# Patient Record
Sex: Female | Born: 1935
Health system: Southern US, Community
[De-identification: ages and names within clinical notes are randomized; demographics above are authoritative.]

## PROBLEM LIST (undated history)

## (undated) DIAGNOSIS — R5081 Fever presenting with conditions classified elsewhere: Secondary | ICD-10-CM

## (undated) DIAGNOSIS — A0472 Enterocolitis due to Clostridium difficile, not specified as recurrent: Secondary | ICD-10-CM

## (undated) DIAGNOSIS — N762 Acute vulvitis: Secondary | ICD-10-CM

## (undated) DIAGNOSIS — I10 Essential (primary) hypertension: Secondary | ICD-10-CM

## (undated) DIAGNOSIS — T4145XA Adverse effect of unspecified anesthetic, initial encounter: Secondary | ICD-10-CM

## (undated) DIAGNOSIS — M8580 Other specified disorders of bone density and structure, unspecified site: Secondary | ICD-10-CM

## (undated) DIAGNOSIS — T8859XA Other complications of anesthesia, initial encounter: Secondary | ICD-10-CM

## (undated) DIAGNOSIS — C569 Malignant neoplasm of unspecified ovary: Principal | ICD-10-CM

## (undated) DIAGNOSIS — T8489XA Other specified complication of internal orthopedic prosthetic devices, implants and grafts, initial encounter: Secondary | ICD-10-CM

## (undated) DIAGNOSIS — M810 Age-related osteoporosis without current pathological fracture: Secondary | ICD-10-CM

## (undated) DIAGNOSIS — M1990 Unspecified osteoarthritis, unspecified site: Secondary | ICD-10-CM

## (undated) DIAGNOSIS — D649 Anemia, unspecified: Secondary | ICD-10-CM

## (undated) DIAGNOSIS — Z5189 Encounter for other specified aftercare: Secondary | ICD-10-CM

## (undated) DIAGNOSIS — E871 Hypo-osmolality and hyponatremia: Secondary | ICD-10-CM

## (undated) DIAGNOSIS — E785 Hyperlipidemia, unspecified: Secondary | ICD-10-CM

## (undated) DIAGNOSIS — Z8719 Personal history of other diseases of the digestive system: Secondary | ICD-10-CM

## (undated) DIAGNOSIS — N289 Disorder of kidney and ureter, unspecified: Secondary | ICD-10-CM

## (undated) DIAGNOSIS — N952 Postmenopausal atrophic vaginitis: Secondary | ICD-10-CM

## (undated) DIAGNOSIS — B958 Unspecified staphylococcus as the cause of diseases classified elsewhere: Secondary | ICD-10-CM

## (undated) DIAGNOSIS — M86669 Other chronic osteomyelitis, unspecified tibia and fibula: Secondary | ICD-10-CM

## (undated) DIAGNOSIS — IMO0001 Reserved for inherently not codable concepts without codable children: Secondary | ICD-10-CM

## (undated) DIAGNOSIS — K219 Gastro-esophageal reflux disease without esophagitis: Secondary | ICD-10-CM

## (undated) DIAGNOSIS — R350 Frequency of micturition: Secondary | ICD-10-CM

## (undated) DIAGNOSIS — D709 Neutropenia, unspecified: Secondary | ICD-10-CM

## (undated) DIAGNOSIS — J189 Pneumonia, unspecified organism: Secondary | ICD-10-CM

## (undated) DIAGNOSIS — D62 Acute posthemorrhagic anemia: Secondary | ICD-10-CM

## (undated) DIAGNOSIS — M009 Pyogenic arthritis, unspecified: Secondary | ICD-10-CM

## (undated) HISTORY — DX: Essential (primary) hypertension: I10

## (undated) HISTORY — DX: Acute vulvitis: N76.2

## (undated) HISTORY — DX: Hypo-osmolality and hyponatremia: E87.1

## (undated) HISTORY — DX: Malignant neoplasm of unspecified ovary: C56.9

## (undated) HISTORY — DX: Neutropenia, unspecified: D70.9

## (undated) HISTORY — PX: OTHER SURGICAL HISTORY: SHX169

## (undated) HISTORY — DX: Frequency of micturition: R35.0

## (undated) HISTORY — DX: Other specified disorders of bone density and structure, unspecified site: M85.80

## (undated) HISTORY — DX: Pyogenic arthritis, unspecified: M00.9

## (undated) HISTORY — DX: Other chronic osteomyelitis, unspecified tibia and fibula: M86.669

## (undated) HISTORY — DX: Postmenopausal atrophic vaginitis: N95.2

## (undated) HISTORY — DX: Other specified complication of internal orthopedic prosthetic devices, implants and grafts, initial encounter: T84.89XA

## (undated) HISTORY — DX: Fever presenting with conditions classified elsewhere: R50.81

## (undated) HISTORY — PX: APPENDECTOMY: SHX54

## (undated) HISTORY — DX: Acute posthemorrhagic anemia: D62

## (undated) HISTORY — PX: TONSILLECTOMY: SUR1361

## (undated) HISTORY — DX: Hyperlipidemia, unspecified: E78.5

## (undated) HISTORY — DX: Unspecified staphylococcus as the cause of diseases classified elsewhere: B95.8

## (undated) HISTORY — DX: Gastro-esophageal reflux disease without esophagitis: K21.9

## (undated) HISTORY — DX: Age-related osteoporosis without current pathological fracture: M81.0

## (undated) HISTORY — PX: JOINT REPLACEMENT: SHX530

## (undated) HISTORY — DX: Enterocolitis due to Clostridium difficile, not specified as recurrent: A04.72

---

## 2000-06-26 ENCOUNTER — Other Ambulatory Visit: Admission: RE | Admit: 2000-06-26 | Discharge: 2000-06-26 | Payer: Self-pay | Admitting: Family Medicine

## 2000-11-04 ENCOUNTER — Ambulatory Visit (HOSPITAL_COMMUNITY): Admission: RE | Admit: 2000-11-04 | Discharge: 2000-11-04 | Payer: Self-pay | Admitting: Gastroenterology

## 2001-08-26 ENCOUNTER — Other Ambulatory Visit: Admission: RE | Admit: 2001-08-26 | Discharge: 2001-08-26 | Payer: Self-pay | Admitting: Family Medicine

## 2002-07-06 ENCOUNTER — Encounter: Admission: RE | Admit: 2002-07-06 | Discharge: 2002-10-04 | Payer: Self-pay | Admitting: Family Medicine

## 2002-09-05 ENCOUNTER — Other Ambulatory Visit: Admission: RE | Admit: 2002-09-05 | Discharge: 2002-09-05 | Payer: Self-pay | Admitting: Family Medicine

## 2002-10-26 ENCOUNTER — Encounter: Admission: RE | Admit: 2002-10-26 | Discharge: 2003-01-24 | Payer: Self-pay | Admitting: Family Medicine

## 2003-10-03 ENCOUNTER — Encounter: Admission: RE | Admit: 2003-10-03 | Discharge: 2003-10-03 | Payer: Self-pay | Admitting: Family Medicine

## 2003-11-28 ENCOUNTER — Encounter: Admission: RE | Admit: 2003-11-28 | Discharge: 2003-11-28 | Payer: Self-pay | Admitting: Family Medicine

## 2006-03-04 ENCOUNTER — Inpatient Hospital Stay (HOSPITAL_COMMUNITY): Admission: RE | Admit: 2006-03-04 | Discharge: 2006-03-11 | Payer: Self-pay | Admitting: Orthopedic Surgery

## 2008-02-11 DIAGNOSIS — B958 Unspecified staphylococcus as the cause of diseases classified elsewhere: Secondary | ICD-10-CM

## 2008-02-11 HISTORY — DX: Unspecified staphylococcus as the cause of diseases classified elsewhere: B95.8

## 2008-02-14 ENCOUNTER — Inpatient Hospital Stay (HOSPITAL_COMMUNITY): Admission: RE | Admit: 2008-02-14 | Discharge: 2008-02-17 | Payer: Self-pay | Admitting: Orthopedic Surgery

## 2008-07-07 ENCOUNTER — Ambulatory Visit (HOSPITAL_COMMUNITY): Admission: RE | Admit: 2008-07-07 | Discharge: 2008-07-07 | Payer: Self-pay | Admitting: Orthopedic Surgery

## 2008-08-09 ENCOUNTER — Inpatient Hospital Stay (HOSPITAL_COMMUNITY): Admission: RE | Admit: 2008-08-09 | Discharge: 2008-08-15 | Payer: Self-pay | Admitting: Orthopedic Surgery

## 2008-08-11 ENCOUNTER — Ambulatory Visit: Payer: Self-pay | Admitting: Infectious Diseases

## 2008-08-14 ENCOUNTER — Encounter (INDEPENDENT_AMBULATORY_CARE_PROVIDER_SITE_OTHER): Payer: Self-pay | Admitting: Gastroenterology

## 2008-08-28 ENCOUNTER — Encounter: Payer: Self-pay | Admitting: Infectious Diseases

## 2009-04-17 ENCOUNTER — Encounter: Payer: Self-pay | Admitting: Infectious Diseases

## 2009-04-23 ENCOUNTER — Ambulatory Visit: Payer: Self-pay | Admitting: Infectious Diseases

## 2009-04-23 DIAGNOSIS — M810 Age-related osteoporosis without current pathological fracture: Secondary | ICD-10-CM | POA: Insufficient documentation

## 2009-04-23 DIAGNOSIS — M86669 Other chronic osteomyelitis, unspecified tibia and fibula: Secondary | ICD-10-CM | POA: Insufficient documentation

## 2009-04-23 DIAGNOSIS — M949 Disorder of cartilage, unspecified: Secondary | ICD-10-CM

## 2009-04-23 DIAGNOSIS — M899 Disorder of bone, unspecified: Secondary | ICD-10-CM | POA: Insufficient documentation

## 2009-04-23 DIAGNOSIS — I10 Essential (primary) hypertension: Secondary | ICD-10-CM | POA: Insufficient documentation

## 2009-04-23 DIAGNOSIS — M1990 Unspecified osteoarthritis, unspecified site: Secondary | ICD-10-CM | POA: Insufficient documentation

## 2009-04-23 DIAGNOSIS — K219 Gastro-esophageal reflux disease without esophagitis: Secondary | ICD-10-CM | POA: Insufficient documentation

## 2009-04-23 DIAGNOSIS — E785 Hyperlipidemia, unspecified: Secondary | ICD-10-CM | POA: Insufficient documentation

## 2009-04-23 HISTORY — DX: Age-related osteoporosis without current pathological fracture: M81.0

## 2009-04-23 HISTORY — DX: Other chronic osteomyelitis, unspecified tibia and fibula: M86.669

## 2009-04-23 LAB — CONVERTED CEMR LAB
ALT: 10 units/L (ref 0–35)
AST: 17 units/L (ref 0–37)
Albumin: 3.8 g/dL (ref 3.5–5.2)
Alkaline Phosphatase: 58 units/L (ref 39–117)
BUN: 20 mg/dL (ref 6–23)
Basophils Absolute: 0 10*3/uL (ref 0.0–0.1)
Basophils Relative: 1 % (ref 0–1)
CO2: 22 meq/L (ref 19–32)
CRP: 3.4 mg/dL — ABNORMAL HIGH (ref <0.6)
Calcium: 9.4 mg/dL (ref 8.4–10.5)
Chloride: 101 meq/L (ref 96–112)
Creatinine, Ser: 0.91 mg/dL (ref 0.40–1.20)
Eosinophils Absolute: 0.3 10*3/uL (ref 0.0–0.7)
Eosinophils Relative: 4 % (ref 0–5)
Glucose, Bld: 105 mg/dL — ABNORMAL HIGH (ref 70–99)
HCT: 33.4 % — ABNORMAL LOW (ref 36.0–46.0)
Hemoglobin: 10.5 g/dL — ABNORMAL LOW (ref 12.0–15.0)
Lymphocytes Relative: 18 % (ref 12–46)
Lymphs Abs: 1.3 10*3/uL (ref 0.7–4.0)
MCHC: 31.4 g/dL (ref 30.0–36.0)
MCV: 77 fL — ABNORMAL LOW (ref >=78.0)
Monocytes Absolute: 0.7 10*3/uL (ref 0.1–1.0)
Monocytes Relative: 10 % (ref 3–12)
Neutro Abs: 4.8 10*3/uL (ref 1.7–7.7)
Neutrophils Relative %: 67 % (ref 43–77)
Platelets: 346 10*3/uL (ref 150–400)
Potassium: 5.1 meq/L (ref 3.5–5.3)
RBC: 4.34 M/uL (ref 3.87–5.11)
RDW: 17.8 % — ABNORMAL HIGH (ref 11.5–15.5)
Sed Rate: 52 mm/hr — ABNORMAL HIGH (ref 0–22)
Sodium: 135 meq/L (ref 135–145)
Total Bilirubin: 0.3 mg/dL (ref 0.3–1.2)
Total Protein: 7.4 g/dL (ref 6.0–8.3)
WBC: 7.1 10*3/uL (ref 4.0–10.5)

## 2009-04-27 ENCOUNTER — Encounter (INDEPENDENT_AMBULATORY_CARE_PROVIDER_SITE_OTHER): Payer: Self-pay | Admitting: *Deleted

## 2009-04-28 ENCOUNTER — Encounter: Admission: RE | Admit: 2009-04-28 | Discharge: 2009-04-28 | Payer: Self-pay | Admitting: Infectious Diseases

## 2009-05-07 ENCOUNTER — Inpatient Hospital Stay (HOSPITAL_COMMUNITY): Admission: EM | Admit: 2009-05-07 | Discharge: 2009-05-15 | Payer: Self-pay | Admitting: Emergency Medicine

## 2009-05-07 ENCOUNTER — Telehealth: Payer: Self-pay | Admitting: Infectious Diseases

## 2009-05-07 ENCOUNTER — Ambulatory Visit: Payer: Self-pay | Admitting: Infectious Diseases

## 2009-05-14 ENCOUNTER — Ambulatory Visit: Payer: Self-pay | Admitting: Infectious Disease

## 2009-05-20 ENCOUNTER — Emergency Department (HOSPITAL_COMMUNITY): Admission: EM | Admit: 2009-05-20 | Discharge: 2009-05-20 | Payer: Self-pay | Admitting: Emergency Medicine

## 2009-05-28 ENCOUNTER — Telehealth: Payer: Self-pay | Admitting: Infectious Disease

## 2009-05-28 DIAGNOSIS — M009 Pyogenic arthritis, unspecified: Secondary | ICD-10-CM

## 2009-05-28 DIAGNOSIS — T8489XA Other specified complication of internal orthopedic prosthetic devices, implants and grafts, initial encounter: Secondary | ICD-10-CM

## 2009-05-28 HISTORY — DX: Pyogenic arthritis, unspecified: M00.9

## 2009-05-28 HISTORY — DX: Other specified complication of internal orthopedic prosthetic devices, implants and grafts, initial encounter: T84.89XA

## 2009-05-31 ENCOUNTER — Telehealth: Payer: Self-pay | Admitting: Infectious Disease

## 2009-05-31 DIAGNOSIS — A0472 Enterocolitis due to Clostridium difficile, not specified as recurrent: Secondary | ICD-10-CM | POA: Insufficient documentation

## 2009-05-31 HISTORY — DX: Enterocolitis due to Clostridium difficile, not specified as recurrent: A04.72

## 2009-06-04 ENCOUNTER — Encounter: Payer: Self-pay | Admitting: Infectious Disease

## 2009-06-04 ENCOUNTER — Ambulatory Visit: Payer: Self-pay | Admitting: Infectious Diseases

## 2009-06-05 ENCOUNTER — Telehealth: Payer: Self-pay | Admitting: Infectious Disease

## 2009-06-11 ENCOUNTER — Encounter: Payer: Self-pay | Admitting: Infectious Disease

## 2009-06-11 ENCOUNTER — Encounter: Payer: Self-pay | Admitting: Internal Medicine

## 2009-06-13 ENCOUNTER — Encounter: Payer: Self-pay | Admitting: Infectious Disease

## 2009-06-19 ENCOUNTER — Encounter: Payer: Self-pay | Admitting: Infectious Disease

## 2009-06-27 ENCOUNTER — Encounter (INDEPENDENT_AMBULATORY_CARE_PROVIDER_SITE_OTHER): Payer: Self-pay | Admitting: Orthopedic Surgery

## 2009-06-27 ENCOUNTER — Inpatient Hospital Stay (HOSPITAL_COMMUNITY): Admission: RE | Admit: 2009-06-27 | Discharge: 2009-07-02 | Payer: Self-pay | Admitting: Orthopedic Surgery

## 2010-03-03 ENCOUNTER — Encounter: Payer: Self-pay | Admitting: Infectious Diseases

## 2010-03-03 ENCOUNTER — Encounter: Payer: Self-pay | Admitting: Endocrinology

## 2010-03-03 ENCOUNTER — Encounter: Payer: Self-pay | Admitting: Orthopedic Surgery

## 2010-03-12 NOTE — Progress Notes (Signed)
Summary: MRI/Lab results.  Phone Note Call from Patient   Caller: Spouse Summary of Call: Calling for MRI and lab results .Marland Kitchen  Pt is now in the emergency room at [facility]. Pt did not leave a phone number.  Initial call taken by: Tomasita Morrow RN,  May 07, 2009 9:53 AM

## 2010-03-12 NOTE — Progress Notes (Signed)
Summary: [facility] calling   Phone Note From Other Clinic   Caller: Mindi Junker at [facility] [phone] Summary of Call: Vanc trough 23.6 with dose of 1 gram every 12 hours.  Rec: dose change to 1.5 grams once daily. Please advise. Tomasita Morrow RN  June 05, 2009 4:59 PM sent to Dr Algis Liming via pager.   Follow-up for Phone Call        I spoke with them on Monday and they will adjust the dose. Thanks Follow-up by: Acey Lav MD,  June 06, 2009 11:45 PM

## 2010-03-12 NOTE — Letter (Signed)
Summary: Kishana Szwed  Deairra Stebbins   Imported By: Margie Billet 05/08/2009 11:27:57  _____________________________________________________________________  External Attachment:    Type:   Image     Comment:   External Document

## 2010-03-12 NOTE — Miscellaneous (Signed)
Summary: medication list update  Clinical Lists Changes  Medications: Added new medication of ALENDRONATE SODIUM 70 MG TABS (ALENDRONATE SODIUM) once weekly Added new medication of LOVAZA 1 GM CAPS (OMEGA-3-ACID ETHYL ESTERS) 2 capsules daily Added new medication of FERREX 150 150 MG CAPS (POLYSACCHARIDE IRON COMPLEX) 3 capsules daily Added new medication of LISINOPRIL 10 MG TABS (LISINOPRIL) half of 10 mg tablet Added new medication of NADOLOL 20 MG TABS (NADOLOL) half of a 20 mg tablet daily Added new medication of OMEPRAZOLE 20 MG CPDR (OMEPRAZOLE) 20 mg capsule daily Added new medication of ZOLPIDEM TARTRATE 10 MG TABS (ZOLPIDEM TARTRATE) half of 10 mg tablet for sleeplessness

## 2010-03-12 NOTE — Progress Notes (Signed)
Summary: Care Plan Oversight  Phone Note Outgoing Call   Call placed by: Acey Lav MD,  May 28, 2009 11:18 AM Details for Reason: Care Plan Oversight Summary of Call: [phone] (30 or more mins)  I have supervised home care and/or infusion therapy for this pt, including providing orders for care, review of labs and/or home health care plans, communicating with the home health care professionals and/or patient/caregivers to integrate current information into the medical treatment plan and/or adjust the medical therapy. This supervision has been provided for _32_minutes during the calendar month. Dates for this oversight April 4th thru May 3rd, 2011.   Initial call taken by: Acey Lav MD,  May 28, 2009 11:19 AM  New Problems: ARTHRITIS, SEPTIC (ICD-711.00) PROSTHETIC JOINT COMPLICATION (ICD-996.77)   New Problems: ARTHRITIS, SEPTIC (ICD-711.00) PROSTHETIC JOINT COMPLICATION (ICD-996.77)  Appended Document: Care Plan Oversight Care Plan oversight  99346(> 60 mins) I have supervised home care and/or infusion therapy for this pt, including providing orders for care, review of labs and/or home health care plans, communicating with the home health care professionals and/or patient/caregivers to integrate current information into the medical treatment plan and/or adjust the medical therapy. This supervision has been provided for during the calendar month. Dates for this oversight  05/14/09 thru 06/13/09  This replacest he prior charge as greater than used coordinating care

## 2010-03-12 NOTE — Progress Notes (Signed)
Summary: diarrhea  Phone Note Other Incoming   Caller: Dr Alusio's office Summary of Call: Nurse reporting  pt is having horrific diarrhea related to the Antibiotics.   Please advise.  Tomasita Morrow RN  May 31, 2009 9:32 AM   Follow-up for Phone Call        I had spent 30 minutes talking to the pt about her diarrhea and her surgery. Her husband will come on Monday to pickup a container to put a stool specimen in and have it checked for c diff toxins. I will also help to have her weekly wbc sent to me. I expect her diarrhea is from antibiotics themselves rather than c diff but I would rather look for c diff than ignore this possiblity. Follow-up by: Acey Lav MD,  June 02, 2009 3:40 PM  New Problems: DIARRHEA, ANTIBIOTIC ASSOCIATED (ICD-008.45)   New Problems: DIARRHEA, ANTIBIOTIC ASSOCIATED (ICD-008.45)

## 2010-03-12 NOTE — Miscellaneous (Signed)
Summary: HIPAA Restrictions  HIPAA Restrictions   Imported By: Florinda Marker 04/24/2009 09:52:45  _____________________________________________________________________  External Attachment:    Type:   Image     Comment:   External Document

## 2010-03-12 NOTE — Miscellaneous (Signed)
[facility]  [facility]: [facility]   Imported By: Florinda Marker 07/03/2009 16:33:12  _____________________________________________________________________  External Attachment:    Type:   Image     Comment:   External Document

## 2010-03-12 NOTE — Assessment & Plan Note (Signed)
Summary: hsfu need chart/inf left totlal knee/okperjh   CC:  hsfu.  History of Present Illness: 77 yo F with a history of osteoarthritis and L TKR 02-14-08. She returned with wound fluid collection 07-07-08 and had I & D. Her culture were negative. She returned again with a fluid collection and had I& D 08-09-08. She was d/c with 6 weeks of IV vanco and cefttriaxone as well as a vac. Her ESR was 49. Since has had redness around site of I and D where wound vac was. She has a skin dimple there but no open os. It cont to be tender to touch. no fevers or chills.   Preventive Screening-Counseling & Management  Alcohol-Tobacco     Alcohol drinks/day: 0     Smoking Status: never  Caffeine-Diet-Exercise     Caffeine use/day: coffee and tea occassionally     Does Patient Exercise: yes     Type of exercise: water walking     Exercise (avg: min/session): 30-60     Times/week: 3  Safety-Violence-Falls     Seat Belt Use: yes   Updated Prior Medication List: PROTONIX 40 MG PACK (PANTOPRAZOLE SODIUM) Take 1 tablet by mouth once a day CORGARD 20 MG TABS (NADOLOL) pt unsure of dose TYLENOL 325 MG TABS (ACETAMINOPHEN) prn  Current Allergies (reviewed today): ! PCN ! IODINE ! * SHELLFISH Past History:  Past Medical History: 1. Pretibial wound, status post irrigation and debridement left knee     and pretibial wound. Cx negative 2. New-onset rectal bleeding secondary to ischemic colitis diagnosed     by flexible sigmoidoscopy. 3. Mild acute blood loss anemia. 4. Postoperative diarrhea, Clostridium difficile negative. 5. History of shingles. 6. Cataracts. 7. Hypertension. 8. Hypercholesterolemia. 9.Reflux disease. 10.Past history of urinary tract infection. 11.Past history of cystitis. 12.Osteopenia. 13.Osteoporosis. 14.Postmenopausal. 15.Childhood illnesses of measles and mumps. 16.Past history of hyponatremia/syndrome of inappropriate antidiuretic     hormone following total knee in  January 2010.  Family History: father with parkinsons  Social History: Never Smoked Alcohol use-no  Review of Systems       states she has some residual difficulty with weight bearing. concerned that her leg has grown back crooked.   Vital Signs:  Patient profile:   77 year old female Height:      65 inches (165.10 cm) Weight:      185.3 pounds (84.23 kg) BMI:     30.95 Temp:     97.9 degrees F (36.61 degrees C) oral Pulse rate:   87 / minute BP sitting:   161 / 80  (left arm)  Vitals Entered By: Baxter Hire) (April 23, 2009 9:23 AM) CC: hsfu Pain Assessment Patient in pain? yes     Location: left knee Type: discomfort Onset of pain  discomfort pain when walking Nutritional Status BMI of > 30 = obese Nutritional Status Detail appetite is very good per patient  Does patient need assistance? Functional Status Self care Ambulation Normal   Physical Exam  General:  well-developed, well-nourished, and well-hydrated.   Eyes:  pupils equal, pupils round, and pupils reactive to light.   Mouth:  pharynx pink and moist and no exudates.   Neck:  no masses.   Lungs:  normal respiratory effort and normal breath sounds.   Heart:  normal rate, regular rhythm, and no murmur.   Abdomen:  soft, non-tender, and normal bowel sounds.   Extremities:  L knee is slightly warm, mildly tender, there is a small tissue defect  with fluid palpable beneath.    Current Medications (verified): 1)  Protonix 40 Mg Pack (Pantoprazole Sodium) .... Take 1 Tablet By Mouth Once A Day 2)  Corgard 20 Mg Tabs (Nadolol) .... Pt Unsure of Dose 3)  Tylenol 325 Mg Tabs (Acetaminophen) .... Prn 4)  Ferrex 150 150 Mg Caps (Polysaccharide Iron Complex) .... 3 Tabs By Mouth Once Daily  Allergies (verified): 1)  ! Pcn 2)  ! Iodine 3)  ! * Shellfish   Impression & Recommendations:  Problem # 1:  OSTEOMYELITIS, CHRONIC, LOWER LEG (ICD-730.16)  will set her up for a MRI of her LLE/knee. She  still has her prosthesis in place. Will verify with radiology that this is ok. Will also check her ESR, CBC, CRP, CMP today. Her knee has evidence of inflamation but does not appear grossly infected. SHe asks about having a nuclear study but these can be relative insensitive. will cont to take tylenol as neded. will have her back in 2 weeks.  Her updated medication list for this problem includes:    Tylenol 325 Mg Tabs (Acetaminophen) .Marland Kitchen... Prn  Orders: T-C-Reactive Protein [id number]) T-CBC w/Diff [id number]) T-Sed Rate (Automated) [id number]) T-Comprehensive Metabolic Panel [id number]) Consultation Level IV (28413) MRI without Contrast (MRI w/o Contrast)  Medications Added to Medication List This Visit: 1)  Protonix 40 Mg Pack (Pantoprazole sodium) .... Take 1 tablet by mouth once a day 2)  Corgard 20 Mg Tabs (Nadolol) .... Pt unsure of dose 3)  Tylenol 325 Mg Tabs (Acetaminophen) .... Prn 4)  Ferrex 150 150 Mg Caps (Polysaccharide iron complex) .... 3 tabs by mouth once daily Process Orders Check Orders Results:     [facility]: Check successful Tests Sent for requisitioning (April 23, 2009 10:22 AM):     04/23/2009: [facility] -- T-C-Reactive Protein [[id number]] (signed)     04/23/2009: [facility] -- T-CBC w/Diff [[id number]] (signed)     04/23/2009: [facility] -- T-Sed Rate (Automated) [[id number]] (signed)     04/23/2009: [facility] -- T-Comprehensive Metabolic Panel [phone] (signed)

## 2010-03-12 NOTE — Progress Notes (Signed)
Summary: questions about medications  Phone Note Call from Patient   Caller: Patient Call For: Patient  Summary of Call: Patient is getting labs every week with Genevieve Norlander and she had f/u appt with Dr. Despina Hick her ortho. Careers adviser. She is to see him again 1 week prior to surgery on 06/27/2009. She wants to know what would Dr. Daiva Eves like to do prior to and after surgery about her IV abx. She states that she would have a hard time getting to our office in her "shape" right now. Initial call taken by: Starleen Arms CMA,  May 28, 2009 11:47 AM  Follow-up for Phone Call        I would like her IV antibioitcs sto stop when she has completed the course. She needs to be ealvuated by ortho prior to reimplantation to make sure that she has no evidence or persistent infetion (fever, chills, increased pain etc). I can talk to Dr. Despina Hick as well if need be. He is more than capable Follow-up by: Acey Lav MD,  May 28, 2009 5:02 PM

## 2010-04-29 LAB — CBC
HCT: 23 % — ABNORMAL LOW (ref 36.0–46.0)
HCT: 28.3 % — ABNORMAL LOW (ref 36.0–46.0)
HCT: 30 % — ABNORMAL LOW (ref 36.0–46.0)
Hemoglobin: 7.8 g/dL — ABNORMAL LOW (ref 12.0–15.0)
Hemoglobin: 9.5 g/dL — ABNORMAL LOW (ref 12.0–15.0)
Hemoglobin: 9.9 g/dL — ABNORMAL LOW (ref 12.0–15.0)
MCHC: 32.9 g/dL (ref 30.0–36.0)
MCHC: 33.5 g/dL (ref 30.0–36.0)
MCHC: 34 g/dL (ref 30.0–36.0)
MCV: 81.9 fL (ref 78.0–100.0)
MCV: 84 fL (ref 78.0–100.0)
MCV: 84.1 fL (ref 78.0–100.0)
Platelets: 133 10*3/uL — ABNORMAL LOW (ref 150–400)
Platelets: 148 10*3/uL — ABNORMAL LOW (ref 150–400)
Platelets: 154 10*3/uL (ref 150–400)
RBC: 2.81 MIL/uL — ABNORMAL LOW (ref 3.87–5.11)
RBC: 3.37 MIL/uL — ABNORMAL LOW (ref 3.87–5.11)
RBC: 3.57 MIL/uL — ABNORMAL LOW (ref 3.87–5.11)
RDW: 17.5 % — ABNORMAL HIGH (ref 11.5–15.5)
RDW: 17.6 % — ABNORMAL HIGH (ref 11.5–15.5)
RDW: 17.7 % — ABNORMAL HIGH (ref 11.5–15.5)
WBC: 4.7 10*3/uL (ref 4.0–10.5)
WBC: 5.4 10*3/uL (ref 4.0–10.5)
WBC: 6 10*3/uL (ref 4.0–10.5)

## 2010-04-29 LAB — TYPE AND SCREEN
ABO/RH(D): A POS
Antibody Screen: NEGATIVE

## 2010-04-29 LAB — BASIC METABOLIC PANEL
BUN: 14 mg/dL (ref 6–23)
BUN: 8 mg/dL (ref 6–23)
BUN: 8 mg/dL (ref 6–23)
CO2: 23 mEq/L (ref 19–32)
CO2: 25 mEq/L (ref 19–32)
CO2: 27 mEq/L (ref 19–32)
Calcium: 7.5 mg/dL — ABNORMAL LOW (ref 8.4–10.5)
Calcium: 7.9 mg/dL — ABNORMAL LOW (ref 8.4–10.5)
Calcium: 8.3 mg/dL — ABNORMAL LOW (ref 8.4–10.5)
Chloride: 105 mEq/L (ref 96–112)
Chloride: 106 mEq/L (ref 96–112)
Chloride: 107 mEq/L (ref 96–112)
Creatinine, Ser: 0.87 mg/dL (ref 0.4–1.2)
Creatinine, Ser: 0.87 mg/dL (ref 0.4–1.2)
Creatinine, Ser: 0.88 mg/dL (ref 0.4–1.2)
GFR calc Af Amer: >60 mL/min (ref >60)
GFR calc Af Amer: >60 mL/min (ref >60)
GFR calc Af Amer: >60 mL/min (ref >60)
GFR calc non Af Amer: >60 mL/min (ref >60)
GFR calc non Af Amer: >60 mL/min (ref >60)
GFR calc non Af Amer: >60 mL/min (ref >60)
Glucose, Bld: 101 mg/dL — ABNORMAL HIGH (ref 70–99)
Glucose, Bld: 116 mg/dL — ABNORMAL HIGH (ref 70–99)
Glucose, Bld: 94 mg/dL (ref 70–99)
Potassium: 4 mEq/L (ref 3.5–5.1)
Potassium: 4.1 mEq/L (ref 3.5–5.1)
Potassium: 4.3 mEq/L (ref 3.5–5.1)
Sodium: 133 mEq/L — ABNORMAL LOW (ref 135–145)
Sodium: 135 mEq/L (ref 135–145)
Sodium: 136 mEq/L (ref 135–145)

## 2010-04-29 LAB — PROTIME-INR
INR: 1.22 (ref 0.00–1.49)
INR: 1.39 (ref 0.00–1.49)
INR: 1.52 — ABNORMAL HIGH (ref 0.00–1.49)
INR: 1.56 — ABNORMAL HIGH (ref 0.00–1.49)
INR: 2.19 — ABNORMAL HIGH (ref 0.00–1.49)
Prothrombin Time: 15.3 seconds — ABNORMAL HIGH (ref 11.6–15.2)
Prothrombin Time: 16.9 seconds — ABNORMAL HIGH (ref 11.6–15.2)
Prothrombin Time: 18.2 seconds — ABNORMAL HIGH (ref 11.6–15.2)
Prothrombin Time: 18.5 seconds — ABNORMAL HIGH (ref 11.6–15.2)
Prothrombin Time: 24.2 seconds — ABNORMAL HIGH (ref 11.6–15.2)

## 2010-04-29 LAB — BODY FLUID CULTURE: Culture: NO GROWTH

## 2010-04-29 LAB — ANAEROBIC CULTURE

## 2010-04-29 LAB — PREPARE RBC (CROSSMATCH)

## 2010-04-29 LAB — GRAM STAIN: Gram Stain: NONE SEEN

## 2010-04-29 LAB — SODIUM: Sodium: 139 mEq/L (ref 135–145)

## 2010-04-30 LAB — URINALYSIS, ROUTINE W REFLEX MICROSCOPIC
Bilirubin Urine: NEGATIVE
Glucose, UA: NEGATIVE mg/dL
Hgb urine dipstick: NEGATIVE
Ketones, ur: NEGATIVE mg/dL
Nitrite: NEGATIVE
Protein, ur: NEGATIVE mg/dL
Specific Gravity, Urine: 1.013 (ref 1.005–1.030)
Urobilinogen, UA: 0.2 mg/dL (ref 0.0–1.0)
pH: 7 (ref 5.0–8.0)

## 2010-04-30 LAB — COMPREHENSIVE METABOLIC PANEL
ALT: 93 U/L — ABNORMAL HIGH (ref 0–35)
AST: 56 U/L — ABNORMAL HIGH (ref 0–37)
Albumin: 3.7 g/dL (ref 3.5–5.2)
Alkaline Phosphatase: 65 U/L (ref 39–117)
BUN: 15 mg/dL (ref 6–23)
CO2: 30 mEq/L (ref 19–32)
Calcium: 9.8 mg/dL (ref 8.4–10.5)
Chloride: 98 mEq/L (ref 96–112)
Creatinine, Ser: 1.11 mg/dL (ref 0.4–1.2)
GFR calc Af Amer: 58 mL/min — ABNORMAL LOW (ref >60)
GFR calc non Af Amer: 48 mL/min — ABNORMAL LOW (ref >60)
Glucose, Bld: 111 mg/dL — ABNORMAL HIGH (ref 70–99)
Potassium: 4.3 mEq/L (ref 3.5–5.1)
Sodium: 134 mEq/L — ABNORMAL LOW (ref 135–145)
Total Bilirubin: 0.6 mg/dL (ref 0.3–1.2)
Total Protein: 7.1 g/dL (ref 6.0–8.3)

## 2010-04-30 LAB — APTT: aPTT: 24 seconds (ref 24–37)

## 2010-04-30 LAB — CBC
HCT: 33.7 % — ABNORMAL LOW (ref 36.0–46.0)
Hemoglobin: 11 g/dL — ABNORMAL LOW (ref 12.0–15.0)
MCHC: 32.7 g/dL (ref 30.0–36.0)
MCV: 81.8 fL (ref 78.0–100.0)
Platelets: 218 10*3/uL (ref 150–400)
RBC: 4.12 MIL/uL (ref 3.87–5.11)
RDW: 18 % — ABNORMAL HIGH (ref 11.5–15.5)
WBC: 4.3 10*3/uL (ref 4.0–10.5)

## 2010-04-30 LAB — PROTIME-INR
INR: 1.1 (ref 0.00–1.49)
Prothrombin Time: 14.1 seconds (ref 11.6–15.2)

## 2010-05-01 LAB — TROPONIN I
Troponin I: <0.01 ng/mL (ref 0.00–0.06)
Troponin I: <0.01 ng/mL (ref 0.00–0.06)

## 2010-05-01 LAB — CBC
HCT: 27.9 % — ABNORMAL LOW (ref 36.0–46.0)
HCT: 28.9 % — ABNORMAL LOW (ref 36.0–46.0)
HCT: 31.3 % — ABNORMAL LOW (ref 36.0–46.0)
HCT: 30.8 % — ABNORMAL LOW (ref 36.0–46.0)
Hemoglobin: 10.3 g/dL — ABNORMAL LOW (ref 12.0–15.0)
Hemoglobin: 9.2 g/dL — ABNORMAL LOW (ref 12.0–15.0)
Hemoglobin: 9.5 g/dL — ABNORMAL LOW (ref 12.0–15.0)
Hemoglobin: 10.1 g/dL — ABNORMAL LOW (ref 12.0–15.0)
MCHC: 32.8 g/dL (ref 30.0–36.0)
MCHC: 32.9 g/dL (ref 30.0–36.0)
MCHC: 33 g/dL (ref 30.0–36.0)
MCHC: 32.9 g/dL (ref 30.0–36.0)
MCV: 79.3 fL (ref 78.0–100.0)
MCV: 79.7 fL (ref 78.0–100.0)
MCV: 80.2 fL (ref 78.0–100.0)
MCV: 80.1 fL (ref 78.0–100.0)
Platelets: 202 10*3/uL (ref 150–400)
Platelets: 204 10*3/uL (ref 150–400)
Platelets: 231 10*3/uL (ref 150–400)
Platelets: 272 10*3/uL (ref 150–400)
RBC: 3.52 MIL/uL — ABNORMAL LOW (ref 3.87–5.11)
RBC: 3.63 MIL/uL — ABNORMAL LOW (ref 3.87–5.11)
RBC: 3.9 MIL/uL (ref 3.87–5.11)
RBC: 3.84 MIL/uL — ABNORMAL LOW (ref 3.87–5.11)
RDW: 18.1 % — ABNORMAL HIGH (ref 11.5–15.5)
RDW: 18.7 % — ABNORMAL HIGH (ref 11.5–15.5)
RDW: 19.1 % — ABNORMAL HIGH (ref 11.5–15.5)
RDW: 19.8 % — ABNORMAL HIGH (ref 11.5–15.5)
WBC: 4.4 10*3/uL (ref 4.0–10.5)
WBC: 5.1 10*3/uL (ref 4.0–10.5)
WBC: 5.1 10*3/uL (ref 4.0–10.5)
WBC: 6.9 10*3/uL (ref 4.0–10.5)

## 2010-05-01 LAB — BASIC METABOLIC PANEL
BUN: 10 mg/dL (ref 6–23)
BUN: 11 mg/dL (ref 6–23)
BUN: 8 mg/dL (ref 6–23)
CO2: 24 mEq/L (ref 19–32)
CO2: 24 mEq/L (ref 19–32)
CO2: 29 mEq/L (ref 19–32)
Calcium: 7.5 mg/dL — ABNORMAL LOW (ref 8.4–10.5)
Calcium: 7.7 mg/dL — ABNORMAL LOW (ref 8.4–10.5)
Calcium: 8.8 mg/dL (ref 8.4–10.5)
Chloride: 103 mEq/L (ref 96–112)
Chloride: 104 mEq/L (ref 96–112)
Chloride: 104 mEq/L (ref 96–112)
Creatinine, Ser: 0.77 mg/dL (ref 0.4–1.2)
Creatinine, Ser: 0.79 mg/dL (ref 0.4–1.2)
Creatinine, Ser: 1 mg/dL (ref 0.4–1.2)
GFR calc Af Amer: >60 mL/min (ref >60)
GFR calc Af Amer: >60 mL/min (ref >60)
GFR calc Af Amer: >60 mL/min (ref >60)
GFR calc non Af Amer: 54 mL/min — ABNORMAL LOW (ref >60)
GFR calc non Af Amer: >60 mL/min (ref >60)
GFR calc non Af Amer: >60 mL/min (ref >60)
Glucose, Bld: 104 mg/dL — ABNORMAL HIGH (ref 70–99)
Glucose, Bld: 113 mg/dL — ABNORMAL HIGH (ref 70–99)
Glucose, Bld: 114 mg/dL — ABNORMAL HIGH (ref 70–99)
Potassium: 3.7 mEq/L (ref 3.5–5.1)
Potassium: 4.1 mEq/L (ref 3.5–5.1)
Potassium: 4.4 mEq/L (ref 3.5–5.1)
Sodium: 132 mEq/L — ABNORMAL LOW (ref 135–145)
Sodium: 132 mEq/L — ABNORMAL LOW (ref 135–145)
Sodium: 136 mEq/L (ref 135–145)

## 2010-05-01 LAB — URINALYSIS, ROUTINE W REFLEX MICROSCOPIC
Bilirubin Urine: NEGATIVE
Bilirubin Urine: NEGATIVE
Glucose, UA: NEGATIVE mg/dL
Glucose, UA: NEGATIVE mg/dL
Hgb urine dipstick: NEGATIVE
Hgb urine dipstick: NEGATIVE
Ketones, ur: NEGATIVE mg/dL
Ketones, ur: NEGATIVE mg/dL
Nitrite: NEGATIVE
Nitrite: NEGATIVE
Protein, ur: NEGATIVE mg/dL
Protein, ur: NEGATIVE mg/dL
Specific Gravity, Urine: 1.008 (ref 1.005–1.030)
Specific Gravity, Urine: 1.01 (ref 1.005–1.030)
Urobilinogen, UA: 0.2 mg/dL (ref 0.0–1.0)
Urobilinogen, UA: 0.2 mg/dL (ref 0.0–1.0)
pH: 7 (ref 5.0–8.0)
pH: 6.5 (ref 5.0–8.0)

## 2010-05-01 LAB — CK TOTAL AND CKMB (NOT AT ARMC)
CK, MB: 0.9 ng/mL (ref 0.3–4.0)
CK, MB: 0.9 ng/mL (ref 0.3–4.0)
Relative Index: INVALID (ref 0.0–2.5)
Relative Index: INVALID (ref 0.0–2.5)
Total CK: 23 U/L (ref 7–177)
Total CK: 24 U/L (ref 7–177)

## 2010-05-01 LAB — COMPREHENSIVE METABOLIC PANEL
ALT: 41 U/L — ABNORMAL HIGH (ref 0–35)
AST: 41 U/L — ABNORMAL HIGH (ref 0–37)
Albumin: 2.9 g/dL — ABNORMAL LOW (ref 3.5–5.2)
Alkaline Phosphatase: 64 U/L (ref 39–117)
BUN: 12 mg/dL (ref 6–23)
CO2: 27 mEq/L (ref 19–32)
Calcium: 9 mg/dL (ref 8.4–10.5)
Chloride: 98 mEq/L (ref 96–112)
Creatinine, Ser: 0.88 mg/dL (ref 0.4–1.2)
GFR calc Af Amer: >60 mL/min (ref >60)
GFR calc non Af Amer: >60 mL/min (ref >60)
Glucose, Bld: 154 mg/dL — ABNORMAL HIGH (ref 70–99)
Potassium: 4.1 mEq/L (ref 3.5–5.1)
Sodium: 130 mEq/L — ABNORMAL LOW (ref 135–145)
Total Bilirubin: 0.7 mg/dL (ref 0.3–1.2)
Total Protein: 6.5 g/dL (ref 6.0–8.3)

## 2010-05-01 LAB — URINE CULTURE
Colony Count: NO GROWTH
Colony Count: NO GROWTH
Culture: NO GROWTH
Culture: NO GROWTH
Special Requests: POSITIVE

## 2010-05-01 LAB — PROTIME-INR
INR: 1.23 (ref 0.00–1.49)
INR: 1.28 (ref 0.00–1.49)
INR: 1.28 (ref 0.00–1.49)
INR: 1.45 (ref 0.00–1.49)
INR: 1.46 (ref 0.00–1.49)
INR: 1.36 (ref 0.00–1.49)
Prothrombin Time: 15.4 seconds — ABNORMAL HIGH (ref 11.6–15.2)
Prothrombin Time: 15.9 seconds — ABNORMAL HIGH (ref 11.6–15.2)
Prothrombin Time: 15.9 seconds — ABNORMAL HIGH (ref 11.6–15.2)
Prothrombin Time: 17.5 seconds — ABNORMAL HIGH (ref 11.6–15.2)
Prothrombin Time: 17.6 seconds — ABNORMAL HIGH (ref 11.6–15.2)
Prothrombin Time: 16.7 seconds — ABNORMAL HIGH (ref 11.6–15.2)

## 2010-05-01 LAB — DIFFERENTIAL
Basophils Absolute: 0 10*3/uL (ref 0.0–0.1)
Basophils Relative: 0 % (ref 0–1)
Eosinophils Absolute: 0.2 10*3/uL (ref 0.0–0.7)
Eosinophils Relative: 2 % (ref 0–5)
Lymphocytes Relative: 13 % (ref 12–46)
Lymphs Abs: 0.9 10*3/uL (ref 0.7–4.0)
Monocytes Absolute: 0.8 10*3/uL (ref 0.1–1.0)
Monocytes Relative: 12 % (ref 3–12)
Neutro Abs: 5 10*3/uL (ref 1.7–7.7)
Neutrophils Relative %: 73 % (ref 43–77)

## 2010-05-01 LAB — VANCOMYCIN, TROUGH
Vancomycin Tr: 12.5 ug/mL (ref 10.0–20.0)
Vancomycin Tr: 20.6 ug/mL — ABNORMAL HIGH (ref 10.0–20.0)

## 2010-05-05 LAB — CROSSMATCH
ABO/RH(D): A POS
Antibody Screen: NEGATIVE

## 2010-05-05 LAB — BASIC METABOLIC PANEL
BUN: 16 mg/dL (ref 6–23)
CO2: 25 mEq/L (ref 19–32)
Calcium: 9 mg/dL (ref 8.4–10.5)
Chloride: 104 mEq/L (ref 96–112)
Creatinine, Ser: 0.89 mg/dL (ref 0.4–1.2)
GFR calc Af Amer: >60 mL/min (ref >60)
GFR calc non Af Amer: >60 mL/min (ref >60)
Glucose, Bld: 134 mg/dL — ABNORMAL HIGH (ref 70–99)
Potassium: 4.2 mEq/L (ref 3.5–5.1)
Sodium: 135 mEq/L (ref 135–145)

## 2010-05-05 LAB — COMPREHENSIVE METABOLIC PANEL
ALT: 10 U/L (ref 0–35)
AST: 20 U/L (ref 0–37)
Albumin: 2.7 g/dL — ABNORMAL LOW (ref 3.5–5.2)
Alkaline Phosphatase: 47 U/L (ref 39–117)
BUN: 15 mg/dL (ref 6–23)
CO2: 24 mEq/L (ref 19–32)
Calcium: 8 mg/dL — ABNORMAL LOW (ref 8.4–10.5)
Chloride: 110 mEq/L (ref 96–112)
Creatinine, Ser: 0.93 mg/dL (ref 0.4–1.2)
GFR calc Af Amer: >60 mL/min (ref >60)
GFR calc non Af Amer: 58 mL/min — ABNORMAL LOW (ref >60)
Glucose, Bld: 167 mg/dL — ABNORMAL HIGH (ref 70–99)
Potassium: 4.5 mEq/L (ref 3.5–5.1)
Sodium: 137 mEq/L (ref 135–145)
Total Bilirubin: 0.6 mg/dL (ref 0.3–1.2)
Total Protein: 6.2 g/dL (ref 6.0–8.3)

## 2010-05-05 LAB — BODY FLUID CULTURE: Culture: NO GROWTH

## 2010-05-05 LAB — ANAEROBIC CULTURE

## 2010-05-05 LAB — TISSUE CULTURE

## 2010-05-05 LAB — DIFFERENTIAL
Basophils Absolute: 0 10*3/uL (ref 0.0–0.1)
Basophils Relative: 1 % (ref 0–1)
Eosinophils Absolute: 0.1 10*3/uL (ref 0.0–0.7)
Eosinophils Relative: 2 % (ref 0–5)
Lymphocytes Relative: 15 % (ref 12–46)
Lymphs Abs: 0.9 10*3/uL (ref 0.7–4.0)
Monocytes Absolute: 0.7 10*3/uL (ref 0.1–1.0)
Monocytes Relative: 11 % (ref 3–12)
Neutro Abs: 4.3 10*3/uL (ref 1.7–7.7)
Neutrophils Relative %: 72 % (ref 43–77)

## 2010-05-05 LAB — WOUND CULTURE: Culture: NO GROWTH

## 2010-05-05 LAB — CBC
HCT: 27 % — ABNORMAL LOW (ref 36.0–46.0)
HCT: 31.6 % — ABNORMAL LOW (ref 36.0–46.0)
Hemoglobin: 10.5 g/dL — ABNORMAL LOW (ref 12.0–15.0)
Hemoglobin: 8.7 g/dL — ABNORMAL LOW (ref 12.0–15.0)
MCHC: 32.2 g/dL (ref 30.0–36.0)
MCHC: 33.3 g/dL (ref 30.0–36.0)
MCV: 77 fL — ABNORMAL LOW (ref 78.0–100.0)
MCV: 78.4 fL (ref 78.0–100.0)
Platelets: 255 10*3/uL (ref 150–400)
Platelets: 285 10*3/uL (ref 150–400)
RBC: 3.44 MIL/uL — ABNORMAL LOW (ref 3.87–5.11)
RBC: 4.11 MIL/uL (ref 3.87–5.11)
RDW: 17.5 % — ABNORMAL HIGH (ref 11.5–15.5)
RDW: 18 % — ABNORMAL HIGH (ref 11.5–15.5)
WBC: 6 10*3/uL (ref 4.0–10.5)
WBC: 8.3 10*3/uL (ref 4.0–10.5)

## 2010-05-05 LAB — SEDIMENTATION RATE: Sed Rate: 47 mm/hr — ABNORMAL HIGH (ref 0–22)

## 2010-05-05 LAB — CULTURE, BLOOD (ROUTINE X 2)
Culture: NO GROWTH
Culture: NO GROWTH

## 2010-05-05 LAB — PROTIME-INR
INR: 1.15 (ref 0.00–1.49)
INR: 1.27 (ref 0.00–1.49)
Prothrombin Time: 14.6 seconds (ref 11.6–15.2)
Prothrombin Time: 15.8 seconds — ABNORMAL HIGH (ref 11.6–15.2)

## 2010-05-05 LAB — APTT: aPTT: 32 seconds (ref 24–37)

## 2010-05-05 LAB — C-REACTIVE PROTEIN: CRP: 2.3 mg/dL — ABNORMAL HIGH (ref <0.6)

## 2010-05-19 LAB — CBC
HCT: 27.8 % — ABNORMAL LOW (ref 36.0–46.0)
HCT: 28.7 % — ABNORMAL LOW (ref 36.0–46.0)
HCT: 30.5 % — ABNORMAL LOW (ref 36.0–46.0)
HCT: 30.9 % — ABNORMAL LOW (ref 36.0–46.0)
HCT: 32.2 % — ABNORMAL LOW (ref 36.0–46.0)
Hemoglobin: 10.1 g/dL — ABNORMAL LOW (ref 12.0–15.0)
Hemoglobin: 10.5 g/dL — ABNORMAL LOW (ref 12.0–15.0)
Hemoglobin: 10.8 g/dL — ABNORMAL LOW (ref 12.0–15.0)
Hemoglobin: 9.2 g/dL — ABNORMAL LOW (ref 12.0–15.0)
Hemoglobin: 9.5 g/dL — ABNORMAL LOW (ref 12.0–15.0)
MCHC: 33.1 g/dL (ref 30.0–36.0)
MCHC: 33.2 g/dL (ref 30.0–36.0)
MCHC: 33.2 g/dL (ref 30.0–36.0)
MCHC: 33.4 g/dL (ref 30.0–36.0)
MCHC: 33.8 g/dL (ref 30.0–36.0)
MCV: 78 fL (ref 78.0–100.0)
MCV: 78.2 fL (ref 78.0–100.0)
MCV: 78.7 fL (ref 78.0–100.0)
MCV: 79 fL (ref 78.0–100.0)
MCV: 79.1 fL (ref 78.0–100.0)
Platelets: 267 10*3/uL (ref 150–400)
Platelets: 273 10*3/uL (ref 150–400)
Platelets: 275 10*3/uL (ref 150–400)
Platelets: 292 10*3/uL (ref 150–400)
Platelets: 313 10*3/uL (ref 150–400)
RBC: 3.56 MIL/uL — ABNORMAL LOW (ref 3.87–5.11)
RBC: 3.65 MIL/uL — ABNORMAL LOW (ref 3.87–5.11)
RBC: 3.85 MIL/uL — ABNORMAL LOW (ref 3.87–5.11)
RBC: 3.95 MIL/uL (ref 3.87–5.11)
RBC: 4.08 MIL/uL (ref 3.87–5.11)
RDW: 17.8 % — ABNORMAL HIGH (ref 11.5–15.5)
RDW: 18 % — ABNORMAL HIGH (ref 11.5–15.5)
RDW: 18.1 % — ABNORMAL HIGH (ref 11.5–15.5)
RDW: 18.1 % — ABNORMAL HIGH (ref 11.5–15.5)
RDW: 18.3 % — ABNORMAL HIGH (ref 11.5–15.5)
WBC: 12.8 10*3/uL — ABNORMAL HIGH (ref 4.0–10.5)
WBC: 13.8 10*3/uL — ABNORMAL HIGH (ref 4.0–10.5)
WBC: 7.4 10*3/uL (ref 4.0–10.5)
WBC: 8.5 10*3/uL (ref 4.0–10.5)
WBC: 9.1 10*3/uL (ref 4.0–10.5)

## 2010-05-19 LAB — URINALYSIS, ROUTINE W REFLEX MICROSCOPIC
Bilirubin Urine: NEGATIVE
Glucose, UA: NEGATIVE mg/dL
Hgb urine dipstick: NEGATIVE
Ketones, ur: NEGATIVE mg/dL
Nitrite: NEGATIVE
Protein, ur: NEGATIVE mg/dL
Specific Gravity, Urine: 1.003 — ABNORMAL LOW (ref 1.005–1.030)
Urobilinogen, UA: 0.2 mg/dL (ref 0.0–1.0)
pH: 6 (ref 5.0–8.0)

## 2010-05-19 LAB — BASIC METABOLIC PANEL
BUN: 10 mg/dL (ref 6–23)
BUN: 10 mg/dL (ref 6–23)
BUN: 11 mg/dL (ref 6–23)
BUN: 14 mg/dL (ref 6–23)
BUN: 18 mg/dL (ref 6–23)
BUN: 8 mg/dL (ref 6–23)
CO2: 26 mEq/L (ref 19–32)
CO2: 27 mEq/L (ref 19–32)
CO2: 27 mEq/L (ref 19–32)
CO2: 27 mEq/L (ref 19–32)
CO2: 27 mEq/L (ref 19–32)
CO2: 28 mEq/L (ref 19–32)
Calcium: 8.4 mg/dL (ref 8.4–10.5)
Calcium: 8.4 mg/dL (ref 8.4–10.5)
Calcium: 8.6 mg/dL (ref 8.4–10.5)
Calcium: 8.6 mg/dL (ref 8.4–10.5)
Calcium: 8.8 mg/dL (ref 8.4–10.5)
Calcium: 9.3 mg/dL (ref 8.4–10.5)
Chloride: 102 mEq/L (ref 96–112)
Chloride: 102 mEq/L (ref 96–112)
Chloride: 102 mEq/L (ref 96–112)
Chloride: 105 mEq/L (ref 96–112)
Chloride: 105 mEq/L (ref 96–112)
Chloride: 107 mEq/L (ref 96–112)
Creatinine, Ser: 0.76 mg/dL (ref 0.4–1.2)
Creatinine, Ser: 0.77 mg/dL (ref 0.4–1.2)
Creatinine, Ser: 0.78 mg/dL (ref 0.4–1.2)
Creatinine, Ser: 0.78 mg/dL (ref 0.4–1.2)
Creatinine, Ser: 0.92 mg/dL (ref 0.4–1.2)
Creatinine, Ser: 1.01 mg/dL (ref 0.4–1.2)
GFR calc Af Amer: >60 mL/min (ref >60)
GFR calc Af Amer: >60 mL/min (ref >60)
GFR calc Af Amer: >60 mL/min (ref >60)
GFR calc Af Amer: >60 mL/min (ref >60)
GFR calc Af Amer: >60 mL/min (ref >60)
GFR calc Af Amer: >60 mL/min (ref >60)
GFR calc non Af Amer: 53 mL/min — ABNORMAL LOW (ref >60)
GFR calc non Af Amer: 59 mL/min — ABNORMAL LOW (ref >60)
GFR calc non Af Amer: >60 mL/min (ref >60)
GFR calc non Af Amer: >60 mL/min (ref >60)
GFR calc non Af Amer: >60 mL/min (ref >60)
GFR calc non Af Amer: >60 mL/min (ref >60)
Glucose, Bld: 107 mg/dL — ABNORMAL HIGH (ref 70–99)
Glucose, Bld: 108 mg/dL — ABNORMAL HIGH (ref 70–99)
Glucose, Bld: 109 mg/dL — ABNORMAL HIGH (ref 70–99)
Glucose, Bld: 125 mg/dL — ABNORMAL HIGH (ref 70–99)
Glucose, Bld: 139 mg/dL — ABNORMAL HIGH (ref 70–99)
Glucose, Bld: 98 mg/dL (ref 70–99)
Potassium: 3.7 mEq/L (ref 3.5–5.1)
Potassium: 3.9 mEq/L (ref 3.5–5.1)
Potassium: 4 mEq/L (ref 3.5–5.1)
Potassium: 4 mEq/L (ref 3.5–5.1)
Potassium: 4 mEq/L (ref 3.5–5.1)
Potassium: 4.1 mEq/L (ref 3.5–5.1)
Sodium: 135 mEq/L (ref 135–145)
Sodium: 135 mEq/L (ref 135–145)
Sodium: 135 mEq/L (ref 135–145)
Sodium: 135 mEq/L (ref 135–145)
Sodium: 137 mEq/L (ref 135–145)
Sodium: 138 mEq/L (ref 135–145)

## 2010-05-19 LAB — CLOSTRIDIUM DIFFICILE EIA: C difficile Toxins A+B, EIA: NEGATIVE

## 2010-05-19 LAB — HEMOCCULT GUIAC POC 1CARD (OFFICE)
Fecal Occult Bld: POSITIVE
Fecal Occult Bld: POSITIVE

## 2010-05-19 LAB — SEDIMENTATION RATE: Sed Rate: 49 mm/hr — ABNORMAL HIGH (ref 0–22)

## 2010-05-19 LAB — VANCOMYCIN, TROUGH
Vancomycin Tr: 24.6 ug/mL — ABNORMAL HIGH (ref 10.0–20.0)
Vancomycin Tr: 7.6 ug/mL — ABNORMAL LOW (ref 10.0–20.0)

## 2010-05-19 LAB — HIGH SENSITIVITY CRP: CRP, High Sensitivity: 57.7 mg/L — ABNORMAL HIGH

## 2010-05-20 LAB — COMPREHENSIVE METABOLIC PANEL
ALT: 11 U/L (ref 0–35)
AST: 21 U/L (ref 0–37)
Albumin: 3.4 g/dL — ABNORMAL LOW (ref 3.5–5.2)
Alkaline Phosphatase: 77 U/L (ref 39–117)
BUN: 21 mg/dL (ref 6–23)
CO2: 26 mEq/L (ref 19–32)
Calcium: 9.5 mg/dL (ref 8.4–10.5)
Chloride: 105 mEq/L (ref 96–112)
Creatinine, Ser: 0.97 mg/dL (ref 0.4–1.2)
GFR calc Af Amer: >60 mL/min (ref >60)
GFR calc non Af Amer: 56 mL/min — ABNORMAL LOW (ref >60)
Glucose, Bld: 102 mg/dL — ABNORMAL HIGH (ref 70–99)
Potassium: 4.4 mEq/L (ref 3.5–5.1)
Sodium: 138 mEq/L (ref 135–145)
Total Bilirubin: 0.5 mg/dL (ref 0.3–1.2)
Total Protein: 7.2 g/dL (ref 6.0–8.3)

## 2010-05-20 LAB — ANAEROBIC CULTURE

## 2010-05-20 LAB — URINALYSIS, ROUTINE W REFLEX MICROSCOPIC
Bilirubin Urine: NEGATIVE
Glucose, UA: NEGATIVE mg/dL
Hgb urine dipstick: NEGATIVE
Ketones, ur: NEGATIVE mg/dL
Nitrite: NEGATIVE
Protein, ur: NEGATIVE mg/dL
Specific Gravity, Urine: 1.005 (ref 1.005–1.030)
Urobilinogen, UA: 0.2 mg/dL (ref 0.0–1.0)
pH: 7 (ref 5.0–8.0)

## 2010-05-20 LAB — CBC
HCT: 33.1 % — ABNORMAL LOW (ref 36.0–46.0)
Hemoglobin: 11 g/dL — ABNORMAL LOW (ref 12.0–15.0)
MCHC: 33.2 g/dL (ref 30.0–36.0)
MCV: 77.6 fL — ABNORMAL LOW (ref 78.0–100.0)
Platelets: 303 10*3/uL (ref 150–400)
RBC: 4.26 MIL/uL (ref 3.87–5.11)
RDW: 18.4 % — ABNORMAL HIGH (ref 11.5–15.5)
WBC: 10.6 10*3/uL — ABNORMAL HIGH (ref 4.0–10.5)

## 2010-05-20 LAB — TYPE AND SCREEN
ABO/RH(D): A POS
Antibody Screen: NEGATIVE

## 2010-05-20 LAB — TISSUE CULTURE
Culture: NO GROWTH
Culture: NO GROWTH

## 2010-05-20 LAB — PROTIME-INR
INR: 1.1 (ref 0.00–1.49)
Prothrombin Time: 14.6 seconds (ref 11.6–15.2)

## 2010-05-20 LAB — APTT: aPTT: 29 seconds (ref 24–37)

## 2010-05-20 LAB — WOUND CULTURE: Culture: NO GROWTH

## 2010-05-20 LAB — URINE MICROSCOPIC-ADD ON

## 2010-05-21 LAB — APTT: aPTT: 30 seconds (ref 24–37)

## 2010-05-21 LAB — COMPREHENSIVE METABOLIC PANEL
ALT: 14 U/L (ref 0–35)
AST: 23 U/L (ref 0–37)
Albumin: 3.6 g/dL (ref 3.5–5.2)
Alkaline Phosphatase: 71 U/L (ref 39–117)
BUN: 27 mg/dL — ABNORMAL HIGH (ref 6–23)
CO2: 24 mEq/L (ref 19–32)
Calcium: 9.6 mg/dL (ref 8.4–10.5)
Chloride: 103 mEq/L (ref 96–112)
Creatinine, Ser: 1.05 mg/dL (ref 0.4–1.2)
GFR calc Af Amer: >60 mL/min (ref >60)
GFR calc non Af Amer: 51 mL/min — ABNORMAL LOW (ref >60)
Glucose, Bld: 100 mg/dL — ABNORMAL HIGH (ref 70–99)
Potassium: 4.5 mEq/L (ref 3.5–5.1)
Sodium: 135 mEq/L (ref 135–145)
Total Bilirubin: 0.7 mg/dL (ref 0.3–1.2)
Total Protein: 7.6 g/dL (ref 6.0–8.3)

## 2010-05-21 LAB — URINALYSIS, ROUTINE W REFLEX MICROSCOPIC
Bilirubin Urine: NEGATIVE
Glucose, UA: NEGATIVE mg/dL
Hgb urine dipstick: NEGATIVE
Ketones, ur: NEGATIVE mg/dL
Nitrite: NEGATIVE
Protein, ur: NEGATIVE mg/dL
Specific Gravity, Urine: 1.012 (ref 1.005–1.030)
Urobilinogen, UA: 0.2 mg/dL (ref 0.0–1.0)
pH: 7 (ref 5.0–8.0)

## 2010-05-21 LAB — ANAEROBIC CULTURE

## 2010-05-21 LAB — CBC
HCT: 35 % — ABNORMAL LOW (ref 36.0–46.0)
Hemoglobin: 11.5 g/dL — ABNORMAL LOW (ref 12.0–15.0)
MCHC: 32.9 g/dL (ref 30.0–36.0)
MCV: 77.2 fL — ABNORMAL LOW (ref 78.0–100.0)
Platelets: 293 10*3/uL (ref 150–400)
RBC: 4.54 MIL/uL (ref 3.87–5.11)
RDW: 17.5 % — ABNORMAL HIGH (ref 11.5–15.5)
WBC: 7.1 10*3/uL (ref 4.0–10.5)

## 2010-05-21 LAB — WOUND CULTURE: Culture: NO GROWTH

## 2010-05-21 LAB — PROTIME-INR
INR: 1.1 (ref 0.00–1.49)
Prothrombin Time: 15 seconds (ref 11.6–15.2)

## 2010-05-27 LAB — BASIC METABOLIC PANEL
BUN: 10 mg/dL (ref 6–23)
BUN: 13 mg/dL (ref 6–23)
BUN: 8 mg/dL (ref 6–23)
CO2: 25 mEq/L (ref 19–32)
CO2: 26 mEq/L (ref 19–32)
CO2: 26 mEq/L (ref 19–32)
Calcium: 8.1 mg/dL — ABNORMAL LOW (ref 8.4–10.5)
Calcium: 8.1 mg/dL — ABNORMAL LOW (ref 8.4–10.5)
Calcium: 8.3 mg/dL — ABNORMAL LOW (ref 8.4–10.5)
Chloride: 102 mEq/L (ref 96–112)
Chloride: 102 mEq/L (ref 96–112)
Chloride: 99 mEq/L (ref 96–112)
Creatinine, Ser: 0.73 mg/dL (ref 0.4–1.2)
Creatinine, Ser: 0.8 mg/dL (ref 0.4–1.2)
Creatinine, Ser: 0.91 mg/dL (ref 0.4–1.2)
GFR calc Af Amer: >60 mL/min (ref >60)
GFR calc Af Amer: >60 mL/min (ref >60)
GFR calc Af Amer: >60 mL/min (ref >60)
GFR calc non Af Amer: >60 mL/min (ref >60)
GFR calc non Af Amer: >60 mL/min (ref >60)
GFR calc non Af Amer: >60 mL/min (ref >60)
Glucose, Bld: 109 mg/dL — ABNORMAL HIGH (ref 70–99)
Glucose, Bld: 112 mg/dL — ABNORMAL HIGH (ref 70–99)
Glucose, Bld: 132 mg/dL — ABNORMAL HIGH (ref 70–99)
Potassium: 4 mEq/L (ref 3.5–5.1)
Potassium: 4.3 mEq/L (ref 3.5–5.1)
Potassium: 4.4 mEq/L (ref 3.5–5.1)
Sodium: 130 mEq/L — ABNORMAL LOW (ref 135–145)
Sodium: 131 mEq/L — ABNORMAL LOW (ref 135–145)
Sodium: 131 mEq/L — ABNORMAL LOW (ref 135–145)

## 2010-05-27 LAB — CBC
HCT: 26 % — ABNORMAL LOW (ref 36.0–46.0)
HCT: 27.8 % — ABNORMAL LOW (ref 36.0–46.0)
HCT: 30.7 % — ABNORMAL LOW (ref 36.0–46.0)
Hemoglobin: 10.3 g/dL — ABNORMAL LOW (ref 12.0–15.0)
Hemoglobin: 8.9 g/dL — ABNORMAL LOW (ref 12.0–15.0)
Hemoglobin: 9.3 g/dL — ABNORMAL LOW (ref 12.0–15.0)
MCHC: 33.5 g/dL (ref 30.0–36.0)
MCHC: 33.6 g/dL (ref 30.0–36.0)
MCHC: 34.3 g/dL (ref 30.0–36.0)
MCV: 90 fL (ref 78.0–100.0)
MCV: 90.1 fL (ref 78.0–100.0)
MCV: 90.6 fL (ref 78.0–100.0)
Platelets: 126 10*3/uL — ABNORMAL LOW (ref 150–400)
Platelets: 130 10*3/uL — ABNORMAL LOW (ref 150–400)
Platelets: 140 10*3/uL — ABNORMAL LOW (ref 150–400)
RBC: 2.89 MIL/uL — ABNORMAL LOW (ref 3.87–5.11)
RBC: 3.07 MIL/uL — ABNORMAL LOW (ref 3.87–5.11)
RBC: 3.41 MIL/uL — ABNORMAL LOW (ref 3.87–5.11)
RDW: 14.1 % (ref 11.5–15.5)
RDW: 14.3 % (ref 11.5–15.5)
RDW: 14.5 % (ref 11.5–15.5)
WBC: 6.1 10*3/uL (ref 4.0–10.5)
WBC: 6.3 10*3/uL (ref 4.0–10.5)
WBC: 6.4 10*3/uL (ref 4.0–10.5)

## 2010-05-27 LAB — TYPE AND SCREEN
ABO/RH(D): A POS
Antibody Screen: NEGATIVE

## 2010-05-27 LAB — PROTIME-INR
INR: 1.1 (ref 0.00–1.49)
INR: 1.5 (ref 0.00–1.49)
INR: 1.7 — ABNORMAL HIGH (ref 0.00–1.49)
Prothrombin Time: 14.8 seconds (ref 11.6–15.2)
Prothrombin Time: 18.8 seconds — ABNORMAL HIGH (ref 11.6–15.2)
Prothrombin Time: 20.6 seconds — ABNORMAL HIGH (ref 11.6–15.2)

## 2010-06-25 NOTE — Op Note (Signed)
[name], [name]              ACCOUNT NO.:  [id number]   MEDICAL RECORD NO.:  [id number]          PATIENT TYPE:  INP   LOCATION:  0006                         FACILITY:  [facility]   PHYSICIAN:  Ollen Gross, M.D.    DATE OF BIRTH:  07-01-1929   DATE OF PROCEDURE:  02/14/2008  DATE OF DISCHARGE:                               OPERATIVE REPORT   PREOPERATIVE DIAGNOSIS:  Osteoarthritis left knee.   POSTOPERATIVE DIAGNOSIS:  Osteoarthritis left knee.   PROCEDURE:  Left total knee arthroplasty.   SURGEON:  Aluisio.   ASSISTANT:  Avel Peace PA-C.   ANESTHESIA:  Spinal.   ESTIMATED BLOOD LOSS:  Minimal.   DRAINS:  None..   TOURNIQUET TIME:  32 minutes at 300 mmHg.   COMPLICATIONS:  None.   CONDITION:  Stable to recovery.   PREOPERATIVE NOTE:  The patient is a 77 year old female who has end-  stage arthritis of the left knee with progressively worsening pain and  dysfunction.  She had a previous successful right total knee  arthroplasty and presents now for left total knee arthroplasty.   PROCEDURE IN DETAIL:  After successful administration of spinal  anesthetic a tourniquet was placed high on her left thigh and her left  lower extremity was prepped and draped in the usual sterile fashion.  Extremity was wrapped in Esmarch, knee flexed, tourniquet inflated to  300 mmHg.  Midline incision was made with a 10 blade through  subcutaneous tissue to the level of the extensor mechanism.  A fresh  blade is used to make a medial parapatellar arthrotomy.  Soft tissue of  the proximal medial tibia is subperiosteally elevated to the joint line  with the knife and the semimembranosus bursa with a Cobb elevator.  Soft  tissue laterally is elevated with attention being paid to avoid the  patellar tendon on tibial tubercle.  Patella subluxed laterally, knee  flexed 90 degrees, ACL and PCL removed.  Drill was used create a  starting hole in the distal femur and the canal was thoroughly  irrigated.  The 5 degree left valgus alignment guide is placed  referencing the posterior condyles. Rotations marked and the block pins  were remove 11 mm of the distal femur.  I took 11 because of preop  flexion contracture.  Distal femoral resection is made with an  oscillating saw.  Sizing block was placed and size 3 was most  appropriate.  Rotations marked at the epicondylar axis and a size 3  cutting block is placed and the anterior-posterior chamfer cuts were  made.   Tibia subluxed forward and the menisci are removed.  The extramedullary  tibial alignment guide is placed referencing proximally at the medial  aspect of the tibial tubercle and distally along the second metatarsal  axis and tibial crest.  Blocks pins were to remove 10 mm of the non-  deficient lateral side.  Tibial resection is made with an oscillating  saw.  Size 3 is the most appropriate tibial component and the proximal  tibia was prepared with the modular drill and keel punch for the size 3.  Femoral  preparation is completed the intercondylar cut.   Size 3 mobile bearing tibial trial size 3 posterior stabilized femoral  trial and a 10 mm posterior stabilized rotating platform insert trial  were placed through the 10.  Full extension was achieved with excellent  varus-valgus anterior-posterior balance throughout full range of motion.  Patella was then everted and thickness measured to be 23 mm.  Freehand  resection taken to 13 mm, 35 template is placed, lug holes were drilled,  trial patella was placed, and it tracks normally.  Osteophytes removed  off the posterior femur with the trial in place.  All trials were  removed and the cut bone surfaces are prepared with pulsatile lavage.  Cement was mixed and once ready for implantation the size 3 mobile  bearing tibial tray size 3 posterior stabilized femur and 35 patella are  cemented into place.  The patella was held with a clamp.  Trial 10-mm  inserts placed, knee  held in full extension and all extruded cement  removed.  The cement was fully hardened then the permanent 10 mm  posterior stabilized rotating platform insert is placed into the tibial  tray.  Wounds copiously irrigated with saline solution and the FloSeal  injected on the posterior capsule, mediolateral gutters and  suprapatellar area.  Moist sponge is placed and tourniquet released for  a total time of 32 minutes.  The sponges held 2 minutes and removed.  Minimal bleeding is encountered.  The bleeding that is encountered is  stopped with electrocautery.  The wounds again copiously irrigated with  saline and the arthrotomy subsequently closed with interrupted #1 PDS.  Flexion against gravity is about 140 degrees.  Subcu was closed with  interrupted 2-0 Vicryl and subcuticular running 4-0 Monocryl.  Incisions  cleaned and dried and Steri-Strips and a bulky sterile dressing are  applied.  She is then placed into a knee immobilizer, awakened and  transferred to recovery in stable condition.      Ollen Gross, M.D.  Electronically Signed     FA/MEDQ  D:  02/14/2008  T:  02/14/2008  Job:  [id number]

## 2010-06-25 NOTE — Consult Note (Signed)
[name], [name]              ACCOUNT NO.:  [id number]   MEDICAL RECORD NO.:  [id number]          PATIENT TYPE:  INP   LOCATION:  1603                         FAC[facility]   PHYSICIAN:  John C. Madilyn Fireman, M.D.    DATE OF BIRTH:  May 27, 1929   DATE OF CONSULTATION:  08/12/2008  DATE OF DISCHARGE:                                 CONSULTATION   REASON FOR CONSULTATION:  Rectal bleeding.   HISTORY OF PRESENT ILLNESS:  The patient is a 77 year old white female  admitted for pretibial wound infection, status post debridement, who  developed some rectal bleeding estimated up to a cup after she was given  2 or 3 laxatives, which resulted in lower abdominal cramping and  subsequent explosive stools.  She saw blood on 2 or 3 occasions have but  has had two bowel movements with no blood.  She had a rectal exam by the  nurse that showed no visible stool or blood.  She did have 2 occult heme-  positive stools since the event was noted.  Her hemoglobin this morning  was 10.5, as compared to 10.8 yesterday and 11 two days earlier.  She  had a colonoscopy by me in 2002 which showed only small internal  hemorrhoids.  She is no longer having abdominal pain or tenderness.   PAST MEDICAL HISTORY:  Shingles cataracts, hypertension,  hypercholesterolemia, gastroesophageal reflux, osteoporosis, history of  urinary tract infections.   SURGERIES:  Total knee replacement 2007, appendectomy.   FAMILY HISTORY:  Negative for GI malignancy.   SOCIAL HISTORY:  The patient is married.  She denies alcohol or tobacco  use.   PHYSICAL EXAMINATION:  Well-developed and well-nourished white female in  no acute stress.  Color good.  HEART:  Regular rate and rhythm.  ABDOMEN:  Soft, non tender with normoactive bowel sounds.  No  hepatosplenomegaly, mass or guarding.   IMPRESSION:  Self-limiting rectal bleeding probably due to a combination  of explosive bowel movement and Lovenox.  It seems to be  self-limiting  at this time.   PLAN:  Since that has been 8 years, we will pursue colonoscopy, but both  she favor doing electively as an outpatient.  Our office will contact  her for this after she is discharged.  Okay to resume anticoagulation  from my standpoint.           ______________________________  Everardo All. Madilyn Fireman, M.D.     JCH/MEDQ  D:  08/12/2008  T:  08/12/2008  Job:  [id number]

## 2010-06-25 NOTE — Op Note (Signed)
[name], [name]              ACCOUNT NO.:  [id number]   MEDICAL RECORD NO.:  [id number]          PATIENT TYPE:  INP   LOCATION:  1603                         [facility]ACILITY:  [facility]   PHYSICIAN:  John C. Madilyn Fireman, M.D.    DATE OF BIRTH:  1929/09/02   DATE OF PROCEDURE:  08/14/2008  DATE OF DISCHARGE:                               OPERATIVE REPORT   INDICATIONS FOR PROCEDURE:  Intermittent rectal bleeding in a  hospitalized patient status post orthopedic instrumentation.   PROCEDURE:  The patient was placed in the left lateral decubitus  position and placed on the pulse monitor with continuous low-flow oxygen  delivered by nasal cannula.  She was sedated with 37.5 IV fentanyl and 5  mg IV Versed.  The Olympus video colonoscope was inserted into the  rectum and advanced to about 80 cm in the mid transverse colon where  solid stool was encountered.  The proximal 15-20 cm of colon appeared  normal, but there was a transition to erythema, granularity and edema  with some vague ill-defined exudate or shallow ulcerations.  Overall  appearance was nonspecific that could be consistent inflammatory,  infectious or ischemic colitis.  Biopsies were taken.  There was  transition to normal mucosa at about 40 cm.  This occurred gradually  over about 10 cm.  There were sigmoid diverticula seen with no stigma of  hemorrhage.  The rectum appeared normal.  Retroflexed view of the anus  revealed no obvious internal hemorrhoids.  The scope was then withdrawn,  and the patient returned to the recovery room in stable condition.  She  tolerated the procedure well, and there were no immediate complications.   IMPRESSION:  Mild to moderate left-sided colitis, etiology unclear but  probably ischemic versus inflammatory bowel disease.  She did not have  classic appearance for pseudomembranous colitis.   PLAN:  Await clostridium difficile toxin and will go ahead and treat  with Cipro and Flagyl for now.        ______________________________  Everardo All. Madilyn Fireman, M.D.     JCH/MEDQ  D:  08/14/2008  T:  08/14/2008  Job:  [id number]   cc:   Ollen Gross, M.D.  Fax: [phone]

## 2010-06-25 NOTE — Op Note (Signed)
NAMEJERAE, [name]              ACCOUNT NO.:  [id number]   MEDICAL RECORD NO.:  [id number]          PATIENT TYPE:  AMB   LOCATION:  DAY                          FACILITY:  [facility]   PHYSICIAN:  Ollen Gross, M.D.    DATE OF BIRTH:  [birth year]   DATE OF PROCEDURE:  07/07/2008  DATE OF DISCHARGE:  07/07/2008                               OPERATIVE REPORT   PREOPERATIVE DIAGNOSIS:  Left leg abscess.   POSTOPERATIVE DIAGNOSIS:  Left leg abscess.   PROCEDURE:  Left leg incision and drainage.   SURGEON:  Ollen Gross, M.D.   ASSISTANT:  None.   ANESTHESIA:  General.   ESTIMATED BLOOD LOSS:  Minimal.   DRAINS:  None.   COMPLICATIONS:  None.   TOURNIQUET TIME:  10 minutes at 300 mmHg.   CONDITION:  Stable to recovery.   CLINICAL NOTE:  [name] is a 77 year old female who underwent a  left total knee arthroplasty earlier this year.  She did very well and  then about a month ago started getting a little swelling over the  pretibial area.  Two weeks ago she presented with a fluctuant area.  I  opened it in the office and she had some tissues consistent with fat  necrosis.  We followed this and it got much better clinically.  By the  end of last week it was essentially healed.  Unfortunately over the  weekend it recurred.  We opened it again 3 days ago and it still has had  some persistent drainage.  She comes the operating room today for  irrigation and debridement.   PROCEDURE IN DETAIL:  After the successful administration of general  anesthetic a tourniquet was placed high on her left thigh and left lower  extremity prepped and draped in the usual sterile fashion.  Extremities  wrapped in Esmarch, tourniquet inflated 300 mmHg.  Incision was made  starting about 4 cm below the tibial tubercle, coursing over this area  and going to the tibial tubercle.  Skin was cut with the 10 blade  through the subcutaneous tissue which was dissected with a 15 blade.  There was area  of fat necrosis medial to this.  I took some specimens  and sent them for Gram stain culture and sensitivity.  The area was  delaminating off the superficial fascia off the periosteum but this did  not go down to bone.  It was about a 1 x 1 cm area and I debrided all of  the abnormal tissue starting with a knife and finishing with a rongeurs.  This did not go to her joint.  When all of the tissue was removed I  thoroughly irrigated the area with a liter of saline.  Then released the  tourniquet with total time of 10 minutes.  Minimal bleeding was  encountered.  Bleeding was stopped with electrocautery.  I tried to  approximate some deep tissue with  #1 Vicryl and then the subcu with interrupted 2-0 Vicryl and we closed  the skin with staples.  Twenty mL of 0.25% Marcaine plain injected into  the subcutaneous tissues.  The incision was cleaned and dried and a  bulky sterile dressing applied.  She was awakened and transported to  recovery in stable condition.      Ollen Gross, M.D.  Electronically Signed     FA/MEDQ  D:  07/07/2008  T:  07/08/2008  Job:  [id number]

## 2010-06-25 NOTE — Consult Note (Signed)
[name], [name]              ACCOUNT NO.:  [id number]   MEDICAL RECORD NO.:  [id number]          PATIENT TYPE:  INP   LOCATION:  1607                         FACILITY:  [facility]   PHYSICIAN:  Corinna L. Lendell Caprice, MDDATE OF BIRTH:  June 10, 1929   DATE OF CONSULTATION:  02/15/2008  DATE OF DISCHARGE:                                 CONSULTATION   REASON FOR CONSULTATION:  History of hyponatremia/SIADH.   IMPRESSIONS AND RECOMMENDATIONS:  1. Mild hyponatremia postoperatively and history of severe      hyponatremia:  The patient is no longer on diuretics which should      help.  However, I do recommend fluid restriction.  She is      tolerating a diet, and stopping IV fluids should help her blood      pressure.  I recommend monitoring daily Sodium levels.  2. Hypertension.  Continue current.  3. Gastroesophageal reflux disease on proton pump inhibitor.  4. Hyperlipidemia on Lovaza.  5. Status post left total knee on deep vein thrombosis prophylaxis.   HISTORY OF PRESENT ILLNESS:  [name] is a 77 year old white female  well known to me from previous hospitalization.  She had a total knee  replacement done yesterday, and I have been consulted to assist with  electrolyte issues.  During her last hospitalization, the patient's  sodium decreased to 114.  She ruled out for thyroid disorder or adrenal  insufficiency.  She is no longer on triamterene hydrochlorothiazide.  She is tolerating a diet and admits that she has been drinking a lot of  liquids today.   PAST MEDICAL HISTORY:  As above.   MEDICATIONS:  1. Protonix 40 mg a day.  2. Lovaza 2 grams twice a day.  3. Nadolol 10 mg a day.  4. Warfarin.  5. Alendronate has been held.  6. Lisinopril 10 mg a day.  7. Colace 10 mg twice a day.  8. Dilaudid PCA.   SOCIAL HISTORY:  Reviewed and as per previous.   FAMILY HISTORY:  Noncontributory.   REVIEW OF SYSTEMS:  As above; otherwise negative.   PHYSICAL EXAMINATION:  VITAL  SIGNS:  Temperature is 99.5, pulse 76,  respiratory rate 20, blood pressure ranging 110-160/70s, oxygen  saturation 99% on room air.  GENERAL:  The patient is a somewhat anxious appearing white female.  HEENT:  Normocephalic, atraumatic.  Pupils equal, round, reactive to  light.  Extraocular movements are intact.  Moist mucous membranes.  NECK:  Supple.  No lymphadenopathy.  No carotid bruits.  No JVD.  LUNGS:  Clear to auscultation bilaterally without wheezes, rhonchi or  rales.  CARDIOVASCULAR:  Regular rate and rhythm without murmurs, gallops or  rubs.  ABDOMEN:  Normal bowel sounds, soft, nontender, nondistended.  GU/RECTAL:  Deferred.  EXTREMITIES:  Right leg has a sequential compression device.  Left leg  is in a splint.  NEUROLOGIC:  The patient is alert and oriented but repeats questions  occasionally.  Sensory and motor exams are grossly intact.  SKIN:  No rash.   LABORATORY DATA:  Sodium today is 130, glucose is 132.  The  rest of her  BMET is unremarkable.  CBC is significant for hemoglobin of 10.3,  hematocrit 30.7, platelet count 140.  Normal white count.  EKG on  February 08, 2008 showed sinus bradycardia with a rate of 54,  nonspecific changes.  Chest x-ray on February 08, 2008 shows nothing  acute.   Thank you Dr. Lequita Halt for this consultation.  Dr. Suanne Marker will continue  to follow.      Corinna L. Lendell Caprice, MD  Electronically Signed     CLS/MEDQ  D:  02/15/2008  T:  02/16/2008  Job:  [id number]   cc:   Caryn Bee L. Little, M.D.  Fax: [phone]   Ollen Gross, M.D.  Fax: [phone]

## 2010-06-25 NOTE — Consult Note (Signed)
[name], [name]              ACCOUNT NO.:  [id number]   MEDICAL RECORD NO.:  [id number]          PATIENT TYPE:  INP   LOCATION:  1603                         FACILITY:  [facility]   PHYSICIAN:  Arne Cleveland, MD       DATE OF BIRTH:  12-Jun-1929   DATE OF CONSULTATION:  DATE OF DISCHARGE:                                 CONSULTATION   PRIMARY CARE PHYSICIAN:  Dr. Catha Gosselin.   REASON FOR CONSULTATION:  Was called to see the patient because of new  onset rectal bleeding.   HISTORY OF PRESENT ILLNESS:  [name] was recovering after having a  left pretibial wound infection.  Under inpatient surgery she had an I  and D with spacer and wound VAC that was started.  Patient had a left  total knee replacement on February 14, 2008, was doing well, but a few  weeks ago developed an abscess in the pretibial area that was treated as  an outpatient and was treated with antibiotics.  The patient initially  did well but it reoccurred.  Because of the reoccurrence and the  proximity to the knee the patient was felt by her orthopedist that she  should have a more thorough surgical debridement inpatient instead of on  an outpatient basis.  The patient had the procedure irrigation and  debridement of the left pretibial wound and left knee on August 09, 2008.  Was doing fine until early this morning when after being treated for  constipation had good results but shortly after that started noticing  some tingeing of blood after having bowel movement.  Was not initially  sure where the bleeding was coming from, a urinalysis showed no evidence  of bleeding but the light blood tinge went from that to a more active  rectal bleeding.  As the day progressed, the bleeding got worse.  The  patient had been on aspirin as prophylaxis for coronary artery disease  and Lovenox for prevention of deep venous thrombosis.   PAST MEDICAL HISTORY:  1. Gastroesophageal reflux disease.  2. Hyperlipidemia.  3.  Hypertension.  4. History of shingles.  5. History of urinary tract infections in the past.  6. History of hyponatremic episode.   PAST SURGICAL HISTORY:  1. Right total knee replacement in January 2008.  2. Left total knee replacement February 14, 2008.   FAMILY HISTORY:  Father had Parkinson's.   SOCIAL HISTORY:  Patient does not drink or use tobacco products.   ALLERGIES:  PENICILLIN, DEVELOPS HIVES; SHELLFISH, HIVES; IV CONTRAST,  HIVES.   MEDICATIONS:  Aspirin, Benadryl, Coreg, multivitamins, Protonix.  She  was also getting Motrin on a p.r.n. basis but had not taken any here in  the hospital but had taken some at home.  She was on aspirin prophylaxis  for coronary artery disease and stroke prevention, and she was started  on vancomycin for her infection and was on Lovenox for deep venous  thrombosis prophylaxis.   PHYSICAL EXAMINATION:  She is a well-developed, well-nourished, anxious  Caucasian female who appears to be in no acute distress.  VITALS:  Blood  pressure 135/69, pulse 80, respirations 18, pulse ox 97,  temperature 98.5.  HEAD:  Atraumatic/normocephalic.  EYES:  Pupils equal, round and reactive to light; disks sharp;  extraocular muscle range of motion full.  External ear, ear canal and tympanic membrane are normal.  Oropharynx  normal.  NECK:  Supple without jugular venous distention, thyromegaly or thyroid  mass.  CHEST:  Normal to inspection and palpation.  LUNGS:  Clear to auscultation and percussion.  HEART:  Regular rhythm and rate, normal S1 and S2 without murmur, gallop  or rub.  ABDOMEN:  Soft, nontender with normoactive bowel sounds, no  hepatomegaly, no splenomegaly, no palpable mass.  PELVIC:  Deferred and patient had frank blood in the collecting specimen  in the toilet.  RECTAL:  Exam was not done.  EXTREMITIES:  She had her left knee wound wrapped and she had SCDs on  both legs as part of DVT prevention.  There was no unusual rash or  lesions  noted.  NEUROLOGICAL:  Nonfocal.   LABORATORY:  Her white count on August 09, 2008 was 10.6, hemoglobin 11,  hematocrit 33.1.  Her white count on August 11, 2008 was 12.8, hemoglobin  10.8, hematocrit 32.2, sed rate was 49.  Chemistry today:  Sodium was  135, potassium 3.7 mEq, chloride 102 mEq/L, CO2 - 26 mEq/L, glucose 139  mg/dL, BUN 14, creatinine 1, calcium 9.3.   IMPRESSION:  New onset rectal bleeding, most likely in the rectosigmoid  colon, possibly due to irritation from being constipated and also being  on a blood thinner, Lovenox.   PLAN:  Discontinue her Motrin, discontinue her aspirin, discontinue her  Lovenox.  Obtain a GI consult, spoke with Dr. Madilyn Fireman.  Get a CBC and Chem-  8 in the morning.  Monitor her vitals q.4 hours.  Send stools for C.  difficile and further evaluation and treatment as indicated by hospital  course.  She will be followed in the morning by the C Team from [facility] and I spoke with Dr. Madilyn Fireman, her Gastroenterologist, and she will  be seen most likely by Dr. Madilyn Fireman tomorrow.      Arne Cleveland, MD  Electronically Signed     ML/MEDQ  D:  08/11/2008  T:  08/12/2008  Job:  [id number]

## 2010-06-25 NOTE — Op Note (Signed)
[name], [name]              ACCOUNT NO.:  [id number]   MEDICAL RECORD NO.:  [id number]          PATIENT TYPE:  INP   LOCATION:  1603                         FACILITY:  [facility]   PHYSICIAN:  Ollen Gross, M.D.    DATE OF BIRTH:  [birth year]   DATE OF PROCEDURE:  08/09/2008  DATE OF DISCHARGE:                               OPERATIVE REPORT   PREOPERATIVE DIAGNOSIS:  Left pretibial wound with questionable  infection.   POSTOPERATIVE DIAGNOSIS:  Left pretibial wound with questionable  infection.   PROCEDURE:  Irrigation and debridement, left pretibial wound and left  knee.   SURGEON:  Ollen Gross, M.D.   ASSISTANT:  Avel Peace PA-C   ANESTHESIA:  General.   BLOOD LOSS:  Minimal.   DRAIN:  VAC system.   TOURNIQUET TIME:  41 minutes at 300 mmHg.   COMPLICATIONS:  None.   CONDITION:  Stable to recovery room.   CLINICAL NOTE:  [name] is a 77 year old female who underwent a  left total knee arthroplasty several months ago.  Did very well and then  several weeks ago developed a clinical boil on the left pretibial  region.  She is treated with a superficial I and D few weeks ago and  initially healed beautifully, but then this recurred.  Given the  proximity to the knee, I felt it would be important to come back to the  operating room for a more formal deep debridement and see if it  communicates with the joint.  In the initial procedure, it did not  appear communicate with the joint.  She presents now for repeat I and D.   PROCEDURE IN DETAIL:  After successful administration of general  anesthetic, tourniquet placed high on the left thigh, left lower  extremity prepped and draped in the usual sterile fashion.  Extremity  was elevated for a minute and then the tourniquet inflated to 300 mmHg.  I made a skin cut through her previous incision for the total knee down  through subcutaneous tissue.  Just at the tibial tubercle the tissue is  necrotic.  I sent this  for Gram stain, culture and sensitivity.  I  debrided the necrotic tissue and appears to communicate with the tibia  but not going all the way to the joint.  I opened this area up and  elevated some of the fascia off the tibia.  There was a cavity under the  prosthesis but not into the joint.  This is a very unusual pattern.  The  cavity is debrided of some necrotic tissue.  It did not appear to be  purulent.  I did send this tissue also for culture.  The cavity is  outside of the cement mantle and the component is stable.  I debrided  all of that tissue out and then we again irrigated with saline.  After I  thoroughly irrigated it, I decided to inspect the interior of the joint  and with a fresh blade, made a medial parapatellar arthrotomy.  The  joint appeared clean with no evidence of any purulence and no evidence  of  any necrotic tissue.  There was some synovium which I sent for  culture also.  The synovium did not have an infected or inflamed  appearance.  I thoroughly irrigated the joint with 3 liters of saline  with pulsatile lavage.  We then thoroughly irrigated the tibial area  also where that cavity was.  I decided to fill the cavity with  gentamicin impregnated cement in case this was an area of infection.  Although it did not have the appearance, it most likely does represent  an infected cavity.  We mixed a batch of the gentamicin impregnated  cement and when it was ready for molding, I placed it into the cavity  and pressurized it into the cavity filling the entire cavity with that  cement.  Once it hardened, we irrigated again and closed the arthrotomy  with interrupted #1 PDS and closed the fascial tissue with PDS also.  There is about a 1 x 2 cm defect which does not communicate directly to  the cavity.  I closed subcu with interrupted 2-0 Vicryl leaving open an  area on top that defect.  I then placed a VAC sponge and the hooked up  the [facility] system to this to get it to  heal from inside out and to allow  any fluid to drain through that.  The tourniquet is released and then  the [facility] is hooked to suction.  The seal was intact and held the suction  well.  The bulky sterile dressing was then placed superior to the VAC.  She is then awakened and transferred to recovery in stable condition.      Ollen Gross, M.D.  Electronically Signed     FA/MEDQ  D:  08/09/2008  T:  08/10/2008  Job:  [id number]

## 2010-06-28 NOTE — Discharge Summary (Signed)
[name], [name]              ACCOUNT NO.:  [id number]   MEDICAL RECORD NO.:  [id number]          PATIENT TYPE:  INP   LOCATION:  1607                         [facility]ACILITY:  [facility]   PHYSICIAN:  Ollen Gross, M.D.    DATE OF BIRTH:  Jun 14, 1929   DATE OF ADMISSION:  02/14/2008  DATE OF DISCHARGE:  02/17/2008                               DISCHARGE SUMMARY   ADMISSION DIAGNOSES:  1. Osteoarthritis left knee.  2. Shingles.  3. Cataracts.  4. Hypertension.  5. Hypercholesterolemia.  6. Reflux disease.  7. Past history of urinary tract infections.  8. Past history of cystitis.  9. Osteopenia.  10.Osteoarthritis.  11.Postmenopausal.  12.Childhood illnesses of both measles, mumps.  13.Past history of severe hyponatremia following right total knee.   DISCHARGE DIAGNOSES:  1. Osteoarthritis left knee status post left total knee replacement      arthroplasty.  2. Postop acute blood loss anemia did not require transfusion.  3. Postop hyponatremia.  4. Shingles.  5. Cataracts.  6. Hypertension.  7. Hypercholesterolemia.  8. Reflux disease.  9. Past history of urinary tract infections.  10.Past history of cystitis.  11.Osteopenia.  12.Osteoarthritis.  13.Postmenopausal.  14.Childhood illnesses of both measles, mumps.  15.Past history of severe hyponatremia following right total knee.   PROCEDURE:  February 14, 2008 left total knee.  Surgeon Dr. Lequita Halt,  assistant Avel Peace PA-C.  Anesthesia spinal.   CONSULTS:  Medical Services, Dr. Crista Curb.   BRIEF HISTORY:  The patient is a 77 year old female with end-stage  arthritis left knee, progressive worsening pain dysfunction, status post  right total knee, now presents for a left total knee.   LABORATORY DATA:  Preop CBC showed hemoglobin 12.4, hematocrit 36.9,  white cell count 4.6, platelets 154, postop hemoglobin 10.3 then 9.3.  last H&H 8.9 and 26.0.  PT/PTT preop 14.7 and 27, respectively.  INR  1.1.  Serial  protimes followed per Coumadin protocol.  Last PT/INR 20.6  and 1.7.  Chem panel on admission slightly low albumin of 3.4.  Remaining Chem panel within normal limits.  Serial B mets were followed.  Sodium did drop from 137-130, came back up to 131 were stabilized.  Glucose went up from 97-132 back down to 109.  Preop UA negative.  Blood  group type A+.   Chest X-Ray: February 08, 2008 stable chest x-ray no active lung  disease.   EKG February 08, 2008 sinus bradycardia, nonspecific ST abnormality.  No  significant changes found, confirmed by Dr. Charlton Haws.   HOSPITAL COURSE:  The patient was admitted to [facility],  taken to OR, underwent above stated procedure without complication.  The  patient tolerated procedure well.  Later transferred to the orthopedic  floor, started on PCA and p.o. analgesic pain control following surgery.  Did have some nausea through the night of surgery and into the next  morning.  Did improve with some p.r.n. medications.  She had a severe  hyponatremia with her last hospitalization when she had a right total  knee, so we got [facility] hospitalists to assist with medical management and  severe electrolyte imbalance.  She was already noted to have a drop in  her sodium postop, we called [facility].  Blood pressure looked  good.  She was started back on her home medications.  Past history of  urinary tract infections, but her preop UA was negative.  Started  getting up out of bed on day #1, by day #2, she had been placed on fluid  restrictions by medicine on day #1, and by day #2, her sodium had come  up a little bit to 131.  She was doing much better and already felt  better as compared to her previous hospitalization.  By day two, she was  up walking about 40 feet with therapy.  She was seen.  Dressing change,  incision looked good, sodium remained stable.  Electrolytes remained  stable.  She was doing well.  By day #3, she continued to improve  with  therapy.  The patient did walk about 60 feet.  She was tolerating her  medications.  She had been weaned over p.m. medications by day #2 and by  day #3, she was on Vicodin.  Hemoglobin was a little low at 8.9, but she  was asymptomatic with this, so we just placed on iron.  Once she met her  goals, was discharged home.   DISCHARGE/PLAN:  1. The patient discharged home on February 17, 2008.  2. Discharge diagnoses, please see above.  3. Discharge medication, Coumadin, Vicodin, Robaxin, Nu-Iron.  4. Activity.  Weightbearing as tolerated.  Total knee protocol.  Home      PT, home health nursing.  5. Follow-up in 2 weeks.   DISPOSITION:  Home.   CONDITION ON DISCHARGE:  Improving.      [name], P.A.C.      Ollen Gross, M.D.  Electronically Signed    ALP/MEDQ  D:  04/04/2008  T:  04/04/2008  Job:  [id number]   cc:   Ollen Gross, M.D.  Fax: [phone]   Anna Genre Little, M.D.  Fax: [phone]   Corinna L. Lendell Caprice, MD

## 2010-06-28 NOTE — Op Note (Signed)
NAMEGENIECE, AKERS              ACCOUNT NO.:  [id number]   MEDICAL RECORD NO.:  [id number]          PATIENT TYPE:  INP   LOCATION:  E454                         FACILITY:  [facility]   PHYSICIAN:  Ollen Gross, M.D.    DATE OF BIRTH:  Oct 17, 1929   DATE OF PROCEDURE:  03/04/2006  DATE OF DISCHARGE:                               OPERATIVE REPORT   PREOPERATIVE DIAGNOSIS:  Osteoarthritis, right knee.   POSTOPERATIVE DIAGNOSIS:  Osteoarthritis, right knee.   PROCEDURE:  Right total knee arthroplasty.   SURGEON:  Ollen Gross, M.D.   ASSISTANT:  Avel Peace, PA-C.   ANESTHESIA:  General with postop Marcaine pain pump.   ESTIMATED BLOOD LOSS:  Minimal.   DRAIN:  Hemovac x1.   TOURNIQUET TIME:  39 minutes at 300 mmHg.   COMPLICATIONS:  None.   CONDITION:  Stable to recovery   BRIEF CLINICAL NOTE:  Ms. Mattice is a 77 year old female endstage  arthritis of the right knee with intractable pain.  She has failed  arthroscopic management and nonoperative management including injections  and presents now for total knee arthroplasty.   PROCEDURE IN DETAIL:  After successful administration of general  anesthetic, a tourniquet was placed high on the right thigh.  Her right  lower extremity was prepped and draped in the usual sterile fashion.  Extremity was wrapped in Esmarch, knee flexed, tourniquet inflated to  300 mmHg.  Midline incision made with a 10 blade through subcutaneous  tissue to the level of the extensor mechanism.  Fresh blade is used to  make a medial parapatellar arthrotomy.  Soft tissue over the proximal  medial tibia is subperiosteally elevated to the joint line with the  knife and at the semimembranosus bursa with a Cobb elevator.  Soft  tissue laterally is elevated with attention being paid to avoid patellar  tendon on tibial tubercle.  Patella subluxed laterally, knee flexed 90  degrees, ACL and PCL removed.  Drill was used create a starting hole and  the  distal femur canal was thoroughly irrigated.  5 degree right valgus  alignment guide is placed referencing off the posterior condyles  rotations marked and the block pinned.  Then moved 10 mm up the distal  femur.  Distal femoral resection is made with an oscillating saw.  Sizing blocks placed and size 3 is most appropriate.  Rotation is marked  off the epicondylar axis.  Size 3 cutting blocks placed and then the  anterior, posterior and chamfer cuts are made.   The tibia subluxed forward and the menisci removed.  Extramedullary  tibial alignment guide is placed referencing proximally at the medial  aspect of the tibial tubercle and distally along the second metatarsal  axis and tibial crest.  Block is pinned to remove 10 mm of the non  deficient lateral side.  Tibial resection is made with an oscillating  saw.  Sizing blocks placed, size 2.5 is most appropriate.  Then placed a  spacer blocks.  10 mm in extension and flexion showed excellent balance  throughout.  We then prepared the proximal tibia with the modular drill  and keel punch for size 2.5 and completed the femoral preparation with  the intercondylar cut for a size 3.   Size 2.5 mobile bearing tibial trial with a size 3 posterior stabilized  femoral trial and a 10 mm posterior stabilized rotating platform insert  trial were placed.  With the 10, full extension is achieved with  excellent varus and valgus balance throughout full range of motion.  The  patella was then everted and thickness measured to be 22 mm.  Freehand  resection was taken 13 mm, 35 template is placed, lug holes were  drilled, trial patella is placed and it tracks normally.  Osteophytes  removed off the posterior femur with a trial place.  All trials are  removed and the cut bone surfaces prepared with pulsatile lavage.  Cement is mixed and once ready for implantation the size 2.5 mobile  bearing tibial tray, size 3 posterior stabilized femur and 35 patella   are cemented into place and the patella is held with a clamp.  Trial 10-  mm inserts placed, knee held in full extension, all extruded cement  removed.  Once cement fully hardened, the permanent 10 mm posterior  stabilized rotating platform insert is placed into the tibial tray.  Wound is copiously irrigated with saline solution and the extensor  mechanism closed over a Hemovac drain with interrupted #1 PDS.  Flexion  against gravity is 135 degrees.  Tourniquet is released, total time of  39 minutes.  Subcu was closed interrupted 2-0 Vicryl and subcuticular  running 4-0 Monocryl.  Drain is hooked to suction and then the catheter  for the Marcaine pain pump is placed and the pump is initiated.  Steri-  Strips and a bulky sterile dressing are then applied and she is placed  in a knee immobilizer, awakened and transported to recovery in stable  condition.      Ollen Gross, M.D.  Electronically Signed     FA/MEDQ  D:  03/04/2006  T:  03/04/2006  Job:  [id number]

## 2010-06-28 NOTE — H&P (Signed)
[name], [name]              ACCOUNT NO.:  [id number]   MEDICAL RECORD NO.:  [id number]          PATIENT TYPE:  INP   LOCATION:  NA                           FACILITY:  [facility]   PHYSICIAN:  Ollen Gross, M.D.    DATE OF BIRTH:  04/17/1929   DATE OF ADMISSION:  DATE OF DISCHARGE:                              HISTORY & PHYSICAL   DATE OF ADMISSION:  February 14, 2008   CHIEF COMPLAINT:  Left knee pain.   HISTORY OF PRESENT ILLNESS:  The patient is a 77 year old female who has  been seen by Dr. Lequita Halt for ongoing left knee pain.  She had a previous  right total knee and doing well with regards to her right knee.  However, the left knee continues to be problem.  She is at a point where  she would like to ahead with surgery.  She has been seen preoperatively  by  Dr. Catha Gosselin felt to be cleared for surgery.   ALLERGIES:  1. PENICILLIN causes hives.  2. IV IODINE AND SHELLFISH cause hives although the patient is able to      tolerate topical iodine.   CURRENT MEDICATIONS:  Omeprazole, Lovasa, lisinopril, nadolol, low-dose  aspirin, multivitamin, vitamin E, calcium citrate with D, ibuprofen, and  Ambien.   PAST MEDICAL HISTORY:  1. Shingles.  2. Cataracts.  3. Hypertension.  4. Hypercholesterolemia.  5. Reflux disease.  6. History of urinary tract infections.  7. Past history of cystitis.  8. Osteopenia.  9. Osteoarthritis.  10.Postmenopausal.  11.Childhood illnesses of both measles and mumps.  12.Severe hyponatremia following previous right total knee.   PAST SURGICAL HISTORY:  1. Right total knee January 2007.  2. Tonsils at age 41.  3. Ruptured appendix with gangrene about 1995.   FAMILY HISTORY:  Father deceased age 70, Parkinson's.  Mother deceased  age 86 with complications following hysterectomy.  Brother with  pulmonary fibrosis and brother with Parkinson's.   SOCIAL HISTORY:  Married, Economist, nonsmoker.  No alcohol.  Three children.  Lives  with husband.  Does have a living will and  healthcare power attorney.   REVIEW OF SYSTEMS:  GENERAL:  No fevers, chills, night sweats.  NEURO:  No seizures, syncope or paralysis.  RESPIRATORY:  No shortness breath,  productive cough or hemoptysis.  CARDIOVASCULAR:  No chest pain.  GI: No  nausea or constipation.  GU: No dysuria, hematuria discharge.  MUSCULOSKELETAL:  Left knee.   PHYSICAL EXAMINATION:  VITAL SIGNS:  Pulse 68, respirations 14, blood  pressure 142/72.  GENERAL:  A 77 year old white female, well-nourished, well-developed, in  no acute distress.  She is alert and cooperative, mildly anxious.  Good  historian.  HEENT: Normocephalic, atraumatic.  Pupils are reactive.  Oropharynx  clear.  EOMs intact.  No glasses.  NECK:  Supple.  CHEST:  Clear.  HEART:  Regular rate and rhythm with a grade 2/6  systolic ejection  murmur best heard over the pulmonic and Erb's 1 on the  left border.  ABDOMEN:  Soft, round.  Bowel sounds present,  RECTAL/BREASTS/GENITALIA:  Not done  and not pertinent to present  illness.  EXTREMITIES:  Left knee no effusion, tender more medial and lateral,  marked crepitus noted.   IMPRESSION:  Osteoarthritis left knee.   PLAN:  The patient was admitted to [facility] to undergo a  left total knee replacement arthroplasty.  Surgery will be performed by  Dr. Ollen Gross.      [name], P.A.C.      Ollen Gross, M.D.  Electronically Signed    ALP/MEDQ  D:  02/13/2008  T:  02/14/2008  Job:  [id number]   cc:   [name], M.D.  Fax: [phone]

## 2010-06-28 NOTE — Discharge Summary (Signed)
[name], ROEMER              ACCOUNT NO.:  [id number]   MEDICAL RECORD NO.:  [id number]          PATIENT TYPE:  INP   LOCATION:  1603                         FACILITY:  [facility]   PHYSICIAN:  Ollen Gross, M.D.    DATE OF BIRTH:  [birth year]   DATE OF ADMISSION:  08/09/2008  DATE OF DISCHARGE:  08/15/2008                               DISCHARGE SUMMARY   ADMITTING DIAGNOSES:  1. Left tibial wound.  2. History of shingles.  3. Cataracts.  4. Hypertension.  5. Hypercholesterolemia.  6. Reflux disease.  7. Past history of urinary tract infection.  8. Past history of cystitis.  9. Osteopenia.  10.Osteoporosis.  11.Postmenopausal.  12.Childhood illnesses of measles and mumps.  13.Past history of hyponatremia/syndrome of inappropriate antidiuretic      hormone following total knee in January 2010.   DISCHARGE DIAGNOSES:  1. Pretibial wound, status post irrigation and debridement left knee      and pretibial wound.  2. New-onset rectal bleeding secondary to ischemic colitis diagnosed      by flexible sigmoidoscopy.  3. Mild acute blood loss anemia.  4. Pretibial abscess cultures negative.  Will treat like osteomyelitis      per infectious disease.  5. Postoperative diarrhea, Clostridium difficile negative.  6. 2.  History of shingles.  7. Cataracts.  8. Hypertension.  9. Hypercholesterolemia.  10.Reflux disease.  11.Past history of urinary tract infection.  12.Past history of cystitis.  13.Osteopenia.  14.Osteoporosis.  15.Postmenopausal.  16.Childhood illnesses of measles and mumps.  17.Past history of hyponatremia/syndrome of inappropriate antidiuretic      hormone following total knee in January 2010.   PROCEDURE:  1. On August 09, 2008, irrigation and debridement of left knee and      pretibial wounds/abscess.  Surgeon, Dr. Lequita Halt.  Assistant, Avel Peace, PA-C. Anesthesia general.  2. On August 14, 2008, flex-sig performed by Dr. Madilyn Fireman.   CONSULTANTS:  1.  Medicine consult performed by Dr. Arne Cleveland covering for Dr.      Catha Gosselin.  2. Gastroenterology consults, Dr. Dorena Cookey.  3. Infectious disease consult, Dr. Johny Sax.   BRIEF HISTORY:  The patient is a 78-year female well-known Dr. Homero Fellers  Aluisio having undergone total knee back earlier this year.  She  developed a small area with cellulitis and abscess on the leg.  She has  been followed on an outpatient basis with draining wound, developed a  boil and had a previous I and D.  Unfortunately came back and felt to be  a little deeper, felt best to be admitted for surgical debridement.   LABORATORY DATA:  CBC on admission showed a hemoglobin 11, hematocrit  33.1, white cell count 10.6, platelets 303.  Hemoglobin dropped down to  10.5 and got as low as 9.2, came back up a little bit to 10.1 and 30.5.  White count went up from 10.6-13.8 back down to 8.5.  Occult blood on  August 11, 2008, was positive x2.  Sed rate on August 11, 2008, was elevated  at 49.  PT/INR on admission 14.6 and  1.1 with PTT of 29.  Chem panel on  admission, minimally low albumin of 3.4.  Remaining Chem panel within  normal limits.  Serial BMETs were followed.  Electrolytes remained  within normal limits.  CRP on August 11, 2008, elevated at 57.7.  Vancomycin trough taken on August 11, 2008, was 7.6, vancomycin trough  taken on August 14, 2008, was 24.6.  UA on August 09, 2008 on admission,  small leukocyte esterase, only a few epithelials, 7-10 white cells.  Follow-up UA on August 11, 2008, negative.  C diff taken on August 12, 2008,  negative.  Wound culture on August 09, 2008, smear showed no organisms, no  growth.  Tissue culture taken on August 09, 2008, smear showed rare WBCs,  no squamous, no organisms seen and no growth.  Tissue culture taken on  August 09, 2008, from a cyst, rare WBCs and no organisms seen, no growth.  Anaerobic culture taken on August 09, 2008, no organisms, no anaerobes  isolated.  Portable chest  on August 10, 2008, right upper extremity PICC  line, tip in the lower SVC, no pneumothorax.   HOSPITAL COURSE:  The patient admitted to [facility], taken to  the OR and underwent the above-stated procedure without complication.  The patient tolerated the procedure well and then later transferred to  recovery room and the orthopedic floor.  She was started on IV  antibiotics and a PICC line was ordered.  PICC line was placed on postop  day #1.  Initial treatment is going to be 4 weeks of IV antibiotics,  checking the cultures.  The initial smears were negative and follow  those.  By day #2, the patient was doing a little bit better with pain  control.  Cultures were negative so far.  We consulted the wound care  team to assist with V.A.C. dressing changes.  A V.A.C. was placed on at  time of surgery.  We are going to plan to send home on IV antibiotics.  We did ask infectious disease to come by and assist in antibiotic  treatment and recommendations.  She was seen in consultation by Dr.  Johny Sax on August 11, 2008.  C-reactive protein and sed rate were  checked and elevated as above.  She was initially started on vancomycin  and ceftriaxone was added.  Unfortunately later on the evening of August 11, 2008, she started having some loose stools and noticed to have a  little bit of blood tinge in it in the toilet and did not know if it was  coming from the urine or stool.  UA was sent.  The patient had another  encounter of loose stools and had some blood-tinge noted, found to be  blood, obviously Hemoccult positive.  Gastroenterology was consulted and  the patient was seen on August 12, 2008, by Dr. Madilyn Fireman.  This was felt  initially to be due to the explosive bowel movement and laxatives while  on Lovenox felt to probably hemorrhoids.  Arrangements are being made  for Home Health.  Hemoglobin stable at that time.  The patient was  initially seen by medicine, Dr. Darnelle Catalan, who came by  and recommended  continued monitoring of the hemoglobin and hematocrit.  The patient  continued to have small bloody bowel movements, even on August 12, 2008.  The Lovenox and Coumadin were held due to the continued bloody stool.  The patient was started on aspirin, but as per GI once the bleeding  stopped it would be okay to resume the Coumadin and work on discharge  arrangements for home IV.  She continued to have a little bit more  rectal bleeding and mucusy diarrhea.  There was concern of possible C  diff, so cultures were sent.  Dr. Sampson Goon, who was covering for Dr.  Ninetta Lights evaluated the patient over the weekend and the cultures  continued to remain negative and the C diff actually came back negative  also, but felt that the patient should be treated as if she had  osteomyelitis and recommended 6 weeks of the antibiotic coverage.  Due  to the continued problems with her bowels and bloody stool, Dr. Madilyn Fireman  with GI recommended flex-sig.  The patient was prepped and on August 14, 2008, she underwent the second procedure, flex-sig with changes found to  be associated with ischemic colitis.  He felt it was okay to resume her  Lovenox if needed.  The C diff proved to be negative.  Wound care team  did change the V.A.C. while she was in the hospital.  She was continued  on the vancomycin and ceftriaxone for 6 weeks as per ID recommendation.  She remained in the hospital through the weekend and into the next week.  By August 15, 2008, her lower abdominal pain and bleeding resolved.  The C  diff was negative.  It was felt from their standpoint that she would be  ready to go.  She was stable from the medical standpoint.  Infectious  disease continued the ceftriaxone and vancomycin.  They will follow-up  in the ID clinic on an outpatient basis at the end of her treatment and  they would get a sed rate and C-reactive protein at that point.  From an  orthopedic standpoint, the V.A.C. was in place.   It was changed  yesterday and the wound care team stated that is was stable and looking  good.  Hemoglobin was back up to 10.  Rectal bleeding had stopped.  Arrangements were being made for home health and then she was discharged  home.   DISCHARGE/PLAN:  1. The patient will be discharged home on August 15, 2008.  2. Discharge diagnoses:  Please see above.  3. Discharge meds:  Vancomycin, currently on 1250 mg IV every 24      hours, 38 more doses to complete a 6-week course.  She is on baby      aspirin, Rocephin 1 gram every 24 hours, 38 more doses to complete      a 6-week course.  Vicodin and Robaxin.   FOLLOW UP:  She is to follow-up this Friday for V.A.C. change.   ACTIVITY:  Weightbearing as tolerated.   DISPOSITION:  Home.   CONDITION ON DISCHARGE:  Stable.      Alexzandrew L. Perkins, P.A.C.      Ollen Gross, M.D.  Electronically Signed    ALP/MEDQ  D:  09/22/2008  T:  09/22/2008  Job:  [id number]   cc:   Ollen Gross, M.D.  Fax: [phone]   Everardo All. Madilyn Fireman, M.D.  Fax: [phone]   Anna Genre Little, M.D.  Fax: [phone]   Lacretia Leigh. Ninetta Lights, M.D.  Fax: [phone]

## 2010-06-28 NOTE — H&P (Signed)
[name], [name]              ACCOUNT NO.:  [id number]   MEDICAL RECORD NO.:  [id number]          PATIENT TYPE:  INP   LOCATION:  1603                         [facility]ACILITY:  [facility]   PHYSICIAN:  Ollen Gross, M.D.    DATE OF BIRTH:  1930/02/11   DATE OF ADMISSION:  08/09/2008  DATE OF DISCHARGE:  08/15/2008                              HISTORY & PHYSICAL   CHIEF COMPLAINT:  Left tibia wound.   HISTORY OF PRESENT ILLNESS:  The patient is a 77 year old female well-  known to Dr. Ollen Gross, having previously undergone a total knee  back earlier this year.  She unfortunately has developed a small area  with some cellulitis and abscess on the anterior leg just below the  knee.  Has been followed on an outpatient basis, but continues to have a  little bit of draining wound.  It developed as a boil and had a previous  I and D a while back.  It has come back, though, and is felt to be a  little bit deeper, so it is felt she would best be served by admission  to the hospital for a formal I and D.   ALLERGIES:  1. PENICILLIN.  2. SHELLFISH.  3. IV IODINE.   CURRENT MEDICATIONS:  1. Aspirin.  2. Benadryl.  3. Corgard.  4. Protonix.   PAST MEDICAL HISTORY:  1. Shingles.  2. Cataracts.  3. Hypertension.  4. Hypercholesterolemia.  5. Reflux disease.  6. Past history of urinary tract infections.  7. Past history of cystitis.  8. Osteopenia.  9. Osteoarthritis.  10.Postmenopausal.  11.Childhood illnesses of measles and mumps.  12.History of hyponatremia/SIADH following total knee in January of      2010.   PAST SURGICAL HISTORY:  1. Right total knee.  2. Left total knee.  3. Tonsils.  4. Ruptured appendix.   FAMILY HISTORY:  Father deceased at age 53, Parkinson'Jennifer.  Mother  deceased age 33, complications following a hysterectomy.  Brother with  pulmonary fibrosis.  Brother with Parkinson'Jennifer.   SOCIAL HISTORY:  Married.  A realtor and Clinical research associate.  Nonsmoker.  No  alcohol.   Three children.  Lives with husband.   REVIEW OF SYSTEMS:  GENERAL:  No fevers, chills or night sweats.  NEUROLOGIC:  No seizures, syncope or paralysis.  RESPIRATORY:  No  shortness breath, productive cough or hemoptysis.  CARDIOVASCULAR:  No  chest pain or orthopnea.  GI:  No nausea, vomiting, diarrhea or  constipation.  GU:  No dysuria, hematuria or discharge.  MUSCULOSKELETAL:  Left leg.   PHYSICAL EXAMINATION:  VITAL SIGNS:  Temperature 98.6, pulse 75,  respirations 17, blood pressure 159/79.  GENERAL:  The patient is a 77 year old female, well-nourished, well-  developed, in no acute distress, mildly anxious.  Good historian.  HEENT:  Normocephalic, atraumatic.  Pupils are round and reactive.  Oropharynx is clear.  Extraocular movements intact.  NECK:  Supple.  HEART:  Regular rate and rhythm with a grade 2/6 systolic murmur best  heard over pulmonic and Erb'Jennifer point on the left border.  ABDOMEN:  Soft, slightly round.  Bowel sounds present.  RECTAL/BREAST/GENITALIA:  Not done, not pertinent to present illness.  EXTREMITIES:  Left leg with a small wound sinus tract over the proximal  tibia.  Motor function intact.  Previous incision is well healed from  previous knee.   IMPRESSION:  Abscess, pretibial wound, questionable infection.   PLAN:  The patient admitted to [facility] to undergo a formal  I and D of the left tibia.      Alexzandrew L. Perkins, P.A.C.      Ollen Gross, M.D.  Electronically Signed    ALP/MEDQ  D:  09/07/2008  T:  09/07/2008  Job:  [id number]

## 2010-06-28 NOTE — Discharge Summary (Signed)
[name], [name]              ACCOUNT NO.:  [id number]   MEDICAL RECORD NO.:  [id number]          PATIENT TYPE:  INP   LOCATION:  1410                         FACILITY:  [facility]   PHYSICIAN:  Ollen Gross, M.D.    DATE OF BIRTH:  11-14-1929   DATE OF ADMISSION:  03/04/2006  DATE OF DISCHARGE:  03/11/2006                               DISCHARGE SUMMARY   ADMITTING DIAGNOSES:  1. Osteoarthritis right knee.  2. Hypertension.  3. Reflux disease.  4. History of cystitis.  5. History of urinary tract infection.  6. Osteopenia.  7. Postmenopausal.   DISCHARGE DIAGNOSES:  1. Osteoarthritis right knee status post right total knee      arthroplasty.  2. Post-op hyponatremia, improving.  3. Acute blood loss anemia.  4. Post-op syncopal episode felt to be related to hyponatremia.   Remaining discharge diagnoses same as admitting diagnoses.   PROCEDURE:  March 04, 2006  Procedure:  Right total knee surgery.  Surgeon:  Dr. Lequita Halt.  Assistant:  Dr. Julien Girt, [facility].  Anesthesia:  General.  Tourniquet time:  39 minutes.  Consults:  Medical, [employer], Dr. Crista Curb.   BRIEF HISTORY:  [name] is a 77 year old female with end-stage  arthritis of her right knee with intractable pain, failed arthroscopic  management, non-operative management including injections now presents  for total knee.   LABORATORY DATA:  CBC on admission showed hemoglobin 12.4, hematocrit  36.3, white cell count 4.  Post-op hemoglobin done 9.2 then came back at  9.5.  Last H&H 9.6 and 27.6.  PT and PTT on admission 14.3 and 28  respectively.  INR 1.1.  Serial protimes were followed per Coumadin  Protocol.  Last noted pro-time INR 133.7 and 3.1 respectively.  Chem  panel on admission:  She had a little bit of low sodium coming in to  surgery at 134.  BUN and creatinine were 31 and 1.3 preoperatively,  remaining chem panel within normal limits with exception of low ALP of  31.  Serial BMETs  were followed.  Sodium did drop to 129, then has got  as low as 114, came back to 115, slowly drifted up 123 and last noted at  131.  Potassium did drop from 4 to 3.2, came back to 3.5.  One set of  cardiac enzymes taken on March 07, 2006:  CK normal 85, CK MB normal  at 2.1, troponin less than 0.01.  Cardiac issues:  TSH level normal at  1.634.  Cortisol level at 31.2.  Urine sodium normal at 102.  Preop UA:  Trace leukocyte esterase, few epithelials, 0-2 white cells.  Follow-up  UA March 09, 2006 negative.  Blood group type A positive.  Urine  culture:  E. coli.   X-RAYS:  V/Q scan March 08, 2006:  Single segmental perfusion defect  in the right upper lobe medially.  Lungs ventilate normally.  This  places the patient in the intermediate probability of recent PE  category.  Chest February 26, 2006:  Scar versus segmental atelectasis  right base thoraco spondylosis.   Dopplers:  Dopplers done March 10, 2006 negative as per report in  chart.  EKG March 07, 2006:  Normal sinus rhythm, prolonged Q/T.  No  old tracing to compare.  Confirmed by Dr. Charlton Haws.  Pre-op EKG  March 05, 2005:  Normal sinus rhythm, normal EKG.  This was an  unconfirmed EKG.   HOSPITAL COURSE:  The patient was admitted to [facility] and  underwent the above stated procedure without complication.  The patient  tolerated the procedure well.  Later discharged from the recovery room  to the orthopedic floor, started on PCA and p.o. analgesics for pain  control performing surgery.  Did have some discomfort during the night  of surgery and into the morning of day 1.  Hemoglobin dropped from 12.4  to 9.2.  She was a little drowsy.  Had a drop in her sodium down to 129  post-op.  Fluids were changed and the rate was reduced.  Hemovac drain  was pulled.  On the evening of day 1 Rapid Response Team was called  because of some over sedation.  The narcotics were held.  She was seen  by Rapid Response  Team, found to be responding appropriately, and did  not see any obvious neurologic changes.  The morning of day 2 she was  still a little drowsy.  Narcotics were discontinued.  Unfortunately the  sodium had drifted down even further, down to 114.  She was positive on  her fluids.  The fluids were discontinued.  She had been placed on  normal saline.  Her repeat sodium that same day was 115.  To confirm by  her Medical Assistant, Dr. Catha Gosselin, so we contacted [facility] for consult.  Dr. Crista Curb saw the patient in  consultation and felt that the hyponatremia was due to multi-reasons  including starting out with half normal saline.  She was on  hydrochlorothiazide and also syndrome of inappropriate ADH.  She also  ruled out hypothyroidism and adrenal insufficiency.  Continued on the  fluid restrictions and we held the triamterene hydrochlorothiazide.  By  day 3 she was doing a little bit better.  She was weak from the  hyponatremia but the drowsiness had improved after stopping the  narcotics.  Her sodium was still low at 115.  We resumed her physical  therapy which had been on hold.  She did have a little bit of low  potassium at this time and potassium supplement was repleted with oral  supplements.  The other labs that had been ordered looked okay.  Her  hemoglobin looked like it was stabilized.  On the evening of January 26,  Rapid Response Team was called again.  Apparently had a syncopal episode  while trying to get back to bed.  She was evaluated by Dr. Lendell Caprice.  Notes that stated that she was unresponsive for 2 minutes and denied any  chest pain or shortness of breath.  Felt the syncopal episode associated  with the electrolyte imbalance and weakness.  She was transferred to  telemetry.  A D-dimer and cardiac enzymes were ordered.  Cardiac enzymes  did not show any type of cardiac event.  Had a V/Q scan ordered which showed an intermediate possibility.   Dopplers were ordered and performed  a little bit later in the hospital course which were found to be  negative.  It was felt unlikely that she had any kind of pulmonary  event.  The sodium was continued to be followed and  managed by Dr.  Lendell Caprice.  By January 27, post-op day 4, her sodium had come up a little  bit and was back up in the 123.  Her hemoglobin had stabilized; it was  9.6.  Resumed her physical therapy which had been held after the  syncopal event.  She was much more alert.  Suppository for constipation.  Patient was placed on Nexium for some reflux.  She can resume her  therapy again and by January 28, post-op day 5, she was actually  ambulating in the hallway, doing much better with her mobility.  She was  up ambulating about 65 feet.  It was thought that the patient would need  some type of skilled facility for continued rehab.  Discharge Planning  was consulted.  They were to assist with placement of the patient.  By  post-op day 5, March 09, 2006, the patient was already sitting up in  the chair.  She had been walking in the hallway doing short distances.  Her energy and strength was slowly improving.  Her sodium continued to  improve and was back up to 128.  They were looking into skilled  facilities.  Dressing was started on day 2 and it was followed each day.  Incision looked excellent.  It was healing very well.  Minimal swelling  post-op.  The Doppler was ordered at that time just for follow-up, and  if felt to be negative at the time would not need any long term  Coumadin.  It was found that the Dopplers were negative later that day.  By the next day, by post-op day 6, overall patient clinically looked  much improved.  We were looking into [facility] as a possibility for  placement.  Her sodium had continued to improve and was back up to 131,  which is near her pre-op level.  It was felt that a lot of the  hyponatremia which was noted to be mild pre-op was  because of her  diuretic that she was on and was just complicated by the issues of  undergoing surgery.  Had a follow-up UA which was ordered on January 29  because of the history of UTIs and the Foley had been in.  That proved  to be negative on the urinalysis.  It is recommended by Dr. Lendell Caprice to  stay off the triamterene and hydrochlorothiazide indefinitely and  continue the fluid restriction for about 3-7 days, but monitor her  sodium at the skilled nursing facility periodically.  Felt there would  be no evidence of PE.  No further syncopal episodes and Dr. Lendell Caprice had  signed off.  The patient was seen on the morning of March 11, 2006.  She was doing very well.  The plans were to go to a skilled nursing  facility.  Were are working on that.  It was found later that morning  that the patient did have a bed over at [facility], that is where the  family would like to go once a bed was available.  Arrangements were being made and the patient was transferred out later that day.   DISCHARGE PLAN:  The patient was transferred over to [facility].   DISCHARGE DIAGNOSIS:  Please see above.   DISCHARGE MEDICATIONS:  She is on Coumadin protocol for 3 weeks post-op  from the date of surgery which was March 04, 2006.  Please titrate the  Coumadin level for a target INR between 2.0 and 3.0.  She  is on Colace  100 mg p.o. b.i.d., Tri-Chlor 145 mEq p.o. daily, new iron 150 mg p.o.  b.i.d.  She is on Nasonex 50 mcg nasal spray, 2 sprays daily.  Nexium 40  mg p.o. daily.  Nadolol 10 mg p.o. daily.  Ensure b.i.d. liquid.  Tylenol 1 or 2 every 4-6 hours as needed for pain.  Robaxin 500 mg p.o.  q.6-8 hours p.r.n. spasm.  Ambien 5 mg p.o. q.h.s. p.r.n. sleep.  Zofran  4 mg p.o. q.6 hours p.r.n. nausea.  She is on Vicodin 5 mg 1 tablet  every 6 hours as needed for pain.  Dulcolax suppository p.r.n.  Cipro  500 mg p.o. b.i.d. for 5 days.   Diet:  Diet as  tolerated.   Activity:  She is weightbearing as tolerated to the right lower  extremity.  Continue gait training, ambulation and ADLs as per PT and OT  for total knee protocol.  Range of motion and strengthening exercises.  Daily dressing change to the right knee.  She may start showering,  however do not submerge the incision under water.   Followup:  She needs to followup in the office about 2 weeks from  surgery.  Please contact the office at [phone] to arrange appointment  for followup for the patient.   DISPOSITION:  She will be going to [facility].  Further instructions:  It was recommended that she stay off the  triamterene hydrochlorothiazide indefinitely.  They also recommended  continued fluid restrictions for approximately 3-7 days.  That was  ordered on March 10, 2006, so would recommend continuing it for  another 2-5 days.  Recommend doing a followup sodium level at the  nursing facility.  Recommend followup BMET tomorrow, March 12, 2006.   Further note:  It was noted that the urinalysis that was ordered  postoperatively was noted to be negative; however, the urine culture did  prove to be positive prior to discharge, noted to be E. coli.  Will add  medication for this, as above.  Sensitivities are pending at the time of  discharge.      Alexzandrew L. Julien Girt, P.A.      Ollen Gross, M.D.  Electronically Signed    ALP/MEDQ  D:  03/11/2006  T:  03/11/2006  Job:  [id number]   cc:   Ollen Gross, M.D.  Fax: [phone]   Corinna L. Lendell Caprice, MD   Anna Genre. Little, M.D.  Fax: [phone]   [facility]

## 2010-06-28 NOTE — Consult Note (Signed)
[name], [name]              ACCOUNT NO.:  [id number]   MEDICAL RECORD NO.:  [id number]          PATIENT TYPE:  INP   LOCATION:  1509                         [facility]ACILITY:  [facility]   PHYSICIAN:  Corinna L. Lendell Caprice, MDDATE OF BIRTH:  August 28, 1929   DATE OF CONSULTATION:  03/06/2006  DATE OF DISCHARGE:                                 CONSULTATION   REASON FOR CONSULTATION:  Hyponatremia   REQUESTING/ATTENDING PHYSICIAN:  Ollen Gross, M.D.   IMPRESSIONS/RECOMMENDATIONS:  1. Hyponatremia suspect multifactorial:  The patient was getting D5      and half normal saline for some time; and this may have      contributed.  She also is on triamterene/hydrochlorothiazide which      can cause hyponatremia.  Also I suspect she has syndrome of      inappropriate antidiuretic hormone secretion.  I will, however,      rule out hypothyroidism or adrenal insufficiency as contributing      factors.  I agree with stopping the      triamterene/hydrochlorothiazide, Hep-Lock her IV fluids, and fluid      restriction.  I will monitor her sodium daily; and this afternoon      as well.  2. Hypertension.  We will need to monitor for elevated blood pressure      off her medication.  3. Gastroesophageal reflux disease.  4. Postoperative day #2 status post right total knee arthroplasty.   HISTORY OF PRESENT ILLNESS:  [name] is a pleasant 77 year old  white female patient who has been admitted to Dr. Deri Fuelling service  after having had a total knee arthroplasty 2 days ago.  Her primary care  physician is Dr. Catha Gosselin.  She had a sodium level of 129 yesterday;  and this morning it was 114.  It was confirmed by repeat blood drawl at  1:15.  I have no old labs for comparison, but as far as she knows, and  the family knows; there is no previous history of hyponatremia.  On  January 16 her sodium was 134; based on the lab work that I just found  in the chart.  The family reports that she has been  sleepy.  She has  been nauseated.  She usually drinks at least 8 glasses of water at home  per day.   PAST MEDICAL HISTORY:  As above.   MEDICATIONS:  1. Her morphine PCA has been stopped.  2. Colace 100 mg p.o. b.i.d.  3. Warfarin per pharmacy protocol.  4. Tricor 145 mg a day.  5. Triamterene/hydrochlorothiazide has been held as of today.  6. Nadolol 10 mg a day.  7. Iron 150 mg p.o. b.i.d.  8. Tylox has been STOPPED.  9. Vicodin as been started.  10.Tylenol as needed.  11.Reglan as needed.  12.Robaxin as needed.  13.Ambien as needed.  14.At home she also takes Fosamax, esterase vaginal cream, and a baby      aspirin a day.   SOCIAL HISTORY:  She is here with her daughter, son-in-law, and husband.  There is no drinking or smoking history.  FAMILY HISTORY:  Noncontributory.   REVIEW OF SYSTEMS:  As above, otherwise negative.   PHYSICAL EXAMINATION:  VITAL SIGNS:  Her temperature is 98.6, pulse 79,  respiratory rate 20, blood pressure 140/68, oxygen saturation 97% on  room air.  GENERAL:  The patient is groggy-appearing white female in no acute  distress.  HEENT:  Normocephalic, atraumatic.  Pupils equal, round, reacted to  light, somewhat dry mucous membranes.  NECK:  Supple.  No JVD.  LUNGS:  Clear to auscultation bilaterally without wheezes, rhonchi, or  rales.  CARDIOVASCULAR:  Regular rate and rhythm without murmurs, gallops or  rubs.  ABDOMEN:  Normal bowel sounds, soft, nontender, nondistended.  GENITOURINARY AND RECTAL:  Deferred.  EXTREMITIES:  No clubbing, cyanosis or edema.  She has a splint on her  right leg.  NEUROLOGIC:  The patient has her eyes closed, but is oriented and  appropriate.  No focal deficits.  SKIN:  No rash.   LABS:  Hemoglobin, today, is 9.5; hematocrit 27;  normal white count and  platelet count.  INR, today, is 1.4.  Sodium on January 24 was 129;  glucose 137; otherwise unremarkable BMET.  This morning her sodium was  114, glucose  136, also her chloride was 85, otherwise unremarkable BMET.  The EKG from July 2007 shows normal sinus rhythm.  There is a Cardiolite  on the chart from August of 2007 which showed a fixed defect most likely  secondary to diaphragmatic attenuation, normal ejection fraction. Chest  x-ray shows scar versus subsegmental atelectasis of the right base.   ASSESSMENT/PLAN:  As above.  I will continue to follow.  Thank you for  allowing me to assist in the care of this nice lady.      Corinna L. Lendell Caprice, MD  Electronically Signed     CLS/MEDQ  D:  03/06/2006  T:  03/06/2006  Job:  [id number]   cc:   Caryn Bee L. Little, M.D.  Fax: [phone]   Ollen Gross, M.D.  Fax: [phone]

## 2010-06-28 NOTE — H&P (Signed)
[name], [name]              ACCOUNT NO.:  [id number]   MEDICAL RECORD NO.:  [id number]          PATIENT TYPE:  INP   LOCATION:  NA                           FACILITY:  [facility]   PHYSICIAN:  Ollen Gross, M.D.    DATE OF BIRTH:  1929/12/16   DATE OF ADMISSION:  03/04/2006  DATE OF DISCHARGE:                              HISTORY & PHYSICAL   Date of office visit, history and physical:  February 19, 2006.   CHIEF COMPLAINT:  Right knee pain.   HISTORY OF PRESENT ILLNESS:  The patient is a 77 year old female who has  been seen by Dr. Lequita Halt for ongoing right knee pain.  It has been  progressive in nature.  She has been refractory to conservative  measures.  She has reached the point where she would benefit from  undergoing surgery.  She has tried injections in the past and would like  to proceed with knee replacement.  Risks and benefits have been  discussed and she has elected to proceed with surgery.  She has had a  previous evaluation by her cardiologist, Armanda Magic, M.D., as noted by  Dr. Clarene Duke.  Dr. Clarene Duke has seen and felt that the patient is low-risk  and is cleared for surgery.   ALLERGIES:  PENICILLIN causes hives.  IV IODINE and SHELLFISH cause  hives, although please note, she is able to tolerate topical iodine,  which is not a problem.   CURRENT MEDICATIONS:  1. Triamterine/hydrochlorothiazide 37.5/25 mg.  2. Nadolol 20 mg.  3. Tricor 145 mg.  4. Nexium 20 mg.  5. Fosamax 70 mg.   PAST MEDICAL HISTORY:  1. Hypertension.  2. Reflux disease.  3. History of cystitis.  4. History of urinary tract infections.  5. Osteopenia.  6. Osteoarthritis.  7. Postmenopausal.   PAST SURGICAL HISTORY:  1. Tonsils.  2. C-section.  3. Appendectomy.  4. Knee arthroscopy.   SOCIAL HISTORY:  Married.  Retired.  Nonsmoker.  No alcohol.  Three  children.  Husband will be assisting with care after surgery.   FAMILY HISTORY:  Grandmother and aunts with arthritis.  She has  a half-  brother that has heart disease and diabetes.   REVIEW OF SYSTEMS:  No fevers, chills, night sweats.  NEUROLOGIC:  No  seizure, syncope or paralysis.  RESPIRATORY:  No shortness of breath,  productive cough or hemoptysis.  CARDIOVASCULAR:  No chest pain, angina  or orthopnea.  GI:  No nausea, vomiting, diarrhea or constipation.  GU:  No dysuria, hematuria or discharge.  She does have a little bit of  urgency in the morning.  MUSCULOSKELETAL:  Right knee.   PHYSICAL EXAMINATION:  VITAL SIGNS:  Pulse 68, respirations 12, blood  pressure 137/76.  GENERAL:  A 77 year old white female, well-nourished, well-developed, in  no acute distress.  Good historian.  Slightly overweight.  She is  accompanied by her husband.  HEENT:  Normocephalic, atraumatic.  Pupils equal, round and reactive.  Oropharynx clear.  EOMs intact.  NECK:  Supple.  CHEST:  Clear.  CARDIAC:  Regular rate and rhythm with a faint systolic  ejection murmur  best heard over pulmonic and Erb's points along the left sternal border.  ABDOMEN:  Soft, slightly round.  Bowel sounds present.  RECTAL, BREASTS, GENITALIA:  Not done, not pertinent to the present  illness.  EXTREMITIES:  Right knee:  Right knee shows a moderate crepitus on  passive range of motion.  Range of motion showing 5-120.  No effusion.   IMPRESSION:  1. Osteoarthritis, right knee.  2. Hypertension.  3. Reflux.  4. History of cystitis.  5. History of urinary tract infections.  6. Osteopenia.  7. Postmenopausal.   PLAN:  The patient will be admitted to [facility] to undergo  right total knee arthroplasty.  Surgery will be performed by Dr. Ollen Gross.  Her medical physician is Dr. Catha Gosselin, who will be  notified of the room number on admission and be consulted if needed for  medical assistance with the patient during the hospital course.  Her  cardiologist is Dr. Armanda Magic, who will also be notified and be  consulted if needed  during the hospital stay.      Alexzandrew L. Julien Girt, P.A.      Ollen Gross, M.D.  Electronically Signed    ALP/MEDQ  D:  03/03/2006  T:  03/04/2006  Job:  [id number]   cc:   Caryn Bee L. Little, M.D.  Fax: [phone]

## 2010-06-28 NOTE — Procedures (Signed)
[facility]  Patient:    [name], [name] Visit Number: [id number] MRN: [id number]          Service Type: END Location: ENDO Attending Physician:  Louie Bun Proc. Date: 11/04/00 Admit Date:  11/04/2000   CC:         Caryn Bee L. Little, M.D.   Procedure Report  PROCEDURE:  Colonoscopy.  INDICATION FOR PROCEDURE:  Intermittent rectal bleeding.  DESCRIPTION OF PROCEDURE:  The patient was placed in the left lateral decubitus position and placed on the pulse monitor with continuous low-flow oxygen delivered by nasal cannula.  She was sedated with 50 mg IV Demerol and 5 mg IV Versed.  The Olympus video colonoscope was inserted into the rectum and advanced to the cecum, confirmed by transillumination at McBurneys point and visualization of the ileocecal valve and appendiceal orifice.  The prep was excellent.  The cecum, ascending, transverse, descending, and sigmoid colon all appeared normal with no masses, polyps, diverticula, or other mucosal abnormalities.  The rectum likewise appeared normal, and retroflex view of the anus revealed some small internal hemorrhoids.  The colonoscope was then withdrawn and the patient returned to the recovery room in stable condition.  She tolerated the procedure well, and there were no immediate complications.  IMPRESSION:  Internal hemorrhoids, otherwise normal colonoscopy. Attending Physician:  Louie Bun DD:  11/04/00 TD:  11/04/00 Job: [id number]

## 2010-11-15 LAB — URINALYSIS, ROUTINE W REFLEX MICROSCOPIC
Bilirubin Urine: NEGATIVE
Glucose, UA: NEGATIVE mg/dL
Hgb urine dipstick: NEGATIVE
Ketones, ur: NEGATIVE mg/dL
Nitrite: NEGATIVE
Protein, ur: NEGATIVE mg/dL
Specific Gravity, Urine: 1.005 (ref 1.005–1.030)
Urobilinogen, UA: 0.2 mg/dL (ref 0.0–1.0)
pH: 6 (ref 5.0–8.0)

## 2010-11-15 LAB — COMPREHENSIVE METABOLIC PANEL
ALT: 17 U/L (ref 0–35)
AST: 24 U/L (ref 0–37)
Albumin: 3.4 g/dL — ABNORMAL LOW (ref 3.5–5.2)
Alkaline Phosphatase: 50 U/L (ref 39–117)
BUN: 22 mg/dL (ref 6–23)
CO2: 25 mEq/L (ref 19–32)
Calcium: 9.3 mg/dL (ref 8.4–10.5)
Chloride: 106 mEq/L (ref 96–112)
Creatinine, Ser: 0.89 mg/dL (ref 0.4–1.2)
GFR calc Af Amer: >60 mL/min (ref >60)
GFR calc non Af Amer: >60 mL/min (ref >60)
Glucose, Bld: 97 mg/dL (ref 70–99)
Potassium: 4.5 mEq/L (ref 3.5–5.1)
Sodium: 137 mEq/L (ref 135–145)
Total Bilirubin: 0.8 mg/dL (ref 0.3–1.2)
Total Protein: 6.6 g/dL (ref 6.0–8.3)

## 2010-11-15 LAB — CBC
HCT: 36.9 % (ref 36.0–46.0)
Hemoglobin: 12.4 g/dL (ref 12.0–15.0)
MCHC: 33.6 g/dL (ref 30.0–36.0)
MCV: 88.9 fL (ref 78.0–100.0)
Platelets: 154 10*3/uL (ref 150–400)
RBC: 4.15 MIL/uL (ref 3.87–5.11)
RDW: 13.5 % (ref 11.5–15.5)
WBC: 4.6 10*3/uL (ref 4.0–10.5)

## 2010-11-15 LAB — APTT: aPTT: 27 seconds (ref 24–37)

## 2010-11-15 LAB — PROTIME-INR
INR: 1.1 (ref 0.00–1.49)
Prothrombin Time: 14.7 seconds (ref 11.6–15.2)

## 2011-01-08 ENCOUNTER — Other Ambulatory Visit: Payer: Self-pay | Admitting: Obstetrics and Gynecology

## 2011-01-08 DIAGNOSIS — R140 Abdominal distension (gaseous): Secondary | ICD-10-CM

## 2011-01-09 ENCOUNTER — Ambulatory Visit
Admission: RE | Admit: 2011-01-09 | Discharge: 2011-01-09 | Disposition: A | Discharge status: Home / Self Care | Payer: Medicare Other | Source: Ambulatory Visit | Attending: Obstetrics and Gynecology | Admitting: Obstetrics and Gynecology

## 2011-01-09 DIAGNOSIS — R140 Abdominal distension (gaseous): Secondary | ICD-10-CM

## 2011-01-09 MED ORDER — IOHEXOL 300 MG/ML  SOLN
100.0000 mL | Freq: Once | INTRAMUSCULAR | Status: AC | PRN
  Administered 2011-01-09: 100 mL via INTRAVENOUS

## 2011-01-15 ENCOUNTER — Encounter: Payer: Self-pay | Admitting: Gynecologic Oncology

## 2011-01-15 ENCOUNTER — Telehealth: Payer: Self-pay | Admitting: Oncology

## 2011-01-15 ENCOUNTER — Ambulatory Visit: Payer: Medicare Other | Attending: Gynecologic Oncology | Admitting: Gynecologic Oncology

## 2011-01-15 DIAGNOSIS — Z87891 Personal history of nicotine dependence: Secondary | ICD-10-CM | POA: Insufficient documentation

## 2011-01-15 DIAGNOSIS — E785 Hyperlipidemia, unspecified: Secondary | ICD-10-CM | POA: Insufficient documentation

## 2011-01-15 DIAGNOSIS — M899 Disorder of bone, unspecified: Secondary | ICD-10-CM | POA: Insufficient documentation

## 2011-01-15 DIAGNOSIS — R971 Elevated cancer antigen 125 [CA 125]: Secondary | ICD-10-CM

## 2011-01-15 DIAGNOSIS — Z96659 Presence of unspecified artificial knee joint: Secondary | ICD-10-CM | POA: Insufficient documentation

## 2011-01-15 DIAGNOSIS — Z79899 Other long term (current) drug therapy: Secondary | ICD-10-CM | POA: Insufficient documentation

## 2011-01-15 DIAGNOSIS — K219 Gastro-esophageal reflux disease without esophagitis: Secondary | ICD-10-CM | POA: Insufficient documentation

## 2011-01-15 DIAGNOSIS — C786 Secondary malignant neoplasm of retroperitoneum and peritoneum: Secondary | ICD-10-CM | POA: Insufficient documentation

## 2011-01-15 DIAGNOSIS — C801 Malignant (primary) neoplasm, unspecified: Secondary | ICD-10-CM | POA: Insufficient documentation

## 2011-01-15 DIAGNOSIS — I10 Essential (primary) hypertension: Secondary | ICD-10-CM | POA: Insufficient documentation

## 2011-01-15 DIAGNOSIS — R188 Other ascites: Secondary | ICD-10-CM | POA: Insufficient documentation

## 2011-01-15 DIAGNOSIS — K449 Diaphragmatic hernia without obstruction or gangrene: Secondary | ICD-10-CM | POA: Insufficient documentation

## 2011-01-15 NOTE — Patient Instructions (Signed)
Tomorrow for paracentesis at 9:15.

## 2011-01-15 NOTE — Progress Notes (Signed)
Consult Note: Gyn-Onc  [name] 77 y.o. female  CC:  Chief Complaint  Patient presents with  . Gynecologic Exam    New pt, Ascites ?ovarian ca    HPI: Seen today consultation request of Dr. Billy Coast. [name] 77 year old gravida 4 para 3 aborta 1. She noticed over the past few months her closer gradually fitting a little bit tighter as were her undergarments. She did purchase new undergarments as a result, but there was no real relief from the "tightness". She has had a couple episodes of fairly severe diarrhea over the last about 2-3 weeks. She was complaining of bloating during that period of time. Her husband had a diagnosis of C. difficile she was tested 3 times for C. difficile each time being negative. She was ultimately treated with Flagyl and her diarrhea improved. Dr. to [facility] in the office and ultrasound was obtained this was significant amount of ascites. CO2 5 was performed that was 354.8. CEA was 2.6. She had a CT scan of the abdomen and pelvis that was performed on November 29. The lung bases to be clear. She had a moderate size hiatal hernia. The gallbladder, pancreas, adrenals, kidneys, liver, and spleen are all normal. There was a moderate amount of ascites with peritoneal cavity. In addition there is abnormal soft tissue nodularity diffusely throughout the peritoneal cavity particularly anteriorly most consistent with peritoneal spread of carcinoma.  The soft tissue within the mid pelvis probably representing an atrophic uterus. The residual ovarian tissue appeared somewhat inhomogeneous but no adnexal masses evident. There was no abnormality of the colon.  Interval History: As above  Review of Systems: As any chest pain, shortness of breath. She does complain of being somewhat breathless with walking as the ascites and abdominal pressure has gotten worse. She has had some fairly significant in quick weight gain but has been unintentional. She has a decreased appetite and  early satiety. She has had no nausea emesis times one. She had diarrhea as mentioned above. Otherwise has been very functional and has enjoyed very good quality of life except for the past few months. The diarrhea really has made her feel very weak and tired. 10 point review of systems is otherwise negative  Current Meds:  Outpatient Encounter Prescriptions as of 01/15/2011  Medication Sig Dispense Refill  . acetaminophen (TYLENOL) 325 MG tablet Take 650 mg by mouth as needed.        . calcium citrate-vitamin D (CITRACAL+D) 315-200 MG-UNIT per tablet Take 2 tablets by mouth daily.        . Cholecalciferol (VITAMIN D) 2000 UNITS tablet Take 2,000 Units by mouth daily.        Marland Kitchen estradiol (ESTRACE) 0.1 MG/GM vaginal cream Place vaginally as needed.        . fluticasone (FLONASE) 50 MCG/ACT nasal spray Place 2 sprays into the nose as needed.        Marland Kitchen lisinopril (PRINIVIL,ZESTRIL) 10 MG tablet Take 5 mg by mouth daily.        . Multiple Vitamins-Minerals (CENTRUM SILVER PO) Take 1 tablet by mouth daily.        . nadolol (CORGARD) 20 MG tablet Take 10 mg by mouth daily.       Marland Kitchen nystatin-triamcinolone (MYCOLOG II) cream Apply 1 application topically as needed.        Marland Kitchen omeprazole (PRILOSEC) 20 MG capsule Take 20 mg by mouth daily.        . vitamin E 400 UNIT capsule Take  400 Units by mouth daily.        Marland Kitchen zolpidem (AMBIEN) 10 MG tablet Take 5-10 mg by mouth at bedtime as needed.        Marland Kitchen alendronate (FOSAMAX) 70 MG tablet Take 70 mg by mouth every 7 (seven) days. Take with a full glass of water on an empty stomach.       . iron polysaccharides (NIFEREX) 150 MG capsule Take 450 mg by mouth daily.        Marland Kitchen omega-3 acid ethyl esters (LOVAZA) 1 G capsule Take 2 g by mouth daily.        . pantoprazole (PROTONIX) 40 MG tablet Take 40 mg by mouth daily.          Allergy:  Allergies  Allergen Reactions  . Iodine     REACTION: hives  . Morphine And Related     Given after knee replacement and code blue  occurred  . Penicillins     REACTION: hives  . Shellfish Allergy     Pt has shellfish allergy only.  Has had IV contrast x 2 and did fine.    Social Hx:   History   Social History  . Marital Status: Married    Spouse Name: N/A    Number of Children: N/A  . Years of Education: N/A   Occupational History  . Not on file.   Social History Main Topics  . Smoking status: Former Smoker    Quit date: 02/11/1952  . Smokeless tobacco: Not on file  . Alcohol Use: No  . Drug Use: No  . Sexually Active: No   Other Topics Concern  . Not on file   Social History Narrative  . No narrative on file    Past Surgical Hx:  Past Surgical History  Procedure Date  . Tonsillectomy   . Appendectomy   . Joint replacement     R knee in 2008, 5 operations on L knee    Past Medical Hx:  Past Medical History  Diagnosis Date  . Ascites   . Cystitis   . GERD (gastroesophageal reflux disease)   . Hypertension   . Hyponatremia   . Hyperlipidemia   . Osteopenia   . Postmenopausal atrophic vaginitis   . Vulvitis   . Staph infection 2010    after knee replacement  . Urinary frequency     Family Hx:  Family History  Problem Relation Age of Onset  . Heart attack Brother   . Diabetes Brother   . Cancer Other     Bladder cancer    Vitals:  Blood pressure 142/80, pulse 84, temperature 99 F (37.2 C), resp. rate 24, height 5' 3.94" (1.624 m), weight 162 lb 3.2 oz (73.573 kg).  Physical Exam: Well-developed female in no acute distress.  Neck: No lymphadenopathy no thyromegaly.  Lungs: Clear to auscultation bilaterally.  Cardiovascular: Regular rate and rhythm.  Abdomen: Distended. There is a palpable fluid wave. There is no palpable masses. Could not assess for any hepatosplenomegaly secondary to massive ascites. Abdomen is markedly distended.  Groins: Negative for adenopathy.  Extremities: Well-healed surgical incisions over both knees. 1+ nonpitting edema equal  bilaterally.  Pelvic: External genitalia is within normal limits. Vagina is markedly atrophic. The cervix is without lesions. Bimanual examination the purposes of normal size shape and consistency. There were no adnexal masses. There is no nodularity.  Rectal: As above confirmatory of no nodularity.    Assessment/Plan: D69-year-old with marked ascites elevated  CA 125 and carcinomatosis on CT scan which is most likely consistent with ovarian carcinoma. I spent 40 minutes face to face time with the patient and her family including her husband, her son, her daughter, and her daughter's fianc. I discussed with them that typically a combination of surgery and chemotherapy or use to treat ovarian carcinoma. However, she does not have large bulky disease and she has small carcinomatosis I do not feel that a primary debulking surgery at this time will be particularly helpful for her. Then that we start with paracentesis for both for therapeutic as well as symptomatic relief. This has been arranged for tomorrow at 9:15 in the morning. This will be sent for cytology for definitive diagnosis. If the diagnosis is confirmatory for ovarian carcinoma she has an appointment to see Dr. let us let us say on December 12. Discuss with them that most likely proceed with chemotherapy with paclitaxel and carboplatin. We discussed most usual risks complications and side effects of these medications. They understand ultimately the decision regarding chemotherapy would be made by Dr. Darrold Span. I discussed with them the CA 125 be drawn at each cycle of chemotherapy. That would most likely see her after 3 cycles of chemotherapy for discussion regarding interval debulking at that point. If we do proceed with surgery after 3 cycles she understands that'll bechemotherapy after her surgical debulking. We discussed that typically 6 cycles of chemotherapy is the usual. They had questions regarding outcomes. At about this point it is too early  to really predict outcomes I did discuss with them the statistically 80% of patients are able to go into remission. She and her daughter had questions regarding her daughter's personal risk ovarian carcinoma and these were discussed with them. The questions are listed in answer to her satisfaction. They have my business card that they can contact me with any additional questions. They're very pleased with the consultation today. They wanted for me to document in the medical record that we may speak to her son and daughter at any point   [facility] A., MD 01/15/2011, 3:42 PM

## 2011-01-15 NOTE — Telephone Encounter (Signed)
Added appt for 12/12 @ 1 pm w/LL. Nancy to contact pt w/appt d/t. Info to tiffany. Per nancy d/t per LL.

## 2011-01-16 ENCOUNTER — Ambulatory Visit (HOSPITAL_COMMUNITY)
Admission: RE | Admit: 2011-01-16 | Discharge: 2011-01-16 | Disposition: A | Discharge status: Home / Self Care | Payer: Medicare Other | Source: Ambulatory Visit | Attending: Gynecologic Oncology | Admitting: Gynecologic Oncology

## 2011-01-16 DIAGNOSIS — C569 Malignant neoplasm of unspecified ovary: Secondary | ICD-10-CM | POA: Insufficient documentation

## 2011-01-16 DIAGNOSIS — R180 Malignant ascites: Secondary | ICD-10-CM | POA: Insufficient documentation

## 2011-01-16 NOTE — Procedures (Signed)
Pleasant 77 yo female with history of ovarian cancer and ascites sent for a therapeutic / diagnostic ultra sound guided paracentesis. Diagnostic / Therapeutic ultra sound guided paracentesis ordered  Performed - Right mid lateral abdominal quadrant  Yield - 2.4 liters of dark amber fluid.  No complications  Patient tolerated well  No significant blood loss  Specimens sent to lab as ordered

## 2011-01-21 ENCOUNTER — Other Ambulatory Visit: Payer: Medicare Other

## 2011-01-22 ENCOUNTER — Ambulatory Visit (HOSPITAL_COMMUNITY)
Admission: RE | Admit: 2011-01-22 | Discharge: 2011-01-22 | Disposition: A | Discharge status: Home / Self Care | Payer: Medicare Other | Source: Ambulatory Visit | Attending: Oncology | Admitting: Oncology

## 2011-01-22 ENCOUNTER — Ambulatory Visit (HOSPITAL_BASED_OUTPATIENT_CLINIC_OR_DEPARTMENT_OTHER): Payer: Medicare Other | Admitting: Oncology

## 2011-01-22 ENCOUNTER — Other Ambulatory Visit (HOSPITAL_BASED_OUTPATIENT_CLINIC_OR_DEPARTMENT_OTHER): Payer: Medicare Other | Admitting: Lab

## 2011-01-22 ENCOUNTER — Ambulatory Visit: Payer: Medicare Other

## 2011-01-22 DIAGNOSIS — C569 Malignant neoplasm of unspecified ovary: Secondary | ICD-10-CM

## 2011-01-22 DIAGNOSIS — R180 Malignant ascites: Secondary | ICD-10-CM | POA: Insufficient documentation

## 2011-01-22 DIAGNOSIS — C801 Malignant (primary) neoplasm, unspecified: Secondary | ICD-10-CM

## 2011-01-22 DIAGNOSIS — C786 Secondary malignant neoplasm of retroperitoneum and peritoneum: Secondary | ICD-10-CM

## 2011-01-22 DIAGNOSIS — B370 Candidal stomatitis: Secondary | ICD-10-CM

## 2011-01-22 DIAGNOSIS — E871 Hypo-osmolality and hyponatremia: Secondary | ICD-10-CM

## 2011-01-22 DIAGNOSIS — N390 Urinary tract infection, site not specified: Secondary | ICD-10-CM

## 2011-01-22 LAB — COMPREHENSIVE METABOLIC PANEL
ALT: 14 U/L (ref 0–35)
AST: 31 U/L (ref 0–37)
Albumin: 3.2 g/dL — ABNORMAL LOW (ref 3.5–5.2)
Alkaline Phosphatase: 53 U/L (ref 39–117)
BUN: 10 mg/dL (ref 6–23)
CO2: 24 mEq/L (ref 19–32)
Calcium: 8.6 mg/dL (ref 8.4–10.5)
Chloride: 94 mEq/L — ABNORMAL LOW (ref 96–112)
Creatinine, Ser: 0.91 mg/dL (ref 0.50–1.10)
Glucose, Bld: 125 mg/dL — ABNORMAL HIGH (ref 70–99)
Potassium: 5 mEq/L (ref 3.5–5.3)
Sodium: 127 mEq/L — ABNORMAL LOW (ref 135–145)
Total Bilirubin: 0.4 mg/dL (ref 0.3–1.2)
Total Protein: 6.2 g/dL (ref 6.0–8.3)

## 2011-01-22 LAB — CBC WITH DIFFERENTIAL/PLATELET
BASO%: 0.5 % (ref 0.0–2.0)
Basophils Absolute: 0 10*3/uL (ref 0.0–0.1)
EOS%: 1.1 % (ref 0.0–7.0)
Eosinophils Absolute: 0.1 10*3/uL (ref 0.0–0.5)
HCT: 36.3 % (ref 34.8–46.6)
HGB: 11.9 g/dL (ref 11.6–15.9)
LYMPH%: 9.2 % — ABNORMAL LOW (ref 14.0–49.7)
MCH: 26.8 pg (ref 25.1–34.0)
MCHC: 32.8 g/dL (ref 31.5–36.0)
MCV: 81.8 fL (ref 79.5–101.0)
MONO#: 0.9 10*3/uL (ref 0.1–0.9)
MONO%: 9.2 % (ref 0.0–14.0)
NEUT#: 7.6 10*3/uL — ABNORMAL HIGH (ref 1.5–6.5)
NEUT%: 80 % — ABNORMAL HIGH (ref 38.4–76.8)
Platelets: 462 10*3/uL — ABNORMAL HIGH (ref 145–400)
RBC: 4.43 10*6/uL (ref 3.70–5.45)
RDW: 14.6 % — ABNORMAL HIGH (ref 11.2–14.5)
WBC: 9.5 10*3/uL (ref 3.9–10.3)
lymph#: 0.9 10*3/uL (ref 0.9–3.3)

## 2011-01-22 LAB — CA 125: CA 125: 204.1 U/mL — ABNORMAL HIGH (ref 0.0–30.2)

## 2011-01-22 MED ORDER — FLUCONAZOLE 100 MG PO TABS: 100.0000 mg | ORAL_TABLET | Freq: Every day | ORAL | Status: DC

## 2011-01-22 MED ORDER — ONDANSETRON HCL 8 MG PO TABS: ORAL_TABLET | ORAL | Status: DC

## 2011-01-22 MED ORDER — DEXAMETHASONE 4 MG PO TABS: ORAL_TABLET | ORAL | Status: DC

## 2011-01-22 MED ORDER — LORAZEPAM 0.5 MG PO TABS: ORAL_TABLET | ORAL | Status: DC

## 2011-01-22 NOTE — Procedures (Signed)
Pleasant 77 yo female with recurrent malignant ascites and ovarian cancer sent for a therapeutic ultra sound guided paracentesis.   Performed -  Left mid lateral abdominal quadrant  Yield - 3.2 liters of dark amber fluid.  No complications  Patient tolerated well  No significant blood loss

## 2011-01-23 ENCOUNTER — Other Ambulatory Visit: Payer: Self-pay | Admitting: Oncology

## 2011-01-23 DIAGNOSIS — C569 Malignant neoplasm of unspecified ovary: Secondary | ICD-10-CM

## 2011-01-24 ENCOUNTER — Telehealth: Payer: Self-pay

## 2011-01-24 ENCOUNTER — Telehealth: Payer: Self-pay | Admitting: Oncology

## 2011-01-24 ENCOUNTER — Telehealth: Payer: Self-pay | Admitting: *Deleted

## 2011-01-24 NOTE — Telephone Encounter (Signed)
Returned call to patient, after she had long conversation with triage RN then had called to my RN with more questions; my RN has also spoken with daughter Raynelle Fanning by phone earlier today. Patient is concerned about directions for lorazepam and ondansetron: I had asked her to take lorazepam night of chemo whether or not any nausea and ondansetron 1 tab in am day after chemo., and otherwise can use these prn nausea as directions are on bottles. We also reviewed decadron 5 tabs = 20 mg with food 12 hrs and 6 hrs prior to chemo. I have asked her to bring bottles to treatment on 12-17 so that we can go over again with her and daughter. I reminded her about gatorade, which she has not had yet. I asked her to drink gatorade in preference to water until we recheck labs on 12-17. She understands that she can call back if other questions or problems.

## 2011-01-24 NOTE — Telephone Encounter (Signed)
SPOKE WITH DTR. JULIE AND TOLD HER THAT HER MOTHER'S SODIUM WAS 127 AT 01-22-11 VISIT. DR. Darrold Span WANTS HER TO PUSH GATORADE INSTEAD OF WATER WITH SODIUM LOW.  TOLD JULIE THAT THE SODIUM LEVEL WILL BE RECHECKED WHEN SHE COMES FOR TREATMENT ON 01-27-11.  HOPEFULLY LOTS OF GATORADE WILL INCREASE HER SODIUM LEVEL.

## 2011-01-24 NOTE — Telephone Encounter (Signed)
Patient called asking how to take dexamethasone, lorazepam and ondansetron.  Also asked about her sodium level and reports she has a UTI, taking cipro and her daughter will be getting gatorade for her to drink.  This was a thirty minute call instructing pt.  Eventually asked that she write these instructions on a calendar.  Patient asked that we write down the times for her on Monday.  Asked that she journal noting times she takes her medicines so if n/v occurs she can read when she took the medicine and look at the time on the clock to see if enough time has elapsed.

## 2011-01-25 ENCOUNTER — Encounter: Payer: Self-pay | Admitting: Oncology

## 2011-01-25 DIAGNOSIS — C569 Malignant neoplasm of unspecified ovary: Secondary | ICD-10-CM

## 2011-01-25 HISTORY — DX: Malignant neoplasm of unspecified ovary: C56.9

## 2011-01-25 NOTE — Progress Notes (Signed)
[facility] NEW PATIENT EVALUATION   Name: [name] Date of visit 01-22-2011 MRN: [id number] DOB: [birth year]  REFERRING PHYSICIAN:Paola Duard Brady, MD    CC:  Jennifer Gosselin, MD;  Jennifer Mackie, MD; Jennifer Cookey, MD;  Jennifer Grip, MD   REASON FOR REFERRAL: advanced gyn carcinoma, for consideration of neoadjuvant chemotherapy     HISTORY OF PRESENT ILLNESS:[name] is a 77 y.o. female who is seen, together with husband and daughter [name], on referral from Dr. Cleda Mccreedy with new diagnosis of probable ovarian carcinoma. Primary care has been by Dr.Kevin Little for years, and the diagnostic evaluation was initiated by Dr. Olivia [name]. Patient had been in her usual fairly good health until past couple of months when she had some increase in abdominal girth, ~ 4 weeks of diarrhea without clear etiology (treated empirically for C.diff) with weight gain of ~ 10 lbs despite decreased po intake, then more significant abdominal bloating. She was seen and treated initially by her gastroenterologist, then saw Dr.Taavon for routine exam. Ultrasound at that office showed ascites and ca125 was 354.8 (with CEA 2.6). CT AP with contrast in [facility] system 01-09-11 showed moderate ascites and peritoneal nodularity consistent with peritoneal carcinomatosis, without hydronephrosis and no clear adnexal mass. She was referred to Dr.Gehrig, with her exam 01-15-2011 primarily remarkable for ascites. Ultrasound paracentesis 01-16-2011 removed 2.4 liters of fluid with cytology ([id number]) malignant cells with morphology and immunoprofile consistent with serous carcinoma suggesting ovarian or primary peritoneal. The patient was somewhat more comfortable after that paracentesis, however again notices uncomfortable abdominal distension now. With extent of involvement apparent, Dr.Gehrig has recommended beginning treatment with chemotherapy with possible interval debulking after 3 cycles. At patient's  request, her daughter attended chemotherapy education class prior to this visit and has shared that information with patient, who did not feel well enough to attend the class.  [name] is being treated with a 5 day course of Cipro for UTI by Dr. Clarene Duke; they believe that a urine culture was sent from his office. Diarrhea stopped ~ 1 week ago. She has seen no bleeding. She has no actual abdominal or pelvic pain. She is somewhat SOB with exertion, without cough or chest pain. She is very thirsty. She has not had LE swelling or tenderness.     REVIEW OF SYSTEMS: as above, also some HA with these problems early on, none now and no HA typically. Good visual acuity with reading glasses, some environmental allergies controlled with prn Flonase, no hearing problems, no thyroid problems, up to date on dental exams, no wheezing, no cardiac symptoms, no changes in breasts, some mild GERD improved with medications, no arthritis tho multiple orthopedic procedures on knees.  ALLERGIES: Iodine; Morphine and related; Penicillins; and Shellfish allergy She has had IV contrast for CT x2 without difficulty. Note coded with morphine after orthopedic surgery, hives with PCN.  PAST MEDICAL HISTORY: G4  P3 A1 Right knee replacement 2008 5 surgeries left knee related to staph infection of knee replacement. "No orthopedic problems now in 18 months. GERD Elevated cholesterol HTN Remote zoster Tonsillectomy/appendectomy SIADH with knee replacement Has had flu vaccine this fall Mammograms up to date     CURRENT MEDICATIONS: reviewed as noted   SOCIAL HISTORY:Originally from [place], lives with husband in [place]. Married for 58 years. Daughter [name] in [place], daughter in [place] and son in [place], 6 grandchildren. 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  reports that she quit smoking about 58  years ago. She does not have any smokeless tobacco history on file. She reports  that she does not drink alcohol or use illicit drugs.   FAMILY HISTORY: Mother died at 84 of renal failure following hysterectomy. Father died at 75 with Parkinsons. Brother died of pulmonary fibrosis, brother died of "brain cancer", brother with Parkinsons, brother with MI, brother with DM. Maternal aunt with bladder cancer.  LABORATORY DATA:  CBC 01-22-2011 WBC 9.5, ANC 7.6, Hgb 11.9, plt 462k CMET available after visit Na 127, K 5, Cl 94, CO2 24, glu 125, BUN 10, creat 0.9, LFTs ok, TP 6.2, alb 3.2, Ca 8.6 CA125 204  RADIOGRAPHY: CT AP 01-08-11 IMPRESSION:   1. Nodular soft tissue masses throughout the peritoneal cavity with a moderate amount of ascites most concerning for peritoneal carcinomatosis. This pattern is most suspicious for ovarian carcinoma. 2.  No adnexal lesion is seen with the ovaries being somewhat inhomogeneous but not enlarged. 3.  Moderate sized hiatal hernia.        PHYSICAL EXAM:  height is 5\' 2"  (1.575 m) and weight is 194 lb (87.998 kg). Her oral temperature is 98.6 F (37 C). Her blood pressure is 173/92 and her pulse is 86.  Pleasant lady appears stated age, looks mildly uncomfortable and unwell. Family very supportive; daughter seems to follow conversation and understand directions better than patient. HEENT: PERRL, not icteric. Mouth dry with oral candida on tongue, good dental hygiene, posterior pharynx without erythema. Neck supple without JVD or adenopathy.  No supraclavicular or axillary adenopathy. Lungs clear to A&P with BS somewhat diminished in bases bilaterally Cor RRR without gallop No central catheter Breasts without masses or skin changes of concern Abdomen distended, few bowel sounds, fluid wave, not discretely tender, no clear masses or HSM. No inguinal adenopathy. LE without clubbing, cyanosis, edema or tenderness No focal neurologic deficits. Speech fluent and appropriate.    We have discussed circumstances surrounding this  diagnosis and the recommendation for initial chemotherapy. We have discussed spread patterns of gyn cancers, mechanism of action of chemotherapeutic drugs in general and side effects including nausea, drop in blood counts, hair loss, allergic reactions, neuropathy. We have discussed antiemetics and premedication decadron and I have given patient written and oral instructions to use ativan night of chemo and zofran am after chemo whether or not nauseated, otherwise on prn schedule as indicated on the prescriptions. She and family are in agreement with plan for chemotherapy and willing to begin at first available time, which is 01-27-2011. I have also set up another paracentesis to be done by IR at [facility] following our visit now. Patient and family have had their questions answered to their satisfaction and understand that they can call at any time if questions or concerns. Daughter Tatjana Turcott works with [employer] [phone], office [phone].   IMPRESSION/PLAN:  1. 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 I will see her back at least 02-03-2011 with CBC/CMET. 2.Hyponatremia: likely due to drinking only water in setting of recent diarrhea and the ascites. Patient will be instructed to drink gatorade and we will repeat stat Bmet with treatment. Repeat paracentesis therapeutic today. 3.UTI on cipro: will repeat urine culture on 01-27-2011 4. Multiple orthopedic procedures 5. Allergies as noted including code with morphine 6. History of SIADH with orthopedic surgery 7.GERD 8. Oral candida likely related to antibiotics: diflucan x  7 days    Edom Schmuhl P, MD 01/25/2011 10:13 PM

## 2011-01-27 ENCOUNTER — Other Ambulatory Visit: Payer: Self-pay | Admitting: Physician Assistant

## 2011-01-27 ENCOUNTER — Ambulatory Visit (HOSPITAL_BASED_OUTPATIENT_CLINIC_OR_DEPARTMENT_OTHER): Payer: Medicare Other | Admitting: Lab

## 2011-01-27 ENCOUNTER — Ambulatory Visit (HOSPITAL_BASED_OUTPATIENT_CLINIC_OR_DEPARTMENT_OTHER): Payer: Medicare Other

## 2011-01-27 ENCOUNTER — Other Ambulatory Visit: Payer: Self-pay

## 2011-01-27 VITALS — BP 182/88 | HR 76 | Temp 97.9°F

## 2011-01-27 DIAGNOSIS — N390 Urinary tract infection, site not specified: Secondary | ICD-10-CM

## 2011-01-27 DIAGNOSIS — C579 Malignant neoplasm of female genital organ, unspecified: Secondary | ICD-10-CM

## 2011-01-27 DIAGNOSIS — E871 Hypo-osmolality and hyponatremia: Secondary | ICD-10-CM

## 2011-01-27 DIAGNOSIS — Z5111 Encounter for antineoplastic chemotherapy: Secondary | ICD-10-CM

## 2011-01-27 DIAGNOSIS — C569 Malignant neoplasm of unspecified ovary: Secondary | ICD-10-CM

## 2011-01-27 DIAGNOSIS — C786 Secondary malignant neoplasm of retroperitoneum and peritoneum: Secondary | ICD-10-CM

## 2011-01-27 DIAGNOSIS — R188 Other ascites: Secondary | ICD-10-CM

## 2011-01-27 LAB — URINALYSIS, MICROSCOPIC - CHCC
Bilirubin (Urine): NEGATIVE
Blood: NEGATIVE
Glucose: NEGATIVE g/dL
Ketones: NEGATIVE mg/dL
Nitrite: NEGATIVE
Protein: <30 mg/dL
RBC count: NEGATIVE (ref 0–2)
Specific Gravity, Urine: 1.01 (ref 1.003–1.035)
pH: 6 (ref 4.6–8.0)

## 2011-01-27 LAB — CBC WITH DIFFERENTIAL/PLATELET
BASO%: 0.3 % (ref 0.0–2.0)
Basophils Absolute: 0 10*3/uL (ref 0.0–0.1)
EOS%: 0 % (ref 0.0–7.0)
Eosinophils Absolute: 0 10*3/uL (ref 0.0–0.5)
HCT: 36.1 % (ref 34.8–46.6)
HGB: 12 g/dL (ref 11.6–15.9)
LYMPH%: 9.4 % — ABNORMAL LOW (ref 14.0–49.7)
MCH: 26.2 pg (ref 25.1–34.0)
MCHC: 33.2 g/dL (ref 31.5–36.0)
MCV: 78.8 fL — ABNORMAL LOW (ref 79.5–101.0)
MONO#: 0 10*3/uL — ABNORMAL LOW (ref 0.1–0.9)
MONO%: 0.4 % (ref 0.0–14.0)
NEUT#: 7.1 10*3/uL — ABNORMAL HIGH (ref 1.5–6.5)
NEUT%: 89.9 % — ABNORMAL HIGH (ref 38.4–76.8)
Platelets: 532 10*3/uL — ABNORMAL HIGH (ref 145–400)
RBC: 4.58 10*6/uL (ref 3.70–5.45)
RDW: 14.2 % (ref 11.2–14.5)
WBC: 7.9 10*3/uL (ref 3.9–10.3)
lymph#: 0.7 10*3/uL — ABNORMAL LOW (ref 0.9–3.3)
nRBC: 0 % (ref 0–0)

## 2011-01-27 LAB — BASIC METABOLIC PANEL
BUN: 10 mg/dL (ref 6–23)
CO2: 21 mEq/L (ref 19–32)
Calcium: 9.2 mg/dL (ref 8.4–10.5)
Chloride: 95 mEq/L — ABNORMAL LOW (ref 96–112)
Creatinine, Ser: 0.82 mg/dL (ref 0.50–1.10)
Glucose, Bld: 216 mg/dL — ABNORMAL HIGH (ref 70–99)
Potassium: 4.2 mEq/L (ref 3.5–5.3)
Sodium: 128 mEq/L — ABNORMAL LOW (ref 135–145)

## 2011-01-27 MED ORDER — SODIUM CHLORIDE 0.9 % IV SOLN
Freq: Once | INTRAVENOUS | Status: AC
  Administered 2011-01-27: 14:00:00 via INTRAVENOUS

## 2011-01-27 MED ORDER — FAMOTIDINE IN NACL 20-0.9 MG/50ML-% IV SOLN
20.0000 mg | Freq: Once | INTRAVENOUS | Status: AC
  Administered 2011-01-27: 20 mg via INTRAVENOUS

## 2011-01-27 MED ORDER — PACLITAXEL CHEMO INJECTION 300 MG/50ML
135.0000 mg/m2 | Freq: Once | INTRAVENOUS | Status: AC
  Administered 2011-01-27: 264 mg via INTRAVENOUS
  Filled 2011-01-27: qty 44

## 2011-01-27 MED ORDER — SODIUM CHLORIDE 0.9 % IV SOLN: 16.0000 mg | Freq: Once | INTRAVENOUS | Status: DC

## 2011-01-27 MED ORDER — DEXAMETHASONE SODIUM PHOSPHATE 4 MG/ML IJ SOLN
20.0000 mg | Freq: Once | INTRAMUSCULAR | Status: AC
  Administered 2011-01-27: 20 mg via INTRAVENOUS

## 2011-01-27 MED ORDER — SODIUM CHLORIDE 0.9 % IV SOLN
473.0000 mg | Freq: Once | INTRAVENOUS | Status: AC
  Administered 2011-01-27: 470 mg via INTRAVENOUS
  Filled 2011-01-27: qty 47

## 2011-01-27 MED ORDER — DIPHENHYDRAMINE HCL 50 MG/ML IJ SOLN
50.0000 mg | Freq: Once | INTRAMUSCULAR | Status: AC
  Administered 2011-01-27: 50 mg via INTRAVENOUS

## 2011-01-27 MED ORDER — ONDANSETRON 16 MG/50ML IVPB (CHCC)
16.0000 mg | Freq: Once | INTRAVENOUS | Status: AC
  Administered 2011-01-27: 16 mg via INTRAVENOUS

## 2011-01-27 MED ORDER — SODIUM CHLORIDE 0.9 % IV SOLN
Freq: Once | INTRAVENOUS | Status: AC
  Administered 2011-01-27: 10:00:00 via INTRAVENOUS

## 2011-01-27 NOTE — Progress Notes (Signed)
REVIEWED B-MET RESULTS WITH DR. Darrold Span.  NA+ 128 TODAY UP FROM 127 ON 01-22-11. ORDER GIVEN FOR AN ADDITIONAL NS IV TODAY WITH CHEMOTHERAPY GIVEN AT 250/HR. REVIEWED UA RESULTS WITH DR. Darrold Span.  SHE SAID TO WAIT FOR URINE CULTURE RESULTS. SPENT 30 MINUTES WITH DTR. JULIE IN EXAM ROOM REVIEWING POST CHEMO INSTRUCTIONS FOR NAUSEA MEDS AS WRITTEN DOWN FOR PATIENT AT APPT. 01-22-11.  ENCOURAGED DTR. TO STAY WITH MOTHER TONIGHT TO GIVE MEDS AS MOTHER IS NOT ABLE TO UNDERSTAND  DIRECTIONS CORRECTLY AT THIS POINT IN TIME. DTR. WILL PUT AMBIEN AND PREVIOUS PRESCRIPTION OF VALIUM OUT OF REACH OF MOTHER SINCE SHE HAS ATIVAN FOR NAUSEA AND HER MOTHER TOOK A VALUM OVER THE WEEKEND FOR NERVES.   TOLD HER DTR. TO CALL  THERE ARE ANY CONCERNS ABOUT MEDS NOT CONTROLLING SYMPTOMS.

## 2011-01-28 ENCOUNTER — Telehealth: Payer: Self-pay | Admitting: *Deleted

## 2011-01-28 LAB — URINE CULTURE

## 2011-01-28 NOTE — Telephone Encounter (Signed)
Message copied by Augusto Garbe on Tue Jan 28, 2011 11:50 AM ------      Message from: Reesa Chew      Created: Mon Jan 27, 2011  5:13 PM      Regarding: chemo follow up call       Taxol, Ledell Noss

## 2011-01-28 NOTE — Telephone Encounter (Signed)
Patient is doing well.  Denies nausea.  Has taken anti-emetics as prescribed.  Reports she is tired and did not sleep well last night.  Normally takes [place] but did not due to taking lorazepam.  Asked if she may take the [employer].  Also asked if she will experience any nausea since she has not so far.  Encouraged to drink water but reports low Na+ level.  Asked about a urine test drawn yesterday due to history of E-coli in her  Urine and being on cipro.  Asked that she call if any further questions or problems.

## 2011-01-29 ENCOUNTER — Telehealth: Payer: Self-pay

## 2011-01-29 ENCOUNTER — Telehealth: Payer: Self-pay | Admitting: *Deleted

## 2011-01-29 NOTE — Telephone Encounter (Signed)
REINFORCED WANT [name] IN TRIAGE TOLD [name] ABOUT URINE.  DID TELL HER THAT DR. Darrold Span WANTS HER TO PUSH THE GATORADE NOT JUST FREE FLUID D/T LOW SODIUM.  TOLD HER THAT DR. Darrold Span SAID THAT SHE CAN USE THE AMBIEN FOR SLEEP IF SHE HAS NOT USED THE ATIVAN FOR NAUSEA AT HS.  PT. APPRECIATED THE PHONE CALL.

## 2011-01-29 NOTE — Telephone Encounter (Signed)
Patient asking for results of her urine culture from 01/27/11. Reports she is still having "some urinary discomfort". No increase in frequency or burning, "just feels uncomfortable, and I hurt some down low in my stomach". No fever. Made her aware that urine culture returned no growth. She needs to continue to push po fluids.

## 2011-02-03 ENCOUNTER — Telehealth: Payer: Self-pay

## 2011-02-03 ENCOUNTER — Telehealth: Payer: Self-pay | Admitting: Oncology

## 2011-02-03 ENCOUNTER — Other Ambulatory Visit (HOSPITAL_BASED_OUTPATIENT_CLINIC_OR_DEPARTMENT_OTHER): Payer: Medicare Other | Admitting: Lab

## 2011-02-03 ENCOUNTER — Ambulatory Visit (HOSPITAL_BASED_OUTPATIENT_CLINIC_OR_DEPARTMENT_OTHER): Payer: Medicare Other | Admitting: Oncology

## 2011-02-03 VITALS — BP 174/93 | HR 87 | Temp 98.3°F | Ht 62.0 in | Wt 172.1 lb

## 2011-02-03 DIAGNOSIS — E871 Hypo-osmolality and hyponatremia: Secondary | ICD-10-CM

## 2011-02-03 DIAGNOSIS — R180 Malignant ascites: Secondary | ICD-10-CM

## 2011-02-03 DIAGNOSIS — D709 Neutropenia, unspecified: Secondary | ICD-10-CM

## 2011-02-03 DIAGNOSIS — C569 Malignant neoplasm of unspecified ovary: Secondary | ICD-10-CM

## 2011-02-03 DIAGNOSIS — C786 Secondary malignant neoplasm of retroperitoneum and peritoneum: Secondary | ICD-10-CM

## 2011-02-03 LAB — COMPREHENSIVE METABOLIC PANEL
ALT: 22 U/L (ref 0–35)
AST: 37 U/L (ref 0–37)
Albumin: 4 g/dL (ref 3.5–5.2)
Alkaline Phosphatase: 60 U/L (ref 39–117)
BUN: 17 mg/dL (ref 6–23)
CO2: 24 mEq/L (ref 19–32)
Calcium: 9.6 mg/dL (ref 8.4–10.5)
Chloride: 96 mEq/L (ref 96–112)
Creatinine, Ser: 1.03 mg/dL (ref 0.50–1.10)
Glucose, Bld: 118 mg/dL — ABNORMAL HIGH (ref 70–99)
Potassium: 5.1 mEq/L (ref 3.5–5.3)
Sodium: 131 mEq/L — ABNORMAL LOW (ref 135–145)
Total Bilirubin: 0.4 mg/dL (ref 0.3–1.2)
Total Protein: 7.2 g/dL (ref 6.0–8.3)

## 2011-02-03 LAB — CBC WITH DIFFERENTIAL/PLATELET
BASO%: 0.6 % (ref 0.0–2.0)
Basophils Absolute: 0 10*3/uL (ref 0.0–0.1)
EOS%: 5.1 % (ref 0.0–7.0)
Eosinophils Absolute: 0.1 10*3/uL (ref 0.0–0.5)
HCT: 34.3 % — ABNORMAL LOW (ref 34.8–46.6)
HGB: 11.5 g/dL — ABNORMAL LOW (ref 11.6–15.9)
LYMPH%: 38.3 % (ref 14.0–49.7)
MCH: 26.8 pg (ref 25.1–34.0)
MCHC: 33.5 g/dL (ref 31.5–36.0)
MCV: 79.8 fL (ref 79.5–101.0)
MONO#: 0.1 10*3/uL (ref 0.1–0.9)
MONO%: 4.9 % (ref 0.0–14.0)
NEUT#: 0.9 10*3/uL — ABNORMAL LOW (ref 1.5–6.5)
NEUT%: 51.1 % (ref 38.4–76.8)
Platelets: 294 10*3/uL (ref 145–400)
RBC: 4.29 10*6/uL (ref 3.70–5.45)
RDW: 14.8 % — ABNORMAL HIGH (ref 11.2–14.5)
WBC: 1.8 10*3/uL — ABNORMAL LOW (ref 3.9–10.3)
lymph#: 0.7 10*3/uL — ABNORMAL LOW (ref 0.9–3.3)

## 2011-02-03 LAB — CA 125: CA 125: 239.1 U/mL — ABNORMAL HIGH (ref 0.0–30.2)

## 2011-02-03 MED ORDER — PEGFILGRASTIM INJECTION 6 MG/0.6ML
6.0000 mg | Freq: Once | SUBCUTANEOUS | Status: AC
  Administered 2011-02-03: 6 mg via SUBCUTANEOUS
  Filled 2011-02-03: qty 0.6

## 2011-02-03 NOTE — Patient Instructions (Signed)
We will call you with electrolyte results when we get them back.  Call for temperature >= 100.5 or or you have symptoms of infection since your white blood cell count is low. Don't be around anyone who might be sick. You might get aching from the neulasta shot given today.  Use [phone] phone number any time that you call our office, so that someone will be sure to get the call  Stool softner is fine anytime that you need it, including just after chemo.

## 2011-02-03 NOTE — Telephone Encounter (Signed)
No note

## 2011-02-03 NOTE — Telephone Encounter (Signed)
Spoke with patient 1910 re CMET from today now resulted, with Na better and K+ WNL. Told her that she can alternate water and gatorade now. Patient appreciated call.

## 2011-02-05 ENCOUNTER — Telehealth: Payer: Self-pay | Admitting: *Deleted

## 2011-02-05 NOTE — Telephone Encounter (Signed)
Reviewed earlier call with Dr. Darrold Span. OK for pt to alternate Ibuprofen and Tylenol for pain. Will discuss this at office visit 02/07/11. Pt verbalized understanding.

## 2011-02-05 NOTE — Telephone Encounter (Signed)
Call from pt reporting severe lower back pain that began today. Has just taken Tylenol and used Beng[place], no relief yet. Asking if there is anything else she can do to treat this severe pain.  Suggested pt alternate Tylenol and Ibuprofen. Will review with Dr. Darrold Span for further instructions.

## 2011-02-06 NOTE — Progress Notes (Signed)
OFFICE PROGRESS NOTE Date of Visit 02-03-11 Physicians: P.Gehrig, K.Little, R.Taavon, J.Hayes, F.Alusio  INTERVAL HISTORY:   Patient is seen, together with husband and daughter, in continuing attention to her recently diagnosed gyn carcinoma, now having started neoadjuvant taxol/carboplatin chemotherapy on 01-27-2011.  Patient had presented with several weeks of diarrhea and abdominal bloating fall 2012, found to have ascites and peritoneal carcinomatosis by [place] and CT in Nov, then cytology of ascitic fluid showing malignancy consistent with ovarian or primary peritoneal carcinoma. She was seen by Dr.Gehrig, then referred here for neoadjuvant chemotherapy due to extent of involvement; if she has good response, she may be candidate for interval debulking after ~ 3 cycles. She had initial US paracentesis of 2.4 liters on Dec 2 and another 3/2 liters removed on 01-22-11. Repeat urine culture 01-27-11 had no growth, this in f/u of UTI treated by Dr Clarene Duke just prior to first chemotherapy. Patient was hyponatremic by initial labs here, thought related to drinking only water with malignant ascites and the paracenteses; she has been drinking gatorade as instructed since then.  Patient had no problems during chemotherapy administration, did use full decadron premed for taxol. The day after treatment she felt well other than voiding "every 15-20 min" large amounts. She had no significant taxol aches and no nausea, tho appetite has still been poor/ some taste changes/ slightly sore mouth now. She has eaten soup, rice pudding, applesauce and [employer]. She was constipated x 5 days after treatment despite miralax and prune juice, then multiple good BMs yesterday. We have discussed using stool softner +/- miralax daily after next treatment. She has not slept well despite ambien, not clear why. She had some vaginal itching, called Dr.Taavon's office and has improved with nystatin cream. Her abdomen is less  distended than prior to the chemotherapy. She is voiding more normally now. She has not had fever or other symptoms of infection and no bleeding. She is not extremely fatigued. She denies increased SOB. Remainder of full 10 point Review of Systems negative/ unchanged.  Objective:  Vital signs in last 24 hours:  BP 174/93  Pulse 87  Temp(Src) 98.3 F (36.8 C) (Oral)  Ht 5\' 2"  (1.575 m)  Wt 172 lb 1.6 oz (78.064 kg)  BMI 31.48 kg/m2 This weight is down from 194 lbs on 01-22-11. Alert, looks better overall today, mobile with minimal assistance in exam room. No alopecia. Speech fluent and appropriate. Seems to follow discussion a bit better today.  HEENT:mucous membranes moist, pharynx normal without lesions. PERRL, not icteric.  LymphaticsCervical, supraclavicular, and axillary nodes normal.No clear inguinal adenopathy. Resp: clear to auscultation bilaterally and normal percussion bilaterally.Respirations not labored RA. Cardio: regular rate and rhythm, clear heart sounds GI: soft, seems less distended and less full. Some bowel sounds normally active. No clear HSM . Nontender thruout. Extremities: extremities normal, atraumatic, no cyanosis or edema Neuro speech fluent, no focal deficits    Lab Results:  CBC today (day 8 cycle 1 taxol/carbo)WBC 1.8, ANC 0.9, Hgb 11.5, plt 294  BMET/CMET Na improved at 131, K 5.1, Cl 96, CO2 24, Glu 118, BUN 17, creat 1.0, TP 7.2, alb 4.0, Ca 9.6, remainder of CMET unremarkable Chemistries were discussed with patient by phone when available evening of visit. She was instructed to continue with gatorade for at least half of her fluid intake, but can drink some free water now. CA 125 available after visit 239, this having been 204 on 01-22-11.  Studies/Results:  No results found.  Medications: I  have reviewed the patient's current medications. For sore mouth, she will use baking soda mouth wash prn  Patient's counts are probably not nearly at  nadir today and she is already neutropenic. We have discussed neutropenic precautions as well as gCSF administration and possible side effects including bone pain. She agrees to [employer], given now. I will see her back with repeat labs 12-28 (CBC, stat Bmet and Mg) and again with lab on Jan 7, then second cycle of taxol/carbo anticipated Jan 8 if counts are adequate. She will need gCSF with subsequent cycles due to neutropenia now. 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 Clinically she seems to be improving the ascites since treatment and overall looks better even with low WBC. Plan as above. 2.Hyponatremia prior to chemo, a little better. Plan as above 3. UTI resolved. Probable vaginal yeast infection which may be related to steroids for chemo, better with nystatin cream which she will continue. 4. Hx SIADH with orthopedic surgery 5.Oral candida resolved 6. Multiple orthopedic procedures, complicated by staph infection, all resolved.  Patient and daughter were in agreement with plan and appreciative of care.        Reece Packer, MD   02/06/2011, 8:32 PM

## 2011-02-07 ENCOUNTER — Ambulatory Visit (HOSPITAL_BASED_OUTPATIENT_CLINIC_OR_DEPARTMENT_OTHER): Payer: Federal, State, Local not specified - PPO | Admitting: Oncology

## 2011-02-07 ENCOUNTER — Ambulatory Visit: Payer: Medicare Other | Admitting: Oncology

## 2011-02-07 ENCOUNTER — Ambulatory Visit (HOSPITAL_BASED_OUTPATIENT_CLINIC_OR_DEPARTMENT_OTHER): Payer: Medicare Other | Admitting: Lab

## 2011-02-07 VITALS — BP 143/83 | HR 97 | Temp 98.2°F | Ht 62.0 in | Wt 173.2 lb

## 2011-02-07 DIAGNOSIS — E878 Other disorders of electrolyte and fluid balance, not elsewhere classified: Secondary | ICD-10-CM

## 2011-02-07 DIAGNOSIS — C569 Malignant neoplasm of unspecified ovary: Secondary | ICD-10-CM

## 2011-02-07 DIAGNOSIS — Z79899 Other long term (current) drug therapy: Secondary | ICD-10-CM

## 2011-02-07 LAB — COMPREHENSIVE METABOLIC PANEL WITH GFR
ALT: 18 U/L (ref 0–35)
AST: 25 U/L (ref 0–37)
Albumin: 3.2 g/dL — ABNORMAL LOW (ref 3.5–5.2)
Alkaline Phosphatase: 124 U/L — ABNORMAL HIGH (ref 39–117)
BUN: 21 mg/dL (ref 6–23)
CO2: 25 meq/L (ref 19–32)
Calcium: 9.8 mg/dL (ref 8.4–10.5)
Chloride: 96 meq/L (ref 96–112)
Creatinine, Ser: 1.1 mg/dL (ref 0.50–1.10)
Glucose, Bld: 129 mg/dL — ABNORMAL HIGH (ref 70–99)
Potassium: 4.6 meq/L (ref 3.5–5.3)
Sodium: 131 meq/L — ABNORMAL LOW (ref 135–145)
Total Bilirubin: 0.3 mg/dL (ref 0.3–1.2)
Total Protein: 7.2 g/dL (ref 6.0–8.3)

## 2011-02-07 LAB — CBC WITH DIFFERENTIAL/PLATELET
BASO%: 0.1 % (ref 0.0–2.0)
Basophils Absolute: 0 10*3/uL (ref 0.0–0.1)
EOS%: 1 % (ref 0.0–7.0)
Eosinophils Absolute: 0.2 10*3/uL (ref 0.0–0.5)
HCT: 34.4 % — ABNORMAL LOW (ref 34.8–46.6)
HGB: 11.4 g/dL — ABNORMAL LOW (ref 11.6–15.9)
LYMPH%: 6 % — ABNORMAL LOW (ref 14.0–49.7)
MCH: 26.8 pg (ref 25.1–34.0)
MCHC: 33.1 g/dL (ref 31.5–36.0)
MCV: 80.9 fL (ref 79.5–101.0)
MONO#: 1.2 10*3/uL — ABNORMAL HIGH (ref 0.1–0.9)
MONO%: 5.5 % (ref 0.0–14.0)
NEUT#: 19.3 10*3/uL — ABNORMAL HIGH (ref 1.5–6.5)
NEUT%: 87.4 % — ABNORMAL HIGH (ref 38.4–76.8)
Platelets: 199 10*3/uL (ref 145–400)
RBC: 4.25 10*6/uL (ref 3.70–5.45)
RDW: 15.7 % — ABNORMAL HIGH (ref 11.2–14.5)
WBC: 22 10*3/uL — ABNORMAL HIGH (ref 3.9–10.3)
lymph#: 1.3 10*3/uL (ref 0.9–3.3)

## 2011-02-07 LAB — CA 125: CA 125: 132.9 U/mL — ABNORMAL HIGH (ref 0.0–30.2)

## 2011-02-07 LAB — MAGNESIUM: Magnesium: 1.1 mg/dL — ABNORMAL LOW (ref 1.5–2.5)

## 2011-02-08 NOTE — Progress Notes (Signed)
OFFICE PROGRESS NOTE Date of Visit:  02-07-2011 Physicians: P.Gehrig, K.Little, R.Taavon, J.Hayes, F.Alusio  INTERVAL HISTORY:   Patient is seen, together with husband and daughter, in continung attention to her recently diagnosed gyn carcinoma, with first cycle of neoadjuvant taxol/carboplatin given 01-27-11. She was neutropenic by day 8 cycle 1, with neulasta given then (02-03-11). She had severe aches low back and less prominently in anterior chest beginning ~ 2 days after neulasta, these now resolved, and WBC is up well today. She has otherwise felt much better for past 2 days, with normal bowel movements, appetite and po intake much better, increased energy (has fixed her own meals and done light housework) and further decrease in abdominal distension. She has been drinking gatorade and juice in addition to water. She has had no fever or symptoms of infection. Soreness in mouth has resolved. She still has some mild vaginal itching despite nystatin/triamcinolone cream x 1 week; will change this to nystatin only. Voiding seems normal now. She does not complain of peripheral neuropathy, has slept better, has not started to lose hair.  Present illness began with several weeks of diarrhea and abdominal bloating fall 2012,with patient  found to have ascites and peritoneal carcinomatosis by [place] and CT in Nov, then cytology of ascitic fluid showing malignancy consistent with ovarian or primary peritoneal carcinoma. She was seen by Dr.Gehrig, then referred here for neoadjuvant chemotherapy due to extent of involvement; if she has good response, she may be candidate for interval debulking after ~ 3 cycles. She had initial US paracentesis of 2.4 liters on Dec 2 and another 3/2 liters removed on 01-22-11.  Objective:  Vital signs in last 24 hours:  BP 143/83  Pulse 97  Temp(Src) 98.2 F (36.8 C) (Oral)  Ht 5\' 2"  (1.575 m)  Wt 173 lb 3.2 oz (78.563 kg)  BMI 31.68 kg/m2 This weight is up 1 lb from 12-24  but down from 194 shortly prior to start of chemotherapy/ prior to the second paracentesis.    HEENT:mucous membranes moist, pharynx normal without lesions LymphaticsCervical, supraclavicular, and axillary nodes normal. Resp: clear to auscultation bilaterally and normal percussion bilaterally Cardio: regular rate and rhythm GI: softer than prior to first chemo and no more distended than when she was seen 12-24. Bowel sounds present and normally active. Nontender. No HSM. Extremities: extremities normal, atraumatic, no cyanosis or edema Neuro nonfocal. Speech fluent, seems to follow conversation more easily.    Lab Results:   [facility] 02/07/11 1513  WBC 22.0*  HGB 11.4*  HCT 34.4*  PLT 199   ANC 19.3 post neulasta BMET/CMET  Basename 02/07/11 1513  NA 131*  K 4.6  CL 96  CO2 25  GLUCOSE 129*  BUN 21  CREATININE 1.10  CALCIUM 9.8  AP 124, alb 3.2, remainder of full CMET WNL Mg++ low at 1.1 CA 125 available after visit 133, this having been 239 on 12-24 Studies/Results:  No results found.  Medications: I have reviewed the patient's current medications.  Assessment/Plan:  1. Serous adenocarcinoma of gyn primary: clinically responding already to cycle 1 of neoadjuvant taxol/carbo. I will see her back at least Jan 7, with cycle 2 planned for Jan 8 if counts are adequate. She may tolerate neupogen better than neulasta from standpoint of aches; note she did not have significant aches from the taxol. Premed decadron called to her pharmacy today.  We reviewed Dr.Gehrig's plan for consideration of surgery after ~ 3 cycles of chemotherapy. 2.Electrolyte abnormalities: Na better but still  needs some gatorade instead of just free water. K+ good but Mg++ low so will try po mag oxide ~ 400 mg daily as long as this does not cause diarrhea (note constipation was a problem after first chemo, so this may help) 3. Oral candida resolved. Continue nystatin cream.  Patient and family had  questions answered to their satisfaction and are pleased with her improvement.        [name] P, MD   02/08/2011, 2:27 PM

## 2011-02-09 ENCOUNTER — Other Ambulatory Visit: Payer: Self-pay

## 2011-02-09 DIAGNOSIS — C569 Malignant neoplasm of unspecified ovary: Secondary | ICD-10-CM

## 2011-02-09 MED ORDER — DEXAMETHASONE 4 MG PO TABS: ORAL_TABLET | ORAL | Status: DC

## 2011-02-09 MED ORDER — NYSTATIN 100000 UNIT/GM EX CREA: 1.0000 "application " | TOPICAL_CREAM | Freq: Three times a day (TID) | CUTANEOUS | Status: DC | PRN

## 2011-02-10 ENCOUNTER — Other Ambulatory Visit: Payer: Self-pay

## 2011-02-10 MED ORDER — MAGNESIUM OXIDE 400 MG PO TABS: 400.0000 mg | ORAL_TABLET | Freq: Every day | ORAL | Status: DC

## 2011-02-10 NOTE — Telephone Encounter (Signed)
SPOKE WITH [name] AND TOLD HER THAT HER MAGNESIUM LEVEL ON 02-07-11 WAS LOW AT 1.1.  THIS COULD BE STILL  LOW FROM HER RECENT  DIARRHEA.   DR. Darrold Span WANTS HER TO TAKE MAG-OXIDE  400 MG DAILY. SHE IS TO  HOLD THE MAG-OX IF SHE DEVELOPS  DIARRHEA AND CALL THE OFFICE.   PT. VERBALIZED UNDERSTANDING. HER SODIUM LEVEL WAS STABLE.  SHE NEEDS TO CONTINUE HALF OF HER FLUIDS TO BE GATORADE.  SHE ALSO STATED THAT SHE HAD SOME SCABS IN BOTH  OF HER NARES THAT WERE NOT GOING AWAY.  SHE SAW DR. Caryn Bee LITTLE TODAY WHO DID A CULTURE.  HE SAID IT COULD BE MRSA OR STAFF INFECTION.  HE IS TREATING IT AS A SATFF INFECTION. HE GAVE HER DOXYCYCLINE 100 MG DAILY FOR 10 DAYS.  SHE IS ALSO TO APPLY MUPIROCIN 2 % OINTMENT TO NARES TID.

## 2011-02-11 ENCOUNTER — Other Ambulatory Visit: Payer: Self-pay | Admitting: Oncology

## 2011-02-13 ENCOUNTER — Telehealth: Payer: Self-pay

## 2011-02-13 DIAGNOSIS — H3581 Retinal edema: Secondary | ICD-10-CM | POA: Diagnosis not present

## 2011-02-13 DIAGNOSIS — H348392 Tributary (branch) retinal vein occlusion, unspecified eye, stable: Secondary | ICD-10-CM | POA: Diagnosis not present

## 2011-02-13 DIAGNOSIS — H35049 Retinal micro-aneurysms, unspecified, unspecified eye: Secondary | ICD-10-CM | POA: Diagnosis not present

## 2011-02-13 DIAGNOSIS — H3560 Retinal hemorrhage, unspecified eye: Secondary | ICD-10-CM | POA: Diagnosis not present

## 2011-02-13 DIAGNOSIS — H538 Other visual disturbances: Secondary | ICD-10-CM | POA: Diagnosis not present

## 2011-02-13 DIAGNOSIS — H11009 Unspecified pterygium of unspecified eye: Secondary | ICD-10-CM | POA: Diagnosis not present

## 2011-02-13 DIAGNOSIS — H5310 Unspecified subjective visual disturbances: Secondary | ICD-10-CM | POA: Diagnosis not present

## 2011-02-13 NOTE — Telephone Encounter (Signed)
LEFT MESSAGE WITH RECEPTIONIST FOR [facility]E THAT PT. RECEIVED TAOL AND CARBOPLATIN ON 01-27-11.  CYCLE 1 DAY 1.  HER NEXT TREATMENT IS SCHEDULED FOR 02-17-10.

## 2011-02-14 ENCOUNTER — Telehealth: Payer: Self-pay

## 2011-02-14 NOTE — Telephone Encounter (Signed)
SPOKE WITH MS. Eppard AND TOLD HER THAT THE RECORDS WERE RECEIVED TODAY REGARDING HER VISIT WITH DR. RANKIN ON 02-13-11 FOR HER APPT. WITH DR. Darrold Span ON 02-17-11. PT. HAS RETINAL VEIN OCCLUSION IN HER LEFT EYE.  PT. IS TO BEGIN INJECTIONS INTO LEFT EYE  ON ON 02-20-11 WITH LUCENTIS.   PT. WANTED TO BE SURE THIS WOULD BE OK WITH HER TREATMENT 02-18-11.

## 2011-02-17 ENCOUNTER — Telehealth: Payer: Self-pay | Admitting: Oncology

## 2011-02-17 ENCOUNTER — Other Ambulatory Visit: Payer: Self-pay | Admitting: Oncology

## 2011-02-17 ENCOUNTER — Other Ambulatory Visit (HOSPITAL_BASED_OUTPATIENT_CLINIC_OR_DEPARTMENT_OTHER): Payer: Medicare Other | Admitting: Lab

## 2011-02-17 ENCOUNTER — Ambulatory Visit: Payer: Medicare Other

## 2011-02-17 ENCOUNTER — Ambulatory Visit (HOSPITAL_BASED_OUTPATIENT_CLINIC_OR_DEPARTMENT_OTHER): Payer: Medicare Other | Admitting: Oncology

## 2011-02-17 VITALS — BP 158/91 | HR 89 | Temp 98.0°F | Ht 62.0 in | Wt 170.9 lb

## 2011-02-17 DIAGNOSIS — E871 Hypo-osmolality and hyponatremia: Secondary | ICD-10-CM

## 2011-02-17 DIAGNOSIS — C569 Malignant neoplasm of unspecified ovary: Secondary | ICD-10-CM

## 2011-02-17 DIAGNOSIS — B958 Unspecified staphylococcus as the cause of diseases classified elsewhere: Secondary | ICD-10-CM | POA: Diagnosis not present

## 2011-02-17 DIAGNOSIS — Z79899 Other long term (current) drug therapy: Secondary | ICD-10-CM | POA: Diagnosis not present

## 2011-02-17 DIAGNOSIS — C801 Malignant (primary) neoplasm, unspecified: Secondary | ICD-10-CM | POA: Diagnosis not present

## 2011-02-17 LAB — CBC WITH DIFFERENTIAL/PLATELET
BASO%: 0.9 % (ref 0.0–2.0)
Basophils Absolute: 0.1 10*3/uL (ref 0.0–0.1)
EOS%: 1.9 % (ref 0.0–7.0)
Eosinophils Absolute: 0.1 10*3/uL (ref 0.0–0.5)
HCT: 33.8 % — ABNORMAL LOW (ref 34.8–46.6)
HGB: 11.5 g/dL — ABNORMAL LOW (ref 11.6–15.9)
LYMPH%: 20.3 % (ref 14.0–49.7)
MCH: 27.9 pg (ref 25.1–34.0)
MCHC: 34.1 g/dL (ref 31.5–36.0)
MCV: 81.7 fL (ref 79.5–101.0)
MONO#: 0.8 10*3/uL (ref 0.1–0.9)
MONO%: 10.3 % (ref 0.0–14.0)
NEUT#: 5.1 10*3/uL (ref 1.5–6.5)
NEUT%: 66.6 % (ref 38.4–76.8)
Platelets: 227 10*3/uL (ref 145–400)
RBC: 4.14 10*6/uL (ref 3.70–5.45)
RDW: 15.9 % — ABNORMAL HIGH (ref 11.2–14.5)
WBC: 7.6 10*3/uL (ref 3.9–10.3)
lymph#: 1.5 10*3/uL (ref 0.9–3.3)

## 2011-02-17 LAB — BASIC METABOLIC PANEL
BUN: 18 mg/dL (ref 6–23)
CO2: 24 mEq/L (ref 19–32)
Calcium: 9.6 mg/dL (ref 8.4–10.5)
Chloride: 100 mEq/L (ref 96–112)
Creatinine, Ser: 1.05 mg/dL (ref 0.50–1.10)
Glucose, Bld: 121 mg/dL — ABNORMAL HIGH (ref 70–99)
Potassium: 4.9 mEq/L (ref 3.5–5.3)
Sodium: 134 mEq/L — ABNORMAL LOW (ref 135–145)

## 2011-02-17 LAB — MAGNESIUM: Magnesium: 1.4 mg/dL — ABNORMAL LOW (ref 1.5–2.5)

## 2011-02-17 NOTE — Progress Notes (Signed)
OFFICE PROGRESS NOTE                                              Date of Visit Feb 16, 2010  Physicians:P.Gehrig, K.Little,R.Taavon, J.Hayes, G.Rankin    INTERVAL HISTORY:   Patient is seen, together with husband, in continuing attention to her gyn carcinoma, which is being treated with neoadjuvant chemotherapy. She has clearly felt better overall since cycle 1 taxol/carboplatin was given 01-27-11, even with neutropenia by day 8, severe neulasta aches, and constipation after chemotherapy. Additionally she has now had staph infection in nose (not MRSA) responding to topical antibiotic by Dr.Little, resolution of vaginal itching with topical nystatin, and new retinal problem as of Jan 4 which has been evaluated by Dr.Grote and Dr.Rankin. She is scheduled for Lucentis injection by Dr.Rankin on Jan 10. Otherwise her energy and appetite are much better, abdomen is less distended and bowels have been moving normally. She has had no fever, no bleeding, denies peripheral neuropathy symptoms, has lost her hair. She sleeps from ~ 8 pm to 1am with ambien. She denies cough, increased shortness of breath, LE swelling, dysuria. She is still drinking gatorade as half of her liquid intake.  Remainder of full 10 point Review of Systems unchanged/ negative  Objective:  Vital signs in last 24 hours:  BP 158/91  Pulse 89  Temp(Src) 98 F (36.7 C) (Oral)  Ht 5\' 2"  (1.575 m)  Wt 170 lb 14.4 oz (77.52 kg)  BMI 31.26 kg/m2 This weight is down 3 lbs.  Alert, talkative, ambulatory without assistance, looks more relaxed and comfortable.  HEENT:mucous membranes moist, pharynx normal without lesions. Eyes sensitive to light even wearing sunglasses. Nasal septum slightly erythematous and puffy left > right. LymphaticsCervical, supraclavicular, and axillary nodes normal. Resp: clear to auscultation bilaterally and normal percussion bilaterally Cardio: regular rate and rhythm GI: soft, nontender, less distended than  initially, normally active bowel sounds, no appreciable HSM Extremities: extremities normal, atraumatic, no cyanosis or edema Skin without rash or ecchymosis     Lab Results:   Basename 02/17/11 1408  WBC 7.6  HGB 11.5*  HCT 33.8*  PLT 227  ANC 5.1  BMET  Basename 02/17/11 1408  NA 134*  K 4.9  CL 100  CO2 24  GLUCOSE 121*  BUN 18  CREATININE 1.05  CALCIUM 9.6   Mg 1.4, this up from 1.1 last week, on oral magnesium  Last Ca 125 on 12-28 was 133, down from 239 prior to cycle 1 Studies/Results:  No results found.  Medications: I have reviewed the patient's current medications. I have also given patient oral and written instructions for premedication decadron (20 mg with food 12 hrs and 6 hrs prior to chemo), for use of stool softner &/or miralax beginning evening of chemo x several days to keep bowels moving daily, and changed instructions for antiemetics such that she will use ativan night of chemo only if nauseated/ can use ambien that night if no ativan but will use zofran am after chemo whether or not nausea. She will not begin neupogen until Jan 11 due to ophthalmologic procedure Jan 10. Neupogen will be used Jan 11,12 and 14, then she will have CBC checked with my visit Jan 18.  Assessment/Plan:  1. Serous adenocarcinoma of gyn primary: clinically responding to first cycle of neoadjuvant chemotherapy. She will proceed with cycle 2  taxol/caarboplatin on Jan 8, then neupogen as above beginning Jan 11. I will see her next at least Jan 18 with CBC,cmet and CA 125 then 2. Retinal problem: I have spoken to staff at Dr.Rankin's office and LM there re chemotherapy in process. Procedure planned Jan 10. 3. Nasal staph infection, improving. 4. Hyponatremia, gradually improving and will let her know to  decrease gatorade  5. Hypomagnesemia also improving, continue po magnesium   Patient and husband were comfortable with plan and appreciative of care.     Reece Packer,  MD   02/17/2011, 8:34 PM

## 2011-02-17 NOTE — Telephone Encounter (Signed)
Phone message from pt 02-14-11 re ophth rx with lucentis noted. I LM on clinical phone line for Dr.Rankin's office to be sure they were aware that she is on chemotherapy; I do not find contraindications yes in my literature but wanted to be sure he is aware of the chemo.

## 2011-02-17 NOTE — Patient Instructions (Signed)
Jan 8: eat then take 5 decadron tablets.at 1am.  Again at 7am, eat and take 5 decadron tablets.  Chemo will be Jan 8 at 1pm  Neupogen injections for white blood cells will be Fri. Jan 11, Sat Jan 12 and Mon Jan 14.  You will need to use stool softner and/or miralax beginning evening of chemotherapy and for the next several days after chemo, to be sure bowels move daily.  You do not need to use nausea medication night of chemo unless you are nauseated; you can use your ambien at bedtime.  The morning after chemo, take zofran (ondansetron) 8 mg once, whether or not you are nauseated.

## 2011-02-18 ENCOUNTER — Ambulatory Visit (HOSPITAL_BASED_OUTPATIENT_CLINIC_OR_DEPARTMENT_OTHER): Payer: Medicare Other

## 2011-02-18 ENCOUNTER — Other Ambulatory Visit: Payer: Self-pay | Admitting: Hematology and Oncology

## 2011-02-18 ENCOUNTER — Telehealth: Payer: Self-pay

## 2011-02-18 ENCOUNTER — Other Ambulatory Visit: Payer: Self-pay

## 2011-02-18 ENCOUNTER — Other Ambulatory Visit: Payer: Self-pay | Admitting: Oncology

## 2011-02-18 ENCOUNTER — Telehealth: Payer: Self-pay | Admitting: Oncology

## 2011-02-18 VITALS — BP 147/86 | HR 91 | Temp 97.4°F

## 2011-02-18 DIAGNOSIS — C569 Malignant neoplasm of unspecified ovary: Secondary | ICD-10-CM

## 2011-02-18 MED ORDER — PACLITAXEL CHEMO INJECTION 300 MG/50ML
135.0000 mg/m2 | Freq: Once | INTRAVENOUS | Status: DC
  Filled 2011-02-18: qty 44

## 2011-02-18 MED ORDER — SODIUM CHLORIDE 0.9 % IV SOLN
432.5000 mg | Freq: Once | INTRAVENOUS | Status: DC
  Filled 2011-02-18: qty 43

## 2011-02-18 MED ORDER — DEXAMETHASONE SODIUM PHOSPHATE 4 MG/ML IJ SOLN
20.0000 mg | Freq: Once | INTRAMUSCULAR | Status: AC
  Administered 2011-02-18: 20 mg via INTRAVENOUS

## 2011-02-18 MED ORDER — DEXAMETHASONE 4 MG PO TABS: ORAL_TABLET | ORAL | Status: DC

## 2011-02-18 MED ORDER — SODIUM CHLORIDE 0.9 % IV SOLN
Freq: Once | INTRAVENOUS | Status: AC
  Administered 2011-02-18: 13:00:00 via INTRAVENOUS

## 2011-02-18 MED ORDER — FAMOTIDINE IN NACL 20-0.9 MG/50ML-% IV SOLN
20.0000 mg | Freq: Once | INTRAVENOUS | Status: AC
  Administered 2011-02-18: 20 mg via INTRAVENOUS

## 2011-02-18 MED ORDER — ONDANSETRON 16 MG/50ML IVPB (CHCC)
16.0000 mg | Freq: Once | INTRAVENOUS | Status: AC
  Administered 2011-02-18: 16 mg via INTRAVENOUS

## 2011-02-18 MED ORDER — DIPHENHYDRAMINE HCL 50 MG/ML IJ SOLN
50.0000 mg | Freq: Once | INTRAMUSCULAR | Status: AC
  Administered 2011-02-18: 50 mg via INTRAVENOUS

## 2011-02-18 NOTE — Telephone Encounter (Signed)
MD spoke to RNs and scheduler, then to patient by phone now re planned chemotherapy not administered today; I have apologized to patient for the situation. Patient is understandably upset, but does want to be treated tomorrow am (rescheduled to 0845) rather than delaying chemotherapy until Mon Jan 14 (after retinal procedure on Jan 10). She understands that she will need to repeat decadron premedication, this called in to [facility], which is open until 9pm. I have instructed her to take decadron at 2030 and 0230 tonight. She does not have any injection at [facility] on Jan 10, but will keep appts as scheduled for Jan 11,12 and 14.  Scheduler aware that I have explained this to [name]. Patient appreciated call.

## 2011-02-18 NOTE — Telephone Encounter (Signed)
[name] FROM DR. RANKIN'S OFFICE CALLED TO LET DR. Darrold Span KNOW THAT DR. RANKIN SAID THAT PT. CAN RECEIVE THE INJECTION INTO HER EYE WHILE SHE IS ON CHEMOTHERAPY.

## 2011-02-19 ENCOUNTER — Ambulatory Visit (HOSPITAL_BASED_OUTPATIENT_CLINIC_OR_DEPARTMENT_OTHER): Payer: Medicare Other

## 2011-02-19 ENCOUNTER — Ambulatory Visit: Payer: Medicare Other

## 2011-02-19 ENCOUNTER — Other Ambulatory Visit: Payer: Self-pay | Admitting: Oncology

## 2011-02-19 VITALS — BP 167/85 | HR 72 | Temp 97.4°F

## 2011-02-19 DIAGNOSIS — C801 Malignant (primary) neoplasm, unspecified: Secondary | ICD-10-CM

## 2011-02-19 DIAGNOSIS — C569 Malignant neoplasm of unspecified ovary: Secondary | ICD-10-CM

## 2011-02-19 DIAGNOSIS — C786 Secondary malignant neoplasm of retroperitoneum and peritoneum: Secondary | ICD-10-CM | POA: Diagnosis not present

## 2011-02-19 DIAGNOSIS — Z5111 Encounter for antineoplastic chemotherapy: Secondary | ICD-10-CM | POA: Diagnosis not present

## 2011-02-19 MED ORDER — PACLITAXEL CHEMO INJECTION 300 MG/50ML
135.0000 mg/m2 | Freq: Once | INTRAVENOUS | Status: AC
  Administered 2011-02-19: 264 mg via INTRAVENOUS
  Filled 2011-02-19: qty 44

## 2011-02-19 MED ORDER — SODIUM CHLORIDE 0.9 % IV SOLN
432.5000 mg | Freq: Once | INTRAVENOUS | Status: AC
  Administered 2011-02-19: 430 mg via INTRAVENOUS
  Filled 2011-02-19: qty 43

## 2011-02-19 MED ORDER — DEXAMETHASONE SODIUM PHOSPHATE 4 MG/ML IJ SOLN
20.0000 mg | Freq: Once | INTRAMUSCULAR | Status: AC
  Administered 2011-02-19: 20 mg via INTRAVENOUS

## 2011-02-19 MED ORDER — DIPHENHYDRAMINE HCL 50 MG/ML IJ SOLN
50.0000 mg | Freq: Once | INTRAMUSCULAR | Status: AC
  Administered 2011-02-19: 50 mg via INTRAVENOUS

## 2011-02-19 MED ORDER — ONDANSETRON 16 MG/50ML IVPB (CHCC)
16.0000 mg | Freq: Once | INTRAVENOUS | Status: AC
  Administered 2011-02-19: 16 mg via INTRAVENOUS

## 2011-02-19 MED ORDER — SODIUM CHLORIDE 0.9 % IV SOLN
Freq: Once | INTRAVENOUS | Status: AC
  Administered 2011-02-19: 09:00:00 via INTRAVENOUS

## 2011-02-19 MED ORDER — FAMOTIDINE IN NACL 20-0.9 MG/50ML-% IV SOLN
20.0000 mg | Freq: Once | INTRAVENOUS | Status: AC
  Administered 2011-02-19: 20 mg via INTRAVENOUS

## 2011-02-19 NOTE — Patient Instructions (Signed)
Patient tolerated treatment without any adverse events. Patient aware of future appointments of injections. Patient verbalized understanding. Patient discharged to lobby; ambulatory at time of discharge.

## 2011-02-20 ENCOUNTER — Ambulatory Visit: Payer: Medicare Other

## 2011-02-20 ENCOUNTER — Other Ambulatory Visit: Payer: Self-pay

## 2011-02-20 ENCOUNTER — Telehealth: Payer: Self-pay | Admitting: *Deleted

## 2011-02-20 DIAGNOSIS — H3550 Unspecified hereditary retinal dystrophy: Secondary | ICD-10-CM | POA: Diagnosis not present

## 2011-02-20 DIAGNOSIS — H2510 Age-related nuclear cataract, unspecified eye: Secondary | ICD-10-CM | POA: Diagnosis not present

## 2011-02-20 DIAGNOSIS — H348392 Tributary (branch) retinal vein occlusion, unspecified eye, stable: Secondary | ICD-10-CM | POA: Diagnosis not present

## 2011-02-20 NOTE — Telephone Encounter (Signed)
Returned call: No fever or trouble swallowing. Instructed her to take Tylenol prn, gargle with warm salt water and use OTC sore throat spray/lozenges. Push po fluids. Call if she develops fever. Reminded her that her counts were good for her treatment yesterday and she will be getting Neupogen tomorrow and nurse will assess her then. Her sore throat is probably viral and may just need to run it's course--nothing really to prevent it. No nausea, eating well, no bowel issues.

## 2011-02-20 NOTE — Telephone Encounter (Signed)
Patient called to report sore throat and asking what to do to prevent it from getting worse?

## 2011-02-20 NOTE — Telephone Encounter (Signed)
Refill request rec'd on e-prescribe for pt.'s decadron.  [facility]ed [facility] and spoke with Revonda Standard, who states they have already rec'd this request by phone on 02/19/11.  E-prescribe request refused.

## 2011-02-21 ENCOUNTER — Ambulatory Visit (HOSPITAL_BASED_OUTPATIENT_CLINIC_OR_DEPARTMENT_OTHER): Payer: Medicare Other

## 2011-02-21 VITALS — BP 152/80 | HR 76 | Temp 98.4°F

## 2011-02-21 DIAGNOSIS — C786 Secondary malignant neoplasm of retroperitoneum and peritoneum: Secondary | ICD-10-CM

## 2011-02-21 DIAGNOSIS — D702 Other drug-induced agranulocytosis: Secondary | ICD-10-CM

## 2011-02-21 DIAGNOSIS — C569 Malignant neoplasm of unspecified ovary: Secondary | ICD-10-CM

## 2011-02-21 DIAGNOSIS — C801 Malignant (primary) neoplasm, unspecified: Secondary | ICD-10-CM

## 2011-02-21 MED ORDER — FILGRASTIM 300 MCG/0.5ML IJ SOLN
300.0000 ug | Freq: Once | INTRAMUSCULAR | Status: AC
  Administered 2011-02-21: 300 ug via SUBCUTANEOUS
  Filled 2011-02-21: qty 0.5

## 2011-02-22 ENCOUNTER — Ambulatory Visit (HOSPITAL_BASED_OUTPATIENT_CLINIC_OR_DEPARTMENT_OTHER): Payer: Medicare Other

## 2011-02-22 VITALS — BP 137/81 | HR 82 | Temp 99.7°F

## 2011-02-22 DIAGNOSIS — C801 Malignant (primary) neoplasm, unspecified: Secondary | ICD-10-CM

## 2011-02-22 DIAGNOSIS — C786 Secondary malignant neoplasm of retroperitoneum and peritoneum: Secondary | ICD-10-CM

## 2011-02-22 DIAGNOSIS — C569 Malignant neoplasm of unspecified ovary: Secondary | ICD-10-CM

## 2011-02-22 MED ORDER — FILGRASTIM 300 MCG/0.5ML IJ SOLN
300.0000 ug | Freq: Once | INTRAMUSCULAR | Status: AC
  Administered 2011-02-22: 300 ug via SUBCUTANEOUS

## 2011-02-24 ENCOUNTER — Ambulatory Visit (HOSPITAL_BASED_OUTPATIENT_CLINIC_OR_DEPARTMENT_OTHER): Payer: Medicare Other

## 2011-02-24 VITALS — BP 158/88 | HR 82 | Temp 99.0°F

## 2011-02-24 DIAGNOSIS — Z5189 Encounter for other specified aftercare: Secondary | ICD-10-CM | POA: Diagnosis not present

## 2011-02-24 DIAGNOSIS — C569 Malignant neoplasm of unspecified ovary: Secondary | ICD-10-CM | POA: Diagnosis not present

## 2011-02-24 MED ORDER — FILGRASTIM 300 MCG/0.5ML IJ SOLN
300.0000 ug | Freq: Once | INTRAMUSCULAR | Status: AC
  Administered 2011-02-24: 300 ug via SUBCUTANEOUS
  Filled 2011-02-24: qty 0.5

## 2011-02-25 ENCOUNTER — Ambulatory Visit: Payer: Medicare Other

## 2011-02-28 ENCOUNTER — Other Ambulatory Visit: Payer: Medicare Other

## 2011-02-28 ENCOUNTER — Telehealth: Payer: Self-pay | Admitting: Oncology

## 2011-02-28 ENCOUNTER — Ambulatory Visit (HOSPITAL_BASED_OUTPATIENT_CLINIC_OR_DEPARTMENT_OTHER): Payer: Medicare Other | Admitting: Oncology

## 2011-02-28 VITALS — BP 152/84 | HR 76 | Temp 97.7°F | Ht 62.0 in | Wt 169.3 lb

## 2011-02-28 DIAGNOSIS — C569 Malignant neoplasm of unspecified ovary: Secondary | ICD-10-CM

## 2011-02-28 DIAGNOSIS — Z5189 Encounter for other specified aftercare: Secondary | ICD-10-CM | POA: Diagnosis not present

## 2011-02-28 DIAGNOSIS — D709 Neutropenia, unspecified: Secondary | ICD-10-CM | POA: Diagnosis not present

## 2011-02-28 LAB — CBC WITH DIFFERENTIAL/PLATELET
BASO%: 2 % (ref 0.0–2.0)
Basophils Absolute: 0.1 10*3/uL (ref 0.0–0.1)
EOS%: 3.6 % (ref 0.0–7.0)
Eosinophils Absolute: 0.1 10*3/uL (ref 0.0–0.5)
HCT: 30.8 % — ABNORMAL LOW (ref 34.8–46.6)
HGB: 10.2 g/dL — ABNORMAL LOW (ref 11.6–15.9)
LYMPH%: 41.8 % (ref 14.0–49.7)
MCH: 27.1 pg (ref 25.1–34.0)
MCHC: 33.1 g/dL (ref 31.5–36.0)
MCV: 81.9 fL (ref 79.5–101.0)
MONO#: 0.8 10*3/uL (ref 0.1–0.9)
MONO%: 31.7 % — ABNORMAL HIGH (ref 0.0–14.0)
NEUT#: 0.5 10*3/uL — ABNORMAL LOW (ref 1.5–6.5)
NEUT%: 20.9 % — ABNORMAL LOW (ref 38.4–76.8)
Platelets: 212 10*3/uL (ref 145–400)
RBC: 3.76 10*6/uL (ref 3.70–5.45)
RDW: 18.6 % — ABNORMAL HIGH (ref 11.2–14.5)
WBC: 2.5 10*3/uL — ABNORMAL LOW (ref 3.9–10.3)
lymph#: 1 10*3/uL (ref 0.9–3.3)
nRBC: 0 % (ref 0–0)

## 2011-02-28 LAB — COMPREHENSIVE METABOLIC PANEL
ALT: 14 U/L (ref 0–35)
AST: 21 U/L (ref 0–37)
Albumin: 4 g/dL (ref 3.5–5.2)
Alkaline Phosphatase: 64 U/L (ref 39–117)
BUN: 17 mg/dL (ref 6–23)
CO2: 24 mEq/L (ref 19–32)
Calcium: 10 mg/dL (ref 8.4–10.5)
Chloride: 103 mEq/L (ref 96–112)
Creatinine, Ser: 0.96 mg/dL (ref 0.50–1.10)
Glucose, Bld: 110 mg/dL — ABNORMAL HIGH (ref 70–99)
Potassium: 5.1 mEq/L (ref 3.5–5.3)
Sodium: 137 mEq/L (ref 135–145)
Total Bilirubin: 0.3 mg/dL (ref 0.3–1.2)
Total Protein: 6.8 g/dL (ref 6.0–8.3)

## 2011-02-28 LAB — CA 125: CA 125: 12.6 U/mL (ref 0.0–30.2)

## 2011-02-28 MED ORDER — FILGRASTIM 300 MCG/0.5ML IJ SOLN
300.0000 ug | Freq: Once | INTRAMUSCULAR | Status: AC
  Administered 2011-02-28: 300 ug via SUBCUTANEOUS
  Filled 2011-02-28: qty 0.5

## 2011-02-28 NOTE — Telephone Encounter (Signed)
appts printed for pt for 1/19 2/8,md and ct,to call pt 1/21 with gehrig appt   aom

## 2011-02-28 NOTE — Telephone Encounter (Signed)
Spoke with patient to let her know CA 125 today is down to 12.6 and Na++ is normal.

## 2011-02-28 NOTE — Telephone Encounter (Signed)
Called [name] , left message, pt needs appt with Dr Rica Records, waiting for appt date

## 2011-03-01 ENCOUNTER — Ambulatory Visit (HOSPITAL_BASED_OUTPATIENT_CLINIC_OR_DEPARTMENT_OTHER): Payer: Medicare Other

## 2011-03-01 VITALS — BP 122/69 | HR 84 | Temp 98.2°F

## 2011-03-01 DIAGNOSIS — C786 Secondary malignant neoplasm of retroperitoneum and peritoneum: Secondary | ICD-10-CM | POA: Diagnosis not present

## 2011-03-01 DIAGNOSIS — C569 Malignant neoplasm of unspecified ovary: Secondary | ICD-10-CM

## 2011-03-01 DIAGNOSIS — D709 Neutropenia, unspecified: Secondary | ICD-10-CM | POA: Diagnosis not present

## 2011-03-01 DIAGNOSIS — C801 Malignant (primary) neoplasm, unspecified: Secondary | ICD-10-CM | POA: Diagnosis not present

## 2011-03-01 MED ORDER — FILGRASTIM 300 MCG/0.5ML IJ SOLN
300.0000 ug | Freq: Once | INTRAMUSCULAR | Status: AC
  Administered 2011-03-01: 300 ug via SUBCUTANEOUS

## 2011-03-01 NOTE — Progress Notes (Signed)
OFFICE PROGRESS NOTE Date of Visit 02-28-2011 Physicians:  P.Gehrig, K.Little, R.Taavon, J.Hayes, G.Rankin  INTERVAL HISTORY:   Patient is seen, together with husband, in continuing attention to her gyn carcinoma, having had second cycle of taxol/carboplatin on Jan 9 with neupogen x 3 doses Jan 12-22-12. She had severe aches in lower legs beginning with second neupogen injection, improved with alternating ibuprofen and tylenol. The aches have resolved and she does not notice any peripheral neuropathy symptoms.   Present illness began with several weeks of diarrhea and abdominal bloating fall 2012,with patient found to have ascites and peritoneal carcinomatosis by [place] and CT in Nov, then cytology of ascites showing malignancy consistent with ovarian or primary peritoneal carcinoma. She was seen by Dr.Gehrig, then referred here for neoadjuvant chemotherapy due to extent of involvement; she may be candidate for interval debulking after ~ 3 cycles. She had initial  paracentesis of 2.4 liters on Dec 2 and another 3/2 liters removed on 01-22-11, but has had no progressive ascites since chemotherapy was begun.  She has felt very well for past several days, able to do housework with good energy, improved abdominal distension, bowels moving regularly, no bladder symptoms, no fever or symptoms of infection, sleeping well, no shortness of breath. Vision is some better in left eye since first intraocular injection by Dr.Rankin on Jan 10; next scheduled Feb 13.  Review of systems otherwise: no bleeding, no swelling LE, no GERD. Nasal septum improved, still minimally sore on left and she continues topical antibiotic by Dr.Little.Remainder of 10 point ROS negative.  Objective:  Vital signs in last 24 hours:  BP 152/84  Pulse 76  Temp(Src) 97.7 F (36.5 C) (Oral)  Ht 5\' 2"  (1.575 m)  Wt 169 lb 4.8 oz (76.794 kg)  BMI 30.97 kg/m2  This weight is down almost 2 lbs. Easily mobile, talkative, looks much brighter  and more comfortable than earlier visits.  HEENT:mucous membranes moist, pharynx normal without lesions. Alopecia. PERRL, not icteric. No light sensitivity now.No swelling or erythema apparent at nasal septum nowl LymphaticsCervical, supraclavicular, and axillary nodes normal.No inguinal adenopathy Resp: clear to auscultation bilaterally and normal percussion bilaterally Cardio: regular rate and rhythm GI: soft, still seems a little distended, nontender, positive bowel sounds, no appreciable organomgaly or masses. Extremities: extremities normal, atraumatic, no cyanosis or edema Neuro:no sensory deficits noted  No central catheter.  Note peripheral IV access has been somewhat difficult, last IV start by Chrystie Nose.  Lab Results:   Basename 02/28/11 0840  WBC 2.5*  HGB 10.2*  HCT 30.8*  PLT 212  ANC 0.5  BMET  Basename 02/28/11 0840  NA 137  K 5.1  CL 103  CO2 24  GLUCOSE 110*  BUN 17  CREATININE 0.96  CALCIUM 10.0   Remainder of full CMET normal  CA 125 resulted after visit as 12.6, this having been 133 on 12-28 and 204 on 12-12. This result was given to patient by phone after visit.  Studies/Results:  No results found.  Medications: I have reviewed the patient's current medications. As she is neutropenic, she has been given another neupogen injection 300 micrograms by my RN today and will have an additional injection tomorrow. RN has reviewed neutropenic precautions. We will recheck cbc 1-25 to be sure counts are adequate for chemotherapy 1-28. She will be scheduled for neupogen x 4 doses beginning 1-29; patient understands rationale for the gCSF and agrees.   Assessment/Plan:  1.Gyn carcinoma: marked clinical improvement with 2 cycles of taxol/carboplatin. Will  give cycle 3 on 03-10-11 as long as counts are adequate, then repeat scans prior to seeing Dr.Gehrig again possibly Feb 6 or Feb 12. I will see her in follow up of cycle 3 chemo at least Feb 8 with labs, or  sooner if needed. 2.Neutropenia: now day 10 cycle 2 and post 3 doses neupogen already as noted. Hopefully counts will improve quickly with the additional neupogen.  3. Hyponatremia resolved 4.Oral candida resolved 5. Difficult IV access.         Rissa Turley P, MD   03/01/2011, 2:35 PM

## 2011-03-04 DIAGNOSIS — L570 Actinic keratosis: Secondary | ICD-10-CM | POA: Diagnosis not present

## 2011-03-04 DIAGNOSIS — L259 Unspecified contact dermatitis, unspecified cause: Secondary | ICD-10-CM | POA: Diagnosis not present

## 2011-03-05 ENCOUNTER — Ambulatory Visit (HOSPITAL_COMMUNITY)
Admission: RE | Admit: 2011-03-05 | Discharge: 2011-03-05 | Disposition: A | Discharge status: Home / Self Care | Payer: Medicare Other | Source: Ambulatory Visit | Attending: Oncology | Admitting: Oncology

## 2011-03-05 DIAGNOSIS — C569 Malignant neoplasm of unspecified ovary: Secondary | ICD-10-CM | POA: Insufficient documentation

## 2011-03-05 DIAGNOSIS — K449 Diaphragmatic hernia without obstruction or gangrene: Secondary | ICD-10-CM | POA: Insufficient documentation

## 2011-03-05 DIAGNOSIS — K5730 Diverticulosis of large intestine without perforation or abscess without bleeding: Secondary | ICD-10-CM | POA: Diagnosis not present

## 2011-03-05 MED ORDER — IOHEXOL 300 MG/ML  SOLN
100.0000 mL | Freq: Once | INTRAMUSCULAR | Status: AC | PRN
  Administered 2011-03-05: 100 mL via INTRAVENOUS

## 2011-03-07 ENCOUNTER — Other Ambulatory Visit (HOSPITAL_BASED_OUTPATIENT_CLINIC_OR_DEPARTMENT_OTHER): Payer: Medicare Other | Admitting: Lab

## 2011-03-07 DIAGNOSIS — C569 Malignant neoplasm of unspecified ovary: Secondary | ICD-10-CM | POA: Diagnosis not present

## 2011-03-07 LAB — CBC WITH DIFFERENTIAL/PLATELET
BASO%: 0.6 % (ref 0.0–2.0)
Basophils Absolute: 0 10*3/uL (ref 0.0–0.1)
EOS%: 0.7 % (ref 0.0–7.0)
Eosinophils Absolute: 0 10*3/uL (ref 0.0–0.5)
HCT: 33 % — ABNORMAL LOW (ref 34.8–46.6)
HGB: 11.4 g/dL — ABNORMAL LOW (ref 11.6–15.9)
LYMPH%: 27.2 % (ref 14.0–49.7)
MCH: 28.8 pg (ref 25.1–34.0)
MCHC: 34.4 g/dL (ref 31.5–36.0)
MCV: 83.7 fL (ref 79.5–101.0)
MONO#: 0.8 10*3/uL (ref 0.1–0.9)
MONO%: 19.5 % — ABNORMAL HIGH (ref 0.0–14.0)
NEUT#: 2.1 10*3/uL (ref 1.5–6.5)
NEUT%: 52 % (ref 38.4–76.8)
Platelets: 132 10*3/uL — ABNORMAL LOW (ref 145–400)
RBC: 3.95 10*6/uL (ref 3.70–5.45)
RDW: 23.8 % — ABNORMAL HIGH (ref 11.2–14.5)
WBC: 4.1 10*3/uL (ref 3.9–10.3)
lymph#: 1.1 10*3/uL (ref 0.9–3.3)

## 2011-03-10 ENCOUNTER — Telehealth: Payer: Self-pay

## 2011-03-10 ENCOUNTER — Ambulatory Visit (HOSPITAL_BASED_OUTPATIENT_CLINIC_OR_DEPARTMENT_OTHER): Payer: Medicare Other

## 2011-03-10 VITALS — BP 150/88 | HR 93 | Temp 98.7°F

## 2011-03-10 DIAGNOSIS — C569 Malignant neoplasm of unspecified ovary: Secondary | ICD-10-CM

## 2011-03-10 MED ORDER — CARBOPLATIN CHEMO INJECTION 600 MG/60ML
410.0000 mg | Freq: Once | INTRAVENOUS | Status: AC
  Administered 2011-03-10: 410 mg via INTRAVENOUS
  Filled 2011-03-10: qty 41

## 2011-03-10 MED ORDER — ONDANSETRON 16 MG/50ML IVPB (CHCC)
16.0000 mg | Freq: Once | INTRAVENOUS | Status: AC
  Administered 2011-03-10: 16 mg via INTRAVENOUS

## 2011-03-10 MED ORDER — SODIUM CHLORIDE 0.9 % IV SOLN: Freq: Once | INTRAVENOUS | Status: DC

## 2011-03-10 MED ORDER — FAMOTIDINE IN NACL 20-0.9 MG/50ML-% IV SOLN
20.0000 mg | Freq: Once | INTRAVENOUS | Status: AC
  Administered 2011-03-10: 20 mg via INTRAVENOUS

## 2011-03-10 MED ORDER — PACLITAXEL CHEMO INJECTION 300 MG/50ML
135.0000 mg/m2 | Freq: Once | INTRAVENOUS | Status: AC
  Administered 2011-03-10: 264 mg via INTRAVENOUS
  Filled 2011-03-10: qty 44

## 2011-03-10 MED ORDER — DIPHENHYDRAMINE HCL 50 MG/ML IJ SOLN
50.0000 mg | Freq: Once | INTRAMUSCULAR | Status: AC
  Administered 2011-03-10: 50 mg via INTRAVENOUS

## 2011-03-10 MED ORDER — DEXAMETHASONE SODIUM PHOSPHATE 4 MG/ML IJ SOLN
20.0000 mg | Freq: Once | INTRAMUSCULAR | Status: AC
  Administered 2011-03-10: 20 mg via INTRAVENOUS

## 2011-03-10 NOTE — Telephone Encounter (Signed)
SPOKE WITH DTR. [name]  AND TOLD HER THAT HER  WBC COUT 03-07-11 WAS FINE FOR TREATMENT TODAY.  SHE HAS SCHEDULE FOR INJECTIONS FOR NEUPOGEN BEGINNING TOMORROW 03-11-11.

## 2011-03-11 ENCOUNTER — Ambulatory Visit (HOSPITAL_BASED_OUTPATIENT_CLINIC_OR_DEPARTMENT_OTHER): Payer: Medicare Other

## 2011-03-11 VITALS — BP 170/85 | HR 75 | Temp 99.2°F

## 2011-03-11 DIAGNOSIS — H01009 Unspecified blepharitis unspecified eye, unspecified eyelid: Secondary | ICD-10-CM | POA: Diagnosis not present

## 2011-03-11 DIAGNOSIS — Z5112 Encounter for antineoplastic immunotherapy: Secondary | ICD-10-CM | POA: Diagnosis not present

## 2011-03-11 DIAGNOSIS — C569 Malignant neoplasm of unspecified ovary: Secondary | ICD-10-CM

## 2011-03-11 MED ORDER — FILGRASTIM 300 MCG/0.5ML IJ SOLN
300.0000 ug | Freq: Once | INTRAMUSCULAR | Status: AC
  Administered 2011-03-11: 300 ug via SUBCUTANEOUS
  Filled 2011-03-11: qty 0.5

## 2011-03-12 ENCOUNTER — Ambulatory Visit (HOSPITAL_BASED_OUTPATIENT_CLINIC_OR_DEPARTMENT_OTHER): Payer: Medicare Other

## 2011-03-12 VITALS — BP 129/72 | HR 74 | Temp 99.2°F

## 2011-03-12 DIAGNOSIS — C569 Malignant neoplasm of unspecified ovary: Secondary | ICD-10-CM

## 2011-03-12 DIAGNOSIS — C786 Secondary malignant neoplasm of retroperitoneum and peritoneum: Secondary | ICD-10-CM | POA: Diagnosis not present

## 2011-03-12 DIAGNOSIS — C801 Malignant (primary) neoplasm, unspecified: Secondary | ICD-10-CM

## 2011-03-12 MED ORDER — FILGRASTIM 300 MCG/0.5ML IJ SOLN
300.0000 ug | Freq: Once | INTRAMUSCULAR | Status: AC
  Administered 2011-03-12: 300 ug via SUBCUTANEOUS
  Filled 2011-03-12: qty 0.5

## 2011-03-13 ENCOUNTER — Ambulatory Visit (HOSPITAL_BASED_OUTPATIENT_CLINIC_OR_DEPARTMENT_OTHER): Payer: Medicare Other

## 2011-03-13 VITALS — BP 151/77 | HR 74 | Temp 99.0°F

## 2011-03-13 DIAGNOSIS — C786 Secondary malignant neoplasm of retroperitoneum and peritoneum: Secondary | ICD-10-CM | POA: Diagnosis not present

## 2011-03-13 DIAGNOSIS — D709 Neutropenia, unspecified: Secondary | ICD-10-CM | POA: Diagnosis not present

## 2011-03-13 DIAGNOSIS — C569 Malignant neoplasm of unspecified ovary: Secondary | ICD-10-CM

## 2011-03-13 DIAGNOSIS — C801 Malignant (primary) neoplasm, unspecified: Secondary | ICD-10-CM | POA: Diagnosis not present

## 2011-03-13 MED ORDER — FILGRASTIM 300 MCG/0.5ML IJ SOLN
300.0000 ug | Freq: Once | INTRAMUSCULAR | Status: AC
  Administered 2011-03-13: 300 ug via SUBCUTANEOUS
  Filled 2011-03-13: qty 0.5

## 2011-03-14 ENCOUNTER — Ambulatory Visit (HOSPITAL_BASED_OUTPATIENT_CLINIC_OR_DEPARTMENT_OTHER): Payer: Medicare Other

## 2011-03-14 VITALS — BP 165/84 | HR 78 | Temp 98.2°F

## 2011-03-14 DIAGNOSIS — Z5112 Encounter for antineoplastic immunotherapy: Secondary | ICD-10-CM

## 2011-03-14 DIAGNOSIS — C569 Malignant neoplasm of unspecified ovary: Secondary | ICD-10-CM | POA: Diagnosis not present

## 2011-03-14 MED ORDER — FILGRASTIM 300 MCG/0.5ML IJ SOLN
300.0000 ug | Freq: Once | INTRAMUSCULAR | Status: AC
  Administered 2011-03-14: 300 ug via SUBCUTANEOUS
  Filled 2011-03-14: qty 0.5

## 2011-03-17 DIAGNOSIS — J309 Allergic rhinitis, unspecified: Secondary | ICD-10-CM | POA: Diagnosis not present

## 2011-03-17 DIAGNOSIS — I10 Essential (primary) hypertension: Secondary | ICD-10-CM | POA: Diagnosis not present

## 2011-03-19 ENCOUNTER — Ambulatory Visit: Payer: Medicare Other | Attending: Gynecologic Oncology | Admitting: Gynecologic Oncology

## 2011-03-19 ENCOUNTER — Encounter: Payer: Self-pay | Admitting: Gynecologic Oncology

## 2011-03-19 VITALS — BP 140/90 | HR 80 | Temp 97.9°F | Resp 20 | Ht 63.94 in | Wt 171.8 lb

## 2011-03-19 DIAGNOSIS — Z87891 Personal history of nicotine dependence: Secondary | ICD-10-CM | POA: Diagnosis not present

## 2011-03-19 DIAGNOSIS — C801 Malignant (primary) neoplasm, unspecified: Secondary | ICD-10-CM | POA: Insufficient documentation

## 2011-03-19 DIAGNOSIS — Z79899 Other long term (current) drug therapy: Secondary | ICD-10-CM | POA: Insufficient documentation

## 2011-03-19 DIAGNOSIS — C786 Secondary malignant neoplasm of retroperitoneum and peritoneum: Secondary | ICD-10-CM | POA: Insufficient documentation

## 2011-03-19 DIAGNOSIS — Z96659 Presence of unspecified artificial knee joint: Secondary | ICD-10-CM | POA: Diagnosis not present

## 2011-03-19 DIAGNOSIS — C569 Malignant neoplasm of unspecified ovary: Secondary | ICD-10-CM

## 2011-03-19 NOTE — Progress Notes (Signed)
Consult Note: Gyn-Onc  [name] 78 y.o. female  CC:  Chief Complaint  Patient presents with  . 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 Her illness began with several weeks of diarrhea and abdominal bloating the fall of 2012. She was tested several times for C. difficile was negative. Dr. Billy Coast at that time recommended an ultrasound and ultimately a CT scan. Ultrasound showed significant ascites and her CA 125 was markedly elevated which is when we saw her. CT imaging was most consistent with markedly advanced disease that would most likely preclude optimal debulking. This in conjunction with her poor performance status the decision was made to proceed with neoadjuvant chemotherapy. She had a high-volume paracentesis to confirm the diagnosis of a gynecologic malignancy.  She has undergone 3 cycles of neoadjuvant chemotherapy under the care of Dr. Darrold Span with paclitaxel and carboplatin. Her CA 125 prior cycle #1 was 204 and most recently on January 18 prior to cycle #2 her CA 125 was 12.6. She also had a CT scan last week that showed marked resolution of her ascites and marked improvement in the omental caking. There is no mention of any significant carcinomatosis. There is no significant adenopathy. The adnexa and uterus are within normal limits.  Interval History:   Review of Systems: She states that she is "feeling exceptionally well". Her bloating is completely resolved and her diarrhea is gone. She states she feels normal. Her appetite is much better. The breathlessness that she had before is completely resolved. She is able to go shopping and do her usual activities. She currently complains that the biggest issue is that she is receiving Neupogen and that is as bad as "chemotherapy".  Current Meds:  Outpatient Encounter Prescriptions as of 03/19/2011  Medication Sig Dispense Refill  .  acetaminophen (TYLENOL) 325 MG tablet Take 650 mg by mouth as needed.        . Cholecalciferol (VITAMIN D) 2000 UNITS tablet Take 2,000 Units by mouth daily.        Marland Kitchen dexamethasone (DECADRON) 4 MG tablet TAKE 5 TABS= 20 MG 12 HRS. AND 6 HRS. PRIOR TO CHEMOTHERAPY.  EACH DOSE WITH FOOD.  10 tablet  1  . lisinopril (PRINIVIL,ZESTRIL) 10 MG tablet Take 5 mg by mouth daily.        Marland Kitchen LORazepam (ATIVAN) 0.5 MG tablet TAKE 1 TABLET ORALLY OR UNDER THE TONGUE NIGHT OF CHEMOTHERAPY WHETHER  OR NOT ANY NAUSEA AND EVERY 4-6 HR. AS NEEDED FOR NAUSEA.  20 tablet  0  . magnesium oxide (MAG-OX) 400 MG tablet Take 1 tablet (400 mg total) by mouth daily.  30 tablet  0  . Multiple Vitamins-Minerals (CENTRUM SILVER PO) Take 1 tablet by mouth daily.        . nadolol (CORGARD) 20 MG tablet Take 10 mg by mouth daily.       Marland Kitchen nystatin cream (MYCOSTATIN) Apply 1 application topically 3 (three) times daily as needed.  30 g  1  . omega-3 acid ethyl esters (LOVAZA) 1 G capsule Take 1 g by mouth daily.      . ondansetron (ZOFRAN) 8 MG tablet TAKE 1-2 TABLETS EVERY 12-24 HRS. AS NEEDED FOR NAUSEA.  TAKE 1 TABLET IN THE MORNING AFTER CHEMOTHERAPY WHETHER OR NOT NAUSEA  20 tablet  1  . pantoprazole (PROTONIX) 40 MG tablet Take 40 mg by mouth daily.        Marland Kitchen  zolpidem (AMBIEN) 10 MG tablet Take 5-10 mg by mouth at bedtime as needed.        . fluticasone (FLONASE) 50 MCG/ACT nasal spray Place 2 sprays into the nose as needed.         Facility-Administered Encounter Medications as of 03/19/2011  Medication Dose Route Frequency Provider Last Rate Last Dose  . CARBOplatin (PARAPLATIN) 430 mg in sodium chloride 0.9 % 250 mL chemo infusion  430 mg Intravenous Once Lennis P Livesay, MD      . PACLitaxel (TAXOL) 264 mg in dextrose 5 % 250 mL chemo infusion (> 80mg /m2)  135 mg/m2 (Treatment Plan Actual) Intravenous Once Lennis Buzzy Han, MD        Allergy:  Allergies  Allergen Reactions  . Iodine     REACTION: hives  . Morphine And  Related     Given after knee replacement and code blue occurred  . Penicillins     REACTION: hives  . Shellfish Allergy     Pt has shellfish allergy only.  Has had IV contrast x 2 and did fine.    Social Hx:   History   Social History  . Marital Status: Married    Spouse Name: N/A    Number of Children: N/A  . Years of Education: N/A   Occupational History  . Not on file.   Social History Main Topics  . Smoking status: Former Smoker    Quit date: 02/11/1952  . Smokeless tobacco: Not on file  . Alcohol Use: No  . Drug Use: No  . Sexually Active: No   Other Topics Concern  . Not on file   Social History Narrative  . No narrative on file    Past Surgical Hx:  Past Surgical History  Procedure Date  . Tonsillectomy   . Appendectomy   . Joint replacement     R knee in 2008, 5 operations on L knee    Past Medical Hx:  Past Medical History  Diagnosis Date  . Ascites   . Cystitis   . GERD (gastroesophageal reflux disease)   . Hypertension   . Hyponatremia   . Hyperlipidemia   . Osteopenia   . Postmenopausal atrophic vaginitis   . Vulvitis   . Staph infection 2010    after knee replacement  . Urinary frequency   . Ovarian cancer 01/25/2011    Family Hx:  Family History  Problem Relation Age of Onset  . Heart attack Brother   . Diabetes Brother   . Cancer Other     Bladder cancer    Vitals:  Blood pressure 140/90, pulse 80, temperature 97.9 F (36.6 C), temperature source Oral, resp. rate 20, height 5' 3.94" (1.624 m), weight 171 lb 12.8 oz (77.928 kg).  Physical Exam: Well-nourished well-developed female in no acute distress.  Neck: No lymphadenopathy no thyromegaly.  Lungs: Clear to auscultation.  Cardiovascular: Regular rate and rhythm.  Abdomen: Soft nontender, nondistended, no palpable masses, no hepatosplenomegaly, no ascites.  Groins: No lymphadenopathy.  Extremities: No edema.  Pelvic: External genitalia within normal limits.  Bimanual examination the cervix is palpably normal the purposes of normal size shape and consistency there are no adnexal masses. Rectal confirms there is no nodularity.  Assessment/Plan: 78 year old with stage IIIc ovarian versus primary peritoneal carcinoma who has had a dramatic response to 3 cycles of paclitaxel and carboplatin.  I believe she would be an excellent candidate for interval debulking at this point. She is tentatively scheduled for  surgery for February 19 with Dr. Loree Fee. She was somewhat upset to hear that I would not be doing her surgery but I discussed with her that a previously scheduled date that I was to be here has been removed as I have been called to jury duty.   After discussing the surgery that would be involved including exploratory laparotomy TAH/BSO omentectomy and that this is something that Dr. Loree Fee is well versed in doing she is considering having her surgery here at [facility] with Dr. Loree Fee. Alternatively she was offered the opportunity to have the surgery at [facility]. We discussed at length the convalescence from the surgery in-house course, the surgical debulking, risks of surgery, and need for additional chemotherapy. 40 minutes face to face time was spent with the patient and her family answering her numerous questions which were all quite appropriate.  She is very concerned about making sure that he is aware of her morphine allergy, the staph infection she previously has had, and her risks of hyponatremia. I discussed with her as did Harriett Sine these are things that are well documented in her medical record and we're aware. She would like to be the first case and we can make that happen for her. At this point she'll consider whether or not she will surgery he [facility] on February 19. She was offered the opportunity to meet briefly meet Dr. Loree Fee he is here for clinic on February 11. He to the patient and her daughter Raynelle Fanning will call us to  determine her final dispositioned. Cerina Leary A., MD 03/19/2011, 4:57 PM

## 2011-03-19 NOTE — Patient Instructions (Signed)
Return to clinic as scheduled

## 2011-03-21 ENCOUNTER — Ambulatory Visit: Payer: Medicare Other

## 2011-03-21 ENCOUNTER — Ambulatory Visit: Payer: Medicare Other | Admitting: Oncology

## 2011-03-21 ENCOUNTER — Ambulatory Visit (HOSPITAL_BASED_OUTPATIENT_CLINIC_OR_DEPARTMENT_OTHER): Payer: Medicare Other | Admitting: Lab

## 2011-03-21 VITALS — BP 119/66 | HR 83 | Temp 98.9°F | Ht 63.5 in | Wt 169.9 lb

## 2011-03-21 DIAGNOSIS — C569 Malignant neoplasm of unspecified ovary: Secondary | ICD-10-CM

## 2011-03-21 LAB — CBC WITH DIFFERENTIAL/PLATELET
BASO%: 0.4 % (ref 0.0–2.0)
Basophils Absolute: 0 10e3/uL (ref 0.0–0.1)
EOS%: 1.1 % (ref 0.0–7.0)
Eosinophils Absolute: 0.1 10e3/uL (ref 0.0–0.5)
HCT: 31.2 % — ABNORMAL LOW (ref 34.8–46.6)
HGB: 10.5 g/dL — ABNORMAL LOW (ref 11.6–15.9)
LYMPH%: 31.1 % (ref 14.0–49.7)
MCH: 28.6 pg (ref 25.1–34.0)
MCHC: 33.7 g/dL (ref 31.5–36.0)
MCV: 85 fL (ref 79.5–101.0)
MONO#: 1 10e3/uL — ABNORMAL HIGH (ref 0.1–0.9)
MONO%: 21.3 % — ABNORMAL HIGH (ref 0.0–14.0)
NEUT#: 2.1 10e3/uL (ref 1.5–6.5)
NEUT%: 46.1 % (ref 38.4–76.8)
Platelets: 186 10e3/uL (ref 145–400)
RBC: 3.67 10e6/uL — ABNORMAL LOW (ref 3.70–5.45)
RDW: 23.2 % — ABNORMAL HIGH (ref 11.2–14.5)
WBC: 4.6 10e3/uL (ref 3.9–10.3)
lymph#: 1.4 10e3/uL (ref 0.9–3.3)
nRBC: 0 % (ref 0–0)

## 2011-03-21 LAB — COMPREHENSIVE METABOLIC PANEL
ALT: 14 U/L (ref 0–35)
AST: 23 U/L (ref 0–37)
Albumin: 4.1 g/dL (ref 3.5–5.2)
Alkaline Phosphatase: 64 U/L (ref 39–117)
BUN: 23 mg/dL (ref 6–23)
CO2: 22 mEq/L (ref 19–32)
Calcium: 9.5 mg/dL (ref 8.4–10.5)
Chloride: 102 mEq/L (ref 96–112)
Creatinine, Ser: 0.97 mg/dL (ref 0.50–1.10)
Glucose, Bld: 94 mg/dL (ref 70–99)
Potassium: 4.6 mEq/L (ref 3.5–5.3)
Sodium: 135 mEq/L (ref 135–145)
Total Bilirubin: 0.4 mg/dL (ref 0.3–1.2)
Total Protein: 6.8 g/dL (ref 6.0–8.3)

## 2011-03-21 LAB — CA 125: CA 125: 4.9 U/mL (ref 0.0–30.2)

## 2011-03-22 ENCOUNTER — Encounter: Payer: Self-pay | Admitting: Oncology

## 2011-03-22 NOTE — Progress Notes (Signed)
OFFICE PROGRESS NOTE Date of Visit 03-21-11 Physicians: P.Gehrig, K.Little, R.Taavon,J.Hayes, G.Rankin  INTERVAL HISTORY:   Patient is seen, alone for visit today, in continuing attention to neoadjuvant chemotherapy used to this point for her primary gyn carcinoma. She had thrid cycle of taxol/carboplatin on 03-10-11 with neupogen x 4 days 1-29 thru 03-14-11. She had restaging CT AP 03-05-11 which showed resolution of ascites and significant improvement in omental disease. She saw Dr.Gehrig 03-19-11, her exam really very unremarkable. Recommendation is to proceed with surgery, which patient is comfortable having done by [facility] in [place] on Feb 19.  Patient tells me that she cannot believe how well she feels,   "I just feel normal". She has no aches now, some very minimal and intermittent tingling in fingertips, good energy, good appetite, bowels moving well, no shortness of breath, no bladder symptoms, no bleeding, no fever or symptoms of infection. Remainder of 10 point Review of Systems is negative/unchanged.  Patient's granddaughter is to be married this summer.  Objective:  Vital signs in last 24 hours:  BP 119/66  Pulse 83  Temp(Src) 98.9 F (37.2 C) (Oral)  Ht 5' 3.5" (1.613 m)  Wt 169 lb 14.4 oz (77.066 kg)  BMI 29.62 kg/m2  Easily ambulatory without assistance, talkative, looks comfortable.  HEENT:mucous membranes moist, pharynx normal without lesions. Alopecia. PERRL LymphaticsCervical, supraclavicular, and axillary nodes normal.No inguinal adenopathy Resp: clear to auscultation bilaterally and normal percussion bilaterally Cardio: regular rate and rhythm GI: soft, nontender, not clearly distended, feels somewhat full LLQ, bowel sounds present, not tender epigastrium.No appreciable HSM/ masses Extremities: extremities normal, atraumatic, no cyanosis or edema Neuro:no sensory deficits noted No central catheter  Lab Results:   Basename 03/21/11 1353  WBC 4.6  HGB  10.5*  HCT 31.2*  PLT 186   ANC 2.1 BMET  Basename 03/21/11 1353  NA 135  K 4.6  CL 102  CO2 22  GLUCOSE 94  BUN 23  CREATININE 0.97  CALCIUM 9.5   CA 125 available after visit down to 4.9, this having been 239 in Dec.  Studies/Results:  CT 03-05-11 as above.  Medications: I have reviewed the patient's current medications.  Assessment/Plan:  1. Gyn carcinoma, ovarian vs primary peritoneal: excellent response to 3 cycles of neoadjuvant taxol/ carboplatin thru 03-10-11, now for surgery by gyn oncology planned 04-01-11. I have tentatively scheduled her back to me on March 8 with CBC and CMET, anticipating completion of chemotherapy after surgery. 2.Neutropenia with chemotherapy such that gCSF added 3.Hyponatremia resolved 4. Chemotherapy anemia: follow       LIVESAY,LENNIS P, MD   03/22/2011, 6:09 PM

## 2011-03-25 ENCOUNTER — Encounter (HOSPITAL_COMMUNITY): Payer: Self-pay | Admitting: Pharmacy Technician

## 2011-03-25 DIAGNOSIS — H18519 Endothelial corneal dystrophy, unspecified eye: Secondary | ICD-10-CM | POA: Diagnosis not present

## 2011-03-25 DIAGNOSIS — H01009 Unspecified blepharitis unspecified eye, unspecified eyelid: Secondary | ICD-10-CM | POA: Diagnosis not present

## 2011-03-25 DIAGNOSIS — H11159 Pinguecula, unspecified eye: Secondary | ICD-10-CM | POA: Diagnosis not present

## 2011-03-25 DIAGNOSIS — H04129 Dry eye syndrome of unspecified lacrimal gland: Secondary | ICD-10-CM | POA: Diagnosis not present

## 2011-03-26 ENCOUNTER — Telehealth: Payer: Self-pay

## 2011-03-26 DIAGNOSIS — H353290 Exudative age-related macular degeneration, unspecified eye, stage unspecified: Secondary | ICD-10-CM | POA: Diagnosis not present

## 2011-03-26 DIAGNOSIS — H35059 Retinal neovascularization, unspecified, unspecified eye: Secondary | ICD-10-CM | POA: Diagnosis not present

## 2011-03-26 NOTE — Telephone Encounter (Signed)
SPOKE WITH [name] ABOUT HER LAB WORK DONE ON 03-21-11.  PT. ESPECIALLY PLEASED THAT HER CA-125 WAS EVEN LOWER.

## 2011-03-28 ENCOUNTER — Encounter (HOSPITAL_COMMUNITY)
Admission: RE | Admit: 2011-03-28 | Discharge: 2011-03-28 | Disposition: A | Discharge status: Home / Self Care | Payer: Medicare Other | Source: Ambulatory Visit | Attending: Obstetrics & Gynecology | Admitting: Obstetrics & Gynecology

## 2011-03-28 ENCOUNTER — Other Ambulatory Visit: Payer: Self-pay

## 2011-03-28 ENCOUNTER — Encounter (HOSPITAL_COMMUNITY): Payer: Self-pay

## 2011-03-28 DIAGNOSIS — D62 Acute posthemorrhagic anemia: Secondary | ICD-10-CM | POA: Diagnosis not present

## 2011-03-28 DIAGNOSIS — I10 Essential (primary) hypertension: Secondary | ICD-10-CM | POA: Diagnosis not present

## 2011-03-28 DIAGNOSIS — C569 Malignant neoplasm of unspecified ovary: Secondary | ICD-10-CM | POA: Diagnosis not present

## 2011-03-28 DIAGNOSIS — E871 Hypo-osmolality and hyponatremia: Secondary | ICD-10-CM | POA: Diagnosis not present

## 2011-03-28 HISTORY — DX: Pneumonia, unspecified organism: J18.9

## 2011-03-28 HISTORY — DX: Other complications of anesthesia, initial encounter: T88.59XA

## 2011-03-28 HISTORY — DX: Encounter for other specified aftercare: Z51.89

## 2011-03-28 HISTORY — DX: Adverse effect of unspecified anesthetic, initial encounter: T41.45XA

## 2011-03-28 HISTORY — DX: Unspecified osteoarthritis, unspecified site: M19.90

## 2011-03-28 HISTORY — DX: Reserved for inherently not codable concepts without codable children: IMO0001

## 2011-03-28 HISTORY — DX: Personal history of other diseases of the digestive system: Z87.19

## 2011-03-28 HISTORY — DX: Anemia, unspecified: D64.9

## 2011-03-28 LAB — DIFFERENTIAL
Basophils Absolute: 0 10*3/uL (ref 0.0–0.1)
Basophils Relative: 1 % (ref 0–1)
Eosinophils Absolute: 0 10*3/uL (ref 0.0–0.7)
Eosinophils Relative: 1 % (ref 0–5)
Lymphocytes Relative: 27 % (ref 12–46)
Lymphs Abs: 1.1 10*3/uL (ref 0.7–4.0)
Monocytes Absolute: 0.9 10*3/uL (ref 0.1–1.0)
Monocytes Relative: 22 % — ABNORMAL HIGH (ref 3–12)
Neutro Abs: 2.1 10*3/uL (ref 1.7–7.7)
Neutrophils Relative %: 49 % (ref 43–77)

## 2011-03-28 LAB — CBC
HCT: 31.2 % — ABNORMAL LOW (ref 36.0–46.0)
Hemoglobin: 10.4 g/dL — ABNORMAL LOW (ref 12.0–15.0)
MCH: 28.8 pg (ref 26.0–34.0)
MCHC: 33.3 g/dL (ref 30.0–36.0)
MCV: 86.4 fL (ref 78.0–100.0)
Platelets: 103 10*3/uL — ABNORMAL LOW (ref 150–400)
RBC: 3.61 MIL/uL — ABNORMAL LOW (ref 3.87–5.11)
RDW: 22.3 % — ABNORMAL HIGH (ref 11.5–15.5)
WBC: 4.2 10*3/uL (ref 4.0–10.5)

## 2011-03-28 LAB — COMPREHENSIVE METABOLIC PANEL
ALT: 12 U/L (ref 0–35)
AST: 21 U/L (ref 0–37)
Albumin: 3.6 g/dL (ref 3.5–5.2)
Alkaline Phosphatase: 61 U/L (ref 39–117)
BUN: 23 mg/dL (ref 6–23)
CO2: 27 mEq/L (ref 19–32)
Calcium: 11 mg/dL — ABNORMAL HIGH (ref 8.4–10.5)
Chloride: 101 mEq/L (ref 96–112)
Creatinine, Ser: 0.88 mg/dL (ref 0.50–1.10)
GFR calc Af Amer: 71 mL/min — ABNORMAL LOW (ref >90)
GFR calc non Af Amer: 61 mL/min — ABNORMAL LOW (ref >90)
Glucose, Bld: 102 mg/dL — ABNORMAL HIGH (ref 70–99)
Potassium: 4.9 mEq/L (ref 3.5–5.1)
Sodium: 137 mEq/L (ref 135–145)
Total Bilirubin: 0.4 mg/dL (ref 0.3–1.2)
Total Protein: 7.4 g/dL (ref 6.0–8.3)

## 2011-03-28 LAB — SURGICAL PCR SCREEN
MRSA, PCR: NEGATIVE
Staphylococcus aureus: NEGATIVE

## 2011-03-28 NOTE — Pre-Procedure Instructions (Signed)
03/28/11 Telford Nab RN made aware of hgb of 10.4 and platelect count 103.  No new orders given.  Also informed Telford Nab RN that Miralax dose for bowel prep is 255g.

## 2011-03-28 NOTE — Patient Instructions (Signed)
20 [name]  03/28/2011   Your procedure is scheduled on:  04/01/11 [id number]  Report to [facility] at 0515 AM.  Call this number if you have problems the morning of surgery: [phone]   Remember:   Do not eat food:After Midnight.  May have clear liquids:until Midnight .  Marland Kitchen  Take these medicines the morning of surgery with A SIP OF WATER:    Do not wear jewelry, make-up or nail polish.  Do not wear lotions, powders, or perfumes.   Do not shave 48 hours prior to surgery.  Do not bring valuables to the hospital.  Contacts, dentures or bridgework may not be worn into surgery.  Leave suitcase in the car. After surgery it may be brought to your room.  For patients admitted to the hospital, checkout time is 11:00 AM the day of discharge.       Special Instructions: CHG Shower Use Special Wash: 1/2 bottle night before surgery and 1/2 bottle morning of surgery. shower chin to toes with CHG.  Wash face and private parts with regular soap.    Please read over the following fact sheets that you were given: MRSA Information, Incentive Spirometry Fact Sheet, Blood Transfusion Fact Sheet, coughing and deep breathing exercises, leg exercises

## 2011-03-28 NOTE — Pre-Procedure Instructions (Signed)
03/28/11 On preop appt pt was unsure of how much Miralax to purchase for bowel prep.  Not listed on bowel prep sheet.  Pt asked me to call Telford Nab RN and ask Harriett Sine amount of Miralax  For prep.  I called Telford Nab RN and she instructed me to ask pharmacy.  Nurse asked pharmacy tech how much MIralax in bowel prep.  Instructed as 255 g .  Written down for pt and placed on front of preop folder.  Pt saw and voiced understanding. Nurse instructed pt the container was about a pint size.

## 2011-03-28 NOTE — Pre-Procedure Instructions (Signed)
03/28/11 Pt prefers kerlix gauze instead of bandaids or tape.  Pt reports poor veins.

## 2011-03-28 NOTE — Pre-Procedure Instructions (Signed)
03/28/11 Pt reports completion of chemotherapy approx 3 weeks ago.

## 2011-03-31 ENCOUNTER — Telehealth: Payer: Self-pay | Admitting: Gynecologic Oncology

## 2011-03-31 NOTE — Pre-Procedure Instructions (Signed)
03/31/11 Patient called with some questions regarding taking of medications today.  Patient asked if she could take her Lisinopril today and nurse instructed pt to take lisinopril today as usual.  Pt asked about vitamins - I told pt she would have to call Telford Nab RN regarding this.  Patient was given number to call Telford Nab RN at [phone].  Pt asked if she could take Ambien tonite for sleep and nurse instructed patient that she could.  Patient asked if she could take Nasal Spray today and nurse instructed pt that she could.  PT verbalized understanding.

## 2011-03-31 NOTE — Telephone Encounter (Signed)
Spoke with patient about bowel prep and clear liquid intake.  Pt stating that bowel prep is complete and that she is taking in adequate clear liquids.  No concerns voiced.  Plans for OR in the am.

## 2011-04-01 ENCOUNTER — Ambulatory Visit (HOSPITAL_COMMUNITY): Payer: Medicare Other | Admitting: Anesthesiology

## 2011-04-01 ENCOUNTER — Other Ambulatory Visit: Payer: Self-pay | Admitting: Gynecology

## 2011-04-01 ENCOUNTER — Encounter (HOSPITAL_COMMUNITY): Payer: Self-pay | Admitting: Anesthesiology

## 2011-04-01 ENCOUNTER — Encounter (HOSPITAL_COMMUNITY)
Admission: RE | Disposition: A | Discharge status: Home / Self Care | Payer: Self-pay | Source: Ambulatory Visit | Attending: Obstetrics & Gynecology

## 2011-04-01 ENCOUNTER — Inpatient Hospital Stay (HOSPITAL_COMMUNITY)
Admission: RE | Admit: 2011-04-01 | Discharge: 2011-04-04 | DRG: 737 | Disposition: A | Discharge status: Home / Self Care | Payer: Medicare Other | Source: Ambulatory Visit | Attending: Obstetrics & Gynecology | Admitting: Obstetrics & Gynecology

## 2011-04-01 ENCOUNTER — Encounter (HOSPITAL_COMMUNITY): Payer: Self-pay

## 2011-04-01 ENCOUNTER — Encounter (HOSPITAL_COMMUNITY): Payer: Self-pay | Admitting: *Deleted

## 2011-04-01 DIAGNOSIS — C569 Malignant neoplasm of unspecified ovary: Principal | ICD-10-CM | POA: Diagnosis present

## 2011-04-01 DIAGNOSIS — I10 Essential (primary) hypertension: Secondary | ICD-10-CM | POA: Diagnosis present

## 2011-04-01 DIAGNOSIS — Z01812 Encounter for preprocedural laboratory examination: Secondary | ICD-10-CM

## 2011-04-01 DIAGNOSIS — D62 Acute posthemorrhagic anemia: Secondary | ICD-10-CM | POA: Diagnosis not present

## 2011-04-01 DIAGNOSIS — D509 Iron deficiency anemia, unspecified: Secondary | ICD-10-CM | POA: Diagnosis not present

## 2011-04-01 DIAGNOSIS — N736 Female pelvic peritoneal adhesions (postinfective): Secondary | ICD-10-CM | POA: Diagnosis not present

## 2011-04-01 DIAGNOSIS — E871 Hypo-osmolality and hyponatremia: Secondary | ICD-10-CM | POA: Diagnosis not present

## 2011-04-01 DIAGNOSIS — N856 Intrauterine synechiae: Secondary | ICD-10-CM | POA: Diagnosis not present

## 2011-04-01 HISTORY — PX: SALPINGOOPHORECTOMY: SHX82

## 2011-04-01 HISTORY — PX: ABDOMINAL HYSTERECTOMY: SHX81

## 2011-04-01 HISTORY — PX: LAPAROTOMY: SHX154

## 2011-04-01 LAB — HEMOGLOBIN AND HEMATOCRIT, BLOOD
HCT: 32.5 % — ABNORMAL LOW (ref 36.0–46.0)
Hemoglobin: 11 g/dL — ABNORMAL LOW (ref 12.0–15.0)

## 2011-04-01 SURGERY — LAPAROTOMY, EXPLORATORY
Anesthesia: General | Site: Abdomen | Wound class: Clean

## 2011-04-01 MED ORDER — METRONIDAZOLE IN NACL 5-0.79 MG/ML-% IV SOLN: 500.0000 mg | INTRAVENOUS | Status: DC

## 2011-04-01 MED ORDER — HYPROMELLOSE (GONIOSCOPIC) 2.5 % OP SOLN
1.0000 [drp] | Freq: Four times a day (QID) | OPHTHALMIC | Status: DC
  Filled 2011-04-01: qty 15

## 2011-04-01 MED ORDER — DROPERIDOL 2.5 MG/ML IJ SOLN
INTRAMUSCULAR | Status: DC | PRN
  Administered 2011-04-01: 0.625 mg via INTRAVENOUS

## 2011-04-01 MED ORDER — HYDROMORPHONE HCL PF 1 MG/ML IJ SOLN
INTRAMUSCULAR | Status: DC | PRN
  Administered 2011-04-01 (×2): .2 mg via INTRAVENOUS

## 2011-04-01 MED ORDER — KCL IN DEXTROSE-NACL 20-5-0.9 MEQ/L-%-% IV SOLN
INTRAVENOUS | Status: DC
  Administered 2011-04-01 (×2): via INTRAVENOUS
  Filled 2011-04-01 (×10): qty 1000

## 2011-04-01 MED ORDER — LISINOPRIL 10 MG PO TABS
10.0000 mg | ORAL_TABLET | Freq: Every day | ORAL | Status: DC
  Administered 2011-04-01 – 2011-04-04 (×4): 10 mg via ORAL
  Filled 2011-04-01 (×4): qty 1

## 2011-04-01 MED ORDER — DEXTROSE 5 % IV SOLN
1.0000 g | INTRAVENOUS | Status: DC | PRN
  Administered 2011-04-01: 1 g via INTRAVENOUS

## 2011-04-01 MED ORDER — HYDROMORPHONE HCL PF 1 MG/ML IJ SOLN: 0.2500 mg | INTRAMUSCULAR | Status: DC | PRN

## 2011-04-01 MED ORDER — BISACODYL 10 MG RE SUPP
10.0000 mg | Freq: Every morning | RECTAL | Status: DC
  Administered 2011-04-02 – 2011-04-03 (×2): 10 mg via RECTAL
  Filled 2011-04-01 (×3): qty 1

## 2011-04-01 MED ORDER — EPHEDRINE SULFATE 50 MG/ML IJ SOLN
INTRAMUSCULAR | Status: DC | PRN
  Administered 2011-04-01: 10 mg via INTRAVENOUS
  Administered 2011-04-01 (×2): 5 mg via INTRAVENOUS

## 2011-04-01 MED ORDER — KCL IN DEXTROSE-NACL 20-5-0.9 MEQ/L-%-% IV SOLN
INTRAVENOUS | Status: DC
  Administered 2011-04-01: 11:00:00 via INTRAVENOUS
  Filled 2011-04-01 (×3): qty 1000

## 2011-04-01 MED ORDER — LACTATED RINGERS IV SOLN
INTRAVENOUS | Status: DC
  Administered 2011-04-01 (×2): via INTRAVENOUS

## 2011-04-01 MED ORDER — DEXAMETHASONE SODIUM PHOSPHATE 10 MG/ML IJ SOLN
INTRAMUSCULAR | Status: DC | PRN
  Administered 2011-04-01: 10 mg via INTRAVENOUS

## 2011-04-01 MED ORDER — SODIUM CHLORIDE 0.9 % IJ SOLN: 9.0000 mL | INTRAMUSCULAR | Status: DC | PRN

## 2011-04-01 MED ORDER — HYDROMORPHONE 0.3 MG/ML IV SOLN
INTRAVENOUS | Status: AC
  Filled 2011-04-01: qty 25

## 2011-04-01 MED ORDER — HYDROMORPHONE 0.3 MG/ML IV SOLN
INTRAVENOUS | Status: DC
  Administered 2011-04-01: 0.799 mg via INTRAVENOUS
  Administered 2011-04-01: 0.3 mg via INTRAVENOUS
  Administered 2011-04-01 – 2011-04-02 (×3): 0.2 mg via INTRAVENOUS
  Administered 2011-04-02: 0.199 mg via INTRAVENOUS

## 2011-04-01 MED ORDER — ONDANSETRON HCL 4 MG/2ML IJ SOLN: 4.0000 mg | Freq: Four times a day (QID) | INTRAMUSCULAR | Status: DC | PRN

## 2011-04-01 MED ORDER — ZOLPIDEM TARTRATE 5 MG PO TABS
5.0000 mg | ORAL_TABLET | Freq: Every evening | ORAL | Status: DC | PRN
  Administered 2011-04-01 – 2011-04-03 (×3): 10 mg via ORAL
  Filled 2011-04-01: qty 1
  Filled 2011-04-01 (×2): qty 2
  Filled 2011-04-01: qty 1

## 2011-04-01 MED ORDER — ROCURONIUM BROMIDE 100 MG/10ML IV SOLN
INTRAVENOUS | Status: DC | PRN
  Administered 2011-04-01: 40 mg via INTRAVENOUS
  Administered 2011-04-01: 10 mg via INTRAVENOUS

## 2011-04-01 MED ORDER — DIPHENHYDRAMINE HCL 50 MG/ML IJ SOLN: 12.5000 mg | Freq: Four times a day (QID) | INTRAMUSCULAR | Status: DC | PRN

## 2011-04-01 MED ORDER — POLYVINYL ALCOHOL 1.4 % OP SOLN
1.0000 [drp] | Freq: Four times a day (QID) | OPHTHALMIC | Status: DC
  Administered 2011-04-01 – 2011-04-04 (×10): 1 [drp] via OPHTHALMIC
  Filled 2011-04-01: qty 15

## 2011-04-01 MED ORDER — ACETAMINOPHEN 10 MG/ML IV SOLN
INTRAVENOUS | Status: AC
  Filled 2011-04-01: qty 100

## 2011-04-01 MED ORDER — ONDANSETRON HCL 4 MG/2ML IJ SOLN
INTRAMUSCULAR | Status: DC | PRN
  Administered 2011-04-01: 4 mg via INTRAVENOUS

## 2011-04-01 MED ORDER — GLYCOPYRROLATE 0.2 MG/ML IJ SOLN
INTRAMUSCULAR | Status: DC | PRN
  Administered 2011-04-01: 0.2 mg via INTRAVENOUS
  Administered 2011-04-01: .6 mg via INTRAVENOUS

## 2011-04-01 MED ORDER — NALOXONE HCL 0.4 MG/ML IJ SOLN: 0.4000 mg | INTRAMUSCULAR | Status: DC | PRN

## 2011-04-01 MED ORDER — CIPROFLOXACIN IN D5W 400 MG/200ML IV SOLN: 400.0000 mg | INTRAVENOUS | Status: DC

## 2011-04-01 MED ORDER — AZELASTINE HCL 0.1 % NA SOLN
1.0000 | Freq: Two times a day (BID) | NASAL | Status: DC
  Administered 2011-04-01 – 2011-04-04 (×6): 1 via NASAL
  Filled 2011-04-01: qty 30

## 2011-04-01 MED ORDER — DIPHENHYDRAMINE HCL 12.5 MG/5ML PO ELIX: 12.5000 mg | ORAL_SOLUTION | Freq: Four times a day (QID) | ORAL | Status: DC | PRN

## 2011-04-01 MED ORDER — LIDOCAINE HCL (CARDIAC) 20 MG/ML IV SOLN
INTRAVENOUS | Status: DC | PRN
  Administered 2011-04-01: 80 mg via INTRAVENOUS

## 2011-04-01 MED ORDER — ONDANSETRON HCL 4 MG/2ML IJ SOLN
4.0000 mg | Freq: Four times a day (QID) | INTRAMUSCULAR | Status: DC | PRN
  Administered 2011-04-01 – 2011-04-02 (×3): 4 mg via INTRAVENOUS
  Filled 2011-04-01: qty 2

## 2011-04-01 MED ORDER — POLYETHYL GLYCOL-PROPYL GLYCOL 0.4-0.3 % OP SOLN: 1.0000 [drp] | Freq: Four times a day (QID) | OPHTHALMIC | Status: DC

## 2011-04-01 MED ORDER — ONDANSETRON HCL 4 MG PO TABS
4.0000 mg | ORAL_TABLET | Freq: Four times a day (QID) | ORAL | Status: DC | PRN
  Administered 2011-04-02: 4 mg via ORAL
  Filled 2011-04-01: qty 1

## 2011-04-01 MED ORDER — HYDROMORPHONE HCL PF 1 MG/ML IJ SOLN
INTRAMUSCULAR | Status: AC
  Filled 2011-04-01: qty 1

## 2011-04-01 MED ORDER — HYDROMORPHONE HCL PF 1 MG/ML IJ SOLN
0.2500 mg | INTRAMUSCULAR | Status: DC | PRN
  Administered 2011-04-01 (×2): 0.25 mg via INTRAVENOUS

## 2011-04-01 MED ORDER — NEOSTIGMINE METHYLSULFATE 1 MG/ML IJ SOLN
INTRAMUSCULAR | Status: DC | PRN
  Administered 2011-04-01: 4 mg via INTRAVENOUS

## 2011-04-01 MED ORDER — PROPOFOL 10 MG/ML IV EMUL
INTRAVENOUS | Status: DC | PRN
  Administered 2011-04-01: 130 mg via INTRAVENOUS

## 2011-04-01 MED ORDER — FENTANYL CITRATE 0.05 MG/ML IJ SOLN
INTRAMUSCULAR | Status: DC | PRN
  Administered 2011-04-01: 50 ug via INTRAVENOUS
  Administered 2011-04-01 (×2): 100 ug via INTRAVENOUS

## 2011-04-01 MED ORDER — HYDROMORPHONE 0.3 MG/ML IV SOLN: INTRAVENOUS | Status: DC

## 2011-04-01 MED ORDER — ENOXAPARIN SODIUM 40 MG/0.4ML ~~LOC~~ SOLN
SUBCUTANEOUS | Status: AC
  Administered 2011-04-01: 40 mg via SUBCUTANEOUS
  Filled 2011-04-01: qty 0.4

## 2011-04-01 MED ORDER — ENOXAPARIN SODIUM 40 MG/0.4ML ~~LOC~~ SOLN
40.0000 mg | SUBCUTANEOUS | Status: AC
  Administered 2011-04-01: 40 mg via SUBCUTANEOUS

## 2011-04-01 MED ORDER — PANTOPRAZOLE SODIUM 40 MG PO TBEC
40.0000 mg | DELAYED_RELEASE_TABLET | Freq: Every day | ORAL | Status: DC
  Administered 2011-04-02 – 2011-04-04 (×4): 40 mg via ORAL
  Filled 2011-04-01 (×3): qty 1

## 2011-04-01 MED ORDER — ACETAMINOPHEN 10 MG/ML IV SOLN
INTRAVENOUS | Status: DC | PRN
  Administered 2011-04-01: 1000 mg via INTRAVENOUS

## 2011-04-01 MED ORDER — PROMETHAZINE HCL 25 MG/ML IJ SOLN: 6.2500 mg | INTRAMUSCULAR | Status: DC | PRN

## 2011-04-01 MED ORDER — CEFOXITIN SODIUM-DEXTROSE 1-4 GM-% IV SOLR (PREMIX)
INTRAVENOUS | Status: AC
  Filled 2011-04-01: qty 50

## 2011-04-01 MED ORDER — 0.9 % SODIUM CHLORIDE (POUR BTL) OPTIME
TOPICAL | Status: DC | PRN
  Administered 2011-04-01: 2000 mL

## 2011-04-01 MED ORDER — NADOLOL 20 MG PO TABS
10.0000 mg | ORAL_TABLET | Freq: Every day | ORAL | Status: DC
  Administered 2011-04-02: 10 mg via ORAL
  Administered 2011-04-03: 20 mg via ORAL
  Administered 2011-04-04: 10 mg via ORAL
  Filled 2011-04-01 (×3): qty 1

## 2011-04-01 MED ORDER — KCL IN DEXTROSE-NACL 20-5-0.45 MEQ/L-%-% IV SOLN
INTRAVENOUS | Status: AC
  Administered 2011-04-01: 1000 mL
  Filled 2011-04-01: qty 1000

## 2011-04-01 SURGICAL SUPPLY — 46 items
ATTRACTOMAT 16X20 MAGNETIC DRP (DRAPES) ×5 IMPLANT
BAG URINE DRAINAGE (UROLOGICAL SUPPLIES) ×5 IMPLANT
BLADE EXTENDED COATED 6.5IN (ELECTRODE) ×5 IMPLANT
CANISTER SUCTION 2500CC (MISCELLANEOUS) ×5 IMPLANT
CHLORAPREP W/TINT 10.5 ML (MISCELLANEOUS) ×5 IMPLANT
CLIP TI MEDIUM LARGE 6 (CLIP) ×10 IMPLANT
CLOTH BEACON ORANGE TIMEOUT ST (SAFETY) ×5 IMPLANT
COVER SURGICAL LIGHT HANDLE (MISCELLANEOUS) ×5 IMPLANT
DRAPE UTILITY 15X26 (DRAPE) ×5 IMPLANT
DRAPE UTILITY XL STRL (DRAPES) ×5 IMPLANT
DRAPE WARM FLUID 44X44 (DRAPE) ×5 IMPLANT
ELECT BLADE 6.5 EXT (BLADE) ×5 IMPLANT
ELECT REM PT RETURN 9FT ADLT (ELECTROSURGICAL) ×5
ELECTRODE REM PT RTRN 9FT ADLT (ELECTROSURGICAL) ×4 IMPLANT
FLOSEAL 10ML (HEMOSTASIS) ×5 IMPLANT
GAUZE SPONGE 4X4 16PLY XRAY LF (GAUZE/BANDAGES/DRESSINGS) ×5 IMPLANT
GLOVE BIO SURGEON STRL SZ 6.5 (GLOVE) ×5 IMPLANT
GLOVE BIO SURGEON STRL SZ7.5 (GLOVE) ×15 IMPLANT
GLOVE BIOGEL M STRL SZ7.5 (GLOVE) ×25 IMPLANT
GOWN PREVENTION PLUS LG XLONG (DISPOSABLE) ×5 IMPLANT
GOWN PREVENTION PLUS XLARGE (GOWN DISPOSABLE) ×5 IMPLANT
GOWN STRL NON-REIN LRG LVL3 (GOWN DISPOSABLE) ×5 IMPLANT
GOWN STRL REIN XL XLG (GOWN DISPOSABLE) ×15 IMPLANT
NS IRRIG 1000ML POUR BTL (IV SOLUTION) ×20 IMPLANT
PACK ABDOMINAL WL (CUSTOM PROCEDURE TRAY) ×5 IMPLANT
SHEET LAVH (DRAPES) ×5 IMPLANT
SPONGE LAP 18X18 X RAY DECT (DISPOSABLE) ×5 IMPLANT
STAPLER SKIN PROX WIDE 3.9 (STAPLE) ×5 IMPLANT
STAPLER VISISTAT 35W (STAPLE) ×5 IMPLANT
SUT ETHILON 1 LR 30 (SUTURE) IMPLANT
SUT PDS AB 1 CTXB1 36 (SUTURE) ×10 IMPLANT
SUT SILK 2 0 (SUTURE) ×1
SUT SILK 2 0 30  PSL (SUTURE)
SUT SILK 2 0 30 PSL (SUTURE) IMPLANT
SUT SILK 2-0 18XBRD TIE 12 (SUTURE) ×4 IMPLANT
SUT VIC AB 0 CT1 36 (SUTURE) ×15 IMPLANT
SUT VIC AB 2-0 CT2 27 (SUTURE) ×25 IMPLANT
SUT VIC AB 2-0 SH 27 (SUTURE) ×3
SUT VIC AB 2-0 SH 27X BRD (SUTURE) ×12 IMPLANT
SUT VIC AB 3-0 CTX 36 (SUTURE) IMPLANT
SUT VICRYL 2 0 18  UND BR (SUTURE) ×1
SUT VICRYL 2 0 18 UND BR (SUTURE) ×4 IMPLANT
TOWEL OR 17X26 10 PK STRL BLUE (TOWEL DISPOSABLE) ×5 IMPLANT
TOWEL OR NON WOVEN STRL DISP B (DISPOSABLE) ×5 IMPLANT
TRAY FOLEY CATH 14FRSI W/METER (CATHETERS) ×5 IMPLANT
WATER STERILE IRR 1500ML POUR (IV SOLUTION) ×5 IMPLANT

## 2011-04-01 NOTE — Op Note (Signed)
Preoperative diagnosis: Advanced primary peritoneal carcinoma  Postoperative diagnosis: Same with excellent response to 3 cycles of chemotherapy   Procedure: Exploratory laparotomy, omentectomy, total abdominal hysterectomy and bilateral salpingo-oophorectomy  Surgeon Reuel Boom L. Stanford Breed  Asst.: Antionette Char M.D. Telford Nab RN  Anesthesia Gen. with oral tracheal tube  Estimated blood loss 300 cc  Surgical findings:  At the time of exploratory laparotomy the patient had approximately 800 cc of straw-colored ascites. Exploration of the upper abdomen including the diaphragm small and large bowel liver capsule and the pelvic organs were all normal with no evidence of metastatic disease. There were extensive adhesions in the pelvis. At the completion of the surgical procedure there was no residual disease.  Procedure  The patient was brought to the operating room after satisfactory attainment of general anesthesia was placed in the modified lithotomy position in Colbert stirrups. The abdomen perineum and vagina were prepped, a Foley catheter was inserted, and the patient was draped. The abdomen was entered through a low midline incision. The abdomen and pelvis were explored with the above-noted findings. A Bookwalter retractor was positioned and the omentum exposed. An infracolic omentectomy was then performed isolated vascular pedicles which were clamped divided and ligated with 2-0 Vicryl.  The Bookwalter retractor was repositioned and the pelvis explored. Small bowel was packed out of the pelvis. Extensive adhesions were encountered and were lysed with sharp dissection. Considerable amount of oozing from all peritoneal surfaces and continued production of ascites throughout the surgical procedure. The round ligaments were divided and the retroperitoneal spaces were opened. The ureter was identified. Ovarian vessels were skeletonized, clamped, cu,t free tied and suture-ligated. The  bladder flap was advanced with sharp and blunt dissection.The uterine vessels were skeletonized and clamped cut and suture ligated. The rectovaginal septum was developed further mobilizing the uterus and cervix. In a stepwise fashion the paracervical and cardinal ligaments were clamped cut and suture ligated. The vaginal angles were encountered and crossclamped. The vagina was transected from its connection to the cervix. The specimen was handed off the operative field for permanent section. The vagina was closed with interrupted figure-of-eight sutures of 0 Vicryl. The pelvis was irrigated and additional hemostasis hemostasis achieved with cautery. Patient continued to have a slight amount of oozing and FloSeal was placed across all peritoneal surfaces in the pelvis. The upper abdomen was reexplored and found to be hemostatic.  The packs and retractors removed and  the anterior abdominal wall was closed in layers. The first layer was a running mass closure of #1 PDS. Subcutaneous tissue irrigated and hemostasis achieved with cautery and the skin was closed with skin staples  The patient was awakened from anesthesia and taken to the recovery room in satisfactory condition.Sponge, needle and instrument counts correct x2

## 2011-04-01 NOTE — Preoperative (Signed)
Beta Blockers   Reason not to administer Beta Blockers:Took Nadolol at 0330 04-01-11

## 2011-04-01 NOTE — Anesthesia Postprocedure Evaluation (Signed)
  Anesthesia Post-op Note  Patient: [name]  Procedure(s) Performed: Procedure(s) (LRB): EXPLORATORY LAPAROTOMY (N/A) HYSTERECTOMY ABDOMINAL (N/A) BILATERAL SALPINGECTOMY (N/A)  Patient Location: PACU  Anesthesia Type: General  Level of Consciousness: awake and alert   Airway and Oxygen Therapy: Patient Spontanous Breathing  Post-op Pain: mild  Post-op Assessment: Post-op Vital signs reviewed, Patient's Cardiovascular Status Stable, Respiratory Function Stable, Patent Airway and No signs of Nausea or vomiting  Post-op Vital Signs: stable  Complications: No apparent anesthesia complications

## 2011-04-01 NOTE — Anesthesia Preprocedure Evaluation (Addendum)
Anesthesia Evaluation  Patient identified by MRN, date of birth, ID band Patient awake    Reviewed: Allergy & Precautions, H&P , NPO status , Patient's Chart, lab work & pertinent test results  Airway Mallampati: II TM Distance: <3 FB Neck ROM: Full    Dental No notable dental hx.    Pulmonary neg pulmonary ROS,  clear to auscultation  Pulmonary exam normal       Cardiovascular hypertension, Pt. on medications Regular Normal    Neuro/Psych Negative Neurological ROS  Negative Psych ROS   GI/Hepatic Neg liver ROS, GERD-  Medicated,  Endo/Other  Negative Endocrine ROS  Renal/GU negative Renal ROS  Genitourinary negative   Musculoskeletal negative musculoskeletal ROS (+)   Abdominal   Peds negative pediatric ROS (+)  Hematology negative hematology ROS (+)   Anesthesia Other Findings   Reproductive/Obstetrics negative OB ROS                           Anesthesia Physical Anesthesia Plan  ASA: II  Anesthesia Plan: General   Post-op Pain Management:    Induction: Intravenous  Airway Management Planned: Oral ETT  Additional Equipment:   Intra-op Plan:   Post-operative Plan: Extubation in OR  Informed Consent: I have reviewed the patients History and Physical, chart, labs and discussed the procedure including the risks, benefits and alternatives for the proposed anesthesia with the patient or authorized representative who has indicated his/her understanding and acceptance.   Dental advisory given  Plan Discussed with: CRNA  Anesthesia Plan Comments:         Anesthesia Quick Evaluation

## 2011-04-01 NOTE — Plan of Care (Signed)
Problem: Diagnosis - Type of Surgery Goal: General Surgical Patient Education (See Patient Education module for education specifics)  Outcome: Progressing Exp lap hysterectomy with bso

## 2011-04-01 NOTE — H&P (View-Only) (Signed)
Consult Note: Gyn-Onc  [name] 78 y.o. female  CC:  Chief Complaint  Patient presents with  . 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 Her illness began with several weeks of diarrhea and abdominal bloating the fall of 2012. She was tested several times for C. difficile was negative. Dr. Taavon at that time recommended an ultrasound and ultimately a CT scan. Ultrasound showed significant ascites and her CA 125 was markedly elevated which is when we saw her. CT imaging was most consistent with markedly advanced disease that would most likely preclude optimal debulking. This in conjunction with her poor performance status the decision was made to proceed with neoadjuvant chemotherapy. She had a high-volume paracentesis to confirm the diagnosis of a gynecologic malignancy.  She has undergone 3 cycles of neoadjuvant chemotherapy under the care of Dr. Livesay with paclitaxel and carboplatin. Her CA 125 prior cycle #1 was 204 and most recently on January 18 prior to cycle #2 her CA 125 was 12.6. She also had a CT scan last week that showed marked resolution of her ascites and marked improvement in the omental caking. There is no mention of any significant carcinomatosis. There is no significant adenopathy. The adnexa and uterus are within normal limits.  Interval History:   Review of Systems: She states that she is "feeling exceptionally well". Her bloating is completely resolved and her diarrhea is gone. She states she feels normal. Her appetite is much better. The breathlessness that she had before is completely resolved. She is able to go shopping and do her usual activities. She currently complains that the biggest issue is that she is receiving Neupogen and that is as bad as "chemotherapy".  Current Meds:  Outpatient Encounter Prescriptions as of 03/19/2011  Medication Sig Dispense Refill  .  acetaminophen (TYLENOL) 325 MG tablet Take 650 mg by mouth as needed.        . Cholecalciferol (VITAMIN D) 2000 UNITS tablet Take 2,000 Units by mouth daily.        . dexamethasone (DECADRON) 4 MG tablet TAKE 5 TABS= 20 MG 12 HRS. AND 6 HRS. PRIOR TO CHEMOTHERAPY.  EACH DOSE WITH FOOD.  10 tablet  1  . lisinopril (PRINIVIL,ZESTRIL) 10 MG tablet Take 5 mg by mouth daily.        . LORazepam (ATIVAN) 0.5 MG tablet TAKE 1 TABLET ORALLY OR UNDER THE TONGUE NIGHT OF CHEMOTHERAPY WHETHER  OR NOT ANY NAUSEA AND EVERY 4-6 HR. AS NEEDED FOR NAUSEA.  20 tablet  0  . magnesium oxide (MAG-OX) 400 MG tablet Take 1 tablet (400 mg total) by mouth daily.  30 tablet  0  . Multiple Vitamins-Minerals (CENTRUM SILVER PO) Take 1 tablet by mouth daily.        . nadolol (CORGARD) 20 MG tablet Take 10 mg by mouth daily.       . nystatin cream (MYCOSTATIN) Apply 1 application topically 3 (three) times daily as needed.  30 g  1  . omega-3 acid ethyl esters (LOVAZA) 1 G capsule Take 1 g by mouth daily.      . ondansetron (ZOFRAN) 8 MG tablet TAKE 1-2 TABLETS EVERY 12-24 HRS. AS NEEDED FOR NAUSEA.  TAKE 1 TABLET IN THE MORNING AFTER CHEMOTHERAPY WHETHER OR NOT NAUSEA  20 tablet  1  . pantoprazole (PROTONIX) 40 MG tablet Take 40 mg by mouth daily.        .   zolpidem (AMBIEN) 10 MG tablet Take 5-10 mg by mouth at bedtime as needed.        . fluticasone (FLONASE) 50 MCG/ACT nasal spray Place 2 sprays into the nose as needed.         Facility-Administered Encounter Medications as of 03/19/2011  Medication Dose Route Frequency Provider Last Rate Last Dose  . CARBOplatin (PARAPLATIN) 430 mg in sodium chloride 0.9 % 250 mL chemo infusion  430 mg Intravenous Once Lennis P Livesay, MD      . PACLitaxel (TAXOL) 264 mg in dextrose 5 % 250 mL chemo infusion (> 80mg/m2)  135 mg/m2 (Treatment Plan Actual) Intravenous Once Lennis P Livesay, MD        Allergy:  Allergies  Allergen Reactions  . Iodine     REACTION: hives  . Morphine And  Related     Given after knee replacement and code blue occurred  . Penicillins     REACTION: hives  . Shellfish Allergy     Pt has shellfish allergy only.  Has had IV contrast x 2 and did fine.    Social Hx:   History   Social History  . Marital Status: Married    Spouse Name: N/A    Number of Children: N/A  . Years of Education: N/A   Occupational History  . Not on file.   Social History Main Topics  . Smoking status: Former Smoker    Quit date: 02/11/1952  . Smokeless tobacco: Not on file  . Alcohol Use: No  . Drug Use: No  . Sexually Active: No   Other Topics Concern  . Not on file   Social History Narrative  . No narrative on file    Past Surgical Hx:  Past Surgical History  Procedure Date  . Tonsillectomy   . Appendectomy   . Joint replacement     R knee in 2008, 5 operations on L knee    Past Medical Hx:  Past Medical History  Diagnosis Date  . Ascites   . Cystitis   . GERD (gastroesophageal reflux disease)   . Hypertension   . Hyponatremia   . Hyperlipidemia   . Osteopenia   . Postmenopausal atrophic vaginitis   . Vulvitis   . Staph infection 2010    after knee replacement  . Urinary frequency   . Ovarian cancer 01/25/2011    Family Hx:  Family History  Problem Relation Age of Onset  . Heart attack Brother   . Diabetes Brother   . Cancer Other     Bladder cancer    Vitals:  Blood pressure 140/90, pulse 80, temperature 97.9 F (36.6 C), temperature source Oral, resp. rate 20, height 5' 3.94" (1.624 m), weight 171 lb 12.8 oz (77.928 kg).  Physical Exam: Well-nourished well-developed female in no acute distress.  Neck: No lymphadenopathy no thyromegaly.  Lungs: Clear to auscultation.  Cardiovascular: Regular rate and rhythm.  Abdomen: Soft nontender, nondistended, no palpable masses, no hepatosplenomegaly, no ascites.  Groins: No lymphadenopathy.  Extremities: No edema.  Pelvic: External genitalia within normal limits.  Bimanual examination the cervix is palpably normal the purposes of normal size shape and consistency there are no adnexal masses. Rectal confirms there is no nodularity.  Assessment/Plan: 78-year-old with stage IIIc ovarian versus primary peritoneal carcinoma who has had a dramatic response to 3 cycles of paclitaxel and carboplatin.  I believe she would be an excellent candidate for interval debulking at this point. She is tentatively scheduled for   surgery for February 19 with Dr. Clark Pearson. She was somewhat upset to hear that I would not be doing her surgery but I discussed with her that a previously scheduled date that I was to be here has been removed as I have been called to jury duty.   After discussing the surgery that would be involved including exploratory laparotomy TAH/BSO omentectomy and that this is something that Dr. Clark Pearson is well versed in doing she is considering having her surgery here at [facility] with Dr. Clark Pearson. Alternatively she was offered the opportunity to have the surgery at [facility]. We discussed at length the convalescence from the surgery in-house course, the surgical debulking, risks of surgery, and need for additional chemotherapy. 40 minutes face to face time was spent with the patient and her family answering her numerous questions which were all quite appropriate.  She is very concerned about making sure that he is aware of her morphine allergy, the staph infection she previously has had, and her risks of hyponatremia. I discussed with her as did Nancy these are things that are well documented in her medical record and we're aware. She would like to be the first case and we can make that happen for her. At this point she'll consider whether or not she will surgery he [facility] on February 19. She was offered the opportunity to meet briefly meet Dr. Clark Pearson he is here for clinic on February 11. He to the patient and her daughter Julie will call us to  determine her final dispositioned. Erion Weightman A., MD 03/19/2011, 4:57 PM   

## 2011-04-01 NOTE — Transfer of Care (Signed)
Immediate Anesthesia Transfer of Care Note  Patient: LASHAUNA ARPIN  Procedure(s) Performed: Procedure(s) (LRB): EXPLORATORY LAPAROTOMY (N/A) HYSTERECTOMY ABDOMINAL (N/A) BILATERAL SALPINGECTOMY (N/A)  Patient Location: PACU  Anesthesia Type: General  Level of Consciousness: awake, alert , oriented and patient cooperative  Airway & Oxygen Therapy: Patient Spontanous Breathing and Patient connected to face mask oxygen  Post-op Assessment: Report given to PACU RN, Post -op Vital signs reviewed and stable and Patient moving all extremities  Post vital signs: Reviewed and stable  Complications: No apparent anesthesia complications

## 2011-04-01 NOTE — Interval H&P Note (Signed)
History and Physical Interval Note:  04/01/2011 7:20 AM  [name]  has presented today for surgery, with the diagnosis of ovarian cancer  The various methods of treatment have been discussed with the patient and family. After consideration of risks, benefits and other options for treatment, the patient has consented to  Procedure(s) (LRB): EXPLORATORY LAPAROTOMY (N/A) HYSTERECTOMY ABDOMINAL (N/A) BILATERAL SALPINGECTOMY (N/A) as a surgical intervention .  The patients' history has been reviewed, patient examined, no change in status, stable for surgery.  I have reviewed the patients' chart and labs.  Questions were answered to the patient's satisfaction.     CLARKE-PEARSON,Rykker Coviello L

## 2011-04-02 LAB — BASIC METABOLIC PANEL
BUN: 18 mg/dL (ref 6–23)
BUN: 11 mg/dL (ref 6–23)
CO2: 22 mEq/L (ref 19–32)
CO2: 21 mEq/L (ref 19–32)
Calcium: 7.8 mg/dL — ABNORMAL LOW (ref 8.4–10.5)
Calcium: 8 mg/dL — ABNORMAL LOW (ref 8.4–10.5)
Chloride: 99 mEq/L (ref 96–112)
Chloride: 101 mEq/L (ref 96–112)
Creatinine, Ser: 1.09 mg/dL (ref 0.50–1.10)
Creatinine, Ser: 0.9 mg/dL (ref 0.50–1.10)
GFR calc Af Amer: 54 mL/min — ABNORMAL LOW (ref >90)
GFR calc Af Amer: 69 mL/min — ABNORMAL LOW (ref >90)
GFR calc non Af Amer: 47 mL/min — ABNORMAL LOW (ref >90)
GFR calc non Af Amer: 59 mL/min — ABNORMAL LOW (ref >90)
Glucose, Bld: 145 mg/dL — ABNORMAL HIGH (ref 70–99)
Glucose, Bld: 135 mg/dL — ABNORMAL HIGH (ref 70–99)
Potassium: 4.4 mEq/L (ref 3.5–5.1)
Potassium: 4.4 mEq/L (ref 3.5–5.1)
Sodium: 128 mEq/L — ABNORMAL LOW (ref 135–145)
Sodium: 130 mEq/L — ABNORMAL LOW (ref 135–145)

## 2011-04-02 LAB — CBC
HCT: 22 % — ABNORMAL LOW (ref 36.0–46.0)
Hemoglobin: 7.6 g/dL — ABNORMAL LOW (ref 12.0–15.0)
MCH: 29.5 pg (ref 26.0–34.0)
MCHC: 34.5 g/dL (ref 30.0–36.0)
MCV: 85.3 fL (ref 78.0–100.0)
Platelets: 118 10*3/uL — ABNORMAL LOW (ref 150–400)
RBC: 2.58 MIL/uL — ABNORMAL LOW (ref 3.87–5.11)
RDW: 22.2 % — ABNORMAL HIGH (ref 11.5–15.5)
WBC: 8.7 10*3/uL (ref 4.0–10.5)

## 2011-04-02 LAB — PREPARE RBC (CROSSMATCH)

## 2011-04-02 MED ORDER — ONDANSETRON 8 MG/NS 50 ML IVPB
8.0000 mg | Freq: Four times a day (QID) | INTRAVENOUS | Status: DC | PRN
  Filled 2011-04-02: qty 8

## 2011-04-02 MED ORDER — LACTATED RINGERS IV BOLUS (SEPSIS)
500.0000 mL | Freq: Once | INTRAVENOUS | Status: AC
  Administered 2011-04-01: 500 mL via INTRAVENOUS

## 2011-04-02 MED ORDER — METOCLOPRAMIDE HCL 5 MG/ML IJ SOLN
5.0000 mg | Freq: Once | INTRAMUSCULAR | Status: AC
  Administered 2011-04-02: 5 mg via INTRAVENOUS
  Filled 2011-04-02: qty 2

## 2011-04-02 MED ORDER — PHENOL 1.4 % MT LIQD
1.0000 | OROMUCOSAL | Status: DC | PRN
  Filled 2011-04-02 (×2): qty 177

## 2011-04-02 MED ORDER — ONDANSETRON HCL 4 MG/2ML IJ SOLN
4.0000 mg | Freq: Four times a day (QID) | INTRAMUSCULAR | Status: DC | PRN
  Filled 2011-04-02 (×2): qty 2

## 2011-04-02 MED ORDER — LACTATED RINGERS IV BOLUS (SEPSIS)
500.0000 mL | Freq: Once | INTRAVENOUS | Status: AC
  Administered 2011-04-02: 500 mL via INTRAVENOUS

## 2011-04-02 NOTE — Progress Notes (Signed)
Patient Hgb 7.6 this AM, pre-op hgb 11.0, VSS, order to D/C foley this AM, patient c/o dizzy lightheaded on previous attempts to get OOB, OOB at bedside x1  last night, syncopal x1 early this AM. MD on call notified, and improved urine output this AM post bolus. Orders received and initiated, to keep foley this AM until pt able to get OOB/hemodynamically stable. To continue to moniter.

## 2011-04-02 NOTE — Progress Notes (Signed)
RN agrees with prior shift assessment done. RN assessed that there was no change evidenced in the pt's previously assessed condition. Marcelino Duster, RN

## 2011-04-02 NOTE — Progress Notes (Signed)
Pt has been educated regarding correct use of Incentive Spirometer (IS). Pt verbalizes understanding of correct use. Pt states that there is no further questions regarding correct use of the IS. Paislie Tessler Lorraine Elin Seats, RN    

## 2011-04-02 NOTE — Progress Notes (Signed)
Called to pt room at 0225 per RN Simona Huh . While ambulating to BR patient had syncopal episode. Assisted back to bed and placed on Code blue monitor with pads. Post op today hysterectomy, with low uop tonight, MD made aware tonight.   Upon my arrival with [facility] RN pt found in bed. Lethargic but able to answer orientation questions correctly, MAEW. Pt denies pain, denies sob. Complains of being hot and mild nausea only. BP upon my arrival  0230 160/90, SR 85 bpm, po2 97 on 2 LNC, rr15. Gray Bernhardt encouraged to notify the surgeon on pt episode, and suggest more fluids due to continued low uop. Pt resting at this time. VSS 0245 122/77 bp, SR 98 bpm, po2 98, rr14.

## 2011-04-02 NOTE — Progress Notes (Addendum)
Patient ID: [name], female   DOB: 08/22/1929, 78 y.o.   MRN: [id number] 1 Day Post-Op Procedure(s) (LRB): EXPLORATORY LAPAROTOMY (N/A) HYSTERECTOMY ABDOMINAL (N/A) BILATERAL SALPINGECTOMY (N/A)  Subjective: Patient reports dizziness, sore throat    Objective: Vital signs in last 24 hours: Temp:  [97.5 F (36.4 C)-98.4 F (36.9 C)] 98.3 F (36.8 C) (02/20 0556) Pulse Rate:  [45-98] 76  (02/20 0556) Resp:  [12-18] 18  (02/20 0812) BP: (106-164)/(68-95) 114/70 mmHg (02/20 0556) SpO2:  [97 %-100 %] 100 % (02/20 0812) Weight:  [76.658 kg (169 lb)] 76.658 kg (169 lb) (02/19 1045) Last BM Date: 04/01/11  Intake/Output from previous day: 02/19 0701 - 02/20 0700 In: 7494.7 [P.O.:1255; I.V.:6239.7] Out: 1120 [Urine:820; Blood:300]     Physical Examination: General: alert Resp: clear to auscultation bilaterally Cardio: regular rate and rhythm, S1, S2 normal, no murmur, click, rub or gallop GI: abnormal findings:  hypoactive bowel sounds Extremities: extremities normal, atraumatic, no cyanosis or edema I: C/D/I Labs: WBC/Hgb/Hct/Plts:  8.7/7.6/22.0/118 (02/20 0400) BUN/Cr/glu/ALT/AST/amyl/lip:  18/1.09/--/--/--/--/-- (02/20 0400)   Assessment:  78 y.o. s/p Procedure(s): EXPLORATORY LAPAROTOMY HYSTERECTOMY ABDOMINAL BILATERAL SALPINGECTOMY: anemia Pain:  Pain is well-controlled on PCA .  Heme:Anemia: acute blood loss/recent chemo rx; symptomatic--orthostatic  CV: Hypertension:  controlled. Current treatment:  lisinopril (Prinivil).  GI:  Tolerating po: Yes   FEN: Hyponatremia--h/o; exacerbated by stress of surgery  Prophylaxis: intermittent pneumatic compression boots.  Plan: Advance diet Transfuse 2 units PRBC Restrict free H20; follow lytes   LOS: 1 day    JACKSON-MOORE,Jaylyne Breese A 04/02/2011, 9:46 AM       Risks/benefits of a blood transfusion were reviewed with the patient.

## 2011-04-02 NOTE — Progress Notes (Addendum)
Pateint with decreased output prior shift, Night  MD on call  Notified, bolus given x1. Pt c/o nausea, given PO antiemetic per pt request prefer over IV antiemetic at that time. Upon reassessment of pt nausea, pt continues to c/o nausea, to assist pt up to bedside attempt pass gas, noted to burp x1 at this time. When patient  Dangle at bedside, patient reports feeling lightheaded and wants to lay back down. As patient lays back down, pt becomes nonresponsive, eyes rolling back, appearing to be gasping for air with some spastic movements, then becomes limp. Rapid response was called. O2 sats remained stable in 91+ %, SBP elevated 170-160, HR 100-102 during this time of nonresponsiveness. Pt began to make some incomprehensible verbal responses after approximately 5-10 minutes with some mumbling, pt then states she is hot.  Pt then able to state her name and where she is at this time, pt is unaware of syncopal episode. Patient denies chest pain , only c/o being hot and nausea. No vaginal or incisional bleeding noted.  VS WNL. Urine output has improved since first bolus. MD on call notified of above and orders given and initiated. To continue to moniter. Patient does not want family notified of event at this time, states she will tell them when they come back.

## 2011-04-03 DIAGNOSIS — D62 Acute posthemorrhagic anemia: Secondary | ICD-10-CM | POA: Diagnosis not present

## 2011-04-03 DIAGNOSIS — D509 Iron deficiency anemia, unspecified: Secondary | ICD-10-CM | POA: Diagnosis not present

## 2011-04-03 HISTORY — DX: Acute posthemorrhagic anemia: D62

## 2011-04-03 LAB — CBC
HCT: 27.2 % — ABNORMAL LOW (ref 36.0–46.0)
Hemoglobin: 9.7 g/dL — ABNORMAL LOW (ref 12.0–15.0)
MCH: 30.7 pg (ref 26.0–34.0)
MCHC: 35.7 g/dL (ref 30.0–36.0)
MCV: 86.1 fL (ref 78.0–100.0)
Platelets: 105 10*3/uL — ABNORMAL LOW (ref 150–400)
RBC: 3.16 MIL/uL — ABNORMAL LOW (ref 3.87–5.11)
RDW: 19.5 % — ABNORMAL HIGH (ref 11.5–15.5)
WBC: 7.1 10*3/uL (ref 4.0–10.5)

## 2011-04-03 LAB — TYPE AND SCREEN
ABO/RH(D): A POS
Antibody Screen: NEGATIVE
Unit division: 0
Unit division: 0

## 2011-04-03 LAB — BASIC METABOLIC PANEL
BUN: 10 mg/dL (ref 6–23)
CO2: 22 mEq/L (ref 19–32)
Calcium: 7.9 mg/dL — ABNORMAL LOW (ref 8.4–10.5)
Chloride: 103 mEq/L (ref 96–112)
Creatinine, Ser: 0.9 mg/dL (ref 0.50–1.10)
GFR calc Af Amer: 69 mL/min — ABNORMAL LOW (ref >90)
GFR calc non Af Amer: 59 mL/min — ABNORMAL LOW (ref >90)
Glucose, Bld: 99 mg/dL (ref 70–99)
Potassium: 4.2 mEq/L (ref 3.5–5.1)
Sodium: 133 mEq/L — ABNORMAL LOW (ref 135–145)

## 2011-04-03 MED ORDER — ENOXAPARIN (LOVENOX) PATIENT EDUCATION KIT
PACK | Freq: Once | Status: AC
  Administered 2011-04-03: 09:00:00
  Filled 2011-04-03 (×2): qty 1

## 2011-04-03 MED ORDER — ENOXAPARIN SODIUM 40 MG/0.4ML ~~LOC~~ SOLN
40.0000 mg | SUBCUTANEOUS | Status: DC
  Administered 2011-04-03 – 2011-04-04 (×2): 40 mg via SUBCUTANEOUS
  Filled 2011-04-03 (×2): qty 0.4

## 2011-04-03 MED ORDER — HYDROMORPHONE HCL 2 MG PO TABS: 2.0000 mg | ORAL_TABLET | ORAL | Status: DC | PRN

## 2011-04-03 NOTE — Progress Notes (Signed)
Patient ID: [name], female   DOB: [birth year], 78 y.o.   MRN: [id number] 2 Days Post-Op Procedure(s) (LRB): EXPLORATORY LAPAROTOMY (N/A) HYSTERECTOMY ABDOMINAL (N/A) BILATERAL SALPINGECTOMY (N/A)  Subjective: Patient reports tolerating PO, + flatus and no problems voiding.    Objective: Vital signs in last 24 hours: Temp:  [97.7 F (36.5 C)-99.6 F (37.6 C)] 98.1 F (36.7 C) (02/21 0600) Pulse Rate:  [64-97] 86  (02/21 0600) Resp:  [16-19] 18  (02/21 0600) BP: (121-170)/(74-90) 121/74 mmHg (02/21 0600) SpO2:  [94 %-100 %] 96 % (02/21 0600) Last BM Date: 04/01/11  Intake/Output from previous day: 02/20 0701 - 02/21 0700 In: 3813.7 [P.O.:920; I.V.:2486.7; Blood:400; IV Piggyback:7] Out: 3750 [Urine:3750]     Physical Examination: General: alert GI: soft, non-tender; bowel sounds normal; no masses,  no organomegaly Extremities: extremities normal, atraumatic, no cyanosis or edema I: C/D/I Labs: WBC/Hgb/Hct/Plts:  7.1/9.7/27.2/105 (02/21 7829) BUN/Cr/glu/ALT/AST/amyl/lip:  10/0.90/--/--/--/--/-- (02/21 5621)   Assessment:  78 y.o. s/p Procedure(s): EXPLORATORY LAPAROTOMY HYSTERECTOMY ABDOMINAL BILATERAL SALPINGECTOMY: stable Pain:  Pain is well-controlled on PCA.   Heme :Anemia: appropriate rise in Hgb s/p transfusion 2 units PRBC  CV: Hypertension:  controlled. Current treatment:  lisinopril (Prinivil).  GI:  Tolerating po: Yes     FEN: Na stable.  Prophylaxis: pharmacologic prophylaxis (with any of the following: enoxaparin (Lovenox) 40mg  SQ 2 hours prior to surgery then every day) and intermittent pneumatic compression boots.  Plan: Advance diet Encourage ambulation Advance to PO medication Discontinue IV fluids   LOS: 2 days    [name] A 04/03/2011, 8:44 AM

## 2011-04-04 MED ORDER — ENOXAPARIN SODIUM 40 MG/0.4ML ~~LOC~~ SOLN: 40.0000 mg | SUBCUTANEOUS | Status: DC

## 2011-04-04 MED ORDER — HYDROMORPHONE HCL 2 MG PO TABS: 2.0000 mg | ORAL_TABLET | Freq: Four times a day (QID) | ORAL | Status: AC | PRN

## 2011-04-04 NOTE — OR Nursing (Signed)
Transferred procedures to OR record from comments, Jannett Celestine, RN 04/04/11

## 2011-04-04 NOTE — OR Nursing (Deleted)
Procedures corrected, S Shacarra Choe, 04/04/11

## 2011-04-04 NOTE — Discharge Summary (Signed)
Physician Discharge Summary  Patient ID: [name] MRN: [id number] DOB/AGE: [birth year] 78 y.o.  Admit date: 04/01/2011 Discharge date: 04/04/2011  Admission Diagnoses: Active Problems:  Ovarian cancer  Anemia associated with acute blood loss  Discharge Diagnoses:  Active Problems:  Ovarian cancer  Anemia associated with acute blood loss   Discharged Condition: stable  Hospital Course: The patient underwent an interval debulking for an ovarian cancer.   Her postoperative course was complicated by anemia associated with intraoperative blood loss.  She remained hemodynamically stable.  She was transfused 2 units PRBCs.  There was an appropriate rise in the Hgb after the transfusion.  She was tolerating a regular diet on discharge.  Consults: None  Significant Diagnostic Studies: none  Treatments: surgery: see above  Discharge Exam: Blood pressure 152/80, pulse 84, temperature 98.1 F (36.7 C), temperature source Oral, resp. rate 18, height 5\' 5"  (1.651 m), weight 76.658 kg (169 lb), SpO2 97.00%. General appearance: alert GI: soft, non-tender; bowel sounds normal; no masses,  no organomegaly Extremities: extremities normal, atraumatic, no cyanosis or edema Incision/Wound:   C/D/I  Disposition:   Discharge Orders    Future Appointments: Provider: Department: Dept Phone: Center:   04/18/2011 12:15 PM Krista Blue [facility] [phone] None   04/18/2011 12:45 PM Lennis Buzzy Han, MD [facility] [phone] None   04/30/2011 1:00 PM Paola A. Duard Brady, MD [facility] [phone] None     Future Orders Please Complete By Expires   Diet - low sodium heart healthy      Increase activity slowly      May walk up steps      May shower / Bathe      Comments:   No tub baths for 6 weeks   Driving Restrictions      Comments:   No driving for 1- 2 weeks   Lifting restrictions      Comments:   No lifting > 30 lbs for 6 weeks   Sexual Activity Restrictions      Comments:   No intercourse for 6 - 8 weeks   Discharge wound care:      Comments:   Keep clean and dry   Call MD for:  temperature >100.4      Call MD for:  persistant nausea and vomiting      Call MD for:  severe uncontrolled pain      Call MD for:  redness, tenderness, or signs of infection (pain, swelling, redness, odor or green/yellow discharge around incision site)      Call MD for:  persistant dizziness or light-headedness      Call MD for:  extreme fatigue        Medication List  As of 04/04/2011 11:11 AM   TAKE these medications         acetaminophen 325 MG tablet   Commonly known as: TYLENOL   Take 650 mg by mouth every 4 (four) hours as needed. Pain        azelastine 137 MCG/SPRAY nasal spray   Commonly known as: ASTELIN   Place 1 spray into the nose 2 (two) times daily. Use in each nostril as directed      CENTRUM SILVER PO   Take 1 tablet by mouth daily.      CITRACAL + D PO   Take 2 tablets by mouth daily.      dexamethasone 4 MG tablet   Commonly known as: DECADRON   TAKE 5 TABS= 20 MG  12 HRS. AND 6 HRS. PRIOR TO CHEMOTHERAPY.  EACH DOSE WITH FOOD.      enoxaparin 40 MG/0.4ML Soln   Commonly known as: LOVENOX   Inject 0.4 mLs (40 mg total) into the skin daily.      HYDROmorphone 2 MG tablet   Commonly known as: DILAUDID   Take 1 tablet (2 mg total) by mouth every 6 (six) hours as needed for pain.      lisinopril 10 MG tablet   Commonly known as: PRINIVIL,ZESTRIL   Take 10 mg by mouth daily after breakfast.      LORazepam 0.5 MG tablet   Commonly known as: ATIVAN   TAKE 1 TABLET ORALLY OR UNDER THE TONGUE NIGHT OF CHEMOTHERAPY WHETHER  OR NOT ANY NAUSEA AND EVERY 4-6 HR. AS NEEDED FOR NAUSEA.      LOVAZA 1 G capsule   Generic drug: omega-3 acid ethyl esters   Take 1 g by mouth daily.      LUCENTIS IO   Place into the left eye once as needed. Pt receive from dr. Luciana Axe office [phone]      nadolol 20 MG tablet   Commonly known as: CORGARD    Take 10 mg by mouth daily after breakfast.      nystatin cream   Commonly known as: MYCOSTATIN   Apply 1 application topically 3 (three) times daily as needed. Itching        omeprazole 20 MG capsule   Commonly known as: PRILOSEC   Take 20 mg by mouth daily.      ondansetron 8 MG tablet   Commonly known as: ZOFRAN   TAKE 1-2 TABLETS EVERY 12-24 HRS. AS NEEDED FOR NAUSEA.  TAKE 1 TABLET IN THE MORNING AFTER CHEMOTHERAPY WHETHER OR NOT NAUSEA      SINUS RINSE BOTTLE KIT NA   Place 1 spray into the nose 2 (two) times daily as needed. Allergies        SYSTANE 0.4-0.3 % Soln   Generic drug: Polyethyl Glycol-Propyl Glycol   Place 1 drop into both eyes 4 (four) times daily.      Vitamin D 2000 UNITS tablet   Take 2,000 Units by mouth daily.      vitamin E 400 UNIT capsule   Generic drug: vitamin E   Take 400 Units by mouth daily.      zolpidem 10 MG tablet   Commonly known as: AMBIEN   Take 10 mg by mouth at bedtime.           Follow-up Information    Follow up on 04/09/2011. (8:30 am [facility])          Signed: Roseanna Rainbow 04/04/2011, 11:11 AM

## 2011-04-04 NOTE — Progress Notes (Signed)
Assessment unchanged. Verbalized understanding of dc instructions. Pt demonstrated understanding of Lovenox injection with good aseptic technique. Script x1 given to pt and MD electronically sent Lovenox script to pt's pharmacy. Lovenox education kit with pt for home. Pt dc'd via wheelchair to front entrance to meet awaiting vehicle to carry home. Accompanied by husband and nurse tech.

## 2011-04-07 ENCOUNTER — Telehealth: Payer: Self-pay | Admitting: Gynecologic Oncology

## 2011-04-07 ENCOUNTER — Telehealth: Payer: Self-pay | Admitting: *Deleted

## 2011-04-07 NOTE — Telephone Encounter (Signed)
Patient notified of Path results.  F/U appt Wed 04/09/11

## 2011-04-07 NOTE — Telephone Encounter (Signed)
Post op telephone call to check patient status.  Patient describes expected post operative status.  Adequate PO intake reported.  Bowels and bladder functioning without difficulty at this time.  Reporting mild constipation since discharge with results this am after taking Miralax.  Pain minimal.  Reportable signs and symptoms reviewed.  Plans for staples removal on Wed 04/09/11 and follow up appointment made for 04/30/11 at 13:00.  Instructed to call for any questions or concerns.

## 2011-04-08 ENCOUNTER — Telehealth: Payer: Self-pay | Admitting: Gynecologic Oncology

## 2011-04-08 NOTE — Telephone Encounter (Signed)
Spoke with patient about concerns left on an earlier message.  Pt stating that she has been having diarrhea yesterday and today after using stool softeners and Miralax several days before.  Pt denies abd pain, nausea, vomiting, redness/drainage from incision.  Instructed to maintain adequate PO intake and to avoid bowel stimulants/softeners until the diarrhea resolves.  Instructed to arrive to the clinic tomorrow morning for staple removal.  Instructed to call the office if symptoms worsen or for any concerns.

## 2011-04-09 ENCOUNTER — Ambulatory Visit: Payer: Medicare Other | Admitting: Lab

## 2011-04-09 ENCOUNTER — Other Ambulatory Visit: Payer: Self-pay | Admitting: Gynecologic Oncology

## 2011-04-09 ENCOUNTER — Other Ambulatory Visit: Payer: Self-pay | Admitting: *Deleted

## 2011-04-09 ENCOUNTER — Encounter: Payer: Self-pay | Admitting: *Deleted

## 2011-04-09 ENCOUNTER — Telehealth: Payer: Self-pay | Admitting: *Deleted

## 2011-04-09 DIAGNOSIS — R197 Diarrhea, unspecified: Secondary | ICD-10-CM

## 2011-04-09 DIAGNOSIS — C569 Malignant neoplasm of unspecified ovary: Secondary | ICD-10-CM

## 2011-04-09 LAB — CBC WITH DIFFERENTIAL/PLATELET
BASO%: 0.4 % (ref 0.0–2.0)
Basophils Absolute: 0 10*3/uL (ref 0.0–0.1)
EOS%: 4.9 % (ref 0.0–7.0)
Eosinophils Absolute: 0.4 10*3/uL (ref 0.0–0.5)
HCT: 30 % — ABNORMAL LOW (ref 34.8–46.6)
HGB: 10.3 g/dL — ABNORMAL LOW (ref 11.6–15.9)
LYMPH%: 14.9 % (ref 14.0–49.7)
MCH: 30.1 pg (ref 25.1–34.0)
MCHC: 34.3 g/dL (ref 31.5–36.0)
MCV: 87.7 fL (ref 79.5–101.0)
MONO#: 0.9 10*3/uL (ref 0.1–0.9)
MONO%: 12.2 % (ref 0.0–14.0)
NEUT#: 5.2 10*3/uL (ref 1.5–6.5)
NEUT%: 67.6 % (ref 38.4–76.8)
Platelets: 229 10*3/uL (ref 145–400)
RBC: 3.42 10*6/uL — ABNORMAL LOW (ref 3.70–5.45)
RDW: 18.6 % — ABNORMAL HIGH (ref 11.2–14.5)
WBC: 7.7 10*3/uL (ref 3.9–10.3)
lymph#: 1.2 10*3/uL (ref 0.9–3.3)
nRBC: 0 % (ref 0–0)

## 2011-04-09 LAB — CLOSTRIDIUM DIFFICILE EIA

## 2011-04-09 LAB — BASIC METABOLIC PANEL
BUN: 15 mg/dL (ref 6–23)
CO2: 23 mEq/L (ref 19–32)
Calcium: 8.6 mg/dL (ref 8.4–10.5)
Chloride: 99 mEq/L (ref 96–112)
Creatinine, Ser: 0.89 mg/dL (ref 0.50–1.10)
Glucose, Bld: 119 mg/dL — ABNORMAL HIGH (ref 70–99)
Potassium: 3.9 mEq/L (ref 3.5–5.3)
Sodium: 132 mEq/L — ABNORMAL LOW (ref 135–145)

## 2011-04-09 NOTE — Telephone Encounter (Signed)
Spoke with patient about CBC results.  No concerns or questions voiced.  Instructed that she will be notified when the metabolic panel and stool specimen for c. diff results are available.

## 2011-04-09 NOTE — Progress Notes (Signed)
Pt in with c/o diarrhea and to have staples removed.  Post-operative wound check. Incision clean, dry and intact. Staples removed and Steri-strips applied.  Regarding diarrhea, pt sent for c-diff stool sample and lab work following discussion with Dr Duard Brady.  Advised to gently use immodium for diarrhea which she states is less than it has been. F/u appt with Dr Duard Brady unless she needs to be seen sooner.  Further patient feels the diarrhea is from the lovenox and she wishes to discontinue.. Advised to hold today and tomorrow and we will reevaluate.  She is up and about at home and states she will be up every few hours and walk around the house.  We will notify her of the lab results.   Peggyann Juba

## 2011-04-10 ENCOUNTER — Telehealth: Payer: Self-pay | Admitting: Gynecologic Oncology

## 2011-04-10 NOTE — Telephone Encounter (Signed)
Spoke with patient about lab results: BMET and c diff.  Pt wanting to see if labs prior to appt with Dr Darrold Span on 3/8 were still necessary since labs were just checked.  Dr Darrold Span off today so returned call and spoke with husband that we would update her if labs were needed.

## 2011-04-14 ENCOUNTER — Encounter (HOSPITAL_COMMUNITY): Payer: Self-pay | Admitting: Gynecology

## 2011-04-18 ENCOUNTER — Ambulatory Visit (HOSPITAL_BASED_OUTPATIENT_CLINIC_OR_DEPARTMENT_OTHER): Payer: Medicare Other | Admitting: Oncology

## 2011-04-18 ENCOUNTER — Other Ambulatory Visit (HOSPITAL_BASED_OUTPATIENT_CLINIC_OR_DEPARTMENT_OTHER): Payer: Medicare Other | Admitting: Lab

## 2011-04-18 ENCOUNTER — Other Ambulatory Visit: Payer: Self-pay

## 2011-04-18 ENCOUNTER — Encounter: Payer: Self-pay | Admitting: Oncology

## 2011-04-18 VITALS — BP 158/82 | HR 80 | Temp 98.1°F | Ht 65.0 in | Wt 168.8 lb

## 2011-04-18 DIAGNOSIS — C569 Malignant neoplasm of unspecified ovary: Secondary | ICD-10-CM

## 2011-04-18 DIAGNOSIS — D649 Anemia, unspecified: Secondary | ICD-10-CM

## 2011-04-18 DIAGNOSIS — E871 Hypo-osmolality and hyponatremia: Secondary | ICD-10-CM

## 2011-04-18 LAB — CBC WITH DIFFERENTIAL/PLATELET
BASO%: 0.1 % (ref 0.0–2.0)
Basophils Absolute: 0 10*3/uL (ref 0.0–0.1)
EOS%: 20.6 % — ABNORMAL HIGH (ref 0.0–7.0)
Eosinophils Absolute: 1.5 10*3/uL — ABNORMAL HIGH (ref 0.0–0.5)
HCT: 32.4 % — ABNORMAL LOW (ref 34.8–46.6)
HGB: 10.8 g/dL — ABNORMAL LOW (ref 11.6–15.9)
LYMPH%: 17.1 % (ref 14.0–49.7)
MCH: 31.1 pg (ref 25.1–34.0)
MCHC: 33.2 g/dL (ref 31.5–36.0)
MCV: 93.5 fL (ref 79.5–101.0)
MONO#: 0.3 10*3/uL (ref 0.1–0.9)
MONO%: 3.8 % (ref 0.0–14.0)
NEUT#: 4.3 10*3/uL (ref 1.5–6.5)
NEUT%: 58.4 % (ref 38.4–76.8)
Platelets: 336 10*3/uL (ref 145–400)
RBC: 3.47 10*6/uL — ABNORMAL LOW (ref 3.70–5.45)
RDW: 19.6 % — ABNORMAL HIGH (ref 11.2–14.5)
WBC: 7.4 10*3/uL (ref 3.9–10.3)
lymph#: 1.3 10*3/uL (ref 0.9–3.3)

## 2011-04-18 LAB — COMPREHENSIVE METABOLIC PANEL
ALT: 9 U/L (ref 0–35)
AST: 16 U/L (ref 0–37)
Albumin: 3.6 g/dL (ref 3.5–5.2)
Alkaline Phosphatase: 74 U/L (ref 39–117)
BUN: 16 mg/dL (ref 6–23)
CO2: 24 mEq/L (ref 19–32)
Calcium: 9.3 mg/dL (ref 8.4–10.5)
Chloride: 102 mEq/L (ref 96–112)
Creatinine, Ser: 0.96 mg/dL (ref 0.50–1.10)
Glucose, Bld: 93 mg/dL (ref 70–99)
Potassium: 4.9 mEq/L (ref 3.5–5.3)
Sodium: 134 mEq/L — ABNORMAL LOW (ref 135–145)
Total Bilirubin: 0.3 mg/dL (ref 0.3–1.2)
Total Protein: 6.7 g/dL (ref 6.0–8.3)

## 2011-04-18 MED ORDER — DEXAMETHASONE 4 MG PO TABS: ORAL_TABLET | ORAL | Status: DC

## 2011-04-18 NOTE — Patient Instructions (Signed)
Call gyn onc next week if vaginal bleeding has not improved

## 2011-04-18 NOTE — Progress Notes (Signed)
OFFICE PROGRESS NOTE Date of Visit 04-18-2011 Physicians P.Duard Brady, D.ClarkePearson, K.Little, R.Taavon,J.Hayes, G.Rankin  INTERVAL HISTORY:  Patient is seen, together with husband, now having had 3 cycles of neoadjuvant taxol/carboplatin thru 03-10-11 followed by surgery 2-19 2013 by Dr.ClarkePearson for primary gyn carcinoma. She actually had pathologic CR at surgery. Surgery was exploratory laparotomy with TAH/BSO and omentectomy, without lymph node evaluation. There were extensive adhesions and the operative note describes oozing of ascites fluid from all peritoneal surfaces during procedure. Her postoperative course was significant for hgb down to 7.6 post op, with transfusion then of 2 units of PRBCs.  Pathology [phone]) from 04-01-2011 showed no residual carcinoma in omentum, ovaries, tubes, uterus tho scattered psammoma bodies were noted thruout as well as extensive serosal adhesions, fibrosis, necrosis and changes of chronic inflammation. Patient is to see Dr. Duard Brady on 04-30-2011. Dr.Gehrig and I are both in favor of continuing up to an additional 3 cycles of chemotherapy. Patient is aware of this recommendation and is in agreement. Patient has been recovering generally well from surgery, tho she has had vaginal spotting for past several days which is brownish and minimal on small pads. She had diarrhea after DC home and preferred to stop lovenox then (tho lovenox would not be expected to cause diarrhea, as we discussed again now). She has been active and ambulatory at home and has had no swelling or pain in LE. C.diff was negative 04-09-2011 and she has had no further diarrhea. The surgical incision is healing well. She still has some abdominal distension. Appetite is good and she denies fever or respiratory symptoms. Remainder of full 10 point Review of Systems is negative/ unchanged.  Objective:  Vital signs in last 24 hours:  BP 158/82  Pulse 80  Temp(Src) 98.1 F (36.7 C) (Oral)  Ht 5\' 5"   (1.651 m)  Wt 168 lb 12.8 oz (76.567 kg)  BMI 28.09 kg/m2  Alert, ambulatory without assistance, talkative and taking notes.  HEENT:mucous membranes moist, pharynx normal without lesions. PERRL. Alopecia LymphaticsCervical, supraclavicular, and axillary nodes normal.No inguinal adenopathy Resp: clear to auscultation bilaterally and normal percussion bilaterally Cardio: regular rate and rhythm GI: soft, some normal bowel sounds, midline incision closed without erythema or tenderness. Some fullness in central and lower abdomen to left of midline, not tender Extremities: extremities normal, atraumatic, no cyanosis or edema Neuro:no sensory deficits noted Skin with 3 ecchymotic areas bilateral lower abdomen at sites of lovenox. No rash, no petechiae.   Lab Results:   [facility] 04/18/11 1207  WBC 7.4  HGB 10.8*  HCT 32.4*  PLT 336  ANC 4.3     RDW 19.6  BMET  Basename 04/18/11 1207  NA 134*  K 4.9  CL 102  CO2 24  GLUCOSE 93  BUN 16  CREATININE 0.96  CALCIUM 9.3   Remainder of full CMET normal. This sodium is improved from 128 post op and 132 a week ago. Studies/Results:none additional  Medications: I have reviewed the patient's current medications.  Assessment/Plan:  1. Primary gyn carcinoma: pathologic CR after only 3 cycles of neoadjuvant chemotherapy. Will plan to resume same chemotherapy probably for additional 3 cycles after she sees Dr.Gehrig on March 20; because of Easter holiday, next treatment will be April 1 as ;pmg as ANC >=1.5 and plt >=100k, with neupogen x 4 doses beginning April 2. I will see her back at least 4-9 with cbc/cmet/ ca125 then, or sooner if needed. I have asked her to call gyn onc if vaginal drainage persists by  next week or if this worsens. 2.multifactorial anemia: hgb improved over post transfusion level in hospital. Continuing OTC oral iron one daily 3.hyponatremia with previous surgery and on presentation with gyn  cancer.        Hassani Sliney P, MD   04/18/2011, 9:20 PM

## 2011-04-21 ENCOUNTER — Telehealth: Payer: Self-pay

## 2011-04-21 ENCOUNTER — Ambulatory Visit (HOSPITAL_BASED_OUTPATIENT_CLINIC_OR_DEPARTMENT_OTHER): Payer: Medicare Other

## 2011-04-21 ENCOUNTER — Other Ambulatory Visit: Payer: Self-pay

## 2011-04-21 DIAGNOSIS — R350 Frequency of micturition: Secondary | ICD-10-CM

## 2011-04-21 DIAGNOSIS — N390 Urinary tract infection, site not specified: Secondary | ICD-10-CM

## 2011-04-21 LAB — URINALYSIS, MICROSCOPIC - CHCC
Bilirubin (Urine): NEGATIVE
Glucose: NEGATIVE g/dL
Ketones: NEGATIVE mg/dL
Nitrite: NEGATIVE
Protein: NEGATIVE mg/dL
Specific Gravity, Urine: 1.01 (ref 1.003–1.035)
pH: 6.5 (ref 4.6–8.0)

## 2011-04-21 MED ORDER — CIPROFLOXACIN HCL 250 MG PO TABS: 250.0000 mg | ORAL_TABLET | Freq: Two times a day (BID) | ORAL | Status: AC

## 2011-04-21 NOTE — Telephone Encounter (Signed)
[name] CALLED AND STATED THAT SHE HAS BEEN EXPERIENCING URINARY FREQUENCY, DISCOMFORT, AND CHILLING SENSATION WHEN SHE VOIDS.  REVIEWED WITH [name].  SHE WILL COME IN FOR UA C&S AT 1330 TODAY.

## 2011-04-21 NOTE — Progress Notes (Signed)
SPOKE WITH MS. Ingalsbe AND TOLD HER THAT UA TODAY  SHOWS A POSSIBILITY OF AN INFECTION. ADRENA JOHNSON PA-C ORDERED CIPRO 250 MG BID X 7 DAYS.  SENT ELECTRONICALLY TO [facility].  NEEDS TO LET PT. KNOW CULTURE RESULTS WHEN AVAILABLE.

## 2011-04-22 LAB — URINE CULTURE

## 2011-04-23 ENCOUNTER — Telehealth: Payer: Self-pay

## 2011-04-23 ENCOUNTER — Telehealth: Payer: Self-pay | Admitting: Gynecologic Oncology

## 2011-04-23 NOTE — Telephone Encounter (Signed)
DR. Fredirick Maudlin OFFICE CALLED AND LEFT MESSAGE ~1515 TO RETURN CALL ABOUT [name] AND REACTION TO CIPRO.  GAVE MESSAGE TO MELISSA NP IN GYN ONC OFFICE WHO SPOKE WITH PT. EARLIER TODAY.  TOLD MS. Gloeckner THAT I WAS JUST LETTING HER KNOW THAT I RECEIVED HER MESSAGE AND SPOKE WITH GYN ONC AND WAS TOLD THAT AN APPT. WAS SET UP WITH DR. Duard Brady TO FOLLOW UP SYMPTOMS FROM CIPRO AND URINARY IRRITATION.

## 2011-04-23 NOTE — Telephone Encounter (Signed)
Called to check with patient about urinary symptoms.  Reporting Cipro use with some improvement of symptoms.  Patient informed of urine culture results: no growth.  Instructed to contact the office for no improvement or worsening symptoms.  No questions or concerns voiced.

## 2011-04-23 NOTE — Telephone Encounter (Signed)
Received a phone call from [facility] in Dr. Precious Reel office about a message left at [facility] pertaining to [employer].  Spoke with Noreene Larsson at [employer] about a phone call from [employer].  The patient called [facility] with complaints about "having a reaction to Cipro."  Pt had been contacted earlier today with no complaints reported.  Office staff member, Noreene Larsson, reporting that the patient reported nausea and "not feeling well all day."  Pt contacted by Telford Nab, RN.  Pt reporting that she "went to [employer] for lunch, ate soup, and became hot."  Called to speak with patient about coming to see Dr. Duard Brady on 04/24/11 to address her concerns. Pt agreeable.

## 2011-04-24 ENCOUNTER — Ambulatory Visit: Payer: Medicare Other | Attending: Gynecologic Oncology | Admitting: Gynecologic Oncology

## 2011-04-24 ENCOUNTER — Encounter: Payer: Self-pay | Admitting: Gynecologic Oncology

## 2011-04-24 VITALS — BP 128/68 | HR 82 | Temp 98.2°F | Resp 18 | Ht 63.94 in | Wt 163.0 lb

## 2011-04-24 DIAGNOSIS — M1710 Unilateral primary osteoarthritis, unspecified knee: Secondary | ICD-10-CM | POA: Diagnosis not present

## 2011-04-24 DIAGNOSIS — I10 Essential (primary) hypertension: Secondary | ICD-10-CM | POA: Insufficient documentation

## 2011-04-24 DIAGNOSIS — Z90710 Acquired absence of both cervix and uterus: Secondary | ICD-10-CM | POA: Diagnosis not present

## 2011-04-24 DIAGNOSIS — M899 Disorder of bone, unspecified: Secondary | ICD-10-CM | POA: Insufficient documentation

## 2011-04-24 DIAGNOSIS — M949 Disorder of cartilage, unspecified: Secondary | ICD-10-CM | POA: Insufficient documentation

## 2011-04-24 DIAGNOSIS — C569 Malignant neoplasm of unspecified ovary: Secondary | ICD-10-CM | POA: Diagnosis not present

## 2011-04-24 DIAGNOSIS — K219 Gastro-esophageal reflux disease without esophagitis: Secondary | ICD-10-CM | POA: Insufficient documentation

## 2011-04-24 DIAGNOSIS — Z880 Allergy status to penicillin: Secondary | ICD-10-CM | POA: Diagnosis not present

## 2011-04-24 DIAGNOSIS — Z9221 Personal history of antineoplastic chemotherapy: Secondary | ICD-10-CM | POA: Diagnosis not present

## 2011-04-24 NOTE — Progress Notes (Signed)
Consult Note: Gyn-Onc  Rainey Pines 78 y.o. female  CC:  Chief Complaint  Patient presents with  . Ovarian Cancer    Follow up    HPI:HPI: Patient is a 78 year old with palpable stage IIIc primary peritoneal or ovarian carcinoma. Her illness began with several weeks of diarrhea and abdominal bloating the fall of 2012. She was tested several times for C. difficile was negative. Dr. Billy Coast at that time recommended an ultrasound and ultimately a CT scan. Ultrasound showed significant ascites and her CA 125 was markedly elevated which is when we saw her. CT imaging was most consistent with markedly advanced disease that would most likely preclude optimal debulking. This in conjunction with her poor performance status the decision was made to proceed with neoadjuvant chemotherapy. She had a high-volume paracentesis to confirm the diagnosis of a gynecologic malignancy.   She has undergone 3 cycles of neoadjuvant chemotherapy under the care of Dr. Darrold Span with paclitaxel and carboplatin. Her CA 125 prior cycle #1 was 204 and most recently on January 18 prior to cycle #2 her CA 125 was 12.6. She also had a CT scan last week that showed marked resolution of her ascites and marked improvement in the omental caking. There is no mention of any significant carcinomatosis. There is no significant adenopathy. The adnexa and uterus are within normal limits.  After discussion of pros and cons she opted for definitive surgical management. On 04/01/2011 she underwent surgery by Dr. Loree Fee. She underwent exploratory laparotomy TAH/BSO and omental resection. Is no evidence of disease. Pathology revealed no residual carcinoma within the omentum. There was stromal fibrosis and chronic inflammation and scattered psammoma bodies consistent with posttreatment effect. Within the right ovary and tube was no residual carcinoma. There was benign ovarian tissue with associated serosal adhesions necrosis and scattered  psammoma bodies. This was seen on the uterine serosa. The uterus was otherwise negative. The left tube and ovary also had associated psammoma bodies but no residual carcinoma identified. She did well postoperatively. She comes in today for a postoperative check in for discussion of further treatment planning. Please refer to all the telephone notes in the system for her interval history.   Interval History: She has not required any pain medication. She does complain of occasional twinge type pain. She has had a small amount of vaginal bleeding. She's had no return of bowel and bladder function. She was treated for a presumptive UTI though her urine culture was never obtained. The symptoms have resolved. Her energy level is picking up. While she understands our recommendations for an additional 3 cycles of chemotherapy, she of course wishes she did not to proceed with any treatment. We had a lengthy discussion regarding the rationale for this and she does understand.  Review of Systems  Current Meds:  Outpatient Encounter Prescriptions as of 04/24/2011  Medication Sig Dispense Refill  . acetaminophen (TYLENOL) 325 MG tablet Take 650 mg by mouth every 4 (four) hours as needed. Pain       . azelastine (ASTELIN) 137 MCG/SPRAY nasal spray Place 1 spray into the nose 2 (two) times daily. Use in each nostril as directed      . Calcium Citrate-Vitamin D (CITRACAL + D PO) Take 2 tablets by mouth daily.      . Cholecalciferol (VITAMIN D) 2000 UNITS tablet Take 2,000 Units by mouth daily.       . ciprofloxacin (CIPRO) 250 MG tablet Take 1 tablet (250 mg total) by mouth 2 (two)  times daily.  14 tablet  0  . dexamethasone (DECADRON) 4 MG tablet TAKE 5 TABS= 20 MG 12 HRS. AND 6 HRS. PRIOR TO CHEMOTHERAPY.  EACH DOSE WITH FOOD.  30 tablet  0  . HYDROmorphone (DILAUDID) 2 MG tablet Take 2 mg by mouth Every 6 hours as needed. FOR PAIN      . Hypertonic Nasal Wash (SINUS RINSE BOTTLE KIT NA) Place 1 spray into the  nose 2 (two) times daily as needed. Allergies       . lisinopril (PRINIVIL,ZESTRIL) 10 MG tablet Take 10 mg by mouth daily after breakfast.       . LORazepam (ATIVAN) 0.5 MG tablet TAKE 1 TABLET ORALLY OR UNDER THE TONGUE NIGHT OF CHEMOTHERAPY WHETHER  OR NOT ANY NAUSEA AND EVERY 4-6 HR. AS NEEDED FOR NAUSEA.  20 tablet  0  . Multiple Vitamins-Minerals (CENTRUM SILVER PO) Take 1 tablet by mouth daily.       . nadolol (CORGARD) 20 MG tablet Take 10 mg by mouth daily after breakfast.       . nystatin cream (MYCOSTATIN) Apply 1 application topically 3 (three) times daily as needed. Itching       . omega-3 acid ethyl esters (LOVAZA) 1 G capsule Take 1 g by mouth daily.      Marland Kitchen omeprazole (PRILOSEC) 20 MG capsule Take 20 mg by mouth daily.      . ondansetron (ZOFRAN) 8 MG tablet TAKE 1-2 TABLETS EVERY 12-24 HRS. AS NEEDED FOR NAUSEA.  TAKE 1 TABLET IN THE MORNING AFTER CHEMOTHERAPY WHETHER OR NOT NAUSEA  20 tablet  1  . Polyethyl Glycol-Propyl Glycol (SYSTANE) 0.4-0.3 % SOLN Place 1 drop into both eyes 4 (four) times daily.      . Ranibizumab (LUCENTIS IO) Place into the left eye once as needed. Pt receive from dr. Luciana Axe office [phone]      . vitamin E (VITAMIN E) 400 UNIT capsule Take 400 Units by mouth daily.      Marland Kitchen zolpidem (AMBIEN) 10 MG tablet Take 10 mg by mouth at bedtime.        Facility-Administered Encounter Medications as of 04/24/2011  Medication Dose Route Frequency Provider Last Rate Last Dose  . CARBOplatin (PARAPLATIN) 430 mg in sodium chloride 0.9 % 250 mL chemo infusion  430 mg Intravenous Once Lennis P Livesay, MD      . PACLitaxel (TAXOL) 264 mg in dextrose 5 % 250 mL chemo infusion (> 80mg /m2)  135 mg/m2 (Treatment Plan Actual) Intravenous Once Lennis Buzzy Han, MD        Allergy:  Allergies  Allergen Reactions  . Iodine     REACTION: hives  . Morphine And Related     Given after knee replacement and code blue occurred  . Penicillins     REACTION: hives  .  Shellfish Allergy     Pt has shellfish allergy only.  Has had IV contrast x 2 and did fine.    Social Hx:   History   Social History  . Marital Status: Married    Spouse Name: N/A    Number of Children: N/A  . Years of Education: N/A   Occupational History  . Not on file.   Social History Main Topics  . Smoking status: Former Smoker    Quit date: 02/11/1952  . Smokeless tobacco: Never Used  . Alcohol Use: No  . Drug Use: No  . Sexually Active: No   Other Topics Concern  . Not  on file   Social History Narrative  . No narrative on file    Past Surgical Hx:  Past Surgical History  Procedure Date  . Tonsillectomy   . Appendectomy   . Joint replacement     R knee in 2008, 5 operations on L knee  . Other surgical history     hx of C section 1966  . Laparotomy 04/01/2011    Procedure: EXPLORATORY LAPAROTOMY;  Surgeon: Jeannette Corpus, MD;  Location: [facility];  Service: Gynecology;  Laterality: N/A;  . Abdominal hysterectomy 04/01/2011    Procedure: HYSTERECTOMY ABDOMINAL;  Surgeon: Jeannette Corpus, MD;  Location: [facility];  Service: Gynecology;  Laterality: N/A;  . Salpingoophorectomy 04/01/2011    Procedure: SALPINGO OOPHERECTOMY;  Surgeon: Jeannette Corpus, MD;  Location: [facility];  Service: Gynecology;  Laterality: Bilateral;    Past Medical Hx:  Past Medical History  Diagnosis Date  . Ascites   . Cystitis   . GERD (gastroesophageal reflux disease)   . Hypertension   . Hyponatremia   . Hyperlipidemia   . Osteopenia   . Postmenopausal atrophic vaginitis   . Vulvitis   . Staph infection 2010    after knee replacement  . Urinary frequency   . Ovarian cancer 01/25/2011  . Complication of anesthesia     Sodium drops per pt   . Pneumonia     hx of   . Anemia     hx of   . Blood transfusion     hx of 2011   . H/O hiatal hernia   . Arthritis     knees     Family Hx:  Family History  Problem Relation Age of Onset  . Heart attack Brother    . Diabetes Brother   . Cancer Other     Bladder cancer    Vitals:  Blood pressure 128/68, pulse 82, temperature 98.2 F (36.8 C), temperature source Oral, resp. rate 18, height 5' 3.94" (1.624 m), weight 163 lb (73.936 kg).  Physical Exam:  Well-nourished well-developed female in no acute distress.  Lungs: Clear to auscultation by Lonnie.  Cardiovascular regular rate and rhythm.  Abdomen: Well-healed surgical incision. There is no palpable hernia. Abdomen is soft and nontender.  Extremities: No edema.  Pelvic: External genitalia within normal limits. Speculum examination reveals a vaginal cuff is intact. Bimanual examination reveals postoperative changes. Assessment/Plan: 25 with optimally debulked stage IIIc ovarian cancer status post neoadjuvant therapy. She has a complete pathologic response based on her surgery from February 19. I discussed with my recommendation would be for an additional 3 cycles of chemotherapy. She understands this rationale. She will followup Dr. Darrold Span.  Rutilio Yellowhair A., MD 04/24/2011, 1:04 PM

## 2011-04-24 NOTE — Patient Instructions (Signed)
Followup of chemotherapy with Dr. Darrold Span as scheduled

## 2011-04-30 ENCOUNTER — Ambulatory Visit: Payer: Medicare Other | Admitting: Gynecologic Oncology

## 2011-05-07 DIAGNOSIS — H35049 Retinal micro-aneurysms, unspecified, unspecified eye: Secondary | ICD-10-CM | POA: Diagnosis not present

## 2011-05-07 DIAGNOSIS — H348392 Tributary (branch) retinal vein occlusion, unspecified eye, stable: Secondary | ICD-10-CM | POA: Diagnosis not present

## 2011-05-08 ENCOUNTER — Telehealth: Payer: Self-pay

## 2011-05-08 NOTE — Telephone Encounter (Signed)
PT. WANTED DR. Darrold Span TO KNOW THAT SHE IS STILL EXPERIENCING VAGINAL BLEEDING SINCE SURGERY .  SHE CHANGES A SANITARY  PAD 1X DAILY.   SHE IS TO HAVE CHEMOTHERAPY Monday 05-12-11. SHE LEFT THIS MESSAGE YESTERDAY AT 1653 AND WANTED TO DISCUSS THIS .  TRIED TO REACH HER TODAY BUT LEFT A MESSAGE STATING THAT SHE COULD LEAVE A DETAILED MESSAGE AS TO HER CONCERNS FOR DISCUSSION.  SHE LEFT ANOTHER MESSAGE STATING THAT THE MESSAGE SHE LEFT YESTERDAY STATED HER CONCERNS. CALLED PT. BACK AND LEFT ANOTHER MESSAGE STATING THAT DR. Darrold Span IS AWARE OF BLEEDING BUT THIS WOULD NOT INTERFERE WITH HER RECEIVING CHEMOTHERAPY ON Monday 05-12-11 AS SCHEDULED.  TOLD HER THAT NANCY WILKERSON RN WITH GYN ONC. STATED THAT SHE COULD EXPERIENCE VAGINAL BLEEDING FOR 6-8 WEEKS POST OP.  SURGERY  WAS April 01, 2011.

## 2011-05-09 ENCOUNTER — Other Ambulatory Visit: Payer: Self-pay | Admitting: Oncology

## 2011-05-09 ENCOUNTER — Telehealth: Payer: Self-pay | Admitting: *Deleted

## 2011-05-09 NOTE — Telephone Encounter (Signed)
msg left for patient to reinforce advice given by Sallye Ober yesterday regarding vaginal spotting.  I restated that this is normal for 6 - 8 weekd after surgery and the amount of her spotting is normal.  She is encouraged not to worry and proceed with chemo as scheduled.

## 2011-05-12 ENCOUNTER — Ambulatory Visit (HOSPITAL_BASED_OUTPATIENT_CLINIC_OR_DEPARTMENT_OTHER): Payer: Medicare Other

## 2011-05-12 ENCOUNTER — Other Ambulatory Visit: Payer: Medicare Other | Admitting: Lab

## 2011-05-12 VITALS — BP 144/81 | HR 81 | Temp 97.4°F

## 2011-05-12 DIAGNOSIS — C569 Malignant neoplasm of unspecified ovary: Secondary | ICD-10-CM

## 2011-05-12 DIAGNOSIS — Z5111 Encounter for antineoplastic chemotherapy: Secondary | ICD-10-CM

## 2011-05-12 LAB — CBC WITH DIFFERENTIAL/PLATELET
BASO%: 0.1 % (ref 0.0–2.0)
Basophils Absolute: 0 10*3/uL (ref 0.0–0.1)
EOS%: 0 % (ref 0.0–7.0)
Eosinophils Absolute: 0 10*3/uL (ref 0.0–0.5)
HCT: 35 % (ref 34.8–46.6)
HGB: 11.8 g/dL (ref 11.6–15.9)
LYMPH%: 15.1 % (ref 14.0–49.7)
MCH: 31.8 pg (ref 25.1–34.0)
MCHC: 33.7 g/dL (ref 31.5–36.0)
MCV: 94.3 fL (ref 79.5–101.0)
MONO#: 0 10*3/uL — ABNORMAL LOW (ref 0.1–0.9)
MONO%: 0.8 % (ref 0.0–14.0)
NEUT#: 3.6 10*3/uL (ref 1.5–6.5)
NEUT%: 84 % — ABNORMAL HIGH (ref 38.4–76.8)
Platelets: 191 10*3/uL (ref 145–400)
RBC: 3.71 10*6/uL (ref 3.70–5.45)
RDW: 16.5 % — ABNORMAL HIGH (ref 11.2–14.5)
WBC: 4.3 10*3/uL (ref 3.9–10.3)
lymph#: 0.7 10*3/uL — ABNORMAL LOW (ref 0.9–3.3)
nRBC: 0 % (ref 0–0)

## 2011-05-12 MED ORDER — DEXAMETHASONE SODIUM PHOSPHATE 4 MG/ML IJ SOLN
20.0000 mg | Freq: Once | INTRAMUSCULAR | Status: AC
  Administered 2011-05-12: 20 mg via INTRAVENOUS

## 2011-05-12 MED ORDER — PACLITAXEL CHEMO INJECTION 300 MG/50ML
135.0000 mg/m2 | Freq: Once | INTRAVENOUS | Status: AC
  Administered 2011-05-12: 264 mg via INTRAVENOUS
  Filled 2011-05-12: qty 44

## 2011-05-12 MED ORDER — DIPHENHYDRAMINE HCL 50 MG/ML IJ SOLN
50.0000 mg | Freq: Once | INTRAMUSCULAR | Status: AC
  Administered 2011-05-12: 50 mg via INTRAVENOUS

## 2011-05-12 MED ORDER — SODIUM CHLORIDE 0.9 % IJ SOLN
10.0000 mL | INTRAMUSCULAR | Status: DC | PRN
  Filled 2011-05-12: qty 10

## 2011-05-12 MED ORDER — SODIUM CHLORIDE 0.9 % IV SOLN
400.0000 mg | Freq: Once | INTRAVENOUS | Status: AC
  Administered 2011-05-12: 400 mg via INTRAVENOUS
  Filled 2011-05-12: qty 40

## 2011-05-12 MED ORDER — HEPARIN SOD (PORK) LOCK FLUSH 100 UNIT/ML IV SOLN
500.0000 [IU] | Freq: Once | INTRAVENOUS | Status: DC | PRN
  Filled 2011-05-12: qty 5

## 2011-05-12 MED ORDER — ONDANSETRON 16 MG/50ML IVPB (CHCC)
16.0000 mg | Freq: Once | INTRAVENOUS | Status: AC
  Administered 2011-05-12: 16 mg via INTRAVENOUS

## 2011-05-12 MED ORDER — FAMOTIDINE IN NACL 20-0.9 MG/50ML-% IV SOLN
20.0000 mg | Freq: Once | INTRAVENOUS | Status: AC
  Administered 2011-05-12: 20 mg via INTRAVENOUS

## 2011-05-12 MED ORDER — SODIUM CHLORIDE 0.9 % IV SOLN
Freq: Once | INTRAVENOUS | Status: AC
  Administered 2011-05-12: 10:00:00 via INTRAVENOUS

## 2011-05-13 ENCOUNTER — Ambulatory Visit (HOSPITAL_BASED_OUTPATIENT_CLINIC_OR_DEPARTMENT_OTHER): Payer: Medicare Other

## 2011-05-13 VITALS — BP 161/78 | HR 73 | Temp 97.0°F

## 2011-05-13 DIAGNOSIS — T451X5A Adverse effect of antineoplastic and immunosuppressive drugs, initial encounter: Secondary | ICD-10-CM | POA: Diagnosis not present

## 2011-05-13 DIAGNOSIS — C569 Malignant neoplasm of unspecified ovary: Secondary | ICD-10-CM

## 2011-05-13 DIAGNOSIS — D6481 Anemia due to antineoplastic chemotherapy: Secondary | ICD-10-CM

## 2011-05-13 MED ORDER — FILGRASTIM 300 MCG/0.5ML IJ SOLN
300.0000 ug | Freq: Once | INTRAMUSCULAR | Status: AC
  Administered 2011-05-13: 300 ug via SUBCUTANEOUS
  Filled 2011-05-13: qty 0.5

## 2011-05-13 NOTE — Patient Instructions (Signed)
Pt in for injection.  No complaints other than a red face from the steroids.  Pt given her injection and discharged ambulatory.

## 2011-05-14 ENCOUNTER — Ambulatory Visit (HOSPITAL_BASED_OUTPATIENT_CLINIC_OR_DEPARTMENT_OTHER): Payer: Medicare Other

## 2011-05-14 ENCOUNTER — Other Ambulatory Visit: Payer: Self-pay | Admitting: *Deleted

## 2011-05-14 VITALS — BP 132/69 | HR 70 | Temp 97.4°F

## 2011-05-14 DIAGNOSIS — C569 Malignant neoplasm of unspecified ovary: Secondary | ICD-10-CM | POA: Diagnosis not present

## 2011-05-14 DIAGNOSIS — Z5189 Encounter for other specified aftercare: Secondary | ICD-10-CM

## 2011-05-14 MED ORDER — FILGRASTIM 300 MCG/0.5ML IJ SOLN
300.0000 ug | Freq: Once | INTRAMUSCULAR | Status: AC
  Administered 2011-05-14: 300 ug via SUBCUTANEOUS
  Filled 2011-05-14: qty 0.5

## 2011-05-15 ENCOUNTER — Ambulatory Visit (HOSPITAL_BASED_OUTPATIENT_CLINIC_OR_DEPARTMENT_OTHER): Payer: Medicare Other

## 2011-05-15 VITALS — BP 113/64 | HR 82 | Temp 98.4°F

## 2011-05-15 DIAGNOSIS — D649 Anemia, unspecified: Secondary | ICD-10-CM

## 2011-05-15 DIAGNOSIS — D709 Neutropenia, unspecified: Secondary | ICD-10-CM

## 2011-05-15 DIAGNOSIS — C569 Malignant neoplasm of unspecified ovary: Secondary | ICD-10-CM

## 2011-05-15 MED ORDER — FILGRASTIM 300 MCG/0.5ML IJ SOLN
300.0000 ug | Freq: Once | INTRAMUSCULAR | Status: AC
  Administered 2011-05-15: 300 ug via SUBCUTANEOUS

## 2011-05-15 NOTE — Patient Instructions (Signed)
Pt in for injection with no complaints.  Pt discharged home ambulatory and instructed to call with questions and concerns

## 2011-05-16 ENCOUNTER — Ambulatory Visit (HOSPITAL_BASED_OUTPATIENT_CLINIC_OR_DEPARTMENT_OTHER): Payer: Medicare Other

## 2011-05-16 ENCOUNTER — Telehealth: Payer: Self-pay

## 2011-05-16 VITALS — BP 156/78 | HR 67 | Temp 97.4°F

## 2011-05-16 DIAGNOSIS — C569 Malignant neoplasm of unspecified ovary: Secondary | ICD-10-CM | POA: Diagnosis not present

## 2011-05-16 MED ORDER — FILGRASTIM 300 MCG/0.5ML IJ SOLN
300.0000 ug | Freq: Once | INTRAMUSCULAR | Status: AC
  Administered 2011-05-16: 300 ug via SUBCUTANEOUS
  Filled 2011-05-16: qty 0.5

## 2011-05-16 NOTE — Telephone Encounter (Signed)
Pt. Given signed form dated 05-16-11 for approval for massage therapy at her injection appt. Today. A COPY OF THE FORM WAS SENT TO [employer] TO BE SCANNED INTO PT.'S EMR.

## 2011-05-18 ENCOUNTER — Telehealth: Payer: Self-pay | Admitting: Oncology

## 2011-05-18 ENCOUNTER — Other Ambulatory Visit: Payer: Self-pay | Admitting: Oncology

## 2011-05-18 ENCOUNTER — Inpatient Hospital Stay (HOSPITAL_COMMUNITY)
Admission: EM | Admit: 2011-05-18 | Discharge: 2011-05-21 | DRG: 391 | Disposition: A | Discharge status: Home / Self Care | Payer: Medicare Other | Attending: Internal Medicine | Admitting: Internal Medicine

## 2011-05-18 ENCOUNTER — Encounter (HOSPITAL_COMMUNITY): Payer: Self-pay

## 2011-05-18 ENCOUNTER — Emergency Department (HOSPITAL_COMMUNITY): Payer: Medicare Other

## 2011-05-18 DIAGNOSIS — E871 Hypo-osmolality and hyponatremia: Secondary | ICD-10-CM

## 2011-05-18 DIAGNOSIS — IMO0002 Reserved for concepts with insufficient information to code with codable children: Secondary | ICD-10-CM

## 2011-05-18 DIAGNOSIS — I10 Essential (primary) hypertension: Secondary | ICD-10-CM | POA: Diagnosis present

## 2011-05-18 DIAGNOSIS — A0472 Enterocolitis due to Clostridium difficile, not specified as recurrent: Secondary | ICD-10-CM

## 2011-05-18 DIAGNOSIS — IMO0001 Reserved for inherently not codable concepts without codable children: Secondary | ICD-10-CM | POA: Diagnosis present

## 2011-05-18 DIAGNOSIS — N760 Acute vaginitis: Secondary | ICD-10-CM | POA: Diagnosis present

## 2011-05-18 DIAGNOSIS — R350 Frequency of micturition: Secondary | ICD-10-CM | POA: Diagnosis present

## 2011-05-18 DIAGNOSIS — R509 Fever, unspecified: Secondary | ICD-10-CM | POA: Diagnosis not present

## 2011-05-18 DIAGNOSIS — R0602 Shortness of breath: Secondary | ICD-10-CM | POA: Diagnosis not present

## 2011-05-18 DIAGNOSIS — M1990 Unspecified osteoarthritis, unspecified site: Secondary | ICD-10-CM | POA: Diagnosis present

## 2011-05-18 DIAGNOSIS — D709 Neutropenia, unspecified: Secondary | ICD-10-CM | POA: Diagnosis not present

## 2011-05-18 DIAGNOSIS — D619 Aplastic anemia, unspecified: Secondary | ICD-10-CM | POA: Diagnosis not present

## 2011-05-18 DIAGNOSIS — K219 Gastro-esophageal reflux disease without esophagitis: Secondary | ICD-10-CM | POA: Diagnosis present

## 2011-05-18 DIAGNOSIS — T451X5A Adverse effect of antineoplastic and immunosuppressive drugs, initial encounter: Secondary | ICD-10-CM | POA: Diagnosis present

## 2011-05-18 DIAGNOSIS — E782 Mixed hyperlipidemia: Secondary | ICD-10-CM | POA: Diagnosis not present

## 2011-05-18 DIAGNOSIS — Z96659 Presence of unspecified artificial knee joint: Secondary | ICD-10-CM

## 2011-05-18 DIAGNOSIS — C569 Malignant neoplasm of unspecified ovary: Secondary | ICD-10-CM | POA: Diagnosis present

## 2011-05-18 DIAGNOSIS — M949 Disorder of cartilage, unspecified: Secondary | ICD-10-CM | POA: Diagnosis present

## 2011-05-18 DIAGNOSIS — M899 Disorder of bone, unspecified: Secondary | ICD-10-CM | POA: Diagnosis present

## 2011-05-18 DIAGNOSIS — E785 Hyperlipidemia, unspecified: Secondary | ICD-10-CM | POA: Diagnosis present

## 2011-05-18 DIAGNOSIS — A09 Infectious gastroenteritis and colitis, unspecified: Principal | ICD-10-CM | POA: Diagnosis present

## 2011-05-18 DIAGNOSIS — Z79899 Other long term (current) drug therapy: Secondary | ICD-10-CM

## 2011-05-18 DIAGNOSIS — Z880 Allergy status to penicillin: Secondary | ICD-10-CM

## 2011-05-18 DIAGNOSIS — R5081 Fever presenting with conditions classified elsewhere: Secondary | ICD-10-CM | POA: Diagnosis present

## 2011-05-18 DIAGNOSIS — R197 Diarrhea, unspecified: Secondary | ICD-10-CM | POA: Diagnosis present

## 2011-05-18 DIAGNOSIS — Z885 Allergy status to narcotic agent status: Secondary | ICD-10-CM

## 2011-05-18 DIAGNOSIS — Z9079 Acquired absence of other genital organ(s): Secondary | ICD-10-CM

## 2011-05-18 HISTORY — DX: Neutropenia, unspecified: D70.9

## 2011-05-18 LAB — COMPREHENSIVE METABOLIC PANEL
ALT: 11 U/L (ref 0–35)
AST: 16 U/L (ref 0–37)
Albumin: 3.4 g/dL — ABNORMAL LOW (ref 3.5–5.2)
Alkaline Phosphatase: 49 U/L (ref 39–117)
BUN: 22 mg/dL (ref 6–23)
CO2: 23 mEq/L (ref 19–32)
Calcium: 9.3 mg/dL (ref 8.4–10.5)
Chloride: 94 mEq/L — ABNORMAL LOW (ref 96–112)
Creatinine, Ser: 1.01 mg/dL (ref 0.50–1.10)
GFR calc Af Amer: 60 mL/min — ABNORMAL LOW (ref >90)
GFR calc non Af Amer: 52 mL/min — ABNORMAL LOW (ref >90)
Glucose, Bld: 112 mg/dL — ABNORMAL HIGH (ref 70–99)
Potassium: 4.1 mEq/L (ref 3.5–5.1)
Sodium: 127 mEq/L — ABNORMAL LOW (ref 135–145)
Total Bilirubin: 0.8 mg/dL (ref 0.3–1.2)
Total Protein: 7.1 g/dL (ref 6.0–8.3)

## 2011-05-18 LAB — URINALYSIS, ROUTINE W REFLEX MICROSCOPIC
Bilirubin Urine: NEGATIVE
Glucose, UA: NEGATIVE mg/dL
Ketones, ur: NEGATIVE mg/dL
Nitrite: NEGATIVE
Protein, ur: NEGATIVE mg/dL
Specific Gravity, Urine: 1.015 (ref 1.005–1.030)
Urobilinogen, UA: 0.2 mg/dL (ref 0.0–1.0)
pH: 6 (ref 5.0–8.0)

## 2011-05-18 LAB — CBC
HCT: 31.6 % — ABNORMAL LOW (ref 36.0–46.0)
Hemoglobin: 10.9 g/dL — ABNORMAL LOW (ref 12.0–15.0)
MCH: 31.5 pg (ref 26.0–34.0)
MCHC: 34.5 g/dL (ref 30.0–36.0)
MCV: 91.3 fL (ref 78.0–100.0)
Platelets: 124 10*3/uL — ABNORMAL LOW (ref 150–400)
RBC: 3.46 MIL/uL — ABNORMAL LOW (ref 3.87–5.11)
RDW: 14.4 % (ref 11.5–15.5)
WBC: 1.2 10*3/uL — CL (ref 4.0–10.5)

## 2011-05-18 LAB — DIFFERENTIAL
Basophils Absolute: 0 10*3/uL (ref 0.0–0.1)
Basophils Relative: 0 % (ref 0–1)
Eosinophils Absolute: 0 10*3/uL (ref 0.0–0.7)
Eosinophils Relative: 3 % (ref 0–5)
Lymphocytes Relative: 54 % — ABNORMAL HIGH (ref 12–46)
Lymphs Abs: 0.7 10*3/uL (ref 0.7–4.0)
Monocytes Absolute: 0.4 10*3/uL (ref 0.1–1.0)
Monocytes Relative: 34 % — ABNORMAL HIGH (ref 3–12)
Neutro Abs: 0.1 10*3/uL — ABNORMAL LOW (ref 1.7–7.7)
Neutrophils Relative %: 9 % — ABNORMAL LOW (ref 43–77)

## 2011-05-18 LAB — URINE MICROSCOPIC-ADD ON

## 2011-05-18 MED ORDER — VANCOMYCIN HCL IN DEXTROSE 1-5 GM/200ML-% IV SOLN
1000.0000 mg | Freq: Once | INTRAVENOUS | Status: AC
  Administered 2011-05-18: 1000 mg via INTRAVENOUS
  Filled 2011-05-18: qty 200

## 2011-05-18 MED ORDER — SODIUM CHLORIDE 0.9 % IV SOLN
500.0000 mg | Freq: Three times a day (TID) | INTRAVENOUS | Status: DC
  Administered 2011-05-18 – 2011-05-19 (×3): 500 mg via INTRAVENOUS
  Filled 2011-05-18 (×6): qty 0.5

## 2011-05-18 MED ORDER — SODIUM CHLORIDE 0.9 % IV BOLUS (SEPSIS)
500.0000 mL | Freq: Once | INTRAVENOUS | Status: AC
  Administered 2011-05-18: 500 mL via INTRAVENOUS

## 2011-05-18 MED ORDER — ONDANSETRON HCL 4 MG/2ML IJ SOLN
4.0000 mg | Freq: Once | INTRAMUSCULAR | Status: DC
  Filled 2011-05-18: qty 2

## 2011-05-18 NOTE — H&P (Addendum)
PCP:   Mickie Hillier, MD, MD  Oncology: Asencion Gowda  Chief Complaint:   Diarrhea and fever  HPI: [name] is a 78 y.o. female   has a past medical history of Ascites; Cystitis; GERD (gastroesophageal reflux disease); Hypertension; Hyponatremia; Hyperlipidemia; Osteopenia; Postmenopausal atrophic vaginitis; Vulvitis; Staph infection (2010); Urinary frequency; Ovarian cancer (01/25/2011); Complication of anesthesia; Pneumonia; Anemia; Blood transfusion; H/O hiatal hernia; and Arthritis.   Presented with  24 hours of fever and diarrhea. She has not felt well all day. At home it was 100.8. Diarrhea started last night but became more severe today, she took two immodium and now diarrhea have stopped. It was watery but non bloody,   She has  history of ovarian CA sp adjuvant chemo Carboplatin/Taxol started on 05/12/2011. Also states she had shots likely Neulasta. She is appearing non toxic.    Review of Systems:    Pertinent positives include:  Fevers fatigue, nausea, diarrhea,   Constitutional:  No weight loss, night sweats,, chills,  weight loss  HEENT:  No headaches, Difficulty swallowing,Tooth/dental problems,Sore throat,  No sneezing, itching, ear ache, nasal congestion, post nasal drip,  Cardio-vascular:  No chest pain, Orthopnea, PND, anasarca, dizziness, palpitations.no Bilateral lower extremity swelling  GI:  No heartburn, indigestion, abdominal pain, vomiting,  change in bowel habits, loss of appetite, melena, blood in stool, hematoemesis Resp:  no shortness of breath at rest. No dyspnea on exertion, No excess mucus, no productive cough, No non-productive cough, No coughing up of blood.No change in color of mucus.No wheezing. Skin:  no rash or lesions. No jaundice GU:  no dysuria, change in color of urine, no urgency or frequency. No straining to urinate.  No flank pain.  Musculoskeletal:  No joint pain or no joint swelling. No decreased range of motion. No back pain.   Psych:  No change in mood or affect. No depression or anxiety. No memory loss.  Neuro: no localizing neurological complaints, no tingling, no weakness, no double vision, no gait abnormality, no slurred speech, no confusion  Otherwise ROS are negative except for above, 10 systems were reviewed  Past Medical History: Past Medical History  Diagnosis Date  . Ascites   . Cystitis   . GERD (gastroesophageal reflux disease)   . Hypertension   . Hyponatremia   . Hyperlipidemia   . Osteopenia   . Postmenopausal atrophic vaginitis   . Vulvitis   . Staph infection 2010    after knee replacement  . Urinary frequency   . Ovarian cancer 01/25/2011  . Complication of anesthesia     Sodium drops per pt   . Pneumonia     hx of   . Anemia     hx of   . Blood transfusion     hx of 2011   . H/O hiatal hernia   . Arthritis     knees    Past Surgical History  Procedure Date  . Tonsillectomy   . Appendectomy   . Joint replacement     R knee in 2008, 5 operations on L knee  . Other surgical history     hx of C section 1966  . Laparotomy 04/01/2011    Procedure: EXPLORATORY LAPAROTOMY;  Surgeon: Jeannette Corpus, MD;  Location: [facility];  Service: Gynecology;  Laterality: N/A;  . Abdominal hysterectomy 04/01/2011    Procedure: HYSTERECTOMY ABDOMINAL;  Surgeon: Jeannette Corpus, MD;  Location: [facility];  Service: Gynecology;  Laterality: N/A;  . Salpingoophorectomy 04/01/2011  Procedure: SALPINGO OOPHERECTOMY;  Surgeon: Jeannette Corpus, MD;  Location: [facility];  Service: Gynecology;  Laterality: Bilateral;     Medications: Prior to Admission medications   Medication Sig Start Date End Date Taking? Authorizing Provider  acetaminophen (TYLENOL) 325 MG tablet Take 650 mg by mouth every 4 (four) hours as needed. Pain    Yes Historical Provider, MD  azelastine (ASTELIN) 137 MCG/SPRAY nasal spray Place 1 spray into the nose 2 (two) times daily. Use in each nostril as directed    Yes Historical Provider, MD  Calcium Citrate-Vitamin D (CITRACAL + D PO) Take 2 tablets by mouth daily.   Yes Historical Provider, MD  Cholecalciferol (VITAMIN D) 2000 UNITS tablet Take 2,000 Units by mouth daily.    Yes Historical Provider, MD  dexamethasone (DECADRON) 4 MG tablet Take 20 mg by mouth See admin instructions. TAKE 5 TABS= 20 MG 12 HRS. AND 6 HRS. PRIOR TO CHEMOTHERAPY.  EACH DOSE WITH FOOD. 04/18/11  Yes Lennis Buzzy Han, MD  esomeprazole (NEXIUM) 40 MG capsule Take 40 mg by mouth daily before breakfast.   Yes Historical Provider, MD  Hypertonic Nasal Wash (SINUS RINSE BOTTLE KIT NA) Place 1 spray into the nose 2 (two) times daily as needed. Allergies    Yes Historical Provider, MD  ibuprofen (ADVIL,MOTRIN) 200 MG tablet Take 200 mg by mouth every 6 (six) hours as needed. pain   Yes Historical Provider, MD  lisinopril (PRINIVIL,ZESTRIL) 10 MG tablet Take 10 mg by mouth daily after breakfast.    Yes Historical Provider, MD  Multiple Vitamins-Minerals (CENTRUM SILVER PO) Take 1 tablet by mouth daily.    Yes Historical Provider, MD  nadolol (CORGARD) 20 MG tablet Take 10 mg by mouth daily after breakfast.    Yes Historical Provider, MD  nystatin cream (MYCOSTATIN) Apply 1 application topically 3 (three) times daily as needed. Applies to Outside of vagina and anus for Itching  02/07/11  Yes Lennis Buzzy Han, MD  omega-3 acid ethyl esters (LOVAZA) 1 G capsule Take 1 g by mouth daily.   Yes Historical Provider, MD  ondansetron (ZOFRAN) 8 MG tablet TAKE 1-2 TABLETS EVERY 12-24 HRS. AS NEEDED FOR NAUSEA.  TAKE 1 TABLET IN THE MORNING AFTER CHEMOTHERAPY WHETHER OR NOT NAUSEA 01/22/11  Yes Lennis Buzzy Han, MD  Polyethyl Glycol-Propyl Glycol (SYSTANE) 0.4-0.3 % SOLN Place 1 drop into both eyes 4 (four) times daily.   Yes Historical Provider, MD  Ranibizumab (LUCENTIS IO) Place into the left eye once as needed. Pt receive from dr. Luciana Axe office [phone]   Yes Historical Provider, MD    vitamin E (VITAMIN E) 400 UNIT capsule Take 400 Units by mouth daily.   Yes Historical Provider, MD  zolpidem (AMBIEN) 10 MG tablet Take 10 mg by mouth at bedtime.    Yes Historical Provider, MD    Allergies:   Allergies  Allergen Reactions  . Iodine     REACTION: hives  . Morphine And Related     Given after knee replacement and code blue occurred  . Penicillins     REACTION: hives  . Shellfish Allergy     Pt has shellfish allergy only.  Has had IV contrast x 2 and did fine.    Social History:  Ambulatory  independently  Lives at  Home with Husband    reports that she quit smoking about 59 years ago. She has never used smokeless tobacco. She reports that she does not drink alcohol or use illicit drugs.  Family History: family history includes Cancer in her other; Diabetes in her brother; and Heart attack in her brother.    Physical Exam: Patient Vitals for the past 24 hrs:  BP Temp Temp src Pulse Resp SpO2  05/18/11 2214 120/76 mmHg 98.9 F (37.2 C) Oral 69  18  100 %  05/18/11 2010 129/73 mmHg 98.8 F (37.1 C) Oral 99  17  97 %  05/18/11 1750 145/84 mmHg 100.6 F (38.1 C) Oral 99  16  96 %    1. General:  in No Acute distress 2. Psychological: Alert and Oriented 3. Head/ENT:   Moist  Mucous Membranes                          Head Non traumatic, neck supple                          Normal  Dentition 4. SKIN: normal  Skin turgor,  Skin clean Dry and intact no rash 5. Heart: Regular rate and rhythm no Murmur, Rub or gallop 6. Lungs: Clear to auscultation bilaterally, no wheezes or crackles   7. Abdomen: Soft, non-tender, Non distended 8. Lower extremities: no clubbing, cyanosis, or edema 9. Neurologically Grossly intact, moving all 4 extremities equally 10. MSK: Normal range of motion  body mass index is unknown because there is no height or weight on file.   Labs on Admission:   [facility] 05/18/11 1919  NA 127*  K 4.1  CL 94*  CO2 23  GLUCOSE 112*   BUN 22  CREATININE 1.01  CALCIUM 9.3  MG --  PHOS --    Basename 05/18/11 1919  AST 16  ALT 11  ALKPHOS 49  BILITOT 0.8  PROT 7.1  ALBUMIN 3.4*   No results found for this basename: LIPASE:2,AMYLASE:2 in the last 72 hours  Basename 05/18/11 1919  WBC 1.2*  NEUTROABS 0.1*  HGB 10.9*  HCT 31.6*  MCV 91.3  PLT 124*   No results found for this basename: CKTOTAL:3,CKMB:3,CKMBINDEX:3,TROPONINI:3 in the last 72 hours No results found for this basename: TSH,T4TOTAL,FREET3,T3FREE,THYROIDAB in the last 72 hours No results found for this basename: VITAMINB12:2,FOLATE:2,FERRITIN:2,TIBC:2,IRON:2,RETICCTPCT:2 in the last 72 hours No results found for this basename: HGBA1C    The CrCl is unknown because both a height and weight (above a minimum accepted value) are required for this calculation. ABG No results found for this basename: phart, pco2, po2, hco3, tco2, acidbasedef, o2sat     No results found for this basename: DDIMER     Other results:  UA WBC 7-10 could be mild UTI   Cultures:    Component Value Date/Time   SDES FLUID lEFT KNEE SYNOVIAL FLUID 06/27/2009 0907   SDES FLUID LEFT KNEE SYNOVIAL FLUID 06/27/2009 0907   SDES FLUID LEFT KNEE SYNOVIAL FLUID 06/27/2009 0907   SPECREQUEST VANCOMYCIN IMMUNE:NORM 06/27/2009 0907   SPECREQUEST VANCOMYCIN IMMUNE:NORM 06/27/2009 0907   SPECREQUEST VANCOMYCIN IMMUNE:NORM 06/27/2009 0907   CULT NO ANAEROBES ISOLATED 06/27/2009 0907   CULT NO GROWTH 3 DAYS 06/27/2009 0907   REPTSTATUS 07/02/2009 FINAL 06/27/2009 0907   REPTSTATUS 06/27/2009 FINAL 06/27/2009 0907   REPTSTATUS 06/30/2009 FINAL 06/27/2009 0907       Radiological Exams on Admission: Dg Chest 2 View  05/18/2011  *RADIOLOGY REPORT*  Clinical Data: Shortness of breath.  CHEST - 2 VIEW  Comparison: 05/11/2009.  Findings: No infiltrate, congestive heart failure or pneumothorax. Calcified aorta.  Heart size within normal limits.  Thoracic kyphosis.  IMPRESSION: No infiltrate or  congestive heart failure.  Original Report Authenticated By: Fuller Canada, M.D.    Assessment/Plan  78 yo F with Neutropenic fever mild UTI and diarrhea  Present on Admission:  .Ovarian cancer - would oncology know that patient  Is here in the morning.  .Diarrhea - this was short-lived and currently improved after patient took Imodium ED physician did try for C. difficile PCR but is not back her symptoms are somewhat atypical for C. difficile she had not had any antibiotic exposure  .Neutropenia with fever - she does appear to have mild UTI which may be the cause for now will broadly with vancomycin and meropenem patient is penicillin allergic  Prophylaxis:  Lovenox, Protonix UTI - should be covered with meropenem awaiting results of urine culture  hyponatremia - likely secondary to dehydration, will give IVF, check Urine Na, Cr, Osmolarity. Monitor Na levels to avoid over aggressive correction. Check TSH. Stop offending medications. If no improvement with IVF will initiate further work up for SIADH if appropriate.   CODE STATUS:  Full CODE  I have spent a total of 65 min on this admition  Vyla Pint 05/18/2011, 10:45 PM

## 2011-05-18 NOTE — ED Provider Notes (Signed)
History     CSN: [id number]  Arrival date & time 05/18/11  1726   First MD Initiated Contact with Patient 05/18/11 1807      Chief Complaint  Patient presents with  . Nausea  . Emesis  . Diarrhea    (Consider location/radiation/quality/duration/timing/severity/associated sxs/prior treatment) HPI Pt currently undergoing chemotherapy for ovarian ca. Last session was this week. Pt p/w 1 day of 6 episodes of diarrhea and low grade fever to 100.7. Discussed with oncology on call and advised to present to the ED. Pt denies chill, SOB, cough, sore throat, N/V, urinary symptoms or blood in stool. Took imodium with resolution of diarrhea Past Medical History  Diagnosis Date  . Ascites   . Cystitis   . GERD (gastroesophageal reflux disease)   . Hypertension   . Hyponatremia   . Hyperlipidemia   . Osteopenia   . Postmenopausal atrophic vaginitis   . Vulvitis   . Staph infection 2010    after knee replacement  . Urinary frequency   . Ovarian cancer 01/25/2011  . Complication of anesthesia     Sodium drops per pt   . Pneumonia     hx of   . Anemia     hx of   . Blood transfusion     hx of 2011   . H/O hiatal hernia   . Arthritis     knees     Past Surgical History  Procedure Date  . Tonsillectomy   . Appendectomy   . Joint replacement     R knee in 2008, 5 operations on L knee  . Other surgical history     hx of C section 1966  . Laparotomy 04/01/2011    Procedure: EXPLORATORY LAPAROTOMY;  Surgeon: Jeannette Corpus, MD;  Location: [facility];  Service: Gynecology;  Laterality: N/A;  . Abdominal hysterectomy 04/01/2011    Procedure: HYSTERECTOMY ABDOMINAL;  Surgeon: Jeannette Corpus, MD;  Location: [facility];  Service: Gynecology;  Laterality: N/A;  . Salpingoophorectomy 04/01/2011    Procedure: SALPINGO OOPHERECTOMY;  Surgeon: Jeannette Corpus, MD;  Location: [facility];  Service: Gynecology;  Laterality: Bilateral;    Family History  Problem Relation Age of  Onset  . Heart attack Brother   . Diabetes Brother   . Cancer Other     Bladder cancer    History  Substance Use Topics  . Smoking status: Former Smoker    Quit date: 02/11/1952  . Smokeless tobacco: Never Used  . Alcohol Use: No    OB History    Grav Para Term Preterm Abortions TAB SAB Ect Mult Living                  Review of Systems  Constitutional: Positive for fever. Negative for chills.  HENT: Negative for sore throat, neck pain and neck stiffness.   Respiratory: Negative for shortness of breath and wheezing.   Cardiovascular: Negative for chest pain, palpitations and leg swelling.  Gastrointestinal: Positive for diarrhea. Negative for nausea, vomiting, abdominal pain and blood in stool.  Genitourinary: Negative for dysuria, hematuria and flank pain.  Musculoskeletal: Negative for myalgias and back pain.  Skin: Negative for rash.  Neurological: Negative for weakness, numbness and headaches.    Allergies  Iodine; Morphine and related; Penicillins; and Shellfish allergy  Home Medications   Current Outpatient Rx  Name Route Sig Dispense Refill  . ACETAMINOPHEN 325 MG PO TABS Oral Take 650 mg by mouth every 4 (four) hours as  needed. Pain     . AZELASTINE HCL 137 MCG/SPRAY NA SOLN Nasal Place 1 spray into the nose 2 (two) times daily. Use in each nostril as directed    . CITRACAL + D PO Oral Take 2 tablets by mouth daily.    Marland Kitchen VITAMIN D 2000 UNITS PO TABS Oral Take 2,000 Units by mouth daily.     Marland Kitchen DEXAMETHASONE 4 MG PO TABS Oral Take 20 mg by mouth See admin instructions. TAKE 5 TABS= 20 MG 12 HRS. AND 6 HRS. PRIOR TO CHEMOTHERAPY.  EACH DOSE WITH FOOD.    Marland Kitchen ESOMEPRAZOLE MAGNESIUM 40 MG PO CPDR Oral Take 40 mg by mouth daily before breakfast.    . SINUS RINSE BOTTLE KIT NA Nasal Place 1 spray into the nose 2 (two) times daily as needed. Allergies     . IBUPROFEN 200 MG PO TABS Oral Take 200 mg by mouth every 6 (six) hours as needed. pain    . LISINOPRIL 10 MG PO  TABS Oral Take 10 mg by mouth daily after breakfast.     . CENTRUM SILVER PO Oral Take 1 tablet by mouth daily.     Marland Kitchen NADOLOL 20 MG PO TABS Oral Take 10 mg by mouth daily after breakfast.     . NYSTATIN 100000 UNIT/GM EX CREA Topical Apply 1 application topically 3 (three) times daily as needed. Applies to Outside of vagina and anus for Itching     . OMEGA-3-ACID ETHYL ESTERS 1 G PO CAPS Oral Take 1 g by mouth daily.    Marland Kitchen ONDANSETRON HCL 8 MG PO TABS  TAKE 1-2 TABLETS EVERY 12-24 HRS. AS NEEDED FOR NAUSEA.  TAKE 1 TABLET IN THE MORNING AFTER CHEMOTHERAPY WHETHER OR NOT NAUSEA 20 tablet 1  . POLYETHYL GLYCOL-PROPYL GLYCOL 0.4-0.3 % OP SOLN Both Eyes Place 1 drop into both eyes 4 (four) times daily.    . LUCENTIS IO Left Eye Place into the left eye once as needed. Pt receive from dr. Luciana Axe office [phone]    . VITAMIN E 400 UNITS PO CAPS Oral Take 400 Units by mouth daily.    Marland Kitchen ZOLPIDEM TARTRATE 10 MG PO TABS Oral Take 10 mg by mouth at bedtime.       BP 120/76  Pulse 69  Temp(Src) 98.9 F (37.2 C) (Oral)  Resp 18  SpO2 100%  Physical Exam  Nursing note and vitals reviewed. Constitutional: She is oriented to person, place, and time. She appears well-developed and well-nourished. No distress.  HENT:  Head: Normocephalic and atraumatic.  Mouth/Throat: Oropharynx is clear and moist.  Eyes: EOM are normal. Pupils are equal, round, and reactive to light.  Neck: Normal range of motion. Neck supple.       No meningismus   Cardiovascular: Normal rate and regular rhythm.   Pulmonary/Chest: Effort normal and breath sounds normal. No respiratory distress. She has no wheezes. She has no rales.  Abdominal: Soft. Bowel sounds are normal. She exhibits no mass. There is no tenderness. There is no rebound and no guarding.       well healing abd scar  Musculoskeletal: Normal range of motion. She exhibits no edema and no tenderness.  Neurological: She is alert and oriented to person, place, and  time.       5/5 motor, sensation intact  Skin: Skin is warm and dry. No rash noted. No erythema.  Psychiatric: She has a normal mood and affect. Her behavior is normal.    ED Course  Procedures (including critical care time)  Labs Reviewed  CBC - Abnormal; Notable for the following:    WBC 1.2 (*)    RBC 3.46 (*)    Hemoglobin 10.9 (*)    HCT 31.6 (*)    Platelets 124 (*)    All other components within normal limits  DIFFERENTIAL - Abnormal; Notable for the following:    Neutrophils Relative 9 (*)    Lymphocytes Relative 54 (*)    Monocytes Relative 34 (*)    Neutro Abs 0.1 (*)    All other components within normal limits  COMPREHENSIVE METABOLIC PANEL - Abnormal; Notable for the following:    Sodium 127 (*)    Chloride 94 (*)    Glucose, Bld 112 (*)    Albumin 3.4 (*)    GFR calc non Af Amer 52 (*)    GFR calc Af Amer 60 (*)    All other components within normal limits  URINALYSIS, ROUTINE W REFLEX MICROSCOPIC - Abnormal; Notable for the following:    Hgb urine dipstick LARGE (*)    Leukocytes, UA SMALL (*)    All other components within normal limits  URINE MICROSCOPIC-ADD ON  URINE CULTURE  CULTURE, BLOOD (ROUTINE X 2)  CULTURE, BLOOD (ROUTINE X 2)  PATHOLOGIST SMEAR REVIEW  CLOSTRIDIUM DIFFICILE BY PCR   Dg Chest 2 View  05/18/2011  *RADIOLOGY REPORT*  Clinical Data: Shortness of breath.  CHEST - 2 VIEW  Comparison: 05/11/2009.  Findings: No infiltrate, congestive heart failure or pneumothorax. Calcified aorta.  Heart size within normal limits.  Thoracic kyphosis.  IMPRESSION: No infiltrate or congestive heart failure.  Original Report Authenticated By: Fuller Canada, M.D.     1. Neutropenic fever   2. Hyponatremia   3. Diarrhea       MDM         Loren Racer, MD 05/18/11 2239

## 2011-05-18 NOTE — ED Notes (Signed)
Departure note written on wrong patient. Patient not leaving at this time.

## 2011-05-18 NOTE — Telephone Encounter (Signed)
Pt called to report temperature to 100.58F.  She has ovarian cancer.  She received neoadjuvant chemo and resection.  She then resumed adjuvant chemo Carboplatin/Taxol on 05/12/2011.  She is at risk of neutropenic fever by now.  She also has been having diarrhea (not commonly seen with this chemo).  I advised her to present to [facility] ED for evaluation.

## 2011-05-18 NOTE — ED Notes (Addendum)
Pt. States N/V/D started yesterday afternoon and persists until now.  Pt. States she took Zofran at home yesterday with no relief.  A.O. x4 NAD. Pt. Is a chemo pt.  Htx of ovarian cancer.

## 2011-05-19 ENCOUNTER — Other Ambulatory Visit: Payer: Self-pay | Admitting: Oncology

## 2011-05-19 DIAGNOSIS — Z96659 Presence of unspecified artificial knee joint: Secondary | ICD-10-CM | POA: Diagnosis not present

## 2011-05-19 DIAGNOSIS — R5081 Fever presenting with conditions classified elsewhere: Secondary | ICD-10-CM

## 2011-05-19 DIAGNOSIS — D709 Neutropenia, unspecified: Secondary | ICD-10-CM | POA: Diagnosis not present

## 2011-05-19 DIAGNOSIS — IMO0001 Reserved for inherently not codable concepts without codable children: Secondary | ICD-10-CM | POA: Diagnosis present

## 2011-05-19 DIAGNOSIS — E785 Hyperlipidemia, unspecified: Secondary | ICD-10-CM | POA: Diagnosis present

## 2011-05-19 DIAGNOSIS — R509 Fever, unspecified: Secondary | ICD-10-CM | POA: Diagnosis not present

## 2011-05-19 DIAGNOSIS — Z885 Allergy status to narcotic agent status: Secondary | ICD-10-CM | POA: Diagnosis not present

## 2011-05-19 DIAGNOSIS — R197 Diarrhea, unspecified: Secondary | ICD-10-CM | POA: Diagnosis not present

## 2011-05-19 DIAGNOSIS — I10 Essential (primary) hypertension: Secondary | ICD-10-CM | POA: Diagnosis present

## 2011-05-19 DIAGNOSIS — C569 Malignant neoplasm of unspecified ovary: Secondary | ICD-10-CM | POA: Diagnosis not present

## 2011-05-19 DIAGNOSIS — M899 Disorder of bone, unspecified: Secondary | ICD-10-CM | POA: Diagnosis present

## 2011-05-19 DIAGNOSIS — E871 Hypo-osmolality and hyponatremia: Secondary | ICD-10-CM | POA: Diagnosis not present

## 2011-05-19 DIAGNOSIS — D61818 Other pancytopenia: Secondary | ICD-10-CM | POA: Diagnosis not present

## 2011-05-19 DIAGNOSIS — A09 Infectious gastroenteritis and colitis, unspecified: Secondary | ICD-10-CM | POA: Diagnosis present

## 2011-05-19 DIAGNOSIS — N760 Acute vaginitis: Secondary | ICD-10-CM | POA: Diagnosis present

## 2011-05-19 DIAGNOSIS — Z880 Allergy status to penicillin: Secondary | ICD-10-CM | POA: Diagnosis not present

## 2011-05-19 DIAGNOSIS — R5381 Other malaise: Secondary | ICD-10-CM | POA: Diagnosis not present

## 2011-05-19 DIAGNOSIS — R350 Frequency of micturition: Secondary | ICD-10-CM | POA: Diagnosis present

## 2011-05-19 DIAGNOSIS — T451X5A Adverse effect of antineoplastic and immunosuppressive drugs, initial encounter: Secondary | ICD-10-CM | POA: Diagnosis present

## 2011-05-19 DIAGNOSIS — IMO0002 Reserved for concepts with insufficient information to code with codable children: Secondary | ICD-10-CM | POA: Diagnosis not present

## 2011-05-19 DIAGNOSIS — M1990 Unspecified osteoarthritis, unspecified site: Secondary | ICD-10-CM | POA: Diagnosis present

## 2011-05-19 DIAGNOSIS — K219 Gastro-esophageal reflux disease without esophagitis: Secondary | ICD-10-CM | POA: Diagnosis present

## 2011-05-19 DIAGNOSIS — Z9079 Acquired absence of other genital organ(s): Secondary | ICD-10-CM | POA: Diagnosis not present

## 2011-05-19 DIAGNOSIS — Z79899 Other long term (current) drug therapy: Secondary | ICD-10-CM | POA: Diagnosis not present

## 2011-05-19 LAB — COMPREHENSIVE METABOLIC PANEL
ALT: 10 U/L (ref 0–35)
AST: 14 U/L (ref 0–37)
Albumin: 3.1 g/dL — ABNORMAL LOW (ref 3.5–5.2)
Alkaline Phosphatase: 51 U/L (ref 39–117)
BUN: 18 mg/dL (ref 6–23)
CO2: 23 mEq/L (ref 19–32)
Calcium: 8.9 mg/dL (ref 8.4–10.5)
Chloride: 99 mEq/L (ref 96–112)
Creatinine, Ser: 0.98 mg/dL (ref 0.50–1.10)
GFR calc Af Amer: 62 mL/min — ABNORMAL LOW (ref >90)
GFR calc non Af Amer: 53 mL/min — ABNORMAL LOW (ref >90)
Glucose, Bld: 112 mg/dL — ABNORMAL HIGH (ref 70–99)
Potassium: 4 mEq/L (ref 3.5–5.1)
Sodium: 132 mEq/L — ABNORMAL LOW (ref 135–145)
Total Bilirubin: 0.6 mg/dL (ref 0.3–1.2)
Total Protein: 6.9 g/dL (ref 6.0–8.3)

## 2011-05-19 LAB — CBC
HCT: 30.8 % — ABNORMAL LOW (ref 36.0–46.0)
Hemoglobin: 10.6 g/dL — ABNORMAL LOW (ref 12.0–15.0)
MCH: 31.4 pg (ref 26.0–34.0)
MCHC: 34.4 g/dL (ref 30.0–36.0)
MCV: 91.1 fL (ref 78.0–100.0)
Platelets: 127 10*3/uL — ABNORMAL LOW (ref 150–400)
RBC: 3.38 MIL/uL — ABNORMAL LOW (ref 3.87–5.11)
RDW: 14.4 % (ref 11.5–15.5)
WBC: 1.5 10*3/uL — ABNORMAL LOW (ref 4.0–10.5)

## 2011-05-19 LAB — BASIC METABOLIC PANEL
BUN: 16 mg/dL (ref 6–23)
BUN: 14 mg/dL (ref 6–23)
CO2: 24 mEq/L (ref 19–32)
CO2: 22 mEq/L (ref 19–32)
Calcium: 8.3 mg/dL — ABNORMAL LOW (ref 8.4–10.5)
Calcium: 8.7 mg/dL (ref 8.4–10.5)
Chloride: 100 mEq/L (ref 96–112)
Chloride: 98 mEq/L (ref 96–112)
Creatinine, Ser: 0.94 mg/dL (ref 0.50–1.10)
Creatinine, Ser: 0.92 mg/dL (ref 0.50–1.10)
GFR calc Af Amer: 65 mL/min — ABNORMAL LOW (ref >90)
GFR calc Af Amer: 67 mL/min — ABNORMAL LOW (ref >90)
GFR calc non Af Amer: 56 mL/min — ABNORMAL LOW (ref >90)
GFR calc non Af Amer: 58 mL/min — ABNORMAL LOW (ref >90)
Glucose, Bld: 109 mg/dL — ABNORMAL HIGH (ref 70–99)
Glucose, Bld: 126 mg/dL — ABNORMAL HIGH (ref 70–99)
Potassium: 3.8 mEq/L (ref 3.5–5.1)
Potassium: 4.2 mEq/L (ref 3.5–5.1)
Sodium: 132 mEq/L — ABNORMAL LOW (ref 135–145)
Sodium: 131 mEq/L — ABNORMAL LOW (ref 135–145)

## 2011-05-19 LAB — TSH: TSH: 3.885 u[IU]/mL (ref 0.350–4.500)

## 2011-05-19 LAB — PATHOLOGIST SMEAR REVIEW

## 2011-05-19 MED ORDER — SODIUM CHLORIDE 0.9 % IV SOLN
INTRAVENOUS | Status: AC
  Administered 2011-05-19: 07:00:00 via INTRAVENOUS

## 2011-05-19 MED ORDER — LISINOPRIL 10 MG PO TABS
10.0000 mg | ORAL_TABLET | Freq: Every day | ORAL | Status: DC
  Administered 2011-05-19 – 2011-05-21 (×3): 10 mg via ORAL
  Filled 2011-05-19 (×3): qty 1

## 2011-05-19 MED ORDER — ALBUTEROL SULFATE (5 MG/ML) 0.5% IN NEBU: 2.5000 mg | INHALATION_SOLUTION | RESPIRATORY_TRACT | Status: DC | PRN

## 2011-05-19 MED ORDER — SODIUM CHLORIDE 0.9 % IJ SOLN
3.0000 mL | Freq: Two times a day (BID) | INTRAMUSCULAR | Status: DC
  Administered 2011-05-21: 3 mL via INTRAVENOUS

## 2011-05-19 MED ORDER — ONDANSETRON HCL 4 MG PO TABS: 4.0000 mg | ORAL_TABLET | Freq: Four times a day (QID) | ORAL | Status: DC | PRN

## 2011-05-19 MED ORDER — OMEPRAZOLE 20 MG PO CPDR
20.0000 mg | DELAYED_RELEASE_CAPSULE | Freq: Every day | ORAL | Status: DC
  Administered 2011-05-19 – 2011-05-21 (×3): 20 mg via ORAL
  Filled 2011-05-19 (×3): qty 1

## 2011-05-19 MED ORDER — ZOLPIDEM TARTRATE 10 MG PO TABS
10.0000 mg | ORAL_TABLET | Freq: Every day | ORAL | Status: DC
  Administered 2011-05-19: 10 mg via ORAL
  Administered 2011-05-19: 5 mg via ORAL
  Administered 2011-05-20: 10 mg via ORAL
  Filled 2011-05-19 (×3): qty 1

## 2011-05-19 MED ORDER — GUAIFENESIN-DM 100-10 MG/5ML PO SYRP: 5.0000 mL | ORAL_SOLUTION | ORAL | Status: DC | PRN

## 2011-05-19 MED ORDER — ENOXAPARIN SODIUM 40 MG/0.4ML ~~LOC~~ SOLN
40.0000 mg | SUBCUTANEOUS | Status: DC
  Filled 2011-05-19 (×3): qty 0.4

## 2011-05-19 MED ORDER — FILGRASTIM 480 MCG/1.6ML IJ SOLN
480.0000 ug | Freq: Every day | INTRAMUSCULAR | Status: DC
  Filled 2011-05-19 (×3): qty 1.6

## 2011-05-19 MED ORDER — HYDROCODONE-ACETAMINOPHEN 5-325 MG PO TABS
1.0000 | ORAL_TABLET | ORAL | Status: DC | PRN
  Filled 2011-05-19: qty 2

## 2011-05-19 MED ORDER — ONDANSETRON HCL 4 MG/2ML IJ SOLN: 4.0000 mg | Freq: Four times a day (QID) | INTRAMUSCULAR | Status: DC | PRN

## 2011-05-19 MED ORDER — OXYCODONE HCL 5 MG PO TABS
5.0000 mg | ORAL_TABLET | ORAL | Status: DC | PRN
  Administered 2011-05-20: 5 mg via ORAL
  Filled 2011-05-19: qty 1

## 2011-05-19 MED ORDER — VANCOMYCIN HCL 1000 MG IV SOLR
750.0000 mg | Freq: Two times a day (BID) | INTRAVENOUS | Status: DC
  Administered 2011-05-19: 750 mg via INTRAVENOUS
  Filled 2011-05-19 (×2): qty 750

## 2011-05-19 MED ORDER — HYDROCODONE-ACETAMINOPHEN 5-325 MG PO TABS: 1.0000 | ORAL_TABLET | Freq: Four times a day (QID) | ORAL | Status: DC | PRN

## 2011-05-19 MED ORDER — ACETAMINOPHEN 650 MG RE SUPP: 650.0000 mg | Freq: Four times a day (QID) | RECTAL | Status: DC | PRN

## 2011-05-19 MED ORDER — PANTOPRAZOLE SODIUM 40 MG PO TBEC
40.0000 mg | DELAYED_RELEASE_TABLET | Freq: Every day | ORAL | Status: DC
  Filled 2011-05-19: qty 1

## 2011-05-19 MED ORDER — SODIUM CHLORIDE 0.9 % IV SOLN
1.0000 g | Freq: Three times a day (TID) | INTRAVENOUS | Status: DC
  Filled 2011-05-19 (×2): qty 1

## 2011-05-19 MED ORDER — VITAMIN E 180 MG (400 UNIT) PO CAPS
400.0000 [IU] | ORAL_CAPSULE | Freq: Every day | ORAL | Status: DC
  Administered 2011-05-20 – 2011-05-21 (×2): 400 [IU] via ORAL
  Filled 2011-05-19 (×3): qty 1

## 2011-05-19 MED ORDER — METRONIDAZOLE 500 MG PO TABS
500.0000 mg | ORAL_TABLET | Freq: Three times a day (TID) | ORAL | Status: DC
  Administered 2011-05-20 – 2011-05-21 (×3): 500 mg via ORAL
  Filled 2011-05-19 (×8): qty 1

## 2011-05-19 MED ORDER — B COMPLEX-C PO TABS
1.0000 | ORAL_TABLET | Freq: Every day | ORAL | Status: DC
  Administered 2011-05-20 – 2011-05-21 (×2): 1 via ORAL
  Filled 2011-05-19 (×3): qty 1

## 2011-05-19 MED ORDER — OMEGA-3-ACID ETHYL ESTERS 1 G PO CAPS
1.0000 g | ORAL_CAPSULE | Freq: Every day | ORAL | Status: DC
  Administered 2011-05-20 – 2011-05-21 (×2): 1 g via ORAL
  Filled 2011-05-19 (×3): qty 1

## 2011-05-19 MED ORDER — POLYVINYL ALCOHOL 1.4 % OP SOLN
1.0000 [drp] | Freq: Four times a day (QID) | OPHTHALMIC | Status: DC
  Administered 2011-05-19 – 2011-05-20 (×8): 1 [drp] via OPHTHALMIC
  Filled 2011-05-19: qty 15

## 2011-05-19 MED ORDER — NADOLOL 20 MG PO TABS
10.0000 mg | ORAL_TABLET | Freq: Every day | ORAL | Status: DC
  Administered 2011-05-19 – 2011-05-21 (×3): 10 mg via ORAL
  Filled 2011-05-19 (×3): qty 1

## 2011-05-19 MED ORDER — ACETAMINOPHEN 325 MG PO TABS
650.0000 mg | ORAL_TABLET | Freq: Four times a day (QID) | ORAL | Status: DC | PRN
  Administered 2011-05-20: 650 mg via ORAL
  Administered 2011-05-21: 325 mg via ORAL
  Filled 2011-05-19: qty 2
  Filled 2011-05-19 (×2): qty 1

## 2011-05-19 MED ORDER — AZELASTINE HCL 0.1 % NA SOLN
1.0000 | Freq: Two times a day (BID) | NASAL | Status: DC
  Administered 2011-05-19 – 2011-05-20 (×3): 1 via NASAL
  Filled 2011-05-19: qty 30

## 2011-05-19 MED ORDER — VITAMIN D 1000 UNITS PO TABS
2000.0000 [IU] | ORAL_TABLET | Freq: Every day | ORAL | Status: DC
  Administered 2011-05-19 – 2011-05-21 (×3): 2000 [IU] via ORAL
  Filled 2011-05-19 (×3): qty 2

## 2011-05-19 MED ORDER — ALUM & MAG HYDROXIDE-SIMETH 200-200-20 MG/5ML PO SUSP: 30.0000 mL | Freq: Four times a day (QID) | ORAL | Status: DC | PRN

## 2011-05-19 NOTE — Consult Note (Signed)
[facility] INPATIENT PROGRESS NOTE  Name: [name]      MRN: [id number]    Location: 1422/1422-01  Date: 05/19/2011 Time:12:37 PM   Subjective: Interval History:[name] reported that she had fever over the weekend and last night, it was 100.1F.  As I was on call yesterday, I advised her to present to the ED.  She also had diarrhea this weekend.  She took imodium and has not had any more diarrheal episode since admission.  She denies high grade fever since admission.  She denies skin rash, joint swelling, head ache, mucositis, nausea/vomiting, abdominal pain, abdominal distension, dysuria, pyuria.  She still has light vaginal spotting ever since she had her ovarian cancer surgery.    Objective: Vital signs in last 24 hours: Temp:  [97.2 F (36.2 C)-100.6 F (38.1 C)] 99.1 F (37.3 C) (04/08 0547) Pulse Rate:  [69-99] 88  (04/08 0547) Resp:  [16-18] 18  (04/08 0547) BP: (110-145)/(55-84) 138/81 mmHg (04/08 0547) SpO2:  [96 %-100 %] 97 % (04/08 0555) FiO2 (%):  [21 %] 21 % (04/08 0555) Weight:  [161 lb (73.029 kg)-163 lb 5.8 oz (74.1 kg)] 163 lb 5.8 oz (74.1 kg) (04/08 0547)    Intake/Output from previous day: 04/07 0701 - 04/08 0700 In: 50  Out: 50 [Urine:50]    PHYSICAL EXAM:  Gen: Well-nourished, in no acute distress. Eyes: No scleral icterus or jaundice. ENT: There was no oropharyngeal lesions. Neck was supple without thyromegaly. Lymphatics: Negative for cervical, supraclavicular, axillary, or inguinal adenopathy.  Respiratory: Lungs were clear bilaterally without wheezing or crackles. Cardiovascular: normal heart rate and rhythm; S1/S2; without murmur, rubs, or gallop. There was no pedal edema. GI: Abdomen was soft, flat, nontender, nondistended, without organomegaly.  Midline lower abdominal incision wound well healed, dry, clean, intact.  Musculoskeletal exam: No spinal tenderness on palpation of vertebral spine. Skin exam was without ecchymosis,  petechiae. Gait was normal. Patient was alert and oriented. Attention was good. Language was appropriate. Mood was normal without depression. Speech was not pressured. Thought content was not tangential.      Studies/Results: Results for orders placed during the hospital encounter of 05/18/11 (from the past 48 hour(s))  CBC     Status: Abnormal   Collection Time   05/18/11  7:19 PM      Component Value Range Comment   WBC 1.2 (*) 4.0 - 10.5 (K/uL)    RBC 3.46 (*) 3.87 - 5.11 (MIL/uL)    Hemoglobin 10.9 (*) 12.0 - 15.0 (g/dL)    HCT 40.9 (*) 81.1 - 46.0 (%)    MCV 91.3  78.0 - 100.0 (fL)    MCH 31.5  26.0 - 34.0 (pg)    MCHC 34.5  30.0 - 36.0 (g/dL)    RDW 91.4  78.2 - 95.6 (%)    Platelets 124 (*) 150 - 400 (K/uL)   DIFFERENTIAL     Status: Abnormal   Collection Time   05/18/11  7:19 PM      Component Value Range Comment   Neutrophils Relative 9 (*) 43 - 77 (%)    Lymphocytes Relative 54 (*) 12 - 46 (%)    Monocytes Relative 34 (*) 3 - 12 (%)    Eosinophils Relative 3  0 - 5 (%)    Basophils Relative 0  0 - 1 (%)    Neutro Abs 0.1 (*) 1.7 - 7.7 (K/uL)    Lymphs Abs 0.7  0.7 - 4.0 (K/uL)  Monocytes Absolute 0.4  0.1 - 1.0 (K/uL)    Eosinophils Absolute 0.0  0.0 - 0.7 (K/uL)    Basophils Absolute 0.0  0.0 - 0.1 (K/uL)    Smear Review MORPHOLOGY UNREMARKABLE     COMPREHENSIVE METABOLIC PANEL     Status: Abnormal   Collection Time   05/18/11  7:19 PM      Component Value Range Comment   Sodium 127 (*) 135 - 145 (mEq/L)    Potassium 4.1  3.5 - 5.1 (mEq/L)    Chloride 94 (*) 96 - 112 (mEq/L)    CO2 23  19 - 32 (mEq/L)    Glucose, Bld 112 (*) 70 - 99 (mg/dL)    BUN 22  6 - 23 (mg/dL)    Creatinine, Ser 4.09  0.50 - 1.10 (mg/dL)    Calcium 9.3  8.4 - 10.5 (mg/dL)    Total Protein 7.1  6.0 - 8.3 (g/dL)    Albumin 3.4 (*) 3.5 - 5.2 (g/dL)    AST 16  0 - 37 (U/L)    ALT 11  0 - 35 (U/L)    Alkaline Phosphatase 49  39 - 117 (U/L)    Total Bilirubin 0.8  0.3 - 1.2 (mg/dL)    GFR  calc non Af Amer 52 (*) >90 (mL/min)    GFR calc Af Amer 60 (*) >90 (mL/min)   PATHOLOGIST SMEAR REVIEW     Status: Normal   Collection Time   05/18/11  7:19 PM      Component Value Range Comment   Tech Review Reviewed by Beulah Gandy. Luisa Hart, M.D.     URINALYSIS, ROUTINE W REFLEX MICROSCOPIC     Status: Abnormal   Collection Time   05/18/11  8:14 PM      Component Value Range Comment   Color, Urine YELLOW  YELLOW     APPearance CLEAR  CLEAR     Specific Gravity, Urine 1.015  1.005 - 1.030     pH 6.0  5.0 - 8.0     Glucose, UA NEGATIVE  NEGATIVE (mg/dL)    Hgb urine dipstick LARGE (*) NEGATIVE     Bilirubin Urine NEGATIVE  NEGATIVE     Ketones, ur NEGATIVE  NEGATIVE (mg/dL)    Protein, ur NEGATIVE  NEGATIVE (mg/dL)    Urobilinogen, UA 0.2  0.0 - 1.0 (mg/dL)    Nitrite NEGATIVE  NEGATIVE     Leukocytes, UA SMALL (*) NEGATIVE    URINE MICROSCOPIC-ADD ON     Status: Normal   Collection Time   05/18/11  8:14 PM      Component Value Range Comment   Squamous Epithelial / LPF RARE  RARE     WBC, UA 7-10  <3 (WBC/hpf)    RBC / HPF 0-2  <3 (RBC/hpf)    Bacteria, UA RARE  RARE    COMPREHENSIVE METABOLIC PANEL     Status: Abnormal   Collection Time   05/19/11  6:30 AM      Component Value Range Comment   Sodium 132 (*) 135 - 145 (mEq/L)    Potassium 4.0  3.5 - 5.1 (mEq/L)    Chloride 99  96 - 112 (mEq/L)    CO2 23  19 - 32 (mEq/L)    Glucose, Bld 112 (*) 70 - 99 (mg/dL)    BUN 18  6 - 23 (mg/dL)    Creatinine, Ser 8.11  0.50 - 1.10 (mg/dL)    Calcium 8.9  8.4 -  10.5 (mg/dL)    Total Protein 6.9  6.0 - 8.3 (g/dL)    Albumin 3.1 (*) 3.5 - 5.2 (g/dL)    AST 14  0 - 37 (U/L)    ALT 10  0 - 35 (U/L)    Alkaline Phosphatase 51  39 - 117 (U/L)    Total Bilirubin 0.6  0.3 - 1.2 (mg/dL)    GFR calc non Af Amer 53 (*) >90 (mL/min)    GFR calc Af Amer 62 (*) >90 (mL/min)   CBC     Status: Abnormal   Collection Time   05/19/11  6:30 AM      Component Value Range Comment   WBC 1.5 (*) 4.0 - 10.5  (K/uL) REPEATED TO VERIFY   RBC 3.38 (*) 3.87 - 5.11 (MIL/uL)    Hemoglobin 10.6 (*) 12.0 - 15.0 (g/dL)    HCT 16.1 (*) 09.6 - 46.0 (%)    MCV 91.1  78.0 - 100.0 (fL)    MCH 31.4  26.0 - 34.0 (pg)    MCHC 34.4  30.0 - 36.0 (g/dL)    RDW 04.5  40.9 - 81.1 (%)    Platelets 127 (*) 150 - 400 (K/uL)    Dg Chest 2 View  05/18/2011  *RADIOLOGY REPORT*  Clinical Data: Shortness of breath.  CHEST - 2 VIEW  Comparison: 05/11/2009.  Findings: No infiltrate, congestive heart failure or pneumothorax. Calcified aorta.  Heart size within normal limits.  Thoracic kyphosis.  IMPRESSION: No infiltrate or congestive heart failure.  Original Report Authenticated By: Fuller Canada, M.D.     MEDICATIONS: reviewed.     Assessment/Plan:  1.  Neutropenic fever:   - No sign of sepsis. - CXR reviewed; and was negative.  - UA positive. - Pending blood and urine culture. - Agree with empiric antibiotic regimen of Vancomycin and imipenem given PCN allergy.  2.  Pancytopenia:   - From recent chemo. - Given neutropenic fever and her advanced age (which puts her at high risk), I start Neuopgen 480mg  SQ daily until ANC >1.5 - Follow daily CBC and consider transfusion for Hgb <7 or plt <10K or active bleeding. - Patient has history of severe myalgia/arthralgia from GCSF.  I therefore wrote for Vicodin prn bone pain.   3.  Ovarian cancer:   - She had 3 cycles of neoadjuvant taxol/carboplatin thru 03-10-11 followed by surgery 2-19 2013 by Dr.ClarkePearson for primary gyn carcinoma. She actually had pathologic CR at surgery. Surgery was exploratory laparotomy with TAH/BSO and omentectomy, without lymph node evaluation.   She was started on adjuvant chemo with  taxol/carboplatin on 05/12/2011.  She did have Neupogen 300 mcg from 05/13/11 through 05/16/11. I wanted to defer to Dr. Darrold Span re: future treatment plan. However, patient pressed me for my personal approach. I explained to her that if I were to treat her, I may  consider dose reduction of the last two cycles of Taxol/Carboplatin and switch from Neupogen to Neulasta with subsequent cycles.  However, I again defer to Dr. Precious Reel expertise.   4.  Diarrhea:  Resolved.

## 2011-05-19 NOTE — Progress Notes (Addendum)
ANTIBIOTIC CONSULT NOTE - INITIAL  Pharmacy Consult for Vancomycin, meropenem Indication: Fever, neutropenia, UTI  Allergies  Allergen Reactions  . Iodine     REACTION: hives  . Morphine And Related     Given after knee replacement and code blue occurred  . Penicillins     REACTION: hives  . Shellfish Allergy     Pt has shellfish allergy only.  Has had IV contrast x 2 and did fine.    Patient Measurements: Height: 5\' 4"  (162.6 cm) Weight: 163 lb 5.8 oz (74.1 kg) IBW/kg (Calculated) : 54.7  Adjusted Body Weight:   Vital Signs: Temp: 99.1 F (37.3 C) (04/08 0547) Temp src: Oral (04/08 0547) BP: 138/81 mmHg (04/08 0547) Pulse Rate: 88  (04/08 0547) Intake/Output from previous day: 04/07 0701 - 04/08 0700 In: -  Out: 50 [Urine:50] Intake/Output from this shift: Total I/O In: -  Out: 50 [Urine:50]  Labs:  [facility] 05/18/11 1919  WBC 1.2*  HGB 10.9*  PLT 124*  LABCREA --  CREATININE 1.01   Estimated Creatinine Clearance: 44.6 ml/min (by C-G formula based on Cr of 1.01). No results found for this basename: VANCOTROUGH:2,VANCOPEAK:2,VANCORANDOM:2,GENTTROUGH:2,GENTPEAK:2,GENTRANDOM:2,TOBRATROUGH:2,TOBRAPEAK:2,TOBRARND:2,AMIKACINPEAK:2,AMIKACINTROU:2,AMIKACIN:2, in the last 72 hours   Microbiology: Recent Results (from the past 720 hour(s))  URINE CULTURE     Status: Normal   Collection Time   04/21/11  1:24 PM      Component Value Range Status Comment   Urine Culture, Routine Culture, Urine   Final     Medical History: Past Medical History  Diagnosis Date  . Ascites   . Cystitis   . GERD (gastroesophageal reflux disease)   . Hypertension   . Hyponatremia   . Hyperlipidemia   . Osteopenia   . Postmenopausal atrophic vaginitis   . Vulvitis   . Staph infection 2010    after knee replacement  . Urinary frequency   . Complication of anesthesia     Sodium drops per pt   . Pneumonia     hx of   . Anemia     hx of   . Blood transfusion     hx of 2011     . H/O hiatal hernia   . Arthritis     knees   . Ovarian cancer 01/25/2011    Medications:  Anti-infectives     Start     Dose/Rate Route Frequency Ordered Stop   05/19/11 1100   vancomycin (VANCOCIN) 750 mg in sodium chloride 0.9 % 150 mL IVPB        750 mg 150 mL/hr over 60 Minutes Intravenous Every 12 hours 05/19/11 0554     05/18/11 2215   vancomycin (VANCOCIN) IVPB 1000 mg/200 mL premix        1,000 mg 200 mL/hr over 60 Minutes Intravenous  Once 05/18/11 2201 05/19/11 0002   05/18/11 2215   meropenem (MERREM) 500 mg in sodium chloride 0.9 % 50 mL IVPB        500 mg 100 mL/hr over 30 Minutes Intravenous 3 times per day 05/18/11 2201           Assessment: Patient with Neutopenic fever and UTI.  First dose of antibiotics already given in ED.    Goal of Therapy:  Vancomycin trough level 15-20 mcg/ml Meropenem dosed based on patient weight and renal function    Plan:  Measure antibiotic drug levels at steady state Follow up culture results Vancomycin 750mg  iv q12hr Continue meropenem as ordered  Darlina Guys, Jacquenette Shone Crowford  05/19/2011,5:54 AM  ADDENDUM: Renal fxn borderline, CrCl ~ 24ml/min. Will increase Meropenem to 1g IV q8h and follow CrCl closely.  Darrol Angel, PharmD Pager: [phone] 05/19/2011 2:30 PM

## 2011-05-19 NOTE — Progress Notes (Signed)
Subjective: Patient feeling much better. It no diarrhea. No nausea or vomiting since the emergency room.  Objective: Weight change:   Intake/Output Summary (Last 24 hours) at 05/19/11 1634 Last data filed at 05/19/11 1130  Gross per 24 hour  Intake   1205 ml  Output     50 ml  Net   1155 ml    Filed Vitals:   05/19/11 1534  BP: 130/81  Pulse: 95  Temp: 100.5 F (38.1 C)  Resp: 18   General: Alert and oriented x3, no acute distress HEENT: Normocephalic and atraumatic, mucous diminished slightly dry Cardiovascular: Regular rate and rhythm, S1-S2 Lungs: Clear to auscultation bilaterally Abdomen: Soft, nontender, nondistended, normoactive bowel sounds Extremities: No clubbing or cyanosis or edema  Lab Results: Basic Metabolic Panel:  Basename 05/19/11 1205 05/19/11 0630  NA 132* 132*  K 3.8 4.0  CL 100 99  CO2 24 23  GLUCOSE 109* 112*  BUN 16 18  CREATININE 0.94 0.98  CALCIUM 8.3* 8.9  MG -- --  PHOS -- --   Liver Function Tests:  Basename 05/19/11 0630 05/18/11 1919  AST 14 16  ALT 10 11  ALKPHOS 51 49  BILITOT 0.6 0.8  PROT 6.9 7.1  ALBUMIN 3.1* 3.4*   No results found for this basename: LIPASE:2,AMYLASE:2 in the last 72 hours No results found for this basename: AMMONIA:2 in the last 72 hours CBC:  Basename 05/19/11 0630 05/18/11 1919  WBC 1.5* 1.2*  NEUTROABS -- 0.1*  HGB 10.6* 10.9*  HCT 30.8* 31.6*  MCV 91.1 91.3  PLT 127* 124*   Cardiac Enzymes: No results found for this basename: CKTOTAL:3,CKMB:3,CKMBINDEX:3,TROPONINI:3 in the last 72 hours BNP: No results found for this basename: PROBNP:3 in the last 72 hours D-Dimer: No results found for this basename: DDIMER:2 in the last 72 hours CBG: No results found for this basename: GLUCAP:6 in the last 72 hours Hemoglobin A1C: No results found for this basename: HGBA1C in the last 72 hours Fasting Lipid Panel: No results found for this basename: CHOL,HDL,LDLCALC,TRIG,CHOLHDL,LDLDIRECT in the  last 72 hours Thyroid Function Tests:  Basename 05/19/11 0630  TSH 3.885  T4TOTAL --  FREET4 --  T3FREE --  THYROIDAB --   Anemia Panel: No results found for this basename: VITAMINB12,FOLATE,FERRITIN,TIBC,IRON,RETICCTPCT in the last 72 hours Coagulation: No results found for this basename: LABPROT:2,INR:2 in the last 72 hours Urine Drug Screen: Drugs of Abuse  No results found for this basename: labopia, cocainscrnur, labbenz, amphetmu, thcu, labbarb    Alcohol Level: No results found for this basename: ETH:2 in the last 72 hours Urinalysis:  Basename 05/18/11 2014  COLORURINE YELLOW  LABSPEC 1.015  PHURINE 6.0  GLUCOSEU NEGATIVE  HGBUR LARGE*  BILIRUBINUR NEGATIVE  KETONESUR NEGATIVE  PROTEINUR NEGATIVE  UROBILINOGEN 0.2  NITRITE NEGATIVE  LEUKOCYTESUR SMALL*    Studies/Results: Dg Chest 2 View  05/18/2011  .  IMPRESSION: No infiltrate or congestive heart failure.  Original Report Authenticated By: Fuller Canada, M.D.   Medications: Scheduled Meds:   . azelastine  1 spray Each Nare BID  . enoxaparin  40 mg Subcutaneous Q24H  . filgrastim  480 mcg Subcutaneous QPC supper  . lisinopril  10 mg Oral QPC breakfast  . metroNIDAZOLE  500 mg Oral Q8H  . nadolol  10 mg Oral QPC breakfast  . omeprazole  20 mg Oral Q1200  . ondansetron  4 mg Intravenous Once  . polyvinyl alcohol  1 drop Both Eyes QID  . sodium chloride  500 mL Intravenous Once  . sodium chloride  3 mL Intravenous Q12H  . vancomycin  1,000 mg Intravenous Once  . zolpidem  10 mg Oral QHS  . DISCONTD: meropenem (MERREM) IV  1 g Intravenous Q8H  . DISCONTD: meropenem (MERREM) IV  500 mg Intravenous Q8H  . DISCONTD: pantoprazole  40 mg Oral Daily  . DISCONTD: vancomycin  750 mg Intravenous Q12H   Continuous Infusions:   . sodium chloride 100 mL/hr at 05/19/11 0651   PRN Meds:.acetaminophen, acetaminophen, albuterol, alum & mag hydroxide-simeth, guaiFENesin-dextromethorphan,  HYDROcodone-acetaminophen, ondansetron (ZOFRAN) IV, ondansetron, DISCONTD: HYDROcodone-acetaminophen  Assessment/Plan: Patient Active Hospital Problem List: HYPERLIPIDEMIA (04/23/2009)  stable.  Unspecified essential hypertension (04/23/2009)  blood pressure stable.  GERD (04/23/2009)  asymptomatic. Continue PPI. Please note that she does indeed have C. difficile, will likely need to discontinue PPI.  Ovarian cancer (01/25/2011)  oncology following. Possible dose reduction of her last 2 doses of chemotherapy.  Diarrhea (05/18/2011)  concerned this could be infectious source. Awaiting for C. difficile cultures. See below.  Neutropenia with fever (05/18/2011)  actually pancytopenia. Receiving Neupogen. As far source crit, this is not a UTI or pneumonia. She's been on broad-spectrum antibiotics for the last 24 hours and had a low-grade temperature of 100.5 in the last hour. I suspect that the only symptom source that she does have his diarrhea and so therefore we discontinue IV antibiotics and try by mouth Flagyl. If she spikes a temperature on this, we'll consult infectious disease.   LOS: 1 day   [name] 05/19/2011, 4:34 PM

## 2011-05-19 NOTE — ED Notes (Signed)
Patient is being transferred to 4th floor-  Gave report to [place] , [place]

## 2011-05-19 NOTE — Progress Notes (Signed)
   CARE MANAGEMENT NOTE 05/19/2011  Patient:  [name], [name]   Account Number:  [id number]  Date Initiated:  05/19/2011  Documentation initiated by:  Lanier Clam  Subjective/Objective Assessment:   ADMITTED W/DIARRHEA.WU:[id number] CA.     Action/Plan:   FROM HOME W/SPOUSE.   Anticipated DC Date:  05/20/2011   Anticipated DC Plan:  HOME/SELF CARE         Choice offered to / List presented to:             Status of service:  In process, will continue to follow Medicare Important Message given?   (If response is "NO", the following Medicare IM given date fields will be blank) Date Medicare IM given:   Date Additional Medicare IM given:    Discharge Disposition:    Per UR Regulation:  Reviewed for med. necessity/level of care/duration of stay  If discussed at Long Length of Stay Meetings, dates discussed:    Comments:  05/19/11 Nylan Nevel RN,BSN NCM [phone] ONCOLOGY FOLLOWING

## 2011-05-19 NOTE — ED Notes (Signed)
Patient is threatening to leave and go home. Upset that have not been assigned a room . Making her aware of the hold up - will follow up with doctor.

## 2011-05-20 ENCOUNTER — Other Ambulatory Visit: Payer: Medicare Other | Admitting: Lab

## 2011-05-20 ENCOUNTER — Encounter: Payer: Medicare Other | Admitting: Oncology

## 2011-05-20 ENCOUNTER — Telehealth: Payer: Self-pay | Admitting: Oncology

## 2011-05-20 DIAGNOSIS — C569 Malignant neoplasm of unspecified ovary: Secondary | ICD-10-CM | POA: Diagnosis not present

## 2011-05-20 DIAGNOSIS — R5381 Other malaise: Secondary | ICD-10-CM | POA: Diagnosis not present

## 2011-05-20 DIAGNOSIS — D709 Neutropenia, unspecified: Secondary | ICD-10-CM | POA: Diagnosis not present

## 2011-05-20 DIAGNOSIS — R5081 Fever presenting with conditions classified elsewhere: Secondary | ICD-10-CM | POA: Diagnosis not present

## 2011-05-20 DIAGNOSIS — E871 Hypo-osmolality and hyponatremia: Secondary | ICD-10-CM | POA: Diagnosis not present

## 2011-05-20 DIAGNOSIS — R197 Diarrhea, unspecified: Secondary | ICD-10-CM | POA: Diagnosis not present

## 2011-05-20 DIAGNOSIS — R509 Fever, unspecified: Secondary | ICD-10-CM | POA: Diagnosis not present

## 2011-05-20 LAB — DIFFERENTIAL
Basophils Absolute: 0 10*3/uL (ref 0.0–0.1)
Basophils Relative: 0 % (ref 0–1)
Eosinophils Absolute: 0 10*3/uL (ref 0.0–0.7)
Eosinophils Relative: 2 % (ref 0–5)
Lymphocytes Relative: 25 % (ref 12–46)
Lymphs Abs: 0.6 10*3/uL — ABNORMAL LOW (ref 0.7–4.0)
Monocytes Absolute: 0.8 10*3/uL (ref 0.1–1.0)
Monocytes Relative: 36 % — ABNORMAL HIGH (ref 3–12)
Neutro Abs: 0.8 10*3/uL — ABNORMAL LOW (ref 1.7–7.7)
Neutrophils Relative %: 37 % — ABNORMAL LOW (ref 43–77)
Smear Review: ADEQUATE

## 2011-05-20 LAB — URINE CULTURE
Colony Count: 10000
Culture  Setup Time: 201304080214

## 2011-05-20 LAB — COMPREHENSIVE METABOLIC PANEL
ALT: 12 U/L (ref 0–35)
AST: 23 U/L (ref 0–37)
Albumin: 2.8 g/dL — ABNORMAL LOW (ref 3.5–5.2)
Alkaline Phosphatase: 52 U/L (ref 39–117)
BUN: 13 mg/dL (ref 6–23)
CO2: 21 mEq/L (ref 19–32)
Calcium: 8.8 mg/dL (ref 8.4–10.5)
Chloride: 100 mEq/L (ref 96–112)
Creatinine, Ser: 0.86 mg/dL (ref 0.50–1.10)
GFR calc Af Amer: 73 mL/min — ABNORMAL LOW (ref >90)
GFR calc non Af Amer: 63 mL/min — ABNORMAL LOW (ref >90)
Glucose, Bld: 121 mg/dL — ABNORMAL HIGH (ref 70–99)
Potassium: 4.1 mEq/L (ref 3.5–5.1)
Sodium: 131 mEq/L — ABNORMAL LOW (ref 135–145)
Total Bilirubin: 0.3 mg/dL (ref 0.3–1.2)
Total Protein: 6.5 g/dL (ref 6.0–8.3)

## 2011-05-20 LAB — CBC
HCT: 29 % — ABNORMAL LOW (ref 36.0–46.0)
Hemoglobin: 10 g/dL — ABNORMAL LOW (ref 12.0–15.0)
MCH: 31.5 pg (ref 26.0–34.0)
MCHC: 34.5 g/dL (ref 30.0–36.0)
MCV: 91.5 fL (ref 78.0–100.0)
Platelets: ADEQUATE 10*3/uL (ref 150–400)
RBC: 3.17 MIL/uL — ABNORMAL LOW (ref 3.87–5.11)
RDW: 14.3 % (ref 11.5–15.5)
WBC: 2.2 10*3/uL — ABNORMAL LOW (ref 4.0–10.5)

## 2011-05-20 LAB — BASIC METABOLIC PANEL
BUN: 14 mg/dL (ref 6–23)
CO2: 22 mEq/L (ref 19–32)
Calcium: 8.4 mg/dL (ref 8.4–10.5)
Chloride: 98 mEq/L (ref 96–112)
Creatinine, Ser: 0.92 mg/dL (ref 0.50–1.10)
GFR calc Af Amer: 67 mL/min — ABNORMAL LOW (ref >90)
GFR calc non Af Amer: 58 mL/min — ABNORMAL LOW (ref >90)
Glucose, Bld: 101 mg/dL — ABNORMAL HIGH (ref 70–99)
Potassium: 3.6 mEq/L (ref 3.5–5.1)
Sodium: 127 mEq/L — ABNORMAL LOW (ref 135–145)

## 2011-05-20 LAB — MAGNESIUM
Magnesium: 1 mg/dL — ABNORMAL LOW (ref 1.5–2.5)
Magnesium: 1.1 mg/dL — ABNORMAL LOW (ref 1.5–2.5)

## 2011-05-20 LAB — OSMOLALITY, URINE: Osmolality, Ur: 481 mOsm/kg (ref 390–1090)

## 2011-05-20 LAB — PHOSPHORUS: Phosphorus: 2.1 mg/dL — ABNORMAL LOW (ref 2.3–4.6)

## 2011-05-20 LAB — CLOSTRIDIUM DIFFICILE BY PCR: Toxigenic C. Difficile by PCR: NEGATIVE

## 2011-05-20 LAB — SODIUM, URINE, RANDOM: Sodium, Ur: 131 mEq/L

## 2011-05-20 MED ORDER — OXYCODONE HCL 5 MG PO TABS: 5.0000 mg | ORAL_TABLET | ORAL | Status: DC | PRN

## 2011-05-20 MED ORDER — FILGRASTIM 300 MCG/ML IJ SOLN
300.0000 ug | Freq: Once | INTRAMUSCULAR | Status: AC
  Administered 2011-05-20: 300 ug via SUBCUTANEOUS
  Filled 2011-05-20: qty 1

## 2011-05-20 NOTE — Progress Notes (Signed)
Patient's peripheral IV came out, refused to have another IV line and refused to have anymore IVF. Patient is stable. Dr. Kaylyn Layer was made aware.

## 2011-05-20 NOTE — Discharge Summary (Signed)
DISCHARGE SUMMARY  [name]  MR#: [id number]  DOB:1929-08-28  Date of Admission: 05/18/2011 Anticipated Date of Discharge: 05/21/2011  Attending Physician:Noelle Sease K  Patient's ION:GEXBMW,UXLKG Juel Burrow, MD, MD  Consults:Treatment Team:  Reece Packer, MD-oncology  Discharge Diagnoses: Present on Admission:  .Ovarian cancer .Diarrhea .Neutropenia with fever .HYPERLIPIDEMIA .GERD .Unspecified essential hypertension   Initial presentation: Patient is a 78 year old white female with past medical history of ovarian cancer status post surgery and chemotherapy who was admitted on 4/7 to the hospitalist service for documented fevers of slightly lower than 101 and episodes of diarrhea.  Hospital Course: Patient Active Problem List  Diagnoses     . HYPERLIPIDEMIA: Stable during this hospitalization.   Marland Kitchen Unspecified essential hypertension: Stable during his hospitalization.   Marland Kitchen GERD: Stable. Continue PPI.      Marland Kitchen OSTEOARTHRITIS: Stable. Continued on when necessary Tylenol.   . Ovarian cancer: Being followed by oncology. Patient is status post chemotherapy and then underwent exploratory laparotomy with TAH/BSO and omentectomy and then secondary adjuvant chemotherapy which was started on 4/1. Patient did have Neupogen given from 4/2-4/5      . Diarrhea: No further episodes since admission. Oncology recommending if no fevers eye discharge, patient does not need to go home on Flagyl.   Marland Kitchen Neutropenia with fever: Prior to admission, patient noted that she had been having fevers over the weekend. Oncology advised to come into the emergency room for further evaluation. Patient also had some episodes of diarrhea. In the emergency room she was noted to have a temperature as high as 100.6. Urine analysis and chest x-ray were unremarkable. Patient had no further diarrhea since admission. She was started on broad-spectrum antibiotics and the following day, did spike a low-grade  temperature of 100.6. Given her diarrhea symptoms, I suspected possible intestinal infection and discontinued IV antibiotics and started the patient on by mouth Flagyl. The patient herself initially was reluctant to take the Flagyl and finally took her first dose on 4/7. Oncology has been following and gave the patient doses of Neupogen. As of 4/9, the patient's white blood cell count has improved to 2.2 but her absolute neutrophil count is almost above 1 at 0.8. She likely after receiving one more dose of Neupogen, will be within a safe range tomorrow, 4/10. She's had no fevers low grade or otherwise, since her IV antibiotics were discontinued.    Hyponatremia: Came down to 127 today. Patient has had previous problems with hyponatremia in the past. She's asymptomatic. Recheck levels the morning.  Medication List  As of 05/20/2011  2:10 PM   TAKE these medications         acetaminophen 325 MG tablet   Commonly known as: TYLENOL   Take 650 mg by mouth every 4 (four) hours as needed. Pain        azelastine 137 MCG/SPRAY nasal spray   Commonly known as: ASTELIN   Place 1 spray into the nose 2 (two) times daily. Use in each nostril as directed      CENTRUM SILVER PO   Take 1 tablet by mouth daily.      CITRACAL + D PO   Take 2 tablets by mouth daily.      dexamethasone 4 MG tablet   Commonly known as: DECADRON   Take 20 mg by mouth See admin instructions. TAKE 5 TABS= 20 MG 12 HRS. AND 6 HRS. PRIOR TO CHEMOTHERAPY.  EACH DOSE WITH FOOD.      esomeprazole 40 MG capsule  Commonly known as: NEXIUM   Take 40 mg by mouth daily before breakfast.      ibuprofen 200 MG tablet   Commonly known as: ADVIL,MOTRIN   Take 200 mg by mouth every 6 (six) hours as needed. pain      lisinopril 10 MG tablet   Commonly known as: PRINIVIL,ZESTRIL   Take 10 mg by mouth daily after breakfast.      LOVAZA 1 G capsule   Generic drug: omega-3 acid ethyl esters   Take 1 g by mouth daily.      LUCENTIS IO     Place into the left eye once as needed. Pt receive from dr. Luciana Axe office [phone]      nadolol 20 MG tablet   Commonly known as: CORGARD   Take 10 mg by mouth daily after breakfast.      nystatin cream   Commonly known as: MYCOSTATIN   Apply 1 application topically 3 (three) times daily as needed. Applies to Outside of vagina and anus for Itching        ondansetron 8 MG tablet   Commonly known as: ZOFRAN   TAKE 1-2 TABLETS EVERY 12-24 HRS. AS NEEDED FOR NAUSEA.  TAKE 1 TABLET IN THE MORNING AFTER CHEMOTHERAPY WHETHER OR NOT NAUSEA      oxyCODONE 5 MG immediate release tablet   Commonly known as: Oxy IR/ROXICODONE   Take 1 tablet (5 mg total) by mouth every 3 (three) hours as needed.      SINUS RINSE BOTTLE KIT NA   Place 1 spray into the nose 2 (two) times daily as needed. Allergies        SYSTANE 0.4-0.3 % Soln   Generic drug: Polyethyl Glycol-Propyl Glycol   Place 1 drop into both eyes 4 (four) times daily.      Vitamin D 2000 UNITS tablet   Take 2,000 Units by mouth daily.      vitamin E 400 UNIT capsule   Generic drug: vitamin E   Take 400 Units by mouth daily.      zolpidem 10 MG tablet   Commonly known as: AMBIEN   Take 10 mg by mouth at bedtime.           Physical exam BP 143/78  Pulse 74  Temp(Src) 97.7 F (36.5 C) (Axillary)  Resp 20  Ht 5\' 4"  (1.626 m)  Wt 74.1 kg (163 lb 5.8 oz)  BMI 28.04 kg/m2  SpO2 100%  Physical Exam: General: Alert and oriented x3, no acute distress, fatigued HEENT: Normocephalic, atraumatic mucous membranes are slightly dry Cardiovascular: Regular rate and rhythm, S1-S2 Lungs: Clear to auscultation bilaterally Abdomen: Soft, nontender, nondistended, positive bowel sounds Extremities: No clubbing or cyanosis or edema.  Results for orders placed during the hospital encounter of 05/18/11 (from the past 24 hour(s))  BASIC METABOLIC PANEL     Status: Abnormal   Collection Time   05/19/11  7:00 PM      Component  Value Range   Sodium 131 (*) 135 - 145 (mEq/L)   Potassium 4.2  3.5 - 5.1 (mEq/L)   Chloride 98  96 - 112 (mEq/L)   CO2 22  19 - 32 (mEq/L)   Glucose, Bld 126 (*) 70 - 99 (mg/dL)   BUN 14  6 - 23 (mg/dL)   Creatinine, Ser 0.98  0.50 - 1.10 (mg/dL)   Calcium 8.7  8.4 - 11.9 (mg/dL)   GFR calc non Af Amer 58 (*) >90 (mL/min)  GFR calc Af Amer 67 (*) >90 (mL/min)  OSMOLALITY, URINE     Status: Normal   Collection Time   05/19/11  7:34 PM      Component Value Range   Osmolality, Ur 481  390 - 1090 (mOsm/kg)  SODIUM, URINE, RANDOM     Status: Normal   Collection Time   05/19/11  7:34 PM      Component Value Range   Sodium, Ur 131    MAGNESIUM     Status: Abnormal   Collection Time   05/19/11 11:40 PM      Component Value Range   Magnesium 1.0 (*) 1.5 - 2.5 (mg/dL)  PHOSPHORUS     Status: Abnormal   Collection Time   05/19/11 11:40 PM      Component Value Range   Phosphorus 2.1 (*) 2.3 - 4.6 (mg/dL)  BASIC METABOLIC PANEL     Status: Abnormal   Collection Time   05/19/11 11:40 PM      Component Value Range   Sodium 127 (*) 135 - 145 (mEq/L)   Potassium 3.6  3.5 - 5.1 (mEq/L)   Chloride 98  96 - 112 (mEq/L)   CO2 22  19 - 32 (mEq/L)   Glucose, Bld 101 (*) 70 - 99 (mg/dL)   BUN 14  6 - 23 (mg/dL)   Creatinine, Ser 1.61  0.50 - 1.10 (mg/dL)   Calcium 8.4  8.4 - 09.6 (mg/dL)   GFR calc non Af Amer 58 (*) >90 (mL/min)   GFR calc Af Amer 67 (*) >90 (mL/min)  CLOSTRIDIUM DIFFICILE BY PCR     Status: Normal   Collection Time   05/20/11  9:31 AM      Component Value Range   C difficile by pcr NEGATIVE  NEGATIVE   CBC     Status: Abnormal   Collection Time   05/20/11  9:40 AM      Component Value Range   WBC 2.2 (*) 4.0 - 10.5 (K/uL)   RBC 3.17 (*) 3.87 - 5.11 (MIL/uL)   Hemoglobin 10.0 (*) 12.0 - 15.0 (g/dL)   HCT 04.5 (*) 40.9 - 46.0 (%)   MCV 91.5  78.0 - 100.0 (fL)   MCH 31.5  26.0 - 34.0 (pg)   MCHC 34.5  30.0 - 36.0 (g/dL)   RDW 81.1  91.4 - 78.2 (%)   Platelets    150 - 400  (K/uL)   Value: PLATELET CLUMPS NOTED ON SMEAR, COUNT APPEARS ADEQUATE  DIFFERENTIAL     Status: Abnormal   Collection Time   05/20/11  9:40 AM      Component Value Range   Neutrophils Relative 37 (*) 43 - 77 (%)   Lymphocytes Relative 25  12 - 46 (%)   Monocytes Relative 36 (*) 3 - 12 (%)   Eosinophils Relative 2  0 - 5 (%)   Basophils Relative 0  0 - 1 (%)   Neutro Abs 0.8 (*) 1.7 - 7.7 (K/uL)   Lymphs Abs 0.6 (*) 0.7 - 4.0 (K/uL)   Monocytes Absolute 0.8  0.1 - 1.0 (K/uL)   Eosinophils Absolute 0.0  0.0 - 0.7 (K/uL)   Basophils Absolute 0.0  0.0 - 0.1 (K/uL)   WBC Morphology MILD LEFT SHIFT (1-5% METAS, OCC MYELO, OCC BANDS)     Smear Review       Value: PLATELET CLUMPS NOTED ON SMEAR, COUNT APPEARS ADEQUATE  COMPREHENSIVE METABOLIC PANEL     Status: Abnormal  Collection Time   05/20/11  9:40 AM      Component Value Range   Sodium 131 (*) 135 - 145 (mEq/L)   Potassium 4.1  3.5 - 5.1 (mEq/L)   Chloride 100  96 - 112 (mEq/L)   CO2 21  19 - 32 (mEq/L)   Glucose, Bld 121 (*) 70 - 99 (mg/dL)   BUN 13  6 - 23 (mg/dL)   Creatinine, Ser 1.61  0.50 - 1.10 (mg/dL)   Calcium 8.8  8.4 - 09.6 (mg/dL)   Total Protein 6.5  6.0 - 8.3 (g/dL)   Albumin 2.8 (*) 3.5 - 5.2 (g/dL)   AST 23  0 - 37 (U/L)   ALT 12  0 - 35 (U/L)   Alkaline Phosphatase 52  39 - 117 (U/L)   Total Bilirubin 0.3  0.3 - 1.2 (mg/dL)   GFR calc non Af Amer 63 (*) >90 (mL/min)   GFR calc Af Amer 73 (*) >90 (mL/min)  MAGNESIUM     Status: Abnormal   Collection Time   05/20/11  9:40 AM      Component Value Range   Magnesium 1.1 (*) 1.5 - 2.5 (mg/dL)    Disposition: Improved, being discharged home   Follow-up Appts: Discharge Orders    Future Orders Please Complete By Expires   Diet - low sodium heart healthy      Increase activity slowly         Follow-up Information    Follow up with Mickie Hillier, MD .        patient will followup with oncology next week  Tests Needing Follow-up: None  Time spent  in discharge (includes decision making & examination of pt): 50 minutes  Signed: Hollice Espy 05/20/2011, 2:10 PM  Third up on a

## 2011-05-20 NOTE — Progress Notes (Signed)
  05/20/2011, 8:29 AM  Hospital day: 3 Antibiotics: flagyl ordered, which patient has refused thus far Chemotherapy: taxol/carboplatin 05-12-2011  with neupogen x 4 days beginning 4-2. Patient refused additional neupogen 4-8 per EMR. On enteric isolation Subjective: Awake, alert "feeling much better". Last loose stool was 4-7, with 1 formed stool 4-8 and none so far today. Walked in hall this am and ate full breakfast with good appetite. No chills, temperature down. IV has come out and she is refusing restart. Labs not drawn yet this am and are ordered now.  Objective: Vital signs in last 24 hours: Blood pressure 126/77, pulse 92, temperature 96.7 F (35.9 C), temperature source Axillary, resp. rate 24, height 5\' 4"  (1.626 m), weight 163 lb 5.8 oz (74.1 kg), SpO2 98.00%. Tmax 100.5 on 4-8 @ 1530, afebrile since then. Alopecia. Speech fluent, looks comfortable seated in bed on RA. PERRL, not icteric. Cor RRR. Lungs without wheezes or rales. Abdomen soft and nontender, not distended, few bowel sounds. LE no edema or cords. Moves easily in bed. Ecchymoses UE at venous access sites. No IV access now. Intake/Output from previous day: 04/08 0701 - 04/09 0700 In: 1155 [P.O.:540; I.V.:465; IV Piggyback:150] Out: 500 [Urine:500] Intake/Output this shift: Total I/O In: 120 [P.O.:120] Out: -     Lab Results: ordered and pending now: CBC diff,cmet, Mg++  Basename 05/19/11 0630 05/18/11 1919  WBC 1.5* 1.2*  HGB 10.6* 10.9*  HCT 30.8* 31.6*  PLT 127* 124*   BMET  Basename 05/19/11 2340 05/19/11 1900  NA 127* 131*  K 3.6 4.2  CL 98 98  CO2 22 22  GLUCOSE 101* 126*  BUN 14 14  CREATININE 0.92 0.92  CALCIUM 8.4 8.7   Urine culture 4-7 with multiple species. Blood cultures and possible another urine cx pending. No specimen for C diff collected yet. Studies/Results: Dg Chest 2 View  05/18/2011  *RADIOLOGY REPORT*  Clinical Data: Shortness of breath.  CHEST - 2 VIEW  Comparison:  05/11/2009.  Findings: No infiltrate, congestive heart failure or pneumothorax. Calcified aorta.  Heart size within normal limits.  Thoracic kyphosis.  IMPRESSION: No infiltrate or congestive heart failure.  Original Report Authenticated By: Fuller Canada, M.D.    Husband has just arrived and I have spoken with him also. Assessment/Plan: 1.Neutropenic fever: no source identified. If still neutropenic by counts this am, I will give further neupogen. When ANC >=1.0 if cultures negative and otherwise stable ok to DC antibiotics and can DC home at that point. 2.Diarrhea day of admission: improved now. 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 Plan is to complete total 6 cycles of the taxol/carboplatin, with chemo resumed on 05-12-2011. She was neutropenic on admission despite 4 days of neupogen beginning 4-2.  I will see her daily while in hospital and will see her back at office prior to another chemotherapy treatment.  Appreciate excellent care by hospitalist service and assistance from Dr.Ha in my absence yesterday.  Haillee Johann P

## 2011-05-20 NOTE — Telephone Encounter (Signed)
Called pt in hosp room and gv her appt for 4/16 @ 9:30 am.

## 2011-05-21 DIAGNOSIS — R5383 Other fatigue: Secondary | ICD-10-CM | POA: Diagnosis not present

## 2011-05-21 DIAGNOSIS — R5081 Fever presenting with conditions classified elsewhere: Secondary | ICD-10-CM | POA: Diagnosis not present

## 2011-05-21 DIAGNOSIS — D709 Neutropenia, unspecified: Secondary | ICD-10-CM | POA: Diagnosis not present

## 2011-05-21 DIAGNOSIS — R197 Diarrhea, unspecified: Secondary | ICD-10-CM | POA: Diagnosis not present

## 2011-05-21 DIAGNOSIS — C569 Malignant neoplasm of unspecified ovary: Secondary | ICD-10-CM | POA: Diagnosis not present

## 2011-05-21 DIAGNOSIS — E871 Hypo-osmolality and hyponatremia: Secondary | ICD-10-CM | POA: Diagnosis not present

## 2011-05-21 DIAGNOSIS — R5381 Other malaise: Secondary | ICD-10-CM | POA: Diagnosis not present

## 2011-05-21 DIAGNOSIS — R509 Fever, unspecified: Secondary | ICD-10-CM | POA: Diagnosis not present

## 2011-05-21 LAB — CBC
HCT: 26.3 % — ABNORMAL LOW (ref 36.0–46.0)
Hemoglobin: 9.2 g/dL — ABNORMAL LOW (ref 12.0–15.0)
MCH: 31.7 pg (ref 26.0–34.0)
MCHC: 35 g/dL (ref 30.0–36.0)
MCV: 90.7 fL (ref 78.0–100.0)
Platelets: 147 10*3/uL — ABNORMAL LOW (ref 150–400)
RBC: 2.9 MIL/uL — ABNORMAL LOW (ref 3.87–5.11)
RDW: 14.2 % (ref 11.5–15.5)
WBC: 10.3 10*3/uL (ref 4.0–10.5)

## 2011-05-21 LAB — DIFFERENTIAL
Basophils Absolute: 0 10*3/uL (ref 0.0–0.1)
Basophils Relative: 0 % (ref 0–1)
Eosinophils Absolute: 0.1 10*3/uL (ref 0.0–0.7)
Eosinophils Relative: 1 % (ref 0–5)
Lymphocytes Relative: 10 % — ABNORMAL LOW (ref 12–46)
Lymphs Abs: 1 10*3/uL (ref 0.7–4.0)
Monocytes Absolute: 1.2 10*3/uL — ABNORMAL HIGH (ref 0.1–1.0)
Monocytes Relative: 12 % (ref 3–12)
Neutro Abs: 8 10*3/uL — ABNORMAL HIGH (ref 1.7–7.7)
Neutrophils Relative %: 77 % (ref 43–77)

## 2011-05-21 MED ORDER — MAGNESIUM OXIDE 400 MG PO TABS: 400.0000 mg | ORAL_TABLET | Freq: Two times a day (BID) | ORAL | Status: DC

## 2011-05-21 MED ORDER — OXYCODONE HCL 5 MG PO TABS: 5.0000 mg | ORAL_TABLET | ORAL | Status: AC | PRN

## 2011-05-21 NOTE — Progress Notes (Signed)
Subjective: Patient feeling much better. No diarrhea nl BM. No nausea or vomiting. C/O HA. Wants to go home.  Objective: Weight change:   Intake/Output Summary (Last 24 hours) at 05/21/11 1050 Last data filed at 05/21/11 0800  Gross per 24 hour  Intake    480 ml  Output      0 ml  Net    480 ml    Filed Vitals:   05/21/11 0941  BP: 130/82  Pulse:   Temp:   Resp:    General: Alert and oriented x3, no acute distress HEENT: Normocephalic and atraumatic, mucous diminished slightly dry Cardiovascular: Regular rate and rhythm, S1-S2 Lungs: Clear to auscultation bilaterally Abdomen: Soft, nontender, nondistended, normoactive bowel sounds Extremities: No clubbing or cyanosis or edema  Lab Results: Basic Metabolic Panel:  Basename 05/20/11 0940 05/19/11 2340  NA 131* 127*  K 4.1 3.6  CL 100 98  CO2 21 22  GLUCOSE 121* 101*  BUN 13 14  CREATININE 0.86 0.92  CALCIUM 8.8 8.4  MG 1.1* 1.0*  PHOS -- 2.1*   Liver Function Tests:  Basename 05/20/11 0940 05/19/11 0630  AST 23 14  ALT 12 10  ALKPHOS 52 51  BILITOT 0.3 0.6  PROT 6.5 6.9  ALBUMIN 2.8* 3.1*   No results found for this basename: LIPASE:2,AMYLASE:2 in the last 72 hours No results found for this basename: AMMONIA:2 in the last 72 hours CBC:  Basename 05/21/11 0435 05/20/11 0940  WBC 10.3 2.2*  NEUTROABS 8.0* 0.8*  HGB 9.2* 10.0*  HCT 26.3* 29.0*  MCV 90.7 91.5  PLT 147* PLATELET CLUMPS NOTED ON SMEAR, COUNT APPEARS ADEQUATE   Cardiac Enzymes: No results found for this basename: CKTOTAL:3,CKMB:3,CKMBINDEX:3,TROPONINI:3 in the last 72 hours BNP: No results found for this basename: PROBNP:3 in the last 72 hours D-Dimer: No results found for this basename: DDIMER:2 in the last 72 hours CBG: No results found for this basename: GLUCAP:6 in the last 72 hours Hemoglobin A1C: No results found for this basename: HGBA1C in the last 72 hours Fasting Lipid Panel: No results found for this basename:  CHOL,HDL,LDLCALC,TRIG,CHOLHDL,LDLDIRECT in the last 72 hours Thyroid Function Tests:  Basename 05/19/11 0630  TSH 3.885  T4TOTAL --  FREET4 --  T3FREE --  THYROIDAB --   Anemia Panel: No results found for this basename: VITAMINB12,FOLATE,FERRITIN,TIBC,IRON,RETICCTPCT in the last 72 hours Coagulation: No results found for this basename: LABPROT:2,INR:2 in the last 72 hours Urine Drug Screen: Drugs of Abuse  No results found for this basename: labopia,  cocainscrnur,  labbenz,  amphetmu,  thcu,  labbarb    Alcohol Level: No results found for this basename: ETH:2 in the last 72 hours Urinalysis:  Basename 05/18/11 2014  COLORURINE YELLOW  LABSPEC 1.015  PHURINE 6.0  GLUCOSEU NEGATIVE  HGBUR LARGE*  BILIRUBINUR NEGATIVE  KETONESUR NEGATIVE  PROTEINUR NEGATIVE  UROBILINOGEN 0.2  NITRITE NEGATIVE  LEUKOCYTESUR SMALL*    Studies/Results: Dg Chest 2 View  05/18/2011  .  IMPRESSION: No infiltrate or congestive heart failure.  Original Report Authenticated By: Fuller Canada, M.D.   Medications: Scheduled Meds:    . azelastine  1 spray Each Nare BID  . B-complex with vitamin C  1 tablet Oral Daily  . cholecalciferol  2,000 Units Oral Daily  . enoxaparin  40 mg Subcutaneous Q24H  . filgrastim  300 mcg Subcutaneous Once  . lisinopril  10 mg Oral QPC breakfast  . nadolol  10 mg Oral QPC breakfast  . omega-3 acid ethyl  esters  1 g Oral Daily  . omeprazole  20 mg Oral Q1200  . ondansetron  4 mg Intravenous Once  . polyvinyl alcohol  1 drop Both Eyes QID  . sodium chloride  3 mL Intravenous Q12H  . vitamin E  400 Units Oral Daily  . zolpidem  10 mg Oral QHS  . DISCONTD: filgrastim  480 mcg Subcutaneous QPC supper  . DISCONTD: metroNIDAZOLE  500 mg Oral Q8H   Continuous Infusions:  PRN Meds:.acetaminophen, acetaminophen, albuterol, alum & mag hydroxide-simeth, guaiFENesin-dextromethorphan, ondansetron (ZOFRAN) IV, ondansetron, oxyCODONE  Assessment/Plan: Patient Active  Hospital Problem List: HYPERLIPIDEMIA (04/23/2009)  stable.  Unspecified essential hypertension (04/23/2009)  blood pressure stable.  GERD (04/23/2009)  asymptomatic. Continue PPI.   Ovarian cancer (01/25/2011)  oncology following. Possible dose reduction of her last 2 doses of chemotherapy.Patient s/p neupogen with increase in counts.  Diarrhea (05/18/2011)  Cdiff PCR negative. Flagyl discontinued. Nl BM.  Neutropenia with fever (05/18/2011)  actually pancytopenia. S/p Neupogen. As far source crit, this is not a UTI or pneumonia. Cdiff PCR negative. Afebrile. Increase in counts. F/u WITH ONCOLOGY AS OUTPATIENT. Dispo: D/C home.   LOS: 3 days   THOMPSON,DANIEL 05/21/2011, 10:50 AM

## 2011-05-21 NOTE — Progress Notes (Signed)
  05/21/2011, 8:09 AM  Hospital day:4 Antibiotics: po flagyl DC'd with negative Cdiff Chemotherapy: cycle 4 taxol/carboplatin 05-12-2011 Neupogen x4 days beginning 4-2,  + one dose 05-20-2011   Subjective: Feeling much better than on admission. No further diarrhea, minimal discomfort from neupogen controlled with tylenol. Has eaten breakfast and up in room easily. Hopes to go home today. Is aware of follow up appointment with me + lab 05-27-2011.  Objective: Vital signs in last 24 hours: Blood pressure 131/83, pulse 83, temperature 98.8 F (37.1 C), temperature source Oral, resp. rate 20, height 5\' 4"  (1.626 m), weight 163 lb 5.8 oz (74.1 kg), SpO2 98.00%.Awake, alert, looks comfortable sitting in bed on RA Mouth moist. PERRL. No JVD. Lungs without wheezes or rales. Abdomen soft, nontender, not distended, normal bowel sounds. LE no edema, cords. Moves easily in bed.  Intake/Output from previous day: 1155/500 Intake/Output this shift:   Lab Results:  Basename 05/21/11 0435 05/20/11 0940  WBC 10.3 2.2*  HGB 9.2* 10.0*  HCT 26.3* 29.0*  PLT 147* PLATELET CLUMPS NOTED ON SMEAR, COUNT APPEARS ADEQUATE  ANC 8.0 BMET  Basename 05/20/11 0940 05/19/11 2340  NA 131* 127*  K 4.1 3.6  CL 100 98  CO2 21 22  GLUCOSE 121* 101*  BUN 13 14  CREATININE 0.86 0.92  CALCIUM 8.8 8.4  magnesium still low on 4-9 at 1.1 C diff negative. I do not see any + cultures, as best I can tell in EMR. Studies/Results: No results found.   Assessment/Plan: 1.Neutropenic fever: counts up well with additional neupogen which patient agreed to take 05-20-11, afebrile, diarrhea resolved and apparently no positive cultures. OK from this standpoint for DC home today and I will see her back in office on 05-27-2011. 2.Mild hyponatremia, improved. Note history of SIADH after prior surgeries. 3. Low Mg++ with K+ good.  May need to try some oral Mg++ as long as this does not cause more diarrhea. 4. Gyn carcinoma: CR at  surgery in Nov after 3 neoadjuvant cycles of taxol/carbo, planning 2 further cycles  I have spoken with husband here now. Thank you! Crysta Gulick P

## 2011-05-23 ENCOUNTER — Other Ambulatory Visit: Payer: Self-pay | Admitting: Oncology

## 2011-05-23 DIAGNOSIS — C569 Malignant neoplasm of unspecified ovary: Secondary | ICD-10-CM

## 2011-05-25 LAB — CULTURE, BLOOD (ROUTINE X 2)
Culture  Setup Time: 201304080148
Culture  Setup Time: 201304080148
Culture: NO GROWTH
Culture: NO GROWTH

## 2011-05-27 ENCOUNTER — Ambulatory Visit (HOSPITAL_BASED_OUTPATIENT_CLINIC_OR_DEPARTMENT_OTHER): Payer: Medicare Other | Admitting: Oncology

## 2011-05-27 ENCOUNTER — Other Ambulatory Visit (HOSPITAL_BASED_OUTPATIENT_CLINIC_OR_DEPARTMENT_OTHER): Payer: Medicare Other | Admitting: Lab

## 2011-05-27 ENCOUNTER — Other Ambulatory Visit: Payer: Self-pay | Admitting: Oncology

## 2011-05-27 ENCOUNTER — Encounter: Payer: Self-pay | Admitting: Oncology

## 2011-05-27 ENCOUNTER — Telehealth: Payer: Self-pay

## 2011-05-27 VITALS — BP 132/79 | HR 77 | Temp 97.6°F | Ht 64.0 in | Wt 164.7 lb

## 2011-05-27 DIAGNOSIS — C569 Malignant neoplasm of unspecified ovary: Secondary | ICD-10-CM

## 2011-05-27 LAB — CBC WITH DIFFERENTIAL/PLATELET
BASO%: 1.2 % (ref 0.0–2.0)
Basophils Absolute: 0.1 10*3/uL (ref 0.0–0.1)
EOS%: 1.2 % (ref 0.0–7.0)
Eosinophils Absolute: 0.1 10*3/uL (ref 0.0–0.5)
HCT: 31.6 % — ABNORMAL LOW (ref 34.8–46.6)
HGB: 10.6 g/dL — ABNORMAL LOW (ref 11.6–15.9)
LYMPH%: 36.7 % (ref 14.0–49.7)
MCH: 30.7 pg (ref 25.1–34.0)
MCHC: 33.5 g/dL (ref 31.5–36.0)
MCV: 91.6 fL (ref 79.5–101.0)
MONO#: 0.7 10*3/uL (ref 0.1–0.9)
MONO%: 15.4 % — ABNORMAL HIGH (ref 0.0–14.0)
NEUT#: 2 10*3/uL (ref 1.5–6.5)
NEUT%: 45.5 % (ref 38.4–76.8)
Platelets: 222 10*3/uL (ref 145–400)
RBC: 3.45 10*6/uL — ABNORMAL LOW (ref 3.70–5.45)
RDW: 14.5 % (ref 11.2–14.5)
WBC: 4.3 10*3/uL (ref 3.9–10.3)
lymph#: 1.6 10*3/uL (ref 0.9–3.3)

## 2011-05-27 LAB — BASIC METABOLIC PANEL
BUN: 23 mg/dL (ref 6–23)
CO2: 28 mEq/L (ref 19–32)
Calcium: 10.2 mg/dL (ref 8.4–10.5)
Chloride: 101 mEq/L (ref 96–112)
Creatinine, Ser: 1.04 mg/dL (ref 0.50–1.10)
Glucose, Bld: 116 mg/dL — ABNORMAL HIGH (ref 70–99)
Potassium: 4.8 mEq/L (ref 3.5–5.3)
Sodium: 136 mEq/L (ref 135–145)

## 2011-05-27 LAB — IRON AND TIBC
%SAT: 25 % (ref 20–55)
Iron: 74 ug/dL (ref 42–145)
TIBC: 299 ug/dL (ref 250–470)
UIBC: 225 ug/dL (ref 125–400)

## 2011-05-27 LAB — FERRITIN: Ferritin: 498 ng/mL — ABNORMAL HIGH (ref 10–291)

## 2011-05-27 LAB — MAGNESIUM: Magnesium: 1.5 mg/dL (ref 1.5–2.5)

## 2011-05-27 NOTE — Progress Notes (Signed)
OFFICE PROGRESS NOTE Date of Visit 05-27-2011 Physicians: P.Gehrig, R.Taavon, K.Little, J.Hayes, G.Rankin  INTERVAL HISTORY:  Patient is seen, alone for visit, in continuing attention to her primary gyn carcinoma, continuing chemotherapy with taxol and carboplatin with additional 2 cycles planned. She was hospitalized with neutropenic fever and apparent viral gastroenteritis 4-8 thru 05-21-2011, all of which has resolved. Cycle 4 taxol/carboplatin was 05-12-2011 with neupogen x 4 days beginning day 2 and one additional neupogen given in hospital. Magnesium was low in hospital; blood and urine cultures and C.diff all negative in hospital.  Patient had presented with several weeks of bloating and diarrhea in fall 2012, and was found to have ascites and carcinomatosis. Pathology was documented from the malignant ascites, which required two large volume paracenteses in Dec. She received 3 cycles of neoadjuvant carboplatin/ taxol from 01-27-2011 thru 03-10-11; she had neulasta with neutropenia cycle 1, then neupogen after subsequent treatments due to severity of aches with taxol/neulasta. CA 125 was 239 in Dec and down to 4.9 by early Feb. She went to TAH/BSO/omentectomy by Dr.ClarkePearson on 04-01-2011, with extensive adhesions noted and oozing of ascites fluid from all peritoneal surfaces during that surgery; pathology had no residual cancer apparent. She resumed taxol/carboplatin with cycle 4 on 05-12-2011, given with neupogen as above. Taxol dose was 135 mg/m2 and carboplatin has been with AUC =5.  Patient has been feeling well since discharge home from hospital, without further diarrhea or fever. Appetite and energy are good and she is having no abdominal or pelvic discomfort. She continues to have some brownish vaginal drainage, using 2 panty liners daily. Gyn oncology has been aware of this, with exam by Dr.Gehrig on 04-24-2011 for this complaint without findings of concern. She is having no itching and no foul  odor associated. Patient would like to have Dr.Taavon evaluate also and plans to call that office.She has some upper respiratory congestion and clear rhinorrhea consistent with spring allergies, took claritin last pm for first time. On Review of Systems otherwise, no respiratory or GI symptoms, is sleeping with Remus Loffler, takes lots of notes as memory not great, no other bleeding, no LE swelling. Remainder of 10 point ROS negative/ unchanged.  Patient's son and granddaughter will be visiting from [place] weekend of 06-07-11 and she does not want chemo prior to their visit; will change next treatment from 4-22 to 4-29 because of this. Objective:  Vital signs in last 24 hours:  BP 132/79  Pulse 77  Temp(Src) 97.6 F (36.4 C) (Oral)  Ht 5\' 4"  (1.626 m)  Wt 164 lb 11.2 oz (74.707 kg)  BMI 28.27 kg/m2 Weight is up 1 lb. Alert, talkative, ambulatory without assistance, able to get on and off exam table, looks comfortable.  HEENT:mucous membranes moist, pharynx normal without lesions. Alopecia. PERRL, not icteric. Nasal turbinates slightly boggy without purulent drainage. LymphaticsCervical, supraclavicular, and axillary nodes normal.No inguinal adenopathy. Resp: clear to auscultation bilaterally and normal percussion bilaterally Cardio: regular rate and rhythm GI: soft, nontender, no distension, surgical incision slightly to left of midline closed and no apparent defect, normal bowel sounds, no appreciable mass or organomegaly. Extremities: extremities normal, atraumatic, no cyanosis or edema Neuro:nonfocal   Lab Results:   Basename 05/27/11 0926  WBC 4.3  HGB 10.6*  HCT 31.6*  PLT 222  ANC 2.0 Hgb is up from 9.2 last week, and plt up from 147. Ferritin 498 likely as acute phase reactant, serum iron 74, %sat 25 BMET Available after visit Na 136, K 4.8, creat 1.0 glucose  116 Magnesium 1.5 Studies/Results:  No results found.  Medications: I have reviewed the patient's current  medications. We will instruct her to stop magnesium when she completes present prescription and to continue iron at least thru chemotherapy.  We have discussed circumstances leading to the recent hospitalization. It is likely that marked neutropenia on admission, despite neupogen, was in part related to viral gastroenteritis. Additionally, I will decrease carboplatin to AUC =4 and continue taxol at 135 mg/m2. Patient prefers changing gCSF to one time neulasta shot after next chemotherapy. I have asked her to take tylenol prior to coming for the shot and to try oxycodone in addition to prn tylenol/ ibuprofen beginning evening of neulasta or at first sign of any aches. Claritin would also be fine to use daily after neulasta, as this can help with aches at times.   Assessment/Plan: 1.IIIC gyn carcinoma: post treatment as above, with 2 additional chemotherapy cycles planned. Cycle 5 will be given with delay of 1 week on 4-29 with neulasta 06-10-11. I will see her at least 06-20-11 with CBC/CMET/CA125, or sooner if needed. 2. Slight vaginal drainage: per gyn onc and primary gyn 3. Hypomagnesemia improved, K fine 4.environmental allergies on claritin and plans to follow up with primary MD Patient was in agreement with plans, took notes, expressed appreciation for care. Andru Genter P, MD   05/27/2011, 10:44 AM

## 2011-05-27 NOTE — Patient Instructions (Signed)
May use oxyIR (oxycodone) and/or tylenol and/or ibuprofen for aches after neulasta shot

## 2011-05-27 NOTE — Telephone Encounter (Signed)
SPOKE WITH MS. Dobias AND TOLD HER THAT HER CHEMISTRIES INCLUDING NA; K ARE GOOD, AND MAGNESIUM IS BACK IN GOOD RANGE.  HER IRON IS LOW NORMAL. SHE CAN STOP MAGNESIUM WHEN SHE FINISHES THE TABLETS THAT SHE HAS PER DR. Darrold Span.   DR. Darrold Span WOULD LIKE FOR HER TO TAKE THE IRON UNTIL SHE FINISHES CHEMO.   PT. VERBALIZED UNDERSTANDING.

## 2011-05-28 LAB — CA 125: CA 125: 3.7 U/mL (ref 0.0–30.2)

## 2011-05-30 ENCOUNTER — Telehealth: Payer: Self-pay

## 2011-05-30 DIAGNOSIS — R300 Dysuria: Secondary | ICD-10-CM | POA: Diagnosis not present

## 2011-05-30 DIAGNOSIS — C569 Malignant neoplasm of unspecified ovary: Secondary | ICD-10-CM | POA: Diagnosis not present

## 2011-05-30 DIAGNOSIS — J309 Allergic rhinitis, unspecified: Secondary | ICD-10-CM | POA: Diagnosis not present

## 2011-05-30 NOTE — Telephone Encounter (Signed)
Called pt to inform her of CA 125 level, per Dr. Darrold Span.  Pt verbalizes understanding.

## 2011-05-30 NOTE — Telephone Encounter (Signed)
Rec'd message from pt requesting CA 125 results.  Note to MD for review.

## 2011-06-02 DIAGNOSIS — B373 Candidiasis of vulva and vagina: Secondary | ICD-10-CM | POA: Diagnosis not present

## 2011-06-02 DIAGNOSIS — N898 Other specified noninflammatory disorders of vagina: Secondary | ICD-10-CM | POA: Diagnosis not present

## 2011-06-02 DIAGNOSIS — C569 Malignant neoplasm of unspecified ovary: Secondary | ICD-10-CM | POA: Diagnosis not present

## 2011-06-08 ENCOUNTER — Other Ambulatory Visit: Payer: Self-pay | Admitting: Oncology

## 2011-06-09 ENCOUNTER — Other Ambulatory Visit: Payer: Medicare Other | Admitting: Lab

## 2011-06-09 ENCOUNTER — Ambulatory Visit (HOSPITAL_BASED_OUTPATIENT_CLINIC_OR_DEPARTMENT_OTHER): Payer: Medicare Other

## 2011-06-09 ENCOUNTER — Telehealth: Payer: Self-pay

## 2011-06-09 VITALS — BP 133/77 | HR 77 | Temp 97.6°F

## 2011-06-09 DIAGNOSIS — Z5111 Encounter for antineoplastic chemotherapy: Secondary | ICD-10-CM

## 2011-06-09 DIAGNOSIS — C569 Malignant neoplasm of unspecified ovary: Secondary | ICD-10-CM

## 2011-06-09 LAB — CBC WITH DIFFERENTIAL/PLATELET
BASO%: 0.2 % (ref 0.0–2.0)
Basophils Absolute: 0 10*3/uL (ref 0.0–0.1)
EOS%: 0 % (ref 0.0–7.0)
Eosinophils Absolute: 0 10*3/uL (ref 0.0–0.5)
HCT: 33 % — ABNORMAL LOW (ref 34.8–46.6)
HGB: 11.2 g/dL — ABNORMAL LOW (ref 11.6–15.9)
LYMPH%: 17.8 % (ref 14.0–49.7)
MCH: 30.9 pg (ref 25.1–34.0)
MCHC: 33.9 g/dL (ref 31.5–36.0)
MCV: 91.2 fL (ref 79.5–101.0)
MONO#: 0 10*3/uL — ABNORMAL LOW (ref 0.1–0.9)
MONO%: 1 % (ref 0.0–14.0)
NEUT#: 3.3 10*3/uL (ref 1.5–6.5)
NEUT%: 81 % — ABNORMAL HIGH (ref 38.4–76.8)
Platelets: 276 10*3/uL (ref 145–400)
RBC: 3.62 10*6/uL — ABNORMAL LOW (ref 3.70–5.45)
RDW: 15 % — ABNORMAL HIGH (ref 11.2–14.5)
WBC: 4.1 10*3/uL (ref 3.9–10.3)
lymph#: 0.7 10*3/uL — ABNORMAL LOW (ref 0.9–3.3)
nRBC: 0 % (ref 0–0)

## 2011-06-09 MED ORDER — PACLITAXEL CHEMO INJECTION 300 MG/50ML
135.0000 mg/m2 | Freq: Once | INTRAVENOUS | Status: AC
  Administered 2011-06-09: 264 mg via INTRAVENOUS
  Filled 2011-06-09: qty 44

## 2011-06-09 MED ORDER — SODIUM CHLORIDE 0.9 % IV SOLN
300.0000 mg | Freq: Once | INTRAVENOUS | Status: AC
  Administered 2011-06-09: 300 mg via INTRAVENOUS
  Filled 2011-06-09: qty 30

## 2011-06-09 MED ORDER — FAMOTIDINE IN NACL 20-0.9 MG/50ML-% IV SOLN
20.0000 mg | Freq: Once | INTRAVENOUS | Status: AC
  Administered 2011-06-09: 20 mg via INTRAVENOUS

## 2011-06-09 MED ORDER — SODIUM CHLORIDE 0.9 % IV SOLN
Freq: Once | INTRAVENOUS | Status: AC
  Administered 2011-06-09: 10:00:00 via INTRAVENOUS

## 2011-06-09 MED ORDER — DIPHENHYDRAMINE HCL 50 MG/ML IJ SOLN
50.0000 mg | Freq: Once | INTRAMUSCULAR | Status: AC
  Administered 2011-06-09: 50 mg via INTRAVENOUS

## 2011-06-09 MED ORDER — ONDANSETRON 16 MG/50ML IVPB (CHCC)
16.0000 mg | Freq: Once | INTRAVENOUS | Status: AC
  Administered 2011-06-09: 16 mg via INTRAVENOUS

## 2011-06-09 MED ORDER — DEXAMETHASONE SODIUM PHOSPHATE 4 MG/ML IJ SOLN
20.0000 mg | Freq: Once | INTRAMUSCULAR | Status: AC
  Administered 2011-06-09: 20 mg via INTRAVENOUS

## 2011-06-09 NOTE — Telephone Encounter (Signed)
Spoke with Ms. Riggio and she stated that she noticed these tiny little bumps on the top of her head in the past week.  The bumps are clear/white, not itchy, sl. raised. Her scalp is oily.  She did not know if Dr. Darrold Span  could prescribe a cream or does she need to go to a dermatologist? She is trying not to use her wig if possible.  She uses a scarf frequently. Will take a look at the site tomorrow when she comes for her injection.

## 2011-06-09 NOTE — Patient Instructions (Signed)
[facility] Discharge Instructions for Patients Receiving Chemotherapy  Today you received the following chemotherapy agents Taxol and Carboplatin.  To help prevent nausea and vomiting after your treatment, we encourage you to take your nausea medication as prescribed.   If you develop nausea and vomiting that is not controlled by your nausea medication, call the clinic. If it is after clinic hours your family physician or the after hours number for the clinic or go to the Emergency Department.   BELOW ARE SYMPTOMS THAT SHOULD BE REPORTED IMMEDIATELY:  *FEVER GREATER THAN 100.5 F  *CHILLS WITH OR WITHOUT FEVER  NAUSEA AND VOMITING THAT IS NOT CONTROLLED WITH YOUR NAUSEA MEDICATION  *UNUSUAL SHORTNESS OF BREATH  *UNUSUAL BRUISING OR BLEEDING  TENDERNESS IN MOUTH AND THROAT WITH OR WITHOUT PRESENCE OF ULCERS  *URINARY PROBLEMS  *BOWEL PROBLEMS  UNUSUAL RASH Items with * indicate a potential emergency and should be followed up as soon as possible.  One of the nurses will contact you 24 hours after your treatment. Please let the nurse know about any problems that you may have experienced. Feel free to call the clinic you have any questions or concerns. The clinic phone number is [phone].   I have been informed and understand all the instructions given to me. I know to contact the clinic, my physician, or go to the Emergency Department if any problems should occur. I do not have any questions at this time, but understand that I may call the clinic during office hours   should I have any questions or need assistance in obtaining follow up care.    __________________________________________  _____________  __________ Signature of Patient or Authorized Representative            Date                   Time    __________________________________________ Nurse's Signature    

## 2011-06-10 ENCOUNTER — Ambulatory Visit (HOSPITAL_BASED_OUTPATIENT_CLINIC_OR_DEPARTMENT_OTHER): Payer: Medicare Other

## 2011-06-10 VITALS — BP 147/80 | HR 62 | Temp 98.0°F

## 2011-06-10 DIAGNOSIS — C569 Malignant neoplasm of unspecified ovary: Secondary | ICD-10-CM | POA: Diagnosis not present

## 2011-06-10 MED ORDER — PEGFILGRASTIM INJECTION 6 MG/0.6ML
6.0000 mg | Freq: Once | SUBCUTANEOUS | Status: AC
  Administered 2011-06-10: 6 mg via SUBCUTANEOUS
  Filled 2011-06-10: qty 0.6

## 2011-06-10 NOTE — Progress Notes (Signed)
Pt.'s scalp looked at by Lonna Cobb NP this am while here for neulasta.   Misty Stanley told pt. That it looked like she had resolving folliculitis from her hair loss.  She recommend just using the baby shampoo as she has been doing.  Ms. Legrande felt that it the bumps had decreased since yesterday.

## 2011-06-18 DIAGNOSIS — H35359 Cystoid macular degeneration, unspecified eye: Secondary | ICD-10-CM | POA: Diagnosis not present

## 2011-06-18 DIAGNOSIS — H348392 Tributary (branch) retinal vein occlusion, unspecified eye, stable: Secondary | ICD-10-CM | POA: Diagnosis not present

## 2011-06-18 DIAGNOSIS — H35049 Retinal micro-aneurysms, unspecified, unspecified eye: Secondary | ICD-10-CM | POA: Diagnosis not present

## 2011-06-20 ENCOUNTER — Other Ambulatory Visit (HOSPITAL_BASED_OUTPATIENT_CLINIC_OR_DEPARTMENT_OTHER): Payer: Medicare Other | Admitting: Lab

## 2011-06-20 ENCOUNTER — Encounter: Payer: Self-pay | Admitting: Oncology

## 2011-06-20 ENCOUNTER — Telehealth: Payer: Self-pay | Admitting: Oncology

## 2011-06-20 ENCOUNTER — Ambulatory Visit (HOSPITAL_BASED_OUTPATIENT_CLINIC_OR_DEPARTMENT_OTHER): Payer: Medicare Other | Admitting: Oncology

## 2011-06-20 ENCOUNTER — Telehealth: Payer: Self-pay | Admitting: *Deleted

## 2011-06-20 VITALS — BP 143/83 | HR 84 | Temp 98.8°F | Wt 163.7 lb

## 2011-06-20 DIAGNOSIS — C569 Malignant neoplasm of unspecified ovary: Secondary | ICD-10-CM

## 2011-06-20 DIAGNOSIS — N390 Urinary tract infection, site not specified: Secondary | ICD-10-CM | POA: Diagnosis not present

## 2011-06-20 LAB — CBC WITH DIFFERENTIAL/PLATELET
BASO%: 0.3 % (ref 0.0–2.0)
Basophils Absolute: 0 10*3/uL (ref 0.0–0.1)
EOS%: 0.6 % (ref 0.0–7.0)
Eosinophils Absolute: 0.1 10*3/uL (ref 0.0–0.5)
HCT: 33.5 % — ABNORMAL LOW (ref 34.8–46.6)
HGB: 11.2 g/dL — ABNORMAL LOW (ref 11.6–15.9)
LYMPH%: 9.6 % — ABNORMAL LOW (ref 14.0–49.7)
MCH: 31.6 pg (ref 25.1–34.0)
MCHC: 33.5 g/dL (ref 31.5–36.0)
MCV: 94.4 fL (ref 79.5–101.0)
MONO#: 1.5 10*3/uL — ABNORMAL HIGH (ref 0.1–0.9)
MONO%: 10.1 % (ref 0.0–14.0)
NEUT#: 11.7 10*3/uL — ABNORMAL HIGH (ref 1.5–6.5)
NEUT%: 79.4 % — ABNORMAL HIGH (ref 38.4–76.8)
Platelets: 119 10*3/uL — ABNORMAL LOW (ref 145–400)
RBC: 3.55 10*6/uL — ABNORMAL LOW (ref 3.70–5.45)
RDW: 16 % — ABNORMAL HIGH (ref 11.2–14.5)
WBC: 14.7 10*3/uL — ABNORMAL HIGH (ref 3.9–10.3)
lymph#: 1.4 10*3/uL (ref 0.9–3.3)
nRBC: 0 % (ref 0–0)

## 2011-06-20 LAB — CA 125: CA 125: 4.2 U/mL (ref 0.0–30.2)

## 2011-06-20 LAB — COMPREHENSIVE METABOLIC PANEL
ALT: 12 U/L (ref 0–35)
AST: 20 U/L (ref 0–37)
Albumin: 4 g/dL (ref 3.5–5.2)
Alkaline Phosphatase: 92 U/L (ref 39–117)
BUN: 20 mg/dL (ref 6–23)
CO2: 26 mEq/L (ref 19–32)
Calcium: 9.8 mg/dL (ref 8.4–10.5)
Chloride: 100 mEq/L (ref 96–112)
Creatinine, Ser: 1.14 mg/dL — ABNORMAL HIGH (ref 0.50–1.10)
Glucose, Bld: 99 mg/dL (ref 70–99)
Potassium: 4.7 mEq/L (ref 3.5–5.3)
Sodium: 137 mEq/L (ref 135–145)
Total Bilirubin: 0.3 mg/dL (ref 0.3–1.2)
Total Protein: 6.7 g/dL (ref 6.0–8.3)

## 2011-06-20 NOTE — Patient Instructions (Signed)
Appointments as scheduled. 

## 2011-06-20 NOTE — Progress Notes (Signed)
OFFICE PROGRESS NOTE Date of Visit 06-20-2011 Physicians: P.Gehrig, R.Taavon, K.Little, J.Hayes, G.Rankin  INTERVAL HISTORY:   Patient is seen, alone for visit, in continuing attention to her primary gyn carcinoma, with cycle 5 of planned 6 cycles of taxol/carboplatin given at [facility] on 06-09-11 with neulasta 06-10-11. Cycle 5 was delayed a week at patient's request because of visiting family. Patient had presented with several weeks of bloating and diarrhea in fall 2012, and was found to have ascites and carcinomatosis. Pathology was documented from the malignant ascites, which required two large volume paracenteses in Dec. She received 3 cycles of neoadjuvant carboplatin/ taxol from 01-27-2011 thru 03-10-11; she had neulasta with neutropenia cycle 1, then neupogen after subsequent treatments due to severity of aches with taxol/neulasta. CA 125 was 239 in Dec and down to 4.9 by early Feb. She went to TAH/BSO/omentectomy by Dr.ClarkePearson on 04-01-2011, with extensive adhesions noted and oozing of ascites fluid from all peritoneal surfaces during that surgery; pathology had no residual cancer apparent. She resumed taxol/carboplatin with cycle 4 on 05-12-2011 and cycle 5 as above.  Patient tolerated cycle 5 treatment well overall.She has had no nausea, uses prophylactic zofran x1 dose day after Rx only. Bowels are moving well, energy is good and she has no peripheral neuropathy symptoms. She used some ibuprofen for neulasta aches, does prefer neulasta to neupogen. Scalp irritation does not seem to be bothering her now. She saw Dr.Taavon at her request re vaginal drainage, was told that she "is still healing inside" which put her at ease. The drainage is now minimal slightly brownish, improving, and the slight perineal/ perirectal itching is better with monistat cream. Otherwise, no fever or symptoms of infection, no other bleeding, good appetite, no LE swelling, no abdominal or pelvic pain, sleeps well and  remainder of 10 point Review of Systems negative.   Objective:  Vital signs in last 24 hours:  BP 143/83  Pulse 84  Temp(Src) 98.8 F (37.1 C) (Oral)  Wt 163 lb 11.2 oz (74.254 kg) Weight is down 2 lbs.Easily ambulatory, talkative, appears comfortable.Wearing wig  HEENT:mucous membranes moist, pharynx normal without lesionsPERRL, not icteric. LymphaticsCervical, supraclavicular, and axillary nodes normal.No inguinal adenopathy Resp: clear to auscultation bilaterally and normal percussion bilaterally Cardio: regular rate and rhythm GI: soft, not obviously distended, some normal bowel sounds, midline incision well-healed, no appreciable HSM or mast  Extremities: extremities normal, atraumatic, no cyanosis or edema Neuro:no sensory deficits noted No central catheter  Lab Results:   Basename 06/20/11 0903  WBC 14.7*  HGB 11.2*  HCT 33.5*  PLT 119*   ANC 11.7 BMET CMET resulted after visit normal with exception of creatinine of 1.14, this having been 1.0 a month ago  CA 125 available after visit 4.2 Studies/Results:  No results found.  Medications: I have reviewed the patient's current medications. No changes needed presently.  Assessment/Plan: 1.Primary gyn carcinoma: history as above, clinically doing very well. Will recheck  CBC day prior to cycle 6 chemotherapy and proceed with treatment if ANC >=1.5 and plt >=100k. She does take premedication decadron, and will have neulasta day after chemotherapy. I will see her back ~ 3 weeks after that treatment with CBC/CMET/CA 125 and will set up return visit to gyn oncology from that visit. 2.HTN, bilateral knee replacements and post op staph infection requiring multiple additional surgeries, GERD, SIADH with surgeries.  She will be able to go to dentist and resume water aerobics in June after counts recover from last treatment  Zetta Stoneman P,  MD   06/20/2011, 1:07 PM

## 2011-06-20 NOTE — Telephone Encounter (Signed)
Per staff message from [place], I have scheduled treatment appt for 5/21. Appt in computer and Rose aware.  JMW

## 2011-06-20 NOTE — Telephone Encounter (Signed)
Gave pt appt calendar for May and June 2013 , emailed Marcelino Duster  For chemo on 06/30/11

## 2011-06-23 ENCOUNTER — Telehealth: Payer: Self-pay | Admitting: *Deleted

## 2011-06-23 NOTE — Telephone Encounter (Signed)
Per staff message from [place], I have moved up the 5/21 treatment to an earlier time. Appts moved and Rose aware. Appt moved per patient request.   JMW

## 2011-06-27 ENCOUNTER — Telehealth: Payer: Self-pay | Admitting: Oncology

## 2011-06-27 NOTE — Telephone Encounter (Signed)
Talked to pt, she is aware of all appts in May and June 2013

## 2011-06-30 ENCOUNTER — Telehealth: Payer: Self-pay

## 2011-06-30 ENCOUNTER — Other Ambulatory Visit (HOSPITAL_BASED_OUTPATIENT_CLINIC_OR_DEPARTMENT_OTHER): Payer: Medicare Other | Admitting: Lab

## 2011-06-30 DIAGNOSIS — C569 Malignant neoplasm of unspecified ovary: Secondary | ICD-10-CM | POA: Diagnosis not present

## 2011-06-30 LAB — CBC WITH DIFFERENTIAL/PLATELET
BASO%: 0.9 % (ref 0.0–2.0)
Basophils Absolute: 0.1 10*3/uL (ref 0.0–0.1)
EOS%: 1.6 % (ref 0.0–7.0)
Eosinophils Absolute: 0.1 10*3/uL (ref 0.0–0.5)
HCT: 31.8 % — ABNORMAL LOW (ref 34.8–46.6)
HGB: 10.6 g/dL — ABNORMAL LOW (ref 11.6–15.9)
LYMPH%: 21.7 % (ref 14.0–49.7)
MCH: 30.6 pg (ref 25.1–34.0)
MCHC: 33.3 g/dL (ref 31.5–36.0)
MCV: 91.9 fL (ref 79.5–101.0)
MONO#: 0.8 10*3/uL (ref 0.1–0.9)
MONO%: 14.5 % — ABNORMAL HIGH (ref 0.0–14.0)
NEUT#: 3.5 10*3/uL (ref 1.5–6.5)
NEUT%: 61.3 % (ref 38.4–76.8)
Platelets: 227 10*3/uL (ref 145–400)
RBC: 3.46 10*6/uL — ABNORMAL LOW (ref 3.70–5.45)
RDW: 15.5 % — ABNORMAL HIGH (ref 11.2–14.5)
WBC: 5.7 10*3/uL (ref 3.9–10.3)
lymph#: 1.2 10*3/uL (ref 0.9–3.3)
nRBC: 0 % (ref 0–0)

## 2011-06-30 NOTE — Telephone Encounter (Signed)
Spoke with Ms. Sappington and told her that her counts were good for her treatment tomorrow.  She verbalized understanding.  She is aware of when to take her decadron premed.

## 2011-07-01 ENCOUNTER — Ambulatory Visit (HOSPITAL_BASED_OUTPATIENT_CLINIC_OR_DEPARTMENT_OTHER): Payer: Medicare Other

## 2011-07-01 VITALS — BP 152/81 | HR 81 | Temp 98.8°F

## 2011-07-01 DIAGNOSIS — Z5111 Encounter for antineoplastic chemotherapy: Secondary | ICD-10-CM

## 2011-07-01 DIAGNOSIS — C569 Malignant neoplasm of unspecified ovary: Secondary | ICD-10-CM

## 2011-07-01 MED ORDER — DEXAMETHASONE SODIUM PHOSPHATE 4 MG/ML IJ SOLN
20.0000 mg | Freq: Once | INTRAMUSCULAR | Status: AC
  Administered 2011-07-01: 20 mg via INTRAVENOUS

## 2011-07-01 MED ORDER — SODIUM CHLORIDE 0.9 % IV SOLN
Freq: Once | INTRAVENOUS | Status: AC
  Administered 2011-07-01: 09:00:00 via INTRAVENOUS

## 2011-07-01 MED ORDER — ONDANSETRON 16 MG/50ML IVPB (CHCC)
16.0000 mg | Freq: Once | INTRAVENOUS | Status: AC
  Administered 2011-07-01: 16 mg via INTRAVENOUS

## 2011-07-01 MED ORDER — SODIUM CHLORIDE 0.9 % IV SOLN
300.0000 mg | Freq: Once | INTRAVENOUS | Status: AC
  Administered 2011-07-01: 300 mg via INTRAVENOUS
  Filled 2011-07-01: qty 30

## 2011-07-01 MED ORDER — DIPHENHYDRAMINE HCL 50 MG/ML IJ SOLN
50.0000 mg | Freq: Once | INTRAMUSCULAR | Status: AC
  Administered 2011-07-01: 50 mg via INTRAVENOUS

## 2011-07-01 MED ORDER — FAMOTIDINE IN NACL 20-0.9 MG/50ML-% IV SOLN
20.0000 mg | Freq: Once | INTRAVENOUS | Status: AC
Start: 2011-07-01 — End: 2011-07-01
  Administered 2011-07-01: 20 mg via INTRAVENOUS

## 2011-07-01 MED ORDER — PACLITAXEL CHEMO INJECTION 300 MG/50ML
135.0000 mg/m2 | Freq: Once | INTRAVENOUS | Status: AC
  Administered 2011-07-01: 264 mg via INTRAVENOUS
  Filled 2011-07-01: qty 44

## 2011-07-02 ENCOUNTER — Ambulatory Visit (HOSPITAL_BASED_OUTPATIENT_CLINIC_OR_DEPARTMENT_OTHER): Payer: Medicare Other

## 2011-07-02 VITALS — BP 127/67 | HR 69 | Temp 98.2°F

## 2011-07-02 DIAGNOSIS — C786 Secondary malignant neoplasm of retroperitoneum and peritoneum: Secondary | ICD-10-CM | POA: Diagnosis not present

## 2011-07-02 DIAGNOSIS — C569 Malignant neoplasm of unspecified ovary: Secondary | ICD-10-CM

## 2011-07-02 DIAGNOSIS — C801 Malignant (primary) neoplasm, unspecified: Secondary | ICD-10-CM | POA: Diagnosis not present

## 2011-07-02 MED ORDER — PEGFILGRASTIM INJECTION 6 MG/0.6ML
6.0000 mg | Freq: Once | SUBCUTANEOUS | Status: AC
  Administered 2011-07-02: 6 mg via SUBCUTANEOUS

## 2011-07-15 ENCOUNTER — Telehealth: Payer: Self-pay

## 2011-07-15 DIAGNOSIS — L570 Actinic keratosis: Secondary | ICD-10-CM | POA: Diagnosis not present

## 2011-07-15 DIAGNOSIS — D485 Neoplasm of uncertain behavior of skin: Secondary | ICD-10-CM | POA: Diagnosis not present

## 2011-07-15 NOTE — Telephone Encounter (Signed)
Lm for [name] that Dr. Darrold Span would like for her to have her PCP make referal to the audiologist. Appt. 07-22-11.  The chemotherapy drugs that she received do not affect her hearing.

## 2011-07-22 DIAGNOSIS — H903 Sensorineural hearing loss, bilateral: Secondary | ICD-10-CM | POA: Diagnosis not present

## 2011-07-23 ENCOUNTER — Telehealth: Payer: Self-pay | Admitting: *Deleted

## 2011-07-23 ENCOUNTER — Ambulatory Visit (HOSPITAL_BASED_OUTPATIENT_CLINIC_OR_DEPARTMENT_OTHER): Payer: Medicare Other | Admitting: Oncology

## 2011-07-23 ENCOUNTER — Encounter: Payer: Self-pay | Admitting: Oncology

## 2011-07-23 ENCOUNTER — Other Ambulatory Visit (HOSPITAL_BASED_OUTPATIENT_CLINIC_OR_DEPARTMENT_OTHER): Payer: Medicare Other | Admitting: Lab

## 2011-07-23 ENCOUNTER — Telehealth: Payer: Self-pay | Admitting: Oncology

## 2011-07-23 VITALS — BP 142/70 | HR 66 | Temp 97.4°F | Ht 64.0 in | Wt 166.1 lb

## 2011-07-23 DIAGNOSIS — C569 Malignant neoplasm of unspecified ovary: Secondary | ICD-10-CM | POA: Diagnosis not present

## 2011-07-23 DIAGNOSIS — T451X5A Adverse effect of antineoplastic and immunosuppressive drugs, initial encounter: Secondary | ICD-10-CM | POA: Diagnosis not present

## 2011-07-23 DIAGNOSIS — I10 Essential (primary) hypertension: Secondary | ICD-10-CM

## 2011-07-23 DIAGNOSIS — D6481 Anemia due to antineoplastic chemotherapy: Secondary | ICD-10-CM

## 2011-07-23 DIAGNOSIS — K219 Gastro-esophageal reflux disease without esophagitis: Secondary | ICD-10-CM | POA: Diagnosis not present

## 2011-07-23 LAB — CBC WITH DIFFERENTIAL/PLATELET
BASO%: 1.2 % (ref 0.0–2.0)
Basophils Absolute: 0.1 10*3/uL (ref 0.0–0.1)
EOS%: 1.9 % (ref 0.0–7.0)
Eosinophils Absolute: 0.1 10*3/uL (ref 0.0–0.5)
HCT: 33.1 % — ABNORMAL LOW (ref 34.8–46.6)
HGB: 11.3 g/dL — ABNORMAL LOW (ref 11.6–15.9)
LYMPH%: 22.1 % (ref 14.0–49.7)
MCH: 32.6 pg (ref 25.1–34.0)
MCHC: 34.2 g/dL (ref 31.5–36.0)
MCV: 95.4 fL (ref 79.5–101.0)
MONO#: 0.9 10*3/uL (ref 0.1–0.9)
MONO%: 19.1 % — ABNORMAL HIGH (ref 0.0–14.0)
NEUT#: 2.6 10*3/uL (ref 1.5–6.5)
NEUT%: 55.7 % (ref 38.4–76.8)
Platelets: 162 10*3/uL (ref 145–400)
RBC: 3.47 10*6/uL — ABNORMAL LOW (ref 3.70–5.45)
RDW: 16.8 % — ABNORMAL HIGH (ref 11.2–14.5)
WBC: 4.6 10*3/uL (ref 3.9–10.3)
lymph#: 1 10*3/uL (ref 0.9–3.3)

## 2011-07-23 LAB — COMPREHENSIVE METABOLIC PANEL
ALT: 10 U/L (ref 0–35)
AST: 19 U/L (ref 0–37)
Albumin: 3.9 g/dL (ref 3.5–5.2)
Alkaline Phosphatase: 61 U/L (ref 39–117)
BUN: 29 mg/dL — ABNORMAL HIGH (ref 6–23)
CO2: 26 mEq/L (ref 19–32)
Calcium: 9.8 mg/dL (ref 8.4–10.5)
Chloride: 103 mEq/L (ref 96–112)
Creatinine, Ser: 1.07 mg/dL (ref 0.50–1.10)
Glucose, Bld: 93 mg/dL (ref 70–99)
Potassium: 5.1 mEq/L (ref 3.5–5.3)
Sodium: 138 mEq/L (ref 135–145)
Total Bilirubin: 0.4 mg/dL (ref 0.3–1.2)
Total Protein: 6.6 g/dL (ref 6.0–8.3)

## 2011-07-23 LAB — CA 125: CA 125: 3.3 U/mL (ref 0.0–30.2)

## 2011-07-23 NOTE — Telephone Encounter (Signed)
Patient notified of CA 125 results. (3.3)

## 2011-07-23 NOTE — Telephone Encounter (Signed)
appts made and printed for pt aom °

## 2011-07-23 NOTE — Patient Instructions (Signed)
[name] will let you know about appointment with gyn oncology in ~ 3 months. Dr Darrold Span will see you in ~ 6 months.  Call if needed before scheduled appointments.

## 2011-07-23 NOTE — Progress Notes (Signed)
OFFICE PROGRESS NOTE Date of Visit 07-23-11 Physicians: P.Gehrig, R.Taavon, K.Little, J.Hayes, G.Rankin  INTERVAL HISTORY:  Patient is seen, alone for visit, in continuing attention to her primary gyn carcinoma, now having had sixth and last planned chemotherapy treatment on 07-01-11. Patient had presented with several weeks of bloating and diarrhea in fall 2012, and was found to have ascites and carcinomatosis. Pathology was documented from the malignant ascites 01-16-2011, consistent with serous gyn primary. She  required two large volume paracenteses in Dec. She received 3 cycles of neoadjuvant carboplatin/ taxol from 01-27-2011 thru 03-10-11. She was neutropenic cycle 1 and has had gCSF support since then. CA 125 was 239 in Dec and down to 4.9 by early Feb. She went to TAH/BSO/omentectomy by Dr.ClarkePearson on 04-01-2011, with extensive adhesions noted and oozing of ascites fluid from all peritoneal surfaces during that surgery; pathology had no residual cancer apparent. She resumed taxol/carboplatin on 05-12-11, with cycle 5 delayed a week due to family event, and cycle 6 07-01-11 with neulasta 07-02-11.CA 125 was 4.2 on 06-20-11 prior to cycle 6.  Patient has done well with most recent chemotherapy and tells me that she feels that she is already back to her baseline. Energy is generally good and she is doing all of her regular activities. She has no significant peripheral neuropathy, appetite is good, the vaginal drainage stopped completely in last 2-3 weeks, no other bleeding, no abdominal or pelvic discomfort, bowles moving normally, no fever or symptoms of infection. Remainder of 10 point Review of Systems negative.  Her granddaughter is to be married in August. Daughter is to have hysterectomy in July.  Objective:  Vital signs in last 24 hours:  BP 142/70  Pulse 66  Temp 97.4 F (36.3 C) (Oral)  Ht 5\' 4"  (1.626 m)  Wt 166 lb 1.6 oz (75.342 kg)  BMI 28.51 kg/m2 Weight is up 3 lbs. Easily  mobile, wearing wig, taking notes, looks comfortable.   HEENT:mucous membranes moist, pharynx normal without lesions. Alopecia. PERRL, not icteric LymphaticsCervical, supraclavicular, and axillary nodes normal.No inguinal adenopathy Resp: clear to auscultation bilaterally and normal percussion bilaterally Cardio: regular rate and rhythm GI: soft, non-tender; bowel sounds normal; no masses,  no organomegalynot distended Extremities: extremities normal, atraumatic, no cyanosis or edema Neuro:no sensory deficits noted No central catheter  Lab Results:   Basename 07/23/11 0931  WBC 4.6  HGB 11.3*  HCT 33.1*  PLT 162  ANC 2.6 These counts are past nadir. BMET  Basename 07/23/11 0931  NA 138  K 5.1  CL 103  CO2 26  GLUCOSE 93  BUN 29*  CREATININE 1.07  CALCIUM 9.8  Remainder of CMET normal CA 125 available after visit 3.3 Studies/Results:  No results found.  Medications: I have reviewed the patient's current medications.  I have discussed with gyn oncology office and she has been given a return appointment to Dr Duard Brady in 3 months (10-23-11). Assessment/Plan: 1.Serous gyn carcinoma with history as above: remarkable response to treatment and clinically doing well. She will see gyn oncology in 3 months, with repeat labs prior to that visit, and I will see her at least 3 months after that,again with labs,  or certainly sooner if needed. 2.mild anemia related to chemotherapy and surgery, improving. 3.history of HTN, GERD, SIADH with previous surgeries, bilateral knee replacements.  Patient was comfortable with discussion, had questions answered to her satisfaction and expressed appreciation for care.  Chancy Claros P, MD   07/23/2011, 9:03 PM

## 2011-07-31 DIAGNOSIS — H35359 Cystoid macular degeneration, unspecified eye: Secondary | ICD-10-CM | POA: Diagnosis not present

## 2011-07-31 DIAGNOSIS — H348392 Tributary (branch) retinal vein occlusion, unspecified eye, stable: Secondary | ICD-10-CM | POA: Diagnosis not present

## 2011-08-29 DIAGNOSIS — R350 Frequency of micturition: Secondary | ICD-10-CM | POA: Diagnosis not present

## 2011-08-29 DIAGNOSIS — N899 Noninflammatory disorder of vagina, unspecified: Secondary | ICD-10-CM | POA: Diagnosis not present

## 2011-08-29 DIAGNOSIS — N952 Postmenopausal atrophic vaginitis: Secondary | ICD-10-CM | POA: Diagnosis not present

## 2011-09-11 DIAGNOSIS — N952 Postmenopausal atrophic vaginitis: Secondary | ICD-10-CM | POA: Diagnosis not present

## 2011-09-30 ENCOUNTER — Ambulatory Visit (INDEPENDENT_AMBULATORY_CARE_PROVIDER_SITE_OTHER): Payer: Medicare Other | Admitting: Psychiatry

## 2011-09-30 DIAGNOSIS — F4323 Adjustment disorder with mixed anxiety and depressed mood: Secondary | ICD-10-CM | POA: Diagnosis not present

## 2011-09-30 DIAGNOSIS — F0630 Mood disorder due to known physiological condition, unspecified: Secondary | ICD-10-CM

## 2011-09-30 NOTE — Progress Notes (Signed)
09-30-2011  Patient seen for initial psychological evaluation.  Next appointment 10-07-2011.

## 2011-10-02 DIAGNOSIS — H348392 Tributary (branch) retinal vein occlusion, unspecified eye, stable: Secondary | ICD-10-CM | POA: Diagnosis not present

## 2011-10-02 DIAGNOSIS — H35359 Cystoid macular degeneration, unspecified eye: Secondary | ICD-10-CM | POA: Diagnosis not present

## 2011-10-07 ENCOUNTER — Ambulatory Visit (INDEPENDENT_AMBULATORY_CARE_PROVIDER_SITE_OTHER): Payer: Medicare Other | Admitting: Psychiatry

## 2011-10-07 DIAGNOSIS — F4323 Adjustment disorder with mixed anxiety and depressed mood: Secondary | ICD-10-CM

## 2011-10-07 DIAGNOSIS — F0630 Mood disorder due to known physiological condition, unspecified: Secondary | ICD-10-CM

## 2011-10-07 NOTE — Progress Notes (Signed)
10-07-2011  Patient seen for individual psychotherapy.  Next appointment 10-21-2011. 

## 2011-10-21 ENCOUNTER — Ambulatory Visit (INDEPENDENT_AMBULATORY_CARE_PROVIDER_SITE_OTHER): Payer: Medicare Other | Admitting: Psychiatry

## 2011-10-21 DIAGNOSIS — F0630 Mood disorder due to known physiological condition, unspecified: Secondary | ICD-10-CM

## 2011-10-21 DIAGNOSIS — F4323 Adjustment disorder with mixed anxiety and depressed mood: Secondary | ICD-10-CM | POA: Diagnosis not present

## 2011-10-21 NOTE — Progress Notes (Signed)
10-21-2011  Patient seen for individual psychotherapy.  Next appointment 11-04-2011. 

## 2011-10-23 ENCOUNTER — Other Ambulatory Visit (HOSPITAL_BASED_OUTPATIENT_CLINIC_OR_DEPARTMENT_OTHER): Payer: Medicare Other | Admitting: Lab

## 2011-10-23 ENCOUNTER — Ambulatory Visit: Payer: Medicare Other | Attending: Gynecologic Oncology | Admitting: Gynecologic Oncology

## 2011-10-23 ENCOUNTER — Encounter: Payer: Self-pay | Admitting: Gynecologic Oncology

## 2011-10-23 VITALS — BP 118/70 | HR 66 | Temp 98.4°F | Resp 18 | Ht 63.98 in | Wt 169.6 lb

## 2011-10-23 DIAGNOSIS — C569 Malignant neoplasm of unspecified ovary: Secondary | ICD-10-CM

## 2011-10-23 NOTE — Patient Instructions (Signed)
Return to clinic 2-3 months after your appointment with Dr. Darrold Span.

## 2011-10-23 NOTE — Progress Notes (Signed)
Consult Note: Gyn-Onc  [name] 78 y.o. female  CC:  Chief Complaint  Patient presents with  . 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 Her illness began with several weeks of diarrhea and abdominal bloating the fall of 2012. She was tested several times for C. difficile was negative. Dr. Billy Coast at that time recommended an ultrasound and ultimately a CT scan. Ultrasound showed significant ascites and her CA 125 was markedly elevated which is when we saw her. CT imaging was most consistent with markedly advanced disease that would most likely preclude optimal debulking. This in conjunction with her poor performance status the decision was made to proceed with neoadjuvant chemotherapy. She had a high-volume paracentesis to confirm the diagnosis of a gynecologic malignancy.   She has undergone 3 cycles of neoadjuvant chemotherapy under the care of Dr. Darrold Span with paclitaxel and carboplatin. Her CA 125 prior cycle #1 was 204 and on January 18 prior to cycle #2 her CA 125 was 12.6. She also had a CT scan that showed marked resolution of her ascites and marked improvement in the omental caking. There is no mention of any significant carcinomatosis. There is no significant adenopathy. The adnexa and uterus are within normal limits.   After discussion of pros and cons she opted for definitive surgical management. On 04/01/2011 she underwent surgery by Dr. Stanford Breed. She underwent exploratory laparotomy TAH/BSO and omental resection. There was no gross evidence of disease. Pathology revealed no residual carcinoma within the omentum. There was stromal fibrosis and chronic inflammation and scattered psammoma bodies consistent with posttreatment effect. Within the right ovary and tube was no residual carcinoma. There was benign ovarian tissue with associated serosal adhesions necrosis and scattered psammoma bodies. This  was seen on the uterine serosa. The uterus was otherwise negative. The left tube and ovary also had associated psammoma bodies but no residual carcinoma identified. She did well postoperatively and after lengthy discussions underwent an additional 3 cycles of chemotherapy which she completed in 06-20-11.  CA-125 at that time was 4.2 and on 07/23/11 it was 3.3. Today's is still pending.  Interval History/Review of systems: She has not required any pain medication. She does complain of occasional twinge type pain. She's had normal return of bowel and bladder function. . Her energy level is normal. Her appetite is excellent in fact she thinks that she's eating too much. Her weight is up 6 pounds since we last saw her in March. She does occasionally have some vaginal irritation. She was seen by Dr. Billy Coast for this and was prescribed some nystatin and nystatin with triamcinolone cream. She was also given a prescription for Estrace cream by his nurse practitioner which she has been somewhat scared to use. The nystatin does seem to help her symptoms. She has a change about bladder habits other than some diarrhea this morning from drinking orange juice last night. She denies any chest pain or shortness of breath. She's doing water walking class III days a week. She's been busier taking care of her husband. She states that the "roles have been reversed". His memory is declining she's responsible for more of his care. She is very grateful that she's feeling as well as she is that she can do that. The remainder for 10 point system review is negative.    Current Meds:  Outpatient Encounter Prescriptions as of 10/23/2011  Medication Sig Dispense Refill  .  acetaminophen (TYLENOL) 325 MG tablet Take 650 mg by mouth every 4 (four) hours as needed. Pain       . azelastine (ASTELIN) 137 MCG/SPRAY nasal spray Place 1 spray into the nose 2 (two) times daily. Use in each nostril as directed      . Calcium Citrate-Vitamin D  (CITRACAL + D PO) Take 2 tablets by mouth daily.      . Cholecalciferol (VITAMIN D) 2000 UNITS tablet Take 2,000 Units by mouth daily.       Marland Kitchen esomeprazole (NEXIUM) 40 MG capsule Take 40 mg by mouth daily before breakfast.      . Hypertonic Nasal Wash (SINUS RINSE BOTTLE KIT NA) Place 1 spray into the nose 2 (two) times daily as needed. Allergies       . ibuprofen (ADVIL,MOTRIN) 200 MG tablet Take 200 mg by mouth every 6 (six) hours as needed. pain      . lisinopril (PRINIVIL,ZESTRIL) 10 MG tablet Take 10 mg by mouth daily after breakfast.       . loratadine (CLARITIN) 10 MG tablet Take 10 mg by mouth daily.      . Multiple Vitamins-Minerals (CENTRUM SILVER PO) Take 1 tablet by mouth daily.       . nadolol (CORGARD) 20 MG tablet Take 10 mg by mouth daily after breakfast.       . nystatin cream (MYCOSTATIN) Apply 1 application topically 3 (three) times daily as needed. Applies to Outside of vagina and anus for Itching       . omega-3 acid ethyl esters (LOVAZA) 1 G capsule Take 1 g by mouth daily.      . Ranibizumab (LUCENTIS IO) Place into the left eye once as needed. Pt receive from dr. Luciana Axe office [phone]      . vitamin E (VITAMIN E) 400 UNIT capsule Take 400 Units by mouth daily.      Marland Kitchen zolpidem (AMBIEN) 10 MG tablet Take 10 mg by mouth at bedtime.       Marland Kitchen dexamethasone (DECADRON) 4 MG tablet Take 20 mg by mouth See admin instructions. TAKE 5 TABS= 20 MG 12 HRS. AND 6 HRS. PRIOR TO CHEMOTHERAPY.  EACH DOSE WITH FOOD.      . magnesium oxide (MAG-OX 400) 400 MG tablet Take 1 tablet (400 mg total) by mouth 2 (two) times daily.  60 tablet  0  . ondansetron (ZOFRAN) 8 MG tablet TAKE 1-2 TABLETS EVERY 12-24 HRS. AS NEEDED FOR NAUSEA.  TAKE 1 TABLET IN THE MORNING AFTER CHEMOTHERAPY WHETHER OR NOT NAUSEA  20 tablet  1  . oxyCODONE (OXY IR/ROXICODONE) 5 MG immediate release tablet Take 1 tablet by mouth every 6 (six) hours as needed.      Bertram Gala Glycol-Propyl Glycol (SYSTANE) 0.4-0.3 %  SOLN Place 1 drop into both eyes 4 (four) times daily.        Allergy:  Allergies  Allergen Reactions  . Morphine And Related     Given after knee replacement and code blue occurred  . Penicillins     REACTION: hives  . Shellfish Allergy     Pt has shellfish allergy only.  Has had IV contrast x 2 and did fine.    Social Hx:   History   Social History  . Marital Status: Married    Spouse Name: N/A    Number of Children: N/A  . Years of Education: N/A   Occupational History  . Not on file.   Social History Main  Topics  . Smoking status: Former Smoker    Quit date: 02/11/1952  . Smokeless tobacco: Never Used  . Alcohol Use: No  . Drug Use: No  . Sexually Active: No   Other Topics Concern  . Not on file   Social History Narrative  . No narrative on file    Past Surgical Hx:  Past Surgical History  Procedure Date  . Tonsillectomy   . Appendectomy   . Joint replacement     R knee in 2008, 5 operations on L knee  . Other surgical history     hx of C section 1966  . Laparotomy 04/01/2011    Procedure: EXPLORATORY LAPAROTOMY;  Surgeon: Jeannette Corpus, MD;  Location: [facility];  Service: Gynecology;  Laterality: N/A;  . Abdominal hysterectomy 04/01/2011    Procedure: HYSTERECTOMY ABDOMINAL;  Surgeon: Jeannette Corpus, MD;  Location: [facility];  Service: Gynecology;  Laterality: N/A;  . Salpingoophorectomy 04/01/2011    Procedure: SALPINGO OOPHERECTOMY;  Surgeon: Jeannette Corpus, MD;  Location: [facility];  Service: Gynecology;  Laterality: Bilateral;    Past Medical Hx:  Past Medical History  Diagnosis Date  . Ascites   . Cystitis   . GERD (gastroesophageal reflux disease)   . Hypertension   . Hyponatremia   . Hyperlipidemia   . Osteopenia   . Postmenopausal atrophic vaginitis   . Vulvitis   . Staph infection 2010    after knee replacement  . Urinary frequency   . Complication of anesthesia     Sodium drops per pt   . Pneumonia     hx of     . Anemia     hx of   . Blood transfusion     hx of 2011   . H/O hiatal hernia   . Arthritis     knees   . Ovarian cancer 01/25/2011    Family Hx:  Family History  Problem Relation Age of Onset  . Heart attack Brother   . Diabetes Brother   . Cancer Other     Bladder cancer    Vitals:  Blood pressure 118/70, pulse 66, temperature 98.4 F (36.9 C), temperature source Oral, resp. rate 18, height 5' 3.98" (1.625 m), weight 169 lb 9.6 oz (76.93 kg).  Physical Exam:  Well-nourished well-developed female in no acute distress.  Neck: Supple, no lymphadenopathy, no thyromegaly.  Lungs: Clear to auscultation bilaterally.  Cardiovascular: Regular rate and rhythm.  Abdomen: Well-healed vertical incision. There is no evidence of an incisional hernia. Abdomen is soft, nontender, nondistended.  Groins: No lymphadenopathy.  Extremities: No edema.  Pelvic: Atrophic external genitalia. There were no palpable masses on bimanual examination.  There is no nodularity. Rectal confirms.  Assessment/Plan: 78 year old diagnosed with advanced serous either primary peritoneal or ovarian carcinoma in December of 2012. Based on CT findings she was treated with neoadjuvant chemotherapy with carboplatin and paclitaxel from 01/27/2011 through March 10 2011. Her CA125 responded nicely as did her imaging. She had an optimal cytoreductive surgery by Dr. Stanford Breed. Final pathology revealed no evidence of disease. In an additional 3 cycles of chemotherapy after her debulking surgery were given and she  has had no evidence of disease since that time. Her clinical exam today is unremarkable. Her CA 125 from today is still pending. Pending the results be normal she'll follow up with Dr. Darrold Span is scheduled in 2-3 months and we'll see her 2-3 months after that appointment.  Hence Derrick A., MD 10/23/2011, 12:10 PM

## 2011-10-24 LAB — CA 125: CA 125: 4.8 U/mL (ref 0.0–30.2)

## 2011-10-28 DIAGNOSIS — M779 Enthesopathy, unspecified: Secondary | ICD-10-CM | POA: Diagnosis not present

## 2011-11-04 ENCOUNTER — Ambulatory Visit (INDEPENDENT_AMBULATORY_CARE_PROVIDER_SITE_OTHER): Payer: Medicare Other | Admitting: Psychiatry

## 2011-11-04 DIAGNOSIS — F0630 Mood disorder due to known physiological condition, unspecified: Secondary | ICD-10-CM | POA: Diagnosis not present

## 2011-11-04 DIAGNOSIS — F4323 Adjustment disorder with mixed anxiety and depressed mood: Secondary | ICD-10-CM

## 2011-11-05 NOTE — Progress Notes (Signed)
11-04-2011  Patient seen for individual psychotherapy.  Next appointment 11-20-2011.

## 2011-11-14 DIAGNOSIS — Z23 Encounter for immunization: Secondary | ICD-10-CM | POA: Diagnosis not present

## 2011-11-20 DIAGNOSIS — H0019 Chalazion unspecified eye, unspecified eyelid: Secondary | ICD-10-CM | POA: Diagnosis not present

## 2011-11-20 DIAGNOSIS — H10029 Other mucopurulent conjunctivitis, unspecified eye: Secondary | ICD-10-CM | POA: Diagnosis not present

## 2011-11-20 DIAGNOSIS — H01009 Unspecified blepharitis unspecified eye, unspecified eyelid: Secondary | ICD-10-CM | POA: Diagnosis not present

## 2011-11-20 DIAGNOSIS — H11159 Pinguecula, unspecified eye: Secondary | ICD-10-CM | POA: Diagnosis not present

## 2011-11-24 NOTE — Progress Notes (Signed)
This encounter was created in error - please disregard.

## 2011-11-27 ENCOUNTER — Ambulatory Visit (INDEPENDENT_AMBULATORY_CARE_PROVIDER_SITE_OTHER): Payer: Medicare Other | Admitting: Psychiatry

## 2011-11-27 DIAGNOSIS — F0630 Mood disorder due to known physiological condition, unspecified: Secondary | ICD-10-CM

## 2011-11-27 DIAGNOSIS — F4323 Adjustment disorder with mixed anxiety and depressed mood: Secondary | ICD-10-CM | POA: Diagnosis not present

## 2011-12-03 DIAGNOSIS — H348392 Tributary (branch) retinal vein occlusion, unspecified eye, stable: Secondary | ICD-10-CM | POA: Diagnosis not present

## 2011-12-03 DIAGNOSIS — H35359 Cystoid macular degeneration, unspecified eye: Secondary | ICD-10-CM | POA: Diagnosis not present

## 2011-12-04 DIAGNOSIS — H0019 Chalazion unspecified eye, unspecified eyelid: Secondary | ICD-10-CM | POA: Diagnosis not present

## 2011-12-04 DIAGNOSIS — H01009 Unspecified blepharitis unspecified eye, unspecified eyelid: Secondary | ICD-10-CM | POA: Diagnosis not present

## 2011-12-04 DIAGNOSIS — H10029 Other mucopurulent conjunctivitis, unspecified eye: Secondary | ICD-10-CM | POA: Diagnosis not present

## 2011-12-24 ENCOUNTER — Ambulatory Visit (INDEPENDENT_AMBULATORY_CARE_PROVIDER_SITE_OTHER): Payer: Medicare Other | Admitting: Psychiatry

## 2011-12-24 DIAGNOSIS — F0630 Mood disorder due to known physiological condition, unspecified: Secondary | ICD-10-CM

## 2011-12-24 DIAGNOSIS — F4323 Adjustment disorder with mixed anxiety and depressed mood: Secondary | ICD-10-CM | POA: Diagnosis not present

## 2012-01-01 DIAGNOSIS — Z1231 Encounter for screening mammogram for malignant neoplasm of breast: Secondary | ICD-10-CM | POA: Diagnosis not present

## 2012-01-14 ENCOUNTER — Ambulatory Visit: Payer: Medicare Other | Admitting: Psychiatry

## 2012-01-16 DIAGNOSIS — H04129 Dry eye syndrome of unspecified lacrimal gland: Secondary | ICD-10-CM | POA: Diagnosis not present

## 2012-01-16 DIAGNOSIS — H0019 Chalazion unspecified eye, unspecified eyelid: Secondary | ICD-10-CM | POA: Diagnosis not present

## 2012-01-16 DIAGNOSIS — H01009 Unspecified blepharitis unspecified eye, unspecified eyelid: Secondary | ICD-10-CM | POA: Diagnosis not present

## 2012-01-22 DIAGNOSIS — M7750 Other enthesopathy of unspecified foot: Secondary | ICD-10-CM | POA: Diagnosis not present

## 2012-01-22 DIAGNOSIS — M2140 Flat foot [pes planus] (acquired), unspecified foot: Secondary | ICD-10-CM | POA: Diagnosis not present

## 2012-01-23 DIAGNOSIS — M949 Disorder of cartilage, unspecified: Secondary | ICD-10-CM | POA: Diagnosis not present

## 2012-01-23 DIAGNOSIS — M779 Enthesopathy, unspecified: Secondary | ICD-10-CM | POA: Diagnosis not present

## 2012-01-23 DIAGNOSIS — M899 Disorder of bone, unspecified: Secondary | ICD-10-CM | POA: Diagnosis not present

## 2012-01-27 ENCOUNTER — Ambulatory Visit (HOSPITAL_BASED_OUTPATIENT_CLINIC_OR_DEPARTMENT_OTHER): Payer: Medicare Other | Admitting: Oncology

## 2012-01-27 ENCOUNTER — Other Ambulatory Visit (HOSPITAL_BASED_OUTPATIENT_CLINIC_OR_DEPARTMENT_OTHER): Payer: Medicare Other | Admitting: Lab

## 2012-01-27 ENCOUNTER — Telehealth: Payer: Self-pay | Admitting: Oncology

## 2012-01-27 ENCOUNTER — Encounter: Payer: Self-pay | Admitting: Oncology

## 2012-01-27 VITALS — BP 168/79 | HR 71 | Temp 99.1°F | Resp 20 | Ht 63.98 in | Wt 175.4 lb

## 2012-01-27 DIAGNOSIS — C569 Malignant neoplasm of unspecified ovary: Secondary | ICD-10-CM | POA: Diagnosis not present

## 2012-01-27 LAB — CA 125: CA 125: 4.3 U/mL (ref 0.0–30.2)

## 2012-01-27 LAB — COMPREHENSIVE METABOLIC PANEL (CC13)
ALT: 11 U/L (ref 0–55)
AST: 18 U/L (ref 5–34)
Albumin: 3.6 g/dL (ref 3.5–5.0)
Alkaline Phosphatase: 59 U/L (ref 40–150)
BUN: 29 mg/dL — ABNORMAL HIGH (ref 7.0–26.0)
CO2: 26 mEq/L (ref 22–29)
Calcium: 10.3 mg/dL (ref 8.4–10.4)
Chloride: 104 mEq/L (ref 98–107)
Creatinine: 1.2 mg/dL — ABNORMAL HIGH (ref 0.6–1.1)
Glucose: 102 mg/dl — ABNORMAL HIGH (ref 70–99)
Potassium: 5.1 mEq/L (ref 3.5–5.1)
Sodium: 138 mEq/L (ref 136–145)
Total Bilirubin: 0.45 mg/dL (ref 0.20–1.20)
Total Protein: 7.1 g/dL (ref 6.4–8.3)

## 2012-01-27 LAB — CBC WITH DIFFERENTIAL/PLATELET
BASO%: 0.9 % (ref 0.0–2.0)
Basophils Absolute: 0 10*3/uL (ref 0.0–0.1)
EOS%: 7.6 % — ABNORMAL HIGH (ref 0.0–7.0)
Eosinophils Absolute: 0.4 10*3/uL (ref 0.0–0.5)
HCT: 35.2 % (ref 34.8–46.6)
HGB: 12 g/dL (ref 11.6–15.9)
LYMPH%: 18.6 % (ref 14.0–49.7)
MCH: 31 pg (ref 25.1–34.0)
MCHC: 34.2 g/dL (ref 31.5–36.0)
MCV: 90.5 fL (ref 79.5–101.0)
MONO#: 0.6 10*3/uL (ref 0.1–0.9)
MONO%: 11.5 % (ref 0.0–14.0)
NEUT#: 3.4 10*3/uL (ref 1.5–6.5)
NEUT%: 61.4 % (ref 38.4–76.8)
Platelets: 220 10*3/uL (ref 145–400)
RBC: 3.89 10*6/uL (ref 3.70–5.45)
RDW: 13.8 % (ref 11.2–14.5)
WBC: 5.5 10*3/uL (ref 3.9–10.3)
lymph#: 1 10*3/uL (ref 0.9–3.3)

## 2012-01-27 NOTE — Progress Notes (Signed)
OFFICE PROGRESS NOTE   01/27/2012   Physicians:P.Gehrig, R.Taavon, K.Little, J.Hayes, G.Rankin   INTERVAL HISTORY:  Patient is seen, alone for visit, in continuing attention to her history of gyn carcinoma, on observation since she completed chemotherapy 07-01-11. She saw Dr Duard Brady in Sept and is to see her back in ~ March 2014. Patient has been feeling extremely well.  Patient had presented with several weeks of bloating and diarrhea in fall 2012, and was found to have ascites and carcinomatosis. Pathology was documented from the malignant ascites 01-16-2011, consistent with serous gyn primary. She required two large volume paracenteses in Dec. She received 3 cycles of neoadjuvant carboplatin/ taxol from 01-27-2011 thru 03-10-11. She was neutropenic cycle 1 and has had gCSF support since then. CA 125 was 239 in Dec and down to 4.9 by early Feb. She went to TAH/BSO/omentectomy by Dr.ClarkePearson on 04-01-2011, with extensive adhesions noted and oozing of ascites fluid from all peritoneal surfaces during that surgery; pathology had no residual cancer apparent. She resumed taxol/carboplatin on 05-12-11, with cycle 5 delayed a week due to family event, and cycle 6 07-01-11 with neulasta 07-02-11.CA 125 was 4.2 on 06-20-11 prior to cycle 6; this was 4.8 when she saw Dr Duard Brady in Sept 2013. Last CT AP was Jan 2013.  Patient tells me that her energy is back to her usual good level. She has had no recent infectious illness, no pain, good appetite, no respiratory or GI symptoms, no abdominal or pelvic discomfort, no bladder symptoms, no bleeding. Remainder of 10 point Review of Systems negative.  Patient's husband has cognitive problems and they are considering a move to [facility]. Objective:  Vital signs in last 24 hours:  BP 168/79  Pulse 71  Temp 99.1 F (37.3 C) (Oral)  Resp 20  Ht 5' 3.98" (1.625 m)  Wt 175 lb 6.4 oz (79.561 kg)  BMI 30.13 kg/m2 Weight is up 9 lbs from June. Easily  ambulatory, talkative, appears comfortable.   HEENT:PERRLA, sclera clear, anicteric and oropharynx clear, no lesions LymphaticsCervical, supraclavicular, and axillary nodes normal. No inguinal adenopathy Resp: clear to auscultation bilaterally and normal percussion bilaterally Cardio: regular rate and rhythm GI: soft, non-tender; bowel sounds normal; no masses,  no organomegaly. Abdomen protuberant but not distended moreso than her usual. Midline incision well-healed Extremities: extremities normal, atraumatic, no cyanosis or edema Neuro:no sensory deficits noted Skin without rash or ecchymosis  Lab Results:  Results for orders placed in visit on 01/27/12  CBC WITH DIFFERENTIAL      Component Value Range   WBC 5.5  3.9 - 10.3 10e3/uL   NEUT# 3.4  1.5 - 6.5 10e3/uL   HGB 12.0  11.6 - 15.9 g/dL   HCT 40.9  81.1 - 91.4 %   Platelets 220  145 - 400 10e3/uL   MCV 90.5  79.5 - 101.0 fL   MCH 31.0  25.1 - 34.0 pg   MCHC 34.2  31.5 - 36.0 g/dL   RBC 7.82  9.56 - 2.13 10e6/uL   RDW 13.8  11.2 - 14.5 %   lymph# 1.0  0.9 - 3.3 10e3/uL   MONO# 0.6  0.1 - 0.9 10e3/uL   Eosinophils Absolute 0.4  0.0 - 0.5 10e3/uL   Basophils Absolute 0.0  0.0 - 0.1 10e3/uL   NEUT% 61.4  38.4 - 76.8 %   LYMPH% 18.6  14.0 - 49.7 %   MONO% 11.5  0.0 - 14.0 %   EOS% 7.6 (*) 0.0 - 7.0 %  BASO% 0.9  0.0 - 2.0 %    CA 125 available after visit 4.3  CMET available after visit normal with exception of bun stable at 29 and creatinine very slightly higher at 1.2  Studies/Results: She has had recent mammogram and bone density scan at [facility], those reports not in this EMR.  Medications: I have reviewed the patient's current medications.  Assessment/Plan: 1.Serous gyn carcinoma: history as above, clinically continues to do well. She will see Dr Duard Brady in ~ March and I will see her in ~ June, labs prior to each. 2.slight increase in creatinine: will encourage her to drink fluids well. Not on diuretics. 3.hx HTN,  GERD, and SIADH with prior surgeries 4.flu vaccine given elsewhere this fall  Patient is comfortable with discussion and plan. Myshawn Chiriboga P, MD   01/27/2012, 9:36 AM

## 2012-01-27 NOTE — Telephone Encounter (Signed)
gv and printed appt schedule for pt for March and June 2014.Marland KitchenMarland KitchenCalled GYN onc and lvm with pt info regarding Dr. Duard Brady appt needed March 2014.

## 2012-01-27 NOTE — Patient Instructions (Signed)
Will get CA 125 in ~ early March, shortly prior to your visit back to Dr Duard Brady.

## 2012-01-28 ENCOUNTER — Telehealth: Payer: Self-pay

## 2012-01-28 NOTE — Telephone Encounter (Signed)
Message copied by Lorine Bears on Wed Jan 28, 2012  5:20 PM ------      Message from: Reece Packer      Created: Tue Jan 27, 2012  8:19 PM       Labs seen and need follow up: please let her know that CA 125 is great at 4.3. Chemistries also look good, tho her creatinine is just a little bit higher than before - she needs to be sure to drink fluids well. I have sent copies of her labs to Dr Catha Gosselin.

## 2012-01-28 NOTE — Telephone Encounter (Signed)
Told [name] the results of her lab work and noted below by Dr. Darrold Span.

## 2012-03-04 DIAGNOSIS — H348392 Tributary (branch) retinal vein occlusion, unspecified eye, stable: Secondary | ICD-10-CM | POA: Diagnosis not present

## 2012-03-04 DIAGNOSIS — H35359 Cystoid macular degeneration, unspecified eye: Secondary | ICD-10-CM | POA: Diagnosis not present

## 2012-03-05 DIAGNOSIS — Z23 Encounter for immunization: Secondary | ICD-10-CM | POA: Diagnosis not present

## 2012-03-05 DIAGNOSIS — C569 Malignant neoplasm of unspecified ovary: Secondary | ICD-10-CM | POA: Diagnosis not present

## 2012-03-05 DIAGNOSIS — N949 Unspecified condition associated with female genital organs and menstrual cycle: Secondary | ICD-10-CM | POA: Diagnosis not present

## 2012-03-05 DIAGNOSIS — Z111 Encounter for screening for respiratory tuberculosis: Secondary | ICD-10-CM | POA: Diagnosis not present

## 2012-03-05 DIAGNOSIS — I10 Essential (primary) hypertension: Secondary | ICD-10-CM | POA: Diagnosis not present

## 2012-03-12 DIAGNOSIS — M7750 Other enthesopathy of unspecified foot: Secondary | ICD-10-CM | POA: Diagnosis not present

## 2012-03-17 ENCOUNTER — Ambulatory Visit (INDEPENDENT_AMBULATORY_CARE_PROVIDER_SITE_OTHER): Payer: Medicare Other | Admitting: Psychiatry

## 2012-03-17 DIAGNOSIS — F0630 Mood disorder due to known physiological condition, unspecified: Secondary | ICD-10-CM

## 2012-03-17 DIAGNOSIS — F4323 Adjustment disorder with mixed anxiety and depressed mood: Secondary | ICD-10-CM | POA: Diagnosis not present

## 2012-03-30 DIAGNOSIS — H10029 Other mucopurulent conjunctivitis, unspecified eye: Secondary | ICD-10-CM | POA: Diagnosis not present

## 2012-03-30 DIAGNOSIS — H2510 Age-related nuclear cataract, unspecified eye: Secondary | ICD-10-CM | POA: Diagnosis not present

## 2012-03-31 ENCOUNTER — Ambulatory Visit (INDEPENDENT_AMBULATORY_CARE_PROVIDER_SITE_OTHER): Payer: Medicare Other | Admitting: Psychiatry

## 2012-03-31 DIAGNOSIS — F0630 Mood disorder due to known physiological condition, unspecified: Secondary | ICD-10-CM | POA: Diagnosis not present

## 2012-03-31 DIAGNOSIS — F4323 Adjustment disorder with mixed anxiety and depressed mood: Secondary | ICD-10-CM | POA: Diagnosis not present

## 2012-04-12 ENCOUNTER — Other Ambulatory Visit (HOSPITAL_BASED_OUTPATIENT_CLINIC_OR_DEPARTMENT_OTHER): Payer: Medicare Other | Admitting: Lab

## 2012-04-12 DIAGNOSIS — C569 Malignant neoplasm of unspecified ovary: Secondary | ICD-10-CM

## 2012-04-12 LAB — CA 125: CA 125: 4.4 U/mL (ref 0.0–30.2)

## 2012-04-13 ENCOUNTER — Ambulatory Visit: Payer: Medicare Other | Admitting: Psychiatry

## 2012-04-13 ENCOUNTER — Telehealth: Payer: Self-pay | Admitting: *Deleted

## 2012-04-13 DIAGNOSIS — G5760 Lesion of plantar nerve, unspecified lower limb: Secondary | ICD-10-CM | POA: Diagnosis not present

## 2012-04-13 NOTE — Telephone Encounter (Signed)
Message copied by Phillis Knack on Tue Apr 13, 2012 11:44 AM ------      Message from: Reece Packer      Created: Tue Apr 13, 2012 11:29 AM       Labs seen and need follow up: please let her know marker is good at 4.4            Cc LA, TH ------

## 2012-04-13 NOTE — Telephone Encounter (Signed)
Left message with results below. Instructed to call if any questions

## 2012-04-14 ENCOUNTER — Ambulatory Visit (INDEPENDENT_AMBULATORY_CARE_PROVIDER_SITE_OTHER): Payer: Medicare Other | Admitting: Psychiatry

## 2012-04-14 DIAGNOSIS — F4323 Adjustment disorder with mixed anxiety and depressed mood: Secondary | ICD-10-CM

## 2012-04-14 DIAGNOSIS — F0630 Mood disorder due to known physiological condition, unspecified: Secondary | ICD-10-CM | POA: Diagnosis not present

## 2012-04-15 DIAGNOSIS — H35039 Hypertensive retinopathy, unspecified eye: Secondary | ICD-10-CM | POA: Diagnosis not present

## 2012-04-15 DIAGNOSIS — H348392 Tributary (branch) retinal vein occlusion, unspecified eye, stable: Secondary | ICD-10-CM | POA: Diagnosis not present

## 2012-04-15 DIAGNOSIS — H35049 Retinal micro-aneurysms, unspecified, unspecified eye: Secondary | ICD-10-CM | POA: Diagnosis not present

## 2012-04-15 DIAGNOSIS — H35359 Cystoid macular degeneration, unspecified eye: Secondary | ICD-10-CM | POA: Diagnosis not present

## 2012-04-28 ENCOUNTER — Ambulatory Visit (INDEPENDENT_AMBULATORY_CARE_PROVIDER_SITE_OTHER): Payer: Medicare Other | Admitting: Psychiatry

## 2012-04-28 DIAGNOSIS — F0630 Mood disorder due to known physiological condition, unspecified: Secondary | ICD-10-CM

## 2012-04-28 DIAGNOSIS — F4323 Adjustment disorder with mixed anxiety and depressed mood: Secondary | ICD-10-CM

## 2012-05-05 ENCOUNTER — Encounter: Payer: Self-pay | Admitting: Gynecologic Oncology

## 2012-05-05 ENCOUNTER — Ambulatory Visit: Payer: Medicare Other | Attending: Gynecologic Oncology | Admitting: Gynecologic Oncology

## 2012-05-05 VITALS — BP 122/76 | HR 60 | Temp 98.5°F | Resp 18 | Ht 63.98 in | Wt 177.8 lb

## 2012-05-05 DIAGNOSIS — K432 Incisional hernia without obstruction or gangrene: Secondary | ICD-10-CM | POA: Diagnosis not present

## 2012-05-05 DIAGNOSIS — N898 Other specified noninflammatory disorders of vagina: Secondary | ICD-10-CM

## 2012-05-05 DIAGNOSIS — Z90710 Acquired absence of both cervix and uterus: Secondary | ICD-10-CM | POA: Diagnosis not present

## 2012-05-05 DIAGNOSIS — C569 Malignant neoplasm of unspecified ovary: Secondary | ICD-10-CM | POA: Insufficient documentation

## 2012-05-05 MED ORDER — NYSTATIN-TRIAMCINOLONE 100000-0.1 UNIT/GM-% EX OINT: TOPICAL_OINTMENT | Freq: Two times a day (BID) | CUTANEOUS | Status: DC | PRN

## 2012-05-05 MED ORDER — NYSTATIN 100000 UNIT/GM EX CREA: 1.0000 "application " | TOPICAL_CREAM | Freq: Three times a day (TID) | CUTANEOUS | Status: DC | PRN

## 2012-05-05 NOTE — Patient Instructions (Signed)
Hernia A hernia occurs when an internal organ pushes out through a weak spot in the abdominal wall. Hernias most commonly occur in the groin and around the navel. Hernias often can be pushed back into place (reduced). Most hernias tend to get worse over time. Some abdominal hernias can get stuck in the opening (irreducible or incarcerated hernia) and cannot be reduced. An irreducible abdominal hernia which is tightly squeezed into the opening is at risk for impaired blood supply (strangulated hernia). A strangulated hernia is a medical emergency. Because of the risk for an irreducible or strangulated hernia, surgery may be recommended to repair a hernia. CAUSES   Heavy lifting.  Prolonged coughing.  Straining to have a bowel movement.  A cut (incision) made during an abdominal surgery. HOME CARE INSTRUCTIONS   Bed rest is not required. You may continue your normal activities.  Avoid lifting more than 10 pounds (4.5 kg) or straining.  Cough gently. If you are a smoker it is best to stop. Even the best hernia repair can break down with the continual strain of coughing. Even if you do not have your hernia repaired, a cough will continue to aggravate the problem.  Do not wear anything tight over your hernia. Do not try to keep it in with an outside bandage or truss. These can damage abdominal contents if they are trapped within the hernia sac.  Eat a normal diet.  Avoid constipation. Straining over long periods of time will increase hernia size and encourage breakdown of repairs. If you cannot do this with diet alone, stool softeners may be used. SEEK IMMEDIATE MEDICAL CARE IF:   You have a fever.  You develop increasing abdominal pain.  You feel nauseous or vomit.  Your hernia is stuck outside the abdomen, looks discolored, feels hard, or is tender.  You have any changes in your bowel habits or in the hernia that are unusual for you.  You have increased pain or swelling around the  hernia.  You cannot push the hernia back in place by applying gentle pressure while lying down. MAKE SURE YOU:   Understand these instructions.  Will watch your condition.  Will get help right away if you are not doing well or get worse. Document Released: 01/27/2005 Document Revised: 04/21/2011 Document Reviewed: 09/16/2007 [employer] Patient Information 2013 [employer].  

## 2012-05-05 NOTE — Progress Notes (Signed)
Consult Note: Gyn-Onc  [name] 79 y.o. female  CC:  Chief Complaint  Patient presents with  . 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 Her illness began with several weeks of diarrhea and abdominal bloating the fall of 2012. She was tested several times for C. difficile was negative. [name] at that time recommended an ultrasound and ultimately a CT scan. Ultrasound showed significant ascites and her CA 125 was markedly elevated which is when we saw her. CT imaging was most consistent with markedly advanced disease that would most likely preclude optimal debulking. This in conjunction with her poor performance status the decision was made to proceed with neoadjuvant chemotherapy. She had a high-volume paracentesis to confirm the diagnosis of a gynecologic malignancy.   She had 3 cycles of neoadjuvant chemotherapy under the care of Dr. Darrold [name] with paclitaxel and carboplatin. Her CA 125 prior cycle #1 was 204 and on January 18 prior to cycle #2 her CA 125 was 12.6. She also had a CT scan that showed marked resolution of her ascites and marked improvement in the omental caking. There is no mention of any significant carcinomatosis. There is no significant adenopathy. The adnexa and uterus are within normal limits.   After discussion of pros and cons she opted for definitive surgical management. On 04/01/2011 she underwent surgery by Dr. Stanford [name]. She underwent exploratory laparotomy TAH/BSO and omental resection. There was no gross evidence of disease. Pathology revealed no residual carcinoma within the omentum. There was stromal fibrosis and chronic inflammation and scattered psammoma bodies consistent with posttreatment effect. Within the right ovary and tube was no residual carcinoma. There was benign ovarian tissue with associated serosal adhesions necrosis and scattered psammoma bodies. This was seen on  the uterine serosa. The uterus was otherwise negative. The left tube and ovary also had associated psammoma bodies but no residual carcinoma identified. She did well postoperatively and after lengthy discussions underwent an additional 3 cycles of chemotherapy which she completed in 06-20-11.  CA-125 at that time was 4.2 and on 07/23/11 it was 3.3. Subsequent CA-125's have been less than 5 with one on 04/12/12.  Interval History/Review of systems: She has not required any pain medication. She does complain of occasional twinge type pain. She's had normal return of bowel and bladder function. . Her energy level is normal. Her appetite is excellent in fact she thinks that she's eating too much and she has gained 8-9# since I last saw her in September.  She does occasionally have some vaginal irritation. She was seen by [name] for this and was prescribed some nystatin and nystatin with triamcinolone cream. The nystatin does seem to help her symptoms. She has no changes in her bladder or bowel habits. She denies any chest pain or shortness of breath. She complains of an asymmetry in her abdomen that has been evaluated by her physician and she was told that it was normal. She was doing water walking class III days a week, but has not been going recently due to the weather. She has been much more sedentary recently due to the weather, planning a move to a retirement home and helping her husband.  She's been busier taking care of her husband. She states that the "roles have been reversed". His memory is declining she's responsible for more of his care. She is very grateful that she's feeling as well as she is  that she can do that. The remainder for 10 point system review is negative.   Current Meds:  Outpatient Encounter Prescriptions as of 05/05/2012  Medication Sig Dispense Refill  . azelastine (ASTELIN) 137 MCG/SPRAY nasal spray Place 1 spray into the nose as needed. Use in each nostril as directed      .  Calcium Citrate-Vitamin D (CITRACAL + D PO) Take 2 tablets by mouth daily.      . Cholecalciferol (VITAMIN D) 2000 UNITS tablet Take 2,000 Units by mouth daily.       Marland Kitchen ibuprofen (ADVIL,MOTRIN) 200 MG tablet Take 200 mg by mouth every 6 (six) hours as needed. pain      . lisinopril (PRINIVIL,ZESTRIL) 10 MG tablet Take 10 mg by mouth daily after breakfast.       . Multiple Vitamins-Minerals (CENTRUM SILVER PO) Take 1 tablet by mouth daily.       . nadolol (CORGARD) 20 MG tablet Take 10 mg by mouth daily after breakfast.       . nystatin cream (MYCOSTATIN) Apply 1 application topically 3 (three) times daily as needed. Applies to Outside of vagina and anus for Itching  30 g  1  . omega-3 acid ethyl esters (LOVAZA) 1 G capsule Take 1 g by mouth daily.      Marland Kitchen omeprazole (PRILOSEC) 20 MG capsule Take 20 mg by mouth Daily.      Bertram Gala Glycol-Propyl Glycol (SYSTANE) 0.4-0.3 % SOLN Place 1 drop into both eyes 4 (four) times daily.      . Ranibizumab (LUCENTIS IO) Place into the left eye once as needed. Pt receive from dr. Luciana [name] office [phone]      . vitamin E (VITAMIN E) 400 UNIT capsule Take 400 Units by mouth daily.      Marland Kitchen zolpidem (AMBIEN) 10 MG tablet Take 10 mg by mouth at bedtime.       . [DISCONTINUED] nystatin cream (MYCOSTATIN) Apply 1 application topically 3 (three) times daily as needed. Applies to Outside of vagina and anus for Itching      . nystatin-triamcinolone ointment (MYCOLOG) Apply topically 2 (two) times daily as needed.  30 g  1  . [DISCONTINUED] acetaminophen (TYLENOL) 325 MG tablet Take 650 mg by mouth every 4 (four) hours as needed. Pain       . [DISCONTINUED] Hypertonic Nasal Wash (SINUS RINSE BOTTLE KIT NA) Place 1 spray into the nose 2 (two) times daily as needed. Allergies        No facility-administered encounter medications on file as of 05/05/2012.    Allergy:  Allergies  Allergen Reactions  . Morphine And Related     Given after knee replacement and code  blue occurred  . Penicillins     REACTION: hives  . Shellfish Allergy     Pt has shellfish allergy only.  Has had IV contrast x 2 and did fine.    Social Hx:   History   Social History  . Marital Status: Married    Spouse Name: N/A    Number of Children: N/A  . Years of Education: N/A   Occupational History  . Not on file.   Social History Main Topics  . Smoking status: Former Smoker    Quit date: 02/11/1952  . Smokeless tobacco: Never Used  . Alcohol Use: No  . Drug Use: No  . Sexually Active: No   Other Topics Concern  . Not on file   Social History Narrative  . No  narrative on file    Past Surgical Hx:  Past Surgical History  Procedure Laterality Date  . Tonsillectomy    . Appendectomy    . Joint replacement      R knee in 2008, 5 operations on L knee  . Other surgical history      hx of C section 1966  . Laparotomy  04/01/2011    Procedure: EXPLORATORY LAPAROTOMY;  Surgeon: Jennifer Corpus, MD;  Location: [facility];  Service: Gynecology;  Laterality: N/A;  . Abdominal hysterectomy  04/01/2011    Procedure: HYSTERECTOMY ABDOMINAL;  Surgeon: Jennifer Corpus, MD;  Location: [facility];  Service: Gynecology;  Laterality: N/A;  . Salpingoophorectomy  04/01/2011    Procedure: SALPINGO OOPHERECTOMY;  Surgeon: Jennifer Corpus, MD;  Location: [facility];  Service: Gynecology;  Laterality: Bilateral;    Past Medical Hx:  Past Medical History  Diagnosis Date  . Ascites   . Cystitis   . GERD (gastroesophageal reflux disease)   . Hypertension   . Hyponatremia   . Hyperlipidemia   . Osteopenia   . Postmenopausal atrophic vaginitis   . Vulvitis   . Staph infection 2010    after knee replacement  . Urinary frequency   . Complication of anesthesia     Sodium drops per pt   . Pneumonia     hx of   . Anemia     hx of   . Blood transfusion     hx of 2011   . H/O hiatal hernia   . Arthritis     knees   . Ovarian cancer 01/25/2011    Family Hx:   Family History  Problem Relation Age of Onset  . Heart attack Brother   . Diabetes Brother   . Cancer Other     Bladder cancer    Vitals:  Blood pressure 122/76, pulse 60, temperature 98.5 F (36.9 C), resp. rate 18, height 5' 3.98" (1.625 m), weight 177 lb 12.8 oz (80.65 kg).  Physical Exam:  Well-nourished well-developed female in no acute distress.  Neck: Supple, no lymphadenopathy, no thyromegaly.  Lungs: Clear to auscultation bilaterally.  Cardiovascular: Regular rate and rhythm.  Abdomen: Well-healed vertical incision. She has a reducible, non tender incisional hernia towards the left side of her incision and a palpable 4 cm opening in the fascia is appreciated. Abdomen is soft, nontender, nondistended.  Groins: No lymphadenopathy.  Extremities: No edema.  Pelvic: Atrophic external genitalia. There were no palpable masses on bimanual examination.  There is no nodularity. Rectal confirms.  Assessment/Plan: 79 year old diagnosed with advanced serous either primary peritoneal or ovarian carcinoma in December of 2012. Based on CT findings she was treated with neoadjuvant chemotherapy with carboplatin and paclitaxel from 01/27/2011 through March 10 2011. Her CA125 responded nicely as did her imaging. She had an optimal cytoreductive surgery by Dr. Stanford [name]. Final pathology revealed no evidence of disease. In an additional 3 cycles of chemotherapy after her debulking surgery were given and she  has had no evidence of disease since that time. Her clinical exam today is unremarkable. Her CA-125s are also negative. She has a reducible incisional hernia and we discussed this and that in the absence of sx and as it is reducible, I feel that she is at low risk for incarceration. She was given signs and symptoms to look out for and will call us or her other physicians if she experiences anything.  She'll follow up with Dr. Darrold [name] is scheduled in 2-3  months and we'll see her 2-3  months after that appointment.  [name] A., MD 05/05/2012, 9:28 AM

## 2012-05-12 ENCOUNTER — Ambulatory Visit (INDEPENDENT_AMBULATORY_CARE_PROVIDER_SITE_OTHER): Payer: Medicare Other | Admitting: Psychiatry

## 2012-05-12 DIAGNOSIS — F4323 Adjustment disorder with mixed anxiety and depressed mood: Secondary | ICD-10-CM | POA: Diagnosis not present

## 2012-05-12 DIAGNOSIS — F0630 Mood disorder due to known physiological condition, unspecified: Secondary | ICD-10-CM | POA: Diagnosis not present

## 2012-05-14 DIAGNOSIS — Z1331 Encounter for screening for depression: Secondary | ICD-10-CM | POA: Diagnosis not present

## 2012-05-14 DIAGNOSIS — I10 Essential (primary) hypertension: Secondary | ICD-10-CM | POA: Diagnosis not present

## 2012-05-14 DIAGNOSIS — E782 Mixed hyperlipidemia: Secondary | ICD-10-CM | POA: Diagnosis not present

## 2012-05-26 ENCOUNTER — Ambulatory Visit: Payer: Medicare Other | Admitting: Psychiatry

## 2012-05-27 ENCOUNTER — Ambulatory Visit (INDEPENDENT_AMBULATORY_CARE_PROVIDER_SITE_OTHER): Payer: Medicare Other | Admitting: Psychiatry

## 2012-05-27 DIAGNOSIS — F4323 Adjustment disorder with mixed anxiety and depressed mood: Secondary | ICD-10-CM

## 2012-05-27 DIAGNOSIS — F0630 Mood disorder due to known physiological condition, unspecified: Secondary | ICD-10-CM

## 2012-06-01 DIAGNOSIS — H35359 Cystoid macular degeneration, unspecified eye: Secondary | ICD-10-CM | POA: Diagnosis not present

## 2012-06-01 DIAGNOSIS — H348392 Tributary (branch) retinal vein occlusion, unspecified eye, stable: Secondary | ICD-10-CM | POA: Diagnosis not present

## 2012-06-09 ENCOUNTER — Ambulatory Visit (INDEPENDENT_AMBULATORY_CARE_PROVIDER_SITE_OTHER): Payer: Medicare Other | Admitting: Psychiatry

## 2012-06-09 DIAGNOSIS — F0630 Mood disorder due to known physiological condition, unspecified: Secondary | ICD-10-CM | POA: Diagnosis not present

## 2012-06-09 DIAGNOSIS — F4323 Adjustment disorder with mixed anxiety and depressed mood: Secondary | ICD-10-CM

## 2012-06-22 ENCOUNTER — Ambulatory Visit (INDEPENDENT_AMBULATORY_CARE_PROVIDER_SITE_OTHER): Payer: Medicare Other | Admitting: Psychiatry

## 2012-06-22 DIAGNOSIS — F4323 Adjustment disorder with mixed anxiety and depressed mood: Secondary | ICD-10-CM | POA: Diagnosis not present

## 2012-06-22 DIAGNOSIS — F0630 Mood disorder due to known physiological condition, unspecified: Secondary | ICD-10-CM | POA: Diagnosis not present

## 2012-07-06 ENCOUNTER — Ambulatory Visit: Payer: Medicare Other | Admitting: Psychiatry

## 2012-07-08 DIAGNOSIS — H348392 Tributary (branch) retinal vein occlusion, unspecified eye, stable: Secondary | ICD-10-CM | POA: Diagnosis not present

## 2012-07-08 DIAGNOSIS — H35359 Cystoid macular degeneration, unspecified eye: Secondary | ICD-10-CM | POA: Diagnosis not present

## 2012-07-08 DIAGNOSIS — H35049 Retinal micro-aneurysms, unspecified, unspecified eye: Secondary | ICD-10-CM | POA: Diagnosis not present

## 2012-07-08 DIAGNOSIS — H35039 Hypertensive retinopathy, unspecified eye: Secondary | ICD-10-CM | POA: Diagnosis not present

## 2012-07-13 ENCOUNTER — Ambulatory Visit: Payer: Medicare Other | Admitting: Oncology

## 2012-07-13 ENCOUNTER — Other Ambulatory Visit: Payer: Medicare Other | Admitting: Lab

## 2012-07-13 ENCOUNTER — Ambulatory Visit (HOSPITAL_BASED_OUTPATIENT_CLINIC_OR_DEPARTMENT_OTHER): Payer: Medicare Other | Admitting: Lab

## 2012-07-13 ENCOUNTER — Ambulatory Visit: Payer: Medicare Other | Admitting: Lab

## 2012-07-13 DIAGNOSIS — C569 Malignant neoplasm of unspecified ovary: Secondary | ICD-10-CM

## 2012-07-13 LAB — CBC WITH DIFFERENTIAL/PLATELET
BASO%: 0.8 % (ref 0.0–2.0)
Basophils Absolute: 0 10*3/uL (ref 0.0–0.1)
EOS%: 3.2 % (ref 0.0–7.0)
Eosinophils Absolute: 0.2 10*3/uL (ref 0.0–0.5)
HCT: 35 % (ref 34.8–46.6)
HGB: 12.1 g/dL (ref 11.6–15.9)
LYMPH%: 22.1 % (ref 14.0–49.7)
MCH: 30.3 pg (ref 25.1–34.0)
MCHC: 34.7 g/dL (ref 31.5–36.0)
MCV: 87.4 fL (ref 79.5–101.0)
MONO#: 0.6 10*3/uL (ref 0.1–0.9)
MONO%: 11.2 % (ref 0.0–14.0)
NEUT#: 3.3 10*3/uL (ref 1.5–6.5)
NEUT%: 62.7 % (ref 38.4–76.8)
Platelets: 161 10*3/uL (ref 145–400)
RBC: 4.01 10*6/uL (ref 3.70–5.45)
RDW: 14.8 % — ABNORMAL HIGH (ref 11.2–14.5)
WBC: 5.3 10*3/uL (ref 3.9–10.3)
lymph#: 1.2 10*3/uL (ref 0.9–3.3)

## 2012-07-13 LAB — COMPREHENSIVE METABOLIC PANEL (CC13)
ALT: 13 U/L (ref 0–55)
AST: 20 U/L (ref 5–34)
Albumin: 3.6 g/dL (ref 3.5–5.0)
Alkaline Phosphatase: 56 U/L (ref 40–150)
BUN: 31.1 mg/dL — ABNORMAL HIGH (ref 7.0–26.0)
CO2: 26 mEq/L (ref 22–29)
Calcium: 10 mg/dL (ref 8.4–10.4)
Chloride: 101 mEq/L (ref 98–107)
Creatinine: 1.1 mg/dL (ref 0.6–1.1)
Glucose: 109 mg/dl — ABNORMAL HIGH (ref 70–99)
Potassium: 4.5 mEq/L (ref 3.5–5.1)
Sodium: 135 mEq/L — ABNORMAL LOW (ref 136–145)
Total Bilirubin: 0.55 mg/dL (ref 0.20–1.20)
Total Protein: 7.1 g/dL (ref 6.4–8.3)

## 2012-07-14 ENCOUNTER — Ambulatory Visit: Payer: Medicare Other | Admitting: Lab

## 2012-07-14 ENCOUNTER — Telehealth: Payer: Self-pay | Admitting: *Deleted

## 2012-07-14 LAB — CA 125: CA 125: 4.6 U/mL (ref 0.0–30.2)

## 2012-07-14 NOTE — Telephone Encounter (Signed)
Pt notified of lab results. CA 125, CBC, CMET

## 2012-07-15 DIAGNOSIS — M1710 Unilateral primary osteoarthritis, unspecified knee: Secondary | ICD-10-CM | POA: Diagnosis not present

## 2012-07-15 DIAGNOSIS — IMO0002 Reserved for concepts with insufficient information to code with codable children: Secondary | ICD-10-CM | POA: Diagnosis not present

## 2012-07-27 ENCOUNTER — Other Ambulatory Visit: Payer: Medicare Other | Admitting: Lab

## 2012-07-27 ENCOUNTER — Ambulatory Visit: Payer: Medicare Other | Admitting: Oncology

## 2012-08-09 ENCOUNTER — Encounter: Payer: Self-pay | Admitting: Oncology

## 2012-08-09 ENCOUNTER — Telehealth: Payer: Self-pay | Admitting: Oncology

## 2012-08-09 ENCOUNTER — Ambulatory Visit (HOSPITAL_BASED_OUTPATIENT_CLINIC_OR_DEPARTMENT_OTHER): Payer: Medicare Other | Admitting: Oncology

## 2012-08-09 VITALS — BP 146/63 | HR 65 | Temp 98.7°F | Resp 20 | Ht 63.98 in | Wt 174.9 lb

## 2012-08-09 DIAGNOSIS — C569 Malignant neoplasm of unspecified ovary: Secondary | ICD-10-CM

## 2012-08-09 NOTE — Progress Notes (Signed)
OFFICE PROGRESS NOTE   08/09/2012   Physicians:P.Gehrig, R.Taavon, K.Little, J.Hayes, G.Rankin   INTERVAL HISTORY:  Patient is seen, alone for visit, in continuing attention to her history of gyn carcinoma, this IIIC primary peritoneal vs ovarian, for which she is on observation since completing chemotherapy 07-01-11. She saw Dr Duard Brady last on 05-05-12 and will see her again in Sept; last imaging was CT AP Jan 2013. She continues to feel extremely well, with no complaints that seem related to the gyn cancer or that treatment other than mostly asymptomatic large ventral hernia. She does not have PAC.  ONCOLOGIC HISTORY Patient had presented with several weeks of bloating and diarrhea in fall 2012, and was found to have ascites and carcinomatosis. Pathology was documented from the malignant ascites 01-16-2011, consistent with serous gyn primary. She required two large volume paracenteses in Dec. She received 3 cycles of neoadjuvant carboplatin/ taxol from 01-27-2011 thru 03-10-11, with gCSF support required. CA 125 was 239 in Dec 2012 and down to 4.9 by early February 2013. CT AP Jan 2013 showed resolution of ascites and significant improvement in omental disease. She went to TAH/BSO/omentectomy by Dr.ClarkePearson on 04-01-2011, with extensive adhesions noted and oozing of ascites fluid from all peritoneal surfaces during that surgery; pathology had no residual cancer apparent. She resumed taxol/carboplatin for an additional 3 cycles from  05-12-11 thru 07-01-2011 and has been on observation since then. Last CA 125 was 4.4 on 04-12-2012.  Review of Systems Excellent energy. No abdominal or pelvic discomfort, no GI problems, no LE swelling. The ventral hernia is not uncomfortable and not noticeable with styles of dress that are easy for her. No recent infectious illness. No shortness of breath or other respiratory symptoms. No bleeding. Able to sleep. She is much more comfortable with husband's memory decline  with support available in this living situation. Remainder of 10 point Review of Systems negative.  Patient and husband moved to [facility] a few weeks ago and are delighted with this.  Objective:  Vital signs in last 24 hours:  BP 146/63  Pulse 65  Temp(Src) 98.7 F (37.1 C) (Oral)  Resp 20  Ht 5' 3.98" (1.625 m)  Wt 174 lb 14.4 oz (79.334 kg)  BMI 30.04 kg/m2  Weight is stable from Dec 2013. Easily mobile, talkative and cheerful, looks entirely comfortable  HEENT:PERRLA, sclera clear, anicteric and oropharynx clear, no lesions. No alopecia. LymphaticsCervical, supraclavicular, and axillary nodes normal. No inguinal adenopathy Resp: clear to auscultation bilaterally and normal percussion bilaterally Cardio: regular rate and rhythm GI: soft, nontender, normal bowel sounds, no appreciable HSM. Large ventral hernia to left of midline, soft and nontender, more prominent than when I saw her last 6 months ago. Extremities: extremities normal, atraumatic, no cyanosis or edema Neuro:no sensory deficits noted Skin without rash or ecchymosis She does not have central catheter  Lab Results:  Results for orders placed in visit on 07/13/12  CBC WITH DIFFERENTIAL      Result Value Range   WBC 5.3  3.9 - 10.3 10e3/uL   NEUT# 3.3  1.5 - 6.5 10e3/uL   HGB 12.1  11.6 - 15.9 g/dL   HCT 47.8  29.5 - 62.1 %   Platelets 161  145 - 400 10e3/uL   MCV 87.4  79.5 - 101.0 fL   MCH 30.3  25.1 - 34.0 pg   MCHC 34.7  31.5 - 36.0 g/dL   RBC 3.08  6.57 - 8.46 10e6/uL   RDW 14.8 (*) 11.2 -  14.5 %   lymph# 1.2  0.9 - 3.3 10e3/uL   MONO# 0.6  0.1 - 0.9 10e3/uL   Eosinophils Absolute 0.2  0.0 - 0.5 10e3/uL   Basophils Absolute 0.0  0.0 - 0.1 10e3/uL   NEUT% 62.7  38.4 - 76.8 %   LYMPH% 22.1  14.0 - 49.7 %   MONO% 11.2  0.0 - 14.0 %   EOS% 3.2  0.0 - 7.0 %   BASO% 0.8  0.0 - 2.0 %  CA 125      Result Value Range   CA 125 4.6  0.0 - 30.2 U/mL  COMPREHENSIVE METABOLIC PANEL (CC13)       Result Value Range   Sodium 135 (*) 136 - 145 mEq/L   Potassium 4.5  3.5 - 5.1 mEq/L   Chloride 101  98 - 107 mEq/L   CO2 26  22 - 29 mEq/L   Glucose 109 (*) 70 - 99 mg/dl   BUN 45.4 (*) 7.0 - 09.8 mg/dL   Creatinine 1.1  0.6 - 1.1 mg/dL   Total Bilirubin 1.19  0.20 - 1.20 mg/dL   Alkaline Phosphatase 56  40 - 150 U/L   AST 20  5 - 34 U/L   ALT 13  0 - 55 U/L   Total Protein 7.1  6.4 - 8.3 g/dL   Albumin 3.6  3.5 - 5.0 g/dL   Calcium 14.7  8.4 - 82.9 mg/dL    CA 562 available after visit today 4.6  Studies/Results:  Mammograms 01-01-2012 at [facility] noted.  Medications: I have reviewed the patient's current medications.  Patient is aware that gyn cancers frequently do recur, but certainly she appears to be doing very well and we are all hopeful that this will continue.  Assessment/Plan:  1.Serous gyn carcinoma: history as above, clinically continues to do well. She will see Dr Duard Brady in Sept 2014, with cbc, cmet, ca125 same day or just prior to the visit with copies to Dr Duard Brady. Medical oncology follow up has not been scheduled, tho certainly this office is glad to see again if requested by gyn onc or by patient. 2.apparent ventral hernia: she discussed surgical repair at last visit to Dr Duard Brady, but does not want another surgery if not necessary now. We mentioned using support garments. 3.hx HTN, GERD, and SIADH with prior surgeries  Patient is in agreement with plan and has had questions answered to her satisfaction. She expresses appreciation for care.   LIVESAY,LENNIS P, MD   08/09/2012, 12:51 PM

## 2012-08-09 NOTE — Telephone Encounter (Signed)
gv and printed appt sched and avs for pt  °

## 2012-08-24 DIAGNOSIS — L259 Unspecified contact dermatitis, unspecified cause: Secondary | ICD-10-CM | POA: Diagnosis not present

## 2012-08-24 DIAGNOSIS — L570 Actinic keratosis: Secondary | ICD-10-CM | POA: Diagnosis not present

## 2012-08-24 DIAGNOSIS — D2360 Other benign neoplasm of skin of unspecified upper limb, including shoulder: Secondary | ICD-10-CM | POA: Diagnosis not present

## 2012-08-26 DIAGNOSIS — H353290 Exudative age-related macular degeneration, unspecified eye, stage unspecified: Secondary | ICD-10-CM | POA: Diagnosis not present

## 2012-08-26 DIAGNOSIS — H35359 Cystoid macular degeneration, unspecified eye: Secondary | ICD-10-CM | POA: Diagnosis not present

## 2012-09-02 DIAGNOSIS — Z1331 Encounter for screening for depression: Secondary | ICD-10-CM | POA: Diagnosis not present

## 2012-09-02 DIAGNOSIS — E782 Mixed hyperlipidemia: Secondary | ICD-10-CM | POA: Diagnosis not present

## 2012-09-02 DIAGNOSIS — I10 Essential (primary) hypertension: Secondary | ICD-10-CM | POA: Diagnosis not present

## 2012-10-01 DIAGNOSIS — B373 Candidiasis of vulva and vagina: Secondary | ICD-10-CM | POA: Diagnosis not present

## 2012-10-18 ENCOUNTER — Ambulatory Visit (INDEPENDENT_AMBULATORY_CARE_PROVIDER_SITE_OTHER): Payer: Self-pay | Admitting: Surgery

## 2012-10-21 ENCOUNTER — Encounter: Payer: Self-pay | Admitting: Gynecologic Oncology

## 2012-10-21 ENCOUNTER — Other Ambulatory Visit (HOSPITAL_BASED_OUTPATIENT_CLINIC_OR_DEPARTMENT_OTHER): Payer: Medicare Other | Admitting: Lab

## 2012-10-21 ENCOUNTER — Ambulatory Visit: Payer: Medicare Other | Attending: Gynecologic Oncology | Admitting: Gynecologic Oncology

## 2012-10-21 VITALS — BP 158/82 | HR 69 | Temp 98.6°F | Resp 16 | Ht 63.98 in | Wt 173.0 lb

## 2012-10-21 DIAGNOSIS — C569 Malignant neoplasm of unspecified ovary: Secondary | ICD-10-CM

## 2012-10-21 LAB — CBC WITH DIFFERENTIAL/PLATELET
BASO%: 0.6 % (ref 0.0–2.0)
Basophils Absolute: 0 10*3/uL (ref 0.0–0.1)
EOS%: 2.5 % (ref 0.0–7.0)
Eosinophils Absolute: 0.2 10*3/uL (ref 0.0–0.5)
HCT: 34.9 % (ref 34.8–46.6)
HGB: 12 g/dL (ref 11.6–15.9)
LYMPH%: 18.3 % (ref 14.0–49.7)
MCH: 30.2 pg (ref 25.1–34.0)
MCHC: 34.4 g/dL (ref 31.5–36.0)
MCV: 87.8 fL (ref 79.5–101.0)
MONO#: 0.7 10*3/uL (ref 0.1–0.9)
MONO%: 10.5 % (ref 0.0–14.0)
NEUT#: 4.3 10*3/uL (ref 1.5–6.5)
NEUT%: 68.1 % (ref 38.4–76.8)
Platelets: 194 10*3/uL (ref 145–400)
RBC: 3.98 10*6/uL (ref 3.70–5.45)
RDW: 14 % (ref 11.2–14.5)
WBC: 6.4 10*3/uL (ref 3.9–10.3)
lymph#: 1.2 10*3/uL (ref 0.9–3.3)

## 2012-10-21 LAB — COMPREHENSIVE METABOLIC PANEL (CC13)
ALT: 10 U/L (ref 0–55)
AST: 18 U/L (ref 5–34)
Albumin: 3.5 g/dL (ref 3.5–5.0)
Alkaline Phosphatase: 52 U/L (ref 40–150)
BUN: 29.5 mg/dL — ABNORMAL HIGH (ref 7.0–26.0)
CO2: 26 mEq/L (ref 22–29)
Calcium: 10.2 mg/dL (ref 8.4–10.4)
Chloride: 103 mEq/L (ref 98–109)
Creatinine: 1.1 mg/dL (ref 0.6–1.1)
Glucose: 110 mg/dl (ref 70–140)
Potassium: 5 mEq/L (ref 3.5–5.1)
Sodium: 137 mEq/L (ref 136–145)
Total Bilirubin: 0.46 mg/dL (ref 0.20–1.20)
Total Protein: 7.3 g/dL (ref 6.4–8.3)

## 2012-10-21 NOTE — Progress Notes (Signed)
Sounds like the patient does not want to have surgery, but I will see the patient and see what she wants to do.  These do not get better on their own and can affect quality of life, but is obviously her decision.  Does not sound like she has episodes of strangulation/incarceration yet

## 2012-10-21 NOTE — Progress Notes (Signed)
Actually this patient does want her hernia repaired.

## 2012-10-21 NOTE — Progress Notes (Signed)
Consult Note: Gyn-Onc  [name] 79 y.o. female  CC:  Chief Complaint  Patient presents with  . 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 Her illness began with several weeks of diarrhea and abdominal bloating the fall of 2012. She was tested several times for C. difficile was negative. Dr. Billy Coast at that time recommended an ultrasound and ultimately a CT scan. Ultrasound showed significant ascites and her CA 125 was markedly elevated which is when we saw her. CT imaging was most consistent with markedly advanced disease that would most likely preclude optimal debulking. This in conjunction with her poor performance status the decision was made to proceed with neoadjuvant chemotherapy. She had a high-volume paracentesis to confirm the diagnosis of a gynecologic malignancy.   She had 3 cycles of neoadjuvant chemotherapy under the care of Dr. Darrold Span with paclitaxel and carboplatin. Her CA 125 prior cycle #1 was 204 and on February 28, 2011 prior to cycle #2 her CA 125 was 12.6. She also had a CT scan that showed marked resolution of her ascites and marked improvement in the omental caking. There is no mention of any significant carcinomatosis. There is no significant adenopathy. The adnexa and uterus are within normal limits.  After discussion of pros and cons she opted for definitive surgical management. On 04/01/2011 she underwent surgery by Dr. Stanford Breed. She underwent exploratory laparotomy TAH/BSO and omental resection. There was no gross evidence of disease. Pathology revealed no residual carcinoma within the omentum. There was stromal fibrosis and chronic inflammation and scattered psammoma bodies consistent with posttreatment effect. Within the right ovary and tube was no residual carcinoma. There was benign ovarian tissue with associated serosal adhesions necrosis and scattered psammoma bodies. This was seen  on the uterine serosa. The uterus was otherwise negative. The left tube and ovary also had associated psammoma bodies but no residual carcinoma identified. She did well postoperatively and after lengthy discussions underwent an additional 3 cycles of chemotherapy which she completed in 06-20-11. CA-125 at that time was 4.2 and on 07/23/11 it was 3.3. Subsequent CA-125's have been less than 5 with one on 07/13/12 and labs pending from today.  Interval History: The most notable history is that in August her husband fell while playing ping-pong and hit his head. He had a large intracerebral bleed and it is unclear whether he had a stroke, an aneurysm rupture or related to trauma. They decided to withdraw. They have been married normal 60 years. Her husband was about to turn 74. This is been very stressful for her. She recently saw Dr. Billy Coast and he felt that she should see a surgeon regarding her hernia. She has an appointment to see Dr. Michaell Cowing on September 24. She states that she does not want to have it repaired but will followup with physician suggestion. She states this over time she does believe it has gotten bigger. It is not causing her any pain. She is staying very busy she believes that she needs treatment more water. She's doing water exercise classes at her living facility 3 days a week is taking yoga and walking. She is eating well she does feel that she drinks too much coffee once to cut back on that. She's having some issues with cutaneous yeast infections and is using the Gold Bond powder with some relief.   Review of Systems:  Constitutional: Denies fever.No weight loss. Skin: No rash,  sores, jaundice, itching, or dryness.  Cardiovascular: No chest pain, shortness of breath, or edema  Pulmonary: No cough or wheeze.  Gastro Intestinal:No nausea, vomiting, constipation, or diarrhea reported. No bright red blood per rectum or change in bowel movement.  Genitourinary: No frequency, urgency, or  dysuria.  Denies vaginal bleeding and discharge.  Musculoskeletal: No myalgia, arthralgia, joint swelling or pain.  Neurologic: No weakness, numbness, or change in gait.  Psychology: Tearful  Current Meds:  Outpatient Encounter Prescriptions as of 10/21/2012  Medication Sig Dispense Refill  . azelastine (ASTELIN) 137 MCG/SPRAY nasal spray Place 1 spray into the nose as needed. Use in each nostril as directed      . Calcium Citrate-Vitamin D (CITRACAL + D PO) Take 2 tablets by mouth daily.      . Cholecalciferol (VITAMIN D) 2000 UNITS tablet Take 2,000 Units by mouth daily.       Marland Kitchen ibuprofen (ADVIL,MOTRIN) 200 MG tablet Take 200 mg by mouth every 6 (six) hours as needed. pain      . lisinopril (PRINIVIL,ZESTRIL) 10 MG tablet Take 10 mg by mouth daily after breakfast.       . Multiple Vitamins-Minerals (CENTRUM SILVER PO) Take 1 tablet by mouth daily.       . nadolol (CORGARD) 20 MG tablet Take 10 mg by mouth daily after breakfast.       . nystatin cream (MYCOSTATIN) Apply 1 application topically 3 (three) times daily as needed. Applies to Outside of vagina and anus for Itching  30 g  1  . nystatin-triamcinolone ointment (MYCOLOG) Apply topically 2 (two) times daily as needed.  30 g  1  . omega-3 acid ethyl esters (LOVAZA) 1 G capsule Take 1 g by mouth daily.      Marland Kitchen omeprazole (PRILOSEC) 20 MG capsule Take 20 mg by mouth Daily.      Marland Kitchen PATADAY 0.2 % SOLN       . Polyethyl Glycol-Propyl Glycol (SYSTANE) 0.4-0.3 % SOLN Place 1 drop into both eyes 4 (four) times daily.      . Ranibizumab (LUCENTIS IO) Place into the left eye once as needed. Pt receive from dr. Luciana Axe office [phone]      . Red Yeast Rice Extract (RED YEAST RICE PO) Take 1 capsule by mouth 2 (two) times daily.      . vitamin E (VITAMIN E) 400 UNIT capsule Take 400 Units by mouth daily.      Marland Kitchen zolpidem (AMBIEN) 10 MG tablet Take 10 mg by mouth at bedtime.        No facility-administered encounter medications on file as of  10/21/2012.    Allergy:  Allergies  Allergen Reactions  . Morphine And Related     Given after knee replacement and code blue occurred  . Penicillins     REACTION: hives  . Shellfish Allergy     Pt has shellfish allergy only.  Has had IV contrast x 2 and did fine.    Social Hx:   History   Social History  . Marital Status: Married    Spouse Name: N/A    Number of Children: N/A  . Years of Education: N/A   Occupational History  . Not on file.   Social History Main Topics  . Smoking status: Former Smoker    Quit date: 02/11/1952  . Smokeless tobacco: Never Used  . Alcohol Use: No  . Drug Use: No  . Sexual Activity: No   Other Topics Concern  .  Not on file   Social History Narrative  . No narrative on file    Past Surgical Hx:  Past Surgical History  Procedure Laterality Date  . Tonsillectomy    . Appendectomy    . Joint replacement      R knee in 2008, 5 operations on L knee  . Other surgical history      hx of C section 1966  . Laparotomy  04/01/2011    Procedure: EXPLORATORY LAPAROTOMY;  Surgeon: Jeannette Corpus, MD;  Location: [facility];  Service: Gynecology;  Laterality: N/A;  . Abdominal hysterectomy  04/01/2011    Procedure: HYSTERECTOMY ABDOMINAL;  Surgeon: Jeannette Corpus, MD;  Location: [facility];  Service: Gynecology;  Laterality: N/A;  . Salpingoophorectomy  04/01/2011    Procedure: SALPINGO OOPHERECTOMY;  Surgeon: Jeannette Corpus, MD;  Location: [facility];  Service: Gynecology;  Laterality: Bilateral;    Past Medical Hx:  Past Medical History  Diagnosis Date  . Ascites   . Cystitis   . GERD (gastroesophageal reflux disease)   . Hypertension   . Hyponatremia   . Hyperlipidemia   . Osteopenia   . Postmenopausal atrophic vaginitis   . Vulvitis   . Staph infection 2010    after knee replacement  . Urinary frequency   . Complication of anesthesia     Sodium drops per pt   . Pneumonia     hx of   . Anemia     hx of   .  Blood transfusion     hx of 2011   . H/O hiatal hernia   . Arthritis     knees   . Ovarian cancer 01/25/2011    Oncology Hx:   No history exists.    Family Hx:  Family History  Problem Relation Age of Onset  . Heart attack Brother   . Diabetes Brother   . Cancer Other     Bladder cancer    Vitals:  Blood pressure 158/82, pulse 69, temperature 98.6 F (37 C), temperature source Oral, resp. rate 16, height 5' 3.98" (1.625 m), weight 173 lb (78.472 kg), SpO2 100.00%.  Physical Exam:  Well-nourished well-developed female in no acute distress.   Neck: Supple, no lymphadenopathy, no thyromegaly.   Lungs: Clear to auscultation bilaterally.   Cardiovascular: Regular rate and rhythm.   Abdomen: Well-healed vertical incision. She has a reducible, non tender incisional hernia towards the left side of her incision and a palpable 5 cm opening in the fascia is appreciated. Easily reducible. Abdomen is soft, nontender, nondistended.   Groins: No lymphadenopathy.   Extremities: No edema.   Pelvic: Atrophic external genitalia. There were no palpable masses on bimanual examination. There is no nodularity. Rectal confirms.   Assessment/Plan:  79 year old diagnosed with advanced serous either primary peritoneal or ovarian carcinoma in December of 2012. Based on CT findings she was treated with neoadjuvant chemotherapy with carboplatin and paclitaxel from 01/27/2011 through March 10 2011. Her CA125 responded nicely as did her imaging. She had an optimal cytoreductive surgery by Dr. Stanford Breed. Final pathology revealed no evidence of disease. In an additional 3 cycles of chemotherapy after her debulking surgery were given and she has had no evidence of disease since that time. Her clinical exam today is unremarkable. Her CA-125s are also negative. She has a reducible incisional hernia and we discussed this before and she now has an appointment to see Dr. Michaell Cowing on 11/03/12 for evaluation.   She'll follow up with Dr.  Darrold Span is scheduled in 2-3 months and we'll see her 2-3 months after that appointment.      Garland Hincapie A., MD 10/21/2012, 10:42 AM

## 2012-10-21 NOTE — Patient Instructions (Addendum)
Follow up with Dr. Darrold Span in 3 months and return to see GYN oncology in 6 months.  Please call in Nov. Or Dec. 2014 to schedule an appointment for March 2015 with Dr. Duard Brady.

## 2012-10-22 LAB — CA 125: CA 125: 5 U/mL (ref 0.0–30.2)

## 2012-10-25 ENCOUNTER — Telehealth: Payer: Self-pay | Admitting: Gynecologic Oncology

## 2012-10-25 NOTE — Telephone Encounter (Signed)
Patient notified of CA 125 results:5.  No concerns voiced.  Instructed to call for any needs.

## 2012-11-03 ENCOUNTER — Ambulatory Visit (INDEPENDENT_AMBULATORY_CARE_PROVIDER_SITE_OTHER): Payer: Medicare Other | Admitting: Surgery

## 2012-11-03 ENCOUNTER — Encounter (INDEPENDENT_AMBULATORY_CARE_PROVIDER_SITE_OTHER): Payer: Self-pay | Admitting: Surgery

## 2012-11-03 VITALS — BP 130/78 | HR 76 | Temp 97.2°F | Resp 14 | Ht 64.5 in | Wt 175.4 lb

## 2012-11-03 DIAGNOSIS — C569 Malignant neoplasm of unspecified ovary: Secondary | ICD-10-CM | POA: Diagnosis not present

## 2012-11-03 DIAGNOSIS — K432 Incisional hernia without obstruction or gangrene: Secondary | ICD-10-CM

## 2012-11-03 NOTE — Patient Instructions (Addendum)
See the Handout(s) we gave you.  If you begin to have pain or other symptoms, consider surgery.  Please call our office at [phone] if you wish to schedule surgery or if you have further questions / concerns.   Hernia A hernia occurs when an internal organ pushes out through a weak spot in the abdominal wall. Hernias most commonly occur in the groin and around the navel. Hernias often can be pushed back into place (reduced). Most hernias tend to get worse over time. Some abdominal hernias can get stuck in the opening (irreducible or incarcerated hernia) and cannot be reduced. An irreducible abdominal hernia which is tightly squeezed into the opening is at risk for impaired blood supply (strangulated hernia). A strangulated hernia is a medical emergency. Because of the risk for an irreducible or strangulated hernia, surgery may be recommended to repair a hernia. CAUSES   Heavy lifting.  Prolonged coughing.  Straining to have a bowel movement.  A cut (incision) made during an abdominal surgery. HOME CARE INSTRUCTIONS   Bed rest is not required. You may continue your normal activities.  Avoid lifting more than 30 pounds (4.5 kg) or straining.  Cough gently. If you are a smoker it is best to stop. Even the best hernia repair can break down with the continual strain of coughing. Even if you do not have your hernia repaired, a cough will continue to aggravate the problem.  Do not wear anything tight over your hernia. Do not try to keep it in with an outside bandage or truss. These can damage abdominal contents if they are trapped within the hernia sac.  Eat a normal diet.  Avoid constipation. Straining over long periods of time will increase hernia size and encourage breakdown of repairs. If you cannot do this with diet alone, stool softeners may be used. SEEK IMMEDIATE MEDICAL CARE IF:   You have a fever.  You develop increasing abdominal pain.  You feel nauseous or vomit.  Your  hernia is stuck outside the abdomen, looks discolored, feels hard, or is tender.  You have any changes in your bowel habits or in the hernia that are unusual for you.  You have increased pain or swelling around the hernia.  You cannot push the hernia back in place by applying gentle pressure while lying down. MAKE SURE YOU:   Understand these instructions.  Will watch your condition.  Will get help right away if you are not doing well or get worse. Document Released: 01/27/2005 Document Revised: 04/21/2011 Document Reviewed: 09/16/2007 [facility] Patient Information 2014 [place], [place].  Exercise to Stay Healthy Exercise helps you become and stay healthy. EXERCISE IDEAS AND TIPS Choose exercises that:  You enjoy.  Fit into your day. You do not need to exercise really hard to be healthy. You can do exercises at a slow or medium level and stay healthy. You can:  Stretch before and after working out.  Try yoga, Pilates, or tai chi.  Lift weights.  Walk fast, swim, jog, run, climb stairs, bicycle, dance, or rollerskate.  Take aerobic classes. Exercises that burn about 150 calories:  Running 1  miles in 15 minutes.  Playing volleyball for 45 to 60 minutes.  Washing and waxing a car for 45 to 60 minutes.  Playing touch football for 45 minutes.  Walking 1  miles in 35 minutes.  Pushing a stroller 1  miles in 30 minutes.  Playing basketball for 30 minutes.  Raking leaves for 30 minutes.  Bicycling  5 miles in 30 minutes.  Walking 2 miles in 30 minutes.  Dancing for 30 minutes.  Shoveling snow for 15 minutes.  Swimming laps for 20 minutes.  Walking up stairs for 15 minutes.  Bicycling 4 miles in 15 minutes.  Gardening for 30 to 45 minutes.  Jumping rope for 15 minutes.  Washing windows or floors for 45 to 60 minutes. Document Released: 03/01/2010 Document Revised: 04/21/2011 Document Reviewed: 03/01/2010 [facility] Patient Information 2014 [place],  [place].

## 2012-11-03 NOTE — Progress Notes (Signed)
Subjective:     Patient ID: Rainey Pines, female   DOB: 10/25/1929, 79 y.o.   MRN: [id number]  HPI  JOHAN ANTONACCI  1929-08-12 [id number]  Patient Care Team: Catha Gosselin, MD as PCP - General (Family Medicine) Lenoard Aden, MD as Consulting Physician (Obstetrics and Gynecology) Jeannette Corpus, MD as Consulting Physician (Gynecology) Bernita Buffy. Duard Brady, MD as Consulting Physician (Gynecologic Oncology)  This patient is a 79 y.o.female who presents today for surgical evaluation at the request of Drs Billy Coast & Duard Brady.   Reason for visit: Incisional hernia  Is an elderly woman.  Found to have ovarian cancer with omental caking.  Underwent neoadjuvant chemotherapy.  Underwent resection with partial omentectomy.  Good result.  She is over a year out from that.  Followed closely by [facility] gynecological oncology, Dr. Darrold Span, Dr. Billy Coast.  More recently, she noticed bulging in her midabdomen.  It concerned her.  She pointed out to her team.  There was a suspicion of an incisional hernia.  Story gets a little confusing.  She says that the gynecologists did not want her to get her hernia repaired.  Their notes encourage her to see a Careers adviser.  Regardless of who said what, she showed up to our office and now claims that she wants to get advice on this hernia.  She feels that the bulge has gotten a little larger.  It is reducible and soft.  No nausea or vomiting.  No abdominal pain.  Just mildly unsightly.  No rib discomfort.  She is having bowel movements every other day.  She lives at [facility].  She is rather independent in her care.  She can walk 30 minutes without problems.  She uses water aerobics.  She denies any fevers chills or sweats.  No episodes of severe abdominal pain.  She has more recently struggled with some vaginal yeast infections but no severe infections elsewhere.  No history of MRSA.  Patient Active Problem List   Diagnosis Date Noted  . Incisional hernia,  periumbilical, without obstruction or gangrene 11/03/2012  . Anemia associated with acute blood loss 04/03/2011  . Ovarian cancer 01/25/2011  . HYPERLIPIDEMIA 04/23/2009  . Uncontrolled hypertension as indication for native nephrectomy 04/23/2009  . GERD 04/23/2009  . OSTEOARTHRITIS 04/23/2009  . OSTEOPENIA 04/23/2009    Past Medical History  Diagnosis Date  . Ascites   . Cystitis   . GERD (gastroesophageal reflux disease)   . Hypertension   . Hyponatremia   . Hyperlipidemia   . Osteopenia   . Postmenopausal atrophic vaginitis   . Vulvitis   . Staph infection 2010    after knee replacement  . Urinary frequency   . Complication of anesthesia     Sodium drops per pt   . Pneumonia     hx of   . Anemia     hx of   . Blood transfusion     hx of 2011   . H/O hiatal hernia   . Arthritis     knees   . Ovarian cancer 01/25/2011  . DIARRHEA, ANTIBIOTIC ASSOCIATED 05/31/2009    Qualifier: Diagnosis of  By: Daiva Eves MD, Remi Haggard    . ARTHRITIS, SEPTIC 05/28/2009    Qualifier: Diagnosis of  By: Daiva Eves MD, Remi Haggard    . Anemia associated with acute blood loss 04/03/2011  . OSTEOMYELITIS, CHRONIC, LOWER LEG 04/23/2009    Qualifier: Diagnosis of  By: Ninetta Lights MD, Tinnie Gens    . OSTEOPOROSIS 04/23/2009  Qualifier: Diagnosis of  By: Ninetta Lights MD, Tinnie Gens    . PROSTHETIC JOINT COMPLICATION 05/28/2009    Qualifier: Diagnosis of  By: Daiva Eves MD, Remi Haggard    . Neutropenia with fever 05/18/2011    Past Surgical History  Procedure Laterality Date  . Tonsillectomy    . Appendectomy    . Joint replacement      R knee in 2008, 5 operations on L knee  . Other surgical history      hx of C section 1966  . Laparotomy  04/01/2011    Procedure: EXPLORATORY LAPAROTOMY;  Surgeon: Jeannette Corpus, MD;  Location: [facility];  Service: Gynecology;  Laterality: N/A;  . Abdominal hysterectomy  04/01/2011    Procedure: HYSTERECTOMY ABDOMINAL;  Surgeon: Jeannette Corpus, MD;  Location: [facility];   Service: Gynecology;  Laterality: N/A;  . Salpingoophorectomy  04/01/2011    Procedure: SALPINGO OOPHERECTOMY;  Surgeon: Jeannette Corpus, MD;  Location: [facility];  Service: Gynecology;  Laterality: Bilateral;    History   Social History  . Marital Status: Widowed    Spouse Name: N/A    Number of Children: N/A  . Years of Education: N/A   Occupational History  . Not on file.   Social History Main Topics  . Smoking status: Former Smoker    Quit date: 02/11/1952  . Smokeless tobacco: Never Used  . Alcohol Use: No  . Drug Use: No  . Sexual Activity: No   Other Topics Concern  . Not on file   Social History Narrative  . No narrative on file    Family History  Problem Relation Age of Onset  . Heart attack Brother   . Diabetes Brother   . Cancer Other     Bladder cancer    Current Outpatient Prescriptions  Medication Sig Dispense Refill  . azelastine (ASTELIN) 137 MCG/SPRAY nasal spray Place 1 spray into the nose as needed. Use in each nostril as directed      . Calcium Citrate-Vitamin D (CITRACAL + D PO) Take 2 tablets by mouth daily.      . Cholecalciferol (VITAMIN D) 2000 UNITS tablet Take 2,000 Units by mouth daily.       Marland Kitchen ibuprofen (ADVIL,MOTRIN) 200 MG tablet Take 200 mg by mouth every 6 (six) hours as needed. pain      . lisinopril (PRINIVIL,ZESTRIL) 10 MG tablet Take 10 mg by mouth daily after breakfast.       . Multiple Vitamins-Minerals (CENTRUM SILVER PO) Take 1 tablet by mouth daily.       . nadolol (CORGARD) 20 MG tablet Take 10 mg by mouth daily after breakfast.       . nystatin cream (MYCOSTATIN) Apply 1 application topically 3 (three) times daily as needed. Applies to Outside of vagina and anus for Itching  30 g  1  . nystatin-triamcinolone ointment (MYCOLOG) Apply topically 2 (two) times daily as needed.  30 g  1  . omega-3 acid ethyl esters (LOVAZA) 1 G capsule Take 1 g by mouth daily.      Marland Kitchen omeprazole (PRILOSEC) 20 MG capsule Take 20 mg by mouth  Daily.      Marland Kitchen PATADAY 0.2 % SOLN       . Polyethyl Glycol-Propyl Glycol (SYSTANE) 0.4-0.3 % SOLN Place 1 drop into both eyes 4 (four) times daily.      . Ranibizumab (LUCENTIS IO) Place into the left eye once as needed. Pt receive from dr. Luciana Axe office [phone]      .  Red Yeast Rice Extract (RED YEAST RICE PO) Take 1 capsule by mouth 2 (two) times daily.      . vitamin E (VITAMIN E) 400 UNIT capsule Take 400 Units by mouth daily.      Marland Kitchen zolpidem (AMBIEN) 10 MG tablet Take 10 mg by mouth at bedtime.       . [DISCONTINUED] enoxaparin (LOVENOX) 40 MG/0.4ML SOLN Inject 0.4 mLs (40 mg total) into the skin daily.  11.2 mL  0   No current facility-administered medications for this visit.     Allergies  Allergen Reactions  . Morphine And Related     Given after knee replacement and code blue occurred  . Penicillins     REACTION: hives  . Shellfish Allergy     Pt has shellfish allergy only.  Has had IV contrast x 2 and did fine.    BP 130/78  Pulse 76  Temp(Src) 97.2 F (36.2 C)  Resp 14  Ht 5' 4.5" (1.638 m)  Wt 175 lb 6.4 oz (79.561 kg)  BMI 29.65 kg/m2  No results found.   Review of Systems  Constitutional: Negative for fever, chills, diaphoresis, appetite change and fatigue.  HENT: Positive for hearing loss. Negative for ear pain, sore throat, trouble swallowing, neck pain and ear discharge.   Eyes: Negative for photophobia, discharge and visual disturbance.  Respiratory: Negative for cough, choking, chest tightness and shortness of breath.   Cardiovascular: Negative for chest pain, palpitations and leg swelling.  Gastrointestinal: Negative for nausea, vomiting, abdominal pain, diarrhea, constipation, blood in stool, abdominal distention, anal bleeding and rectal pain.  Endocrine: Negative for cold intolerance and heat intolerance.  Genitourinary: Negative for dysuria, frequency and difficulty urinating.  Musculoskeletal: Negative for myalgias and gait problem.  Skin:  Negative for color change, pallor and rash.  Allergic/Immunologic: Negative for environmental allergies, food allergies and immunocompromised state.  Neurological: Negative for dizziness, speech difficulty, weakness and numbness.  Hematological: Negative for adenopathy.  Psychiatric/Behavioral: Negative for confusion and agitation. The patient is not nervous/anxious.        Objective:   Physical Exam  Constitutional: She is oriented to person, place, and time. She appears well-developed and well-nourished. No distress.  HENT:  Head: Normocephalic.  Right Ear: No decreased hearing is noted.  Left Ear: No decreased hearing is noted.  Mouth/Throat: Oropharynx is clear and moist. No oropharyngeal exudate.  Eyes: Conjunctivae and EOM are normal. Pupils are equal, round, and reactive to light. No scleral icterus.  Neck: Normal range of motion. Neck supple. No tracheal deviation present.  Cardiovascular: Normal rate, regular rhythm and intact distal pulses.   Pulmonary/Chest: Effort normal and breath sounds normal. No stridor. No respiratory distress. She exhibits no tenderness.  Abdominal: Soft. She exhibits no distension and no mass. There is no tenderness. A hernia is present. Hernia confirmed positive in the ventral area. Hernia confirmed negative in the right inguinal area and confirmed negative in the left inguinal area.    Genitourinary: No vaginal discharge found.  Musculoskeletal: Normal range of motion. She exhibits no tenderness.       Right elbow: She exhibits normal range of motion.       Left elbow: She exhibits normal range of motion.       Right wrist: She exhibits normal range of motion.       Left wrist: She exhibits normal range of motion.       Right hand: Normal strength noted.       Left  hand: Normal strength noted.  Lymphadenopathy:       Head (right side): No posterior auricular adenopathy present.       Head (left side): No posterior auricular adenopathy present.     She has no cervical adenopathy.    She has no axillary adenopathy.       Right: No inguinal adenopathy present.       Left: No inguinal adenopathy present.  Neurological: She is alert and oriented to person, place, and time. No cranial nerve deficit. She exhibits normal muscle tone. Coordination normal.  Skin: Skin is warm and dry. No rash noted. She is not diaphoretic. No erythema.  Psychiatric: She has a normal mood and affect. Her behavior is normal. Judgment and thought content normal.       Assessment:     Periumbilical incisional hernia.  Defect about 3 cm in size.  SQ sac/mass almost the size of a grapefruit.  Reducible.  Survivor of chemotherapy and surgical resection for ovarian cancer.  Disease-free in the past 12-18 months.     Plan:     I noted there are options.  Standard of care is to repair this hernia since it is of moderate size and there is a risk of incarceration/strangulation.  She is 79 years old, she is quite functional independent and could have several more years for this to get her into further problems.  However, she did have peritoneal disease on initial presentation of her ovarian cancer.  She may have moderate adhesions in attempting a laparoscopic repair.  I may favor an open repair.  I am skeptical that stitches only would be enough.  I think mesh repair be helpful since this is a moderate defect in her advanced age/per chemotherapy makes her tissues repair with stitches alone.  She had concerns questions about mesh.  I gave her my take on things.  It lowers the chance of hernia recurrence by 50%.  It is not near any rash or abdominal organs, so I am hopeful that the risks of erosion or other issues are extremely low.  I think it is reasonable to try diagnostic laparoscopy and if the lesions are too dense then consider and open/anterior underlay approach.  I worry that if we wait until it is an emergency, morbidity mortality risks are much higher.  Is not a very  high risk operation aside from bowel injury.  Certainly, she tolerated the major gynecologic resection last year relatively slowly.  Nonetheless, it would be nice to get a sense of what her long-term survival is with the ovarian cancer before putting her through another operation.  I did discuss what hernia repair would entail:  The anatomy & physiology of the abdominal wall was discussed.  The pathophysiology of hernias was discussed.  Natural history risks without surgery including progeressive enlargement, pain, incarceration & strangulation was discussed.   Contributors to complications such as smoking, obesity, diabetes, prior surgery, etc were discussed.   I feel the risks of no intervention will lead to serious problems that outweigh the operative risks; therefore, I recommended surgery to reduce and repair the hernia.  I explained laparoscopic techniques with possible need for an open approach.  I noted the probable use of mesh to patch and/or buttress the hernia repair  Risks such as bleeding, infection, abscess, need for further treatment, heart attack, death, and other risks were discussed.  I noted a good likelihood this will help address the problem.   Goals of post-operative recovery were  discussed as well.  Possibility that this will not correct all symptoms was explained.  I stressed the importance of low-impact activity, aggressive pain control, avoiding constipation, & not pushing through pain to minimize risk of post-operative chronic pain or injury. Possibility of reherniation especially with smoking, obesity, diabetes, immunosuppression, and other health conditions was discussed.  We will work to minimize complications.     Because it is not giving her severe symptoms, I think she has options.  This really comes down to his the risk of observation vs. the risk of surgery.  At this point, she did not want to proceed with surgery.  I think that is reasonable as long as she is aware of  signs/symptoms of incarceration/strangulation.  An educational handout further explaining the pathology & treatment options was given as well.  Questions were answered.  The patient expresses understanding.

## 2012-11-13 DIAGNOSIS — Z23 Encounter for immunization: Secondary | ICD-10-CM | POA: Diagnosis not present

## 2012-12-16 ENCOUNTER — Other Ambulatory Visit: Payer: Self-pay

## 2013-01-10 DIAGNOSIS — H3560 Retinal hemorrhage, unspecified eye: Secondary | ICD-10-CM | POA: Diagnosis not present

## 2013-01-10 DIAGNOSIS — H35359 Cystoid macular degeneration, unspecified eye: Secondary | ICD-10-CM | POA: Diagnosis not present

## 2013-01-10 DIAGNOSIS — H348392 Tributary (branch) retinal vein occlusion, unspecified eye, stable: Secondary | ICD-10-CM | POA: Diagnosis not present

## 2013-01-10 DIAGNOSIS — H35049 Retinal micro-aneurysms, unspecified, unspecified eye: Secondary | ICD-10-CM | POA: Diagnosis not present

## 2013-01-10 DIAGNOSIS — H35039 Hypertensive retinopathy, unspecified eye: Secondary | ICD-10-CM | POA: Diagnosis not present

## 2013-01-11 DIAGNOSIS — Z1231 Encounter for screening mammogram for malignant neoplasm of breast: Secondary | ICD-10-CM | POA: Diagnosis not present

## 2013-01-16 ENCOUNTER — Other Ambulatory Visit: Payer: Self-pay | Admitting: Oncology

## 2013-01-16 DIAGNOSIS — C569 Malignant neoplasm of unspecified ovary: Secondary | ICD-10-CM

## 2013-01-17 ENCOUNTER — Ambulatory Visit (HOSPITAL_BASED_OUTPATIENT_CLINIC_OR_DEPARTMENT_OTHER): Payer: Medicare Other | Admitting: Lab

## 2013-01-17 ENCOUNTER — Ambulatory Visit (HOSPITAL_BASED_OUTPATIENT_CLINIC_OR_DEPARTMENT_OTHER): Payer: Medicare Other | Admitting: Oncology

## 2013-01-17 ENCOUNTER — Telehealth: Payer: Self-pay | Admitting: *Deleted

## 2013-01-17 ENCOUNTER — Encounter: Payer: Self-pay | Admitting: Oncology

## 2013-01-17 VITALS — BP 130/71 | HR 76 | Temp 98.2°F | Resp 18 | Ht 64.0 in | Wt 182.4 lb

## 2013-01-17 DIAGNOSIS — C801 Malignant (primary) neoplasm, unspecified: Secondary | ICD-10-CM | POA: Diagnosis not present

## 2013-01-17 DIAGNOSIS — C569 Malignant neoplasm of unspecified ovary: Secondary | ICD-10-CM

## 2013-01-17 LAB — CBC WITH DIFFERENTIAL/PLATELET
BASO%: 1.1 % (ref 0.0–2.0)
Basophils Absolute: 0.1 10*3/uL (ref 0.0–0.1)
EOS%: 4.4 % (ref 0.0–7.0)
Eosinophils Absolute: 0.3 10*3/uL (ref 0.0–0.5)
HCT: 36 % (ref 34.8–46.6)
HGB: 12.3 g/dL (ref 11.6–15.9)
LYMPH%: 19.3 % (ref 14.0–49.7)
MCH: 29.7 pg (ref 25.1–34.0)
MCHC: 34.1 g/dL (ref 31.5–36.0)
MCV: 87.1 fL (ref 79.5–101.0)
MONO#: 0.6 10*3/uL (ref 0.1–0.9)
MONO%: 10.4 % (ref 0.0–14.0)
NEUT#: 3.9 10*3/uL (ref 1.5–6.5)
NEUT%: 64.8 % (ref 38.4–76.8)
Platelets: 198 10*3/uL (ref 145–400)
RBC: 4.13 10*6/uL (ref 3.70–5.45)
RDW: 13.9 % (ref 11.2–14.5)
WBC: 5.9 10*3/uL (ref 3.9–10.3)
lymph#: 1.1 10*3/uL (ref 0.9–3.3)

## 2013-01-17 LAB — COMPREHENSIVE METABOLIC PANEL (CC13)
ALT: 13 U/L (ref 0–55)
AST: 20 U/L (ref 5–34)
Albumin: 3.7 g/dL (ref 3.5–5.0)
Alkaline Phosphatase: 57 U/L (ref 40–150)
Anion Gap: 8 mEq/L (ref 3–11)
BUN: 29.9 mg/dL — ABNORMAL HIGH (ref 7.0–26.0)
CO2: 27 mEq/L (ref 22–29)
Calcium: 10.4 mg/dL (ref 8.4–10.4)
Chloride: 102 mEq/L (ref 98–109)
Creatinine: 1.1 mg/dL (ref 0.6–1.1)
Glucose: 99 mg/dl (ref 70–140)
Potassium: 4.7 mEq/L (ref 3.5–5.1)
Sodium: 137 mEq/L (ref 136–145)
Total Bilirubin: 0.36 mg/dL (ref 0.20–1.20)
Total Protein: 7.5 g/dL (ref 6.4–8.3)

## 2013-01-17 NOTE — Telephone Encounter (Signed)
appts made and printed...td 

## 2013-01-17 NOTE — Progress Notes (Signed)
OFFICE PROGRESS NOTE   01/17/2013   Physicians:P.Gehrig, R.Taavon, K.Little, J.Hayes, G.Rankin   INTERVAL HISTORY:  Patient is seen, alone for visit, in continuing attention to her history of gyn carcinoma, this IIIC primary peritoneal vs ovarian, for which she is on observation since completing chemotherapy 07-01-11. She saw Dr Duard Brady last on 10-21-2012 and will see her again in ~3 months from now. Last scans were CT AP 02-2011. Last CA 125 was 5.0 on 10-21-2012; available after visit today 6.1 She does not have PAC.  Patient was seen in consultation by Dr.Gross in Sept 2014 for periumbilical incisional hernia, with defect ~ 3 cm and reducible hernia "about size of grapefruit". Dr Michaell Cowing discussed risks of observation vs risks of surgery, and patient has preferred no elective surgery now. She wears suppport garment and is careful with activity, with no symptoms still. She is otherwise physically feeling very well, with no abdominal or pelvic discomfort or distension otherwise, good appetite and energy, no LE swelling, no bleeding and bowels moving regularly.   Patient's husband died suddenly in 10-15-22, from intracranial bleed after a fall. This has been difficult for her, tho she is grateful that they had moved into the assisted living facility prior to his passing, and she is staying active with many programs available there.  She is up to date with visits to Dr Billy Coast (yearly) and PCP. Mammograms are at [facility], last   ONCOLOGIC HISTORY Patient presented with several weeks of bloating and diarrhea in fall 2012, found to have ascites and carcinomatosis. Pathology was documented from the malignant ascites 01-16-2011, consistent with serous gyn primary. She required two large volume paracenteses in Dec. She received 3 cycles of neoadjuvant carboplatin/ taxol from 01-27-2011 thru 03-10-11, with gCSF support required. CA 125 was 239 in Dec 2012 and down to 4.9 by early February 2013. CT AP Jan 2013 showed  resolution of ascites and significant improvement in omental disease. She went to TAH/BSO/omentectomy by Dr.ClarkePearson on 04-01-2011, with extensive adhesions noted and oozing of ascites fluid from all peritoneal surfaces during that surgery; pathology had no residual cancer apparent. She resumed taxol/carboplatin for an additional 3 cycles from 05-12-11 thru 07-01-2011 and has been on observation since then. Last CA 125 was 5.0 on 10-21-12 and resulted after visit today at 6.1.   Review of systems as above, also: No recent infectious illness. No SOB or other respiratory symptoms. No N/V. No LE swelling. She is eating more than prior to moving to the assisted living, trying to get healthy choices. Remainder of 10 point Review of Systems negative.  Objective:  Vital signs in last 24 hours:  BP 130/71  Pulse 76  Temp(Src) 98.2 F (36.8 C) (Oral)  Resp 18  Ht 5\' 4"  (1.626 m)  Wt 182 lb 6.4 oz (82.736 kg)  BMI 31.29 kg/m2 Weight is up 7.5 lbs. Alert, oriented and appropriate. Ambulatory without difficulty.  Very neatly groomed as always, a little tearful when telling me about husband, but affect otherwise ok.  HEENT:PERRL, sclerae not icteric. Oral mucosa moist without lesions, posterior pharynx clear. No alopecia. Neck supple. No JVD.  Lymphatics:no cervical,suraclavicular, axillary or inguinal adenopathy Resp: clear to auscultation bilaterally and normal percussion bilaterally Cardio: regular rate and rhythm. No gallop. GI: obese, soft, nontender, not distended, no appreciable mass or organomegaly. Normally active bowel sounds. Surgical incision closed. Ventral hernia nontender and reduces. She is wearing support garment. Musculoskeletal/ Extremities: without pitting edema, cords, tenderness. Back not tender. Neuro: no peripheral neuropathy. Otherwise  nonfocal. Psych as above. Skin without rash, ecchymosis, petechiae  Lab Results available after visit:    WBC 5.9, ANC 3.9, Hgb 12.3,  plt 198k, MCV 87 and differential otherwise not remarkable CMET entirely normal with exception of BUN 29.9 CA 125    6.1  We will let patient know lab information by phone.  Studies/Results: Mammograms done at [facility] 01-11-13, that report requested now   Medications: I have reviewed the patient's current medications. She has had flu vaccine and shingles vaccine this fall   Note from Dr Michaell Cowing reviewed.  Assessment/Plan: 1.Serous gyn carcinoma: history as above, clinically continues to do well. She will see Dr Duard Brady in 3 months with ca125 and will see me again in 6 months with cbc cmet ca125. 2.ventral hernia: She has had consultation re surgical intervention with Dr Michaell Cowing, and prefers no elective surgery at this time. 3.hx HTN, GERD, and SIADH with prior surgeries 4.flu and shingles vaccines done 5.recent death of husband, which she seems to be handling appropriately. 6.up to date on mammograms, report from 01-11-13 at [facility] requested.   Patient is comfortable with discussion and in agreement with plan.  Cc this note and labs to PCP Time spent 25 min. including >50% discussion and coordination of care  Pilar Westergaard P, MD   01/17/2013, 10:24 AM

## 2013-01-17 NOTE — Patient Instructions (Signed)
Call if questions or concerns before next scheduled visit.  We will let you know results of blood work from today.

## 2013-01-18 ENCOUNTER — Telehealth: Payer: Self-pay | Admitting: *Deleted

## 2013-01-18 LAB — CA 125: CA 125: 6.1 U/mL (ref 0.0–30.2)

## 2013-01-18 NOTE — Telephone Encounter (Signed)
Left VM for patient to call regarding labs-non urgent.

## 2013-01-18 NOTE — Telephone Encounter (Signed)
Patient notified of results and to follow up as scheduled. Copy also sent to her PCP by Dr. Darrold Span.

## 2013-01-19 ENCOUNTER — Telehealth: Payer: Self-pay | Admitting: Oncology

## 2013-01-19 NOTE — Telephone Encounter (Signed)
returned pt call and lvm confirming March adn June 2015 appts.

## 2013-03-08 DIAGNOSIS — E782 Mixed hyperlipidemia: Secondary | ICD-10-CM | POA: Diagnosis not present

## 2013-03-08 DIAGNOSIS — R350 Frequency of micturition: Secondary | ICD-10-CM | POA: Diagnosis not present

## 2013-04-14 ENCOUNTER — Other Ambulatory Visit: Payer: Medicare Other

## 2013-04-14 ENCOUNTER — Ambulatory Visit: Payer: Medicare Other | Attending: Gynecologic Oncology | Admitting: Gynecologic Oncology

## 2013-04-14 ENCOUNTER — Encounter: Payer: Self-pay | Admitting: Gynecologic Oncology

## 2013-04-14 VITALS — BP 146/59 | HR 63 | Temp 99.0°F | Resp 18 | Ht 63.98 in | Wt 187.1 lb

## 2013-04-14 DIAGNOSIS — K219 Gastro-esophageal reflux disease without esophagitis: Secondary | ICD-10-CM | POA: Insufficient documentation

## 2013-04-14 DIAGNOSIS — Z791 Long term (current) use of non-steroidal anti-inflammatories (NSAID): Secondary | ICD-10-CM | POA: Diagnosis not present

## 2013-04-14 DIAGNOSIS — I10 Essential (primary) hypertension: Secondary | ICD-10-CM | POA: Diagnosis not present

## 2013-04-14 DIAGNOSIS — Z90710 Acquired absence of both cervix and uterus: Secondary | ICD-10-CM | POA: Insufficient documentation

## 2013-04-14 DIAGNOSIS — Z79899 Other long term (current) drug therapy: Secondary | ICD-10-CM | POA: Insufficient documentation

## 2013-04-14 DIAGNOSIS — C569 Malignant neoplasm of unspecified ovary: Secondary | ICD-10-CM | POA: Insufficient documentation

## 2013-04-14 DIAGNOSIS — E785 Hyperlipidemia, unspecified: Secondary | ICD-10-CM | POA: Diagnosis not present

## 2013-04-14 DIAGNOSIS — Z87891 Personal history of nicotine dependence: Secondary | ICD-10-CM | POA: Diagnosis not present

## 2013-04-14 LAB — CA 125: CA 125: 3.9 U/mL (ref 0.0–30.2)

## 2013-04-14 NOTE — Patient Instructions (Signed)
Followup Dr. Marko Plume in 2-3 months and return to see me 2-3 months after that appointment.

## 2013-04-14 NOTE — Progress Notes (Signed)
Consult Note: Gyn-Onc  Carlton Adam 80 y.o. female  CC:  Chief Complaint  Patient presents with  . 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 Her illness began with several weeks of diarrhea and abdominal bloating the fall of 2012. She was tested several times for C. difficile was negative. Dr. Ronita Hipps at that time recommended an ultrasound and ultimately a CT scan. Ultrasound showed significant ascites and her CA 125 was markedly elevated which is when we saw her. CT imaging was most consistent with markedly advanced disease that would most likely preclude optimal debulking. This in conjunction with her poor performance status the decision was made to proceed with neoadjuvant chemotherapy. She had a high-volume paracentesis to confirm the diagnosis of a gynecologic malignancy.   She had 3 cycles of neoadjuvant chemotherapy under the care of Dr. Marko Plume with paclitaxel and carboplatin. Her CA 125 prior cycle #1 was 204 and on February 28, 2011 prior to cycle #2 her CA 125 was 12.6. She also had a CT scan that showed marked resolution of her ascites and marked improvement in the omental caking. There is no mention of any significant carcinomatosis. There is no significant adenopathy. The adnexa and uterus are within normal limits.  After discussion of pros and cons she opted for definitive surgical management. On 04/01/2011 she underwent surgery by Dr. Fermin Schwab. She underwent exploratory laparotomy TAH/BSO and omental resection. There was no gross evidence of disease. Pathology revealed no residual carcinoma within the omentum. There was stromal fibrosis and chronic inflammation and scattered psammoma bodies consistent with posttreatment effect. Within the right ovary and tube was no residual carcinoma. There was benign ovarian tissue with associated serosal adhesions necrosis and scattered psammoma bodies. This was seen  on the uterine serosa. The uterus was otherwise negative. The left tube and ovary also had associated psammoma bodies but no residual carcinoma identified. She did well postoperatively and after lengthy discussions underwent an additional 3 cycles of chemotherapy which she completed in 06-20-11. CA-125 at that time was 4.2 and on 07/23/11 it was 3.3. Subsequent CA-125's have been less than 5-6 with her last one as 6.1 on 01/17/13 and labs pending from today.  The most notable history is that in August 2014 her husband fell while playing ping-pong and hit his head. He had a large intracerebral bleed and it is unclear whether he had a stroke, an aneurysm rupture or related to trauma. They decided to withdraw. They have been married normal 60 years. Her husband was about to turn 23. This is been very stressful for her.  Interval History: She is overall doing very well. She is living at [facility]. She's gained approximately 14 pounds since we last saw her. She attributes this to the being closer she's not able to do her water aerobics. In addition, they have a lot of sweet (creams available at all times and she has been enjoying those. A few days ago she fell as her foot got caught in a carpet has ecchymosis of her right knee. There is no pain associated with it. At this point she's not sure of the hernia is any bigger versus distant abdomen getting bigger from her weight gain. It is not bothering her. She's doing very social with her friends. She is going to lunch in today. Her spirits are good and she's feeling "very well"    Review of Systems:  Constitutional: Denies fever.No weight loss. Skin: No rash, sores, jaundice, itching, or dryness.  Cardiovascular: No chest pain, shortness of breath, or edema  Pulmonary: No cough or wheeze.  Gastro Intestinal:No nausea, vomiting, constipation, or diarrhea reported. No bright red blood per rectum or change in bowel movement. + hernia Genitourinary: No  frequency, urgency, or dysuria.  Denies vaginal bleeding and discharge.  Musculoskeletal: No myalgia, arthralgia, joint swelling or pain. + bruising Neurologic: No weakness, numbness, or change in gait.  Psychology: Tearful  Current Meds:  Outpatient Encounter Prescriptions as of 04/14/2013  Medication Sig  . azelastine (ASTELIN) 137 MCG/SPRAY nasal spray Place 1 spray into the nose as needed. Use in each nostril as directed  . Calcium Citrate-Vitamin D (CITRACAL + D PO) Take 2 tablets by mouth daily.  . Cholecalciferol (VITAMIN D) 2000 UNITS tablet Take 2,000 Units by mouth daily.   Marland Kitchen ibuprofen (ADVIL,MOTRIN) 200 MG tablet Take 200 mg by mouth every 6 (six) hours as needed. pain  . lisinopril (PRINIVIL,ZESTRIL) 10 MG tablet Take 10 mg by mouth daily after breakfast.   . Multiple Vitamins-Minerals (CENTRUM SILVER PO) Take 1 tablet by mouth daily.   . nadolol (CORGARD) 20 MG tablet Take 10 mg by mouth daily after breakfast.   . omega-3 acid ethyl esters (LOVAZA) 1 G capsule Take 1 g by mouth daily.  Marland Kitchen omeprazole (PRILOSEC) 20 MG capsule Take 20 mg by mouth Daily.  Marland Kitchen PATADAY 0.2 % SOLN   . Polyethyl Glycol-Propyl Glycol (SYSTANE) 0.4-0.3 % SOLN Place 1 drop into both eyes 4 (four) times daily.  . Red Yeast Rice Extract (RED YEAST RICE PO) Take 1 capsule by mouth 2 (two) times daily.  . vitamin E (VITAMIN E) 400 UNIT capsule Take 400 Units by mouth daily.  Marland Kitchen zolpidem (AMBIEN) 10 MG tablet Take 10 mg by mouth at bedtime.   Marland Kitchen nystatin cream (MYCOSTATIN) Apply 1 application topically 3 (three) times daily as needed. Applies to Outside of vagina and anus for Itching  . nystatin-triamcinolone ointment (MYCOLOG) Apply topically 2 (two) times daily as needed.  . Ranibizumab (LUCENTIS IO) Place into the left eye once as needed. Pt receive from dr. Zadie Rhine office [phone]    Allergy:  Allergies  Allergen Reactions  . Morphine And Related     Given after knee replacement and code blue occurred   . Penicillins     REACTION: hives  . Shellfish Allergy     Pt has shellfish allergy only.  Has had IV contrast x 2 and did fine.    Social Hx:   History   Social History  . Marital Status: Widowed    Spouse Name: N/A    Number of Children: N/A  . Years of Education: N/A   Occupational History  . Not on file.   Social History Main Topics  . Smoking status: Former Smoker    Quit date: 02/11/1952  . Smokeless tobacco: Never Used  . Alcohol Use: No  . Drug Use: No  . Sexual Activity: No   Other Topics Concern  . Not on file   Social History Narrative  . No narrative on file    Past Surgical Hx:  Past Surgical History  Procedure Laterality Date  . Tonsillectomy    . Appendectomy    . Joint replacement      R knee in 2008, 5 operations on L knee  . Other surgical history      hx of C section 1966  .  Laparotomy  04/01/2011    Procedure: EXPLORATORY LAPAROTOMY;  Surgeon: Alvino Chapel, MD;  Location: [facility];  Service: Gynecology;  Laterality: N/A;  . Abdominal hysterectomy  04/01/2011    Procedure: HYSTERECTOMY ABDOMINAL;  Surgeon: Alvino Chapel, MD;  Location: [facility];  Service: Gynecology;  Laterality: N/A;  . Salpingoophorectomy  04/01/2011    Procedure: SALPINGO OOPHERECTOMY;  Surgeon: Alvino Chapel, MD;  Location: [facility];  Service: Gynecology;  Laterality: Bilateral;    Past Medical Hx:  Past Medical History  Diagnosis Date  . Ascites   . Cystitis   . GERD (gastroesophageal reflux disease)   . Hypertension   . Hyponatremia   . Hyperlipidemia   . Osteopenia   . Postmenopausal atrophic vaginitis   . Vulvitis   . Staph infection 2010    after knee replacement  . Urinary frequency   . Complication of anesthesia     Sodium drops per pt   . Pneumonia     hx of   . Anemia     hx of   . Blood transfusion     hx of 2011   . H/O hiatal hernia   . Arthritis     knees   . Ovarian cancer 01/25/2011  . DIARRHEA, ANTIBIOTIC  ASSOCIATED 05/31/2009    Qualifier: Diagnosis of  By: Tommy Medal MD, Roderic Scarce    . ARTHRITIS, SEPTIC 05/28/2009    Qualifier: Diagnosis of  By: Tommy Medal MD, Roderic Scarce    . Anemia associated with acute blood loss 04/03/2011  . OSTEOMYELITIS, CHRONIC, LOWER LEG 04/23/2009    Qualifier: Diagnosis of  By: Johnnye Sima MD, Dellis Filbert    . OSTEOPOROSIS 04/23/2009    Qualifier: Diagnosis of  By: Johnnye Sima MD, Dellis Filbert    . PROSTHETIC JOINT COMPLICATION [id number]    Qualifier: Diagnosis of  By: Tommy Medal MD, Roderic Scarce    . Neutropenia with fever 05/18/2011    Oncology Hx:    Ovarian cancer   01/25/2011 Initial Diagnosis Ovarian cancer   01/27/2011 - 06/23/2011 Chemotherapy 6 cycles paclitaxel and carboplatin   04/01/2011 Surgery optimal debulking post three cycles of neoadjuvant chemotherapy followed by three cycles of chemotherapy for a total of 6    Family Hx:  Family History  Problem Relation Age of Onset  . Heart attack Brother   . Diabetes Brother   . Cancer Other     Bladder cancer    Vitals:  Blood pressure 146/59, pulse 63, temperature 99 F (37.2 C), temperature source Oral, resp. rate 18, height 5' 3.98" (1.625 m), weight 187 lb 1.6 oz (84.868 kg).  Physical Exam:  Well-nourished well-developed female in no acute distress.   Neck: Supple, no lymphadenopathy, no thyromegaly.   Lungs: Clear to auscultation bilaterally.   Cardiovascular: Regular rate and rhythm.   Abdomen: Well-healed vertical incision. She has a reducible, non tender incisional hernia towards the left side of her incision and a palpable 7 cm opening in the fascia is appreciated. Easily reducible. Abdomen is soft, nontender, nondistended.   Groins: No lymphadenopathy.   Extremities: No edema.   Pelvic: Atrophic external genitalia. There were no palpable masses on bimanual examination. There is no nodularity. Rectal confirms.   Assessment/Plan:  80 year old diagnosed with advanced serous either primary peritoneal or ovarian  carcinoma in December of 2012. Based on CT findings she was treated with neoadjuvant chemotherapy with carboplatin and paclitaxel from 01/27/2011 through March 10 2011. Her CA125 responded nicely as did her imaging. She had an  optimal cytoreductive surgery by Dr. Fermin Schwab. Final pathology revealed no evidence of disease. In an additional 3 cycles of chemotherapy after her debulking surgery were given and she has had no evidence of disease since that time. Her clinical exam today is unremarkable. Her CA-125s are also negative. We'll call her with the results of her CA 125 from today. She will followup with Dr. Marko Plume in 2-3 months and return to see [place] to 3 months after that. She was encouraged to not stopping eatign the foods that she enjoys but to eat smaller portions. We also discussed having her participate in other activities where she lives including yoga and stretching exercises but may also distracted her from eating quite as much.   Diane Mochizuki A., MD 04/14/2013, 9:35 AM

## 2013-04-18 DIAGNOSIS — H35359 Cystoid macular degeneration, unspecified eye: Secondary | ICD-10-CM | POA: Diagnosis not present

## 2013-04-18 DIAGNOSIS — IMO0002 Reserved for concepts with insufficient information to code with codable children: Secondary | ICD-10-CM | POA: Diagnosis not present

## 2013-04-18 DIAGNOSIS — H3560 Retinal hemorrhage, unspecified eye: Secondary | ICD-10-CM | POA: Diagnosis not present

## 2013-04-18 DIAGNOSIS — M1710 Unilateral primary osteoarthritis, unspecified knee: Secondary | ICD-10-CM | POA: Diagnosis not present

## 2013-04-18 DIAGNOSIS — H348392 Tributary (branch) retinal vein occlusion, unspecified eye, stable: Secondary | ICD-10-CM | POA: Diagnosis not present

## 2013-04-25 DIAGNOSIS — H348392 Tributary (branch) retinal vein occlusion, unspecified eye, stable: Secondary | ICD-10-CM | POA: Diagnosis not present

## 2013-04-25 DIAGNOSIS — H3560 Retinal hemorrhage, unspecified eye: Secondary | ICD-10-CM | POA: Diagnosis not present

## 2013-04-25 DIAGNOSIS — H35359 Cystoid macular degeneration, unspecified eye: Secondary | ICD-10-CM | POA: Diagnosis not present

## 2013-06-20 DIAGNOSIS — R300 Dysuria: Secondary | ICD-10-CM | POA: Diagnosis not present

## 2013-06-20 DIAGNOSIS — N899 Noninflammatory disorder of vagina, unspecified: Secondary | ICD-10-CM | POA: Diagnosis not present

## 2013-06-20 DIAGNOSIS — H348392 Tributary (branch) retinal vein occlusion, unspecified eye, stable: Secondary | ICD-10-CM | POA: Diagnosis not present

## 2013-06-20 DIAGNOSIS — H3560 Retinal hemorrhage, unspecified eye: Secondary | ICD-10-CM | POA: Diagnosis not present

## 2013-06-20 DIAGNOSIS — R350 Frequency of micturition: Secondary | ICD-10-CM | POA: Diagnosis not present

## 2013-07-26 ENCOUNTER — Other Ambulatory Visit: Payer: Self-pay | Admitting: Oncology

## 2013-07-29 ENCOUNTER — Other Ambulatory Visit (HOSPITAL_BASED_OUTPATIENT_CLINIC_OR_DEPARTMENT_OTHER): Payer: Medicare Other

## 2013-07-29 ENCOUNTER — Ambulatory Visit (HOSPITAL_BASED_OUTPATIENT_CLINIC_OR_DEPARTMENT_OTHER): Payer: Medicare Other | Admitting: Oncology

## 2013-07-29 ENCOUNTER — Encounter: Payer: Self-pay | Admitting: Oncology

## 2013-07-29 VITALS — BP 150/83 | HR 80 | Temp 98.0°F | Resp 16 | Ht 63.0 in | Wt 186.6 lb

## 2013-07-29 DIAGNOSIS — C569 Malignant neoplasm of unspecified ovary: Secondary | ICD-10-CM

## 2013-07-29 LAB — COMPREHENSIVE METABOLIC PANEL (CC13)
ALT: 11 U/L (ref 0–55)
AST: 17 U/L (ref 5–34)
Albumin: 3.4 g/dL — ABNORMAL LOW (ref 3.5–5.0)
Alkaline Phosphatase: 53 U/L (ref 40–150)
Anion Gap: 9 mEq/L (ref 3–11)
BUN: 28.6 mg/dL — ABNORMAL HIGH (ref 7.0–26.0)
CO2: 27 mEq/L (ref 22–29)
Calcium: 9.7 mg/dL (ref 8.4–10.4)
Chloride: 105 mEq/L (ref 98–109)
Creatinine: 1.1 mg/dL (ref 0.6–1.1)
Glucose: 118 mg/dl (ref 70–140)
Potassium: 4.6 mEq/L (ref 3.5–5.1)
Sodium: 141 mEq/L (ref 136–145)
Total Bilirubin: 0.46 mg/dL (ref 0.20–1.20)
Total Protein: 6.9 g/dL (ref 6.4–8.3)

## 2013-07-29 LAB — CBC WITH DIFFERENTIAL/PLATELET
BASO%: 0.7 % (ref 0.0–2.0)
Basophils Absolute: 0 10*3/uL (ref 0.0–0.1)
EOS%: 4.2 % (ref 0.0–7.0)
Eosinophils Absolute: 0.2 10*3/uL (ref 0.0–0.5)
HCT: 36.8 % (ref 34.8–46.6)
HGB: 11.8 g/dL (ref 11.6–15.9)
LYMPH%: 16.7 % (ref 14.0–49.7)
MCH: 28.3 pg (ref 25.1–34.0)
MCHC: 32.2 g/dL (ref 31.5–36.0)
MCV: 88 fL (ref 79.5–101.0)
MONO#: 0.7 10*3/uL (ref 0.1–0.9)
MONO%: 13.1 % (ref 0.0–14.0)
NEUT#: 3.7 10*3/uL (ref 1.5–6.5)
NEUT%: 65.3 % (ref 38.4–76.8)
Platelets: 175 10*3/uL (ref 145–400)
RBC: 4.18 10*6/uL (ref 3.70–5.45)
RDW: 14.8 % — ABNORMAL HIGH (ref 11.2–14.5)
WBC: 5.6 10*3/uL (ref 3.9–10.3)
lymph#: 0.9 10*3/uL (ref 0.9–3.3)

## 2013-07-29 LAB — CA 125: CA 125: 5.9 U/mL (ref 0.0–30.2)

## 2013-07-29 NOTE — Progress Notes (Signed)
OFFICE PROGRESS NOTE   07/29/2013   Physicians:P.Gehrig, R.Taavon, K.Little, J.Hayes, G.Rankin, S.Gross   INTERVAL HISTORY:   Patient is seen, alone for visit, as she continues on observation for IIIC primary peritoneal carcinoma since completing chemotherapy 06-2011. She saw Dr Alycia Rossetti in 04-2013 and will continue alternating visits every 3 months between our clinics for now. Patient is doing well other than large ventral hernia which has been present following surgery for the gyn malignancy. She saw Dr Johney Maine in consultation fall 2014, with discussion of repair likely with mesh vs observation, possibility of adhesions and possible risks of surgery. She feels that the hernia is larger and dislikes how it looks, tho it is not actually uncomfortable. She may want to discuss again with surgery if marker returns in good range today.  ONCOLOGIC HISTORY   Ovarian cancer   01/25/2011 Initial Diagnosis Ovarian cancer   01/27/2011 - 06/23/2011 Chemotherapy 6 cycles paclitaxel and carboplatin   04/01/2011 Surgery optimal debulking post three cycles of neoadjuvant chemotherapy followed by three cycles of chemotherapy for a total of 6  Patient presented with several weeks of bloating and diarrhea in fall 2012, found to have ascites and carcinomatosis. Pathology was documented from the malignant ascites 01-16-2011, consistent with serous gyn primary. She required two large volume paracenteses in Dec. She received 3 cycles of neoadjuvant carboplatin/ taxol from 01-27-2011 thru 03-10-11, with gCSF support required. CA 125 was 239 in Dec 2012 and down to 4.9 by early February 2013. CT AP Jan 2013 showed resolution of ascites and significant improvement in omental disease. She went to TAH/BSO/omentectomy by Dr.ClarkePearson on 04-01-2011, with extensive adhesions noted and oozing of ascites fluid from all peritoneal surfaces during that surgery; pathology had no residual cancer apparent. She resumed taxol/carboplatin for  an additional 3 cycles from 05-12-11 thru 07-01-2011 and has been on observation since then. Last CA 125 was   Review of systems as above, also: No recent infectious illness. Good energy, including swimming 3x weekly, and good appetite. Bowels moving regularly. No bleeding, no LE swelling. [facility] has helped with grief for husband's death last year. Remainder of 10 point Review of Systems negative.  Objective:  Vital signs in last 24 hours:  BP 150/83  Pulse 80  Temp(Src) 98 F (36.7 C) (Oral)  Resp 16  Ht 5\' 3"  (1.6 m)  Wt 186 lb 9.6 oz (84.641 kg)  BMI 33.06 kg/m2 Weight is stable from 04-2013, tho she had gained 14 lbs prior to March. Alert, oriented and appropriate. Ambulatory without difficulty.  No alopecia  HEENT:PERRL, sclerae not icteric. Oral mucosa moist without lesions, posterior pharynx clear.  Neck supple. No JVD.  Lymphatics:no cervical,suraclavicular, axillary or inguinal adenopathy Resp: clear to auscultation bilaterally and normal percussion bilaterally Cardio: regular rate and rhythm. No gallop. GI: soft, nontender, no mass or organomegaly. Normally active bowel sounds. Left sided ventral hernia at least 14 cm diameter, not tender. Musculoskeletal/ Extremities: without pitting edema, cords, tenderness Neuro: no peripheral neuropathy. Otherwise nonfocal Skin without rash, ecchymosis, petechiae   Lab Results:  Results for orders placed in visit on 07/29/13  CBC WITH DIFFERENTIAL      Result Value Ref Range   WBC 5.6  3.9 - 10.3 10e3/uL   NEUT# 3.7  1.5 - 6.5 10e3/uL   HGB 11.8  11.6 - 15.9 g/dL   HCT 36.8  34.8 - 46.6 %   Platelets 175  145 - 400 10e3/uL   MCV 88.0  79.5 -  101.0 fL   MCH 28.3  25.1 - 34.0 pg   MCHC 32.2  31.5 - 36.0 g/dL   RBC 4.18  3.70 - 5.45 10e6/uL   RDW 14.8 (*) 11.2 - 14.5 %   lymph# 0.9  0.9 - 3.3 10e3/uL   MONO# 0.7  0.1 - 0.9 10e3/uL   Eosinophils Absolute 0.2  0.0 - 0.5 10e3/uL   Basophils  Absolute 0.0  0.0 - 0.1 10e3/uL   NEUT% 65.3  38.4 - 76.8 %   LYMPH% 16.7  14.0 - 49.7 %   MONO% 13.1  0.0 - 14.0 %   EOS% 4.2  0.0 - 7.0 %   BASO% 0.7  0.0 - 2.0 %   CMET available after visit normal with exception of BUN of 28 and albumin of 3.4 CA125 available after visit 5.9, which will be communicated to patient by phone   Studies/Results:  No results found. Mammograms are at [facility] was 02-2011  Medications: I have reviewed the patient's current medications.  DISCUSSION: discussed ventral hernia as above. Discussed ongoing observation for the primary peritoneal cancer.   Assessment/Plan: 1.Serous gyn carcinoma: history as above, clinically continues to do well. She will see Dr Alycia Rossetti in 3 months with ca125 and will see me again in 6 months with cbc cmet ca125.  2.ventral hernia: From standpoint of the cancer, she seems fine for repair of ventral hernia if she wants to pursue that. 3.hx HTN, GERD, and SIADH with prior surgeries  4.flu and shingles vaccines done  5.loss of husband last year 6 mammograms at [facility] requested.        LIVESAY,LENNIS P, MD   07/29/2013, 9:39 AM

## 2013-08-01 ENCOUNTER — Telehealth: Payer: Self-pay

## 2013-08-01 NOTE — Telephone Encounter (Signed)
Told [name] the results of the Ca-125 as noted below by Dr. Marko Plume.  Pt. verbalized understanding.

## 2013-08-01 NOTE — Telephone Encounter (Signed)
Message copied by Baruch Merl on Mon Aug 01, 2013  9:40 AM ------      Message from: Gordy Levan      Created: Fri Jul 29, 2013  3:31 PM       Labs seen and need follow up: please let her know ca125 still in good range at 5.9 today ------

## 2013-08-10 ENCOUNTER — Telehealth: Payer: Self-pay | Admitting: Oncology

## 2013-08-10 NOTE — Telephone Encounter (Signed)
, °

## 2013-08-16 DIAGNOSIS — H35359 Cystoid macular degeneration, unspecified eye: Secondary | ICD-10-CM | POA: Diagnosis not present

## 2013-08-16 DIAGNOSIS — H348392 Tributary (branch) retinal vein occlusion, unspecified eye, stable: Secondary | ICD-10-CM | POA: Diagnosis not present

## 2013-08-23 DIAGNOSIS — D235 Other benign neoplasm of skin of trunk: Secondary | ICD-10-CM | POA: Diagnosis not present

## 2013-08-23 DIAGNOSIS — L821 Other seborrheic keratosis: Secondary | ICD-10-CM | POA: Diagnosis not present

## 2013-08-23 DIAGNOSIS — L819 Disorder of pigmentation, unspecified: Secondary | ICD-10-CM | POA: Diagnosis not present

## 2013-09-15 DIAGNOSIS — E782 Mixed hyperlipidemia: Secondary | ICD-10-CM | POA: Diagnosis not present

## 2013-09-15 DIAGNOSIS — I10 Essential (primary) hypertension: Secondary | ICD-10-CM | POA: Diagnosis not present

## 2013-09-15 DIAGNOSIS — J309 Allergic rhinitis, unspecified: Secondary | ICD-10-CM | POA: Diagnosis not present

## 2013-09-15 DIAGNOSIS — C569 Malignant neoplasm of unspecified ovary: Secondary | ICD-10-CM | POA: Diagnosis not present

## 2013-09-28 DIAGNOSIS — Z23 Encounter for immunization: Secondary | ICD-10-CM | POA: Diagnosis not present

## 2013-10-04 DIAGNOSIS — H04129 Dry eye syndrome of unspecified lacrimal gland: Secondary | ICD-10-CM | POA: Diagnosis not present

## 2013-10-04 DIAGNOSIS — H348392 Tributary (branch) retinal vein occlusion, unspecified eye, stable: Secondary | ICD-10-CM | POA: Diagnosis not present

## 2013-10-04 DIAGNOSIS — H40019 Open angle with borderline findings, low risk, unspecified eye: Secondary | ICD-10-CM | POA: Diagnosis not present

## 2013-10-04 DIAGNOSIS — H01029 Squamous blepharitis unspecified eye, unspecified eyelid: Secondary | ICD-10-CM | POA: Diagnosis not present

## 2013-10-04 DIAGNOSIS — H2510 Age-related nuclear cataract, unspecified eye: Secondary | ICD-10-CM | POA: Diagnosis not present

## 2013-10-04 DIAGNOSIS — H0019 Chalazion unspecified eye, unspecified eyelid: Secondary | ICD-10-CM | POA: Diagnosis not present

## 2013-10-09 IMAGING — CT CT ABD-PELV W/ CM
2 of 6 series · 17 of 46 positions shown, 19 images · IV contrast (APPLIED)
Comparison: 01/09/2011

CLINICAL DATA: Ovarian carcinoma status post chemotherapy

CT ABDOMEN AND PELVIS WITH CONTRAST
TECHNIQUE: Multidetector CT imaging of the abdomen and pelvis was
performed following the standard protocol during bolus
administration of intravenous contrast.
Contrast: 100mL OMNIPAQUE IOHEXOL 300 MG/ML IV SOLN

[Series 2: rtn a/p with · axial · 0.77mm/px · z∈[-436,-50]mm · 14 of 89 slices shown, 16 images]
[im 6/89  soft-tissue]
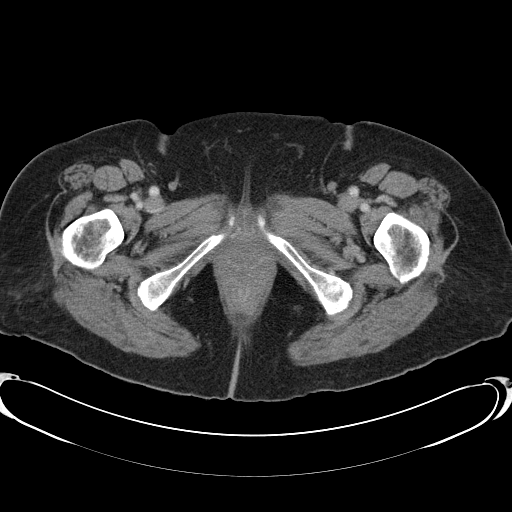
[im 6/89  bone]
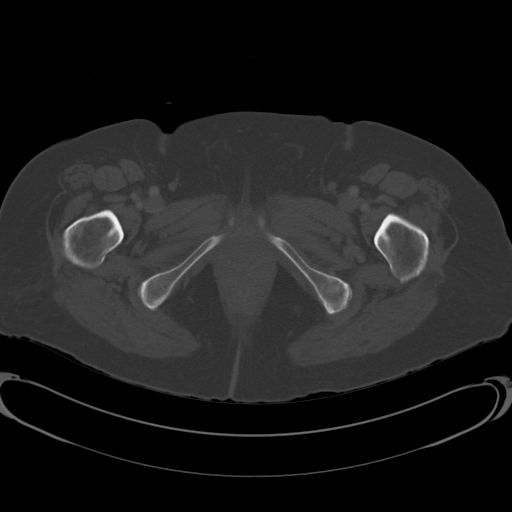
[im 11/89  soft-tissue]
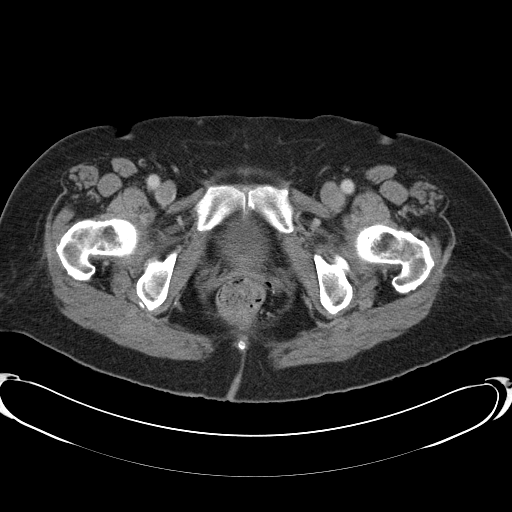
[im 16/89  soft-tissue]
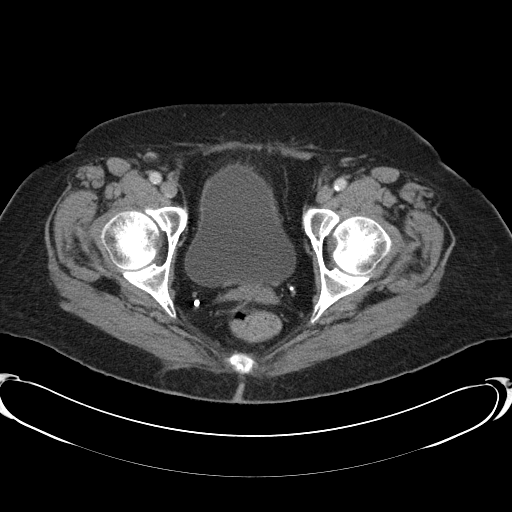
[im 26/89  soft-tissue]
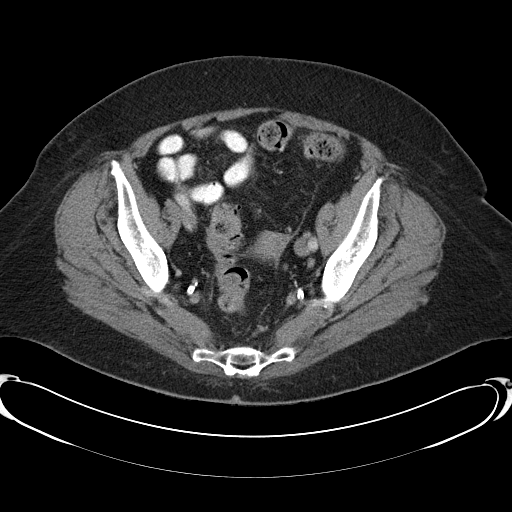
[im 32/89  soft-tissue]
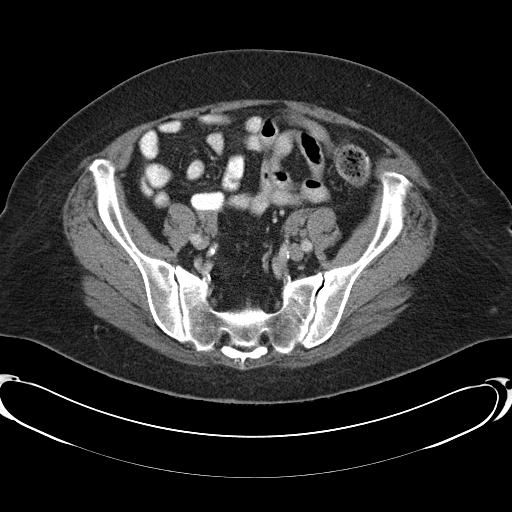
[im 37/89  soft-tissue]
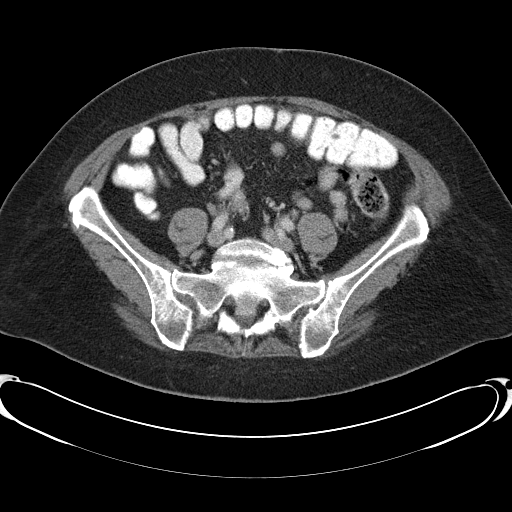
[im 42/89  soft-tissue]
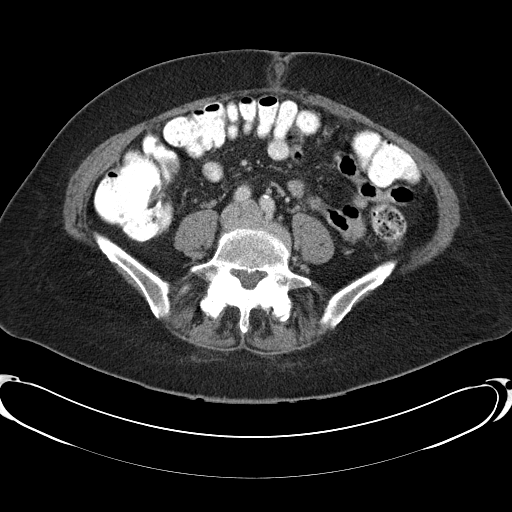
[im 47/89  soft-tissue]
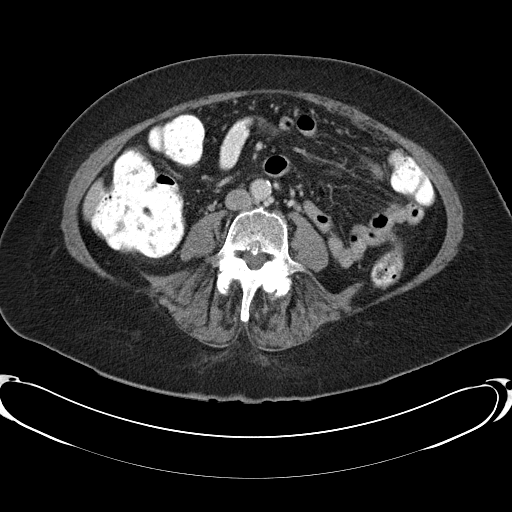
[im 52/89  soft-tissue]
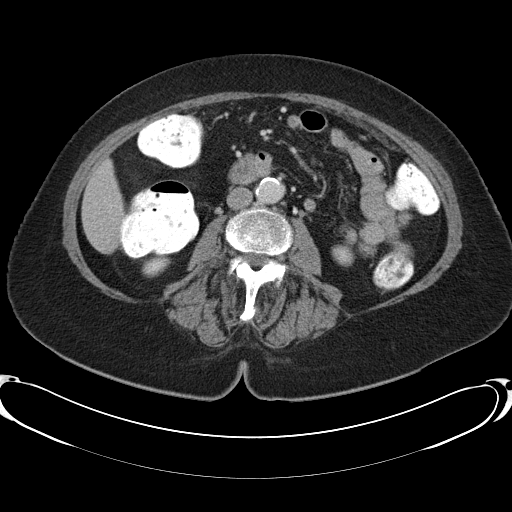
[im 52/89  bone]
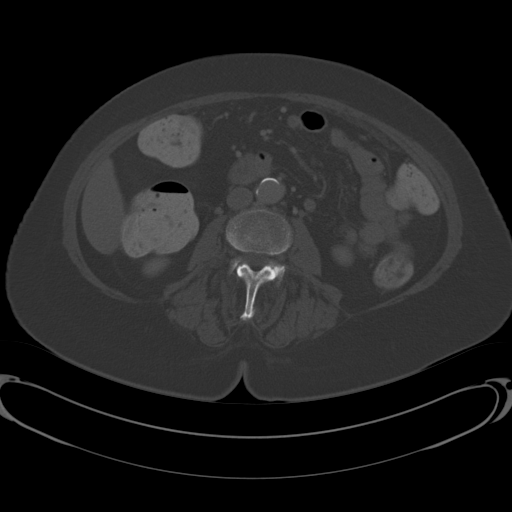
[im 57/89  soft-tissue]
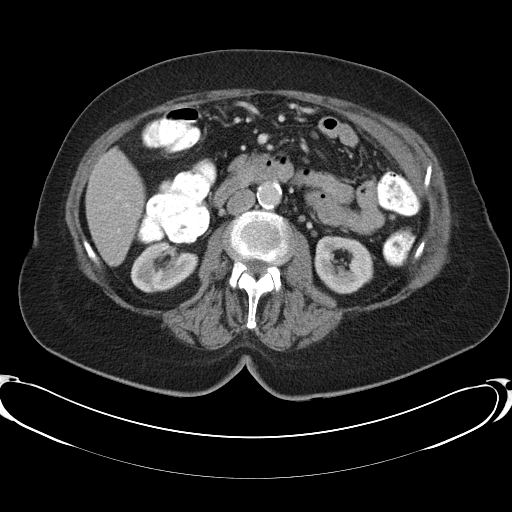
[im 68/89  soft-tissue]
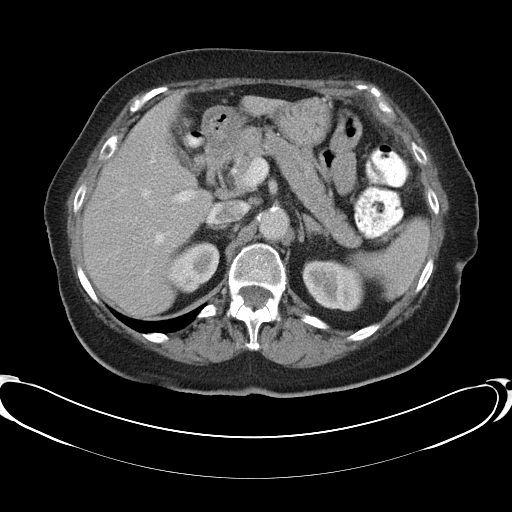
[im 73/89  soft-tissue]
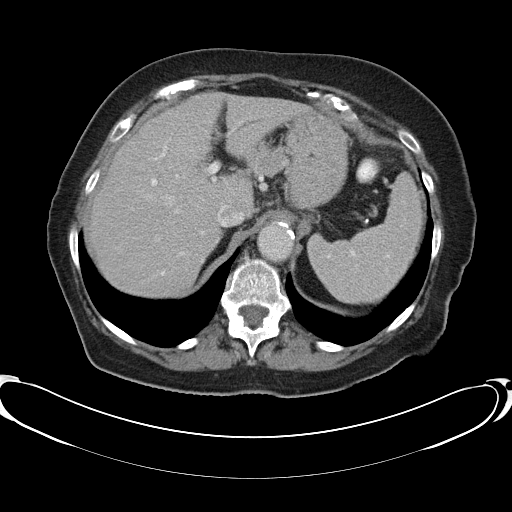
[im 78/89  soft-tissue]
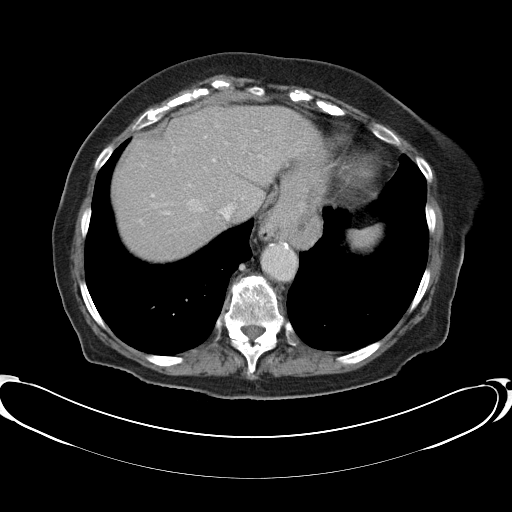
[im 83/89  soft-tissue]
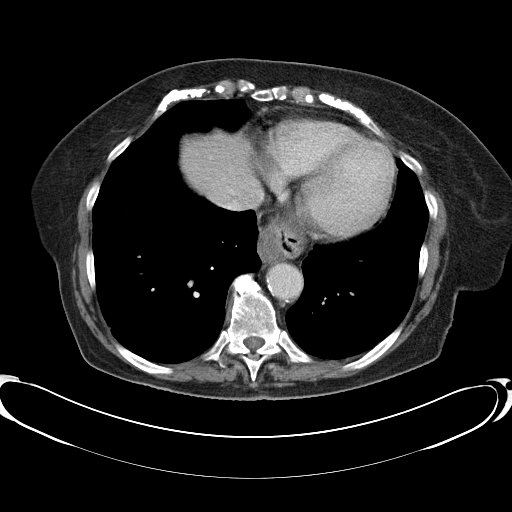

[Series 602: <mpr thick range> · coronal · 0.86mm/px · 3 of 78 slices shown]
[im 26/78  soft-tissue]
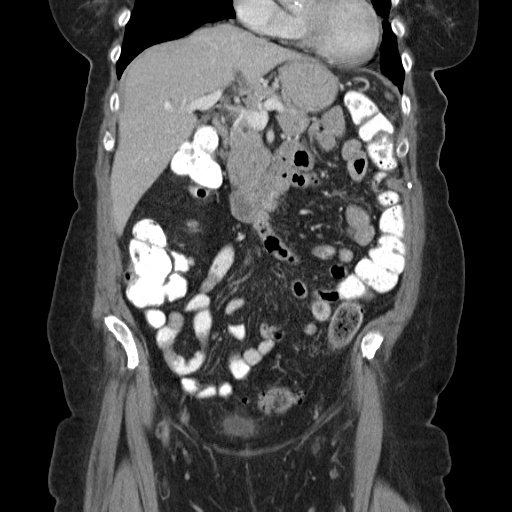
[im 35/78  soft-tissue]
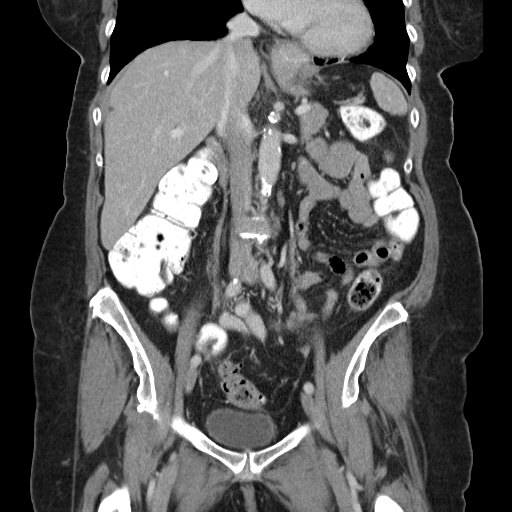
[im 43/78  soft-tissue]
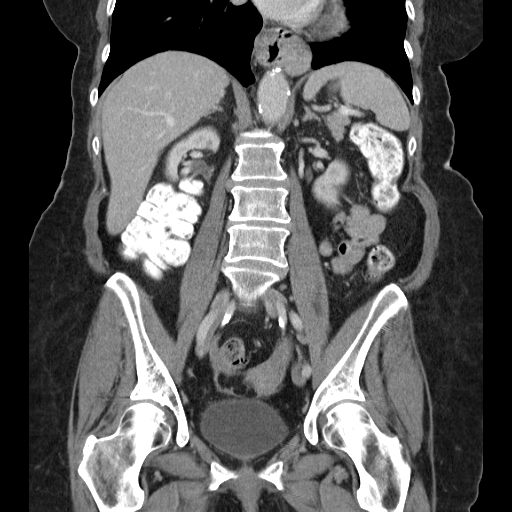

[17 of 46 positions shown; findings below may reference images not displayed]

FINDINGS: Visualized lung bases clear.  There is moderate hiatal
hernia.  Patchy calcified aortic plaque without aneurysm.
Unremarkable liver, spleen, adrenal glands, pancreas, kidneys.
Small bowel decompressed.

Interval resolution of the abdominal ascites with significant
decrease in the omental caking, only mild residual evident in the
anterolateral aspect of the left upper abdomen.

Colon is nondilated with scattered sigmoid diverticula; no adjacent
inflammatory edematous change.  Uterus and adnexal regions
unremarkable.  Urinary bladder physiologically distended.  Sub
centimeter left para-aortic and aortocaval nodes are stable.  No
mesenteric or pelvic adenopathy.  Advanced facet degenerative
changes in the lower lumbar spine, allowing grade 1 anterolisthesis
L4-5.  Normal bilateral renal excretion into decompressed
collecting systems on delayed scans.
IMPRESSION: 1.  Interval resolution of abdominal ascites and significant
improvement in the  omental disease.
2.  Hiatal hernia.
3.  Sigmoid diverticulosis.

## 2013-11-02 DIAGNOSIS — H348392 Tributary (branch) retinal vein occlusion, unspecified eye, stable: Secondary | ICD-10-CM | POA: Diagnosis not present

## 2013-11-02 DIAGNOSIS — H35359 Cystoid macular degeneration, unspecified eye: Secondary | ICD-10-CM | POA: Diagnosis not present

## 2013-11-03 DIAGNOSIS — L570 Actinic keratosis: Secondary | ICD-10-CM | POA: Diagnosis not present

## 2013-11-03 DIAGNOSIS — L819 Disorder of pigmentation, unspecified: Secondary | ICD-10-CM | POA: Diagnosis not present

## 2013-11-04 DIAGNOSIS — H0019 Chalazion unspecified eye, unspecified eyelid: Secondary | ICD-10-CM | POA: Diagnosis not present

## 2013-11-04 DIAGNOSIS — H2510 Age-related nuclear cataract, unspecified eye: Secondary | ICD-10-CM | POA: Diagnosis not present

## 2013-11-04 DIAGNOSIS — H04129 Dry eye syndrome of unspecified lacrimal gland: Secondary | ICD-10-CM | POA: Diagnosis not present

## 2013-11-04 DIAGNOSIS — H01029 Squamous blepharitis unspecified eye, unspecified eyelid: Secondary | ICD-10-CM | POA: Diagnosis not present

## 2013-11-24 ENCOUNTER — Other Ambulatory Visit (HOSPITAL_BASED_OUTPATIENT_CLINIC_OR_DEPARTMENT_OTHER): Payer: Medicare Other

## 2013-11-24 DIAGNOSIS — C569 Malignant neoplasm of unspecified ovary: Secondary | ICD-10-CM | POA: Diagnosis not present

## 2013-11-25 LAB — CA 125: CA 125: 7 U/mL (ref <35)

## 2013-11-25 LAB — CA 125(PREVIOUS METHOD): CA 125: 5.8 U/mL (ref 0.0–30.2)

## 2013-12-01 ENCOUNTER — Ambulatory Visit: Payer: Medicare Other | Attending: Gynecologic Oncology | Admitting: Gynecologic Oncology

## 2013-12-01 ENCOUNTER — Telehealth: Payer: Self-pay | Admitting: Oncology

## 2013-12-01 ENCOUNTER — Encounter: Payer: Self-pay | Admitting: Gynecologic Oncology

## 2013-12-01 ENCOUNTER — Other Ambulatory Visit: Payer: Medicare Other

## 2013-12-01 VITALS — BP 169/77 | HR 69 | Temp 98.4°F | Resp 20 | Ht 63.0 in | Wt 193.2 lb

## 2013-12-01 DIAGNOSIS — Z9221 Personal history of antineoplastic chemotherapy: Secondary | ICD-10-CM | POA: Insufficient documentation

## 2013-12-01 DIAGNOSIS — I10 Essential (primary) hypertension: Secondary | ICD-10-CM | POA: Diagnosis not present

## 2013-12-01 DIAGNOSIS — Z79899 Other long term (current) drug therapy: Secondary | ICD-10-CM | POA: Insufficient documentation

## 2013-12-01 DIAGNOSIS — Z87891 Personal history of nicotine dependence: Secondary | ICD-10-CM | POA: Diagnosis not present

## 2013-12-01 DIAGNOSIS — L299 Pruritus, unspecified: Secondary | ICD-10-CM | POA: Insufficient documentation

## 2013-12-01 DIAGNOSIS — L292 Pruritus vulvae: Secondary | ICD-10-CM | POA: Diagnosis not present

## 2013-12-01 DIAGNOSIS — N898 Other specified noninflammatory disorders of vagina: Secondary | ICD-10-CM

## 2013-12-01 DIAGNOSIS — R635 Abnormal weight gain: Secondary | ICD-10-CM | POA: Diagnosis not present

## 2013-12-01 DIAGNOSIS — Z8543 Personal history of malignant neoplasm of ovary: Secondary | ICD-10-CM

## 2013-12-01 DIAGNOSIS — C569 Malignant neoplasm of unspecified ovary: Secondary | ICD-10-CM | POA: Insufficient documentation

## 2013-12-01 DIAGNOSIS — R3915 Urgency of urination: Secondary | ICD-10-CM | POA: Diagnosis not present

## 2013-12-01 DIAGNOSIS — Z23 Encounter for immunization: Secondary | ICD-10-CM | POA: Diagnosis not present

## 2013-12-01 NOTE — Patient Instructions (Signed)
Use Dove soap and avoid Dial. Use nystatin alone on the area on your abdomen and on your labia where you have yeast.    Calorie Counting for Weight Loss Calories are energy you get from the things you eat and drink. Your body uses this energy to keep you going throughout the day. The number of calories you eat affects your weight. When you eat more calories than your body needs, your body stores the extra calories as fat. When you eat fewer calories than your body needs, your body burns fat to get the energy it needs. Calorie counting means keeping track of how many calories you eat and drink each day. If you make sure to eat fewer calories than your body needs, you should lose weight. In order for calorie counting to work, you will need to eat the number of calories that are right for you in a day to lose a healthy amount of weight per week. A healthy amount of weight to lose per week is usually 1-2 lb (0.5-0.9 kg). A dietitian can determine how many calories you need in a day and give you suggestions on how to reach your calorie goal.  WHAT IS MY MY PLAN? Cut back on portion sizes WHAT DO I NEED TO KNOW ABOUT CALORIE COUNTING? In order to meet your daily calorie goal, you will need to:  Find out how many calories are in each food you would like to eat. Try to do this before you eat.  Decide how much of the food you can eat.  Write down what you ate and how many calories it had. Doing this is called keeping a food log. WHERE DO I FIND CALORIE INFORMATION? The number of calories in a food can be found on a Nutrition Facts label. Note that all the information on a label is based on a specific serving of the food. If a food does not have a Nutrition Facts label, try to look up the calories online or ask your dietitian for help. HOW DO I DECIDE HOW MUCH TO EAT? To decide how much of the food you can eat, you will need to consider both the number of calories in one serving and the size of one  serving. This information can be found on the Nutrition Facts label. If a food does not have a Nutrition Facts label, look up the information online or ask your dietitian for help. Remember that calories are listed per serving. If you choose to have more than one serving of a food, you will have to multiply the calories per serving by the amount of servings you plan to eat. For example, the label on a package of bread might say that a serving size is 1 slice and that there are 90 calories in a serving. If you eat 1 slice, you will have eaten 90 calories. If you eat 2 slices, you will have eaten 180 calories. HOW DO I KEEP A FOOD LOG? After each meal, record the following information in your food log:  What you ate.  How much of it you ate.  How many calories it had.  Then, add up your calories. Keep your food log near you, such as in a small notebook in your pocket. Another option is to use a mobile app or website. Some programs will calculate calories for you and show you how many calories you have left each time you add an item to the log. WHAT ARE SOME CALORIE COUNTING TIPS?  Use your calories on foods and drinks that will fill you up and not leave you hungry. Some examples of this include foods like nuts and nut butters, vegetables, lean proteins, and high-fiber foods (more than 5 g fiber per serving).  Eat nutritious foods and avoid empty calories. Empty calories are calories you get from foods or beverages that do not have many nutrients, such as candy and soda. It is better to have a nutritious high-calorie food (such as an avocado) than a food with few nutrients (such as a bag of chips).  Know how many calories are in the foods you eat most often. This way, you do not have to look up how many calories they have each time you eat them.  Look out for foods that may seem like low-calorie foods but are really high-calorie foods, such as baked goods, soda, and fat-free candy.  Pay attention  to calories in drinks. Drinks such as sodas, specialty coffee drinks, alcohol, and juices have a lot of calories yet do not fill you up. Choose low-calorie drinks like water and diet drinks.  Focus your calorie counting efforts on higher calorie items. Logging the calories in a garden salad that contains only vegetables is less important than calculating the calories in a milk shake.  Find a way of tracking calories that works for you. Get creative. Most people who are successful find ways to keep track of how much they eat in a day, even if they do not count every calorie. WHAT ARE SOME PORTION CONTROL TIPS?  Know how many calories are in a serving. This will help you know how many servings of a certain food you can have.  Use a measuring cup to measure serving sizes. This is helpful when you start out. With time, you will be able to estimate serving sizes for some foods.  Take some time to put servings of different foods on your favorite plates, bowls, and cups so you know what a serving looks like.  Try not to eat straight from a bag or box. Doing this can lead to overeating. Put the amount you would like to eat in a cup or on a plate to make sure you are eating the right portion.  Use smaller plates, glasses, and bowls to prevent overeating. This is a quick and easy way to practice portion control. If your plate is smaller, less food can fit on it.  Try not to multitask while eating, such as watching TV or using your computer. If it is time to eat, sit down at a table and enjoy your food. Doing this will help you to start recognizing when you are full. It will also make you more aware of what and how much you are eating. HOW CAN I CALORIE COUNT WHEN EATING OUT?  Ask for smaller portion sizes or child-sized portions.  Consider sharing an entree and sides instead of getting your own entree.  If you get your own entree, eat only half. Ask for a box at the beginning of your meal and put the  rest of your entree in it so you are not tempted to eat it.  Look for the calories on the menu. If calories are listed, choose the lower calorie options.  Choose dishes that include vegetables, fruits, whole grains, low-fat dairy products, and lean protein. Focusing on smart food choices from each of the 5 food groups can help you stay on track at restaurants.  Choose items that are boiled, broiled,  grilled, or steamed.  Choose water, milk, unsweetened iced tea, or other drinks without added sugars. If you want an alcoholic beverage, choose a lower calorie option. For example, a regular margarita can have up to 700 calories and a glass of wine has around 150.  Stay away from items that are buttered, battered, fried, or served with cream sauce. Items labeled "crispy" are usually fried, unless stated otherwise.  Ask for dressings, sauces, and syrups on the side. These are usually very high in calories, so do not eat much of them.  Watch out for salads. Many people think salads are a healthy option, but this is often not the case. Many salads come with bacon, fried chicken, lots of cheese, fried chips, and dressing. All of these items have a lot of calories. If you want a salad, choose a garden salad and ask for grilled meats or steak. Ask for the dressing on the side, or ask for olive oil and vinegar or lemon to use as dressing.  Estimate how many servings of a food you are given. For example, a serving of cooked rice is  cup or about the size of half a tennis ball or one cupcake wrapper. Knowing serving sizes will help you be aware of how much food you are eating at restaurants. The list below tells you how big or small some common portion sizes are based on everyday objects.  1 oz--4 stacked dice.  3 oz--1 deck of cards.  1 tsp--1 dice.  1 Tbsp-- a Ping-Pong ball.  2 Tbsp--1 Ping-Pong ball.   cup--1 tennis ball or 1 cupcake wrapper.  1 cup--1 baseball. Document Released: 01/27/2005  Document Revised: 06/13/2013 Document Reviewed: 12/02/2012 [facility] Patient Information 2015 [place], [place]. This information is not intended to replace advice given to you by your health care provider. Make sure you discuss any questions you have with your health care provider.

## 2013-12-01 NOTE — Progress Notes (Signed)
Consult Note: Gyn-Onc  [name] 80 y.o. female  CC:  Chief Complaint  Patient presents with  . 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 Her illness began with several weeks of diarrhea and abdominal bloating the fall of 2012. She was tested several times for C. difficile was negative. Dr. Ronita Hipps at that time recommended an ultrasound and ultimately a CT scan. Ultrasound showed significant ascites and her CA 125 was markedly elevated which is when we saw her. CT imaging was most consistent with markedly advanced disease that would most likely preclude optimal debulking. This in conjunction with her poor performance status the decision was made to proceed with neoadjuvant chemotherapy. She had a high-volume paracentesis to confirm the diagnosis of a gynecologic malignancy.   She had 3 cycles of neoadjuvant chemotherapy under the care of Dr. Marko Plume with paclitaxel and carboplatin. Her CA 125 prior cycle #1 was 204 and on February 28, 2011 prior to cycle #2 her CA 125 was 12.6. She also had a CT scan that showed marked resolution of her ascites and marked improvement in the omental caking. There is no mention of any significant carcinomatosis. There is no significant adenopathy. The adnexa and uterus are within normal limits.  After discussion of pros and cons she opted for definitive surgical management. On 04/01/2011 she underwent surgery by Dr. Fermin Schwab. She underwent exploratory laparotomy TAH/BSO and omental resection. There was no gross evidence of disease. Pathology revealed no residual carcinoma within the omentum. There was stromal fibrosis and chronic inflammation and scattered psammoma bodies consistent with posttreatment effect. Within the right ovary and tube was no residual carcinoma. There was benign ovarian tissue with associated serosal adhesions necrosis and scattered psammoma bodies. This was seen on the  uterine serosa. The uterus was otherwise negative. The left tube and ovary also had associated psammoma bodies but no residual carcinoma identified. She did well postoperatively and after lengthy discussions underwent an additional 3 cycles of chemotherapy which she completed in 06-20-11. CA-125 at that time was 4.2 and on 07/23/11 it was 3.3. Subsequent CA-125's have been less than 5-6 with her last one as 7 on 11/24/13 and labs.  The most notable history is that in August 2014 her husband fell while playing ping-pong and hit his head. He had a large intracerebral bleed and it is unclear whether he had a stroke, an aneurysm rupture or related to trauma. They decided to withdraw. They have been married normal 60 years. Her husband was about to turn 12. This is been very stressful for her.  Interval History: I last saw her in 3/15 and Dr. Marko Plume saw her in 6/15. She's overall doing fairly well. She recently returned from the funeral of a great-grandson. Her granddaughter had platelet condition and delivered her little boy at 6 months recently passed away after 2 weeks of life. She seems to be coping well with this. The patient is frustrated by her weight gain. She continues to make poor food choices and recently it [employer]. She's not exercising. She's eating ice cream every night a retirement home. She does believe that the hernia has gotten bigger. It is not causing her any pain. She does experience some urge incontinence in the morning. She has no nocturia and is able to sleep overnight without voiding. She does complain of some external irritation on the vulva and is using her nystatin. She is back to writing. She states that she's  staying very busy enjoying an excellent quality of life.    Review of Systems:  Constitutional: Denies fever. + weight gain Skin: No rash, + itching under pannus and on vulva Cardiovascular: No chest pain, shortness of breath, or edema  Pulmonary: No cough Gastro  Intestinal:No nausea, vomiting, constipation, or diarrhea reported. No bright red blood per rectum or change in bowel movement. + hernia larger, no pain Genitourinary: No frequency, + urgency in the morning only, or dysuria.  Denies vaginal bleeding and discharge.  Musculoskeletal: No myalgia, arthralgia, joint swelling or pain. Neurologic: No weakness Psychology: Feeling well and staying busy.  Current Meds:  Outpatient Encounter Prescriptions as of 12/01/2013  Medication Sig  . Calcium Citrate-Vitamin D (CITRACAL + D PO) Take 2 tablets by mouth daily.  . Cholecalciferol (VITAMIN D) 2000 UNITS tablet Take 2,000 Units by mouth daily.   Marland Kitchen lisinopril (PRINIVIL,ZESTRIL) 10 MG tablet Take 10 mg by mouth daily after breakfast.   . Multiple Vitamins-Minerals (CENTRUM SILVER PO) Take 1 tablet by mouth daily.   . nadolol (CORGARD) 20 MG tablet Take 10 mg by mouth daily after breakfast.   . nystatin cream (MYCOSTATIN) Apply 1 application topically 3 (three) times daily as needed. Applies to Outside of vagina and anus for Itching  . nystatin-triamcinolone ointment (MYCOLOG) Apply topically 2 (two) times daily as needed.  Marland Kitchen omega-3 acid ethyl esters (LOVAZA) 1 G capsule Take 1 g by mouth daily.  Marland Kitchen omeprazole (PRILOSEC) 20 MG capsule Take 20 mg by mouth Daily.  . Red Yeast Rice Extract (RED YEAST RICE PO) Take 1 capsule by mouth 2 (two) times daily.  . vitamin E (VITAMIN E) 400 UNIT capsule Take 400 Units by mouth daily.  Marland Kitchen zolpidem (AMBIEN) 10 MG tablet Take 10 mg by mouth at bedtime.   Marland Kitchen azelastine (ASTELIN) 137 MCG/SPRAY nasal spray Place 1 spray into the nose as needed. Use in each nostril as directed  . ibuprofen (ADVIL,MOTRIN) 200 MG tablet Take 200 mg by mouth every 6 (six) hours as needed. pain  . PATADAY 0.2 % SOLN   . Polyethyl Glycol-Propyl Glycol (SYSTANE) 0.4-0.3 % SOLN Place 1 drop into both eyes 4 (four) times daily.  . Ranibizumab (LUCENTIS IO) Place into the left eye once as needed. Pt  receive from dr. Zadie Rhine office [phone]    Allergy:  Allergies  Allergen Reactions  . Morphine And Related     Given after knee replacement and code blue occurred  . Penicillins     REACTION: hives  . Shellfish Allergy     Pt has shellfish allergy only.  Has had IV contrast x 2 and did fine.    Social Hx:   History   Social History  . Marital Status: Widowed    Spouse Name: N/A    Number of Children: N/A  . Years of Education: N/A   Occupational History  . Not on file.   Social History Main Topics  . Smoking status: Former Smoker    Quit date: 02/11/1952  . Smokeless tobacco: Never Used  . Alcohol Use: No  . Drug Use: No  . Sexual Activity: No   Other Topics Concern  . Not on file   Social History Narrative  . No narrative on file    Past Surgical Hx:  Past Surgical History  Procedure Laterality Date  . Tonsillectomy    . Appendectomy    . Joint replacement      R knee in 2008, 5 operations on  L knee  . Other surgical history      hx of C section 1966  . Laparotomy  04/01/2011    Procedure: EXPLORATORY LAPAROTOMY;  Surgeon: Alvino Chapel, MD;  Location: [facility];  Service: Gynecology;  Laterality: N/A;  . Abdominal hysterectomy  04/01/2011    Procedure: HYSTERECTOMY ABDOMINAL;  Surgeon: Alvino Chapel, MD;  Location: [facility];  Service: Gynecology;  Laterality: N/A;  . Salpingoophorectomy  04/01/2011    Procedure: SALPINGO OOPHERECTOMY;  Surgeon: Alvino Chapel, MD;  Location: [facility];  Service: Gynecology;  Laterality: Bilateral;    Past Medical Hx:  Past Medical History  Diagnosis Date  . Ascites   . Cystitis   . GERD (gastroesophageal reflux disease)   . Hypertension   . Hyponatremia   . Hyperlipidemia   . Osteopenia   . Postmenopausal atrophic vaginitis   . Vulvitis   . Staph infection 2010    after knee replacement  . Urinary frequency   . Complication of anesthesia     Sodium drops per pt   . Pneumonia     hx  of   . Anemia     hx of   . Blood transfusion     hx of 2011   . H/O hiatal hernia   . Arthritis     knees   . Ovarian cancer 01/25/2011  . DIARRHEA, ANTIBIOTIC ASSOCIATED 05/31/2009    Qualifier: Diagnosis of  By: Tommy Medal MD, Roderic Scarce    . ARTHRITIS, SEPTIC 05/28/2009    Qualifier: Diagnosis of  By: Tommy Medal MD, Roderic Scarce    . Anemia associated with acute blood loss 04/03/2011  . OSTEOMYELITIS, CHRONIC, LOWER LEG 04/23/2009    Qualifier: Diagnosis of  By: Johnnye Sima MD, Dellis Filbert    . OSTEOPOROSIS 04/23/2009    Qualifier: Diagnosis of  By: Johnnye Sima MD, Dellis Filbert    . PROSTHETIC JOINT COMPLICATION 3/41/9622    Qualifier: Diagnosis of  By: Tommy Medal MD, Roderic Scarce    . Neutropenia with fever 05/18/2011    Oncology Hx:    Ovarian cancer   01/25/2011 Initial Diagnosis Ovarian cancer   01/27/2011 - 06/23/2011 Chemotherapy 6 cycles paclitaxel and carboplatin   04/01/2011 Surgery optimal debulking post three cycles of neoadjuvant chemotherapy followed by three cycles of chemotherapy for a total of 6    Family Hx:  Family History  Problem Relation Age of Onset  . Heart attack Brother   . Diabetes Brother   . Cancer Other     Bladder cancer    Vitals:  Blood pressure 169/77, pulse 69, temperature 98.4 F (36.9 C), temperature source Oral, resp. rate 20, height 5\' 3"  (1.6 m), weight 193 lb 3.2 oz (87.635 kg).  Physical Exam:  Well-nourished well-developed female in no acute distress.   Neck: Supple, no lymphadenopathy, no thyromegaly.   Lungs: Clear to auscultation bilaterally.   Cardiovascular: Regular rate and rhythm.   Abdomen: Well-healed vertical incision. She has a reducible, non tender incisional hernia towards the left side of her incision and a palpable 7 cm opening in the fascia is appreciated. Easily reducible. Abdomen is soft, nontender, nondistended. + candida under pannus.  Groins: No lymphadenopathy.   Extremities: No edema.   Pelvic: Atrophic external genitalia. +  erythema c/w candida. There were no palpable masses on bimanual examination. There is no nodularity. Rectal confirms.   Assessment/Plan:  80 year old diagnosed with advanced serous either primary peritoneal or ovarian carcinoma in December of 2012. Based on CT findings she  was treated with neoadjuvant chemotherapy with carboplatin and paclitaxel from 01/27/2011 through March 10 2011. Her CA125 responded nicely as did her imaging. She had an optimal cytoreductive surgery by Dr. Fermin Schwab. Final pathology revealed no evidence of disease. In an additional 3 cycles of chemotherapy after her debulking surgery were given and she has had no evidence of disease since that time. Her clinical exam today is unremarkable. Her CA-125s are also negative.  She will followup with Dr. Marko Plume in 2-3 months and return to see [place] to 3 months after that.   She will use nystatin prn and was encouraged to watch her portion sizes and to cut back on the sweets that she is eating. She has gained 20# this year.   Matis Monnier A., MD 12/01/2013, 10:32 AM

## 2013-12-01 NOTE — Telephone Encounter (Signed)
PT STOPPED BY SCHEDULING TODAY AFTER GYNONC VISIT - PER PT SHE WAS TOLD BY DR Alycia Rossetti THAT SHE IS TO SEE [facility] IN 3MOS. PT PREVIOUSLY HAS [facility] FOR DEC AND APPT AS CXD. DEC APPTS R/S TO 03/01/14. PT HAS NEW SCHEDULE.

## 2014-01-14 ENCOUNTER — Telehealth: Payer: Self-pay | Admitting: Oncology

## 2014-01-14 NOTE — Telephone Encounter (Signed)
s.w. pt and advised on Jan d.t change....pt ok and aware

## 2014-01-19 DIAGNOSIS — R350 Frequency of micturition: Secondary | ICD-10-CM | POA: Diagnosis not present

## 2014-01-19 DIAGNOSIS — J069 Acute upper respiratory infection, unspecified: Secondary | ICD-10-CM | POA: Diagnosis not present

## 2014-01-25 ENCOUNTER — Other Ambulatory Visit: Payer: Medicare Other

## 2014-01-25 ENCOUNTER — Ambulatory Visit: Payer: Medicare Other | Admitting: Oncology

## 2014-01-25 DIAGNOSIS — R05 Cough: Secondary | ICD-10-CM | POA: Diagnosis not present

## 2014-01-26 ENCOUNTER — Other Ambulatory Visit: Payer: Self-pay | Admitting: Nurse Practitioner

## 2014-02-20 DIAGNOSIS — Z1231 Encounter for screening mammogram for malignant neoplasm of breast: Secondary | ICD-10-CM | POA: Diagnosis not present

## 2014-02-20 DIAGNOSIS — M8580 Other specified disorders of bone density and structure, unspecified site: Secondary | ICD-10-CM | POA: Diagnosis not present

## 2014-03-01 ENCOUNTER — Ambulatory Visit: Payer: Medicare Other | Admitting: Oncology

## 2014-03-01 ENCOUNTER — Other Ambulatory Visit: Payer: Medicare Other

## 2014-03-02 ENCOUNTER — Ambulatory Visit (HOSPITAL_BASED_OUTPATIENT_CLINIC_OR_DEPARTMENT_OTHER): Payer: Medicare Other | Admitting: Oncology

## 2014-03-02 ENCOUNTER — Encounter: Payer: Self-pay | Admitting: Oncology

## 2014-03-02 ENCOUNTER — Other Ambulatory Visit (HOSPITAL_BASED_OUTPATIENT_CLINIC_OR_DEPARTMENT_OTHER): Payer: Medicare Other

## 2014-03-02 ENCOUNTER — Telehealth: Payer: Self-pay | Admitting: Oncology

## 2014-03-02 ENCOUNTER — Other Ambulatory Visit: Payer: Self-pay | Admitting: Oncology

## 2014-03-02 VITALS — BP 149/66 | HR 66 | Temp 98.4°F | Resp 18 | Ht 63.0 in | Wt 198.6 lb

## 2014-03-02 DIAGNOSIS — E669 Obesity, unspecified: Secondary | ICD-10-CM

## 2014-03-02 DIAGNOSIS — C569 Malignant neoplasm of unspecified ovary: Secondary | ICD-10-CM | POA: Diagnosis not present

## 2014-03-02 DIAGNOSIS — L570 Actinic keratosis: Secondary | ICD-10-CM | POA: Diagnosis not present

## 2014-03-02 DIAGNOSIS — L821 Other seborrheic keratosis: Secondary | ICD-10-CM | POA: Diagnosis not present

## 2014-03-02 DIAGNOSIS — Z8543 Personal history of malignant neoplasm of ovary: Secondary | ICD-10-CM

## 2014-03-02 DIAGNOSIS — C482 Malignant neoplasm of peritoneum, unspecified: Secondary | ICD-10-CM

## 2014-03-02 DIAGNOSIS — K432 Incisional hernia without obstruction or gangrene: Secondary | ICD-10-CM

## 2014-03-02 LAB — COMPREHENSIVE METABOLIC PANEL (CC13)
ALT: 12 U/L (ref 0–55)
AST: 20 U/L (ref 5–34)
Albumin: 3.8 g/dL (ref 3.5–5.0)
Alkaline Phosphatase: 54 U/L (ref 40–150)
Anion Gap: 9 mEq/L (ref 3–11)
BUN: 33.5 mg/dL — ABNORMAL HIGH (ref 7.0–26.0)
CO2: 25 mEq/L (ref 22–29)
Calcium: 9.3 mg/dL (ref 8.4–10.4)
Chloride: 104 mEq/L (ref 98–109)
Creatinine: 1.1 mg/dL (ref 0.6–1.1)
EGFR: 49 mL/min/{1.73_m2} — ABNORMAL LOW (ref >90)
Glucose: 99 mg/dl (ref 70–140)
Potassium: 4.7 mEq/L (ref 3.5–5.1)
Sodium: 138 mEq/L (ref 136–145)
Total Bilirubin: 0.37 mg/dL (ref 0.20–1.20)
Total Protein: 7.2 g/dL (ref 6.4–8.3)

## 2014-03-02 LAB — CBC WITH DIFFERENTIAL/PLATELET
BASO%: 1.4 % (ref 0.0–2.0)
Basophils Absolute: 0.1 10*3/uL (ref 0.0–0.1)
EOS%: 3.9 % (ref 0.0–7.0)
Eosinophils Absolute: 0.2 10*3/uL (ref 0.0–0.5)
HCT: 38.5 % (ref 34.8–46.6)
HGB: 12.2 g/dL (ref 11.6–15.9)
LYMPH%: 20.6 % (ref 14.0–49.7)
MCH: 27.6 pg (ref 25.1–34.0)
MCHC: 31.8 g/dL (ref 31.5–36.0)
MCV: 86.7 fL (ref 79.5–101.0)
MONO#: 0.7 10*3/uL (ref 0.1–0.9)
MONO%: 13.6 % (ref 0.0–14.0)
NEUT#: 3 10*3/uL (ref 1.5–6.5)
NEUT%: 60.5 % (ref 38.4–76.8)
Platelets: 200 10*3/uL (ref 145–400)
RBC: 4.44 10*6/uL (ref 3.70–5.45)
RDW: 15.2 % — ABNORMAL HIGH (ref 11.2–14.5)
WBC: 5 10*3/uL (ref 3.9–10.3)
lymph#: 1 10*3/uL (ref 0.9–3.3)

## 2014-03-02 NOTE — Progress Notes (Signed)
OFFICE PROGRESS NOTE     Physicians:P.Gehrig, R.Taavon, K.Little, J.Hayes, G.Rankin, S.Gross  INTERVAL HISTORY:  Patient is seen, alone for visit, in scheduled follow up of IIIC serous primary peritoneal carcinoma, on observation since completing chemotherapy in 06-2011. Last CT AP was 02-2011; she saw Dr Alycia Rossetti 12-01-13 and will see her again 3 months from now. CA 125 was elevated at presentation.   Patient had bronchitis for most of Dec 2015, treated by PCP with azithromycin and no pneumonia by CXR. She is feeling much better now, but improvement has been slow. She was very inactive thru Dec, but appetite has been good and she finds it difficult to limit sweets, so has gained at least 12 lbs. The large ventral hernia is not painful, tho it is particularly noticeable to her when she lies on side. Bowels are moving regularly, no abdominal or pelvic pain. Energy is back to baseline now.  No PAC Flu vaccine done Prevnar done prior to the respiratory infection.  ONCOLOGIC HISTORY Patient presented with several weeks of bloating and diarrhea in fall 2012, found to have ascites and carcinomatosis. Pathology was documented from the malignant ascites 01-16-2011, consistent with serous gyn primary. She required two large volume paracenteses in Dec. She received 3 cycles of neoadjuvant carboplatin/ taxol from 01-27-2011 thru 03-10-11, with gCSF support required. CA 125 was 239 in Dec 2012 and down to 4.9 by early February 2013. CT AP Jan 2013 showed resolution of ascites and significant improvement in omental disease. She went to TAH/BSO/omentectomy by Dr.ClarkePearson on 04-01-2011, with extensive adhesions noted and oozing of ascites fluid from all peritoneal surfaces during that surgery; pathology had no residual cancer apparent. She resumed taxol/carboplatin for an additional 3 cycles from 05-12-11 thru 07-01-2011 and has been on observation since then. Last CA 125 was   Review of systems as above, also: No  new or different pain. No cough or SOB now. No bleeding. No LE swelling. Some urinary incontinence, not new. Remainder of 10 point Review of Systems negative.  Objective:  Vital signs in last 24 hours:  BP 176/85 mmHg  Pulse 72  Temp(Src) 98.4 F (36.9 C) (Oral)  Resp 18  Ht _0  (1.6 m)  Wt 198 lb 9.6 oz (90.084 kg)  BMI 35.19 kg/m2  SpO2 99% Looks comfortable, talkative and pleasant Alert, oriented and appropriate. Ambulatory without assistance.  HEENT:PERRL, sclerae not icteric. Oral mucosa moist without lesions, posterior pharynx clear.  Neck supple. No JVD.  Lymphatics:no cervical,supraclavicular or inguinal adenopathy Resp: clear to auscultation bilaterally Cardio: regular rate and rhythm. No gallop. IR:WERXVQM obese, soft, nontender, no appreciable mass or organomegaly. Large ventral hernia, not tender.  Normally active bowel sounds. Musculoskeletal/ Extremities: without pitting edema, cords, tenderness Neuro: no peripheral neuropathy. Otherwise nonfocal. PSYCH appropriate mood and affect Skin without rash, ecchymosis, petechiae   Lab Results:  Results for orders placed or performed in visit on 03/02/14  CBC with Differential  Result Value Ref Range   WBC 5.0 3.9 - 10.3 10e3/uL   NEUT# 3.0 1.5 - 6.5 10e3/uL   HGB 12.2 11.6 - 15.9 g/dL   HCT 38.5 34.8 - 46.6 %   Platelets 200 145 - 400 10e3/uL   MCV 86.7 79.5 - 101.0 fL   MCH 27.6 25.1 - 34.0 pg   MCHC 31.8 31.5 - 36.0 g/dL   RBC 4.44 3.70 - 5.45 10e6/uL   RDW 15.2 (H) 11.2 - 14.5 %   lymph# 1.0 0.9 - 3.3 10e3/uL   MONO# 0.7  0.1 - 0.9 10e3/uL   Eosinophils Absolute 0.2 0.0 - 0.5 10e3/uL   Basophils Absolute 0.1 0.0 - 0.1 10e3/uL   NEUT% 60.5 38.4 - 76.8 %   LYMPH% 20.6 14.0 - 49.7 %   MONO% 13.6 0.0 - 14.0 %   EOS% 3.9 0.0 - 7.0 %   BASO% 1.4 0.0 - 2.0 %    CMET available after visit: Na 138, K 4.7, Cl 104, glu 99, BUN 33.5, creat 1.1, LFTs fine, T prot 7.2, alb 3.8 EGFR calculated 49  CA 125 available  after visit by new lab method (which has been higher in all patients) 9, and by old lab method 6.7, having been 7 by new method and 5.8 by old method in 11-2013  (239 at presentation).   Studies/Results:  No results found.  Medications: I have reviewed the patient's current medications.  DISCUSSION: we will let her know CA 125 results as above, appears stable. She will see Dr Alycia Rossetti in 3 months and I will see her again in 6 months, or sooner if needed. Discussed diet and exercise.  Assessment/Plan: 1.Serous gyn carcinoma: history as above, clinically continues to do well. She will see Dr Alycia Rossetti in 3 months with ca125 and will see me again in 6 months with cbc cmet ca125.  2.ventral hernia: has not wanted to have another surgery 3.hx HTN, GERD, and SIADH with prior surgeries  4.flu and shingles vaccines done  5.loss of husband last year 6.weight gain related to diet and less exercise   Time spent 15 min including >50% counseling and coordination of care. CC this note to Dr Rex Kras.   Camron Monday P, MD   03/02/2014, 10:43 AM

## 2014-03-02 NOTE — Telephone Encounter (Signed)
per pof to sch pt appt-sch Gehrig appt-adv pt LL appt not open for 6 mths-adv we wqill call once opened

## 2014-03-03 ENCOUNTER — Telehealth: Payer: Self-pay | Admitting: *Deleted

## 2014-03-03 LAB — CA 125: CA 125: 9 U/mL (ref <35)

## 2014-03-03 LAB — CA 125(PREVIOUS METHOD): CA 125: 6.7 U/mL (ref 0.0–30.2)

## 2014-03-03 NOTE — Telephone Encounter (Signed)
Called patient again and reached her - let her know results of CA125 drawn yesterday. Pt appreciative of call. Agreeable to followup as scheduled in April with Dr. Alycia Rossetti.

## 2014-03-03 NOTE — Telephone Encounter (Signed)
Called and left VM for patient to return call.

## 2014-03-03 NOTE — Telephone Encounter (Signed)
-----   Message from Gordy Levan, MD sent at 03/02/2014 11:04 AM EST ----- Please let her know ca125 when available - if elevated ask me before you call her, but fine to call if not elevated

## 2014-03-21 ENCOUNTER — Ambulatory Visit (INDEPENDENT_AMBULATORY_CARE_PROVIDER_SITE_OTHER): Payer: Medicare Other | Admitting: Podiatry

## 2014-03-21 ENCOUNTER — Ambulatory Visit (INDEPENDENT_AMBULATORY_CARE_PROVIDER_SITE_OTHER): Payer: Medicare Other

## 2014-03-21 VITALS — BP 119/69 | HR 86 | Resp 16 | Ht 63.0 in | Wt 185.0 lb

## 2014-03-21 DIAGNOSIS — M779 Enthesopathy, unspecified: Secondary | ICD-10-CM | POA: Diagnosis not present

## 2014-03-21 DIAGNOSIS — M19071 Primary osteoarthritis, right ankle and foot: Secondary | ICD-10-CM

## 2014-03-21 DIAGNOSIS — Q6651 Congenital pes planus, right foot: Secondary | ICD-10-CM

## 2014-03-21 DIAGNOSIS — M7751 Other enthesopathy of right foot: Secondary | ICD-10-CM | POA: Diagnosis not present

## 2014-03-21 MED ORDER — CYCLOBENZAPRINE HCL 5 MG PO TABS: 5.0000 mg | ORAL_TABLET | Freq: Three times a day (TID) | ORAL | Status: DC | PRN

## 2014-03-21 NOTE — Progress Notes (Signed)
She presents today with a chief complaint of right foot pain.  Objective: Vital signs are stable she is alert and oriented 3 pulses are palpable bilateral. She has pain on palpation of Lisfranc's joints with thickening of Lisfranc's joints. Radiographic evaluation, her small to moderate osteopenia with osteoarthritis of the midfoot.  Assessment: Osteoarthritis mid foot. Right.  Plan: Injected today with dexamethasone analogue and local anesthetic. This should alleviate her symptoms at this point I discussed appropriate shoe gear stretching exercises as ice therapy with her.

## 2014-03-22 DIAGNOSIS — H34832 Tributary (branch) retinal vein occlusion, left eye: Secondary | ICD-10-CM | POA: Diagnosis not present

## 2014-03-22 DIAGNOSIS — H35352 Cystoid macular degeneration, left eye: Secondary | ICD-10-CM | POA: Diagnosis not present

## 2014-03-31 ENCOUNTER — Telehealth: Payer: Self-pay | Admitting: Oncology

## 2014-03-31 NOTE — Telephone Encounter (Signed)
, °

## 2014-04-27 DIAGNOSIS — H2513 Age-related nuclear cataract, bilateral: Secondary | ICD-10-CM | POA: Diagnosis not present

## 2014-04-27 DIAGNOSIS — H35032 Hypertensive retinopathy, left eye: Secondary | ICD-10-CM | POA: Diagnosis not present

## 2014-04-27 DIAGNOSIS — H34832 Tributary (branch) retinal vein occlusion, left eye: Secondary | ICD-10-CM | POA: Diagnosis not present

## 2014-05-10 DIAGNOSIS — H2513 Age-related nuclear cataract, bilateral: Secondary | ICD-10-CM | POA: Diagnosis not present

## 2014-05-10 DIAGNOSIS — H34832 Tributary (branch) retinal vein occlusion, left eye: Secondary | ICD-10-CM | POA: Diagnosis not present

## 2014-05-10 DIAGNOSIS — H01021 Squamous blepharitis right upper eyelid: Secondary | ICD-10-CM | POA: Diagnosis not present

## 2014-05-10 DIAGNOSIS — H01024 Squamous blepharitis left upper eyelid: Secondary | ICD-10-CM | POA: Diagnosis not present

## 2014-05-10 DIAGNOSIS — H1851 Endothelial corneal dystrophy: Secondary | ICD-10-CM | POA: Diagnosis not present

## 2014-05-10 DIAGNOSIS — H40013 Open angle with borderline findings, low risk, bilateral: Secondary | ICD-10-CM | POA: Diagnosis not present

## 2014-05-10 DIAGNOSIS — H04123 Dry eye syndrome of bilateral lacrimal glands: Secondary | ICD-10-CM | POA: Diagnosis not present

## 2014-05-10 DIAGNOSIS — H01022 Squamous blepharitis right lower eyelid: Secondary | ICD-10-CM | POA: Diagnosis not present

## 2014-05-10 DIAGNOSIS — H01025 Squamous blepharitis left lower eyelid: Secondary | ICD-10-CM | POA: Diagnosis not present

## 2014-06-08 ENCOUNTER — Encounter: Payer: Self-pay | Admitting: Gynecologic Oncology

## 2014-06-08 ENCOUNTER — Other Ambulatory Visit: Payer: Medicare Other

## 2014-06-08 ENCOUNTER — Other Ambulatory Visit: Payer: Self-pay | Admitting: *Deleted

## 2014-06-08 ENCOUNTER — Ambulatory Visit: Payer: Medicare Other | Attending: Gynecologic Oncology | Admitting: Gynecologic Oncology

## 2014-06-08 VITALS — BP 157/82 | HR 75 | Temp 98.4°F | Resp 18 | Ht 63.0 in | Wt 199.0 lb

## 2014-06-08 DIAGNOSIS — K432 Incisional hernia without obstruction or gangrene: Secondary | ICD-10-CM

## 2014-06-08 DIAGNOSIS — Z9221 Personal history of antineoplastic chemotherapy: Secondary | ICD-10-CM

## 2014-06-08 DIAGNOSIS — Z8543 Personal history of malignant neoplasm of ovary: Secondary | ICD-10-CM | POA: Diagnosis not present

## 2014-06-08 DIAGNOSIS — C569 Malignant neoplasm of unspecified ovary: Secondary | ICD-10-CM | POA: Insufficient documentation

## 2014-06-08 DIAGNOSIS — N898 Other specified noninflammatory disorders of vagina: Secondary | ICD-10-CM

## 2014-06-08 MED ORDER — NYSTATIN-TRIAMCINOLONE 100000-0.1 UNIT/GM-% EX OINT: TOPICAL_OINTMENT | Freq: Two times a day (BID) | CUTANEOUS | Status: DC | PRN

## 2014-06-08 NOTE — Progress Notes (Signed)
Consult Note: Gyn-Onc  [name] 81 y.o. female  CC:  Chief Complaint  Patient presents with  . Ovarian Cancer    HPI: Patient is a 81 year old with stage IIIc primary peritoneal or ovarian carcinoma. Her illness began with several weeks of diarrhea and abdominal bloating the fall of 2012. She was tested several times for C. difficile was negative. Dr. Ronita Hipps at that time recommended an ultrasound and ultimately a CT scan. Ultrasound showed significant ascites and her CA 125 was markedly elevated which is when we saw her. CT imaging was most consistent with markedly advanced disease that would most likely preclude optimal debulking. This in conjunction with her poor performance status the decision was made to proceed with neoadjuvant chemotherapy. She had a high-volume paracentesis to confirm the diagnosis of a gynecologic malignancy.   She had 3 cycles of neoadjuvant chemotherapy under the care of Dr. Marko Plume with paclitaxel and carboplatin. Her CA 125 prior cycle #1 was 204 and on February 28, 2011 prior to cycle #2 her CA 125 was 12.6. She also had a CT scan that showed marked resolution of her ascites and marked improvement in the omental caking. There is no mention of any significant carcinomatosis. There is no significant adenopathy. The adnexa and uterus are within normal limits.  After discussion of pros and cons she opted for definitive surgical management. On 04/01/2011 she underwent surgery by Dr. Fermin Schwab. She underwent exploratory laparotomy TAH/BSO and omental resection. There was no gross evidence of disease. Pathology revealed no residual carcinoma within the omentum. There was stromal fibrosis and chronic inflammation and scattered psammoma bodies consistent with posttreatment effect. Within the right ovary and tube was no residual carcinoma. There was benign ovarian tissue with associated serosal adhesions necrosis and scattered psammoma bodies. This was seen on the  uterine serosa. The uterus was otherwise negative. The left tube and ovary also had associated psammoma bodies but no residual carcinoma identified. She did well postoperatively and after lengthy discussions underwent an additional 3 cycles of chemotherapy which she completed in 06-20-11. CA-125 at that time was 4.2 and on 07/23/11 it was 3.3. Subsequent CA-125's have been less than 10 with her last one being 9 on 03/02/14. She has one pending from today.  The most notable history is that in August 2014 her husband fell while playing ping-pong and hit his head. He had a large intracerebral bleed and it is unclear whether he had a stroke, an aneurysm rupture or related to trauma. They decided to withdraw. They have been married normal 60 years. Her husband was about to turn 38. This is been very stressful for her.  Interval History: I last saw her in 10/15 and Dr. Marko Plume saw her in 1/16. She's overall doing fairly well. Her weight is up 6# since I saw her last. She is quite bothered by her weight is well as she believes that hernia is getting a little bit bigger. The biggest issue she is having his eating too much ice cream and getting off of her exercise routine. She did see a surgeon who told her that she was "sitting on a time bomb" or at least at how she remembers it. She is very worried about having surgery she had a bad experience with a staph infection when she had her knee replacement done. She has not been using an abdominal binder as we have discussed at her last visit. The hernia is not causing her any pain or other symptoms she does find  that it is unsightly but she states is difficult to know whether that hernia is getting bigger or if it's due to her weight that her abdomen is more protuberant. In the last year and a half she's gained about 26 pounds.    Review of Systems:  Constitutional: Denies fever. + weight gain Skin: No rash, + itching under pannus and on vulva Cardiovascular: No chest  pain, shortness of breath, or edema  Pulmonary: No cough Gastro Intestinal:No nausea, vomiting, constipation, or diarrhea reported. No bright red blood per rectum or change in bowel movement. + hernia larger, no pain Genitourinary: No frequency, + urgency in the morning only she does not wake up at night to void, or dysuria.  Denies vaginal bleeding and discharge.  Musculoskeletal: No myalgia, arthralgia, joint swelling or pain. Neurologic: No weakness Psychology: Feeling well and staying busy.  Current Meds:  Outpatient Encounter Prescriptions as of 06/08/2014  Medication Sig  . azelastine (ASTELIN) 137 MCG/SPRAY nasal spray Place 1 spray into the nose as needed. Use in each nostril as directed  . Calcium Citrate-Vitamin D (CITRACAL + D PO) Take 600 mg by mouth 2 (two) times daily.   . Cholecalciferol (VITAMIN D) 2000 UNITS tablet Take 2,000 Units by mouth daily.   Marland Kitchen ibuprofen (ADVIL,MOTRIN) 200 MG tablet Take 200 mg by mouth every 6 (six) hours as needed. pain  . lisinopril (PRINIVIL,ZESTRIL) 10 MG tablet Take 10 mg by mouth daily after breakfast.   . Multiple Vitamins-Minerals (CENTRUM SILVER PO) Take 1 tablet by mouth daily.   . nadolol (CORGARD) 20 MG tablet Take 10 mg by mouth daily after breakfast.   . nystatin cream (MYCOSTATIN) Apply 1 application topically 3 (three) times daily as needed. Applies to Outside of vagina and anus for Itching  . nystatin-triamcinolone ointment (MYCOLOG) Apply topically 2 (two) times daily as needed.  Marland Kitchen omega-3 acid ethyl esters (LOVAZA) 1 G capsule Take 1 g by mouth daily.  Marland Kitchen omeprazole (PRILOSEC) 20 MG capsule Take 20 mg by mouth Daily.  Marland Kitchen PATADAY 0.2 % SOLN as needed.   Vladimir Faster Glycol-Propyl Glycol (SYSTANE) 0.4-0.3 % SOLN Place 1 drop into both eyes 4 (four) times daily.  . Ranibizumab (LUCENTIS IO) Place into the left eye once as needed. Pt receive from dr. Zadie Rhine office [phone]  . Red Yeast Rice Extract (RED YEAST RICE PO) Take 1 capsule  by mouth 2 (two) times daily.  . vitamin E (VITAMIN E) 400 UNIT capsule Take 400 Units by mouth daily.  Marland Kitchen zolpidem (AMBIEN) 10 MG tablet Take 10 mg by mouth at bedtime.   . [DISCONTINUED] cyclobenzaprine (FLEXERIL) 5 MG tablet Take 1 tablet (5 mg total) by mouth 3 (three) times daily as needed for muscle spasms.    Allergy:  Allergies  Allergen Reactions  . Morphine And Related     Given after knee replacement and code blue occurred  . Penicillins     REACTION: hives  . Shellfish Allergy     Pt has shellfish allergy only.  Has had IV contrast x 2 and did fine.    Social Hx:   History   Social History  . Marital Status: Widowed    Spouse Name: N/A  . Number of Children: N/A  . Years of Education: N/A   Occupational History  . Not on file.   Social History Main Topics  . Smoking status: Former Smoker    Quit date: 02/11/1952  . Smokeless tobacco: Never Used  . Alcohol Use:  No  . Drug Use: No  . Sexual Activity: No   Other Topics Concern  . Not on file   Social History Narrative    Past Surgical Hx:  Past Surgical History  Procedure Laterality Date  . Tonsillectomy    . Appendectomy    . Joint replacement      R knee in 2008, 5 operations on L knee  . Other surgical history      hx of C section 1966  . Laparotomy  04/01/2011    Procedure: EXPLORATORY LAPAROTOMY;  Surgeon: Alvino Chapel, MD;  Location: [facility];  Service: Gynecology;  Laterality: N/A;  . Abdominal hysterectomy  04/01/2011    Procedure: HYSTERECTOMY ABDOMINAL;  Surgeon: Alvino Chapel, MD;  Location: [facility];  Service: Gynecology;  Laterality: N/A;  . Salpingoophorectomy  04/01/2011    Procedure: SALPINGO OOPHERECTOMY;  Surgeon: Alvino Chapel, MD;  Location: [facility];  Service: Gynecology;  Laterality: Bilateral;    Past Medical Hx:  Past Medical History  Diagnosis Date  . Ascites   . Cystitis   . GERD (gastroesophageal reflux disease)   . Hypertension   .  Hyponatremia   . Hyperlipidemia   . Osteopenia   . Postmenopausal atrophic vaginitis   . Vulvitis   . Staph infection 2010    after knee replacement  . Urinary frequency   . Complication of anesthesia     Sodium drops per pt   . Pneumonia     hx of   . Anemia     hx of   . Blood transfusion     hx of 2011   . H/O hiatal hernia   . Arthritis     knees   . Ovarian cancer 01/25/2011  . DIARRHEA, ANTIBIOTIC ASSOCIATED 05/31/2009    Qualifier: Diagnosis of  By: Tommy Medal MD, Roderic Scarce    . ARTHRITIS, SEPTIC 05/28/2009    Qualifier: Diagnosis of  By: Tommy Medal MD, Roderic Scarce    . Anemia associated with acute blood loss 04/03/2011  . OSTEOMYELITIS, CHRONIC, LOWER LEG 04/23/2009    Qualifier: Diagnosis of  By: Johnnye Sima MD, Dellis Filbert    . OSTEOPOROSIS 04/23/2009    Qualifier: Diagnosis of  By: Johnnye Sima MD, Dellis Filbert    . PROSTHETIC JOINT COMPLICATION 0/10/3816    Qualifier: Diagnosis of  By: Tommy Medal MD, Roderic Scarce    . Neutropenia with fever 05/18/2011    Oncology Hx:    Ovarian cancer   01/25/2011 Initial Diagnosis Ovarian cancer   01/27/2011 - 06/23/2011 Chemotherapy 6 cycles paclitaxel and carboplatin   04/01/2011 Surgery optimal debulking post three cycles of neoadjuvant chemotherapy followed by three cycles of chemotherapy for a total of 6    Family Hx:  Family History  Problem Relation Age of Onset  . Heart attack Brother   . Diabetes Brother   . Cancer Other     Bladder cancer    Vitals:  Blood pressure 157/82, pulse 75, temperature 98.4 F (36.9 C), temperature source Oral, resp. rate 18, height 5\' 3"  (1.6 m), weight 199 lb (90.266 kg).  Physical Exam:  Well-nourished well-developed female in no acute distress.   Neck: Supple, no lymphadenopathy, no thyromegaly.   Lungs: Clear to auscultation bilaterally.   Cardiovascular: Regular rate and rhythm.   Abdomen: Well-healed vertical incision. She has a reducible, non tender incisional hernia towards the left side of her incision  and a palpable 7 cm opening in the fascia is appreciated. Easily reducible. Abdomen is  soft, nontender, nondistended.  Groins: No lymphadenopathy.   Extremities: No edema.   Pelvic: Atrophic external genitalia. + erythema c/w candida. There were no palpable masses on bimanual examination. There is no nodularity. Rectal confirms.   Assessment/Plan:  81 year old diagnosed with advanced serous either primary peritoneal or ovarian carcinoma in December of 2012. Based on CT findings she was treated with neoadjuvant chemotherapy with carboplatin and paclitaxel from 01/27/2011 through March 10 2011. Her CA125 responded nicely as did her imaging. She had an optimal cytoreductive surgery by Dr. Fermin Schwab. Final pathology revealed no evidence of disease. In an additional 3 cycles of chemotherapy after her debulking surgery were given and she has had no evidence of disease since that time. Her clinical exam today is unremarkable. Her CA-125 from today is pending.  She will followup with Dr. Marko Plume in 4 months and return to see [place] to 4 months after that.   We again discussed the hernia at some length today at this point she was to follow her expectantly. I placed a referral for nutritionist to help her with weight loss efforts. We'll follow-up on the results of her CA-125 from today.   Kya Mayfield A., MD 06/08/2014, 9:35 AM

## 2014-06-08 NOTE — Patient Instructions (Signed)
We will notify you of your CA-125 results from today. Follow up with Dr. Marko Plume in 4 months and Gyn Oncology in 8 months.

## 2014-06-09 ENCOUNTER — Telehealth: Payer: Self-pay | Admitting: *Deleted

## 2014-06-09 LAB — CA 125: CA 125: 8 U/mL (ref <35)

## 2014-06-09 NOTE — Telephone Encounter (Signed)
Called pt with CA 125 results. Pt verbalized understanding. No further concerns.

## 2014-06-09 NOTE — Addendum Note (Signed)
Addended by: Lucile Crater on: 06/09/2014 03:21 PM   Modules accepted: Orders

## 2014-06-29 ENCOUNTER — Encounter: Payer: Self-pay | Admitting: Podiatry

## 2014-06-30 DIAGNOSIS — H01025 Squamous blepharitis left lower eyelid: Secondary | ICD-10-CM | POA: Diagnosis not present

## 2014-06-30 DIAGNOSIS — H01021 Squamous blepharitis right upper eyelid: Secondary | ICD-10-CM | POA: Diagnosis not present

## 2014-06-30 DIAGNOSIS — H01024 Squamous blepharitis left upper eyelid: Secondary | ICD-10-CM | POA: Diagnosis not present

## 2014-06-30 DIAGNOSIS — H2513 Age-related nuclear cataract, bilateral: Secondary | ICD-10-CM | POA: Diagnosis not present

## 2014-06-30 DIAGNOSIS — H01022 Squamous blepharitis right lower eyelid: Secondary | ICD-10-CM | POA: Diagnosis not present

## 2014-06-30 DIAGNOSIS — H1851 Endothelial corneal dystrophy: Secondary | ICD-10-CM | POA: Diagnosis not present

## 2014-08-31 ENCOUNTER — Other Ambulatory Visit: Payer: Medicare Other

## 2014-08-31 ENCOUNTER — Ambulatory Visit: Payer: Medicare Other | Admitting: Oncology

## 2014-09-12 ENCOUNTER — Telehealth: Payer: Self-pay | Admitting: Oncology

## 2014-09-12 NOTE — Telephone Encounter (Signed)
Called patient and she is aware of her new appointment time (per dr Marko Plume for a chemo patient)

## 2014-09-28 ENCOUNTER — Other Ambulatory Visit: Payer: Medicare Other

## 2014-09-28 ENCOUNTER — Ambulatory Visit: Payer: Medicare Other | Admitting: Oncology

## 2014-10-04 DIAGNOSIS — K219 Gastro-esophageal reflux disease without esophagitis: Secondary | ICD-10-CM | POA: Diagnosis not present

## 2014-10-04 DIAGNOSIS — R8290 Unspecified abnormal findings in urine: Secondary | ICD-10-CM | POA: Diagnosis not present

## 2014-10-04 DIAGNOSIS — M899 Disorder of bone, unspecified: Secondary | ICD-10-CM | POA: Diagnosis not present

## 2014-10-04 DIAGNOSIS — Z23 Encounter for immunization: Secondary | ICD-10-CM | POA: Diagnosis not present

## 2014-10-04 DIAGNOSIS — J309 Allergic rhinitis, unspecified: Secondary | ICD-10-CM | POA: Diagnosis not present

## 2014-10-04 DIAGNOSIS — E782 Mixed hyperlipidemia: Secondary | ICD-10-CM | POA: Diagnosis not present

## 2014-10-04 DIAGNOSIS — I10 Essential (primary) hypertension: Secondary | ICD-10-CM | POA: Diagnosis not present

## 2014-10-04 DIAGNOSIS — M8580 Other specified disorders of bone density and structure, unspecified site: Secondary | ICD-10-CM | POA: Diagnosis not present

## 2014-10-04 DIAGNOSIS — Z8543 Personal history of malignant neoplasm of ovary: Secondary | ICD-10-CM | POA: Diagnosis not present

## 2014-10-04 DIAGNOSIS — Z0000 Encounter for general adult medical examination without abnormal findings: Secondary | ICD-10-CM | POA: Diagnosis not present

## 2014-10-04 DIAGNOSIS — G4700 Insomnia, unspecified: Secondary | ICD-10-CM | POA: Diagnosis not present

## 2014-10-06 ENCOUNTER — Other Ambulatory Visit: Payer: Self-pay

## 2014-10-08 ENCOUNTER — Other Ambulatory Visit: Payer: Self-pay | Admitting: Oncology

## 2014-10-09 ENCOUNTER — Telehealth: Payer: Self-pay | Admitting: Oncology

## 2014-10-09 NOTE — Telephone Encounter (Signed)
Called patient and rescheduled her follow up per pof  anne

## 2014-10-12 ENCOUNTER — Ambulatory Visit: Payer: Medicare Other | Admitting: Oncology

## 2014-10-12 ENCOUNTER — Other Ambulatory Visit: Payer: Medicare Other

## 2014-10-26 ENCOUNTER — Encounter: Payer: Self-pay | Admitting: Podiatry

## 2014-10-26 ENCOUNTER — Ambulatory Visit (INDEPENDENT_AMBULATORY_CARE_PROVIDER_SITE_OTHER): Payer: Medicare Other | Admitting: Podiatry

## 2014-10-26 VITALS — BP 151/84 | HR 70 | Resp 16

## 2014-10-26 DIAGNOSIS — M779 Enthesopathy, unspecified: Secondary | ICD-10-CM | POA: Diagnosis not present

## 2014-10-26 DIAGNOSIS — Q6651 Congenital pes planus, right foot: Secondary | ICD-10-CM

## 2014-10-26 DIAGNOSIS — M19071 Primary osteoarthritis, right ankle and foot: Secondary | ICD-10-CM

## 2014-10-26 DIAGNOSIS — M722 Plantar fascial fibromatosis: Secondary | ICD-10-CM | POA: Diagnosis not present

## 2014-10-26 NOTE — Progress Notes (Signed)
She presents today requesting another injection across the dorsal aspect of her right foot. She states that this is starting to hurt again since she has been ordered to no longer take ibuprofen or non-steroidal's. She states that because of kidney issues she can no longer have ibuprofen on a regular basis. She states that her orthotics which are 66-79 years old really seem not to be very supportive and she is concerned that she is not getting the support she needs with her orthotics in her shoes. She would like for me to take a look at her orthotics but her foot and make sure that there is nothing else that I see that we can do for it.  Objective: Vital signs are stable she is alert and oriented 3. Pulses are strongly palpable. She has moderate to severe pes planus with severe osteoarthritis demonstrated on radiographs. She has pain on palpation the dorsal aspect of the right foot. Pes planus is noted with mild plantar fasciitis.  Assessment: Pain in limb secondary to osteoarthritis and pes planus and plantar fasciitis.  Plan: She was scanned for a new set of orthotics today and I injected across the dorsal aspect of the right foot with Kenalog and local and aesthetic. I encouraged her to no longer take anti-inflammatory's but I did suggest different shoes as well as arthritis strength Tylenol. I will follow up with her once her orthotics come in.

## 2014-10-28 ENCOUNTER — Other Ambulatory Visit: Payer: Self-pay | Admitting: Oncology

## 2014-10-30 ENCOUNTER — Ambulatory Visit (HOSPITAL_BASED_OUTPATIENT_CLINIC_OR_DEPARTMENT_OTHER): Payer: Medicare Other | Admitting: Oncology

## 2014-10-30 ENCOUNTER — Encounter: Payer: Self-pay | Admitting: Oncology

## 2014-10-30 ENCOUNTER — Other Ambulatory Visit (HOSPITAL_BASED_OUTPATIENT_CLINIC_OR_DEPARTMENT_OTHER): Payer: Medicare Other

## 2014-10-30 ENCOUNTER — Telehealth: Payer: Self-pay | Admitting: Oncology

## 2014-10-30 VITALS — BP 168/72 | HR 74 | Temp 99.1°F | Resp 18 | Ht 63.0 in | Wt 194.5 lb

## 2014-10-30 DIAGNOSIS — E669 Obesity, unspecified: Secondary | ICD-10-CM

## 2014-10-30 DIAGNOSIS — C786 Secondary malignant neoplasm of retroperitoneum and peritoneum: Secondary | ICD-10-CM | POA: Diagnosis not present

## 2014-10-30 DIAGNOSIS — C569 Malignant neoplasm of unspecified ovary: Secondary | ICD-10-CM

## 2014-10-30 DIAGNOSIS — N183 Chronic kidney disease, stage 3 unspecified: Secondary | ICD-10-CM

## 2014-10-30 DIAGNOSIS — K432 Incisional hernia without obstruction or gangrene: Secondary | ICD-10-CM

## 2014-10-30 DIAGNOSIS — C482 Malignant neoplasm of peritoneum, unspecified: Secondary | ICD-10-CM

## 2014-10-30 LAB — COMPREHENSIVE METABOLIC PANEL (CC13)
ALT: 16 U/L (ref 0–55)
AST: 19 U/L (ref 5–34)
Albumin: 3.8 g/dL (ref 3.5–5.0)
Alkaline Phosphatase: 48 U/L (ref 40–150)
Anion Gap: 8 mEq/L (ref 3–11)
BUN: 28 mg/dL — ABNORMAL HIGH (ref 7.0–26.0)
CO2: 26 mEq/L (ref 22–29)
Calcium: 9.8 mg/dL (ref 8.4–10.4)
Chloride: 104 mEq/L (ref 98–109)
Creatinine: 1.2 mg/dL — ABNORMAL HIGH (ref 0.6–1.1)
EGFR: 43 mL/min/{1.73_m2} — ABNORMAL LOW (ref >90)
Glucose: 92 mg/dl (ref 70–140)
Potassium: 5.1 mEq/L (ref 3.5–5.1)
Sodium: 138 mEq/L (ref 136–145)
Total Bilirubin: 0.41 mg/dL (ref 0.20–1.20)
Total Protein: 7.3 g/dL (ref 6.4–8.3)

## 2014-10-30 LAB — CBC WITH DIFFERENTIAL/PLATELET
BASO%: 1.1 % (ref 0.0–2.0)
Basophils Absolute: 0.1 10*3/uL (ref 0.0–0.1)
EOS%: 4.8 % (ref 0.0–7.0)
Eosinophils Absolute: 0.3 10*3/uL (ref 0.0–0.5)
HCT: 38.6 % (ref 34.8–46.6)
HGB: 12.8 g/dL (ref 11.6–15.9)
LYMPH%: 17.8 % (ref 14.0–49.7)
MCH: 28.7 pg (ref 25.1–34.0)
MCHC: 33.2 g/dL (ref 31.5–36.0)
MCV: 86.4 fL (ref 79.5–101.0)
MONO#: 0.7 10*3/uL (ref 0.1–0.9)
MONO%: 11.6 % (ref 0.0–14.0)
NEUT#: 3.8 10*3/uL (ref 1.5–6.5)
NEUT%: 64.7 % (ref 38.4–76.8)
Platelets: 202 10*3/uL (ref 145–400)
RBC: 4.46 10*6/uL (ref 3.70–5.45)
RDW: 14.8 % — ABNORMAL HIGH (ref 11.2–14.5)
WBC: 5.8 10*3/uL (ref 3.9–10.3)
lymph#: 1 10*3/uL (ref 0.9–3.3)

## 2014-10-30 NOTE — Progress Notes (Signed)
OFFICE PROGRESS NOTE   October 30, 2014   Physicians:P.Gehrig, R.Taavon, K.Little, J.Hayes, G.Rankin, S.Gross  INTERVAL HISTORY:  Patient is seen, alone for visit, in scheduled follow up of IIIC serous primary peritoneal carcinoma, on observation since completing chemotherapy in 06-2011. She saw Dr Alycia Rossetti in April and will see her again in Dec. Last imaging was CT AP 01-2012. Last CA 125 was 9 by new lab methodology in 02-2014 and 8 by same method in 05-2014.  She had physical exam by PCP 3 weeks ago. Dr Ronita Hipps has recently given oral medication for symptoms of vaginal yeast.  Patient has felt generally well. She has lost a little weight with diet changes (stopped eating potatoes and rice, "ice cream is next"). The large ventral hernia is not painful and she is comfortable with recommendations from Drs Alycia Rossetti and Little not to do surgical repair. Bowels are moving regularly, no abdominal or pelvic pain, no different bladder symptoms.  She has recent oral medication from Dr Ronita Hipps for vaginal irritation, possibly diflucan, improved but not quite resolved and she will follow up with him.  CA 125 preop 239 No PAC Flu vaccine done late Aug by Dr Caro Hight done  She enjoyed attending daughter's wedding recently.  ONCOLOGIC HISTORY  Patient presented with several weeks of bloating and diarrhea in fall 2012, found to have ascites and carcinomatosis. Pathology was documented from the malignant ascites 01-16-2011, consistent with serous gyn primary. She required two large volume paracenteses in Dec. She received 3 cycles of neoadjuvant carboplatin/ taxol from 01-27-2011 thru 03-10-11, with gCSF support required. CA 125 was 239 in Dec 2012 and down to 4.9 by early February 2013. CT AP Jan 2013 showed resolution of ascites and significant improvement in omental disease. She went to TAH/BSO/omentectomy by Dr.ClarkePearson on 04-01-2011, with extensive adhesions noted and oozing of ascites fluid from all  peritoneal surfaces during that surgery; pathology had no residual cancer apparent. She resumed taxol/carboplatin for an additional 3 cycles from 05-12-11 thru 07-01-2011 and has been on observation since then. CA 125 05-2014 was 8.   Review of systems as above, also: No fever or symptoms of infection. BP higher today than at last check, ate popcorn with salt at movie. No SOB, no LE swelling. Patient understands from other MDs that she "needs to drink more water"  Remainder of 10 point Review of Systems negative.  Objective:  Vital signs in last 24 hours:  BP 168/72 mmHg  Pulse 74  Temp(Src) 99.1 F (37.3 C) (Oral)  Resp 18  Ht '5\' 3"'  (1.6 m)  Wt 194 lb 8 oz (88.225 kg)  BMI 34.46 kg/m2  SpO2 99% Weight down 4 lbs from 02-2014. Alert, oriented and appropriate. Ambulatory with cane. Looks comfortable, very pleasant as always. Respirations not labored RA.  No alopecia  HEENT:PERRL, sclerae not icteric. Oral mucosa moist without lesions, posterior pharynx clear.   No JVD.  Lymphatics:no cervical,supraclavicular, axillary adenopathy Resp: clear to auscultation bilaterally and normal percussion bilaterally Cardio: regular rate and rhythm. No gallop. GI: abdomen obese with large ventral hernia to left of midline, soft, nontender, no appreciable mass or organomegaly. Normally active bowel sounds.  Musculoskeletal/ Extremities: without pitting edema, cords, tenderness Neuro: no peripheral neuropathy. Otherwise nonfocal. PSYCH appropriate mood and affect Skin without rash, ecchymosis, petechiae Breasts: bilaterally without dominant mass, skin or nipple findings. Axillae benign.   Lab Results:  Results for orders placed or performed in visit on 10/30/14  CBC with Differential  Result Value Ref Range  WBC 5.8 3.9 - 10.3 10e3/uL   NEUT# 3.8 1.5 - 6.5 10e3/uL   HGB 12.8 11.6 - 15.9 g/dL   HCT 38.6 34.8 - 46.6 %   Platelets 202 145 - 400 10e3/uL   MCV 86.4 79.5 - 101.0 fL   MCH 28.7 25.1  - 34.0 pg   MCHC 33.2 31.5 - 36.0 g/dL   RBC 4.46 3.70 - 5.45 10e6/uL   RDW 14.8 (H) 11.2 - 14.5 %   lymph# 1.0 0.9 - 3.3 10e3/uL   MONO# 0.7 0.1 - 0.9 10e3/uL   Eosinophils Absolute 0.3 0.0 - 0.5 10e3/uL   Basophils Absolute 0.1 0.0 - 0.1 10e3/uL   NEUT% 64.7 38.4 - 76.8 %   LYMPH% 17.8 14.0 - 49.7 %   MONO% 11.6 0.0 - 14.0 %   EOS% 4.8 0.0 - 7.0 %   BASO% 1.1 0.0 - 2.0 %    CMET available after visit normal with exception of BUN 28 and creatinine 1.2, with EGFR 43  CA125 available after visit  6  Studies/Results:  No new imaging  Medications: I have reviewed the patient's current medications.  DISCUSSION: clinically continues to do well now out over 3 years from completion of adjuvant chemotherapy for IIIC serous primary peritoneal carcinoma. She will see Dr Alycia Rossetti in Dec with CA 125 and I will see her with labs in June  Assessment/Plan: 1.Serous gyn carcinoma: history as above, clinically continues to do well. She will see Dr Alycia Rossetti in 3 months with ca125 and will see me again in 6 months from that visit with cbc cmet ca125.  2.ventral hernia: has not wanted to have another surgery 3.hx HTN, GERD, and SIADH with prior surgeries  4.flu vaccine done 09-2014 5.chronic kidney disease stage III: encourage good hydration. Cc labs PCP 7.obesity, BMI 34. Intentional weight loss of 4 lbs with dietary changes.     All questions answered. Time spent 20 min including >50% counseling and coordination of care. Cc note + labs to Drs Little and Tonita Cong, MD   10/30/2014, 9:06 AM

## 2014-10-30 NOTE — Telephone Encounter (Signed)
Appointments made and avs printed for patient,pof printed and held for June 2017

## 2014-10-31 ENCOUNTER — Telehealth: Payer: Self-pay

## 2014-10-31 DIAGNOSIS — N183 Chronic kidney disease, stage 3 unspecified: Secondary | ICD-10-CM | POA: Insufficient documentation

## 2014-10-31 DIAGNOSIS — K432 Incisional hernia without obstruction or gangrene: Secondary | ICD-10-CM | POA: Insufficient documentation

## 2014-10-31 DIAGNOSIS — C482 Malignant neoplasm of peritoneum, unspecified: Secondary | ICD-10-CM | POA: Insufficient documentation

## 2014-10-31 DIAGNOSIS — E669 Obesity, unspecified: Secondary | ICD-10-CM | POA: Insufficient documentation

## 2014-10-31 LAB — CA 125: CA 125: 6 U/mL (ref <35)

## 2014-10-31 NOTE — Telephone Encounter (Signed)
-----   Message from Gordy Levan, MD sent at 10/31/2014  9:16 AM EDT ----- Labs seen and need follow up: please let her know CA 125 is still in good low range at 6. She needs to be sure to stay well hydrated with noncaffeinated beverages

## 2014-10-31 NOTE — Telephone Encounter (Signed)
LM in [name]'s voice mail stating the results of her labs from 10-30-14 visit as noted below by Dr. Marko Plume. She can call the [facility] at [phone] if she has any questions.

## 2014-11-02 DIAGNOSIS — H35032 Hypertensive retinopathy, left eye: Secondary | ICD-10-CM | POA: Diagnosis not present

## 2014-11-02 DIAGNOSIS — H35352 Cystoid macular degeneration, left eye: Secondary | ICD-10-CM | POA: Diagnosis not present

## 2014-11-02 DIAGNOSIS — H34832 Tributary (branch) retinal vein occlusion, left eye: Secondary | ICD-10-CM | POA: Diagnosis not present

## 2014-11-02 DIAGNOSIS — H2513 Age-related nuclear cataract, bilateral: Secondary | ICD-10-CM | POA: Diagnosis not present

## 2014-11-16 ENCOUNTER — Ambulatory Visit: Payer: Medicare Other | Admitting: *Deleted

## 2014-11-16 DIAGNOSIS — M722 Plantar fascial fibromatosis: Secondary | ICD-10-CM

## 2014-11-16 NOTE — Progress Notes (Signed)
Patient ID: [name], female   DOB: 05-15-1929, 81 y.o.   MRN: [id number] Patient presents for orthotic pick up.  Verbal and written break in and wear instructions given.  Patient will follow up in 4 weeks if symptoms worsen or fail to improve.

## 2014-11-16 NOTE — Patient Instructions (Signed)

## 2014-11-20 DIAGNOSIS — M545 Low back pain: Secondary | ICD-10-CM | POA: Diagnosis not present

## 2014-11-23 ENCOUNTER — Other Ambulatory Visit: Payer: Self-pay | Admitting: Family Medicine

## 2014-11-23 DIAGNOSIS — M549 Dorsalgia, unspecified: Secondary | ICD-10-CM

## 2014-11-25 ENCOUNTER — Ambulatory Visit
Admission: RE | Admit: 2014-11-25 | Discharge: 2014-11-25 | Disposition: A | Discharge status: Home / Self Care | Payer: Medicare Other | Source: Ambulatory Visit | Attending: Family Medicine | Admitting: Family Medicine

## 2014-11-25 DIAGNOSIS — M4806 Spinal stenosis, lumbar region: Secondary | ICD-10-CM | POA: Diagnosis not present

## 2014-11-25 DIAGNOSIS — M549 Dorsalgia, unspecified: Secondary | ICD-10-CM

## 2014-11-28 DIAGNOSIS — M4806 Spinal stenosis, lumbar region: Secondary | ICD-10-CM | POA: Diagnosis not present

## 2014-12-01 ENCOUNTER — Telehealth: Payer: Self-pay | Admitting: *Deleted

## 2014-12-01 DIAGNOSIS — M545 Low back pain: Secondary | ICD-10-CM | POA: Diagnosis not present

## 2014-12-01 DIAGNOSIS — M4806 Spinal stenosis, lumbar region: Secondary | ICD-10-CM | POA: Diagnosis not present

## 2014-12-01 NOTE — Telephone Encounter (Signed)
Consuello Bossier called from [employer]. States they are treating Mrs Kalmar for stenosis/low back. Is wanting an order stating it is OK to to "Heat and electrical stimulation". Can be faxed to [phone]

## 2014-12-01 NOTE — Telephone Encounter (Signed)
Re [facility] order  Her PCP would be best to do this  Thank you

## 2014-12-07 DIAGNOSIS — M545 Low back pain: Secondary | ICD-10-CM | POA: Diagnosis not present

## 2014-12-07 DIAGNOSIS — M4806 Spinal stenosis, lumbar region: Secondary | ICD-10-CM | POA: Diagnosis not present

## 2014-12-11 DIAGNOSIS — M545 Low back pain: Secondary | ICD-10-CM | POA: Diagnosis not present

## 2014-12-11 DIAGNOSIS — M4806 Spinal stenosis, lumbar region: Secondary | ICD-10-CM | POA: Diagnosis not present

## 2014-12-12 NOTE — Telephone Encounter (Signed)
Spoke with Annett Gula at [phone] and told him to Dr. Marko Plume said PCP best to write order as noted below.

## 2014-12-15 DIAGNOSIS — M545 Low back pain: Secondary | ICD-10-CM | POA: Diagnosis not present

## 2014-12-15 DIAGNOSIS — M4806 Spinal stenosis, lumbar region: Secondary | ICD-10-CM | POA: Diagnosis not present

## 2014-12-18 DIAGNOSIS — M4806 Spinal stenosis, lumbar region: Secondary | ICD-10-CM | POA: Diagnosis not present

## 2014-12-18 DIAGNOSIS — M545 Low back pain: Secondary | ICD-10-CM | POA: Diagnosis not present

## 2014-12-22 DIAGNOSIS — M545 Low back pain: Secondary | ICD-10-CM | POA: Diagnosis not present

## 2014-12-22 DIAGNOSIS — M4806 Spinal stenosis, lumbar region: Secondary | ICD-10-CM | POA: Diagnosis not present

## 2014-12-25 DIAGNOSIS — M545 Low back pain: Secondary | ICD-10-CM | POA: Diagnosis not present

## 2014-12-25 DIAGNOSIS — M4806 Spinal stenosis, lumbar region: Secondary | ICD-10-CM | POA: Diagnosis not present

## 2014-12-29 DIAGNOSIS — M545 Low back pain: Secondary | ICD-10-CM | POA: Diagnosis not present

## 2014-12-29 DIAGNOSIS — M4806 Spinal stenosis, lumbar region: Secondary | ICD-10-CM | POA: Diagnosis not present

## 2015-01-01 DIAGNOSIS — M47816 Spondylosis without myelopathy or radiculopathy, lumbar region: Secondary | ICD-10-CM | POA: Diagnosis not present

## 2015-01-06 DIAGNOSIS — R2681 Unsteadiness on feet: Secondary | ICD-10-CM | POA: Diagnosis not present

## 2015-01-06 DIAGNOSIS — R262 Difficulty in walking, not elsewhere classified: Secondary | ICD-10-CM | POA: Diagnosis not present

## 2015-01-06 DIAGNOSIS — M545 Low back pain: Secondary | ICD-10-CM | POA: Diagnosis not present

## 2015-01-06 DIAGNOSIS — M6281 Muscle weakness (generalized): Secondary | ICD-10-CM | POA: Diagnosis not present

## 2015-01-09 DIAGNOSIS — R262 Difficulty in walking, not elsewhere classified: Secondary | ICD-10-CM | POA: Diagnosis not present

## 2015-01-09 DIAGNOSIS — M6281 Muscle weakness (generalized): Secondary | ICD-10-CM | POA: Diagnosis not present

## 2015-01-09 DIAGNOSIS — M545 Low back pain: Secondary | ICD-10-CM | POA: Diagnosis not present

## 2015-01-09 DIAGNOSIS — R2681 Unsteadiness on feet: Secondary | ICD-10-CM | POA: Diagnosis not present

## 2015-01-10 DIAGNOSIS — N289 Disorder of kidney and ureter, unspecified: Secondary | ICD-10-CM | POA: Diagnosis not present

## 2015-01-12 ENCOUNTER — Telehealth: Payer: Self-pay | Admitting: Oncology

## 2015-01-12 NOTE — Telephone Encounter (Signed)
S/w pt, gave appt 08/02/15 @ 9.30am, reminded pt of appt on 12/21 @ 9.30 with Dr. Alycia Rossetti, and mailed both appts to pt. Pt verbalized understanding.

## 2015-01-15 DIAGNOSIS — R42 Dizziness and giddiness: Secondary | ICD-10-CM | POA: Diagnosis not present

## 2015-01-15 DIAGNOSIS — I10 Essential (primary) hypertension: Secondary | ICD-10-CM | POA: Diagnosis not present

## 2015-01-17 DIAGNOSIS — M533 Sacrococcygeal disorders, not elsewhere classified: Secondary | ICD-10-CM | POA: Diagnosis not present

## 2015-01-22 DIAGNOSIS — L570 Actinic keratosis: Secondary | ICD-10-CM | POA: Diagnosis not present

## 2015-01-22 DIAGNOSIS — D225 Melanocytic nevi of trunk: Secondary | ICD-10-CM | POA: Diagnosis not present

## 2015-01-22 DIAGNOSIS — R262 Difficulty in walking, not elsewhere classified: Secondary | ICD-10-CM | POA: Diagnosis not present

## 2015-01-22 DIAGNOSIS — L821 Other seborrheic keratosis: Secondary | ICD-10-CM | POA: Diagnosis not present

## 2015-01-22 DIAGNOSIS — R2681 Unsteadiness on feet: Secondary | ICD-10-CM | POA: Diagnosis not present

## 2015-01-22 DIAGNOSIS — M6281 Muscle weakness (generalized): Secondary | ICD-10-CM | POA: Diagnosis not present

## 2015-01-22 DIAGNOSIS — L812 Freckles: Secondary | ICD-10-CM | POA: Diagnosis not present

## 2015-01-22 DIAGNOSIS — M545 Low back pain: Secondary | ICD-10-CM | POA: Diagnosis not present

## 2015-01-26 DIAGNOSIS — R2681 Unsteadiness on feet: Secondary | ICD-10-CM | POA: Diagnosis not present

## 2015-01-26 DIAGNOSIS — R262 Difficulty in walking, not elsewhere classified: Secondary | ICD-10-CM | POA: Diagnosis not present

## 2015-01-26 DIAGNOSIS — M6281 Muscle weakness (generalized): Secondary | ICD-10-CM | POA: Diagnosis not present

## 2015-01-26 DIAGNOSIS — M545 Low back pain: Secondary | ICD-10-CM | POA: Diagnosis not present

## 2015-01-29 DIAGNOSIS — M4806 Spinal stenosis, lumbar region: Secondary | ICD-10-CM | POA: Diagnosis not present

## 2015-01-29 DIAGNOSIS — M6281 Muscle weakness (generalized): Secondary | ICD-10-CM | POA: Diagnosis not present

## 2015-01-29 DIAGNOSIS — R262 Difficulty in walking, not elsewhere classified: Secondary | ICD-10-CM | POA: Diagnosis not present

## 2015-01-29 DIAGNOSIS — M545 Low back pain: Secondary | ICD-10-CM | POA: Diagnosis not present

## 2015-01-29 DIAGNOSIS — R2681 Unsteadiness on feet: Secondary | ICD-10-CM | POA: Diagnosis not present

## 2015-01-31 ENCOUNTER — Encounter: Payer: Self-pay | Admitting: Gynecologic Oncology

## 2015-01-31 ENCOUNTER — Other Ambulatory Visit: Payer: Medicare Other

## 2015-01-31 ENCOUNTER — Ambulatory Visit: Payer: Medicare Other | Attending: Gynecologic Oncology | Admitting: Gynecologic Oncology

## 2015-01-31 VITALS — BP 167/78 | HR 79 | Temp 98.4°F | Resp 19 | Ht 63.0 in | Wt 194.4 lb

## 2015-01-31 DIAGNOSIS — C569 Malignant neoplasm of unspecified ovary: Secondary | ICD-10-CM | POA: Insufficient documentation

## 2015-01-31 DIAGNOSIS — C482 Malignant neoplasm of peritoneum, unspecified: Secondary | ICD-10-CM

## 2015-01-31 DIAGNOSIS — R2681 Unsteadiness on feet: Secondary | ICD-10-CM | POA: Diagnosis not present

## 2015-01-31 DIAGNOSIS — R262 Difficulty in walking, not elsewhere classified: Secondary | ICD-10-CM | POA: Diagnosis not present

## 2015-01-31 DIAGNOSIS — Z8543 Personal history of malignant neoplasm of ovary: Secondary | ICD-10-CM | POA: Diagnosis not present

## 2015-01-31 DIAGNOSIS — M6281 Muscle weakness (generalized): Secondary | ICD-10-CM | POA: Diagnosis not present

## 2015-01-31 DIAGNOSIS — M545 Low back pain: Secondary | ICD-10-CM | POA: Diagnosis not present

## 2015-01-31 NOTE — Progress Notes (Signed)
Consult Note: Gyn-Onc  [name] 81 y.o. female  CC:  Chief Complaint  Patient presents with  . Follow-up    HPI: Patient is a 81 year old with stage IIIc primary peritoneal or ovarian carcinoma. Her illness began with several weeks of diarrhea and abdominal bloating the fall of 2012. She was tested several times for C. difficile was negative. Dr. Ronita Hipps at that time recommended an ultrasound and ultimately a CT scan. Ultrasound showed significant ascites and her CA 125 was markedly elevated which is when we saw her. CT imaging was most consistent with markedly advanced disease that would most likely preclude optimal debulking. This in conjunction with her poor performance status the decision was made to proceed with neoadjuvant chemotherapy. She had a high-volume paracentesis to confirm the diagnosis of a gynecologic malignancy.   She had 3 cycles of neoadjuvant chemotherapy under the care of Dr. Marko Plume with paclitaxel and carboplatin. Her CA 125 prior cycle #1 was 204 and on February 28, 2011 prior to cycle #2 her CA 125 was 12.6. She also had a CT scan that showed marked resolution of her ascites and marked improvement in the omental caking. There is no mention of any significant carcinomatosis. There is no significant adenopathy. The adnexa and uterus are within normal limits.  After discussion of pros and cons she opted for definitive surgical management. On 04/01/2011 she underwent surgery by Dr. Fermin Schwab. She underwent exploratory laparotomy TAH/BSO and omental resection. There was no gross evidence of disease. Pathology revealed no residual carcinoma within the omentum. There was stromal fibrosis and chronic inflammation and scattered psammoma bodies consistent with posttreatment effect. Within the right ovary and tube was no residual carcinoma. There was benign ovarian tissue with associated serosal adhesions necrosis and scattered psammoma bodies. This was seen on the uterine  serosa. The uterus was otherwise negative. The left tube and ovary also had associated psammoma bodies but no residual carcinoma identified. She did well postoperatively and after lengthy discussions underwent an additional 3 cycles of chemotherapy which she completed in 06-20-11. CA-125 at that time was 4.2 and on 07/23/11 it was 3.3. Subsequent CA-125's have been less than 10 with her last one being 6 9/16She has one pending from today.  The most notable history is that in August 2014 her husband fell while playing ping-pong and hit his head. He had a large intracerebral bleed and it is unclear whether he had a stroke, an aneurysm rupture or related to trauma. They decided to withdraw. They have been married normal 60 years. Her husband was about to turn 45. This is been very stressful for her.  Interval History: I last saw her in 4/16 and Dr. Marko Plume saw her in 9/16. She's overall doing fairly well. Her weight is down 4# since I saw her last. She is quite bothered by her weight is well as she believes that hernia is getting a little bit bigger.  She has been having some right-sided pain and underwent an injection and is been undergoing physical therapy 3 times a week. It is helping her significantly. The doing exercises that have also helped her bladder. She was little bit concerned about her renal function and is been drinking more water. She continues to live at her retirement facility and is asking for dysplasia and the holidays with her daughter here in [place]. She overall states that she's doing pretty good.   Review of Systems:  Constitutional: Denies fever. Skin: No rash, +occasional odor under pannus  Cardiovascular:  No chest pain, shortness of breath, or edema  Pulmonary: No cough Gastro Intestinal:No nausea, vomiting, constipation, or diarrhea reported. No bright red blood per rectum or change in bowel movement. + hernia larger, no pain Genitourinary: No frequency, + urgency in the  morning only she does not wake up at night to void, or dysuria.  Denies vaginal bleeding and discharge.  Musculoskeletal: No myalgia, arthralgia, joint swelling or pain. Neurologic: No weakness Psychology: Feeling well and staying busy.  Current Meds:  Outpatient Encounter Prescriptions as of 01/31/2015  Medication Sig  . azelastine (ASTELIN) 137 MCG/SPRAY nasal spray Place 1 spray into the nose as needed. Use in each nostril as directed  . Calcium Citrate-Vitamin D (CITRACAL + D PO) Take 600 mg by mouth 2 (two) times daily.   . Cholecalciferol (VITAMIN D) 2000 UNITS tablet Take 2,000 Units by mouth daily.   Marland Kitchen lisinopril (PRINIVIL,ZESTRIL) 10 MG tablet Take 10 mg by mouth daily after breakfast.   . Multiple Vitamins-Minerals (CENTRUM SILVER PO) Take 1 tablet by mouth daily.   . nadolol (CORGARD) 20 MG tablet Take 10 mg by mouth daily after breakfast.   . omega-3 acid ethyl esters (LOVAZA) 1 G capsule Take 1 g by mouth daily.  Marland Kitchen omeprazole (PRILOSEC) 20 MG capsule Take 20 mg by mouth Daily.  Vladimir Faster Glycol-Propyl Glycol (SYSTANE) 0.4-0.3 % SOLN Place 1 drop into both eyes 4 (four) times daily.  . Red Yeast Rice Extract (RED YEAST RICE PO) Take 1 capsule by mouth 2 (two) times daily.  . vitamin E (VITAMIN E) 400 UNIT capsule Take 400 Units by mouth daily.  Marland Kitchen zolpidem (AMBIEN) 10 MG tablet Take 10 mg by mouth at bedtime.   Marland Kitchen nystatin cream (MYCOSTATIN) Apply 1 application topically 3 (three) times daily as needed. Applies to Outside of vagina and anus for Itching (Patient not taking: Reported on 01/31/2015)  . nystatin-triamcinolone ointment (MYCOLOG) Apply topically 2 (two) times daily as needed. (Patient not taking: Reported on 01/31/2015)  . PATADAY 0.2 % SOLN as needed. Reported on 01/31/2015  . Ranibizumab (LUCENTIS IO) Place into the left eye once as needed. Reported on 01/31/2015   No facility-administered encounter medications on file as of 01/31/2015.    Allergy:  Allergies   Allergen Reactions  . Morphine And Related     Given after knee replacement and code blue occurred  . Penicillins     REACTION: hives  . Shellfish Allergy     Pt has shellfish allergy only.  Has had IV contrast x 2 and did fine.    Social Hx:   Social History   Social History  . Marital Status: Widowed    Spouse Name: N/A  . Number of Children: N/A  . Years of Education: N/A   Occupational History  . Not on file.   Social History Main Topics  . Smoking status: Former Smoker    Quit date: 02/11/1952  . Smokeless tobacco: Never Used  . Alcohol Use: No  . Drug Use: No  . Sexual Activity: No   Other Topics Concern  . Not on file   Social History Narrative    Past Surgical Hx:  Past Surgical History  Procedure Laterality Date  . Tonsillectomy    . Appendectomy    . Joint replacement      R knee in 2008, 5 operations on L knee  . Other surgical history      hx of C section 1966  . Laparotomy  04/01/2011  Procedure: EXPLORATORY LAPAROTOMY;  Surgeon: Alvino Chapel, MD;  Location: [facility];  Service: Gynecology;  Laterality: N/A;  . Abdominal hysterectomy  04/01/2011    Procedure: HYSTERECTOMY ABDOMINAL;  Surgeon: Alvino Chapel, MD;  Location: [facility];  Service: Gynecology;  Laterality: N/A;  . Salpingoophorectomy  04/01/2011    Procedure: SALPINGO OOPHERECTOMY;  Surgeon: Alvino Chapel, MD;  Location: [facility];  Service: Gynecology;  Laterality: Bilateral;    Past Medical Hx:  Past Medical History  Diagnosis Date  . Ascites   . Cystitis   . GERD (gastroesophageal reflux disease)   . Hypertension   . Hyponatremia   . Hyperlipidemia   . Osteopenia   . Postmenopausal atrophic vaginitis   . Vulvitis   . Staph infection 2010    after knee replacement  . Urinary frequency   . Complication of anesthesia     Sodium drops per pt   . Pneumonia     hx of   . Anemia     hx of   . Blood transfusion     hx of 2011   . H/O hiatal hernia    . Arthritis     knees   . Ovarian cancer ([place]) 01/25/2011  . DIARRHEA, ANTIBIOTIC ASSOCIATED 05/31/2009    Qualifier: Diagnosis of  By: Tommy Medal MD, Roderic Scarce    . ARTHRITIS, SEPTIC 05/28/2009    Qualifier: Diagnosis of  By: Tommy Medal MD, Roderic Scarce    . Anemia associated with acute blood loss 04/03/2011  . OSTEOMYELITIS, CHRONIC, LOWER LEG 04/23/2009    Qualifier: Diagnosis of  By: Johnnye Sima MD, Dellis Filbert    . OSTEOPOROSIS 04/23/2009    Qualifier: Diagnosis of  By: Johnnye Sima MD, Dellis Filbert    . PROSTHETIC JOINT COMPLICATION [id number]    Qualifier: Diagnosis of  By: Tommy Medal MD, Roderic Scarce    . Neutropenia with fever ([place]) 05/18/2011    Oncology Hx:    Ovarian cancer ([place])   01/25/2011 Initial Diagnosis Ovarian cancer   01/27/2011 - 06/23/2011 Chemotherapy 6 cycles paclitaxel and carboplatin   04/01/2011 Surgery optimal debulking post three cycles of neoadjuvant chemotherapy followed by three cycles of chemotherapy for a total of 6    Family Hx:  Family History  Problem Relation Age of Onset  . Heart attack Brother   . Diabetes Brother   . Cancer Other     Bladder cancer    Vitals:  Blood pressure 167/78, pulse 79, temperature 98.4 F (36.9 C), temperature source Oral, resp. rate 19, height 5\' 3"  (1.6 m), weight 194 lb 7 oz (88.196 kg), SpO2 99 %.  Physical Exam:  Well-nourished well-developed female in no acute distress.   Neck: Supple, no lymphadenopathy, no thyromegaly.   Lungs: Clear to auscultation bilaterally.   Cardiovascular: Regular rate and rhythm.   Abdomen: Well-healed vertical incision. She has a reducible, non tender incisional hernia towards the left side of her incision and a palpable 8 cm opening in the fascia is appreciated. Easily reducible. Abdomen is soft, nontender, nondistended.  Groins: No lymphadenopathy.   Extremities: 1+ trace edema.   Pelvic: Atrophic external genitalia. There were no palpable masses on bimanual examination. There is no nodularity. Rectal  confirms.   Assessment/Plan:  81 year old diagnosed with advanced serous either primary peritoneal or ovarian carcinoma in December of 2012. Based on CT findings she was treated with neoadjuvant chemotherapy with carboplatin and paclitaxel from 01/27/2011 through March 10 2011. Her CA125 responded nicely as did her imaging. She had an  optimal cytoreductive surgery by Dr. Fermin Schwab. Final pathology revealed no evidence of disease. In an additional 3 cycles of chemotherapy after her debulking surgery were given and she has had no evidence of disease since that time. Her clinical exam today is unremarkable. Her CA-125 from today is pending.  She will followup with Dr. Marko Plume in 6 months and return to see [place] in 12 months as she is at 4 years post diagnosis.  We again discussed the hernia at some length today at this point she was to follow her expectantly. We'll follow-up on the results of her CA-125 from today.   Dannon Nguyenthi A., MD 01/31/2015, 10:45 AM

## 2015-01-31 NOTE — Patient Instructions (Addendum)
Follow-up with Dr. Marko Plume in 6 months and return to see GYN oncology in one year.   When you are here seeing Dr. Marko Plume, we will schedule you for your appointment to see Dr. Alycia Rossetti.

## 2015-02-01 ENCOUNTER — Ambulatory Visit: Payer: Medicare Other | Admitting: Gynecologic Oncology

## 2015-02-01 LAB — CA 125: CA 125: 6 U/mL (ref <35)

## 2015-02-02 ENCOUNTER — Telehealth: Payer: Self-pay | Admitting: Medical Oncology

## 2015-02-02 ENCOUNTER — Telehealth: Payer: Self-pay

## 2015-02-02 DIAGNOSIS — M545 Low back pain: Secondary | ICD-10-CM | POA: Diagnosis not present

## 2015-02-02 DIAGNOSIS — R262 Difficulty in walking, not elsewhere classified: Secondary | ICD-10-CM | POA: Diagnosis not present

## 2015-02-02 DIAGNOSIS — M6281 Muscle weakness (generalized): Secondary | ICD-10-CM | POA: Diagnosis not present

## 2015-02-02 DIAGNOSIS — R2681 Unsteadiness on feet: Secondary | ICD-10-CM | POA: Diagnosis not present

## 2015-02-02 NOTE — Telephone Encounter (Signed)
Orders received from [facility] to contact the patient to update with CA 125 level at 6 is "normal" . Attempted to contact the patient , no answer , left a detailed message with CA 125 level of 6 is "normal" . Call back information provided if additional questions arise.

## 2015-02-02 NOTE — Telephone Encounter (Signed)
-----   Message from Baruch Merl, RN sent at 02/02/2015  3:15 PM EST -----   ----- Message -----    From: Gordy Levan, MD    Sent: 02/01/2015   8:29 AM      To: Baruch Merl, RN  Labs seen and need follow up  Please let her know CA 125 in good low range at 6

## 2015-02-02 NOTE — Telephone Encounter (Signed)
Pt.notified

## 2015-02-07 DIAGNOSIS — M545 Low back pain: Secondary | ICD-10-CM | POA: Diagnosis not present

## 2015-02-07 DIAGNOSIS — R2681 Unsteadiness on feet: Secondary | ICD-10-CM | POA: Diagnosis not present

## 2015-02-07 DIAGNOSIS — R262 Difficulty in walking, not elsewhere classified: Secondary | ICD-10-CM | POA: Diagnosis not present

## 2015-02-07 DIAGNOSIS — M6281 Muscle weakness (generalized): Secondary | ICD-10-CM | POA: Diagnosis not present

## 2015-02-10 DIAGNOSIS — J069 Acute upper respiratory infection, unspecified: Secondary | ICD-10-CM | POA: Diagnosis not present

## 2015-03-01 DIAGNOSIS — R2681 Unsteadiness on feet: Secondary | ICD-10-CM | POA: Diagnosis not present

## 2015-03-01 DIAGNOSIS — M6281 Muscle weakness (generalized): Secondary | ICD-10-CM | POA: Diagnosis not present

## 2015-03-01 DIAGNOSIS — M545 Low back pain: Secondary | ICD-10-CM | POA: Diagnosis not present

## 2015-03-01 DIAGNOSIS — R262 Difficulty in walking, not elsewhere classified: Secondary | ICD-10-CM | POA: Diagnosis not present

## 2015-03-06 DIAGNOSIS — R2681 Unsteadiness on feet: Secondary | ICD-10-CM | POA: Diagnosis not present

## 2015-03-06 DIAGNOSIS — R262 Difficulty in walking, not elsewhere classified: Secondary | ICD-10-CM | POA: Diagnosis not present

## 2015-03-06 DIAGNOSIS — M6281 Muscle weakness (generalized): Secondary | ICD-10-CM | POA: Diagnosis not present

## 2015-03-06 DIAGNOSIS — M545 Low back pain: Secondary | ICD-10-CM | POA: Diagnosis not present

## 2015-03-08 DIAGNOSIS — M545 Low back pain: Secondary | ICD-10-CM | POA: Diagnosis not present

## 2015-03-08 DIAGNOSIS — R262 Difficulty in walking, not elsewhere classified: Secondary | ICD-10-CM | POA: Diagnosis not present

## 2015-03-08 DIAGNOSIS — R2681 Unsteadiness on feet: Secondary | ICD-10-CM | POA: Diagnosis not present

## 2015-03-08 DIAGNOSIS — M6281 Muscle weakness (generalized): Secondary | ICD-10-CM | POA: Diagnosis not present

## 2015-03-12 DIAGNOSIS — R262 Difficulty in walking, not elsewhere classified: Secondary | ICD-10-CM | POA: Diagnosis not present

## 2015-03-12 DIAGNOSIS — R2681 Unsteadiness on feet: Secondary | ICD-10-CM | POA: Diagnosis not present

## 2015-03-12 DIAGNOSIS — M6281 Muscle weakness (generalized): Secondary | ICD-10-CM | POA: Diagnosis not present

## 2015-03-12 DIAGNOSIS — M545 Low back pain: Secondary | ICD-10-CM | POA: Diagnosis not present

## 2015-03-16 DIAGNOSIS — R262 Difficulty in walking, not elsewhere classified: Secondary | ICD-10-CM | POA: Diagnosis not present

## 2015-03-16 DIAGNOSIS — M545 Low back pain: Secondary | ICD-10-CM | POA: Diagnosis not present

## 2015-03-16 DIAGNOSIS — M79671 Pain in right foot: Secondary | ICD-10-CM | POA: Diagnosis not present

## 2015-03-16 DIAGNOSIS — R2681 Unsteadiness on feet: Secondary | ICD-10-CM | POA: Diagnosis not present

## 2015-03-16 DIAGNOSIS — M6281 Muscle weakness (generalized): Secondary | ICD-10-CM | POA: Diagnosis not present

## 2015-03-19 DIAGNOSIS — M79671 Pain in right foot: Secondary | ICD-10-CM | POA: Diagnosis not present

## 2015-03-19 DIAGNOSIS — M545 Low back pain: Secondary | ICD-10-CM | POA: Diagnosis not present

## 2015-03-19 DIAGNOSIS — M6281 Muscle weakness (generalized): Secondary | ICD-10-CM | POA: Diagnosis not present

## 2015-03-19 DIAGNOSIS — R2681 Unsteadiness on feet: Secondary | ICD-10-CM | POA: Diagnosis not present

## 2015-03-19 DIAGNOSIS — R262 Difficulty in walking, not elsewhere classified: Secondary | ICD-10-CM | POA: Diagnosis not present

## 2015-03-21 DIAGNOSIS — M545 Low back pain: Secondary | ICD-10-CM | POA: Diagnosis not present

## 2015-03-21 DIAGNOSIS — M79671 Pain in right foot: Secondary | ICD-10-CM | POA: Diagnosis not present

## 2015-03-21 DIAGNOSIS — M6281 Muscle weakness (generalized): Secondary | ICD-10-CM | POA: Diagnosis not present

## 2015-03-21 DIAGNOSIS — R262 Difficulty in walking, not elsewhere classified: Secondary | ICD-10-CM | POA: Diagnosis not present

## 2015-03-21 DIAGNOSIS — R2681 Unsteadiness on feet: Secondary | ICD-10-CM | POA: Diagnosis not present

## 2015-03-26 DIAGNOSIS — R262 Difficulty in walking, not elsewhere classified: Secondary | ICD-10-CM | POA: Diagnosis not present

## 2015-03-26 DIAGNOSIS — M545 Low back pain: Secondary | ICD-10-CM | POA: Diagnosis not present

## 2015-03-26 DIAGNOSIS — R2681 Unsteadiness on feet: Secondary | ICD-10-CM | POA: Diagnosis not present

## 2015-03-26 DIAGNOSIS — M79671 Pain in right foot: Secondary | ICD-10-CM | POA: Diagnosis not present

## 2015-03-26 DIAGNOSIS — M6281 Muscle weakness (generalized): Secondary | ICD-10-CM | POA: Diagnosis not present

## 2015-03-30 ENCOUNTER — Telehealth: Payer: Self-pay | Admitting: *Deleted

## 2015-03-30 DIAGNOSIS — M6281 Muscle weakness (generalized): Secondary | ICD-10-CM | POA: Diagnosis not present

## 2015-03-30 DIAGNOSIS — M79671 Pain in right foot: Secondary | ICD-10-CM | POA: Diagnosis not present

## 2015-03-30 DIAGNOSIS — R262 Difficulty in walking, not elsewhere classified: Secondary | ICD-10-CM | POA: Diagnosis not present

## 2015-03-30 DIAGNOSIS — R2681 Unsteadiness on feet: Secondary | ICD-10-CM | POA: Diagnosis not present

## 2015-03-30 DIAGNOSIS — M545 Low back pain: Secondary | ICD-10-CM | POA: Diagnosis not present

## 2015-03-30 NOTE — Telephone Encounter (Addendum)
Pt request PT from [facility].  04/02/2015-LEFT MESSAGE REQUESTING confirmation of PT facility, phone and fax numbers.  04/03/2015-INFORMED PT THE Referral to Physical Therapy has been faxed to [facility].

## 2015-03-31 NOTE — Telephone Encounter (Signed)
OK to evaluate and treat.  Might add balance and gait training.

## 2015-04-02 DIAGNOSIS — R262 Difficulty in walking, not elsewhere classified: Secondary | ICD-10-CM | POA: Diagnosis not present

## 2015-04-02 DIAGNOSIS — M545 Low back pain: Secondary | ICD-10-CM | POA: Diagnosis not present

## 2015-04-02 DIAGNOSIS — M79671 Pain in right foot: Secondary | ICD-10-CM | POA: Diagnosis not present

## 2015-04-02 DIAGNOSIS — R2681 Unsteadiness on feet: Secondary | ICD-10-CM | POA: Diagnosis not present

## 2015-04-02 DIAGNOSIS — M6281 Muscle weakness (generalized): Secondary | ICD-10-CM | POA: Diagnosis not present

## 2015-04-03 ENCOUNTER — Telehealth: Payer: Self-pay | Admitting: *Deleted

## 2015-04-03 NOTE — Telephone Encounter (Addendum)
Left message [facility] to call with working fax number.  04/04/2015-I SPOKE [facility], she states the phone and the fax number are the same. I faxed to [phone] as requested.

## 2015-04-05 DIAGNOSIS — R2681 Unsteadiness on feet: Secondary | ICD-10-CM | POA: Diagnosis not present

## 2015-04-05 DIAGNOSIS — Z1231 Encounter for screening mammogram for malignant neoplasm of breast: Secondary | ICD-10-CM | POA: Diagnosis not present

## 2015-04-05 DIAGNOSIS — M6281 Muscle weakness (generalized): Secondary | ICD-10-CM | POA: Diagnosis not present

## 2015-04-05 DIAGNOSIS — M79671 Pain in right foot: Secondary | ICD-10-CM | POA: Diagnosis not present

## 2015-04-05 DIAGNOSIS — R262 Difficulty in walking, not elsewhere classified: Secondary | ICD-10-CM | POA: Diagnosis not present

## 2015-04-05 DIAGNOSIS — M545 Low back pain: Secondary | ICD-10-CM | POA: Diagnosis not present

## 2015-04-10 ENCOUNTER — Telehealth: Payer: Self-pay | Admitting: *Deleted

## 2015-04-10 NOTE — Telephone Encounter (Signed)
Pt called states [facility] has not received the orders for PT.  I left message informing pt I would fax again, although I had spoken with Fransisco Beau and gotten the fax number from her, and confirmation that the faxed order had been received.  Faxed to [phone].

## 2015-04-12 DIAGNOSIS — M4806 Spinal stenosis, lumbar region: Secondary | ICD-10-CM | POA: Diagnosis not present

## 2015-04-23 ENCOUNTER — Ambulatory Visit
Admission: RE | Admit: 2015-04-23 | Discharge: 2015-04-23 | Disposition: A | Discharge status: Home / Self Care | Payer: Medicare Other | Source: Ambulatory Visit | Attending: Physical Medicine and Rehabilitation | Admitting: Physical Medicine and Rehabilitation

## 2015-04-23 ENCOUNTER — Other Ambulatory Visit: Payer: Self-pay | Admitting: Physical Medicine and Rehabilitation

## 2015-04-23 DIAGNOSIS — R102 Pelvic and perineal pain: Secondary | ICD-10-CM

## 2015-04-23 DIAGNOSIS — M4806 Spinal stenosis, lumbar region: Secondary | ICD-10-CM | POA: Diagnosis not present

## 2015-04-25 DIAGNOSIS — M533 Sacrococcygeal disorders, not elsewhere classified: Secondary | ICD-10-CM | POA: Diagnosis not present

## 2015-04-26 DIAGNOSIS — M79671 Pain in right foot: Secondary | ICD-10-CM | POA: Diagnosis not present

## 2015-04-26 DIAGNOSIS — R262 Difficulty in walking, not elsewhere classified: Secondary | ICD-10-CM | POA: Diagnosis not present

## 2015-04-26 DIAGNOSIS — M545 Low back pain: Secondary | ICD-10-CM | POA: Diagnosis not present

## 2015-04-26 DIAGNOSIS — M6281 Muscle weakness (generalized): Secondary | ICD-10-CM | POA: Diagnosis not present

## 2015-04-26 DIAGNOSIS — R2681 Unsteadiness on feet: Secondary | ICD-10-CM | POA: Diagnosis not present

## 2015-04-28 DIAGNOSIS — K5900 Constipation, unspecified: Secondary | ICD-10-CM | POA: Diagnosis not present

## 2015-04-30 DIAGNOSIS — M79671 Pain in right foot: Secondary | ICD-10-CM | POA: Diagnosis not present

## 2015-04-30 DIAGNOSIS — M545 Low back pain: Secondary | ICD-10-CM | POA: Diagnosis not present

## 2015-04-30 DIAGNOSIS — R2681 Unsteadiness on feet: Secondary | ICD-10-CM | POA: Diagnosis not present

## 2015-04-30 DIAGNOSIS — R262 Difficulty in walking, not elsewhere classified: Secondary | ICD-10-CM | POA: Diagnosis not present

## 2015-04-30 DIAGNOSIS — M6281 Muscle weakness (generalized): Secondary | ICD-10-CM | POA: Diagnosis not present

## 2015-05-03 DIAGNOSIS — R262 Difficulty in walking, not elsewhere classified: Secondary | ICD-10-CM | POA: Diagnosis not present

## 2015-05-03 DIAGNOSIS — R2681 Unsteadiness on feet: Secondary | ICD-10-CM | POA: Diagnosis not present

## 2015-05-03 DIAGNOSIS — M6281 Muscle weakness (generalized): Secondary | ICD-10-CM | POA: Diagnosis not present

## 2015-05-03 DIAGNOSIS — M545 Low back pain: Secondary | ICD-10-CM | POA: Diagnosis not present

## 2015-05-03 DIAGNOSIS — M79671 Pain in right foot: Secondary | ICD-10-CM | POA: Diagnosis not present

## 2015-05-08 DIAGNOSIS — M79671 Pain in right foot: Secondary | ICD-10-CM | POA: Diagnosis not present

## 2015-05-08 DIAGNOSIS — R262 Difficulty in walking, not elsewhere classified: Secondary | ICD-10-CM | POA: Diagnosis not present

## 2015-05-08 DIAGNOSIS — M545 Low back pain: Secondary | ICD-10-CM | POA: Diagnosis not present

## 2015-05-08 DIAGNOSIS — M6281 Muscle weakness (generalized): Secondary | ICD-10-CM | POA: Diagnosis not present

## 2015-05-08 DIAGNOSIS — R2681 Unsteadiness on feet: Secondary | ICD-10-CM | POA: Diagnosis not present

## 2015-05-10 DIAGNOSIS — M545 Low back pain: Secondary | ICD-10-CM | POA: Diagnosis not present

## 2015-05-10 DIAGNOSIS — M6281 Muscle weakness (generalized): Secondary | ICD-10-CM | POA: Diagnosis not present

## 2015-05-10 DIAGNOSIS — M79671 Pain in right foot: Secondary | ICD-10-CM | POA: Diagnosis not present

## 2015-05-10 DIAGNOSIS — R262 Difficulty in walking, not elsewhere classified: Secondary | ICD-10-CM | POA: Diagnosis not present

## 2015-05-10 DIAGNOSIS — R2681 Unsteadiness on feet: Secondary | ICD-10-CM | POA: Diagnosis not present

## 2015-05-15 DIAGNOSIS — M79671 Pain in right foot: Secondary | ICD-10-CM | POA: Diagnosis not present

## 2015-05-15 DIAGNOSIS — R2681 Unsteadiness on feet: Secondary | ICD-10-CM | POA: Diagnosis not present

## 2015-05-15 DIAGNOSIS — R262 Difficulty in walking, not elsewhere classified: Secondary | ICD-10-CM | POA: Diagnosis not present

## 2015-05-15 DIAGNOSIS — M545 Low back pain: Secondary | ICD-10-CM | POA: Diagnosis not present

## 2015-05-15 DIAGNOSIS — M6281 Muscle weakness (generalized): Secondary | ICD-10-CM | POA: Diagnosis not present

## 2015-05-17 DIAGNOSIS — R2681 Unsteadiness on feet: Secondary | ICD-10-CM | POA: Diagnosis not present

## 2015-05-17 DIAGNOSIS — R262 Difficulty in walking, not elsewhere classified: Secondary | ICD-10-CM | POA: Diagnosis not present

## 2015-05-17 DIAGNOSIS — M545 Low back pain: Secondary | ICD-10-CM | POA: Diagnosis not present

## 2015-05-17 DIAGNOSIS — M79671 Pain in right foot: Secondary | ICD-10-CM | POA: Diagnosis not present

## 2015-05-17 DIAGNOSIS — M6281 Muscle weakness (generalized): Secondary | ICD-10-CM | POA: Diagnosis not present

## 2015-05-22 DIAGNOSIS — R262 Difficulty in walking, not elsewhere classified: Secondary | ICD-10-CM | POA: Diagnosis not present

## 2015-05-22 DIAGNOSIS — M545 Low back pain: Secondary | ICD-10-CM | POA: Diagnosis not present

## 2015-05-22 DIAGNOSIS — R2681 Unsteadiness on feet: Secondary | ICD-10-CM | POA: Diagnosis not present

## 2015-05-22 DIAGNOSIS — M79671 Pain in right foot: Secondary | ICD-10-CM | POA: Diagnosis not present

## 2015-05-22 DIAGNOSIS — M6281 Muscle weakness (generalized): Secondary | ICD-10-CM | POA: Diagnosis not present

## 2015-05-24 DIAGNOSIS — M79671 Pain in right foot: Secondary | ICD-10-CM | POA: Diagnosis not present

## 2015-05-24 DIAGNOSIS — R262 Difficulty in walking, not elsewhere classified: Secondary | ICD-10-CM | POA: Diagnosis not present

## 2015-05-24 DIAGNOSIS — M6281 Muscle weakness (generalized): Secondary | ICD-10-CM | POA: Diagnosis not present

## 2015-05-24 DIAGNOSIS — M545 Low back pain: Secondary | ICD-10-CM | POA: Diagnosis not present

## 2015-05-24 DIAGNOSIS — R2681 Unsteadiness on feet: Secondary | ICD-10-CM | POA: Diagnosis not present

## 2015-05-31 DIAGNOSIS — M545 Low back pain: Secondary | ICD-10-CM | POA: Diagnosis not present

## 2015-05-31 DIAGNOSIS — R262 Difficulty in walking, not elsewhere classified: Secondary | ICD-10-CM | POA: Diagnosis not present

## 2015-05-31 DIAGNOSIS — M79671 Pain in right foot: Secondary | ICD-10-CM | POA: Diagnosis not present

## 2015-05-31 DIAGNOSIS — R2681 Unsteadiness on feet: Secondary | ICD-10-CM | POA: Diagnosis not present

## 2015-05-31 DIAGNOSIS — M6281 Muscle weakness (generalized): Secondary | ICD-10-CM | POA: Diagnosis not present

## 2015-06-07 DIAGNOSIS — M6281 Muscle weakness (generalized): Secondary | ICD-10-CM | POA: Diagnosis not present

## 2015-06-07 DIAGNOSIS — M79671 Pain in right foot: Secondary | ICD-10-CM | POA: Diagnosis not present

## 2015-06-07 DIAGNOSIS — R2681 Unsteadiness on feet: Secondary | ICD-10-CM | POA: Diagnosis not present

## 2015-06-07 DIAGNOSIS — M545 Low back pain: Secondary | ICD-10-CM | POA: Diagnosis not present

## 2015-06-07 DIAGNOSIS — R262 Difficulty in walking, not elsewhere classified: Secondary | ICD-10-CM | POA: Diagnosis not present

## 2015-06-15 DIAGNOSIS — R2681 Unsteadiness on feet: Secondary | ICD-10-CM | POA: Diagnosis not present

## 2015-06-15 DIAGNOSIS — M6281 Muscle weakness (generalized): Secondary | ICD-10-CM | POA: Diagnosis not present

## 2015-06-15 DIAGNOSIS — R262 Difficulty in walking, not elsewhere classified: Secondary | ICD-10-CM | POA: Diagnosis not present

## 2015-06-15 DIAGNOSIS — M545 Low back pain: Secondary | ICD-10-CM | POA: Diagnosis not present

## 2015-06-15 DIAGNOSIS — M79671 Pain in right foot: Secondary | ICD-10-CM | POA: Diagnosis not present

## 2015-06-29 DIAGNOSIS — R262 Difficulty in walking, not elsewhere classified: Secondary | ICD-10-CM | POA: Diagnosis not present

## 2015-06-29 DIAGNOSIS — R2681 Unsteadiness on feet: Secondary | ICD-10-CM | POA: Diagnosis not present

## 2015-06-29 DIAGNOSIS — M6281 Muscle weakness (generalized): Secondary | ICD-10-CM | POA: Diagnosis not present

## 2015-06-29 DIAGNOSIS — M545 Low back pain: Secondary | ICD-10-CM | POA: Diagnosis not present

## 2015-06-29 DIAGNOSIS — M79671 Pain in right foot: Secondary | ICD-10-CM | POA: Diagnosis not present

## 2015-07-11 DIAGNOSIS — H2512 Age-related nuclear cataract, left eye: Secondary | ICD-10-CM | POA: Diagnosis not present

## 2015-07-11 DIAGNOSIS — H348322 Tributary (branch) retinal vein occlusion, left eye, stable: Secondary | ICD-10-CM | POA: Diagnosis not present

## 2015-07-11 DIAGNOSIS — H2513 Age-related nuclear cataract, bilateral: Secondary | ICD-10-CM | POA: Diagnosis not present

## 2015-07-11 DIAGNOSIS — H01021 Squamous blepharitis right upper eyelid: Secondary | ICD-10-CM | POA: Diagnosis not present

## 2015-07-11 DIAGNOSIS — H01022 Squamous blepharitis right lower eyelid: Secondary | ICD-10-CM | POA: Diagnosis not present

## 2015-07-11 DIAGNOSIS — H01024 Squamous blepharitis left upper eyelid: Secondary | ICD-10-CM | POA: Diagnosis not present

## 2015-07-11 DIAGNOSIS — H1851 Endothelial corneal dystrophy: Secondary | ICD-10-CM | POA: Diagnosis not present

## 2015-07-11 DIAGNOSIS — H40013 Open angle with borderline findings, low risk, bilateral: Secondary | ICD-10-CM | POA: Diagnosis not present

## 2015-07-11 DIAGNOSIS — H01025 Squamous blepharitis left lower eyelid: Secondary | ICD-10-CM | POA: Diagnosis not present

## 2015-07-26 ENCOUNTER — Telehealth: Payer: Self-pay | Admitting: Oncology

## 2015-07-26 DIAGNOSIS — H348322 Tributary (branch) retinal vein occlusion, left eye, stable: Secondary | ICD-10-CM | POA: Diagnosis not present

## 2015-07-26 DIAGNOSIS — H2513 Age-related nuclear cataract, bilateral: Secondary | ICD-10-CM | POA: Diagnosis not present

## 2015-07-26 DIAGNOSIS — H35032 Hypertensive retinopathy, left eye: Secondary | ICD-10-CM | POA: Diagnosis not present

## 2015-07-26 DIAGNOSIS — H35352 Cystoid macular degeneration, left eye: Secondary | ICD-10-CM | POA: Diagnosis not present

## 2015-07-26 NOTE — Telephone Encounter (Signed)
S/w pt advised appt [facility] from 6/22 to 7/6 due to md pal. Pt says she wants to get in as soon as possible. Gave pt apt for 7/3 @ 2pm.

## 2015-07-30 ENCOUNTER — Other Ambulatory Visit: Payer: Self-pay | Admitting: Oncology

## 2015-08-02 ENCOUNTER — Ambulatory Visit: Payer: Medicare Other | Admitting: Oncology

## 2015-08-02 ENCOUNTER — Other Ambulatory Visit: Payer: Medicare Other

## 2015-08-09 DIAGNOSIS — H2512 Age-related nuclear cataract, left eye: Secondary | ICD-10-CM | POA: Diagnosis not present

## 2015-08-13 ENCOUNTER — Other Ambulatory Visit: Payer: Medicare Other

## 2015-08-13 ENCOUNTER — Ambulatory Visit: Payer: Medicare Other | Admitting: Oncology

## 2015-08-15 ENCOUNTER — Other Ambulatory Visit: Payer: Self-pay

## 2015-08-15 NOTE — Progress Notes (Signed)
lvm that we will move lab to this coming week and will keep Dr Edwyna Shell appt on 8/21

## 2015-08-16 ENCOUNTER — Other Ambulatory Visit: Payer: Medicare Other

## 2015-08-16 ENCOUNTER — Ambulatory Visit: Payer: Medicare Other | Admitting: Oncology

## 2015-08-20 ENCOUNTER — Other Ambulatory Visit (HOSPITAL_BASED_OUTPATIENT_CLINIC_OR_DEPARTMENT_OTHER): Payer: Medicare Other

## 2015-08-20 DIAGNOSIS — C569 Malignant neoplasm of unspecified ovary: Secondary | ICD-10-CM | POA: Diagnosis not present

## 2015-08-20 DIAGNOSIS — C482 Malignant neoplasm of peritoneum, unspecified: Secondary | ICD-10-CM | POA: Diagnosis not present

## 2015-08-20 DIAGNOSIS — Z8543 Personal history of malignant neoplasm of ovary: Secondary | ICD-10-CM | POA: Diagnosis present

## 2015-08-20 LAB — CBC WITH DIFFERENTIAL/PLATELET
BASO%: 1.1 % (ref 0.0–2.0)
Basophils Absolute: 0.1 10*3/uL (ref 0.0–0.1)
EOS%: 4.1 % (ref 0.0–7.0)
Eosinophils Absolute: 0.2 10*3/uL (ref 0.0–0.5)
HCT: 36.1 % (ref 34.8–46.6)
HGB: 12.2 g/dL (ref 11.6–15.9)
LYMPH%: 20.2 % (ref 14.0–49.7)
MCH: 28.6 pg (ref 25.1–34.0)
MCHC: 33.6 g/dL (ref 31.5–36.0)
MCV: 85.1 fL (ref 79.5–101.0)
MONO#: 0.7 10*3/uL (ref 0.1–0.9)
MONO%: 11.6 % (ref 0.0–14.0)
NEUT#: 3.6 10*3/uL (ref 1.5–6.5)
NEUT%: 63 % (ref 38.4–76.8)
Platelets: 184 10*3/uL (ref 145–400)
RBC: 4.25 10*6/uL (ref 3.70–5.45)
RDW: 14.5 % (ref 11.2–14.5)
WBC: 5.6 10*3/uL (ref 3.9–10.3)
lymph#: 1.1 10*3/uL (ref 0.9–3.3)

## 2015-08-20 LAB — COMPREHENSIVE METABOLIC PANEL
ALT: 13 U/L (ref 0–55)
AST: 21 U/L (ref 5–34)
Albumin: 3.7 g/dL (ref 3.5–5.0)
Alkaline Phosphatase: 53 U/L (ref 40–150)
Anion Gap: 8 mEq/L (ref 3–11)
BUN: 30.4 mg/dL — ABNORMAL HIGH (ref 7.0–26.0)
CO2: 24 mEq/L (ref 22–29)
Calcium: 9.6 mg/dL (ref 8.4–10.4)
Chloride: 101 mEq/L (ref 98–109)
Creatinine: 1.1 mg/dL (ref 0.6–1.1)
EGFR: 45 mL/min/{1.73_m2} — ABNORMAL LOW (ref >90)
Glucose: 102 mg/dl (ref 70–140)
Potassium: 4.7 mEq/L (ref 3.5–5.1)
Sodium: 133 mEq/L — ABNORMAL LOW (ref 136–145)
Total Bilirubin: 0.43 mg/dL (ref 0.20–1.20)
Total Protein: 7.2 g/dL (ref 6.4–8.3)

## 2015-08-21 LAB — CANCER ANTIGEN 125 (PARALLEL TESTING): CA 125: 6 U/mL (ref <35)

## 2015-08-21 LAB — CA 125: Cancer Antigen (CA) 125: 8.7 U/mL (ref 0.0–38.1)

## 2015-08-23 ENCOUNTER — Telehealth: Payer: Self-pay

## 2015-08-23 DIAGNOSIS — R899 Unspecified abnormal finding in specimens from other organs, systems and tissues: Secondary | ICD-10-CM | POA: Diagnosis not present

## 2015-08-23 NOTE — Telephone Encounter (Signed)
-----   Message from Gordy Levan, MD sent at 08/21/2015  8:04 AM EDT ----- Labs seen and need follow up: please let her know marker in good normal range and other blood work fine. (has apt  LL in Aug)

## 2015-08-23 NOTE — Telephone Encounter (Signed)
lvm per Dr Livesay attached message 

## 2015-08-24 DIAGNOSIS — N189 Chronic kidney disease, unspecified: Secondary | ICD-10-CM | POA: Diagnosis not present

## 2015-08-24 DIAGNOSIS — M5416 Radiculopathy, lumbar region: Secondary | ICD-10-CM | POA: Diagnosis not present

## 2015-09-06 DIAGNOSIS — H903 Sensorineural hearing loss, bilateral: Secondary | ICD-10-CM | POA: Diagnosis not present

## 2015-09-17 DIAGNOSIS — H35352 Cystoid macular degeneration, left eye: Secondary | ICD-10-CM | POA: Diagnosis not present

## 2015-09-17 DIAGNOSIS — H4720 Unspecified optic atrophy: Secondary | ICD-10-CM | POA: Diagnosis not present

## 2015-09-17 DIAGNOSIS — H348322 Tributary (branch) retinal vein occlusion, left eye, stable: Secondary | ICD-10-CM | POA: Diagnosis not present

## 2015-09-17 DIAGNOSIS — H43812 Vitreous degeneration, left eye: Secondary | ICD-10-CM | POA: Diagnosis not present

## 2015-09-18 DIAGNOSIS — M5416 Radiculopathy, lumbar region: Secondary | ICD-10-CM | POA: Diagnosis not present

## 2015-09-30 ENCOUNTER — Other Ambulatory Visit: Payer: Self-pay | Admitting: Oncology

## 2015-10-01 ENCOUNTER — Encounter: Payer: Self-pay | Admitting: Oncology

## 2015-10-01 ENCOUNTER — Other Ambulatory Visit: Payer: Medicare Other

## 2015-10-01 ENCOUNTER — Telehealth: Payer: Self-pay | Admitting: Oncology

## 2015-10-01 ENCOUNTER — Ambulatory Visit (HOSPITAL_BASED_OUTPATIENT_CLINIC_OR_DEPARTMENT_OTHER): Payer: Medicare Other | Admitting: Oncology

## 2015-10-01 VITALS — BP 141/72 | HR 74 | Temp 98.6°F | Resp 16 | Ht 63.0 in | Wt 184.9 lb

## 2015-10-01 DIAGNOSIS — N183 Chronic kidney disease, stage 3 unspecified: Secondary | ICD-10-CM

## 2015-10-01 DIAGNOSIS — K432 Incisional hernia without obstruction or gangrene: Secondary | ICD-10-CM

## 2015-10-01 DIAGNOSIS — C482 Malignant neoplasm of peritoneum, unspecified: Secondary | ICD-10-CM | POA: Diagnosis not present

## 2015-10-01 DIAGNOSIS — I700 Atherosclerosis of aorta: Secondary | ICD-10-CM

## 2015-10-01 NOTE — Telephone Encounter (Signed)
appt made and avs printed °

## 2015-10-01 NOTE — Progress Notes (Signed)
OFFICE PROGRESS NOTE   October 01, 2015   Physicians: P.Gehrig, R.Taavon, K.Little, J.Hayes, G.Rankin, S.Gross, Normajean Glasgow  INTERVAL HISTORY:   Patient is seen, alone for visit, in continuing attention to history of IIIC serous primary peritoneal carcinoma, on observation since completing chemotherapy 06-2011. She saw Dr Alycia Rossetti [phone] and will see her again in Dec of this year.  Last imaging was CT pelvis 04-2011 by orthopedist, which showed extensive degenerative changes LS spine and spinal stenosis, the large ventral hernia without obstruction, no evidence of recurrent gyn malignancy  She follows regularly with Dr Rex Kras.   Patient reports that she has generally been well. She is trying accupuncture for chronic low back orthopedic problems, as she preferred this to spinal injections. She is not concerned about the large ventral hernia, prefers not to consider any elective surgery for this. She is watching diet more closely and tries to be as active as possible, some gradual intentional weight loss. She denies abdominal or pelvic discomfort, bowels are fine, no LE swelling, no increased SOB, no bleeding, no recent infectious illness.  Remainder of 10 point Review of Systems negative.   CA 125 preop 239.  From labs 08-20-15 stable at 6 by previous lab method, which compares with 8.7 by new lab method. No PAC    ONCOLOGIC HISTORY Patient presented with several weeks of bloating and diarrhea in fall 2012, found to have ascites and carcinomatosis. Pathology was documented from the malignant ascites 01-16-2011, consistent with serous gyn primary. She required two large volume paracenteses in Dec. She received 3 cycles of neoadjuvant carboplatin/ taxol from 01-27-2011 thru 03-10-11, with gCSF support required. CA 125 was 239 in Dec 2012 and down to 4.9 by early February 2013. CT AP Jan 2013 showed resolution of ascites and significant improvement in omental disease. She went to TAH/BSO/omentectomy by  Dr.ClarkePearson on 04-01-2011, with extensive adhesions noted and oozing of ascites fluid from all peritoneal surfaces during that surgery; pathology had no residual cancer apparent. She resumed taxol/carboplatin for an additional 3 cycles from 05-12-11 thru 07-01-2011 and has been on observation since then. CA 125 05-2014 was 8.    Objective:  Vital signs in last 24 hours:  BP (!) 141/72 (BP Location: Left Arm, Patient Position: Sitting) Comment: made the nurse aware  Pulse 74   Temp 98.6 F (37 C) (Oral)   Resp 16   Ht '5\' 3"'  (1.6 m)   Wt 184 lb 14.4 oz (83.9 kg)   SpO2 97%   BMI 32.75 kg/m  Weight down 10 lbs, intentional. Looks comfortable in exam room, neatly dressed, in good spirits and very pleasant. Alert, oriented and appropriate. Ambulatory without assistance .  Alopecia  HEENT:PERRL, sclerae not icteric. Oral mucosa moist without lesions, posterior pharynx clear.  Neck supple. No JVD.  Lymphatics:no cervical,supraclavicular adenopathy Resp: clear to auscultation bilaterally  Cardio: regular rate and rhythm. No gallop. GI: abdomen obese, soft, nontender, no mass or organomegaly. Normally active bowel sounds. Very large ventral hernia upper left, soft, not tender. Musculoskeletal/ Extremities: without pitting edema, cords, tenderness Neuro: no peripheral neuropathy. Otherwise nonfocal. PSYCH appropriate mood and affect Skin without rash, ecchymosis, petechiae   Lab Results:  Results for orders placed or performed in visit on 08/20/15  CA 125  Result Value Ref Range   Cancer Antigen (CA) 125 8.7 0.0 - 38.1 U/mL  CBC with Differential  Result Value Ref Range   WBC 5.6 3.9 - 10.3 10e3/uL   NEUT# 3.6 1.5 - 6.5 10e3/uL  HGB 12.2 11.6 - 15.9 g/dL   HCT 36.1 34.8 - 46.6 %   Platelets 184 145 - 400 10e3/uL   MCV 85.1 79.5 - 101.0 fL   MCH 28.6 25.1 - 34.0 pg   MCHC 33.6 31.5 - 36.0 g/dL   RBC 4.25 3.70 - 5.45 10e6/uL   RDW 14.5 11.2 - 14.5 %   lymph# 1.1 0.9 - 3.3  10e3/uL   MONO# 0.7 0.1 - 0.9 10e3/uL   Eosinophils Absolute 0.2 0.0 - 0.5 10e3/uL   Basophils Absolute 0.1 0.0 - 0.1 10e3/uL   NEUT% 63.0 38.4 - 76.8 %   LYMPH% 20.2 14.0 - 49.7 %   MONO% 11.6 0.0 - 14.0 %   EOS% 4.1 0.0 - 7.0 %   BASO% 1.1 0.0 - 2.0 %  CA 125 (Parallel Testing)  Result Value Ref Range   CA 125 6 <35 U/mL  Comprehensive metabolic panel  Result Value Ref Range   Sodium 133 (L) 136 - 145 mEq/L   Potassium 4.7 3.5 - 5.1 mEq/L   Chloride 101 98 - 109 mEq/L   CO2 24 22 - 29 mEq/L   Glucose 102 70 - 140 mg/dl   BUN 30.4 (H) 7.0 - 26.0 mg/dL   Creatinine 1.1 0.6 - 1.1 mg/dL   Total Bilirubin 0.43 0.20 - 1.20 mg/dL   Alkaline Phosphatase 53 40 - 150 U/L   AST 21 5 - 34 U/L   ALT 13 0 - 55 U/L   Total Protein 7.2 6.4 - 8.3 g/dL   Albumin 3.7 3.5 - 5.0 g/dL   Calcium 9.6 8.4 - 10.4 mg/dL   Anion Gap 8 3 - 11 mEq/L   EGFR 45 (L) >90 ml/min/1.73 m2  Reviewed CA 125 marker at visit; "parallel testing"= prior lab method  Studies/Results:  CT PELVIS WITHOUT CONTRAST  04-2015  COMPARISON:  03/05/2011.  FINDINGS: No fracture or osseous destructive lesion.  Bilateral sacroiliac joint degenerative changes with sclerosis.  Facet degenerative changes L4-5 and L5-S1 with grade 1 anterior slip L4 and L5 with multifactorial spinal stenosis and lateral recess stenosis greater at the L4-5 level.  Large left lower abdomen/pelvic small bowel and colon containing hernia. This does not appear to be causing obstruction.  Colonic diverticula without inflammation.  Atherosclerotic changes aorta and iliac arteries.  Postsurgical changes pelvis.  No adenopathy.  No primary urinary bladder abnormality.  IMPRESSION: No fracture or osseous destructive lesion.  Bilateral sacroiliac joint degenerative changes with sclerosis.  Facet degenerative changes L4-5 and L5-S1 with grade 1 anterior slip L4 and L5 with multifactorial spinal stenosis and lateral  recess stenosis greater at the L4-5 level.  Large left lower abdomen/pelvic small bowel and colon containing hernia. This does not appear to be causing obstruction.  Colonic diverticula without inflammation   Medications: I have reviewed the patient's current medications.  DISCUSSION Clinically doing well from standpoint of primary peritoneal malignancy, with advanced disease at diagnosis, excellent response to neoadjuvant chemotherapy and path CR at interval surgery 2013. She is now out 5 years from diagnosis. She will see Dr Alycia Rossetti in 01-30-16 with CA 125. She can be seen back at this office at any time if needed.    Assessment/Plan: 1.Serous gyn carcinoma: history as above, clinically continues to do well. She will see Dr Alycia Rossetti in [place] 125 repeated then.  2.ventral hernia: prefers no surgery 3.hx HTN, GERD, and SIADH with prior surgeries  4.flu vaccine anticipated this fall 5.chronic kidney disease stage III: encourage  good hydration. Cc labs PCP 7.obesity, BMI better at 32. Intentional weight loss 8.degenerative changes LS spine and spinal stenosis: known to orthopedics, trying accupuncture. 9.diverticular disease by CT. Not symptomatic 10. Atherosclerotic findings on CT: report copied above, cc Dr Rex Kras 11.CKD3 based on labs today  All questions answered. Time spent 20 min including >50% counseling and coordination of care. Cc Dr Rex Kras  Evlyn Clines, MD   10/01/2015, 3:13 PM

## 2015-10-02 DIAGNOSIS — I700 Atherosclerosis of aorta: Secondary | ICD-10-CM | POA: Insufficient documentation

## 2015-10-12 DIAGNOSIS — R8290 Unspecified abnormal findings in urine: Secondary | ICD-10-CM | POA: Diagnosis not present

## 2015-10-12 DIAGNOSIS — K219 Gastro-esophageal reflux disease without esophagitis: Secondary | ICD-10-CM | POA: Diagnosis not present

## 2015-10-12 DIAGNOSIS — M8580 Other specified disorders of bone density and structure, unspecified site: Secondary | ICD-10-CM | POA: Diagnosis not present

## 2015-10-12 DIAGNOSIS — E782 Mixed hyperlipidemia: Secondary | ICD-10-CM | POA: Diagnosis not present

## 2015-10-12 DIAGNOSIS — Z0000 Encounter for general adult medical examination without abnormal findings: Secondary | ICD-10-CM | POA: Diagnosis not present

## 2015-10-12 DIAGNOSIS — N189 Chronic kidney disease, unspecified: Secondary | ICD-10-CM | POA: Diagnosis not present

## 2015-10-12 DIAGNOSIS — J309 Allergic rhinitis, unspecified: Secondary | ICD-10-CM | POA: Diagnosis not present

## 2015-10-12 DIAGNOSIS — M899 Disorder of bone, unspecified: Secondary | ICD-10-CM | POA: Diagnosis not present

## 2015-10-12 DIAGNOSIS — Z23 Encounter for immunization: Secondary | ICD-10-CM | POA: Diagnosis not present

## 2015-10-12 DIAGNOSIS — G8929 Other chronic pain: Secondary | ICD-10-CM | POA: Diagnosis not present

## 2015-10-12 DIAGNOSIS — I10 Essential (primary) hypertension: Secondary | ICD-10-CM | POA: Diagnosis not present

## 2015-10-12 DIAGNOSIS — Z8543 Personal history of malignant neoplasm of ovary: Secondary | ICD-10-CM | POA: Diagnosis not present

## 2015-11-02 DIAGNOSIS — Z96651 Presence of right artificial knee joint: Secondary | ICD-10-CM | POA: Diagnosis not present

## 2015-11-02 DIAGNOSIS — Z96652 Presence of left artificial knee joint: Secondary | ICD-10-CM | POA: Diagnosis not present

## 2015-11-02 DIAGNOSIS — Z96653 Presence of artificial knee joint, bilateral: Secondary | ICD-10-CM | POA: Diagnosis not present

## 2015-11-02 DIAGNOSIS — Z471 Aftercare following joint replacement surgery: Secondary | ICD-10-CM | POA: Diagnosis not present

## 2015-11-08 DIAGNOSIS — M5416 Radiculopathy, lumbar region: Secondary | ICD-10-CM | POA: Diagnosis not present

## 2015-11-23 DIAGNOSIS — M5416 Radiculopathy, lumbar region: Secondary | ICD-10-CM | POA: Diagnosis not present

## 2015-12-11 DIAGNOSIS — Z961 Presence of intraocular lens: Secondary | ICD-10-CM | POA: Diagnosis not present

## 2015-12-11 DIAGNOSIS — H25811 Combined forms of age-related cataract, right eye: Secondary | ICD-10-CM | POA: Diagnosis not present

## 2015-12-11 DIAGNOSIS — H01024 Squamous blepharitis left upper eyelid: Secondary | ICD-10-CM | POA: Diagnosis not present

## 2015-12-11 DIAGNOSIS — H01022 Squamous blepharitis right lower eyelid: Secondary | ICD-10-CM | POA: Diagnosis not present

## 2015-12-11 DIAGNOSIS — H01025 Squamous blepharitis left lower eyelid: Secondary | ICD-10-CM | POA: Diagnosis not present

## 2015-12-11 DIAGNOSIS — H01021 Squamous blepharitis right upper eyelid: Secondary | ICD-10-CM | POA: Diagnosis not present

## 2015-12-11 DIAGNOSIS — H348322 Tributary (branch) retinal vein occlusion, left eye, stable: Secondary | ICD-10-CM | POA: Diagnosis not present

## 2015-12-11 DIAGNOSIS — H40013 Open angle with borderline findings, low risk, bilateral: Secondary | ICD-10-CM | POA: Diagnosis not present

## 2015-12-11 DIAGNOSIS — H1851 Endothelial corneal dystrophy: Secondary | ICD-10-CM | POA: Diagnosis not present

## 2015-12-31 DIAGNOSIS — H43812 Vitreous degeneration, left eye: Secondary | ICD-10-CM | POA: Diagnosis not present

## 2015-12-31 DIAGNOSIS — H35352 Cystoid macular degeneration, left eye: Secondary | ICD-10-CM | POA: Diagnosis not present

## 2015-12-31 DIAGNOSIS — H34832 Tributary (branch) retinal vein occlusion, left eye, with macular edema: Secondary | ICD-10-CM | POA: Diagnosis not present

## 2015-12-31 DIAGNOSIS — H4720 Unspecified optic atrophy: Secondary | ICD-10-CM | POA: Diagnosis not present

## 2016-01-21 DIAGNOSIS — H34832 Tributary (branch) retinal vein occlusion, left eye, with macular edema: Secondary | ICD-10-CM | POA: Diagnosis not present

## 2016-01-30 ENCOUNTER — Ambulatory Visit: Payer: Medicare Other | Attending: Gynecologic Oncology | Admitting: Gynecologic Oncology

## 2016-01-30 ENCOUNTER — Encounter: Payer: Self-pay | Admitting: Gynecologic Oncology

## 2016-01-30 ENCOUNTER — Other Ambulatory Visit: Payer: Self-pay | Admitting: Gynecologic Oncology

## 2016-01-30 ENCOUNTER — Other Ambulatory Visit (HOSPITAL_BASED_OUTPATIENT_CLINIC_OR_DEPARTMENT_OTHER): Payer: Medicare Other

## 2016-01-30 VITALS — BP 159/84 | HR 71 | Temp 97.5°F | Resp 18 | Ht 63.0 in | Wt 180.0 lb

## 2016-01-30 DIAGNOSIS — E785 Hyperlipidemia, unspecified: Secondary | ICD-10-CM | POA: Diagnosis not present

## 2016-01-30 DIAGNOSIS — Z8543 Personal history of malignant neoplasm of ovary: Secondary | ICD-10-CM | POA: Insufficient documentation

## 2016-01-30 DIAGNOSIS — Z87891 Personal history of nicotine dependence: Secondary | ICD-10-CM | POA: Insufficient documentation

## 2016-01-30 DIAGNOSIS — Z90722 Acquired absence of ovaries, bilateral: Secondary | ICD-10-CM | POA: Diagnosis not present

## 2016-01-30 DIAGNOSIS — C569 Malignant neoplasm of unspecified ovary: Secondary | ICD-10-CM

## 2016-01-30 DIAGNOSIS — C482 Malignant neoplasm of peritoneum, unspecified: Secondary | ICD-10-CM

## 2016-01-30 DIAGNOSIS — Z08 Encounter for follow-up examination after completed treatment for malignant neoplasm: Secondary | ICD-10-CM | POA: Insufficient documentation

## 2016-01-30 DIAGNOSIS — M8580 Other specified disorders of bone density and structure, unspecified site: Secondary | ICD-10-CM | POA: Diagnosis not present

## 2016-01-30 DIAGNOSIS — M810 Age-related osteoporosis without current pathological fracture: Secondary | ICD-10-CM | POA: Diagnosis not present

## 2016-01-30 DIAGNOSIS — N898 Other specified noninflammatory disorders of vagina: Secondary | ICD-10-CM

## 2016-01-30 DIAGNOSIS — Z96659 Presence of unspecified artificial knee joint: Secondary | ICD-10-CM | POA: Diagnosis not present

## 2016-01-30 DIAGNOSIS — I10 Essential (primary) hypertension: Secondary | ICD-10-CM | POA: Insufficient documentation

## 2016-01-30 DIAGNOSIS — Z90710 Acquired absence of both cervix and uterus: Secondary | ICD-10-CM | POA: Diagnosis not present

## 2016-01-30 DIAGNOSIS — K219 Gastro-esophageal reflux disease without esophagitis: Secondary | ICD-10-CM | POA: Insufficient documentation

## 2016-01-30 DIAGNOSIS — Z9079 Acquired absence of other genital organ(s): Secondary | ICD-10-CM | POA: Diagnosis not present

## 2016-01-30 DIAGNOSIS — M1990 Unspecified osteoarthritis, unspecified site: Secondary | ICD-10-CM | POA: Diagnosis not present

## 2016-01-30 MED ORDER — NYSTATIN 100000 UNIT/GM EX POWD: Freq: Four times a day (QID) | CUTANEOUS | 0 refills | Status: DC

## 2016-01-30 MED ORDER — NYSTATIN 100000 UNIT/GM EX CREA: 1.0000 "application " | TOPICAL_CREAM | Freq: Three times a day (TID) | CUTANEOUS | 1 refills | Status: DC | PRN

## 2016-01-30 NOTE — Patient Instructions (Addendum)
We will notify you of the results of your CA-125 from today. Happy holidays!. Please return to see GYN oncology in one year. That'll put you well past your 5 year anniversary at which point you can be released from our clinic. We are so pleased that you have done so well.  Nystatin topical powder What is this medicine? NYSTATIN (nye STAT in) is an antifungal medicine. It is used to treat certain kinds of fungal or yeast infections of the skin. This medicine may be used for other purposes; ask your health care provider or pharmacist if you have questions. COMMON BRAND NAME(S): Mycostatin, Nyamyc, Nyata, Nystop, Pedi-Dri What should I tell my health care provider before I take this medicine? They need to know if you have any of these conditions: -an unusual or allergic reaction to nystatin, other foods, dyes or preservatives -pregnant or trying to get pregnant -breast-feeding How should I use this medicine? This medicine is for external use on the skin only. Follow the directions on the prescription label. Dust the powder on the affected area (or into socks and shoes). If you are treating diaper rash, do not use tight-fitting diapers or plastic pants. Do not get the medicine in your eyes. If you do, rinse out with plenty of cool tap water. Do not breathe in the powder. Do not use your medicine more often than directed. Use your doses at regular intervals. Finish the full course prescribed by your doctor or health care professional even if you think your condition is better. Do not stop using except on the advice of your doctor or health care professional. Talk to your pediatrician regarding the use of this medicine in children. Special care may be needed. Overdosage: If you think you have taken too much of this medicine contact a poison control center or emergency room at once. NOTE: This medicine is only for you. Do not share this medicine with others. What if I miss a dose? If you miss a dose, use  it as soon as you can. If it is almost time for your next dose, use only that dose. Do not use double or extra doses. What may interact with this medicine? Interactions are not expected. Do not use any other skin products on the affected area without telling your doctor or health care professional. This list may not describe all possible interactions. Give your health care provider a list of all the medicines, herbs, non-prescription drugs, or dietary supplements you use. Also tell them if you smoke, drink alcohol, or use illegal drugs. Some items may interact with your medicine. What should I watch for while using this medicine? Tell your doctor or health care professional if your symptoms do not improve after 3 days. After bathing make sure that your skin is very dry. Fungal infections like moist conditions. Do not walk around barefoot. To help prevent reinfection, wear freshly washed cotton, not synthetic, clothing. What side effects may I notice from receiving this medicine? Side effects that you should report to your doctor or health care professional as soon as possible: -allergic reactions like skin rash, itching or hives, swelling of the face, lips, or tongue Side effects that usually do not require medical attention (report to your doctor or health care professional if they continue or are bothersome): -skin irritation This list may not describe all possible side effects. Call your doctor for medical advice about side effects. You may report side effects to FDA at 1-800-FDA-1088. Where should I keep my medicine?  Keep out of the reach of children. Store at room temperature between 15 and 30 degrees C (59 and 86 degrees F). Throw away any unused medicine after the expiration date. NOTE: This sheet is a summary. It may not cover all possible information. If you have questions about this medicine, talk to your doctor, pharmacist, or health care provider.  2017 Elsevier/Gold Standard  (2015-03-01 10:36:02)

## 2016-01-30 NOTE — Progress Notes (Signed)
Consult Note: Gyn-Onc  [name] 82 y.o. female  CC:  Chief Complaint  Patient presents with  . Ovarian Cancer    HPI: Patient is a 82 year old with stage IIIc primary peritoneal or ovarian carcinoma. Her illness began with several weeks of diarrhea and abdominal bloating the fall of 2012. She was tested several times for C. difficile was negative. Dr. Ronita Hipps at that time recommended an ultrasound and ultimately a CT scan. Ultrasound showed significant ascites and her CA 125 was markedly elevated which is when we saw her. CT imaging was most consistent with markedly advanced disease that would most likely preclude optimal debulking. This in conjunction with her poor performance status the decision was made to proceed with neoadjuvant chemotherapy. She had a high-volume paracentesis to confirm the diagnosis of a gynecologic malignancy.   She had 3 cycles of neoadjuvant chemotherapy under the care of Dr. Marko Plume with paclitaxel and carboplatin. Her CA 125 prior cycle #1 was 204 and on February 28, 2011 prior to cycle #2 her CA 125 was 12.6. She also had a CT scan that showed marked resolution of her ascites and marked improvement in the omental caking. There is no mention of any significant carcinomatosis. There is no significant adenopathy. The adnexa and uterus are within normal limits.  After discussion of pros and cons she opted for definitive surgical management. On 04/01/2011 she underwent surgery by Dr. Fermin Schwab. She underwent exploratory laparotomy TAH/BSO and omental resection. There was no gross evidence of disease. Pathology revealed no residual carcinoma within the omentum. There was stromal fibrosis and chronic inflammation and scattered psammoma bodies consistent with posttreatment effect. Within the right ovary and tube was no residual carcinoma. There was benign ovarian tissue with associated serosal adhesions necrosis and scattered psammoma bodies. This was seen on the  uterine serosa. The uterus was otherwise negative. The left tube and ovary also had associated psammoma bodies but no residual carcinoma identified. She did well postoperatively and after lengthy discussions underwent an additional 3 cycles of chemotherapy which she completed in 06-20-11. CA-125 at that time was 4.2 and on 07/23/11 it was 3.3. Subsequent CA-125's have been less than 10 with her last one being 6 9/16She has one pending from today.  The most notable history is that in August 2014 her husband fell while playing ping-pong and hit his head. He had a large intracerebral bleed and it is unclear whether he had a stroke, an aneurysm rupture or related to trauma. They decided to withdraw. They have been married more than 60 years. Her husband was about to turn 62. This is been very stressful for her.  Interval History: She comes in today for follow-up. I last saw her a year ago which time her CA-125 was around 6. There was a similar value when she saw Dr. Marko Plume. She is almost at her 5 year anniversary from her diagnosis which is excellent.  She is overall feeling very well. She states she lives living where she lives and is very happy and is feeling joint. The holidays. She stay she's also completely forgotten that she had a diagnosis of ovarian cancer and gets very disappointed when people tolerate her that "everyone with ovarian cancer dies". There was is really bothering her is that she has a yeast infection in her groin on the right. She also feels that her incisional hernias getting bigger but she does not wish to have surgery. She has a little bit of urinary incontinence for which she wears  a small pad.    Review of Systems:  Constitutional: Denies fever. She has lost some weight but she states that she is trying not to eat as many sweets at diessert time as she used to. Skin: No rash, +occasional odor under pannus, + yeast under right groin Cardiovascular: No chest pain, shortness of breath,  or edema  Pulmonary: No cough Gastro Intestinal:No nausea, vomiting, constipation, or diarrhea reported. No bright red blood per rectum or change in bowel movement. + hernia larger, no pain Genitourinary: No frequency, + urgency in the morning only she does not wake up at night to void, occassional incontinence, she wears a pad, no dysuria.  Denies vaginal bleeding and discharge.  Musculoskeletal: No myalgia, arthralgia, joint swelling or pain. Neurologic: No weakness Psychology: Feeling well and staying busy. Feeling joyful and happy  Current Meds:  Outpatient Encounter Prescriptions as of 01/30/2016  Medication Sig Dispense Refill  . azelastine (ASTELIN) 0.1 % nasal spray     . B Complex Vitamins (VITAMIN B COMPLEX PO)     . Ca Phosphate-Cholecalciferol (EQL VITAMIN D-3 HIGH POTENCY) [phone] MG-UNIT TABS 1 capsule    . Calcium Citrate-Vitamin D (CITRACAL + D PO) Take 600 mg by mouth 2 (two) times daily.     . Cholecalciferol (VITAMIN D) 2000 UNITS tablet Take 2,000 Units by mouth daily.     Marland Kitchen lisinopril (PRINIVIL,ZESTRIL) 10 MG tablet TAKE 1 TABLET BY MOUTH ONCE DAILY    . Red Yeast Rice 600 MG CAPS 1 tablet    . Multiple Vitamins-Minerals (CENTRUM SILVER 50+MEN PO) 1 tab    . nadolol (CORGARD) 20 MG tablet 1/2 tablet    . nystatin cream (MYCOSTATIN) Apply 1 application topically 3 (three) times daily as needed. Applies to Outside of vagina and anus for Itching (Patient not taking: Reported on 01/31/2015) 30 g 1  . nystatin-triamcinolone ointment (MYCOLOG) Apply topically 2 (two) times daily as needed. 30 g 1  . omega-3 acid ethyl esters (LOVAZA) 1 g capsule 1 tablet    . omeprazole (PRILOSEC) 20 MG capsule Take 20 mg by mouth Daily.    Marland Kitchen PATADAY 0.2 % SOLN as needed. Reported on 01/31/2015    . Polyethyl Glycol-Propyl Glycol (SYSTANE) 0.4-0.3 % SOLN 1 drop into affected eye as needed    . Ranibizumab (LUCENTIS IO) Place into the left eye once as needed. Reported on 01/31/2015    .  traMADol (ULTRAM) 50 MG tablet Take 50 mg by mouth every 6 (six) hours as needed.    . vitamin E 400 UNIT capsule 1 capsule    . zolpidem (AMBIEN) 10 MG tablet 1 tablet    . [DISCONTINUED] azelastine (ASTELIN) 137 MCG/SPRAY nasal spray Place 1 spray into the nose as needed. Use in each nostril as directed    . [DISCONTINUED] lisinopril (PRINIVIL,ZESTRIL) 10 MG tablet Take 10 mg by mouth daily after breakfast.     . [DISCONTINUED] Multiple Vitamins-Minerals (CENTRUM SILVER PO) Take 1 tablet by mouth daily.     . [DISCONTINUED] nadolol (CORGARD) 20 MG tablet Take 10 mg by mouth daily after breakfast.     . [DISCONTINUED] omega-3 acid ethyl esters (LOVAZA) 1 G capsule Take 1 g by mouth daily.    . [DISCONTINUED] Polyethyl Glycol-Propyl Glycol (SYSTANE) 0.4-0.3 % SOLN Place 1 drop into both eyes 4 (four) times daily.    . [DISCONTINUED] Red Yeast Rice Extract (RED YEAST RICE PO) Take 1 capsule by mouth 2 (two) times daily.    . [  DISCONTINUED] vitamin E (VITAMIN E) 400 UNIT capsule Take 400 Units by mouth daily.    . [DISCONTINUED] zolpidem (AMBIEN) 10 MG tablet Take 10 mg by mouth at bedtime.      No facility-administered encounter medications on file as of 01/30/2016.     Allergy:  Allergies  Allergen Reactions  . Morphine And Related     Given after knee replacement and code blue occurred  . Penicillins     REACTION: hives  . Shellfish Allergy     Pt has shellfish allergy only.  Has had IV contrast x 2 and did fine.    Social Hx:   Social History   Social History  . Marital status: Widowed    Spouse name: N/A  . Number of children: N/A  . Years of education: N/A   Occupational History  . Not on file.   Social History Main Topics  . Smoking status: Former Smoker    Quit date: 02/11/1952  . Smokeless tobacco: Never Used  . Alcohol use Not on file  . Drug use: Unknown  . Sexual activity: No   Other Topics Concern  . Not on file   Social History Narrative  . No narrative on  file    Past Surgical Hx:  Past Surgical History:  Procedure Laterality Date  . ABDOMINAL HYSTERECTOMY  04/01/2011   Procedure: HYSTERECTOMY ABDOMINAL;  Surgeon: Alvino Chapel, MD;  Location: [facility];  Service: Gynecology;  Laterality: N/A;  . APPENDECTOMY    . JOINT REPLACEMENT     R knee in 2008, 5 operations on L knee  . LAPAROTOMY  04/01/2011   Procedure: EXPLORATORY LAPAROTOMY;  Surgeon: Alvino Chapel, MD;  Location: [facility];  Service: Gynecology;  Laterality: N/A;  . OTHER SURGICAL HISTORY     hx of C section 1966  . SALPINGOOPHORECTOMY  04/01/2011   Procedure: SALPINGO OOPHERECTOMY;  Surgeon: Alvino Chapel, MD;  Location: [facility];  Service: Gynecology;  Laterality: Bilateral;  . TONSILLECTOMY      Past Medical Hx:  Past Medical History:  Diagnosis Date  . Anemia    hx of   . Anemia associated with acute blood loss 04/03/2011  . Arthritis    knees   . ARTHRITIS, SEPTIC 05/28/2009   Qualifier: Diagnosis of  By: Tommy Medal MD, Roderic Scarce    . Ascites   . Blood transfusion    hx of 2011   . Complication of anesthesia    Sodium drops per pt   . Cystitis   . DIARRHEA, ANTIBIOTIC ASSOCIATED 05/31/2009   Qualifier: Diagnosis of  By: Tommy Medal MD, Roderic Scarce    . GERD (gastroesophageal reflux disease)   . H/O hiatal hernia   . Hyperlipidemia   . Hypertension   . Hyponatremia   . Neutropenia with fever ([place]) 05/18/2011  . OSTEOMYELITIS, CHRONIC, LOWER LEG 04/23/2009   Qualifier: Diagnosis of  By: Johnnye Sima MD, Dellis Filbert    . Osteopenia   . OSTEOPOROSIS 04/23/2009   Qualifier: Diagnosis of  By: Johnnye Sima MD, Dellis Filbert    . Ovarian cancer ([place]) 01/25/2011  . Pneumonia    hx of   . Postmenopausal atrophic vaginitis   . PROSTHETIC JOINT COMPLICATION [id number]   Qualifier: Diagnosis of  By: Tommy Medal MD, Roderic Scarce    . Staph infection 2010   after knee replacement  . Urinary frequency   . Vulvitis     Oncology Hx:    Ovarian cancer ([place])   01/25/2011 Initial  Diagnosis    Ovarian cancer      01/27/2011 - 06/23/2011 Chemotherapy    6 cycles paclitaxel and carboplatin      04/01/2011 Surgery    optimal debulking post three cycles of neoadjuvant chemotherapy followed by three cycles of chemotherapy for a total of 6       Family Hx:  Family History  Problem Relation Age of Onset  . Cancer Other     Bladder cancer  . Heart attack Brother   . Diabetes Brother     Vitals:  Blood pressure (!) 159/84, pulse 71, temperature 97.5 F (36.4 C), temperature source Oral, resp. rate 18, height 5\' 3"  (1.6 m), weight 180 lb (81.6 kg), SpO2 98 %.  Physical Exam:  Well-nourished well-developed female in no acute distress.   Neck: Supple, no lymphadenopathy, no thyromegaly.   Lungs: Clear to auscultation bilaterally.   Cardiovascular: Regular rate and rhythm.   Abdomen: Well-healed vertical incision. She has a reducible, non tender incisional hernia towards the left side of her incision and a palpable 8-10 cm opening in the fascia is appreciated. Easily reducible. Abdomen is soft, nontender, nondistended.  Groins: No lymphadenopathy. + candida right groin  Extremities: 1+ trace edema.   Pelvic: Atrophic external genitalia. There were no palpable masses on bimanual examination. There is no nodularity. Rectal confirms.   Assessment/Plan:  68 -year-old diagnosed with advanced serous either primary peritoneal or ovarian carcinoma in December of 2012. Based on CT findings she was treated with neoadjuvant chemotherapy with carboplatin and paclitaxel from 01/27/2011 through March 10 2011. Her CA125 responded nicely as did her imaging. She had an optimal cytoreductive surgery by Dr. Fermin Schwab. Final pathology revealed no evidence of disease. In an additional 3 cycles of chemotherapy after her debulking surgery were given and she has had no evidence of disease since that time. Her clinical exam today is unremarkable. Her CA-125 from today is pending.     She is almost 5 years post her diagnosis. We will see her back in 6 months as Dr. Marko Plume is retiring. At that time, she can follow-up with her primary care physicians and get a CA-125 on an annual basis.  Nancy Marus A., MD 01/30/2016, 2:37 PM

## 2016-01-31 ENCOUNTER — Telehealth: Payer: Self-pay | Admitting: *Deleted

## 2016-01-31 DIAGNOSIS — L570 Actinic keratosis: Secondary | ICD-10-CM | POA: Diagnosis not present

## 2016-01-31 DIAGNOSIS — L821 Other seborrheic keratosis: Secondary | ICD-10-CM | POA: Diagnosis not present

## 2016-01-31 DIAGNOSIS — D1801 Hemangioma of skin and subcutaneous tissue: Secondary | ICD-10-CM | POA: Diagnosis not present

## 2016-01-31 DIAGNOSIS — L304 Erythema intertrigo: Secondary | ICD-10-CM | POA: Diagnosis not present

## 2016-01-31 DIAGNOSIS — D235 Other benign neoplasm of skin of trunk: Secondary | ICD-10-CM | POA: Diagnosis not present

## 2016-01-31 LAB — CANCER ANTIGEN 125 (PARALLEL TESTING): CA 125: 5 U/mL (ref <35)

## 2016-01-31 LAB — CA 125: Cancer Antigen (CA) 125: 7.8 U/mL (ref 0.0–38.1)

## 2016-01-31 NOTE — Telephone Encounter (Signed)
Notified pt of CA125 results  

## 2016-02-21 ENCOUNTER — Encounter: Payer: Self-pay | Admitting: Podiatry

## 2016-02-21 ENCOUNTER — Ambulatory Visit (INDEPENDENT_AMBULATORY_CARE_PROVIDER_SITE_OTHER): Payer: Medicare Other | Admitting: Podiatry

## 2016-02-21 DIAGNOSIS — M722 Plantar fascial fibromatosis: Secondary | ICD-10-CM | POA: Diagnosis not present

## 2016-02-21 NOTE — Progress Notes (Signed)
She presents today with a chief complaint of a painful forefoot right and she points to the first intermetatarsal space. Also complaining of a right painful heel. States that she's been given 3 injections in her back and hip for sciatica. She states it really hasn't improved yet. She states that she feels that the pain in her hip and back radiate to her foot.  Objective: Vital signs are stable she is alert and oriented 3. Pulses are palpable. She has pain on palpation to deep peroneal nerve between the first and second metatarsals right foot. The majority of her symptoms however located in the plantar fascia calcaneal insertion site of the right heel.  Assessment: Plantar fasciitis right foot. Deep peroneal nerve neuritis possibly associated with sciatica.  Plan: Discussed etiology and pathology conservative or surgical therapies. Injected her right heel today with Kenalog and local anesthetic follow-up with her in 2-3 weeks for reevaluation and another injection to the intermetatarsal space of necessary.

## 2016-03-03 DIAGNOSIS — H35352 Cystoid macular degeneration, left eye: Secondary | ICD-10-CM | POA: Diagnosis not present

## 2016-03-03 DIAGNOSIS — H34832 Tributary (branch) retinal vein occlusion, left eye, with macular edema: Secondary | ICD-10-CM | POA: Diagnosis not present

## 2016-03-10 DIAGNOSIS — H01022 Squamous blepharitis right lower eyelid: Secondary | ICD-10-CM | POA: Diagnosis not present

## 2016-03-10 DIAGNOSIS — H348322 Tributary (branch) retinal vein occlusion, left eye, stable: Secondary | ICD-10-CM | POA: Diagnosis not present

## 2016-03-10 DIAGNOSIS — Z961 Presence of intraocular lens: Secondary | ICD-10-CM | POA: Diagnosis not present

## 2016-03-10 DIAGNOSIS — H1851 Endothelial corneal dystrophy: Secondary | ICD-10-CM | POA: Diagnosis not present

## 2016-03-10 DIAGNOSIS — H40013 Open angle with borderline findings, low risk, bilateral: Secondary | ICD-10-CM | POA: Diagnosis not present

## 2016-03-10 DIAGNOSIS — H25811 Combined forms of age-related cataract, right eye: Secondary | ICD-10-CM | POA: Diagnosis not present

## 2016-03-10 DIAGNOSIS — H01025 Squamous blepharitis left lower eyelid: Secondary | ICD-10-CM | POA: Diagnosis not present

## 2016-03-10 DIAGNOSIS — H16223 Keratoconjunctivitis sicca, not specified as Sjogren's, bilateral: Secondary | ICD-10-CM | POA: Diagnosis not present

## 2016-03-10 DIAGNOSIS — H01024 Squamous blepharitis left upper eyelid: Secondary | ICD-10-CM | POA: Diagnosis not present

## 2016-03-10 DIAGNOSIS — H01021 Squamous blepharitis right upper eyelid: Secondary | ICD-10-CM | POA: Diagnosis not present

## 2016-04-07 DIAGNOSIS — M810 Age-related osteoporosis without current pathological fracture: Secondary | ICD-10-CM | POA: Diagnosis not present

## 2016-04-07 DIAGNOSIS — Z1231 Encounter for screening mammogram for malignant neoplasm of breast: Secondary | ICD-10-CM | POA: Diagnosis not present

## 2016-04-14 DIAGNOSIS — H35352 Cystoid macular degeneration, left eye: Secondary | ICD-10-CM | POA: Diagnosis not present

## 2016-04-14 DIAGNOSIS — H34832 Tributary (branch) retinal vein occlusion, left eye, with macular edema: Secondary | ICD-10-CM | POA: Diagnosis not present

## 2016-04-18 DIAGNOSIS — N189 Chronic kidney disease, unspecified: Secondary | ICD-10-CM | POA: Diagnosis not present

## 2016-04-18 DIAGNOSIS — I10 Essential (primary) hypertension: Secondary | ICD-10-CM | POA: Diagnosis not present

## 2016-04-18 DIAGNOSIS — E782 Mixed hyperlipidemia: Secondary | ICD-10-CM | POA: Diagnosis not present

## 2016-04-18 DIAGNOSIS — R899 Unspecified abnormal finding in specimens from other organs, systems and tissues: Secondary | ICD-10-CM | POA: Diagnosis not present

## 2016-04-18 DIAGNOSIS — M810 Age-related osteoporosis without current pathological fracture: Secondary | ICD-10-CM | POA: Diagnosis not present

## 2016-04-18 DIAGNOSIS — K219 Gastro-esophageal reflux disease without esophagitis: Secondary | ICD-10-CM | POA: Diagnosis not present

## 2016-04-22 ENCOUNTER — Ambulatory Visit: Payer: Medicare Other | Admitting: Podiatry

## 2016-05-05 ENCOUNTER — Other Ambulatory Visit: Payer: Self-pay | Admitting: Gastroenterology

## 2016-05-05 DIAGNOSIS — R1310 Dysphagia, unspecified: Secondary | ICD-10-CM

## 2016-05-05 DIAGNOSIS — R1314 Dysphagia, pharyngoesophageal phase: Secondary | ICD-10-CM | POA: Diagnosis not present

## 2016-05-05 DIAGNOSIS — K219 Gastro-esophageal reflux disease without esophagitis: Secondary | ICD-10-CM

## 2016-05-06 ENCOUNTER — Ambulatory Visit: Payer: Medicare Other | Admitting: Physician Assistant

## 2016-05-06 ENCOUNTER — Telehealth: Payer: Self-pay | Admitting: Physician Assistant

## 2016-05-09 ENCOUNTER — Ambulatory Visit
Admission: RE | Admit: 2016-05-09 | Discharge: 2016-05-09 | Disposition: A | Discharge status: Home / Self Care | Payer: Medicare Other | Source: Ambulatory Visit | Attending: Gastroenterology | Admitting: Gastroenterology

## 2016-05-09 DIAGNOSIS — K449 Diaphragmatic hernia without obstruction or gangrene: Secondary | ICD-10-CM | POA: Diagnosis not present

## 2016-05-09 DIAGNOSIS — K219 Gastro-esophageal reflux disease without esophagitis: Secondary | ICD-10-CM

## 2016-05-09 DIAGNOSIS — R1310 Dysphagia, unspecified: Secondary | ICD-10-CM

## 2016-05-15 DIAGNOSIS — Z79899 Other long term (current) drug therapy: Secondary | ICD-10-CM | POA: Diagnosis not present

## 2016-05-19 DIAGNOSIS — H34832 Tributary (branch) retinal vein occlusion, left eye, with macular edema: Secondary | ICD-10-CM | POA: Diagnosis not present

## 2016-05-28 DIAGNOSIS — H16223 Keratoconjunctivitis sicca, not specified as Sjogren's, bilateral: Secondary | ICD-10-CM | POA: Diagnosis not present

## 2016-05-28 DIAGNOSIS — H00025 Hordeolum internum left lower eyelid: Secondary | ICD-10-CM | POA: Diagnosis not present

## 2016-05-28 DIAGNOSIS — R1310 Dysphagia, unspecified: Secondary | ICD-10-CM | POA: Diagnosis not present

## 2016-05-28 DIAGNOSIS — H01021 Squamous blepharitis right upper eyelid: Secondary | ICD-10-CM | POA: Diagnosis not present

## 2016-05-28 DIAGNOSIS — H01025 Squamous blepharitis left lower eyelid: Secondary | ICD-10-CM | POA: Diagnosis not present

## 2016-05-28 DIAGNOSIS — H01022 Squamous blepharitis right lower eyelid: Secondary | ICD-10-CM | POA: Diagnosis not present

## 2016-05-28 DIAGNOSIS — H01024 Squamous blepharitis left upper eyelid: Secondary | ICD-10-CM | POA: Diagnosis not present

## 2016-05-29 ENCOUNTER — Ambulatory Visit (INDEPENDENT_AMBULATORY_CARE_PROVIDER_SITE_OTHER): Payer: Medicare Other | Admitting: Podiatry

## 2016-05-29 ENCOUNTER — Encounter: Payer: Self-pay | Admitting: Podiatry

## 2016-05-29 DIAGNOSIS — M722 Plantar fascial fibromatosis: Secondary | ICD-10-CM | POA: Diagnosis not present

## 2016-05-29 DIAGNOSIS — M792 Neuralgia and neuritis, unspecified: Secondary | ICD-10-CM | POA: Diagnosis not present

## 2016-05-29 NOTE — Progress Notes (Signed)
She presents today for a chief complaint of neuritis and plantar fasciitis. States is mostly sometimes.  Objective: Vital signs are stable she is alert and oriented 3 patient continued to roll of the right heel.  Assessment: Plantar fasciitis right.  Plan: Injected the right heel today. Follow up with her as needed.

## 2016-06-04 ENCOUNTER — Ambulatory Visit (HOSPITAL_COMMUNITY)
Admission: RE | Admit: 2016-06-04 | Discharge: 2016-06-04 | Disposition: A | Discharge status: Home / Self Care | Payer: Medicare Other | Source: Ambulatory Visit | Attending: Family Medicine | Admitting: Family Medicine

## 2016-06-05 DIAGNOSIS — N189 Chronic kidney disease, unspecified: Secondary | ICD-10-CM | POA: Diagnosis not present

## 2016-06-17 DIAGNOSIS — M810 Age-related osteoporosis without current pathological fracture: Secondary | ICD-10-CM | POA: Diagnosis not present

## 2016-06-23 DIAGNOSIS — H35352 Cystoid macular degeneration, left eye: Secondary | ICD-10-CM | POA: Diagnosis not present

## 2016-06-23 DIAGNOSIS — H26492 Other secondary cataract, left eye: Secondary | ICD-10-CM | POA: Diagnosis not present

## 2016-06-23 DIAGNOSIS — H34832 Tributary (branch) retinal vein occlusion, left eye, with macular edema: Secondary | ICD-10-CM | POA: Diagnosis not present

## 2016-06-23 DIAGNOSIS — H3562 Retinal hemorrhage, left eye: Secondary | ICD-10-CM | POA: Diagnosis not present

## 2016-07-10 DIAGNOSIS — R899 Unspecified abnormal finding in specimens from other organs, systems and tissues: Secondary | ICD-10-CM | POA: Diagnosis not present

## 2016-07-11 DIAGNOSIS — H01022 Squamous blepharitis right lower eyelid: Secondary | ICD-10-CM | POA: Diagnosis not present

## 2016-07-11 DIAGNOSIS — H25811 Combined forms of age-related cataract, right eye: Secondary | ICD-10-CM | POA: Diagnosis not present

## 2016-07-11 DIAGNOSIS — H348322 Tributary (branch) retinal vein occlusion, left eye, stable: Secondary | ICD-10-CM | POA: Diagnosis not present

## 2016-07-11 DIAGNOSIS — H40013 Open angle with borderline findings, low risk, bilateral: Secondary | ICD-10-CM | POA: Diagnosis not present

## 2016-07-11 DIAGNOSIS — H00025 Hordeolum internum left lower eyelid: Secondary | ICD-10-CM | POA: Diagnosis not present

## 2016-07-11 DIAGNOSIS — Z961 Presence of intraocular lens: Secondary | ICD-10-CM | POA: Diagnosis not present

## 2016-07-11 DIAGNOSIS — H01021 Squamous blepharitis right upper eyelid: Secondary | ICD-10-CM | POA: Diagnosis not present

## 2016-07-11 DIAGNOSIS — H01024 Squamous blepharitis left upper eyelid: Secondary | ICD-10-CM | POA: Diagnosis not present

## 2016-07-11 DIAGNOSIS — H1851 Endothelial corneal dystrophy: Secondary | ICD-10-CM | POA: Diagnosis not present

## 2016-07-11 DIAGNOSIS — H16223 Keratoconjunctivitis sicca, not specified as Sjogren's, bilateral: Secondary | ICD-10-CM | POA: Diagnosis not present

## 2016-07-11 DIAGNOSIS — H01025 Squamous blepharitis left lower eyelid: Secondary | ICD-10-CM | POA: Diagnosis not present

## 2016-08-05 ENCOUNTER — Other Ambulatory Visit: Payer: Self-pay | Admitting: Gynecologic Oncology

## 2016-08-05 DIAGNOSIS — C569 Malignant neoplasm of unspecified ovary: Secondary | ICD-10-CM

## 2016-08-06 ENCOUNTER — Encounter: Payer: Self-pay | Admitting: Gynecologic Oncology

## 2016-08-06 ENCOUNTER — Other Ambulatory Visit (HOSPITAL_BASED_OUTPATIENT_CLINIC_OR_DEPARTMENT_OTHER): Payer: Medicare Other

## 2016-08-06 ENCOUNTER — Ambulatory Visit: Payer: Medicare Other | Attending: Gynecologic Oncology | Admitting: Gynecologic Oncology

## 2016-08-06 VITALS — BP 155/80 | HR 73 | Temp 98.1°F | Resp 20 | Wt 180.0 lb

## 2016-08-06 DIAGNOSIS — Z9221 Personal history of antineoplastic chemotherapy: Secondary | ICD-10-CM

## 2016-08-06 DIAGNOSIS — C569 Malignant neoplasm of unspecified ovary: Secondary | ICD-10-CM

## 2016-08-06 DIAGNOSIS — Z885 Allergy status to narcotic agent status: Secondary | ICD-10-CM | POA: Diagnosis not present

## 2016-08-06 DIAGNOSIS — Z880 Allergy status to penicillin: Secondary | ICD-10-CM | POA: Diagnosis not present

## 2016-08-06 DIAGNOSIS — Z8052 Family history of malignant neoplasm of bladder: Secondary | ICD-10-CM | POA: Insufficient documentation

## 2016-08-06 DIAGNOSIS — Z833 Family history of diabetes mellitus: Secondary | ICD-10-CM | POA: Insufficient documentation

## 2016-08-06 DIAGNOSIS — Z8543 Personal history of malignant neoplasm of ovary: Secondary | ICD-10-CM | POA: Diagnosis not present

## 2016-08-06 DIAGNOSIS — E785 Hyperlipidemia, unspecified: Secondary | ICD-10-CM | POA: Insufficient documentation

## 2016-08-06 DIAGNOSIS — I10 Essential (primary) hypertension: Secondary | ICD-10-CM | POA: Insufficient documentation

## 2016-08-06 DIAGNOSIS — Z90710 Acquired absence of both cervix and uterus: Secondary | ICD-10-CM | POA: Diagnosis not present

## 2016-08-06 DIAGNOSIS — Z9889 Other specified postprocedural states: Secondary | ICD-10-CM | POA: Diagnosis not present

## 2016-08-06 DIAGNOSIS — M179 Osteoarthritis of knee, unspecified: Secondary | ICD-10-CM | POA: Diagnosis not present

## 2016-08-06 DIAGNOSIS — Z90722 Acquired absence of ovaries, bilateral: Secondary | ICD-10-CM | POA: Diagnosis not present

## 2016-08-06 DIAGNOSIS — D62 Acute posthemorrhagic anemia: Secondary | ICD-10-CM | POA: Insufficient documentation

## 2016-08-06 DIAGNOSIS — Z91013 Allergy to seafood: Secondary | ICD-10-CM | POA: Diagnosis not present

## 2016-08-06 DIAGNOSIS — M810 Age-related osteoporosis without current pathological fracture: Secondary | ICD-10-CM | POA: Insufficient documentation

## 2016-08-06 DIAGNOSIS — Z96651 Presence of right artificial knee joint: Secondary | ICD-10-CM | POA: Diagnosis not present

## 2016-08-06 DIAGNOSIS — Z8249 Family history of ischemic heart disease and other diseases of the circulatory system: Secondary | ICD-10-CM | POA: Diagnosis not present

## 2016-08-06 DIAGNOSIS — Z87891 Personal history of nicotine dependence: Secondary | ICD-10-CM | POA: Insufficient documentation

## 2016-08-06 DIAGNOSIS — K219 Gastro-esophageal reflux disease without esophagitis: Secondary | ICD-10-CM | POA: Insufficient documentation

## 2016-08-06 NOTE — Progress Notes (Signed)
Consult Note: Gyn-Onc  [name] 83 y.o. female  CC:  Chief Complaint  Patient presents with  . 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 Her illness began with several weeks of diarrhea and abdominal bloating the fall of 2012. She was tested several times for C. difficile was negative. Dr. Ronita Hipps at that time recommended an ultrasound and ultimately a CT scan. Ultrasound showed significant ascites and her CA 125 was markedly elevated which is when we saw her. CT imaging was most consistent with markedly advanced disease that would most likely preclude optimal debulking. This in conjunction with her poor performance status the decision was made to proceed with neoadjuvant chemotherapy. She had a high-volume paracentesis to confirm the diagnosis of a gynecologic malignancy.   She had 3 cycles of neoadjuvant chemotherapy under the care of Dr. Marko Plume with paclitaxel and carboplatin. Her CA 125 prior cycle #1 was 204 and on February 28, 2011 prior to cycle #2 her CA 125 was 12.6. She also had a CT scan that showed marked resolution of her ascites and marked improvement in the omental caking. There is no mention of any significant carcinomatosis. There is no significant adenopathy. The adnexa and uterus are within normal limits.  After discussion of pros and cons she opted for definitive surgical management. On 04/01/2011 she underwent surgery by Dr. Fermin Schwab. She underwent exploratory laparotomy TAH/BSO and omental resection. There was no gross evidence of disease. Pathology revealed no residual carcinoma within the omentum. There was stromal fibrosis and chronic inflammation and scattered psammoma bodies consistent with posttreatment effect. Within the right ovary and tube was no residual carcinoma. There was benign ovarian tissue with associated serosal adhesions necrosis and scattered psammoma bodies. This was seen on the  uterine serosa. The uterus was otherwise negative. The left tube and ovary also had associated psammoma bodies but no residual carcinoma identified. She did well postoperatively and after lengthy discussions underwent an additional 3 cycles of chemotherapy which she completed in 06-20-11. CA-125 at that time was 4.2 and on 07/23/11 it was 3.3. Subsequent CA-125's have been less than 10 with her last one being 6 9/16She has one pending from today.  The most notable history is that in August 2014 her husband fell while playing ping-pong and hit his head. He had a large intracerebral bleed and it is unclear whether he had a stroke, an aneurysm rupture or related to trauma. They decided to withdraw. They have been married more than 60 years. Her husband was about to turn 85. This is been very stressful for her.  Interval History: She comes in today for follow-up. I last saw her in December 2017. At that time her exam was unremarkable and her CA-125 was 5. She comes in today for follow-up. She overall states that she's feeling very good and strong. She's going to be celebrating her 87th birthday and is having a party for over 100 people. She is complaining of questionable yeast and irritation. It seems to be primarily in the mons area. She does wear pad secondary to incontinence but even when she is dry she still wears a pad for fear of incontinence so I think it is most likely consistent with yeast secondary to persistent moisture and warmth. She states she's been no change in her her hernia. She is drinking more water. Apparently her creatinine was up a little bit and this is followed by Dr. Rex Kras. She otherwise  has no complaints. We did discuss that it is her 5 year anniversary we will get a release her from our clinic. However she would feel much more comfortable. He continued to see her at least once a year. We'll be happy to accommodate this request. She continues to not want anything done with the hernia  particularly as it does not appear to be getting larger and is not bothering her.    Review of Systems:  Constitutional: Denies fever.  Skin: No rash, +occasional odor under pannus, + yeast on mons Cardiovascular: No chest pain, shortness of breath, or edema  Pulmonary: No cough Gastro Intestinal:No nausea, vomiting, constipation, or diarrhea reported. No bright red blood per rectum or change in bowel movement. + hernia about the same size, no pain Genitourinary: No frequency, + urgency in the morning only she does not wake up at night to void, occassional incontinence, she wears a pad, no dysuria.  Denies vaginal bleeding and discharge.  Musculoskeletal: No myalgia, arthralgia, joint swelling or pain. Neurologic: No weakness Psychology: Feeling well and staying busy. Feeling strong.  Current Meds:  Outpatient Encounter Prescriptions as of 08/06/2016  Medication Sig  . azelastine (ASTELIN) 0.1 % nasal spray   . B Complex Vitamins (VITAMIN B COMPLEX PO)   . Ca Phosphate-Cholecalciferol (EQL VITAMIN D-3 HIGH POTENCY) [phone] MG-UNIT TABS 1 capsule  . Calcium Citrate-Vitamin D (CITRACAL + D PO) Take 600 mg by mouth 2 (two) times daily.   . Cholecalciferol (VITAMIN D) 2000 UNITS tablet Take 2,000 Units by mouth daily.   Marland Kitchen lisinopril (PRINIVIL,ZESTRIL) 10 MG tablet TAKE 1 TABLET BY MOUTH ONCE DAILY  . Multiple Vitamins-Minerals (CENTRUM SILVER 50+MEN PO) 1 tab  . nadolol (CORGARD) 20 MG tablet 1/2 tablet  . nystatin (MYCOSTATIN/NYSTOP) powder Apply topically 4 (four) times daily.  Marland Kitchen nystatin cream (MYCOSTATIN) Apply 1 application topically 3 (three) times daily as needed. Applies to Outside of vagina and anus for Itching  . nystatin-triamcinolone ointment (MYCOLOG) Apply topically 2 (two) times daily as needed.  Marland Kitchen omega-3 acid ethyl esters (LOVAZA) 1 g capsule 1 tablet  . omeprazole (PRILOSEC) 20 MG capsule Take 20 mg by mouth Daily.  Marland Kitchen PATADAY 0.2 % SOLN as needed. Reported on 01/31/2015   . Polyethyl Glycol-Propyl Glycol (SYSTANE) 0.4-0.3 % SOLN 1 drop into affected eye as needed  . Ranibizumab (LUCENTIS IO) Place into the left eye once as needed. Reported on 01/31/2015  . Red Yeast Rice 600 MG CAPS 1 tablet  . traMADol (ULTRAM) 50 MG tablet Take 50 mg by mouth every 6 (six) hours as needed.  . vitamin E 400 UNIT capsule 1 capsule  . zolpidem (AMBIEN) 10 MG tablet 1 tablet   No facility-administered encounter medications on file as of 08/06/2016.     Allergy:  Allergies  Allergen Reactions  . Morphine And Related     Given after knee replacement and code blue occurred  . Penicillins     REACTION: hives  . Shellfish Allergy     Pt has shellfish allergy only.  Has had IV contrast x 2 and did fine.    Social Hx:   Social History   Social History  . Marital status: Widowed    Spouse name: N/A  . Number of children: N/A  . Years of education: N/A   Occupational History  . Not on file.   Social History Main Topics  . Smoking status: Former Smoker    Quit date: 02/11/1952  . Smokeless tobacco: Never  Used  . Alcohol use Not on file  . Drug use: Unknown  . Sexual activity: No   Other Topics Concern  . Not on file   Social History Narrative  . No narrative on file    Past Surgical Hx:  Past Surgical History:  Procedure Laterality Date  . ABDOMINAL HYSTERECTOMY  04/01/2011   Procedure: HYSTERECTOMY ABDOMINAL;  Surgeon: Alvino Chapel, MD;  Location: [facility];  Service: Gynecology;  Laterality: N/A;  . APPENDECTOMY    . JOINT REPLACEMENT     R knee in 2008, 5 operations on L knee  . LAPAROTOMY  04/01/2011   Procedure: EXPLORATORY LAPAROTOMY;  Surgeon: Alvino Chapel, MD;  Location: [facility];  Service: Gynecology;  Laterality: N/A;  . OTHER SURGICAL HISTORY     hx of C section 1966  . SALPINGOOPHORECTOMY  04/01/2011   Procedure: SALPINGO OOPHERECTOMY;  Surgeon: Alvino Chapel, MD;  Location: [facility];  Service: Gynecology;  Laterality:  Bilateral;  . TONSILLECTOMY      Past Medical Hx:  Past Medical History:  Diagnosis Date  . Anemia    hx of   . Anemia associated with acute blood loss 04/03/2011  . Arthritis    knees   . ARTHRITIS, SEPTIC 05/28/2009   Qualifier: Diagnosis of  By: Tommy Medal MD, Roderic Scarce    . Ascites   . Blood transfusion    hx of 2011   . Complication of anesthesia    Sodium drops per pt   . Cystitis   . DIARRHEA, ANTIBIOTIC ASSOCIATED 05/31/2009   Qualifier: Diagnosis of  By: Tommy Medal MD, Roderic Scarce    . GERD (gastroesophageal reflux disease)   . H/O hiatal hernia   . Hyperlipidemia   . Hypertension   . Hyponatremia   . Neutropenia with fever ([place]) 05/18/2011  . OSTEOMYELITIS, CHRONIC, LOWER LEG 04/23/2009   Qualifier: Diagnosis of  By: Johnnye Sima MD, Dellis Filbert    . Osteopenia   . OSTEOPOROSIS 04/23/2009   Qualifier: Diagnosis of  By: Johnnye Sima MD, Dellis Filbert    . Ovarian cancer ([place]) 01/25/2011  . Pneumonia    hx of   . Postmenopausal atrophic vaginitis   . PROSTHETIC JOINT COMPLICATION 8/93/8101   Qualifier: Diagnosis of  By: Tommy Medal MD, Roderic Scarce    . Staph infection 2010   after knee replacement  . Urinary frequency   . Vulvitis     Oncology Hx:    Ovarian cancer ([place])   01/25/2011 Initial Diagnosis    Ovarian cancer      01/27/2011 - 06/23/2011 Chemotherapy    6 cycles paclitaxel and carboplatin      04/01/2011 Surgery    optimal debulking post three cycles of neoadjuvant chemotherapy followed by three cycles of chemotherapy for a total of 6       Family Hx:  Family History  Problem Relation Age of Onset  . Cancer Other        Bladder cancer  . Heart attack Brother   . Diabetes Brother     Vitals:  Blood pressure (!) 155/80, pulse 73, temperature 98.1 F (36.7 C), resp. rate 20, weight 180 lb (81.6 kg), SpO2 100 %.  Physical Exam:  Well-nourished well-developed female in no acute distress.   Neck: Supple, no lymphadenopathy, no thyromegaly.   Lungs: Clear to  auscultation bilaterally.   Cardiovascular: Regular rate and rhythm.   Abdomen: Well-healed vertical incision. She has a reducible, non tender incisional hernia towards the left side of  her incision and a palpable 8-10 cm opening in the fascia is appreciated. Easily reducible. Abdomen is soft, nontender, nondistended.  Groins: No lymphadenopathy.   Extremities: 1+ trace edema.   Pelvic: Atrophic external genitalia. + candida throughout mons and labia majora, small skin tag left labia majora. There were no palpable masses on bimanual examination. There is no nodularity. Rectal confirms.   Assessment/Plan:  8 -year-old diagnosed with advanced serous either primary peritoneal or ovarian carcinoma in December of 2012. Based on CT findings she was treated with neoadjuvant chemotherapy with carboplatin and paclitaxel from 01/27/2011 through May 2013. Her CA125 responded nicely as did her imaging. She had an optimal cytoreductive surgery by Dr. Fermin Schwab. Final pathology revealed no evidence of disease. In an additional 3 cycles of chemotherapy after her debulking surgery were given and she has had no evidence of disease since that time. Her clinical exam today is unremarkable. Her CA-125 from today is pending.    She is >5 years post her diagnosis. We will follow-up in results of her CA-125 from today. If it is normal, she could follow-up with her primary care physicians and get a CA-125 on an annual basis. However, she is most comfortable having her follow up with [place] on a yearly basis. She'll return to see [place] in one year or when necessary.  Jeson Camacho A., MD 08/06/2016, 3:23 PM

## 2016-08-06 NOTE — Patient Instructions (Signed)
Call in January of 2019 to [phone] to schedule an appointment for June of 2019.

## 2016-08-07 ENCOUNTER — Telehealth: Payer: Self-pay

## 2016-08-07 LAB — CA 125: Cancer Antigen (CA) 125: 7.9 U/mL (ref 0.0–38.1)

## 2016-08-07 NOTE — Telephone Encounter (Signed)
LM for Ms [name] stating results of the CA-125 as noted below by Joylene John, NP.

## 2016-08-07 NOTE — Telephone Encounter (Signed)
-----   Message from Dorothyann Gibbs, NP sent at 08/07/2016  9:33 AM EDT ----- Please let her know her CA 125 is normal and stable.  Thank you Melissa ----- Message ----- From: Interface, Lab In [facility] One Sent: 08/07/2016   8:43 AM To: Dorothyann Gibbs, NP

## 2016-08-11 DIAGNOSIS — H43812 Vitreous degeneration, left eye: Secondary | ICD-10-CM | POA: Diagnosis not present

## 2016-08-11 DIAGNOSIS — H348322 Tributary (branch) retinal vein occlusion, left eye, stable: Secondary | ICD-10-CM | POA: Diagnosis not present

## 2016-08-11 DIAGNOSIS — H26492 Other secondary cataract, left eye: Secondary | ICD-10-CM | POA: Diagnosis not present

## 2016-08-11 DIAGNOSIS — H4720 Unspecified optic atrophy: Secondary | ICD-10-CM | POA: Diagnosis not present

## 2016-08-11 DIAGNOSIS — H3562 Retinal hemorrhage, left eye: Secondary | ICD-10-CM | POA: Diagnosis not present

## 2016-09-29 DIAGNOSIS — H16223 Keratoconjunctivitis sicca, not specified as Sjogren's, bilateral: Secondary | ICD-10-CM | POA: Diagnosis not present

## 2016-09-29 DIAGNOSIS — H01022 Squamous blepharitis right lower eyelid: Secondary | ICD-10-CM | POA: Diagnosis not present

## 2016-09-29 DIAGNOSIS — H01021 Squamous blepharitis right upper eyelid: Secondary | ICD-10-CM | POA: Diagnosis not present

## 2016-09-29 DIAGNOSIS — H348322 Tributary (branch) retinal vein occlusion, left eye, stable: Secondary | ICD-10-CM | POA: Diagnosis not present

## 2016-09-29 DIAGNOSIS — H01025 Squamous blepharitis left lower eyelid: Secondary | ICD-10-CM | POA: Diagnosis not present

## 2016-09-29 DIAGNOSIS — H00025 Hordeolum internum left lower eyelid: Secondary | ICD-10-CM | POA: Diagnosis not present

## 2016-09-29 DIAGNOSIS — H2101 Hyphema, right eye: Secondary | ICD-10-CM | POA: Diagnosis not present

## 2016-09-29 DIAGNOSIS — Z961 Presence of intraocular lens: Secondary | ICD-10-CM | POA: Diagnosis not present

## 2016-09-29 DIAGNOSIS — H01024 Squamous blepharitis left upper eyelid: Secondary | ICD-10-CM | POA: Diagnosis not present

## 2016-09-29 DIAGNOSIS — H25811 Combined forms of age-related cataract, right eye: Secondary | ICD-10-CM | POA: Diagnosis not present

## 2016-09-29 DIAGNOSIS — H40013 Open angle with borderline findings, low risk, bilateral: Secondary | ICD-10-CM | POA: Diagnosis not present

## 2016-09-29 DIAGNOSIS — H1851 Endothelial corneal dystrophy: Secondary | ICD-10-CM | POA: Diagnosis not present

## 2016-10-06 DIAGNOSIS — H2101 Hyphema, right eye: Secondary | ICD-10-CM | POA: Diagnosis not present

## 2016-10-08 DIAGNOSIS — H26492 Other secondary cataract, left eye: Secondary | ICD-10-CM | POA: Diagnosis not present

## 2016-10-08 DIAGNOSIS — S0591XA Unspecified injury of right eye and orbit, initial encounter: Secondary | ICD-10-CM | POA: Diagnosis not present

## 2016-10-08 DIAGNOSIS — H348322 Tributary (branch) retinal vein occlusion, left eye, stable: Secondary | ICD-10-CM | POA: Diagnosis not present

## 2016-10-08 DIAGNOSIS — H3562 Retinal hemorrhage, left eye: Secondary | ICD-10-CM | POA: Diagnosis not present

## 2016-10-14 DIAGNOSIS — H1851 Endothelial corneal dystrophy: Secondary | ICD-10-CM | POA: Diagnosis not present

## 2016-10-14 DIAGNOSIS — H01025 Squamous blepharitis left lower eyelid: Secondary | ICD-10-CM | POA: Diagnosis not present

## 2016-10-14 DIAGNOSIS — Z961 Presence of intraocular lens: Secondary | ICD-10-CM | POA: Diagnosis not present

## 2016-10-14 DIAGNOSIS — H01022 Squamous blepharitis right lower eyelid: Secondary | ICD-10-CM | POA: Diagnosis not present

## 2016-10-14 DIAGNOSIS — H16223 Keratoconjunctivitis sicca, not specified as Sjogren's, bilateral: Secondary | ICD-10-CM | POA: Diagnosis not present

## 2016-10-14 DIAGNOSIS — H2101 Hyphema, right eye: Secondary | ICD-10-CM | POA: Diagnosis not present

## 2016-10-14 DIAGNOSIS — H01021 Squamous blepharitis right upper eyelid: Secondary | ICD-10-CM | POA: Diagnosis not present

## 2016-10-14 DIAGNOSIS — H25811 Combined forms of age-related cataract, right eye: Secondary | ICD-10-CM | POA: Diagnosis not present

## 2016-10-14 DIAGNOSIS — H01024 Squamous blepharitis left upper eyelid: Secondary | ICD-10-CM | POA: Diagnosis not present

## 2016-10-21 DIAGNOSIS — L570 Actinic keratosis: Secondary | ICD-10-CM | POA: Diagnosis not present

## 2016-10-21 DIAGNOSIS — L814 Other melanin hyperpigmentation: Secondary | ICD-10-CM | POA: Diagnosis not present

## 2016-10-21 DIAGNOSIS — L821 Other seborrheic keratosis: Secondary | ICD-10-CM | POA: Diagnosis not present

## 2016-11-05 DIAGNOSIS — M899 Disorder of bone, unspecified: Secondary | ICD-10-CM | POA: Diagnosis not present

## 2016-11-05 DIAGNOSIS — I10 Essential (primary) hypertension: Secondary | ICD-10-CM | POA: Diagnosis not present

## 2016-11-06 DIAGNOSIS — N189 Chronic kidney disease, unspecified: Secondary | ICD-10-CM | POA: Diagnosis not present

## 2016-11-06 DIAGNOSIS — K219 Gastro-esophageal reflux disease without esophagitis: Secondary | ICD-10-CM | POA: Diagnosis not present

## 2016-11-06 DIAGNOSIS — E669 Obesity, unspecified: Secondary | ICD-10-CM | POA: Diagnosis not present

## 2016-11-06 DIAGNOSIS — Z6832 Body mass index (BMI) 32.0-32.9, adult: Secondary | ICD-10-CM | POA: Diagnosis not present

## 2016-11-06 DIAGNOSIS — Z23 Encounter for immunization: Secondary | ICD-10-CM | POA: Diagnosis not present

## 2016-11-06 DIAGNOSIS — Z0000 Encounter for general adult medical examination without abnormal findings: Secondary | ICD-10-CM | POA: Diagnosis not present

## 2016-11-06 DIAGNOSIS — I10 Essential (primary) hypertension: Secondary | ICD-10-CM | POA: Diagnosis not present

## 2016-11-06 DIAGNOSIS — Z8543 Personal history of malignant neoplasm of ovary: Secondary | ICD-10-CM | POA: Diagnosis not present

## 2016-11-06 DIAGNOSIS — G8929 Other chronic pain: Secondary | ICD-10-CM | POA: Diagnosis not present

## 2016-11-06 DIAGNOSIS — M810 Age-related osteoporosis without current pathological fracture: Secondary | ICD-10-CM | POA: Diagnosis not present

## 2016-11-06 DIAGNOSIS — E782 Mixed hyperlipidemia: Secondary | ICD-10-CM | POA: Diagnosis not present

## 2016-11-06 DIAGNOSIS — G4700 Insomnia, unspecified: Secondary | ICD-10-CM | POA: Diagnosis not present

## 2016-11-11 DIAGNOSIS — H01025 Squamous blepharitis left lower eyelid: Secondary | ICD-10-CM | POA: Diagnosis not present

## 2016-11-11 DIAGNOSIS — H16223 Keratoconjunctivitis sicca, not specified as Sjogren's, bilateral: Secondary | ICD-10-CM | POA: Diagnosis not present

## 2016-11-11 DIAGNOSIS — H2101 Hyphema, right eye: Secondary | ICD-10-CM | POA: Diagnosis not present

## 2016-11-11 DIAGNOSIS — H1851 Endothelial corneal dystrophy: Secondary | ICD-10-CM | POA: Diagnosis not present

## 2016-11-11 DIAGNOSIS — H25811 Combined forms of age-related cataract, right eye: Secondary | ICD-10-CM | POA: Diagnosis not present

## 2016-11-11 DIAGNOSIS — H2511 Age-related nuclear cataract, right eye: Secondary | ICD-10-CM | POA: Diagnosis not present

## 2016-11-11 DIAGNOSIS — H01024 Squamous blepharitis left upper eyelid: Secondary | ICD-10-CM | POA: Diagnosis not present

## 2016-11-11 DIAGNOSIS — H01022 Squamous blepharitis right lower eyelid: Secondary | ICD-10-CM | POA: Diagnosis not present

## 2016-11-11 DIAGNOSIS — H00025 Hordeolum internum left lower eyelid: Secondary | ICD-10-CM | POA: Diagnosis not present

## 2016-11-11 DIAGNOSIS — H01021 Squamous blepharitis right upper eyelid: Secondary | ICD-10-CM | POA: Diagnosis not present

## 2016-11-11 DIAGNOSIS — H40013 Open angle with borderline findings, low risk, bilateral: Secondary | ICD-10-CM | POA: Diagnosis not present

## 2016-11-11 DIAGNOSIS — H348322 Tributary (branch) retinal vein occlusion, left eye, stable: Secondary | ICD-10-CM | POA: Diagnosis not present

## 2016-12-10 DIAGNOSIS — H209 Unspecified iridocyclitis: Secondary | ICD-10-CM | POA: Diagnosis not present

## 2016-12-10 DIAGNOSIS — H3562 Retinal hemorrhage, left eye: Secondary | ICD-10-CM | POA: Diagnosis not present

## 2016-12-10 DIAGNOSIS — H43812 Vitreous degeneration, left eye: Secondary | ICD-10-CM | POA: Diagnosis not present

## 2016-12-10 DIAGNOSIS — H26492 Other secondary cataract, left eye: Secondary | ICD-10-CM | POA: Diagnosis not present

## 2016-12-10 DIAGNOSIS — H348322 Tributary (branch) retinal vein occlusion, left eye, stable: Secondary | ICD-10-CM | POA: Diagnosis not present

## 2016-12-19 DIAGNOSIS — M810 Age-related osteoporosis without current pathological fracture: Secondary | ICD-10-CM | POA: Diagnosis not present

## 2016-12-24 DIAGNOSIS — L814 Other melanin hyperpigmentation: Secondary | ICD-10-CM | POA: Diagnosis not present

## 2016-12-24 DIAGNOSIS — L821 Other seborrheic keratosis: Secondary | ICD-10-CM | POA: Diagnosis not present

## 2016-12-24 DIAGNOSIS — L570 Actinic keratosis: Secondary | ICD-10-CM | POA: Diagnosis not present

## 2017-01-10 DIAGNOSIS — N390 Urinary tract infection, site not specified: Secondary | ICD-10-CM | POA: Diagnosis not present

## 2017-01-10 DIAGNOSIS — R300 Dysuria: Secondary | ICD-10-CM | POA: Diagnosis not present

## 2017-01-21 DIAGNOSIS — E669 Obesity, unspecified: Secondary | ICD-10-CM | POA: Diagnosis not present

## 2017-01-21 DIAGNOSIS — N390 Urinary tract infection, site not specified: Secondary | ICD-10-CM | POA: Diagnosis not present

## 2017-01-21 DIAGNOSIS — Z6832 Body mass index (BMI) 32.0-32.9, adult: Secondary | ICD-10-CM | POA: Diagnosis not present

## 2017-01-21 DIAGNOSIS — I10 Essential (primary) hypertension: Secondary | ICD-10-CM | POA: Diagnosis not present

## 2017-02-12 DIAGNOSIS — M9905 Segmental and somatic dysfunction of pelvic region: Secondary | ICD-10-CM | POA: Diagnosis not present

## 2017-02-12 DIAGNOSIS — M5136 Other intervertebral disc degeneration, lumbar region: Secondary | ICD-10-CM | POA: Diagnosis not present

## 2017-02-12 DIAGNOSIS — M9904 Segmental and somatic dysfunction of sacral region: Secondary | ICD-10-CM | POA: Diagnosis not present

## 2017-02-12 DIAGNOSIS — M9903 Segmental and somatic dysfunction of lumbar region: Secondary | ICD-10-CM | POA: Diagnosis not present

## 2017-02-13 DIAGNOSIS — M5136 Other intervertebral disc degeneration, lumbar region: Secondary | ICD-10-CM | POA: Diagnosis not present

## 2017-02-13 DIAGNOSIS — M9904 Segmental and somatic dysfunction of sacral region: Secondary | ICD-10-CM | POA: Diagnosis not present

## 2017-02-13 DIAGNOSIS — M9903 Segmental and somatic dysfunction of lumbar region: Secondary | ICD-10-CM | POA: Diagnosis not present

## 2017-02-13 DIAGNOSIS — M9905 Segmental and somatic dysfunction of pelvic region: Secondary | ICD-10-CM | POA: Diagnosis not present

## 2017-02-16 DIAGNOSIS — M5136 Other intervertebral disc degeneration, lumbar region: Secondary | ICD-10-CM | POA: Diagnosis not present

## 2017-02-16 DIAGNOSIS — M9905 Segmental and somatic dysfunction of pelvic region: Secondary | ICD-10-CM | POA: Diagnosis not present

## 2017-02-16 DIAGNOSIS — M9904 Segmental and somatic dysfunction of sacral region: Secondary | ICD-10-CM | POA: Diagnosis not present

## 2017-02-16 DIAGNOSIS — M9903 Segmental and somatic dysfunction of lumbar region: Secondary | ICD-10-CM | POA: Diagnosis not present

## 2017-02-18 DIAGNOSIS — L570 Actinic keratosis: Secondary | ICD-10-CM | POA: Diagnosis not present

## 2017-02-18 DIAGNOSIS — L821 Other seborrheic keratosis: Secondary | ICD-10-CM | POA: Diagnosis not present

## 2017-02-19 ENCOUNTER — Ambulatory Visit (INDEPENDENT_AMBULATORY_CARE_PROVIDER_SITE_OTHER): Payer: Medicare Other | Admitting: Podiatry

## 2017-02-19 ENCOUNTER — Encounter: Payer: Self-pay | Admitting: Podiatry

## 2017-02-19 DIAGNOSIS — M792 Neuralgia and neuritis, unspecified: Secondary | ICD-10-CM

## 2017-02-19 DIAGNOSIS — M7751 Other enthesopathy of right foot: Secondary | ICD-10-CM | POA: Diagnosis not present

## 2017-02-19 NOTE — Progress Notes (Signed)
She presents today with a follow-up of her plantar fasciitis and neuritis to her right foot.  States that had pain right 3 years she points to the top and medial aspect of the first metatarsophalangeal joint back to the level of the first metatarsal tarsal articulation.  She states that thought maybe I could get a shot since that seems to help everything else.  She states that her plantar fasciitis is doing much better at this point.  Objective: Vital signs are stable she is alert and oriented x3 there is no erythema edema cellulitis drainage or odor she has pain on palpation of the extensor hallucis longus tendon as it passes over a dorsal spur at the level of the first tarsometatarsal joint.  She also has some neuritis of the deep peroneal nerve in this area.  Assessment: Osteoarthritis tendinitis EHL and neuritis of the deep peroneal nerve.  Plan: I injected the area today after sterile Betadine skin prep and verbal permission with 20 mg of Kenalog 5 mg of Marcaine to the point of maximal tenderness.

## 2017-02-20 DIAGNOSIS — M9904 Segmental and somatic dysfunction of sacral region: Secondary | ICD-10-CM | POA: Diagnosis not present

## 2017-02-20 DIAGNOSIS — M5136 Other intervertebral disc degeneration, lumbar region: Secondary | ICD-10-CM | POA: Diagnosis not present

## 2017-02-20 DIAGNOSIS — M9905 Segmental and somatic dysfunction of pelvic region: Secondary | ICD-10-CM | POA: Diagnosis not present

## 2017-02-20 DIAGNOSIS — M9903 Segmental and somatic dysfunction of lumbar region: Secondary | ICD-10-CM | POA: Diagnosis not present

## 2017-02-25 DIAGNOSIS — M9903 Segmental and somatic dysfunction of lumbar region: Secondary | ICD-10-CM | POA: Diagnosis not present

## 2017-02-25 DIAGNOSIS — M9904 Segmental and somatic dysfunction of sacral region: Secondary | ICD-10-CM | POA: Diagnosis not present

## 2017-02-25 DIAGNOSIS — M5136 Other intervertebral disc degeneration, lumbar region: Secondary | ICD-10-CM | POA: Diagnosis not present

## 2017-02-25 DIAGNOSIS — M9905 Segmental and somatic dysfunction of pelvic region: Secondary | ICD-10-CM | POA: Diagnosis not present

## 2017-03-02 DIAGNOSIS — M9904 Segmental and somatic dysfunction of sacral region: Secondary | ICD-10-CM | POA: Diagnosis not present

## 2017-03-02 DIAGNOSIS — M9903 Segmental and somatic dysfunction of lumbar region: Secondary | ICD-10-CM | POA: Diagnosis not present

## 2017-03-02 DIAGNOSIS — M5136 Other intervertebral disc degeneration, lumbar region: Secondary | ICD-10-CM | POA: Diagnosis not present

## 2017-03-02 DIAGNOSIS — M9905 Segmental and somatic dysfunction of pelvic region: Secondary | ICD-10-CM | POA: Diagnosis not present

## 2017-03-05 DIAGNOSIS — Z124 Encounter for screening for malignant neoplasm of cervix: Secondary | ICD-10-CM | POA: Diagnosis not present

## 2017-03-05 DIAGNOSIS — R32 Unspecified urinary incontinence: Secondary | ICD-10-CM | POA: Diagnosis not present

## 2017-03-05 DIAGNOSIS — C569 Malignant neoplasm of unspecified ovary: Secondary | ICD-10-CM | POA: Diagnosis not present

## 2017-03-09 DIAGNOSIS — M9904 Segmental and somatic dysfunction of sacral region: Secondary | ICD-10-CM | POA: Diagnosis not present

## 2017-03-09 DIAGNOSIS — M9903 Segmental and somatic dysfunction of lumbar region: Secondary | ICD-10-CM | POA: Diagnosis not present

## 2017-03-09 DIAGNOSIS — M5136 Other intervertebral disc degeneration, lumbar region: Secondary | ICD-10-CM | POA: Diagnosis not present

## 2017-03-09 DIAGNOSIS — M9905 Segmental and somatic dysfunction of pelvic region: Secondary | ICD-10-CM | POA: Diagnosis not present

## 2017-03-24 ENCOUNTER — Ambulatory Visit (INDEPENDENT_AMBULATORY_CARE_PROVIDER_SITE_OTHER): Payer: Medicare Other

## 2017-03-24 ENCOUNTER — Other Ambulatory Visit: Payer: Self-pay | Admitting: Sports Medicine

## 2017-03-24 ENCOUNTER — Ambulatory Visit (INDEPENDENT_AMBULATORY_CARE_PROVIDER_SITE_OTHER): Payer: Medicare Other | Admitting: Sports Medicine

## 2017-03-24 DIAGNOSIS — S93401A Sprain of unspecified ligament of right ankle, initial encounter: Secondary | ICD-10-CM

## 2017-03-24 DIAGNOSIS — M7751 Other enthesopathy of right foot: Secondary | ICD-10-CM

## 2017-03-24 DIAGNOSIS — M79671 Pain in right foot: Secondary | ICD-10-CM | POA: Diagnosis not present

## 2017-03-24 DIAGNOSIS — M25471 Effusion, right ankle: Secondary | ICD-10-CM

## 2017-03-24 NOTE — Patient Instructions (Addendum)
Limit your activity and use your rolling walker  Each day ice for 15 mins to help with pain and swelling  Wear your compression sleeve during the day to help with pain and swelling  Use Bengay to your ankle for pain  Drink 4 oz of tonic water at night to help reduce cramping  Return to office or call if pain is not better in 2 weeks

## 2017-03-25 ENCOUNTER — Encounter: Payer: Self-pay | Admitting: Sports Medicine

## 2017-03-25 NOTE — Progress Notes (Signed)
Subjective:  [name] is a 84 y.o. female patient who presents to office for evaluation of right ankle pain times 1 day states the pain is 5 out of 10 sharp in nature pain is worse with touch to the outside of her ankle does not recall any injury however does admit to increasing her activity with doing more exercises states that the pain is worse with extensive walking or standing states that she has tried Tylenol which helps to ease off the pain however still bothersome with direct pressure to the side of her ankle and with long periods of standing and walking even though she is using her rolling walker.  Patient also admits secondarily to some cramping that she experienced on last night.  Patient denies nausea vomiting fever chills or any other constitutional symptoms at this time.  Patient lives at [facility] on the second floor  Patient Active Problem List   Diagnosis Date Noted  . Atherosclerosis of abdominal aorta ([place]) 10/02/2015  . Obesity (BMI 30-39.9) 10/31/2014  . Chronic kidney disease (CKD), stage III (moderate) ([place]) 10/31/2014  . Ventral incisional hernia 10/31/2014  . Primary peritoneal carcinomatosis ([place]) 10/31/2014  . Incisional hernia, periumbilical, without obstruction or gangrene 11/03/2012  . Anemia associated with acute blood loss 04/03/2011  . Ovarian cancer ([place]) 01/25/2011  . HYPERLIPIDEMIA 04/23/2009  . Unspecified essential hypertension 04/23/2009  . GERD 04/23/2009  . OSTEOARTHRITIS 04/23/2009  . OSTEOPENIA 04/23/2009    Current Outpatient Medications on File Prior to Visit  Medication Sig Dispense Refill  . azelastine (ASTELIN) 0.1 % nasal spray     . B Complex Vitamins (VITAMIN B COMPLEX PO)     . Ca Phosphate-Cholecalciferol (EQL VITAMIN D-3 HIGH POTENCY) [phone] MG-UNIT TABS 1 capsule    . Calcium Citrate-Vitamin D (CITRACAL + D PO) Take 600 mg by mouth 2 (two) times daily.     . Cholecalciferol (VITAMIN D) 2000 UNITS tablet Take 2,000 Units  by mouth daily.     Marland Kitchen lisinopril (PRINIVIL,ZESTRIL) 10 MG tablet TAKE 1 TABLET BY MOUTH ONCE DAILY    . Multiple Vitamins-Minerals (CENTRUM SILVER 50+MEN PO) 1 tab    . nadolol (CORGARD) 20 MG tablet 1/2 tablet    . nystatin (MYCOSTATIN/NYSTOP) powder Apply topically 4 (four) times daily. 15 g 0  . nystatin cream (MYCOSTATIN) Apply 1 application topically 3 (three) times daily as needed. Applies to Outside of vagina and anus for Itching 30 g 1  . nystatin-triamcinolone ointment (MYCOLOG) Apply topically 2 (two) times daily as needed. 30 g 1  . omega-3 acid ethyl esters (LOVAZA) 1 g capsule 1 tablet    . omeprazole (PRILOSEC) 20 MG capsule Take 20 mg by mouth Daily.    Vladimir Faster Glycol-Propyl Glycol (SYSTANE) 0.4-0.3 % SOLN 1 drop into affected eye as needed    . Ranibizumab (LUCENTIS IO) Place into the left eye once as needed. Reported on 01/31/2015    . Red Yeast Rice 600 MG CAPS 1 tablet    . traMADol (ULTRAM) 50 MG tablet Take 50 mg by mouth every 6 (six) hours as needed.    . vitamin E 400 UNIT capsule 1 capsule    . zolpidem (AMBIEN) 10 MG tablet 1 tablet    . [DISCONTINUED] enoxaparin (LOVENOX) 40 MG/0.4ML SOLN Inject 0.4 mLs (40 mg total) into the skin daily. 11.2 mL 0   No current facility-administered medications on file prior to visit.     Allergies  Allergen Reactions  . Morphine  And Related     Given after knee replacement and code blue occurred  . Penicillins     REACTION: hives  . Shellfish Allergy     Pt has shellfish allergy only.  Has had IV contrast x 2 and did fine.    Objective:  General: Alert and oriented x3 in no acute distress  Dermatology: No open lesions bilateral lower extremities, no webspace macerations, no ecchymosis bilateral, all nails currently asymptomatic.  Vascular: Dorsalis Pedis and Posterior Tibial pedal pulses palpable faint 1 out of 4, Capillary Fill Time less than 5 seconds, scant pedal hair growth bilateral, focal edema to the right  lateral ankle, varicosities noted with venous skin changes bilateral, temperature gradient mildly decreased bilateral however consistent with patient age.  Neurology: Johney Maine sensation intact via light touch bilateral.  Musculoskeletal: There is tenderness with palpation at right ankle along the peroneal tendon course. No pain with calf compression bilateral. Range of motion within normal limits with mild guarding on right ankle.  Strength within normal limits in all groups 5 out of 5 on the left 4 out of 5 on right secondary to guarding due to pain.  Gait: Antalgic gait  Xrays  Right foot/ankle   Impression: Osseous mineralization within normal limits for patient age, there is diffuse significant arthritis throughout the foot with midtarsal breech there is no fracture located especially at the area of maximal tenderness along the posterior aspect of the fibula, soft tissue swelling mildly increased over the lateral ankle.  No other acute findings noted.  Assessment and Plan: Problem List Items Addressed This Visit    None    Visit Diagnoses    Tendonitis of ankle, right    -  Primary   Swelling of ankle, right       Foot pain, right       Sprain of right ankle, unspecified ligament, initial encounter         -Complete examination performed -Xrays reviewed -Discussed treatement options for possible tendinitis versus acute ankle sprain -Patient refused oral medicines because of history of GERD however advised patient that she may safely take Tylenol as needed -Dispensed Surgitube compression sleeve and advised rest ice elevation and over-the-counter topical pain rub -Recommend patient to purchase tonic water to drink before bedtime for cramping -Advised patient to limit her activity and to use her seated rolling walker to allow her to rest -Advised patient to refrain from aerobic exercise for now until pain is resolved -Advised patient if pain is not better within 2 weeks to return to  office for further evaluation -Patient to return to office as needed or sooner if condition worsens.  Landis Martins, DPM

## 2017-03-28 ENCOUNTER — Telehealth: Payer: Self-pay | Admitting: Podiatry

## 2017-03-28 DIAGNOSIS — M25571 Pain in right ankle and joints of right foot: Secondary | ICD-10-CM | POA: Diagnosis not present

## 2017-03-28 NOTE — Telephone Encounter (Signed)
This patient called to the office on Saturday at 11:30 saying she was having pain in her foot.  I reviewed her previous note and saw she was seen by Dr. Cannon Kettle.  I returned her call and promptly went to her voice mail and  then I left a message .   This patient called back at 1:30 saying she was still in severe pain and was unable to walk.  She says she could only sit in her chair in her room due to the pain.  She says she was told to ice her foot and take Tylenol.  She says the pain is worsening.  She says she was not offered an antiinflammatory medicine due to GERD.  I suggested a Medrol doespak for this weekend and for her to call Monday morning for an emergency appointment.  She declined my option.  She did not want to take medicine by mouth or walk in a cam walker.  She only wanted to be seen ASAP and receive a cortisone shot.  She then questioned who I was since I was not on the pamphlet she possessed.  She felt her best option was to call for an appointment on Monday where she could receive a cortisone injection. She was hoping to see Dr. Milinda Pointer.  We left it on her calling Monday AM for an appointment.   Gardiner Barefoot DPM

## 2017-03-31 ENCOUNTER — Ambulatory Visit (INDEPENDENT_AMBULATORY_CARE_PROVIDER_SITE_OTHER): Payer: Medicare Other

## 2017-03-31 ENCOUNTER — Ambulatory Visit (INDEPENDENT_AMBULATORY_CARE_PROVIDER_SITE_OTHER): Payer: Medicare Other | Admitting: Podiatry

## 2017-03-31 ENCOUNTER — Encounter: Payer: Self-pay | Admitting: Podiatry

## 2017-03-31 DIAGNOSIS — M7751 Other enthesopathy of right foot: Secondary | ICD-10-CM

## 2017-03-31 DIAGNOSIS — S82831A Other fracture of upper and lower end of right fibula, initial encounter for closed fracture: Secondary | ICD-10-CM

## 2017-03-31 DIAGNOSIS — M25471 Effusion, right ankle: Secondary | ICD-10-CM

## 2017-04-01 NOTE — Progress Notes (Signed)
She presents today with a severely swollen ankle right.  States that it radiates up the leg with swelling try I sent a prescription of prednisone provided by her other doctor also was provided tramadol.  She states that nothing seems to work it hurts terribly she denies any trauma to it.  She states that she saw Dr. Cannon Kettle a week or so ago and there was nothing done to help her leg.  Objective: Vital signs are stable she is alert and oriented x3 she has no calf pain does have strong palpable pulses to the right lower extremity swelling along the lateral where the peroneal portion of the leg and foot.  I reviewed previous radiographs which did not demonstrate any type of osseous abnormalities along the fibula though she does have exquisite pain in the distal aspect of the fibula just superior to the lateral malleolus.  I redid the radiographs today there is a transverse fracture appears to be a stress fracture along the fibula.  It appears to be healing already with a small area of sclerosis.  Assessment: Fracture fibula.  Plan: Follow-up with her in 3 weeks to make sure this is healing adequately and not displaced.  Placed her in a Cam walker today and she will utilize that with her walker.  She will utilize her pain medication with Tylenol if necessary.  She may discontinue the use of the prednisone.  We discussed tapering but she is only taking 1 tablet anyway.

## 2017-04-06 ENCOUNTER — Encounter: Payer: Self-pay | Admitting: *Deleted

## 2017-04-06 ENCOUNTER — Telehealth: Payer: Self-pay | Admitting: *Deleted

## 2017-04-06 DIAGNOSIS — H1851 Endothelial corneal dystrophy: Secondary | ICD-10-CM | POA: Diagnosis not present

## 2017-04-06 DIAGNOSIS — H348322 Tributary (branch) retinal vein occlusion, left eye, stable: Secondary | ICD-10-CM | POA: Diagnosis not present

## 2017-04-06 DIAGNOSIS — H25811 Combined forms of age-related cataract, right eye: Secondary | ICD-10-CM | POA: Diagnosis not present

## 2017-04-06 DIAGNOSIS — H2101 Hyphema, right eye: Secondary | ICD-10-CM | POA: Diagnosis not present

## 2017-04-06 DIAGNOSIS — H40013 Open angle with borderline findings, low risk, bilateral: Secondary | ICD-10-CM | POA: Diagnosis not present

## 2017-04-06 DIAGNOSIS — Z961 Presence of intraocular lens: Secondary | ICD-10-CM | POA: Diagnosis not present

## 2017-04-06 DIAGNOSIS — H01025 Squamous blepharitis left lower eyelid: Secondary | ICD-10-CM | POA: Diagnosis not present

## 2017-04-06 DIAGNOSIS — H01021 Squamous blepharitis right upper eyelid: Secondary | ICD-10-CM | POA: Diagnosis not present

## 2017-04-06 DIAGNOSIS — H16223 Keratoconjunctivitis sicca, not specified as Sjogren's, bilateral: Secondary | ICD-10-CM | POA: Diagnosis not present

## 2017-04-06 DIAGNOSIS — H01022 Squamous blepharitis right lower eyelid: Secondary | ICD-10-CM | POA: Diagnosis not present

## 2017-04-06 DIAGNOSIS — H01024 Squamous blepharitis left upper eyelid: Secondary | ICD-10-CM | POA: Diagnosis not present

## 2017-04-06 DIAGNOSIS — H00025 Hordeolum internum left lower eyelid: Secondary | ICD-10-CM | POA: Diagnosis not present

## 2017-04-06 NOTE — Telephone Encounter (Signed)
Pt states she has questions concerning her fracture care and was calling to talk to [place].

## 2017-04-06 NOTE — Telephone Encounter (Signed)
Pt states she is having a tough time wearing the heavy boot, and it takes about 10 minutes to put on so today when when she went to her eye doctor, she just wore her SAS shoe laced up very tight. I told pt the boot was to decrease movement of the foot, ankle and leg, and decrease the use of accessory muscles that pull on the structure of the foot, ankle and leg keeping the fracture irritated or pulled apart, which would delay healing. I told pt to decrease her activity to just what is necessary and stay in the boot. Pt states she is looking into a motorized chair to get to meals and activities at her [facility], and would also like a note for the walker she has. I told pt I would write a note for each and mail to her.

## 2017-04-06 NOTE — Telephone Encounter (Signed)
Mailed notes for walker and motorized wheelchair to pt.

## 2017-04-09 ENCOUNTER — Telehealth: Payer: Self-pay | Admitting: Podiatry

## 2017-04-09 MED ORDER — TRAMADOL HCL 50 MG PO TABS: 50.0000 mg | ORAL_TABLET | Freq: Three times a day (TID) | ORAL | 0 refills | Status: DC | PRN

## 2017-04-09 NOTE — Telephone Encounter (Signed)
I informed pt Dr. Milinda Pointer had okayed a prescription of the Tramadol, and I would send to the [facility] on [address].

## 2017-04-09 NOTE — Telephone Encounter (Signed)
Orders faxed to [facility].

## 2017-04-09 NOTE — Telephone Encounter (Signed)
ok 

## 2017-04-09 NOTE — Telephone Encounter (Signed)
I need a refill of the Tramadol pain medication. I'm in a lot of pain from this fracture. [facility] is [facility] [address]. Dr. Cannon Kettle wrote it for me although I'm a pt of Dr. Stephenie Acres.

## 2017-04-09 NOTE — Addendum Note (Signed)
Addended by: Harriett Sine D on: 04/09/2017 02:28 PM   Modules accepted: Orders

## 2017-04-13 ENCOUNTER — Telehealth: Payer: Self-pay | Admitting: Podiatry

## 2017-04-13 NOTE — Telephone Encounter (Signed)
Dr. Milinda Pointer is treating me for a stress fracture in my right foot. I have a follow up appointment with him on 14 March. I wanted to know if I can drive a car by then or would I need somebody to bring me? I'd like to know what type of physical exercise I can do? I'm sitting in a chair with my foot propped up most of the time and no one has instructed me do I need physical therapy, do I need to take some exercises, what do I need to do? We have a swimming pool here and I was wondering could I get into the pool and move around a little bit? I really feel like you all should be giving me some instructions of what I should be doing if I'm recovering me from a stress fracture. Please call me back at [phone] and let me hear from you. Thank you.

## 2017-04-13 NOTE — Telephone Encounter (Signed)
I informed pt that if she was driving a short distance to the appt, she could wear a stiff bottom shoe and bring the boot for any other walking. I told pt that she needed just to perform her acts of daily living, not to exercise even swimming, because she might fall. Pt states she does walk to meals, I told her that should be fine in the boot.

## 2017-04-23 ENCOUNTER — Ambulatory Visit (INDEPENDENT_AMBULATORY_CARE_PROVIDER_SITE_OTHER): Payer: Medicare Other

## 2017-04-23 ENCOUNTER — Encounter: Payer: Self-pay | Admitting: Podiatry

## 2017-04-23 ENCOUNTER — Ambulatory Visit (INDEPENDENT_AMBULATORY_CARE_PROVIDER_SITE_OTHER): Payer: Medicare Other | Admitting: Podiatry

## 2017-04-23 DIAGNOSIS — S82831D Other fracture of upper and lower end of right fibula, subsequent encounter for closed fracture with routine healing: Secondary | ICD-10-CM

## 2017-04-23 DIAGNOSIS — S82831A Other fracture of upper and lower end of right fibula, initial encounter for closed fracture: Secondary | ICD-10-CM | POA: Diagnosis not present

## 2017-04-23 DIAGNOSIS — M79676 Pain in unspecified toe(s): Secondary | ICD-10-CM | POA: Diagnosis not present

## 2017-04-23 DIAGNOSIS — B351 Tinea unguium: Secondary | ICD-10-CM | POA: Diagnosis not present

## 2017-04-25 NOTE — Progress Notes (Signed)
She presents today for follow-up of her fracture of her fibula right.  States that is feeling a lot better but are always wearing a boot.  She states that I know is not 100%.  States that she would also like to have her nails trimmed today.  She states that she can no longer reach her feet her nails are long and painful.  Objective: Vital signs are stable she is alert and oriented x3 pulses are palpable.  She still has tenderness on palpation to the distal third of the fibula right there is no ecchymosis no erythema cellulitis drainage or odor.  Radiographs do demonstrate a slowly healing bone callus and fibula which remains minimally displaced with a transverse fracture of the distal third.  Toenails are long thick yellow dystrophic-like mycotic sharply incurvated and painful on palpation as well as debridement.  Assessment: Pain in limb secondary to onychomycosis.  Healing fibula fracture right.  Plan: Expressed to her the importance of continuation of the use of the Cam walker which she does not present with today.  I expressed to her that I will follow-up with her in 6 weeks for another set of x-rays.  Debrided toenails 1 through 5 bilateral cover service secondary to pain and infection of the nail plate.

## 2017-05-06 ENCOUNTER — Telehealth: Payer: Self-pay | Admitting: Podiatry

## 2017-05-06 NOTE — Telephone Encounter (Signed)
Patients strap on her boot broke and wants to know if she can exchange it

## 2017-05-07 DIAGNOSIS — H4720 Unspecified optic atrophy: Secondary | ICD-10-CM | POA: Diagnosis not present

## 2017-05-07 DIAGNOSIS — L570 Actinic keratosis: Secondary | ICD-10-CM | POA: Diagnosis not present

## 2017-05-07 DIAGNOSIS — H3562 Retinal hemorrhage, left eye: Secondary | ICD-10-CM | POA: Diagnosis not present

## 2017-05-07 DIAGNOSIS — Z23 Encounter for immunization: Secondary | ICD-10-CM | POA: Diagnosis not present

## 2017-05-07 DIAGNOSIS — L821 Other seborrheic keratosis: Secondary | ICD-10-CM | POA: Diagnosis not present

## 2017-05-07 DIAGNOSIS — H209 Unspecified iridocyclitis: Secondary | ICD-10-CM | POA: Diagnosis not present

## 2017-05-07 DIAGNOSIS — H34832 Tributary (branch) retinal vein occlusion, left eye, with macular edema: Secondary | ICD-10-CM | POA: Diagnosis not present

## 2017-05-27 ENCOUNTER — Telehealth: Payer: Self-pay | Admitting: *Deleted

## 2017-05-27 NOTE — Telephone Encounter (Signed)
Left message informing pt of Dr. Stephenie Acres recommendations and to call with concerns.

## 2017-05-27 NOTE — Telephone Encounter (Signed)
I prefer that she remain in the boot. She was slow healing that fibular fx.  I suppose she could come in and replace boot with brace but still taking a risk of re fx.

## 2017-05-27 NOTE — Telephone Encounter (Signed)
Pt states the 5lb boot is causing pain in the right knee and hips, and she has had both knees replaced. Pt states she is to return to see Dr. Milinda Pointer end of April and would like to know if she could return to a regular shoe since it is so close to the appt time.

## 2017-06-09 ENCOUNTER — Ambulatory Visit (INDEPENDENT_AMBULATORY_CARE_PROVIDER_SITE_OTHER): Payer: Medicare Other

## 2017-06-09 ENCOUNTER — Ambulatory Visit (INDEPENDENT_AMBULATORY_CARE_PROVIDER_SITE_OTHER): Payer: Medicare Other | Admitting: Podiatry

## 2017-06-09 ENCOUNTER — Encounter: Payer: Self-pay | Admitting: Podiatry

## 2017-06-09 DIAGNOSIS — S82831D Other fracture of upper and lower end of right fibula, subsequent encounter for closed fracture with routine healing: Secondary | ICD-10-CM | POA: Diagnosis not present

## 2017-06-09 NOTE — Progress Notes (Signed)
She presents today for follow-up of her fractured right fibula distal one third.  States that he feels much better.  She states that she has not been wearing her cam walker.  Objective: Vital signs are stable she is alert and oriented x3.  There is no erythema edema cellulitis drainage or odor there is a palpable nodule overlying the healing fracture site.  Radiographs taken today demonstrate a nondisplaced fibular fracture approximately 50% healed with bone callus.  Assessment: Slowly healing fibular fracture distal one third.  Plan: I will allow her to remain out of the cam walker but she is to make sure that she is very safe and that she does not do anything this 1 over exert this leg.  She understands and is amenable to it should she have any trauma she will notify me immediately otherwise we will follow-up with her in 2 months at which time this should be resolved.

## 2017-06-19 DIAGNOSIS — M810 Age-related osteoporosis without current pathological fracture: Secondary | ICD-10-CM | POA: Diagnosis not present

## 2017-08-11 ENCOUNTER — Ambulatory Visit (INDEPENDENT_AMBULATORY_CARE_PROVIDER_SITE_OTHER): Payer: Medicare Other | Admitting: Podiatry

## 2017-08-11 ENCOUNTER — Encounter: Payer: Self-pay | Admitting: Podiatry

## 2017-08-11 DIAGNOSIS — S82831D Other fracture of upper and lower end of right fibula, subsequent encounter for closed fracture with routine healing: Secondary | ICD-10-CM | POA: Diagnosis not present

## 2017-08-11 DIAGNOSIS — M779 Enthesopathy, unspecified: Secondary | ICD-10-CM

## 2017-08-11 DIAGNOSIS — M778 Other enthesopathies, not elsewhere classified: Secondary | ICD-10-CM

## 2017-08-11 NOTE — Progress Notes (Signed)
She presents today for follow-up of her fractured fibula right.  States that she thinks is doing much better she is pretty much back to her normal routine walking with her cane rather than a walker and stating that she really does not have any pain other than the pain that she gets in the big toe joint on occasion.  She states that she has noticed that recently more painful than the ankle itself.  Objective: Vital signs are stable alert and oriented x3.  Pulses are palpable.  She has no pain on palpation of the fibular fracture I can feel a small nodule that is present more than likely the scar on the bone.  She does have tenderness on palpation of the first metatarsophalangeal joint of the right foot.  Assessment: Pain in limb secondary to capsulitis of the first metatarsophalangeal joint right.  Resolving fracture right fibula.  Plan: Discussed etiology pathology conservative versus surgical therapies at this point after sterile Betadine skin prep I injected 5 mg of Kenalog and 2 mg of Marcaine into the first metatarsophalangeal joint after sterile Betadine skin prep.  She will notify us with questions or concerns she will continue the use of her cane.  Follow-up with me should she have any ankle pain or worsening of her big toe pain.

## 2017-08-19 DIAGNOSIS — L304 Erythema intertrigo: Secondary | ICD-10-CM | POA: Diagnosis not present

## 2017-08-19 DIAGNOSIS — L821 Other seborrheic keratosis: Secondary | ICD-10-CM | POA: Diagnosis not present

## 2017-08-19 DIAGNOSIS — L570 Actinic keratosis: Secondary | ICD-10-CM | POA: Diagnosis not present

## 2017-08-27 DIAGNOSIS — H3562 Retinal hemorrhage, left eye: Secondary | ICD-10-CM | POA: Diagnosis not present

## 2017-08-27 DIAGNOSIS — H26492 Other secondary cataract, left eye: Secondary | ICD-10-CM | POA: Diagnosis not present

## 2017-08-27 DIAGNOSIS — H34832 Tributary (branch) retinal vein occlusion, left eye, with macular edema: Secondary | ICD-10-CM | POA: Diagnosis not present

## 2017-08-27 DIAGNOSIS — H25011 Cortical age-related cataract, right eye: Secondary | ICD-10-CM | POA: Diagnosis not present

## 2017-09-09 ENCOUNTER — Emergency Department (HOSPITAL_COMMUNITY): Payer: Medicare Other

## 2017-09-09 ENCOUNTER — Emergency Department (HOSPITAL_COMMUNITY)
Admission: EM | Admit: 2017-09-09 | Discharge: 2017-09-09 | Disposition: A | Discharge status: Home / Self Care | Payer: Medicare Other | Attending: Emergency Medicine | Admitting: Emergency Medicine

## 2017-09-09 ENCOUNTER — Encounter (HOSPITAL_COMMUNITY): Payer: Self-pay | Admitting: Obstetrics and Gynecology

## 2017-09-09 DIAGNOSIS — Z8543 Personal history of malignant neoplasm of ovary: Secondary | ICD-10-CM | POA: Diagnosis not present

## 2017-09-09 DIAGNOSIS — N183 Chronic kidney disease, stage 3 (moderate): Secondary | ICD-10-CM | POA: Insufficient documentation

## 2017-09-09 DIAGNOSIS — Z79899 Other long term (current) drug therapy: Secondary | ICD-10-CM | POA: Insufficient documentation

## 2017-09-09 DIAGNOSIS — Z87891 Personal history of nicotine dependence: Secondary | ICD-10-CM | POA: Insufficient documentation

## 2017-09-09 DIAGNOSIS — Z96653 Presence of artificial knee joint, bilateral: Secondary | ICD-10-CM | POA: Insufficient documentation

## 2017-09-09 DIAGNOSIS — I129 Hypertensive chronic kidney disease with stage 1 through stage 4 chronic kidney disease, or unspecified chronic kidney disease: Secondary | ICD-10-CM | POA: Diagnosis not present

## 2017-09-09 DIAGNOSIS — I160 Hypertensive urgency: Secondary | ICD-10-CM | POA: Diagnosis not present

## 2017-09-09 DIAGNOSIS — R42 Dizziness and giddiness: Secondary | ICD-10-CM | POA: Diagnosis not present

## 2017-09-09 DIAGNOSIS — I10 Essential (primary) hypertension: Secondary | ICD-10-CM | POA: Diagnosis not present

## 2017-09-09 DIAGNOSIS — N3000 Acute cystitis without hematuria: Secondary | ICD-10-CM | POA: Diagnosis not present

## 2017-09-09 HISTORY — DX: Disorder of kidney and ureter, unspecified: N28.9

## 2017-09-09 LAB — CBC
HCT: 40.1 % (ref 36.0–46.0)
Hemoglobin: 13.7 g/dL (ref 12.0–15.0)
MCH: 30 pg (ref 26.0–34.0)
MCHC: 34.2 g/dL (ref 30.0–36.0)
MCV: 87.9 fL (ref 78.0–100.0)
Platelets: 210 10*3/uL (ref 150–400)
RBC: 4.56 MIL/uL (ref 3.87–5.11)
RDW: 13.6 % (ref 11.5–15.5)
WBC: 6.9 10*3/uL (ref 4.0–10.5)

## 2017-09-09 LAB — COMPREHENSIVE METABOLIC PANEL
ALT: 17 U/L (ref 0–44)
AST: 25 U/L (ref 15–41)
Albumin: 4 g/dL (ref 3.5–5.0)
Alkaline Phosphatase: 43 U/L (ref 38–126)
Anion gap: 10 (ref 5–15)
BUN: 33 mg/dL — ABNORMAL HIGH (ref 8–23)
CO2: 21 mmol/L — ABNORMAL LOW (ref 22–32)
Calcium: 10 mg/dL (ref 8.9–10.3)
Chloride: 104 mmol/L (ref 98–111)
Creatinine, Ser: 1.26 mg/dL — ABNORMAL HIGH (ref 0.44–1.00)
GFR calc Af Amer: 43 mL/min — ABNORMAL LOW (ref >60)
GFR calc non Af Amer: 37 mL/min — ABNORMAL LOW (ref >60)
Glucose, Bld: 104 mg/dL — ABNORMAL HIGH (ref 70–99)
Potassium: 4.8 mmol/L (ref 3.5–5.1)
Sodium: 135 mmol/L (ref 135–145)
Total Bilirubin: 0.7 mg/dL (ref 0.3–1.2)
Total Protein: 7.6 g/dL (ref 6.5–8.1)

## 2017-09-09 LAB — URINALYSIS, ROUTINE W REFLEX MICROSCOPIC
Bilirubin Urine: NEGATIVE
Glucose, UA: NEGATIVE mg/dL
Hgb urine dipstick: NEGATIVE
Ketones, ur: NEGATIVE mg/dL
Nitrite: NEGATIVE
Protein, ur: NEGATIVE mg/dL
Specific Gravity, Urine: 1.005 (ref 1.005–1.030)
pH: 7 (ref 5.0–8.0)

## 2017-09-09 LAB — I-STAT TROPONIN, ED: Troponin i, poc: 0 ng/mL (ref 0.00–0.08)

## 2017-09-09 LAB — PROTIME-INR
INR: 0.99
Prothrombin Time: 13 seconds (ref 11.4–15.2)

## 2017-09-09 MED ORDER — NADOLOL 20 MG PO TABS: 10.0000 mg | ORAL_TABLET | Freq: Two times a day (BID) | ORAL | 0 refills | Status: DC

## 2017-09-09 MED ORDER — CIPROFLOXACIN HCL 500 MG PO TABS: 500.0000 mg | ORAL_TABLET | Freq: Two times a day (BID) | ORAL | 0 refills | Status: DC

## 2017-09-09 MED ORDER — LISINOPRIL 10 MG PO TABS: 10.0000 mg | ORAL_TABLET | Freq: Two times a day (BID) | ORAL | 0 refills | Status: DC

## 2017-09-09 MED ORDER — CIPROFLOXACIN HCL 500 MG PO TABS
500.0000 mg | ORAL_TABLET | Freq: Once | ORAL | Status: AC
  Administered 2017-09-09: 500 mg via ORAL
  Filled 2017-09-09: qty 1

## 2017-09-09 MED ORDER — LISINOPRIL 10 MG PO TABS
10.0000 mg | ORAL_TABLET | Freq: Once | ORAL | Status: AC
  Administered 2017-09-09: 10 mg via ORAL
  Filled 2017-09-09: qty 1

## 2017-09-09 MED ORDER — METOPROLOL TARTRATE 25 MG PO TABS
12.5000 mg | ORAL_TABLET | Freq: Once | ORAL | Status: AC
  Administered 2017-09-09: 12.5 mg via ORAL
  Filled 2017-09-09: qty 1

## 2017-09-09 NOTE — ED Provider Notes (Addendum)
[facility] DEPT Provider Note   CSN: [id number] Arrival date & time: 09/09/17  1432     History   Chief Complaint Chief Complaint  Patient presents with  . Hypertension    HPI [name] is a 84 y.o. female.  HPI Patient having lunch with her daughter at assisted living.  They had gotten her food and sat down at the table.  She suddenly started to feel very lightheaded.  Her daughter noticed that she did not appear well.  Patient denied any other associated symptoms.  She felt that she needs to go back to her room and rest.  Checked blood pressure and was elevated systolics greater than 409W.  Typically well controlled.  Rechecked and systolic up at 119.  Patient reports that symptoms did not progress but remained with a generalized lightheaded quality.  Does not describe vertigo or any imbalance, visual changes or focal motor deficits.  Patient is compliant with daily lisinopril 10 mg in the a.m. and daily nadolol 10 mg in the a.m.  She has otherwise been well recently.  She feels improved at this time although blood pressure remains elevated.  The only change that she can identify is that she had gotten a new coffee grinder and drank 4 cups of regular strength coffee this morning which she does not typically have that much. Past Medical History:  Diagnosis Date  . Anemia    hx of   . Anemia associated with acute blood loss 04/03/2011  . Arthritis    knees   . ARTHRITIS, SEPTIC 05/28/2009   Qualifier: Diagnosis of  By: Tommy Medal MD, Roderic Scarce    . Ascites   . Blood transfusion    hx of 2011   . Complication of anesthesia    Sodium drops per pt   . Cystitis   . DIARRHEA, ANTIBIOTIC ASSOCIATED 05/31/2009   Qualifier: Diagnosis of  By: Tommy Medal MD, Roderic Scarce    . GERD (gastroesophageal reflux disease)   . H/O hiatal hernia   . Hyperlipidemia   . Hypertension   . Hyponatremia   . Neutropenia with fever ([place]) 05/18/2011  . OSTEOMYELITIS, CHRONIC,  LOWER LEG 04/23/2009   Qualifier: Diagnosis of  By: Johnnye Sima MD, Dellis Filbert    . Osteopenia   . OSTEOPOROSIS 04/23/2009   Qualifier: Diagnosis of  By: Johnnye Sima MD, Dellis Filbert    . Ovarian cancer ([place]) 01/25/2011  . Pneumonia    hx of   . Postmenopausal atrophic vaginitis   . PROSTHETIC JOINT COMPLICATION 1/47/8295   Qualifier: Diagnosis of  By: Tommy Medal MD, Roderic Scarce    . Renal disorder    Decreased kidney function  . Staph infection 2010   after knee replacement  . Urinary frequency   . Vulvitis     Patient Active Problem List   Diagnosis Date Noted  . Atherosclerosis of abdominal aorta ([place]) 10/02/2015  . Obesity (BMI 30-39.9) 10/31/2014  . Chronic kidney disease (CKD), stage III (moderate) ([place]) 10/31/2014  . Ventral incisional hernia 10/31/2014  . Primary peritoneal carcinomatosis ([place]) 10/31/2014  . Incisional hernia, periumbilical, without obstruction or gangrene 11/03/2012  . Anemia associated with acute blood loss 04/03/2011  . Ovarian cancer ([place]) 01/25/2011  . HYPERLIPIDEMIA 04/23/2009  . Unspecified essential hypertension 04/23/2009  . GERD 04/23/2009  . OSTEOARTHRITIS 04/23/2009  . OSTEOPENIA 04/23/2009    Past Surgical History:  Procedure Laterality Date  . ABDOMINAL HYSTERECTOMY  04/01/2011   Procedure: HYSTERECTOMY ABDOMINAL;  Surgeon: Alvino Chapel,  MD;  Location: [facility];  Service: Gynecology;  Laterality: N/A;  . APPENDECTOMY    . JOINT REPLACEMENT     R knee in 2008, 5 operations on L knee  . LAPAROTOMY  04/01/2011   Procedure: EXPLORATORY LAPAROTOMY;  Surgeon: Alvino Chapel, MD;  Location: [facility];  Service: Gynecology;  Laterality: N/A;  . OTHER SURGICAL HISTORY     hx of C section 1966  . SALPINGOOPHORECTOMY  04/01/2011   Procedure: SALPINGO OOPHERECTOMY;  Surgeon: Alvino Chapel, MD;  Location: [facility];  Service: Gynecology;  Laterality: Bilateral;  . TONSILLECTOMY       OB History    Gravida      Para      Term       Preterm      AB      Living  3     SAB      TAB      Ectopic      Multiple      Live Births               Home Medications    Prior to Admission medications   Medication Sig Start Date End Date Taking? Authorizing Provider  azelastine (ASTELIN) 0.1 % nasal spray Place 1 spray into both nostrils 2 (two) times daily as needed.  12/06/15  Yes [provider]  B Complex Vitamins (VITAMIN B COMPLEX PO) Take 1 tablet by mouth daily.    Yes [provider]  Ca Phosphate-Cholecalciferol (EQL VITAMIN D-3 HIGH POTENCY) [phone] MG-UNIT TABS Take 1 capsule by mouth daily   Yes [provider]  Calcium Citrate-Vitamin D (CITRACAL + D PO) Take 600 mg by mouth 2 (two) times daily.    Yes [provider]  Cholecalciferol (VITAMIN D) 2000 UNITS tablet Take 2,000 Units by mouth daily.    Yes [provider]  denosumab (PROLIA) 60 MG/ML SOSY injection Inject 60 mg into the skin every 6 (six) months.   Yes [provider]  lisinopril (PRINIVIL,ZESTRIL) 10 MG tablet TAKE 1 TABLET BY MOUTH ONCE DAILY   Yes [provider]  Multiple Vitamins-Minerals (CENTRUM SILVER 50+MEN PO) Take 1 tablet by mouth daily   Yes [provider]  nadolol (CORGARD) 20 MG tablet Take 10 mg by mouth daily   Yes [provider]  nystatin cream (MYCOSTATIN) Apply 1 application topically 3 (three) times daily as needed. Applies to Outside of vagina and anus for Itching 01/30/16  Yes Nancy Marus, MD  omega-3 acid ethyl esters (LOVAZA) 1 g capsule 1 tablet   Yes [provider]  omeprazole (PRILOSEC) 20 MG capsule Take 20 mg by mouth 2 (two) times daily before a meal.  11/16/11  Yes [provider]  Polyethyl Glycol-Propyl Glycol (SYSTANE) 0.4-0.3 % SOLN 1 drop into affected eye as needed   Yes [provider]  Red Yeast Rice 600 MG CAPS 1 tablet by mouth twice daily 09/03/12  Yes [provider]  traMADol  (ULTRAM) 50 MG tablet Take 1 tablet (50 mg total) by mouth every 8 (eight) hours as needed. 04/09/17  Yes Hyatt, Max T, DPM  vitamin E 400 UNIT capsule 1 capsule   Yes [provider]  zolpidem (AMBIEN) 10 MG tablet take 5-10 mg by mouth as needed for sleep   Yes [provider]  ciprofloxacin (CIPRO) 500 MG tablet Take 1 tablet (500 mg total) by mouth 2 (two) times daily. One po bid x  7 days 09/09/17   Charlesetta Shanks, MD  lisinopril (PRINIVIL,ZESTRIL) 10 MG tablet Take 1 tablet (10 mg total) by mouth 2 (two) times daily. 09/09/17   Charlesetta Shanks, MD  nadolol (CORGARD) 20 MG tablet Take 0.5 tablets (10 mg total) by mouth 2 (two) times daily. 09/09/17   Charlesetta Shanks, MD  nystatin (MYCOSTATIN/NYSTOP) powder Apply topically 4 (four) times daily. Patient not taking: Reported on 09/09/2017 01/30/16   Nancy Marus, MD  nystatin-triamcinolone ointment [facility]) Apply topically 2 (two) times daily as needed. Patient not taking: Reported on 09/09/2017 06/08/14   Nancy Marus, MD  enoxaparin (LOVENOX) 40 MG/0.4ML SOLN Inject 0.4 mLs (40 mg total) into the skin daily. 04/04/11 04/18/11  Lahoma Crocker, MD    Family History Family History  Problem Relation Age of Onset  . Cancer Other        Bladder cancer  . Heart attack Brother   . Diabetes Brother     Social History Social History   Tobacco Use  . Smoking status: Former Smoker    Last attempt to quit: 02/11/1952    Years since quitting: 65.6  . Smokeless tobacco: Never Used  Substance Use Topics  . Alcohol use: Not Currently  . Drug use: Never     Allergies   Morphine and related; Penicillins; and Shellfish allergy   Review of Systems Review of Systems 10 Systems reviewed and are negative for acute change except as noted in the HPI.   Physical Exam Updated Vital Signs BP (!) 170/79   Pulse 86   Temp 98.4 F (36.9 C) (Oral)   Resp 16   SpO2 99%   Physical Exam  Constitutional: She is oriented to person,  place, and time. She appears well-developed and well-nourished.  HENT:  Head: Normocephalic and atraumatic.  Mouth/Throat: Oropharynx is clear and moist.  Eyes: Pupils are equal, round, and reactive to light. EOM are normal.  Neck: Neck supple.  Cardiovascular: Normal rate, regular rhythm, normal heart sounds and intact distal pulses.  Pulmonary/Chest: Effort normal and breath sounds normal.  Abdominal: Soft. Bowel sounds are normal. She exhibits no distension. There is no tenderness.  Musculoskeletal: Normal range of motion. She exhibits no edema.  Neurological: She is alert and oriented to person, place, and time. She has normal strength. No cranial nerve deficit. She exhibits normal muscle tone. Coordination normal. GCS eye subscore is 4. GCS verbal subscore is 5. GCS motor subscore is 6.  Skin: Skin is warm, dry and intact.  Psychiatric: She has a normal mood and affect.     ED Treatments / Results  Labs (all labs ordered are listed, but only abnormal results are displayed) Labs Reviewed  COMPREHENSIVE METABOLIC PANEL - Abnormal; Notable for the following components:      Result Value   CO2 21 (*)    Glucose, Bld 104 (*)    BUN 33 (*)    Creatinine, Ser 1.26 (*)    GFR calc non Af Amer 37 (*)    GFR calc Af Amer 43 (*)    All other components within normal limits  URINALYSIS, ROUTINE W REFLEX MICROSCOPIC - Abnormal; Notable for the following components:   Leukocytes, UA MODERATE (*)    Bacteria, UA RARE (*)    All other components within normal limits  URINE CULTURE  CBC  PROTIME-INR  I-STAT TROPONIN, ED    EKG EKG Interpretation  Date/Time:  Wednesday September 09 2017 15:40:49 EDT Ventricular Rate:  81 PR Interval:  QRS Duration: 103 QT Interval:  380 QTC Calculation: 442 R Axis:   19 Text Interpretation:  Sinus rhythm Consider left atrial enlargement Low voltage, precordial leads Baseline wander in lead(s) II III aVL aVF V1 V5 no change from previous Confirmed by  Charlesetta Shanks [phone]) on 09/09/2017 6:53:29 PM   Radiology Ct Head Wo Contrast  Result Date: 09/09/2017 CLINICAL DATA:  Lightheaded with hypertension. Neuro deficits, subacute. EXAM: CT HEAD WITHOUT CONTRAST TECHNIQUE: Contiguous axial images were obtained from the base of the skull through the vertex without intravenous contrast. COMPARISON:  CT head without contrast 05/20/2009. FINDINGS: Brain: There is some progression in diffuse periventricular and subcortical white matter hypoattenuation bilaterally. No acute cortical infarct, hemorrhage, or mass lesion is present. The ventricles are proportionate to the degree of atrophy. A 16 mm calcified meningioma adjacent to the superior sagittal sinus is stable no new lesions are present. There is no significant extra-axial fluid collection. The brainstem and cerebellum are normal. Vascular: Atherosclerotic calcifications are present within the cavernous internal carotid arteries bilaterally. There is no hyperdense vessel. Skull: Calvarium is intact. No focal lytic or blastic lesions are present. Sinuses/Orbits: The left sphenoid sinus is opacified. Hyperdense material suggests inspissated secretions. The right sphenoid sinus is clear. There is no osseous erosion. The remaining paranasal sinuses in the mastoid air cells are clear. A left lens replacement is noted. Globes and orbits are otherwise within normal limits. IMPRESSION: 1. No acute intracranial abnormality. 2. Left sphenoid sinus disease. 3. Progressive white matter disease likely reflects the sequela of chronic microvascular ischemia without a focal cortical defect. 4. Stable calcified meningioma.  No follow-up necessary. Electronically Signed   By: San Morelle M.D.   On: 09/09/2017 17:30    Procedures Procedures (including critical care time)  Medications Ordered in ED Medications  ciprofloxacin (CIPRO) tablet 500 mg (has no administration in time range)  metoprolol tartrate (LOPRESSOR)  tablet 12.5 mg (12.5 mg Oral Given 09/09/17 1607)  lisinopril (PRINIVIL,ZESTRIL) tablet 10 mg (10 mg Oral Given 09/09/17 1607)     Initial Impression / Assessment and Plan / ED Course  I have reviewed the triage vital signs and the nursing notes.  Pertinent labs & imaging results that were available during my care of the patient were reviewed by me and considered in my medical decision making (see chart for details).      Final Clinical Impressions(s) / ED Diagnoses   Final diagnoses:  Hypertensive urgency  Acute cystitis without hematuria   Patient presents with lightheadedness and hypertension.  No associated neurologic symptoms or headache.  Agnostic work-up within normal limits.  Patient is now asymptomatic.  She has been given an additional dose of evening beta-blocker and lisinopril.  At this time we will have her increase her lisinopril from 10 mg once in the morning to 10 mg twice daily as well as increase her nadolol from 10 mg a.m. to 10 mg twice daily.  She also incidentally has signs of UTI with positive leuk esterase and white cells in the urine.  With symptoms of lightheadedness this may represent urinary tract infection with early symptoms.  Patient reports that she has taken Cipro in the past but does not tolerate any type of penicillin or anything related to penicillin.  Will initiate Cipro.  She is advised culture has been obtained and she is to follow-up with her PCP.  Return precautions reviewed. ED Discharge Orders        Ordered    lisinopril (PRINIVIL,ZESTRIL) 10  MG tablet  2 times daily     09/09/17 1928    nadolol (CORGARD) 20 MG tablet  2 times daily,   Status:  Discontinued     09/09/17 1928    nadolol (CORGARD) 20 MG tablet  2 times daily     09/09/17 1940    ciprofloxacin (CIPRO) 500 MG tablet  2 times daily     09/09/17 2010       Charlesetta Shanks, MD 09/09/17 Raye Sorrow    Charlesetta Shanks, MD 09/09/17 2012

## 2017-09-09 NOTE — ED Triage Notes (Signed)
Pt BIBA. Pt was feeling lightheaded at lunch, headed home and took BP 185/100. Pt states lightheadedness has since past. Denies any other symptoms.

## 2017-09-09 NOTE — Discharge Instructions (Addendum)
1.  Increase your lisinopril to 10 mg in the morning and 10 mg in the evening.  Increase your nadolol to 10 mg in the morning and 10 mg in the evening. 2.  Take your blood pressure 3 times a day.  Keep a log of these blood pressures to show your family doctor for further adjustments to your blood pressure medications.  If you measure your blood pressure and it is less than 177 systolic ( upper number), do not take the prescribed dose of nadolol at that time.

## 2017-09-11 LAB — URINE CULTURE

## 2017-09-15 DIAGNOSIS — I10 Essential (primary) hypertension: Secondary | ICD-10-CM | POA: Diagnosis not present

## 2017-09-16 DIAGNOSIS — R8290 Unspecified abnormal findings in urine: Secondary | ICD-10-CM | POA: Diagnosis not present

## 2017-09-16 DIAGNOSIS — H6120 Impacted cerumen, unspecified ear: Secondary | ICD-10-CM | POA: Diagnosis not present

## 2017-09-16 DIAGNOSIS — I10 Essential (primary) hypertension: Secondary | ICD-10-CM | POA: Diagnosis not present

## 2017-09-18 ENCOUNTER — Other Ambulatory Visit: Payer: Self-pay | Admitting: Family Medicine

## 2017-09-18 DIAGNOSIS — I10 Essential (primary) hypertension: Secondary | ICD-10-CM

## 2017-10-05 ENCOUNTER — Ambulatory Visit
Admission: RE | Admit: 2017-10-05 | Discharge: 2017-10-05 | Disposition: A | Discharge status: Home / Self Care | Payer: Medicare Other | Source: Ambulatory Visit | Attending: Family Medicine | Admitting: Family Medicine

## 2017-10-05 DIAGNOSIS — I10 Essential (primary) hypertension: Secondary | ICD-10-CM

## 2017-10-05 DIAGNOSIS — I150 Renovascular hypertension: Secondary | ICD-10-CM | POA: Diagnosis not present

## 2017-10-07 DIAGNOSIS — L821 Other seborrheic keratosis: Secondary | ICD-10-CM | POA: Diagnosis not present

## 2017-10-07 DIAGNOSIS — L570 Actinic keratosis: Secondary | ICD-10-CM | POA: Diagnosis not present

## 2017-10-09 DIAGNOSIS — M48061 Spinal stenosis, lumbar region without neurogenic claudication: Secondary | ICD-10-CM | POA: Diagnosis not present

## 2017-10-09 DIAGNOSIS — N183 Chronic kidney disease, stage 3 (moderate): Secondary | ICD-10-CM | POA: Diagnosis not present

## 2017-10-09 DIAGNOSIS — I77811 Abdominal aortic ectasia: Secondary | ICD-10-CM | POA: Diagnosis not present

## 2017-10-09 DIAGNOSIS — I10 Essential (primary) hypertension: Secondary | ICD-10-CM | POA: Diagnosis not present

## 2017-10-09 DIAGNOSIS — I700 Atherosclerosis of aorta: Secondary | ICD-10-CM | POA: Diagnosis not present

## 2017-10-09 DIAGNOSIS — Z6833 Body mass index (BMI) 33.0-33.9, adult: Secondary | ICD-10-CM | POA: Diagnosis not present

## 2017-10-09 DIAGNOSIS — E7849 Other hyperlipidemia: Secondary | ICD-10-CM | POA: Diagnosis not present

## 2017-10-09 DIAGNOSIS — C569 Malignant neoplasm of unspecified ovary: Secondary | ICD-10-CM | POA: Diagnosis not present

## 2017-10-09 DIAGNOSIS — Z1389 Encounter for screening for other disorder: Secondary | ICD-10-CM | POA: Diagnosis not present

## 2017-10-09 DIAGNOSIS — R9082 White matter disease, unspecified: Secondary | ICD-10-CM | POA: Diagnosis not present

## 2017-10-09 DIAGNOSIS — E871 Hypo-osmolality and hyponatremia: Secondary | ICD-10-CM | POA: Diagnosis not present

## 2017-10-09 DIAGNOSIS — R946 Abnormal results of thyroid function studies: Secondary | ICD-10-CM | POA: Diagnosis not present

## 2017-10-09 DIAGNOSIS — M859 Disorder of bone density and structure, unspecified: Secondary | ICD-10-CM | POA: Diagnosis not present

## 2017-10-09 DIAGNOSIS — D320 Benign neoplasm of cerebral meninges: Secondary | ICD-10-CM | POA: Diagnosis not present

## 2017-10-20 DIAGNOSIS — H2101 Hyphema, right eye: Secondary | ICD-10-CM | POA: Diagnosis not present

## 2017-10-20 DIAGNOSIS — H25811 Combined forms of age-related cataract, right eye: Secondary | ICD-10-CM | POA: Diagnosis not present

## 2017-10-20 DIAGNOSIS — H40013 Open angle with borderline findings, low risk, bilateral: Secondary | ICD-10-CM | POA: Diagnosis not present

## 2017-10-20 DIAGNOSIS — H00025 Hordeolum internum left lower eyelid: Secondary | ICD-10-CM | POA: Diagnosis not present

## 2017-10-20 DIAGNOSIS — Z961 Presence of intraocular lens: Secondary | ICD-10-CM | POA: Diagnosis not present

## 2017-10-20 DIAGNOSIS — H01024 Squamous blepharitis left upper eyelid: Secondary | ICD-10-CM | POA: Diagnosis not present

## 2017-10-20 DIAGNOSIS — H01022 Squamous blepharitis right lower eyelid: Secondary | ICD-10-CM | POA: Diagnosis not present

## 2017-10-20 DIAGNOSIS — H348322 Tributary (branch) retinal vein occlusion, left eye, stable: Secondary | ICD-10-CM | POA: Diagnosis not present

## 2017-10-20 DIAGNOSIS — H01021 Squamous blepharitis right upper eyelid: Secondary | ICD-10-CM | POA: Diagnosis not present

## 2017-10-20 DIAGNOSIS — H1851 Endothelial corneal dystrophy: Secondary | ICD-10-CM | POA: Diagnosis not present

## 2017-10-20 DIAGNOSIS — H01025 Squamous blepharitis left lower eyelid: Secondary | ICD-10-CM | POA: Diagnosis not present

## 2017-10-20 DIAGNOSIS — H16223 Keratoconjunctivitis sicca, not specified as Sjogren's, bilateral: Secondary | ICD-10-CM | POA: Diagnosis not present

## 2017-11-12 DIAGNOSIS — M810 Age-related osteoporosis without current pathological fracture: Secondary | ICD-10-CM | POA: Diagnosis not present

## 2017-11-12 DIAGNOSIS — G4700 Insomnia, unspecified: Secondary | ICD-10-CM | POA: Diagnosis not present

## 2017-11-12 DIAGNOSIS — D649 Anemia, unspecified: Secondary | ICD-10-CM | POA: Diagnosis not present

## 2017-11-12 DIAGNOSIS — I10 Essential (primary) hypertension: Secondary | ICD-10-CM | POA: Diagnosis not present

## 2017-11-12 DIAGNOSIS — E782 Mixed hyperlipidemia: Secondary | ICD-10-CM | POA: Diagnosis not present

## 2017-11-12 DIAGNOSIS — Z8543 Personal history of malignant neoplasm of ovary: Secondary | ICD-10-CM | POA: Diagnosis not present

## 2017-11-12 DIAGNOSIS — Z79899 Other long term (current) drug therapy: Secondary | ICD-10-CM | POA: Diagnosis not present

## 2017-11-12 DIAGNOSIS — Z0000 Encounter for general adult medical examination without abnormal findings: Secondary | ICD-10-CM | POA: Diagnosis not present

## 2017-11-12 DIAGNOSIS — N189 Chronic kidney disease, unspecified: Secondary | ICD-10-CM | POA: Diagnosis not present

## 2017-11-12 DIAGNOSIS — J309 Allergic rhinitis, unspecified: Secondary | ICD-10-CM | POA: Diagnosis not present

## 2017-11-12 DIAGNOSIS — Z23 Encounter for immunization: Secondary | ICD-10-CM | POA: Diagnosis not present

## 2017-11-12 DIAGNOSIS — R899 Unspecified abnormal finding in specimens from other organs, systems and tissues: Secondary | ICD-10-CM | POA: Diagnosis not present

## 2017-11-13 DIAGNOSIS — J309 Allergic rhinitis, unspecified: Secondary | ICD-10-CM | POA: Diagnosis not present

## 2017-11-13 DIAGNOSIS — N189 Chronic kidney disease, unspecified: Secondary | ICD-10-CM | POA: Diagnosis not present

## 2017-11-13 DIAGNOSIS — G4700 Insomnia, unspecified: Secondary | ICD-10-CM | POA: Diagnosis not present

## 2017-11-13 DIAGNOSIS — I10 Essential (primary) hypertension: Secondary | ICD-10-CM | POA: Diagnosis not present

## 2017-11-13 DIAGNOSIS — Z79899 Other long term (current) drug therapy: Secondary | ICD-10-CM | POA: Diagnosis not present

## 2017-11-13 DIAGNOSIS — Z8543 Personal history of malignant neoplasm of ovary: Secondary | ICD-10-CM | POA: Diagnosis not present

## 2017-11-13 DIAGNOSIS — E782 Mixed hyperlipidemia: Secondary | ICD-10-CM | POA: Diagnosis not present

## 2017-11-13 DIAGNOSIS — M810 Age-related osteoporosis without current pathological fracture: Secondary | ICD-10-CM | POA: Diagnosis not present

## 2017-11-17 DIAGNOSIS — Z1231 Encounter for screening mammogram for malignant neoplasm of breast: Secondary | ICD-10-CM | POA: Diagnosis not present

## 2017-11-17 DIAGNOSIS — C569 Malignant neoplasm of unspecified ovary: Secondary | ICD-10-CM | POA: Diagnosis not present

## 2017-11-23 DIAGNOSIS — N3942 Incontinence without sensory awareness: Secondary | ICD-10-CM | POA: Diagnosis not present

## 2017-11-23 DIAGNOSIS — N183 Chronic kidney disease, stage 3 (moderate): Secondary | ICD-10-CM | POA: Diagnosis not present

## 2017-12-14 DIAGNOSIS — J309 Allergic rhinitis, unspecified: Secondary | ICD-10-CM | POA: Diagnosis not present

## 2017-12-14 DIAGNOSIS — M810 Age-related osteoporosis without current pathological fracture: Secondary | ICD-10-CM | POA: Diagnosis not present

## 2017-12-14 DIAGNOSIS — Z8543 Personal history of malignant neoplasm of ovary: Secondary | ICD-10-CM | POA: Diagnosis not present

## 2017-12-14 DIAGNOSIS — N189 Chronic kidney disease, unspecified: Secondary | ICD-10-CM | POA: Diagnosis not present

## 2017-12-14 DIAGNOSIS — E782 Mixed hyperlipidemia: Secondary | ICD-10-CM | POA: Diagnosis not present

## 2017-12-14 DIAGNOSIS — G4700 Insomnia, unspecified: Secondary | ICD-10-CM | POA: Diagnosis not present

## 2017-12-14 DIAGNOSIS — I10 Essential (primary) hypertension: Secondary | ICD-10-CM | POA: Diagnosis not present

## 2017-12-14 DIAGNOSIS — D649 Anemia, unspecified: Secondary | ICD-10-CM | POA: Diagnosis not present

## 2017-12-14 DIAGNOSIS — Z79899 Other long term (current) drug therapy: Secondary | ICD-10-CM | POA: Diagnosis not present

## 2017-12-14 DIAGNOSIS — R740 Nonspecific elevation of levels of transaminase and lactic acid dehydrogenase [LDH]: Secondary | ICD-10-CM | POA: Diagnosis not present

## 2017-12-16 DIAGNOSIS — J309 Allergic rhinitis, unspecified: Secondary | ICD-10-CM | POA: Diagnosis not present

## 2017-12-16 DIAGNOSIS — J069 Acute upper respiratory infection, unspecified: Secondary | ICD-10-CM | POA: Diagnosis not present

## 2017-12-21 DIAGNOSIS — M810 Age-related osteoporosis without current pathological fracture: Secondary | ICD-10-CM | POA: Diagnosis not present

## 2018-02-19 DIAGNOSIS — H26492 Other secondary cataract, left eye: Secondary | ICD-10-CM | POA: Diagnosis not present

## 2018-03-04 DIAGNOSIS — K219 Gastro-esophageal reflux disease without esophagitis: Secondary | ICD-10-CM | POA: Diagnosis not present

## 2018-03-04 DIAGNOSIS — M6530 Trigger finger, unspecified finger: Secondary | ICD-10-CM | POA: Diagnosis not present

## 2018-03-16 DIAGNOSIS — L820 Inflamed seborrheic keratosis: Secondary | ICD-10-CM | POA: Diagnosis not present

## 2018-03-16 DIAGNOSIS — L570 Actinic keratosis: Secondary | ICD-10-CM | POA: Diagnosis not present

## 2018-03-16 DIAGNOSIS — Z23 Encounter for immunization: Secondary | ICD-10-CM | POA: Diagnosis not present

## 2018-03-16 DIAGNOSIS — L821 Other seborrheic keratosis: Secondary | ICD-10-CM | POA: Diagnosis not present

## 2018-03-16 DIAGNOSIS — R238 Other skin changes: Secondary | ICD-10-CM | POA: Diagnosis not present

## 2018-04-01 DIAGNOSIS — M65352 Trigger finger, left little finger: Secondary | ICD-10-CM | POA: Diagnosis not present

## 2018-04-01 DIAGNOSIS — J3489 Other specified disorders of nose and nasal sinuses: Secondary | ICD-10-CM | POA: Diagnosis not present

## 2018-04-01 DIAGNOSIS — K219 Gastro-esophageal reflux disease without esophagitis: Secondary | ICD-10-CM | POA: Diagnosis not present

## 2018-04-01 DIAGNOSIS — M810 Age-related osteoporosis without current pathological fracture: Secondary | ICD-10-CM | POA: Diagnosis not present

## 2018-04-05 DIAGNOSIS — H40013 Open angle with borderline findings, low risk, bilateral: Secondary | ICD-10-CM | POA: Diagnosis not present

## 2018-04-05 DIAGNOSIS — H34832 Tributary (branch) retinal vein occlusion, left eye, with macular edema: Secondary | ICD-10-CM | POA: Diagnosis not present

## 2018-04-05 DIAGNOSIS — Z961 Presence of intraocular lens: Secondary | ICD-10-CM | POA: Diagnosis not present

## 2018-04-05 DIAGNOSIS — H25811 Combined forms of age-related cataract, right eye: Secondary | ICD-10-CM | POA: Diagnosis not present

## 2018-04-05 DIAGNOSIS — H16223 Keratoconjunctivitis sicca, not specified as Sjogren's, bilateral: Secondary | ICD-10-CM | POA: Diagnosis not present

## 2018-04-05 DIAGNOSIS — H1851 Endothelial corneal dystrophy: Secondary | ICD-10-CM | POA: Diagnosis not present

## 2018-04-07 DIAGNOSIS — Z96653 Presence of artificial knee joint, bilateral: Secondary | ICD-10-CM | POA: Diagnosis not present

## 2018-04-07 DIAGNOSIS — M810 Age-related osteoporosis without current pathological fracture: Secondary | ICD-10-CM | POA: Diagnosis not present

## 2018-04-07 DIAGNOSIS — Z8262 Family history of osteoporosis: Secondary | ICD-10-CM | POA: Diagnosis not present

## 2018-04-07 DIAGNOSIS — Z8543 Personal history of malignant neoplasm of ovary: Secondary | ICD-10-CM | POA: Diagnosis not present

## 2018-04-07 DIAGNOSIS — Z1231 Encounter for screening mammogram for malignant neoplasm of breast: Secondary | ICD-10-CM | POA: Diagnosis not present

## 2018-04-07 DIAGNOSIS — Z90710 Acquired absence of both cervix and uterus: Secondary | ICD-10-CM | POA: Diagnosis not present

## 2018-04-08 DIAGNOSIS — M65352 Trigger finger, left little finger: Secondary | ICD-10-CM | POA: Diagnosis not present

## 2018-04-22 DIAGNOSIS — Z124 Encounter for screening for malignant neoplasm of cervix: Secondary | ICD-10-CM | POA: Diagnosis not present

## 2018-04-22 DIAGNOSIS — Z01419 Encounter for gynecological examination (general) (routine) without abnormal findings: Secondary | ICD-10-CM | POA: Diagnosis not present

## 2018-06-22 DIAGNOSIS — M810 Age-related osteoporosis without current pathological fracture: Secondary | ICD-10-CM | POA: Diagnosis not present

## 2018-07-01 DIAGNOSIS — R350 Frequency of micturition: Secondary | ICD-10-CM | POA: Diagnosis not present

## 2018-07-29 DIAGNOSIS — R2681 Unsteadiness on feet: Secondary | ICD-10-CM | POA: Diagnosis not present

## 2018-07-29 DIAGNOSIS — Z96652 Presence of left artificial knee joint: Secondary | ICD-10-CM | POA: Diagnosis not present

## 2018-07-29 DIAGNOSIS — M545 Low back pain: Secondary | ICD-10-CM | POA: Diagnosis not present

## 2018-07-29 DIAGNOSIS — M6281 Muscle weakness (generalized): Secondary | ICD-10-CM | POA: Diagnosis not present

## 2018-08-04 DIAGNOSIS — M6281 Muscle weakness (generalized): Secondary | ICD-10-CM | POA: Diagnosis not present

## 2018-08-04 DIAGNOSIS — R2681 Unsteadiness on feet: Secondary | ICD-10-CM | POA: Diagnosis not present

## 2018-08-04 DIAGNOSIS — Z96652 Presence of left artificial knee joint: Secondary | ICD-10-CM | POA: Diagnosis not present

## 2018-08-04 DIAGNOSIS — M545 Low back pain: Secondary | ICD-10-CM | POA: Diagnosis not present

## 2018-08-09 DIAGNOSIS — Z96652 Presence of left artificial knee joint: Secondary | ICD-10-CM | POA: Diagnosis not present

## 2018-08-09 DIAGNOSIS — M6281 Muscle weakness (generalized): Secondary | ICD-10-CM | POA: Diagnosis not present

## 2018-08-09 DIAGNOSIS — R2681 Unsteadiness on feet: Secondary | ICD-10-CM | POA: Diagnosis not present

## 2018-08-09 DIAGNOSIS — M545 Low back pain: Secondary | ICD-10-CM | POA: Diagnosis not present

## 2018-08-26 DIAGNOSIS — R2681 Unsteadiness on feet: Secondary | ICD-10-CM | POA: Diagnosis not present

## 2018-08-26 DIAGNOSIS — Z96652 Presence of left artificial knee joint: Secondary | ICD-10-CM | POA: Diagnosis not present

## 2018-08-26 DIAGNOSIS — Z9181 History of falling: Secondary | ICD-10-CM | POA: Diagnosis not present

## 2018-08-26 DIAGNOSIS — M6281 Muscle weakness (generalized): Secondary | ICD-10-CM | POA: Diagnosis not present

## 2018-08-26 DIAGNOSIS — M545 Low back pain: Secondary | ICD-10-CM | POA: Diagnosis not present

## 2018-09-08 DIAGNOSIS — M545 Low back pain: Secondary | ICD-10-CM | POA: Diagnosis not present

## 2018-09-08 DIAGNOSIS — Z96652 Presence of left artificial knee joint: Secondary | ICD-10-CM | POA: Diagnosis not present

## 2018-09-08 DIAGNOSIS — M6281 Muscle weakness (generalized): Secondary | ICD-10-CM | POA: Diagnosis not present

## 2018-09-08 DIAGNOSIS — R2681 Unsteadiness on feet: Secondary | ICD-10-CM | POA: Diagnosis not present

## 2018-09-08 DIAGNOSIS — Z9181 History of falling: Secondary | ICD-10-CM | POA: Diagnosis not present

## 2018-09-15 DIAGNOSIS — Z96652 Presence of left artificial knee joint: Secondary | ICD-10-CM | POA: Diagnosis not present

## 2018-09-15 DIAGNOSIS — M6281 Muscle weakness (generalized): Secondary | ICD-10-CM | POA: Diagnosis not present

## 2018-09-15 DIAGNOSIS — R2681 Unsteadiness on feet: Secondary | ICD-10-CM | POA: Diagnosis not present

## 2018-09-15 DIAGNOSIS — S72002D Fracture of unspecified part of neck of left femur, subsequent encounter for closed fracture with routine healing: Secondary | ICD-10-CM | POA: Diagnosis not present

## 2018-09-15 DIAGNOSIS — M545 Low back pain: Secondary | ICD-10-CM | POA: Diagnosis not present

## 2018-09-17 ENCOUNTER — Telehealth: Payer: Self-pay | Admitting: Podiatry

## 2018-09-17 NOTE — Telephone Encounter (Signed)
Pt called stating she is having left foot pain and would like to know what she can do in the meantime to help with the pain. She is scheduled for an appt on 8/11.

## 2018-09-17 NOTE — Telephone Encounter (Signed)
I told pt that you can't go wrong with resting, decreasing activity and ice 3-4 times a day for 15 minutes by putting a ice pack on the floor and covering with a towel to protect the skin. Pt states the physical therapist at her assisted living stated to roll the foot on a frozen bottle. I told pt that could work.

## 2018-09-21 ENCOUNTER — Ambulatory Visit: Payer: Medicare Other

## 2018-09-21 ENCOUNTER — Encounter: Payer: Medicare Other | Admitting: Podiatry

## 2018-09-21 DIAGNOSIS — M778 Other enthesopathies, not elsewhere classified: Secondary | ICD-10-CM

## 2018-09-21 NOTE — Progress Notes (Signed)
This encounter was created in error - please disregard.

## 2018-09-22 DIAGNOSIS — Z96652 Presence of left artificial knee joint: Secondary | ICD-10-CM | POA: Diagnosis not present

## 2018-09-22 DIAGNOSIS — M545 Low back pain: Secondary | ICD-10-CM | POA: Diagnosis not present

## 2018-09-22 DIAGNOSIS — S72002D Fracture of unspecified part of neck of left femur, subsequent encounter for closed fracture with routine healing: Secondary | ICD-10-CM | POA: Diagnosis not present

## 2018-09-22 DIAGNOSIS — R2681 Unsteadiness on feet: Secondary | ICD-10-CM | POA: Diagnosis not present

## 2018-09-22 DIAGNOSIS — M6281 Muscle weakness (generalized): Secondary | ICD-10-CM | POA: Diagnosis not present

## 2018-09-23 ENCOUNTER — Ambulatory Visit: Payer: Medicare Other

## 2018-09-23 ENCOUNTER — Encounter: Payer: Medicare Other | Admitting: Podiatry

## 2018-09-27 DIAGNOSIS — Z96652 Presence of left artificial knee joint: Secondary | ICD-10-CM | POA: Diagnosis not present

## 2018-09-27 DIAGNOSIS — S72002D Fracture of unspecified part of neck of left femur, subsequent encounter for closed fracture with routine healing: Secondary | ICD-10-CM | POA: Diagnosis not present

## 2018-09-27 DIAGNOSIS — R2681 Unsteadiness on feet: Secondary | ICD-10-CM | POA: Diagnosis not present

## 2018-09-27 DIAGNOSIS — M545 Low back pain: Secondary | ICD-10-CM | POA: Diagnosis not present

## 2018-09-27 DIAGNOSIS — M6281 Muscle weakness (generalized): Secondary | ICD-10-CM | POA: Diagnosis not present

## 2018-09-29 DIAGNOSIS — S72002D Fracture of unspecified part of neck of left femur, subsequent encounter for closed fracture with routine healing: Secondary | ICD-10-CM | POA: Diagnosis not present

## 2018-09-29 DIAGNOSIS — M6281 Muscle weakness (generalized): Secondary | ICD-10-CM | POA: Diagnosis not present

## 2018-09-29 DIAGNOSIS — Z96652 Presence of left artificial knee joint: Secondary | ICD-10-CM | POA: Diagnosis not present

## 2018-09-29 DIAGNOSIS — M545 Low back pain: Secondary | ICD-10-CM | POA: Diagnosis not present

## 2018-09-29 DIAGNOSIS — R2681 Unsteadiness on feet: Secondary | ICD-10-CM | POA: Diagnosis not present

## 2018-10-05 ENCOUNTER — Ambulatory Visit (INDEPENDENT_AMBULATORY_CARE_PROVIDER_SITE_OTHER): Payer: Medicare Other | Admitting: Podiatry

## 2018-10-05 ENCOUNTER — Ambulatory Visit: Payer: Medicare Other

## 2018-10-05 ENCOUNTER — Other Ambulatory Visit: Payer: Self-pay

## 2018-10-05 ENCOUNTER — Encounter: Payer: Self-pay | Admitting: Podiatry

## 2018-10-05 VITALS — Temp 98.3°F

## 2018-10-05 DIAGNOSIS — M779 Enthesopathy, unspecified: Secondary | ICD-10-CM | POA: Diagnosis not present

## 2018-10-05 DIAGNOSIS — B351 Tinea unguium: Secondary | ICD-10-CM

## 2018-10-05 DIAGNOSIS — M79676 Pain in unspecified toe(s): Secondary | ICD-10-CM

## 2018-10-05 DIAGNOSIS — M778 Other enthesopathies, not elsewhere classified: Secondary | ICD-10-CM

## 2018-10-06 ENCOUNTER — Encounter: Payer: Self-pay | Admitting: Podiatry

## 2018-10-06 DIAGNOSIS — M545 Low back pain: Secondary | ICD-10-CM | POA: Diagnosis not present

## 2018-10-06 DIAGNOSIS — S72002D Fracture of unspecified part of neck of left femur, subsequent encounter for closed fracture with routine healing: Secondary | ICD-10-CM | POA: Diagnosis not present

## 2018-10-06 DIAGNOSIS — R2681 Unsteadiness on feet: Secondary | ICD-10-CM | POA: Diagnosis not present

## 2018-10-06 DIAGNOSIS — M6281 Muscle weakness (generalized): Secondary | ICD-10-CM | POA: Diagnosis not present

## 2018-10-06 DIAGNOSIS — Z96652 Presence of left artificial knee joint: Secondary | ICD-10-CM | POA: Diagnosis not present

## 2018-10-06 NOTE — Progress Notes (Signed)
She presents today chief complaint of painful elongated toenails 1 through 5 bilaterally.  She has pain about the first metatarsal phalangeal joint of the right foot.  States that his right and here she points to the interspace around the fibular sesamoid.  She denies any trauma.  Denies calf pain chest pain shortness of breath.  Objective: Vital signs are stable she is alert and oriented x3.  Pulses are palpable.  She has pain on palpation to the first intermetatarsal space around the fibular sesamoid no tenderness on range of motion of the toe however.  Her toenails are long thick yellow dystrophic onychomycotic no open lesions or wounds no abrasions.  Assessment: Sesamoiditis first metatarsal space capsulitis right.  Pain Secondary to onychomycosis.  Plan: After sterile Betadine skin prep I injected 10 mg of Kenalog and 5 mg of Marcaine point maximal tenderness from the dorsal aspect to the fibular sesamoid area.  Tolerated seizure well without complications  Debrided toenails 1 through 5 bilateral covered service secondary to pain follow-up with her in 2-1/2 to 3 months if necessary.

## 2018-10-08 DIAGNOSIS — L570 Actinic keratosis: Secondary | ICD-10-CM | POA: Diagnosis not present

## 2018-10-08 DIAGNOSIS — L821 Other seborrheic keratosis: Secondary | ICD-10-CM | POA: Diagnosis not present

## 2018-10-13 DIAGNOSIS — R2681 Unsteadiness on feet: Secondary | ICD-10-CM | POA: Diagnosis not present

## 2018-10-13 DIAGNOSIS — M6281 Muscle weakness (generalized): Secondary | ICD-10-CM | POA: Diagnosis not present

## 2018-10-13 DIAGNOSIS — M545 Low back pain: Secondary | ICD-10-CM | POA: Diagnosis not present

## 2018-10-13 DIAGNOSIS — Z96652 Presence of left artificial knee joint: Secondary | ICD-10-CM | POA: Diagnosis not present

## 2018-10-20 DIAGNOSIS — M545 Low back pain: Secondary | ICD-10-CM | POA: Diagnosis not present

## 2018-10-20 DIAGNOSIS — Z96652 Presence of left artificial knee joint: Secondary | ICD-10-CM | POA: Diagnosis not present

## 2018-10-20 DIAGNOSIS — R2681 Unsteadiness on feet: Secondary | ICD-10-CM | POA: Diagnosis not present

## 2018-10-20 DIAGNOSIS — M6281 Muscle weakness (generalized): Secondary | ICD-10-CM | POA: Diagnosis not present

## 2018-10-27 DIAGNOSIS — R2681 Unsteadiness on feet: Secondary | ICD-10-CM | POA: Diagnosis not present

## 2018-10-27 DIAGNOSIS — Z96652 Presence of left artificial knee joint: Secondary | ICD-10-CM | POA: Diagnosis not present

## 2018-10-27 DIAGNOSIS — M545 Low back pain: Secondary | ICD-10-CM | POA: Diagnosis not present

## 2018-10-27 DIAGNOSIS — M6281 Muscle weakness (generalized): Secondary | ICD-10-CM | POA: Diagnosis not present

## 2018-11-23 DIAGNOSIS — L570 Actinic keratosis: Secondary | ICD-10-CM | POA: Diagnosis not present

## 2018-11-23 DIAGNOSIS — L820 Inflamed seborrheic keratosis: Secondary | ICD-10-CM | POA: Diagnosis not present

## 2018-11-23 DIAGNOSIS — L304 Erythema intertrigo: Secondary | ICD-10-CM | POA: Diagnosis not present

## 2018-11-23 DIAGNOSIS — L814 Other melanin hyperpigmentation: Secondary | ICD-10-CM | POA: Diagnosis not present

## 2018-11-23 NOTE — Progress Notes (Signed)
This encounter was created in error - please disregard.

## 2018-12-06 DIAGNOSIS — C569 Malignant neoplasm of unspecified ovary: Secondary | ICD-10-CM | POA: Diagnosis not present

## 2018-12-15 DIAGNOSIS — R011 Cardiac murmur, unspecified: Secondary | ICD-10-CM | POA: Diagnosis not present

## 2018-12-15 DIAGNOSIS — Z8543 Personal history of malignant neoplasm of ovary: Secondary | ICD-10-CM | POA: Diagnosis not present

## 2018-12-15 DIAGNOSIS — N189 Chronic kidney disease, unspecified: Secondary | ICD-10-CM | POA: Diagnosis not present

## 2018-12-15 DIAGNOSIS — M810 Age-related osteoporosis without current pathological fracture: Secondary | ICD-10-CM | POA: Diagnosis not present

## 2018-12-15 DIAGNOSIS — I10 Essential (primary) hypertension: Secondary | ICD-10-CM | POA: Diagnosis not present

## 2018-12-15 DIAGNOSIS — E782 Mixed hyperlipidemia: Secondary | ICD-10-CM | POA: Diagnosis not present

## 2018-12-15 DIAGNOSIS — Z0000 Encounter for general adult medical examination without abnormal findings: Secondary | ICD-10-CM | POA: Diagnosis not present

## 2018-12-15 DIAGNOSIS — K219 Gastro-esophageal reflux disease without esophagitis: Secondary | ICD-10-CM | POA: Diagnosis not present

## 2018-12-15 DIAGNOSIS — J309 Allergic rhinitis, unspecified: Secondary | ICD-10-CM | POA: Diagnosis not present

## 2018-12-15 DIAGNOSIS — G4700 Insomnia, unspecified: Secondary | ICD-10-CM | POA: Diagnosis not present

## 2018-12-22 ENCOUNTER — Other Ambulatory Visit: Payer: Self-pay | Admitting: Family Medicine

## 2018-12-22 DIAGNOSIS — R945 Abnormal results of liver function studies: Secondary | ICD-10-CM

## 2018-12-22 DIAGNOSIS — R7989 Other specified abnormal findings of blood chemistry: Secondary | ICD-10-CM

## 2018-12-24 DIAGNOSIS — M810 Age-related osteoporosis without current pathological fracture: Secondary | ICD-10-CM | POA: Diagnosis not present

## 2018-12-27 ENCOUNTER — Telehealth: Payer: Self-pay | Admitting: Cardiology

## 2018-12-27 NOTE — Telephone Encounter (Signed)
Spoke with patient and she has no memory issues but does use a walker to get around. She is wanting daughter with her secondary to age and walker use. Will forward to Dr Gardiner Rhyme for review

## 2018-12-27 NOTE — Telephone Encounter (Signed)
Patient is requesting her daughter, Almyra Free, come with her to her appt on 12/30/18.

## 2018-12-28 NOTE — Telephone Encounter (Signed)
Pt updated and voiced understanding.  

## 2018-12-28 NOTE — Telephone Encounter (Signed)
Left message to call back  

## 2018-12-28 NOTE — Telephone Encounter (Signed)
I am fine with her daughter coming to the appointment

## 2018-12-30 ENCOUNTER — Encounter: Payer: Self-pay | Admitting: Cardiology

## 2018-12-30 ENCOUNTER — Ambulatory Visit (INDEPENDENT_AMBULATORY_CARE_PROVIDER_SITE_OTHER): Payer: Medicare Other | Admitting: Cardiology

## 2018-12-30 ENCOUNTER — Other Ambulatory Visit: Payer: Self-pay

## 2018-12-30 VITALS — BP 144/75 | HR 73 | Ht 63.0 in | Wt 193.6 lb

## 2018-12-30 DIAGNOSIS — R0602 Shortness of breath: Secondary | ICD-10-CM | POA: Diagnosis not present

## 2018-12-30 DIAGNOSIS — R011 Cardiac murmur, unspecified: Secondary | ICD-10-CM | POA: Diagnosis not present

## 2018-12-30 DIAGNOSIS — I10 Essential (primary) hypertension: Secondary | ICD-10-CM | POA: Diagnosis not present

## 2018-12-30 NOTE — Progress Notes (Signed)
Cardiology Office Note:    Date:  12/30/2018   ID:  ERNIE TUCCIO, DOB 1930/01/17, MRN [id number]  PCP:  Hulan Fess, MD  Cardiologist:  No primary care provider on file.  Electrophysiologist:  None   Referring MD: Hulan Fess, MD   Chief Complaint  Patient presents with  . Heart Murmur    History of Present Illness:    [name] is a 85 y.o. female with a hx of ovarian cancer, CKD, hyperlipidemia, hypertension, obesity who is referred by Dr. Rex Kras for evaluation of heart murmur.    She reports that she has been told for years she has a heart murmur.  [facility] in [facility] community.  She walks with a walker, will go on walks up to 30 minutes/day.  She denies any exertional chest pain.  Does report that she gets some shortness of breath with exertion.  Denies any lower extremity edema.  Does not report regular palpitations, but does report occasional feeling that her heart is skipping a beat.  This lasts for a second and resolves.    Past Medical History:  Diagnosis Date  . Anemia    hx of   . Anemia associated with acute blood loss 04/03/2011  . Arthritis    knees   . ARTHRITIS, SEPTIC 05/28/2009   Qualifier: Diagnosis of  By: Tommy Medal MD, Roderic Scarce    . Ascites   . Blood transfusion    hx of 2011   . Complication of anesthesia    Sodium drops per pt   . Cystitis   . DIARRHEA, ANTIBIOTIC ASSOCIATED 05/31/2009   Qualifier: Diagnosis of  By: Tommy Medal MD, Roderic Scarce    . GERD (gastroesophageal reflux disease)   . H/O hiatal hernia   . Hyperlipidemia   . Hypertension   . Hyponatremia   . Neutropenia with fever ([place]) 05/18/2011  . OSTEOMYELITIS, CHRONIC, LOWER LEG 04/23/2009   Qualifier: Diagnosis of  By: Johnnye Sima MD, Dellis Filbert    . Osteopenia   . OSTEOPOROSIS 04/23/2009   Qualifier: Diagnosis of  By: Johnnye Sima MD, Dellis Filbert    . Ovarian cancer ([place]) 01/25/2011  . Pneumonia    hx of   . Postmenopausal atrophic vaginitis   . PROSTHETIC JOINT COMPLICATION [id number]   Qualifier: Diagnosis of  By: Tommy Medal MD, Roderic Scarce    . Renal disorder    Decreased kidney function  . Staph infection 2010   after knee replacement  . Urinary frequency   . Vulvitis     Past Surgical History:  Procedure Laterality Date  . ABDOMINAL HYSTERECTOMY  04/01/2011   Procedure: HYSTERECTOMY ABDOMINAL;  Surgeon: Alvino Chapel, MD;  Location: [facility];  Service: Gynecology;  Laterality: N/A;  . APPENDECTOMY    . JOINT REPLACEMENT     R knee in 2008, 5 operations on L knee  . LAPAROTOMY  04/01/2011   Procedure: EXPLORATORY LAPAROTOMY;  Surgeon: Alvino Chapel, MD;  Location: [facility];  Service: Gynecology;  Laterality: N/A;  . OTHER SURGICAL HISTORY     hx of C section 1966  . SALPINGOOPHORECTOMY  04/01/2011   Procedure: SALPINGO OOPHERECTOMY;  Surgeon: Alvino Chapel, MD;  Location: [facility];  Service: Gynecology;  Laterality: Bilateral;  . TONSILLECTOMY      Current Medications: Current Meds  Medication Sig  . amLODipine (NORVASC) 5 MG tablet Take 5 mg by mouth daily.  Marland Kitchen azelastine (ASTELIN) 0.1 % nasal spray Place 1 spray into both nostrils 2 (two) times daily as  needed.   . B Complex Vitamins (VITAMIN B COMPLEX PO) Take 1 tablet by mouth daily.   . calcium carbonate (TUMS EX) 750 MG chewable tablet Chew 1 tablet by mouth daily.  . Cholecalciferol (VITAMIN D) 2000 UNITS tablet Take 2,000 Units by mouth daily.   Marland Kitchen denosumab (PROLIA) 60 MG/ML SOSY injection Inject 60 mg into the skin every 6 (six) months.  . famotidine (PEPCID) 10 MG tablet Take 10 mg by mouth daily as needed for heartburn or indigestion.  . fenofibrate (TRICOR) 145 MG tablet Take 145 mg by mouth daily. with food  . lisinopril (ZESTRIL) 20 MG tablet Take 20 mg by mouth daily.  . Multiple Vitamins-Minerals (CENTRUM SILVER 50+MEN PO) Take 1 tablet by mouth daily  . nadolol (CORGARD) 20 MG tablet Take 10 mg by mouth daily  . omega-3 acid ethyl esters (LOVAZA) 1 g capsule 1 tablet  .  omeprazole (PRILOSEC) 20 MG capsule Take 20 mg by mouth 2 (two) times daily before a meal.   . Polyethyl Glycol-Propyl Glycol (SYSTANE) 0.4-0.3 % SOLN 1 drop into affected eye as needed  . Red Yeast Rice 600 MG CAPS 1 tablet by mouth twice daily  . vitamin E 400 UNIT capsule 1 capsule  . zolpidem (AMBIEN) 10 MG tablet take 5-10 mg by mouth as needed for sleep     Allergies:   Morphine and related, Penicillins, and Shellfish allergy   Social History   Socioeconomic History  . Marital status: Widowed    Spouse name: Not on file  . Number of children: Not on file  . Years of education: Not on file  . Highest education level: Not on file  Occupational History  . Not on file  Social Needs  . Financial resource strain: Not on file  . Food insecurity    Worry: Not on file    Inability: Not on file  . Transportation needs    Medical: Not on file    Non-medical: Not on file  Tobacco Use  . Smoking status: Former Smoker    Quit date: 02/11/1952    Years since quitting: 66.9  . Smokeless tobacco: Never Used  Substance and Sexual Activity  . Alcohol use: Not Currently  . Drug use: Never  . Sexual activity: Never  Lifestyle  . Physical activity    Days per week: Not on file    Minutes per session: Not on file  . Stress: Not on file  Relationships  . Social Herbalist on phone: Not on file    Gets together: Not on file    Attends religious service: Not on file    Active member of club or organization: Not on file    Attends meetings of clubs or organizations: Not on file    Relationship status: Not on file  Other Topics Concern  . Not on file  Social History Narrative  . Not on file     Family History: The patient's family history includes Cancer in an other family member; Diabetes in her brother; Heart attack in her brother.  ROS:   Please see the history of present illness.    All other systems reviewed and are negative.  EKGs/Labs/Other Studies Reviewed:     The following studies were reviewed today:   EKG:  EKG is ordered today.  The ekg ordered today demonstrates normal sinus rhythm, rate 73, low voltage in precordial leads, no ST/T abnormalities  Recent Labs: No results found for requested  labs within last 8760 hours.  Recent Lipid Panel No results found for: CHOL, TRIG, HDL, CHOLHDL, VLDL, LDLCALC, LDLDIRECT  Physical Exam:    VS:  BP (!) 144/75   Pulse 73   Ht 5\' 3"  (1.6 m)   Wt 193 lb 9.6 oz (87.8 kg)   SpO2 99%   BMI 34.29 kg/m     Wt Readings from Last 3 Encounters:  12/30/18 193 lb 9.6 oz (87.8 kg)  08/06/16 180 lb (81.6 kg)  01/30/16 180 lb (81.6 kg)     GEN: d in no acute distress HEENT: Normal NECK: No JVD LYMPHATICS: No lymphadenopathy CARDIAC: RRR, 2/6 systolic murmur RESPIRATORY:  Clear to auscultation without rales, wheezing or rhonchi  ABDOMEN: Soft, non-tender, non-distended MUSCULOSKELETAL: Trace edema SKIN: Warm and dry NEUROLOGIC:  Alert and oriented x 3 PSYCHIATRIC:  Normal affect   ASSESSMENT:    1. SOB (shortness of breath)   2. Murmur   3. Essential hypertension    PLAN:    In order of problems listed above:  Dyspnea on exertion: Could be related to deconditioning, will check echocardiogram to rule out structural heart disease  Systolic heart murmur: will check echocardiogram  Hypertension: On nadolol 20 mg daily, amlodipine 5 mg daily, lisinopril 20 mg daily.   RTC in 1 year  Medication Adjustments/Labs and Tests Ordered: Current medicines are reviewed at length with the patient today.  Concerns regarding medicines are outlined above.  Orders Placed This Encounter  Procedures  . ECHOCARDIOGRAM COMPLETE   No orders of the defined types were placed in this encounter.   Patient Instructions  Medication Instructions:  Your Physician recommend you continue on your current medication as directed.    *If you need a refill on your cardiac medications before your next appointment,  please call your pharmacy*  Lab Work: None  Testing/Procedures: Your physician has requested that you have an echocardiogram. Echocardiography is a painless test that uses sound waves to create images of your heart. It provides your doctor with information about the size and shape of your heart and how well your heart's chambers and valves are working. This procedure takes approximately one hour. There are no restrictions for this procedure. [place] [address]   Follow-Up: At [facility], you and your health needs are our priority.  As part of our continuing mission to provide you with exceptional heart care, we have created designated Provider Care Teams.  These Care Teams include your primary Cardiologist (physician) and Advanced Practice Providers (APPs -  Physician Assistants and Nurse Practitioners) who all work together to provide you with the care you need, when you need it.  Your next appointment:   1 year(s)  The format for your next appointment:   In Person  Provider:   Oswaldo Milian, MD       Signed, Donato Heinz, MD  12/30/2018 3:05 PM    [place]

## 2018-12-30 NOTE — Patient Instructions (Signed)
Medication Instructions:  Your Physician recommend you continue on your current medication as directed.    *If you need a refill on your cardiac medications before your next appointment, please call your pharmacy*  Lab Work: None  Testing/Procedures: Your physician has requested that you have an echocardiogram. Echocardiography is a painless test that uses sound waves to create images of your heart. It provides your doctor with information about the size and shape of your heart and how well your heart's chambers and valves are working. This procedure takes approximately one hour. There are no restrictions for this procedure. [place] [address]   Follow-Up: At [facility], you and your health needs are our priority.  As part of our continuing mission to provide you with exceptional heart care, we have created designated Provider Care Teams.  These Care Teams include your primary Cardiologist (physician) and Advanced Practice Providers (APPs -  Physician Assistants and Nurse Practitioners) who all work together to provide you with the care you need, when you need it.  Your next appointment:   1 year(s)  The format for your next appointment:   In Person  Provider:   Oswaldo Milian, MD

## 2019-01-04 ENCOUNTER — Ambulatory Visit (HOSPITAL_COMMUNITY): Payer: Medicare Other | Attending: Cardiology

## 2019-01-04 ENCOUNTER — Ambulatory Visit
Admission: RE | Admit: 2019-01-04 | Discharge: 2019-01-04 | Disposition: A | Discharge status: Home / Self Care | Payer: Medicare Other | Source: Ambulatory Visit | Attending: Family Medicine | Admitting: Family Medicine

## 2019-01-04 ENCOUNTER — Other Ambulatory Visit: Payer: Self-pay

## 2019-01-04 DIAGNOSIS — R011 Cardiac murmur, unspecified: Secondary | ICD-10-CM

## 2019-01-04 DIAGNOSIS — R0602 Shortness of breath: Secondary | ICD-10-CM | POA: Diagnosis not present

## 2019-01-04 DIAGNOSIS — R7989 Other specified abnormal findings of blood chemistry: Secondary | ICD-10-CM

## 2019-01-04 DIAGNOSIS — R945 Abnormal results of liver function studies: Secondary | ICD-10-CM

## 2019-01-05 NOTE — Addendum Note (Signed)
Addended by: Waylan Rocher on: 01/05/2019 11:45 AM   Modules accepted: Orders

## 2019-01-18 DIAGNOSIS — L821 Other seborrheic keratosis: Secondary | ICD-10-CM | POA: Diagnosis not present

## 2019-01-18 DIAGNOSIS — L814 Other melanin hyperpigmentation: Secondary | ICD-10-CM | POA: Diagnosis not present

## 2019-01-18 DIAGNOSIS — L304 Erythema intertrigo: Secondary | ICD-10-CM | POA: Diagnosis not present

## 2019-01-18 DIAGNOSIS — L570 Actinic keratosis: Secondary | ICD-10-CM | POA: Diagnosis not present

## 2019-01-18 DIAGNOSIS — L853 Xerosis cutis: Secondary | ICD-10-CM | POA: Diagnosis not present

## 2019-02-14 DIAGNOSIS — Z23 Encounter for immunization: Secondary | ICD-10-CM | POA: Diagnosis not present

## 2019-02-21 DIAGNOSIS — H3562 Retinal hemorrhage, left eye: Secondary | ICD-10-CM | POA: Diagnosis not present

## 2019-02-21 DIAGNOSIS — H25011 Cortical age-related cataract, right eye: Secondary | ICD-10-CM | POA: Diagnosis not present

## 2019-02-21 DIAGNOSIS — H2511 Age-related nuclear cataract, right eye: Secondary | ICD-10-CM | POA: Diagnosis not present

## 2019-02-21 DIAGNOSIS — H348322 Tributary (branch) retinal vein occlusion, left eye, stable: Secondary | ICD-10-CM | POA: Diagnosis not present

## 2019-03-14 DIAGNOSIS — Z23 Encounter for immunization: Secondary | ICD-10-CM | POA: Diagnosis not present

## 2019-04-12 DIAGNOSIS — R5383 Other fatigue: Secondary | ICD-10-CM | POA: Diagnosis not present

## 2019-04-12 DIAGNOSIS — R1084 Generalized abdominal pain: Secondary | ICD-10-CM | POA: Diagnosis not present

## 2019-04-12 DIAGNOSIS — Z8543 Personal history of malignant neoplasm of ovary: Secondary | ICD-10-CM | POA: Diagnosis not present

## 2019-04-12 DIAGNOSIS — C569 Malignant neoplasm of unspecified ovary: Secondary | ICD-10-CM | POA: Diagnosis not present

## 2019-04-27 DIAGNOSIS — L578 Other skin changes due to chronic exposure to nonionizing radiation: Secondary | ICD-10-CM | POA: Diagnosis not present

## 2019-04-27 DIAGNOSIS — L570 Actinic keratosis: Secondary | ICD-10-CM | POA: Diagnosis not present

## 2019-04-27 DIAGNOSIS — Z23 Encounter for immunization: Secondary | ICD-10-CM | POA: Diagnosis not present

## 2019-04-27 DIAGNOSIS — L821 Other seborrheic keratosis: Secondary | ICD-10-CM | POA: Diagnosis not present

## 2019-06-22 DIAGNOSIS — Z124 Encounter for screening for malignant neoplasm of cervix: Secondary | ICD-10-CM | POA: Diagnosis not present

## 2019-06-22 DIAGNOSIS — Z01419 Encounter for gynecological examination (general) (routine) without abnormal findings: Secondary | ICD-10-CM | POA: Diagnosis not present

## 2019-06-24 DIAGNOSIS — M8580 Other specified disorders of bone density and structure, unspecified site: Secondary | ICD-10-CM | POA: Diagnosis not present

## 2019-07-08 DIAGNOSIS — I10 Essential (primary) hypertension: Secondary | ICD-10-CM | POA: Diagnosis not present

## 2019-07-08 DIAGNOSIS — H6590 Unspecified nonsuppurative otitis media, unspecified ear: Secondary | ICD-10-CM | POA: Diagnosis not present

## 2019-07-08 DIAGNOSIS — J302 Other seasonal allergic rhinitis: Secondary | ICD-10-CM | POA: Diagnosis not present

## 2019-07-27 DIAGNOSIS — G4700 Insomnia, unspecified: Secondary | ICD-10-CM | POA: Diagnosis not present

## 2019-07-27 DIAGNOSIS — R410 Disorientation, unspecified: Secondary | ICD-10-CM | POA: Diagnosis not present

## 2019-07-27 DIAGNOSIS — R945 Abnormal results of liver function studies: Secondary | ICD-10-CM | POA: Diagnosis not present

## 2019-07-27 DIAGNOSIS — R7401 Elevation of levels of liver transaminase levels: Secondary | ICD-10-CM | POA: Diagnosis not present

## 2019-07-27 DIAGNOSIS — I10 Essential (primary) hypertension: Secondary | ICD-10-CM | POA: Diagnosis not present

## 2019-07-27 DIAGNOSIS — Z79899 Other long term (current) drug therapy: Secondary | ICD-10-CM | POA: Diagnosis not present

## 2019-07-27 DIAGNOSIS — K824 Cholesterolosis of gallbladder: Secondary | ICD-10-CM | POA: Diagnosis not present

## 2019-08-04 ENCOUNTER — Emergency Department (HOSPITAL_COMMUNITY)
Admission: EM | Admit: 2019-08-04 | Discharge: 2019-08-04 | Disposition: A | Discharge status: Home / Self Care | Payer: Medicare Other | Attending: Emergency Medicine | Admitting: Emergency Medicine

## 2019-08-04 ENCOUNTER — Emergency Department (HOSPITAL_COMMUNITY): Payer: Medicare Other

## 2019-08-04 ENCOUNTER — Other Ambulatory Visit: Payer: Self-pay

## 2019-08-04 DIAGNOSIS — I129 Hypertensive chronic kidney disease with stage 1 through stage 4 chronic kidney disease, or unspecified chronic kidney disease: Secondary | ICD-10-CM | POA: Insufficient documentation

## 2019-08-04 DIAGNOSIS — W19XXXA Unspecified fall, initial encounter: Secondary | ICD-10-CM

## 2019-08-04 DIAGNOSIS — Z8543 Personal history of malignant neoplasm of ovary: Secondary | ICD-10-CM | POA: Diagnosis not present

## 2019-08-04 DIAGNOSIS — Y9301 Activity, walking, marching and hiking: Secondary | ICD-10-CM | POA: Diagnosis not present

## 2019-08-04 DIAGNOSIS — N183 Chronic kidney disease, stage 3 unspecified: Secondary | ICD-10-CM | POA: Insufficient documentation

## 2019-08-04 DIAGNOSIS — Z79899 Other long term (current) drug therapy: Secondary | ICD-10-CM | POA: Diagnosis not present

## 2019-08-04 DIAGNOSIS — Z87891 Personal history of nicotine dependence: Secondary | ICD-10-CM | POA: Insufficient documentation

## 2019-08-04 DIAGNOSIS — Z96641 Presence of right artificial hip joint: Secondary | ICD-10-CM | POA: Diagnosis not present

## 2019-08-04 DIAGNOSIS — Y998 Other external cause status: Secondary | ICD-10-CM | POA: Insufficient documentation

## 2019-08-04 DIAGNOSIS — Y92129 Unspecified place in nursing home as the place of occurrence of the external cause: Secondary | ICD-10-CM | POA: Diagnosis not present

## 2019-08-04 DIAGNOSIS — R1110 Vomiting, unspecified: Secondary | ICD-10-CM | POA: Diagnosis present

## 2019-08-04 DIAGNOSIS — W01198A Fall on same level from slipping, tripping and stumbling with subsequent striking against other object, initial encounter: Secondary | ICD-10-CM | POA: Diagnosis not present

## 2019-08-04 DIAGNOSIS — R109 Unspecified abdominal pain: Secondary | ICD-10-CM | POA: Diagnosis not present

## 2019-08-04 DIAGNOSIS — G44309 Post-traumatic headache, unspecified, not intractable: Secondary | ICD-10-CM | POA: Insufficient documentation

## 2019-08-04 LAB — CBC WITH DIFFERENTIAL/PLATELET
Abs Immature Granulocytes: 0.02 10*3/uL (ref 0.00–0.07)
Basophils Absolute: 0 10*3/uL (ref 0.0–0.1)
Basophils Relative: 0 %
Eosinophils Absolute: 0.1 10*3/uL (ref 0.0–0.5)
Eosinophils Relative: 1 %
HCT: 40.5 % (ref 36.0–46.0)
Hemoglobin: 13.2 g/dL (ref 12.0–15.0)
Immature Granulocytes: 0 %
Lymphocytes Relative: 17 %
Lymphs Abs: 1.3 10*3/uL (ref 0.7–4.0)
MCH: 29.5 pg (ref 26.0–34.0)
MCHC: 32.6 g/dL (ref 30.0–36.0)
MCV: 90.6 fL (ref 80.0–100.0)
Monocytes Absolute: 0.8 10*3/uL (ref 0.1–1.0)
Monocytes Relative: 10 %
Neutro Abs: 5.4 10*3/uL (ref 1.7–7.7)
Neutrophils Relative %: 72 %
Platelets: 191 10*3/uL (ref 150–400)
RBC: 4.47 MIL/uL (ref 3.87–5.11)
RDW: 13.9 % (ref 11.5–15.5)
WBC: 7.6 10*3/uL (ref 4.0–10.5)
nRBC: 0 % (ref 0.0–0.2)

## 2019-08-04 LAB — COMPREHENSIVE METABOLIC PANEL
ALT: 23 U/L (ref 0–44)
AST: 36 U/L (ref 15–41)
Albumin: 3.7 g/dL (ref 3.5–5.0)
Alkaline Phosphatase: 38 U/L (ref 38–126)
Anion gap: 11 (ref 5–15)
BUN: 27 mg/dL — ABNORMAL HIGH (ref 8–23)
CO2: 19 mmol/L — ABNORMAL LOW (ref 22–32)
Calcium: 9.7 mg/dL (ref 8.9–10.3)
Chloride: 103 mmol/L (ref 98–111)
Creatinine, Ser: 1.06 mg/dL — ABNORMAL HIGH (ref 0.44–1.00)
GFR calc Af Amer: 55 mL/min — ABNORMAL LOW (ref >60)
GFR calc non Af Amer: 47 mL/min — ABNORMAL LOW (ref >60)
Glucose, Bld: 136 mg/dL — ABNORMAL HIGH (ref 70–99)
Potassium: 4.3 mmol/L (ref 3.5–5.1)
Sodium: 133 mmol/L — ABNORMAL LOW (ref 135–145)
Total Bilirubin: 0.9 mg/dL (ref 0.3–1.2)
Total Protein: 7.7 g/dL (ref 6.5–8.1)

## 2019-08-04 LAB — URINALYSIS, ROUTINE W REFLEX MICROSCOPIC
Bilirubin Urine: NEGATIVE
Glucose, UA: NEGATIVE mg/dL
Hgb urine dipstick: NEGATIVE
Ketones, ur: NEGATIVE mg/dL
Leukocytes,Ua: NEGATIVE
Nitrite: NEGATIVE
Protein, ur: NEGATIVE mg/dL
Specific Gravity, Urine: 1.009 (ref 1.005–1.030)
pH: 7 (ref 5.0–8.0)

## 2019-08-04 MED ORDER — ACETAMINOPHEN 325 MG PO TABS
650.0000 mg | ORAL_TABLET | Freq: Once | ORAL | Status: AC
  Administered 2019-08-04: 650 mg via ORAL
  Filled 2019-08-04: qty 2

## 2019-08-04 NOTE — ED Triage Notes (Signed)
Patient arrives via ems from [facility] due to having a mechanical fall yesterday. Per ems, the patient fell and hit her head yesterday. She does not take blood thinners.   Patient woke up this morning with R. Flank pain and difficulty ambulating. She also had 1 episode vomiting while at home. Rates her pain a 7/10.

## 2019-08-04 NOTE — ED Notes (Signed)
Got patient a pillow patient is resting with family at bedside and call bell in reach

## 2019-08-04 NOTE — NC FL2 (Signed)
MEDICAID FL2 LEVEL OF CARE SCREENING TOOL     IDENTIFICATION  Patient Name: [name] Birthdate: 1929/09/02 Sex: female Admission Date (Current Location): 08/04/2019  [facility] [place] Number:  Herbalist and Address:  [facility] [place] [address] [place], [place] [address]      Provider Number: [id number]  Attending Physician Name and Address:  Carmin Muskrat, MD  Relative Name and Phone Number:  Kilee, Hedding Daughter [phone]    Current Level of Care: Hospital Recommended Level of Care: [place] Prior Approval Number:    Date Approved/Denied:   PASRR Number: Pending  Discharge Plan: Home    Current Diagnoses: Patient Active Problem List   Diagnosis Date Noted  . Atherosclerosis of abdominal aorta ([place]) 10/02/2015  . Obesity (BMI 30-39.9) 10/31/2014  . Chronic kidney disease (CKD), stage III (moderate) 10/31/2014  . Ventral incisional hernia 10/31/2014  . Primary peritoneal carcinomatosis ([place]) 10/31/2014  . Incisional hernia, periumbilical, without obstruction or gangrene 11/03/2012  . Anemia associated with acute blood loss 04/03/2011  . Ovarian cancer ([place]) 01/25/2011  . HYPERLIPIDEMIA 04/23/2009  . Unspecified essential hypertension 04/23/2009  . GERD 04/23/2009  . OSTEOARTHRITIS 04/23/2009  . OSTEOPENIA 04/23/2009    Orientation RESPIRATION BLADDER Height & Weight     Self, Time, Situation, Place  Normal Continent Weight:   Height:     BEHAVIORAL SYMPTOMS/MOOD NEUROLOGICAL BOWEL NUTRITION STATUS        Diet (regular)  AMBULATORY STATUS COMMUNICATION OF NEEDS Skin   Limited Assist Verbally Normal                       Personal Care Assistance Level of Assistance  Bathing, Feeding, Dressing Bathing Assistance: Limited assistance Feeding assistance: Independent Dressing Assistance: Limited assistance     Functional Limitations Info  Sight, Hearing, Speech Sight Info:  Adequate Hearing Info: Adequate Speech Info: Adequate    SPECIAL CARE FACTORS FREQUENCY  PT (By licensed PT), OT (By licensed OT)     PT Frequency: 5 x weekly OT Frequency: 5x weekly            Contractures Contractures Info: Present    Additional Factors Info  Code Status, Allergies Code Status Info: Full Allergies Info: Penicillins,Shellfish,Morphine And Related           Current Medications (08/04/2019):  This is the current hospital active medication list No current facility-administered medications for this encounter.   Current Outpatient Medications  Medication Sig Dispense Refill  . amLODipine (NORVASC) 5 MG tablet Take 5 mg by mouth daily.    Marland Kitchen azelastine (ASTELIN) 0.1 % nasal spray Place 1 spray into both nostrils 2 (two) times daily as needed.     . B Complex Vitamins (VITAMIN B COMPLEX PO) Take 1 tablet by mouth daily.     . calcium carbonate (TUMS EX) 750 MG chewable tablet Chew 1 tablet by mouth daily.    . Cholecalciferol (VITAMIN D) 2000 UNITS tablet Take 2,000 Units by mouth daily.     Marland Kitchen denosumab (PROLIA) 60 MG/ML SOSY injection Inject 60 mg into the skin every 6 (six) months.    . famotidine (PEPCID) 10 MG tablet Take 10 mg by mouth daily as needed for heartburn or indigestion.    . fenofibrate (TRICOR) 145 MG tablet Take 145 mg by mouth daily. with food    . lisinopril (ZESTRIL) 20 MG tablet Take 20 mg by mouth daily.    Marland Kitchen  Multiple Vitamins-Minerals (CENTRUM SILVER 50+MEN PO) Take 1 tablet by mouth daily    . nadolol (CORGARD) 20 MG tablet Take 20 mg by mouth daily.     Marland Kitchen omega-3 acid ethyl esters (LOVAZA) 1 g capsule Take 1 g by mouth daily.     Marland Kitchen omeprazole (PRILOSEC) 20 MG capsule Take 20 mg by mouth 2 (two) times daily before a meal.     . Polyethyl Glycol-Propyl Glycol (SYSTANE) 0.4-0.3 % SOLN Apply 1 drop to eye daily as needed.     . Red Yeast Rice 600 MG CAPS Take 600 mg by mouth daily.     . vitamin E 400 UNIT capsule Take 400 Units by mouth  daily.     Marland Kitchen zolpidem (AMBIEN) 10 MG tablet Take 5-10 mg by mouth daily as needed for sleep.        Discharge Medications: Please see discharge summary for a list of discharge medications.  Relevant Imaging Results:  Relevant Lab Results:   Additional Information SSN [id number]  Vergie Living, LCSW

## 2019-08-04 NOTE — ED Provider Notes (Signed)
[facility] EMERGENCY DEPARTMENT Provider Note   CSN: [id number] Arrival date & time: 08/04/19  1135     History Chief Complaint  Patient presents with  . Fall  . Flank Pain    [name] is a 86 y.o. female.  HPI    Patient presents after a fall that occurred yesterday with concern for both the fall and an episode of vomiting that occurred today just prior to ED arrival. Patient lives in a nursing facility.  She notes that she had a mechanical fall, struck her head yesterday.  There is no loss of consciousness, but she did have subsequent development of soreness in the right parietal region. No neck pain, no new weakness in any extremity.  She also has some pain in her right flank.  She has been upright, ambulatory since the fall, but notes that the pain in her flank is worse with ambulation, motion, deep inspiration or coughing. Today, after drinking orange juice the patient also had one episode of vomiting.  This prompted transfer to the ED for evaluation.  Past Medical History:  Diagnosis Date  . Anemia    hx of   . Anemia associated with acute blood loss 04/03/2011  . Arthritis    knees   . ARTHRITIS, SEPTIC 05/28/2009   Qualifier: Diagnosis of  By: Tommy Medal MD, Roderic Scarce    . Ascites   . Blood transfusion    hx of 2011   . Complication of anesthesia    Sodium drops per pt   . Cystitis   . DIARRHEA, ANTIBIOTIC ASSOCIATED 05/31/2009   Qualifier: Diagnosis of  By: Tommy Medal MD, Roderic Scarce    . GERD (gastroesophageal reflux disease)   . H/O hiatal hernia   . Hyperlipidemia   . Hypertension   . Hyponatremia   . Neutropenia with fever ([place]) 05/18/2011  . OSTEOMYELITIS, CHRONIC, LOWER LEG 04/23/2009   Qualifier: Diagnosis of  By: Johnnye Sima MD, Dellis Filbert    . Osteopenia   . OSTEOPOROSIS 04/23/2009   Qualifier: Diagnosis of  By: Johnnye Sima MD, Dellis Filbert    . Ovarian cancer ([place]) 01/25/2011  . Pneumonia    hx of   . Postmenopausal atrophic vaginitis   .  PROSTHETIC JOINT COMPLICATION 2/56/3893   Qualifier: Diagnosis of  By: Tommy Medal MD, Roderic Scarce    . Renal disorder    Decreased kidney function  . Staph infection 2010   after knee replacement  . Urinary frequency   . Vulvitis     Patient Active Problem List   Diagnosis Date Noted  . Atherosclerosis of abdominal aorta ([place]) 10/02/2015  . Obesity (BMI 30-39.9) 10/31/2014  . Chronic kidney disease (CKD), stage III (moderate) 10/31/2014  . Ventral incisional hernia 10/31/2014  . Primary peritoneal carcinomatosis ([place]) 10/31/2014  . Incisional hernia, periumbilical, without obstruction or gangrene 11/03/2012  . Anemia associated with acute blood loss 04/03/2011  . Ovarian cancer ([place]) 01/25/2011  . HYPERLIPIDEMIA 04/23/2009  . Unspecified essential hypertension 04/23/2009  . GERD 04/23/2009  . OSTEOARTHRITIS 04/23/2009  . OSTEOPENIA 04/23/2009    Past Surgical History:  Procedure Laterality Date  . ABDOMINAL HYSTERECTOMY  04/01/2011   Procedure: HYSTERECTOMY ABDOMINAL;  Surgeon: Alvino Chapel, MD;  Location: [facility];  Service: Gynecology;  Laterality: N/A;  . APPENDECTOMY    . JOINT REPLACEMENT     R knee in 2008, 5 operations on L knee  . LAPAROTOMY  04/01/2011   Procedure: EXPLORATORY LAPAROTOMY;  Surgeon: Alvino Chapel, MD;  Location: [facility];  Service: Gynecology;  Laterality: N/A;  . OTHER SURGICAL HISTORY     hx of C section 1966  . SALPINGOOPHORECTOMY  04/01/2011   Procedure: SALPINGO OOPHERECTOMY;  Surgeon: Alvino Chapel, MD;  Location: [facility];  Service: Gynecology;  Laterality: Bilateral;  . TONSILLECTOMY       OB History    Gravida      Para      Term      Preterm      AB      Living  3     SAB      TAB      Ectopic      Multiple      Live Births              Family History  Problem Relation Age of Onset  . Cancer Other        Bladder cancer  . Heart attack Brother   . Diabetes Brother     Social History    Tobacco Use  . Smoking status: Former Smoker    Quit date: 02/11/1952    Years since quitting: 67.5  . Smokeless tobacco: Never Used  Vaping Use  . Vaping Use: Never used  Substance Use Topics  . Alcohol use: Not Currently  . Drug use: Never    Home Medications Prior to Admission medications   Medication Sig Start Date End Date Taking? Authorizing Provider  amLODipine (NORVASC) 5 MG tablet Take 5 mg by mouth daily. 12/29/18   [provider]  azelastine (ASTELIN) 0.1 % nasal spray Place 1 spray into both nostrils 2 (two) times daily as needed.  12/06/15   [provider]  B Complex Vitamins (VITAMIN B COMPLEX PO) Take 1 tablet by mouth daily.     [provider]  calcium carbonate (TUMS EX) 750 MG chewable tablet Chew 1 tablet by mouth daily.    [provider]  Cholecalciferol (VITAMIN D) 2000 UNITS tablet Take 2,000 Units by mouth daily.     [provider]  denosumab (PROLIA) 60 MG/ML SOSY injection Inject 60 mg into the skin every 6 (six) months.    [provider]  famotidine (PEPCID) 10 MG tablet Take 10 mg by mouth daily as needed for heartburn or indigestion.    [provider]  fenofibrate (TRICOR) 145 MG tablet Take 145 mg by mouth daily. with food 07/24/18   [provider]  lisinopril (ZESTRIL) 20 MG tablet Take 20 mg by mouth daily. 10/18/18   [provider]  Multiple Vitamins-Minerals (CENTRUM SILVER 50+MEN PO) Take 1 tablet by mouth daily    [provider]  nadolol (CORGARD) 20 MG tablet Take 20 mg by mouth daily.     [provider]  omega-3 acid ethyl esters (LOVAZA) 1 g capsule Take 1 g by mouth daily.     [provider]  omeprazole (PRILOSEC) 20 MG capsule Take 20 mg by mouth 2 (two) times daily before a meal.  11/16/11   [provider]  Polyethyl Glycol-Propyl Glycol (SYSTANE) 0.4-0.3 % SOLN Apply 1 drop to eye daily as needed.     [provider]  Red Yeast Rice 600 MG CAPS Take 600 mg by mouth daily.  09/03/12   [provider]  vitamin E 400 UNIT capsule Take 400 Units by mouth daily.     [provider]  zolpidem (AMBIEN) 10 MG tablet Take 5-10 mg by mouth  daily as needed for sleep.     [provider]    Allergies    Morphine and related, Penicillins, and Shellfish allergy  Review of Systems   Review of Systems  Constitutional:       Per HPI, otherwise negative  HENT:       Per HPI, otherwise negative  Respiratory:       Per HPI, otherwise negative  Cardiovascular:       Per HPI, otherwise negative  Gastrointestinal: Positive for nausea and vomiting.  Endocrine:       Negative aside from HPI  Genitourinary:       Neg aside from HPI   Musculoskeletal:       Per HPI, otherwise negative  Skin: Negative.   Neurological: Negative for syncope.    Physical Exam Updated Vital Signs BP (!) 170/79 (BP Location: Left Arm)   Pulse 85   Temp 98.2 F (36.8 C) (Oral)   Resp 18   SpO2 99%   Physical Exam Vitals and nursing note reviewed.  Constitutional:      General: She is not in acute distress.    Appearance: She is well-developed.  HENT:     Head: Normocephalic and atraumatic.  Eyes:     Conjunctiva/sclera: Conjunctivae normal.  Cardiovascular:     Rate and Rhythm: Normal rate and regular rhythm.  Pulmonary:     Effort: Pulmonary effort is normal. No respiratory distress.     Breath sounds: Normal breath sounds. No stridor.  Abdominal:     General: There is no distension.     Tenderness: There is no abdominal tenderness. There is no guarding.  Musculoskeletal:        General: No deformity.  Skin:    General: Skin is warm and dry.  Neurological:     Mental Status: She is alert and oriented to person, place, and time.     Cranial Nerves: No cranial nerve deficit.     ED Results / Procedures / Treatments   Labs (all labs ordered are listed, but only abnormal  results are displayed) Labs Reviewed  COMPREHENSIVE METABOLIC PANEL - Abnormal; Notable for the following components:      Result Value   Sodium 133 (*)    CO2 19 (*)    Glucose, Bld 136 (*)    BUN 27 (*)    Creatinine, Ser 1.06 (*)    GFR calc non Af Amer 47 (*)    GFR calc Af Amer 55 (*)    All other components within normal limits  URINALYSIS, ROUTINE W REFLEX MICROSCOPIC - Abnormal; Notable for the following components:   Color, Urine STRAW (*)    All other components within normal limits  CBC WITH DIFFERENTIAL/PLATELET    EKG None  Radiology CT Head Wo Contrast  Result Date: 08/04/2019 CLINICAL DATA:  Posttraumatic EXAM: CT HEAD WITHOUT CONTRAST TECHNIQUE: Contiguous axial images were obtained from the base of the skull through the vertex without intravenous contrast. COMPARISON:  September 09, 2017 FINDINGS: Brain: No evidence of acute territorial infarction, hemorrhage, hydrocephalus,extra-axial collection or mass lesion/mass effect. There is dilatation the ventricles and sulci consistent with age-related atrophy. Low-attenuation changes in the deep white matter consistent with small vessel ischemia. Vascular: No hyperdense vessel or unexpected calcification. Skull: The skull is intact. No fracture or focal lesion identified. Sinuses/Orbits: There is complete opacification of left sphenoid sinus. The orbits and globes intact. Other: None IMPRESSION: No acute intracranial abnormality. Findings consistent with age related  atrophy and chronic small vessel ischemia Chronic left seen sphenoid sinusitis Electronically Signed   By: Prudencio Pair M.D.   On: 08/04/2019 13:21   DG Pelvis Portable  Result Date: 08/04/2019 CLINICAL DATA:  Fall right hip pain EXAM: PORTABLE PELVIS 1-2 VIEWS COMPARISON:  None. FINDINGS: There is no evidence of pelvic fracture or diastasis. No pelvic bone lesions are seen. Scattered vascular calcifications are noted. IMPRESSION: No acute osseous abnormality.  Electronically Signed   By: Prudencio Pair M.D.   On: 08/04/2019 12:23   DG Chest Port 1 View  Result Date: 08/04/2019 CLINICAL DATA:  Status post fall.  Shortness of breath. EXAM: PORTABLE CHEST 1 VIEW COMPARISON:  PA and lateral chest 01/25/2014 FINDINGS: Lungs clear. Heart size normal. Atherosclerosis. No pneumothorax or pleural fluid. No acute or focal bony abnormality IMPRESSION: No acute disease. Aortic Atherosclerosis (ICD10-I70.0). Electronically Signed   By: Inge Rise M.D.   On: 08/04/2019 12:50    Procedures Procedures (including critical care time)  Medications Ordered in ED Medications - No data to display  ED Course  I have reviewed the triage vital signs and the nursing notes.  Pertinent labs & imaging results that were available during my care of the patient were reviewed by me and considered in my medical decision making (see chart for details).     Elderly female presents 1 day after fall with flank pain, nausea, vomiting. The patient is awake, alert, on my initial evaluation, given her age, colitides, fall, consideration of intracranial injury, fracture versus rib fracture, less likely retroperitoneal bleed all considered. Labs, x-ray, CT all ordered.  Update: Patient accompanied by her daughter.   2:48 PM Patient awake, alert, smiling.  We discussed all findings again, x-ray with no fracture, CT head, with no intracranial abnormalities, no fracture, labs, including urinalysis, without substantial abnormalities.  Given reassuring hemoglobin values, absence of hematuria, low suspicion for retroperitoneal bleed, renal contusion.  No evidence for hemopneumothorax, no evidence for neurologic dysfunction, and given the passage of almost 1 day since the fall, lower suspicion for occult injury. Patient may be suffering pain from musculoskeletal etiology, though we discussed importance of following up should her pain become worse instead of better. Patient lives in an  independent living facility, and I discussed her case with our case management colleagues to ensure appropriate resources for rehabilitation. Given the generally reassuring findings, the patient was discharged in stable condition.  MDM Number of Diagnoses or Management Options Fall, initial encounter: new, needed workup   Amount and/or Complexity of Data Reviewed Clinical lab tests: reviewed Tests in the radiology section of CPT: reviewed Tests in the medicine section of CPT: reviewed Decide to obtain previous medical records or to obtain history from someone other than the patient: yes Obtain history from someone other than the patient: yes Review and summarize past medical records: yes Discuss the patient with other providers: yes Independent visualization of images, tracings, or specimens: yes  Risk of Complications, Morbidity, and/or Mortality Presenting problems: high Diagnostic procedures: high Management options: high  Critical Care Total time providing critical care: < 30 minutes  Patient Progress Patient progress: improved  Final Clinical Impression(s) / ED Diagnoses Final diagnoses:  Fall, initial encounter      Carmin Muskrat, MD 08/04/19 1451

## 2019-08-04 NOTE — Evaluation (Signed)
Physical Therapy Evaluation Patient Details Name: [name] MRN: [id number] DOB: Jun 18, 1929 Today's Date: 08/04/2019   History of Present Illness  Pt is an 86 y/o female presenting after fall at [facility]. Imaging negative for acute abnormality, however, pt does complain of R sided mid back pain. PMH includes HTN, osteoporosis, and HTN.   Clinical Impression  Pt presenting with problem above and deficits below. Pt requiring mod A to stand at edge of stretcher this session. Reports increased back pain on R side of back in paraspinal muscle region. Pt currently lives in [facility] and was mod I with use of RW. Feel pt would benefit from short term SNF prior to return to ILF to increase independence and safety. Will continue to follow acutely.     Follow Up Recommendations SNF    Equipment Recommendations  None recommended by PT    Recommendations for Other Services       Precautions / Restrictions Precautions Precautions: Fall Precaution Comments: Pt had fall prior to presentation Restrictions Weight Bearing Restrictions: No      Mobility  Bed Mobility Overal bed mobility: Needs Assistance Bed Mobility: Sit to Supine       Sit to supine: Min assist   General bed mobility comments: Min A for LE assist for return to supine.   Transfers Overall transfer level: Needs assistance Equipment used: 1 person hand held assist Transfers: Sit to/from Stand Sit to Stand: Mod assist         General transfer comment: Mod A for lift assist and steadying to stand. Increased time required secondary to pain. Pt refusing further mobility as she reports she hasn't eaten and was  not feeling good.   Ambulation/Gait                Stairs            Wheelchair Mobility    Modified Rankin (Stroke Patients Only)       Balance Overall balance assessment: Needs assistance Sitting-balance support: No upper extremity supported;Feet supported Sitting balance-Leahy Scale: Fair      Standing balance support: Single extremity supported;During functional activity Standing balance-Leahy Scale: Poor Standing balance comment: Reliant on UE and external support                              Pertinent Vitals/Pain Pain Assessment: Faces Faces Pain Scale: Hurts even more Pain Location: R side of back  Pain Descriptors / Indicators: Aching;Grimacing;Guarding Pain Intervention(s): Limited activity within patient's tolerance;Monitored during session;Repositioned    Home Living Family/patient expects to be discharged to:: Skilled nursing facility                 Additional Comments: Pt from ILF and was previously very independent.     Prior Function Level of Independence: Independent with assistive device(s)         Comments: Used RW for ambulation outside of home. Reports she would ambulate without AD inside of home. Was still driving.      Hand Dominance        Extremity/Trunk Assessment   Upper Extremity Assessment Upper Extremity Assessment: Generalized weakness    Lower Extremity Assessment Lower Extremity Assessment: Generalized weakness    Cervical / Trunk Assessment Cervical / Trunk Assessment: Other exceptions Cervical / Trunk Exceptions: R sided back pain   Communication   Communication: No difficulties  Cognition Arousal/Alertness: Awake/alert Behavior During Therapy: WFL for tasks assessed/performed Overall Cognitive  Status: Within Functional Limits for tasks assessed                                        General Comments General comments (skin integrity, edema, etc.): Pt's daughter present during session.     Exercises     Assessment/Plan    PT Assessment Patient needs continued PT services  PT Problem List Decreased strength;Decreased balance;Decreased mobility;Decreased knowledge of use of DME;Decreased knowledge of precautions;Pain       PT Treatment Interventions Gait training;DME  instruction;Therapeutic activities;Therapeutic exercise;Functional mobility training;Balance training;Patient/family education    PT Goals (Current goals can be found in the Care Plan section)  Acute Rehab PT Goals Patient Stated Goal: to get stronger before going back to ILF PT Goal Formulation: With patient/family Time For Goal Achievement: 08/18/19 Potential to Achieve Goals: Good    Frequency Min 2X/week   Barriers to discharge Decreased caregiver support      Co-evaluation               AM-PAC PT "6 Clicks" Mobility  Outcome Measure Help needed turning from your back to your side while in a flat bed without using bedrails?: A Little Help needed moving from lying on your back to sitting on the side of a flat bed without using bedrails?: A Little Help needed moving to and from a bed to a chair (including a wheelchair)?: A Lot Help needed standing up from a chair using your arms (e.g., wheelchair or bedside chair)?: A Lot Help needed to walk in hospital room?: A Lot Help needed climbing 3-5 steps with a railing? : Total 6 Click Score: 13    End of Session Equipment Utilized During Treatment: Gait belt Activity Tolerance: Patient limited by pain Patient left: in bed;with call bell/phone within reach;with family/visitor present (in ED) Nurse Communication: Mobility status PT Visit Diagnosis: Muscle weakness (generalized) (M62.81);Unsteadiness on feet (R26.81);Difficulty in walking, not elsewhere classified (R26.2);Pain Pain - part of body:  (back)    Time: 6770-3403 PT Time Calculation (min) (ACUTE ONLY): 15 min   Charges:   PT Evaluation $PT Eval Moderate Complexity: 1 Mod          Reuel Derby, PT, DPT  Acute Rehabilitation Services  Pager: [phone] Office: [phone]   Rudean Hitt 08/04/2019, 4:07 PM

## 2019-08-04 NOTE — Care Management (Signed)
ED CM received consult concerning patient possibly needing a increase level of care. Patient is from [facility] independent living s/p fall. CM met with patient and daughter at bedside to discuss transitional care planning. ED evaluation noted no significant findings. Patient is having difficulty ambulating as per family PT has been ordered to evaluate. CM has contacted Mearl Latin [phone]  and Ebony Hail at [facility] to assist with securing a temporary higher level of care awaiting return call.

## 2019-08-04 NOTE — ED Notes (Signed)
Pt and pt's daughter verbalized understanding of discharge instructions. Pain management and follow up care reviewed, pt had no further questions.

## 2019-08-04 NOTE — Discharge Instructions (Addendum)
As discussed, your evaluation today has been largely reassuring.  But, it is important that you monitor your condition carefully, and do not hesitate to return to the ED if you develop new, or concerning changes in your condition.   For Tylenol, 650 mg, up to 3 times daily.  In addition,medicated cream containing the ingredient Voltaren is an appropriate supplemental pain medication.  Otherwise,  please follow-up with your physician for appropriate ongoing care.

## 2019-08-04 NOTE — ED Notes (Signed)
PT at bedside.

## 2019-08-05 ENCOUNTER — Encounter: Payer: Self-pay | Admitting: Nurse Practitioner

## 2019-08-05 ENCOUNTER — Non-Acute Institutional Stay (SKILLED_NURSING_FACILITY): Payer: Medicare Other | Admitting: Nurse Practitioner

## 2019-08-05 DIAGNOSIS — N1831 Chronic kidney disease, stage 3a: Secondary | ICD-10-CM | POA: Diagnosis not present

## 2019-08-05 DIAGNOSIS — Z9181 History of falling: Secondary | ICD-10-CM | POA: Diagnosis not present

## 2019-08-05 DIAGNOSIS — K219 Gastro-esophageal reflux disease without esophagitis: Secondary | ICD-10-CM

## 2019-08-05 DIAGNOSIS — E871 Hypo-osmolality and hyponatremia: Secondary | ICD-10-CM | POA: Diagnosis not present

## 2019-08-05 DIAGNOSIS — M8949 Other hypertrophic osteoarthropathy, multiple sites: Secondary | ICD-10-CM

## 2019-08-05 DIAGNOSIS — M159 Polyosteoarthritis, unspecified: Secondary | ICD-10-CM

## 2019-08-05 DIAGNOSIS — M949 Disorder of cartilage, unspecified: Secondary | ICD-10-CM

## 2019-08-05 DIAGNOSIS — R2681 Unsteadiness on feet: Secondary | ICD-10-CM | POA: Diagnosis not present

## 2019-08-05 DIAGNOSIS — K5901 Slow transit constipation: Secondary | ICD-10-CM | POA: Insufficient documentation

## 2019-08-05 DIAGNOSIS — I10 Essential (primary) hypertension: Secondary | ICD-10-CM

## 2019-08-05 DIAGNOSIS — M899 Disorder of bone, unspecified: Secondary | ICD-10-CM

## 2019-08-05 DIAGNOSIS — M62838 Other muscle spasm: Secondary | ICD-10-CM

## 2019-08-05 DIAGNOSIS — G4700 Insomnia, unspecified: Secondary | ICD-10-CM | POA: Insufficient documentation

## 2019-08-05 DIAGNOSIS — M6281 Muscle weakness (generalized): Secondary | ICD-10-CM | POA: Diagnosis not present

## 2019-08-05 DIAGNOSIS — R41841 Cognitive communication deficit: Secondary | ICD-10-CM | POA: Diagnosis not present

## 2019-08-05 DIAGNOSIS — F5101 Primary insomnia: Secondary | ICD-10-CM

## 2019-08-05 NOTE — Assessment & Plan Note (Signed)
Reported no negative side effects from long term using Zolpidem, continue Zolpidem 5mg  hs prn

## 2019-08-05 NOTE — Assessment & Plan Note (Signed)
Blood pressure is controlled, continue Amlodipine 5mg  qd, Lisinopril 20mg  qd, Nadolol 20mg  qd.

## 2019-08-05 NOTE — Assessment & Plan Note (Signed)
Takes Prolia, Ca, Vit D

## 2019-08-05 NOTE — Progress Notes (Signed)
Location:    [place] [facility] Room Number: 10 Place of Service:  SNF (31) Provider: Marlana Latus NP  Hulan Fess, MD  Patient Care Team: Hulan Fess, MD as PCP - General (Family Medicine) Brien Few, MD as Consulting Physician (Obstetrics and Gynecology) Marti Sleigh, MD as Consulting Physician (Gynecology) Nancy Marus, MD as Consulting Physician (Gynecologic Oncology)  Extended Emergency Contact Information Primary Emergency Contact: [facility] Address: [address]          [place], [place] [address] [name] of [place] Phone: [phone] Mobile Phone: [phone] Relation: Daughter  Code Status:  DNR Goals of care: Advanced Directive information Advanced Directives 09/09/2017  Does Patient Have a Medical Advance Directive? Yes  Type of Advance Directive Living will;Healthcare Power of [place] in Chart? No - copy requested  Pre-existing out of facility DNR order (yellow form or pink MOST form) -     Chief Complaint  Patient presents with  . Acute Visit    Medication review    HPI:  Pt is a 86 y.o. female seen today for an acute visit for c/o muscle spasm right mid to lower back parallels to the spine. Small bruise and trigger point of tenderness noted, prn Tylenol  ED eval for fall 08/04/19, vomited x1 after orange juice,  c/o right parietal and right flank pain with deep breathing, walking, movement. X-ray chest, pelvis, CT head, UA unremarkable, except noted Na 133 eGFR 47  Constipation, no BM for several days, said Colace works for her  HTN, takes Amlodipine 48m qd, Lisinopril 286mqd, Nadolol 2037md  OP, takes Ca, Vit D, Prolia  GERD, takes Famotidine 28m72mn, Omeprazole 20mg15m.   Insomnia, takes Zolpidem 5mg q36m     Past Medical History:  Diagnosis Date  . Anemia    hx of   . Anemia associated with acute blood loss 04/03/2011  . Arthritis    knees   .  ARTHRITIS, SEPTIC 05/28/2009   Qualifier: Diagnosis of  By: Van DaTommy MedalornelRoderic ScarceAscites   . Blood transfusion    hx of 2011   . Complication of anesthesia    Sodium drops per pt   . Cystitis   . DIARRHEA, ANTIBIOTIC ASSOCIATED 05/31/2009   Qualifier: Diagnosis of  By: Van DaTommy MedalornelRoderic ScarceGERD (gastroesophageal reflux disease)   . H/O hiatal hernia   . Hyperlipidemia   . Hypertension   . Hyponatremia   . Neutropenia with fever (HCC) 4[place]2013  . OSTEOMYELITIS, CHRONIC, LOWER LEG 04/23/2009   Qualifier: Diagnosis of  By: HatcheJohnnye SimaeffreDellis FilbertOsteopenia   . OSTEOPOROSIS 04/23/2009   Qualifier: Diagnosis of  By: HatcheJohnnye SimaeffreDellis FilbertOvarian cancer (HCC) 1[place]/2012  . Pneumonia    hx of   . Postmenopausal atrophic vaginitis   . PROSTHETIC JOINT COMPLICATION 05/29/19/19/4174lifier: Diagnosis of  By: Van DaTommy MedalornelRoderic ScarceRenal disorder    Decreased kidney function  . Staph infection 2010   after knee replacement  . Urinary frequency   . Vulvitis    Past Surgical History:  Procedure Laterality Date  . ABDOMINAL HYSTERECTOMY  04/01/2011   Procedure: HYSTERECTOMY ABDOMINAL;  Surgeon: DanielAlvino Chapel Location: [facility];  Service: Gynecology;  Laterality: N/A;  . APPENDECTOMY    . JOINT REPLACEMENT  R knee in 2008, 5 operations on L knee  . LAPAROTOMY  04/01/2011   Procedure: EXPLORATORY LAPAROTOMY;  Surgeon: Alvino Chapel, MD;  Location: [facility];  Service: Gynecology;  Laterality: N/A;  . OTHER SURGICAL HISTORY     hx of C section 1966  . SALPINGOOPHORECTOMY  04/01/2011   Procedure: SALPINGO OOPHERECTOMY;  Surgeon: Alvino Chapel, MD;  Location: [facility];  Service: Gynecology;  Laterality: Bilateral;  . TONSILLECTOMY      Allergies  Allergen Reactions  . Morphine And Related     Given after knee replacement and code blue occurred  . Penicillins     REACTION: hives  . Shellfish Allergy     Pt has shellfish allergy only.   Has had IV contrast x 2 and did fine.    Allergies as of 08/05/2019      Reactions   Morphine And Related    Given after knee replacement and code blue occurred   Penicillins    REACTION: hives   Shellfish Allergy    Pt has shellfish allergy only.  Has had IV contrast x 2 and did fine.      Medication List       Accurate as of August 05, 2019  3:32 PM. If you have any questions, ask your nurse or doctor.        acetaminophen 325 MG tablet Commonly known as: TYLENOL Take 650 mg by mouth every 4 (four) hours as needed.   amLODipine 5 MG tablet Commonly known as: NORVASC Take 5 mg by mouth daily.   azelastine 0.1 % nasal spray Commonly known as: ASTELIN Place 1 spray into both nostrils 2 (two) times daily as needed.   calcium carbonate 750 MG chewable tablet Commonly known as: TUMS EX Chew 1 tablet by mouth daily.   CENTRUM SILVER 50+MEN PO Take 1 tablet by mouth daily   denosumab 60 MG/ML Sosy injection Commonly known as: PROLIA Inject 60 mg into the skin every 6 (six) months.   famotidine 10 MG tablet Commonly known as: PEPCID Take 10 mg by mouth daily as needed for heartburn or indigestion.   fenofibrate 145 MG tablet Commonly known as: TRICOR Take 145 mg by mouth daily. with food   lisinopril 20 MG tablet Commonly known as: ZESTRIL Take 20 mg by mouth daily.   Lovaza 1 g capsule Generic drug: omega-3 acid ethyl esters Take 1 g by mouth daily.   nadolol 20 MG tablet Commonly known as: CORGARD Take 20 mg by mouth daily.   omeprazole 20 MG capsule Commonly known as: PRILOSEC Take 20 mg by mouth 2 (two) times daily before a meal.   Red Yeast Rice 600 MG Caps Take 600 mg by mouth daily.   Systane 0.4-0.3 % Soln Generic drug: Polyethyl Glycol-Propyl Glycol Apply 1 drop to eye daily as needed.   VITAMIN B COMPLEX PO Take 1 tablet by mouth daily.   Vitamin D 50 MCG (2000 UT) tablet Take 2,000 Units by mouth daily.   vitamin E 180 MG (400 UNITS)  capsule Take 400 Units by mouth daily.   zolpidem 10 MG tablet Commonly known as: AMBIEN Take 5-10 mg by mouth daily as needed for sleep.       Review of Systems  Constitutional: Negative for activity change, appetite change and fever.  HENT: Negative for congestion, trouble swallowing and voice change.   Eyes: Negative for visual disturbance.  Respiratory: Negative for cough, shortness of breath and wheezing.  Cardiovascular: Negative for chest pain, palpitations and leg swelling.  Gastrointestinal: Positive for constipation. Negative for abdominal pain, nausea and vomiting.  Genitourinary: Negative for difficulty urinating, dysuria, frequency and urgency.  Musculoskeletal: Positive for arthralgias, back pain and gait problem.       Right lower back discomfort  Skin: Positive for color change.  Neurological: Negative for dizziness, tremors, facial asymmetry, speech difficulty, weakness and headaches.       Muscle spasm episodes right mid to lower back parallels to the spine.   Hematological: Bruises/bleeds easily.       Right lower back  Psychiatric/Behavioral: Negative for behavioral problems, confusion and sleep disturbance. The patient is not nervous/anxious.     Immunization History  Administered Date(s) Administered  . Influenza Split 11/13/2012, 12/13/2012, 11/30/2013, 10/09/2014  . Influenza, High Dose Seasonal PF 12/01/2013, 10/04/2014  . Influenza-Unspecified 10/24/2010  . Pneumococcal Conjugate-13 09/28/2013  . Pneumococcal Polysaccharide-23 03/05/2012  . Tdap 08/27/2000, 10/16/2010  . Zoster 01/11/2013   Pertinent  Health Maintenance Due  Topic Date Due  . INFLUENZA VACCINE  09/11/2019  . DEXA SCAN  Completed  . PNA vac Low Risk Adult  Completed   Fall Risk  03/02/2014  Falls in the past year? No   Functional Status Survey:    Vitals:   08/05/19 1312  BP: 114/70  Pulse: 83  Resp: 16  Temp: 97.8 F (36.6 C)  Weight: 172 lb (78 kg)  Height: _0   (1.6 m)   Body mass index is 30.47 kg/m. Physical Exam Vitals and nursing note reviewed.  Constitutional:      General: She is not in acute distress.    Appearance: Normal appearance. She is obese. She is not ill-appearing, toxic-appearing or diaphoretic.  HENT:     Head: Normocephalic and atraumatic.     Nose: Nose normal.     Mouth/Throat:     Mouth: Mucous membranes are moist.  Eyes:     Extraocular Movements: Extraocular movements intact.     Conjunctiva/sclera: Conjunctivae normal.     Pupils: Pupils are equal, round, and reactive to light.  Cardiovascular:     Rate and Rhythm: Normal rate and regular rhythm.     Heart sounds: No murmur heard.   Pulmonary:     Effort: Pulmonary effort is normal.     Breath sounds: No wheezing or rales.  Abdominal:     General: Bowel sounds are normal. There is no distension.     Palpations: Abdomen is soft.     Tenderness: There is no abdominal tenderness. There is no right CVA tenderness, left CVA tenderness, guarding or rebound.     Hernia: A hernia is present.     Comments: Incision hernia  Musculoskeletal:        General: Tenderness present.     Cervical back: Normal range of motion and neck supple.     Right lower leg: No edema.     Left lower leg: No edema.     Comments: Right lower back trigger point tenderness when bruise located.   Skin:    General: Skin is warm and dry.     Findings: Bruising present.     Comments: Right lower back small bruise.   Neurological:     General: No focal deficit present.     Mental Status: She is alert and oriented to person, place, and time. Mental status is at baseline.     Motor: No weakness.     Coordination: Coordination normal.  Gait: Gait abnormal.  Psychiatric:        Mood and Affect: Mood normal.        Behavior: Behavior normal.        Thought Content: Thought content normal.        Judgment: Judgment normal.     Labs reviewed: Recent Labs    08/04/19 1230  NA 133*  K  4.3  CL 103  CO2 19*  GLUCOSE 136*  BUN 27*  CREATININE 1.06*  CALCIUM 9.7   Recent Labs    08/04/19 1230  AST 36  ALT 23  ALKPHOS 38  BILITOT 0.9  PROT 7.7  ALBUMIN 3.7   Recent Labs    08/04/19 1230  WBC 7.6  NEUTROABS 5.4  HGB 13.2  HCT 40.5  MCV 90.6  PLT 191   Lab Results  Component Value Date   TSH 3.885 05/19/2011   No results found for: HGBA1C No results found for: CHOL, HDL, LDLCALC, LDLDIRECT, TRIG, CHOLHDL  Significant Diagnostic Results in last 30 days:  CT Head Wo Contrast  Result Date: 08/04/2019 CLINICAL DATA:  Posttraumatic EXAM: CT HEAD WITHOUT CONTRAST TECHNIQUE: Contiguous axial images were obtained from the base of the skull through the vertex without intravenous contrast. COMPARISON:  September 09, 2017 FINDINGS: Brain: No evidence of acute territorial infarction, hemorrhage, hydrocephalus,extra-axial collection or mass lesion/mass effect. There is dilatation the ventricles and sulci consistent with age-related atrophy. Low-attenuation changes in the deep white matter consistent with small vessel ischemia. Vascular: No hyperdense vessel or unexpected calcification. Skull: The skull is intact. No fracture or focal lesion identified. Sinuses/Orbits: There is complete opacification of left sphenoid sinus. The orbits and globes intact. Other: None IMPRESSION: No acute intracranial abnormality. Findings consistent with age related atrophy and chronic small vessel ischemia Chronic left seen sphenoid sinusitis Electronically Signed   By: Prudencio Pair M.D.   On: 08/04/2019 13:21   DG Pelvis Portable  Result Date: 08/04/2019 CLINICAL DATA:  Fall right hip pain EXAM: PORTABLE PELVIS 1-2 VIEWS COMPARISON:  None. FINDINGS: There is no evidence of pelvic fracture or diastasis. No pelvic bone lesions are seen. Scattered vascular calcifications are noted. IMPRESSION: No acute osseous abnormality. Electronically Signed   By: Prudencio Pair M.D.   On: 08/04/2019 12:23   DG  Chest Port 1 View  Result Date: 08/04/2019 CLINICAL DATA:  Status post fall.  Shortness of breath. EXAM: PORTABLE CHEST 1 VIEW COMPARISON:  PA and lateral chest 01/25/2014 FINDINGS: Lungs clear. Heart size normal. Atherosclerosis. No pneumothorax or pleural fluid. No acute or focal bony abnormality IMPRESSION: No acute disease. Aortic Atherosclerosis (ICD10-I70.0). Electronically Signed   By: Inge Rise M.D.   On: 08/04/2019 12:50    Assessment/Plan Muscle spasm of right lower extremity muscle spasm right mid to lower back parallels to the spine. Small bruise and trigger point of tenderness noted, continue prn Tylenol, apply Lidoderm patch to the tender area on 12hr, off 12hr, will have Methocarbamol 213m prn in am available to her.    Hyponatremia Mild serum Na 133 08/04/19  Chronic kidney disease (CKD), stage III (moderate) eGFR 47 08/04/19  GERD Stable, continue Omeprazole 263mbid, prn Famotidine 1022m Osteoarthritis S/p R+L knee replacement.   Disorder of bone and cartilage Takes Prolia, Ca, Vit D  Insomnia Reported no negative side effects from long term using Zolpidem, continue Zolpidem 5mg40m prn  Essential hypertension Blood pressure is controlled, continue Amlodipine 5mg 72m Lisinopril 20mg 34mNadolol 20mg q99m  Slow transit constipation The patient desires Colace 169m bid.     Family/ staff Communication: plan of care reviewed with the patient and charge nurse.   Labs/tests ordered:  none  Time spend 35 minutes.

## 2019-08-05 NOTE — Assessment & Plan Note (Signed)
The patient desires Colace 100mg  bid.

## 2019-08-05 NOTE — Assessment & Plan Note (Signed)
eGFR 47 08/04/19

## 2019-08-05 NOTE — Assessment & Plan Note (Signed)
muscle spasm right mid to lower back parallels to the spine. Small bruise and trigger point of tenderness noted, continue prn Tylenol, apply Lidoderm patch to the tender area on 12hr, off 12hr, will have Methocarbamol 250mg  prn in am available to her.

## 2019-08-05 NOTE — Assessment & Plan Note (Signed)
Stable, continue Omeprazole 20mg  bid, prn Famotidine 10mg 

## 2019-08-05 NOTE — Assessment & Plan Note (Signed)
S/p R+L knee replacement.

## 2019-08-05 NOTE — Assessment & Plan Note (Signed)
Mild serum Na 133 08/04/19

## 2019-08-08 ENCOUNTER — Encounter: Payer: Self-pay | Admitting: Internal Medicine

## 2019-08-08 ENCOUNTER — Non-Acute Institutional Stay (SKILLED_NURSING_FACILITY): Payer: Medicare Other | Admitting: Internal Medicine

## 2019-08-08 DIAGNOSIS — E871 Hypo-osmolality and hyponatremia: Secondary | ICD-10-CM | POA: Diagnosis not present

## 2019-08-08 DIAGNOSIS — Z9181 History of falling: Secondary | ICD-10-CM | POA: Diagnosis not present

## 2019-08-08 DIAGNOSIS — N1831 Chronic kidney disease, stage 3a: Secondary | ICD-10-CM

## 2019-08-08 DIAGNOSIS — K219 Gastro-esophageal reflux disease without esophagitis: Secondary | ICD-10-CM

## 2019-08-08 DIAGNOSIS — I10 Essential (primary) hypertension: Secondary | ICD-10-CM

## 2019-08-08 DIAGNOSIS — M62838 Other muscle spasm: Secondary | ICD-10-CM

## 2019-08-08 DIAGNOSIS — R2681 Unsteadiness on feet: Secondary | ICD-10-CM | POA: Diagnosis not present

## 2019-08-08 DIAGNOSIS — R41841 Cognitive communication deficit: Secondary | ICD-10-CM | POA: Diagnosis not present

## 2019-08-08 DIAGNOSIS — M6281 Muscle weakness (generalized): Secondary | ICD-10-CM | POA: Diagnosis not present

## 2019-08-08 NOTE — Progress Notes (Signed)
Provider:  Veleta Miners MD Location:    [facility] Room Number: 10 Place of Service:  SNF (31)  PCP: Hulan Fess, MD Patient Care Team: Hulan Fess, MD as PCP - General (Family Medicine) Brien Few, MD as Consulting Physician (Obstetrics and Gynecology) Marti Sleigh, MD as Consulting Physician (Gynecology) Nancy Marus, MD as Consulting Physician (Gynecologic Oncology)  Extended Emergency Contact Information Primary Emergency Contact: [facility] Address: [address]          [place], [place] [address] [name] of [place] Phone: [phone] Mobile Phone: [phone] Relation: Daughter  Code Status: Full Code Goals of Care: Advanced Directive information Advanced Directives 08/08/2019  Does Patient Have a Medical Advance Directive? Yes  Type of Paramedic of [place];Living will  Does patient want to make changes to medical advance directive? No - Patient declined  Copy of [place] in Chart? Yes - validated most recent copy scanned in chart (See row information)  Pre-existing out of facility DNR order (yellow form or pink MOST form) -      Chief Complaint  Patient presents with  . New Admit To SNF    Admission    HPI: Patient is a 86 y.o. female seen today for admission to SNF for therapy Patient has a history of hypertension, osteoporosis, GERD, insomnia  Patient fell in her apartment.  She says her feet got stuck in a rug.  She started having back pain.  She called her daughter who took her to emergency room.Marland Kitchen  Her imaging including CT of head and x-ray of pelvis was negative for any acute findings. She is now in SNF for therapy. Her pain seems to be controlled with Tylenol and lidocaine patch She is actually doing well walking with the walker. Her only weakness is inability to get up from the bed. She is able to get up from the chair. Patient also complaining of  constipation No other acute problems. She lives in 2 room apartment by herself.  Her daughter who is her POA lives in [place].  Patient was driving before the fall  Past Medical History:  Diagnosis Date  . Anemia    hx of   . Anemia associated with acute blood loss 04/03/2011  . Arthritis    knees   . ARTHRITIS, SEPTIC 05/28/2009   Qualifier: Diagnosis of  By: Tommy Medal MD, Roderic Scarce    . Ascites   . Blood transfusion    hx of 2011   . Complication of anesthesia    Sodium drops per pt   . Cystitis   . DIARRHEA, ANTIBIOTIC ASSOCIATED 05/31/2009   Qualifier: Diagnosis of  By: Tommy Medal MD, Roderic Scarce    . GERD (gastroesophageal reflux disease)   . H/O hiatal hernia   . Hyperlipidemia   . Hypertension   . Hyponatremia   . Neutropenia with fever ([place]) 05/18/2011  . OSTEOMYELITIS, CHRONIC, LOWER LEG 04/23/2009   Qualifier: Diagnosis of  By: Johnnye Sima MD, Dellis Filbert    . Osteopenia   . OSTEOPOROSIS 04/23/2009   Qualifier: Diagnosis of  By: Johnnye Sima MD, Dellis Filbert    . Ovarian cancer ([place]) 01/25/2011  . Pneumonia    hx of   . Postmenopausal atrophic vaginitis   . PROSTHETIC JOINT COMPLICATION 4/48/1856   Qualifier: Diagnosis of  By: Tommy Medal MD, Roderic Scarce    . Renal disorder    Decreased kidney function  . Staph infection 2010   after knee replacement  . Urinary  frequency   . Vulvitis    Past Surgical History:  Procedure Laterality Date  . ABDOMINAL HYSTERECTOMY  04/01/2011   Procedure: HYSTERECTOMY ABDOMINAL;  Surgeon: Alvino Chapel, MD;  Location: [facility];  Service: Gynecology;  Laterality: N/A;  . APPENDECTOMY    . JOINT REPLACEMENT     R knee in 2008, 5 operations on L knee  . LAPAROTOMY  04/01/2011   Procedure: EXPLORATORY LAPAROTOMY;  Surgeon: Alvino Chapel, MD;  Location: [facility];  Service: Gynecology;  Laterality: N/A;  . OTHER SURGICAL HISTORY     hx of C section 1966  . SALPINGOOPHORECTOMY  04/01/2011   Procedure: SALPINGO OOPHERECTOMY;  Surgeon: Alvino Chapel, MD;  Location: [facility];  Service: Gynecology;  Laterality: Bilateral;  . TONSILLECTOMY      reports that she quit smoking about 67 years ago. She has never used smokeless tobacco. She reports previous alcohol use. She reports that she does not use drugs. Social History   Socioeconomic History  . Marital status: Widowed    Spouse name: Not on file  . Number of children: Not on file  . Years of education: Not on file  . Highest education level: Not on file  Occupational History  . Not on file  Tobacco Use  . Smoking status: Former Smoker    Quit date: 02/11/1952    Years since quitting: 67.5  . Smokeless tobacco: Never Used  Vaping Use  . Vaping Use: Never used  Substance and Sexual Activity  . Alcohol use: Not Currently  . Drug use: Never  . Sexual activity: Never  Other Topics Concern  . Not on file  Social History Narrative  . Not on file   Social Determinants of Health   Financial Resource Strain:   . Difficulty of Paying Living Expenses:   Food Insecurity:   . Worried About Charity fundraiser in the Last Year:   . Arboriculturist in the Last Year:   Transportation Needs:   . Film/video editor (Medical):   Marland Kitchen Lack of Transportation (Non-Medical):   Physical Activity:   . Days of Exercise per Week:   . Minutes of Exercise per Session:   Stress:   . Feeling of Stress :   Social Connections:   . Frequency of Communication with Friends and Family:   . Frequency of Social Gatherings with Friends and Family:   . Attends Religious Services:   . Active Member of Clubs or Organizations:   . Attends Archivist Meetings:   Marland Kitchen Marital Status:   Intimate Partner Violence:   . Fear of Current or Ex-Partner:   . Emotionally Abused:   Marland Kitchen Physically Abused:   . Sexually Abused:     Functional Status Survey:    Family History  Problem Relation Age of Onset  . Cancer Other        Bladder cancer  . Heart attack Brother   . Diabetes Brother       Health Maintenance  Topic Date Due  . COVID-19 Vaccine (1) Never done  . INFLUENZA VACCINE  09/11/2019  . TETANUS/TDAP  10/15/2020  . DEXA SCAN  Completed  . PNA vac Low Risk Adult  Completed    Allergies  Allergen Reactions  . Morphine And Related     Given after knee replacement and code blue occurred  . Penicillins     REACTION: hives  . Shellfish Allergy     Pt has shellfish  allergy only.  Has had IV contrast x 2 and did fine.    Allergies as of 08/08/2019      Reactions   Morphine And Related    Given after knee replacement and code blue occurred   Penicillins    REACTION: hives   Shellfish Allergy    Pt has shellfish allergy only.  Has had IV contrast x 2 and did fine.      Medication List       Accurate as of August 08, 2019  9:45 AM. If you have any questions, ask your nurse or doctor.        STOP taking these medications   acetaminophen 325 MG tablet Commonly known as: TYLENOL Stopped by: Virgie Dad, MD     TAKE these medications   amLODipine 5 MG tablet Commonly known as: NORVASC Take 5 mg by mouth daily.   azelastine 0.1 % nasal spray Commonly known as: ASTELIN Place 1 spray into both nostrils 2 (two) times daily as needed.   calcium carbonate 750 MG chewable tablet Commonly known as: TUMS EX Chew 1 tablet by mouth daily.   CENTRUM SILVER 50+MEN PO Take 1 tablet by mouth daily   denosumab 60 MG/ML Sosy injection Commonly known as: PROLIA Inject 60 mg into the skin every 6 (six) months.   docusate sodium 100 MG capsule Commonly known as: COLACE Take 100 mg by mouth 2 (two) times daily.   famotidine 10 MG tablet Commonly known as: PEPCID Take 10 mg by mouth daily as needed for heartburn or indigestion.   fenofibrate 145 MG tablet Commonly known as: TRICOR Take 145 mg by mouth daily. with food   LIDODERM EX Apply topically. 4% Apply Patch to R.back area Q AM for 12 Hours. Once A Morning   lisinopril 20 MG tablet Commonly  known as: ZESTRIL Take 20 mg by mouth daily.   Lovaza 1 g capsule Generic drug: omega-3 acid ethyl esters Take 1 g by mouth daily.   magnesium hydroxide 400 MG/5ML suspension Commonly known as: MILK OF MAGNESIA Take by mouth daily as needed for mild constipation.   methocarbamol 500 MG tablet Commonly known as: ROBAXIN Take 250 mg by mouth as needed for muscle spasms.   nadolol 20 MG tablet Commonly known as: CORGARD Take 20 mg by mouth daily.   omeprazole 20 MG capsule Commonly known as: PRILOSEC Take 20 mg by mouth 2 (two) times daily before a meal.   Red Yeast Rice 600 MG Caps Take 600 mg by mouth daily.   Systane 0.4-0.3 % Soln Generic drug: Polyethyl Glycol-Propyl Glycol Apply 1 drop to eye daily as needed.   VITAMIN B COMPLEX PO Take 1 tablet by mouth daily.   Vitamin D 50 MCG (2000 UT) tablet Take 2,000 Units by mouth daily.   vitamin E 180 MG (400 UNITS) capsule Take 400 Units by mouth daily.   zolpidem 10 MG tablet Commonly known as: AMBIEN Take 5-10 mg by mouth daily as needed for sleep.       Review of Systems  Vitals:   08/08/19 0930  BP: 134/76  Pulse: 68  Resp: 20  Temp: 98.4 F (36.9 C)  SpO2: 96%  Weight: 172 lb (78 kg)  Height: 5\' 3"  (1.6 m)   Body mass index is 30.47 kg/m. Physical Exam  Labs reviewed: Basic Metabolic Panel: Recent Labs    08/04/19 1230  NA 133*  K 4.3  CL 103  CO2 19*  GLUCOSE  136*  BUN 27*  CREATININE 1.06*  CALCIUM 9.7   Liver Function Tests: Recent Labs    08/04/19 1230  AST 36  ALT 23  ALKPHOS 38  BILITOT 0.9  PROT 7.7  ALBUMIN 3.7   No results for input(s): LIPASE, AMYLASE in the last 8760 hours. No results for input(s): AMMONIA in the last 8760 hours. CBC: Recent Labs    08/04/19 1230  WBC 7.6  NEUTROABS 5.4  HGB 13.2  HCT 40.5  MCV 90.6  PLT 191   Cardiac Enzymes: No results for input(s): CKTOTAL, CKMB, CKMBINDEX, TROPONINI in the last 8760 hours. BNP: Invalid input(s):  POCBNP No results found for: HGBA1C Lab Results  Component Value Date   TSH 3.885 05/19/2011   No results found for: VITAMINB12 No results found for: FOLATE Lab Results  Component Value Date   IRON 74 05/27/2011   TIBC 299 05/27/2011   FERRITIN 498 (H) 05/27/2011    Imaging and Procedures obtained prior to SNF admission: CT Head Wo Contrast  Result Date: 08/04/2019 CLINICAL DATA:  Posttraumatic EXAM: CT HEAD WITHOUT CONTRAST TECHNIQUE: Contiguous axial images were obtained from the base of the skull through the vertex without intravenous contrast. COMPARISON:  September 09, 2017 FINDINGS: Brain: No evidence of acute territorial infarction, hemorrhage, hydrocephalus,extra-axial collection or mass lesion/mass effect. There is dilatation the ventricles and sulci consistent with age-related atrophy. Low-attenuation changes in the deep white matter consistent with small vessel ischemia. Vascular: No hyperdense vessel or unexpected calcification. Skull: The skull is intact. No fracture or focal lesion identified. Sinuses/Orbits: There is complete opacification of left sphenoid sinus. The orbits and globes intact. Other: None IMPRESSION: No acute intracranial abnormality. Findings consistent with age related atrophy and chronic small vessel ischemia Chronic left seen sphenoid sinusitis Electronically Signed   By: Prudencio Pair M.D.   On: 08/04/2019 13:21   DG Pelvis Portable  Result Date: 08/04/2019 CLINICAL DATA:  Fall right hip pain EXAM: PORTABLE PELVIS 1-2 VIEWS COMPARISON:  None. FINDINGS: There is no evidence of pelvic fracture or diastasis. No pelvic bone lesions are seen. Scattered vascular calcifications are noted. IMPRESSION: No acute osseous abnormality. Electronically Signed   By: Prudencio Pair M.D.   On: 08/04/2019 12:23   DG Chest Port 1 View  Result Date: 08/04/2019 CLINICAL DATA:  Status post fall.  Shortness of breath. EXAM: PORTABLE CHEST 1 VIEW COMPARISON:  PA and lateral chest  01/25/2014 FINDINGS: Lungs clear. Heart size normal. Atherosclerosis. No pneumothorax or pleural fluid. No acute or focal bony abnormality IMPRESSION: No acute disease. Aortic Atherosclerosis (ICD10-I70.0). Electronically Signed   By: Inge Rise M.D.   On: 08/04/2019 12:50    Assessment/Plan Muscle spasm of right lower extremity Pain Controlled on Tylenol and Lidocaine patch Also PRN RObaxin  Hyponatremia Repeat BMP Stage 3a chronic kidney disease Repeat BMP Gastroesophageal reflux disease,  Continue  Pepcid Hypertension Controlled on Norvasc,Nadolol and Lisinopril Weakness D/W therapy Patient is doing well. She will be able to go back to her Apartment by end of weak InSomnia On Ambien PRN   Family/ staff Communication:   Labs/tests ordered: BMP to follow Sodium  Total time spent in this patient care encounter was  _45  minutes; greater than 50% of the visit spent counseling patient and staff, reviewing records , Labs and coordinating care for problems addressed at this encounter.

## 2019-08-09 DIAGNOSIS — R2681 Unsteadiness on feet: Secondary | ICD-10-CM | POA: Diagnosis not present

## 2019-08-09 DIAGNOSIS — R41841 Cognitive communication deficit: Secondary | ICD-10-CM | POA: Diagnosis not present

## 2019-08-09 DIAGNOSIS — M6281 Muscle weakness (generalized): Secondary | ICD-10-CM | POA: Diagnosis not present

## 2019-08-09 DIAGNOSIS — Z9181 History of falling: Secondary | ICD-10-CM | POA: Diagnosis not present

## 2019-08-10 DIAGNOSIS — R41841 Cognitive communication deficit: Secondary | ICD-10-CM | POA: Diagnosis not present

## 2019-08-10 DIAGNOSIS — Z9181 History of falling: Secondary | ICD-10-CM | POA: Diagnosis not present

## 2019-08-10 DIAGNOSIS — M6281 Muscle weakness (generalized): Secondary | ICD-10-CM | POA: Diagnosis not present

## 2019-08-10 DIAGNOSIS — R2681 Unsteadiness on feet: Secondary | ICD-10-CM | POA: Diagnosis not present

## 2019-08-11 DIAGNOSIS — R2681 Unsteadiness on feet: Secondary | ICD-10-CM | POA: Diagnosis not present

## 2019-08-11 DIAGNOSIS — M62838 Other muscle spasm: Secondary | ICD-10-CM | POA: Diagnosis not present

## 2019-08-11 DIAGNOSIS — Z9181 History of falling: Secondary | ICD-10-CM | POA: Diagnosis not present

## 2019-08-11 DIAGNOSIS — I10 Essential (primary) hypertension: Secondary | ICD-10-CM | POA: Diagnosis not present

## 2019-08-12 DIAGNOSIS — R2681 Unsteadiness on feet: Secondary | ICD-10-CM | POA: Diagnosis not present

## 2019-08-12 DIAGNOSIS — Z9181 History of falling: Secondary | ICD-10-CM | POA: Diagnosis not present

## 2019-08-12 DIAGNOSIS — M62838 Other muscle spasm: Secondary | ICD-10-CM | POA: Diagnosis not present

## 2019-08-15 DIAGNOSIS — Z9181 History of falling: Secondary | ICD-10-CM | POA: Diagnosis not present

## 2019-08-15 DIAGNOSIS — R2681 Unsteadiness on feet: Secondary | ICD-10-CM | POA: Diagnosis not present

## 2019-08-15 DIAGNOSIS — M62838 Other muscle spasm: Secondary | ICD-10-CM | POA: Diagnosis not present

## 2019-08-17 DIAGNOSIS — M545 Low back pain: Secondary | ICD-10-CM | POA: Diagnosis not present

## 2019-08-17 DIAGNOSIS — M6281 Muscle weakness (generalized): Secondary | ICD-10-CM | POA: Diagnosis not present

## 2019-08-17 DIAGNOSIS — Z9181 History of falling: Secondary | ICD-10-CM | POA: Diagnosis not present

## 2019-08-17 DIAGNOSIS — R2681 Unsteadiness on feet: Secondary | ICD-10-CM | POA: Diagnosis not present

## 2019-08-22 DIAGNOSIS — Z9181 History of falling: Secondary | ICD-10-CM | POA: Diagnosis not present

## 2019-08-22 DIAGNOSIS — M6281 Muscle weakness (generalized): Secondary | ICD-10-CM | POA: Diagnosis not present

## 2019-08-22 DIAGNOSIS — M545 Low back pain: Secondary | ICD-10-CM | POA: Diagnosis not present

## 2019-08-22 DIAGNOSIS — R2681 Unsteadiness on feet: Secondary | ICD-10-CM | POA: Diagnosis not present

## 2019-08-24 DIAGNOSIS — M545 Low back pain: Secondary | ICD-10-CM | POA: Diagnosis not present

## 2019-08-24 DIAGNOSIS — Z9181 History of falling: Secondary | ICD-10-CM | POA: Diagnosis not present

## 2019-08-24 DIAGNOSIS — M6281 Muscle weakness (generalized): Secondary | ICD-10-CM | POA: Diagnosis not present

## 2019-08-24 DIAGNOSIS — R2681 Unsteadiness on feet: Secondary | ICD-10-CM | POA: Diagnosis not present

## 2019-08-30 DIAGNOSIS — R2681 Unsteadiness on feet: Secondary | ICD-10-CM | POA: Diagnosis not present

## 2019-08-30 DIAGNOSIS — M6281 Muscle weakness (generalized): Secondary | ICD-10-CM | POA: Diagnosis not present

## 2019-08-30 DIAGNOSIS — Z9181 History of falling: Secondary | ICD-10-CM | POA: Diagnosis not present

## 2019-08-30 DIAGNOSIS — M545 Low back pain: Secondary | ICD-10-CM | POA: Diagnosis not present

## 2019-09-01 DIAGNOSIS — M545 Low back pain: Secondary | ICD-10-CM | POA: Diagnosis not present

## 2019-09-01 DIAGNOSIS — Z9181 History of falling: Secondary | ICD-10-CM | POA: Diagnosis not present

## 2019-09-01 DIAGNOSIS — M6281 Muscle weakness (generalized): Secondary | ICD-10-CM | POA: Diagnosis not present

## 2019-09-01 DIAGNOSIS — R2681 Unsteadiness on feet: Secondary | ICD-10-CM | POA: Diagnosis not present

## 2019-09-08 DIAGNOSIS — M6281 Muscle weakness (generalized): Secondary | ICD-10-CM | POA: Diagnosis not present

## 2019-09-08 DIAGNOSIS — Z9181 History of falling: Secondary | ICD-10-CM | POA: Diagnosis not present

## 2019-09-08 DIAGNOSIS — R2681 Unsteadiness on feet: Secondary | ICD-10-CM | POA: Diagnosis not present

## 2019-09-08 DIAGNOSIS — M545 Low back pain: Secondary | ICD-10-CM | POA: Diagnosis not present

## 2019-09-13 DIAGNOSIS — M6281 Muscle weakness (generalized): Secondary | ICD-10-CM | POA: Diagnosis not present

## 2019-09-13 DIAGNOSIS — R2681 Unsteadiness on feet: Secondary | ICD-10-CM | POA: Diagnosis not present

## 2019-09-13 DIAGNOSIS — Z9181 History of falling: Secondary | ICD-10-CM | POA: Diagnosis not present

## 2019-09-13 DIAGNOSIS — M545 Low back pain: Secondary | ICD-10-CM | POA: Diagnosis not present

## 2019-10-04 DIAGNOSIS — M542 Cervicalgia: Secondary | ICD-10-CM | POA: Diagnosis not present

## 2019-10-04 DIAGNOSIS — Z20822 Contact with and (suspected) exposure to covid-19: Secondary | ICD-10-CM | POA: Diagnosis not present

## 2019-10-04 DIAGNOSIS — J029 Acute pharyngitis, unspecified: Secondary | ICD-10-CM | POA: Diagnosis not present

## 2019-10-06 ENCOUNTER — Other Ambulatory Visit: Payer: Self-pay

## 2019-10-06 ENCOUNTER — Encounter (HOSPITAL_COMMUNITY): Payer: Self-pay | Admitting: *Deleted

## 2019-10-06 DIAGNOSIS — C786 Secondary malignant neoplasm of retroperitoneum and peritoneum: Secondary | ICD-10-CM | POA: Diagnosis present

## 2019-10-06 DIAGNOSIS — K219 Gastro-esophageal reflux disease without esophagitis: Secondary | ICD-10-CM | POA: Diagnosis present

## 2019-10-06 DIAGNOSIS — E861 Hypovolemia: Secondary | ICD-10-CM | POA: Diagnosis present

## 2019-10-06 DIAGNOSIS — R5381 Other malaise: Secondary | ICD-10-CM | POA: Diagnosis present

## 2019-10-06 DIAGNOSIS — M810 Age-related osteoporosis without current pathological fracture: Secondary | ICD-10-CM | POA: Diagnosis present

## 2019-10-06 DIAGNOSIS — Z91013 Allergy to seafood: Secondary | ICD-10-CM

## 2019-10-06 DIAGNOSIS — Z20822 Contact with and (suspected) exposure to covid-19: Secondary | ICD-10-CM | POA: Diagnosis not present

## 2019-10-06 DIAGNOSIS — Z8249 Family history of ischemic heart disease and other diseases of the circulatory system: Secondary | ICD-10-CM

## 2019-10-06 DIAGNOSIS — N3000 Acute cystitis without hematuria: Secondary | ICD-10-CM | POA: Diagnosis not present

## 2019-10-06 DIAGNOSIS — R2681 Unsteadiness on feet: Secondary | ICD-10-CM | POA: Diagnosis not present

## 2019-10-06 DIAGNOSIS — E785 Hyperlipidemia, unspecified: Secondary | ICD-10-CM | POA: Diagnosis present

## 2019-10-06 DIAGNOSIS — R197 Diarrhea, unspecified: Secondary | ICD-10-CM | POA: Diagnosis not present

## 2019-10-06 DIAGNOSIS — Z9181 History of falling: Secondary | ICD-10-CM | POA: Diagnosis not present

## 2019-10-06 DIAGNOSIS — R8290 Unspecified abnormal findings in urine: Secondary | ICD-10-CM | POA: Diagnosis not present

## 2019-10-06 DIAGNOSIS — Z79899 Other long term (current) drug therapy: Secondary | ICD-10-CM

## 2019-10-06 DIAGNOSIS — Z90722 Acquired absence of ovaries, bilateral: Secondary | ICD-10-CM

## 2019-10-06 DIAGNOSIS — M545 Low back pain: Secondary | ICD-10-CM | POA: Diagnosis not present

## 2019-10-06 DIAGNOSIS — Z885 Allergy status to narcotic agent status: Secondary | ICD-10-CM

## 2019-10-06 DIAGNOSIS — E872 Acidosis: Secondary | ICD-10-CM | POA: Diagnosis present

## 2019-10-06 DIAGNOSIS — E860 Dehydration: Secondary | ICD-10-CM | POA: Diagnosis present

## 2019-10-06 DIAGNOSIS — D638 Anemia in other chronic diseases classified elsewhere: Secondary | ICD-10-CM | POA: Diagnosis present

## 2019-10-06 DIAGNOSIS — M6281 Muscle weakness (generalized): Secondary | ICD-10-CM | POA: Diagnosis not present

## 2019-10-06 DIAGNOSIS — M542 Cervicalgia: Secondary | ICD-10-CM | POA: Diagnosis not present

## 2019-10-06 DIAGNOSIS — Z880 Allergy status to penicillin: Secondary | ICD-10-CM

## 2019-10-06 DIAGNOSIS — I129 Hypertensive chronic kidney disease with stage 1 through stage 4 chronic kidney disease, or unspecified chronic kidney disease: Secondary | ICD-10-CM | POA: Diagnosis present

## 2019-10-06 DIAGNOSIS — R531 Weakness: Secondary | ICD-10-CM | POA: Diagnosis not present

## 2019-10-06 DIAGNOSIS — Z87891 Personal history of nicotine dependence: Secondary | ICD-10-CM

## 2019-10-06 DIAGNOSIS — E871 Hypo-osmolality and hyponatremia: Secondary | ICD-10-CM | POA: Diagnosis not present

## 2019-10-06 DIAGNOSIS — Z96653 Presence of artificial knee joint, bilateral: Secondary | ICD-10-CM | POA: Diagnosis present

## 2019-10-06 DIAGNOSIS — Z90710 Acquired absence of both cervix and uterus: Secondary | ICD-10-CM

## 2019-10-06 DIAGNOSIS — Z8543 Personal history of malignant neoplasm of ovary: Secondary | ICD-10-CM

## 2019-10-06 DIAGNOSIS — Z833 Family history of diabetes mellitus: Secondary | ICD-10-CM

## 2019-10-06 DIAGNOSIS — Z8052 Family history of malignant neoplasm of bladder: Secondary | ICD-10-CM

## 2019-10-06 DIAGNOSIS — R5383 Other fatigue: Secondary | ICD-10-CM | POA: Diagnosis not present

## 2019-10-06 DIAGNOSIS — N1831 Chronic kidney disease, stage 3a: Secondary | ICD-10-CM | POA: Diagnosis present

## 2019-10-06 LAB — CBC
HCT: 37.6 % (ref 36.0–46.0)
Hemoglobin: 12.8 g/dL (ref 12.0–15.0)
MCH: 30.4 pg (ref 26.0–34.0)
MCHC: 34 g/dL (ref 30.0–36.0)
MCV: 89.3 fL (ref 80.0–100.0)
Platelets: 334 10*3/uL (ref 150–400)
RBC: 4.21 MIL/uL (ref 3.87–5.11)
RDW: 12.8 % (ref 11.5–15.5)
WBC: 9.2 10*3/uL (ref 4.0–10.5)
nRBC: 0 % (ref 0.0–0.2)

## 2019-10-06 LAB — URINALYSIS, ROUTINE W REFLEX MICROSCOPIC
Bilirubin Urine: NEGATIVE
Glucose, UA: NEGATIVE mg/dL
Ketones, ur: NEGATIVE mg/dL
Nitrite: NEGATIVE
Protein, ur: NEGATIVE mg/dL
Specific Gravity, Urine: 1.011 (ref 1.005–1.030)
WBC, UA: >50 WBC/hpf — ABNORMAL HIGH (ref 0–5)
pH: 5 (ref 5.0–8.0)

## 2019-10-06 LAB — BASIC METABOLIC PANEL
Anion gap: 9 (ref 5–15)
BUN: 34 mg/dL — ABNORMAL HIGH (ref 8–23)
CO2: 20 mmol/L — ABNORMAL LOW (ref 22–32)
Calcium: 9 mg/dL (ref 8.9–10.3)
Chloride: 99 mmol/L (ref 98–111)
Creatinine, Ser: 1.22 mg/dL — ABNORMAL HIGH (ref 0.44–1.00)
GFR calc Af Amer: 46 mL/min — ABNORMAL LOW (ref >60)
GFR calc non Af Amer: 40 mL/min — ABNORMAL LOW (ref >60)
Glucose, Bld: 129 mg/dL — ABNORMAL HIGH (ref 70–99)
Potassium: 4.3 mmol/L (ref 3.5–5.1)
Sodium: 128 mmol/L — ABNORMAL LOW (ref 135–145)

## 2019-10-06 NOTE — ED Notes (Signed)
Daughter said that her mother's doctor sent her here because her sodium level is high.  Daughter left a note with her mother for the nurse to read and is waiting outside.

## 2019-10-06 NOTE — ED Triage Notes (Signed)
Pt states she was sent here by her doctor for low sodium levels. States she has felt very weak lately.

## 2019-10-07 ENCOUNTER — Encounter (HOSPITAL_COMMUNITY): Payer: Self-pay | Admitting: Internal Medicine

## 2019-10-07 ENCOUNTER — Inpatient Hospital Stay (HOSPITAL_COMMUNITY)
Admission: EM | Admit: 2019-10-07 | Discharge: 2019-10-10 | DRG: 641 | Disposition: A | Discharge status: Home Health | Payer: Medicare Other | Attending: Internal Medicine | Admitting: Internal Medicine

## 2019-10-07 DIAGNOSIS — C482 Malignant neoplasm of peritoneum, unspecified: Secondary | ICD-10-CM | POA: Diagnosis present

## 2019-10-07 DIAGNOSIS — R197 Diarrhea, unspecified: Secondary | ICD-10-CM | POA: Diagnosis not present

## 2019-10-07 DIAGNOSIS — I10 Essential (primary) hypertension: Secondary | ICD-10-CM | POA: Diagnosis not present

## 2019-10-07 DIAGNOSIS — N3000 Acute cystitis without hematuria: Secondary | ICD-10-CM | POA: Diagnosis not present

## 2019-10-07 DIAGNOSIS — R531 Weakness: Secondary | ICD-10-CM

## 2019-10-07 DIAGNOSIS — E871 Hypo-osmolality and hyponatremia: Principal | ICD-10-CM

## 2019-10-07 DIAGNOSIS — N390 Urinary tract infection, site not specified: Secondary | ICD-10-CM | POA: Diagnosis present

## 2019-10-07 LAB — CORTISOL: Cortisol, Plasma: 21.7 ug/dL

## 2019-10-07 LAB — OSMOLALITY, URINE: Osmolality, Ur: 395 mOsm/kg (ref 300–900)

## 2019-10-07 LAB — CBC
HCT: 36.9 % (ref 36.0–46.0)
Hemoglobin: 12.2 g/dL (ref 12.0–15.0)
MCH: 29.9 pg (ref 26.0–34.0)
MCHC: 33.1 g/dL (ref 30.0–36.0)
MCV: 90.4 fL (ref 80.0–100.0)
Platelets: 320 10*3/uL (ref 150–400)
RBC: 4.08 MIL/uL (ref 3.87–5.11)
RDW: 12.8 % (ref 11.5–15.5)
WBC: 8.7 10*3/uL (ref 4.0–10.5)
nRBC: 0 % (ref 0.0–0.2)

## 2019-10-07 LAB — BASIC METABOLIC PANEL
Anion gap: 9 (ref 5–15)
BUN: 26 mg/dL — ABNORMAL HIGH (ref 8–23)
CO2: 16 mmol/L — ABNORMAL LOW (ref 22–32)
Calcium: 8.2 mg/dL — ABNORMAL LOW (ref 8.9–10.3)
Chloride: 104 mmol/L (ref 98–111)
Creatinine, Ser: 0.91 mg/dL (ref 0.44–1.00)
GFR calc Af Amer: >60 mL/min (ref >60)
GFR calc non Af Amer: 56 mL/min — ABNORMAL LOW (ref >60)
Glucose, Bld: 129 mg/dL — ABNORMAL HIGH (ref 70–99)
Potassium: 3.9 mmol/L (ref 3.5–5.1)
Sodium: 129 mmol/L — ABNORMAL LOW (ref 135–145)

## 2019-10-07 LAB — CBG MONITORING, ED: Glucose-Capillary: 128 mg/dL — ABNORMAL HIGH (ref 70–99)

## 2019-10-07 LAB — TSH: TSH: 1.947 u[IU]/mL (ref 0.350–4.500)

## 2019-10-07 LAB — SARS CORONAVIRUS 2 BY RT PCR (HOSPITAL ORDER, PERFORMED IN ~~LOC~~ HOSPITAL LAB): SARS Coronavirus 2: NEGATIVE

## 2019-10-07 LAB — SODIUM, URINE, RANDOM: Sodium, Ur: 29 mmol/L

## 2019-10-07 MED ORDER — NADOLOL 20 MG PO TABS
20.0000 mg | ORAL_TABLET | Freq: Every day | ORAL | Status: DC
  Administered 2019-10-07 – 2019-10-10 (×4): 20 mg via ORAL
  Filled 2019-10-07 (×4): qty 1

## 2019-10-07 MED ORDER — HYDRALAZINE HCL 20 MG/ML IJ SOLN
10.0000 mg | INTRAMUSCULAR | Status: DC | PRN
  Administered 2019-10-07: 10 mg via INTRAVENOUS
  Filled 2019-10-07: qty 1

## 2019-10-07 MED ORDER — AMLODIPINE BESYLATE 5 MG PO TABS
5.0000 mg | ORAL_TABLET | Freq: Every day | ORAL | Status: DC
  Administered 2019-10-07 – 2019-10-10 (×4): 5 mg via ORAL
  Filled 2019-10-07 (×4): qty 1

## 2019-10-07 MED ORDER — FENOFIBRATE 160 MG PO TABS
160.0000 mg | ORAL_TABLET | Freq: Every day | ORAL | Status: DC
  Administered 2019-10-07 – 2019-10-10 (×4): 160 mg via ORAL
  Filled 2019-10-07 (×5): qty 1

## 2019-10-07 MED ORDER — ZOLPIDEM TARTRATE 5 MG PO TABS
5.0000 mg | ORAL_TABLET | Freq: Once | ORAL | Status: AC
  Administered 2019-10-07: 5 mg via ORAL
  Filled 2019-10-07: qty 1

## 2019-10-07 MED ORDER — OMEGA-3-ACID ETHYL ESTERS 1 G PO CAPS
1.0000 g | ORAL_CAPSULE | Freq: Every day | ORAL | Status: DC
  Administered 2019-10-07 – 2019-10-10 (×4): 1 g via ORAL
  Filled 2019-10-07 (×4): qty 1

## 2019-10-07 MED ORDER — CIPROFLOXACIN IN D5W 200 MG/100ML IV SOLN
200.0000 mg | Freq: Two times a day (BID) | INTRAVENOUS | Status: DC
  Filled 2019-10-07: qty 100

## 2019-10-07 MED ORDER — ZOLPIDEM TARTRATE 5 MG PO TABS
5.0000 mg | ORAL_TABLET | Freq: Every evening | ORAL | Status: DC | PRN
  Administered 2019-10-07 – 2019-10-09 (×3): 5 mg via ORAL
  Filled 2019-10-07 (×3): qty 1

## 2019-10-07 MED ORDER — PANTOPRAZOLE SODIUM 40 MG PO TBEC
40.0000 mg | DELAYED_RELEASE_TABLET | Freq: Every day | ORAL | Status: DC
  Administered 2019-10-07 – 2019-10-10 (×4): 40 mg via ORAL
  Filled 2019-10-07 (×4): qty 1

## 2019-10-07 MED ORDER — SODIUM CHLORIDE 0.9 % IV BOLUS (SEPSIS)
1000.0000 mL | Freq: Once | INTRAVENOUS | Status: AC
  Administered 2019-10-07: 1000 mL via INTRAVENOUS

## 2019-10-07 MED ORDER — STERILE WATER FOR INJECTION IV SOLN
INTRAVENOUS | Status: DC
  Filled 2019-10-07 (×3): qty 9.62

## 2019-10-07 MED ORDER — LISINOPRIL 20 MG PO TABS
20.0000 mg | ORAL_TABLET | Freq: Every day | ORAL | Status: DC
  Administered 2019-10-07 – 2019-10-10 (×4): 20 mg via ORAL
  Filled 2019-10-07 (×4): qty 1

## 2019-10-07 MED ORDER — CIPROFLOXACIN IN D5W 400 MG/200ML IV SOLN
400.0000 mg | Freq: Once | INTRAVENOUS | Status: AC
  Administered 2019-10-07: 400 mg via INTRAVENOUS
  Filled 2019-10-07: qty 200

## 2019-10-07 MED ORDER — ONDANSETRON HCL 4 MG PO TABS: 4.0000 mg | ORAL_TABLET | Freq: Four times a day (QID) | ORAL | Status: DC | PRN

## 2019-10-07 MED ORDER — FAMOTIDINE 20 MG PO TABS: 10.0000 mg | ORAL_TABLET | Freq: Every day | ORAL | Status: DC | PRN

## 2019-10-07 MED ORDER — SODIUM CHLORIDE 0.9 % IV SOLN: INTRAVENOUS | Status: DC

## 2019-10-07 MED ORDER — ONDANSETRON HCL 4 MG/2ML IJ SOLN: 4.0000 mg | Freq: Four times a day (QID) | INTRAMUSCULAR | Status: DC | PRN

## 2019-10-07 MED ORDER — ENOXAPARIN SODIUM 40 MG/0.4ML ~~LOC~~ SOLN: 40.0000 mg | SUBCUTANEOUS | Status: DC

## 2019-10-07 NOTE — Progress Notes (Signed)
Pharmacy Antibiotic Note  [name] is a 86 y.o. female admitted on 10/07/2019 with UTI.  Pharmacy has been consulted for ciprofloxacin dosing.  Plan: Ciprofloxacin 200 mg IV q12h   Monitor clinical course, renal function, cultures as available   Height: 5\' 3"  (160 cm) Weight: 74.8 kg (165 lb) IBW/kg (Calculated) : 52.4  Temp (24hrs), Avg:98.6 F (37 C), Min:98.4 F (36.9 C), Max:98.7 F (37.1 C)  Recent Labs  Lab 10/06/19 2144  WBC 9.2  CREATININE 1.22*    Estimated Creatinine Clearance: 30.9 mL/min (A) (by C-G formula based on SCr of 1.22 mg/dL (H)).    Allergies  Allergen Reactions  . Morphine And Related     Given after knee replacement and code blue occurred  . Penicillins     REACTION: hives  . Shellfish Allergy     Pt has shellfish allergy only.  Has had IV contrast x 2 and did fine.      Thank you for allowing pharmacy to be a part of this patient's care.   Royetta Asal, PharmD, BCPS 10/07/2019 5:02 AM

## 2019-10-07 NOTE — Plan of Care (Signed)
  Problem: Clinical Measurements: Goal: Will remain free from infection Outcome: Progressing   Problem: Clinical Measurements: Goal: Respiratory complications will improve Outcome: Progressing   Problem: Activity: Goal: Risk for activity intolerance will decrease Outcome: Progressing   Problem: Coping: Goal: Level of anxiety will decrease Outcome: Progressing   Problem: Elimination: Goal: Will not experience complications related to bowel motility Outcome: Progressing   Problem: Pain Managment: Goal: General experience of comfort will improve Outcome: Progressing   Problem: Safety: Goal: Ability to remain free from injury will improve Outcome: Progressing

## 2019-10-07 NOTE — Evaluation (Signed)
Physical Therapy Evaluation Patient Details Name: [name] MRN: [id number] DOB: 29-Dec-1929 Today's Date: 10/07/2019   History of Present Illness  Pt is a 86 y.o. female with PMH of HTN, HLD, ovarian cancer in remission, chronic anemia who lives in an independent living facility. She had a fall a few weeks ago and recent diarrhea.  Pt sent by PCP for low sodium.  Admitted with hyponatremia.  Clinical Impression  Pt admitted with hyponatremia.  She presents at baseline mobility.  Demonstrated safe gait and transfers with rollator.  Pt was able to perform ADLs during therapy.  She had c/o chronic neck and shoulder pain that increased over the past week - reports tried to pick up rollator and put in car.  Additionally , with hyponatremia and rounded kyphotic posture with forward head.  Educated on shoulder retraction, chin tucks, and posture.  No further acute PT indicated.  Recommend f/u with HHPT as previously planned.      Follow Up Recommendations Home health PT (pt had HH at [facility] set up that was supposed to begin today)    Equipment Recommendations  None recommended by PT    Recommendations for Other Services       Precautions / Restrictions Precautions Precautions: Fall      Mobility  Bed Mobility Overal bed mobility: Needs Assistance Bed Mobility: Supine to Sit;Sit to Supine     Supine to sit: Supervision Sit to supine: Supervision   General bed mobility comments: utilized rollator at EOB as a rail  Transfers Overall transfer level: Needs assistance Equipment used: 4-wheeled walker Transfers: Sit to/from Stand Sit to Stand: Supervision         General transfer comment: performed x 3; performed toielting ADLs with supervision  Ambulation/Gait Ambulation/Gait assistance: Supervision Gait Distance (Feet): 400 Feet Assistive device: 4-wheeled walker Gait Pattern/deviations: Step-through pattern     General Gait Details: Pt demonstrating baseline gait;  steady; no LOB but at slower speed  Stairs            Wheelchair Mobility    Modified Rankin (Stroke Patients Only)       Balance Overall balance assessment: Needs assistance Sitting-balance support: No upper extremity supported Sitting balance-Leahy Scale: Good     Standing balance support: No upper extremity supported Standing balance-Leahy Scale: Good Standing balance comment: Performed washing hands and toileting ADls in standing                             Pertinent Vitals/Pain Pain Assessment: No/denies pain    Home Living Family/patient expects to be discharged to:: Other (Comment) ([facility] living) Living Arrangements: Alone   Type of Home: Independent living facility Home Access: Level entry     Home Layout: One level Home Equipment: Environmental consultant - 4 wheels;Cane - single point;Walker - 2 wheels;Wheelchair - manual Additional Comments: Pt from ILF and was previously very independent.     Prior Function Level of Independence: Independent with assistive device(s)         Comments: Uses RW or rollator all of the time; Independent with ADLS and IADLs; was still driving     Hand Dominance        Extremity/Trunk Assessment   Upper Extremity Assessment Upper Extremity Assessment: Overall WFL for tasks assessed    Lower Extremity Assessment Lower Extremity Assessment: Overall WFL for tasks assessed    Cervical / Trunk Assessment Cervical / Trunk Assessment:  Kyphotic (forward head and rounded shoulders)  Communication   Communication: HOH  Cognition Arousal/Alertness: Awake/alert Behavior During Therapy: WFL for tasks assessed/performed Overall Cognitive Status: Within Functional Limits for tasks assessed                                        General Comments General comments (skin integrity, edema, etc.): VSS.  Pt reports was going to get HHPT at facility for improved strength,  "Neck and shoulder massage" and heat therapy.  Pt educated on scapular retraction and chin tucks as well as posture.  Encouraged to walk with nursing or family and could perform AROM as able in bed and chair. Provided pt with 2 heat packs on upper traps    Exercises Other Exercises Other Exercises: scapular retraction and chin tucks x 10   Assessment/Plan    PT Assessment Patent does not need any further PT services  PT Problem List         PT Treatment Interventions      PT Goals (Current goals can be found in the Care Plan section)  Acute Rehab PT Goals Patient Stated Goal: return home PT Goal Formulation: All assessment and education complete, DC therapy Potential to Achieve Goals: Good    Frequency     Barriers to discharge        Co-evaluation               AM-PAC PT "6 Clicks" Mobility  Outcome Measure Help needed turning from your back to your side while in a flat bed without using bedrails?: None Help needed moving from lying on your back to sitting on the side of a flat bed without using bedrails?: None Help needed moving to and from a bed to a chair (including a wheelchair)?: None Help needed standing up from a chair using your arms (e.g., wheelchair or bedside chair)?: None Help needed to walk in hospital room?: None Help needed climbing 3-5 steps with a railing? : None 6 Click Score: 24    End of Session Equipment Utilized During Treatment: Gait belt Activity Tolerance: Patient tolerated treatment well Patient left: in bed;with call bell/phone within reach;with bed alarm set;with family/visitor present Nurse Communication: Mobility status      Time: 1455-1531 PT Time Calculation (min) (ACUTE ONLY): 36 min   Charges:   PT Evaluation $PT Eval Moderate Complexity: 1 Mod PT Treatments $Therapeutic Exercise: 8-22 mins        Abran Richard, PT Acute Rehab Services Pager [phone] [facility] [phone]    Karlton Lemon 10/07/2019, 3:41  PM

## 2019-10-07 NOTE — Plan of Care (Signed)

## 2019-10-07 NOTE — ED Provider Notes (Signed)
[facility] DEPT Provider Note   CSN: [id number] Arrival date & time: 10/06/19  2004     History Chief Complaint  Patient presents with  . Abnormal Labs    [name] is a 86 y.o. female.  The history is provided by the patient, a relative and a caregiver.  Weakness Severity:  Moderate Onset quality:  Gradual Duration:  1 week Timing:  Intermittent Progression:  Worsening Chronicity:  New Relieved by:  Nothing Worsened by:  Activity Associated symptoms: diarrhea   Associated symptoms: no abdominal pain, no chest pain, no cough, no dysuria, no fever, no hematochezia, no stroke symptoms, no syncope and no vomiting    Patient with extensive medical history including anemia, GERD, ovarian cancer presents with generalized weakness.  She reports over 7 days ago she began having pain in her neck and shoulders that she thought was due to lifting her walker.  Since that time she is been having increasing generalized weakness.  No focal weakness.  No falls or injuries.  No fevers or vomiting She has had bright yellow diarrhea of unclear etiology. Seen by PCP and was found to have a sodium level of 126 and was sent for admission.     Past Medical History:  Diagnosis Date  . Anemia    hx of   . Anemia associated with acute blood loss 04/03/2011  . Arthritis    knees   . ARTHRITIS, SEPTIC 05/28/2009   Qualifier: Diagnosis of  By: Tommy Medal MD, Roderic Scarce    . Ascites   . Blood transfusion    hx of 2011   . Complication of anesthesia    Sodium drops per pt   . Cystitis   . DIARRHEA, ANTIBIOTIC ASSOCIATED 05/31/2009   Qualifier: Diagnosis of  By: Tommy Medal MD, Roderic Scarce    . GERD (gastroesophageal reflux disease)   . H/O hiatal hernia   . Hyperlipidemia   . Hypertension   . Hyponatremia   . Neutropenia with fever ([place]) 05/18/2011  . OSTEOMYELITIS, CHRONIC, LOWER LEG 04/23/2009   Qualifier: Diagnosis of  By: Johnnye Sima MD, Dellis Filbert    . Osteopenia   .  OSTEOPOROSIS 04/23/2009   Qualifier: Diagnosis of  By: Johnnye Sima MD, Dellis Filbert    . Ovarian cancer ([place]) 01/25/2011  . Pneumonia    hx of   . Postmenopausal atrophic vaginitis   . PROSTHETIC JOINT COMPLICATION 8/67/6195   Qualifier: Diagnosis of  By: Tommy Medal MD, Roderic Scarce    . Renal disorder    Decreased kidney function  . Staph infection 2010   after knee replacement  . Urinary frequency   . Vulvitis     Patient Active Problem List   Diagnosis Date Noted  . Muscle spasm of right lower extremity 08/05/2019  . Hyponatremia 08/05/2019  . Insomnia 08/05/2019  . Slow transit constipation 08/05/2019  . Atherosclerosis of abdominal aorta ([place]) 10/02/2015  . Obesity (BMI 30-39.9) 10/31/2014  . Chronic kidney disease (CKD), stage III (moderate) 10/31/2014  . Ventral incisional hernia 10/31/2014  . Primary peritoneal carcinomatosis ([place]) 10/31/2014  . Incisional hernia, periumbilical, without obstruction or gangrene 11/03/2012  . Anemia associated with acute blood loss 04/03/2011  . Ovarian cancer ([place]) 01/25/2011  . HYPERLIPIDEMIA 04/23/2009  . Essential hypertension 04/23/2009  . GERD 04/23/2009  . Osteoarthritis 04/23/2009  . Disorder of bone and cartilage 04/23/2009    Past Surgical History:  Procedure Laterality Date  . ABDOMINAL HYSTERECTOMY  04/01/2011   Procedure: HYSTERECTOMY ABDOMINAL;  Surgeon: Alvino Chapel, MD;  Location: [facility];  Service: Gynecology;  Laterality: N/A;  . APPENDECTOMY    . JOINT REPLACEMENT     R knee in 2008, 5 operations on L knee  . LAPAROTOMY  04/01/2011   Procedure: EXPLORATORY LAPAROTOMY;  Surgeon: Alvino Chapel, MD;  Location: [facility];  Service: Gynecology;  Laterality: N/A;  . OTHER SURGICAL HISTORY     hx of C section 1966  . SALPINGOOPHORECTOMY  04/01/2011   Procedure: SALPINGO OOPHERECTOMY;  Surgeon: Alvino Chapel, MD;  Location: [facility];  Service: Gynecology;  Laterality: Bilateral;  . TONSILLECTOMY       OB  History    Gravida      Para      Term      Preterm      AB      Living  3     SAB      TAB      Ectopic      Multiple      Live Births              Family History  Problem Relation Age of Onset  . Cancer Other        Bladder cancer  . Heart attack Brother   . Diabetes Brother     Social History   Tobacco Use  . Smoking status: Former Smoker    Quit date: 02/11/1952    Years since quitting: 67.6  . Smokeless tobacco: Never Used  Vaping Use  . Vaping Use: Never used  Substance Use Topics  . Alcohol use: Not Currently  . Drug use: Never    Home Medications Prior to Admission medications   Medication Sig Start Date End Date Taking? Authorizing Provider  amLODipine (NORVASC) 5 MG tablet Take 5 mg by mouth daily. 12/29/18   [provider]  azelastine (ASTELIN) 0.1 % nasal spray Place 1 spray into both nostrils 2 (two) times daily as needed.  12/06/15   [provider]  B Complex Vitamins (VITAMIN B COMPLEX PO) Take 1 tablet by mouth daily.     [provider]  calcium carbonate (TUMS EX) 750 MG chewable tablet Chew 1 tablet by mouth daily.    [provider]  Cholecalciferol (VITAMIN D) 2000 UNITS tablet Take 2,000 Units by mouth daily.     [provider]  denosumab (PROLIA) 60 MG/ML SOSY injection Inject 60 mg into the skin every 6 (six) months.    [provider]  docusate sodium (COLACE) 100 MG capsule Take 100 mg by mouth 2 (two) times daily.    [provider]  famotidine (PEPCID) 10 MG tablet Take 10 mg by mouth daily as needed for heartburn or indigestion.    [provider]  fenofibrate (TRICOR) 145 MG tablet Take 145 mg by mouth daily. with food 07/24/18   [provider]  Lidocaine (LIDODERM EX) Apply topically. 4% Apply Patch to R.back area Q AM for 12 Hours. Once A Morning    [provider]  lisinopril (ZESTRIL) 20 MG tablet Take 20 mg by mouth daily.  10/18/18   [provider]  magnesium hydroxide (MILK OF MAGNESIA) 400 MG/5ML suspension Take by mouth daily as needed for mild constipation.    [provider]  methocarbamol (ROBAXIN) 500 MG tablet Take 250 mg by mouth as needed for muscle spasms.    [provider]  Multiple Vitamins-Minerals (CENTRUM SILVER 50+MEN PO) Take 1 tablet  by mouth daily    [provider]  nadolol (CORGARD) 20 MG tablet Take 20 mg by mouth daily.     [provider]  omega-3 acid ethyl esters (LOVAZA) 1 g capsule Take 1 g by mouth daily.     [provider]  omeprazole (PRILOSEC) 20 MG capsule Take 20 mg by mouth 2 (two) times daily before a meal.  11/16/11   [provider]  Polyethyl Glycol-Propyl Glycol (SYSTANE) 0.4-0.3 % SOLN Apply 1 drop to eye daily as needed.     [provider]  Red Yeast Rice 600 MG CAPS Take 600 mg by mouth daily.  09/03/12   [provider]  vitamin E 400 UNIT capsule Take 400 Units by mouth daily.     [provider]  zolpidem (AMBIEN) 10 MG tablet Take 5-10 mg by mouth daily as needed for sleep.     [provider]    Allergies    Morphine and related, Penicillins, and Shellfish allergy  Review of Systems   Review of Systems  Constitutional: Negative for fever.  Respiratory: Negative for cough.   Cardiovascular: Negative for chest pain and syncope.  Gastrointestinal: Positive for diarrhea. Negative for abdominal pain, hematochezia and vomiting.  Genitourinary: Negative for dysuria.  Neurological: Positive for weakness.  All other systems reviewed and are negative.   Physical Exam Updated Vital Signs BP (!) 188/88 (BP Location: Right Arm)   Pulse 80   Temp 98.4 F (36.9 C) (Oral)   Resp 16   Ht 1.6 m (5\' 3" )   Wt 74.8 kg   SpO2 99%   BMI 29.23 kg/m   Physical Exam CONSTITUTIONAL: Elderly, no acute distress HEAD: Normocephalic/atraumatic EYES: EOMI/PERRL ENMT: Mask in  place NECK: supple no meningeal signs, minimal cervical paraspinal tenderness SPINE/BACK:entire spine nontender CV: S1/S2 noted, no murmurs/rubs/gallops noted LUNGS: Lungs are clear to auscultation bilaterally, no apparent distress ABDOMEN: soft, nontender, no rebound or guarding, bowel sounds noted throughout abdomen GU:no cva tenderness NEURO: Pt is awake/alert/appropriate, moves all extremitiesx4.  No facial droop.  No arm or leg drift.  Equal handgrips noted. EXTREMITIES: pulses normal/equal, full ROM SKIN: warm, color normal PSYCH: no abnormalities of mood noted, alert and oriented to situation ED Results / Procedures / Treatments   Labs (all labs ordered are listed, but only abnormal results are displayed) Labs Reviewed  BASIC METABOLIC PANEL - Abnormal; Notable for the following components:      Result Value   Sodium 128 (*)    CO2 20 (*)    Glucose, Bld 129 (*)    BUN 34 (*)    Creatinine, Ser 1.22 (*)    GFR calc non Af Amer 40 (*)    GFR calc Af Amer 46 (*)    All other components within normal limits  URINALYSIS, ROUTINE W REFLEX MICROSCOPIC - Abnormal; Notable for the following components:   APPearance HAZY (*)    Hgb urine dipstick SMALL (*)    Leukocytes,Ua LARGE (*)    WBC, UA >50 (*)    Bacteria, UA FEW (*)    Non Squamous Epithelial 0-5 (*)    All other components within normal limits  CBG MONITORING, ED - Abnormal; Notable for the following components:   Glucose-Capillary 128 (*)    All other components within normal limits  SARS CORONAVIRUS 2 BY RT PCR [facility] ORDER, [place] LAB)  CBC    EKG EKG Interpretation  Date/Time:  Thursday October 06 2019 21:29:17 EDT Ventricular Rate:  82 PR Interval:    QRS Duration: 82 QT Interval:  380 QTC Calculation: 444 R Axis:   -25 Text Interpretation: Sinus rhythm Inferior infarct, old Consider anterior infarct 12 Lead; [place] since last tracing no significant change Confirmed by  Daleen Bo [phone]) on 10/06/2019 10:03:26 PM   Radiology No results found.  Procedures Procedures   Medications Ordered in ED Medications  ciprofloxacin (CIPRO) IVPB 400 mg (has no administration in time range)  sodium chloride 0.9 % bolus 1,000 mL (has no administration in time range)    ED Course  I have reviewed the triage vital signs and the nursing notes.  Pertinent labs  results that were available during my care of the patient were reviewed by me and considered in my medical decision making (see chart for details).    MDM Rules/Calculators/A&P                           2:32 AM Patient sent by PCP for generalized weakness and hyponatremia.  Patient found to have mild hyponatremia and UTI.  However no symptoms of UTI, denies dysuria or abdominal pain.  No fevers or vomiting.  She has had diarrhea which has likely led to her hyponatremia She reports generalized weakness, no signs of acute stroke or myelopathy.  She has appropriate equal strength in her extremities. However patient is having difficulty with mobility over the past week. We will admit patient to the hospital for rehydration and IV antibiotics.  Patient ordered 1 L of normal saline.    This patient presents to the ED for concern of generalized weakness, this involves an extensive number of treatment options, and is a complaint that carries with it a high risk of complications and morbidity.  The differential diagnosis includes dehydration, hyponatremia, electrolyte imbalance, urinary tract infection, CVA, ACS   Lab Tests:   I Ordered, reviewed, and interpreted labs, which included electrolytes, urinalysis, complete blood count  Medicines ordered:   I ordered medication fluids, Cipro for dehydration and UTI   Additional history obtained:   Additional history obtained from daughter  Previous records obtained and reviewed   Consultations Obtained:   I consulted [facility] hospitalist Dr. Hal Hope  and discussed lab and imaging findings  Reevaluation:  After the interventions stated above, I reevaluated the patient and found stable   Final Clinical Impression(s) / ED Diagnoses Final diagnoses:  Acute cystitis without hematuria  Hyponatremia  Weakness    Rx / DC Orders ED Discharge Orders    None       Ripley Fraise, MD 10/07/19 [phone]

## 2019-10-07 NOTE — H&P (Addendum)
History and Physical    [name] WPY:[id number] DOB: [birth year] DOA: 10/07/2019  PCP: Hulan Fess, MD  Patient coming from: Independent living facility.  Chief Complaint: Low sodium.  HPI: [name] is a 86 y.o. female with history of hypertension hyperlipidemia ovarian cancer in remission presents to the ER with complaints of having low sodium.  Patient states over the last 1 week patient has been having off-and-on loose yellowish diarrhea.  Few weeks ago patient had a fall and was placed on muscle relaxant.  Patient has been feeling weak and had gone to her primary care physician and was found to have a sodium of 126 and was referred to the ER.  Denies any vomiting denies any chest pain or shortness of breath.  ED Course: In the ER patient is hemodynamically stable with sodium of 128 creatinine 1.2, urine studies shows features concerning for UTI.  EKG shows normal sinus rhythm with nonspecific T wave inversions.  Covid test is pending.  Patient was given 1 L fluid bolus started on Cipro admitted for further management of hyponatremia with weakness and UTI and diarrhea.  Review of Systems: As per HPI, rest all negative.   Past Medical History:  Diagnosis Date  . Anemia    hx of   . Anemia associated with acute blood loss 04/03/2011  . Arthritis    knees   . ARTHRITIS, SEPTIC 05/28/2009   Qualifier: Diagnosis of  By: Tommy Medal MD, Roderic Scarce    . Ascites   . Blood transfusion    hx of 2011   . Complication of anesthesia    Sodium drops per pt   . Cystitis   . DIARRHEA, ANTIBIOTIC ASSOCIATED 05/31/2009   Qualifier: Diagnosis of  By: Tommy Medal MD, Roderic Scarce    . GERD (gastroesophageal reflux disease)   . H/O hiatal hernia   . Hyperlipidemia   . Hypertension   . Hyponatremia   . Neutropenia with fever ([name]) 05/18/2011  . OSTEOMYELITIS, CHRONIC, LOWER LEG 04/23/2009   Qualifier: Diagnosis of  By: Johnnye Sima MD, Dellis Filbert    . Osteopenia   . OSTEOPOROSIS 04/23/2009    Qualifier: Diagnosis of  By: Johnnye Sima MD, Dellis Filbert    . Ovarian cancer ([place]) 01/25/2011  . Pneumonia    hx of   . Postmenopausal atrophic vaginitis   . PROSTHETIC JOINT COMPLICATION 0/53/9767   Qualifier: Diagnosis of  By: Tommy Medal MD, Roderic Scarce    . Renal disorder    Decreased kidney function  . Staph infection 2010   after knee replacement  . Urinary frequency   . Vulvitis     Past Surgical History:  Procedure Laterality Date  . ABDOMINAL HYSTERECTOMY  04/01/2011   Procedure: HYSTERECTOMY ABDOMINAL;  Surgeon: Alvino Chapel, MD;  Location: [facility];  Service: Gynecology;  Laterality: N/A;  . APPENDECTOMY    . JOINT REPLACEMENT     R knee in 2008, 5 operations on L knee  . LAPAROTOMY  04/01/2011   Procedure: EXPLORATORY LAPAROTOMY;  Surgeon: Alvino Chapel, MD;  Location: [facility];  Service: Gynecology;  Laterality: N/A;  . OTHER SURGICAL HISTORY     hx of C section 1966  . SALPINGOOPHORECTOMY  04/01/2011   Procedure: SALPINGO OOPHERECTOMY;  Surgeon: Alvino Chapel, MD;  Location: [facility];  Service: Gynecology;  Laterality: Bilateral;  . TONSILLECTOMY       reports that she quit smoking about 67 years ago. She has never used smokeless tobacco. She reports previous  alcohol use. She reports that she does not use drugs.  Allergies  Allergen Reactions  . Morphine And Related     Given after knee replacement and code blue occurred  . Penicillins     REACTION: hives  . Shellfish Allergy     Pt has shellfish allergy only.  Has had IV contrast x 2 and did fine.    Family History  Problem Relation Age of Onset  . Cancer Other        Bladder cancer  . Heart attack Brother   . Diabetes Brother     Prior to Admission medications   Medication Sig Start Date End Date Taking? Authorizing Provider  amLODipine (NORVASC) 5 MG tablet Take 5 mg by mouth daily. 12/29/18   [provider]  azelastine (ASTELIN) 0.1 % nasal spray Place 1 spray into both  nostrils 2 (two) times daily as needed.  12/06/15   [provider]  B Complex Vitamins (VITAMIN B COMPLEX PO) Take 1 tablet by mouth daily.     [provider]  calcium carbonate (TUMS EX) 750 MG chewable tablet Chew 1 tablet by mouth daily.    [provider]  Cholecalciferol (VITAMIN D) 2000 UNITS tablet Take 2,000 Units by mouth daily.     [provider]  denosumab (PROLIA) 60 MG/ML SOSY injection Inject 60 mg into the skin every 6 (six) months.    [provider]  docusate sodium (COLACE) 100 MG capsule Take 100 mg by mouth 2 (two) times daily.    [provider]  famotidine (PEPCID) 10 MG tablet Take 10 mg by mouth daily as needed for heartburn or indigestion.    [provider]  fenofibrate (TRICOR) 145 MG tablet Take 145 mg by mouth daily. with food 07/24/18   [provider]  Lidocaine (LIDODERM EX) Apply topically. 4% Apply Patch to R.back area Q AM for 12 Hours. Once A Morning    [provider]  lisinopril (ZESTRIL) 20 MG tablet Take 20 mg by mouth daily. 10/18/18   [provider]  magnesium hydroxide (MILK OF MAGNESIA) 400 MG/5ML suspension Take by mouth daily as needed for mild constipation.    [provider]  methocarbamol (ROBAXIN) 500 MG tablet Take 250 mg by mouth as needed for muscle spasms.    [provider]  Multiple Vitamins-Minerals (CENTRUM SILVER 50+MEN PO) Take 1 tablet by mouth daily    [provider]  nadolol (CORGARD) 20 MG tablet Take 20 mg by mouth daily.     [provider]  omega-3 acid ethyl esters (LOVAZA) 1 g capsule Take 1 g by mouth daily.     [provider]  omeprazole (PRILOSEC) 20 MG capsule Take 20 mg by mouth 2 (two) times daily before a meal.  11/16/11   [provider]  Polyethyl Glycol-Propyl Glycol (SYSTANE) 0.4-0.3 % SOLN Apply 1 drop to eye daily as needed.     [provider]  Red Yeast Rice 600  MG CAPS Take 600 mg by mouth daily.  09/03/12   [provider]  vitamin E 400 UNIT capsule Take 400 Units by mouth daily.     [provider]  zolpidem (AMBIEN) 10 MG tablet Take 5-10 mg by mouth daily as needed for sleep.     [provider]    Physical Exam: Constitutional: Moderately built and nourished. Vitals:   10/06/19 2015 10/07/19 0057 10/07/19 0217  BP: (!) 164/80 (!) 180/89 Marland Kitchen)  188/88  Pulse: 85 83 80  Resp: 18 16 16   Temp: 98.7 F (37.1 C)  98.4 F (36.9 C)  TempSrc: Oral  Oral  SpO2: 98% 98% 99%  Weight:  74.8 kg   Height:  5\' 3"  (1.6 m)    Eyes: Anicteric no pallor. ENMT: No discharge from the ears eyes nose or mouth. Neck: No mass felt.  No neck rigidity. Respiratory: No rhonchi or crepitations. Cardiovascular: S1-S2 heard. Abdomen: Soft nontender bowel sounds present. Musculoskeletal: No edema. Skin: No rash. Neurologic: Alert awake oriented to time place and person.  Moves all extremities. Psychiatric: Appears normal.  Normal affect.   Labs on Admission: I have personally reviewed following labs and imaging studies  CBC: Recent Labs  Lab 10/06/19 2144  WBC 9.2  HGB 12.8  HCT 37.6  MCV 89.3  PLT 785   Basic Metabolic Panel: Recent Labs  Lab 10/06/19 2144  NA 128*  K 4.3  CL 99  CO2 20*  GLUCOSE 129*  BUN 34*  CREATININE 1.22*  CALCIUM 9.0   GFR: Estimated Creatinine Clearance: 30.9 mL/min (A) (by C-G formula based on SCr of 1.22 mg/dL (H)). Liver Function Tests: No results for input(s): AST, ALT, ALKPHOS, BILITOT, PROT, ALBUMIN in the last 168 hours. No results for input(s): LIPASE, AMYLASE in the last 168 hours. No results for input(s): AMMONIA in the last 168 hours. Coagulation Profile: No results for input(s): INR, PROTIME in the last 168 hours. Cardiac Enzymes: No results for input(s): CKTOTAL, CKMB, CKMBINDEX, TROPONINI in the last 168 hours. BNP (last 3 results) No results for input(s): PROBNP in the  last 8760 hours. HbA1C: No results for input(s): HGBA1C in the last 72 hours. CBG: Recent Labs  Lab 10/07/19 0106  GLUCAP 128*   Lipid Profile: No results for input(s): CHOL, HDL, LDLCALC, TRIG, CHOLHDL, LDLDIRECT in the last 72 hours. Thyroid Function Tests: No results for input(s): TSH, T4TOTAL, FREET4, T3FREE, THYROIDAB in the last 72 hours. Anemia Panel: No results for input(s): VITAMINB12, FOLATE, FERRITIN, TIBC, IRON, RETICCTPCT in the last 72 hours. Urine analysis:    Component Value Date/Time   COLORURINE YELLOW 10/06/2019 2123   APPEARANCEUR HAZY (A) 10/06/2019 2123   LABSPEC 1.011 10/06/2019 2123   LABSPEC 1.010 04/21/2011 1324   PHURINE 5.0 10/06/2019 2123   GLUCOSEU NEGATIVE 10/06/2019 2123   HGBUR SMALL (A) 10/06/2019 2123   BILIRUBINUR NEGATIVE 10/06/2019 2123   BILIRUBINUR Negative 04/21/2011 [place] 10/06/2019 2123   PROTEINUR NEGATIVE 10/06/2019 2123   UROBILINOGEN 0.2 05/18/2011 2014   NITRITE NEGATIVE 10/06/2019 2123   LEUKOCYTESUR LARGE (A) 10/06/2019 2123   LEUKOCYTESUR Moderate 04/21/2011 1324   Sepsis Labs: @LABRCNTIP (procalcitonin:4,lacticidven:4) )No results found for this or any previous visit (from the past 240 hour(s)).   Radiological Exams on Admission: No results found.  EKG: Independently reviewed.  Normal sinus rhythm with nonspecific T wave changes.  Assessment/Plan Principal Problem:   Hyponatremia Active Problems:   Essential hypertension   Primary peritoneal carcinomatosis (HCC)   Acute lower UTI   Diarrhea    1. Hyponatremia could be from dehydration given the patient had some diarrhea.  Patient is receiving 1 L fluid bolus.  Further plans will be based on the studies ordered including serum osmolality urine osmolality urine sodium TSH and cortisol levels and follow metabolic panel closely. 2. Diarrhea will check GI pathogen panel.  Denies taking any recent antibiotics. 3. Possible UTI on  Cipro. 4. Hypertension uncontrolled on nadolol lisinopril and  amlodipine.  I also ordered as needed IV hydralazine. 5. History of hyperlipidemia on fenofibrate. 6. Chronic kidney disease stage III creatinine appears to be at baseline.  Note that patient is on lisinopril. 7. History of ovarian cancer.  COVID-19 test is pending.   DVT prophylaxis: Lovenox. Code Status: Full code. Family Communication: Patient's daughter. Disposition Plan: Back to independent living facility when stable. Consults called: None. Admission status: Observation.   Rise Patience MD [facility] Pager [phone].  If 7PM-7AM, please contact night-coverage [email or web address] Password [facility]  10/07/2019, 3:41 AM

## 2019-10-07 NOTE — Progress Notes (Signed)
PROGRESS NOTE  [name]  DOB: 28-Jun-1929  PCP: Hulan Fess, MD OZH:[id number]  DOA: 10/07/2019  LOS: 0 days   Chief Complaint  Patient presents with  . Abnormal Labs    Brief narrative: [name] is a 86 y.o. female with PMH of HTN, HLD, ovarian cancer in remission, chronic anemia who lives in an independent living facility. Patient was sent to the ED by her PCP on 10/07/2019 for low sodium level.  Few weeks ago, she had a fall, and was placed on a muscle relaxant.  Per patient's daughter, she had [place] food on Sunday 8/22.  From the next day, she started having explosive yellowish loose motion but it was not very frequent.  She continued to have it since then.  She is also getting progressively weak  Blood work done by PCP noted sodium level low at 126 and was hence sent to the ED.    In the ED, patient was afebrile, blood pressure elevated to 188/88. Lab with sodium of 128, creatinine 1.2. Urinalysis showed hazy urine with large amount of leukocytes, few bacteria. EKG showed normal sinus rhythm with nonspecific T wave inversions.  Covid PCR negative Patient was started on ciprofloxacin and was admitted for further evaluation and management.    Subjective: Patient was seen and examined this morning.  He generally Caucasian female.  She was walking back from bathroom with a walker.  She said she had an almost normal bowel movement this morning. Daughter arrived at bedside.  Answered questions to satisfaction. Chart reviewed. No fever, blood pressure normal at 120s this morning Repeat blood work this morning with sodium still low at 129, serum bicarb down to 16, creatinine improved to 0.91.  Assessment/Plan: Hyponatremia  -Likely hypovolemic hyponatremia secondary to diarrhea and dehydration.   -Serum osmolality calculated to be around 275. Pending urine osmolality. -TSH, cortisol level normal.  -Start on IV fluid.  Monitor intake output. -Repeat sodium level in  the morning.  Acute metabolic acidosis CKD 3 -Serum bicarbonate level low at 16 this morning. Creatinine remains at baseline. -Start on sodium bicarbonate drip at 75 mill per hour. -Monitor BMP.  Abnormal urinalysis -Urinalysis showed hazy urine with large amount of leukocytes, few bacteria. -She was started on ciprofloxacin on admission.  No urinary symptoms. -In virtue of her advanced age and potential side effects with ciprofloxacin, I would stop it at this time.  Accelerated hypertension  -Blood pressure was elevated to 180s on admission.   -Home meds include nadolol 20 mg daily, amlodipine 5 mg daily, lisinopril 20 mg daily, -Continue all.  IV hydralazine as needed.  Generalized weakness/falls 3 weeks ago -On Robaxin to 50 mg daily as needed.  Probably contributing to weakness.  No longer on Robaxin. -Patient is able to walk with a walker.  Hyperlipidemia -continue fenofibrate.  History of ovarian cancer -on remission  Mobility: Walking inside the room with a walker Code Status:   Code Status: Full Code  Nutritional status: Body mass index is 29.23 kg/m.     Diet Order            Diet Heart Room service appropriate? Yes; Fluid consistency: Thin  Diet effective now                 DVT prophylaxis:    Antimicrobials:  None Fluid: Sodium bicarbonate 75 mill per hour  Consultants: None Family Communication:  Daughter at bedside  Status is: Observation  The patient will require care spanning >  2 midnights and should be moved to inpatient because: Hemodynamically unstable, Ongoing diagnostic testing needed not appropriate for outpatient work up and IV treatments appropriate due to intensity of illness or inability to take PO  Dispo: The patient is from: Independent living facility              Anticipated d/c is to: Back to independent living facility              Anticipated d/c date is: 2 days              Patient currently is not medically stable to d/c.     Infusions:  . sodium bicarbonate in 1/4 NS 1000 mL infusion 75 mL/hr at 10/07/19 1023    Scheduled Meds: . amLODipine  5 mg Oral Daily  . fenofibrate  160 mg Oral Daily  . lisinopril  20 mg Oral Daily  . nadolol  20 mg Oral Daily  . omega-3 acid ethyl esters  1 g Oral Daily  . pantoprazole  40 mg Oral Daily    Antimicrobials: Anti-infectives (From admission, onward)   Start     Dose/Rate Route Frequency Ordered Stop   10/07/19 1400  ciprofloxacin (CIPRO) IVPB 200 mg  Status:  Discontinued        200 mg 100 mL/hr over 60 Minutes Intravenous Every 12 hours 10/07/19 0502 10/07/19 1112   10/07/19 0130  ciprofloxacin (CIPRO) IVPB 400 mg        400 mg 200 mL/hr over 60 Minutes Intravenous  Once 10/07/19 0125 10/07/19 0406      PRN meds: famotidine, hydrALAZINE, ondansetron **OR** ondansetron (ZOFRAN) IV, zolpidem   Objective: Vitals:   10/07/19 0518 10/07/19 1026  BP: 129/77 (!) 148/81  Pulse: 84 89  Resp: 16 16  Temp: 98.4 F (36.9 C)   SpO2: 99% 97%    Intake/Output Summary (Last 24 hours) at 10/07/2019 1146 Last data filed at 10/07/2019 0553 Gross per 24 hour  Intake 2204.65 ml  Output --  Net 2204.65 ml   Filed Weights   10/07/19 0057  Weight: 74.8 kg   Weight change:  Body mass index is 29.23 kg/m.   Physical Exam: General exam: Appears calm and comfortable.  Not in physical distress Skin: No rashes, lesions or ulcers. HEENT: Atraumatic, normocephalic, supple neck, no obvious bleeding Lungs: Clear to auscultation bilaterally CVS: Regular rate and rhythm, no murmur GI/Abd soft, nontender, nondistended, bowel sound present CNS: Alert, awake monitor x3 Psychiatry: Mood appropriate Extremities: No pedal edema, no calf tenderness  Data Review: I have personally reviewed the laboratory data and studies available.  Recent Labs  Lab 10/06/19 2144 10/07/19 0613  WBC 9.2 8.7  HGB 12.8 12.2  HCT 37.6 36.9  MCV 89.3 90.4  PLT 334 320   Recent Labs  Lab  10/06/19 2144 10/07/19 0613  NA 128* 129*  K 4.3 3.9  CL 99 104  CO2 20* 16*  GLUCOSE 129* 129*  BUN 34* 26*  CREATININE 1.22* 0.91  CALCIUM 9.0 8.2*   No results found for: HGBA1C  No results found for: CHOL, TRIG, HDL, CHOLHDL, VLDL, LDLCALC, LDLDIRECT, LABVLDL  Signed, Terrilee Croak, MD [facility] Pager: [phone] (Secure Chat preferred). 10/07/2019

## 2019-10-08 DIAGNOSIS — Z96653 Presence of artificial knee joint, bilateral: Secondary | ICD-10-CM | POA: Diagnosis present

## 2019-10-08 DIAGNOSIS — E871 Hypo-osmolality and hyponatremia: Secondary | ICD-10-CM | POA: Diagnosis present

## 2019-10-08 DIAGNOSIS — E785 Hyperlipidemia, unspecified: Secondary | ICD-10-CM | POA: Diagnosis present

## 2019-10-08 DIAGNOSIS — N1831 Chronic kidney disease, stage 3a: Secondary | ICD-10-CM | POA: Diagnosis present

## 2019-10-08 DIAGNOSIS — Z8543 Personal history of malignant neoplasm of ovary: Secondary | ICD-10-CM | POA: Diagnosis not present

## 2019-10-08 DIAGNOSIS — Z91013 Allergy to seafood: Secondary | ICD-10-CM | POA: Diagnosis not present

## 2019-10-08 DIAGNOSIS — R197 Diarrhea, unspecified: Secondary | ICD-10-CM | POA: Diagnosis present

## 2019-10-08 DIAGNOSIS — Z90710 Acquired absence of both cervix and uterus: Secondary | ICD-10-CM | POA: Diagnosis not present

## 2019-10-08 DIAGNOSIS — K219 Gastro-esophageal reflux disease without esophagitis: Secondary | ICD-10-CM | POA: Diagnosis present

## 2019-10-08 DIAGNOSIS — M810 Age-related osteoporosis without current pathological fracture: Secondary | ICD-10-CM | POA: Diagnosis present

## 2019-10-08 DIAGNOSIS — Z885 Allergy status to narcotic agent status: Secondary | ICD-10-CM | POA: Diagnosis not present

## 2019-10-08 DIAGNOSIS — Z90722 Acquired absence of ovaries, bilateral: Secondary | ICD-10-CM | POA: Diagnosis not present

## 2019-10-08 DIAGNOSIS — I129 Hypertensive chronic kidney disease with stage 1 through stage 4 chronic kidney disease, or unspecified chronic kidney disease: Secondary | ICD-10-CM | POA: Diagnosis present

## 2019-10-08 DIAGNOSIS — Z20822 Contact with and (suspected) exposure to covid-19: Secondary | ICD-10-CM | POA: Diagnosis present

## 2019-10-08 DIAGNOSIS — D638 Anemia in other chronic diseases classified elsewhere: Secondary | ICD-10-CM | POA: Diagnosis present

## 2019-10-08 DIAGNOSIS — E872 Acidosis: Secondary | ICD-10-CM | POA: Diagnosis present

## 2019-10-08 DIAGNOSIS — R5381 Other malaise: Secondary | ICD-10-CM | POA: Diagnosis present

## 2019-10-08 DIAGNOSIS — C786 Secondary malignant neoplasm of retroperitoneum and peritoneum: Secondary | ICD-10-CM | POA: Diagnosis present

## 2019-10-08 DIAGNOSIS — E861 Hypovolemia: Secondary | ICD-10-CM | POA: Diagnosis present

## 2019-10-08 DIAGNOSIS — Z87891 Personal history of nicotine dependence: Secondary | ICD-10-CM | POA: Diagnosis not present

## 2019-10-08 DIAGNOSIS — Z880 Allergy status to penicillin: Secondary | ICD-10-CM | POA: Diagnosis not present

## 2019-10-08 DIAGNOSIS — E860 Dehydration: Secondary | ICD-10-CM | POA: Diagnosis present

## 2019-10-08 LAB — CBC WITH DIFFERENTIAL/PLATELET
Abs Immature Granulocytes: 0.03 10*3/uL (ref 0.00–0.07)
Basophils Absolute: 0.1 10*3/uL (ref 0.0–0.1)
Basophils Relative: 1 %
Eosinophils Absolute: 0.2 10*3/uL (ref 0.0–0.5)
Eosinophils Relative: 3 %
HCT: 33.7 % — ABNORMAL LOW (ref 36.0–46.0)
Hemoglobin: 11.4 g/dL — ABNORMAL LOW (ref 12.0–15.0)
Immature Granulocytes: 0 %
Lymphocytes Relative: 20 %
Lymphs Abs: 1.3 10*3/uL (ref 0.7–4.0)
MCH: 30.3 pg (ref 26.0–34.0)
MCHC: 33.8 g/dL (ref 30.0–36.0)
MCV: 89.6 fL (ref 80.0–100.0)
Monocytes Absolute: 0.9 10*3/uL (ref 0.1–1.0)
Monocytes Relative: 13 %
Neutro Abs: 4.2 10*3/uL (ref 1.7–7.7)
Neutrophils Relative %: 63 %
Platelets: 292 10*3/uL (ref 150–400)
RBC: 3.76 MIL/uL — ABNORMAL LOW (ref 3.87–5.11)
RDW: 12.8 % (ref 11.5–15.5)
WBC: 6.7 10*3/uL (ref 4.0–10.5)
nRBC: 0 % (ref 0.0–0.2)

## 2019-10-08 LAB — MAGNESIUM: Magnesium: 1.4 mg/dL — ABNORMAL LOW (ref 1.7–2.4)

## 2019-10-08 LAB — BASIC METABOLIC PANEL
Anion gap: 7 (ref 5–15)
BUN: 20 mg/dL (ref 8–23)
CO2: 24 mmol/L (ref 22–32)
Calcium: 8.1 mg/dL — ABNORMAL LOW (ref 8.9–10.3)
Chloride: 98 mmol/L (ref 98–111)
Creatinine, Ser: 1.06 mg/dL — ABNORMAL HIGH (ref 0.44–1.00)
GFR calc Af Amer: 54 mL/min — ABNORMAL LOW (ref >60)
GFR calc non Af Amer: 47 mL/min — ABNORMAL LOW (ref >60)
Glucose, Bld: 98 mg/dL (ref 70–99)
Potassium: 3.6 mmol/L (ref 3.5–5.1)
Sodium: 129 mmol/L — ABNORMAL LOW (ref 135–145)

## 2019-10-08 LAB — NA AND K (SODIUM & POTASSIUM), RAND UR
Potassium Urine: 17 mmol/L
Sodium, Ur: 47 mmol/L

## 2019-10-08 LAB — PHOSPHORUS: Phosphorus: 2 mg/dL — ABNORMAL LOW (ref 2.5–4.6)

## 2019-10-08 LAB — OSMOLALITY, URINE: Osmolality, Ur: 317 mOsm/kg (ref 300–900)

## 2019-10-08 MED ORDER — SODIUM CHLORIDE 0.9 % IV SOLN: INTRAVENOUS | Status: DC

## 2019-10-08 MED ORDER — K PHOS MONO-SOD PHOS DI & MONO 155-852-130 MG PO TABS
250.0000 mg | ORAL_TABLET | Freq: Three times a day (TID) | ORAL | Status: DC
  Administered 2019-10-08 – 2019-10-10 (×7): 250 mg via ORAL
  Filled 2019-10-08 (×8): qty 1

## 2019-10-08 MED ORDER — MAGNESIUM SULFATE 2 GM/50ML IV SOLN
2.0000 g | Freq: Once | INTRAVENOUS | Status: AC
  Administered 2019-10-08: 09:00:00 2 g via INTRAVENOUS
  Filled 2019-10-08: qty 50

## 2019-10-08 MED ORDER — SODIUM CHLORIDE 1 G PO TABS
1.0000 g | ORAL_TABLET | Freq: Three times a day (TID) | ORAL | Status: DC
  Administered 2019-10-08 – 2019-10-10 (×8): 1 g via ORAL
  Filled 2019-10-08 (×9): qty 1

## 2019-10-08 MED ORDER — MAGNESIUM OXIDE 400 (241.3 MG) MG PO TABS
400.0000 mg | ORAL_TABLET | Freq: Two times a day (BID) | ORAL | Status: DC
  Administered 2019-10-08 – 2019-10-10 (×5): 400 mg via ORAL
  Filled 2019-10-08 (×5): qty 1

## 2019-10-08 NOTE — Progress Notes (Signed)
PROGRESS NOTE    [name]  OZH:[id number] DOB: April 26, 1929 DOA: 10/07/2019 PCP: Hulan Fess, MD   Chief Complaint  Patient presents with  . Abnormal Labs  Brief Narrative: 86 year old female with hypertension hyperlipidemia ovarian cancer in remission, chronic anemia from [facility] sent to the ED by PCP 8/27 with low sodium.  Patient has had fall.  [place]algo was placed on muscle relaxant, and per daughter patient also had [place] food on Sunday 8/22 and since next day having explosive yellowish loose motion but was not very frequent and has been progressively getting weak, and seen by PCP and routine labs were obtained that showed sodium of 126. In the ED sodium 138 creatinine 1.2 blood pressure high in 180s UA hazy urine with large amount of leukocytes few bacteria EKG normal sinus rhythm Covid negative patient received ciprofloxacin initially and was admitted.  Subjective: On bedside chair, feels better this am. Sodium slowly coming up but is still low.  Bicarb has improved. No acute events overnight, afebrile Waiting for breakfast  Assessment & Plan:  Hyponatremia: Suspected to be hypovolemic hyponatremia secondary to diarrhea, dehydration.  Work-up with TSH, cortisol normal, patient getting IV fluids sodium bicarb with one fourth normal saline sodium slightly up.  His bicarb improved change fluids to normal saline, will try salt tablets, monitor BMP.  Repeat urine sodium.  Metabolic acidosis/CKD stage IIIa-likely from poor intake/dehydration in the setting of chronic kidney disease bicarb improved and discontinued bicarb drip.  Hypomagnesemia 1.4 repleting with IV and oral magnesium. Hypophosphatemia:will replete w/ k phos  Essential hypertension: Blood pressure accelerated poorly controlled on admission 180s.  Well-controlled currently on home nadolol, amlodipine and lisinopril.  Abnormal urinalysis, without urinary symptoms, was on Cipro briefly but discontinued due to old age and  associated side effects.  Patient is afebrile, no leukocytosis.  Deconditioning/malaise weakness in the setting of hyponatremia, also on Robaxin with recently.  PT OT to continue, advised home health at [facility]  Hyperlipidemia on fenofibrate.  History of ovarian cancer in remission  DVT prophylaxis:  Code Status:   Code Status: Full Code  Family Communication: plan of care discussed with patient at bedside.  Status is: ADMITTED AS Observation  Patient continues to remain hospitalized for ongoing management of hyponatremia for further work-up treatment anticipated stay more than 2 midnights so moving to inpatient status  Dispo: The patient is from: ILF              Anticipated d/c is to: ILF w/ HH              Anticipated d/c date is: 1 day              Patient currently is not medically stable to d/c.  Diet Order            Diet Heart Room service appropriate? Yes; Fluid consistency: Thin  Diet effective now                 Body mass index is 29.23 kg/m. Consultants:see note  Procedures:see note Microbiology:see note Blood Culture    Component Value Date/Time   SDES  09/09/2017 1549    URINE, RANDOM Performed at [facility], [place] [address], [place], [place] [address]    SPECREQUEST  09/09/2017 1549    NONE Performed at [facility], [place] [address], [place], [place] [address]    CULT MULTIPLE SPECIES PRESENT, SUGGEST RECOLLECTION (A) 09/09/2017 1549   REPTSTATUS 09/11/2017 FINAL 09/09/2017 1549  Other culture-see note  Medications: Scheduled Meds: . amLODipine  5 mg Oral Daily  . fenofibrate  160 mg Oral Daily  . lisinopril  20 mg Oral Daily  . magnesium oxide  400 mg Oral BID  . nadolol  20 mg Oral Daily  . omega-3 acid ethyl esters  1 g Oral Daily  . pantoprazole  40 mg Oral Daily  . sodium chloride  1 g Oral TID WC   Continuous Infusions: . sodium chloride    . magnesium sulfate bolus IVPB       Antimicrobials: Anti-infectives (From admission, onward)   Start     Dose/Rate Route Frequency Ordered Stop   10/07/19 1400  ciprofloxacin (CIPRO) IVPB 200 mg  Status:  Discontinued        200 mg 100 mL/hr over 60 Minutes Intravenous Every 12 hours 10/07/19 0502 10/07/19 1112   10/07/19 0130  ciprofloxacin (CIPRO) IVPB 400 mg        400 mg 200 mL/hr over 60 Minutes Intravenous  Once 10/07/19 0125 10/07/19 0406       Objective: Vitals: Today's Vitals   10/07/19 1947 10/07/19 2129 10/08/19 0148 10/08/19 0529  BP:  118/70 (!) 148/91 119/66  Pulse:  74 76 73  Resp:  18 15 15   Temp:  98 F (36.7 C) 98.8 F (37.1 C) 98.4 F (36.9 C)  TempSrc:      SpO2:  96% 97% 96%  Weight:      Height:      PainSc: 0-No pain       Intake/Output Summary (Last 24 hours) at 10/08/2019 0751 Last data filed at 10/08/2019 0600 Gross per 24 hour  Intake 1815.65 ml  Output 1300 ml  Net 515.65 ml   Filed Weights   10/07/19 0057  Weight: 74.8 kg   Weight change:    Intake/Output from previous day: 08/27 0701 - 08/28 0700 In: 1815.7 [P.O.:360; I.V.:1455.7] Out: 1300 [Urine:1300] Intake/Output this shift: No intake/output data recorded.  Examination:  General exam: AAOx3,NAD, weak appearing. HEENT:Oral mucosa DRY, Ear/Nose WNL grossly,dentition normal. Respiratory system: bilaterally clear,no wheezing or crackles,no use of accessory muscle, non tender. Cardiovascular system: S1 & S2 +, regular, No JVD. Gastrointestinal system: Abdomen soft, NT,ND, BS+. Nervous System:Alert, awake, moving extremities and grossly nonfocal Extremities: No edema, distal peripheral pulses palpable.  Skin: No rashes,no icterus. MSK: Normal muscle bulk,tone, power  Data Reviewed: I have personally reviewed following labs and imaging studies CBC: Recent Labs  Lab 10/06/19 2144 10/07/19 0613 10/08/19 0627  WBC 9.2 8.7 6.7  NEUTROABS  --   --  4.2  HGB 12.8 12.2 11.4*  HCT 37.6 36.9 33.7*  MCV 89.3  90.4 89.6  PLT 334 320 202   Basic Metabolic Panel: Recent Labs  Lab 10/06/19 2144 10/07/19 0613 10/08/19 0627  NA 128* 129* 129*  K 4.3 3.9 3.6  CL 99 104 98  CO2 20* 16* 24  GLUCOSE 129* 129* 98  BUN 34* 26* 20  CREATININE 1.22* 0.91 1.06*  CALCIUM 9.0 8.2* 8.1*  MG  --   --  1.4*  PHOS  --   --  2.0*   GFR: Estimated Creatinine Clearance: 35.6 mL/min (A) (by C-G formula based on SCr of 1.06 mg/dL (H)). Liver Function Tests: No results for input(s): AST, ALT, ALKPHOS, BILITOT, PROT, ALBUMIN in the last 168 hours. No results for input(s): LIPASE, AMYLASE in the last 168 hours. No results for input(s): AMMONIA in the last 168 hours. Coagulation Profile:  No results for input(s): INR, PROTIME in the last 168 hours. Cardiac Enzymes: No results for input(s): CKTOTAL, CKMB, CKMBINDEX, TROPONINI in the last 168 hours. BNP (last 3 results) No results for input(s): PROBNP in the last 8760 hours. HbA1C: No results for input(s): HGBA1C in the last 72 hours. CBG: Recent Labs  Lab 10/07/19 0106  GLUCAP 128*   Lipid Profile: No results for input(s): CHOL, HDL, LDLCALC, TRIG, CHOLHDL, LDLDIRECT in the last 72 hours. Thyroid Function Tests: Recent Labs    10/07/19 0613  TSH 1.947   Anemia Panel: No results for input(s): VITAMINB12, FOLATE, FERRITIN, TIBC, IRON, RETICCTPCT in the last 72 hours. Sepsis Labs: No results for input(s): PROCALCITON, LATICACIDVEN in the last 168 hours.  Recent Results (from the past 240 hour(s))  SARS Coronavirus 2 by RT PCR (hospital order, performed in [facility] hospital lab) Nasopharyngeal Nasopharyngeal Swab     Status: None   Collection Time: 10/07/19  1:57 AM   Specimen: Nasopharyngeal Swab  Result Value Ref Range Status   SARS Coronavirus 2 NEGATIVE NEGATIVE Final    Comment: (NOTE) SARS-CoV-2 target nucleic acids are NOT DETECTED.  The SARS-CoV-2 RNA is generally detectable in upper and lower respiratory specimens during the acute  phase of infection. The lowest concentration of SARS-CoV-2 viral copies this assay can detect is 250 copies / mL. A negative result does not preclude SARS-CoV-2 infection and should not be used as the sole basis for treatment or other patient management decisions.  A negative result may occur with improper specimen collection / handling, submission of specimen other than nasopharyngeal swab, presence of viral mutation(s) within the areas targeted by this assay, and inadequate number of viral copies (<250 copies / mL). A negative result must be combined with clinical observations, patient history, and epidemiological information.  Fact Sheet for Patients:   StrictlyIdeas.no  Fact Sheet for Healthcare Providers: BankingDealers.co.za  This test is not yet approved or  cleared by the [place] FDA and has been authorized for detection and/or diagnosis of SARS-CoV-2 by FDA under an Emergency Use Authorization (EUA).  This EUA will remain in effect (meaning this test can be used) for the duration of the COVID-19 declaration under Section 564(b)(1) of the Act, 21 U.S.C. section 360bbb-3(b)(1), unless the authorization is terminated or revoked sooner.  Performed at [facility], [place] [address], [place], [place] [address]       Radiology Studies: No results found.   LOS: 0 days   Antonieta Pert, MD [facility]  10/08/2019, 7:51 AM

## 2019-10-09 LAB — PHOSPHORUS: Phosphorus: 2.6 mg/dL (ref 2.5–4.6)

## 2019-10-09 LAB — BASIC METABOLIC PANEL
Anion gap: 6 (ref 5–15)
BUN: 14 mg/dL (ref 8–23)
CO2: 24 mmol/L (ref 22–32)
Calcium: 7.8 mg/dL — ABNORMAL LOW (ref 8.9–10.3)
Chloride: 101 mmol/L (ref 98–111)
Creatinine, Ser: 0.87 mg/dL (ref 0.44–1.00)
GFR calc Af Amer: >60 mL/min (ref >60)
GFR calc non Af Amer: 59 mL/min — ABNORMAL LOW (ref >60)
Glucose, Bld: 112 mg/dL — ABNORMAL HIGH (ref 70–99)
Potassium: 4 mmol/L (ref 3.5–5.1)
Sodium: 131 mmol/L — ABNORMAL LOW (ref 135–145)

## 2019-10-09 LAB — MAGNESIUM: Magnesium: 1.7 mg/dL (ref 1.7–2.4)

## 2019-10-09 NOTE — Discharge Summary (Signed)
Physician Discharge Summary  [name] WPY:[id number] DOB: 1929/09/23 DOA: 10/07/2019  PCP: Hulan Fess, MD  Admit date: 10/07/2019 Discharge date: 10/10/2019  Admitted From: ILF Disposition:  ILF w/ HH  Recommendations for Outpatient Follow-up:  1. Follow up with PCP in 1-2 weeks 2. Please obtain BMP/CBC in one week 3. Please follow up on the following pending results:  Home Health:YES  Equipment/Devices: NONE  Discharge Condition: Stable Code Status:   Code Status: Full Code Diet recommendation:  Diet Order            Diet general           Diet regular Room service appropriate? Yes; Fluid consistency: Thin  Diet effective now                  Brief/Interim Summary:  86 year old female with hypertension hyperlipidemia ovarian cancer in remission, chronic anemia from ILR sent to the ED by PCP 8/27 with low sodium.  Patient has had fall.  Exalgo was placed on muscle relaxant, and per daughter patient also had [place] food on Sunday 8/22 and since next day having explosive yellowish loose motion but was not very frequent and has been progressively getting weak, and seen by PCP and routine labs were obtained that showed sodium of 126. In the ED sodium 138 creatinine 1.2 blood pressure high in 180s UA hazy urine with large amount of leukocytes few bacteria EKG normal sinus rhythm Covid negative patient received ciprofloxacin initially and was admitted. without urinary symptoms further antibiotics were not continued.  No leukocytosis no fever.  She was treated for hyponatremia likely from volume depletion, sodium improved 131 clinically improved.  No more diarrhea.  She had metabolic acidosis and bicarb has improved.  Other electrolytes with magnesium and phosphorus were repleted. Seen by PT OT and as advised home PT-which was supposed to be initiated 8/28 at ILF  Discharge Diagnoses:  Hyponatremia: resolved with hydration.hypovolemic hyponatremia from volume depletion  diarrhea. Sodium improved to 131, continue salt tablets for few days and repeat BMP in 1 week-discussed with patient and her daughter.  Encourage oral hydration  Metabolic acidosis/CKD stage IIIa-acidosis resolved.  Bicarb is stable.  Renal function is stable.    Hypomagnesemia 1.4  repleted and resolved DC magnesium oxide  Hypophosphatemia: Repleted and resolved, DC potassium phosphate   Essential hypertension: Blood pressure accelerated poorly controlled on admission 180s.  Currently stabilized on home meds nadolol, amlodipine and lisinopril.  Abnormal urinalysis, without urinary symptoms, was on Cipro briefly but discontinued due to old age and associated side effects.  Patient is afebrile, no leukocytosis.  Deconditioning/malaise weakness in the setting of hyponatremia, also on Robaxin with recently.  PT OT to continue, advised home health at ILF  Hyperlipidemia on fenofibrate.  History of ovarian cancer in remission  Subjective: Had regular BM this am, feels well. And feels ready for home/ILF today No nausea vomiting or abd pain.  Discharge Exam: Vitals:   10/09/19 2057 10/10/19 0451  BP: (!) 167/76 (!) 141/73  Pulse: 87 77  Resp: 17 18  Temp: 98.3 F (36.8 C) 98.2 F (36.8 C)  SpO2: 98% 97%   General: Pt is alert, awake, not in acute distress Cardiovascular: RRR, S1/S2 +, no rubs, no gallops Respiratory: CTA bilaterally, no wheezing, no rhonchi Abdominal: Soft, NT, ND, BS+. Extremities: no edema, no cyanosis  Discharge Instructions  Discharge Instructions    Diet general   Complete by: As directed    Discharge instructions   Complete  by: As directed    Check your BMP mag phos level in 5 to 7 days by PCP  Please call call MD or return to ER for similar or worsening recurring problem that brought you to hospital or if any fever,nausea/vomiting,abdominal pain, uncontrolled pain, chest pain,  shortness of breath or any other alarming symptoms.  Please  follow-up your doctor as instructed in a week time and call the office for appointment.  Please avoid alcohol, smoking, or any other illicit substance and maintain healthy habits including taking your regular medications as prescribed.  You were cared for by a hospitalist during your hospital stay. If you have any questions about your discharge medications or the care you received while you were in the hospital after you are discharged, you can call the unit and ask to speak with the hospitalist on call if the hospitalist that took care of you is not available.  Once you are discharged, your primary care physician will handle any further medical issues. Please note that NO REFILLS for any discharge medications will be authorized once you are discharged, as it is imperative that you return to your primary care physician (or establish a relationship with a primary care physician if you do not have one) for your aftercare needs so that they can reassess your need for medications and monitor your lab values   Increase activity slowly   Complete by: As directed      Allergies as of 10/10/2019      Reactions   Morphine And Related Other (See Comments)   Given after knee replacement and code blue occurred   Penicillins Hives, Itching   syncope   Shellfish Allergy Nausea And Vomiting, Other (See Comments)   Pt has shellfish allergy only.  Has had IV contrast x 2 and did fine.      Medication List    TAKE these medications   amLODipine 5 MG tablet Commonly known as: NORVASC Take 5 mg by mouth See admin instructions. At 1500   azelastine 0.1 % nasal spray Commonly known as: ASTELIN Place 1 spray into both nostrils 2 (two) times daily as needed for rhinitis or allergies.   calcium carbonate 750 MG chewable tablet Commonly known as: TUMS EX Chew 1 tablet by mouth daily.   CENTRUM SILVER 50+MEN PO Take 1 tablet by mouth daily   docusate sodium 100 MG capsule Commonly known as: COLACE Take  100 mg by mouth daily as needed for mild constipation.   famotidine 10 MG tablet Commonly known as: PEPCID Take 10 mg by mouth daily as needed for heartburn or indigestion.   fenofibrate 145 MG tablet Commonly known as: TRICOR Take 145 mg by mouth daily. with food   lisinopril 20 MG tablet Commonly known as: ZESTRIL Take 20 mg by mouth daily.   nadolol 20 MG tablet Commonly known as: CORGARD Take 20 mg by mouth daily.   omeprazole 20 MG capsule Commonly known as: PRILOSEC Take 1 capsule (20 mg total) by mouth 2 (two) times daily before a meal.   Restasis 0.05 % ophthalmic emulsion Generic drug: cycloSPORINE Place 1 drop into both eyes in the morning and at bedtime.   sodium chloride 1 g tablet Take 1 tablet (1 g total) by mouth 2 (two) times daily with a meal for 3 days.   Systane 0.4-0.3 % Soln Generic drug: Polyethyl Glycol-Propyl Glycol Apply 1 drop to eye daily as needed.   VITAMIN B COMPLEX PO Take 1 tablet by mouth  daily.   Vitamin D 50 MCG (2000 UT) tablet Take 2,000 Units by mouth daily.   zolpidem 10 MG tablet Commonly known as: AMBIEN Take 5-10 mg by mouth daily as needed for sleep.       Follow-up Information    Little, Lennette Bihari, MD Follow up in 1 week(s).   Specialty: Family Medicine Contact information: [place] [address] [phone]              Allergies  Allergen Reactions  . Morphine And Related Other (See Comments)    Given after knee replacement and code blue occurred  . Penicillins Hives and Itching    syncope  . Shellfish Allergy Nausea And Vomiting and Other (See Comments)    Pt has shellfish allergy only.  Has had IV contrast x 2 and did fine.    The results of significant diagnostics from this hospitalization (including imaging, microbiology, ancillary and laboratory) are listed below for reference.    Microbiology: Recent Results (from the past 240 hour(s))  SARS Coronavirus 2 by RT PCR (hospital  order, performed in [facility] hospital lab) Nasopharyngeal Nasopharyngeal Swab     Status: None   Collection Time: 10/07/19  1:57 AM   Specimen: Nasopharyngeal Swab  Result Value Ref Range Status   SARS Coronavirus 2 NEGATIVE NEGATIVE Final    Comment: (NOTE) SARS-CoV-2 target nucleic acids are NOT DETECTED.  The SARS-CoV-2 RNA is generally detectable in upper and lower respiratory specimens during the acute phase of infection. The lowest concentration of SARS-CoV-2 viral copies this assay can detect is 250 copies / mL. A negative result does not preclude SARS-CoV-2 infection and should not be used as the sole basis for treatment or other patient management decisions.  A negative result may occur with improper specimen collection / handling, submission of specimen other than nasopharyngeal swab, presence of viral mutation(s) within the areas targeted by this assay, and inadequate number of viral copies (<250 copies / mL). A negative result must be combined with clinical observations, patient history, and epidemiological information.  Fact Sheet for Patients:   StrictlyIdeas.no  Fact Sheet for Healthcare Providers: BankingDealers.co.za  This test is not yet approved or  cleared by the [place] FDA and has been authorized for detection and/or diagnosis of SARS-CoV-2 by FDA under an Emergency Use Authorization (EUA).  This EUA will remain in effect (meaning this test can be used) for the duration of the COVID-19 declaration under Section 564(b)(1) of the Act, 21 U.S.C. section 360bbb-3(b)(1), unless the authorization is terminated or revoked sooner.  Performed at [facility], [place] [address] [place], [place] [address]     Procedures/Studies: No results found.  Labs: BNP (last 3 results) No results for input(s): BNP in the last 8760 hours. Basic Metabolic Panel: Recent Labs  Lab 10/06/19 2144  10/07/19 0613 10/08/19 0627 10/09/19 0708  NA 128* 129* 129* 131*  K 4.3 3.9 3.6 4.0  CL 99 104 98 101  CO2 20* 16* 24 24  GLUCOSE 129* 129* 98 112*  BUN 34* 26* 20 14  CREATININE 1.22* 0.91 1.06* 0.87  CALCIUM 9.0 8.2* 8.1* 7.8*  MG  --   --  1.4* 1.7  PHOS  --   --  2.0* 2.6   Liver Function Tests: No results for input(s): AST, ALT, ALKPHOS, BILITOT, PROT, ALBUMIN in the last 168 hours. No results for input(s): LIPASE, AMYLASE in the last 168 hours. No results for input(s): AMMONIA in  the last 168 hours. CBC: Recent Labs  Lab 10/06/19 2144 10/07/19 0613 10/08/19 0627  WBC 9.2 8.7 6.7  NEUTROABS  --   --  4.2  HGB 12.8 12.2 11.4*  HCT 37.6 36.9 33.7*  MCV 89.3 90.4 89.6  PLT 334 320 292   Cardiac Enzymes: No results for input(s): CKTOTAL, CKMB, CKMBINDEX, TROPONINI in the last 168 hours. BNP: Invalid input(s): POCBNP CBG: Recent Labs  Lab 10/07/19 0106  GLUCAP 128*   D-Dimer No results for input(s): DDIMER in the last 72 hours. Hgb A1c No results for input(s): HGBA1C in the last 72 hours. Lipid Profile No results for input(s): CHOL, HDL, LDLCALC, TRIG, CHOLHDL, LDLDIRECT in the last 72 hours. Thyroid function studies No results for input(s): TSH, T4TOTAL, T3FREE, THYROIDAB in the last 72 hours.  Invalid input(s): FREET3 Anemia work up No results for input(s): VITAMINB12, FOLATE, FERRITIN, TIBC, IRON, RETICCTPCT in the last 72 hours. Urinalysis    Component Value Date/Time   COLORURINE YELLOW 10/06/2019 2123   APPEARANCEUR HAZY (A) 10/06/2019 2123   LABSPEC 1.011 10/06/2019 2123   LABSPEC 1.010 04/21/2011 1324   PHURINE 5.0 10/06/2019 2123   GLUCOSEU NEGATIVE 10/06/2019 2123   HGBUR SMALL (A) 10/06/2019 2123   BILIRUBINUR NEGATIVE 10/06/2019 2123   BILIRUBINUR Negative 04/21/2011 [place] 10/06/2019 2123   PROTEINUR NEGATIVE 10/06/2019 2123   UROBILINOGEN 0.2 05/18/2011 2014   NITRITE NEGATIVE 10/06/2019 2123   LEUKOCYTESUR LARGE  (A) 10/06/2019 2123   LEUKOCYTESUR Moderate 04/21/2011 1324   Sepsis Labs Invalid input(s): PROCALCITONIN,  WBC,  LACTICIDVEN Microbiology Recent Results (from the past 240 hour(s))  SARS Coronavirus 2 by RT PCR (hospital order, performed in [place] hospital lab) Nasopharyngeal Nasopharyngeal Swab     Status: None   Collection Time: 10/07/19  1:57 AM   Specimen: Nasopharyngeal Swab  Result Value Ref Range Status   SARS Coronavirus 2 NEGATIVE NEGATIVE Final    Comment: (NOTE) SARS-CoV-2 target nucleic acids are NOT DETECTED.  The SARS-CoV-2 RNA is generally detectable in upper and lower respiratory specimens during the acute phase of infection. The lowest concentration of SARS-CoV-2 viral copies this assay can detect is 250 copies / mL. A negative result does not preclude SARS-CoV-2 infection and should not be used as the sole basis for treatment or other patient management decisions.  A negative result may occur with improper specimen collection / handling, submission of specimen other than nasopharyngeal swab, presence of viral mutation(s) within the areas targeted by this assay, and inadequate number of viral copies (<250 copies / mL). A negative result must be combined with clinical observations, patient history, and epidemiological information.  Fact Sheet for Patients:   StrictlyIdeas.no  Fact Sheet for Healthcare Providers: BankingDealers.co.za  This test is not yet approved or  cleared by the [place] FDA and has been authorized for detection and/or diagnosis of SARS-CoV-2 by FDA under an Emergency Use Authorization (EUA).  This EUA will remain in effect (meaning this test can be used) for the duration of the COVID-19 declaration under Section 564(b)(1) of the Act, 21 U.S.C. section 360bbb-3(b)(1), unless the authorization is terminated or revoked sooner.  Performed at [facility], [place]  [address], [place], [place] [address]      Time coordinating discharge: 25  minutes  SIGNED: Antonieta Pert, MD  [facility] 10/10/2019, 9:45 AM  If 7PM-7AM, please contact night-coverage [email or web address]

## 2019-10-09 NOTE — Progress Notes (Addendum)
PROGRESS NOTE    [name]  YJE:[id number] DOB: 1929-11-14 DOA: 10/07/2019 PCP: Hulan Fess, MD   Chief Complaint  Patient presents with  . Abnormal Labs  Brief Narrative: 86 year old female with hypertension hyperlipidemia ovarian cancer in remission, chronic anemia from ILR sent to the ED by PCP 8/27 with low sodium.  Patient has had fall.  Exalgo was placed on muscle relaxant, and per daughter patient also had [place] food on Sunday 8/22 and since next day having explosive yellowish loose motion but was not very frequent and has been progressively getting weak, and seen by PCP and routine labs were obtained that showed sodium of 126. In the ED sodium 138 creatinine 1.2 blood pressure high in 180s UA hazy urine with large amount of leukocytes few bacteria EKG normal sinus rhythm Covid negative patient received ciprofloxacin initially and was admitted.  Subjective: Sitting in the bedside chair.  Overall feels improved.  Does not feel ready for home today reports her daughter is working on getting arrangement at home as he currently lives alone.    Assessment & Plan:  Hyponatremia:Suspected to be hypovolemic hyponatremia. ? Viral illness- has had diarrhea and weakness recently at home.No diarrhea now. With IV fluid salt tablets improved to 131, stop IV fluids.  encourage oral hydration. She is drinking Gatorade.  Metabolic acidosis/CKD stage III: Volume depletion dehydration.Treated with IV fluids, BUN significantly high in 30s on admission now improved to 14 with IV fluids. Bicarb at 24.  Hypomagnesemia/hypophosphatemia continue repletion oral magnesium oxide and k-Phos.  Check mag and Phos today.  Abnormal urinalysis,without urinary symptoms, was on Cipro briefly but discontinued due to old age and associated side effects.Patient is afebrile, no leukocytosis.  Deconditioning/malaise weakness in the setting of hyponatremia, also on Robaxin with recently.  Off Robaxin, continue PT  OT will need home health at ALF.   Hyperlipidemia: Cont fibrates.  History of ovarian cancer in remission.  DVT prophylaxis:  Code Status:   Code Status: Full Code  Family Communication: Plan of care discussed with patient at bedside. I called and updated her daughter  Status is: ADMITTED AS Observation.  Patient continues to remain hospitalized for ongoing management of her hyponatremia, to ensure she is able to tolerate and hold oral hydration well.Unsafe disposition plan.Daughter would like [place] upon d/c and daughter working on arranging help few hrs a day.  Dispo: The patient is from: ILF              Anticipated d/c is to: ILF w/ HH PT. consult TOC to arrange home health PT, aid              Anticipated d/c date is: Architectural technologist .              Patient currently is medically stable for discharge. Diet Order            Diet regular Room service appropriate? Yes; Fluid consistency: Thin  Diet effective now                 Body mass index is 29.23 kg/m. Consultants:see note  Procedures:see note Microbiology:see note Blood Culture    Component Value Date/Time   SDES  09/09/2017 1549    URINE, RANDOM Performed at [facility], [place] [address] [place], [place] [address]    SPECREQUEST  09/09/2017 1549    NONE Performed at [facility], [place] [address] [place],  [address]    CULT MULTIPLE SPECIES PRESENT, SUGGEST  RECOLLECTION (A) 09/09/2017 1549   REPTSTATUS 09/11/2017 FINAL 09/09/2017 1549    Other culture-see note  Medications: Scheduled Meds: . amLODipine  5 mg Oral Daily  . fenofibrate  160 mg Oral Daily  . lisinopril  20 mg Oral Daily  . magnesium oxide  400 mg Oral BID  . nadolol  20 mg Oral Daily  . omega-3 acid ethyl esters  1 g Oral Daily  . pantoprazole  40 mg Oral Daily  . phosphorus  250 mg Oral TID  . sodium chloride  1 g Oral TID WC   Continuous Infusions:   Antimicrobials: Anti-infectives (From  admission, onward)   Start     Dose/Rate Route Frequency Ordered Stop   10/07/19 1400  ciprofloxacin (CIPRO) IVPB 200 mg  Status:  Discontinued        200 mg 100 mL/hr over 60 Minutes Intravenous Every 12 hours 10/07/19 0502 10/07/19 1112   10/07/19 0130  ciprofloxacin (CIPRO) IVPB 400 mg        400 mg 200 mL/hr over 60 Minutes Intravenous  Once 10/07/19 0125 10/07/19 0406       Objective: Vitals: Today's Vitals   10/08/19 2020 10/08/19 2204 10/09/19 0534 10/09/19 0851  BP:  (!) 125/59 (!) 146/71   Pulse:  72 72   Resp:  15 16   Temp:  98 F (36.7 C) 98.1 F (36.7 C)   TempSrc:      SpO2:  96% 96%   Weight:      Height:      PainSc: 0-No pain   0-No pain    Intake/Output Summary (Last 24 hours) at 10/09/2019 1252 Last data filed at 10/09/2019 1100 Gross per 24 hour  Intake 2055.25 ml  Output 1900 ml  Net 155.25 ml   Filed Weights   10/07/19 0057  Weight: 74.8 kg   Weight change:    Intake/Output from previous day: 08/28 0701 - 08/29 0700 In: 1935.3 [P.O.:240; I.V.:1695.3] Out: 1550 [Urine:1550] Intake/Output this shift: Total I/O In: 120 [P.O.:120] Out: 450 [Urine:450]  Examination:  General exam: AAOx3 , NAD, weak appearing. HEENT:Oral mucosa moist, Ear/Nose WNL grossly, dentition normal. Respiratory system: bilaterally clear,no wheezing or crackles,no use of accessory muscle Cardiovascular system: S1 & S2 +, No JVD,. Gastrointestinal system: Abdomen soft, NT,ND, BS+ Nervous System:Alert, awake, moving extremities and grossly nonfocal Extremities: No edema, distal peripheral pulses palpable.  Skin: No rashes,no icterus. MSK: Normal muscle bulk,tone, power  Data Reviewed: I have personally reviewed following labs and imaging studies CBC: Recent Labs  Lab 10/06/19 2144 10/07/19 0613 10/08/19 0627  WBC 9.2 8.7 6.7  NEUTROABS  --   --  4.2  HGB 12.8 12.2 11.4*  HCT 37.6 36.9 33.7*  MCV 89.3 90.4 89.6  PLT 334 320 016   Basic Metabolic  Panel: Recent Labs  Lab 10/06/19 2144 10/07/19 0613 10/08/19 0627 10/09/19 0708  NA 128* 129* 129* 131*  K 4.3 3.9 3.6 4.0  CL 99 104 98 101  CO2 20* 16* 24 24  GLUCOSE 129* 129* 98 112*  BUN 34* 26* 20 14  CREATININE 1.22* 0.91 1.06* 0.87  CALCIUM 9.0 8.2* 8.1* 7.8*  MG  --   --  1.4*  --   PHOS  --   --  2.0*  --    GFR: Estimated Creatinine Clearance: 43.3 mL/min (by C-G formula based on SCr of 0.87 mg/dL). Liver Function Tests: No results for input(s): AST, ALT, ALKPHOS, BILITOT, PROT, ALBUMIN in the last  168 hours. No results for input(s): LIPASE, AMYLASE in the last 168 hours. No results for input(s): AMMONIA in the last 168 hours. Coagulation Profile: No results for input(s): INR, PROTIME in the last 168 hours. Cardiac Enzymes: No results for input(s): CKTOTAL, CKMB, CKMBINDEX, TROPONINI in the last 168 hours. BNP (last 3 results) No results for input(s): PROBNP in the last 8760 hours. HbA1C: No results for input(s): HGBA1C in the last 72 hours. CBG: Recent Labs  Lab 10/07/19 0106  GLUCAP 128*   Lipid Profile: No results for input(s): CHOL, HDL, LDLCALC, TRIG, CHOLHDL, LDLDIRECT in the last 72 hours. Thyroid Function Tests: Recent Labs    10/07/19 0613  TSH 1.947   Anemia Panel: No results for input(s): VITAMINB12, FOLATE, FERRITIN, TIBC, IRON, RETICCTPCT in the last 72 hours. Sepsis Labs: No results for input(s): PROCALCITON, LATICACIDVEN in the last 168 hours.  Recent Results (from the past 240 hour(s))  SARS Coronavirus 2 by RT PCR (hospital order, performed in [facility] hospital lab) Nasopharyngeal Nasopharyngeal Swab     Status: None   Collection Time: 10/07/19  1:57 AM   Specimen: Nasopharyngeal Swab  Result Value Ref Range Status   SARS Coronavirus 2 NEGATIVE NEGATIVE Final    Comment: (NOTE) SARS-CoV-2 target nucleic acids are NOT DETECTED.  The SARS-CoV-2 RNA is generally detectable in upper and lower respiratory specimens during the  acute phase of infection. The lowest concentration of SARS-CoV-2 viral copies this assay can detect is 250 copies / mL. A negative result does not preclude SARS-CoV-2 infection and should not be used as the sole basis for treatment or other patient management decisions.  A negative result may occur with improper specimen collection / handling, submission of specimen other than nasopharyngeal swab, presence of viral mutation(s) within the areas targeted by this assay, and inadequate number of viral copies (<250 copies / mL). A negative result must be combined with clinical observations, patient history, and epidemiological information.  Fact Sheet for Patients:   StrictlyIdeas.no  Fact Sheet for Healthcare Providers: BankingDealers.co.za  This test is not yet approved or  cleared by the [place] FDA and has been authorized for detection and/or diagnosis of SARS-CoV-2 by FDA under an Emergency Use Authorization (EUA).  This EUA will remain in effect (meaning this test can be used) for the duration of the COVID-19 declaration under Section 564(b)(1) of the Act, 21 U.S.C. section 360bbb-3(b)(1), unless the authorization is terminated or revoked sooner.  Performed at [facility], [place] [address], [place],  [address]       Radiology Studies: No results found.   LOS: 1 day   Antonieta Pert, MD [facility]  10/09/2019, 12:52 PM

## 2019-10-10 LAB — GASTROINTESTINAL PANEL BY PCR, STOOL (REPLACES STOOL CULTURE)

## 2019-10-10 MED ORDER — OMEPRAZOLE 20 MG PO CPDR: 20.0000 mg | DELAYED_RELEASE_CAPSULE | Freq: Two times a day (BID) | ORAL | Status: DC

## 2019-10-10 MED ORDER — SODIUM CHLORIDE 1 G PO TABS: 1.0000 g | ORAL_TABLET | Freq: Two times a day (BID) | ORAL | 0 refills | Status: AC

## 2019-10-10 NOTE — TOC Transition Note (Addendum)
Transition of Care [facility]) - CM/SW Discharge Note   Patient Details  Name: [name] MRN: [id number] Date of Birth: [birth year]  [facility] CM/SW Contact:  Lia Hopping, [place] Phone Number: 10/10/2019, 10:35 AM   Clinical Narrative:    Patient resides at [facility]. CSW notified [facility] staff Rollene Fare, the patient will return today with recommendation for PT/OT follow up. CSW faxed Home Health orders to [phone].  CSW reached out to the patient daughter to notify her, left voicemail (2x).    Final next level of care: Other (comment) ([place]) Barriers to Discharge: No Barriers Identified   Patient Goals and CMS Choice Patient states their goals for this hospitalization and ongoing recovery are:: Return to [place]   Choice offered to / list presented to : NA  Discharge Placement                       Discharge Plan and Services                DME Arranged: N/A DME Agency: NA       HH Arranged: PT, OT [place] Agency: Other - See comment Secondary school teacher) Date HH Agency Contacted: 10/10/19 Time [place]: 1035 Representative spoke with at [place]: [place] ([place]) Interventions     Readmission Risk Interventions No flowsheet data found.

## 2019-10-10 NOTE — Progress Notes (Signed)
D/C instructions given to patient. Patient had no questions. NT or writer will wheel patient out once her family comes to pick her up

## 2019-10-11 DIAGNOSIS — M545 Low back pain: Secondary | ICD-10-CM | POA: Diagnosis not present

## 2019-10-11 DIAGNOSIS — M6281 Muscle weakness (generalized): Secondary | ICD-10-CM | POA: Diagnosis not present

## 2019-10-11 DIAGNOSIS — R2681 Unsteadiness on feet: Secondary | ICD-10-CM | POA: Diagnosis not present

## 2019-10-11 DIAGNOSIS — Z9181 History of falling: Secondary | ICD-10-CM | POA: Diagnosis not present

## 2019-10-13 DIAGNOSIS — L989 Disorder of the skin and subcutaneous tissue, unspecified: Secondary | ICD-10-CM | POA: Diagnosis not present

## 2019-10-13 DIAGNOSIS — R32 Unspecified urinary incontinence: Secondary | ICD-10-CM | POA: Diagnosis not present

## 2019-10-13 DIAGNOSIS — R300 Dysuria: Secondary | ICD-10-CM | POA: Diagnosis not present

## 2019-10-13 DIAGNOSIS — E871 Hypo-osmolality and hyponatremia: Secondary | ICD-10-CM | POA: Diagnosis not present

## 2019-10-13 DIAGNOSIS — N189 Chronic kidney disease, unspecified: Secondary | ICD-10-CM | POA: Diagnosis not present

## 2019-10-20 DIAGNOSIS — E871 Hypo-osmolality and hyponatremia: Secondary | ICD-10-CM | POA: Diagnosis not present

## 2019-10-20 DIAGNOSIS — Z87440 Personal history of urinary (tract) infections: Secondary | ICD-10-CM | POA: Diagnosis not present

## 2019-10-20 DIAGNOSIS — R21 Rash and other nonspecific skin eruption: Secondary | ICD-10-CM | POA: Diagnosis not present

## 2019-10-20 DIAGNOSIS — D649 Anemia, unspecified: Secondary | ICD-10-CM | POA: Diagnosis not present

## 2019-10-25 DIAGNOSIS — R2681 Unsteadiness on feet: Secondary | ICD-10-CM | POA: Diagnosis not present

## 2019-10-25 DIAGNOSIS — M542 Cervicalgia: Secondary | ICD-10-CM | POA: Diagnosis not present

## 2019-10-25 DIAGNOSIS — I10 Essential (primary) hypertension: Secondary | ICD-10-CM | POA: Diagnosis not present

## 2019-10-25 DIAGNOSIS — M6281 Muscle weakness (generalized): Secondary | ICD-10-CM | POA: Diagnosis not present

## 2019-10-28 DIAGNOSIS — M6281 Muscle weakness (generalized): Secondary | ICD-10-CM | POA: Diagnosis not present

## 2019-10-28 DIAGNOSIS — R2681 Unsteadiness on feet: Secondary | ICD-10-CM | POA: Diagnosis not present

## 2019-10-28 DIAGNOSIS — M542 Cervicalgia: Secondary | ICD-10-CM | POA: Diagnosis not present

## 2019-11-01 DIAGNOSIS — Z23 Encounter for immunization: Secondary | ICD-10-CM | POA: Diagnosis not present

## 2019-11-02 DIAGNOSIS — R2681 Unsteadiness on feet: Secondary | ICD-10-CM | POA: Diagnosis not present

## 2019-11-02 DIAGNOSIS — M542 Cervicalgia: Secondary | ICD-10-CM | POA: Diagnosis not present

## 2019-11-02 DIAGNOSIS — M6281 Muscle weakness (generalized): Secondary | ICD-10-CM | POA: Diagnosis not present

## 2019-11-03 DIAGNOSIS — M6281 Muscle weakness (generalized): Secondary | ICD-10-CM | POA: Diagnosis not present

## 2019-11-03 DIAGNOSIS — M542 Cervicalgia: Secondary | ICD-10-CM | POA: Diagnosis not present

## 2019-11-03 DIAGNOSIS — R2681 Unsteadiness on feet: Secondary | ICD-10-CM | POA: Diagnosis not present

## 2019-11-08 DIAGNOSIS — R2681 Unsteadiness on feet: Secondary | ICD-10-CM | POA: Diagnosis not present

## 2019-11-08 DIAGNOSIS — M6281 Muscle weakness (generalized): Secondary | ICD-10-CM | POA: Diagnosis not present

## 2019-11-08 DIAGNOSIS — M542 Cervicalgia: Secondary | ICD-10-CM | POA: Diagnosis not present

## 2019-11-10 DIAGNOSIS — R2681 Unsteadiness on feet: Secondary | ICD-10-CM | POA: Diagnosis not present

## 2019-11-10 DIAGNOSIS — M6281 Muscle weakness (generalized): Secondary | ICD-10-CM | POA: Diagnosis not present

## 2019-11-10 DIAGNOSIS — M542 Cervicalgia: Secondary | ICD-10-CM | POA: Diagnosis not present

## 2019-11-15 DIAGNOSIS — M6281 Muscle weakness (generalized): Secondary | ICD-10-CM | POA: Diagnosis not present

## 2019-11-15 DIAGNOSIS — R2681 Unsteadiness on feet: Secondary | ICD-10-CM | POA: Diagnosis not present

## 2019-11-15 DIAGNOSIS — M542 Cervicalgia: Secondary | ICD-10-CM | POA: Diagnosis not present

## 2019-11-17 DIAGNOSIS — L820 Inflamed seborrheic keratosis: Secondary | ICD-10-CM | POA: Diagnosis not present

## 2019-11-17 DIAGNOSIS — L814 Other melanin hyperpigmentation: Secondary | ICD-10-CM | POA: Diagnosis not present

## 2019-11-17 DIAGNOSIS — L570 Actinic keratosis: Secondary | ICD-10-CM | POA: Diagnosis not present

## 2019-11-21 DIAGNOSIS — D649 Anemia, unspecified: Secondary | ICD-10-CM | POA: Diagnosis not present

## 2019-11-21 DIAGNOSIS — R21 Rash and other nonspecific skin eruption: Secondary | ICD-10-CM | POA: Diagnosis not present

## 2019-11-21 DIAGNOSIS — Z87440 Personal history of urinary (tract) infections: Secondary | ICD-10-CM | POA: Diagnosis not present

## 2019-11-21 DIAGNOSIS — E871 Hypo-osmolality and hyponatremia: Secondary | ICD-10-CM | POA: Diagnosis not present

## 2019-12-15 DIAGNOSIS — L814 Other melanin hyperpigmentation: Secondary | ICD-10-CM | POA: Diagnosis not present

## 2019-12-15 DIAGNOSIS — L821 Other seborrheic keratosis: Secondary | ICD-10-CM | POA: Diagnosis not present

## 2019-12-15 DIAGNOSIS — L570 Actinic keratosis: Secondary | ICD-10-CM | POA: Diagnosis not present

## 2019-12-26 DIAGNOSIS — Z23 Encounter for immunization: Secondary | ICD-10-CM | POA: Diagnosis not present

## 2019-12-26 NOTE — Progress Notes (Deleted)
Cardiology Office Note:    Date:  12/26/2019   ID:  [name], DOB [birth year], MRN [id number]  PCP:  Hulan Fess, MD  Cardiologist:  No primary care provider on file.  Electrophysiologist:  None   Referring MD: Hulan Fess, MD   No chief complaint on file.   History of Present Illness:    [name] is a 86 y.o. female with a hx of ovarian cancer, CKD, hyperlipidemia, hypertension, obesity who presents for follow-up.  She was referred by Dr. Rex Kras for evaluation of heart murmur, initially seen on 12/30/2018.  Echocardiogram on 01/04/2019 showed LVEF 60 to 46%, grade 1 diastolic dysfunction, normal RV function, mild aortic stenosis.  Since last clinic visit,  She reports that she has been told for years she has a heart murmur.  [facility] community.  She walks with a walker, will go on walks up to 30 minutes/day.  She denies any exertional chest pain.  Does report that she gets some shortness of breath with exertion.  Denies any lower extremity edema.  Does not report regular palpitations, but does report occasional feeling that her heart is skipping a beat.  This lasts for a second and resolves.    Past Medical History:  Diagnosis Date  . Anemia    hx of   . Anemia associated with acute blood loss 04/03/2011  . Arthritis    knees   . ARTHRITIS, SEPTIC 05/28/2009   Qualifier: Diagnosis of  By: Tommy Medal MD, Roderic Scarce    . Ascites   . Blood transfusion    hx of 2011   . Complication of anesthesia    Sodium drops per pt   . Cystitis   . DIARRHEA, ANTIBIOTIC ASSOCIATED 05/31/2009   Qualifier: Diagnosis of  By: Tommy Medal MD, Roderic Scarce    . GERD (gastroesophageal reflux disease)   . H/O hiatal hernia   . Hyperlipidemia   . Hypertension   . Hyponatremia   . Neutropenia with fever ([place]) 05/18/2011  . OSTEOMYELITIS, CHRONIC, LOWER LEG 04/23/2009   Qualifier: Diagnosis of  By: Johnnye Sima MD, Dellis Filbert    . Osteopenia   . OSTEOPOROSIS 04/23/2009   Qualifier:  Diagnosis of  By: Johnnye Sima MD, Dellis Filbert    . Ovarian cancer ([place]) 01/25/2011  . Pneumonia    hx of   . Postmenopausal atrophic vaginitis   . PROSTHETIC JOINT COMPLICATION 5/68/1275   Qualifier: Diagnosis of  By: Tommy Medal MD, Roderic Scarce    . Renal disorder    Decreased kidney function  . Staph infection 2010   after knee replacement  . Urinary frequency   . Vulvitis     Past Surgical History:  Procedure Laterality Date  . ABDOMINAL HYSTERECTOMY  04/01/2011   Procedure: HYSTERECTOMY ABDOMINAL;  Surgeon: Alvino Chapel, MD;  Location: [facility];  Service: Gynecology;  Laterality: N/A;  . APPENDECTOMY    . JOINT REPLACEMENT     R knee in 2008, 5 operations on L knee  . LAPAROTOMY  04/01/2011   Procedure: EXPLORATORY LAPAROTOMY;  Surgeon: Alvino Chapel, MD;  Location: [facility];  Service: Gynecology;  Laterality: N/A;  . OTHER SURGICAL HISTORY     hx of C section 1966  . SALPINGOOPHORECTOMY  04/01/2011   Procedure: SALPINGO OOPHERECTOMY;  Surgeon: Alvino Chapel, MD;  Location: [facility];  Service: Gynecology;  Laterality: Bilateral;  . TONSILLECTOMY      Current Medications: No outpatient medications have been marked as taking for the 12/27/19  encounter (Appointment) with Donato Heinz, MD.     Allergies:   Morphine and related, Penicillins, and Shellfish allergy   Social History   Socioeconomic History  . Marital status: Widowed    Spouse name: Not on file  . Number of children: Not on file  . Years of education: Not on file  . Highest education level: Not on file  Occupational History  . Not on file  Tobacco Use  . Smoking status: Former Smoker    Quit date: 02/11/1952    Years since quitting: 67.9  . Smokeless tobacco: Never Used  Vaping Use  . Vaping Use: Never used  Substance and Sexual Activity  . Alcohol use: Not Currently  . Drug use: Never  . Sexual activity: Never  Other Topics Concern  . Not on file  Social History Narrative  .  Not on file   Social Determinants of Health   Financial Resource Strain:   . Difficulty of Paying Living Expenses: Not on file  Food Insecurity:   . Worried About Charity fundraiser in the Last Year: Not on file  . Ran Out of Food in the Last Year: Not on file  Transportation Needs:   . Lack of Transportation (Medical): Not on file  . Lack of Transportation (Non-Medical): Not on file  Physical Activity:   . Days of Exercise per Week: Not on file  . Minutes of Exercise per Session: Not on file  Stress:   . Feeling of Stress : Not on file  Social Connections:   . Frequency of Communication with Friends and Family: Not on file  . Frequency of Social Gatherings with Friends and Family: Not on file  . Attends Religious Services: Not on file  . Active Member of Clubs or Organizations: Not on file  . Attends Archivist Meetings: Not on file  . Marital Status: Not on file     Family History: The patient's family history includes Cancer in an other family member; Diabetes in her brother; Heart attack in her brother.  ROS:   Please see the history of present illness.    All other systems reviewed and are negative.  EKGs/Labs/Other Studies Reviewed:    The following studies were reviewed today:   EKG:  EKG is ordered today.  The ekg ordered today demonstrates normal sinus rhythm, rate 73, low voltage in precordial leads, no ST/T abnormalities  Recent Labs: 08/04/2019: ALT 23 10/07/2019: TSH 1.947 10/08/2019: Hemoglobin 11.4; Platelets 292 10/09/2019: BUN 14; Creatinine, Ser 0.87; Magnesium 1.7; Potassium 4.0; Sodium 131  Recent Lipid Panel No results found for: CHOL, TRIG, HDL, CHOLHDL, VLDL, LDLCALC, LDLDIRECT  Physical Exam:    VS:  There were no vitals taken for this visit.    Wt Readings from Last 3 Encounters:  10/07/19 165 lb (74.8 kg)  08/08/19 172 lb (78 kg)  08/05/19 172 lb (78 kg)     GEN: d in no acute distress HEENT: Normal NECK: No JVD LYMPHATICS:  No lymphadenopathy CARDIAC: RRR, 2/6 systolic murmur RESPIRATORY:  Clear to auscultation without rales, wheezing or rhonchi  ABDOMEN: Soft, non-tender, non-distended MUSCULOSKELETAL: Trace edema SKIN: Warm and dry NEUROLOGIC:  Alert and oriented x 3 PSYCHIATRIC:  Normal affect   ASSESSMENT:    No diagnosis found. PLAN:    In order of problems listed above:  Aortic stenosis: Mild on echo 12/2018 (MG 13 mmHg, AVA 1.6 cm).  Will monitor with repeat echo next year  Hypertension: On nadolol  20 mg daily, amlodipine 5 mg daily, lisinopril 20 mg daily.   RTC in 1 year  Medication Adjustments/Labs and Tests Ordered: Current medicines are reviewed at length with the patient today.  Concerns regarding medicines are outlined above.  No orders of the defined types were placed in this encounter.  No orders of the defined types were placed in this encounter.   There are no Patient Instructions on file for this visit.   Signed, Donato Heinz, MD  12/26/2019 5:09 PM    [place]

## 2019-12-27 ENCOUNTER — Ambulatory Visit: Payer: Medicare Other | Admitting: Cardiology

## 2020-01-12 DIAGNOSIS — L814 Other melanin hyperpigmentation: Secondary | ICD-10-CM | POA: Diagnosis not present

## 2020-01-12 DIAGNOSIS — L821 Other seborrheic keratosis: Secondary | ICD-10-CM | POA: Diagnosis not present

## 2020-01-12 DIAGNOSIS — L570 Actinic keratosis: Secondary | ICD-10-CM | POA: Diagnosis not present

## 2020-01-17 ENCOUNTER — Ambulatory Visit (INDEPENDENT_AMBULATORY_CARE_PROVIDER_SITE_OTHER): Payer: Medicare Other | Admitting: Cardiology

## 2020-01-17 ENCOUNTER — Other Ambulatory Visit: Payer: Self-pay

## 2020-01-17 ENCOUNTER — Encounter: Payer: Self-pay | Admitting: Cardiology

## 2020-01-17 VITALS — BP 152/78 | HR 78 | Ht 63.0 in | Wt 173.2 lb

## 2020-01-17 DIAGNOSIS — I10 Essential (primary) hypertension: Secondary | ICD-10-CM

## 2020-01-17 DIAGNOSIS — I350 Nonrheumatic aortic (valve) stenosis: Secondary | ICD-10-CM

## 2020-01-17 MED ORDER — AMLODIPINE BESYLATE 10 MG PO TABS
10.0000 mg | ORAL_TABLET | Freq: Every day | ORAL | 3 refills | Status: DC
Start: 2020-01-17 — End: 2020-04-11

## 2020-01-17 NOTE — Progress Notes (Signed)
Cardiology Office Note:    Date:  01/17/2020   ID:  [name], DOB 15-Aug-1929, MRN [id number]  PCP:  Jennifer Fess, MD  Cardiologist:  No primary care provider on file.  Electrophysiologist:  None   Referring MD: Jennifer Fess, MD   Chief Complaint  Patient presents with  . Aortic Stenosis    History of Present Illness:    [name] is a 86 y.o. female with a hx of ovarian cancer, CKD, hyperlipidemia, hypertension, obesity who presents for follow-up.  She was referred by Dr. Rex [name] for evaluation of heart murmur, initially seen on 12/30/2018.  Echocardiogram on 01/04/2019 showed LVEF 60 to 93%, grade 1 diastolic dysfunction, normal RV function, mild aortic stenosis.  Since last clinic visit, she reports has been doing well.  Denies any chest pain, dyspnea, lightheadedness, syncope, lower extremity edema.  Reports rare palpitations, has been lasting for 1 second or so.  Has not been checking BP.    Past Medical History:  Diagnosis Date  . Anemia    hx of   . Anemia associated with acute blood loss 04/03/2011  . Arthritis    knees   . ARTHRITIS, SEPTIC 05/28/2009   Qualifier: Diagnosis of  By: Jennifer Medal MD, [name]    . Ascites   . Blood transfusion    hx of 2011   . Complication of anesthesia    Sodium drops per pt   . Cystitis   . DIARRHEA, ANTIBIOTIC ASSOCIATED 05/31/2009   Qualifier: Diagnosis of  By: Jennifer Medal MD, [name]    . GERD (gastroesophageal reflux disease)   . H/O hiatal hernia   . Hyperlipidemia   . Hypertension   . Hyponatremia   . Neutropenia with fever ([place]) 05/18/2011  . OSTEOMYELITIS, CHRONIC, LOWER LEG 04/23/2009   Qualifier: Diagnosis of  By: Jennifer Sima MD, Dellis Filbert    . Osteopenia   . OSTEOPOROSIS 04/23/2009   Qualifier: Diagnosis of  By: Jennifer Sima MD, Dellis Filbert    . Ovarian cancer ([place]) 01/25/2011  . Pneumonia    hx of   . Postmenopausal atrophic vaginitis   . PROSTHETIC JOINT COMPLICATION 2/67/1245   Qualifier: Diagnosis of  By: Jennifer Medal  MD, [name]    . Renal disorder    Decreased kidney function  . Staph infection 2010   after knee replacement  . Urinary frequency   . Vulvitis     Past Surgical History:  Procedure Laterality Date  . ABDOMINAL HYSTERECTOMY  04/01/2011   Procedure: HYSTERECTOMY ABDOMINAL;  Surgeon: Jennifer Chapel, MD;  Location: [facility];  Service: Gynecology;  Laterality: N/A;  . APPENDECTOMY    . JOINT REPLACEMENT     R knee in 2008, 5 operations on L knee  . LAPAROTOMY  04/01/2011   Procedure: EXPLORATORY LAPAROTOMY;  Surgeon: Jennifer Chapel, MD;  Location: [facility];  Service: Gynecology;  Laterality: N/A;  . OTHER SURGICAL HISTORY     hx of C section 1966  . SALPINGOOPHORECTOMY  04/01/2011   Procedure: SALPINGO OOPHERECTOMY;  Surgeon: Jennifer Chapel, MD;  Location: [facility];  Service: Gynecology;  Laterality: Bilateral;  . TONSILLECTOMY      Current Medications: Current Meds  Medication Sig  . amLODipine (NORVASC) 10 MG tablet Take 1 tablet (10 mg total) by mouth daily. At 1500  . azelastine (ASTELIN) 0.1 % nasal spray Place 1 spray into both nostrils 2 (two) times daily as needed for rhinitis or allergies.   . B Complex Vitamins (VITAMIN B  COMPLEX PO) Take 1 tablet by mouth daily.   . calcium carbonate (TUMS EX) 750 MG chewable tablet Chew 1 tablet by mouth daily.  . Cholecalciferol (VITAMIN D) 2000 UNITS tablet Take 2,000 Units by mouth daily.   Marland Kitchen docusate sodium (COLACE) 100 MG capsule Take 100 mg by mouth daily as needed for mild constipation.   . famotidine (PEPCID) 10 MG tablet Take 10 mg by mouth daily as needed for heartburn or indigestion.  . fenofibrate (TRICOR) 145 MG tablet Take 145 mg by mouth daily. with food  . lisinopril (ZESTRIL) 20 MG tablet Take 20 mg by mouth daily.  . Multiple Vitamins-Minerals (CENTRUM SILVER 50+MEN PO) Take 1 tablet by mouth daily  . nadolol (CORGARD) 20 MG tablet Take 20 mg by mouth daily.   Marland Kitchen omeprazole (PRILOSEC) 20 MG capsule  Take 1 capsule (20 mg total) by mouth 2 (two) times daily before a meal.  . Polyethyl Glycol-Propyl Glycol (SYSTANE) 0.4-0.3 % SOLN Apply 1 drop to eye daily as needed.   . RESTASIS 0.05 % ophthalmic emulsion Place 1 drop into both eyes in the morning and at bedtime.  Marland Kitchen zolpidem (AMBIEN) 10 MG tablet Take 5-10 mg by mouth daily as needed for sleep.   . [DISCONTINUED] amLODipine (NORVASC) 5 MG tablet Take 5 mg by mouth See admin instructions. At 1500     Allergies:   Morphine and related, Penicillins, and Shellfish allergy   Social History   Socioeconomic History  . Marital status: Widowed    Spouse name: Not on file  . Number of children: Not on file  . Years of education: Not on file  . Highest education level: Not on file  Occupational History  . Not on file  Tobacco Use  . Smoking status: Former Smoker    Quit date: 02/11/1952    Years since quitting: 67.9  . Smokeless tobacco: Never Used  Vaping Use  . Vaping Use: Never used  Substance and Sexual Activity  . Alcohol use: Not Currently  . Drug use: Never  . Sexual activity: Never  Other Topics Concern  . Not on file  Social History Narrative  . Not on file   Social Determinants of Health   Financial Resource Strain:   . Difficulty of Paying Living Expenses: Not on file  Food Insecurity:   . Worried About Charity fundraiser in the Last Year: Not on file  . Ran Out of Food in the Last Year: Not on file  Transportation Needs:   . Lack of Transportation (Medical): Not on file  . Lack of Transportation (Non-Medical): Not on file  Physical Activity:   . Days of Exercise per Week: Not on file  . Minutes of Exercise per Session: Not on file  Stress:   . Feeling of Stress : Not on file  Social Connections:   . Frequency of Communication with Friends and Family: Not on file  . Frequency of Social Gatherings with Friends and Family: Not on file  . Attends Religious Services: Not on file  . Active Member of Clubs or  Organizations: Not on file  . Attends Archivist Meetings: Not on file  . Marital Status: Not on file     Family History: The patient's family history includes Cancer in an other family member; Diabetes in her brother; Heart attack in her brother.  ROS:   Please see the history of present illness.    All other systems reviewed and are negative.  EKGs/Labs/Other Studies Reviewed:    The following studies were reviewed today:   EKG:  EKG is ordered today.  The ekg ordered today demonstrates normal sinus rhythm, rate 73, low voltage in precordial leads, no ST/T abnormalities  Recent Labs: 08/04/2019: ALT 23 10/07/2019: TSH 1.947 10/08/2019: Hemoglobin 11.4; Platelets 292 10/09/2019: BUN 14; Creatinine, Ser 0.87; Magnesium 1.7; Potassium 4.0; Sodium 131  Recent Lipid Panel No results found for: CHOL, TRIG, HDL, CHOLHDL, VLDL, LDLCALC, LDLDIRECT  Physical Exam:    VS:  BP (!) 152/78   Pulse 78   Ht 5\' 3"  (1.6 m)   Wt 173 lb 3.2 oz (78.6 kg)   SpO2 98%   BMI 30.68 kg/m     Wt Readings from Last 3 Encounters:  01/17/20 173 lb 3.2 oz (78.6 kg)  10/07/19 165 lb (74.8 kg)  08/08/19 172 lb (78 kg)     GEN:  in no acute distress HEENT: Normal NECK: No JVD CARDIAC: RRR, 2/6 systolic murmur RESPIRATORY:  Clear to auscultation without rales, wheezing or rhonchi  ABDOMEN: Soft, non-tender, non-distended MUSCULOSKELETAL: No edema SKIN: Warm and dry NEUROLOGIC:  Alert and oriented x 3 PSYCHIATRIC:  Normal affect   ASSESSMENT:    1. Nonrheumatic aortic valve stenosis   2. Essential hypertension    PLAN:     Aortic stenosis: Mild on echo 12/2018 (MG 13 mmHg, AVA 1.6 cm).  Will monitor with repeat echo next year  Hypertension: On nadolol 20 mg daily, amlodipine 5 mg daily, lisinopril 20 mg daily.  BP elevated, will increase amlodipine to 10 mg daily.  Asked patient to check BP daily for next 2 weeks and call with results  RTC in 1 year  Medication  Adjustments/Labs and Tests Ordered: Current medicines are reviewed at length with the patient today.  Concerns regarding medicines are outlined above.  Orders Placed This Encounter  Procedures  . ECHOCARDIOGRAM COMPLETE   Meds ordered this encounter  Medications  . amLODipine (NORVASC) 10 MG tablet    Sig: Take 1 tablet (10 mg total) by mouth daily. At 1500    Dispense:  90 tablet    Refill:  3    Dose increase    Patient Instructions  Medication Instructions:  INCREASE amlodipine to 10 mg daily  *If you need a refill on your cardiac medications before your next appointment, please call your pharmacy*  Testing/Procedures: Your physician has requested that you have an echocardiogram in 1 YEAR. Echocardiography is a painless test that uses sound waves to create images of your heart. It provides your doctor with information about the size and shape of your heart and how well your heart's chambers and valves are working. This procedure takes approximately one hour. There are no restrictions for this procedure. This will be done at our [facility] location:  [place]: At [facility], you and your health needs are our priority.  As part of our continuing mission to provide you with exceptional heart care, we have created designated Provider Care Teams.  These Care Teams include your primary Cardiologist (physician) and Advanced Practice Providers (APPs -  Physician Assistants and Nurse Practitioners) who all work together to provide you with the care you need, when you need it.  We recommend signing up for the patient portal called "MyChart".  Sign up information is provided on this After Visit Summary.  MyChart is used to connect with patients for Virtual Visits (Telemedicine).  Patients are able to  view lab/test results, encounter notes, upcoming appointments, etc.  Non-urgent messages can be sent to your provider as well.   To learn more about what you  can do with MyChart, go to [email or web address].    Your next appointment:   12 month(s) (after echo completed)  The format for your next appointment:   In Person  Provider:   Oswaldo Milian, MD   Other Instructions Please check your blood pressure at home daily, write it down.  Call the office or send message via Mychart with the readings in 2 weeks for Dr. Gardiner Rhyme to review.       Signed, Donato Heinz, MD  01/17/2020 1:11 PM    [place]

## 2020-01-17 NOTE — Patient Instructions (Signed)
Medication Instructions:  INCREASE amlodipine to 10 mg daily  *If you need a refill on your cardiac medications before your next appointment, please call your pharmacy*  Testing/Procedures: Your physician has requested that you have an echocardiogram in 1 YEAR. Echocardiography is a painless test that uses sound waves to create images of your heart. It provides your doctor with information about the size and shape of your heart and how well your heart's chambers and valves are working. This procedure takes approximately one hour. There are no restrictions for this procedure. This will be done at our [facility] location:  [place]: At [facility], you and your health needs are our priority.  As part of our continuing mission to provide you with exceptional heart care, we have created designated Provider Care Teams.  These Care Teams include your primary Cardiologist (physician) and Advanced Practice Providers (APPs -  Physician Assistants and Nurse Practitioners) who all work together to provide you with the care you need, when you need it.  We recommend signing up for the patient portal called "MyChart".  Sign up information is provided on this After Visit Summary.  MyChart is used to connect with patients for Virtual Visits (Telemedicine).  Patients are able to view lab/test results, encounter notes, upcoming appointments, etc.  Non-urgent messages can be sent to your provider as well.   To learn more about what you can do with MyChart, go to [email or web address].    Your next appointment:   12 month(s) (after echo completed)  The format for your next appointment:   In Person  Provider:   Oswaldo Milian, MD   Other Instructions Please check your blood pressure at home daily, write it down.  Call the office or send message via Mychart with the readings in 2 weeks for Dr. Gardiner Rhyme to review.

## 2020-01-25 ENCOUNTER — Other Ambulatory Visit: Payer: Self-pay | Admitting: Family Medicine

## 2020-01-25 DIAGNOSIS — K824 Cholesterolosis of gallbladder: Secondary | ICD-10-CM | POA: Diagnosis not present

## 2020-01-25 DIAGNOSIS — Z8543 Personal history of malignant neoplasm of ovary: Secondary | ICD-10-CM | POA: Diagnosis not present

## 2020-01-25 DIAGNOSIS — E782 Mixed hyperlipidemia: Secondary | ICD-10-CM | POA: Diagnosis not present

## 2020-01-25 DIAGNOSIS — G4700 Insomnia, unspecified: Secondary | ICD-10-CM | POA: Diagnosis not present

## 2020-01-25 DIAGNOSIS — E871 Hypo-osmolality and hyponatremia: Secondary | ICD-10-CM | POA: Diagnosis not present

## 2020-01-25 DIAGNOSIS — I10 Essential (primary) hypertension: Secondary | ICD-10-CM | POA: Diagnosis not present

## 2020-01-25 DIAGNOSIS — M810 Age-related osteoporosis without current pathological fracture: Secondary | ICD-10-CM | POA: Diagnosis not present

## 2020-01-25 DIAGNOSIS — Z0000 Encounter for general adult medical examination without abnormal findings: Secondary | ICD-10-CM | POA: Diagnosis not present

## 2020-01-25 DIAGNOSIS — N183 Chronic kidney disease, stage 3 unspecified: Secondary | ICD-10-CM | POA: Diagnosis not present

## 2020-01-25 DIAGNOSIS — I350 Nonrheumatic aortic (valve) stenosis: Secondary | ICD-10-CM | POA: Diagnosis not present

## 2020-01-28 DIAGNOSIS — I10 Essential (primary) hypertension: Secondary | ICD-10-CM | POA: Diagnosis not present

## 2020-01-28 DIAGNOSIS — W19XXXA Unspecified fall, initial encounter: Secondary | ICD-10-CM | POA: Diagnosis not present

## 2020-01-28 DIAGNOSIS — S098XXA Other specified injuries of head, initial encounter: Secondary | ICD-10-CM | POA: Diagnosis not present

## 2020-01-28 DIAGNOSIS — S199XXA Unspecified injury of neck, initial encounter: Secondary | ICD-10-CM | POA: Diagnosis not present

## 2020-01-28 DIAGNOSIS — S0990XA Unspecified injury of head, initial encounter: Secondary | ICD-10-CM | POA: Diagnosis not present

## 2020-01-28 DIAGNOSIS — R402412 Glasgow coma scale score 13-15, at arrival to emergency department: Secondary | ICD-10-CM | POA: Diagnosis not present

## 2020-01-28 DIAGNOSIS — Z743 Need for continuous supervision: Secondary | ICD-10-CM | POA: Diagnosis not present

## 2020-01-28 DIAGNOSIS — S022XXA Fracture of nasal bones, initial encounter for closed fracture: Secondary | ICD-10-CM | POA: Diagnosis not present

## 2020-01-28 DIAGNOSIS — T1490XA Injury, unspecified, initial encounter: Secondary | ICD-10-CM | POA: Diagnosis not present

## 2020-02-01 DIAGNOSIS — S022XXA Fracture of nasal bones, initial encounter for closed fracture: Secondary | ICD-10-CM | POA: Diagnosis not present

## 2020-02-07 DIAGNOSIS — H0102B Squamous blepharitis left eye, upper and lower eyelids: Secondary | ICD-10-CM | POA: Diagnosis not present

## 2020-02-07 DIAGNOSIS — H40013 Open angle with borderline findings, low risk, bilateral: Secondary | ICD-10-CM | POA: Diagnosis not present

## 2020-02-07 DIAGNOSIS — H31091 Other chorioretinal scars, right eye: Secondary | ICD-10-CM | POA: Diagnosis not present

## 2020-02-07 DIAGNOSIS — Z961 Presence of intraocular lens: Secondary | ICD-10-CM | POA: Diagnosis not present

## 2020-02-07 DIAGNOSIS — H34832 Tributary (branch) retinal vein occlusion, left eye, with macular edema: Secondary | ICD-10-CM | POA: Diagnosis not present

## 2020-02-07 DIAGNOSIS — S0011XA Contusion of right eyelid and periocular area, initial encounter: Secondary | ICD-10-CM | POA: Diagnosis not present

## 2020-02-07 DIAGNOSIS — H0102A Squamous blepharitis right eye, upper and lower eyelids: Secondary | ICD-10-CM | POA: Diagnosis not present

## 2020-02-07 DIAGNOSIS — S0012XA Contusion of left eyelid and periocular area, initial encounter: Secondary | ICD-10-CM | POA: Diagnosis not present

## 2020-02-07 DIAGNOSIS — H25811 Combined forms of age-related cataract, right eye: Secondary | ICD-10-CM | POA: Diagnosis not present

## 2020-02-07 DIAGNOSIS — H18513 Endothelial corneal dystrophy, bilateral: Secondary | ICD-10-CM | POA: Diagnosis not present

## 2020-02-07 DIAGNOSIS — H16223 Keratoconjunctivitis sicca, not specified as Sjogren's, bilateral: Secondary | ICD-10-CM | POA: Diagnosis not present

## 2020-02-17 ENCOUNTER — Ambulatory Visit (INDEPENDENT_AMBULATORY_CARE_PROVIDER_SITE_OTHER): Payer: Medicare Other

## 2020-02-17 ENCOUNTER — Other Ambulatory Visit: Payer: Self-pay

## 2020-02-17 ENCOUNTER — Ambulatory Visit (INDEPENDENT_AMBULATORY_CARE_PROVIDER_SITE_OTHER): Payer: Medicare Other | Admitting: Podiatry

## 2020-02-17 DIAGNOSIS — S93491A Sprain of other ligament of right ankle, initial encounter: Secondary | ICD-10-CM | POA: Diagnosis not present

## 2020-02-17 DIAGNOSIS — M79671 Pain in right foot: Secondary | ICD-10-CM

## 2020-02-21 ENCOUNTER — Encounter (INDEPENDENT_AMBULATORY_CARE_PROVIDER_SITE_OTHER): Payer: Medicare Other | Admitting: Ophthalmology

## 2020-02-22 ENCOUNTER — Encounter: Payer: Self-pay | Admitting: Podiatry

## 2020-02-22 ENCOUNTER — Telehealth: Payer: Self-pay | Admitting: Podiatry

## 2020-02-22 NOTE — Progress Notes (Signed)
Subjective:  Patient ID: [name], female    DOB: 1929-12-25,  MRN: [id number]  Chief Complaint  Patient presents with  . Foot Pain    Right foot is swollen, PT stated that it is painful if she walks alot    87 y.o. female presents with the above complaint. Patient presents with a complaint of right ankle sprain.  Patient states the right foot is very swollen is painful when she walks a lot.  She does not have as much pain when she is not ambulating a lot.  She states that it has been going on for quite some time.  She has not been seen any by any other provider for this particular thing.  She does not recall any twisting injury.  She does have some history of ankle sprain.  But she does not recall when the most recent ankle sprain underwent.  She denies any other acute complaints.   Review of Systems: Negative except as noted in the HPI. Denies N/V/F/Ch.  Past Medical History:  Diagnosis Date  . Anemia    hx of   . Anemia associated with acute blood loss 04/03/2011  . Arthritis    knees   . ARTHRITIS, SEPTIC 05/28/2009   Qualifier: Diagnosis of  By: Tommy Medal MD, Roderic Scarce    . Ascites   . Blood transfusion    hx of 2011   . Complication of anesthesia    Sodium drops per pt   . Cystitis   . DIARRHEA, ANTIBIOTIC ASSOCIATED 05/31/2009   Qualifier: Diagnosis of  By: Tommy Medal MD, Roderic Scarce    . GERD (gastroesophageal reflux disease)   . H/O hiatal hernia   . Hyperlipidemia   . Hypertension   . Hyponatremia   . Neutropenia with fever ([place]) 05/18/2011  . OSTEOMYELITIS, CHRONIC, LOWER LEG 04/23/2009   Qualifier: Diagnosis of  By: Johnnye Sima MD, Dellis Filbert    . Osteopenia   . OSTEOPOROSIS 04/23/2009   Qualifier: Diagnosis of  By: Johnnye Sima MD, Dellis Filbert    . Ovarian cancer ([place]) 01/25/2011  . Pneumonia    hx of   . Postmenopausal atrophic vaginitis   . PROSTHETIC JOINT COMPLICATION 5/57/3220   Qualifier: Diagnosis of  By: Tommy Medal MD, Roderic Scarce    . Renal disorder    Decreased kidney  function  . Staph infection 2010   after knee replacement  . Urinary frequency   . Vulvitis     Current Outpatient Medications:  .  amLODipine (NORVASC) 10 MG tablet, Take 1 tablet (10 mg total) by mouth daily. At 1500, Disp: 90 tablet, Rfl: 3 .  azelastine (ASTELIN) 0.1 % nasal spray, Place 1 spray into both nostrils 2 (two) times daily as needed for rhinitis or allergies. , Disp: , Rfl:  .  B Complex Vitamins (VITAMIN B COMPLEX PO), Take 1 tablet by mouth daily. , Disp: , Rfl:  .  calcium carbonate (TUMS EX) 750 MG chewable tablet, Chew 1 tablet by mouth daily., Disp: , Rfl:  .  Cholecalciferol (VITAMIN D) 2000 UNITS tablet, Take 2,000 Units by mouth daily. , Disp: , Rfl:  .  docusate sodium (COLACE) 100 MG capsule, Take 100 mg by mouth daily as needed for mild constipation. , Disp: , Rfl:  .  famotidine (PEPCID) 10 MG tablet, Take 10 mg by mouth daily as needed for heartburn or indigestion., Disp: , Rfl:  .  fenofibrate (TRICOR) 145 MG tablet, Take 145 mg by mouth daily. with food, Disp: , Rfl:  .  lisinopril (ZESTRIL) 20 MG tablet, Take 20 mg by mouth daily., Disp: , Rfl:  .  Multiple Vitamins-Minerals (CENTRUM SILVER 50+MEN PO), Take 1 tablet by mouth daily, Disp: , Rfl:  .  nadolol (CORGARD) 20 MG tablet, Take 20 mg by mouth daily. , Disp: , Rfl:  .  omeprazole (PRILOSEC) 20 MG capsule, Take 1 capsule (20 mg total) by mouth 2 (two) times daily before a meal., Disp: , Rfl:  .  Polyethyl Glycol-Propyl Glycol (SYSTANE) 0.4-0.3 % SOLN, Apply 1 drop to eye daily as needed. , Disp: , Rfl:  .  RESTASIS 0.05 % ophthalmic emulsion, Place 1 drop into both eyes in the morning and at bedtime., Disp: , Rfl:  .  zolpidem (AMBIEN) 10 MG tablet, Take 5-10 mg by mouth daily as needed for sleep. , Disp: , Rfl:   Social History   Tobacco Use  Smoking Status Former Smoker  . Quit date: 02/11/1952  . Years since quitting: 68.0  Smokeless Tobacco Never Used    Allergies  Allergen Reactions  .  Morphine And Related Other (See Comments)    Given after knee replacement and code blue occurred  . Penicillins Hives and Itching    syncope  . Shellfish Allergy Nausea And Vomiting and Other (See Comments)    Pt has shellfish allergy only.  Has had IV contrast x 2 and did fine.   Objective:  There were no vitals filed for this visit. There is no height or weight on file to calculate BMI. Constitutional Well developed. Well nourished.  Vascular Dorsalis pedis pulses palpable bilaterally. Posterior tibial pulses palpable bilaterally. Capillary refill normal to all digits.  No cyanosis or clubbing noted. Pedal hair growth normal.  Neurologic Normal speech. Oriented to person, place, and time. Epicritic sensation to light touch grossly present bilaterally.  Dermatologic Nails well groomed and normal in appearance. No open wounds. No skin lesions.  Orthopedic:  Pain on palpation to the right lateral ankle at the level of the ATFL ligament.  Pain with resisted dorsiflexion eversion of the foot.   pain with plantarflexion inversion of the foot without resistance.  No pain with dorsiflexion of the ankle joint.  No pain at the Achilles tendon, posterior tibial tendon, peroneal tendon   Radiographs: 3 views of skeletally mature adult right foot: Bunion deformity noted.  Osteoarthritic changes noted to the midfoot.  Plantar and posterior heel spurring noted.  Hammertoe contractures noted.  No other bony abnormalities identified  Assessment:   1. Sprain of anterior talofibular ligament of right ankle, initial encounter    Plan:  Patient was evaluated and treated and all questions answered.  Right ankle sprain mild -I explained to the patient the etiology of ankle sprain and various treatment options were discussed.  Given that her pain is only exacerbated when she is ambulating for long period of time I believe patient will benefit from Tri-Lock ankle brace to give stability to the ankle  joint when ambulating.  I encouraged her to utilize the brace as much as possible especially for long walks into good supportive shoes.  I discussed shoe gear modification as well.  Patient states understanding. -If there is no improvement we will discuss cam boot immobilization at that time.  No follow-ups on file.

## 2020-02-23 ENCOUNTER — Ambulatory Visit: Payer: Medicare Other | Admitting: Podiatry

## 2020-03-02 ENCOUNTER — Encounter (HOSPITAL_COMMUNITY): Payer: Self-pay

## 2020-03-02 ENCOUNTER — Emergency Department (HOSPITAL_COMMUNITY): Payer: Medicare Other

## 2020-03-02 ENCOUNTER — Emergency Department (HOSPITAL_COMMUNITY)
Admission: EM | Admit: 2020-03-02 | Discharge: 2020-03-02 | Disposition: A | Discharge status: Home / Self Care | Payer: Medicare Other | Attending: Emergency Medicine | Admitting: Emergency Medicine

## 2020-03-02 ENCOUNTER — Other Ambulatory Visit: Payer: Self-pay

## 2020-03-02 DIAGNOSIS — Z87891 Personal history of nicotine dependence: Secondary | ICD-10-CM | POA: Insufficient documentation

## 2020-03-02 DIAGNOSIS — R55 Syncope and collapse: Secondary | ICD-10-CM | POA: Insufficient documentation

## 2020-03-02 DIAGNOSIS — I129 Hypertensive chronic kidney disease with stage 1 through stage 4 chronic kidney disease, or unspecified chronic kidney disease: Secondary | ICD-10-CM | POA: Diagnosis not present

## 2020-03-02 DIAGNOSIS — N183 Chronic kidney disease, stage 3 unspecified: Secondary | ICD-10-CM | POA: Diagnosis not present

## 2020-03-02 DIAGNOSIS — Z79899 Other long term (current) drug therapy: Secondary | ICD-10-CM | POA: Diagnosis not present

## 2020-03-02 DIAGNOSIS — Z96651 Presence of right artificial knee joint: Secondary | ICD-10-CM | POA: Insufficient documentation

## 2020-03-02 DIAGNOSIS — R197 Diarrhea, unspecified: Secondary | ICD-10-CM | POA: Diagnosis not present

## 2020-03-02 DIAGNOSIS — S0990XA Unspecified injury of head, initial encounter: Secondary | ICD-10-CM | POA: Diagnosis not present

## 2020-03-02 DIAGNOSIS — J0130 Acute sphenoidal sinusitis, unspecified: Secondary | ICD-10-CM | POA: Diagnosis not present

## 2020-03-02 LAB — CBC
HCT: 41.6 % (ref 36.0–46.0)
Hemoglobin: 13.6 g/dL (ref 12.0–15.0)
MCH: 28.3 pg (ref 26.0–34.0)
MCHC: 32.7 g/dL (ref 30.0–36.0)
MCV: 86.5 fL (ref 80.0–100.0)
Platelets: 378 10*3/uL (ref 150–400)
RBC: 4.81 MIL/uL (ref 3.87–5.11)
RDW: 14 % (ref 11.5–15.5)
WBC: 9.4 10*3/uL (ref 4.0–10.5)
nRBC: 0 % (ref 0.0–0.2)

## 2020-03-02 LAB — COMPREHENSIVE METABOLIC PANEL
ALT: 26 U/L (ref 0–44)
AST: 37 U/L (ref 15–41)
Albumin: 3.3 g/dL — ABNORMAL LOW (ref 3.5–5.0)
Alkaline Phosphatase: 86 U/L (ref 38–126)
Anion gap: 10 (ref 5–15)
BUN: 26 mg/dL — ABNORMAL HIGH (ref 8–23)
CO2: 21 mmol/L — ABNORMAL LOW (ref 22–32)
Calcium: 9.6 mg/dL (ref 8.9–10.3)
Chloride: 102 mmol/L (ref 98–111)
Creatinine, Ser: 1.17 mg/dL — ABNORMAL HIGH (ref 0.44–1.00)
GFR, Estimated: 45 mL/min — ABNORMAL LOW (ref >60)
Glucose, Bld: 148 mg/dL — ABNORMAL HIGH (ref 70–99)
Potassium: 4.8 mmol/L (ref 3.5–5.1)
Sodium: 133 mmol/L — ABNORMAL LOW (ref 135–145)
Total Bilirubin: 0.5 mg/dL (ref 0.3–1.2)
Total Protein: 8.1 g/dL (ref 6.5–8.1)

## 2020-03-02 LAB — URINALYSIS, ROUTINE W REFLEX MICROSCOPIC
Bilirubin Urine: NEGATIVE
Glucose, UA: NEGATIVE mg/dL
Hgb urine dipstick: NEGATIVE
Ketones, ur: NEGATIVE mg/dL
Nitrite: NEGATIVE
Protein, ur: NEGATIVE mg/dL
Specific Gravity, Urine: 1.008 (ref 1.005–1.030)
pH: 5 (ref 5.0–8.0)

## 2020-03-02 LAB — MAGNESIUM: Magnesium: 1.5 mg/dL — ABNORMAL LOW (ref 1.7–2.4)

## 2020-03-02 LAB — LIPASE, BLOOD: Lipase: 40 U/L (ref 11–51)

## 2020-03-02 MED ORDER — MAGNESIUM OXIDE 400 (241.3 MG) MG PO TABS
800.0000 mg | ORAL_TABLET | Freq: Once | ORAL | Status: AC
  Administered 2020-03-02: 800 mg via ORAL
  Filled 2020-03-02: qty 2

## 2020-03-02 MED ORDER — SODIUM CHLORIDE 0.9 % IV BOLUS
1000.0000 mL | Freq: Once | INTRAVENOUS | Status: AC
  Administered 2020-03-02: 1000 mL via INTRAVENOUS

## 2020-03-02 NOTE — Discharge Instructions (Signed)
She likely has viral gastroenteritis.  Please keep her hydrated.  Expect some diarrhea for the last several days.  Her CT head was unremarkable and her labs just showed mild dehydration  Follow-up with your doctor  Return to ER if you have another episode of syncope, chest pain, headaches, vomiting, severe abdominal pain, fevers

## 2020-03-02 NOTE — ED Triage Notes (Signed)
Per EMS- Patient is from [facility].  Patient c/o diarrhea x 5-6 times in the past 24-48 hours. Staff reported a near syncopal episode at midnight.

## 2020-03-02 NOTE — ED Notes (Signed)
Patient is drinking juice now. She said she will be able to provide a urine sample shortly.

## 2020-03-02 NOTE — ED Notes (Signed)
Pt assisted to bedside toilet. UA sent to lab at this time

## 2020-03-02 NOTE — ED Notes (Signed)
Patient placed on purewick to obtain urine sample 

## 2020-03-02 NOTE — ED Provider Notes (Signed)
  Physical Exam  BP 140/69   Pulse 88   Temp 98.3 F (36.8 C) (Oral)   Resp (!) 21   Ht 5\' 3"  (1.6 m)   Wt 70.3 kg   SpO2 99%   BMI 27.46 kg/m   Physical Exam  ED Course/Procedures     Procedures  MDM  Care assumed at 3 pm.  Patient is here with some diarrhea and near syncopal event.  Patient has mild AKI and magnesium is slightly low.  States she hit her head signout pending CT head and urinalysis  4:02 PM CT head unremarkable.  Urinalysis shows some white blood cells but no nitrite and rare bacteria.  I think this is contamination from her gastroenteritis. Patient was hydrated in the ED and magnesium was replaced.  Stable for discharge back to facility.       Drenda Freeze, MD 03/02/20 [phone]

## 2020-03-02 NOTE — ED Provider Notes (Signed)
[facility] DEPT Provider Note   CSN: [id number] Arrival date & time: 03/02/20  1019     History Chief Complaint  Patient presents with  . Diarrhea    [name] is a 87 y.o. female.  87 yo F with a chief complaint of diarrhea.  Patient has had about 8 episodes of diarrhea yesterday.  Not had any significant episodes today.  At some point yesterday she also stood up and felt a bit lightheaded and thinks maybe she passed out.  Denies any injury.  Denied headache chest pain, denies abdominal pain denies nausea or vomiting.  She denies cough or congestion.  Denies fevers.  Started taking Pepto-Bismol at the onset and she thinks it turned her stool dark.  Otherwise denies dark stool or blood in her stool.  Denies urinary symptoms.  The history is provided by the patient.  Illness Severity:  Moderate Onset quality:  Gradual Duration:  1 day Timing:  Constant Progression:  Worsening Chronicity:  New Associated symptoms: diarrhea   Associated symptoms: no abdominal pain, no chest pain, no congestion, no fever, no headaches, no myalgias, no nausea, no rhinorrhea, no shortness of breath, no vomiting and no wheezing        Past Medical History:  Diagnosis Date  . Anemia    hx of   . Anemia associated with acute blood loss 04/03/2011  . Arthritis    knees   . ARTHRITIS, SEPTIC 05/28/2009   Qualifier: Diagnosis of  By: Tommy Medal MD, Roderic Scarce    . Ascites   . Blood transfusion    hx of 2011   . Complication of anesthesia    Sodium drops per pt   . Cystitis   . DIARRHEA, ANTIBIOTIC ASSOCIATED 05/31/2009   Qualifier: Diagnosis of  By: Tommy Medal MD, Roderic Scarce    . GERD (gastroesophageal reflux disease)   . H/O hiatal hernia   . Hyperlipidemia   . Hypertension   . Hyponatremia   . Neutropenia with fever ([place]) 05/18/2011  . OSTEOMYELITIS, CHRONIC, LOWER LEG 04/23/2009   Qualifier: Diagnosis of  By: Johnnye Sima MD, Dellis Filbert    . Osteopenia   . OSTEOPOROSIS  04/23/2009   Qualifier: Diagnosis of  By: Johnnye Sima MD, Dellis Filbert    . Ovarian cancer ([place]) 01/25/2011  . Pneumonia    hx of   . Postmenopausal atrophic vaginitis   . PROSTHETIC JOINT COMPLICATION [id number]   Qualifier: Diagnosis of  By: Tommy Medal MD, Roderic Scarce    . Renal disorder    Decreased kidney function  . Staph infection 2010   after knee replacement  . Urinary frequency   . Vulvitis     Patient Active Problem List   Diagnosis Date Noted  . Acute lower UTI 10/07/2019  . Diarrhea 10/07/2019  . Acute cystitis without hematuria   . Muscle spasm of right lower extremity 08/05/2019  . Hyponatremia 08/05/2019  . Insomnia 08/05/2019  . Slow transit constipation 08/05/2019  . Atherosclerosis of abdominal aorta ([place]) 10/02/2015  . Obesity (BMI 30-39.9) 10/31/2014  . Chronic kidney disease (CKD), stage III (moderate) ([place]) 10/31/2014  . Ventral incisional hernia 10/31/2014  . Primary peritoneal carcinomatosis ([place]) 10/31/2014  . Incisional hernia, periumbilical, without obstruction or gangrene 11/03/2012  . Anemia associated with acute blood loss 04/03/2011  . Ovarian cancer ([place]) 01/25/2011  . HYPERLIPIDEMIA 04/23/2009  . Essential hypertension 04/23/2009  . GERD 04/23/2009  . Osteoarthritis 04/23/2009  . Disorder of bone and cartilage 04/23/2009  Past Surgical History:  Procedure Laterality Date  . ABDOMINAL HYSTERECTOMY  04/01/2011   Procedure: HYSTERECTOMY ABDOMINAL;  Surgeon: Jeannette Corpusaniel L Clarke-Pearson, MD;  Location: [facility] ORS;  Service: Gynecology;  Laterality: N/A;  . APPENDECTOMY    . JOINT REPLACEMENT     R knee in 2008, 5 operations on L knee  . LAPAROTOMY  04/01/2011   Procedure: EXPLORATORY LAPAROTOMY;  Surgeon: Jeannette Corpusaniel L Clarke-Pearson, MD;  Location: [facility] ORS;  Service: Gynecology;  Laterality: N/A;  . OTHER SURGICAL HISTORY     hx of C section 1966  . SALPINGOOPHORECTOMY  04/01/2011   Procedure: SALPINGO OOPHERECTOMY;  Surgeon: Jeannette Corpusaniel L Clarke-Pearson, MD;  Location:  [facility] ORS;  Service: Gynecology;  Laterality: Bilateral;  . TONSILLECTOMY       OB History    Gravida      Para      Term      Preterm      AB      Living  3     SAB      IAB      Ectopic      Multiple      Live Births              Family History  Problem Relation Age of Onset  . Cancer Other        Bladder cancer  . Heart attack Brother   . Diabetes Brother     Social History   Tobacco Use  . Smoking status: Former Smoker    Quit date: 02/11/1952    Years since quitting: 68.1  . Smokeless tobacco: Never Used  Vaping Use  . Vaping Use: Never used  Substance Use Topics  . Alcohol use: Not Currently  . Drug use: Never    Home Medications Prior to Admission medications   Medication Sig Start Date End Date Taking? Authorizing Provider  amLODipine (NORVASC) 10 MG tablet Take 1 tablet (10 mg total) by mouth daily. At 1500 01/17/20   Little IshikawaSchumann, Christopher L, MD  azelastine (ASTELIN) 0.1 % nasal spray Place 1 spray into both nostrils 2 (two) times daily as needed for rhinitis or allergies.  12/06/15   [provider]  B Complex Vitamins (VITAMIN B COMPLEX PO) Take 1 tablet by mouth daily.     [provider]  calcium carbonate (TUMS EX) 750 MG chewable tablet Chew 1 tablet by mouth daily.    [provider]  Cholecalciferol (VITAMIN D) 2000 UNITS tablet Take 2,000 Units by mouth daily.     [provider]  docusate sodium (COLACE) 100 MG capsule Take 100 mg by mouth daily as needed for mild constipation.     [provider]  famotidine (PEPCID) 10 MG tablet Take 10 mg by mouth daily as needed for heartburn or indigestion.    [provider]  fenofibrate (TRICOR) 145 MG tablet Take 145 mg by mouth daily. with food 07/24/18   [provider]  lisinopril (ZESTRIL) 20 MG tablet Take 20 mg by mouth daily. 10/18/18   [provider]  Multiple Vitamins-Minerals (CENTRUM SILVER 50+MEN PO) Take 1 tablet  by mouth daily    [provider]  nadolol (CORGARD) 20 MG tablet Take 20 mg by mouth daily.     [provider]  omeprazole (PRILOSEC) 20 MG capsule Take 1 capsule (20 mg total) by mouth 2 (two) times daily before a meal. 10/10/19   Lanae BoastKc, Ramesh, MD  Polyethyl Glycol-Propyl Glycol (SYSTANE) 0.4-0.3 % SOLN  Apply 1 drop to eye daily as needed.     [provider]  RESTASIS 0.05 % ophthalmic emulsion Place 1 drop into both eyes in the morning and at bedtime. 09/02/19   [provider]  zolpidem (AMBIEN) 10 MG tablet Take 5-10 mg by mouth daily as needed for sleep.     [provider]    Allergies    Morphine and related, Nsaids, Penicillins, and Shellfish allergy  Review of Systems   Review of Systems  Constitutional: Negative for chills and fever.  HENT: Negative for congestion and rhinorrhea.   Eyes: Negative for redness and visual disturbance.  Respiratory: Negative for shortness of breath and wheezing.   Cardiovascular: Negative for chest pain and palpitations.  Gastrointestinal: Positive for diarrhea. Negative for abdominal pain, nausea and vomiting.  Genitourinary: Negative for dysuria and urgency.  Musculoskeletal: Negative for arthralgias and myalgias.  Skin: Negative for pallor and wound.  Neurological: Positive for syncope. Negative for dizziness and headaches.    Physical Exam Updated Vital Signs BP 140/69   Pulse 88   Temp 98.3 F (36.8 C) (Oral)   Resp (!) 21   Ht 5\' 3"  (1.6 m)   Wt 70.3 kg   SpO2 99%   BMI 27.46 kg/m   Physical Exam Vitals and nursing note reviewed.  Constitutional:      General: She is not in acute distress.    Appearance: She is well-developed and well-nourished. She is not diaphoretic.  HENT:     Head: Normocephalic and atraumatic.  Eyes:     Extraocular Movements: EOM normal.     Pupils: Pupils are equal, round, and reactive to light.  Cardiovascular:     Rate and Rhythm: Normal rate and regular  rhythm.     Heart sounds: No murmur heard. No friction rub. No gallop.   Pulmonary:     Effort: Pulmonary effort is normal.     Breath sounds: No wheezing or rales.  Abdominal:     General: There is no distension.     Palpations: Abdomen is soft.     Tenderness: There is no abdominal tenderness.  Musculoskeletal:        General: No tenderness or edema.     Cervical back: Normal range of motion and neck supple.  Skin:    General: Skin is warm and dry.  Neurological:     Mental Status: She is alert and oriented to person, place, and time.  Psychiatric:        Mood and Affect: Mood and affect normal.        Behavior: Behavior normal.     ED Results / Procedures / Treatments   Labs (all labs ordered are listed, but only abnormal results are displayed) Labs Reviewed  COMPREHENSIVE METABOLIC PANEL - Abnormal; Notable for the following components:      Result Value   Sodium 133 (*)    CO2 21 (*)    Glucose, Bld 148 (*)    BUN 26 (*)    Creatinine, Ser 1.17 (*)    Albumin 3.3 (*)    GFR, Estimated 45 (*)    All other components within normal limits  URINALYSIS, ROUTINE W REFLEX MICROSCOPIC - Abnormal; Notable for the following components:   APPearance HAZY (*)    Leukocytes,Ua TRACE (*)    Bacteria, UA RARE (*)    All other components within normal limits  MAGNESIUM - Abnormal; Notable for the following components:   Magnesium 1.5 (*)  All other components within normal limits  LIPASE, BLOOD  CBC    EKG EKG Interpretation  Date/Time:  Friday March 02 2020 12:38:28 EST Ventricular Rate:  80 PR Interval:    QRS Duration: 76 QT Interval:  385 QTC Calculation: 445 R Axis:   -23 Text Interpretation: Sinus rhythm Inferior infarct, old no wpw, prolonged qt or brugada, No significant change since last tracing Confirmed by Deno Etienne [phone]) on 03/02/2020 12:55:32 PM   Radiology CT Head Wo Contrast  Result Date: 03/02/2020 CLINICAL DATA:  Head trauma, complaining of  diarrhea 5-6 times in the past 24 hours with near syncopal episode. EXAM: CT HEAD WITHOUT CONTRAST TECHNIQUE: Contiguous axial images were obtained from the base of the skull through the vertex without intravenous contrast. COMPARISON:  August 04, 2019 FINDINGS: Brain: No evidence of acute infarction, hemorrhage, hydrocephalus, extra-axial collection or mass lesion/mass effect. Signs of atrophy and chronic microvascular ischemic change as before. RIGHT parafalcine meningioma stable at approximately 1 cm in the coronal plane, long term stability, since 2011, no follow-up necessary. Vascular: No hyperdense vessel or unexpected calcification. Skull: Normal. Negative for fracture or focal lesion. Sinuses/Orbits: Chronic LEFT sphenoid sinusitis with complete opacification of the LEFT sphenoid is similar to the prior study. Other: None. IMPRESSION: 1. No acute intracranial pathology. 2. Signs of atrophy and chronic microvascular ischemic change as on prior studies. 3. Chronic LEFT sphenoid sinusitis with complete opacification of the LEFT sphenoid is similar to the prior study. Electronically Signed   By: Zetta Bills M.D.   On: 03/02/2020 15:34    Procedures Procedures (including critical care time)  Medications Ordered in ED Medications  sodium chloride 0.9 % bolus 1,000 mL (0 mLs Intravenous Stopped 03/02/20 1504)  magnesium oxide (MAG-OX) tablet 800 mg (800 mg Oral Given 03/02/20 1247)    ED Course  I have reviewed the triage vital signs and the nursing notes.  Pertinent labs & imaging results that were available during my care of the patient were reviewed by me and considered in my medical decision making (see chart for details).    MDM Rules/Calculators/A&P                          87 yo F with a chief complaint of diarrhea.  Going on since yesterday.  Also had an episode yesterday where she thinks she lost consciousness.  She is well-appearing and nontoxic.  No chest pain no abdominal pain  afebrile.  We will give a bolus of IV fluids.  Magnesium is slightly low.  No anemia.  Slightly low sodium.  Reassess.  Family is now at bedside and feels that the patient's nose looks slightly deformed compared to baseline.  Thinks maybe she struck her head when she had syncopized.  Further history patient was also on the commode when this happened.  We will obtain a CT scan of the head.  Signed out to Dr. Darl Householder, please see his note for further details of care in the ED.   The patients results and plan were reviewed and discussed.   Any x-rays performed were independently reviewed by myself.   Differential diagnosis were considered with the presenting HPI.  Medications  sodium chloride 0.9 % bolus 1,000 mL (0 mLs Intravenous Stopped 03/02/20 1504)  magnesium oxide (MAG-OX) tablet 800 mg (800 mg Oral Given 03/02/20 1247)    Vitals:   03/02/20 1124 03/02/20 1239 03/02/20 1400 03/02/20 1445  BP: 118/73 (!) 145/77 Marland Kitchen)  146/75 140/69  Pulse: 85 81 89 88  Resp: 19 (!) 27 20 (!) 21  Temp: 98.3 F (36.8 C)     TempSrc: Oral     SpO2: 99% 100% 99% 99%  Weight:      Height:        Final diagnoses:  Diarrhea in adult patient  Syncope and collapse    Admission/ observation were discussed with the admitting physician, patient and/or family and they are comfortable with the plan.   Final Clinical Impression(s) / ED Diagnoses Final diagnoses:  Diarrhea in adult patient  Syncope and collapse    Rx / DC Orders ED Discharge Orders    None       Deno Etienne, DO 03/02/20 1544

## 2020-03-04 LAB — URINE CULTURE: Culture: NO GROWTH

## 2020-03-15 DIAGNOSIS — L821 Other seborrheic keratosis: Secondary | ICD-10-CM | POA: Diagnosis not present

## 2020-03-15 DIAGNOSIS — L814 Other melanin hyperpigmentation: Secondary | ICD-10-CM | POA: Diagnosis not present

## 2020-03-15 DIAGNOSIS — L570 Actinic keratosis: Secondary | ICD-10-CM | POA: Diagnosis not present

## 2020-03-16 ENCOUNTER — Ambulatory Visit: Payer: Medicare Other | Admitting: Podiatry

## 2020-04-03 ENCOUNTER — Other Ambulatory Visit: Payer: Self-pay | Admitting: Family Medicine

## 2020-04-03 DIAGNOSIS — K824 Cholesterolosis of gallbladder: Secondary | ICD-10-CM

## 2020-04-06 ENCOUNTER — Emergency Department (HOSPITAL_COMMUNITY): Payer: Medicare Other

## 2020-04-06 ENCOUNTER — Encounter (HOSPITAL_COMMUNITY): Payer: Self-pay | Admitting: Emergency Medicine

## 2020-04-06 ENCOUNTER — Other Ambulatory Visit: Payer: Self-pay

## 2020-04-06 ENCOUNTER — Inpatient Hospital Stay (HOSPITAL_COMMUNITY)
Admission: EM | Admit: 2020-04-06 | Discharge: 2020-04-11 | DRG: 312 | Disposition: A | Discharge status: Skilled Nursing Facility | Payer: Medicare Other | Attending: Internal Medicine | Admitting: Internal Medicine

## 2020-04-06 ENCOUNTER — Observation Stay (HOSPITAL_BASED_OUTPATIENT_CLINIC_OR_DEPARTMENT_OTHER): Payer: Medicare Other

## 2020-04-06 DIAGNOSIS — I700 Atherosclerosis of aorta: Secondary | ICD-10-CM | POA: Diagnosis present

## 2020-04-06 DIAGNOSIS — S060X9A Concussion with loss of consciousness of unspecified duration, initial encounter: Secondary | ICD-10-CM | POA: Diagnosis present

## 2020-04-06 DIAGNOSIS — Z90710 Acquired absence of both cervix and uterus: Secondary | ICD-10-CM

## 2020-04-06 DIAGNOSIS — Z96653 Presence of artificial knee joint, bilateral: Secondary | ICD-10-CM | POA: Diagnosis present

## 2020-04-06 DIAGNOSIS — R9431 Abnormal electrocardiogram [ECG] [EKG]: Secondary | ICD-10-CM | POA: Diagnosis not present

## 2020-04-06 DIAGNOSIS — Z885 Allergy status to narcotic agent status: Secondary | ICD-10-CM

## 2020-04-06 DIAGNOSIS — G319 Degenerative disease of nervous system, unspecified: Secondary | ICD-10-CM | POA: Diagnosis not present

## 2020-04-06 DIAGNOSIS — R55 Syncope and collapse: Secondary | ICD-10-CM | POA: Diagnosis present

## 2020-04-06 DIAGNOSIS — I129 Hypertensive chronic kidney disease with stage 1 through stage 4 chronic kidney disease, or unspecified chronic kidney disease: Secondary | ICD-10-CM | POA: Diagnosis present

## 2020-04-06 DIAGNOSIS — Z8249 Family history of ischemic heart disease and other diseases of the circulatory system: Secondary | ICD-10-CM

## 2020-04-06 DIAGNOSIS — Z20822 Contact with and (suspected) exposure to covid-19: Secondary | ICD-10-CM | POA: Diagnosis not present

## 2020-04-06 DIAGNOSIS — E669 Obesity, unspecified: Secondary | ICD-10-CM | POA: Diagnosis present

## 2020-04-06 DIAGNOSIS — Z91013 Allergy to seafood: Secondary | ICD-10-CM

## 2020-04-06 DIAGNOSIS — R52 Pain, unspecified: Secondary | ICD-10-CM

## 2020-04-06 DIAGNOSIS — I351 Nonrheumatic aortic (valve) insufficiency: Secondary | ICD-10-CM

## 2020-04-06 DIAGNOSIS — J3489 Other specified disorders of nose and nasal sinuses: Secondary | ICD-10-CM | POA: Diagnosis not present

## 2020-04-06 DIAGNOSIS — M810 Age-related osteoporosis without current pathological fracture: Secondary | ICD-10-CM | POA: Diagnosis present

## 2020-04-06 DIAGNOSIS — I951 Orthostatic hypotension: Secondary | ICD-10-CM | POA: Diagnosis not present

## 2020-04-06 DIAGNOSIS — R609 Edema, unspecified: Secondary | ICD-10-CM | POA: Diagnosis not present

## 2020-04-06 DIAGNOSIS — Z886 Allergy status to analgesic agent status: Secondary | ICD-10-CM

## 2020-04-06 DIAGNOSIS — M7731 Calcaneal spur, right foot: Secondary | ICD-10-CM | POA: Diagnosis not present

## 2020-04-06 DIAGNOSIS — N1831 Chronic kidney disease, stage 3a: Secondary | ICD-10-CM | POA: Diagnosis present

## 2020-04-06 DIAGNOSIS — K219 Gastro-esophageal reflux disease without esophagitis: Secondary | ICD-10-CM | POA: Diagnosis present

## 2020-04-06 DIAGNOSIS — Z79899 Other long term (current) drug therapy: Secondary | ICD-10-CM

## 2020-04-06 DIAGNOSIS — N183 Chronic kidney disease, stage 3 unspecified: Secondary | ICD-10-CM | POA: Diagnosis present

## 2020-04-06 DIAGNOSIS — S0181XA Laceration without foreign body of other part of head, initial encounter: Secondary | ICD-10-CM | POA: Diagnosis not present

## 2020-04-06 DIAGNOSIS — I959 Hypotension, unspecified: Secondary | ICD-10-CM | POA: Diagnosis not present

## 2020-04-06 DIAGNOSIS — W19XXXA Unspecified fall, initial encounter: Secondary | ICD-10-CM

## 2020-04-06 DIAGNOSIS — E785 Hyperlipidemia, unspecified: Secondary | ICD-10-CM | POA: Diagnosis present

## 2020-04-06 DIAGNOSIS — I10 Essential (primary) hypertension: Secondary | ICD-10-CM | POA: Diagnosis present

## 2020-04-06 DIAGNOSIS — W1830XA Fall on same level, unspecified, initial encounter: Secondary | ICD-10-CM | POA: Diagnosis present

## 2020-04-06 DIAGNOSIS — S0121XA Laceration without foreign body of nose, initial encounter: Secondary | ICD-10-CM | POA: Diagnosis present

## 2020-04-06 DIAGNOSIS — R42 Dizziness and giddiness: Secondary | ICD-10-CM | POA: Diagnosis not present

## 2020-04-06 DIAGNOSIS — S022XXA Fracture of nasal bones, initial encounter for closed fracture: Secondary | ICD-10-CM | POA: Diagnosis not present

## 2020-04-06 DIAGNOSIS — I350 Nonrheumatic aortic (valve) stenosis: Secondary | ICD-10-CM

## 2020-04-06 DIAGNOSIS — Z9079 Acquired absence of other genital organ(s): Secondary | ICD-10-CM

## 2020-04-06 DIAGNOSIS — G9389 Other specified disorders of brain: Secondary | ICD-10-CM | POA: Diagnosis not present

## 2020-04-06 DIAGNOSIS — K5901 Slow transit constipation: Secondary | ICD-10-CM | POA: Diagnosis present

## 2020-04-06 DIAGNOSIS — Y92009 Unspecified place in unspecified non-institutional (private) residence as the place of occurrence of the external cause: Secondary | ICD-10-CM

## 2020-04-06 DIAGNOSIS — M7989 Other specified soft tissue disorders: Secondary | ICD-10-CM | POA: Diagnosis not present

## 2020-04-06 DIAGNOSIS — R410 Disorientation, unspecified: Secondary | ICD-10-CM | POA: Diagnosis not present

## 2020-04-06 DIAGNOSIS — Z90722 Acquired absence of ovaries, bilateral: Secondary | ICD-10-CM

## 2020-04-06 DIAGNOSIS — N3000 Acute cystitis without hematuria: Secondary | ICD-10-CM | POA: Diagnosis not present

## 2020-04-06 DIAGNOSIS — Z8543 Personal history of malignant neoplasm of ovary: Secondary | ICD-10-CM

## 2020-04-06 DIAGNOSIS — Z87891 Personal history of nicotine dependence: Secondary | ICD-10-CM

## 2020-04-06 DIAGNOSIS — Z6829 Body mass index (BMI) 29.0-29.9, adult: Secondary | ICD-10-CM

## 2020-04-06 DIAGNOSIS — M25571 Pain in right ankle and joints of right foot: Secondary | ICD-10-CM | POA: Diagnosis present

## 2020-04-06 DIAGNOSIS — Z880 Allergy status to penicillin: Secondary | ICD-10-CM

## 2020-04-06 LAB — ECHOCARDIOGRAM COMPLETE
AR max vel: 2.55 cm2
AV Area VTI: 1.91 cm2
AV Area mean vel: 2.25 cm2
AV Mean grad: 10 mmHg
AV Peak grad: 18 mmHg
Ao pk vel: 2.12 m/s
Area-P 1/2: 3.72 cm2
Calc EF: 77.8 %
P 1/2 time: 393 msec
S' Lateral: 2 cm
Single Plane A2C EF: 79.9 %
Single Plane A4C EF: 75.4 %

## 2020-04-06 LAB — URINALYSIS, ROUTINE W REFLEX MICROSCOPIC
Bilirubin Urine: NEGATIVE
Glucose, UA: NEGATIVE mg/dL
Hgb urine dipstick: NEGATIVE
Ketones, ur: NEGATIVE mg/dL
Nitrite: NEGATIVE
Protein, ur: NEGATIVE mg/dL
Specific Gravity, Urine: 1.012 (ref 1.005–1.030)
pH: 5 (ref 5.0–8.0)

## 2020-04-06 LAB — CBC WITH DIFFERENTIAL/PLATELET
Abs Immature Granulocytes: 0.06 10*3/uL (ref 0.00–0.07)
Basophils Absolute: 0.1 10*3/uL (ref 0.0–0.1)
Basophils Relative: 0 %
Eosinophils Absolute: 0.1 10*3/uL (ref 0.0–0.5)
Eosinophils Relative: 1 %
HCT: 42.6 % (ref 36.0–46.0)
Hemoglobin: 14.1 g/dL (ref 12.0–15.0)
Immature Granulocytes: 1 %
Lymphocytes Relative: 13 %
Lymphs Abs: 1.5 10*3/uL (ref 0.7–4.0)
MCH: 28.7 pg (ref 26.0–34.0)
MCHC: 33.1 g/dL (ref 30.0–36.0)
MCV: 86.6 fL (ref 80.0–100.0)
Monocytes Absolute: 0.8 10*3/uL (ref 0.1–1.0)
Monocytes Relative: 7 %
Neutro Abs: 9.2 10*3/uL — ABNORMAL HIGH (ref 1.7–7.7)
Neutrophils Relative %: 78 %
Platelets: 281 10*3/uL (ref 150–400)
RBC: 4.92 MIL/uL (ref 3.87–5.11)
RDW: 15.4 % (ref 11.5–15.5)
WBC: 11.7 10*3/uL — ABNORMAL HIGH (ref 4.0–10.5)
nRBC: 0 % (ref 0.0–0.2)

## 2020-04-06 LAB — BASIC METABOLIC PANEL
Anion gap: 11 (ref 5–15)
BUN: 23 mg/dL (ref 8–23)
CO2: 20 mmol/L — ABNORMAL LOW (ref 22–32)
Calcium: 9.4 mg/dL (ref 8.9–10.3)
Chloride: 100 mmol/L (ref 98–111)
Creatinine, Ser: 1.12 mg/dL — ABNORMAL HIGH (ref 0.44–1.00)
GFR, Estimated: 47 mL/min — ABNORMAL LOW (ref >60)
Glucose, Bld: 136 mg/dL — ABNORMAL HIGH (ref 70–99)
Potassium: 4.6 mmol/L (ref 3.5–5.1)
Sodium: 131 mmol/L — ABNORMAL LOW (ref 135–145)

## 2020-04-06 LAB — TROPONIN I (HIGH SENSITIVITY)
Troponin I (High Sensitivity): 8 ng/L (ref <18)
Troponin I (High Sensitivity): 5 ng/L (ref <18)

## 2020-04-06 LAB — SARS CORONAVIRUS 2 (TAT 6-24 HRS): SARS Coronavirus 2: NEGATIVE

## 2020-04-06 LAB — TSH: TSH: 2.357 u[IU]/mL (ref 0.350–4.500)

## 2020-04-06 MED ORDER — ONDANSETRON HCL 4 MG/2ML IJ SOLN: 4.0000 mg | Freq: Four times a day (QID) | INTRAMUSCULAR | Status: DC | PRN

## 2020-04-06 MED ORDER — SODIUM CHLORIDE 0.9 % IV SOLN: INTRAVENOUS | Status: DC

## 2020-04-06 MED ORDER — ONDANSETRON HCL 4 MG PO TABS: 4.0000 mg | ORAL_TABLET | Freq: Four times a day (QID) | ORAL | Status: DC | PRN

## 2020-04-06 MED ORDER — CYCLOSPORINE 0.05 % OP EMUL
1.0000 [drp] | Freq: Two times a day (BID) | OPHTHALMIC | Status: DC
  Administered 2020-04-06 – 2020-04-11 (×11): 1 [drp] via OPHTHALMIC
  Filled 2020-04-06 (×13): qty 1

## 2020-04-06 MED ORDER — SODIUM CHLORIDE 0.9 % IV BOLUS
1000.0000 mL | Freq: Once | INTRAVENOUS | Status: AC
  Administered 2020-04-06: 1000 mL via INTRAVENOUS

## 2020-04-06 MED ORDER — FENOFIBRATE 160 MG PO TABS: 160.0000 mg | ORAL_TABLET | Freq: Every day | ORAL | Status: DC

## 2020-04-06 MED ORDER — HYDRALAZINE HCL 20 MG/ML IJ SOLN
5.0000 mg | INTRAMUSCULAR | Status: DC | PRN
  Filled 2020-04-06: qty 1

## 2020-04-06 MED ORDER — LACTATED RINGERS IV SOLN: INTRAVENOUS | Status: DC

## 2020-04-06 MED ORDER — CALCIUM CARBONATE ANTACID 500 MG PO CHEW: 1.0000 | CHEWABLE_TABLET | Freq: Every day | ORAL | Status: DC | PRN

## 2020-04-06 MED ORDER — ACETAMINOPHEN 325 MG PO TABS: 650.0000 mg | ORAL_TABLET | Freq: Four times a day (QID) | ORAL | Status: DC | PRN

## 2020-04-06 MED ORDER — ACETAMINOPHEN 650 MG RE SUPP: 650.0000 mg | Freq: Four times a day (QID) | RECTAL | Status: DC | PRN

## 2020-04-06 MED ORDER — PANTOPRAZOLE SODIUM 40 MG PO TBEC: 40.0000 mg | DELAYED_RELEASE_TABLET | Freq: Every day | ORAL | Status: DC

## 2020-04-06 MED ORDER — ENOXAPARIN SODIUM 30 MG/0.3ML ~~LOC~~ SOLN
30.0000 mg | SUBCUTANEOUS | Status: DC
  Administered 2020-04-06 – 2020-04-09 (×4): 30 mg via SUBCUTANEOUS
  Filled 2020-04-06 (×4): qty 0.3

## 2020-04-06 MED ORDER — SODIUM CHLORIDE 0.9% FLUSH
3.0000 mL | Freq: Two times a day (BID) | INTRAVENOUS | Status: DC
  Administered 2020-04-06 – 2020-04-10 (×6): 3 mL via INTRAVENOUS

## 2020-04-06 MED ORDER — HYPROMELLOSE (GONIOSCOPIC) 2.5 % OP SOLN: 1.0000 [drp] | Freq: Every day | OPHTHALMIC | Status: DC | PRN

## 2020-04-06 MED ORDER — LIDOCAINE-EPINEPHRINE 1 %-1:100000 IJ SOLN
20.0000 mL | Freq: Once | INTRAMUSCULAR | Status: DC
  Filled 2020-04-06: qty 1

## 2020-04-06 NOTE — ED Notes (Addendum)
Lacerations cleaned and irrigated, dressing reapplied

## 2020-04-06 NOTE — ED Notes (Signed)
Echo at bedside

## 2020-04-06 NOTE — ED Notes (Signed)
Attempted to get orthostatic vital signs on pt When sitting up pt states she feels dizzy, after a few minutes feels better and attempts to stand. Unable to stand long enough for a BP due to ankle pain and weakness   Pt continues to ask repetitive questions. But is alert and oriented x4

## 2020-04-06 NOTE — Discharge Planning (Signed)
RNCM contacted [facility] [phone] to alert them that pt may need higher level of care when she returns.

## 2020-04-06 NOTE — Progress Notes (Signed)
Orthopedic Tech Progress Note Patient Details:  [name] 02/28/1929 [id number] Fitted patient with CAM WALKER, removed and let at bedside, helped NT with changing the linen, patient soiled the bed.  Ortho Devices Type of Ortho Device: CAM walker Ortho Device/Splint Location: RLE Ortho Device/Splint Interventions: Ordered,Application,Adjustment   Post Interventions Patient Tolerated: Well Instructions Provided: Care of [place] 04/06/2020, 6:14 PM

## 2020-04-06 NOTE — ED Provider Notes (Signed)
[facility] EMERGENCY DEPARTMENT Provider Note  CSN: [id number] Arrival date & time: 04/06/20 0143  Chief Complaint(s) Loss of Consciousness ED Triage Notes Rise Patience, RN (Registered Nurse) . Marland Kitchen Emergency Medicine . Marland Kitchen Date of Service: 04/06/2020 1:46 AM . . Signed   Pt BIB [facility] from [facility], pt had a syncopal episode while having a bowel movement. Pt reports feeling dizzy prior to the fall. Pt A&O x 4 at baseline, has repetitive questioning currently. Hypotensive with EMS, BP 72/42, improved to 108/66 after 250ccNS. Deformity to nose, lac to bridge of her nose and lac to right side of forehead.       HPI [name] is a 87 y.o. female who presents to the emergency department after syncopal episode resulting in facial trauma.  This occurred while patient was having a bowel movement.  Patient reports feeling lightheaded and like she was going to pass out and remembers waking up on the floor.  EMS was called.  In route patient's blood pressure dropped to the 70s at which time she complained about feeling lightheaded again.  She responded to small IV fluid bolus.  Patient denies any recent fevers or infections.  No coughing or congestion.  No associated chest pain or shortness of breath.  No abdominal pain.  No nausea vomiting.  No diarrhea.  Patient reports having had a similar presentation last month.  The history is provided by the patient.    Past Medical History Past Medical History:  Diagnosis Date  . Anemia    hx of   . Anemia associated with acute blood loss 04/03/2011  . Arthritis    knees   . ARTHRITIS, SEPTIC 05/28/2009   Qualifier: Diagnosis of  By: Tommy Medal MD, Roderic Scarce    . Ascites   . Blood transfusion    hx of 2011   . Complication of anesthesia    Sodium drops per pt   . Cystitis   . DIARRHEA, ANTIBIOTIC ASSOCIATED 05/31/2009   Qualifier: Diagnosis of  By: Tommy Medal MD, Roderic Scarce    . GERD (gastroesophageal reflux disease)    . H/O hiatal hernia   . Hyperlipidemia   . Hypertension   . Hyponatremia   . Neutropenia with fever ([place]) 05/18/2011  . OSTEOMYELITIS, CHRONIC, LOWER LEG 04/23/2009   Qualifier: Diagnosis of  By: Johnnye Sima MD, Dellis Filbert    . Osteopenia   . OSTEOPOROSIS 04/23/2009   Qualifier: Diagnosis of  By: Johnnye Sima MD, Dellis Filbert    . Ovarian cancer ([place]) 01/25/2011  . Pneumonia    hx of   . Postmenopausal atrophic vaginitis   . PROSTHETIC JOINT COMPLICATION 0/25/4270   Qualifier: Diagnosis of  By: Tommy Medal MD, Roderic Scarce    . Renal disorder    Decreased kidney function  . Staph infection 2010   after knee replacement  . Urinary frequency   . Vulvitis    Patient Active Problem List   Diagnosis Date Noted  . Acute lower UTI 10/07/2019  . Diarrhea 10/07/2019  . Acute cystitis without hematuria   . Muscle spasm of right lower extremity 08/05/2019  . Hyponatremia 08/05/2019  . Insomnia 08/05/2019  . Slow transit constipation 08/05/2019  . Atherosclerosis of abdominal aorta ([place]) 10/02/2015  . Obesity (BMI 30-39.9) 10/31/2014  . Chronic kidney disease (CKD), stage III (moderate) ([place]) 10/31/2014  . Ventral incisional hernia 10/31/2014  . Primary peritoneal carcinomatosis ([place]) 10/31/2014  . Incisional hernia, periumbilical, without obstruction or gangrene 11/03/2012  . Anemia  associated with acute blood loss 04/03/2011  . Ovarian cancer ([place]) 01/25/2011  . HYPERLIPIDEMIA 04/23/2009  . Essential hypertension 04/23/2009  . GERD 04/23/2009  . Osteoarthritis 04/23/2009  . Disorder of bone and cartilage 04/23/2009   Home Medication(s) Prior to Admission medications   Medication Sig Start Date End Date Taking? Authorizing Provider  amLODipine (NORVASC) 10 MG tablet Take 1 tablet (10 mg total) by mouth daily. At 1500 01/17/20   Donato Heinz, MD  azelastine (ASTELIN) 0.1 % nasal spray Place 1 spray into both nostrils 2 (two) times daily as needed for rhinitis or allergies.  12/06/15   [provider]  B Complex Vitamins (VITAMIN B COMPLEX PO) Take 1 tablet by mouth daily.     [provider]  calcium carbonate (TUMS EX) 750 MG chewable tablet Chew 1 tablet by mouth daily.    [provider]  Cholecalciferol (VITAMIN D) 2000 UNITS tablet Take 2,000 Units by mouth daily.     [provider]  docusate sodium (COLACE) 100 MG capsule Take 100 mg by mouth daily as needed for mild constipation.     [provider]  famotidine (PEPCID) 10 MG tablet Take 10 mg by mouth daily as needed for heartburn or indigestion.    [provider]  fenofibrate (TRICOR) 145 MG tablet Take 145 mg by mouth daily. with food 07/24/18   [provider]  lisinopril (ZESTRIL) 20 MG tablet Take 20 mg by mouth daily. 10/18/18   [provider]  Multiple Vitamins-Minerals (CENTRUM SILVER 50+MEN PO) Take 1 tablet by mouth daily    [provider]  nadolol (CORGARD) 20 MG tablet Take 20 mg by mouth daily.     [provider]  omeprazole (PRILOSEC) 20 MG capsule Take 1 capsule (20 mg total) by mouth 2 (two) times daily before a meal. 10/10/19   Antonieta Pert, MD  Polyethyl Glycol-Propyl Glycol (SYSTANE) 0.4-0.3 % SOLN Apply 1 drop to eye daily as needed.     [provider]  RESTASIS 0.05 % ophthalmic emulsion Place 1 drop into both eyes in the morning and at bedtime. 09/02/19   [provider]  zolpidem (AMBIEN) 10 MG tablet Take 5-10 mg by mouth daily as needed for sleep.     [provider]                                                                                                                                    Past Surgical History Past Surgical History:  Procedure Laterality Date  . ABDOMINAL HYSTERECTOMY  04/01/2011   Procedure: HYSTERECTOMY ABDOMINAL;  Surgeon: Alvino Chapel, MD;  Location: [facility];  Service: Gynecology;  Laterality: N/A;  . APPENDECTOMY    . JOINT REPLACEMENT     R knee in  2008, 5 operations on L knee  . LAPAROTOMY  04/01/2011   Procedure: EXPLORATORY LAPAROTOMY;  Surgeon: Cherly Anderson  Clarke-Pearson, MD;  Location: [facility];  Service: Gynecology;  Laterality: N/A;  . OTHER SURGICAL HISTORY     hx of C section 1966  . SALPINGOOPHORECTOMY  04/01/2011   Procedure: SALPINGO OOPHERECTOMY;  Surgeon: Alvino Chapel, MD;  Location: [facility];  Service: Gynecology;  Laterality: Bilateral;  . TONSILLECTOMY     Family History Family History  Problem Relation Age of Onset  . Cancer Other        Bladder cancer  . Heart attack Brother   . Diabetes Brother     Social History Social History   Tobacco Use  . Smoking status: Former Smoker    Quit date: 02/11/1952    Years since quitting: 68.1  . Smokeless tobacco: Never Used  Vaping Use  . Vaping Use: Never used  Substance Use Topics  . Alcohol use: Not Currently  . Drug use: Never   Allergies Morphine and related, Nsaids, Penicillins, and Shellfish allergy  Review of Systems Review of Systems All other systems are reviewed and are negative for acute change except as noted in the HPI  Physical Exam Vital Signs  I have reviewed the triage vital signs BP 102/70   Pulse 86   Temp (!) 97.5 F (36.4 C) (Temporal)   Resp 19   SpO2 97%   Physical Exam Constitutional:      General: She is not in acute distress.    Appearance: She is well-developed and well-nourished. She is not diaphoretic.     Interventions: Cervical collar in place.  HENT:     Head: Normocephalic. Laceration present.      Right Ear: External ear normal.     Left Ear: External ear normal.     Nose: Nasal deformity, signs of injury, laceration and nasal tenderness present.   Eyes:     General: No scleral icterus.       Right eye: No discharge.        Left eye: No discharge.     Extraocular Movements: EOM normal.     Conjunctiva/sclera: Conjunctivae normal.     Pupils: Pupils are equal, round, and reactive to light.   Cardiovascular:     Rate and Rhythm: Normal rate and regular rhythm.     Pulses:          Radial pulses are 2+ on the right side and 2+ on the left side.       Dorsalis pedis pulses are 2+ on the right side and 2+ on the left side.     Heart sounds: Normal heart sounds. No murmur heard. No friction rub. No gallop.   Pulmonary:     Effort: Pulmonary effort is normal. No respiratory distress.     Breath sounds: Normal breath sounds. No stridor. No wheezing.  Abdominal:     General: There is no distension.     Palpations: Abdomen is soft.     Tenderness: There is no abdominal tenderness.  Musculoskeletal:        General: No edema.     Cervical back: Normal range of motion and neck supple. No rigidity or bony tenderness. No pain with movement, spinous process tenderness or muscular tenderness. Normal range of motion.     Thoracic back: No bony tenderness.     Lumbar back: No bony tenderness.     Right ankle: Swelling present. Tenderness present. Normal range of motion.     Comments: Clavicles stable. Chest stable to AP/Lat compression. Pelvis stable to Lat compression. No obvious  extremity deformity. No chest or abdominal wall contusion.  Skin:    General: Skin is warm and dry.     Findings: No erythema or rash.  Neurological:     Mental Status: She is alert and oriented to person, place, and time.     Comments: Moving all extremities  Psychiatric:        Mood and Affect: Mood and affect normal.     ED Results and Treatments Labs (all labs ordered are listed, but only abnormal results are displayed) Labs Reviewed  BASIC METABOLIC PANEL - Abnormal; Notable for the following components:      Result Value   Sodium 131 (*)    CO2 20 (*)    Glucose, Bld 136 (*)    Creatinine, Ser 1.12 (*)    GFR, Estimated 47 (*)    All other components within normal limits  CBC WITH DIFFERENTIAL/PLATELET - Abnormal; Notable for the following components:   WBC 11.7 (*)    Neutro Abs 9.2 (*)     All other components within normal limits  URINALYSIS, ROUTINE W REFLEX MICROSCOPIC  CBG MONITORING, ED  TROPONIN I (HIGH SENSITIVITY)  TROPONIN I (HIGH SENSITIVITY)                                                                                                                         EKG  EKG Interpretation  Date/Time:  Friday April 06 2020 01:56:40 EST Ventricular Rate:  70 PR Interval:    QRS Duration: 100 QT Interval:  409 QTC Calculation: 442 R Axis:   16 Text Interpretation: Sinus rhythm Low voltage, precordial leads No acute changes Confirmed by Addison Lank [phone]) on 04/06/2020 2:05:20 AM      Radiology DG Ankle Complete Right  Result Date: 04/06/2020 CLINICAL DATA:  Syncopal episode. EXAM: RIGHT ANKLE - COMPLETE 3+ VIEW COMPARISON:  June 09, 2017 FINDINGS: There is no evidence of an acute fracture, dislocation, or joint effusion. A chronic deformity is seen involving the distal shaft of the right fibula. There is a moderate-sized plantar calcaneal spur. Soft tissues are unremarkable. IMPRESSION: Chronic deformity of the distal right fibula. Electronically Signed   By: Virgina Norfolk M.D.   On: 04/06/2020 02:45   CT HEAD WO CONTRAST  Result Date: 04/06/2020 CLINICAL DATA:  Syncopal episode. EXAM: CT HEAD WITHOUT CONTRAST TECHNIQUE: Contiguous axial images were obtained from the base of the skull through the vertex without intravenous contrast. COMPARISON:  March 02, 2020 FINDINGS: Brain: There is mild cerebral atrophy with widening of the extra-axial spaces and ventricular dilatation. There are areas of decreased attenuation within the white matter tracts of the supratentorial brain, consistent with microvascular disease changes. A stable 1 cm para falcine calcified right occipital lobe meningioma is noted. Vascular: No hyperdense vessels are identified. Skull: Nondisplaced bilateral nasal bone fractures of indeterminate age are seen. Sinuses/Orbits: Marked severity  sphenoid sinus mucosal thickening is present. Other: Mild to moderate severity frontal scalp soft tissue swelling  is noted along the midline. IMPRESSION: 1. Mild to moderate severity frontal scalp soft tissue swelling without evidence of an acute fracture or acute intracranial abnormality. 2. Stable 1 cm para falcine calcified right occipital lobe meningioma. 3. Bilateral nondisplaced nasal bone fractures of indeterminate age. Electronically Signed   By: Virgina Norfolk M.D.   On: 04/06/2020 02:42   CT Maxillofacial Wo Contrast  Result Date: 04/06/2020 CLINICAL DATA:  Syncopal episode. EXAM: CT MAXILLOFACIAL WITHOUT CONTRAST TECHNIQUE: Multidetector CT imaging of the maxillofacial structures was performed. Multiplanar CT image reconstructions were also generated. COMPARISON:  None. FINDINGS: Osseous: Bilateral nondisplaced nasal bone fractures of indeterminate age are seen. Orbits: Negative. No traumatic or inflammatory finding. Sinuses: There is marked severity sphenoid sinus mucosal thickening. Soft tissues: Mild to moderate severity frontal scalp soft tissue swelling is seen, along the midline. Limited intracranial: No significant or unexpected finding. IMPRESSION: 1. Bilateral nondisplaced nasal bone fractures. 2. Mild to moderate severity frontal scalp soft tissue swelling. 3. Marked severity sphenoid sinus mucosal thickening. Electronically Signed   By: Virgina Norfolk M.D.   On: 04/06/2020 02:43    Pertinent labs & imaging results that were available during my care of the patient were reviewed by me and considered in my medical decision making (see chart for details).  Medications Ordered in ED Medications  sodium chloride 0.9 % bolus 1,000 mL (0 mLs Intravenous Stopped 04/06/20 0530)    And  0.9 %  sodium chloride infusion ( Intravenous Stopped 04/06/20 0650)  lidocaine-EPINEPHrine (XYLOCAINE W/EPI) 1 %-1:100000 (with pres) injection 20 mL (has no administration in time range)  sodium  chloride 0.9 % bolus 1,000 mL (1,000 mLs Intravenous New Bag/Given 04/06/20 0650)                                                                                                                                    Procedures .Marland KitchenLaceration Repair  Date/Time: 04/06/2020 4:39 AM Performed by: Fatima Blank, MD Authorized by: Fatima Blank, MD   Consent:    Consent obtained:  Verbal   Consent given by:  Patient   Risks discussed:  Need for additional repair, poor cosmetic result and poor wound healing   Alternatives discussed:  No treatment Universal protocol:    Relevant documents present and verified: yes     Patient identity confirmed:  Arm band Anesthesia:    Anesthesia method:  None Laceration details:    Location:  Face   Face location:  Nose   Length (cm):  1   Depth (mm):  2 Pre-procedure details:    Preparation:  Patient was prepped and draped in usual sterile fashion and imaging obtained to evaluate for foreign bodies Exploration:    Wound extent: no fascia violation noted, no foreign bodies/material noted and no vascular damage noted   Treatment:    Amount of cleaning:  Standard   Irrigation solution:  Sterile saline   Irrigation  method:  Pressure wash   Debridement:  None Skin repair:    Repair method:  Steri-Strips   Number of Steri-Strips:  2 Approximation:    Approximation:  Close Repair type:    Repair type:  Simple .Marland KitchenLaceration Repair  Date/Time: 04/06/2020 4:40 AM Performed by: Fatima Blank, MD Authorized by: Fatima Blank, MD   Laceration details:    Location:  Face   Face location:  Forehead   Length (cm):  3   Depth (mm):  3 Exploration:    Hemostasis achieved with:  Direct pressure   Wound extent: no foreign bodies/material noted, no muscle damage noted and no vascular damage noted   Treatment:    Amount of cleaning:  Standard   Irrigation solution:  Sterile saline   Irrigation method:  Pressure wash    Debridement:  None Skin repair:    Repair method:  Steri-Strips   Number of Steri-Strips:  3 Approximation:    Approximation:  Close Repair type:    Repair type:  Simple .Marland KitchenLaceration Repair  Date/Time: 04/06/2020 4:41 AM Performed by: Fatima Blank, MD Authorized by: Fatima Blank, MD   Laceration details:    Location:  Face   Face location:  Forehead   Length (cm):  1.5   Depth (mm):  2 Treatment:    Amount of cleaning:  Standard   Irrigation solution:  Sterile saline   Irrigation method:  Pressure wash Skin repair:    Repair method:  Steri-Strips   Number of Steri-Strips:  3 Approximation:    Approximation:  Close .1-3 Lead EKG Interpretation Performed by: Fatima Blank, MD Authorized by: Fatima Blank, MD     Interpretation: normal     ECG rate:  68   ECG rate assessment: normal     Rhythm: sinus rhythm     Ectopy: none     Conduction: normal   .Critical Care Performed by: Fatima Blank, MD Authorized by: Fatima Blank, MD   Critical care provider statement:    Critical care time (minutes):  45   Critical care was necessary to treat or prevent imminent or life-threatening deterioration of the following conditions:  Dehydration and circulatory failure   Critical care was time spent personally by me on the following activities:  Discussions with consultants, evaluation of patient's response to treatment, examination of patient, ordering and performing treatments and interventions, ordering and review of laboratory studies, ordering and review of radiographic studies, pulse oximetry, re-evaluation of patient's condition, obtaining history from patient or surrogate and review of old charts    (including critical care time)  Medical Decision Making / ED Course I have reviewed the nursing notes for this encounter and the patient's prior records (if available in EHR or on provided paperwork).   DAISA STENNIS  was evaluated in Emergency Department on 04/06/2020 for the symptoms described in the history of present illness. She was evaluated in the context of the global COVID-19 pandemic, which necessitated consideration that the patient might be at risk for infection with the SARS-CoV-2 virus that causes COVID-19. Institutional protocols and algorithms that pertain to the evaluation of patients at risk for COVID-19 are in a state of rapid change based on information released by regulatory bodies including the CDC and federal and state organizations. These policies and algorithms were followed during the patient's care in the ED.  Syncopal episode during a bowel movement resulting in facial trauma. Noted to have low blood pressures in route.  Patient reports having a history of hypertension on medications.  Denies taking additional meds.  No recent medical changes. Initial BP soft with systolics in the 53Z. IV fluids were provided.  Screening labs are grossly reassuring without anemia. Stable electrolyte derangements and renal function.  EKG without acute ischemic changes, dysrhythmias, blocks. Troponin negative.  The patient is alert and oriented, without evidence of intoxication. They have no midline cervical tenderness, distracting injuries, or neuro deficits. Cervical spine cleared by Nexus criteria. Collar removed.  CT head negative.  CT face with old nasal bone fracture no acute injuries.  Lacerations irrigated and closed as above.  Spoke with patient's daughter who confirmed parts of history from above including no recent medication changes and similar episode in the past.  After IV fluids, patient's blood pressure normalized.  Orthostatics were positive upon standing with systolic blood pressures dropping from 140s while sitting to low 100s upon standing.  Will provide patient with additional IV fluids.  Daughter now at bedside and informed of patient's condition including mild concussion.  We  will consult TOC for assistance to place patient in short-term SNF at her current independent living facility.      Final Clinical Impression(s) / ED Diagnoses Final diagnoses:  Fall  Syncope and collapse  Facial laceration, initial encounter      This chart was dictated using voice recognition software.  Despite best efforts to proofread,  errors can occur which can change the documentation meaning.   Fatima Blank, MD 04/07/20 [phone]

## 2020-04-06 NOTE — ED Triage Notes (Signed)
Pt BIB [facility] from [facility], pt had a syncopal episode while having a bowel movement. Pt reports feeling dizzy prior to the fall. Pt A&O x 4 at baseline, has repetitive questioning currently. Hypotensive with EMS, BP 72/42, improved to 108/66 after 250ccNS. Deformity to nose, lac to bridge of her nose and lac to right side of forehead.

## 2020-04-06 NOTE — Progress Notes (Signed)
Called daughter Almyra Free per pt request, no answer, left message

## 2020-04-06 NOTE — Discharge Planning (Signed)
RNCM consulted regarding safe discharge planning (Home with Home Health vs Skilled Nursing Facility Placement).  Physical Therapy evaluation placed; will follow up after recommendations from PT.     

## 2020-04-06 NOTE — Evaluation (Signed)
Physical Therapy Evaluation Patient Details Name: [name] MRN: [id number] DOB: 1929/05/10 Today's Date: 04/06/2020   History of Present Illness  Pt is an 87 y/o female admitted following syncopal episode at ILF. Pt also had another episode while in ED after using bathroom. Pt complaining of R ankle pain, however, imaging negative. Per notes, pt with possible concussion. PMH includes ovarian CA; HTN; and HLD.  Clinical Impression  Pt admitted secondary to problem above with deficits below. Pt presenting with R ankle pain, memory deficits, and decreased activity tolerance. Pt with positive orthostatics, so mobility limited. (BP in supine 151/57; in sitting 148/81; in standing 99/83.) Pt currently lives alone at [place]. Feel she will benefit from SNF level therapies at d/c. Will continue to follow acutely.     Follow Up Recommendations SNF    Equipment Recommendations  None recommended by PT    Recommendations for Other Services       Precautions / Restrictions Precautions Precautions: Fall Precaution Comments: watch BP Restrictions Weight Bearing Restrictions: No      Mobility  Bed Mobility Overal bed mobility: Needs Assistance Bed Mobility: Supine to Sit;Sit to Supine     Supine to sit: Min assist Sit to supine: Supervision   General bed mobility comments: Min A for trunk elevation to come to sitting. Increased time required.    Transfers Overall transfer level: Needs assistance Equipment used: 1 person hand held assist Transfers: Sit to/from Stand Sit to Stand: Min assist         General transfer comment: Min A for steadying assist. Pt with increased dizziness in standing and +orthostatics, so further mobility deferred.  Ambulation/Gait                Stairs            Wheelchair Mobility    Modified Rankin (Stroke Patients Only)       Balance Overall balance assessment: Needs assistance Sitting-balance support: No upper extremity  supported Sitting balance-Leahy Scale: Fair     Standing balance support: Single extremity supported;During functional activity Standing balance-Leahy Scale: Poor Standing balance comment: Reliant on at least 1UE and external support                             Pertinent Vitals/Pain Pain Assessment: Faces Faces Pain Scale: Hurts even more Pain Location: R ankle Pain Descriptors / Indicators: Grimacing;Guarding Pain Intervention(s): Limited activity within patient's tolerance;Monitored during session;Repositioned    Home Living Family/patient expects to be discharged to:: Skilled nursing facility                 Additional Comments: Pt from ILF at baseline and is very independent    Prior Function Level of Independence: Independent with assistive device(s)         Comments: Uses RW or rollator all of the time; Independent with ADLS     Hand Dominance        Extremity/Trunk Assessment   Upper Extremity Assessment Upper Extremity Assessment: Defer to OT evaluation    Lower Extremity Assessment Lower Extremity Assessment: Generalized weakness;RLE deficits/detail RLE Deficits / Details: Pt with increased difficulty weightbearing secondary to pain in R ankle    Cervical / Trunk Assessment Cervical / Trunk Assessment: Normal  Communication   Communication: HOH  Cognition Arousal/Alertness: Awake/alert Behavior During Therapy: WFL for tasks assessed/performed Overall Cognitive Status: No family/caregiver present to determine baseline cognitive functioning  General Comments: Short term memory deficits noted as pt did not remember syncopal episode while in hospital. Pt tended to repeat herself.      General Comments General comments (skin integrity, edema, etc.): Pt's BP in supine 151/57; in sitting 148/81; in standing 99/83.    Exercises     Assessment/Plan    PT Assessment Patient needs continued  PT services  PT Problem List Decreased strength;Decreased balance;Decreased activity tolerance;Decreased mobility;Decreased cognition;Decreased safety awareness;Decreased knowledge of use of DME;Decreased knowledge of precautions       PT Treatment Interventions DME instruction;Gait training;Functional mobility training;Therapeutic exercise;Balance training;Therapeutic activities;Patient/family education    PT Goals (Current goals can be found in the Care Plan section)  Acute Rehab PT Goals Patient Stated Goal: to feel better PT Goal Formulation: With patient Time For Goal Achievement: 04/20/20 Potential to Achieve Goals: Fair    Frequency Min 2X/week   Barriers to discharge        Co-evaluation               AM-PAC PT "6 Clicks" Mobility  Outcome Measure Help needed turning from your back to your side while in a flat bed without using bedrails?: A Little Help needed moving from lying on your back to sitting on the side of a flat bed without using bedrails?: A Little Help needed moving to and from a bed to a chair (including a wheelchair)?: A Little Help needed standing up from a chair using your arms (e.g., wheelchair or bedside chair)?: A Little Help needed to walk in hospital room?: A Lot Help needed climbing 3-5 steps with a railing? : Total 6 Click Score: 15    End of Session Equipment Utilized During Treatment: Gait belt Activity Tolerance: Treatment limited secondary to medical complications (Comment) (+ orthostatics) Patient left: in bed;with call bell/phone within reach (on stretcher in ED) Nurse Communication: Mobility status PT Visit Diagnosis: Unsteadiness on feet (R26.81);Muscle weakness (generalized) (M62.81);Difficulty in walking, not elsewhere classified (R26.2)    Time: 0034-9179 PT Time Calculation (min) (ACUTE ONLY): 14 min   Charges:   PT Evaluation $PT Eval Moderate Complexity: 1 Mod          Reuel Derby, PT, DPT  Acute Rehabilitation  Services  Pager: [phone] Office: [phone]   Rudean Hitt 04/06/2020, 2:26 PM

## 2020-04-06 NOTE — H&P (Signed)
History and Physical    [name] OMV:[id number] DOB: August 09, 1929 DOA: 04/06/2020  PCP: Hulan Fess, MD Consultants:   Posey Pronto - podiatry; Gardiner Rhyme - cardiology Patient coming from: [place]; NOK:  Daughter, [name], [phone]   Chief Complaint: Syncope  HPI: [name] is a 87 y.o. female with medical history significant of prosthetic joint infection; remote ovarian CA; HTN; and HLD presenting with syncope.  The patient reports that she was feeling well yesterday but took her evening BP medication later than usual (3 -> 6pm) and took her usual Ambien.  She thinks she fell asleep in her recliner.  She got up to have a BM about 1245 and collapsed to the floor while on the commode.  She does not think she lost consciousness, but she struck her face and nose.  She was brought to the ER and felt somewhat dizzy when she need to go to the restroom; when she got out of bed she was increasingly dizzy and again became presyncopal. She did injure her R ankle with falling.   She was seen in the ER on 1/21 with diarrhea and presyncope.  It was a similar episode to now.  She was seen by her PCP on 2/21 and there was discussion about maybe needing to change her BP medications.    ED Course:   Syncope during defecation.  Fell, sutures.  Likely orthostatic.  Had another episode in the ER after going to the bathroom - ?concussion vs. Orthostatic.  Needs observation.  Review of Systems: As per HPI; otherwise review of systems reviewed and negative.   Ambulatory Status:  Ambulates with a walker  COVID Vaccine Status:  Complete  Past Medical History:  Diagnosis Date  . Anemia associated with acute blood loss 04/03/2011  . Arthritis    knees   . Ascites   . Blood transfusion    hx of 2011   . Complication of anesthesia    Sodium drops per pt   . Cystitis   . DIARRHEA, ANTIBIOTIC ASSOCIATED 05/31/2009   Qualifier: Diagnosis of  By: Tommy Medal MD, Roderic Scarce    . GERD  (gastroesophageal reflux disease)   . H/O hiatal hernia   . Hyperlipidemia   . Hypertension   . Hyponatremia   . Neutropenia with fever ([place]) 05/18/2011  . OSTEOMYELITIS, CHRONIC, LOWER LEG 04/23/2009   Qualifier: Diagnosis of  By: Johnnye Sima MD, Dellis Filbert    . Osteopenia   . OSTEOPOROSIS 04/23/2009   Qualifier: Diagnosis of  By: Johnnye Sima MD, Dellis Filbert    . Ovarian cancer ([place]) 01/25/2011  . Pneumonia    hx of   . Postmenopausal atrophic vaginitis   . PROSTHETIC JOINT COMPLICATION 6/54/6503   Qualifier: Diagnosis of  By: Tommy Medal MD, Roderic Scarce    . Renal disorder    Decreased kidney function  . Staph infection 2010   after knee replacement  . Urinary frequency   . Vulvitis     Past Surgical History:  Procedure Laterality Date  . ABDOMINAL HYSTERECTOMY  04/01/2011   Procedure: HYSTERECTOMY ABDOMINAL;  Surgeon: Alvino Chapel, MD;  Location: [facility];  Service: Gynecology;  Laterality: N/A;  . APPENDECTOMY    . JOINT REPLACEMENT     R knee in 2008, 5 operations on L knee  . LAPAROTOMY  04/01/2011   Procedure: EXPLORATORY LAPAROTOMY;  Surgeon: Alvino Chapel, MD;  Location: [facility];  Service: Gynecology;  Laterality: N/A;  . OTHER SURGICAL HISTORY  hx of C section 1966  . SALPINGOOPHORECTOMY  04/01/2011   Procedure: SALPINGO OOPHERECTOMY;  Surgeon: Alvino Chapel, MD;  Location: [facility];  Service: Gynecology;  Laterality: Bilateral;  . TONSILLECTOMY      Social History   Socioeconomic History  . Marital status: Widowed    Spouse name: Not on file  . Number of children: Not on file  . Years of education: Not on file  . Highest education level: Not on file  Occupational History  . Not on file  Tobacco Use  . Smoking status: Former Smoker    Quit date: 02/11/1952    Years since quitting: 68.1  . Smokeless tobacco: Never Used  Vaping Use  . Vaping Use: Never used  Substance and Sexual Activity  . Alcohol use: Not Currently  . Drug use: Never  . Sexual  activity: Never  Other Topics Concern  . Not on file  Social History Narrative  . Not on file   Social Determinants of Health   Financial Resource Strain: Not on file  Food Insecurity: Not on file  Transportation Needs: Not on file  Physical Activity: Not on file  Stress: Not on file  Social Connections: Not on file  Intimate Partner Violence: Not on file    Allergies  Allergen Reactions  . Morphine And Related Other (See Comments)    Given after knee replacement and code blue occurred  . Penicillins Hives and Itching    syncope  . Nsaids Other (See Comments)    abn kidney test  . Shellfish Allergy Nausea And Vomiting and Other (See Comments)    Pt has shellfish allergy only.  Has had IV contrast x 2 and did fine.    Family History  Problem Relation Age of Onset  . Cancer Other        Bladder cancer  . Heart attack Brother   . Diabetes Brother     Prior to Admission medications   Medication Sig Start Date End Date Taking? Authorizing Provider  amLODipine (NORVASC) 10 MG tablet Take 1 tablet (10 mg total) by mouth daily. At 1500 01/17/20   Donato Heinz, MD  azelastine (ASTELIN) 0.1 % nasal spray Place 1 spray into both nostrils 2 (two) times daily as needed for rhinitis or allergies.  12/06/15   [provider]  B Complex Vitamins (VITAMIN B COMPLEX PO) Take 1 tablet by mouth daily.     [provider]  calcium carbonate (TUMS EX) 750 MG chewable tablet Chew 1 tablet by mouth daily.    [provider]  Cholecalciferol (VITAMIN D) 2000 UNITS tablet Take 2,000 Units by mouth daily.     [provider]  docusate sodium (COLACE) 100 MG capsule Take 100 mg by mouth daily as needed for mild constipation.     [provider]  famotidine (PEPCID) 10 MG tablet Take 10 mg by mouth daily as needed for heartburn or indigestion.    [provider]  fenofibrate (TRICOR) 145 MG tablet Take 145 mg by mouth daily. with food  07/24/18   [provider]  lisinopril (ZESTRIL) 20 MG tablet Take 20 mg by mouth daily. 10/18/18   [provider]  Multiple Vitamins-Minerals (CENTRUM SILVER 50+MEN PO) Take 1 tablet by mouth daily    [provider]  nadolol (CORGARD) 20 MG tablet Take 20 mg by mouth daily.     [provider]  omeprazole (PRILOSEC) 20 MG capsule Take 1 capsule (20 mg  total) by mouth 2 (two) times daily before a meal. 10/10/19   Antonieta Pert, MD  Polyethyl Glycol-Propyl Glycol (SYSTANE) 0.4-0.3 % SOLN Apply 1 drop to eye daily as needed.     [provider]  RESTASIS 0.05 % ophthalmic emulsion Place 1 drop into both eyes in the morning and at bedtime. 09/02/19   [provider]  zolpidem (AMBIEN) 10 MG tablet Take 5-10 mg by mouth daily as needed for sleep.     [provider]    Physical Exam: Vitals:   04/06/20 0807 04/06/20 0830 04/06/20 1215 04/06/20 1250  BP: (!) 82/50 (!) 120/94 (!) 143/72 140/64  Pulse: 81 79 80 77  Resp:  18 19 14   Temp:      TempSrc:      SpO2: 98% 99% 98% 98%     . General:  Appears calm and comfortable and is in NAD; she has steristrips on facial lacs with facial swelling and bruising . Eyes:   EOMI, normal lids, iris . ENT:  grossly normal hearing, lips & tongue, mmm . Neck:  no LAD, masses or thyromegaly . Cardiovascular:  RRR, r/g, 3-8/9 systolic murmur that patient reports is chronic. No LE edema.  Marland Kitchen Respiratory:   CTA bilaterally with no wheezes/rales/rhonchi.  Normal respiratory effort. . Abdomen:  soft, NT, ND, NABS . Back:   normal alignment, no CVAT . Skin:  no rash or induration seen on limited exam . Musculoskeletal:  R lateral ankle edema and developing ecchymosis . Lower extremity:  No LE edema (other than as above).  Limited foot exam with no ulcerations.  2+ distal pulses. Marland Kitchen Psychiatric:  grossly normal mood and affect, speech fluent and appropriate, AOx3 . Neurologic:  CN 2-12 grossly intact, moves  all extremities in coordinated fashion    Radiological Exams on Admission: Independently reviewed - see discussion in A/P where applicable  DG Ankle Complete Right  Result Date: 04/06/2020 CLINICAL DATA:  Syncopal episode. EXAM: RIGHT ANKLE - COMPLETE 3+ VIEW COMPARISON:  June 09, 2017 FINDINGS: There is no evidence of an acute fracture, dislocation, or joint effusion. A chronic deformity is seen involving the distal shaft of the right fibula. There is a moderate-sized plantar calcaneal spur. Soft tissues are unremarkable. IMPRESSION: Chronic deformity of the distal right fibula. Electronically Signed   By: Virgina Norfolk M.D.   On: 04/06/2020 02:45   CT HEAD WO CONTRAST  Result Date: 04/06/2020 CLINICAL DATA:  Syncopal episode. EXAM: CT HEAD WITHOUT CONTRAST TECHNIQUE: Contiguous axial images were obtained from the base of the skull through the vertex without intravenous contrast. COMPARISON:  March 02, 2020 FINDINGS: Brain: There is mild cerebral atrophy with widening of the extra-axial spaces and ventricular dilatation. There are areas of decreased attenuation within the white matter tracts of the supratentorial brain, consistent with microvascular disease changes. A stable 1 cm para falcine calcified right occipital lobe meningioma is noted. Vascular: No hyperdense vessels are identified. Skull: Nondisplaced bilateral nasal bone fractures of indeterminate age are seen. Sinuses/Orbits: Marked severity sphenoid sinus mucosal thickening is present. Other: Mild to moderate severity frontal scalp soft tissue swelling is noted along the midline. IMPRESSION: 1. Mild to moderate severity frontal scalp soft tissue swelling without evidence of an acute fracture or acute intracranial abnormality. 2. Stable 1 cm para falcine calcified right occipital lobe meningioma. 3. Bilateral nondisplaced nasal bone fractures of indeterminate age. Electronically Signed   By: Virgina Norfolk M.D.   On: 04/06/2020  02:42  CT Maxillofacial Wo Contrast  Result Date: 04/06/2020 CLINICAL DATA:  Syncopal episode. EXAM: CT MAXILLOFACIAL WITHOUT CONTRAST TECHNIQUE: Multidetector CT imaging of the maxillofacial structures was performed. Multiplanar CT image reconstructions were also generated. COMPARISON:  None. FINDINGS: Osseous: Bilateral nondisplaced nasal bone fractures of indeterminate age are seen. Orbits: Negative. No traumatic or inflammatory finding. Sinuses: There is marked severity sphenoid sinus mucosal thickening. Soft tissues: Mild to moderate severity frontal scalp soft tissue swelling is seen, along the midline. Limited intracranial: No significant or unexpected finding. IMPRESSION: 1. Bilateral nondisplaced nasal bone fractures. 2. Mild to moderate severity frontal scalp soft tissue swelling. 3. Marked severity sphenoid sinus mucosal thickening. Electronically Signed   By: Virgina Norfolk M.D.   On: 04/06/2020 02:43    EKG: Independently reviewed.  NSR with rate 70; no evidence of acute ischemia   Labs on Admission: I have personally reviewed the available labs and imaging studies at the time of the admission.  Pertinent labs:   Na++ 131 Glucose 136 BUN 23/Creatinine 1.12/GFR 47 - stable HS troponin 8, 5 WBC 11.7   Assessment/Plan Principal Problem:   Syncope and collapse Active Problems:   Essential hypertension   Chronic kidney disease (CKD), stage III (moderate) (HCC)   Fall at home, initial encounter   Acute right ankle pain   Syncope -Most likely associated with orthostasis and/or micturitional syncope -Ambien may also be a contributing factor -Severity of the injury sustained during syncope does not correlate with the etiology of syncope, but rather is a manifestation of activity around the time of syncope. -Patients at highest risk from syncope include those with serious comorbidities; age >71; exertional > supine syncope; palpitations; abnormal EKG; abnormal vital signs; or  abnormal exam.  Her age is her only such RF. -Will observe overnight on telemetry in the hospital. -Orthostatic vital signs now and in AM -Negative troponins x2  -Will check echo -Neuro checks  -PT/OT eval and treat  Fall -Due to syncope -Has facial injuries that appear to be superficial/hemostatic at this time  R ankle pain -Injured in fall -No fracture on xray -Uses a walker at baseline and this is likely to further limit her mobility -CAM walker requested -PT/OT consults -May need at least short-term escalation of care to SNF - [facility] consult requested  HTN -Hold BP medications for now due to concern for orthostasis as the cause for her syncope -She usually takes Norvasc, lisinopril, and nadolol -Her BP goal should likely be <160 based on age -Checking orthostatics  Stage 3a CKD -Appears to be stable at this time   Note: This patient has been tested and is pending for the novel coronavirus COVID-19. The patient has been fully vaccinated against COVID-19.   DVT prophylaxis: Lovenox  Code Status:  Full - confirmed with patient/family Family Communication: Daughter was present throughout evaluation Disposition Plan:  The patient is from: home (ILF)  Anticipated d/c is to: ILF vs. Short-term SNF rehab at [facility]  Anticipated d/c date will depend on clinical response to treatment, but may be as early as tomorrow with good response  Patient is currently: acutely ill Consults called: PT/OT/TOC team Admission status: It is my clinical opinion that referral for OBSERVATION is reasonable and necessary in this patient based on the above information provided. The aforementioned taken together are felt to place the patient at high risk for further clinical deterioration. However it is anticipated that the patient may be medically stable for discharge from the hospital within 24 to  48 hours.     Karmen Bongo MD [facility]   How to contact the [facility] Attending or  Consulting provider [place] or covering provider during after hours [place], for this patient?  1. Check the care team in [facility] and look for a) attending/consulting [facility] provider listed and b) the [facility] team listed 2. Log into [email or web address] and use [place]'s universal password to access. If you do not have the password, please contact the hospital operator. 3. Locate the [facility] provider you are looking for under [facility] and page to a number that you can be directly reached. 4. If you still have difficulty reaching the provider, please page the [facility] (Director on Call) for the Hospitalists listed on amion for assistance.   04/06/2020, 1:30 PM

## 2020-04-06 NOTE — ED Notes (Signed)
Moved pt to toilet for BM, pt became very dizzy and lighted. BP dropped to 89/53 and pt had episode of small emesis.  Able to transfer pt back into bed and states feeling better after vomiting and lying down  Provider aware .

## 2020-04-06 NOTE — Discharge Planning (Signed)
RNCM met with daughter, Almyra Free and pt at bedside regarding disposition plans.  Pt and Almyra Free are requesting higher level of care at living facility.  RNCM explained process of being evaluated by Physical Therapy (PT) for recommendations before proceeding with request for skilled nursing facility.  Almyra Free and Pt verbalized understanding through teach back.

## 2020-04-06 NOTE — Progress Notes (Signed)
  Echocardiogram 2D Echocardiogram has been performed.  [name] 04/06/2020, 2:36 PM

## 2020-04-06 NOTE — ED Provider Notes (Signed)
Care transferred to me. After patient's 2nd Liter of fluid they got her up to go to the bathroom. After a BM on the way back in the wheelchair she got lightheaded, hypotensive and passed out with vomiting. She is better now but at this point I think she needs an observation. Discussed with Dr. Lorin Mercy for admission   Sherwood Gambler, MD 04/06/20 [id number]

## 2020-04-07 DIAGNOSIS — S0121XA Laceration without foreign body of nose, initial encounter: Secondary | ICD-10-CM | POA: Diagnosis not present

## 2020-04-07 DIAGNOSIS — Z9079 Acquired absence of other genital organ(s): Secondary | ICD-10-CM | POA: Diagnosis not present

## 2020-04-07 DIAGNOSIS — R279 Unspecified lack of coordination: Secondary | ICD-10-CM | POA: Diagnosis not present

## 2020-04-07 DIAGNOSIS — I700 Atherosclerosis of aorta: Secondary | ICD-10-CM | POA: Diagnosis not present

## 2020-04-07 DIAGNOSIS — K5901 Slow transit constipation: Secondary | ICD-10-CM | POA: Diagnosis present

## 2020-04-07 DIAGNOSIS — I951 Orthostatic hypotension: Secondary | ICD-10-CM | POA: Diagnosis not present

## 2020-04-07 DIAGNOSIS — Z8249 Family history of ischemic heart disease and other diseases of the circulatory system: Secondary | ICD-10-CM | POA: Diagnosis not present

## 2020-04-07 DIAGNOSIS — Z743 Need for continuous supervision: Secondary | ICD-10-CM | POA: Diagnosis not present

## 2020-04-07 DIAGNOSIS — W1830XA Fall on same level, unspecified, initial encounter: Secondary | ICD-10-CM | POA: Diagnosis present

## 2020-04-07 DIAGNOSIS — M25571 Pain in right ankle and joints of right foot: Secondary | ICD-10-CM | POA: Diagnosis present

## 2020-04-07 DIAGNOSIS — I129 Hypertensive chronic kidney disease with stage 1 through stage 4 chronic kidney disease, or unspecified chronic kidney disease: Secondary | ICD-10-CM | POA: Diagnosis not present

## 2020-04-07 DIAGNOSIS — Z20822 Contact with and (suspected) exposure to covid-19: Secondary | ICD-10-CM | POA: Diagnosis not present

## 2020-04-07 DIAGNOSIS — E785 Hyperlipidemia, unspecified: Secondary | ICD-10-CM | POA: Diagnosis not present

## 2020-04-07 DIAGNOSIS — S060X9A Concussion with loss of consciousness of unspecified duration, initial encounter: Secondary | ICD-10-CM | POA: Diagnosis not present

## 2020-04-07 DIAGNOSIS — S0181XA Laceration without foreign body of other part of head, initial encounter: Secondary | ICD-10-CM | POA: Diagnosis not present

## 2020-04-07 DIAGNOSIS — I10 Essential (primary) hypertension: Secondary | ICD-10-CM | POA: Diagnosis not present

## 2020-04-07 DIAGNOSIS — Z90710 Acquired absence of both cervix and uterus: Secondary | ICD-10-CM | POA: Diagnosis not present

## 2020-04-07 DIAGNOSIS — R5381 Other malaise: Secondary | ICD-10-CM | POA: Diagnosis not present

## 2020-04-07 DIAGNOSIS — E871 Hypo-osmolality and hyponatremia: Secondary | ICD-10-CM | POA: Diagnosis not present

## 2020-04-07 DIAGNOSIS — N1831 Chronic kidney disease, stage 3a: Secondary | ICD-10-CM | POA: Diagnosis not present

## 2020-04-07 DIAGNOSIS — M810 Age-related osteoporosis without current pathological fracture: Secondary | ICD-10-CM | POA: Diagnosis not present

## 2020-04-07 DIAGNOSIS — Z79899 Other long term (current) drug therapy: Secondary | ICD-10-CM | POA: Diagnosis not present

## 2020-04-07 DIAGNOSIS — K219 Gastro-esophageal reflux disease without esophagitis: Secondary | ICD-10-CM | POA: Diagnosis not present

## 2020-04-07 DIAGNOSIS — M7989 Other specified soft tissue disorders: Secondary | ICD-10-CM | POA: Diagnosis not present

## 2020-04-07 DIAGNOSIS — M19071 Primary osteoarthritis, right ankle and foot: Secondary | ICD-10-CM | POA: Diagnosis not present

## 2020-04-07 DIAGNOSIS — Z87891 Personal history of nicotine dependence: Secondary | ICD-10-CM | POA: Diagnosis not present

## 2020-04-07 DIAGNOSIS — N183 Chronic kidney disease, stage 3 unspecified: Secondary | ICD-10-CM | POA: Diagnosis not present

## 2020-04-07 DIAGNOSIS — Z90722 Acquired absence of ovaries, bilateral: Secondary | ICD-10-CM | POA: Diagnosis not present

## 2020-04-07 DIAGNOSIS — Z96653 Presence of artificial knee joint, bilateral: Secondary | ICD-10-CM | POA: Diagnosis present

## 2020-04-07 DIAGNOSIS — E669 Obesity, unspecified: Secondary | ICD-10-CM | POA: Diagnosis not present

## 2020-04-07 DIAGNOSIS — Z8543 Personal history of malignant neoplasm of ovary: Secondary | ICD-10-CM | POA: Diagnosis not present

## 2020-04-07 DIAGNOSIS — C569 Malignant neoplasm of unspecified ovary: Secondary | ICD-10-CM | POA: Diagnosis not present

## 2020-04-07 DIAGNOSIS — R55 Syncope and collapse: Secondary | ICD-10-CM | POA: Diagnosis not present

## 2020-04-07 DIAGNOSIS — M7731 Calcaneal spur, right foot: Secondary | ICD-10-CM | POA: Diagnosis not present

## 2020-04-07 DIAGNOSIS — Z886 Allergy status to analgesic agent status: Secondary | ICD-10-CM | POA: Diagnosis not present

## 2020-04-07 DIAGNOSIS — D62 Acute posthemorrhagic anemia: Secondary | ICD-10-CM | POA: Diagnosis not present

## 2020-04-07 LAB — CBC
HCT: 30.9 % — ABNORMAL LOW (ref 36.0–46.0)
Hemoglobin: 10.9 g/dL — ABNORMAL LOW (ref 12.0–15.0)
MCH: 29.1 pg (ref 26.0–34.0)
MCHC: 35.3 g/dL (ref 30.0–36.0)
MCV: 82.4 fL (ref 80.0–100.0)
Platelets: 225 10*3/uL (ref 150–400)
RBC: 3.75 MIL/uL — ABNORMAL LOW (ref 3.87–5.11)
RDW: 15.3 % (ref 11.5–15.5)
WBC: 8.8 10*3/uL (ref 4.0–10.5)
nRBC: 0 % (ref 0.0–0.2)

## 2020-04-07 LAB — BASIC METABOLIC PANEL
Anion gap: 8 (ref 5–15)
BUN: 12 mg/dL (ref 8–23)
CO2: 20 mmol/L — ABNORMAL LOW (ref 22–32)
Calcium: 8.5 mg/dL — ABNORMAL LOW (ref 8.9–10.3)
Chloride: 102 mmol/L (ref 98–111)
Creatinine, Ser: 0.75 mg/dL (ref 0.44–1.00)
GFR, Estimated: >60 mL/min (ref >60)
Glucose, Bld: 118 mg/dL — ABNORMAL HIGH (ref 70–99)
Potassium: 4 mmol/L (ref 3.5–5.1)
Sodium: 130 mmol/L — ABNORMAL LOW (ref 135–145)

## 2020-04-07 MED ORDER — DOCUSATE SODIUM 100 MG PO CAPS
100.0000 mg | ORAL_CAPSULE | Freq: Two times a day (BID) | ORAL | Status: DC
  Administered 2020-04-07 – 2020-04-11 (×8): 100 mg via ORAL
  Filled 2020-04-07 (×9): qty 1

## 2020-04-07 MED ORDER — DICLOFENAC SODIUM 1 % EX GEL
2.0000 g | Freq: Four times a day (QID) | CUTANEOUS | Status: DC
  Administered 2020-04-07 – 2020-04-11 (×11): 2 g via TOPICAL
  Filled 2020-04-07: qty 100

## 2020-04-07 MED ORDER — B COMPLEX-C PO TABS
1.0000 | ORAL_TABLET | Freq: Every day | ORAL | Status: DC
  Administered 2020-04-07 – 2020-04-11 (×5): 1 via ORAL
  Filled 2020-04-07 (×5): qty 1

## 2020-04-07 MED ORDER — VITAMIN D 25 MCG (1000 UNIT) PO TABS
2000.0000 [IU] | ORAL_TABLET | Freq: Every day | ORAL | Status: DC
  Administered 2020-04-07 – 2020-04-11 (×5): 2000 [IU] via ORAL
  Filled 2020-04-07 (×5): qty 2

## 2020-04-07 MED ORDER — ADULT MULTIVITAMIN W/MINERALS CH
1.0000 | ORAL_TABLET | Freq: Every day | ORAL | Status: DC
  Administered 2020-04-07 – 2020-04-11 (×5): 1 via ORAL
  Filled 2020-04-07 (×5): qty 1

## 2020-04-07 MED ORDER — ACETAMINOPHEN 500 MG PO TABS
1000.0000 mg | ORAL_TABLET | Freq: Three times a day (TID) | ORAL | Status: DC
  Administered 2020-04-07 – 2020-04-10 (×6): 1000 mg via ORAL
  Administered 2020-04-11: 500 mg via ORAL
  Filled 2020-04-07 (×12): qty 2

## 2020-04-07 NOTE — Progress Notes (Signed)
Progress Note    RAYANNE PADMANABHAN  YQM:[id number] DOB: 1929/08/28  DOA: 04/06/2020 PCP: Hulan Fess, MD    Brief Narrative:     Medical records reviewed and are as summarized below:  NEISHA HINGER is an 87 y.o. female with medical history significant of prosthetic joint infection; remote ovarian CA; HTN; and HLD presenting with syncope.  The patient reports that she was feeling well yesterday but took her evening BP medication later than usual (3 -> 6pm) and took her usual Ambien.  She thinks she fell asleep in her recliner.  She got up to have a BM about 1245 and collapsed to the floor while on the commode.  She does not think she lost consciousness, but she struck her face and nose.  She was brought to the ER and felt somewhat dizzy when she need to go to the restroom; when she got out of bed she was increasingly dizzy and again became presyncopal. She did injure her R ankle with falling.   She was seen in the ER on 1/21 with diarrhea and presyncope.  It was a similar episode to now.  She was seen by her PCP on 2/21 and there was discussion about maybe needing to change her BP medications.  Assessment/Plan:   Principal Problem:   Syncope and collapse Active Problems:   Essential hypertension   Chronic kidney disease (CKD), stage III (moderate) (HCC)   Fall at home, initial encounter   Acute right ankle pain   Orthostatic hypotension   Syncope -Most likely associated with orthostasis and/or micturitional syncope -Ambien may also be a contributing factor -Patients at highest risk from syncope include those with serious comorbidities; age >60; exertional > supine syncope; palpitations; abnormal EKG; abnormal vital signs; or abnormal exam.  Her age is her only such RF. -orthos daily -Negative troponins x2  -echo similar to prior -TSH ok, check cortisol  Fall -Due to syncope -Has facial injuries that appear to be superficial/hemostatic at this time  R ankle  pain -Injured in fall -No fracture on xray -recent sprain in Jan -- seen by podiatry -daughter to bring in brace -PT/OT consults -will need SNF  HTN -Hold BP medications for now due to concern for orthostasis as the cause for her syncope -She usually takes Norvasc, lisinopril, and nadolol -Her BP goal should likely be <160 based on age -orthos +  Stage 3a CKD -Appears to be stable at this time   Family Communication/Anticipated D/C date and plan/Code Status   DVT prophylaxis: Lovenox ordered. Code Status: Full Code.  Family Communication: called daughter Disposition Plan: Status is: Inpatient  Remains inpatient appropriate because:Unsafe d/c plan and Inpatient level of care appropriate due to severity of illness   Dispo: The patient is from: Home              Anticipated d/c is to: SNF              Patient currently is not medically stable to d/c.   Difficult to place patient No         Medical Consultants:    None.     Subjective:   Asking for her vitamins  Objective:    Vitals:   04/06/20 2026 04/07/20 0014 04/07/20 0356 04/07/20 0505  BP: (!) 150/63 (!) 153/73 (!) 159/72   Pulse: 97 90 91   Resp: 18 18 18    Temp: 98.8 F (37.1 C) 99.4 F (37.4 C) 99.3 F (37.4 C)  TempSrc: Oral Oral Oral   SpO2: 97% 96% 95%   Weight:    78.3 kg  Height:        Intake/Output Summary (Last 24 hours) at 04/07/2020 1138 Last data filed at 04/07/2020 6283 Gross per 24 hour  Intake 137.75 ml  Output 1750 ml  Net -1612.25 ml   Filed Weights   04/06/20 1629 04/07/20 0505  Weight: 76.8 kg 78.3 kg    Exam:  General: Appearance:    Obese female in no acute distress     Lungs:     respirations unlabored  Heart:    Normal heart rate. Normal rhythm. No murmurs, rubs, or gallops.   MS:   All extremities are intact. Right ankle tender to palpation  Neurologic:   Awake, alert    Data Reviewed:   I have personally reviewed following labs and imaging  studies:  Labs: Labs show the following:   Basic Metabolic Panel: Recent Labs  Lab 04/06/20 0207 04/07/20 0220  NA 131* 130*  K 4.6 4.0  CL 100 102  CO2 20* 20*  GLUCOSE 136* 118*  BUN 23 12  CREATININE 1.12* 0.75  CALCIUM 9.4 8.5*   GFR Estimated Creatinine Clearance: 48.2 mL/min (by C-G formula based on SCr of 0.75 mg/dL). Liver Function Tests: No results for input(s): AST, ALT, ALKPHOS, BILITOT, PROT, ALBUMIN in the last 168 hours. No results for input(s): LIPASE, AMYLASE in the last 168 hours. No results for input(s): AMMONIA in the last 168 hours. Coagulation profile No results for input(s): INR, PROTIME in the last 168 hours.  CBC: Recent Labs  Lab 04/06/20 0207 04/07/20 0220  WBC 11.7* 8.8  NEUTROABS 9.2*  --   HGB 14.1 10.9*  HCT 42.6 30.9*  MCV 86.6 82.4  PLT 281 225   Cardiac Enzymes: No results for input(s): CKTOTAL, CKMB, CKMBINDEX, TROPONINI in the last 168 hours. BNP (last 3 results) No results for input(s): PROBNP in the last 8760 hours. CBG: No results for input(s): GLUCAP in the last 168 hours. D-Dimer: No results for input(s): DDIMER in the last 72 hours. Hgb A1c: No results for input(s): HGBA1C in the last 72 hours. Lipid Profile: No results for input(s): CHOL, HDL, LDLCALC, TRIG, CHOLHDL, LDLDIRECT in the last 72 hours. Thyroid function studies: Recent Labs    04/06/20 1234  TSH 2.357   Anemia work up: No results for input(s): VITAMINB12, FOLATE, FERRITIN, TIBC, IRON, RETICCTPCT in the last 72 hours. Sepsis Labs: Recent Labs  Lab 04/06/20 0207 04/07/20 0220  WBC 11.7* 8.8    Microbiology Recent Results (from the past 240 hour(s))  SARS CORONAVIRUS 2 (TAT 6-24 HRS) Nasopharyngeal Nasopharyngeal Swab     Status: None   Collection Time: 04/06/20 10:34 AM   Specimen: Nasopharyngeal Swab  Result Value Ref Range Status   SARS Coronavirus 2 NEGATIVE NEGATIVE Final    Comment: (NOTE) SARS-CoV-2 target nucleic acids are NOT  DETECTED.  The SARS-CoV-2 RNA is generally detectable in upper and lower respiratory specimens during the acute phase of infection. Negative results do not preclude SARS-CoV-2 infection, do not rule out co-infections with other pathogens, and should not be used as the sole basis for treatment or other patient management decisions. Negative results must be combined with clinical observations, patient history, and epidemiological information. The expected result is Negative.  Fact Sheet for Patients: SugarRoll.be  Fact Sheet for Healthcare Providers: https://www.woods-mathews.com/  This test is not yet approved or cleared by the [place] FDA and  has been authorized for detection and/or diagnosis of SARS-CoV-2 by FDA under an Emergency Use Authorization (EUA). This EUA will remain  in effect (meaning this test can be used) for the duration of the COVID-19 declaration under Se ction 564(b)(1) of the Act, 21 U.S.C. section 360bbb-3(b)(1), unless the authorization is terminated or revoked sooner.  Performed at [facility], [place] [address] [place], [place] [address]     Procedures and diagnostic studies:  DG Ankle Complete Right  Result Date: 04/06/2020 CLINICAL DATA:  Syncopal episode. EXAM: RIGHT ANKLE - COMPLETE 3+ VIEW COMPARISON:  June 09, 2017 FINDINGS: There is no evidence of an acute fracture, dislocation, or joint effusion. A chronic deformity is seen involving the distal shaft of the right fibula. There is a moderate-sized plantar calcaneal spur. Soft tissues are unremarkable. IMPRESSION: Chronic deformity of the distal right fibula. Electronically Signed   By: Virgina Norfolk M.D.   On: 04/06/2020 02:45   CT HEAD WO CONTRAST  Result Date: 04/06/2020 CLINICAL DATA:  Syncopal episode. EXAM: CT HEAD WITHOUT CONTRAST TECHNIQUE: Contiguous axial images were obtained from the base of the skull through the vertex without  intravenous contrast. COMPARISON:  March 02, 2020 FINDINGS: Brain: There is mild cerebral atrophy with widening of the extra-axial spaces and ventricular dilatation. There are areas of decreased attenuation within the white matter tracts of the supratentorial brain, consistent with microvascular disease changes. A stable 1 cm para falcine calcified right occipital lobe meningioma is noted. Vascular: No hyperdense vessels are identified. Skull: Nondisplaced bilateral nasal bone fractures of indeterminate age are seen. Sinuses/Orbits: Marked severity sphenoid sinus mucosal thickening is present. Other: Mild to moderate severity frontal scalp soft tissue swelling is noted along the midline. IMPRESSION: 1. Mild to moderate severity frontal scalp soft tissue swelling without evidence of an acute fracture or acute intracranial abnormality. 2. Stable 1 cm para falcine calcified right occipital lobe meningioma. 3. Bilateral nondisplaced nasal bone fractures of indeterminate age. Electronically Signed   By: Virgina Norfolk M.D.   On: 04/06/2020 02:42   ECHOCARDIOGRAM COMPLETE  Result Date: 04/06/2020    ECHOCARDIOGRAM REPORT   Patient Name:   MEYGAN KYSER [facility] Date of Exam: 04/06/2020 Medical Rec #:  [id number]        Height:       63.0 in Accession #:    [id number]       Weight:       155.0 lb Date of Birth:  02-Jun-1929        BSA:          1.735 m Patient Age:    4 years         BP:           140/64 mmHg Patient Gender: F                HR:           84 bpm. Exam Location:  Inpatient Procedure: 2D Echo, Cardiac Doppler and Color Doppler Indications:    R55 Syncope  History:        Patient has prior history of Echocardiogram examinations, most                 recent 01/04/2019. Signs/Symptoms:Murmur, Shortness of Breath                 and Dyspnea; Risk Factors:Hypertension, Dyslipidemia and Former                 Smoker.  Sonographer:    Roseanna Rainbow RDCS Referring Phys: [id number] JENNIFER YATES  Sonographer Comments:  Technically difficult study due to poor echo windows. Patient had recent fall, could not turn. Patient sensitive to pressure from probe in apical region. IMPRESSIONS  1. Left ventricular ejection fraction, by estimation, is 70 to 75%. The left ventricle has hyperdynamic function. The left ventricle has no regional wall motion abnormalities. There is mild left ventricular hypertrophy. Left ventricular diastolic parameters are consistent with Grade I diastolic dysfunction (impaired relaxation).  2. Right ventricular systolic function is normal. The right ventricular size is normal.  3. The mitral valve is normal in structure. No evidence of mitral valve regurgitation. No evidence of mitral stenosis.  4. The aortic valve is tricuspid. Aortic valve regurgitation is mild. Mild aortic valve stenosis. Aortic valve area, by VTI measures 1.91 cm. Aortic valve mean gradient measures 10.0 mmHg. Aortic valve Vmax measures 2.12 m/s.  5. The inferior vena cava is normal in size with greater than 50% respiratory variability, suggesting right atrial pressure of 3 mmHg. FINDINGS  Left Ventricle: Left ventricular ejection fraction, by estimation, is 70 to 75%. The left ventricle has hyperdynamic function. The left ventricle has no regional wall motion abnormalities. The left ventricular internal cavity size was normal in size. There is mild left ventricular hypertrophy. Left ventricular diastolic parameters are consistent with Grade I diastolic dysfunction (impaired relaxation). Right Ventricle: The right ventricular size is normal. No increase in right ventricular wall thickness. Right ventricular systolic function is normal. Left Atrium: Left atrial size was normal in size. Right Atrium: Right atrial size was normal in size. Pericardium: There is no evidence of pericardial effusion. Mitral Valve: The mitral valve is normal in structure. Mild mitral annular calcification. No evidence of mitral valve regurgitation. No evidence of  mitral valve stenosis. Tricuspid Valve: The tricuspid valve is normal in structure. Tricuspid valve regurgitation is not demonstrated. No evidence of tricuspid stenosis. Aortic Valve: The aortic valve is tricuspid. Aortic valve regurgitation is mild. Aortic regurgitation PHT measures 393 msec. Mild aortic stenosis is present. Aortic valve mean gradient measures 10.0 mmHg. Aortic valve peak gradient measures 18.0 mmHg. Aortic valve area, by VTI measures 1.91 cm. Pulmonic Valve: The pulmonic valve was normal in structure. Pulmonic valve regurgitation is not visualized. No evidence of pulmonic stenosis. Aorta: The aortic root is normal in size and structure. Venous: The inferior vena cava is normal in size with greater than 50% respiratory variability, suggesting right atrial pressure of 3 mmHg. IAS/Shunts: No atrial level shunt detected by color flow Doppler.  LEFT VENTRICLE PLAX 2D LVIDd:         3.40 cm     Diastology LVIDs:         2.00 cm     LV e' medial:    6.96 cm/s LV PW:         1.10 cm     LV E/e' medial:  14.2 LV IVS:        1.30 cm     LV e' lateral:   4.90 cm/s LVOT diam:     1.90 cm     LV E/e' lateral: 20.1 LV SV:         93 LV SV Index:   54 LVOT Area:     2.84 cm  LV Volumes (MOD) LV vol d, MOD A2C: 44.9 ml LV vol d, MOD A4C: 56.2 ml LV vol s, MOD A2C: 9.0 ml LV vol s, MOD A4C: 13.8 ml LV  SV MOD A2C:     35.9 ml LV SV MOD A4C:     56.2 ml LV SV MOD BP:      39.8 ml RIGHT VENTRICLE             IVC RV S prime:     20.00 cm/s  IVC diam: 1.90 cm TAPSE (M-mode): 3.2 cm LEFT ATRIUM             Index LA diam:        4.80 cm 2.77 cm/m LA Vol (A2C):   42.8 ml 24.67 ml/m LA Vol (A4C):   36.8 ml 21.21 ml/m LA Biplane Vol: 41.4 ml 23.86 ml/m  AORTIC VALVE AV Area (Vmax):    2.55 cm AV Area (Vmean):   2.25 cm AV Area (VTI):     1.91 cm AV Vmax:           212.00 cm/s AV Vmean:          150.000 cm/s AV VTI:            0.486 m AV Peak Grad:      18.0 mmHg AV Mean Grad:      10.0 mmHg LVOT Vmax:          191.00 cm/s LVOT Vmean:        119.000 cm/s LVOT VTI:          0.328 m LVOT/AV VTI ratio: 0.67 AI PHT:            393 msec  AORTA Ao Root diam: 3.10 cm Ao Asc diam:  3.60 cm MITRAL VALVE MV Area (PHT): 3.72 cm     SHUNTS MV Decel Time: 204 msec     Systemic VTI:  0.33 m MV E velocity: 98.50 cm/s   Systemic Diam: 1.90 cm MV A velocity: 104.00 cm/s MV E/A ratio:  0.95 Candee Furbish MD Electronically signed by Candee Furbish MD Signature Date/Time: 04/06/2020/3:25:34 PM    Final    CT Maxillofacial Wo Contrast  Result Date: 04/06/2020 CLINICAL DATA:  Syncopal episode. EXAM: CT MAXILLOFACIAL WITHOUT CONTRAST TECHNIQUE: Multidetector CT imaging of the maxillofacial structures was performed. Multiplanar CT image reconstructions were also generated. COMPARISON:  None. FINDINGS: Osseous: Bilateral nondisplaced nasal bone fractures of indeterminate age are seen. Orbits: Negative. No traumatic or inflammatory finding. Sinuses: There is marked severity sphenoid sinus mucosal thickening. Soft tissues: Mild to moderate severity frontal scalp soft tissue swelling is seen, along the midline. Limited intracranial: No significant or unexpected finding. IMPRESSION: 1. Bilateral nondisplaced nasal bone fractures. 2. Mild to moderate severity frontal scalp soft tissue swelling. 3. Marked severity sphenoid sinus mucosal thickening. Electronically Signed   By: Virgina Norfolk M.D.   On: 04/06/2020 02:43    Medications:   . acetaminophen  1,000 mg Oral TID  . B-complex with vitamin C  1 tablet Oral Daily  . cholecalciferol  2,000 Units Oral Daily  . cycloSPORINE  1 drop Both Eyes BID  . diclofenac Sodium  2 g Topical QID  . docusate sodium  100 mg Oral BID  . enoxaparin (LOVENOX) injection  30 mg Subcutaneous Q24H  . multivitamin with minerals  1 tablet Oral Daily  . sodium chloride flush  3 mL Intravenous Q12H   Continuous Infusions: . lactated ringers 75 mL/hr at 04/07/20 0400     LOS: 0 days   Geradine Girt  [facility]   How to contact the [employer] Attending or Consulting provider [place] or  covering provider during after hours [place], for this patient?  1. Check the care team in [facility] and look for a) attending/consulting [facility] provider listed and b) the [facility] team listed 2. Log into [email or web address] and use [facility] universal password to access. If you do not have the password, please contact the hospital operator. 3. Locate the [facility] provider you are looking for under [facility] and page to a number that you can be directly reached. 4. If you still have difficulty reaching the provider, please page the [facility] (Director on Call) for the Hospitalists listed on amion for assistance.  04/07/2020, 11:38 AM

## 2020-04-07 NOTE — Evaluation (Addendum)
Occupational Therapy Evaluation Patient Details Name: [name] MRN: [id number] DOB: 1929/08/31 Today's Date: 04/07/2020    History of Present Illness Pt is an 87 y/o female admitted following syncopal episode at [facility]. Pt also had another episode while in ED after using bathroom. Pt complaining of R ankle pain, however, imaging negative. Per notes, pt with possible concussion. PMH includes ovarian CA; HTN; and HLD.   Clinical Impression   Pt PTA: Pt living in ILF. And reports independence with rollator. Pt currently, pt severely limited by pain in RLE. Pt minA to maxA for ADL at this time. Pt sit to stand not able due to R ankle pain; lateral scooting modA; bed mobility minguardA with HOB elevated. Pt refused to wear CAM boot and "does not trust this walker- I need my rollator."  BP continues to be orthostatic: supine 154/64m 88 HR; sit 114/85, 96 BPM; standing unable to obtain due to lack of standing tolerance from pain. Pt would benefit from continue for ADL, mobility and safety. OT following acutely.     Follow Up Recommendations  SNF;Supervision/Assistance - 24 hour    Equipment Recommendations  3 in 1 bedside commode    Recommendations for Other Services       Precautions / Restrictions Restrictions Weight Bearing Restrictions: No      Mobility Bed Mobility Overal bed mobility: Needs Assistance Bed Mobility: Supine to Sit;Sit to Supine;Rolling Rolling: Min guard   Supine to sit: Min guard Sit to supine: Supervision   General bed mobility comments: MinguardA with HOB elevated and for support with RLE.    Transfers Overall transfer level: Needs assistance Equipment used: 1 person hand held assist Transfers: Sit to/from Stand;Lateral/Scoot Transfers Sit to Stand: Mod assist;From elevated surface        Lateral/Scoot Transfers: Mod assist General transfer comment: ModA for sit to stand, but unable to bear weight on RLE without severe pain; pt scooting with modA  overall towards HOB, but fatigues quickly    Balance Overall balance assessment: Needs assistance Sitting-balance support: No upper extremity supported Sitting balance-Leahy Scale: Fair     Standing balance support: Bilateral upper extremity supported Standing balance-Leahy Scale: Poor Standing balance comment: reliant on RW, unable to bear weight on RLE without severe pain                           ADL either performed or assessed with clinical judgement   ADL Overall ADL's : Needs assistance/impaired Eating/Feeding: Set up;Sitting;Bed level   Grooming: Set up;Sitting;Bed level   Upper Body Bathing: Minimal assistance;Sitting   Lower Body Bathing: Maximal assistance;Sitting/lateral leans;Bed level   Upper Body Dressing : Minimal assistance;Sitting   Lower Body Dressing: Maximal assistance;Sitting/lateral leans;Bed level   Toilet Transfer: Moderate assistance;+2 for physical assistance;With caregiver independent Cabin crew Details (indicate cue type and reason): lateral scoot; pt unable to stand Toileting- Clothing Manipulation and Hygiene: Maximal assistance;Sitting/lateral lean;Bed level Toileting - Clothing Manipulation Details (indicate cue type and reason): using pure wick     Functional mobility during ADLs: Moderate assistance;Rolling walker;Cueing for safety;Cueing for sequencing (pt unable to stand; modA for scooting to L and R) General ADL Comments: Pt severely limited by pain in RLE. pt minA to maxA for ADL at this time. Pt refused to wear CAM walker boot at this time stating "I see a  podiatrist already- I'll see him when I get home." Pt stating "I know that I will go to the  healthcare part of the building for a few days before I return to my apartment." Pt perseverating on situation and hard to decreased awareness of current deficits and safety. Pt scooting towards HOB and recliner, but stating "do I have to do this? I don't want to end up  there" (pointing to recliner)     Vision Baseline Vision/History: No visual deficits Patient Visual Report: No change from baseline Vision Assessment?: No apparent visual deficits Additional Comments: continue to assess     Perception     Praxis      Pertinent Vitals/Pain Pain Assessment: 0-10 Pain Score: 9  Pain Location: R ankle Pain Descriptors / Indicators: Grimacing;Guarding;Shooting Pain Intervention(s): Limited activity within patient's tolerance;Monitored during session;Repositioned     Hand Dominance Right   Extremity/Trunk Assessment Upper Extremity Assessment Upper Extremity Assessment: Generalized weakness   Lower Extremity Assessment Lower Extremity Assessment: Generalized weakness;RLE deficits/detail RLE Deficits / Details: pain in R ankle, some swelling noted; arthritic changes negative ankle fx RLE: Unable to fully assess due to pain   Cervical / Trunk Assessment Cervical / Trunk Assessment: Kyphotic   Communication Communication Communication: HOH   Cognition Arousal/Alertness: Awake/alert Behavior During Therapy: WFL for tasks assessed/performed Overall Cognitive Status: No family/caregiver present to determine baseline cognitive functioning                                 General Comments: Short term memory deficits noted as pt did not recall fall in hospital could only recall fall in room at ILF. Pt perseverating on R ankle pain, only wanting to use rollator and that she did not need occupational therapy because "I don't do any cooking at home."   General Comments  BP continues to be orthostatic: supine 154/31m 88 HR; sit 114/85, 96 BPM; standing unable to obtain due to lack of standing tolerance from pain    Exercises     Shoulder Instructions      Home Living Family/patient expects to be discharged to:: Skilled nursing facility                                 Additional Comments: Pt from ILF at baseline and is  very independent with rolator. Pt reports that she does not do any cooking      Prior Functioning/Environment Level of Independence: Independent with assistive device(s)        Comments: rollator        OT Problem List: Decreased strength;Decreased activity tolerance;Impaired balance (sitting and/or standing);Decreased coordination;Decreased safety awareness;Pain;Increased edema;Decreased cognition      OT Treatment/Interventions: Self-care/ADL training;Therapeutic exercise;Neuromuscular education;Energy conservation;Cognitive remediation/compensation;Therapeutic activities;Visual/perceptual remediation/compensation;Balance training    OT Goals(Current goals can be found in the care plan section) Acute Rehab OT Goals Patient Stated Goal: my R ankle to be in less pain OT Goal Formulation: With patient Time For Goal Achievement: 04/21/20 Potential to Achieve Goals: Fair ADL Goals Pt Will Perform Lower Body Dressing: with min assist;sit to/from stand Pt Will Transfer to Toilet: with min guard assist;stand pivot transfer;bedside commode Pt Will Perform Toileting - Clothing Manipulation and hygiene: with min guard assist;sitting/lateral leans;sit to/from stand Additional ADL Goal #1: Pt will participate in x10 mins of OOB ADL tasks with 2 seated rest breaks in order to increase independence. Additional ADL Goal #2: Pt will participate in higher level cognition to assess safety for medication management.  OT Frequency: Min 2X/week   Barriers to D/C:            Co-evaluation              AM-PAC OT "6 Clicks" Daily Activity     Outcome Measure Help from another person eating meals?: None Help from another person taking care of personal grooming?: A Little Help from another person toileting, which includes using toliet, bedpan, or urinal?: A Lot Help from another person bathing (including washing, rinsing, drying)?: A Lot Help from another person to put on and taking off  regular upper body clothing?: A Little Help from another person to put on and taking off regular lower body clothing?: A Lot 6 Click Score: 16   End of Session Equipment Utilized During Treatment: Gait belt;Rolling walker Nurse Communication: Mobility status  Activity Tolerance: Patient limited by fatigue;Patient limited by pain Patient left: in chair;in bed;with bed alarm set  OT Visit Diagnosis: Unsteadiness on feet (R26.81);Muscle weakness (generalized) (M62.81);Pain;Other symptoms and signs involving cognitive function                Time: 5027-7412 OT Time Calculation (min): 41 min Charges:  OT General Charges $OT Visit: 1 Visit OT Evaluation $OT Eval Moderate Complexity: 1 Mod OT Treatments $Self Care/Home Management : 8-22 mins $Therapeutic Activity: 8-22 mins  Jefferey Pica, OTR/L Acute Rehabilitation Services Pager: [phone] Office: [phone]   Emmanual Gauthreaux C 04/07/2020, 9:55 AM

## 2020-04-07 NOTE — NC FL2 (Signed)
[place] MEDICAID FL2 LEVEL OF CARE SCREENING TOOL     IDENTIFICATION  Patient Name: [name] Birthdate: [birth year] Sex: female Admission Date (Current Location): 04/06/2020  [facility] and [place] Number:  Herbalist and Address:  [facility], [place] [address] [place], [place] [address]      Provider Number: [id number]  Attending Physician Name and Address:  Geradine Girt, DO  Relative Name and Phone Number:       Current Level of Care: Hospital Recommended Level of Care: [facility] Prior Approval Number:    Date Approved/Denied:   PASRR Number: [id number]  Discharge Plan: SNF    Current Diagnoses: Patient Active Problem List   Diagnosis Date Noted  . Orthostatic hypotension 04/07/2020  . Syncope and collapse 04/06/2020  . Fall at home, initial encounter 04/06/2020  . Acute right ankle pain 04/06/2020  . Acute lower UTI 10/07/2019  . Diarrhea 10/07/2019  . Acute cystitis without hematuria   . Muscle spasm of right lower extremity 08/05/2019  . Hyponatremia 08/05/2019  . Insomnia 08/05/2019  . Slow transit constipation 08/05/2019  . Atherosclerosis of abdominal aorta ([place]) 10/02/2015  . Obesity (BMI 30-39.9) 10/31/2014  . Chronic kidney disease (CKD), stage III (moderate) ([place]) 10/31/2014  . Ventral incisional hernia 10/31/2014  . Primary peritoneal carcinomatosis ([place]) 10/31/2014  . Incisional hernia, periumbilical, without obstruction or gangrene 11/03/2012  . Anemia associated with acute blood loss 04/03/2011  . Ovarian cancer ([place]) 01/25/2011  . HYPERLIPIDEMIA 04/23/2009  . Essential hypertension 04/23/2009  . GERD 04/23/2009  . Osteoarthritis 04/23/2009  . Disorder of bone and cartilage 04/23/2009    Orientation RESPIRATION BLADDER Height & Weight     Self,Situation  Normal Incontinent Weight: 172 lb 9.9 oz (78.3 kg) Height:  5\' 3"  (160 cm)  BEHAVIORAL SYMPTOMS/MOOD NEUROLOGICAL BOWEL  NUTRITION STATUS      Incontinent Diet (see DC summary)  AMBULATORY STATUS COMMUNICATION OF NEEDS Skin   Extensive Assist Verbally Normal                       Personal Care Assistance Level of Assistance  Bathing,Feeding,Dressing Bathing Assistance: Maximum assistance Feeding assistance: Limited assistance Dressing Assistance: Maximum assistance     Functional Limitations Info  Sight,Hearing,Speech Sight Info: Adequate Hearing Info: Adequate Speech Info: Adequate    SPECIAL CARE FACTORS FREQUENCY  PT (By licensed PT),OT (By licensed OT)     PT Frequency: 5x a week OT Frequency: 5x a week            Contractures Contractures Info: Not present    Additional Factors Info  Code Status,Allergies Code Status Info: Full Allergies Info: Morphine And Related   Penicillins   Nsaids   Shellfish Allergy           Current Medications (04/07/2020):  This is the current hospital active medication list Current Facility-Administered Medications  Medication Dose Route Frequency Provider Last Rate Last Admin  . acetaminophen (TYLENOL) tablet 1,000 mg  1,000 mg Oral TID Eulogio Bear U, DO   1,000 mg at 04/07/20 1049  . B-complex with vitamin C tablet 1 tablet  1 tablet Oral Daily Eulogio Bear U, DO   1 tablet at 04/07/20 1050  . calcium carbonate (TUMS - dosed in mg elemental calcium) chewable tablet 200 mg of elemental calcium  1 tablet Oral Daily PRN Karmen Bongo, MD      . cholecalciferol (VITAMIN D3) tablet 2,000 Units  2,000 Units Oral Daily Eulogio Bear U, DO   2,000 Units at 04/07/20 1051  . cycloSPORINE (RESTASIS) 0.05 % ophthalmic emulsion 1 drop  1 drop Both Eyes BID Karmen Bongo, MD   1 drop at 04/07/20 0825  . diclofenac Sodium (VOLTAREN) 1 % topical gel 2 g  2 g Topical QID Vann, Jessica U, DO      . docusate sodium (COLACE) capsule 100 mg  100 mg Oral BID Vann, Jessica U, DO   100 mg at 04/07/20 1153  . enoxaparin (LOVENOX) injection 30 mg  30 mg  Subcutaneous Q24H Karmen Bongo, MD   30 mg at 04/07/20 0827  . hydroxypropyl methylcellulose / hypromellose (ISOPTO TEARS / GONIOVISC) 2.5 % ophthalmic solution 1 drop  1 drop Both Eyes Daily PRN Karmen Bongo, MD      . lactated ringers infusion   Intravenous Continuous Karmen Bongo, MD 75 mL/hr at 04/07/20 0400 New Bag at 04/07/20 0400  . multivitamin with minerals tablet 1 tablet  1 tablet Oral Daily Eulogio Bear U, DO   1 tablet at 04/07/20 1051  . ondansetron (ZOFRAN) tablet 4 mg  4 mg Oral Q6H PRN Karmen Bongo, MD       Or  . ondansetron [facility]) injection 4 mg  4 mg Intravenous Q6H PRN Karmen Bongo, MD      . sodium chloride flush (NS) 0.9 % injection 3 mL  3 mL Intravenous Q12H Karmen Bongo, MD   3 mL at 04/06/20 2149     Discharge Medications: Please see discharge summary for a list of discharge medications.  Relevant Imaging Results:  Relevant Lab Results:   Additional Information SSN [id number]  Emeterio Reeve, [place]

## 2020-04-07 NOTE — TOC Initial Note (Signed)
Transition of Care [facility]) - Initial/Assessment Note    Patient Details  Name: [name] MRN: [id number] Date of Birth: [birth year]  [facility] CM/SW Contact:    Emeterio Reeve, [place] Phone Number: 04/07/2020, 11:55 AM  Clinical Narrative:                  CSW spoke to pts daughter, Almyra Free on the phone. Pt is only oriented x2. Almyra Free states pt lives at [place]. Pt was independent PTA, she did use a walker for mobility. Pt was able to complete ADL's on her own.   CSW reviewed pt/ot reccs for SNF. Almyra Free states her [facility]. Pt is covid vaccinated.   Expected Discharge Plan: Skilled Nursing Facility Barriers to Discharge: Continued Medical Work up   Patient Goals and CMS Choice Patient states their goals for this hospitalization and ongoing recovery are:: [facility] CMS Medicare.gov Compare Post Acute Care list provided to:: Patient Choice offered to / list presented to : Adult Children  Expected Discharge Plan and Services Expected Discharge Plan: [place] arrangements for the past 2 months: [place]                                      Prior Living Arrangements/Services Living arrangements for the past 2 months: [place] Lives with:: Facility Resident Patient language and need for interpreter reviewed:: Yes Do you feel safe going back to the place where you live?: Yes      Need for Family Participation in Patient Care: Yes (Comment) Care giver support system in place?: Yes (comment) Current home services: DME Criminal Activity/Legal Involvement Pertinent to Current Situation/Hospitalization: No - Comment as needed  Activities of Daily Living Home Assistive Devices/Equipment: None ADL Screening (condition at time of admission) Patient's cognitive ability adequate to safely complete daily activities?: Yes Is the patient deaf  or have difficulty hearing?: Yes Does the patient have difficulty seeing, even when wearing glasses/contacts?: No Does the patient have difficulty concentrating, remembering, or making decisions?: Yes Patient able to express need for assistance with ADLs?: Yes Does the patient have difficulty dressing or bathing?: Yes Independently performs ADLs?: Yes (appropriate for developmental age) Does the patient have difficulty walking or climbing stairs?: Yes Weakness of Legs: Both Weakness of Arms/Hands: Both  Permission Sought/Granted Permission sought to share information with : Family Chief Financial Officer Permission granted to share information with : Yes, Verbal Permission Granted     Permission granted to share info w AGENCY: SNF  Permission granted to share info w Relationship: Daughter     Emotional Assessment Appearance:: Appears stated age Attitude/Demeanor/Rapport: Unable to Assess Affect (typically observed): Unable to Assess Orientation: : Oriented to Self,Oriented to Place Alcohol / Substance Use: Not Applicable Psych Involvement: No (comment)  Admission diagnosis:  Syncope and collapse [R55] Fall [W19.XXXA] Facial laceration, initial encounter [S01.81XA] Orthostatic hypotension [I95.1] Patient Active Problem List   Diagnosis Date Noted  . Orthostatic hypotension 04/07/2020  . Syncope and collapse 04/06/2020  . Fall at home, initial encounter 04/06/2020  . Acute right ankle pain 04/06/2020  . Acute lower UTI 10/07/2019  . Diarrhea 10/07/2019  . Acute cystitis without hematuria   . Muscle spasm of right lower extremity 08/05/2019  . Hyponatremia 08/05/2019  . Insomnia 08/05/2019  .  Slow transit constipation 08/05/2019  . Atherosclerosis of abdominal aorta ([place]) 10/02/2015  . Obesity (BMI 30-39.9) 10/31/2014  . Chronic kidney disease (CKD), stage III (moderate) ([place]) 10/31/2014  . Ventral incisional hernia 10/31/2014  . Primary peritoneal  carcinomatosis ([place]) 10/31/2014  . Incisional hernia, periumbilical, without obstruction or gangrene 11/03/2012  . Anemia associated with acute blood loss 04/03/2011  . Ovarian cancer ([place]) 01/25/2011  . HYPERLIPIDEMIA 04/23/2009  . Essential hypertension 04/23/2009  . GERD 04/23/2009  . Osteoarthritis 04/23/2009  . Disorder of bone and cartilage 04/23/2009   PCP:  Hulan Fess, MD Pharmacy:   [id number] [place], [place] [address] Phone: [phone] Fax: [phone]     Social Determinants of Health (SDOH) Interventions    Readmission Risk Interventions No flowsheet data found.  Emeterio Reeve, Latanya Presser, [place] Social Worker [phone]

## 2020-04-08 ENCOUNTER — Inpatient Hospital Stay (HOSPITAL_COMMUNITY): Payer: Medicare Other

## 2020-04-08 DIAGNOSIS — R55 Syncope and collapse: Secondary | ICD-10-CM | POA: Diagnosis not present

## 2020-04-08 LAB — BASIC METABOLIC PANEL
Anion gap: 10 (ref 5–15)
BUN: 12 mg/dL (ref 8–23)
CO2: 22 mmol/L (ref 22–32)
Calcium: 8.6 mg/dL — ABNORMAL LOW (ref 8.9–10.3)
Chloride: 97 mmol/L — ABNORMAL LOW (ref 98–111)
Creatinine, Ser: 0.87 mg/dL (ref 0.44–1.00)
GFR, Estimated: >60 mL/min (ref >60)
Glucose, Bld: 94 mg/dL (ref 70–99)
Potassium: 4.1 mmol/L (ref 3.5–5.1)
Sodium: 129 mmol/L — ABNORMAL LOW (ref 135–145)

## 2020-04-08 LAB — CORTISOL: Cortisol, Plasma: 9.8 ug/dL

## 2020-04-08 LAB — MAGNESIUM: Magnesium: 1.3 mg/dL — ABNORMAL LOW (ref 1.7–2.4)

## 2020-04-08 MED ORDER — MAGNESIUM SULFATE 2 GM/50ML IV SOLN
2.0000 g | Freq: Once | INTRAVENOUS | Status: AC
  Administered 2020-04-08: 2 g via INTRAVENOUS
  Filled 2020-04-08: qty 50

## 2020-04-08 MED ORDER — MELATONIN 3 MG PO TABS
3.0000 mg | ORAL_TABLET | Freq: Every day | ORAL | Status: DC
  Administered 2020-04-08 – 2020-04-10 (×3): 3 mg via ORAL
  Filled 2020-04-08 (×3): qty 1

## 2020-04-08 MED ORDER — LISINOPRIL 20 MG PO TABS
20.0000 mg | ORAL_TABLET | Freq: Every day | ORAL | Status: DC
  Administered 2020-04-08 – 2020-04-09 (×2): 20 mg via ORAL
  Filled 2020-04-08 (×4): qty 1
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
## 2020-04-08 NOTE — Progress Notes (Signed)
Progress Note    [name]  JME:[id number] DOB: 1930-01-24  DOA: 04/06/2020 PCP: Hulan Fess, MD    Brief Narrative:     Medical records reviewed and are as summarized below:  [name] is an 87 y.o. female with medical history significant of prosthetic joint infection; remote ovarian CA; HTN; and HLD presenting with syncope.  The patient reports that she was feeling well yesterday but took her evening BP medication later than usual (3 -> 6pm) and took her usual Ambien.  She thinks she fell asleep in her recliner.  She got up to have a BM about 1245 and collapsed to the floor while on the commode.  She does not think she lost consciousness, but she struck her face and nose.  She was brought to the ER and felt somewhat dizzy when she need to go to the restroom; when she got out of bed she was increasingly dizzy and again became presyncopal. She did injure her R ankle with falling.   She was seen in the ER on 1/21 with diarrhea and presyncope.  It was a similar episode to now.  She was seen by her PCP on 2/21 and there was discussion about maybe needing to change her BP medications.  Assessment/Plan:   Principal Problem:   Syncope and collapse Active Problems:   Essential hypertension   Chronic kidney disease (CKD), stage III (moderate) (HCC)   Fall at home, initial encounter   Acute right ankle pain   Orthostatic hypotension   Syncope -Most likely associated with orthostasis and/or micturitional syncope -Ambien may also be a contributing factor -Patients at highest risk from syncope include those with serious comorbidities; age >19; exertional > supine syncope; palpitations; abnormal EKG; abnormal vital signs; or abnormal exam.  Her age is her only such RF. -orthos daily-- none done on 2/27 yet -Negative troponins x2  -echo similar to prior -TSH ok, cortisol normal  Fall -Due to syncope -Has facial injuries that appear to be superficial/hemostatic at this  time  R ankle/foot pain -Injured in fall -No fracture on xray of ankle -x ray of foot: Subtle transverse lucencies at the base of the third and fourth metatarsals on the oblique view likely within normal, although could represent subtle nondisplaced fractures. Consider follow-up radiograph 7-10 days. -recent sprain in Jan -- seen by podiatry -daughter to bring in brace -PT/OT consults -will need SNF  HTN -Hold BP medications for now due to concern for orthostasis as the cause for her syncope -She usually takes Norvasc, lisinopril, and nadolol -Her BP goal should likely be <160 based on age -orthos +-- continue to follow   Stage 3a CKD -Appears to be stable at this time  Low magnesium -replete IV  Family Communication/Anticipated D/C date and plan/Code Status   DVT prophylaxis: Lovenox ordered. Code Status: Full Code.  Family Communication: called daughter 2/26 Disposition Plan: Status is: Inpatient  Remains inpatient appropriate because:Unsafe d/c plan and Inpatient level of care appropriate due to severity of illness   Dispo: The patient is from: Home              Anticipated d/c is to: SNF              Patient currently is not medically stable to d/c.   Difficult to place patient No         Medical Consultants:    None.     Subjective:   Pain on lateral part of foot,  slept well last night  Objective:    Vitals:   04/07/20 2334 04/08/20 0343 04/08/20 0900 04/08/20 1002  BP: 131/65 (!) 155/87 (!) 143/95 (!) 174/76  Pulse: 87 85 91 91  Resp: 17 19  18   Temp: 99.2 F (37.3 C) 98.7 F (37.1 C)  98.3 F (36.8 C)  TempSrc: Oral Oral  Oral  SpO2: 97% 96%  97%  Weight:  76.1 kg    Height:        Intake/Output Summary (Last 24 hours) at 04/08/2020 1132 Last data filed at 04/08/2020 6834 Gross per 24 hour  Intake 240 ml  Output 2075 ml  Net -1835 ml   Filed Weights   04/06/20 1629 04/07/20 0505 04/08/20 0343  Weight: 76.8 kg 78.3 kg 76.1  kg    Exam:  General: Appearance:     Overweight female in no acute distress     Lungs:     respirations unlabored  Heart:    Normal heart rate. Normal rhythm. No murmurs, rubs, or gallops.   MS:   All extremities are intact.  Bruising over lateral part of right foot  Neurologic:   Awake, alert, Jennifer and cooperative     Data Reviewed:   I have personally reviewed following labs and imaging studies:  Labs: Labs show the following:   Basic Metabolic Panel: Recent Labs  Lab 04/06/20 0207 04/07/20 0220 04/08/20 0322  NA 131* 130* 129*  K 4.6 4.0 4.1  CL 100 102 97*  CO2 20* 20* 22  GLUCOSE 136* 118* 94  BUN 23 12 12   CREATININE 1.12* 0.75 0.87  CALCIUM 9.4 8.5* 8.6*  MG  --   --  1.3*   GFR Estimated Creatinine Clearance: 43.7 mL/min (by C-G formula based on SCr of 0.87 mg/dL). Liver Function Tests: No results for input(s): AST, ALT, ALKPHOS, BILITOT, PROT, ALBUMIN in the last 168 hours. No results for input(s): LIPASE, AMYLASE in the last 168 hours. No results for input(s): AMMONIA in the last 168 hours. Coagulation profile No results for input(s): INR, PROTIME in the last 168 hours.  CBC: Recent Labs  Lab 04/06/20 0207 04/07/20 0220  WBC 11.7* 8.8  NEUTROABS 9.2*  --   HGB 14.1 10.9*  HCT 42.6 30.9*  MCV 86.6 82.4  PLT 281 225   Cardiac Enzymes: No results for input(s): CKTOTAL, CKMB, CKMBINDEX, TROPONINI in the last 168 hours. BNP (last 3 results) No results for input(s): PROBNP in the last 8760 hours. CBG: No results for input(s): GLUCAP in the last 168 hours. D-Dimer: No results for input(s): DDIMER in the last 72 hours. Hgb A1c: No results for input(s): HGBA1C in the last 72 hours. Lipid Profile: No results for input(s): CHOL, HDL, LDLCALC, TRIG, CHOLHDL, LDLDIRECT in the last 72 hours. Thyroid function studies: Recent Labs    04/06/20 1234  TSH 2.357   Anemia work up: No results for input(s): VITAMINB12, FOLATE, FERRITIN, TIBC,  IRON, RETICCTPCT in the last 72 hours. Sepsis Labs: Recent Labs  Lab 04/06/20 0207 04/07/20 0220  WBC 11.7* 8.8    Microbiology Recent Results (from the past 240 hour(s))  SARS CORONAVIRUS 2 (TAT 6-24 HRS) Nasopharyngeal Nasopharyngeal Swab     Status: None   Collection Time: 04/06/20 10:34 AM   Specimen: Nasopharyngeal Swab  Result Value Ref Range Status   SARS Coronavirus 2 NEGATIVE NEGATIVE Final    Comment: (NOTE) SARS-CoV-2 target nucleic acids are NOT DETECTED.  The SARS-CoV-2 RNA is generally detectable in upper  and lower respiratory specimens during the acute phase of infection. Negative results do not preclude SARS-CoV-2 infection, do not rule out co-infections with other pathogens, and should not be used as the sole basis for treatment or other patient management decisions. Negative results must be combined with clinical observations, patient history, and epidemiological information. The expected result is Negative.  Fact Sheet for Patients: SugarRoll.be  Fact Sheet for Healthcare Providers: https://www.woods-mathews.com/  This test is not yet approved or cleared by the [place] FDA and  has been authorized for detection and/or diagnosis of SARS-CoV-2 by FDA under an Emergency Use Authorization (EUA). This EUA will remain  in effect (meaning this test can be used) for the duration of the COVID-19 declaration under Se ction 564(b)(1) of the Act, 21 U.S.C. section 360bbb-3(b)(1), unless the authorization is terminated or revoked sooner.  Performed at [facility], [place] [address] [place], [place] [address]     Procedures and diagnostic studies:  DG Foot Complete Right  Result Date: 04/08/2020 CLINICAL DATA:  Lateral foot pain and swelling. EXAM: RIGHT FOOT COMPLETE - 3+ VIEW COMPARISON:  None. FINDINGS: Old fourth metatarsal fracture. Mild diffuse decreased bone mineralization. Mild degenerate changes over  the midfoot and hindfoot. Small inferior calcaneal spur. Subtle transverse lucencies at the base of the third and fourth metatarsals on the oblique view likely within normal although could not exclude subtle nondisplaced fractures. IMPRESSION: Subtle transverse lucencies at the base of the third and fourth metatarsals on the oblique view likely within normal, although could represent subtle nondisplaced fractures. Consider follow-up radiograph 7-10 days. Electronically Signed   By: Marin Olp M.D.   On: 04/08/2020 11:12   ECHOCARDIOGRAM COMPLETE  Result Date: 04/06/2020    ECHOCARDIOGRAM REPORT   Patient Name:   [name] [facility] Date of Exam: 04/06/2020 Medical Rec #:  [id number]        Height:       63.0 in Accession #:    [id number]       Weight:       155.0 lb Date of Birth:  18-Nov-1929        BSA:          1.735 m Patient Age:    89 years         BP:           140/64 mmHg Patient Gender: F                HR:           84 bpm. Exam Location:  Inpatient Procedure: 2D Echo, Cardiac Doppler and Color Doppler Indications:    R55 Syncope  History:        Patient has prior history of Echocardiogram examinations, most                 recent 01/04/2019. Signs/Symptoms:Murmur, Shortness of Breath                 and Dyspnea; Risk Factors:Hypertension, Dyslipidemia and Former                 Smoker.  Sonographer:    Roseanna Rainbow RDCS Referring Phys: [id number] JENNIFER YATES  Sonographer Comments: Technically difficult study due to poor echo windows. Patient had recent fall, could not turn. Patient sensitive to pressure from probe in apical region. IMPRESSIONS  1. Left ventricular ejection fraction, by estimation, is 70 to 75%. The left ventricle has hyperdynamic function. The left ventricle has no regional  wall motion abnormalities. There is mild left ventricular hypertrophy. Left ventricular diastolic parameters are consistent with Grade I diastolic dysfunction (impaired relaxation).  2. Right ventricular systolic function  is normal. The right ventricular size is normal.  3. The mitral valve is normal in structure. No evidence of mitral valve regurgitation. No evidence of mitral stenosis.  4. The aortic valve is tricuspid. Aortic valve regurgitation is mild. Mild aortic valve stenosis. Aortic valve area, by VTI measures 1.91 cm. Aortic valve mean gradient measures 10.0 mmHg. Aortic valve Vmax measures 2.12 m/s.  5. The inferior vena cava is normal in size with greater than 50% respiratory variability, suggesting right atrial pressure of 3 mmHg. FINDINGS  Left Ventricle: Left ventricular ejection fraction, by estimation, is 70 to 75%. The left ventricle has hyperdynamic function. The left ventricle has no regional wall motion abnormalities. The left ventricular internal cavity size was normal in size. There is mild left ventricular hypertrophy. Left ventricular diastolic parameters are consistent with Grade I diastolic dysfunction (impaired relaxation). Right Ventricle: The right ventricular size is normal. No increase in right ventricular wall thickness. Right ventricular systolic function is normal. Left Atrium: Left atrial size was normal in size. Right Atrium: Right atrial size was normal in size. Pericardium: There is no evidence of pericardial effusion. Mitral Valve: The mitral valve is normal in structure. Mild mitral annular calcification. No evidence of mitral valve regurgitation. No evidence of mitral valve stenosis. Tricuspid Valve: The tricuspid valve is normal in structure. Tricuspid valve regurgitation is not demonstrated. No evidence of tricuspid stenosis. Aortic Valve: The aortic valve is tricuspid. Aortic valve regurgitation is mild. Aortic regurgitation PHT measures 393 msec. Mild aortic stenosis is present. Aortic valve mean gradient measures 10.0 mmHg. Aortic valve peak gradient measures 18.0 mmHg. Aortic valve area, by VTI measures 1.91 cm. Pulmonic Valve: The pulmonic valve was normal in structure. Pulmonic  valve regurgitation is not visualized. No evidence of pulmonic stenosis. Aorta: The aortic root is normal in size and structure. Venous: The inferior vena cava is normal in size with greater than 50% respiratory variability, suggesting right atrial pressure of 3 mmHg. IAS/Shunts: No atrial level shunt detected by color flow Doppler.  LEFT VENTRICLE PLAX 2D LVIDd:         3.40 cm     Diastology LVIDs:         2.00 cm     LV e' medial:    6.96 cm/s LV PW:         1.10 cm     LV E/e' medial:  14.2 LV IVS:        1.30 cm     LV e' lateral:   4.90 cm/s LVOT diam:     1.90 cm     LV E/e' lateral: 20.1 LV SV:         93 LV SV Index:   54 LVOT Area:     2.84 cm  LV Volumes (MOD) LV vol d, MOD A2C: 44.9 ml LV vol d, MOD A4C: 56.2 ml LV vol s, MOD A2C: 9.0 ml LV vol s, MOD A4C: 13.8 ml LV SV MOD A2C:     35.9 ml LV SV MOD A4C:     56.2 ml LV SV MOD BP:      39.8 ml RIGHT VENTRICLE             IVC RV S prime:     20.00 cm/s  IVC diam: 1.90 cm TAPSE (M-mode): 3.2 cm LEFT  ATRIUM             Index LA diam:        4.80 cm 2.77 cm/m LA Vol (A2C):   42.8 ml 24.67 ml/m LA Vol (A4C):   36.8 ml 21.21 ml/m LA Biplane Vol: 41.4 ml 23.86 ml/m  AORTIC VALVE AV Area (Vmax):    2.55 cm AV Area (Vmean):   2.25 cm AV Area (VTI):     1.91 cm AV Vmax:           212.00 cm/s AV Vmean:          150.000 cm/s AV VTI:            0.486 m AV Peak Grad:      18.0 mmHg AV Mean Grad:      10.0 mmHg LVOT Vmax:         191.00 cm/s LVOT Vmean:        119.000 cm/s LVOT VTI:          0.328 m LVOT/AV VTI ratio: 0.67 AI PHT:            393 msec  AORTA Ao Root diam: 3.10 cm Ao Asc diam:  3.60 cm MITRAL VALVE MV Area (PHT): 3.72 cm     SHUNTS MV Decel Time: 204 msec     Systemic VTI:  0.33 m MV E velocity: 98.50 cm/s   Systemic Diam: 1.90 cm MV A velocity: 104.00 cm/s MV E/A ratio:  0.95 Candee Furbish MD Electronically signed by Candee Furbish MD Signature Date/Time: 04/06/2020/3:25:34 PM    Final     Medications:   . acetaminophen  1,000 mg Oral TID  .  B-complex with vitamin C  1 tablet Oral Daily  . cholecalciferol  2,000 Units Oral Daily  . cycloSPORINE  1 drop Both Eyes BID  . diclofenac Sodium  2 g Topical QID  . docusate sodium  100 mg Oral BID  . enoxaparin (LOVENOX) injection  30 mg Subcutaneous Q24H  . lisinopril  20 mg Oral Daily  . multivitamin with minerals  1 tablet Oral Daily  . sodium chloride flush  3 mL Intravenous Q12H   Continuous Infusions:    LOS: 1 day   [facility]   How to contact the [facility] Attending or Consulting provider [place] or covering provider during after hours [place], for this patient?  1. Check the care team in [facility] and look for a) attending/consulting [facility] provider listed and b) the [facility] team listed 2. Log into [email or web address] and use [place]'s universal password to access. If you do not have the password, please contact the hospital operator. 3. Locate the [facility] provider you are looking for under [facility] and page to a number that you can be directly reached. 4. If you still have difficulty reaching the provider, please page the [facility] (Director on Call) for the Hospitalists listed on amion for assistance.  04/08/2020, 11:32 AM

## 2020-04-09 DIAGNOSIS — R55 Syncope and collapse: Secondary | ICD-10-CM | POA: Diagnosis not present

## 2020-04-09 LAB — BASIC METABOLIC PANEL
Anion gap: 8 (ref 5–15)
BUN: 18 mg/dL (ref 8–23)
CO2: 23 mmol/L (ref 22–32)
Calcium: 8.6 mg/dL — ABNORMAL LOW (ref 8.9–10.3)
Chloride: 99 mmol/L (ref 98–111)
Creatinine, Ser: 0.85 mg/dL (ref 0.44–1.00)
GFR, Estimated: >60 mL/min (ref >60)
Glucose, Bld: 105 mg/dL — ABNORMAL HIGH (ref 70–99)
Potassium: 4.3 mmol/L (ref 3.5–5.1)
Sodium: 130 mmol/L — ABNORMAL LOW (ref 135–145)

## 2020-04-09 LAB — CBC
HCT: 33.1 % — ABNORMAL LOW (ref 36.0–46.0)
Hemoglobin: 10.8 g/dL — ABNORMAL LOW (ref 12.0–15.0)
MCH: 27.8 pg (ref 26.0–34.0)
MCHC: 32.6 g/dL (ref 30.0–36.0)
MCV: 85.3 fL (ref 80.0–100.0)
Platelets: 257 10*3/uL (ref 150–400)
RBC: 3.88 MIL/uL (ref 3.87–5.11)
RDW: 15.1 % (ref 11.5–15.5)
WBC: 7.3 10*3/uL (ref 4.0–10.5)
nRBC: 0 % (ref 0.0–0.2)

## 2020-04-09 MED ORDER — LACTATED RINGERS IV BOLUS
1000.0000 mL | Freq: Once | INTRAVENOUS | Status: AC
  Administered 2020-04-09: 1000 mL via INTRAVENOUS

## 2020-04-09 MED ORDER — ENOXAPARIN SODIUM 40 MG/0.4ML ~~LOC~~ SOLN: 40.0000 mg | SUBCUTANEOUS | Status: DC

## 2020-04-09 NOTE — Progress Notes (Signed)
Orthopedic Tech Progress Note Patient Details:  [name] May 15, 1929 [id number] Spoke with MD about patient needing a certain brace/brand.. so I left the ASO at bedside talked to RN as well. Seeing if therapy could work with patient and ANKLE BRACE Ortho Devices Type of Ortho Device: ASO Ortho Device/Splint Location: RLE Ortho Device/Splint Interventions: Ordered,Application,Other (comment)   Post Interventions Patient Tolerated: Well Instructions Provided: Care of device   Janit Pagan 04/09/2020, 8:01 AM

## 2020-04-09 NOTE — Progress Notes (Signed)
[facility] informed patient feeling light headed vs 98.0 108/50 HR 106 98% RA. Placed cool wash cloth on patient face. Will continue to monitor. Arthor Captain LPN

## 2020-04-09 NOTE — Progress Notes (Signed)
Progress Note    [name]  JXB:[id number] DOB: 1929/10/15  DOA: 04/06/2020 PCP: Hulan Fess, MD    Brief Narrative:     Medical records reviewed and are as summarized below:  [name] is an 87 y.o. female with medical history significant of prosthetic joint infection; remote ovarian CA; HTN; and HLD presenting with syncope.  The patient reports that she was feeling well yesterday but took her evening BP medication later than usual (3 -> 6pm) and took her usual Ambien.  She thinks she fell asleep in her recliner.  She got up to have a BM about 1245 and collapsed to the floor while on the commode.  She does not think she lost consciousness, but she struck her face and nose.  She was brought to the ER and felt somewhat dizzy when she need to go to the restroom; when she got out of bed she was increasingly dizzy and again became presyncopal. She did injure her R ankle with falling.   She was seen in the ER on 1/21 with diarrhea and presyncope.  It was a similar episode to now.  She was seen by her PCP on 2/21 and there was discussion about maybe needing to change her BP medications.   Assessment/Plan:   Principal Problem:   Syncope and collapse Active Problems:   Essential hypertension   Chronic kidney disease (CKD), stage III (moderate) (HCC)   Fall at home, initial encounter   Acute right ankle pain   Orthostatic hypotension   Syncope -Most likely associated with orthostasis and/or micturitional syncope -Ambien may also be a contributing factor -Patients at highest risk from syncope include those with serious comorbidities; age >52; exertional > supine syncope; palpitations; abnormal EKG; abnormal vital signs; or abnormal exam.  Her age is her only such RF. -orthos daily-- negative on 2/27-- repeating 2/28 -Negative troponins x2  -echo similar to prior -TSH ok, cortisol normal  Fall -Due to syncope -Has facial injuries that appear to be  superficial/hemostatic at this time  R ankle/foot pain -Injured in fall -No fracture on xray of ankle -x ray of foot: Subtle transverse lucencies at the base of the third and fourth metatarsals on the oblique view likely within normal, although could represent subtle nondisplaced fractures. Consider follow-up radiograph 7-10 days. -recent sprain in Jan -- seen by podiatry -ordered ankle brace -PT/OT consults -will need SNF  HTN -Hold BP medications for now due to concern for orthostasis as the cause for her syncope -She usually takes Norvasc, lisinopril, and nadolol -Her BP goal should likely be <160 based on age  Stage 3a CKD -Appears to be stable at this time  hypomagnesemia -replete IV  Family Communication/Anticipated D/C date and plan/Code Status   DVT prophylaxis: Lovenox ordered. Code Status: Full Code.  Family Communication: called daughter 2/27 Disposition Plan: Status is: Inpatient  Remains inpatient appropriate because:Unsafe d/c plan and Inpatient level of care appropriate due to severity of illness   Dispo: The patient is from: Home              Anticipated d/c is to: SNF              Patient currently is medically stable to d/c.   Difficult to place patient No         Medical Consultants:    None.     Subjective:   Scared to get up--- long discussion about need to work with PT  Objective:  Vitals:   04/08/20 2000 04/09/20 0010 04/09/20 0422 04/09/20 1212  BP: 133/63 131/69 120/66 136/67  Pulse: 95 97 90 93  Resp: 18 19 17 18   Temp: 99.4 F (37.4 C) 98.8 F (37.1 C) 98.5 F (36.9 C) 98.9 F (37.2 C)  TempSrc: Oral Oral Oral Oral  SpO2: 96% 96% 96% 98%  Weight:   75.5 kg   Height:        Intake/Output Summary (Last 24 hours) at 04/09/2020 1434 Last data filed at 04/09/2020 1200 Gross per 24 hour  Intake 51.86 ml  Output 701 ml  Net -649.14 ml   Filed Weights   04/07/20 0505 04/08/20 0343 04/09/20 0422  Weight: 78.3  kg 76.1 kg 75.5 kg    Exam:  General: Appearance:     Overweight female in no acute distress     Lungs:      respirations unlabored  Heart:    Normal heart rate. Normal rhythm. No murmurs, rubs, or gallops.   MS:   All extremities are intact.   Neurologic:   Awake, alert      Data Reviewed:   I have personally reviewed following labs and imaging studies:  Labs: Labs show the following:   Basic Metabolic Panel: Recent Labs  Lab 04/06/20 0207 04/07/20 0220 04/08/20 0322 04/09/20 0159  NA 131* 130* 129* 130*  K 4.6 4.0 4.1 4.3  CL 100 102 97* 99  CO2 20* 20* 22 23  GLUCOSE 136* 118* 94 105*  BUN 23 12 12 18   CREATININE 1.12* 0.75 0.87 0.85  CALCIUM 9.4 8.5* 8.6* 8.6*  MG  --   --  1.3*  --    GFR Estimated Creatinine Clearance: 44.5 mL/min (by C-G formula based on SCr of 0.85 mg/dL). Liver Function Tests: No results for input(s): AST, ALT, ALKPHOS, BILITOT, PROT, ALBUMIN in the last 168 hours. No results for input(s): LIPASE, AMYLASE in the last 168 hours. No results for input(s): AMMONIA in the last 168 hours. Coagulation profile No results for input(s): INR, PROTIME in the last 168 hours.  CBC: Recent Labs  Lab 04/06/20 0207 04/07/20 0220 04/09/20 0159  WBC 11.7* 8.8 7.3  NEUTROABS 9.2*  --   --   HGB 14.1 10.9* 10.8*  HCT 42.6 30.9* 33.1*  MCV 86.6 82.4 85.3  PLT 281 225 257   Cardiac Enzymes: No results for input(s): CKTOTAL, CKMB, CKMBINDEX, TROPONINI in the last 168 hours. BNP (last 3 results) No results for input(s): PROBNP in the last 8760 hours. CBG: No results for input(s): GLUCAP in the last 168 hours. D-Dimer: No results for input(s): DDIMER in the last 72 hours. Hgb A1c: No results for input(s): HGBA1C in the last 72 hours. Lipid Profile: No results for input(s): CHOL, HDL, LDLCALC, TRIG, CHOLHDL, LDLDIRECT in the last 72 hours. Thyroid function studies: No results for input(s): TSH, T4TOTAL, T3FREE, THYROIDAB in the last 72  hours.  Invalid input(s): FREET3 Anemia work up: No results for input(s): VITAMINB12, FOLATE, FERRITIN, TIBC, IRON, RETICCTPCT in the last 72 hours. Sepsis Labs: Recent Labs  Lab 04/06/20 0207 04/07/20 0220 04/09/20 0159  WBC 11.7* 8.8 7.3    Microbiology Recent Results (from the past 240 hour(s))  SARS CORONAVIRUS 2 (TAT 6-24 HRS) Nasopharyngeal Nasopharyngeal Swab     Status: None   Collection Time: 04/06/20 10:34 AM   Specimen: Nasopharyngeal Swab  Result Value Ref Range Status   SARS Coronavirus 2 NEGATIVE NEGATIVE Final    Comment: (NOTE) SARS-CoV-2 target  nucleic acids are NOT DETECTED.  The SARS-CoV-2 RNA is generally detectable in upper and lower respiratory specimens during the acute phase of infection. Negative results do not preclude SARS-CoV-2 infection, do not rule out co-infections with other pathogens, and should not be used as the sole basis for treatment or other patient management decisions. Negative results must be combined with clinical observations, patient history, and epidemiological information. The expected result is Negative.  Fact Sheet for Patients: SugarRoll.be  Fact Sheet for Healthcare Providers: https://www.woods-mathews.com/  This test is not yet approved or cleared by the [place] FDA and  has been authorized for detection and/or diagnosis of SARS-CoV-2 by FDA under an Emergency Use Authorization (EUA). This EUA will remain  in effect (meaning this test can be used) for the duration of the COVID-19 declaration under Se ction 564(b)(1) of the Act, 21 U.S.C. section 360bbb-3(b)(1), unless the authorization is terminated or revoked sooner.  Performed at [facility], [place] [address] [place], [place] [address]     Procedures and diagnostic studies:  DG Foot Complete Right  Result Date: 04/08/2020 CLINICAL DATA:  Lateral foot pain and swelling. EXAM: RIGHT FOOT COMPLETE - 3+ VIEW  COMPARISON:  None. FINDINGS: Old fourth metatarsal fracture. Mild diffuse decreased bone mineralization. Mild degenerate changes over the midfoot and hindfoot. Small inferior calcaneal spur. Subtle transverse lucencies at the base of the third and fourth metatarsals on the oblique view likely within normal although could not exclude subtle nondisplaced fractures. IMPRESSION: Subtle transverse lucencies at the base of the third and fourth metatarsals on the oblique view likely within normal, although could represent subtle nondisplaced fractures. Consider follow-up radiograph 7-10 days. Electronically Signed   By: Marin Olp M.D.   On: 04/08/2020 11:12    Medications:    acetaminophen  1,000 mg Oral TID   B-complex with vitamin C  1 tablet Oral Daily   cholecalciferol  2,000 Units Oral Daily   cycloSPORINE  1 drop Both Eyes BID   diclofenac Sodium  2 g Topical QID   docusate sodium  100 mg Oral BID   [START ON 04/10/2020] enoxaparin (LOVENOX) injection  40 mg Subcutaneous Q24H   lisinopril  20 mg Oral Daily   melatonin  3 mg Oral QHS   multivitamin with minerals  1 tablet Oral Daily   sodium chloride flush  3 mL Intravenous Q12H   Continuous Infusions:    LOS: 2 days   Geradine Girt  [facility]   How to contact the [facility] Attending or Consulting provider [place] or covering provider during after hours [place], for this patient?  1. Check the care team in [facility] and look for a) attending/consulting TRH provider listed and b) the [facility] team listed 2. Log into [email or web address] and use 's universal password to access. If you do not have the password, please contact the hospital operator. 3. Locate the [facility] provider you are looking for under [facility] and page to a number that you can be directly reached. 4. If you still have difficulty reaching the provider, please page the [facility] (Director on Call) for the Hospitalists listed on amion for assistance.  04/09/2020,  2:34 PM

## 2020-04-10 DIAGNOSIS — R55 Syncope and collapse: Secondary | ICD-10-CM | POA: Diagnosis not present

## 2020-04-10 LAB — SARS CORONAVIRUS 2 (TAT 6-24 HRS): SARS Coronavirus 2: NEGATIVE

## 2020-04-10 MED ORDER — MECLIZINE HCL 12.5 MG PO TABS
12.5000 mg | ORAL_TABLET | Freq: Two times a day (BID) | ORAL | Status: DC | PRN
  Administered 2020-04-10: 12.5 mg via ORAL
  Filled 2020-04-10 (×2): qty 1

## 2020-04-10 NOTE — Progress Notes (Signed)
Patient became very emotional she stated that she didn't any blood thinners she asked if I had given her a shot in her abdomen I told her that I did not have that on my list to give she told me that her husband died after taking Plavix and that she again states she did not want a blood thinner I told her that it was it was within her right to refuse any medication and that I would inform Md of her wishes. Arthor Captain LPN

## 2020-04-10 NOTE — Progress Notes (Signed)
Physical Therapy Treatment Patient Details Name: [name] MRN: [id number] DOB: 1930-01-28 Today's Date: 04/10/2020    History of Present Illness Pt is an 87 y/o female admitted following syncopal episode at [facility]. Pt also had another episode while in ED after using bathroom. Pt complaining of R ankle pain, however, imaging negative. Per notes, pt with possible concussion. PMH includes ovarian CA; HTN; and HLD.    PT Comments    Patient continues to be limited by orthostasis. MD made aware. Educated pt on importance of sitting upright (asking to return to bed prior to PT leaving) and educated on trying to use BSC instead of purewick. Discussed with NT and RN.     Follow Up Recommendations  SNF     Equipment Recommendations  None recommended by PT    Recommendations for Other Services       Precautions / Restrictions Precautions Precautions: Fall Precaution Comments: watch BP Required Braces or Orthoses: Other Brace Other Brace: rt ASO Restrictions Weight Bearing Restrictions: No    Mobility  Bed Mobility Overal bed mobility: Needs Assistance Bed Mobility: Rolling;Sidelying to Sit Rolling: Supervision Sidelying to sit: Min guard       General bed mobility comments: MinguardA with HOB elevated and rail (for safety due to orthostasis    Transfers Overall transfer level: Needs assistance Equipment used: 4-wheeled walker Transfers: Sit to/from [facility] Sit to Stand: Min guard Stand pivot transfers: Min assist       General transfer comment: from EOB twice with minguard assist for safety; pivot to recliner with min assist to maneuver rollator  Ambulation/Gait             General Gait Details: unable due to orthostasis   Stairs             Wheelchair Mobility    Modified Rankin (Stroke Patients Only)       Balance Overall balance assessment: Needs assistance Sitting-balance support: No upper extremity supported Sitting  balance-Leahy Scale: Fair     Standing balance support: Bilateral upper extremity supported Standing balance-Leahy Scale: Poor Standing balance comment: reliant on RW,                            Cognition Arousal/Alertness: Awake/alert Behavior During Therapy: WFL for tasks assessed/performed Overall Cognitive Status: No family/caregiver present to determine baseline cognitive functioning                                 General Comments: oriented to place and situation; time NT; recalls both falls now      Exercises Other Exercises Other Exercises: seated EOB exercises to attempt to raise BP (unsuccessful) LAQ, hand pumps, elbow curls with shoulder flexion, isometric adduction LEs x 10 reps each    General Comments General comments (skin integrity, edema, etc.): MD notified re: orthostasis and ?'d if compression hose or abd binder would be indicated. Educated pt and NT on importance of sitting up in chair and use of BSC to promote standing (noting that she has fallen twice while sitting on BSC)      Pertinent Vitals/Pain Pain Assessment: Faces Faces Pain Scale: No hurt (wearing ASO, not cam boot)    Home Living                      Prior Function  PT Goals (current goals can now be found in the care plan section) Acute Rehab PT Goals Patient Stated Goal: my blood pressure to act right Time For Goal Achievement: 04/20/20 Potential to Achieve Goals: Fair Progress towards PT goals: Progressing toward goals    Frequency    Min 2X/week      PT Plan Current plan remains appropriate    Co-evaluation              AM-PAC PT "6 Clicks" Mobility   Outcome Measure  Help needed turning from your back to your side while in a flat bed without using bedrails?: A Little Help needed moving from lying on your back to sitting on the side of a flat bed without using bedrails?: A Little Help needed moving to and from a bed to a  chair (including a wheelchair)?: A Little Help needed standing up from a chair using your arms (e.g., wheelchair or bedside chair)?: A Little Help needed to walk in hospital room?: A Lot Help needed climbing 3-5 steps with a railing? : Total 6 Click Score: 15    End of Session Equipment Utilized During Treatment: Gait belt Activity Tolerance: Treatment limited secondary to medical complications (Comment) (+ orthostatics) Patient left: with call bell/phone within reach;in chair;with chair alarm set (on stretcher in ED) Nurse Communication: Mobility status;Other (comment) (no purewick in right now; rec [facility]) PT Visit Diagnosis: Unsteadiness on feet (R26.81);Muscle weakness (generalized) (M62.81);Difficulty in walking, not elsewhere classified (R26.2)     Time: 8828-0034 PT Time Calculation (min) (ACUTE ONLY): 45 min  Charges:  $Therapeutic Activity: 38-52 mins                      Arby Barrette, PT Pager [phone]    Rexanne Mano 04/10/2020, 9:51 AM

## 2020-04-10 NOTE — Progress Notes (Signed)
Progress Note    Jennifer ALICIA  [name]:[id number] DOB: [birth year]  DOA: 04/06/2020 PCP: Hulan Fess, MD    Brief Narrative:     Medical records reviewed and are as summarized below:  [name] is an 87 y.o. female with medical history significant of prosthetic joint infection; remote ovarian CA; HTN; and HLD presenting with syncope.  The patient reports that she was feeling well yesterday but took her evening BP medication later than usual (3 -> 6pm) and took her usual Ambien.  She thinks she fell asleep in her recliner.  She got up to have a BM about 1245 and collapsed to the floor while on the commode.  She does not think she lost consciousness, but she struck her face and nose.  She was brought to the ER and felt somewhat dizzy when she need to go to the restroom; when she got out of bed she was increasingly dizzy and again became presyncopal. She did injure her R ankle with falling.   She was seen in the ER on 1/21 with diarrhea and presyncope.  It was a similar episode to now.  She was seen by her PCP on 2/21 and there was discussion about maybe needing to change her BP medications.   Assessment/Plan:   Principal Problem:   Syncope and collapse Active Problems:   Essential hypertension   Chronic kidney disease (CKD), stage III (moderate) (HCC)   Fall at home, initial encounter   Acute right ankle pain   Orthostatic hypotension   Syncope -Most likely associated with orthostasis and/or micturitional syncope -Ambien may also be a contributing factor -Patients at highest risk from syncope include those with serious comorbidities; age >14; exertional > supine syncope; palpitations; abnormal EKG; abnormal vital signs; or abnormal exam.  Her age is her only such RF. -orthos daily-- negative on 2/27-- repeating 2/28 and were negative- add TED hose and encourage PO intake -Negative troponins x2  -echo similar to prior -TSH ok, cortisol normal  Fall -Due to  syncope -Has facial injuries that appear to be superficial/hemostatic at this time- suspected concussion  R ankle/foot pain -Injured in fall -No fracture on xray of ankle -x ray of foot: Subtle transverse lucencies at the base of the third and fourth metatarsals on the oblique view likely within normal, although could represent subtle nondisplaced fractures. Consider follow-up radiograph 7-10 days. -recent sprain in Jan -- seen by podiatry -ordered ankle brace -PT/OT consults- able to bear weight today -will need SNF  HTN -Hold norvasc and nadolol-- continue lisinopril -She usually takes Norvasc, lisinopril, and nadolol -Her BP goal should likely be <160 based on age  Stage 3a CKD -Appears to be stable at this time  hypomagnesemia -replete IV  Family Communication/Anticipated D/C date and plan/Code Status   DVT prophylaxis: scd Code Status: Full Code.  Family Communication: called daughter 2/28 Disposition Plan: Status is: Inpatient  Remains inpatient appropriate because:Unsafe d/c plan and Inpatient level of care appropriate due to severity of illness   Dispo: The patient is from: Home              Anticipated d/c is to: SNF in AM              Patient currently is medically stable to d/c.   Difficult to place patient No         Medical Consultants:    None.     Subjective:   Says she is starting to feel better  and eat better as well  Objective:    Vitals:   04/10/20 0504 04/10/20 0910 04/10/20 0916 04/10/20 1215  BP: (!) 158/84 (!) 144/70 130/71 138/71  Pulse: 88 97 97 77  Resp: 19   18  Temp: 98.6 F (37 C)   98.6 F (37 C)  TempSrc: Oral   Oral  SpO2: 98%   99%  Weight: 77.5 kg     Height:        Intake/Output Summary (Last 24 hours) at 04/10/2020 1336 Last data filed at 04/10/2020 1000 Gross per 24 hour  Intake 240 ml  Output 850 ml  Net -610 ml   Filed Weights   04/08/20 0343 04/09/20 0422 04/10/20 0504  Weight: 76.1 kg 75.5 kg  77.5 kg    Exam:  General: Appearance:    Obese female in no acute distress     Lungs:      respirations unlabored  Heart:    Normal heart rate. Normal rhythm. No murmurs, rubs, or gallops.   MS:   All extremities are intact.   Neurologic:   Awake, alert, pleasant and cooperative        Data Reviewed:   I have personally reviewed following labs and imaging studies:  Labs: Labs show the following:   Basic Metabolic Panel: Recent Labs  Lab 04/06/20 0207 04/07/20 0220 04/08/20 0322 04/09/20 0159  NA 131* 130* 129* 130*  K 4.6 4.0 4.1 4.3  CL 100 102 97* 99  CO2 20* 20* 22 23  GLUCOSE 136* 118* 94 105*  BUN 23 12 12 18   CREATININE 1.12* 0.75 0.87 0.85  CALCIUM 9.4 8.5* 8.6* 8.6*  MG  --   --  1.3*  --    GFR Estimated Creatinine Clearance: 45.1 mL/min (by C-G formula based on SCr of 0.85 mg/dL). Liver Function Tests: No results for input(s): AST, ALT, ALKPHOS, BILITOT, PROT, ALBUMIN in the last 168 hours. No results for input(s): LIPASE, AMYLASE in the last 168 hours. No results for input(s): AMMONIA in the last 168 hours. Coagulation profile No results for input(s): INR, PROTIME in the last 168 hours.  CBC: Recent Labs  Lab 04/06/20 0207 04/07/20 0220 04/09/20 0159  WBC 11.7* 8.8 7.3  NEUTROABS 9.2*  --   --   HGB 14.1 10.9* 10.8*  HCT 42.6 30.9* 33.1*  MCV 86.6 82.4 85.3  PLT 281 225 257   Cardiac Enzymes: No results for input(s): CKTOTAL, CKMB, CKMBINDEX, TROPONINI in the last 168 hours. BNP (last 3 results) No results for input(s): PROBNP in the last 8760 hours. CBG: No results for input(s): GLUCAP in the last 168 hours. D-Dimer: No results for input(s): DDIMER in the last 72 hours. Hgb A1c: No results for input(s): HGBA1C in the last 72 hours. Lipid Profile: No results for input(s): CHOL, HDL, LDLCALC, TRIG, CHOLHDL, LDLDIRECT in the last 72 hours. Thyroid function studies: No results for input(s): TSH, T4TOTAL, T3FREE, THYROIDAB in the last  72 hours.  Invalid input(s): FREET3 Anemia work up: No results for input(s): VITAMINB12, FOLATE, FERRITIN, TIBC, IRON, RETICCTPCT in the last 72 hours. Sepsis Labs: Recent Labs  Lab 04/06/20 0207 04/07/20 0220 04/09/20 0159  WBC 11.7* 8.8 7.3    Microbiology Recent Results (from the past 240 hour(s))  SARS CORONAVIRUS 2 (TAT 6-24 HRS) Nasopharyngeal Nasopharyngeal Swab     Status: None   Collection Time: 04/06/20 10:34 AM   Specimen: Nasopharyngeal Swab  Result Value Ref Range Status   SARS Coronavirus 2  NEGATIVE NEGATIVE Final    Comment: (NOTE) SARS-CoV-2 target nucleic acids are NOT DETECTED.  The SARS-CoV-2 RNA is generally detectable in upper and lower respiratory specimens during the acute phase of infection. Negative results do not preclude SARS-CoV-2 infection, do not rule out co-infections with other pathogens, and should not be used as the sole basis for treatment or other patient management decisions. Negative results must be combined with clinical observations, patient history, and epidemiological information. The expected result is Negative.  Fact Sheet for Patients: SugarRoll.be  Fact Sheet for Healthcare Providers: https://www.woods-mathews.com/  This test is not yet approved or cleared by the [place] FDA and  has been authorized for detection and/or diagnosis of SARS-CoV-2 by FDA under an Emergency Use Authorization (EUA). This EUA will remain  in effect (meaning this test can be used) for the duration of the COVID-19 declaration under Se ction 564(b)(1) of the Act, 21 U.S.C. section 360bbb-3(b)(1), unless the authorization is terminated or revoked sooner.  Performed at [facility], [place] [address], [place], [place] [address]     Procedures and diagnostic studies:  No results found.  Medications:   . acetaminophen  1,000 mg Oral TID  . B-complex with vitamin C  1 tablet Oral Daily  .  cholecalciferol  2,000 Units Oral Daily  . cycloSPORINE  1 drop Both Eyes BID  . diclofenac Sodium  2 g Topical QID  . docusate sodium  100 mg Oral BID  . enoxaparin (LOVENOX) injection  40 mg Subcutaneous Q24H  . lisinopril  20 mg Oral Daily  . melatonin  3 mg Oral QHS  . multivitamin with minerals  1 tablet Oral Daily  . sodium chloride flush  3 mL Intravenous Q12H   Continuous Infusions:    LOS: 3 days   Geradine Girt  [facility]   How to contact the [facility] Attending or Consulting provider [place] or covering provider during after hours [place], for this patient?  1. Check the care team in [facility] and look for a) attending/consulting TRH provider listed and b) the [facility] team listed 2. Log into [email or web address] and use [place]'s universal password to access. If you do not have the password, please contact the hospital operator. 3. Locate the [facility] provider you are looking for under [facility] and page to a number that you can be directly reached. 4. If you still have difficulty reaching the provider, please page the [facility] (Director on Call) for the Hospitalists listed on amion for assistance.  04/10/2020, 1:36 PM

## 2020-04-10 NOTE — TOC Progression Note (Signed)
Transition of Care [facility]) - Progression Note    Patient Details  Name: LIBERTY SETO MRN: [id number] Date of Birth: [birth year]  Transition of Care [facility]) CM/SW [place], [place] Phone Number: 04/10/2020, 3:41 PM  Clinical Narrative:     CSW spoke to pts daughter Almyra Free by phone. CSW informed Almyra Free that are planning to DC pt to [facility] [place] tomorrow. Almyra Free requested pt to eat lunch here before discharging. Almyra Free inquired if she can transport pt by car. PT/OT stated it is not reccommended. Almyra Free states that's fine but would like for MD to document that non emergency ambulance is recommended.   Almyra Free had many billing questions. CSW gave Almyra Free the number to [facility] billing.   Pts covid test is still pending.  Expected Discharge Plan: [place] Barriers to Discharge: Continued Medical Work up  Expected Discharge Plan and Services Expected Discharge Plan: [place] arrangements for the past 2 months: [place]                                       Social Determinants of Health (SDOH) Interventions    Readmission Risk Interventions No flowsheet data found.  Emeterio Reeve, Latanya Presser, [place] Social Worker [phone]

## 2020-04-10 NOTE — Progress Notes (Signed)
Occupational Therapy Treatment Patient Details Name: [name] MRN: [id number] DOB: [birth year] Today's Date: 04/10/2020    History of present illness Pt is an 87 y/o female admitted following syncopal episode at [place]. Pt also had another episode while in ED after using bathroom. Pt complaining of R ankle pain, however, imaging negative. Per notes, pt with possible concussion. PMH includes ovarian CA; HTN; and HLD.   OT comments  Pt progressing well, but continues to have memory deficits. Pt with pain, but minimal in RLE and supported by ankle brace. Pt not happy with complexity of ankle ASO and really wants an easier one that slides on. Pt supervisionA to minguardA today for ADL.  Pt provided washcloth for hand washing in sitting. Pt wanting to return to bed, but pt education for sitting in recliner for atleast an hour. BP soft, 101/55 in sitting post exercise; no dizziness reported throughout. Pt would benefit from continued OT skilled services. OT following acutely.    Follow Up Recommendations  SNF;Supervision/Assistance - 24 hour    Equipment Recommendations  3 in 1 bedside commode    Recommendations for Other Services      Precautions / Restrictions Precautions Precautions: Fall;Other (comment) Precaution Comments: watch BP- orthostasis Required Braces or Orthoses: Other Brace Other Brace: rt ASO Restrictions Weight Bearing Restrictions: No       Mobility Bed Mobility Overal bed mobility: Needs Assistance Bed Mobility: Supine to Sit     Supine to sit: Min guard;HOB elevated     General bed mobility comments: MinguardA with HOB elevated and rail (for safety due to orthostasis    Transfers Overall transfer level: Needs assistance Equipment used: 4-wheeled walker Transfers: Sit to/from [facility] Sit to Stand: Min guard Stand pivot transfers: Min guard       General transfer comment: minguardA to avoid plopping and cues for hand  placement    Balance Overall balance assessment: Needs assistance Sitting-balance support: No upper extremity supported Sitting balance-Leahy Scale: Fair     Standing balance support: Bilateral upper extremity supported Standing balance-Leahy Scale: Poor Standing balance comment: reliant on RW,                           ADL either performed or assessed with clinical judgement   ADL Overall ADL's : Needs assistance/impaired                     Lower Body Dressing: Set up;Bed level Lower Body Dressing Details (indicate cue type and reason): donning velcro shoes with figure 4 technique; unable to done R ASO without assist Toilet Transfer: Min guard;Ambulation;RW;BSC Toilet Transfer Details (indicate cue type and reason): BSC over commode; continual cues for hand placement Toileting- Clothing Manipulation and Hygiene: Min guard;Sitting/lateral lean;Sit to/from stand Toileting - Clothing Manipulation Details (indicate cue type and reason): Pt sitting to perform toilet hygiene and standing x1 time, btu pt fearful to let go of her rollator.     Functional mobility during ADLs: Min guard;Cueing for safety;Cueing for sequencing;Rolling walker (used her rollator) General ADL Comments: Pt with pain, but minimal in RLE and supported by ankle brace. Pt not happy with complexity of ankle ASO and really wants an easier one that slides on. Pt supervisionA to minguardA today for ADL.  Pt provided washcloth for hand washing in sitting. Pt wanting to return to bed, but pt education for sitting in recliner for atleast an hour.  Vision       Perception     Praxis      Cognition Arousal/Alertness: Awake/alert Behavior During Therapy: WFL for tasks assessed/performed Overall Cognitive Status: No family/caregiver present to determine baseline cognitive functioning                                 General Comments: Pt appears to have memory deficit and confused  this OTR with a PT from earlier today. Pt perseverating on the fact that she may not need OT at the facility and OTR providing education that the OT will most likely see her at least 1 time for safety, but unable to converse more deeply as pt would ask the same question again.        Exercises     Shoulder Instructions       General Comments BP soft, 101/55 in sitting post exercise; no dizziness reported throughout    Pertinent Vitals/ Pain       Pain Assessment: Faces Faces Pain Scale: Hurts little more Pain Location: R ankle Pain Descriptors / Indicators: Discomfort;Pressure Pain Intervention(s): Monitored during session;Premedicated before session;Repositioned  Home Living                                          Prior Functioning/Environment              Frequency  Min 2X/week        Progress Toward Goals  OT Goals(current goals can now be found in the care plan section)  Progress towards OT goals: Progressing toward goals  Acute Rehab OT Goals Patient Stated Goal: my blood pressure to act right OT Goal Formulation: With patient Time For Goal Achievement: 04/21/20 Potential to Achieve Goals: Fair ADL Goals Pt Will Perform Lower Body Dressing: with min assist;sit to/from stand Pt Will Transfer to Toilet: with min guard assist;stand pivot transfer;bedside commode Pt Will Perform Toileting - Clothing Manipulation and hygiene: with min guard assist;sitting/lateral leans;sit to/from stand Additional ADL Goal #1: Pt will participate in x10 mins of OOB ADL tasks with 2 seated rest breaks in order to increase independence. Additional ADL Goal #2: Pt will participate in higher level cognition to assess safety for medication management.  Plan Discharge plan remains appropriate    Co-evaluation                 AM-PAC OT "6 Clicks" Daily Activity     Outcome Measure   Help from another person eating meals?: None Help from another person  taking care of personal grooming?: A Little Help from another person toileting, which includes using toliet, bedpan, or urinal?: A Little Help from another person bathing (including washing, rinsing, drying)?: A Little Help from another person to put on and taking off regular upper body clothing?: A Little Help from another person to put on and taking off regular lower body clothing?: A Little 6 Click Score: 19    End of Session Equipment Utilized During Treatment: Gait belt;Rolling walker  OT Visit Diagnosis: Unsteadiness on feet (R26.81);Muscle weakness (generalized) (M62.81);Pain;Other symptoms and signs involving cognitive function   Activity Tolerance Patient limited by fatigue;Patient limited by pain   Patient Left in chair;with call bell/phone within reach;with chair alarm set   Nurse Communication Mobility status        Time: 1236-1300  OT Time Calculation (min): 24 min  Charges: OT General Charges $OT Visit: 1 Visit OT Treatments $Self Care/Home Management : 8-22 mins $Therapeutic Activity: 8-22 mins  Jefferey Pica, OTR/L Acute Rehabilitation Services Pager: [phone] Office: [phone]    [name] C 04/10/2020, 6:09 PM

## 2020-04-11 DIAGNOSIS — I951 Orthostatic hypotension: Secondary | ICD-10-CM | POA: Diagnosis present

## 2020-04-11 DIAGNOSIS — R079 Chest pain, unspecified: Secondary | ICD-10-CM | POA: Diagnosis not present

## 2020-04-11 DIAGNOSIS — S92334D Nondisplaced fracture of third metatarsal bone, right foot, subsequent encounter for fracture with routine healing: Secondary | ICD-10-CM | POA: Diagnosis not present

## 2020-04-11 DIAGNOSIS — I491 Atrial premature depolarization: Secondary | ICD-10-CM | POA: Diagnosis not present

## 2020-04-11 DIAGNOSIS — I959 Hypotension, unspecified: Secondary | ICD-10-CM | POA: Diagnosis not present

## 2020-04-11 DIAGNOSIS — E871 Hypo-osmolality and hyponatremia: Secondary | ICD-10-CM | POA: Diagnosis not present

## 2020-04-11 DIAGNOSIS — R5381 Other malaise: Secondary | ICD-10-CM | POA: Diagnosis not present

## 2020-04-11 DIAGNOSIS — C569 Malignant neoplasm of unspecified ovary: Secondary | ICD-10-CM | POA: Diagnosis not present

## 2020-04-11 DIAGNOSIS — M25571 Pain in right ankle and joints of right foot: Secondary | ICD-10-CM | POA: Diagnosis not present

## 2020-04-11 DIAGNOSIS — R55 Syncope and collapse: Secondary | ICD-10-CM | POA: Diagnosis not present

## 2020-04-11 DIAGNOSIS — N183 Chronic kidney disease, stage 3 unspecified: Secondary | ICD-10-CM | POA: Diagnosis not present

## 2020-04-11 DIAGNOSIS — Z20822 Contact with and (suspected) exposure to covid-19: Secondary | ICD-10-CM | POA: Diagnosis present

## 2020-04-11 DIAGNOSIS — I700 Atherosclerosis of aorta: Secondary | ICD-10-CM | POA: Diagnosis present

## 2020-04-11 DIAGNOSIS — R0789 Other chest pain: Secondary | ICD-10-CM | POA: Diagnosis not present

## 2020-04-11 DIAGNOSIS — Z880 Allergy status to penicillin: Secondary | ICD-10-CM | POA: Diagnosis not present

## 2020-04-11 DIAGNOSIS — E46 Unspecified protein-calorie malnutrition: Secondary | ICD-10-CM | POA: Diagnosis present

## 2020-04-11 DIAGNOSIS — M79676 Pain in unspecified toe(s): Secondary | ICD-10-CM

## 2020-04-11 DIAGNOSIS — F064 Anxiety disorder due to known physiological condition: Secondary | ICD-10-CM | POA: Diagnosis present

## 2020-04-11 DIAGNOSIS — K449 Diaphragmatic hernia without obstruction or gangrene: Secondary | ICD-10-CM | POA: Diagnosis present

## 2020-04-11 DIAGNOSIS — Z8543 Personal history of malignant neoplasm of ovary: Secondary | ICD-10-CM | POA: Diagnosis not present

## 2020-04-11 DIAGNOSIS — F5101 Primary insomnia: Secondary | ICD-10-CM | POA: Diagnosis not present

## 2020-04-11 DIAGNOSIS — Z8249 Family history of ischemic heart disease and other diseases of the circulatory system: Secondary | ICD-10-CM | POA: Diagnosis not present

## 2020-04-11 DIAGNOSIS — I350 Nonrheumatic aortic (valve) stenosis: Secondary | ICD-10-CM | POA: Diagnosis present

## 2020-04-11 DIAGNOSIS — E222 Syndrome of inappropriate secretion of antidiuretic hormone: Secondary | ICD-10-CM | POA: Diagnosis present

## 2020-04-11 DIAGNOSIS — K429 Umbilical hernia without obstruction or gangrene: Secondary | ICD-10-CM | POA: Diagnosis present

## 2020-04-11 DIAGNOSIS — Z743 Need for continuous supervision: Secondary | ICD-10-CM | POA: Diagnosis not present

## 2020-04-11 DIAGNOSIS — R279 Unspecified lack of coordination: Secondary | ICD-10-CM | POA: Diagnosis not present

## 2020-04-11 DIAGNOSIS — Z6829 Body mass index (BMI) 29.0-29.9, adult: Secondary | ICD-10-CM | POA: Diagnosis not present

## 2020-04-11 DIAGNOSIS — M8949 Other hypertrophic osteoarthropathy, multiple sites: Secondary | ICD-10-CM | POA: Diagnosis not present

## 2020-04-11 DIAGNOSIS — N179 Acute kidney failure, unspecified: Secondary | ICD-10-CM | POA: Diagnosis present

## 2020-04-11 DIAGNOSIS — Z833 Family history of diabetes mellitus: Secondary | ICD-10-CM | POA: Diagnosis not present

## 2020-04-11 DIAGNOSIS — I2510 Atherosclerotic heart disease of native coronary artery without angina pectoris: Secondary | ICD-10-CM | POA: Diagnosis present

## 2020-04-11 DIAGNOSIS — I10 Essential (primary) hypertension: Secondary | ICD-10-CM | POA: Diagnosis present

## 2020-04-11 DIAGNOSIS — Z96651 Presence of right artificial knee joint: Secondary | ICD-10-CM | POA: Diagnosis present

## 2020-04-11 DIAGNOSIS — E861 Hypovolemia: Secondary | ICD-10-CM | POA: Diagnosis present

## 2020-04-11 DIAGNOSIS — Z8052 Family history of malignant neoplasm of bladder: Secondary | ICD-10-CM | POA: Diagnosis not present

## 2020-04-11 DIAGNOSIS — S92344D Nondisplaced fracture of fourth metatarsal bone, right foot, subsequent encounter for fracture with routine healing: Secondary | ICD-10-CM | POA: Diagnosis not present

## 2020-04-11 DIAGNOSIS — J90 Pleural effusion, not elsewhere classified: Secondary | ICD-10-CM | POA: Diagnosis not present

## 2020-04-11 DIAGNOSIS — I499 Cardiac arrhythmia, unspecified: Secondary | ICD-10-CM | POA: Diagnosis not present

## 2020-04-11 DIAGNOSIS — K219 Gastro-esophageal reflux disease without esophagitis: Secondary | ICD-10-CM | POA: Diagnosis present

## 2020-04-11 DIAGNOSIS — D62 Acute posthemorrhagic anemia: Secondary | ICD-10-CM | POA: Diagnosis not present

## 2020-04-11 DIAGNOSIS — W19XXXD Unspecified fall, subsequent encounter: Secondary | ICD-10-CM | POA: Diagnosis present

## 2020-04-11 MED ORDER — DICLOFENAC SODIUM 1 % EX GEL: 2.0000 g | Freq: Four times a day (QID) | CUTANEOUS | Status: DC

## 2020-04-11 MED ORDER — MECLIZINE HCL 12.5 MG PO TABS: 12.5000 mg | ORAL_TABLET | Freq: Two times a day (BID) | ORAL | 0 refills | Status: DC | PRN

## 2020-04-11 MED ORDER — MELATONIN 3 MG PO TABS: 3.0000 mg | ORAL_TABLET | Freq: Every day | ORAL | 0 refills | Status: DC

## 2020-04-11 MED ORDER — ACETAMINOPHEN 500 MG PO TABS: 1000.0000 mg | ORAL_TABLET | Freq: Three times a day (TID) | ORAL | 0 refills | Status: DC

## 2020-04-11 NOTE — Progress Notes (Signed)
Patient needs continued encouragement and re-assurance that she needs to be getting out of bed and working with PT.  Her issues of dizziness and orthostasis are ONLY going to continue to worsen with bed rest. Patient needs aggressive PT while wearing TED hose and abdominal binder.  Suspect patient may have concussive type symptoms that are worsening her reported dizziness.  No neuro deficits. Eulogio Bear

## 2020-04-11 NOTE — TOC Transition Note (Addendum)
Transition of Care [facility]) - CM/SW Discharge Note   Patient Details  Name: [name] MRN: [id number] Date of Birth: 1930-01-30  Transition of Care [facility]) CM/SW Contact:  Emeterio Reeve, [place] Phone Number: 04/11/2020, 9:52 AM   Clinical Narrative:     [facility] [place] [facility]. Pt and daughter Almyra Free have been notified. Pt is covid negative. CSW will call ptar after pt eats lunch per daughters request. Ptar has been requested for 11:30am.  Pt will go to room 4. Nurse to call report to [phone] ex [phone]  Final next level of care: Skilled Nursing Facility Barriers to Discharge: Barriers Resolved   Patient Goals and CMS Choice Patient states their goals for this hospitalization and ongoing recovery are:: [facility] to [facility] CMS Medicare.gov Compare Post Acute Care list provided to:: Patient Choice offered to / list presented to : Adult Children  Discharge Placement              Patient chooses bed at: [facility] Patient to be transferred to facility by: [place] Name of family member notified: Almyra Free, Daughter Patient and family notified of of transfer: 04/11/20  Discharge Plan and Services                                     Social Determinants of Health (SDOH) Interventions     Readmission Risk Interventions No flowsheet data found.  Emeterio Reeve, Latanya Presser, [place] Social Worker [phone]

## 2020-04-11 NOTE — Progress Notes (Signed)
Attempt to do orthostatic vital signs prior to discharge. Pt daughter refusing at this time commenting she prefers that her mother to eat to give her more strength. Informed pt/daughter [name] will arrive at 1130 or after for transport back to [facility]. Will perform orthostatics after pt has finished eating lunch.

## 2020-04-11 NOTE — Care Management Important Message (Signed)
Patient left Prior to IM delivery Important Message  Patient Details  Name: [name] MRN: [id number] Date of Birth: 04/18/1929   Medicare Important Message Given:  Yes  Patient left prior to IM delivery will mail IM to the patient home address.    [name] 04/11/2020, 2:52 PM

## 2020-04-11 NOTE — Discharge Summary (Addendum)
Physician Discharge Summary  [name]:[id number] DOB: [birth year] DOA: 04/06/2020  PCP: Hulan Fess, MD  Admit date: 04/06/2020 Discharge date: 04/11/2020  Admitted From: home/ILF Discharge disposition: SNF   Recommendations for Outpatient Follow-Up:   1. TED hose/abdominal binder 2. Monitor orthostatics- allow for higher supine BP due to orthostatic hypotension 3. Monitor pain in right foot- may need repeat x ray-- continue ankle brace 4. Allow steri-strips to fall off-- can wash face 5. Bowel regimen to avoid constipation 6. Can consider midodrine if continues to have orthostatic issues 7. Elevate head of bed to 20 deg while sleeping 8. Needs increased acitivity   Discharge Diagnosis:   Principal Problem:   Syncope and collapse Active Problems:   Essential hypertension   Chronic kidney disease (CKD), stage III (moderate) (HCC)   Fall at home, initial encounter   Acute right ankle pain   Orthostatic hypotension    Discharge Condition: Improved.  Diet recommendation:  Regular.  Wound care: None.  Code status: Full.   History of Present Illness:  [name] is a 87 y.o. female with medical history significant of prosthetic joint infection; remote ovarian CA; HTN; and HLD presenting with syncope.  The patient reports that she was feeling well yesterday but took her evening BP medication later than usual (3 -> 6pm) and took her usual Ambien.  She thinks she fell asleep in her recliner.  She got up to have a BM about 1245 and collapsed to the floor while on the commode.  She does not think she lost consciousness, but she struck her face and nose.  She was brought to the ER and felt somewhat dizzy when she need to go to the restroom; when she got out of bed she was increasingly dizzy and again became presyncopal. She did injure her R ankle with falling.   She was seen in the ER on 1/21 with diarrhea and presyncope.  It was a similar episode to now.   She was seen by her PCP on 2/21 and there was discussion about maybe needing to change her BP medications.   Hospital Course by Problem:   Syncope -Most likely associated with orthostasis and/or micturitional syncope -Ambien may also be a contributing factor -Patients at highest risk from syncope include those with serious comorbidities; age >58; exertional > supine syncope; palpitations; abnormal EKG; abnormal vital signs; or abnormal exam.Her age is her only such RF. -orthos daily-- negative on 2/27-- repeating 2/28 and were negative- add TED hose and encourage PO intake -Negativetroponins x2  -echo similar to prior -TSH ok, cortisol normal -will need ambulance transfer to SNF due to continued issues with orthostatics and high fall risk  Fall -Due to syncope -Has facial injuries that appear to be superficial/hemostatic at this time- suspected concussion  R ankle/foot pain -Injured in fall -No fracture on xray of ankle -x ray of foot: Subtle transverse lucencies at the base of the third and fourth metatarsals on the oblique view likely within normal, although could represent subtle nondisplaced fractures. Consider follow-up radiograph 7-10 days. -recent sprain in Jan -- seen by podiatry -ordered ankle brace -PT/OT consults- able to bear weight today -will need SNF  HTN -Hold norvasc and nadolol-- continue lisinopril -She usually takes Norvasc, lisinopril, and nadolol -Her BP goal should likely be <160 based on age  Stage 3a CKD -Appears to be stable at this time  hypomagnesemia -replete IV     Medical Consultants:  Discharge Exam:   Vitals:   04/11/20 0041 04/11/20 0546  BP: 120/60 (!) 152/79  Pulse: 80 83  Resp: 18 18  Temp: 97.6 F (36.4 C) 98 F (36.7 C)  SpO2: 96% 96%   Vitals:   04/10/20 1215 04/10/20 1701 04/11/20 0041 04/11/20 0546  BP: 138/71 135/65 120/60 (!) 152/79  Pulse: 77 98 80 83  Resp: 18 18 18 18   Temp: 98.6 F (37 C)  98.5 F (36.9 C) 97.6 F (36.4 C) 98 F (36.7 C)  TempSrc: Oral Oral Oral Oral  SpO2: 99% 99% 96% 96%  Weight:    76 kg  Height:        General exam: Appears calm and comfortable.    The results of significant diagnostics from this hospitalization (including imaging, microbiology, ancillary and laboratory) are listed below for reference.     Procedures and Diagnostic Studies:   DG Ankle Complete Right  Result Date: 04/06/2020 CLINICAL DATA:  Syncopal episode. EXAM: RIGHT ANKLE - COMPLETE 3+ VIEW COMPARISON:  June 09, 2017 FINDINGS: There is no evidence of an acute fracture, dislocation, or joint effusion. A chronic deformity is seen involving the distal shaft of the right fibula. There is a moderate-sized plantar calcaneal spur. Soft tissues are unremarkable. IMPRESSION: Chronic deformity of the distal right fibula. Electronically Signed   By: Virgina Norfolk M.D.   On: 04/06/2020 02:45   CT HEAD WO CONTRAST  Result Date: 04/06/2020 CLINICAL DATA:  Syncopal episode. EXAM: CT HEAD WITHOUT CONTRAST TECHNIQUE: Contiguous axial images were obtained from the base of the skull through the vertex without intravenous contrast. COMPARISON:  March 02, 2020 FINDINGS: Brain: There is mild cerebral atrophy with widening of the extra-axial spaces and ventricular dilatation. There are areas of decreased attenuation within the white matter tracts of the supratentorial brain, consistent with microvascular disease changes. A stable 1 cm para falcine calcified right occipital lobe meningioma is noted. Vascular: No hyperdense vessels are identified. Skull: Nondisplaced bilateral nasal bone fractures of indeterminate age are seen. Sinuses/Orbits: Marked severity sphenoid sinus mucosal thickening is present. Other: Mild to moderate severity frontal scalp soft tissue swelling is noted along the midline. IMPRESSION: 1. Mild to moderate severity frontal scalp soft tissue swelling without evidence of an acute  fracture or acute intracranial abnormality. 2. Stable 1 cm para falcine calcified right occipital lobe meningioma. 3. Bilateral nondisplaced nasal bone fractures of indeterminate age. Electronically Signed   By: Virgina Norfolk M.D.   On: 04/06/2020 02:42   ECHOCARDIOGRAM COMPLETE  Result Date: 04/06/2020    ECHOCARDIOGRAM REPORT   Patient Name:   [name] [facility] Date of Exam: 04/06/2020 Medical Rec #:  [id number]        Height:       63.0 in Accession #:    [id number]       Weight:       155.0 lb Date of Birth:  [birth year]        BSA:          1.735 m Patient Age:    53 years         BP:           140/64 mmHg Patient Gender: F                HR:           84 bpm. Exam Location:  Inpatient Procedure: 2D Echo, Cardiac Doppler and Color Doppler Indications:    R55 Syncope  History:  Patient has prior history of Echocardiogram examinations, most                 recent 01/04/2019. Signs/Symptoms:Murmur, Shortness of Breath                 and Dyspnea; Risk Factors:Hypertension, Dyslipidemia and Former                 Smoker.  Sonographer:    Roseanna Rainbow RDCS Referring Phys: [id number] JENNIFER YATES  Sonographer Comments: Technically difficult study due to poor echo windows. Patient had recent fall, could not turn. Patient sensitive to pressure from probe in apical region. IMPRESSIONS  1. Left ventricular ejection fraction, by estimation, is 70 to 75%. The left ventricle has hyperdynamic function. The left ventricle has no regional wall motion abnormalities. There is mild left ventricular hypertrophy. Left ventricular diastolic parameters are consistent with Grade I diastolic dysfunction (impaired relaxation).  2. Right ventricular systolic function is normal. The right ventricular size is normal.  3. The mitral valve is normal in structure. No evidence of mitral valve regurgitation. No evidence of mitral stenosis.  4. The aortic valve is tricuspid. Aortic valve regurgitation is mild. Mild aortic valve stenosis.  Aortic valve area, by VTI measures 1.91 cm. Aortic valve mean gradient measures 10.0 mmHg. Aortic valve Vmax measures 2.12 m/s.  5. The inferior vena cava is normal in size with greater than 50% respiratory variability, suggesting right atrial pressure of 3 mmHg. FINDINGS  Left Ventricle: Left ventricular ejection fraction, by estimation, is 70 to 75%. The left ventricle has hyperdynamic function. The left ventricle has no regional wall motion abnormalities. The left ventricular internal cavity size was normal in size. There is mild left ventricular hypertrophy. Left ventricular diastolic parameters are consistent with Grade I diastolic dysfunction (impaired relaxation). Right Ventricle: The right ventricular size is normal. No increase in right ventricular wall thickness. Right ventricular systolic function is normal. Left Atrium: Left atrial size was normal in size. Right Atrium: Right atrial size was normal in size. Pericardium: There is no evidence of pericardial effusion. Mitral Valve: The mitral valve is normal in structure. Mild mitral annular calcification. No evidence of mitral valve regurgitation. No evidence of mitral valve stenosis. Tricuspid Valve: The tricuspid valve is normal in structure. Tricuspid valve regurgitation is not demonstrated. No evidence of tricuspid stenosis. Aortic Valve: The aortic valve is tricuspid. Aortic valve regurgitation is mild. Aortic regurgitation PHT measures 393 msec. Mild aortic stenosis is present. Aortic valve mean gradient measures 10.0 mmHg. Aortic valve peak gradient measures 18.0 mmHg. Aortic valve area, by VTI measures 1.91 cm. Pulmonic Valve: The pulmonic valve was normal in structure. Pulmonic valve regurgitation is not visualized. No evidence of pulmonic stenosis. Aorta: The aortic root is normal in size and structure. Venous: The inferior vena cava is normal in size with greater than 50% respiratory variability, suggesting right atrial pressure of 3 mmHg.  IAS/Shunts: No atrial level shunt detected by color flow Doppler.  LEFT VENTRICLE PLAX 2D LVIDd:         3.40 cm     Diastology LVIDs:         2.00 cm     LV e' medial:    6.96 cm/s LV PW:         1.10 cm     LV E/e' medial:  14.2 LV IVS:        1.30 cm     LV e' lateral:   4.90 cm/s LVOT diam:  1.90 cm     LV E/e' lateral: 20.1 LV SV:         93 LV SV Index:   54 LVOT Area:     2.84 cm  LV Volumes (MOD) LV vol d, MOD A2C: 44.9 ml LV vol d, MOD A4C: 56.2 ml LV vol s, MOD A2C: 9.0 ml LV vol s, MOD A4C: 13.8 ml LV SV MOD A2C:     35.9 ml LV SV MOD A4C:     56.2 ml LV SV MOD BP:      39.8 ml RIGHT VENTRICLE             IVC RV S prime:     20.00 cm/s  IVC diam: 1.90 cm TAPSE (M-mode): 3.2 cm LEFT ATRIUM             Index LA diam:        4.80 cm 2.77 cm/m LA Vol (A2C):   42.8 ml 24.67 ml/m LA Vol (A4C):   36.8 ml 21.21 ml/m LA Biplane Vol: 41.4 ml 23.86 ml/m  AORTIC VALVE AV Area (Vmax):    2.55 cm AV Area (Vmean):   2.25 cm AV Area (VTI):     1.91 cm AV Vmax:           212.00 cm/s AV Vmean:          150.000 cm/s AV VTI:            0.486 m AV Peak Grad:      18.0 mmHg AV Mean Grad:      10.0 mmHg LVOT Vmax:         191.00 cm/s LVOT Vmean:        119.000 cm/s LVOT VTI:          0.328 m LVOT/AV VTI ratio: 0.67 AI PHT:            393 msec  AORTA Ao Root diam: 3.10 cm Ao Asc diam:  3.60 cm MITRAL VALVE MV Area (PHT): 3.72 cm     SHUNTS MV Decel Time: 204 msec     Systemic VTI:  0.33 m MV E velocity: 98.50 cm/s   Systemic Diam: 1.90 cm MV A velocity: 104.00 cm/s MV E/A ratio:  0.95 Candee Furbish MD Electronically signed by Candee Furbish MD Signature Date/Time: 04/06/2020/3:25:34 PM    Final    CT Maxillofacial Wo Contrast  Result Date: 04/06/2020 CLINICAL DATA:  Syncopal episode. EXAM: CT MAXILLOFACIAL WITHOUT CONTRAST TECHNIQUE: Multidetector CT imaging of the maxillofacial structures was performed. Multiplanar CT image reconstructions were also generated. COMPARISON:  None. FINDINGS: Osseous: Bilateral  nondisplaced nasal bone fractures of indeterminate age are seen. Orbits: Negative. No traumatic or inflammatory finding. Sinuses: There is marked severity sphenoid sinus mucosal thickening. Soft tissues: Mild to moderate severity frontal scalp soft tissue swelling is seen, along the midline. Limited intracranial: No significant or unexpected finding. IMPRESSION: 1. Bilateral nondisplaced nasal bone fractures. 2. Mild to moderate severity frontal scalp soft tissue swelling. 3. Marked severity sphenoid sinus mucosal thickening. Electronically Signed   By: Virgina Norfolk M.D.   On: 04/06/2020 02:43     Labs:   Basic Metabolic Panel: Recent Labs  Lab 04/06/20 0207 04/07/20 0220 04/08/20 0322 04/09/20 0159  NA 131* 130* 129* 130*  K 4.6 4.0 4.1 4.3  CL 100 102 97* 99  CO2 20* 20* 22 23  GLUCOSE 136* 118* 94 105*  BUN 23 12 12 18   CREATININE 1.12* 0.75 0.87 0.85  CALCIUM 9.4 8.5* 8.6* 8.6*  MG  --   --  1.3*  --    GFR Estimated Creatinine Clearance: 44.6 mL/min (by C-G formula based on SCr of 0.85 mg/dL). Liver Function Tests: No results for input(s): AST, ALT, ALKPHOS, BILITOT, PROT, ALBUMIN in the last 168 hours. No results for input(s): LIPASE, AMYLASE in the last 168 hours. No results for input(s): AMMONIA in the last 168 hours. Coagulation profile No results for input(s): INR, PROTIME in the last 168 hours.  CBC: Recent Labs  Lab 04/06/20 0207 04/07/20 0220 04/09/20 0159  WBC 11.7* 8.8 7.3  NEUTROABS 9.2*  --   --   HGB 14.1 10.9* 10.8*  HCT 42.6 30.9* 33.1*  MCV 86.6 82.4 85.3  PLT 281 225 257   Cardiac Enzymes: No results for input(s): CKTOTAL, CKMB, CKMBINDEX, TROPONINI in the last 168 hours. BNP: Invalid input(s): POCBNP CBG: No results for input(s): GLUCAP in the last 168 hours. D-Dimer No results for input(s): DDIMER in the last 72 hours. Hgb A1c No results for input(s): HGBA1C in the last 72 hours. Lipid Profile No results for input(s): CHOL, HDL,  LDLCALC, TRIG, CHOLHDL, LDLDIRECT in the last 72 hours. Thyroid function studies No results for input(s): TSH, T4TOTAL, T3FREE, THYROIDAB in the last 72 hours.  Invalid input(s): FREET3 Anemia work up No results for input(s): VITAMINB12, FOLATE, FERRITIN, TIBC, IRON, RETICCTPCT in the last 72 hours. Microbiology Recent Results (from the past 240 hour(s))  SARS CORONAVIRUS 2 (TAT 6-24 HRS) Nasopharyngeal Nasopharyngeal Swab     Status: None   Collection Time: 04/06/20 10:34 AM   Specimen: Nasopharyngeal Swab  Result Value Ref Range Status   SARS Coronavirus 2 NEGATIVE NEGATIVE Final    Comment: (NOTE) SARS-CoV-2 target nucleic acids are NOT DETECTED.  The SARS-CoV-2 RNA is generally detectable in upper and lower respiratory specimens during the acute phase of infection. Negative results do not preclude SARS-CoV-2 infection, do not rule out co-infections with other pathogens, and should not be used as the sole basis for treatment or other patient management decisions. Negative results must be combined with clinical observations, patient history, and epidemiological information. The expected result is Negative.  Fact Sheet for Patients: SugarRoll.be  Fact Sheet for Healthcare Providers: https://www.woods-mathews.com/  This test is not yet approved or cleared by the [place] FDA and  has been authorized for detection and/or diagnosis of SARS-CoV-2 by FDA under an Emergency Use Authorization (EUA). This EUA will remain  in effect (meaning this test can be used) for the duration of the COVID-19 declaration under Se ction 564(b)(1) of the Act, 21 U.S.C. section 360bbb-3(b)(1), unless the authorization is terminated or revoked sooner.  Performed at [facility], [place] [address], [place], [place] [address]   SARS CORONAVIRUS 2 (TAT 6-24 HRS)     Status: None   Collection Time: 04/10/20 12:15 PM  Result Value Ref Range Status    SARS Coronavirus 2 NEGATIVE NEGATIVE Final    Comment: (NOTE) SARS-CoV-2 target nucleic acids are NOT DETECTED.  The SARS-CoV-2 RNA is generally detectable in upper and lower respiratory specimens during the acute phase of infection. Negative results do not preclude SARS-CoV-2 infection, do not rule out co-infections with other pathogens, and should not be used as the sole basis for treatment or other patient management decisions. Negative results must be combined with clinical observations, patient history, and epidemiological information. The expected result is Negative.  Fact Sheet for Patients: SugarRoll.be  Fact Sheet for Healthcare Providers:  https://www.woods-mathews.com/  This test is not yet approved or cleared by the [place] and  has been authorized for detection and/or diagnosis of SARS-CoV-2 by FDA under an Emergency Use Authorization (EUA). This EUA will remain  in effect (meaning this test can be used) for the duration of the COVID-19 declaration under Se ction 564(b)(1) of the Act, 21 U.S.C. section 360bbb-3(b)(1), unless the authorization is terminated or revoked sooner.  Performed at [facility], [place] [address], [place], [place] [address]      Discharge Instructions:   Discharge Instructions    Diet general   Complete by: As directed    Increase activity slowly   Complete by: As directed    No wound care   Complete by: As directed      Allergies as of 04/11/2020      Reactions   Morphine And Related Other (See Comments)   Given after knee replacement and code blue occurred   Penicillins Hives, Itching   syncope   Nsaids Other (See Comments)   abn kidney test   Shellfish Allergy Nausea And Vomiting, Other (See Comments)   Pt has shellfish allergy only.  Has had IV contrast x 2 and did fine.      Medication List    STOP taking these medications   amLODipine 10 MG tablet Commonly known as:  NORVASC   nadolol 20 MG tablet Commonly known as: CORGARD   zolpidem 10 MG tablet Commonly known as: AMBIEN     TAKE these medications   acetaminophen 500 MG tablet Commonly known as: TYLENOL Take 2 tablets (1,000 mg total) by mouth 3 (three) times daily.   azelastine 0.1 % nasal spray Commonly known as: ASTELIN Place 1 spray into both nostrils at bedtime as needed for rhinitis or allergies.   calcium carbonate 750 MG chewable tablet Commonly known as: TUMS EX Chew 750 mg by mouth daily as needed for heartburn.   CENTRUM ADULTS PO Take 1 tablet by mouth daily.   diclofenac Sodium 1 % Gel Commonly known as: VOLTAREN Apply 2 g topically 4 (four) times daily.   docusate sodium 100 MG capsule Commonly known as: COLACE Take 100 mg by mouth daily as needed for mild constipation.   lisinopril 20 MG tablet Commonly known as: ZESTRIL Take 20 mg by mouth daily.   meclizine 12.5 MG tablet Commonly known as: ANTIVERT Take 1 tablet (12.5 mg total) by mouth 2 (two) times daily as needed for dizziness.   melatonin 3 MG Tabs tablet Take 1 tablet (3 mg total) by mouth at bedtime.   Restasis 0.05 % ophthalmic emulsion Generic drug: cycloSPORINE Place 1 drop into both eyes in the morning and at bedtime.   VITAMIN B COMPLEX PO Take 1 tablet by mouth daily.   Vitamin D 50 MCG (2000 UT) tablet Take 2,000 Units by mouth daily.       Follow-up Information    Call  Hulan Fess, MD.   Specialty: Family Medicine Why: to schedule an appointment for close follow up Contact information: [place] [address] [phone]        Call  Hulan Fess, MD.   Specialty: Family Medicine Why: to schedule an appointment for close follow up to discuss possible medication adjustment Contact information: [place] [address] [phone]                Time coordinating discharge: 35 min  Signed:  Tomi Bamberger  Shemia Bevel DO  [facility] 04/11/2020, 8:05 AM

## 2020-04-11 NOTE — Progress Notes (Signed)
PT Cancellation Note  Patient Details Name: [name] MRN: [id number] DOB: 1929/05/30   Cancelled Treatment:    Reason Eval/Treat Not Completed: Other (comment) per RN patient discharging to SNF this morning. Holding PT for now due to upcoming discharge. Thank you for the opportunity to participate in her care!    Windell Norfolk, DPT, PN1   Supplemental Physical Therapist [facility]    Pager [phone] Acute Rehab Office [phone]

## 2020-04-12 ENCOUNTER — Encounter: Payer: Self-pay | Admitting: Internal Medicine

## 2020-04-12 ENCOUNTER — Non-Acute Institutional Stay (SKILLED_NURSING_FACILITY): Payer: Medicare Other | Admitting: Internal Medicine

## 2020-04-12 DIAGNOSIS — F5101 Primary insomnia: Secondary | ICD-10-CM

## 2020-04-12 DIAGNOSIS — M25571 Pain in right ankle and joints of right foot: Secondary | ICD-10-CM | POA: Diagnosis not present

## 2020-04-12 DIAGNOSIS — I10 Essential (primary) hypertension: Secondary | ICD-10-CM

## 2020-04-12 DIAGNOSIS — R55 Syncope and collapse: Secondary | ICD-10-CM

## 2020-04-12 DIAGNOSIS — I951 Orthostatic hypotension: Secondary | ICD-10-CM | POA: Diagnosis not present

## 2020-04-12 DIAGNOSIS — K219 Gastro-esophageal reflux disease without esophagitis: Secondary | ICD-10-CM | POA: Diagnosis not present

## 2020-04-12 DIAGNOSIS — M159 Polyosteoarthritis, unspecified: Secondary | ICD-10-CM

## 2020-04-12 DIAGNOSIS — M8949 Other hypertrophic osteoarthropathy, multiple sites: Secondary | ICD-10-CM

## 2020-04-12 NOTE — Progress Notes (Signed)
Provider:  Veleta Miners MD Location:  [facility] Room Number: 4 Place of Service:  SNF (31)  PCP: Hulan Fess, MD Patient Care Team: Hulan Fess, MD as PCP - General (Family Medicine) Brien Few, MD as Consulting Physician (Obstetrics and Gynecology) Marti Sleigh, MD as Consulting Physician (Gynecology) Nancy Marus, MD as Consulting Physician (Gynecologic Oncology)  Extended Emergency Contact Information Primary Emergency Contact: [facility] Address: [address]          [place], [place] [address] [name] of [place] Phone: [phone] Mobile Phone: [phone] Relation: Daughter  Code Status:  Goals of Care: Advanced Directive information Advanced Directives 04/06/2020  Does Patient Have a Medical Advance Directive? Yes  Type of Paramedic of [place];Living will  Does patient want to make changes to medical advance directive? No - Patient declined  Copy of [place] in Chart? No - copy requested  Would patient like information on creating a medical advance directive? No - Patient declined  Pre-existing out of facility DNR order (yellow form or pink MOST form) -      Chief Complaint  Patient presents with  . New Admit To SNF    Admission to SNF    HPI: Patient is a 87 y.o. female seen today for admission to SNF for therapy  Patient was admitted in the hospital from 2/25-  3/2 after having syncope due to orthostatic hypotension  Patient has a history of hypertension and hyperlipidemia.  History of GERD, hyponatremia  Medications she got up from sleep at night to go to the bathroom when she collapsed while on the commode.  She struck her face and nose. CT scan of the head showed no acute issues.  She did have atrophy and chronic ischemic changes Syncope was thought to be due to orthostatic hypotension.  Also because she took Ambien.  Her troponins were negative echo was  negative TSH was normal She also had a right ankle pain.  X-ray of the foot showed possible subtle nondisplaced fracture of metatarsals base of third and fourth.  Now has ankle brace and continues to be WBAT Due to orthostatic hypotension her amlodipine and Nadolol was stopped.  Her lisinopril was changed to night.  Plan was for passive hypertension  Patient's daughter had number of questions including she is worried that her mom will continue to have orthostatic Syncope Patient did not have any acute issues today.  Does not feel dizzy.  Does feel weak and anxious about walking.  Also has some pain in right ankle.  Lived in [place] before that and walk with her walker.  Was driving and was independent in her IADLs  Past Medical History:  Diagnosis Date  . Anemia associated with acute blood loss 04/03/2011  . Arthritis    knees   . Ascites   . Blood transfusion    hx of 2011   . Complication of anesthesia    Sodium drops per pt   . Cystitis   . DIARRHEA, ANTIBIOTIC ASSOCIATED 05/31/2009   Qualifier: Diagnosis of  By: Tommy Medal MD, Roderic Scarce    . GERD (gastroesophageal reflux disease)   . H/O hiatal hernia   . Hyperlipidemia   . Hypertension   . Hyponatremia   . Neutropenia with fever ([place]) 05/18/2011  . OSTEOMYELITIS, CHRONIC, LOWER LEG 04/23/2009   Qualifier: Diagnosis of  By: Johnnye Sima MD, Dellis Filbert    . Osteopenia   . OSTEOPOROSIS 04/23/2009   Qualifier: Diagnosis of  By: Johnnye Sima MD, Dellis Filbert    . Ovarian cancer ([place]) 01/25/2011  . Pneumonia    hx of   . Postmenopausal atrophic vaginitis   . PROSTHETIC JOINT COMPLICATION 3/81/8299   Qualifier: Diagnosis of  By: Tommy Medal MD, Roderic Scarce    . Renal disorder    Decreased kidney function  . Staph infection 2010   after knee replacement  . Urinary frequency   . Vulvitis    Past Surgical History:  Procedure Laterality Date  . ABDOMINAL HYSTERECTOMY  04/01/2011   Procedure: HYSTERECTOMY ABDOMINAL;  Surgeon: Alvino Chapel, MD;   Location: [facility];  Service: Gynecology;  Laterality: N/A;  . APPENDECTOMY    . JOINT REPLACEMENT     R knee in 2008, 5 operations on L knee  . LAPAROTOMY  04/01/2011   Procedure: EXPLORATORY LAPAROTOMY;  Surgeon: Alvino Chapel, MD;  Location: [facility];  Service: Gynecology;  Laterality: N/A;  . OTHER SURGICAL HISTORY     hx of C section 1966  . SALPINGOOPHORECTOMY  04/01/2011   Procedure: SALPINGO OOPHERECTOMY;  Surgeon: Alvino Chapel, MD;  Location: [facility];  Service: Gynecology;  Laterality: Bilateral;  . TONSILLECTOMY      reports that she quit smoking about 68 years ago. She has never used smokeless tobacco. She reports previous alcohol use. She reports that she does not use drugs. Social History   Socioeconomic History  . Marital status: Widowed    Spouse name: Not on file  . Number of children: Not on file  . Years of education: Not on file  . Highest education level: Not on file  Occupational History  . Not on file  Tobacco Use  . Smoking status: Former Smoker    Quit date: 02/11/1952    Years since quitting: 68.2  . Smokeless tobacco: Never Used  Vaping Use  . Vaping Use: Never used  Substance and Sexual Activity  . Alcohol use: Not Currently  . Drug use: Never  . Sexual activity: Never  Other Topics Concern  . Not on file  Social History Narrative  . Not on file   Social Determinants of Health   Financial Resource Strain: Not on file  Food Insecurity: Not on file  Transportation Needs: Not on file  Physical Activity: Not on file  Stress: Not on file  Social Connections: Not on file  Intimate Partner Violence: Not on file    Functional Status Survey:    Family History  Problem Relation Age of Onset  . Cancer Other        Bladder cancer  . Heart attack Brother   . Diabetes Brother     Health Maintenance  Topic Date Due  . COVID-19 Vaccine (4 - Booster for Moderna series) 06/24/2020  . TETANUS/TDAP  10/15/2020  . INFLUENZA VACCINE   Completed  . DEXA SCAN  Completed  . PNA vac Low Risk Adult  Completed  . HPV VACCINES  Aged Out    Allergies  Allergen Reactions  . Morphine And Related Other (See Comments)    Given after knee replacement and code blue occurred  . Penicillins Hives and Itching    syncope  . Nsaids Other (See Comments)    abn kidney test  . Shellfish Allergy Nausea And Vomiting and Other (See Comments)    Pt has shellfish allergy only.  Has had IV contrast x 2 and did fine.    Outpatient Encounter Medications as of 04/12/2020  Medication Sig  . acetaminophen (  TYLENOL) 500 MG tablet Take 2 tablets (1,000 mg total) by mouth 3 (three) times daily.  Marland Kitchen azelastine (ASTELIN) 0.1 % nasal spray Place 1 spray into both nostrils at bedtime as needed for rhinitis or allergies.  . B Complex Vitamins (VITAMIN B COMPLEX PO) Take 1 tablet by mouth daily.   . calcium carbonate (TUMS EX) 750 MG chewable tablet Chew 750 mg by mouth daily as needed for heartburn.  . Cholecalciferol (VITAMIN D) 2000 UNITS tablet Take 2,000 Units by mouth daily.  . diclofenac Sodium (VOLTAREN) 1 % GEL Apply 2 g topically 4 (four) times daily.  Marland Kitchen docusate sodium (COLACE) 100 MG capsule Take 100 mg by mouth daily as needed for mild constipation.   Marland Kitchen lisinopril (ZESTRIL) 20 MG tablet Take 20 mg by mouth daily.  . meclizine (ANTIVERT) 12.5 MG tablet Take 1 tablet (12.5 mg total) by mouth 2 (two) times daily as needed for dizziness.  . melatonin 3 MG TABS tablet Take 1 tablet (3 mg total) by mouth at bedtime.  . Multiple Vitamins-Minerals (CENTRUM ADULTS PO) Take 1 tablet by mouth daily.  . RESTASIS 0.05 % ophthalmic emulsion Place 1 drop into both eyes in the morning and at bedtime.  Marland Kitchen zinc oxide 20 % ointment Apply 1 application topically as needed for irritation.   No facility-administered encounter medications on file as of 04/12/2020.    Review of Systems  Constitutional: Positive for activity change.  HENT: Negative.   Respiratory:  Negative.   Cardiovascular: Negative.   Gastrointestinal: Positive for constipation.  Genitourinary: Negative.   Musculoskeletal: Positive for gait problem.  Neurological: Positive for weakness.  Psychiatric/Behavioral: The patient is nervous/anxious.     Vitals:   04/12/20 1623  BP: (!) 145/75  Pulse: 94  Resp: 20  Temp: (!) 97.3 F (36.3 C)  SpO2: 94%  Weight: 167 lb 8 oz (76 kg)  Height: 5\' 3"  (1.6 m)   Body mass index is 29.67 kg/m. Physical Exam  Constitutional: Oriented to person, place, and time. Well-developed and well-nourished.  HENT:  Head: Normocephalic.Bruises on her face  Mouth/Throat: Oropharynx is clear and moist.  Eyes: Pupils are equal, round, and reactive to light.  Neck: Neck supple.  Cardiovascular: Normal rate and normal heart sounds.  No murmur heard. Pulmonary/Chest: Effort normal and breath sounds normal. No respiratory distress. No wheezes. She has no rales.  Abdominal: Soft. Bowel sounds are normal. No distension. There is no tenderness. There is no rebound.  Musculoskeletal: Brace in Right Foot Left has no edema Lymphadenopathy: none Neurological: Alert and oriented to person, place, and time. No Focal Deficits Skin: Skin is warm and dry.  Psychiatric: Normal mood and affect. Behavior is normal. Thought content normal.   Labs reviewed: Basic Metabolic Panel: Recent Labs    10/08/19 0627 10/09/19 0708 03/02/20 1053 04/06/20 0207 04/07/20 0220 04/08/20 0322 04/09/20 0159  NA 129* 131* 133*   < > 130* 129* 130*  K 3.6 4.0 4.8   < > 4.0 4.1 4.3  CL 98 101 102   < > 102 97* 99  CO2 24 24 21*   < > 20* 22 23  GLUCOSE 98 112* 148*   < > 118* 94 105*  BUN 20 14 26*   < > 12 12 18   CREATININE 1.06* 0.87 1.17*   < > 0.75 0.87 0.85  CALCIUM 8.1* 7.8* 9.6   < > 8.5* 8.6* 8.6*  MG 1.4* 1.7 1.5*  --   --  1.3*  --  PHOS 2.0* 2.6  --   --   --   --   --    < > = values in this interval not displayed.   Liver Function Tests: Recent Labs     08/04/19 1230 03/02/20 1053  AST 36 37  ALT 23 26  ALKPHOS 38 86  BILITOT 0.9 0.5  PROT 7.7 8.1  ALBUMIN 3.7 3.3*   Recent Labs    03/02/20 1053  LIPASE 40   No results for input(s): AMMONIA in the last 8760 hours. CBC: Recent Labs    08/04/19 1230 10/06/19 2144 10/08/19 0627 03/02/20 1053 04/06/20 0207 04/07/20 0220 04/09/20 0159  WBC 7.6   < > 6.7   < > 11.7* 8.8 7.3  NEUTROABS 5.4  --  4.2  --  9.2*  --   --   HGB 13.2   < > 11.4*   < > 14.1 10.9* 10.8*  HCT 40.5   < > 33.7*   < > 42.6 30.9* 33.1*  MCV 90.6   < > 89.6   < > 86.6 82.4 85.3  PLT 191   < > 292   < > 281 225 257   < > = values in this interval not displayed.   Cardiac Enzymes: No results for input(s): CKTOTAL, CKMB, CKMBINDEX, TROPONINI in the last 8760 hours. BNP: Invalid input(s): POCBNP No results found for: HGBA1C Lab Results  Component Value Date   TSH 2.357 04/06/2020   No results found for: VITAMINB12 No results found for: FOLATE Lab Results  Component Value Date   IRON 74 05/27/2011   TIBC 299 05/27/2011   FERRITIN 498 (H) 05/27/2011    Imaging and Procedures obtained prior to SNF admission: DG Ankle Complete Right  Result Date: 04/06/2020 CLINICAL DATA:  Syncopal episode. EXAM: RIGHT ANKLE - COMPLETE 3+ VIEW COMPARISON:  June 09, 2017 FINDINGS: There is no evidence of an acute fracture, dislocation, or joint effusion. A chronic deformity is seen involving the distal shaft of the right fibula. There is a moderate-sized plantar calcaneal spur. Soft tissues are unremarkable. IMPRESSION: Chronic deformity of the distal right fibula. Electronically Signed   By: Virgina Norfolk M.D.   On: 04/06/2020 02:45   CT HEAD WO CONTRAST  Result Date: 04/06/2020 CLINICAL DATA:  Syncopal episode. EXAM: CT HEAD WITHOUT CONTRAST TECHNIQUE: Contiguous axial images were obtained from the base of the skull through the vertex without intravenous contrast. COMPARISON:  March 02, 2020 FINDINGS: Brain:  There is mild cerebral atrophy with widening of the extra-axial spaces and ventricular dilatation. There are areas of decreased attenuation within the white matter tracts of the supratentorial brain, consistent with microvascular disease changes. A stable 1 cm para falcine calcified right occipital lobe meningioma is noted. Vascular: No hyperdense vessels are identified. Skull: Nondisplaced bilateral nasal bone fractures of indeterminate age are seen. Sinuses/Orbits: Marked severity sphenoid sinus mucosal thickening is present. Other: Mild to moderate severity frontal scalp soft tissue swelling is noted along the midline. IMPRESSION: 1. Mild to moderate severity frontal scalp soft tissue swelling without evidence of an acute fracture or acute intracranial abnormality. 2. Stable 1 cm para falcine calcified right occipital lobe meningioma. 3. Bilateral nondisplaced nasal bone fractures of indeterminate age. Electronically Signed   By: Virgina Norfolk M.D.   On: 04/06/2020 02:42   ECHOCARDIOGRAM COMPLETE  Result Date: 04/06/2020    ECHOCARDIOGRAM REPORT   Patient Name:   PROMYSE ARDITO [facility] Date of Exam: 04/06/2020 Medical Rec #:  [id number]        Height:       63.0 in Accession #:    [id number]       Weight:       155.0 lb Date of Birth:  [birth year]        BSA:          1.735 m Patient Age:    34 years         BP:           140/64 mmHg Patient Gender: F                HR:           84 bpm. Exam Location:  Inpatient Procedure: 2D Echo, Cardiac Doppler and Color Doppler Indications:    R55 Syncope  History:        Patient has prior history of Echocardiogram examinations, most                 recent 01/04/2019. Signs/Symptoms:Murmur, Shortness of Breath                 and Dyspnea; Risk Factors:Hypertension, Dyslipidemia and Former                 Smoker.  Sonographer:    Roseanna Rainbow RDCS Referring Phys: [id number] JENNIFER YATES  Sonographer Comments: Technically difficult study due to poor echo windows. Patient had recent  fall, could not turn. Patient sensitive to pressure from probe in apical region. IMPRESSIONS  1. Left ventricular ejection fraction, by estimation, is 70 to 75%. The left ventricle has hyperdynamic function. The left ventricle has no regional wall motion abnormalities. There is mild left ventricular hypertrophy. Left ventricular diastolic parameters are consistent with Grade I diastolic dysfunction (impaired relaxation).  2. Right ventricular systolic function is normal. The right ventricular size is normal.  3. The mitral valve is normal in structure. No evidence of mitral valve regurgitation. No evidence of mitral stenosis.  4. The aortic valve is tricuspid. Aortic valve regurgitation is mild. Mild aortic valve stenosis. Aortic valve area, by VTI measures 1.91 cm. Aortic valve mean gradient measures 10.0 mmHg. Aortic valve Vmax measures 2.12 m/s.  5. The inferior vena cava is normal in size with greater than 50% respiratory variability, suggesting right atrial pressure of 3 mmHg. FINDINGS  Left Ventricle: Left ventricular ejection fraction, by estimation, is 70 to 75%. The left ventricle has hyperdynamic function. The left ventricle has no regional wall motion abnormalities. The left ventricular internal cavity size was normal in size. There is mild left ventricular hypertrophy. Left ventricular diastolic parameters are consistent with Grade I diastolic dysfunction (impaired relaxation). Right Ventricle: The right ventricular size is normal. No increase in right ventricular wall thickness. Right ventricular systolic function is normal. Left Atrium: Left atrial size was normal in size. Right Atrium: Right atrial size was normal in size. Pericardium: There is no evidence of pericardial effusion. Mitral Valve: The mitral valve is normal in structure. Mild mitral annular calcification. No evidence of mitral valve regurgitation. No evidence of mitral valve stenosis. Tricuspid Valve: The tricuspid valve is normal in  structure. Tricuspid valve regurgitation is not demonstrated. No evidence of tricuspid stenosis. Aortic Valve: The aortic valve is tricuspid. Aortic valve regurgitation is mild. Aortic regurgitation PHT measures 393 msec. Mild aortic stenosis is present. Aortic valve mean gradient measures 10.0 mmHg. Aortic valve peak gradient measures 18.0 mmHg. Aortic valve area, by VTI measures 1.91 cm. Pulmonic Valve: The pulmonic  valve was normal in structure. Pulmonic valve regurgitation is not visualized. No evidence of pulmonic stenosis. Aorta: The aortic root is normal in size and structure. Venous: The inferior vena cava is normal in size with greater than 50% respiratory variability, suggesting right atrial pressure of 3 mmHg. IAS/Shunts: No atrial level shunt detected by color flow Doppler.  LEFT VENTRICLE PLAX 2D LVIDd:         3.40 cm     Diastology LVIDs:         2.00 cm     LV e' medial:    6.96 cm/s LV PW:         1.10 cm     LV E/e' medial:  14.2 LV IVS:        1.30 cm     LV e' lateral:   4.90 cm/s LVOT diam:     1.90 cm     LV E/e' lateral: 20.1 LV SV:         93 LV SV Index:   54 LVOT Area:     2.84 cm  LV Volumes (MOD) LV vol d, MOD A2C: 44.9 ml LV vol d, MOD A4C: 56.2 ml LV vol s, MOD A2C: 9.0 ml LV vol s, MOD A4C: 13.8 ml LV SV MOD A2C:     35.9 ml LV SV MOD A4C:     56.2 ml LV SV MOD BP:      39.8 ml RIGHT VENTRICLE             IVC RV S prime:     20.00 cm/s  IVC diam: 1.90 cm TAPSE (M-mode): 3.2 cm LEFT ATRIUM             Index LA diam:        4.80 cm 2.77 cm/m LA Vol (A2C):   42.8 ml 24.67 ml/m LA Vol (A4C):   36.8 ml 21.21 ml/m LA Biplane Vol: 41.4 ml 23.86 ml/m  AORTIC VALVE AV Area (Vmax):    2.55 cm AV Area (Vmean):   2.25 cm AV Area (VTI):     1.91 cm AV Vmax:           212.00 cm/s AV Vmean:          150.000 cm/s AV VTI:            0.486 m AV Peak Grad:      18.0 mmHg AV Mean Grad:      10.0 mmHg LVOT Vmax:         191.00 cm/s LVOT Vmean:        119.000 cm/s LVOT VTI:          0.328 m LVOT/AV  VTI ratio: 0.67 AI PHT:            393 msec  AORTA Ao Root diam: 3.10 cm Ao Asc diam:  3.60 cm MITRAL VALVE MV Area (PHT): 3.72 cm     SHUNTS MV Decel Time: 204 msec     Systemic VTI:  0.33 m MV E velocity: 98.50 cm/s   Systemic Diam: 1.90 cm MV A velocity: 104.00 cm/s MV E/A ratio:  0.95 Candee Furbish MD Electronically signed by Candee Furbish MD Signature Date/Time: 04/06/2020/3:25:34 PM    Final    CT Maxillofacial Wo Contrast  Result Date: 04/06/2020 CLINICAL DATA:  Syncopal episode. EXAM: CT MAXILLOFACIAL WITHOUT CONTRAST TECHNIQUE: Multidetector CT imaging of the maxillofacial structures was performed. Multiplanar CT image reconstructions were also generated. COMPARISON:  None. FINDINGS: Osseous:  Bilateral nondisplaced nasal bone fractures of indeterminate age are seen. Orbits: Negative. No traumatic or inflammatory finding. Sinuses: There is marked severity sphenoid sinus mucosal thickening. Soft tissues: Mild to moderate severity frontal scalp soft tissue swelling is seen, along the midline. Limited intracranial: No significant or unexpected finding. IMPRESSION: 1. Bilateral nondisplaced nasal bone fractures. 2. Mild to moderate severity frontal scalp soft tissue swelling. 3. Marked severity sphenoid sinus mucosal thickening. Electronically Signed   By: Virgina Norfolk M.D.   On: 04/06/2020 02:43    Assessment/Plan  Syncope, unspecified syncope type ? Due to Orthostatic Hypotension Discussed in detail with the daughter and patient. She is very anxious that this episode will happen again. We will continue to work with therapy Check orthostatic blood pressure daily the next few days. Is off to her blood pressure medication. Also ordered TED hoses Will start on midodrine if patient is continues to have orthostasis Essential hypertension We will plan for passive hypertension On low-dose of lisinopril at night Primary insomnia Is off her Ambien and will use melatonin as needed Primary  osteoarthritis involving multiple joints Tylenol Gastroesophageal reflux disease, unspecified whether esophagitis present Continue Prilosec  Acute right ankle pain Been repeat x-ray of ankle and foot in 1 week  Discussed in detail with daughter who is considering transitioning patient to [place].  They also do not want her to drive anymore.     Family/ staff Communication:   Labs/tests ordered: CBC,CMP in 1 week

## 2020-04-16 ENCOUNTER — Emergency Department (HOSPITAL_COMMUNITY): Payer: Medicare Other

## 2020-04-16 ENCOUNTER — Encounter (HOSPITAL_COMMUNITY): Payer: Self-pay | Admitting: Emergency Medicine

## 2020-04-16 ENCOUNTER — Inpatient Hospital Stay (HOSPITAL_COMMUNITY)
Admission: EM | Admit: 2020-04-16 | Discharge: 2020-04-25 | DRG: 312 | Disposition: A | Discharge status: Skilled Nursing Facility | Payer: Medicare Other | Source: Skilled Nursing Facility | Attending: Family Medicine | Admitting: Family Medicine

## 2020-04-16 DIAGNOSIS — S92334D Nondisplaced fracture of third metatarsal bone, right foot, subsequent encounter for fracture with routine healing: Secondary | ICD-10-CM

## 2020-04-16 DIAGNOSIS — K219 Gastro-esophageal reflux disease without esophagitis: Secondary | ICD-10-CM | POA: Diagnosis present

## 2020-04-16 DIAGNOSIS — E46 Unspecified protein-calorie malnutrition: Secondary | ICD-10-CM | POA: Diagnosis present

## 2020-04-16 DIAGNOSIS — F064 Anxiety disorder due to known physiological condition: Secondary | ICD-10-CM | POA: Diagnosis present

## 2020-04-16 DIAGNOSIS — Z20822 Contact with and (suspected) exposure to covid-19: Secondary | ICD-10-CM | POA: Diagnosis not present

## 2020-04-16 DIAGNOSIS — I951 Orthostatic hypotension: Secondary | ICD-10-CM | POA: Diagnosis not present

## 2020-04-16 DIAGNOSIS — W19XXXD Unspecified fall, subsequent encounter: Secondary | ICD-10-CM | POA: Diagnosis present

## 2020-04-16 DIAGNOSIS — E222 Syndrome of inappropriate secretion of antidiuretic hormone: Secondary | ICD-10-CM | POA: Diagnosis present

## 2020-04-16 DIAGNOSIS — R0789 Other chest pain: Secondary | ICD-10-CM | POA: Diagnosis not present

## 2020-04-16 DIAGNOSIS — Z880 Allergy status to penicillin: Secondary | ICD-10-CM

## 2020-04-16 DIAGNOSIS — S92344D Nondisplaced fracture of fourth metatarsal bone, right foot, subsequent encounter for fracture with routine healing: Secondary | ICD-10-CM

## 2020-04-16 DIAGNOSIS — Z6829 Body mass index (BMI) 29.0-29.9, adult: Secondary | ICD-10-CM

## 2020-04-16 DIAGNOSIS — Z96651 Presence of right artificial knee joint: Secondary | ICD-10-CM | POA: Diagnosis present

## 2020-04-16 DIAGNOSIS — N179 Acute kidney failure, unspecified: Secondary | ICD-10-CM | POA: Diagnosis not present

## 2020-04-16 DIAGNOSIS — R55 Syncope and collapse: Secondary | ICD-10-CM | POA: Diagnosis present

## 2020-04-16 DIAGNOSIS — I491 Atrial premature depolarization: Secondary | ICD-10-CM | POA: Diagnosis not present

## 2020-04-16 DIAGNOSIS — R935 Abnormal findings on diagnostic imaging of other abdominal regions, including retroperitoneum: Secondary | ICD-10-CM

## 2020-04-16 DIAGNOSIS — K223 Perforation of esophagus: Secondary | ICD-10-CM

## 2020-04-16 DIAGNOSIS — Z8543 Personal history of malignant neoplasm of ovary: Secondary | ICD-10-CM

## 2020-04-16 DIAGNOSIS — R52 Pain, unspecified: Secondary | ICD-10-CM

## 2020-04-16 DIAGNOSIS — I2510 Atherosclerotic heart disease of native coronary artery without angina pectoris: Secondary | ICD-10-CM | POA: Diagnosis present

## 2020-04-16 DIAGNOSIS — E861 Hypovolemia: Secondary | ICD-10-CM | POA: Diagnosis present

## 2020-04-16 DIAGNOSIS — R079 Chest pain, unspecified: Secondary | ICD-10-CM | POA: Diagnosis not present

## 2020-04-16 DIAGNOSIS — I350 Nonrheumatic aortic (valve) stenosis: Secondary | ICD-10-CM | POA: Diagnosis present

## 2020-04-16 DIAGNOSIS — I700 Atherosclerosis of aorta: Secondary | ICD-10-CM | POA: Diagnosis present

## 2020-04-16 DIAGNOSIS — I959 Hypotension, unspecified: Secondary | ICD-10-CM

## 2020-04-16 DIAGNOSIS — K429 Umbilical hernia without obstruction or gangrene: Secondary | ICD-10-CM | POA: Diagnosis present

## 2020-04-16 DIAGNOSIS — Z833 Family history of diabetes mellitus: Secondary | ICD-10-CM

## 2020-04-16 DIAGNOSIS — Z91013 Allergy to seafood: Secondary | ICD-10-CM

## 2020-04-16 DIAGNOSIS — K449 Diaphragmatic hernia without obstruction or gangrene: Secondary | ICD-10-CM | POA: Diagnosis present

## 2020-04-16 DIAGNOSIS — Z8052 Family history of malignant neoplasm of bladder: Secondary | ICD-10-CM

## 2020-04-16 DIAGNOSIS — Z8249 Family history of ischemic heart disease and other diseases of the circulatory system: Secondary | ICD-10-CM

## 2020-04-16 DIAGNOSIS — I10 Essential (primary) hypertension: Secondary | ICD-10-CM | POA: Diagnosis not present

## 2020-04-16 DIAGNOSIS — E871 Hypo-osmolality and hyponatremia: Secondary | ICD-10-CM | POA: Diagnosis present

## 2020-04-16 DIAGNOSIS — J90 Pleural effusion, not elsewhere classified: Secondary | ICD-10-CM | POA: Diagnosis not present

## 2020-04-16 DIAGNOSIS — I499 Cardiac arrhythmia, unspecified: Secondary | ICD-10-CM | POA: Diagnosis not present

## 2020-04-16 LAB — URINALYSIS, ROUTINE W REFLEX MICROSCOPIC
Bacteria, UA: NONE SEEN
Bilirubin Urine: NEGATIVE
Glucose, UA: NEGATIVE mg/dL
Hgb urine dipstick: NEGATIVE
Ketones, ur: NEGATIVE mg/dL
Nitrite: NEGATIVE
Protein, ur: NEGATIVE mg/dL
Specific Gravity, Urine: 1.023 (ref 1.005–1.030)
pH: 7 (ref 5.0–8.0)

## 2020-04-16 LAB — CBC
HCT: 40.6 % (ref 36.0–46.0)
Hemoglobin: 13.1 g/dL (ref 12.0–15.0)
MCH: 27.8 pg (ref 26.0–34.0)
MCHC: 32.3 g/dL (ref 30.0–36.0)
MCV: 86.2 fL (ref 80.0–100.0)
Platelets: 395 10*3/uL (ref 150–400)
RBC: 4.71 MIL/uL (ref 3.87–5.11)
RDW: 15.4 % (ref 11.5–15.5)
WBC: 9.5 10*3/uL (ref 4.0–10.5)
nRBC: 0 % (ref 0.0–0.2)

## 2020-04-16 LAB — BASIC METABOLIC PANEL
Anion gap: 11 (ref 5–15)
BUN: 26 mg/dL — ABNORMAL HIGH (ref 8–23)
CO2: 23 mmol/L (ref 22–32)
Calcium: 9.8 mg/dL (ref 8.9–10.3)
Chloride: 92 mmol/L — ABNORMAL LOW (ref 98–111)
Creatinine, Ser: 1.12 mg/dL — ABNORMAL HIGH (ref 0.44–1.00)
GFR, Estimated: 47 mL/min — ABNORMAL LOW (ref >60)
Glucose, Bld: 135 mg/dL — ABNORMAL HIGH (ref 70–99)
Potassium: 4.4 mmol/L (ref 3.5–5.1)
Sodium: 126 mmol/L — ABNORMAL LOW (ref 135–145)

## 2020-04-16 LAB — TROPONIN I (HIGH SENSITIVITY)
Troponin I (High Sensitivity): 20 ng/L — ABNORMAL HIGH (ref <18)
Troponin I (High Sensitivity): 18 ng/L — ABNORMAL HIGH (ref <18)

## 2020-04-16 LAB — LACTIC ACID, PLASMA: Lactic Acid, Venous: 1.4 mmol/L (ref 0.5–1.9)

## 2020-04-16 MED ORDER — SODIUM CHLORIDE 0.9% FLUSH
3.0000 mL | Freq: Two times a day (BID) | INTRAVENOUS | Status: DC
  Administered 2020-04-17 – 2020-04-24 (×16): 3 mL via INTRAVENOUS

## 2020-04-16 MED ORDER — LACTATED RINGERS IV BOLUS
1000.0000 mL | Freq: Once | INTRAVENOUS | Status: AC
  Administered 2020-04-16: 1000 mL via INTRAVENOUS

## 2020-04-16 MED ORDER — ACETAMINOPHEN 650 MG RE SUPP: 650.0000 mg | Freq: Four times a day (QID) | RECTAL | Status: DC | PRN

## 2020-04-16 MED ORDER — PANTOPRAZOLE SODIUM 40 MG PO TBEC
40.0000 mg | DELAYED_RELEASE_TABLET | Freq: Two times a day (BID) | ORAL | Status: DC
  Administered 2020-04-17 (×2): 40 mg via ORAL
  Filled 2020-04-16 (×2): qty 1

## 2020-04-16 MED ORDER — MELATONIN 3 MG PO TABS
3.0000 mg | ORAL_TABLET | Freq: Every day | ORAL | Status: DC
  Administered 2020-04-17 – 2020-04-24 (×8): 3 mg via ORAL
  Filled 2020-04-16 (×8): qty 1

## 2020-04-16 MED ORDER — IOHEXOL 350 MG/ML SOLN
60.0000 mL | Freq: Once | INTRAVENOUS | Status: AC | PRN
  Administered 2020-04-16: 60 mL via INTRAVENOUS

## 2020-04-16 MED ORDER — SODIUM CHLORIDE 0.9 % IV SOLN: INTRAVENOUS | Status: AC

## 2020-04-16 MED ORDER — ENOXAPARIN SODIUM 30 MG/0.3ML ~~LOC~~ SOLN
30.0000 mg | SUBCUTANEOUS | Status: DC
  Administered 2020-04-17: 30 mg via SUBCUTANEOUS
  Filled 2020-04-16: qty 0.3

## 2020-04-16 MED ORDER — ACETAMINOPHEN 325 MG PO TABS: 650.0000 mg | ORAL_TABLET | Freq: Four times a day (QID) | ORAL | Status: DC | PRN

## 2020-04-16 NOTE — H&P (Signed)
History and Physical    [name] IDP:[id number] DOB: 1929/12/07 DOA: 04/16/2020  PCP: Hulan Fess, MD  Patient coming from: SNF  I have personally briefly reviewed patient's old medical records in [place]  Chief Complaint: Syncope, chest pain  HPI: [name] is a 87 y.o. female with medical history significant for hypertension, remote ovarian cancer, chronic hyponatremia who presents to the ED from SNF for evaluation of syncope and chest pain.  History is supplemented by daughter at bedside.  Patient was recently admitted from 04/06/2020-04/11/2020 for syncope with fall, suspected orthostatic versus micturition syncope in etiology +/- Ambien effect.  TTE 04/06/2020 showed EF 70-75%, G1 DD, mild AR and AS.  Her Ambien, Norvasc, and nadolol were discontinued, lisinopril was continued on discharge.  TED hose and advised to increase oral intake or recommended.  She was discharged to SNF.  Daughter states that at her SNF today nursing reported that around 2 PM they were assisting her to ambulate to the bathroom when she became lightheaded/dizzy.  They sat her on the commode and she developed a transient unresponsive episode.  She did not void prior to this unresponsive period.  Her blood pressure was reportedly 150s/99.  Patient does state that she becomes easily lightheaded/dizzy when changing positions and soon after beginning to walk.  Around 3 PM patient developed a midsternal pressure-like chest discomfort without radiation.  She did not have any associated diaphoresis, nausea, vomiting, or dyspnea.  Symptoms have since resolved.  She reports chronic heartburn/acid reflux related to a hiatal hernia which is notably different than the symptoms she was experiencing.  Due to her chest discomfort she was sent to the ED for further evaluation.  ED Course:  On arrival patient was hypotensive with BP 66/52, map 59, pulse 120, RR 16, temp 98.2 F, SPO2 98% on room air.  Patient was  given 1 L LR with improvement in blood pressure.  Labs significant for creatinine 1.12 (0.85 on 04/09/2020), BUN 26, sodium 126 (131 04/01/2020), potassium 4.4, bicarb 23, serum glucose 135, WBC 9.5, hemoglobin 13.1, platelets 395,000, troponin 20.  Blood cultures and SARS-CoV-2 PCR panel were collected and pending.  Portable chest x-ray negative for focal consolidation, edema, or effusion.  CTA chest PE study was negative for evidence of PE.  Small right pleural effusion and bibasilar atelectatic changes noted.  Hiatal hernia with distal esophageal thickening also seen.  The hospitalist service was consulted to admit for further evaluation and management.  Review of Systems:  All systems reviewed and are negative except as documented in history of present illness above.   Past Medical History:  Diagnosis Date   Anemia associated with acute blood loss 04/03/2011   Arthritis    knees    Ascites    Blood transfusion    hx of 4431    Complication of anesthesia    Sodium drops per pt    Cystitis    DIARRHEA, ANTIBIOTIC ASSOCIATED 05/31/2009   Qualifier: Diagnosis of  By: Tommy Medal MD, Cornelius     GERD (gastroesophageal reflux disease)    H/O hiatal hernia    Hyperlipidemia    Hypertension    Hyponatremia    Neutropenia with fever ([place]) 05/18/2011   OSTEOMYELITIS, CHRONIC, LOWER LEG 04/23/2009   Qualifier: Diagnosis of  By: Johnnye Sima MD, Felecia Shelling    OSTEOPOROSIS 04/23/2009   Qualifier: Diagnosis of  By: Johnnye Sima MD, Jeffrey     Ovarian cancer ([place]) 01/25/2011  Pneumonia    hx of    Postmenopausal atrophic vaginitis    PROSTHETIC JOINT COMPLICATION 0/30/0923   Qualifier: Diagnosis of  By: Tommy Medal MD, Cornelius     Renal disorder    Decreased kidney function   Staph infection 2010   after knee replacement   Urinary frequency    Vulvitis     Past Surgical History:  Procedure Laterality Date   ABDOMINAL HYSTERECTOMY  04/01/2011   Procedure:  HYSTERECTOMY ABDOMINAL;  Surgeon: Alvino Chapel, MD;  Location: [facility];  Service: Gynecology;  Laterality: N/A;   APPENDECTOMY     JOINT REPLACEMENT     R knee in 2008, 5 operations on L knee   LAPAROTOMY  04/01/2011   Procedure: EXPLORATORY LAPAROTOMY;  Surgeon: Alvino Chapel, MD;  Location: [facility];  Service: Gynecology;  Laterality: N/A;   OTHER SURGICAL HISTORY     hx of C section 1966   SALPINGOOPHORECTOMY  04/01/2011   Procedure: SALPINGO OOPHERECTOMY;  Surgeon: Alvino Chapel, MD;  Location: [facility];  Service: Gynecology;  Laterality: Bilateral;   TONSILLECTOMY      Social History:  reports that she quit smoking about 68 years ago. She has never used smokeless tobacco. She reports previous alcohol use. She reports that she does not use drugs.  Allergies  Allergen Reactions   Morphine And Related Other (See Comments)    Given after knee replacement and code blue occurred   Penicillins Hives and Itching    syncope   Nsaids Other (See Comments)    abn kidney test   Shellfish Allergy Nausea And Vomiting and Other (See Comments)    Pt has shellfish allergy only.  Has had IV contrast x 2 and did fine.    Family History  Problem Relation Age of Onset   Cancer Other        Bladder cancer   Heart attack Brother    Diabetes Brother      Prior to Admission medications   Medication Sig Start Date End Date Taking? Authorizing Provider  acetaminophen (TYLENOL) 500 MG tablet Take 2 tablets (1,000 mg total) by mouth 3 (three) times daily. 04/11/20   Geradine Girt, DO  azelastine (ASTELIN) 0.1 % nasal spray Place 1 spray into both nostrils at bedtime as needed for rhinitis or allergies. 12/06/15   [provider]  B Complex Vitamins (VITAMIN B COMPLEX PO) Take 1 tablet by mouth daily.     [provider]  calcium carbonate (TUMS EX) 750 MG chewable tablet Chew 750 mg by mouth daily as needed for heartburn.    [provider]  Cholecalciferol (VITAMIN D) 2000 UNITS tablet Take 2,000 Units by mouth daily.    [provider]  diclofenac Sodium (VOLTAREN) 1 % GEL Apply 2 g topically 4 (four) times daily. 04/11/20   Geradine Girt, DO  docusate sodium (COLACE) 100 MG capsule Take 100 mg by mouth daily as needed for mild constipation.     [provider]  lisinopril (ZESTRIL) 20 MG tablet Take 20 mg by mouth daily. 10/18/18   [provider]  meclizine (ANTIVERT) 12.5 MG tablet Take 1 tablet (12.5 mg total) by mouth 2 (two) times daily as needed for dizziness. 04/11/20   Eulogio Bear U, DO  melatonin 3 MG TABS tablet Take 1 tablet (3 mg total) by mouth at bedtime. 04/11/20   Geradine Girt, DO  Multiple Vitamins-Minerals (CENTRUM ADULTS PO) Take 1 tablet by  mouth daily.    [provider]  RESTASIS 0.05 % ophthalmic emulsion Place 1 drop into both eyes in the morning and at bedtime. 09/02/19   [provider]  zinc oxide 20 % ointment Apply 1 application topically as needed for irritation.    [provider]    Physical Exam: Vitals:   04/16/20 1845 04/16/20 1903 04/16/20 2023 04/16/20 2100  BP: (!) 128/57 (!) 104/56 (!) 121/56 (!) 154/73  Pulse: 84 85 87 85  Resp: 18 15 14  (!) 21  Temp:  (!) 97.4 F (36.3 C)    TempSrc:  Oral    SpO2: 98% 97% 98% 96%   Constitutional: Resting supine in bed, NAD, calm, comfortable Eyes: PERRL, lids and conjunctivae normal ENMT: Hard of hearing, mucous membranes are moist. Posterior pharynx clear of any exudate or lesions. Neck: normal, supple, no masses. Respiratory: clear to auscultation bilaterally, no wheezing, no crackles. Normal respiratory effort. No accessory muscle use.  Cardiovascular: Regular rate and rhythm, systolic murmur left upper sternal border present. No extremity edema. 2+ pedal pulses. Abdomen: no tenderness, no masses palpated.  Bowel sounds positive.  Musculoskeletal: no clubbing / cyanosis. No  joint deformity upper and lower extremities. Good ROM, no contractures. Normal muscle tone.  Skin: Steri-Strips in place right forehead and nose from previous soft tissue injuries on last admission, appear to be healing well. No induration Neurologic: CN 2-12 grossly intact. Sensation intact. Strength 5/5 in all 4.  Psychiatric: Normal judgment and insight. Alert and oriented x 3. Normal mood.   Labs on Admission: I have personally reviewed following labs and imaging studies  CBC: Recent Labs  Lab 04/16/20 1731  WBC 9.5  HGB 13.1  HCT 40.6  MCV 86.2  PLT 947   Basic Metabolic Panel: Recent Labs  Lab 04/16/20 1731  NA 126*  K 4.4  CL 92*  CO2 23  GLUCOSE 135*  BUN 26*  CREATININE 1.12*  CALCIUM 9.8   GFR: Estimated Creatinine Clearance: 33.9 mL/min (A) (by C-G formula based on SCr of 1.12 mg/dL (H)). Liver Function Tests: No results for input(s): AST, ALT, ALKPHOS, BILITOT, PROT, ALBUMIN in the last 168 hours. No results for input(s): LIPASE, AMYLASE in the last 168 hours. No results for input(s): AMMONIA in the last 168 hours. Coagulation Profile: No results for input(s): INR, PROTIME in the last 168 hours. Cardiac Enzymes: No results for input(s): CKTOTAL, CKMB, CKMBINDEX, TROPONINI in the last 168 hours. BNP (last 3 results) No results for input(s): PROBNP in the last 8760 hours. HbA1C: No results for input(s): HGBA1C in the last 72 hours. CBG: No results for input(s): GLUCAP in the last 168 hours. Lipid Profile: No results for input(s): CHOL, HDL, LDLCALC, TRIG, CHOLHDL, LDLDIRECT in the last 72 hours. Thyroid Function Tests: No results for input(s): TSH, T4TOTAL, FREET4, T3FREE, THYROIDAB in the last 72 hours. Anemia Panel: No results for input(s): VITAMINB12, FOLATE, FERRITIN, TIBC, IRON, RETICCTPCT in the last 72 hours. Urine analysis:    Component Value Date/Time   COLORURINE YELLOW 04/06/2020 1025   APPEARANCEUR CLOUDY (A) 04/06/2020 1025   LABSPEC  1.012 04/06/2020 1025   LABSPEC 1.010 04/21/2011 1324   PHURINE 5.0 04/06/2020 1025   GLUCOSEU NEGATIVE 04/06/2020 1025   HGBUR NEGATIVE 04/06/2020 1025   BILIRUBINUR NEGATIVE 04/06/2020 1025   BILIRUBINUR Negative 04/21/2011 [place] 04/06/2020 1025   PROTEINUR NEGATIVE 04/06/2020 1025   UROBILINOGEN 0.2 05/18/2011 2014   NITRITE NEGATIVE 04/06/2020 1025  LEUKOCYTESUR MODERATE (A) 04/06/2020 1025   LEUKOCYTESUR Moderate 04/21/2011 1324    Radiological Exams on Admission: CT Angio Chest PE W and/or Wo Contrast  Result Date: 04/16/2020 CLINICAL DATA:  Chest pain, history of prior fall, initial encounter EXAM: CT ANGIOGRAPHY CHEST WITH CONTRAST TECHNIQUE: Multidetector CT imaging of the chest was performed using the standard protocol during bolus administration of intravenous contrast. Multiplanar CT image reconstructions and MIPs were obtained to evaluate the vascular anatomy. CONTRAST:  29mL OMNIPAQUE IOHEXOL 350 MG/ML SOLN COMPARISON:  Chest x-ray from earlier in the same day. FINDINGS: Cardiovascular: Thoracic aorta demonstrates atherosclerotic calcifications without aneurysmal dilatation. The degree of aortic enhancement is not sufficient to evaluate for dissection. No cardiac enlargement is seen. Diffuse coronary calcifications are noted. The pulmonary artery shows a normal branching pattern. No filling defect to suggest pulmonary embolism is noted. Mediastinum/Nodes: Thoracic inlet is within normal limits. No sizable hilar or mediastinal adenopathy is noted. Esophageal thickening is noted distally likely related to reflux. Hiatal hernia is seen. Lungs/Pleura: Mild bibasilar atelectatic changes are seen. Small right pleural effusion is noted. No sizable infiltrate is seen. No sizable parenchymal nodules are noted. Upper Abdomen: Visualized upper abdomen is otherwise within normal limits. Musculoskeletal: Degenerative changes of the thoracic spine are seen. No acute rib  abnormality is seen. Review of the MIP images confirms the above findings. IMPRESSION: No evidence of pulmonary emboli. Small right pleural effusion and bibasilar atelectatic changes. Hiatal hernia with distal esophageal thickening likely related to reflux. Aortic Atherosclerosis (ICD10-I70.0). Electronically Signed   By: Inez Catalina M.D.   On: 04/16/2020 19:55   DG Chest Portable 1 View  Result Date: 04/16/2020 CLINICAL DATA:  Recent fall, chest pain EXAM: PORTABLE CHEST 1 VIEW COMPARISON:  08/04/2019 FINDINGS: Single frontal view of the chest demonstrates an unremarkable cardiac silhouette. No airspace disease, effusion, or pneumothorax. No acute bony abnormalities. IMPRESSION: 1. No acute intrathoracic process. Electronically Signed   By: Randa Ngo M.D.   On: 04/16/2020 18:49    EKG: Personally reviewed. Normal sinus rhythm without acute ischemic changes.  Similar to prior.  Assessment/Plan Principal Problem:   Syncope Active Problems:   Hyponatremia   AKI (acute kidney injury) ([place])   [name] is a 87 y.o. female with medical history significant for hypertension, remote ovarian cancer, chronic hyponatremia who is admitted with syncope.  Recurrent syncopal episode with orthostatic symptoms: Again suspected orthostatic hypotension.  Significantly hypotensive on ED arrival which has improved with IV fluids.  Recent admit for the same with normal TSH, cortisol, and reassuring echocardiogram. -Continue IV fluid hydration overnight -Check orthostatic vital signs -Monitor on telemetry -Place TED hose -Hold home lisinopril -PT/OT eval  Chest pain: Patient with transient midsternal pressure-like chest discomfort without radiation.  She says this felt different than her reflux symptoms.  EKG is reassuring without acute ischemic changes.  Troponin is weakly positive at 20.  No prior known cardiac history.  Diffuse coronary calcifications and aortic atherosclerosis noted on CTA on  admission. -Monitor on telemetry -Trend cardiac enzymes -Consider further evaluation with stress testing  AKI: Creatinine 1.12 on admission up from 0.851-week ago.  Likely prerenal from hypovolemia. -Continue IV fluid hydration -Hold lisinopril  Acute on chronic hyponatremia: Sodium 126 on admission, baseline around 130.  Likely hypovolemic hyponatremia.  Continue IV NS @ 125 mL/hour overnight and repeat labs in AM.  Hypotension with history of hypertension: Hold lisinopril and continue IV fluids as above.  DVT prophylaxis: Lovenox Code Status: Full code,  confirmed with patient Family Communication: Discussed with patient's daughter at bedside Disposition Plan: From SNF, dispo pending further work-up and PT/OT eval Consults called: None Level of care: Telemetry Cardiac Admission status:  Status is: Observation  The patient remains OBS appropriate and will d/c before 2 midnights.  Dispo: The patient is from: SNF              Anticipated d/c is to: SNF              Patient currently is not medically stable to d/c.    Zada Finders MD [facility]  If 7PM-7AM, please contact night-coverage [email or web address]  04/16/2020, 9:48 PM

## 2020-04-16 NOTE — ED Triage Notes (Signed)
EMS stated, called about a hour ago with chest pain. Staff gave her 4 ASA.  Last pressure 126/56. HR high 80.  20g in left AC She had a recent fall a week ago and fell off the toilet. She is orthostatic. In past.

## 2020-04-16 NOTE — ED Provider Notes (Signed)
[place] EMERGENCY DEPARTMENT Provider Note   CSN: [id number] Arrival date & time: 04/16/20  1714     History Chief Complaint  Patient presents with  . Chest Pain    [name] is a 87 y.o. female.  HPI 87 year old female presents with chest pressure and syncope.  History is taken from patient and later daughter.  Patient was recently admitted and discharged and has been still having lightheadedness.  She had a syncopal episode while trying to go to the bathroom with staff today.  Later around 4 PM had some chest pressure.  She feels well now.  She was given 4 baby aspirin by staff there.  Denies any shortness of breath or other acute symptoms.   Past Medical History:  Diagnosis Date  . Anemia associated with acute blood loss 04/03/2011  . Arthritis    knees   . Ascites   . Blood transfusion    hx of 2011   . Complication of anesthesia    Sodium drops per pt   . Cystitis   . DIARRHEA, ANTIBIOTIC ASSOCIATED 05/31/2009   Qualifier: Diagnosis of  By: Tommy Medal MD, Roderic Scarce    . GERD (gastroesophageal reflux disease)   . H/O hiatal hernia   . Hyperlipidemia   . Hypertension   . Hyponatremia   . Neutropenia with fever ([place]) 05/18/2011  . OSTEOMYELITIS, CHRONIC, LOWER LEG 04/23/2009   Qualifier: Diagnosis of  By: Johnnye Sima MD, Dellis Filbert    . Osteopenia   . OSTEOPOROSIS 04/23/2009   Qualifier: Diagnosis of  By: Johnnye Sima MD, Dellis Filbert    . Ovarian cancer ([place]) 01/25/2011  . Pneumonia    hx of   . Postmenopausal atrophic vaginitis   . PROSTHETIC JOINT COMPLICATION 06/04/9561   Qualifier: Diagnosis of  By: Tommy Medal MD, Roderic Scarce    . Renal disorder    Decreased kidney function  . Staph infection 2010   after knee replacement  . Urinary frequency   . Vulvitis     Patient Active Problem List   Diagnosis Date Noted  . Syncope 04/16/2020  . AKI (acute kidney injury) ([place]) 04/16/2020  . Orthostatic hypotension 04/07/2020  . Syncope and collapse 04/06/2020  .  Fall at home, initial encounter 04/06/2020  . Acute right ankle pain 04/06/2020  . Acute lower UTI 10/07/2019  . Diarrhea 10/07/2019  . Acute cystitis without hematuria   . Muscle spasm of right lower extremity 08/05/2019  . Hyponatremia 08/05/2019  . Insomnia 08/05/2019  . Slow transit constipation 08/05/2019  . Atherosclerosis of abdominal aorta ([place]) 10/02/2015  . Obesity (BMI 30-39.9) 10/31/2014  . Chronic kidney disease (CKD), stage III (moderate) ([place]) 10/31/2014  . Ventral incisional hernia 10/31/2014  . Primary peritoneal carcinomatosis ([place]) 10/31/2014  . Incisional hernia, periumbilical, without obstruction or gangrene 11/03/2012  . Anemia associated with acute blood loss 04/03/2011  . Ovarian cancer ([place]) 01/25/2011  . HYPERLIPIDEMIA 04/23/2009  . Essential hypertension 04/23/2009  . GERD 04/23/2009  . Osteoarthritis 04/23/2009  . Disorder of bone and cartilage 04/23/2009    Past Surgical History:  Procedure Laterality Date  . ABDOMINAL HYSTERECTOMY  04/01/2011   Procedure: HYSTERECTOMY ABDOMINAL;  Surgeon: Alvino Chapel, MD;  Location: [facility];  Service: Gynecology;  Laterality: N/A;  . APPENDECTOMY    . JOINT REPLACEMENT     R knee in 2008, 5 operations on L knee  . LAPAROTOMY  04/01/2011   Procedure: EXPLORATORY LAPAROTOMY;  Surgeon: Alvino Chapel, MD;  Location: [facility];  Service: Gynecology;  Laterality: N/A;  . OTHER SURGICAL HISTORY     hx of C section 1966  . SALPINGOOPHORECTOMY  04/01/2011   Procedure: SALPINGO OOPHERECTOMY;  Surgeon: Alvino Chapel, MD;  Location: [facility];  Service: Gynecology;  Laterality: Bilateral;  . TONSILLECTOMY       OB History    Gravida      Para      Term      Preterm      AB      Living  3     SAB      IAB      Ectopic      Multiple      Live Births              Family History  Problem Relation Age of Onset  . Cancer Other        Bladder cancer  . Heart attack Brother   .  Diabetes Brother     Social History   Tobacco Use  . Smoking status: Former Smoker    Quit date: 02/11/1952    Years since quitting: 68.2  . Smokeless tobacco: Never Used  Vaping Use  . Vaping Use: Never used  Substance Use Topics  . Alcohol use: Not Currently  . Drug use: Never    Home Medications Prior to Admission medications   Medication Sig Start Date End Date Taking? Authorizing Provider  acetaminophen (TYLENOL) 500 MG tablet Take 2 tablets (1,000 mg total) by mouth 3 (three) times daily. 04/11/20   Geradine Girt, DO  azelastine (ASTELIN) 0.1 % nasal spray Place 1 spray into both nostrils at bedtime as needed for rhinitis or allergies. 12/06/15   [provider]  B Complex Vitamins (VITAMIN B COMPLEX PO) Take 1 tablet by mouth daily.     [provider]  calcium carbonate (TUMS EX) 750 MG chewable tablet Chew 750 mg by mouth daily as needed for heartburn.    [provider]  Cholecalciferol (VITAMIN D) 2000 UNITS tablet Take 2,000 Units by mouth daily.    [provider]  diclofenac Sodium (VOLTAREN) 1 % GEL Apply 2 g topically 4 (four) times daily. 04/11/20   Geradine Girt, DO  docusate sodium (COLACE) 100 MG capsule Take 100 mg by mouth daily as needed for mild constipation.     [provider]  lisinopril (ZESTRIL) 20 MG tablet Take 20 mg by mouth daily. 10/18/18   [provider]  meclizine (ANTIVERT) 12.5 MG tablet Take 1 tablet (12.5 mg total) by mouth 2 (two) times daily as needed for dizziness. 04/11/20   Eulogio Bear U, DO  melatonin 3 MG TABS tablet Take 1 tablet (3 mg total) by mouth at bedtime. 04/11/20   Geradine Girt, DO  Multiple Vitamins-Minerals (CENTRUM ADULTS PO) Take 1 tablet by mouth daily.    [provider]  RESTASIS 0.05 % ophthalmic emulsion Place 1 drop into both eyes in the morning and at bedtime. 09/02/19   [provider]  zinc oxide 20 % ointment Apply 1 application topically as  needed for irritation.    [provider]    Allergies    Morphine and related, Penicillins, Nsaids, and Shellfish allergy  Review of Systems   Review of Systems  Respiratory: Negative for shortness of breath.   Cardiovascular: Positive for chest pain.  Gastrointestinal: Negative for abdominal pain, diarrhea and vomiting.  Neurological: Positive for light-headedness.  All other systems reviewed and are negative.   Physical Exam Updated Vital Signs BP (!) 142/64   Pulse 89   Temp (!) 97.4 F (36.3 C) (Oral)   Resp (!) 22   SpO2 98%   Physical Exam Vitals and nursing note reviewed.  Constitutional:      General: She is not in acute distress.    Appearance: She is well-developed and well-nourished. She is not ill-appearing or diaphoretic.  HENT:     Head: Normocephalic and atraumatic.     Right Ear: External ear normal.     Left Ear: External ear normal.     Nose: Nose normal.  Eyes:     General:        Right eye: No discharge.        Left eye: No discharge.  Cardiovascular:     Rate and Rhythm: Normal rate and regular rhythm.     Heart sounds: Normal heart sounds.  Pulmonary:     Effort: Pulmonary effort is normal.     Breath sounds: Normal breath sounds.  Abdominal:     Palpations: Abdomen is soft.     Tenderness: There is no abdominal tenderness.  Skin:    General: Skin is warm and dry.  Neurological:     Mental Status: She is alert.     Comments: 5/5 strength in all 4 extremities. Awake, alert, no slurred speech  Psychiatric:        Mood and Affect: Mood is not anxious.     ED Results / Procedures / Treatments   Labs (all labs ordered are listed, but only abnormal results are displayed) Labs Reviewed  BASIC METABOLIC PANEL - Abnormal; Notable for the following components:      Result Value   Sodium 126 (*)    Chloride 92 (*)    Glucose, Bld 135 (*)    BUN 26 (*)    Creatinine, Ser 1.12 (*)    GFR, Estimated 47 (*)    All other components  within normal limits  URINALYSIS, ROUTINE W REFLEX MICROSCOPIC - Abnormal; Notable for the following components:   APPearance HAZY (*)    Leukocytes,Ua TRACE (*)    All other components within normal limits  TROPONIN I (HIGH SENSITIVITY) - Abnormal; Notable for the following components:   Troponin I (High Sensitivity) 20 (*)    All other components within normal limits  TROPONIN I (HIGH SENSITIVITY) - Abnormal; Notable for the following components:   Troponin I (High Sensitivity) 18 (*)    All other components within normal limits  CULTURE, BLOOD (ROUTINE X 2)  CULTURE, BLOOD (ROUTINE X 2)  SARS CORONAVIRUS 2 (TAT 6-24 HRS)  CBC  LACTIC ACID, PLASMA  MAGNESIUM  BASIC METABOLIC PANEL  CBC  MAGNESIUM    EKG EKG Interpretation  Date/Time:  Monday April 16 2020 19:09:14 EST Ventricular Rate:  81 PR Interval:  162 QRS Duration: 84 QT Interval:  399 QTC Calculation: 464 R Axis:   -42 Text Interpretation: Sinus rhythm Inferior infarct, old no acute ST/T changes Confirmed by Sherwood Gambler [phone]) on 04/16/2020 7:15:26 PM   Radiology CT Angio Chest PE W and/or Wo Contrast  Result Date: 04/16/2020 CLINICAL DATA:  Chest pain, history of prior fall, initial encounter EXAM: CT ANGIOGRAPHY CHEST WITH CONTRAST TECHNIQUE: Multidetector CT imaging of the chest was performed using the standard protocol during bolus administration of intravenous contrast. Multiplanar CT image reconstructions and MIPs were obtained to evaluate the vascular anatomy. CONTRAST:  55mL OMNIPAQUE IOHEXOL 350 MG/ML SOLN COMPARISON:  Chest x-ray from earlier in the same day. FINDINGS: Cardiovascular: Thoracic aorta demonstrates atherosclerotic calcifications without aneurysmal dilatation. The degree of aortic enhancement is not sufficient to evaluate for dissection. No cardiac enlargement is seen. Diffuse coronary calcifications are noted. The pulmonary artery shows a normal branching pattern. No filling defect to suggest  pulmonary embolism is noted. Mediastinum/Nodes: Thoracic inlet is within normal limits. No sizable hilar or mediastinal adenopathy is noted. Esophageal thickening is noted distally likely related to reflux. Hiatal hernia is seen. Lungs/Pleura: Mild bibasilar atelectatic changes are seen. Small right pleural effusion is noted. No sizable infiltrate is seen. No sizable parenchymal nodules are noted. Upper Abdomen: Visualized upper abdomen is otherwise within normal limits. Musculoskeletal: Degenerative changes of the thoracic spine are seen. No acute rib abnormality is seen. Review of the MIP images confirms the above findings. IMPRESSION: No evidence of pulmonary emboli. Small right pleural effusion and bibasilar atelectatic changes. Hiatal hernia with distal esophageal thickening likely related to reflux. Aortic Atherosclerosis (ICD10-I70.0). Electronically Signed   By: Inez Catalina M.D.   On: 04/16/2020 19:55   DG Chest Portable 1 View  Result Date: 04/16/2020 CLINICAL DATA:  Recent fall, chest pain EXAM: PORTABLE CHEST 1 VIEW COMPARISON:  08/04/2019 FINDINGS: Single frontal view of the chest demonstrates an unremarkable cardiac silhouette. No airspace disease, effusion, or pneumothorax. No acute bony abnormalities. IMPRESSION: 1. No acute intrathoracic process. Electronically Signed   By: Randa Ngo M.D.   On: 04/16/2020 18:49    Procedures Procedures   Medications Ordered in ED Medications  sodium chloride flush (NS) 0.9 % injection 3 mL (has no administration in time range)  enoxaparin (LOVENOX) injection 30 mg (has no administration in time range)  0.9 %  sodium chloride infusion (has no administration in time range)  acetaminophen (TYLENOL) tablet 650 mg (has no administration in time range)    Or  acetaminophen (TYLENOL) suppository 650 mg (has no administration in time range)  pantoprazole (PROTONIX) EC tablet 40 mg (has no administration in time range)  melatonin tablet 3 mg (has no  administration in time range)  lactated ringers bolus 1,000 mL (1,000 mLs Intravenous New Bag/Given 04/16/20 1926)  iohexol (OMNIPAQUE) 350 MG/ML injection 60 mL (60 mLs Intravenous Contrast Given 04/16/20 1943)    ED Course  I have reviewed the triage vital signs and the nursing notes.  Pertinent labs & imaging results that were available during my care of the patient were reviewed by me and considered in my medical decision making (see chart for details).    MDM Rules/Calculators/A&P                          Patient is in no acute distress.  She was hypotensive though she only had the 1 dramatic blood pressure in the 60s.  The rest of been in the 80s and 90s.  She does not appear to be symptomatic from this right now was given IV fluids with good response.  Unclear exact cause of this as her lactate, WBC are normal.  Mild AKI, unclear if this is related to p.o. intake.  Her first troponin is 20 which is probably related to the AKI and hypotension but will get a second.  Otherwise because of all the symptoms we got a CT to rule out PE.  She will need readmission and further care from the hospitalist team. No obvious infection/sepsis. Final Clinical Impression(s) /  ED Diagnoses Final diagnoses:  Hypotension, unspecified hypotension type    Rx / DC Orders ED Discharge Orders    None       Sherwood Gambler, MD 04/16/20 2309

## 2020-04-17 ENCOUNTER — Observation Stay (HOSPITAL_COMMUNITY): Payer: Medicare Other

## 2020-04-17 ENCOUNTER — Inpatient Hospital Stay (HOSPITAL_COMMUNITY): Payer: Medicare Other

## 2020-04-17 DIAGNOSIS — E46 Unspecified protein-calorie malnutrition: Secondary | ICD-10-CM | POA: Diagnosis present

## 2020-04-17 DIAGNOSIS — E871 Hypo-osmolality and hyponatremia: Secondary | ICD-10-CM | POA: Diagnosis not present

## 2020-04-17 DIAGNOSIS — Z8052 Family history of malignant neoplasm of bladder: Secondary | ICD-10-CM | POA: Diagnosis not present

## 2020-04-17 DIAGNOSIS — Z8543 Personal history of malignant neoplasm of ovary: Secondary | ICD-10-CM | POA: Diagnosis not present

## 2020-04-17 DIAGNOSIS — M7731 Calcaneal spur, right foot: Secondary | ICD-10-CM | POA: Diagnosis not present

## 2020-04-17 DIAGNOSIS — W19XXXD Unspecified fall, subsequent encounter: Secondary | ICD-10-CM | POA: Diagnosis present

## 2020-04-17 DIAGNOSIS — I708 Atherosclerosis of other arteries: Secondary | ICD-10-CM | POA: Diagnosis not present

## 2020-04-17 DIAGNOSIS — Z0389 Encounter for observation for other suspected diseases and conditions ruled out: Secondary | ICD-10-CM | POA: Diagnosis not present

## 2020-04-17 DIAGNOSIS — E43 Unspecified severe protein-calorie malnutrition: Secondary | ICD-10-CM | POA: Diagnosis not present

## 2020-04-17 DIAGNOSIS — K429 Umbilical hernia without obstruction or gangrene: Secondary | ICD-10-CM | POA: Diagnosis present

## 2020-04-17 DIAGNOSIS — R55 Syncope and collapse: Secondary | ICD-10-CM | POA: Diagnosis not present

## 2020-04-17 DIAGNOSIS — S92334D Nondisplaced fracture of third metatarsal bone, right foot, subsequent encounter for fracture with routine healing: Secondary | ICD-10-CM | POA: Diagnosis not present

## 2020-04-17 DIAGNOSIS — R0902 Hypoxemia: Secondary | ICD-10-CM | POA: Diagnosis not present

## 2020-04-17 DIAGNOSIS — R933 Abnormal findings on diagnostic imaging of other parts of digestive tract: Secondary | ICD-10-CM | POA: Diagnosis not present

## 2020-04-17 DIAGNOSIS — K219 Gastro-esophageal reflux disease without esophagitis: Secondary | ICD-10-CM | POA: Diagnosis present

## 2020-04-17 DIAGNOSIS — I951 Orthostatic hypotension: Principal | ICD-10-CM

## 2020-04-17 DIAGNOSIS — S92344D Nondisplaced fracture of fourth metatarsal bone, right foot, subsequent encounter for fracture with routine healing: Secondary | ICD-10-CM | POA: Diagnosis not present

## 2020-04-17 DIAGNOSIS — F064 Anxiety disorder due to known physiological condition: Secondary | ICD-10-CM | POA: Diagnosis present

## 2020-04-17 DIAGNOSIS — M47814 Spondylosis without myelopathy or radiculopathy, thoracic region: Secondary | ICD-10-CM | POA: Diagnosis not present

## 2020-04-17 DIAGNOSIS — M810 Age-related osteoporosis without current pathological fracture: Secondary | ICD-10-CM | POA: Diagnosis not present

## 2020-04-17 DIAGNOSIS — I10 Essential (primary) hypertension: Secondary | ICD-10-CM | POA: Diagnosis present

## 2020-04-17 DIAGNOSIS — Z96651 Presence of right artificial knee joint: Secondary | ICD-10-CM | POA: Diagnosis present

## 2020-04-17 DIAGNOSIS — Z8249 Family history of ischemic heart disease and other diseases of the circulatory system: Secondary | ICD-10-CM | POA: Diagnosis not present

## 2020-04-17 DIAGNOSIS — Z880 Allergy status to penicillin: Secondary | ICD-10-CM | POA: Diagnosis not present

## 2020-04-17 DIAGNOSIS — K5730 Diverticulosis of large intestine without perforation or abscess without bleeding: Secondary | ICD-10-CM | POA: Diagnosis not present

## 2020-04-17 DIAGNOSIS — N179 Acute kidney failure, unspecified: Secondary | ICD-10-CM | POA: Diagnosis present

## 2020-04-17 DIAGNOSIS — R079 Chest pain, unspecified: Secondary | ICD-10-CM | POA: Diagnosis not present

## 2020-04-17 DIAGNOSIS — I2510 Atherosclerotic heart disease of native coronary artery without angina pectoris: Secondary | ICD-10-CM | POA: Diagnosis present

## 2020-04-17 DIAGNOSIS — E861 Hypovolemia: Secondary | ICD-10-CM | POA: Diagnosis present

## 2020-04-17 DIAGNOSIS — K449 Diaphragmatic hernia without obstruction or gangrene: Secondary | ICD-10-CM | POA: Diagnosis present

## 2020-04-17 DIAGNOSIS — E222 Syndrome of inappropriate secretion of antidiuretic hormone: Secondary | ICD-10-CM | POA: Diagnosis present

## 2020-04-17 DIAGNOSIS — Z833 Family history of diabetes mellitus: Secondary | ICD-10-CM | POA: Diagnosis not present

## 2020-04-17 DIAGNOSIS — Z6829 Body mass index (BMI) 29.0-29.9, adult: Secondary | ICD-10-CM | POA: Diagnosis not present

## 2020-04-17 DIAGNOSIS — I700 Atherosclerosis of aorta: Secondary | ICD-10-CM | POA: Diagnosis present

## 2020-04-17 DIAGNOSIS — I350 Nonrheumatic aortic (valve) stenosis: Secondary | ICD-10-CM

## 2020-04-17 DIAGNOSIS — Z20822 Contact with and (suspected) exposure to covid-19: Secondary | ICD-10-CM | POA: Diagnosis present

## 2020-04-17 DIAGNOSIS — M2550 Pain in unspecified joint: Secondary | ICD-10-CM | POA: Diagnosis not present

## 2020-04-17 DIAGNOSIS — Z7401 Bed confinement status: Secondary | ICD-10-CM | POA: Diagnosis not present

## 2020-04-17 LAB — BASIC METABOLIC PANEL
Anion gap: 8 (ref 5–15)
Anion gap: 8 (ref 5–15)
BUN: 27 mg/dL — ABNORMAL HIGH (ref 8–23)
BUN: 22 mg/dL (ref 8–23)
CO2: 26 mmol/L (ref 22–32)
CO2: 22 mmol/L (ref 22–32)
Calcium: 8.9 mg/dL (ref 8.9–10.3)
Calcium: 8.8 mg/dL — ABNORMAL LOW (ref 8.9–10.3)
Chloride: 95 mmol/L — ABNORMAL LOW (ref 98–111)
Chloride: 99 mmol/L (ref 98–111)
Creatinine, Ser: 1.06 mg/dL — ABNORMAL HIGH (ref 0.44–1.00)
Creatinine, Ser: 1.02 mg/dL — ABNORMAL HIGH (ref 0.44–1.00)
GFR, Estimated: 51 mL/min — ABNORMAL LOW (ref >60)
GFR, Estimated: 53 mL/min — ABNORMAL LOW (ref >60)
Glucose, Bld: 129 mg/dL — ABNORMAL HIGH (ref 70–99)
Glucose, Bld: 125 mg/dL — ABNORMAL HIGH (ref 70–99)
Potassium: 4.7 mmol/L (ref 3.5–5.1)
Potassium: 4.3 mmol/L (ref 3.5–5.1)
Sodium: 129 mmol/L — ABNORMAL LOW (ref 135–145)
Sodium: 129 mmol/L — ABNORMAL LOW (ref 135–145)

## 2020-04-17 LAB — CBC
HCT: 35.3 % — ABNORMAL LOW (ref 36.0–46.0)
Hemoglobin: 12 g/dL (ref 12.0–15.0)
MCH: 29 pg (ref 26.0–34.0)
MCHC: 34 g/dL (ref 30.0–36.0)
MCV: 85.3 fL (ref 80.0–100.0)
Platelets: 326 10*3/uL (ref 150–400)
RBC: 4.14 MIL/uL (ref 3.87–5.11)
RDW: 15.6 % — ABNORMAL HIGH (ref 11.5–15.5)
WBC: 9.9 10*3/uL (ref 4.0–10.5)
nRBC: 0 % (ref 0.0–0.2)

## 2020-04-17 LAB — MRSA PCR SCREENING: MRSA by PCR: NEGATIVE

## 2020-04-17 LAB — MAGNESIUM: Magnesium: 1.8 mg/dL (ref 1.7–2.4)

## 2020-04-17 LAB — GLUCOSE, CAPILLARY: Glucose-Capillary: 102 mg/dL — ABNORMAL HIGH (ref 70–99)

## 2020-04-17 LAB — SARS CORONAVIRUS 2 (TAT 6-24 HRS): SARS Coronavirus 2: NEGATIVE

## 2020-04-17 MED ORDER — SODIUM CHLORIDE 1 G PO TABS: 1.0000 g | ORAL_TABLET | Freq: Two times a day (BID) | ORAL | 0 refills | Status: DC

## 2020-04-17 MED ORDER — IOPAMIDOL (ISOVUE-300) INJECTION 61%
50.0000 mL | Freq: Once | INTRAVENOUS | Status: AC | PRN
  Administered 2020-04-17: 50 mL via ORAL

## 2020-04-17 MED ORDER — PANTOPRAZOLE SODIUM 40 MG PO TBEC: 40.0000 mg | DELAYED_RELEASE_TABLET | Freq: Every day | ORAL | 0 refills | Status: DC

## 2020-04-17 MED ORDER — ENOXAPARIN SODIUM 40 MG/0.4ML ~~LOC~~ SOLN
40.0000 mg | SUBCUTANEOUS | Status: DC
  Administered 2020-04-17 – 2020-04-24 (×8): 40 mg via SUBCUTANEOUS
  Filled 2020-04-17 (×8): qty 0.4

## 2020-04-17 MED ORDER — CALCIUM CARBONATE ANTACID 500 MG PO CHEW
1.0000 | CHEWABLE_TABLET | ORAL | Status: DC | PRN
  Administered 2020-04-17 (×2): 200 mg via ORAL
  Filled 2020-04-17 (×2): qty 1

## 2020-04-17 MED ORDER — PANTOPRAZOLE SODIUM 40 MG IV SOLR
40.0000 mg | Freq: Two times a day (BID) | INTRAVENOUS | Status: DC
  Administered 2020-04-17 – 2020-04-21 (×8): 40 mg via INTRAVENOUS
  Filled 2020-04-17 (×8): qty 40

## 2020-04-17 MED ORDER — SODIUM CHLORIDE 0.9 % IV SOLN: INTRAVENOUS | Status: DC

## 2020-04-17 MED ORDER — SODIUM CHLORIDE 1 G PO TABS
1.0000 g | ORAL_TABLET | Freq: Two times a day (BID) | ORAL | Status: DC
  Filled 2020-04-17 (×3): qty 1

## 2020-04-17 NOTE — NC FL2 (Signed)
[place] LEVEL OF CARE SCREENING TOOL     IDENTIFICATION  Patient Name: [name] Birthdate: [birth year] Sex: female Admission Date (Current Location): 04/16/2020  [facility] and [place] Number:  Herbalist and Address:  [facility] [place], [place] [address], [place], [place] [address]      Provider Number: [id number]  Attending Physician Name and Address:  Kayleen Memos, DO  Relative Name and Phone Number:  Hasel, Janish (Daughter)   [phone]    Current Level of Care: Hospital Recommended Level of Care: [place] Prior Approval Number:    Date Approved/Denied:   PASRR Number: [id number]  Discharge Plan: SNF    Current Diagnoses: Patient Active Problem List   Diagnosis Date Noted  . Syncope 04/16/2020  . AKI (acute kidney injury) ([place]) 04/16/2020  . Orthostatic hypotension 04/07/2020  . Syncope and collapse 04/06/2020  . Fall at home, initial encounter 04/06/2020  . Acute right ankle pain 04/06/2020  . Acute lower UTI 10/07/2019  . Diarrhea 10/07/2019  . Acute cystitis without hematuria   . Muscle spasm of right lower extremity 08/05/2019  . Hyponatremia 08/05/2019  . Insomnia 08/05/2019  . Slow transit constipation 08/05/2019  . Atherosclerosis of abdominal aorta ([place]) 10/02/2015  . Obesity (BMI 30-39.9) 10/31/2014  . Chronic kidney disease (CKD), stage III (moderate) ([place]) 10/31/2014  . Ventral incisional hernia 10/31/2014  . Primary peritoneal carcinomatosis ([place]) 10/31/2014  . Incisional hernia, periumbilical, without obstruction or gangrene 11/03/2012  . Anemia associated with acute blood loss 04/03/2011  . Ovarian cancer ([place]) 01/25/2011  . HYPERLIPIDEMIA 04/23/2009  . Essential hypertension 04/23/2009  . GERD 04/23/2009  . Osteoarthritis 04/23/2009  . Disorder of bone and cartilage 04/23/2009    Orientation RESPIRATION BLADDER Height & Weight     Self,Situation  Normal Incontinent  Weight: 167 lb 5.3 oz (75.9 kg) Height:  5\' 3"  (160 cm)  BEHAVIORAL SYMPTOMS/MOOD NEUROLOGICAL BOWEL NUTRITION STATUS      Incontinent Diet (See DC Summary)  AMBULATORY STATUS COMMUNICATION OF NEEDS Skin   Extensive Assist Verbally Normal                       Personal Care Assistance Level of Assistance  Bathing,Feeding,Dressing Bathing Assistance: Maximum assistance Feeding assistance: Limited assistance Dressing Assistance: Maximum assistance     Functional Limitations Info  Sight,Hearing,Speech Sight Info: Adequate Hearing Info: Adequate Speech Info: Adequate    SPECIAL CARE FACTORS FREQUENCY        PT Frequency: 5x a week OT Frequency: 5x a week            Contractures Contractures Info: Not present    Additional Factors Info  Code Status,Allergies Code Status Info: Full Allergies Info: Morphine and Related Penicillins Nsaids Shellfish Allergy           Current Medications (04/17/2020):  This is the current hospital active medication list Current Facility-Administered Medications  Medication Dose Route Frequency Provider Last Rate Last Admin  . acetaminophen (TYLENOL) tablet 650 mg  650 mg Oral Q6H PRN Lenore Cordia, MD       Or  . acetaminophen (TYLENOL) suppository 650 mg  650 mg Rectal Q6H PRN Lenore Cordia, MD      . calcium carbonate (TUMS - dosed in mg elemental calcium) chewable tablet 200 mg of elemental calcium  1 tablet Oral PRN Irene Pap N, DO   200 mg of elemental calcium at 04/17/20 1356  .  enoxaparin (LOVENOX) injection 40 mg  40 mg Subcutaneous Q24H Einar Grad, RPH      . melatonin tablet 3 mg  3 mg Oral QHS Zada Finders R, MD   3 mg at 04/17/20 0000  . pantoprazole (PROTONIX) EC tablet 40 mg  40 mg Oral BID Lenore Cordia, MD   40 mg at 04/17/20 0908  . sodium chloride flush (NS) 0.9 % injection 3 mL  3 mL Intravenous Q12H Zada Finders R, MD   3 mL at 04/17/20 1357  . sodium chloride tablet 1 g  1 g Oral BID WC Kayleen Memos, DO         Discharge Medications: Please see discharge summary for a list of discharge medications.  Relevant Imaging Results:  Relevant Lab Results:   Additional Information SSN [id number], Moderna COVID-19 Vaccine 12/26/2019 , 03/14/2019 , 02/14/2019  Reece Agar, LCSWA

## 2020-04-17 NOTE — Discharge Instructions (Signed)
Hypotension As your heart beats, it forces blood through your body. This force is called blood pressure. If you have hypotension, you have low blood pressure. When your blood pressure is too low, you may not get enough blood to your brain or other parts of your body. This may cause you to feel weak, light-headed, have a fast heartbeat, or even pass out (faint). Low blood pressure may be harmless, or it may cause serious problems. What are the causes?  Blood loss.  Not enough water in the body (dehydration).  Heart problems.  Hormone problems.  Pregnancy.  A very bad infection.  Not having enough of certain nutrients.  Very bad allergic reactions.  Certain medicines. What increases the risk?  Age. The risk increases as you get older.  Conditions that affect the heart or the brain and spinal cord (central nervous system).  Taking certain medicines.  Being pregnant. What are the signs or symptoms?  Feeling: ? Weak. ? Light-headed. ? Dizzy. ? Tired (fatigued).  Blurred vision.  Fast heartbeat.  Passing out, in very bad cases. How is this treated?  Changing your diet. This may involve eating more salt (sodium) or drinking more water.  Taking medicines to raise your blood pressure.  Changing how much you take (the dosage) of some of your medicines.  Wearing compression stockings. These stockings help to prevent blood clots and reduce swelling in your legs. In some cases, you may need to go to the hospital for:  Fluid replacement. This means you will receive fluids through an IV tube.  Blood replacement. This means you will receive donated blood through an IV tube (transfusion).  Treating an infection or heart problems, if this applies.  Monitoring. You may need to be monitored while medicines that you are taking wear off. Follow these instructions at home: Eating and drinking  Drink enough fluids to keep your pee (urine) pale yellow.  Eat a healthy diet.  Follow instructions from your doctor about what you can eat or drink. A healthy diet includes: ? Fresh fruits and vegetables. ? Whole grains. ? Low-fat (lean) meats. ? Low-fat dairy products.  Eat extra salt only as told. Do not add extra salt to your diet unless your doctor tells you to.  Eat small meals often.  Avoid standing up quickly after you eat.   Medicines  Take over-the-counter and prescription medicines only as told by your doctor. ? Follow instructions from your doctor about changing how much you take of your medicines, if this applies. ? Do not stop or change any of your medicines on your own. General instructions  Wear compression stockings as told by your doctor.  Get up slowly from lying down or sitting.  Avoid hot showers and a lot of heat as told by your doctor.  Return to your normal activities as told by your doctor. Ask what activities are safe for you.  Do not use any products that contain nicotine or tobacco, such as cigarettes, e-cigarettes, and chewing tobacco. If you need help quitting, ask your doctor.  Keep all follow-up visits as told by your doctor. This is important.   Contact a doctor if:  You throw up (vomit).  You have watery poop (diarrhea).  You have a fever for more than 2-3 days.  You feel more thirsty than normal.  You feel weak and tired. Get help right away if:  You have chest pain.  You have a fast or uneven heartbeat.  You lose feeling (have numbness)   in any part of your body.  You cannot move your arms or your legs.  You have trouble talking.  You get sweaty or feel light-headed.  You pass out.  You have trouble breathing.  You have trouble staying awake.  You feel mixed up (confused). Summary  Hypotension is also called low blood pressure. It is when the force of blood pumping through your arteries is too weak.  Hypotension may be harmless, or it may cause serious problems.  Treatment may include changing  your diet and medicines, and wearing compression stockings.  In very bad cases, you may need to go to the hospital. This information is not intended to replace advice given to you by your health care provider. Make sure you discuss any questions you have with your health care provider. Document Revised: 07/23/2017 Document Reviewed: 07/23/2017 Elsevier Patient Education  2021 [employer]vier Inc.  

## 2020-04-17 NOTE — Progress Notes (Signed)
Correct time

## 2020-04-17 NOTE — Evaluation (Signed)
Physical Therapy Evaluation Patient Details Name: [name] MRN: [id number] DOB: [birth year] Today's Date: 04/17/2020   History of Present Illness  Pt is an 87 y/o female admitted 3/7 following syncopal episode at [facility]. Pt with admission 2/25-3/2 for syncope with fall. PMH includes ovarian CA; HTN, HLD, chronic hyponatremia, insomnia  Clinical Impression  Pt pleasant, HOH and reports getting lonely in room without anyone there. Pt with maintained Rt ankle pain from prior admission due to fall without brace present today. Pt with decreased activity tolerance, balance and safety limited by cardiopulmonary status who will benefit from acute therapy to maximize mobility safety and function.   Supine 150/68 (92) EOB 134/81 (97) Stand pivot 121/69 (87) No report of dizziness or presyncope with pt fearful of falling throughout    Follow Up Recommendations SNF;Supervision for mobility/OOB    Equipment Recommendations  None recommended by PT    Recommendations for Other Services       Precautions / Restrictions Precautions Precautions: Fall;Other (comment) Precaution Comments: watch BP- orthostasis Required Braces or Orthoses: Other Brace Other Brace: rt ankle bace- not present during today's session with daughter to bring and standing limited with lack of brace      Mobility  Bed Mobility Overal bed mobility: Needs Assistance Bed Mobility: Supine to Sit     Supine to sit: Min guard;HOB elevated     General bed mobility comments: HOB 25 degrees with increased time to transition to EOB with use of rail toward left    Transfers Overall transfer level: Needs assistance   Transfers: Sit to/from Stand;Stand Pivot Transfers Sit to Stand: Min assist Stand pivot transfers: Min assist       General transfer comment: min assist to stand from bed and BSC with pivot bed>BSC>recliner with single UE support on furniture and single UE support on therapist  Ambulation/Gait              General Gait Details: unable due to lack of ankle brace  Stairs            Wheelchair Mobility    Modified Rankin (Stroke Patients Only)       Balance Overall balance assessment: Needs assistance;History of Falls   Sitting balance-Leahy Scale: Fair Sitting balance - Comments: sitting without UE support EOB and at [facility]   Standing balance support: Bilateral upper extremity supported Standing balance-Leahy Scale: Poor Standing balance comment: reliant on UE support for pericare and transfers                             Pertinent Vitals/Pain Pain Score: 3  Pain Location: R ankle Pain Descriptors / Indicators: Aching Pain Intervention(s): Limited activity within patient's tolerance;Repositioned    Home Living Family/patient expects to be discharged to:: Skilled nursing facility                 Additional Comments: Pt at SNF since last admission and was alone in ILF prior to that    Prior Function Level of Independence: Needs assistance   Gait / Transfers Assistance Needed: supervision for all transfers due to syncope and BP issues  ADL's / Homemaking Assistance Needed: supervision for ADLs with staff assisting or performing iADL        Hand Dominance        Extremity/Trunk Assessment   Upper Extremity Assessment Upper Extremity Assessment: Generalized weakness    Lower Extremity Assessment Lower Extremity Assessment: Generalized weakness  Cervical / Trunk Assessment Cervical / Trunk Assessment: Kyphotic  Communication   Communication: HOH  Cognition Arousal/Alertness: Awake/alert Behavior During Therapy: WFL for tasks assessed/performed Overall Cognitive Status: Impaired/Different from baseline Area of Impairment: Memory                     Memory: Decreased short-term memory                General Comments      Exercises General Exercises - Lower Extremity Long Arc Quad: AROM;Both;Seated;20  reps Hip Flexion/Marching: AROM;Both;Seated;20 reps   Assessment/Plan    PT Assessment Patient needs continued PT services  PT Problem List Decreased strength;Decreased balance;Decreased activity tolerance;Decreased mobility;Decreased cognition;Decreased safety awareness;Decreased knowledge of use of DME;Decreased knowledge of precautions;Cardiopulmonary status limiting activity       PT Treatment Interventions DME instruction;Gait training;Functional mobility training;Therapeutic exercise;Balance training;Therapeutic activities;Patient/family education    PT Goals (Current goals can be found in the Care Plan section)  Acute Rehab PT Goals Patient Stated Goal: to be able to walk, watch turner movies PT Goal Formulation: With patient Time For Goal Achievement: 05/01/20 Potential to Achieve Goals: Fair    Frequency Min 2X/week   Barriers to discharge        Co-evaluation               AM-PAC PT "6 Clicks" Mobility  Outcome Measure Help needed turning from your back to your side while in a flat bed without using bedrails?: A Little Help needed moving from lying on your back to sitting on the side of a flat bed without using bedrails?: A Little Help needed moving to and from a bed to a chair (including a wheelchair)?: A Little Help needed standing up from a chair using your arms (e.g., wheelchair or bedside chair)?: A Little Help needed to walk in hospital room?: A Lot Help needed climbing 3-5 steps with a railing? : Total 6 Click Score: 15    End of Session   Activity Tolerance: Patient tolerated treatment well Patient left: in chair;with call bell/phone within reach;with chair alarm set Nurse Communication: Mobility status;Precautions PT Visit Diagnosis: Unsteadiness on feet (R26.81);Muscle weakness (generalized) (M62.81);Difficulty in walking, not elsewhere classified (R26.2)    Time: 1035-1110 PT Time Calculation (min) (ACUTE ONLY): 35 min   Charges:   PT  Evaluation $PT Eval Moderate Complexity: 1 Mod PT Treatments $Therapeutic Activity: 8-22 mins        Giuseppina Quinones P, PT Acute Rehabilitation Services Pager: [phone] Office: [phone]   Sandy Salaam Yeng Frankie 04/17/2020, 12:14 PM

## 2020-04-17 NOTE — Discharge Summary (Addendum)
Discharge Summary  [name] FVC:[id number] DOB: [birth year]  PCP: Hulan Fess, MD  Admit date: 04/16/2020 Discharge date: 04/17/2020  Time spent: 35 minutes.  Recommendations for Outpatient Follow-up:  1. Follow-up with your primary care provider within a week. 2. Repeat BMP on Monday, 04/23/2020 either at SNF or at your PCPs office to reevaluate your hyponatremia. 3. Continue PT OT with assistance. 4. Continue fall precautions. 5. Take your medications as prescribed.  Discharge Diagnoses:  Active Hospital Problems   Diagnosis Date Noted  . Syncope 04/16/2020  . AKI (acute kidney injury) ([place]) 04/16/2020  . Hyponatremia 08/05/2019    Resolved Hospital Problems  No resolved problems to display.    Discharge Condition: Stable.  Diet recommendation: Resume previous diet.  Vitals:   04/17/20 0700 04/17/20 0800  BP: (!) 107/59 126/72  Pulse: 76 84  Resp: 19 16  Temp: 98.3 F (36.8 C) 98.3 F (36.8 C)  SpO2: 97% 95%    History of present illness:  [name] is a 87 y.o. female with medical history significant for hypertension, remote ovarian cancer, chronic hyponatremia who presents to the ED from SNF for evaluation of syncope and chest pain.  History is supplemented by daughter at bedside.  Patient was recently admitted from 04/06/2020-04/11/2020 for syncope with fall, suspected orthostatic versus micturition syncope in etiology +/- Ambien effect.  TTE 04/06/2020 showed EF 70-75%, G1 DD, mild AR and AS.  Her Ambien, Norvasc, and nadolol were discontinued, lisinopril was continued on discharge.  TED hose and advised to increase oral intake were recommended.  She was discharged to SNF.  Daughter states that at her SNF, on the day of presentation, nursing reported that around 2 PM they were assisting her to ambulate to the bathroom when she became lightheaded/dizzy.  They sat her on the commode and she developed a transient unresponsive episode.  She did not void  prior to this unresponsive period.  Her blood pressure was reportedly 150s/99.  Patient does state that she becomes easily lightheaded/dizzy when changing positions and soon after beginning to walk.  Around 3 PM patient developed a midsternal pressure-like chest discomfort without radiation.  She did not have any associated diaphoresis, nausea, vomiting, or dyspnea.  Symptoms have since resolved.  She reports chronic heartburn/acid reflux related to a hiatal hernia which is notably different than the symptoms she was experiencing.  Due to her chest discomfort she was sent to the ED for further evaluation.  ED Course:  On arrival patient was hypotensive with BP 66/52, map 59, pulse 120, RR 16, temp 98.2 F, SPO2 98% on room air.  Patient was given 1 L LR with improvement in blood pressure.  Labs significant for creatinine 1.12 (0.85 on 04/09/2020), BUN 26, sodium 126 (131 04/01/2020), potassium 4.4, bicarb 23, serum glucose 135, WBC 9.5, hemoglobin 13.1, platelets 395,000, troponin 20.  Blood cultures and SARS-CoV-2 PCR panel were collected.  COVID-19 screening test negative.  Portable chest x-ray negative for focal consolidation, edema, or effusion.  CTA chest PE study was negative for evidence of PE.  Small right pleural effusion and bibasilar atelectatic changes noted.  Hiatal hernia with distal esophageal thickening also seen.  04/17/20: Patient was seen and examined at her bedside.  There were no acute events overnight.  Her main complaint is with acid reflux.  Advised to keep TED hose in place for orthostatic hypotension.  Started on salt tablets for hyponatremia.  Encouraged to increase her oral protein calorie intake.  Vital signs  and labs have been reviewed and are stable.   Hospital Course:  Principal Problem:   Syncope Active Problems:   Hyponatremia   AKI (acute kidney injury) ([place])  [name] is a 87 y.o. female with medical history significant for hypertension, remote  ovarian cancer, chronic hyponatremia who is admitted with syncope, secondary to orthostatic hypotension.  Recurrent syncopal episode with orthostatic hypotension. She has positive orthostatic vital signs. She has received IV fluid. Advised to keep TED hose in place. Continue to hold off BP medications. PT assessment recommended SNF. Continue PT OT with assistance and fall precautions. TOC assisting with SNF placement.  Resolved chest pain: Patient with transient midsternal pressure-like chest discomfort without radiation.  She says this felt different than her reflux symptoms.  EKG is reassuring without acute ischemic changes.  Troponin is weakly positive at 20, trended down to 18.  No prior known cardiac history.  Diffuse coronary calcifications and aortic atherosclerosis noted on CTA on admission.  Resolving AKI likely prerenal in the setting of dehydration: Creatinine 1.12 on admission up from 0.85 one week ago.  Likely prerenal from hypovolemia. Received IV fluid hydration Continue to hold off blood pressure medications. Creatinine downtrending 1.02 from 1.12. Continue to avoid nephrotoxic agents, hypotension, and dehydration.  Acute on chronic hyponatremia: Sodium 126 on admission, baseline around 130.  Likely hypovolemic hyponatremia.   She has received IV NS @ 125 mL/hour overnight and repeat labs in AM. Repeated serum sodium 129. Continue salt tablets 1 g twice daily x10 days. Repeat BMP on Monday, 04/23/2020 to reassess serum sodium.  Resolved hypotension with history of hypertension: BP this morning 109/88 Rest of vital signs stable, heart rate 89, respiratory rate 19, O2 saturation 97% on room air. Continue to hold off BP medications. Follow-up with your PCP within a week.  GERD Continue daily PPI Follow up with your PCP   Code Status: Full code, confirmed with patient  Consults called: None   Discharge Exam: BP 126/72 (BP Location: Right Arm)   Pulse 84    Temp 98.3 F (36.8 C) (Oral)   Resp 16   Ht 5\' 3"  (1.6 m)   Wt 75.9 kg   SpO2 95%   BMI 29.64 kg/m  . General: 87 y.o. year-old female well developed well nourished in no acute distress.  Alert and interactive. . Cardiovascular: Regular rate and rhythm with no rubs or gallops.   Marland Kitchen Respiratory: Clear to auscultation with no wheezes or rales.  . Abdomen: Soft nontender nondistended with normal bowel sounds x4 quadrants. . Musculoskeletal: No lower extremity edema bilaterally. Marland Kitchen Psychiatry: Mood is appropriate for condition and setting  Discharge Instructions You were cared for by a hospitalist during your hospital stay. If you have any questions about your discharge medications or the care you received while you were in the hospital after you are discharged, you can call the unit and asked to speak with the hospitalist on call if the hospitalist that took care of you is not available. Once you are discharged, your primary care physician will handle any further medical issues. Please note that NO REFILLS for any discharge medications will be authorized once you are discharged, as it is imperative that you return to your primary care physician (or establish a relationship with a primary care physician if you do not have one) for your aftercare needs so that they can reassess your need for medications and monitor your lab values.   Allergies as of 04/17/2020  Reactions   Morphine And Related Other (See Comments)   Given after knee replacement and code blue occurred   Penicillins Hives, Itching   syncope   Nsaids Other (See Comments)   abn kidney test   Shellfish Allergy Nausea And Vomiting, Other (See Comments)   Pt has shellfish allergy only.  Has had IV contrast x 2 and did fine.      Medication List    STOP taking these medications   lisinopril 20 MG tablet Commonly known as: ZESTRIL     TAKE these medications   acetaminophen 500 MG tablet Commonly known as: TYLENOL Take 2  tablets (1,000 mg total) by mouth 3 (three) times daily.   azelastine 0.1 % nasal spray Commonly known as: ASTELIN Place 1 spray into both nostrils at bedtime as needed for rhinitis or allergies.   calcium carbonate 750 MG chewable tablet Commonly known as: TUMS EX Chew 750 mg by mouth daily as needed for heartburn.   CENTRUM ADULTS PO Take 1 tablet by mouth daily.   diclofenac Sodium 1 % Gel Commonly known as: VOLTAREN Apply 2 g topically 4 (four) times daily. What changed: additional instructions   docusate sodium 100 MG capsule Commonly known as: COLACE Take 100 mg by mouth at bedtime as needed for mild constipation.   meclizine 12.5 MG tablet Commonly known as: ANTIVERT Take 1 tablet (12.5 mg total) by mouth 2 (two) times daily as needed for dizziness.   melatonin 3 MG Tabs tablet Take 1 tablet (3 mg total) by mouth at bedtime.   pantoprazole 40 MG tablet Commonly known as: PROTONIX Take 1 tablet (40 mg total) by mouth daily.   Restasis 0.05 % ophthalmic emulsion Generic drug: cycloSPORINE Place 1 drop into both eyes in the morning and at bedtime.   sodium chloride 1 g tablet Take 1 tablet (1 g total) by mouth 2 (two) times daily with a meal for 10 days.   VITAMIN B COMPLEX PO Take 1 tablet by mouth daily.   Vitamin D 50 MCG (2000 UT) tablet Take 2,000 Units by mouth daily.   zinc oxide 20 % ointment Apply 1 application topically as needed for irritation.      Allergies  Allergen Reactions  . Morphine And Related Other (See Comments)    Given after knee replacement and code blue occurred  . Penicillins Hives and Itching    syncope  . Nsaids Other (See Comments)    abn kidney test  . Shellfish Allergy Nausea And Vomiting and Other (See Comments)    Pt has shellfish allergy only.  Has had IV contrast x 2 and did fine.      The results of significant diagnostics from this hospitalization (including imaging, microbiology, ancillary and laboratory) are  listed below for reference.    Significant Diagnostic Studies: DG Ankle Complete Right  Result Date: 04/06/2020 CLINICAL DATA:  Syncopal episode. EXAM: RIGHT ANKLE - COMPLETE 3+ VIEW COMPARISON:  June 09, 2017 FINDINGS: There is no evidence of an acute fracture, dislocation, or joint effusion. A chronic deformity is seen involving the distal shaft of the right fibula. There is a moderate-sized plantar calcaneal spur. Soft tissues are unremarkable. IMPRESSION: Chronic deformity of the distal right fibula. Electronically Signed   By: Virgina Norfolk M.D.   On: 04/06/2020 02:45   CT HEAD WO CONTRAST  Result Date: 04/06/2020 CLINICAL DATA:  Syncopal episode. EXAM: CT HEAD WITHOUT CONTRAST TECHNIQUE: Contiguous axial images were obtained from the base of the  skull through the vertex without intravenous contrast. COMPARISON:  March 02, 2020 FINDINGS: Brain: There is mild cerebral atrophy with widening of the extra-axial spaces and ventricular dilatation. There are areas of decreased attenuation within the white matter tracts of the supratentorial brain, consistent with microvascular disease changes. A stable 1 cm para falcine calcified right occipital lobe meningioma is noted. Vascular: No hyperdense vessels are identified. Skull: Nondisplaced bilateral nasal bone fractures of indeterminate age are seen. Sinuses/Orbits: Marked severity sphenoid sinus mucosal thickening is present. Other: Mild to moderate severity frontal scalp soft tissue swelling is noted along the midline. IMPRESSION: 1. Mild to moderate severity frontal scalp soft tissue swelling without evidence of an acute fracture or acute intracranial abnormality. 2. Stable 1 cm para falcine calcified right occipital lobe meningioma. 3. Bilateral nondisplaced nasal bone fractures of indeterminate age. Electronically Signed   By: Virgina Norfolk M.D.   On: 04/06/2020 02:42   CT Angio Chest PE W and/or Wo Contrast  Result Date: 04/16/2020 CLINICAL  DATA:  Chest pain, history of prior fall, initial encounter EXAM: CT ANGIOGRAPHY CHEST WITH CONTRAST TECHNIQUE: Multidetector CT imaging of the chest was performed using the standard protocol during bolus administration of intravenous contrast. Multiplanar CT image reconstructions and MIPs were obtained to evaluate the vascular anatomy. CONTRAST:  68mL OMNIPAQUE IOHEXOL 350 MG/ML SOLN COMPARISON:  Chest x-ray from earlier in the same day. FINDINGS: Cardiovascular: Thoracic aorta demonstrates atherosclerotic calcifications without aneurysmal dilatation. The degree of aortic enhancement is not sufficient to evaluate for dissection. No cardiac enlargement is seen. Diffuse coronary calcifications are noted. The pulmonary artery shows a normal branching pattern. No filling defect to suggest pulmonary embolism is noted. Mediastinum/Nodes: Thoracic inlet is within normal limits. No sizable hilar or mediastinal adenopathy is noted. Esophageal thickening is noted distally likely related to reflux. Hiatal hernia is seen. Lungs/Pleura: Mild bibasilar atelectatic changes are seen. Small right pleural effusion is noted. No sizable infiltrate is seen. No sizable parenchymal nodules are noted. Upper Abdomen: Visualized upper abdomen is otherwise within normal limits. Musculoskeletal: Degenerative changes of the thoracic spine are seen. No acute rib abnormality is seen. Review of the MIP images confirms the above findings. IMPRESSION: No evidence of pulmonary emboli. Small right pleural effusion and bibasilar atelectatic changes. Hiatal hernia with distal esophageal thickening likely related to reflux. Aortic Atherosclerosis (ICD10-I70.0). Electronically Signed   By: Inez Catalina M.D.   On: 04/16/2020 19:55   DG Chest Portable 1 View  Result Date: 04/16/2020 CLINICAL DATA:  Recent fall, chest pain EXAM: PORTABLE CHEST 1 VIEW COMPARISON:  08/04/2019 FINDINGS: Single frontal view of the chest demonstrates an unremarkable cardiac  silhouette. No airspace disease, effusion, or pneumothorax. No acute bony abnormalities. IMPRESSION: 1. No acute intrathoracic process. Electronically Signed   By: Randa Ngo M.D.   On: 04/16/2020 18:49   DG Foot Complete Right  Result Date: 04/08/2020 CLINICAL DATA:  Lateral foot pain and swelling. EXAM: RIGHT FOOT COMPLETE - 3+ VIEW COMPARISON:  None. FINDINGS: Old fourth metatarsal fracture. Mild diffuse decreased bone mineralization. Mild degenerate changes over the midfoot and hindfoot. Small inferior calcaneal spur. Subtle transverse lucencies at the base of the third and fourth metatarsals on the oblique view likely within normal although could not exclude subtle nondisplaced fractures. IMPRESSION: Subtle transverse lucencies at the base of the third and fourth metatarsals on the oblique view likely within normal, although could represent subtle nondisplaced fractures. Consider follow-up radiograph 7-10 days. Electronically Signed   By: Marin Olp M.D.  On: 04/08/2020 11:12   ECHOCARDIOGRAM COMPLETE  Result Date: 04/06/2020    ECHOCARDIOGRAM REPORT   Patient Name:   [name] [facility] Date of Exam: 04/06/2020 Medical Rec #:  [id number]        Height:       63.0 in Accession #:    [id number]       Weight:       155.0 lb Date of Birth:  02/07/1930        BSA:          1.735 m Patient Age:    35 years         BP:           140/64 mmHg Patient Gender: F                HR:           84 bpm. Exam Location:  Inpatient Procedure: 2D Echo, Cardiac Doppler and Color Doppler Indications:    R55 Syncope  History:        Patient has prior history of Echocardiogram examinations, most                 recent 01/04/2019. Signs/Symptoms:Murmur, Shortness of Breath                 and Dyspnea; Risk Factors:Hypertension, Dyslipidemia and Former                 Smoker.  Sonographer:    Roseanna Rainbow RDCS Referring Phys: [id number] JENNIFER YATES  Sonographer Comments: Technically difficult study due to poor echo windows.  Patient had recent fall, could not turn. Patient sensitive to pressure from probe in apical region. IMPRESSIONS  1. Left ventricular ejection fraction, by estimation, is 70 to 75%. The left ventricle has hyperdynamic function. The left ventricle has no regional wall motion abnormalities. There is mild left ventricular hypertrophy. Left ventricular diastolic parameters are consistent with Grade I diastolic dysfunction (impaired relaxation).  2. Right ventricular systolic function is normal. The right ventricular size is normal.  3. The mitral valve is normal in structure. No evidence of mitral valve regurgitation. No evidence of mitral stenosis.  4. The aortic valve is tricuspid. Aortic valve regurgitation is mild. Mild aortic valve stenosis. Aortic valve area, by VTI measures 1.91 cm. Aortic valve mean gradient measures 10.0 mmHg. Aortic valve Vmax measures 2.12 m/s.  5. The inferior vena cava is normal in size with greater than 50% respiratory variability, suggesting right atrial pressure of 3 mmHg. FINDINGS  Left Ventricle: Left ventricular ejection fraction, by estimation, is 70 to 75%. The left ventricle has hyperdynamic function. The left ventricle has no regional wall motion abnormalities. The left ventricular internal cavity size was normal in size. There is mild left ventricular hypertrophy. Left ventricular diastolic parameters are consistent with Grade I diastolic dysfunction (impaired relaxation). Right Ventricle: The right ventricular size is normal. No increase in right ventricular wall thickness. Right ventricular systolic function is normal. Left Atrium: Left atrial size was normal in size. Right Atrium: Right atrial size was normal in size. Pericardium: There is no evidence of pericardial effusion. Mitral Valve: The mitral valve is normal in structure. Mild mitral annular calcification. No evidence of mitral valve regurgitation. No evidence of mitral valve stenosis. Tricuspid Valve: The tricuspid  valve is normal in structure. Tricuspid valve regurgitation is not demonstrated. No evidence of tricuspid stenosis. Aortic Valve: The aortic valve is tricuspid. Aortic valve regurgitation is mild. Aortic regurgitation PHT measures  393 msec. Mild aortic stenosis is present. Aortic valve mean gradient measures 10.0 mmHg. Aortic valve peak gradient measures 18.0 mmHg. Aortic valve area, by VTI measures 1.91 cm. Pulmonic Valve: The pulmonic valve was normal in structure. Pulmonic valve regurgitation is not visualized. No evidence of pulmonic stenosis. Aorta: The aortic root is normal in size and structure. Venous: The inferior vena cava is normal in size with greater than 50% respiratory variability, suggesting right atrial pressure of 3 mmHg. IAS/Shunts: No atrial level shunt detected by color flow Doppler.  LEFT VENTRICLE PLAX 2D LVIDd:         3.40 cm     Diastology LVIDs:         2.00 cm     LV e' medial:    6.96 cm/s LV PW:         1.10 cm     LV E/e' medial:  14.2 LV IVS:        1.30 cm     LV e' lateral:   4.90 cm/s LVOT diam:     1.90 cm     LV E/e' lateral: 20.1 LV SV:         93 LV SV Index:   54 LVOT Area:     2.84 cm  LV Volumes (MOD) LV vol d, MOD A2C: 44.9 ml LV vol d, MOD A4C: 56.2 ml LV vol s, MOD A2C: 9.0 ml LV vol s, MOD A4C: 13.8 ml LV SV MOD A2C:     35.9 ml LV SV MOD A4C:     56.2 ml LV SV MOD BP:      39.8 ml RIGHT VENTRICLE             IVC RV S prime:     20.00 cm/s  IVC diam: 1.90 cm TAPSE (M-mode): 3.2 cm LEFT ATRIUM             Index LA diam:        4.80 cm 2.77 cm/m LA Vol (A2C):   42.8 ml 24.67 ml/m LA Vol (A4C):   36.8 ml 21.21 ml/m LA Biplane Vol: 41.4 ml 23.86 ml/m  AORTIC VALVE AV Area (Vmax):    2.55 cm AV Area (Vmean):   2.25 cm AV Area (VTI):     1.91 cm AV Vmax:           212.00 cm/s AV Vmean:          150.000 cm/s AV VTI:            0.486 m AV Peak Grad:      18.0 mmHg AV Mean Grad:      10.0 mmHg LVOT Vmax:         191.00 cm/s LVOT Vmean:        119.000 cm/s LVOT VTI:           0.328 m LVOT/AV VTI ratio: 0.67 AI PHT:            393 msec  AORTA Ao Root diam: 3.10 cm Ao Asc diam:  3.60 cm MITRAL VALVE MV Area (PHT): 3.72 cm     SHUNTS MV Decel Time: 204 msec     Systemic VTI:  0.33 m MV E velocity: 98.50 cm/s   Systemic Diam: 1.90 cm MV A velocity: 104.00 cm/s MV E/A ratio:  0.95 Candee Furbish MD Electronically signed by Candee Furbish MD Signature Date/Time: 04/06/2020/3:25:34 PM    Final    CT Maxillofacial Wo Contrast  Result Date:  04/06/2020 CLINICAL DATA:  Syncopal episode. EXAM: CT MAXILLOFACIAL WITHOUT CONTRAST TECHNIQUE: Multidetector CT imaging of the maxillofacial structures was performed. Multiplanar CT image reconstructions were also generated. COMPARISON:  None. FINDINGS: Osseous: Bilateral nondisplaced nasal bone fractures of indeterminate age are seen. Orbits: Negative. No traumatic or inflammatory finding. Sinuses: There is marked severity sphenoid sinus mucosal thickening. Soft tissues: Mild to moderate severity frontal scalp soft tissue swelling is seen, along the midline. Limited intracranial: No significant or unexpected finding. IMPRESSION: 1. Bilateral nondisplaced nasal bone fractures. 2. Mild to moderate severity frontal scalp soft tissue swelling. 3. Marked severity sphenoid sinus mucosal thickening. Electronically Signed   By: Virgina Norfolk M.D.   On: 04/06/2020 02:43    Microbiology: Recent Results (from the past 240 hour(s))  SARS CORONAVIRUS 2 (TAT 6-24 HRS)     Status: None   Collection Time: 04/10/20 12:15 PM  Result Value Ref Range Status   SARS Coronavirus 2 NEGATIVE NEGATIVE Final    Comment: (NOTE) SARS-CoV-2 target nucleic acids are NOT DETECTED.  The SARS-CoV-2 RNA is generally detectable in upper and lower respiratory specimens during the acute phase of infection. Negative results do not preclude SARS-CoV-2 infection, do not rule out co-infections with other pathogens, and should not be used as the sole basis for treatment or other  patient management decisions. Negative results must be combined with clinical observations, patient history, and epidemiological information. The expected result is Negative.  Fact Sheet for Patients: SugarRoll.be  Fact Sheet for Healthcare Providers: https://www.woods-mathews.com/  This test is not yet approved or cleared by the [place] FDA and  has been authorized for detection and/or diagnosis of SARS-CoV-2 by FDA under an Emergency Use Authorization (EUA). This EUA will remain  in effect (meaning this test can be used) for the duration of the COVID-19 declaration under Se ction 564(b)(1) of the Act, 21 U.S.C. section 360bbb-3(b)(1), unless the authorization is terminated or revoked sooner.  Performed at [facility], [place] [address], [place], [place] [address]   SARS CORONAVIRUS 2 (TAT 6-24 HRS) Nasopharyngeal Nasopharyngeal Swab     Status: None   Collection Time: 04/16/20  8:56 PM   Specimen: Nasopharyngeal Swab  Result Value Ref Range Status   SARS Coronavirus 2 NEGATIVE NEGATIVE Final    Comment: (NOTE) SARS-CoV-2 target nucleic acids are NOT DETECTED.  The SARS-CoV-2 RNA is generally detectable in upper and lower respiratory specimens during the acute phase of infection. Negative results do not preclude SARS-CoV-2 infection, do not rule out co-infections with other pathogens, and should not be used as the sole basis for treatment or other patient management decisions. Negative results must be combined with clinical observations, patient history, and epidemiological information. The expected result is Negative.  Fact Sheet for Patients: SugarRoll.be  Fact Sheet for Healthcare Providers: https://www.woods-mathews.com/  This test is not yet approved or cleared by the [place] FDA and  has been authorized for detection and/or diagnosis of SARS-CoV-2 by FDA under an  Emergency Use Authorization (EUA). This EUA will remain  in effect (meaning this test can be used) for the duration of the COVID-19 declaration under Se ction 564(b)(1) of the Act, 21 U.S.C. section 360bbb-3(b)(1), unless the authorization is terminated or revoked sooner.  Performed at [facility], [place] [address] [place], [place] [address]   MRSA PCR Screening     Status: None   Collection Time: 04/17/20  2:08 AM   Specimen: Nasal Mucosa; Nasopharyngeal  Result Value Ref Range Status  MRSA by PCR NEGATIVE NEGATIVE Final    Comment:        The GeneXpert MRSA Assay (FDA approved for NASAL specimens only), is one component of a comprehensive MRSA colonization surveillance program. It is not intended to diagnose MRSA infection nor to guide or monitor treatment for MRSA infections. Performed at [facility], [place] [address] [address], [place], [place] [address]      Labs: Basic Metabolic Panel: Recent Labs  Lab 04/16/20 1731 04/17/20 0125 04/17/20 1131  NA 126* 129* 129*  K 4.4 4.7 4.3  CL 92* 95* 99  CO2 23 26 22   GLUCOSE 135* 129* 125*  BUN 26* 27* 22  CREATININE 1.12* 1.06* 1.02*  CALCIUM 9.8 8.9 8.8*  MG  --  1.8  --    Liver Function Tests: No results for input(s): AST, ALT, ALKPHOS, BILITOT, PROT, ALBUMIN in the last 168 hours. No results for input(s): LIPASE, AMYLASE in the last 168 hours. No results for input(s): AMMONIA in the last 168 hours. CBC: Recent Labs  Lab 04/16/20 1731 04/17/20 0125  WBC 9.5 9.9  HGB 13.1 12.0  HCT 40.6 35.3*  MCV 86.2 85.3  PLT 395 326   Cardiac Enzymes: No results for input(s): CKTOTAL, CKMB, CKMBINDEX, TROPONINI in the last 168 hours. BNP: BNP (last 3 results) No results for input(s): BNP in the last 8760 hours.  ProBNP (last 3 results) No results for input(s): PROBNP in the last 8760 hours.  CBG: Recent Labs  Lab 04/17/20 0611  GLUCAP 102*       Signed:  Kayleen Memos, MD [facility] 04/17/2020, 1:48 PM

## 2020-04-17 NOTE — Progress Notes (Signed)
Discharge delayed.  Discussed with patient's daughter who is concerned about her generalized weakness and recurrent syncope.  Requested a cardiology consult.  Patient has complained of worsening acid reflux.  Per her daughter [name], there was a lesion seen on her gallbladder, noted on CT scan done at outpatient facility.  Patient has a history of ovarian cancer in remission.  Will obtain CT scan abd/pelvis wo contrast to further assess.  Ms. [name] cell phone# [phone].  We will call and provide updates.

## 2020-04-17 NOTE — Consult Note (Signed)
Referring Provider: [facility] Primary Care Physician:  Hulan Fess, MD Primary Gastroenterologist: [facility] gastroenterology (formerly seen by Dr. Teena Irani)  Reason for Consultation: Abnormal CT appearance of esophagus  HPI: [name] is a 87 y.o. female admitted to the hospital yesterday because of lightheadedness and dizziness, following a recent admission roughly a week earlier for syncope.  There was a plan for discharge today, but because of worsened reflux symptoms, and concerns on the part of the patient's family which included a remote history of ovarian cancer and apparently a "lesion" seen on her gallbladder on an outside CT scan, a CT of the abdomen pelvis was arranged.  That scan, obtained earlier today, showed distal esophageal thickening but also raise the concern of a possible "trace" of inferior pneumomediastinum.  Note that the patient had had a CT of the chest just the day before, for the purpose of excluding a pulmonary embolism, and there was no mention of pneumomediastinum on that study.  Moreover, the patient has been afebrile, and she denies chest pain, back pain, shoulder pain, or pleuritic pain.  White count this morning was normal.  The patient's chief complaint is a roughly several year history of reflux symptoms, characterized by retrosternal burning induced primarily by spicy foods.  It may be that there have been occasions where she has had food stop in her esophagus and have to be regurgitated, but the details of the history are difficult to ascertain, and that certainly is not a major complaint for this patient.  The patient has used Pepcid in the past but more recently has been relying primarily on Tums, which she states she uses almost every day.  Alarm symptoms such as weight loss and dysphagia are difficult to ascertain.  The patient indicates she has not been checking her weight but is apparently not aware of any weight loss.  She has a mediocre  appetite, by her report, and some of this she attributes to the food at the [facility].  As mentioned above, there is an unclear history of possible intermittent dysphagia but it does not sound as though any persistent or progressive or significant dysphagia symptoms are present.  The patient has never had endoscopic evaluation, at least from review of our office record.  Of note, the patient's abdominal CT scan did show ventral and umbilical hernias, but the patient flatly denies any abdominal pain.   Past Medical History:  Diagnosis Date  . Anemia associated with acute blood loss 04/03/2011  . Arthritis    knees   . Ascites   . Blood transfusion    hx of 2011   . Complication of anesthesia    Sodium drops per pt   . Cystitis   . DIARRHEA, ANTIBIOTIC ASSOCIATED 05/31/2009   Qualifier: Diagnosis of  By: Tommy Medal MD, Roderic Scarce    . GERD (gastroesophageal reflux disease)   . H/O hiatal hernia   . Hyperlipidemia   . Hypertension   . Hyponatremia   . Neutropenia with fever ([place]) 05/18/2011  . OSTEOMYELITIS, CHRONIC, LOWER LEG 04/23/2009   Qualifier: Diagnosis of  By: Johnnye Sima MD, Dellis Filbert    . Osteopenia   . OSTEOPOROSIS 04/23/2009   Qualifier: Diagnosis of  By: Johnnye Sima MD, Dellis Filbert    . Ovarian cancer ([place]) 01/25/2011  . Pneumonia    hx of   . Postmenopausal atrophic vaginitis   . PROSTHETIC JOINT COMPLICATION 0/11/9321   Qualifier: Diagnosis of  By: Tommy Medal MD, Roderic Scarce    .  Renal disorder    Decreased kidney function  . Staph infection 2010   after knee replacement  . Urinary frequency   . Vulvitis     Past Surgical History:  Procedure Laterality Date  . ABDOMINAL HYSTERECTOMY  04/01/2011   Procedure: HYSTERECTOMY ABDOMINAL;  Surgeon: Alvino Chapel, MD;  Location: [facility];  Service: Gynecology;  Laterality: N/A;  . APPENDECTOMY    . JOINT REPLACEMENT     R knee in 2008, 5 operations on L knee  . LAPAROTOMY  04/01/2011   Procedure: EXPLORATORY  LAPAROTOMY;  Surgeon: Alvino Chapel, MD;  Location: [facility];  Service: Gynecology;  Laterality: N/A;  . OTHER SURGICAL HISTORY     hx of C section 1966  . SALPINGOOPHORECTOMY  04/01/2011   Procedure: SALPINGO OOPHERECTOMY;  Surgeon: Alvino Chapel, MD;  Location: [facility];  Service: Gynecology;  Laterality: Bilateral;  . TONSILLECTOMY      Prior to Admission medications   Medication Sig Start Date End Date Taking? Authorizing Provider  acetaminophen (TYLENOL) 500 MG tablet Take 2 tablets (1,000 mg total) by mouth 3 (three) times daily. 04/11/20  Yes Vann, Jessica U, DO  azelastine (ASTELIN) 0.1 % nasal spray Place 1 spray into both nostrils at bedtime as needed for rhinitis or allergies. 12/06/15  Yes [provider]  B Complex Vitamins (VITAMIN B COMPLEX PO) Take 1 tablet by mouth daily.    Yes [provider]  calcium carbonate (TUMS EX) 750 MG chewable tablet Chew 750 mg by mouth daily as needed for heartburn.   Yes [provider]  Cholecalciferol (VITAMIN D) 2000 UNITS tablet Take 2,000 Units by mouth daily.   Yes [provider]  diclofenac Sodium (VOLTAREN) 1 % GEL Apply 2 g topically 4 (four) times daily. Patient taking differently: Apply 2 g topically 4 (four) times daily. Right ankle 04/11/20  Yes Eulogio Bear U, DO  docusate sodium (COLACE) 100 MG capsule Take 100 mg by mouth at bedtime as needed for mild constipation.   Yes [provider]  lisinopril (ZESTRIL) 20 MG tablet Take 20 mg by mouth daily. 10/18/18  Yes [provider]  meclizine (ANTIVERT) 12.5 MG tablet Take 1 tablet (12.5 mg total) by mouth 2 (two) times daily as needed for dizziness. 04/11/20  Yes Vann, Jessica U, DO  melatonin 3 MG TABS tablet Take 1 tablet (3 mg total) by mouth at bedtime. 04/11/20  Yes Geradine Girt, DO  Multiple Vitamins-Minerals (CENTRUM ADULTS PO) Take 1 tablet by mouth daily.   Yes [provider]  RESTASIS 0.05 % ophthalmic  emulsion Place 1 drop into both eyes in the morning and at bedtime. 09/02/19  Yes [provider]  zinc oxide 20 % ointment Apply 1 application topically as needed for irritation.   Yes [provider]  pantoprazole (PROTONIX) 40 MG tablet Take 1 tablet (40 mg total) by mouth daily. 04/17/20 05/17/20  Kayleen Memos, DO  sodium chloride 1 g tablet Take 1 tablet (1 g total) by mouth 2 (two) times daily with a meal for 10 days. 04/17/20 04/27/20  Kayleen Memos, DO    Current Facility-Administered Medications  Medication Dose Route Frequency Provider Last Rate Last Admin  . 0.9 %  sodium chloride infusion   Intravenous Continuous Kayleen Memos, DO 50 mL/hr at 04/17/20 1653 Restarted at 04/17/20 1653  . acetaminophen (TYLENOL) tablet 650 mg  650 mg Oral Q6H PRN Lenore Cordia, MD  Or  . acetaminophen (TYLENOL) suppository 650 mg  650 mg Rectal Q6H PRN Lenore Cordia, MD      . calcium carbonate (TUMS - dosed in mg elemental calcium) chewable tablet 200 mg of elemental calcium  1 tablet Oral PRN Irene Pap N, DO   200 mg of elemental calcium at 04/17/20 1356  . enoxaparin (LOVENOX) injection 40 mg  40 mg Subcutaneous Q24H Einar Grad, [facility]      . iopamidol (ISOVUE-300) 61 % injection 50 mL  50 mL Oral Once PRN Irene Pap N, DO      . melatonin tablet 3 mg  3 mg Oral QHS Zada Finders R, MD   3 mg at 04/17/20 0000  . pantoprazole (PROTONIX) EC tablet 40 mg  40 mg Oral BID Lenore Cordia, MD   40 mg at 04/17/20 0908  . sodium chloride flush (NS) 0.9 % injection 3 mL  3 mL Intravenous Q12H Zada Finders R, MD   3 mL at 04/17/20 1357  . sodium chloride tablet 1 g  1 g Oral BID WC Irene Pap N, DO        Allergies as of 04/16/2020 - Review Complete 04/16/2020  Allergen Reaction Noted  . Morphine and related Other (See Comments) 01/15/2011  . Penicillins Hives and Itching 04/23/2009  . Nsaids Other (See Comments) 02/01/2020  . Shellfish allergy Nausea And Vomiting and  Other (See Comments) 01/09/2011    Family History  Problem Relation Age of Onset  . Cancer Other        Bladder cancer  . Heart attack Brother   . Diabetes Brother     Social History   Socioeconomic History  . Marital status: Widowed    Spouse name: Not on file  . Number of children: Not on file  . Years of education: Not on file  . Highest education level: Not on file  Occupational History  . Not on file  Tobacco Use  . Smoking status: Former Smoker    Quit date: 02/11/1952    Years since quitting: 68.2  . Smokeless tobacco: Never Used  Vaping Use  . Vaping Use: Never used  Substance and Sexual Activity  . Alcohol use: Not Currently  . Drug use: Never  . Sexual activity: Never  Other Topics Concern  . Not on file  Social History Narrative  . Not on file   Social Determinants of Health   Financial Resource Strain: Not on file  Food Insecurity: Not on file  Transportation Needs: Not on file  Physical Activity: Not on file  Stress: Not on file  Social Connections: Not on file  Intimate Partner Violence: Not on file    Review of Systems: Please see history of present illness  Physical Exam: Vital signs in last 24 hours: Temp:  [97.4 F (36.3 C)-98.6 F (37 C)] 97.9 F (36.6 C) (03/08 1614) Pulse Rate:  [76-99] 89 (03/08 1614) Resp:  [14-22] 20 (03/08 1614) BP: (85-154)/(49-75) 124/69 (03/08 1614) SpO2:  [90 %-98 %] 98 % (03/08 1614) Weight:  [75.9 kg] 75.9 kg (03/08 0100)   General:   Alert,  Well-developed, well-nourished, pleasant and cooperative in NAD, quite well-preserved, appearing somewhat younger than her advanced age. Head:  Normocephalic and atraumatic. Eyes:  Sclera clear, no icterus.   Conjunctiva pink. Mouth:   No ulcerations or lesions.  Oropharynx pink & moist. Neck:   No masses or thyromegaly. Lungs:  Clear throughout to auscultation.   No wheezes,  crackles, or rhonchi. No evident respiratory distress. Heart:   Regular rate and rhythm; no  murmurs, clicks, rubs,  or gallops. Abdomen:  Soft, nontender, no masses.  Readily reducible ventral hernias noted, umbilicus with small hernia deviated toward the right side of the abdomen.   Msk:   Symmetrical without gross deformities. Extremities:   Without clubbing, cyanosis, or edema. Neurologic:  Alert and coherent;  grossly normal neurologically.  She seems very sharp for her age, just slightly hard of hearing. Skin:  Intact without significant lesions or rashes. Cervical Nodes:  No significant cervical adenopathy. Psych:   Alert and cooperative. Normal mood and affect.  Intake/Output from previous day: 03/07 0701 - 03/08 0700 In: 1469 [I.V.:469; IV Piggyback:1000] Out: -  Intake/Output this shift: No intake/output data recorded.  Lab Results: Recent Labs    04/16/20 1731 04/17/20 0125  WBC 9.5 9.9  HGB 13.1 12.0  HCT 40.6 35.3*  PLT 395 326   BMET Recent Labs    04/16/20 1731 04/17/20 0125 04/17/20 1131  NA 126* 129* 129*  K 4.4 4.7 4.3  CL 92* 95* 99  CO2 23 26 22   GLUCOSE 135* 129* 125*  BUN 26* 27* 22  CREATININE 1.12* 1.06* 1.02*  CALCIUM 9.8 8.9 8.8*   LFT No results for input(s): PROT, ALBUMIN, AST, ALT, ALKPHOS, BILITOT, BILIDIR, IBILI in the last 72 hours. PT/INR No results for input(s): LABPROT, INR in the last 72 hours.  Studies/Results: CT ABDOMEN PELVIS WO CONTRAST  Addendum Date: 04/17/2020   ADDENDUM REPORT: 04/17/2020 16:01 ADDENDUM: Findings should include old healed right ninth and tenth rib fractures. Electronically Signed   By: Iven Finn M.D.   On: 04/17/2020 16:01   Result Date: 04/17/2020 CLINICAL DATA:  Nonlocalized acute abdominal pain. EXAM: CT ABDOMEN AND PELVIS WITHOUT CONTRAST TECHNIQUE: Multidetector CT imaging of the abdomen and pelvis was performed following the standard protocol without IV contrast. COMPARISON:  CT angiography chest 04/16/2020, CT abdomen pelvis 03/05/2011 FINDINGS: Lower chest: Persistent trace right  pleural effusion. Bilateral lower lobe subsegmental atelectasis. At least small volume hiatal hernia with persistent distal esophageal thickening. Medial left base atelectasis (6:60); however, a component of trace inferior pneumomediastinum cannot be excluded (3: 4-11). Hepatobiliary: No focal liver abnormality. The gallbladder is contracted. Punctate calcific density within the gallbladder lumen likely represents a gallstone. No gallbladder wall thickening or pericholecystic fluid. No biliary dilatation. Pancreas: No focal lesion. Normal pancreatic contour. No surrounding inflammatory changes. No main pancreatic ductal dilatation. Spleen: Normal in size without focal abnormality. Adrenals/Urinary Tract: No adrenal nodule bilaterally. Punctate calcific densities within bilateral kidneys. No hydronephrosis and no contour-deforming renal mass. No ureterolithiasis or hydroureter. Fluid fluid level within the urinary bladder likely related to previously administered intravenous contrast for CT angiography chest 04/16/2020. Stomach/Bowel: Stomach is within normal limits. No evidence of bowel wall thickening or dilatation. Diffuse colonic diverticulosis. No pneumatosis. The appendix not definitely identified. Vascular/Lymphatic: Several punctate phleboliths are noted within the pelvis. Focal dilatation of the infrarenal abdominal aorta measuring up to 2.8 cm in diameter with no definite abdominal aorta or iliac aneurysm. Moderate to severe atherosclerotic plaque of the aorta and its branches. No abdominal, pelvic, or inguinal lymphadenopathy. Reproductive: Status post hysterectomy. No adnexal masses. Other: No intraperitoneal free fluid. No intraperitoneal free gas. No organized fluid collection. Musculoskeletal: Two large consecutive anterior abdominal wall hernias with the likely paraumbilical hernia containing a long segment of the mid to distal transverse colon with an abdominal defect of 5.8 x 4.5  cm. The  inferior-most hernia likely a infraumbilical hernia contains several loops of small bowel and demonstrates a abdominal defect of approximately 5.8 x 3.7 cm. Grade 1 anterolisthesis of L4 on L5. Multilevel facet arthropathy. Visualized lower thoracic spine demonstrates multilevel osteophyte formation. No suspicious lytic or blastic osseous lesions. No acute displaced fracture. IMPRESSION: 1. Question trace inferior pneumomediastinum. In the setting of a small hiatal hernia and distal esophageal thickening, a small or contained esophageal perforation cannot be excluded. 2. Two large consecutive anterior abdominal wall hernias, one of which is likely paraumbilical, containing small and large bowel. No definite findings of ischemia or associated bowel obstruction with however limited evaluation due to venous noncontrast study. 3. Colonic diverticulosis with no acute diverticulitis. 4. Nonobstructive punctate nephrolithiasis bilaterally. 5. Aortic Atherosclerosis (ICD10-I70.0). These results were called by telephone at the time of interpretation on 04/17/2020 at 3:45 pm to provider [facility] , who verbally acknowledged these results. Electronically Signed: By: Iven Finn M.D. On: 04/17/2020 15:52   CT Angio Chest PE W and/or Wo Contrast  Result Date: 04/16/2020 CLINICAL DATA:  Chest pain, history of prior fall, initial encounter EXAM: CT ANGIOGRAPHY CHEST WITH CONTRAST TECHNIQUE: Multidetector CT imaging of the chest was performed using the standard protocol during bolus administration of intravenous contrast. Multiplanar CT image reconstructions and MIPs were obtained to evaluate the vascular anatomy. CONTRAST:  70mL OMNIPAQUE IOHEXOL 350 MG/ML SOLN COMPARISON:  Chest x-ray from earlier in the same day. FINDINGS: Cardiovascular: Thoracic aorta demonstrates atherosclerotic calcifications without aneurysmal dilatation. The degree of aortic enhancement is not sufficient to evaluate for dissection. No cardiac  enlargement is seen. Diffuse coronary calcifications are noted. The pulmonary artery shows a normal branching pattern. No filling defect to suggest pulmonary embolism is noted. Mediastinum/Nodes: Thoracic inlet is within normal limits. No sizable hilar or mediastinal adenopathy is noted. Esophageal thickening is noted distally likely related to reflux. Hiatal hernia is seen. Lungs/Pleura: Mild bibasilar atelectatic changes are seen. Small right pleural effusion is noted. No sizable infiltrate is seen. No sizable parenchymal nodules are noted. Upper Abdomen: Visualized upper abdomen is otherwise within normal limits. Musculoskeletal: Degenerative changes of the thoracic spine are seen. No acute rib abnormality is seen. Review of the MIP images confirms the above findings. IMPRESSION: No evidence of pulmonary emboli. Small right pleural effusion and bibasilar atelectatic changes. Hiatal hernia with distal esophageal thickening likely related to reflux. Aortic Atherosclerosis (ICD10-I70.0). Electronically Signed   By: Inez Catalina M.D.   On: 04/16/2020 19:55   DG Chest Portable 1 View  Result Date: 04/16/2020 CLINICAL DATA:  Recent fall, chest pain EXAM: PORTABLE CHEST 1 VIEW COMPARISON:  08/04/2019 FINDINGS: Single frontal view of the chest demonstrates an unremarkable cardiac silhouette. No airspace disease, effusion, or pneumothorax. No acute bony abnormalities. IMPRESSION: 1. No acute intrathoracic process. Electronically Signed   By: Randa Ngo M.D.   On: 04/16/2020 18:49    Impression: 1.  Intensified reflux symptoms over the past year 2.  Esophageal thickening noted on chest CT and abdominal CT 3.  Question raised of possible minimal pneumomediastinum on today's chest CT.  No clinical evidence to suggest that pneumomediastinum is present, based on symptoms, white count, vital signs, clinical appearance, or yesterday's chest CT.  Plan: 1.  Agree with plan for barium swallow today. 2.  Agree with  high-dose PPI therapy for reflux symptoms 3.  Consider possible endoscopic evaluation depending on the radiographic findings on her barium swallow, and or if she has an inadequate  response to high-dose PPI therapy   LOS: 0 days   Youlanda Mighty Helaina Stefano  04/17/2020, 6:04 PM   Pager [phone] If no answer or after 5 PM call [phone]

## 2020-04-17 NOTE — Progress Notes (Signed)
Patient arrived from ED 0030.  Alert and oriented. VSS. No c/o pain. No wounds or skin issues noted. Report received from [place], [place]. Will continue to monitor.

## 2020-04-17 NOTE — Progress Notes (Signed)
OT Cancellation Note  Patient Details Name: [name] MRN: [id number] DOB: [birth year]   Cancelled Treatment:    Reason Eval/Treat Not Completed: Patient declined, no reason specified (Pt stating "I really don't feel like doing that right now. How about tomorrow?" OTR explaining benefits of movement beyond getting on/off BSC and to check orthostatic vitals. Pt continued to refuse OT visit today. Pt had been up with physical therapy.)   ** Pt was resistant to OT during last hospital admission. We will continue to follow-up for OT eval.  Jefferey Pica, OTR/L Acute Rehabilitation Services Pager: [phone] Office: [phone]   ALLISON C 04/17/2020, 11:25 AM

## 2020-04-17 NOTE — Consult Note (Addendum)
Cardiology Consultation:   Patient ID: DESHONDRA WORST MRN: [id number]; DOB: December 11, 1929  Admit date: 04/16/2020 Date of Consult: 04/17/2020  PCP:  Hulan Fess, MD   [place]  Cardiologist:  Donato Heinz, MD  Advanced Practice Provider:  No care team member to display Electrophysiologist:  None  [id number]}   Patient Profile:   [name] is a 87 y.o. female with a hx of remote ovarian CA, mild aortic stenosis, HTN, HLD, anemia, hiatal hernia, prior prosthetic joint infection, chronic appearing hyponatremia who is being seen today for the evaluation of chest pain and syncope at the request of Dr. [place] Crane.  At time of this evaluation I am also informed by nurse that there is concern for esophageal perforation and GI is being consulted by IM as well.  History of Present Illness:   [name] previously saw Dr. Gardiner Rhyme for a heart murmur found to have insignificant mild aortic stenosis with plan for clinical surveillance. She was recently admitted 2/25-04/11/20 with syncope felt due to orthostasis or micturitional syncope. Orthostatic VS were negative although does appear she had occasional soft readings during admission (82/50, 102/70). BP appeared labile.  2D echo was reassuring showing EF 70-75%, grade 1 DD, mild AI/MR. Ambien, Norvasc, and Nadolol were discontinued and lisinopril were continued. She was discharged back to SNF. While staff were assisting her at [facility] to the bathroom yesterday she apparently became lightheaded and dizzy. They sat her on the commode and she went temporarily unresponsive. The patient does not remember acute details but does remember feeling lightheaded/dizzy when changing positions. It does not appear she required intervention to bring her to. Her blood pressure was reportedly 150s/99 on follow-up. Shortly after this event she developed midsternal chest "squeezing" without any radiation. No SOB, n/v, or diaphoresis that  she can recall. She has struggled with hiatal hernia over the years so she is no stranger to GERD symptoms, but had not had chest discomfort like this before that she can recall. She is very careful not to refer to it as pain so much as the squeezing sensation. On arrival the patient was tachycardic and hypotensive at 66/52. CTA showed no evidence of PE, + right pleural effusion and bibasilar atelectatic changes, hiatal hernia with distal esophageal thickening likely related to reflux. Labs were c/w slight worsening of chronic hyponatremia and AKI. She was treated with IV fluids and lisinopril was held. Orthostatics were positive as well. TED hose was recommended. Given her chest pain, troponins were obtained which were low and flat at 20->18. She is not certain how long exactly her chest pain lasted but she states it improved soon after arrival to the ED so question coinciding of treatment of hypotension. She generally tries to stay active at [facility] attending meals and the beauty shop and denies any antecedent angina before this episode. Patient's blood pressure had improved and she was slated for discharge today. However per IM note, "Discharge delayed.  Discussed with patient's daughter who is concerned about her generalized weakness and recurrent syncope.  Requested a cardiology consult.  Patient has complained of worsening acid reflux.  Per her daughter [name], there was a lesion seen on her gallbladder, noted on CT scan done at outpatient facility.  Patient has a history of ovarian cancer in remission.  Will obtain CT scan abd/pelvis wo contrast to further assess." CT scan showed possible pneumomediastinum cannot exclude small esophageal performation. As above, GI is to see per nurse and she  is now NPO. She feels well and denies any acute complaint currently including no chest pain.   Past Medical History:  Diagnosis Date  . Anemia associated with acute blood loss 04/03/2011  . Arthritis    knees    . Ascites   . Blood transfusion    hx of 2011   . Complication of anesthesia    Sodium drops per pt   . Cystitis   . DIARRHEA, ANTIBIOTIC ASSOCIATED 05/31/2009   Qualifier: Diagnosis of  By: Tommy Medal MD, Roderic Scarce    . GERD (gastroesophageal reflux disease)   . H/O hiatal hernia   . Hyperlipidemia   . Hypertension   . Hyponatremia   . Neutropenia with fever ([place]) 05/18/2011  . OSTEOMYELITIS, CHRONIC, LOWER LEG 04/23/2009   Qualifier: Diagnosis of  By: Johnnye Sima MD, Dellis Filbert    . Osteopenia   . OSTEOPOROSIS 04/23/2009   Qualifier: Diagnosis of  By: Johnnye Sima MD, Dellis Filbert    . Ovarian cancer ([place]) 01/25/2011  . Pneumonia    hx of   . Postmenopausal atrophic vaginitis   . PROSTHETIC JOINT COMPLICATION 1/61/0960   Qualifier: Diagnosis of  By: Tommy Medal MD, Roderic Scarce    . Renal disorder    Decreased kidney function  . Staph infection 2010   after knee replacement  . Urinary frequency   . Vulvitis     Past Surgical History:  Procedure Laterality Date  . ABDOMINAL HYSTERECTOMY  04/01/2011   Procedure: HYSTERECTOMY ABDOMINAL;  Surgeon: Alvino Chapel, MD;  Location: [facility];  Service: Gynecology;  Laterality: N/A;  . APPENDECTOMY    . JOINT REPLACEMENT     R knee in 2008, 5 operations on L knee  . LAPAROTOMY  04/01/2011   Procedure: EXPLORATORY LAPAROTOMY;  Surgeon: Alvino Chapel, MD;  Location: [facility];  Service: Gynecology;  Laterality: N/A;  . OTHER SURGICAL HISTORY     hx of C section 1966  . SALPINGOOPHORECTOMY  04/01/2011   Procedure: SALPINGO OOPHERECTOMY;  Surgeon: Alvino Chapel, MD;  Location: [facility];  Service: Gynecology;  Laterality: Bilateral;  . TONSILLECTOMY       Home Medications:  Prior to Admission medications   Medication Sig Start Date End Date Taking? Authorizing Provider  acetaminophen (TYLENOL) 500 MG tablet Take 2 tablets (1,000 mg total) by mouth 3 (three) times daily. 04/11/20  Yes Vann, Jessica U, DO  azelastine (ASTELIN) 0.1 % nasal  spray Place 1 spray into both nostrils at bedtime as needed for rhinitis or allergies. 12/06/15  Yes [provider]  B Complex Vitamins (VITAMIN B COMPLEX PO) Take 1 tablet by mouth daily.    Yes [provider]  calcium carbonate (TUMS EX) 750 MG chewable tablet Chew 750 mg by mouth daily as needed for heartburn.   Yes [provider]  Cholecalciferol (VITAMIN D) 2000 UNITS tablet Take 2,000 Units by mouth daily.   Yes [provider]  diclofenac Sodium (VOLTAREN) 1 % GEL Apply 2 g topically 4 (four) times daily. Patient taking differently: Apply 2 g topically 4 (four) times daily. Right ankle 04/11/20  Yes Eulogio Bear U, DO  docusate sodium (COLACE) 100 MG capsule Take 100 mg by mouth at bedtime as needed for mild constipation.   Yes [provider]  lisinopril (ZESTRIL) 20 MG tablet Take 20 mg by mouth daily. 10/18/18  Yes [provider]  meclizine (ANTIVERT) 12.5 MG tablet Take 1 tablet (12.5 mg total) by mouth 2 (two) times daily as  needed for dizziness. 04/11/20  Yes Vann, Jessica U, DO  melatonin 3 MG TABS tablet Take 1 tablet (3 mg total) by mouth at bedtime. 04/11/20  Yes Geradine Girt, DO  Multiple Vitamins-Minerals (CENTRUM ADULTS PO) Take 1 tablet by mouth daily.   Yes [provider]  RESTASIS 0.05 % ophthalmic emulsion Place 1 drop into both eyes in the morning and at bedtime. 09/02/19  Yes [provider]  zinc oxide 20 % ointment Apply 1 application topically as needed for irritation.   Yes [provider]  pantoprazole (PROTONIX) 40 MG tablet Take 1 tablet (40 mg total) by mouth daily. 04/17/20 05/17/20  Kayleen Memos, DO  sodium chloride 1 g tablet Take 1 tablet (1 g total) by mouth 2 (two) times daily with a meal for 10 days. 04/17/20 04/27/20  Kayleen Memos, DO    Inpatient Medications: Scheduled Meds: . enoxaparin (LOVENOX) injection  40 mg Subcutaneous Q24H  . melatonin  3 mg Oral QHS  . pantoprazole   40 mg Oral BID  . sodium chloride flush  3 mL Intravenous Q12H  . sodium chloride  1 g Oral BID WC   Continuous Infusions: . sodium chloride 50 mL/hr at 04/17/20 1653   PRN Meds: acetaminophen **OR** acetaminophen, calcium carbonate  Allergies:    Allergies  Allergen Reactions  . Morphine And Related Other (See Comments)    Given after knee replacement and code blue occurred  . Penicillins Hives and Itching    syncope  . Nsaids Other (See Comments)    abn kidney test  . Shellfish Allergy Nausea And Vomiting and Other (See Comments)    Pt has shellfish allergy only.  Has had IV contrast x 2 and did fine.    Social History:   Social History   Socioeconomic History  . Marital status: Widowed    Spouse name: Not on file  . Number of children: Not on file  . Years of education: Not on file  . Highest education level: Not on file  Occupational History  . Not on file  Tobacco Use  . Smoking status: Former Smoker    Quit date: 02/11/1952    Years since quitting: 68.2  . Smokeless tobacco: Never Used  Vaping Use  . Vaping Use: Never used  Substance and Sexual Activity  . Alcohol use: Not Currently  . Drug use: Never  . Sexual activity: Never  Other Topics Concern  . Not on file  Social History Narrative  . Not on file   Social Determinants of Health   Financial Resource Strain: Not on file  Food Insecurity: Not on file  Transportation Needs: Not on file  Physical Activity: Not on file  Stress: Not on file  Social Connections: Not on file  Intimate Partner Violence: Not on file    Family History:   Family History  Problem Relation Age of Onset  . Cancer Other        Bladder cancer  . Heart attack Brother   . Diabetes Brother      ROS:  Please see the history of present illness.  All other ROS reviewed and negative.     Physical Exam/Data:   Vitals:   04/17/20 0600 04/17/20 0700 04/17/20 0800 04/17/20 1614  BP: (!) 146/75 (!) 107/59 126/72 124/69   Pulse: 86 76 84 89  Resp: 17 19 16 20   Temp:  98.3 F (36.8 C) 98.3 F (36.8 C) 97.9 F (36.6 C)  TempSrc:  Oral Oral Oral  SpO2: 96% 97% 95% 98%  Weight:      Height:        Intake/Output Summary (Last 24 hours) at 04/17/2020 1728 Last data filed at 04/17/2020 0400 Gross per 24 hour  Intake 1469.01 ml  Output --  Net 1469.01 ml   Last 3 Weights 04/17/2020 04/12/2020 04/11/2020  Weight (lbs) 167 lb 5.3 oz 167 lb 8 oz 167 lb 8.8 oz  Weight (kg) 75.9 kg 75.978 kg 76 kg     Body mass index is 29.64 kg/m.  Vital Signs. BP 124/69 (BP Location: Right Arm)   Pulse 89   Temp 97.9 F (36.6 C) (Oral)   Resp 20   Ht 5\' 3"  (1.6 m)   Wt 75.9 kg   SpO2 98%   BMI 29.64 kg/m  General: Well developed, well nourished elderly WF, in no acute distress. Head: Normocephalic, sclera non-icteric, no xanthomas, nares are without discharge. Mild facial bruising noted Neck: Negative for carotid bruits. JVP not elevated. Lungs: Clear bilaterally to auscultation without wheezes, rales, or rhonchi. Breathing is unlabored. Heart: RRR S1 S2, soft SEM RUSB, no rubs or gallops.  Abdomen: Soft, non-tender, non-distended with normoactive bowel sounds. No rebound/guarding. Extremities: No clubbing or cyanosis. No edema. Distal pedal pulses are 2+ and equal bilaterally. Neuro: Alert and oriented X 3. Moves all extremities spontaneously. Psych:  Responds to questions appropriately with a normal affect.  EKG:  The EKG was personally reviewed and demonstrates:   Multiple tracings reviewed, last of which was NSR   Telemetry:  Telemetry was personally reviewed and demonstrates: NSR, possible prior inferior infarct, no acute STT changes Previous tracing with nonspecific STTW changes but in the context of limb lead reversal so not useful  Relevant CV Studies: 2d echo 04/06/20 1. Left ventricular ejection fraction, by estimation, is 70 to 75%. The  left ventricle has hyperdynamic function. The left ventricle has no   regional wall motion abnormalities. There is mild left ventricular  hypertrophy. Left ventricular diastolic  parameters are consistent with Grade I diastolic dysfunction (impaired  relaxation).  2. Right ventricular systolic function is normal. The right ventricular  size is normal.  3. The mitral valve is normal in structure. No evidence of mitral valve  regurgitation. No evidence of mitral stenosis.  4. The aortic valve is tricuspid. Aortic valve regurgitation is mild.  Mild aortic valve stenosis. Aortic valve area, by VTI measures 1.91 cm.  Aortic valve mean gradient measures 10.0 mmHg. Aortic valve Vmax measures  2.12 m/s.  5. The inferior vena cava is normal in size with greater than 50%  respiratory variability, suggesting right atrial pressure of 3 mmHg.   Laboratory Data:  High Sensitivity Troponin:   Recent Labs  Lab 04/06/20 0207 04/06/20 0526 04/16/20 1731 04/16/20 2110  TROPONINIHS 8 5 20* 18*     Chemistry Recent Labs  Lab 04/16/20 1731 04/17/20 0125 04/17/20 1131  NA 126* 129* 129*  K 4.4 4.7 4.3  CL 92* 95* 99  CO2 23 26 22   GLUCOSE 135* 129* 125*  BUN 26* 27* 22  CREATININE 1.12* 1.06* 1.02*  CALCIUM 9.8 8.9 8.8*  GFRNONAA 47* 51* 53*  ANIONGAP 11 8 8     No results for input(s): PROT, ALBUMIN, AST, ALT, ALKPHOS, BILITOT in the last 168 hours. Hematology Recent Labs  Lab 04/16/20 1731 04/17/20 0125  WBC 9.5 9.9  RBC 4.71 4.14  HGB 13.1 12.0  HCT 40.6 35.3*  MCV 86.2  85.3  MCH 27.8 29.0  MCHC 32.3 34.0  RDW 15.4 15.6*  PLT 395 326   BNPNo results for input(s): BNP, PROBNP in the last 168 hours.  DDimer No results for input(s): DDIMER in the last 168 hours.   Radiology/Studies:  CT ABDOMEN PELVIS WO CONTRAST  Addendum Date: 04/17/2020   ADDENDUM REPORT: 04/17/2020 16:01 ADDENDUM: Findings should include old healed right ninth and tenth rib fractures. Electronically Signed   By: Iven Finn M.D.   On: 04/17/2020 16:01   Result  Date: 04/17/2020 CLINICAL DATA:  Nonlocalized acute abdominal pain. EXAM: CT ABDOMEN AND PELVIS WITHOUT CONTRAST TECHNIQUE: Multidetector CT imaging of the abdomen and pelvis was performed following the standard protocol without IV contrast. COMPARISON:  CT angiography chest 04/16/2020, CT abdomen pelvis 03/05/2011 FINDINGS: Lower chest: Persistent trace right pleural effusion. Bilateral lower lobe subsegmental atelectasis. At least small volume hiatal hernia with persistent distal esophageal thickening. Medial left base atelectasis (6:60); however, a component of trace inferior pneumomediastinum cannot be excluded (3: 4-11). Hepatobiliary: No focal liver abnormality. The gallbladder is contracted. Punctate calcific density within the gallbladder lumen likely represents a gallstone. No gallbladder wall thickening or pericholecystic fluid. No biliary dilatation. Pancreas: No focal lesion. Normal pancreatic contour. No surrounding inflammatory changes. No main pancreatic ductal dilatation. Spleen: Normal in size without focal abnormality. Adrenals/Urinary Tract: No adrenal nodule bilaterally. Punctate calcific densities within bilateral kidneys. No hydronephrosis and no contour-deforming renal mass. No ureterolithiasis or hydroureter. Fluid fluid level within the urinary bladder likely related to previously administered intravenous contrast for CT angiography chest 04/16/2020. Stomach/Bowel: Stomach is within normal limits. No evidence of bowel wall thickening or dilatation. Diffuse colonic diverticulosis. No pneumatosis. The appendix not definitely identified. Vascular/Lymphatic: Several punctate phleboliths are noted within the pelvis. Focal dilatation of the infrarenal abdominal aorta measuring up to 2.8 cm in diameter with no definite abdominal aorta or iliac aneurysm. Moderate to severe atherosclerotic plaque of the aorta and its branches. No abdominal, pelvic, or inguinal lymphadenopathy. Reproductive: Status  post hysterectomy. No adnexal masses. Other: No intraperitoneal [name] fluid. No intraperitoneal [name] gas. No organized fluid collection. Musculoskeletal: Two large consecutive anterior abdominal wall hernias with the likely paraumbilical hernia containing a long segment of the mid to distal transverse colon with an abdominal defect of 5.8 x 4.5 cm. The inferior-most hernia likely a infraumbilical hernia contains several loops of small bowel and demonstrates a abdominal defect of approximately 5.8 x 3.7 cm. Grade 1 anterolisthesis of L4 on L5. Multilevel facet arthropathy. Visualized lower thoracic spine demonstrates multilevel osteophyte formation. No suspicious lytic or blastic osseous lesions. No acute displaced fracture. IMPRESSION: 1. Question trace inferior pneumomediastinum. In the setting of a small hiatal hernia and distal esophageal thickening, a small or contained esophageal perforation cannot be excluded. 2. Two large consecutive anterior abdominal wall hernias, one of which is likely paraumbilical, containing small and large bowel. No definite findings of ischemia or associated bowel obstruction with however limited evaluation due to venous noncontrast study. 3. Colonic diverticulosis with no acute diverticulitis. 4. Nonobstructive punctate nephrolithiasis bilaterally. 5. Aortic Atherosclerosis (ICD10-I70.0). These results were called by telephone at the time of interpretation on 04/17/2020 at 3:45 pm to provider [facility] , who verbally acknowledged these results. Electronically Signed: By: Iven Finn M.D. On: 04/17/2020 15:52   CT Angio Chest PE W and/or Wo Contrast  Result Date: 04/16/2020 CLINICAL DATA:  Chest pain, history of prior fall, initial encounter EXAM: CT ANGIOGRAPHY CHEST WITH CONTRAST TECHNIQUE: Multidetector CT  imaging of the chest was performed using the standard protocol during bolus administration of intravenous contrast. Multiplanar CT image reconstructions and MIPs were  obtained to evaluate the vascular anatomy. CONTRAST:  3mL OMNIPAQUE IOHEXOL 350 MG/ML SOLN COMPARISON:  Chest x-ray from earlier in the same day. FINDINGS: Cardiovascular: Thoracic aorta demonstrates atherosclerotic calcifications without aneurysmal dilatation. The degree of aortic enhancement is not sufficient to evaluate for dissection. No cardiac enlargement is seen. Diffuse coronary calcifications are noted. The pulmonary artery shows a normal branching pattern. No filling defect to suggest pulmonary embolism is noted. Mediastinum/Nodes: Thoracic inlet is within normal limits. No sizable hilar or mediastinal adenopathy is noted. Esophageal thickening is noted distally likely related to reflux. Hiatal hernia is seen. Lungs/Pleura: Mild bibasilar atelectatic changes are seen. Small right pleural effusion is noted. No sizable infiltrate is seen. No sizable parenchymal nodules are noted. Upper Abdomen: Visualized upper abdomen is otherwise within normal limits. Musculoskeletal: Degenerative changes of the thoracic spine are seen. No acute rib abnormality is seen. Review of the MIP images confirms the above findings. IMPRESSION: No evidence of pulmonary emboli. Small right pleural effusion and bibasilar atelectatic changes. Hiatal hernia with distal esophageal thickening likely related to reflux. Aortic Atherosclerosis (ICD10-I70.0). Electronically Signed   By: Inez Catalina M.D.   On: 04/16/2020 19:55   DG Chest Portable 1 View  Result Date: 04/16/2020 CLINICAL DATA:  Recent fall, chest pain EXAM: PORTABLE CHEST 1 VIEW COMPARISON:  08/04/2019 FINDINGS: Single frontal view of the chest demonstrates an unremarkable cardiac silhouette. No airspace disease, effusion, or pneumothorax. No acute bony abnormalities. IMPRESSION: 1. No acute intrathoracic process. Electronically Signed   By: Randa Ngo M.D.   On: 04/16/2020 18:49     Assessment and Plan:   1. Chest pain - suspect related to hypotension, resolved  with treatment of low blood pressure - no further recurrence since admission - hsTroponin low/flat which is reassuring arguing against an acute coronary syndrome - recent echocardiogram also recently showed normal LV function - question relationship to esophageal issues ongoing - will review with MD  2. Recurrent syncope - appears to be occurring in the context of episodic soft blood pressure - blood pressure improved with discontinuation of antihypertensives, TED hose, also starting salt tablets per [facility] in context of hyponatremia - if low BP recurs, can consider midodrine - 2D echo reassuring 04/06/20 without any evidence for hypertrophic obstructive cardiomyopathy, LV dysfunction or significant valvular disease - telemetry benign thus far but would consider event monitor as outpatient to exclude arrhythmia  3. Mild aortic stenosis/aortic insufficiency - continue to follow clinically for now  4. Possible pneumomediastinum, cannot exclude small esophageal perforation - per nurse, IM has consulted GI   Risk Assessment/Risk Scores:     HEAR Score (for undifferentiated chest pain):  HEAR Score: 3   For questions or updates, please contact [place] Please consult [email or web address] for contact info under    Signed, Charlie Pitter, PA-C  04/17/2020 5:28 PM    Patient seen and examined. Agree with assessment and plan.  Ms. Maniyah Moller is a very pleasant 87 year old female who resides at friend's home [place].  He has been evaluated by Dr. Gilman Schmidt and was found to have mild aortic stenosis with hyperdynamic LV function.  She had recently been admitted with syncope due to orthostasis or possible micturition syncope.  An echo Doppler study showed an EF of 7672%, grade 1 diastolic dysfunction, mild aortic stenosis with mild AI and MR.  During her recent  admission, amlodipine, Ambien, and nadolol were discontinued but she was maintained on lisinopril.  She has a history of longstanding  hyponatremia.  She has had issues with a hiatal hernia as well as lower abdominal hernia.  She was readmitted to the hospital yesterday after experiencing a syncopal spell while on the toilet.  She subsequently developed some mild midsternal chest squeezing.  Upon presentation she was tachycardic and hypotensive.  There was no evidence for pulmonary embolism and CTA showed right pleural effusion with bibasilar atelectatic changes.  She was found again to have a hiatal hernia with distal esophageal thickening most likely due to reflux and there was some question as to possible pneumomediastinum and a small esophageal perforation could not be completely excluded.  Patient is to see GI for further evaluation.  Presently she is pain-[name].  Her blood pressure has improved and is now around 140/70.  Pulse was regular in the mid to upper 80s with a rare isolated PVC.  Mild facial bruising.  There is no JVD.  There is a 2/6 systolic murmur consistent with aortic stenosis.  Lungs are clear without wheezing.  Abdomen is nontender and she states she has a left lower abdominal hernia.  There is no edema.  Neurologic is grossly nonfocal.  She has normal affect and mood.  Present, will need to monitor blood pressure closely with orthostatic checks.  Doubt ischemic chest pain but suspect contributed  by hypotension possibly related to esophageal disorder.  Patient had mild prerenal indices.  Doubt syncope secondary to aortic stenosis but if hypovolemic with EF at 7075%, this may have been a contributor to her event.  Await GI evaluation concerning possible pneumomediastinum.  Serum sodium currently 129.  Consider evaluation for SIADH.  TSH normal at 2.357.  Should arrange for outpatient cardiac monitor post discharge.   Troy Sine, MD, [facility] 04/17/2020 5:43 PM   .

## 2020-04-18 ENCOUNTER — Inpatient Hospital Stay (HOSPITAL_COMMUNITY): Payer: Medicare Other

## 2020-04-18 DIAGNOSIS — E871 Hypo-osmolality and hyponatremia: Secondary | ICD-10-CM | POA: Diagnosis not present

## 2020-04-18 DIAGNOSIS — R55 Syncope and collapse: Secondary | ICD-10-CM | POA: Diagnosis not present

## 2020-04-18 LAB — BASIC METABOLIC PANEL
Anion gap: 6 (ref 5–15)
BUN: 16 mg/dL (ref 8–23)
CO2: 24 mmol/L (ref 22–32)
Calcium: 8.6 mg/dL — ABNORMAL LOW (ref 8.9–10.3)
Chloride: 100 mmol/L (ref 98–111)
Creatinine, Ser: 0.91 mg/dL (ref 0.44–1.00)
GFR, Estimated: >60 mL/min (ref >60)
Glucose, Bld: 83 mg/dL (ref 70–99)
Potassium: 4.2 mmol/L (ref 3.5–5.1)
Sodium: 130 mmol/L — ABNORMAL LOW (ref 135–145)

## 2020-04-18 LAB — GLUCOSE, CAPILLARY
Glucose-Capillary: 129 mg/dL — ABNORMAL HIGH (ref 70–99)
Glucose-Capillary: 133 mg/dL — ABNORMAL HIGH (ref 70–99)
Glucose-Capillary: 89 mg/dL (ref 70–99)
Glucose-Capillary: 90 mg/dL (ref 70–99)

## 2020-04-18 LAB — VITAMIN D 25 HYDROXY (VIT D DEFICIENCY, FRACTURES): Vit D, 25-Hydroxy: 57.61 ng/mL (ref 30–100)

## 2020-04-18 MED ORDER — POLYETHYLENE GLYCOL 3350 17 G PO PACK
17.0000 g | PACK | Freq: Every day | ORAL | Status: DC
  Administered 2020-04-24: 17 g via ORAL
  Filled 2020-04-18 (×6): qty 1

## 2020-04-18 MED ORDER — PROSIGHT PO TABS
1.0000 | ORAL_TABLET | Freq: Every day | ORAL | Status: DC
  Administered 2020-04-19 – 2020-04-24 (×6): 1 via ORAL
  Filled 2020-04-18 (×7): qty 1

## 2020-04-18 MED ORDER — LABETALOL HCL 5 MG/ML IV SOLN
20.0000 mg | INTRAVENOUS | Status: DC | PRN
  Administered 2020-04-18: 20 mg via INTRAVENOUS
  Filled 2020-04-18: qty 4

## 2020-04-18 MED ORDER — DOCUSATE SODIUM 100 MG PO CAPS
100.0000 mg | ORAL_CAPSULE | Freq: Every day | ORAL | Status: DC
  Administered 2020-04-18 – 2020-04-24 (×5): 100 mg via ORAL
  Filled 2020-04-18 (×7): qty 1

## 2020-04-18 NOTE — TOC Progression Note (Signed)
Transition of Care [facility]) - Progression Note    Patient Details  Name: [name] MRN: [id number] Date of Birth: 03-08-1929  Transition of Care [facility]) CM/SW [place], [place] Phone Number: 04/18/2020, 3:26 PM  Clinical Narrative:     3:17pm: CSW has reached out [facility]/ waiting on a call back.       Expected Discharge Plan and Services           Expected Discharge Date: 04/17/20                                     Social Determinants of Health (SDOH) Interventions    Readmission Risk Interventions No flowsheet data found.

## 2020-04-18 NOTE — Evaluation (Signed)
Clinical/Bedside Swallow Evaluation Patient Details  Name: [name] MRN: [id number] Date of Birth: 24-Aug-1929  Today's Date: 04/18/2020 Time: SLP Start Time (ACUTE ONLY): 0957 SLP Stop Time (ACUTE ONLY): 1027 SLP Time Calculation (min) (ACUTE ONLY): 30 min  Past Medical History:  Past Medical History:  Diagnosis Date  . Anemia associated with acute blood loss 04/03/2011  . Arthritis    knees   . Ascites   . Blood transfusion    hx of 2011   . Complication of anesthesia    Sodium drops per pt   . Cystitis   . DIARRHEA, ANTIBIOTIC ASSOCIATED 05/31/2009   Qualifier: Diagnosis of  By: Tommy Medal MD, Roderic Scarce    . GERD (gastroesophageal reflux disease)   . H/O hiatal hernia   . Hyperlipidemia   . Hypertension   . Hyponatremia   . Neutropenia with fever ([place]) 05/18/2011  . OSTEOMYELITIS, CHRONIC, LOWER LEG 04/23/2009   Qualifier: Diagnosis of  By: Johnnye Sima MD, Dellis Filbert    . Osteopenia   . OSTEOPOROSIS 04/23/2009   Qualifier: Diagnosis of  By: Johnnye Sima MD, Dellis Filbert    . Ovarian cancer ([place]) 01/25/2011  . Pneumonia    hx of   . Postmenopausal atrophic vaginitis   . PROSTHETIC JOINT COMPLICATION 03/06/5807   Qualifier: Diagnosis of  By: Tommy Medal MD, Roderic Scarce    . Renal disorder    Decreased kidney function  . Staph infection 2010   after knee replacement  . Urinary frequency   . Vulvitis    Past Surgical History:  Past Surgical History:  Procedure Laterality Date  . ABDOMINAL HYSTERECTOMY  04/01/2011   Procedure: HYSTERECTOMY ABDOMINAL;  Surgeon: Alvino Chapel, MD;  Location: [facility];  Service: Gynecology;  Laterality: N/A;  . APPENDECTOMY    . JOINT REPLACEMENT     R knee in 2008, 5 operations on L knee  . LAPAROTOMY  04/01/2011   Procedure: EXPLORATORY LAPAROTOMY;  Surgeon: Alvino Chapel, MD;  Location: [facility];  Service: Gynecology;  Laterality: N/A;  . OTHER SURGICAL HISTORY     hx of C section 1966  . SALPINGOOPHORECTOMY  04/01/2011   Procedure: SALPINGO  OOPHERECTOMY;  Surgeon: Alvino Chapel, MD;  Location: [facility];  Service: Gynecology;  Laterality: Bilateral;  . TONSILLECTOMY     HPI:  Pt is an 87 y/o female admitted 3/7 following [facility]opal episode at [facility]. Pt with admission 2/25-3/2 for syncope with fall. GI was consulted 3/8 for abnormal CT appearance of esophagus and worsening symptoms of reflux. During her esophagram she aspirated a small amount of contrast, so SLP was ordered. PMH includes GERD, HH, ovarian CA, HTN, HLD, chronic hyponatremia, insomnia   Assessment / Plan / Recommendation Clinical Impression  Pt presents with a functional appearing oropharyngeal swallow, with no overt signs of aspiration or dysphagia. She denies any subjective difficulties outside of her symptoms of reflux. SLP provided education about aspiration noted on esophagram, but pt denies any h/o PNA. It is possible that the small amount of aspiration during that study was episodic in nature, but MBS was offered to pt as an option for a more thorough assessment. At this time, pt politely declines as she feels like she does not want to have any testing done. Education was reviewed using teachback about esophageal precautions and signs of aspiration for which to monitor. SLP will sign off acutely at this time - please reorder if pt changes her mind.  SLP Visit Diagnosis: Dysphagia, unspecified (R13.10)  Aspiration Risk  Mild aspiration risk    Diet Recommendation Regular;Thin liquid   Liquid Administration via: Cup;Straw Medication Administration: Whole meds with liquid Supervision: Patient able to self feed;Intermittent supervision to cue for compensatory strategies Compensations: Slow rate;Small sips/bites Postural Changes: Seated upright at 90 degrees;Remain upright for at least 30 minutes after po intake    Other  Recommendations Oral Care Recommendations: Oral care BID   Follow up Recommendations None      Frequency and Duration             Prognosis Prognosis for Safe Diet Advancement: Good      Swallow Study   General HPI: Pt is an 87 y/o female admitted 3/7 following syncopal episode at [facility]. Pt with admission 2/25-3/2 for syncope with fall. GI was consulted 3/8 for abnormal CT appearance of esophagus and worsening symptoms of reflux. During her esophagram she aspirated a small amount of contrast, so SLP was ordered. PMH includes GERD, HH, ovarian CA, HTN, HLD, chronic hyponatremia, insomnia Type of Study: Bedside Swallow Evaluation Previous Swallow Assessment: none in chart Diet Prior to this Study: Regular;Thin liquids Temperature Spikes Noted: No Respiratory Status: Room air History of Recent Intubation: No Behavior/Cognition: Alert;Cooperative;Pleasant mood Oral Cavity Assessment: Within Functional Limits Oral Care Completed by SLP: No Oral Cavity - Dentition: Adequate natural dentition Vision: Functional for self-feeding Self-Feeding Abilities: Able to feed self Patient Positioning: Upright in bed Baseline Vocal Quality: Normal Volitional Cough: Strong Volitional Swallow: Able to elicit    Oral/Motor/Sensory Function Overall Oral Motor/Sensory Function: Within functional limits   Ice Chips Ice chips: Not tested   Thin Liquid Thin Liquid: Within functional limits Presentation: Self Fed;Straw    Nectar Thick Nectar Thick Liquid: Not tested   Honey Thick Honey Thick Liquid: Not tested   Puree Puree: Within functional limits Presentation: Self Fed;Spoon   Solid     Solid: Within functional limits Presentation: Self Fed      Osie Bond., M.A. [place] Pager [phone] Office [phone]  04/18/2020,11:12 AM

## 2020-04-18 NOTE — Progress Notes (Signed)
Orthopedic Tech Progress Note Patient Details:  [name] [birth year] [id number]  Ortho Devices Type of Ortho Device: Postop shoe/boot Ortho Device/Splint Location: RLE Ortho Device/Splint Interventions: Ordered,Application,Adjustment   Post Interventions Patient Tolerated: Well Instructions Provided: [facility] of [place] 04/18/2020, 4:01 PM

## 2020-04-18 NOTE — Progress Notes (Addendum)
Progress Note  Patient Name: [name] Date of Encounter: 04/18/2020  [facility] Cardiologist: Donato Heinz, MD   Subjective   No specific complaints this morning. Happy to have good news about her GI eval.   Inpatient Medications    Scheduled Meds: . enoxaparin (LOVENOX) injection  40 mg Subcutaneous Q24H  . melatonin  3 mg Oral QHS  . pantoprazole (PROTONIX) IV  40 mg Intravenous Q12H  . sodium chloride flush  3 mL Intravenous Q12H  . sodium chloride  1 g Oral BID WC   Continuous Infusions: . sodium chloride 50 mL/hr at 04/18/20 0400   PRN Meds: acetaminophen **OR** acetaminophen, calcium carbonate   Vital Signs    Vitals:   04/18/20 0738 04/18/20 0800 04/18/20 1006 04/18/20 1107  BP: (!) 110/56 (!) 151/60 (!) 151/67 (!) 143/57  Pulse: 74 72  80  Resp: 17 15  16   Temp: 98.1 F (36.7 C)   98.3 F (36.8 C)  TempSrc: Oral   Oral  SpO2: 94% 94%  99%  Weight:      Height:        Intake/Output Summary (Last 24 hours) at 04/18/2020 1126 Last data filed at 04/18/2020 0600 Gross per 24 hour  Intake 551.09 ml  Output 1900 ml  Net -1348.91 ml   Last 3 Weights 04/18/2020 04/17/2020 04/12/2020  Weight (lbs) 166 lb 14.2 oz 167 lb 5.3 oz 167 lb 8 oz  Weight (kg) 75.7 kg 75.9 kg 75.978 kg      Telemetry    SR rate 70s - Personally Reviewed  ECG    No new tracing  Physical Exam   GEN: No acute distress.   Neck: No JVD Cardiac: RRR, + systolic murmur, no rubs, or gallops.  Respiratory: Clear to auscultation bilaterally. GI: Soft, nontender, non-distended  MS: No edema; No deformity. Neuro:  Nonfocal  Psych: Normal affect   Labs    High Sensitivity Troponin:   Recent Labs  Lab 04/06/20 0207 04/06/20 0526 04/16/20 1731 04/16/20 2110  TROPONINIHS 8 5 20* 18*      Chemistry Recent Labs  Lab 04/17/20 0125 04/17/20 1131 04/18/20 0130  NA 129* 129* 130*  K 4.7 4.3 4.2  CL 95* 99 100  CO2 26 22 24   GLUCOSE 129* 125* 83  BUN 27* 22  16  CREATININE 1.06* 1.02* 0.91  CALCIUM 8.9 8.8* 8.6*  GFRNONAA 51* 53* >60  ANIONGAP 8 8 6      Hematology Recent Labs  Lab 04/16/20 1731 04/17/20 0125  WBC 9.5 9.9  RBC 4.71 4.14  HGB 13.1 12.0  HCT 40.6 35.3*  MCV 86.2 85.3  MCH 27.8 29.0  MCHC 32.3 34.0  RDW 15.4 15.6*  PLT 395 326    BNPNo results for input(s): BNP, PROBNP in the last 168 hours.   DDimer No results for input(s): DDIMER in the last 168 hours.   Radiology    CT ABDOMEN PELVIS WO CONTRAST  Addendum Date: 04/17/2020   ADDENDUM REPORT: 04/17/2020 16:01 ADDENDUM: Findings should include old healed right ninth and tenth rib fractures. Electronically Signed   By: Iven Finn M.D.   On: 04/17/2020 16:01   Result Date: 04/17/2020 CLINICAL DATA:  Nonlocalized acute abdominal pain. EXAM: CT ABDOMEN AND PELVIS WITHOUT CONTRAST TECHNIQUE: Multidetector CT imaging of the abdomen and pelvis was performed following the standard protocol without IV contrast. COMPARISON:  CT angiography chest 04/16/2020, CT abdomen pelvis 03/05/2011 FINDINGS: Lower chest: Persistent trace right pleural  effusion. Bilateral lower lobe subsegmental atelectasis. At least small volume hiatal hernia with persistent distal esophageal thickening. Medial left base atelectasis (6:60); however, a component of trace inferior pneumomediastinum cannot be excluded (3: 4-11). Hepatobiliary: No focal liver abnormality. The gallbladder is contracted. Punctate calcific density within the gallbladder lumen likely represents a gallstone. No gallbladder wall thickening or pericholecystic fluid. No biliary dilatation. Pancreas: No focal lesion. Normal pancreatic contour. No surrounding inflammatory changes. No main pancreatic ductal dilatation. Spleen: Normal in size without focal abnormality. Adrenals/Urinary Tract: No adrenal nodule bilaterally. Punctate calcific densities within bilateral kidneys. No hydronephrosis and no contour-deforming renal mass. No  ureterolithiasis or hydroureter. Fluid fluid level within the urinary bladder likely related to previously administered intravenous contrast for CT angiography chest 04/16/2020. Stomach/Bowel: Stomach is within normal limits. No evidence of bowel wall thickening or dilatation. Diffuse colonic diverticulosis. No pneumatosis. The appendix not definitely identified. Vascular/Lymphatic: Several punctate phleboliths are noted within the pelvis. Focal dilatation of the infrarenal abdominal aorta measuring up to 2.8 cm in diameter with no definite abdominal aorta or iliac aneurysm. Moderate to severe atherosclerotic plaque of the aorta and its branches. No abdominal, pelvic, or inguinal lymphadenopathy. Reproductive: Status post hysterectomy. No adnexal masses. Other: No intraperitoneal free fluid. No intraperitoneal free gas. No organized fluid collection. Musculoskeletal: Two large consecutive anterior abdominal wall hernias with the likely paraumbilical hernia containing a long segment of the mid to distal transverse colon with an abdominal defect of 5.8 x 4.5 cm. The inferior-most hernia likely a infraumbilical hernia contains several loops of small bowel and demonstrates a abdominal defect of approximately 5.8 x 3.7 cm. Grade 1 anterolisthesis of L4 on L5. Multilevel facet arthropathy. Visualized lower thoracic spine demonstrates multilevel osteophyte formation. No suspicious lytic or blastic osseous lesions. No acute displaced fracture. IMPRESSION: 1. Question trace inferior pneumomediastinum. In the setting of a small hiatal hernia and distal esophageal thickening, a small or contained esophageal perforation cannot be excluded. 2. Two large consecutive anterior abdominal wall hernias, one of which is likely paraumbilical, containing small and large bowel. No definite findings of ischemia or associated bowel obstruction with however limited evaluation due to venous noncontrast study. 3. Colonic diverticulosis with no  acute diverticulitis. 4. Nonobstructive punctate nephrolithiasis bilaterally. 5. Aortic Atherosclerosis (ICD10-I70.0). These results were called by telephone at the time of interpretation on 04/17/2020 at 3:45 pm to provider [facility] , who verbally acknowledged these results. Electronically Signed: By: Iven Finn M.D. On: 04/17/2020 15:52   CT Angio Chest PE W and/or Wo Contrast  Result Date: 04/16/2020 CLINICAL DATA:  Chest pain, history of prior fall, initial encounter EXAM: CT ANGIOGRAPHY CHEST WITH CONTRAST TECHNIQUE: Multidetector CT imaging of the chest was performed using the standard protocol during bolus administration of intravenous contrast. Multiplanar CT image reconstructions and MIPs were obtained to evaluate the vascular anatomy. CONTRAST:  71mL OMNIPAQUE IOHEXOL 350 MG/ML SOLN COMPARISON:  Chest x-ray from earlier in the same day. FINDINGS: Cardiovascular: Thoracic aorta demonstrates atherosclerotic calcifications without aneurysmal dilatation. The degree of aortic enhancement is not sufficient to evaluate for dissection. No cardiac enlargement is seen. Diffuse coronary calcifications are noted. The pulmonary artery shows a normal branching pattern. No filling defect to suggest pulmonary embolism is noted. Mediastinum/Nodes: Thoracic inlet is within normal limits. No sizable hilar or mediastinal adenopathy is noted. Esophageal thickening is noted distally likely related to reflux. Hiatal hernia is seen. Lungs/Pleura: Mild bibasilar atelectatic changes are seen. Small right pleural effusion is noted. No sizable infiltrate is  seen. No sizable parenchymal nodules are noted. Upper Abdomen: Visualized upper abdomen is otherwise within normal limits. Musculoskeletal: Degenerative changes of the thoracic spine are seen. No acute rib abnormality is seen. Review of the MIP images confirms the above findings. IMPRESSION: No evidence of pulmonary emboli. Small right pleural effusion and bibasilar  atelectatic changes. Hiatal hernia with distal esophageal thickening likely related to reflux. Aortic Atherosclerosis (ICD10-I70.0). Electronically Signed   By: Inez Catalina M.D.   On: 04/16/2020 19:55   DG CHEST PORT 1 VIEW  Result Date: 04/18/2020 CLINICAL DATA:  History of prior fall.  History of chest pain. EXAM: PORTABLE CHEST 1 VIEW COMPARISON:  CT 04/16/2020.  Chest x-ray 04/16/2020. FINDINGS: Mediastinum hilar structures normal. Heart size normal. No focal infiltrate. No pleural effusion or pneumothorax. Prominent skin folds noted. Degenerative change thoracic spine. IMPRESSION: No acute cardiopulmonary disease.  Chest is stable from prior exam. Electronically Signed   By: Marcello Moores  Register   On: 04/18/2020 07:39   DG Chest Portable 1 View  Result Date: 04/16/2020 CLINICAL DATA:  Recent fall, chest pain EXAM: PORTABLE CHEST 1 VIEW COMPARISON:  08/04/2019 FINDINGS: Single frontal view of the chest demonstrates an unremarkable cardiac silhouette. No airspace disease, effusion, or pneumothorax. No acute bony abnormalities. IMPRESSION: 1. No acute intrathoracic process. Electronically Signed   By: Randa Ngo M.D.   On: 04/16/2020 18:49   DG Foot 2 Views Right  Result Date: 04/18/2020 CLINICAL DATA:  Pain following recent fall EXAM: RIGHT FOOT - 2 VIEW COMPARISON:  March 19, 2020. FINDINGS: Frontal and lateral views obtained. Subtle transverse lucencies in the proximal third and fourth metatarsals are stable, concerning for nondisplaced fractures. Evidence of old trauma with remodeling distal fourth metatarsal. No evidence of new fracture compared to prior study. No dislocation. There is underlying osteoporosis. No appreciable joint space narrowing. There is an inferior calcaneal spur. There is mild spurring in the dorsal midfoot. Foci of arterial vascular calcification noted. IMPRESSION: Persistent subtle transverse lucencies in the proximal third and proximal fourth metatarsals, unchanged from  prior study and likely representing subtle nondisplaced fractures in these areas. Old healed fracture distal fourth metatarsal with remodeling. No acute fracture compared to prior study. No dislocation. Mild spurring dorsal midfoot. Inferior calcaneal spur. Foci of arterial vascular atherosclerotic calcification noted. Electronically Signed   By: Lowella Grip III M.D.   On: 04/18/2020 11:16   DG ESOPHAGUS W SINGLE CM (SOL OR THIN BA)  Result Date: 04/17/2020 CLINICAL DATA:  Possible distal esophageal perforation near the GE junction. EXAM: ESOPHOGRAM/BARIUM SWALLOW TECHNIQUE: Single contrast examination was performed using water-soluble contrast. FLUOROSCOPY TIME:  Fluoroscopy Time:  1.7 minutes Radiation Exposure Index (if provided by the fluoroscopic device): 21.5 mGy Number of Acquired Spot Images: 0 COMPARISON:  CT abdomen pelvis from same day. Esophagram dated May 09, 2016. FINDINGS: Normal esophageal course and contour. No obstruction, stricture, or perforation. Unchanged small hiatal hernia. The procedure was terminated after the patient aspirated a small amount of contrast. IMPRESSION: 1. No evidence of esophageal perforation. 2. Positive for aspiration. Consider modified barium swallow study for further evaluation. Electronically Signed   By: Titus Dubin M.D.   On: 04/17/2020 19:05    Cardiac Studies   N/a   Patient Profile     87 y.o. female with a hx of remote ovarian CA, mild aortic stenosis, HTN, HLD, anemia, hiatal hernia, prior prosthetic joint infection, chronic appearing hyponatremia who was seen for the evaluation of chest pain and syncope at the  request of Dr. Nevada Crane.  Assessment & Plan    1. Chest pain: in the setting of hypotension, which resolved once BP improved.  -- hsTn with low/flat trend -- no recurrent episodes of chest pain  2. Recurrent syncope: Seems to be in the setting of orthostatic hypotension which is a term she is familiar with. Thinks she has been  told this in the past.  -- now wearing TED hose -- blood pressures are now stable -- no observed high degree AVB or arrhyhtmia noted on telemetry thus far -- would plan for outpatient cardiac monitor at the time of discharge   3. Mild aortic stenosis/aortic insufficieny: low suspicion this is contributing to her syncope  4. Esophageal thickening noted on Chest CT: evaluated by GI with barium swallow which was negative for acute finding  -- recommendations for PPI for treatment of esophagitis   For questions or updates, please contact [facility] Please consult [email or web address] for contact info under      Signed, Reino Bellis, NP  04/18/2020, 11:26 AM    Patient seen and examined. Agree with assessment and plan. Feels better; BP stable.No recurrent chest pain. No dizziness. Rhythm stable; sinus in the 80s; rare isolated unifocal PVC's.  Barium swallow without evidence for esophageal perforation, but positive for aspiration.   I had a lengthy discussion with daughter. Continue to monitor orthostatics, support stockings and plan 30 day monitor post dc with cardiology F/U with Dr. Abundio Miu, MD, [facility] 04/18/2020 12:49 PM

## 2020-04-18 NOTE — Progress Notes (Signed)
Patient's barium swallow yesterday was negative for evidence of tumor, obstruction, stricture, or obvious mucosal abnormalities.  Some aspiration was noted.  These findings were reviewed with the patient.  Recommendations:  1.  I will sign off.  Please call if I can be of further assistance in this patient's care  2.  I do feel a swallowing study with speech therapy is appropriate, in view of the documented aspiration on the standard barium swallow.  3.  I would recommend that the patient be maintained on PPI therapy indefinitely.  I would probably send her home on pantoprazole 40 mg twice daily, the dose she is on in the hospital, to treat any esophagitis that may be present.  Thereafter, her primary care physician might be able to reduce that to once daily dosing in a month or so.  4.  The patient does not want endoscopic evaluation, nor do I feel that it is necessary in her case, as long as she has an adequate symptomatic response to PPI therapy, and is free of "alarm symptoms" on outpatient follow-up, such as development of dysphagia or progressive unexplained weight loss.  5.  Follow-up with me in the office is not necessary.  She can follow with her primary physician to monitor response to PPI medication.  However, I did give the patient my card, and would be happy to see her if her symptoms are refractory to medical management, or even if simply she or her primary care physician wants me to see her in follow-up.  Cleotis Nipper, M.D. Pager [phone] If no answer or after 5 PM call [phone]

## 2020-04-18 NOTE — Progress Notes (Signed)
PROGRESS NOTE    [name]  PZW:[id number] DOB: 1929-06-22 DOA: 04/16/2020 PCP: Hulan Fess, MD   Chief Complaint  Patient presents with  . Chest Pain    Brief Narrative:  Grey Schlauch Longm[place] Severin Bou 87 y.o.femalewith medical history significant forhypertension, remote ovarian cancer, chronic hyponatremia who presents to the ED from SNF for evaluation of syncope and chest pain.History is supplemented by daughter at bedside.  Patient was recently admitted from 04/06/2020-04/11/2020 for syncopewith fall, suspected orthostatic versus micturition syncope in etiology+/- Ambien effect.TTE 04/06/2020 showed EF 70-75%, G1 DD, mild AR andAS. Her Ambien, Norvasc,and nadolol werediscontinued, lisinopril was continued on discharge. TED hose and advised to increase oral intake were recommended. She was discharged to SNF.  Daughter states that at her SNF, on the day of presentation, nursing reported that around 2 PM they were assisting her to ambulate to the bathroom when she became lightheaded/dizzy. They sat her on the commode and she developed Tiara Bartoli transient unresponsive episode. She did not void prior to this unresponsive period.Her blood pressure was reportedly 150s/99. Patient does state that she becomes easily lightheaded/dizzy when changing positions and soon after beginning to walk.  Around 3 PM patient developed Beuford Garcilazo midsternal pressure-like chest discomfort without radiation. She did not have any associated diaphoresis, nausea, vomiting, or dyspnea. Symptoms have since resolved. She reports chronic heartburn/acid reflux related to Shonte Beutler hiatal hernia which is notably different than the symptoms she was experiencing. Due to her chest discomfort she was sent to the ED for further evaluation.   Assessment & Plan:   Principal Problem:   Syncope Active Problems:   Hyponatremia   AKI (acute kidney injury) ([place])  Recurrent syncopalepisode with orthostatic hypotension. She has  positive orthostatic vital signs -> SBP drops from 150 to 121 on standing 3/8 Repeat orthostatics daily Thigh high ted hose ordered today (when at SNF, can d/c overnight and place during daytime) Hold BP meds PT assessment recommended SNF. Continue PT OT with assistance and fall precautions. TOC assisting with SNF placement. Cardiology consult, appreciate recs - 30 day monitor post d/c with cards, compression stockings, follow orthostatics  Nondisplaced Proximal 3rd and 4th metatarsal fractures Maybe contributing to falls/orthostasis if causing difficulty with ambulation or increasing time in bed due to pain Post op shoe, wbat, PT/OT Can follow with orthopedics outpatient  Follow vitamin D level  Concern for esophageal perforation esophogram without evidence of perforation CXR today without acute cardiopulm disease  Aspiration: noted on esophagram, SLP evaluated today.  She denies any symptoms of coughing/choking while eating.  She's declined MBS with speech today.  Follow outpatient  Distal Esophageal Thickening  GERD:  BID PPI She's declined endoscopic evaluation here - consider outpatient GI follow up if "alarm symptoms" Follow with GI prn  Hernias Hiatal hernia and 2 large anterior abdominal wall hernias Follow outpatient with surgery  Resolved chest pain: Patient with transient midsternal pressure-like chest discomfort without radiation. She says this felt different than her reflux symptoms. EKG is reassuring without acute ischemic changes - appears similar to priors. Troponin is weakly positive at 20, trended down to 18. No prior known cardiac history. Diffuse coronary calcifications and aortic atherosclerosis noted on CTA on admission. Cardiology following, outpatient follow up  Resolving AKI likely prerenal in the setting of dehydration: Creatinine 1.12 on admission up from 0.85 one week ago. Likely prerenal from hypovolemia. Improving, follow   Acute on  chronic hyponatremia: Sodium 126 on admission, baseline around 130. Likely hypovolemic hyponatremia in setting of improvement  with IVF Improved today, started on salt tabs - hold for now, follow repeat in AM  Hypertension Hold antihypertensives in setting of orthostatic hypotension - likely will have to allow some degree of hypertension due to her orthostatic hypotension which is symptomatic  DVT prophylaxis: lovenox Code Status: full  Family Communication: daughter at bedside Disposition:   Status is: Inpatient  Remains inpatient appropriate because:Inpatient level of care appropriate due to severity of illness   Dispo:  Patient From: [place]  Planned Disposition: [place]  Medically stable for discharge: No     Consultants:   GERD  cardiology  Procedures:   none  Antimicrobials:  Anti-infectives (From admission, onward)   None     Subjective: Asking to eat Doesn't want endoscopy, won't do any procedure until she eats Discussed plan of care with patient and daughter  Objective: Vitals:   04/18/20 0738 04/18/20 0800 04/18/20 1006 04/18/20 1107  BP: (!) 110/56 (!) 151/60 (!) 151/67 (!) 143/57  Pulse: 74 72  80  Resp: 17 15  16   Temp: 98.1 F (36.7 C)   98.3 F (36.8 C)  TempSrc: Oral   Oral  SpO2: 94% 94%  99%  Weight:      Height:        Intake/Output Summary (Last 24 hours) at 04/18/2020 1624 Last data filed at 04/18/2020 1241 Gross per 24 hour  Intake 637.57 ml  Output 1900 ml  Net -1262.43 ml   Filed Weights   04/17/20 0100 04/18/20 0615  Weight: 75.9 kg 75.7 kg    Examination:  General exam: Appears calm and comfortable  Respiratory system: Clear to auscultation. Respiratory effort normal. Cardiovascular system: S1 & S2 heard, RRR.  Gastrointestinal system: Abdomen is nondistended, soft and nontender.  Central nervous system: Alert and oriented. No focal neurological deficits. Extremities: no LEE - TTP  along dorsum of R foot near toes Skin: No rashes, lesions or ulcers Psychiatry: Judgement and insight appear normal. Mood & affect appropriate.     Data Reviewed: I have personally reviewed following labs and imaging studies  CBC: Recent Labs  Lab 04/16/20 1731 04/17/20 0125  WBC 9.5 9.9  HGB 13.1 12.0  HCT 40.6 35.3*  MCV 86.2 85.3  PLT 395 854    Basic Metabolic Panel: Recent Labs  Lab 04/16/20 1731 04/17/20 0125 04/17/20 1131 04/18/20 0130  NA 126* 129* 129* 130*  K 4.4 4.7 4.3 4.2  CL 92* 95* 99 100  CO2 23 26 22 24   GLUCOSE 135* 129* 125* 83  BUN 26* 27* 22 16  CREATININE 1.12* 1.06* 1.02* 0.91  CALCIUM 9.8 8.9 8.8* 8.6*  MG  --  1.8  --   --     GFR: Estimated Creatinine Clearance: 41.6 mL/min (by C-G formula based on SCr of 0.91 mg/dL).  Liver Function Tests: No results for input(s): AST, ALT, ALKPHOS, BILITOT, PROT, ALBUMIN in the last 168 hours.  CBG: Recent Labs  Lab 04/17/20 0611 04/18/20 0019 04/18/20 0613 04/18/20 1110  GLUCAP 102* 89 90 129*     Recent Results (from the past 240 hour(s))  SARS CORONAVIRUS 2 (TAT 6-24 HRS)     Status: None   Collection Time: 04/10/20 12:15 PM  Result Value Ref Range Status   SARS Coronavirus 2 NEGATIVE NEGATIVE Final    Comment: (NOTE) SARS-CoV-2 target nucleic acids are NOT DETECTED.  The SARS-CoV-2 RNA is generally detectable in upper and lower respiratory specimens during the acute phase of infection.  Negative results do not preclude SARS-CoV-2 infection, do not rule out co-infections with other pathogens, and should not be used as the sole basis for treatment or other patient management decisions. Negative results must be combined with clinical observations, patient history, and epidemiological information. The expected result is Negative.  Fact Sheet for Patients: SugarRoll.be  Fact Sheet for Healthcare  Providers: https://www.woods-mathews.com/  This test is not yet approved or cleared by the [place] FDA and  has been authorized for detection and/or diagnosis of SARS-CoV-2 by FDA under an Emergency Use Authorization (EUA). This EUA will remain  in effect (meaning this test can be used) for the duration of the COVID-19 declaration under Se ction 564(b)(1) of the Act, 21 U.S.C. section 360bbb-3(b)(1), unless the authorization is terminated or revoked sooner.  Performed at [facility], [place] [address], [place], [place] [address]   SARS CORONAVIRUS 2 (TAT 6-24 HRS) Nasopharyngeal Nasopharyngeal Swab     Status: None   Collection Time: 04/16/20  8:56 PM   Specimen: Nasopharyngeal Swab  Result Value Ref Range Status   SARS Coronavirus 2 NEGATIVE NEGATIVE Final    Comment: (NOTE) SARS-CoV-2 target nucleic acids are NOT DETECTED.  The SARS-CoV-2 RNA is generally detectable in upper and lower respiratory specimens during the acute phase of infection. Negative results do not preclude SARS-CoV-2 infection, do not rule out co-infections with other pathogens, and should not be used as the sole basis for treatment or other patient management decisions. Negative results must be combined with clinical observations, patient history, and epidemiological information. The expected result is Negative.  Fact Sheet for Patients: SugarRoll.be  Fact Sheet for Healthcare Providers: https://www.woods-mathews.com/  This test is not yet approved or cleared by the [place] FDA and  has been authorized for detection and/or diagnosis of SARS-CoV-2 by FDA under an Emergency Use Authorization (EUA). This EUA will remain  in effect (meaning this test can be used) for the duration of the COVID-19 declaration under Se ction 564(b)(1) of the Act, 21 U.S.C. section 360bbb-3(b)(1), unless the authorization is terminated or revoked  sooner.  Performed at [facility], [place] [address], [place], [place] [address]   Culture, blood (routine x 2)     Status: None (Preliminary result)   Collection Time: 04/16/20  9:10 PM   Specimen: BLOOD  Result Value Ref Range Status   Specimen Description BLOOD SITE NOT SPECIFIED  Final   Special Requests   Final    BOTTLES DRAWN AEROBIC AND ANAEROBIC Blood Culture adequate volume   Culture   Final    NO GROWTH 2 DAYS Performed at [facility], [place] [address] [place], [place] [address]    Report Status PENDING  Incomplete  Culture, blood (routine x 2)     Status: None (Preliminary result)   Collection Time: 04/16/20  9:11 PM   Specimen: BLOOD  Result Value Ref Range Status   Specimen Description BLOOD SITE NOT SPECIFIED  Final   Special Requests   Final    BOTTLES DRAWN AEROBIC AND ANAEROBIC Blood Culture adequate volume   Culture   Final    NO GROWTH 2 DAYS Performed at [facility], [address] [place], [place] [address]    Report Status PENDING  Incomplete  MRSA PCR Screening     Status: None   Collection Time: 04/17/20  2:08 AM   Specimen: Nasal Mucosa; Nasopharyngeal  Result Value Ref Range Status   MRSA by PCR NEGATIVE  NEGATIVE Final    Comment:        The GeneXpert MRSA Assay (FDA approved for NASAL specimens only), is one component of Jaycob Mcclenton comprehensive MRSA colonization surveillance program. It is not intended to diagnose MRSA infection nor to guide or monitor treatment for MRSA infections. Performed at [facility], [place] [address], [place], [place] [address]          Radiology Studies: CT ABDOMEN PELVIS WO CONTRAST  Addendum Date: 04/17/2020   ADDENDUM REPORT: 04/17/2020 16:01 ADDENDUM: Findings should include old healed right ninth and tenth rib fractures. Electronically Signed   By: Iven Finn M.D.   On: 04/17/2020 16:01   Result Date: 04/17/2020 CLINICAL DATA:  Nonlocalized acute abdominal pain. EXAM: CT ABDOMEN AND  PELVIS WITHOUT CONTRAST TECHNIQUE: Multidetector CT imaging of the abdomen and pelvis was performed following the standard protocol without IV contrast. COMPARISON:  CT angiography chest 04/16/2020, CT abdomen pelvis 03/05/2011 FINDINGS: Lower chest: Persistent trace right pleural effusion. Bilateral lower lobe subsegmental atelectasis. At least small volume hiatal hernia with persistent distal esophageal thickening. Medial left base atelectasis (6:60); however, Romon Devereux component of trace inferior pneumomediastinum cannot be excluded (3: 4-11). Hepatobiliary: No focal liver abnormality. The gallbladder is contracted. Punctate calcific density within the gallbladder lumen likely represents Goldy Calandra gallstone. No gallbladder wall thickening or pericholecystic fluid. No biliary dilatation. Pancreas: No focal lesion. Normal pancreatic contour. No surrounding inflammatory changes. No main pancreatic ductal dilatation. Spleen: Normal in size without focal abnormality. Adrenals/Urinary Tract: No adrenal nodule bilaterally. Punctate calcific densities within bilateral kidneys. No hydronephrosis and no contour-deforming renal mass. No ureterolithiasis or hydroureter. Fluid fluid level within the urinary bladder likely related to previously administered intravenous contrast for CT angiography chest 04/16/2020. Stomach/Bowel: Stomach is within normal limits. No evidence of bowel wall thickening or dilatation. Diffuse colonic diverticulosis. No pneumatosis. The appendix not definitely identified. Vascular/Lymphatic: Several punctate phleboliths are noted within the pelvis. Focal dilatation of the infrarenal abdominal aorta measuring up to 2.8 cm in diameter with no definite abdominal aorta or iliac aneurysm. Moderate to severe atherosclerotic plaque of the aorta and its branches. No abdominal, pelvic, or inguinal lymphadenopathy. Reproductive: Status post hysterectomy. No adnexal masses. Other: No intraperitoneal free fluid. No  intraperitoneal free gas. No organized fluid collection. Musculoskeletal: Two large consecutive anterior abdominal wall hernias with the likely paraumbilical hernia containing Ryaan Vanwagoner long segment of the mid to distal transverse colon with an abdominal defect of 5.8 x 4.5 cm. The inferior-most hernia likely Martrell Eguia infraumbilical hernia contains several loops of small bowel and demonstrates Kalah Pflum abdominal defect of approximately 5.8 x 3.7 cm. Grade 1 anterolisthesis of L4 on L5. Multilevel facet arthropathy. Visualized lower thoracic spine demonstrates multilevel osteophyte formation. No suspicious lytic or blastic osseous lesions. No acute displaced fracture. IMPRESSION: 1. Question trace inferior pneumomediastinum. In the setting of Millette Halberstam small hiatal hernia and distal esophageal thickening, Ansley Stanwood small or contained esophageal perforation cannot be excluded. 2. Two large consecutive anterior abdominal wall hernias, one of which is likely paraumbilical, containing small and large bowel. No definite findings of ischemia or associated bowel obstruction with however limited evaluation due to venous noncontrast study. 3. Colonic diverticulosis with no acute diverticulitis. 4. Nonobstructive punctate nephrolithiasis bilaterally. 5. Aortic Atherosclerosis (ICD10-I70.0). These results were called by telephone at the time of interpretation on 04/17/2020 at 3:45 pm to provider [facility] , who verbally acknowledged these results. Electronically Signed: By: Iven Finn M.D. On: 04/17/2020 15:52   CT Angio Chest PE  W and/or Wo Contrast  Result Date: 04/16/2020 CLINICAL DATA:  Chest pain, history of prior fall, initial encounter EXAM: CT ANGIOGRAPHY CHEST WITH CONTRAST TECHNIQUE: Multidetector CT imaging of the chest was performed using the standard protocol during bolus administration of intravenous contrast. Multiplanar CT image reconstructions and MIPs were obtained to evaluate the vascular anatomy. CONTRAST:  3mL OMNIPAQUE IOHEXOL 350  MG/ML SOLN COMPARISON:  Chest x-ray from earlier in the same day. FINDINGS: Cardiovascular: Thoracic aorta demonstrates atherosclerotic calcifications without aneurysmal dilatation. The degree of aortic enhancement is not sufficient to evaluate for dissection. No cardiac enlargement is seen. Diffuse coronary calcifications are noted. The pulmonary artery shows Cayman Brogden normal branching pattern. No filling defect to suggest pulmonary embolism is noted. Mediastinum/Nodes: Thoracic inlet is within normal limits. No sizable hilar or mediastinal adenopathy is noted. Esophageal thickening is noted distally likely related to reflux. Hiatal hernia is seen. Lungs/Pleura: Mild bibasilar atelectatic changes are seen. Small right pleural effusion is noted. No sizable infiltrate is seen. No sizable parenchymal nodules are noted. Upper Abdomen: Visualized upper abdomen is otherwise within normal limits. Musculoskeletal: Degenerative changes of the thoracic spine are seen. No acute rib abnormality is seen. Review of the MIP images confirms the above findings. IMPRESSION: No evidence of pulmonary emboli. Small right pleural effusion and bibasilar atelectatic changes. Hiatal hernia with distal esophageal thickening likely related to reflux. Aortic Atherosclerosis (ICD10-I70.0). Electronically Signed   By: Inez Catalina M.D.   On: 04/16/2020 19:55   DG CHEST PORT 1 VIEW  Result Date: 04/18/2020 CLINICAL DATA:  History of prior fall.  History of chest pain. EXAM: PORTABLE CHEST 1 VIEW COMPARISON:  CT 04/16/2020.  Chest x-ray 04/16/2020. FINDINGS: Mediastinum hilar structures normal. Heart size normal. No focal infiltrate. No pleural effusion or pneumothorax. Prominent skin folds noted. Degenerative change thoracic spine. IMPRESSION: No acute cardiopulmonary disease.  Chest is stable from prior exam. Electronically Signed   By: Marcello Moores  Register   On: 04/18/2020 07:39   DG Chest Portable 1 View  Result Date: 04/16/2020 CLINICAL DATA:   Recent fall, chest pain EXAM: PORTABLE CHEST 1 VIEW COMPARISON:  08/04/2019 FINDINGS: Single frontal view of the chest demonstrates an unremarkable cardiac silhouette. No airspace disease, effusion, or pneumothorax. No acute bony abnormalities. IMPRESSION: 1. No acute intrathoracic process. Electronically Signed   By: Randa Ngo M.D.   On: 04/16/2020 18:49   DG Foot 2 Views Right  Result Date: 04/18/2020 CLINICAL DATA:  Pain following recent fall EXAM: RIGHT FOOT - 2 VIEW COMPARISON:  March 19, 2020. FINDINGS: Frontal and lateral views obtained. Subtle transverse lucencies in the proximal third and fourth metatarsals are stable, concerning for nondisplaced fractures. Evidence of old trauma with remodeling distal fourth metatarsal. No evidence of new fracture compared to prior study. No dislocation. There is underlying osteoporosis. No appreciable joint space narrowing. There is an inferior calcaneal spur. There is mild spurring in the dorsal midfoot. Foci of arterial vascular calcification noted. IMPRESSION: Persistent subtle transverse lucencies in the proximal third and proximal fourth metatarsals, unchanged from prior study and likely representing subtle nondisplaced fractures in these areas. Old healed fracture distal fourth metatarsal with remodeling. No acute fracture compared to prior study. No dislocation. Mild spurring dorsal midfoot. Inferior calcaneal spur. Foci of arterial vascular atherosclerotic calcification noted. Electronically Signed   By: Lowella Grip III M.D.   On: 04/18/2020 11:16   DG ESOPHAGUS W SINGLE CM (SOL OR THIN BA)  Result Date: 04/17/2020 CLINICAL DATA:  Possible distal esophageal perforation  near the GE junction. EXAM: ESOPHOGRAM/BARIUM SWALLOW TECHNIQUE: Single contrast examination was performed using water-soluble contrast. FLUOROSCOPY TIME:  Fluoroscopy Time:  1.7 minutes Radiation Exposure Index (if provided by the fluoroscopic device): 21.5 mGy Number of Acquired  Spot Images: 0 COMPARISON:  CT abdomen pelvis from same day. Esophagram dated May 09, 2016. FINDINGS: Normal esophageal course and contour. No obstruction, stricture, or perforation. Unchanged small hiatal hernia. The procedure was terminated after the patient aspirated Anneka Studer small amount of contrast. IMPRESSION: 1. No evidence of esophageal perforation. 2. Positive for aspiration. Consider modified barium swallow study for further evaluation. Electronically Signed   By: Titus Dubin M.D.   On: 04/17/2020 19:05        Scheduled Meds: . docusate sodium  100 mg Oral Daily  . enoxaparin (LOVENOX) injection  40 mg Subcutaneous Q24H  . melatonin  3 mg Oral QHS  . pantoprazole (PROTONIX) IV  40 mg Intravenous Q12H  . polyethylene glycol  17 g Oral Daily  . sodium chloride flush  3 mL Intravenous Q12H  . sodium chloride  1 g Oral BID WC   Continuous Infusions:   LOS: 1 day    Time spent: over 30 min    Fayrene Helper, MD [facility]   To contact the attending provider between 7A-7P or the covering provider during after hours 7P-7A, please log into the web site [email or web address] and access using universal [place] password for that web site. If you do not have the password, please call the hospital operator.  04/18/2020, 4:24 PM

## 2020-04-18 NOTE — TOC Progression Note (Signed)
Transition of Care [facility]) - Progression Note    Patient Details  Name: [name] MRN: [id number] Date of Birth: [birth year]  Transition of Care [facility]) CM/SW [place], [place] Phone Number: 04/18/2020, 4:29 PM  Clinical Narrative:    4:25pm CSW contacted admissions to confirm the return of pt to [place], there was no answer CSW left a message.        Expected Discharge Plan and Services           Expected Discharge Date: 04/17/20                                     Social Determinants of Health (SDOH) Interventions    Readmission Risk Interventions No flowsheet data found.

## 2020-04-18 NOTE — Evaluation (Signed)
Occupational Therapy Evaluation Patient Details Name: [name] MRN: [id number] DOB: 1929-05-28 Today's Date: 04/18/2020    History of Present Illness Pt is an 87 y/o female admitted 3/7 following syncopal episode at [facility]. Pt with admission 2/25-3/2 for syncope with fall. PMH includes ovarian CA; HTN, HLD, chronic hyponatremia, insomnia   Clinical Impression   Pt PTA: Pt from [facility] and returned to ED from recent hospitalization for syncope. Pt currently, supervisionA to minA for ADL. Pt wearing RLE ankle brace and assist to donn/doff. Pt minguardA to minA for taking a few steps to and from [facility] and bed. Pt did not report dizziness, but pt with +orthostatic vitals thoughout session. pt's daughter in room to conclude session. Pt supine 154/93; sit 117/70; standing 103/67; sitting after exertion 129/68. Pt would benefit from continued OT skilled services.  ** may need W/C for mobility if orthostasis continues      Follow Up Recommendations  SNF;Supervision/Assistance - 24 hour    Equipment Recommendations  3 in 1 bedside commode    Recommendations for Other Services       Precautions / Restrictions Precautions Precautions: Fall;Other (comment) Precaution Comments: watch BP- orthostasis Required Braces or Orthoses: Other Brace Other Brace: R ankle brace Restrictions Weight Bearing Restrictions: No Other Position/Activity Restrictions: Assuming WBAT since last hospitalization      Mobility Bed Mobility Overal bed mobility: Needs Assistance Bed Mobility: Supine to Sit     Supine to sit: Min guard;HOB elevated Sit to supine: Supervision   General bed mobility comments: HOB 25 degrees with increased time to transition to EOB with use of rail toward right    Transfers Overall transfer level: Needs assistance   Transfers: Sit to/from Stand;Stand Pivot Transfers Sit to Stand: Min guard Stand pivot transfers: Min guard       General transfer comment:  MinguardA for transfers bed <-> BSC pivot only due to fearfulness of falling    Balance Overall balance assessment: Needs assistance;History of Falls   Sitting balance-Leahy Scale: Fair Sitting balance - Comments: sitting without UE support EOB and at [facility]   Standing balance support: Bilateral upper extremity supported Standing balance-Leahy Scale: Poor Standing balance comment: reliant on UE support for pericare and transfers                           ADL either performed or assessed with clinical judgement   ADL Overall ADL's : Needs assistance/impaired Eating/Feeding: Set up;Sitting;Bed level   Grooming: Set up;Sitting;Bed level   Upper Body Bathing: Minimal assistance;Sitting   Lower Body Bathing: Minimal assistance;Sitting/lateral leans;Bed level   Upper Body Dressing : Minimal assistance;Sitting   Lower Body Dressing: Minimal assistance;Sitting/lateral leans;Sit to/from stand   Toilet Transfer: Minimal assistance;Stand-pivot;RW;BSC   Toileting- Clothing Manipulation and Hygiene: Minimal assistance;Cueing for safety;Sitting/lateral lean;Sit to/from stand       Functional mobility during ADLs: Min guard;Cueing for safety;Cueing for sequencing;Rolling walker General ADL Comments: Pt with +orthostatic vitals thoughout session. pt's daughter in room to conclude session. Pt supine 154/93; sit 117/70; standing 103/67; sitting after exertion 129/68     Vision Baseline Vision/History: No visual deficits Patient Visual Report: No change from baseline Vision Assessment?: No apparent visual deficits     Perception     Praxis      Pertinent Vitals/Pain Pain Assessment: 0-10 Pain Score: 3  Pain Location: R ankle Pain Descriptors / Indicators: Aching Pain Intervention(s): Monitored during session;Repositioned     Hand  Dominance Right   Extremity/Trunk Assessment Upper Extremity Assessment Upper Extremity Assessment: Generalized weakness   Lower Extremity  Assessment Lower Extremity Assessment: Generalized weakness RLE Deficits / Details: pain in R ankle, some swelling noted; arthritic changes negative ankle fx; able to tolerate R ASO   Cervical / Trunk Assessment Cervical / Trunk Assessment: Kyphotic   Communication Communication Communication: HOH   Cognition Arousal/Alertness: Awake/alert Behavior During Therapy: WFL for tasks assessed/performed Overall Cognitive Status: Impaired/Different from baseline Area of Impairment: Memory                     Memory: Decreased short-term memory         General Comments: Pt with memory deficits with no memory of yesterday's session and declining OT.   General Comments  daughter present and wanted vitals and wants answers regarding her mother's low BP/dizziness    Exercises     Shoulder Instructions      Home Living Family/patient expects to be discharged to:: Skilled nursing facility                                 Additional Comments: Pt at SNF since last admission and was alone in ILF prior to that      Prior Functioning/Environment Level of Independence: Needs assistance  Gait / Transfers Assistance Needed: supervision for all transfers due to syncope and BP issues ADL's / Homemaking Assistance Needed: supervision for ADLs with staff assisting or performing iADL            OT Problem List: Decreased strength;Decreased activity tolerance;Impaired balance (sitting and/or standing);Decreased coordination;Decreased safety awareness;Pain;Increased edema;Decreased cognition      OT Treatment/Interventions: Self-care/ADL training;Therapeutic exercise;Neuromuscular education;Energy conservation;Cognitive remediation/compensation;Therapeutic activities;Visual/perceptual remediation/compensation;Balance training    OT Goals(Current goals can be found in the care plan section) Acute Rehab OT Goals Patient Stated Goal: to be able to walk, watch turner  movies OT Goal Formulation: With patient Time For Goal Achievement: 05/02/20 Potential to Achieve Goals: Fair ADL Goals Pt Will Perform Lower Body Dressing: with supervision;sitting/lateral leans;sit to/from stand Pt Will Transfer to Toilet: with supervision;ambulating;bedside commode Additional ADL Goal #1: Pt will increase to modified independence for bed mobility as precursor for ADL Additional ADL Goal #2: Pt will participate in x8 mins OOB ADL with <2 seated rest breaks in order to assess safety and activity tolerance.  OT Frequency: Min 2X/week   Barriers to D/C:            Co-evaluation              AM-PAC OT "6 Clicks" Daily Activity     Outcome Measure Help from another person eating meals?: None Help from another person taking care of personal grooming?: A Little Help from another person toileting, which includes using toliet, bedpan, or urinal?: A Little Help from another person bathing (including washing, rinsing, drying)?: A Little Help from another person to put on and taking off regular upper body clothing?: A Little Help from another person to put on and taking off regular lower body clothing?: A Little 6 Click Score: 19   End of Session Equipment Utilized During Treatment: Gait belt;Rolling walker Nurse Communication: Mobility status  Activity Tolerance: Patient tolerated treatment well Patient left: in bed;with call bell/phone within reach;with bed alarm set  OT Visit Diagnosis: Unsteadiness on feet (R26.81);Muscle weakness (generalized) (M62.81)  Time: 6067-7034 OT Time Calculation (min): 45 min Charges:  OT General Charges $OT Visit: 1 Visit OT Evaluation $OT Eval Moderate Complexity: 1 Mod OT Treatments $Self Care/Home Management : 23-37 mins  Jefferey Pica, OTR/L Acute Rehabilitation Services Pager: [phone] Office: [phone]   [place]  C 04/18/2020, 5:25 PM

## 2020-04-19 ENCOUNTER — Other Ambulatory Visit: Payer: Self-pay | Admitting: Cardiology

## 2020-04-19 DIAGNOSIS — R55 Syncope and collapse: Secondary | ICD-10-CM

## 2020-04-19 LAB — COMPREHENSIVE METABOLIC PANEL
ALT: 18 U/L (ref 0–44)
AST: 26 U/L (ref 15–41)
Albumin: 2.7 g/dL — ABNORMAL LOW (ref 3.5–5.0)
Alkaline Phosphatase: 80 U/L (ref 38–126)
Anion gap: 7 (ref 5–15)
BUN: 20 mg/dL (ref 8–23)
CO2: 23 mmol/L (ref 22–32)
Calcium: 8.5 mg/dL — ABNORMAL LOW (ref 8.9–10.3)
Chloride: 97 mmol/L — ABNORMAL LOW (ref 98–111)
Creatinine, Ser: 0.97 mg/dL (ref 0.44–1.00)
GFR, Estimated: 56 mL/min — ABNORMAL LOW (ref >60)
Glucose, Bld: 132 mg/dL — ABNORMAL HIGH (ref 70–99)
Potassium: 4.1 mmol/L (ref 3.5–5.1)
Sodium: 127 mmol/L — ABNORMAL LOW (ref 135–145)
Total Bilirubin: 0.9 mg/dL (ref 0.3–1.2)
Total Protein: 6.5 g/dL (ref 6.5–8.1)

## 2020-04-19 LAB — GLUCOSE, CAPILLARY
Glucose-Capillary: 104 mg/dL — ABNORMAL HIGH (ref 70–99)
Glucose-Capillary: 107 mg/dL — ABNORMAL HIGH (ref 70–99)
Glucose-Capillary: 109 mg/dL — ABNORMAL HIGH (ref 70–99)
Glucose-Capillary: 124 mg/dL — ABNORMAL HIGH (ref 70–99)

## 2020-04-19 LAB — CBC WITH DIFFERENTIAL/PLATELET
Abs Immature Granulocytes: 0.03 10*3/uL (ref 0.00–0.07)
Basophils Absolute: 0.1 10*3/uL (ref 0.0–0.1)
Basophils Relative: 1 %
Eosinophils Absolute: 0.2 10*3/uL (ref 0.0–0.5)
Eosinophils Relative: 2 %
HCT: 35.1 % — ABNORMAL LOW (ref 36.0–46.0)
Hemoglobin: 11.7 g/dL — ABNORMAL LOW (ref 12.0–15.0)
Immature Granulocytes: 0 %
Lymphocytes Relative: 19 %
Lymphs Abs: 1.5 10*3/uL (ref 0.7–4.0)
MCH: 28.4 pg (ref 26.0–34.0)
MCHC: 33.3 g/dL (ref 30.0–36.0)
MCV: 85.2 fL (ref 80.0–100.0)
Monocytes Absolute: 0.9 10*3/uL (ref 0.1–1.0)
Monocytes Relative: 11 %
Neutro Abs: 5.3 10*3/uL (ref 1.7–7.7)
Neutrophils Relative %: 67 %
Platelets: 316 10*3/uL (ref 150–400)
RBC: 4.12 MIL/uL (ref 3.87–5.11)
RDW: 15.4 % (ref 11.5–15.5)
WBC: 7.9 10*3/uL (ref 4.0–10.5)
nRBC: 0 % (ref 0.0–0.2)

## 2020-04-19 LAB — OSMOLALITY, URINE: Osmolality, Ur: 359 mOsm/kg (ref 300–900)

## 2020-04-19 LAB — SODIUM, URINE, RANDOM: Sodium, Ur: 82 mmol/L

## 2020-04-19 LAB — PHOSPHORUS: Phosphorus: 2.2 mg/dL — ABNORMAL LOW (ref 2.5–4.6)

## 2020-04-19 LAB — SARS CORONAVIRUS 2 (TAT 6-24 HRS): SARS Coronavirus 2: NEGATIVE

## 2020-04-19 LAB — MAGNESIUM: Magnesium: 1.5 mg/dL — ABNORMAL LOW (ref 1.7–2.4)

## 2020-04-19 MED ORDER — MAGNESIUM SULFATE 2 GM/50ML IV SOLN
2.0000 g | Freq: Once | INTRAVENOUS | Status: AC
  Administered 2020-04-19: 2 g via INTRAVENOUS
  Filled 2020-04-19: qty 50

## 2020-04-19 MED ORDER — SODIUM CHLORIDE 1 G PO TABS
1.0000 g | ORAL_TABLET | Freq: Three times a day (TID) | ORAL | Status: DC
  Administered 2020-04-19 – 2020-04-22 (×8): 1 g via ORAL
  Filled 2020-04-19 (×9): qty 1

## 2020-04-19 MED ORDER — SODIUM CHLORIDE 0.9 % IV SOLN: INTRAVENOUS | Status: AC

## 2020-04-19 NOTE — Progress Notes (Signed)
PROGRESS NOTE    [name]  KGM:[id number] DOB: [birth year] DOA: 04/16/2020 PCP: Hulan Fess, MD   Chief Complaint  Patient presents with  . Chest Pain    Brief Narrative:  Jennifer Mould Longmireis a 87 y.o.femalewith medical history significant forhypertension, remote ovarian cancer, chronic hyponatremia who presents to the ED from SNF for evaluation of syncope and chest pain.History is supplemented by daughter at bedside.  Patient was recently admitted from 04/06/2020-04/11/2020 for syncopewith fall, suspected orthostatic versus micturition syncope in etiology+/- Ambien effect.TTE 04/06/2020 showed EF 70-75%, G1 DD, mild AR andAS. Her Ambien, Norvasc,and nadolol werediscontinued, lisinopril was continued on discharge. TED hose and advised to increase oral intake were recommended. She was discharged to SNF.  Daughter states that at her SNF, on the day of presentation, nursing reported that around 2 PM they were assisting her to ambulate to the bathroom when she became lightheaded/dizzy. They sat her on the commode and she developed a transient unresponsive episode. She did not void prior to this unresponsive period.Her blood pressure was reportedly 150s/99. Patient does state that she becomes easily lightheaded/dizzy when changing positions and soon after beginning to walk.  Around 3 PM patient developed a midsternal pressure-like chest discomfort without radiation. She did not have any associated diaphoresis, nausea, vomiting, or dyspnea. Symptoms have since resolved. She reports chronic heartburn/acid reflux related to a hiatal hernia which is notably different than the symptoms she was experiencing. Due to her chest discomfort she was sent to the ED for further evaluation.  Assessment & Plan:   Principal Problem:   Syncope Active Problems:   Hyponatremia   AKI (acute kidney injury) ([place])  Recurrent syncopalepisode with orthostatic hypotension. She has  positive orthostatic vital signs -> SBP drops from 150 to 121 on standing 3/8 Repeat orthostatics daily Thigh high ted hose ordered today (when at SNF, can d/c overnight and place during daytime) Hold BP meds PT assessment recommended SNF. Continue PT OT with assistance and fall precautions. TOC assisting with SNF placement. Persistent orthostasis today, not clearly symptomatic with LH, but unable to stand very long.  Follow with IVF today.   Cardiology consult, appreciate recs - 30 day monitor post d/c with cards, compression stockings, follow orthostatics  Hyponatremia Presume hypovolemic, with weight down, orthostatic hypotension Follow with 500 cc NS follow with IVF, started salt tabs Follow urine sodium/urine osm  Nondisplaced Proximal 3rd and 4th metatarsal fractures Maybe contributing to falls/orthostasis if causing difficulty with ambulation or increasing time in bed due to pain Post op shoe, wbat, PT/OT Can follow with orthopedics outpatient  Follow vitamin D level (wnl)  Concern for esophageal perforation esophogram without evidence of perforation CXR today without acute cardiopulm disease  Aspiration: noted on esophagram, SLP evaluated today.  She denies any symptoms of coughing/choking while eating.  She's declined MBS with speech today.  Follow outpatient  Distal Esophageal Thickening  GERD:  BID PPI She's declined endoscopic evaluation here - consider outpatient GI follow up if "alarm symptoms" Follow with GI prn  Hernias Hiatal hernia and 2 large anterior abdominal wall hernias Follow outpatient with surgery as needed  Resolved chest pain: Patient with transient midsternal pressure-like chest discomfort without radiation. She says this felt different than her reflux symptoms. EKG is reassuring without acute ischemic changes - appears similar to priors. Troponin is weakly positive at 20, trended down to 18. No prior known cardiac history. Diffuse coronary  calcifications and aortic atherosclerosis noted on CTA on admission. Cardiology following, outpatient follow  up  Resolving AKI likely prerenal in the setting of dehydration: Creatinine 1.12 on admission up from 0.85 one week ago. Likely prerenal from hypovolemia. Improving, follow   Hypertension Hold antihypertensives in setting of orthostatic hypotension - likely will have to allow some degree of hypertension due to her orthostatic hypotension which is symptomatic  DVT prophylaxis: lovenox Code Status: full  Family Communication: daughter at bedside Disposition:   Status is: Inpatient  Remains inpatient appropriate because:Inpatient level of care appropriate due to severity of illness   Dispo:  Patient From: [facility]  Planned Disposition: [place]  Medically stable for discharge: No     Consultants:   GERD  cardiology  Procedures:   none  Antimicrobials:  Anti-infectives (From admission, onward)   None     Subjective: No new complaints today  Objective: Vitals:   04/19/20 1047 04/19/20 1051 04/19/20 1053 04/19/20 1620  BP: (!) 176/84 (!) 151/91 102/81 (!) 162/82  Pulse: 88 99 (!) 113   Resp: (!) 22 16 (!) 26   Temp:    99 F (37.2 C)  TempSrc:    Oral  SpO2: 95% 94% 93%   Weight:      Height:        Intake/Output Summary (Last 24 hours) at 04/19/2020 1644 Last data filed at 04/19/2020 1600 Gross per 24 hour  Intake 913.99 ml  Output 1000 ml  Net -86.01 ml   Filed Weights   04/17/20 0100 04/18/20 0615 04/19/20 0500  Weight: 75.9 kg 75.7 kg 74.1 kg    Examination:  General: No acute distress. Cardiovascular: Heart sounds show a regular rate, and rhythm. Lungs: Clear to auscultation bilaterally  Abdomen: Soft, nontender, nondistended  Neurological: Alert and oriented 3. Moves all extremities 4. Cranial nerves II through XII grossly intact. Skin: Warm and dry. No rashes or lesions. Extremities: No clubbing or  cyanosis. No edema.   Data Reviewed: I have personally reviewed following labs and imaging studies  CBC: Recent Labs  Lab 04/16/20 1731 04/17/20 0125 04/19/20 0945  WBC 9.5 9.9 7.9  NEUTROABS  --   --  5.3  HGB 13.1 12.0 11.7*  HCT 40.6 35.3* 35.1*  MCV 86.2 85.3 85.2  PLT 395 326 389    Basic Metabolic Panel: Recent Labs  Lab 04/16/20 1731 04/17/20 0125 04/17/20 1131 04/18/20 0130 04/19/20 0945  NA 126* 129* 129* 130* 127*  K 4.4 4.7 4.3 4.2 4.1  CL 92* 95* 99 100 97*  CO2 23 26 22 24 23   GLUCOSE 135* 129* 125* 83 132*  BUN 26* 27* 22 16 20   CREATININE 1.12* 1.06* 1.02* 0.91 0.97  CALCIUM 9.8 8.9 8.8* 8.6* 8.5*  MG  --  1.8  --   --  1.5*  PHOS  --   --   --   --  2.2*    GFR: Estimated Creatinine Clearance: 38.7 mL/min (by C-G formula based on SCr of 0.97 mg/dL).  Liver Function Tests: Recent Labs  Lab 04/19/20 0945  AST 26  ALT 18  ALKPHOS 80  BILITOT 0.9  PROT 6.5  ALBUMIN 2.7*    CBG: Recent Labs  Lab 04/18/20 1110 04/18/20 1633 04/18/20 2359 04/19/20 0601 04/19/20 1200  GLUCAP 129* 133* 107* 104* 109*     Recent Results (from the past 240 hour(s))  SARS CORONAVIRUS 2 (TAT 6-24 HRS)     Status: None   Collection Time: 04/10/20 12:15 PM  Result Value Ref Range Status  SARS Coronavirus 2 NEGATIVE NEGATIVE Final    Comment: (NOTE) SARS-CoV-2 target nucleic acids are NOT DETECTED.  The SARS-CoV-2 RNA is generally detectable in upper and lower respiratory specimens during the acute phase of infection. Negative results do not preclude SARS-CoV-2 infection, do not rule out co-infections with other pathogens, and should not be used as the sole basis for treatment or other patient management decisions. Negative results must be combined with clinical observations, patient history, and epidemiological information. The expected result is Negative.  Fact Sheet for Patients: SugarRoll.be  Fact Sheet for  Healthcare Providers: https://www.woods-mathews.com/  This test is not yet approved or cleared by the [place] FDA and  has been authorized for detection and/or diagnosis of SARS-CoV-2 by FDA under an Emergency Use Authorization (EUA). This EUA will remain  in effect (meaning this test can be used) for the duration of the COVID-19 declaration under Se ction 564(b)(1) of the Act, 21 U.S.C. section 360bbb-3(b)(1), unless the authorization is terminated or revoked sooner.  Performed at [facility], [place] [address] [place], [place] [address]   SARS CORONAVIRUS 2 (TAT 6-24 HRS) Nasopharyngeal Nasopharyngeal Swab     Status: None   Collection Time: 04/16/20  8:56 PM   Specimen: Nasopharyngeal Swab  Result Value Ref Range Status   SARS Coronavirus 2 NEGATIVE NEGATIVE Final    Comment: (NOTE) SARS-CoV-2 target nucleic acids are NOT DETECTED.  The SARS-CoV-2 RNA is generally detectable in upper and lower respiratory specimens during the acute phase of infection. Negative results do not preclude SARS-CoV-2 infection, do not rule out co-infections with other pathogens, and should not be used as the sole basis for treatment or other patient management decisions. Negative results must be combined with clinical observations, patient history, and epidemiological information. The expected result is Negative.  Fact Sheet for Patients: SugarRoll.be  Fact Sheet for Healthcare Providers: https://www.woods-mathews.com/  This test is not yet approved or cleared by the [place] FDA and  has been authorized for detection and/or diagnosis of SARS-CoV-2 by FDA under an Emergency Use Authorization (EUA). This EUA will remain  in effect (meaning this test can be used) for the duration of the COVID-19 declaration under Se ction 564(b)(1) of the Act, 21 U.S.C. section 360bbb-3(b)(1), unless the authorization is terminated or revoked  sooner.  Performed at [facility], [place] [address] [place], [place] [address]   Culture, blood (routine x 2)     Status: None (Preliminary result)   Collection Time: 04/16/20  9:10 PM   Specimen: BLOOD  Result Value Ref Range Status   Specimen Description BLOOD SITE NOT SPECIFIED  Final   Special Requests   Final    BOTTLES DRAWN AEROBIC AND ANAEROBIC Blood Culture adequate volume   Culture   Final    NO GROWTH 3 DAYS Performed at [facility], [address] [place], [place] [address]    Report Status PENDING  Incomplete  Culture, blood (routine x 2)     Status: None (Preliminary result)   Collection Time: 04/16/20  9:11 PM   Specimen: BLOOD  Result Value Ref Range Status   Specimen Description BLOOD SITE NOT SPECIFIED  Final   Special Requests   Final    BOTTLES DRAWN AEROBIC AND ANAEROBIC Blood Culture adequate volume   Culture   Final    NO GROWTH 3 DAYS Performed at [facility], [address] [place], [place] [address]    Report Status PENDING  Incomplete  MRSA PCR Screening     Status: None   Collection Time: 04/17/20  2:08 AM   Specimen: Nasal Mucosa; Nasopharyngeal  Result Value Ref Range Status   MRSA by PCR NEGATIVE NEGATIVE Final    Comment:        The GeneXpert MRSA Assay (FDA approved for NASAL specimens only), is one component of a comprehensive MRSA colonization surveillance program. It is not intended to diagnose MRSA infection nor to guide or monitor treatment for MRSA infections. Performed at [facility], [place] [address], [place], [place] [address]   SARS CORONAVIRUS 2 (TAT 6-24 HRS) Nasopharyngeal     Status: None   Collection Time: 04/19/20  9:50 AM   Specimen: Nasopharyngeal  Result Value Ref Range Status   SARS Coronavirus 2 NEGATIVE NEGATIVE Final    Comment: (NOTE) SARS-CoV-2 target nucleic acids are NOT DETECTED.  The SARS-CoV-2 RNA is generally detectable in upper and lower respiratory specimens during the  acute phase of infection. Negative results do not preclude SARS-CoV-2 infection, do not rule out co-infections with other pathogens, and should not be used as the sole basis for treatment or other patient management decisions. Negative results must be combined with clinical observations, patient history, and epidemiological information. The expected result is Negative.  Fact Sheet for Patients: SugarRoll.be  Fact Sheet for Healthcare Providers: https://www.woods-mathews.com/  This test is not yet approved or cleared by the [place] FDA and  has been authorized for detection and/or diagnosis of SARS-CoV-2 by FDA under an Emergency Use Authorization (EUA). This EUA will remain  in effect (meaning this test can be used) for the duration of the COVID-19 declaration under Se ction 564(b)(1) of the Act, 21 U.S.C. section 360bbb-3(b)(1), unless the authorization is terminated or revoked sooner.  Performed at [facility], [place] [address] [place], [place] [address]          Radiology Studies: DG CHEST PORT 1 VIEW  Result Date: 04/18/2020 CLINICAL DATA:  History of prior fall.  History of chest pain. EXAM: PORTABLE CHEST 1 VIEW COMPARISON:  CT 04/16/2020.  Chest x-ray 04/16/2020. FINDINGS: Mediastinum hilar structures normal. Heart size normal. No focal infiltrate. No pleural effusion or pneumothorax. Prominent skin folds noted. Degenerative change thoracic spine. IMPRESSION: No acute cardiopulmonary disease.  Chest is stable from prior exam. Electronically Signed   By: Marcello Moores  Register   On: 04/18/2020 07:39   DG Foot 2 Views Right  Result Date: 04/18/2020 CLINICAL DATA:  Pain following recent fall EXAM: RIGHT FOOT - 2 VIEW COMPARISON:  March 19, 2020. FINDINGS: Frontal and lateral views obtained. Subtle transverse lucencies in the proximal third and fourth metatarsals are stable, concerning for nondisplaced fractures. Evidence of old  trauma with remodeling distal fourth metatarsal. No evidence of new fracture compared to prior study. No dislocation. There is underlying osteoporosis. No appreciable joint space narrowing. There is an inferior calcaneal spur. There is mild spurring in the dorsal midfoot. Foci of arterial vascular calcification noted. IMPRESSION: Persistent subtle transverse lucencies in the proximal third and proximal fourth metatarsals, unchanged from prior study and likely representing subtle nondisplaced fractures in these areas. Old healed fracture distal fourth metatarsal with remodeling. No acute fracture compared to prior study. No dislocation. Mild spurring dorsal midfoot. Inferior calcaneal spur. Foci of arterial vascular atherosclerotic calcification noted. Electronically Signed   By: Lowella Grip III M.D.   On: 04/18/2020 11:16   DG ESOPHAGUS W SINGLE CM (SOL OR THIN BA)  Result Date: 04/17/2020 CLINICAL DATA:  Possible distal esophageal perforation near the GE junction. EXAM: ESOPHOGRAM/BARIUM SWALLOW TECHNIQUE: Single contrast examination was performed using water-soluble contrast. FLUOROSCOPY TIME:  Fluoroscopy Time:  1.7 minutes Radiation Exposure Index (if provided by the fluoroscopic device): 21.5 mGy Number of Acquired Spot Images: 0 COMPARISON:  CT abdomen pelvis from same day. Esophagram dated May 09, 2016. FINDINGS: Normal esophageal course and contour. No obstruction, stricture, or perforation. Unchanged small hiatal hernia. The procedure was terminated after the patient aspirated a small amount of contrast. IMPRESSION: 1. No evidence of esophageal perforation. 2. Positive for aspiration. Consider modified barium swallow study for further evaluation. Electronically Signed   By: Titus Dubin M.D.   On: 04/17/2020 19:05        Scheduled Meds: . docusate sodium  100 mg Oral Daily  . enoxaparin (LOVENOX) injection  40 mg Subcutaneous Q24H  . melatonin  3 mg Oral QHS  . multivitamin  1  tablet Oral Daily  . pantoprazole (PROTONIX) IV  40 mg Intravenous Q12H  . polyethylene glycol  17 g Oral Daily  . sodium chloride flush  3 mL Intravenous Q12H  . sodium chloride  1 g Oral TID WC   Continuous Infusions: . sodium chloride 100 mL/hr at 04/19/20 1600  . magnesium sulfate bolus IVPB       LOS: 2 days    Time spent: over 30 min    Fayrene Helper, MD [facility]   To contact the attending provider between 7A-7P or the covering provider during after hours 7P-7A, please log into the web site [email or web address] and access using universal [place] password for that web site. If you do not have the password, please call the hospital operator.  04/19/2020, 4:44 PM

## 2020-04-19 NOTE — Progress Notes (Signed)
    Orders placed for outpatient cardiac monitor for syncope. Dr. Gardiner Rhyme PCP cardiologist to read. Will arrange for outpatient follow up in the office. Cardiology will sign off. Please call with questions.   SignedReino Bellis, NP-C 04/19/2020, 9:49 AM

## 2020-04-19 NOTE — Plan of Care (Signed)
  Problem: Education: Goal: Knowledge of General Education information will improve Description: Including pain rating scale, medication(s)/side effects and non-pharmacologic comfort measures Outcome: Progressing   Problem: Clinical Measurements: Goal: Ability to maintain clinical measurements within normal limits will improve Outcome: Progressing Goal: Will remain free from infection Outcome: Progressing Goal: Diagnostic test results will improve Outcome: Progressing Goal: Respiratory complications will improve Outcome: Progressing   Problem: Nutrition: Goal: Adequate nutrition will be maintained Outcome: Progressing   Problem: Coping: Goal: Level of anxiety will decrease Outcome: Progressing   

## 2020-04-20 DIAGNOSIS — R55 Syncope and collapse: Secondary | ICD-10-CM | POA: Diagnosis not present

## 2020-04-20 LAB — COMPREHENSIVE METABOLIC PANEL
ALT: 18 U/L (ref 0–44)
AST: 23 U/L (ref 15–41)
Albumin: 2.5 g/dL — ABNORMAL LOW (ref 3.5–5.0)
Alkaline Phosphatase: 75 U/L (ref 38–126)
Anion gap: 5 (ref 5–15)
BUN: 21 mg/dL (ref 8–23)
CO2: 24 mmol/L (ref 22–32)
Calcium: 8.5 mg/dL — ABNORMAL LOW (ref 8.9–10.3)
Chloride: 99 mmol/L (ref 98–111)
Creatinine, Ser: 0.94 mg/dL (ref 0.44–1.00)
GFR, Estimated: 58 mL/min — ABNORMAL LOW (ref >60)
Glucose, Bld: 104 mg/dL — ABNORMAL HIGH (ref 70–99)
Potassium: 4.4 mmol/L (ref 3.5–5.1)
Sodium: 128 mmol/L — ABNORMAL LOW (ref 135–145)
Total Bilirubin: 0.6 mg/dL (ref 0.3–1.2)
Total Protein: 6.1 g/dL — ABNORMAL LOW (ref 6.5–8.1)

## 2020-04-20 LAB — CBC WITH DIFFERENTIAL/PLATELET
Abs Immature Granulocytes: 0.02 10*3/uL (ref 0.00–0.07)
Basophils Absolute: 0.1 10*3/uL (ref 0.0–0.1)
Basophils Relative: 1 %
Eosinophils Absolute: 0.3 10*3/uL (ref 0.0–0.5)
Eosinophils Relative: 4 %
HCT: 32.4 % — ABNORMAL LOW (ref 36.0–46.0)
Hemoglobin: 10.7 g/dL — ABNORMAL LOW (ref 12.0–15.0)
Immature Granulocytes: 0 %
Lymphocytes Relative: 27 %
Lymphs Abs: 1.9 10*3/uL (ref 0.7–4.0)
MCH: 28.2 pg (ref 26.0–34.0)
MCHC: 33 g/dL (ref 30.0–36.0)
MCV: 85.3 fL (ref 80.0–100.0)
Monocytes Absolute: 0.8 10*3/uL (ref 0.1–1.0)
Monocytes Relative: 11 %
Neutro Abs: 4 10*3/uL (ref 1.7–7.7)
Neutrophils Relative %: 57 %
Platelets: 306 10*3/uL (ref 150–400)
RBC: 3.8 MIL/uL — ABNORMAL LOW (ref 3.87–5.11)
RDW: 15.3 % (ref 11.5–15.5)
WBC: 7 10*3/uL (ref 4.0–10.5)
nRBC: 0 % (ref 0.0–0.2)

## 2020-04-20 LAB — MAGNESIUM: Magnesium: 2.1 mg/dL (ref 1.7–2.4)

## 2020-04-20 LAB — GLUCOSE, CAPILLARY
Glucose-Capillary: 104 mg/dL — ABNORMAL HIGH (ref 70–99)
Glucose-Capillary: 110 mg/dL — ABNORMAL HIGH (ref 70–99)

## 2020-04-20 LAB — PHOSPHORUS: Phosphorus: 2.9 mg/dL (ref 2.5–4.6)

## 2020-04-20 MED ORDER — SODIUM CHLORIDE 0.9 % IV BOLUS: 1000.0000 mL | Freq: Once | INTRAVENOUS | Status: DC

## 2020-04-20 MED ORDER — SODIUM CHLORIDE 0.9 % IV BOLUS
500.0000 mL | Freq: Once | INTRAVENOUS | Status: AC
  Administered 2020-04-20: 500 mL via INTRAVENOUS

## 2020-04-20 NOTE — Progress Notes (Signed)
Physical Therapy Treatment Patient Details Name: [name]  MRN: [id number] DOB: 06/17/1929 Today's Date: 04/20/2020    History of Present Illness Pt is an 87 y/o female admitted 3/7 following syncopal episode at [facility]. Pt with admission 2/25-3/2 for syncope with fall. PMH includes ovarian CA; HTN, HLD, chronic hyponatremia, insomnia    PT Comments    Pt sitting on arrival stating that she can't stand due to BP. Pt educated for benefit of HEP prior to mobilizing to attempt to increase tolerance for standing. Pt with bil TED hose on, ankle brace and post op shoe right with seated bP 152/86 (105) with pt able to stand 1 min with drop to 124/78 (93) with pt stating need to sit with fatigue but no lightheadedness. Pt then deferred further activity as lunch arrived and pt stated imperative need to have her soup. Will continue to follow with transition of goals to [facility] mobility level and HEP with continued attempts for standing tolerance.   HR 101 seated to 120 standing   Follow Up Recommendations  SNF;Supervision for mobility/OOB     Equipment Recommendations       Recommendations for Other Services       Precautions / Restrictions Precautions Precautions: Fall;Other (comment) Precaution Comments: watch BP- orthostasis Required Braces or Orthoses: Other Brace Other Brace: R ankle brace and ortho shoe Restrictions Weight Bearing Restrictions: Yes RLE Weight Bearing: Weight bearing as tolerated    Mobility  Bed Mobility               General bed mobility comments: in recliner on arrival    Transfers Overall transfer level: Needs assistance   Transfers: Sit to/from Stand Sit to Stand: Min guard         General transfer comment: guarding with cues for hand placement to stand from recliner grossly 1 min limited by presyncope and return to chair  Ambulation/Gait             General Gait Details: unable due to orthostatic BP   Stairs              Wheelchair Mobility    Modified Rankin (Stroke Patients Only)       Balance Overall balance assessment: Needs assistance;History of Falls         Standing balance support: Bilateral upper extremity supported Standing balance-Leahy Scale: Poor Standing balance comment: bil UE on RW for standing                            Cognition Arousal/Alertness: Awake/alert Behavior During Therapy: WFL for tasks assessed/performed Overall Cognitive Status: Impaired/Different from baseline Area of Impairment: Memory;Problem solving                     Memory: Decreased short-term memory       Problem Solving: Slow processing General Comments: repeats statements and questions.      Exercises General Exercises - Upper Extremity Shoulder Flexion: AROM;10 reps;Both;Seated General Exercises - Lower Extremity Long Arc Quad: AROM;Both;Seated;15 reps Hip Flexion/Marching: AROM;Both;Seated;15 reps    General Comments        Pertinent Vitals/Pain Pain Assessment: No/denies pain    Home Living                      Prior Function            PT Goals (current goals can now be found in the care  plan section) Progress towards PT goals: Progressing toward goals    Frequency    Min 2X/week      PT Plan Current plan remains appropriate    Co-evaluation              AM-PAC PT "6 Clicks" Mobility   Outcome Measure  Help needed turning from your back to your side while in a flat bed without using bedrails?: A Little Help needed moving from lying on your back to sitting on the side of a flat bed without using bedrails?: A Little Help needed moving to and from a bed to a chair (including a wheelchair)?: A Little Help needed standing up from a chair using your arms (e.g., wheelchair or bedside chair)?: A Little Help needed to walk in hospital room?: Total Help needed climbing 3-5 steps with a railing? : Total 6 Click Score: 14    End of  Session   Activity Tolerance: Treatment limited secondary to medical complications (Comment) (orthostatic BP) Patient left: in chair;with call bell/phone within reach;with chair alarm set Nurse Communication: Mobility status;Precautions PT Visit Diagnosis: Unsteadiness on feet (R26.81);Muscle weakness (generalized) (M62.81);Difficulty in walking, not elsewhere classified (R26.2)     Time: 8144-8185 PT Time Calculation (min) (ACUTE ONLY): 19 min  Charges:  $Therapeutic Activity: 8-22 mins                     [name] P, PT Acute Rehabilitation Services Pager: [phone] Office: [phone]    [name] [name] 04/20/2020, 3:53 PM

## 2020-04-20 NOTE — Consult Note (Signed)
   [facility] CM Inpatient Consult   04/20/2020  MCKINNLEY COTTIER 1930/01/08 [id number]   [facility] [ACO] Patient: Medicare DCE  PCP: Hulan Fess, MD, [facility] Physician, this provider's office is listed to provide the Transition Of Care [TOC] follow up  Patient screened for hospitalization with noted high risk score for unplanned readmission risk and with less than 7 days readmission. Electronic Medical record reviewed for post hospital transition needs.  Review of patient's medical record reveals patient is being recommended for a skilled nursing facility level of care. Per inpatient [facility] team notes patient is from [facility] prior to admission.  Plan:  No [facility] Care Management needs assess for TOC as the patient needs are to be met currently at a skilled nursing facility level of care.    For questions contact:   Natividad Brood, RN BSN [facility] Liaison  [phone] business mobile phone Toll free office [phone]  Fax number: [phone] [email or web address]

## 2020-04-20 NOTE — Progress Notes (Signed)
Occupational Therapy Treatment Patient Details Name: [name] MRN: [id number] DOB: September 09, 1929 Today's Date: 04/20/2020    History of present illness Pt is an 87 y/o female admitted 3/7 following syncopal episode at [facility]. Pt with admission 2/25-3/2 for syncope with fall. PMH includes ovarian CA; HTN, HLD, chronic hyponatremia, insomnia   OT comments  Patient continues to struggle with dizziness with movement.  SBP are running a little high, and dizziness noted with supine to sit and with stand: supine 151, then jumped to 171 with quick stand and sit due to dizziness.  OT will continue to follow in the acute setting to maximize safety and functional status.  SNF is appropriate.    Follow Up Recommendations  SNF;Supervision/Assistance - 24 hour    Equipment Recommendations  3 in 1 bedside commode    Recommendations for Other Services      Precautions / Restrictions Precautions Precautions: Fall;Other (comment) Precaution Comments: watch BP- orthostasis Required Braces or Orthoses: Other Brace Other Brace: R ankle brace and ortho shoe       Mobility Bed Mobility Overal bed mobility: Needs Assistance Bed Mobility: Supine to Sit;Sit to Supine     Supine to sit: Min guard Sit to supine: Min assist     Patient Response: Anxious  Transfers Overall transfer level: Needs assistance   Transfers: Sit to/from [employer] Transfers Sit to Stand: Min guard Stand pivot transfers: Min assist            Balance   Sitting-balance support: No upper extremity supported Sitting balance-Leahy Scale: Fair     Standing balance support: Bilateral upper extremity supported Standing balance-Leahy Scale: Poor                             ADL either performed or assessed with clinical judgement   ADL   Eating/Feeding: Set up;Sitting;Bed level   Grooming: Set up;Sitting;Wash/dry hands;Wash/dry face                               Functional  mobility during ADLs: Cueing for safety;Cueing for sequencing;Rolling walker;Minimal assistance General ADL Comments: continues with dizziness                       Cognition Arousal/Alertness: Awake/alert Behavior During Therapy: WFL for tasks assessed/performed Overall Cognitive Status: Impaired/Different from baseline Area of Impairment: Memory                     Memory: Decreased short-term memory         General Comments: repeats statements and asks same questions.  Is HOH and that may be complicating things                          Pertinent Vitals/ Pain       Pain Assessment: No/denies pain Pain Intervention(s): Monitored during session                                                          Frequency  Min 2X/week        Progress Toward Goals  OT Goals(current goals can now be found in the care plan  section)  Progress towards OT goals: Progressing toward goals  Acute Rehab OT Goals Patient Stated Goal: I need to gain my confidence OT Goal Formulation: With patient Time For Goal Achievement: 05/02/20 Potential to Achieve Goals: Hope Discharge plan remains appropriate    Co-evaluation                 AM-PAC OT "6 Clicks" Daily Activity     Outcome Measure   Help from another person eating meals?: None Help from another person taking care of personal grooming?: A Little Help from another person toileting, which includes using toliet, bedpan, or urinal?: A Little Help from another person bathing (including washing, rinsing, drying)?: A Little Help from another person to put on and taking off regular upper body clothing?: A Little Help from another person to put on and taking off regular lower body clothing?: A Little 6 Click Score: 19    End of Session    OT Visit Diagnosis: Unsteadiness on feet (R26.81);Muscle weakness (generalized) (M62.81);Dizziness and giddiness (R42)   Activity  Tolerance Patient tolerated treatment well   Patient Left with call bell/phone within reach;with chair alarm set   Nurse Communication Mobility status        Time: 6659-9357 OT Time Calculation (min): 30 min  Charges: OT General Charges $OT Visit: 1 Visit OT Treatments $Self Care/Home Management : 8-22 mins $Therapeutic Activity: 8-22 mins  04/20/2020  Rich, OTR/L  Acute Rehabilitation Services  Office:  [phone]    Metta Clines 04/20/2020, 9:56 AM

## 2020-04-20 NOTE — Progress Notes (Addendum)
PROGRESS NOTE    [name]  VEH:[id number] DOB: 01-20-1930 DOA: 04/16/2020 PCP: Hulan Fess, MD   Chief Complaint  Patient presents with  . Chest Pain    Brief Narrative:  Jennifer Stegeman Longmireis a 87 y.o.femalewith medical history significant forhypertension, remote ovarian cancer, chronic hyponatremia who presents to the ED from SNF for evaluation of syncope and chest pain.History is supplemented by daughter at bedside.  Patient was recently admitted from 04/06/2020-04/11/2020 for syncopewith fall, suspected orthostatic versus micturition syncope in etiology+/- Ambien effect.TTE 04/06/2020 showed EF 70-75%, G1 DD, mild AR andAS. Her Ambien, Norvasc,and nadolol werediscontinued, lisinopril was continued on discharge. TED hose and advised to increase oral intake were recommended. She was discharged to SNF.  Daughter states that at her SNF, on the day of presentation, nursing reported that around 2 PM they were assisting her to ambulate to the bathroom when she became lightheaded/dizzy. They sat her on the commode and she developed a transient unresponsive episode. She did not void prior to this unresponsive period.Her blood pressure was reportedly 150s/99. Patient does state that she becomes easily lightheaded/dizzy when changing positions and soon after beginning to walk.  Around 3 PM patient developed a midsternal pressure-like chest discomfort without radiation. She did not have any associated diaphoresis, nausea, vomiting, or dyspnea. Symptoms have since resolved. She reports chronic heartburn/acid reflux related to a hiatal hernia which is notably different than the symptoms she was experiencing. Due to her chest discomfort she was sent to the ED for further evaluation.  Assessment & Plan:   Principal Problem:   Syncope Active Problems:   Hyponatremia   AKI (acute kidney injury) ([place])  Recurrent syncopalepisode with orthostatic hypotension. She has  positive orthostatic vital signs -> SBP drops from 150 to 121 on standing 3/8 Repeat orthostatics daily - not able to stand 3 min due to lightheadedness/weakness today Thigh high ted hose, abdominal binder (when at SNF, can d/c overnight and place during daytime) Follow with additional IVF Hold BP meds PT assessment recommended SNF. Continue PT OT with assistance and fall precautions. TOC assisting with SNF placement. Cardiology consult, appreciate recs - 30 day monitor post d/c with cards, compression stockings, follow orthostatics  Hyponatremia Presumed component of hypovolemia, with weight down, orthostatic hypotension - though SIADH is possible especially in setting of chronicity and urine sodium/osm's Continue salt tabs - will hold off on fluid restriction  Follow with additional bolus  Nondisplaced Proximal 3rd and 4th metatarsal fractures Maybe contributing to falls/orthostasis if causing difficulty with ambulation or increasing time in bed due to pain Post op shoe, wbat, PT/OT Can follow with orthopedics outpatient  Follow vitamin D level (wnl)  Concern for esophageal perforation esophogram without evidence of perforation CXR today without acute cardiopulm disease  Aspiration: noted on esophagram, SLP evaluated today.  She denies any symptoms of coughing/choking while eating.  She's declined MBS with speech today.  Follow outpatient  Distal Esophageal Thickening  GERD:  BID PPI She's declined endoscopic evaluation here - consider outpatient GI follow up if "alarm symptoms" Follow with GI prn  Hernias Hiatal hernia and 2 large anterior abdominal wall hernias Follow outpatient with surgery as needed  Resolved chest pain: Patient with transient midsternal pressure-like chest discomfort without radiation. She says this felt different than her reflux symptoms. EKG is reassuring without acute ischemic changes - appears similar to priors. Troponin is weakly positive at 20,  trended down to 18. No prior known cardiac history. Diffuse coronary calcifications and aortic atherosclerosis  noted on CTA on admission. Cardiology following, outpatient follow up  Resolving AKI likely prerenal in the setting of dehydration: Creatinine 1.12 on admission up from 0.85 one week ago. Likely prerenal from hypovolemia. Improving, follow   Hypertension Hold antihypertensives in setting of orthostatic hypotension - likely will have to allow some degree of hypertension due to her orthostatic hypotension which is symptomatic  DVT prophylaxis: lovenox Code Status: full  Family Communication: daughter at over phone 3/11 Disposition:   Status is: Inpatient  Remains inpatient appropriate because:Inpatient level of care appropriate due to severity of illness   Dispo:  Patient From: [place]  Planned Disposition: [place]  Medically stable for discharge: No     Consultants:   GERD  cardiology  Procedures:   none  Antimicrobials:  Anti-infectives (From admission, onward)   None     Subjective: No new complaints today  Objective: Vitals:   04/19/20 1952 04/20/20 0046 04/20/20 0447 04/20/20 0731  BP: 140/67 123/70    Pulse: 80 81  86  Resp: 19 (!) 22  19  Temp: 98.1 F (36.7 C)   98.7 F (37.1 C)  TempSrc: Oral   Oral  SpO2: 96% 91%    Weight:   76.1 kg   Height:        Intake/Output Summary (Last 24 hours) at 04/20/2020 0936 Last data filed at 04/19/2020 1800 Gross per 24 hour  Intake 626 ml  Output -  Net 626 ml   Filed Weights   04/18/20 0615 04/19/20 0500 04/20/20 0447  Weight: 75.7 kg 74.1 kg 76.1 kg    Examination:  General: No acute distress. Cardiovascular: Heart sounds show a regular rate, and rhythm.  Lungs: Clear to auscultation bilaterally  Abdomen: Soft, nontender, nondistended Neurological: Alert and oriented 3. Moves all extremities 4. Cranial nerves II through XII grossly intact. Skin:  Warm and dry. No rashes or lesions. Extremities: No clubbing or cyanosis. No edema  Data Reviewed: I have personally reviewed following labs and imaging studies  CBC: Recent Labs  Lab 04/16/20 1731 04/17/20 0125 04/19/20 0945 04/20/20 0024  WBC 9.5 9.9 7.9 7.0  NEUTROABS  --   --  5.3 4.0  HGB 13.1 12.0 11.7* 10.7*  HCT 40.6 35.3* 35.1* 32.4*  MCV 86.2 85.3 85.2 85.3  PLT 395 326 316 633    Basic Metabolic Panel: Recent Labs  Lab 04/17/20 0125 04/17/20 1131 04/18/20 0130 04/19/20 0945 04/20/20 0024  NA 129* 129* 130* 127* 128*  K 4.7 4.3 4.2 4.1 4.4  CL 95* 99 100 97* 99  CO2 26 22 24 23 24   GLUCOSE 129* 125* 83 132* 104*  BUN 27* 22 16 20 21   CREATININE 1.06* 1.02* 0.91 0.97 0.94  CALCIUM 8.9 8.8* 8.6* 8.5* 8.5*  MG 1.8  --   --  1.5* 2.1  PHOS  --   --   --  2.2* 2.9    GFR: Estimated Creatinine Clearance: 40.4 mL/min (by C-G formula based on SCr of 0.94 mg/dL).  Liver Function Tests: Recent Labs  Lab 04/19/20 0945 04/20/20 0024  AST 26 23  ALT 18 18  ALKPHOS 80 75  BILITOT 0.9 0.6  PROT 6.5 6.1*  ALBUMIN 2.7* 2.5*    CBG: Recent Labs  Lab 04/19/20 0601 04/19/20 1200 04/19/20 1859 04/20/20 0148 04/20/20 0616  GLUCAP 104* 109* 124* 104* 110*     Recent Results (from the past 240 hour(s))  SARS CORONAVIRUS 2 (TAT 6-24 HRS)  Status: None   Collection Time: 04/10/20 12:15 PM  Result Value Ref Range Status   SARS Coronavirus 2 NEGATIVE NEGATIVE Final    Comment: (NOTE) SARS-CoV-2 target nucleic acids are NOT DETECTED.  The SARS-CoV-2 RNA is generally detectable in upper and lower respiratory specimens during the acute phase of infection. Negative results do not preclude SARS-CoV-2 infection, do not rule out co-infections with other pathogens, and should not be used as the sole basis for treatment or other patient management decisions. Negative results must be combined with clinical observations, patient history, and epidemiological  information. The expected result is Negative.  Fact Sheet for Patients: SugarRoll.be  Fact Sheet for Healthcare Providers: https://www.woods-mathews.com/  This test is not yet approved or cleared by the [place] FDA and  has been authorized for detection and/or diagnosis of SARS-CoV-2 by FDA under an Emergency Use Authorization (EUA). This EUA will remain  in effect (meaning this test can be used) for the duration of the COVID-19 declaration under Se ction 564(b)(1) of the Act, 21 U.S.C. section 360bbb-3(b)(1), unless the authorization is terminated or revoked sooner.  Performed at [facility], [place] [address], [place], [place] [address]   SARS CORONAVIRUS 2 (TAT 6-24 HRS) Nasopharyngeal Nasopharyngeal Swab     Status: None   Collection Time: 04/16/20  8:56 PM   Specimen: Nasopharyngeal Swab  Result Value Ref Range Status   SARS Coronavirus 2 NEGATIVE NEGATIVE Final    Comment: (NOTE) SARS-CoV-2 target nucleic acids are NOT DETECTED.  The SARS-CoV-2 RNA is generally detectable in upper and lower respiratory specimens during the acute phase of infection. Negative results do not preclude SARS-CoV-2 infection, do not rule out co-infections with other pathogens, and should not be used as the sole basis for treatment or other patient management decisions. Negative results must be combined with clinical observations, patient history, and epidemiological information. The expected result is Negative.  Fact Sheet for Patients: SugarRoll.be  Fact Sheet for Healthcare Providers: https://www.woods-mathews.com/  This test is not yet approved or cleared by the [place] FDA and  has been authorized for detection and/or diagnosis of SARS-CoV-2 by FDA under an Emergency Use Authorization (EUA). This EUA will remain  in effect (meaning this test can be used) for the duration of the COVID-19  declaration under Se ction 564(b)(1) of the Act, 21 U.S.C. section 360bbb-3(b)(1), unless the authorization is terminated or revoked sooner.  Performed at [facility], [place] [address], [place], [place] [address]   Culture, blood (routine x 2)     Status: None (Preliminary result)   Collection Time: 04/16/20  9:10 PM   Specimen: BLOOD  Result Value Ref Range Status   Specimen Description BLOOD SITE NOT SPECIFIED  Final   Special Requests   Final    BOTTLES DRAWN AEROBIC AND ANAEROBIC Blood Culture adequate volume   Culture   Final    NO GROWTH 3 DAYS Performed at [facility], [address], [place], [place] [address]    Report Status PENDING  Incomplete  Culture, blood (routine x 2)     Status: None (Preliminary result)   Collection Time: 04/16/20  9:11 PM   Specimen: BLOOD  Result Value Ref Range Status   Specimen Description BLOOD SITE NOT SPECIFIED  Final   Special Requests   Final    BOTTLES DRAWN AEROBIC AND ANAEROBIC Blood Culture adequate volume   Culture   Final    NO GROWTH 3 DAYS Performed at [facility], [place] [address], [place], [place] [address]    Report Status PENDING  Incomplete  MRSA PCR Screening     Status: None   Collection Time: 04/17/20  2:08 AM   Specimen: Nasal Mucosa; Nasopharyngeal  Result Value Ref Range Status   MRSA by PCR NEGATIVE NEGATIVE Final    Comment:        The GeneXpert MRSA Assay (FDA approved for NASAL specimens only), is one component of a comprehensive MRSA colonization surveillance program. It is not intended to diagnose MRSA infection nor to guide or monitor treatment for MRSA infections. Performed at [facility], [place] [address], [place], [place] [address]   SARS CORONAVIRUS 2 (TAT 6-24 HRS) Nasopharyngeal     Status: None   Collection Time: 04/19/20  9:50 AM   Specimen: Nasopharyngeal  Result Value Ref Range Status   SARS Coronavirus 2 NEGATIVE NEGATIVE Final    Comment: (NOTE) SARS-CoV-2  target nucleic acids are NOT DETECTED.  The SARS-CoV-2 RNA is generally detectable in upper and lower respiratory specimens during the acute phase of infection. Negative results do not preclude SARS-CoV-2 infection, do not rule out co-infections with other pathogens, and should not be used as the sole basis for treatment or other patient management decisions. Negative results must be combined with clinical observations, patient history, and epidemiological information. The expected result is Negative.  Fact Sheet for Patients: SugarRoll.be  Fact Sheet for Healthcare Providers: https://www.woods-mathews.com/  This test is not yet approved or cleared by the [place] FDA and  has been authorized for detection and/or diagnosis of SARS-CoV-2 by FDA under an Emergency Use Authorization (EUA). This EUA will remain  in effect (meaning this test can be used) for the duration of the COVID-19 declaration under Se ction 564(b)(1) of the Act, 21 U.S.C. section 360bbb-3(b)(1), unless the authorization is terminated or revoked sooner.  Performed at [facility], [place] [address] [place], [place] [address]          Radiology Studies: DG Foot 2 Views Right  Result Date: 04/18/2020 CLINICAL DATA:  Pain following recent fall EXAM: RIGHT FOOT - 2 VIEW COMPARISON:  March 19, 2020. FINDINGS: Frontal and lateral views obtained. Subtle transverse lucencies in the proximal third and fourth metatarsals are stable, concerning for nondisplaced fractures. Evidence of old trauma with remodeling distal fourth metatarsal. No evidence of new fracture compared to prior study. No dislocation. There is underlying osteoporosis. No appreciable joint space narrowing. There is an inferior calcaneal spur. There is mild spurring in the dorsal midfoot. Foci of arterial vascular calcification noted. IMPRESSION: Persistent subtle transverse lucencies in the proximal third  and proximal fourth metatarsals, unchanged from prior study and likely representing subtle nondisplaced fractures in these areas. Old healed fracture distal fourth metatarsal with remodeling. No acute fracture compared to prior study. No dislocation. Mild spurring dorsal midfoot. Inferior calcaneal spur. Foci of arterial vascular atherosclerotic calcification noted. Electronically Signed   By: Lowella Grip III M.D.   On: 04/18/2020 11:16        Scheduled Meds: . docusate sodium  100 mg Oral Daily  . enoxaparin (LOVENOX) injection  40 mg Subcutaneous Q24H  . melatonin  3 mg Oral QHS  . multivitamin  1 tablet Oral Daily  . pantoprazole (PROTONIX) IV  40 mg Intravenous Q12H  . polyethylene glycol  17 g Oral Daily  . sodium chloride flush  3 mL Intravenous Q12H  . sodium chloride  1 g Oral  TID WC   Continuous Infusions:    LOS: 3 days    Time spent: over 30 min    Fayrene Helper, MD [facility]   To contact the attending provider between 7A-7P or the covering provider during after hours 7P-7A, please log into the web site [email or web address] and access using universal [place] password for that web site. If you do not have the password, please call the hospital operator.  04/20/2020, 9:36 AM

## 2020-04-21 DIAGNOSIS — R55 Syncope and collapse: Secondary | ICD-10-CM | POA: Diagnosis not present

## 2020-04-21 LAB — COMPREHENSIVE METABOLIC PANEL
ALT: 16 U/L (ref 0–44)
AST: 20 U/L (ref 15–41)
Albumin: 2.4 g/dL — ABNORMAL LOW (ref 3.5–5.0)
Alkaline Phosphatase: 74 U/L (ref 38–126)
Anion gap: 6 (ref 5–15)
BUN: 18 mg/dL (ref 8–23)
CO2: 23 mmol/L (ref 22–32)
Calcium: 8.2 mg/dL — ABNORMAL LOW (ref 8.9–10.3)
Chloride: 99 mmol/L (ref 98–111)
Creatinine, Ser: 0.88 mg/dL (ref 0.44–1.00)
GFR, Estimated: >60 mL/min (ref >60)
Glucose, Bld: 102 mg/dL — ABNORMAL HIGH (ref 70–99)
Potassium: 4 mmol/L (ref 3.5–5.1)
Sodium: 128 mmol/L — ABNORMAL LOW (ref 135–145)
Total Bilirubin: 0.8 mg/dL (ref 0.3–1.2)
Total Protein: 6 g/dL — ABNORMAL LOW (ref 6.5–8.1)

## 2020-04-21 LAB — CBC WITH DIFFERENTIAL/PLATELET
Abs Immature Granulocytes: 0.03 10*3/uL (ref 0.00–0.07)
Basophils Absolute: 0 10*3/uL (ref 0.0–0.1)
Basophils Relative: 1 %
Eosinophils Absolute: 0.2 10*3/uL (ref 0.0–0.5)
Eosinophils Relative: 4 %
HCT: 32 % — ABNORMAL LOW (ref 36.0–46.0)
Hemoglobin: 10.5 g/dL — ABNORMAL LOW (ref 12.0–15.0)
Immature Granulocytes: 1 %
Lymphocytes Relative: 26 %
Lymphs Abs: 1.7 10*3/uL (ref 0.7–4.0)
MCH: 28.2 pg (ref 26.0–34.0)
MCHC: 32.8 g/dL (ref 30.0–36.0)
MCV: 85.8 fL (ref 80.0–100.0)
Monocytes Absolute: 0.8 10*3/uL (ref 0.1–1.0)
Monocytes Relative: 13 %
Neutro Abs: 3.6 10*3/uL (ref 1.7–7.7)
Neutrophils Relative %: 55 %
Platelets: 293 10*3/uL (ref 150–400)
RBC: 3.73 MIL/uL — ABNORMAL LOW (ref 3.87–5.11)
RDW: 15.2 % (ref 11.5–15.5)
WBC: 6.4 10*3/uL (ref 4.0–10.5)
nRBC: 0 % (ref 0.0–0.2)

## 2020-04-21 LAB — CULTURE, BLOOD (ROUTINE X 2)
Culture: NO GROWTH
Culture: NO GROWTH
Special Requests: ADEQUATE
Special Requests: ADEQUATE

## 2020-04-21 LAB — GLUCOSE, CAPILLARY
Glucose-Capillary: 104 mg/dL — ABNORMAL HIGH (ref 70–99)
Glucose-Capillary: 114 mg/dL — ABNORMAL HIGH (ref 70–99)
Glucose-Capillary: 110 mg/dL — ABNORMAL HIGH (ref 70–99)

## 2020-04-21 LAB — PHOSPHORUS: Phosphorus: 3 mg/dL (ref 2.5–4.6)

## 2020-04-21 LAB — MAGNESIUM: Magnesium: 1.6 mg/dL — ABNORMAL LOW (ref 1.7–2.4)

## 2020-04-21 MED ORDER — PANTOPRAZOLE SODIUM 40 MG PO TBEC
40.0000 mg | DELAYED_RELEASE_TABLET | Freq: Two times a day (BID) | ORAL | Status: DC
  Administered 2020-04-21 – 2020-04-24 (×7): 40 mg via ORAL
  Filled 2020-04-21 (×7): qty 1

## 2020-04-21 MED ORDER — MAGNESIUM SULFATE 2 GM/50ML IV SOLN
2.0000 g | Freq: Once | INTRAVENOUS | Status: AC
  Administered 2020-04-21: 2 g via INTRAVENOUS
  Filled 2020-04-21: qty 50

## 2020-04-21 MED ORDER — SODIUM CHLORIDE 0.9 % IV BOLUS
500.0000 mL | Freq: Once | INTRAVENOUS | Status: AC
  Administered 2020-04-21: 500 mL via INTRAVENOUS

## 2020-04-21 NOTE — Progress Notes (Addendum)
PROGRESS NOTE    [name]  GQQ:[id number] DOB: November 06, 1929 DOA: 04/16/2020 PCP: Hulan Fess, MD   Chief Complaint  Patient presents with  . Chest Pain    Brief Narrative:  Jennifer Kubicek Longmireis Cleave Ternes 87 y.o.femalewith medical history significant forhypertension, remote ovarian cancer, chronic hyponatremia who presents to the ED from SNF for evaluation of syncope and chest pain.History is supplemented by daughter at bedside.  Patient was recently admitted from 04/06/2020-04/11/2020 for syncopewith fall, suspected orthostatic versus micturition syncope in etiology+/- Ambien effect.TTE 04/06/2020 showed EF 70-75%, G1 DD, mild AR andAS. Her Ambien, Norvasc,and nadolol werediscontinued, lisinopril was continued on discharge. TED hose and advised to increase oral intake were recommended. She was discharged to SNF.  Daughter states that at her SNF, on the day of presentation, nursing reported that around 2 PM they were assisting her to ambulate to the bathroom when she became lightheaded/dizzy. They sat her on the commode and she developed [name] transient unresponsive episode. She did not void prior to this unresponsive period.Her blood pressure was reportedly 150s/99. Patient does state that she becomes easily lightheaded/dizzy when changing positions and soon after beginning to walk.  Around 3 PM patient developed [name] midsternal pressure-like chest discomfort without radiation. She did not have any associated diaphoresis, nausea, vomiting, or dyspnea. Symptoms have since resolved. She reports chronic heartburn/acid reflux related to [name] hiatal hernia which is notably different than the symptoms she was experiencing. Due to her chest discomfort she was sent to the ED for further evaluation.  Assessment & Plan:   Principal Problem:   Syncope Active Problems:   Hyponatremia   AKI (acute kidney injury) ([place])  Recurrent syncopalepisode with orthostatic hypotension. She has  positive orthostatic vital signs -> SBP drops from 150 to 121 on standing 3/8 Repeat orthostatics daily - continued orthostasis/weakness today (complained of dizziness) Thigh high ted hose, abdominal binder (when at SNF, can d/c overnight and place during daytime)  Follow with additional IVF Hold BP meds PT assessment recommended SNF. Continue PT OT with assistance and fall precautions. TOC assisting with SNF placement. Cardiology consult, appreciate recs - 30 day monitor post d/c with cards, compression stockings, follow orthostatics Have discussed extensively with daughter/pt as [name] component of fear/anxiety (appropriately) of falling/fainting is playing into this.  She's had fear of getting up, falling since February - difficult situation as more time in bed will increase weakness and unsteadiness, worsen orthostasis, and risk of falls.  Continue to encourage participation with therapy, OOB to chair.   Hyponatremia Presumed component of hypovolemia, with weight down, orthostatic hypotension - though SIADH is possible especially in setting of chronicity and urine sodium/osm's Continue salt tabs - will hold off on fluid restriction  Follow with additional bolus TSH wnl in February AM cortisol was normal 09/2019 - repeat  Nondisplaced Proximal 3rd and 4th metatarsal fractures Maybe contributing to falls/orthostasis if causing difficulty with ambulation or increasing time in bed due to pain Post op shoe, wbat, PT/OT Can follow with orthopedics outpatient  Follow vitamin D level (wnl)  Concern for esophageal perforation esophogram without evidence of perforation CXR today without acute cardiopulm disease  Aspiration: noted on esophagram, SLP evaluated today.  She denies any symptoms of coughing/choking while eating.  She's declined MBS with speech today.  Follow outpatient  Distal Esophageal Thickening  GERD:  BID PPI She's declined endoscopic evaluation here - consider outpatient GI  follow up if "alarm symptoms" Follow with GI prn  Hernias Hiatal hernia and 2 large  anterior abdominal wall hernias Follow outpatient with surgery as needed  Resolved chest pain: Patient with transient midsternal pressure-like chest discomfort without radiation. She says this felt different than her reflux symptoms. EKG is reassuring without acute ischemic changes - appears similar to priors. Troponin is weakly positive at 20, trended down to 18. No prior known cardiac history. Diffuse coronary calcifications and aortic atherosclerosis noted on CTA on admission. Cardiology following, outpatient follow up  Resolving AKI likely prerenal in the setting of dehydration: Creatinine 1.12 on admission up from 0.85 one week ago. Likely prerenal from hypovolemia. Improving, follow   Hypertension Hold antihypertensives in setting of orthostatic hypotension - likely will have to allow some degree of hypertension due to her orthostatic hypotension which is symptomatic  DVT prophylaxis: lovenox Code Status: full  Family Communication: daughter at over phone 3/12 Disposition:   Status is: Inpatient  Remains inpatient appropriate because:Inpatient level of care appropriate due to severity of illness   Dispo:  Patient From: [facility]  Planned Disposition: [facility]  Medically stable for discharge: No     Consultants:   GERD  cardiology  Procedures:   none  Antimicrobials:  Anti-infectives (From admission, onward)   None     Subjective: No new complaints Still feels weak, not ready for SNF  Objective: Vitals:   04/21/20 0400 04/21/20 0500 04/21/20 0719 04/21/20 1115  BP: 136/78  (!) 159/93 (!) 175/80  Pulse: 85  86 83  Resp: 20  20 20   Temp:   98.2 F (36.8 C) 98 F (36.7 C)  TempSrc:   Oral Oral  SpO2: 95%  92% 97%  Weight:  75.4 kg    Height:        Intake/Output Summary (Last 24 hours) at 04/21/2020 1309 Last data filed at  04/21/2020 0730 Gross per 24 hour  Intake 240 ml  Output 1210 ml  Net -970 ml   Filed Weights   04/19/20 0500 04/20/20 0447 04/21/20 0500  Weight: 74.1 kg 76.1 kg 75.4 kg    Examination:  General: No acute distress. Cardiovascular: Heart sounds show [name] regular rate, and rhythm.  Lungs: unlabored Abdomen: Soft, nontender, nondistended  Neurological: Alert and oriented 3. Moves all extremities 4 . Cranial nerves II through XII grossly intact. Skin: Warm and dry. No rashes or lesions. Extremities: No clubbing or cyanosis. No edema.  Data Reviewed: I have personally reviewed following labs and imaging studies  CBC: Recent Labs  Lab 04/16/20 1731 04/17/20 0125 04/19/20 0945 04/20/20 0024 04/21/20 0055  WBC 9.5 9.9 7.9 7.0 6.4  NEUTROABS  --   --  5.3 4.0 3.6  HGB 13.1 12.0 11.7* 10.7* 10.5*  HCT 40.6 35.3* 35.1* 32.4* 32.0*  MCV 86.2 85.3 85.2 85.3 85.8  PLT 395 326 316 306 097    Basic Metabolic Panel: Recent Labs  Lab 04/17/20 0125 04/17/20 1131 04/18/20 0130 04/19/20 0945 04/20/20 0024 04/21/20 0055  NA 129* 129* 130* 127* 128* 128*  K 4.7 4.3 4.2 4.1 4.4 4.0  CL 95* 99 100 97* 99 99  CO2 26 22 24 23 24 23   GLUCOSE 129* 125* 83 132* 104* 102*  BUN 27* 22 16 20 21 18   CREATININE 1.06* 1.02* 0.91 0.97 0.94 0.88  CALCIUM 8.9 8.8* 8.6* 8.5* 8.5* 8.2*  MG 1.8  --   --  1.5* 2.1 1.6*  PHOS  --   --   --  2.2* 2.9 3.0    GFR: Estimated Creatinine Clearance:  43 mL/min (by C-G formula based on SCr of 0.88 mg/dL).  Liver Function Tests: Recent Labs  Lab 04/19/20 0945 04/20/20 0024 04/21/20 0055  AST 26 23 20   ALT 18 18 16   ALKPHOS 80 75 74  BILITOT 0.9 0.6 0.8  PROT 6.5 6.1* 6.0*  ALBUMIN 2.7* 2.5* 2.4*    CBG: Recent Labs  Lab 04/19/20 1859 04/20/20 0148 04/20/20 0616 04/21/20 0613 04/21/20 1112  GLUCAP 124* 104* 110* 104* 114*     Recent Results (from the past 240 hour(s))  SARS CORONAVIRUS 2 (TAT 6-24 HRS) Nasopharyngeal Nasopharyngeal  Swab     Status: None   Collection Time: 04/16/20  8:56 PM   Specimen: Nasopharyngeal Swab  Result Value Ref Range Status   SARS Coronavirus 2 NEGATIVE NEGATIVE Final    Comment: (NOTE) SARS-CoV-2 target nucleic acids are NOT DETECTED.  The SARS-CoV-2 RNA is generally detectable in upper and lower respiratory specimens during the acute phase of infection. Negative results do not preclude SARS-CoV-2 infection, do not rule out co-infections with other pathogens, and should not be used as the sole basis for treatment or other patient management decisions. Negative results must be combined with clinical observations, patient history, and epidemiological information. The expected result is Negative.  Fact Sheet for Patients: SugarRoll.be  Fact Sheet for Healthcare Providers: https://www.woods-mathews.com/  This test is not yet approved or cleared by the [place] FDA and  has been authorized for detection and/or diagnosis of SARS-CoV-2 by FDA under an Emergency Use Authorization (EUA). This EUA will remain  in effect (meaning this test can be used) for the duration of the COVID-19 declaration under Se ction 564(b)(1) of the Act, 21 U.S.C. section 360bbb-3(b)(1), unless the authorization is terminated or revoked sooner.  Performed at [facility], [place] [address] [place], [place] [address]   Culture, blood (routine x 2)     Status: None (Preliminary result)   Collection Time: 04/16/20  9:10 PM   Specimen: BLOOD  Result Value Ref Range Status   Specimen Description BLOOD SITE NOT SPECIFIED  Final   Special Requests   Final    BOTTLES DRAWN AEROBIC AND ANAEROBIC Blood Culture adequate volume   Culture   Final    NO GROWTH 4 DAYS Performed at [facility], [place] [address] [place], [place] [address]    Report Status PENDING  Incomplete  Culture, blood (routine x 2)     Status: None (Preliminary result)   Collection Time:  04/16/20  9:11 PM   Specimen: BLOOD  Result Value Ref Range Status   Specimen Description BLOOD SITE NOT SPECIFIED  Final   Special Requests   Final    BOTTLES DRAWN AEROBIC AND ANAEROBIC Blood Culture adequate volume   Culture   Final    NO GROWTH 4 DAYS Performed at [facility], [address], [place], [place] [address]    Report Status PENDING  Incomplete  MRSA PCR Screening     Status: None   Collection Time: 04/17/20  2:08 AM   Specimen: Nasal Mucosa; Nasopharyngeal  Result Value Ref Range Status   MRSA by PCR NEGATIVE NEGATIVE Final    Comment:        The GeneXpert MRSA Assay (FDA approved for NASAL specimens only), is one component of [name] comprehensive MRSA colonization surveillance program. It is not intended to diagnose MRSA infection nor to guide or monitor treatment for MRSA infections. Performed at [facility], [place]  [address] [place], [place] [address]   SARS CORONAVIRUS 2 (TAT 6-24 HRS) Nasopharyngeal     Status: None   Collection Time: 04/19/20  9:50 AM   Specimen: Nasopharyngeal  Result Value Ref Range Status   SARS Coronavirus 2 NEGATIVE NEGATIVE Final    Comment: (NOTE) SARS-CoV-2 target nucleic acids are NOT DETECTED.  The SARS-CoV-2 RNA is generally detectable in upper and lower respiratory specimens during the acute phase of infection. Negative results do not preclude SARS-CoV-2 infection, do not rule out co-infections with other pathogens, and should not be used as the sole basis for treatment or other patient management decisions. Negative results must be combined with clinical observations, patient history, and epidemiological information. The expected result is Negative.  Fact Sheet for Patients: SugarRoll.be  Fact Sheet for Healthcare Providers: https://www.woods-mathews.com/  This test is not yet approved or cleared by the [place] FDA and  has been authorized for detection and/or  diagnosis of SARS-CoV-2 by FDA under an Emergency Use Authorization (EUA). This EUA will remain  in effect (meaning this test can be used) for the duration of the COVID-19 declaration under Se ction 564(b)(1) of the Act, 21 U.S.C. section 360bbb-3(b)(1), unless the authorization is terminated or revoked sooner.  Performed at [facility], [place] [address] [place], [place] [address]          Radiology Studies: No results found.      Scheduled Meds: . docusate sodium  100 mg Oral Daily  . enoxaparin (LOVENOX) injection  40 mg Subcutaneous Q24H  . melatonin  3 mg Oral QHS  . multivitamin  1 tablet Oral Daily  . pantoprazole (PROTONIX) IV  40 mg Intravenous Q12H  . polyethylene glycol  17 g Oral Daily  . sodium chloride flush  3 mL Intravenous Q12H  . sodium chloride  1 g Oral TID WC   Continuous Infusions:    LOS: 4 days    Time spent: over 30 min    Fayrene Helper, MD [facility]   To contact the attending provider between 7A-7P or the covering provider during after hours 7P-7A, please log into the web site [email or web address] and access using universal [place] password for that web site. If you do not have the password, please call the hospital operator.  04/21/2020, 1:09 PM

## 2020-04-21 NOTE — Progress Notes (Signed)
Pt' BP in lying position 174/93 (116), pt's BP in sitting position 164/93 (111). Pt felt dizzy and light headed changing the position from lying to sitting. After making her sit like 5 min, tried to get her up and immediately she got up she felt light headed and dizzy again, so made her lying down in bed, pt is resting comfortable in bed now. Abdominal binder applied but pt felt discomfort and released shortly,  Will continue to monitor the patient  Palma Holter, RN

## 2020-04-21 NOTE — Progress Notes (Signed)
Pt is able to get out of the bed after her 500 cc bolus, pt still felt some dizzy but able to transfer to chair with the help of RN, nurse tech and her [place] who was present at bed side. Will see how long she able to tolerate and go from there. BP running higher side, Dr. Florene Glen informed and asked to just observe this time.  Palma Holter, RN

## 2020-04-21 NOTE — Progress Notes (Signed)
Pt tolerated sitting in a chair for one and half hour. Pt felt some dizzy but not whole lot. Safely transitioned to bed, pt is resting this time, Son is in bed side with the patient. MD is aware about High BP throughout the day, pt is not symptomatic, so we are observing this time. Last BP is 146/68  At 1730.  Palma Holter, RN

## 2020-04-22 DIAGNOSIS — R55 Syncope and collapse: Secondary | ICD-10-CM | POA: Diagnosis not present

## 2020-04-22 LAB — GLUCOSE, CAPILLARY
Glucose-Capillary: 109 mg/dL — ABNORMAL HIGH (ref 70–99)
Glucose-Capillary: 122 mg/dL — ABNORMAL HIGH (ref 70–99)
Glucose-Capillary: 123 mg/dL — ABNORMAL HIGH (ref 70–99)
Glucose-Capillary: 93 mg/dL (ref 70–99)

## 2020-04-22 LAB — COMPREHENSIVE METABOLIC PANEL
ALT: 16 U/L (ref 0–44)
AST: 22 U/L (ref 15–41)
Albumin: 2.5 g/dL — ABNORMAL LOW (ref 3.5–5.0)
Alkaline Phosphatase: 75 U/L (ref 38–126)
Anion gap: 6 (ref 5–15)
BUN: 16 mg/dL (ref 8–23)
CO2: 22 mmol/L (ref 22–32)
Calcium: 8.4 mg/dL — ABNORMAL LOW (ref 8.9–10.3)
Chloride: 98 mmol/L (ref 98–111)
Creatinine, Ser: 0.83 mg/dL (ref 0.44–1.00)
GFR, Estimated: >60 mL/min (ref >60)
Glucose, Bld: 111 mg/dL — ABNORMAL HIGH (ref 70–99)
Potassium: 4.1 mmol/L (ref 3.5–5.1)
Sodium: 126 mmol/L — ABNORMAL LOW (ref 135–145)
Total Bilirubin: 1 mg/dL (ref 0.3–1.2)
Total Protein: 6.5 g/dL (ref 6.5–8.1)

## 2020-04-22 LAB — CBC WITH DIFFERENTIAL/PLATELET
Abs Immature Granulocytes: 0.04 10*3/uL (ref 0.00–0.07)
Basophils Absolute: 0 10*3/uL (ref 0.0–0.1)
Basophils Relative: 1 %
Eosinophils Absolute: 0.1 10*3/uL (ref 0.0–0.5)
Eosinophils Relative: 1 %
HCT: 32.5 % — ABNORMAL LOW (ref 36.0–46.0)
Hemoglobin: 10.8 g/dL — ABNORMAL LOW (ref 12.0–15.0)
Immature Granulocytes: 1 %
Lymphocytes Relative: 18 %
Lymphs Abs: 1.5 10*3/uL (ref 0.7–4.0)
MCH: 28.2 pg (ref 26.0–34.0)
MCHC: 33.2 g/dL (ref 30.0–36.0)
MCV: 84.9 fL (ref 80.0–100.0)
Monocytes Absolute: 1.1 10*3/uL — ABNORMAL HIGH (ref 0.1–1.0)
Monocytes Relative: 13 %
Neutro Abs: 5.6 10*3/uL (ref 1.7–7.7)
Neutrophils Relative %: 66 %
Platelets: 322 10*3/uL (ref 150–400)
RBC: 3.83 MIL/uL — ABNORMAL LOW (ref 3.87–5.11)
RDW: 14.9 % (ref 11.5–15.5)
WBC: 8.4 10*3/uL (ref 4.0–10.5)
nRBC: 0 % (ref 0.0–0.2)

## 2020-04-22 LAB — CORTISOL: Cortisol, Plasma: 13.2 ug/dL

## 2020-04-22 LAB — OSMOLALITY, URINE: Osmolality, Ur: 263 mOsm/kg — ABNORMAL LOW (ref 300–900)

## 2020-04-22 LAB — PHOSPHORUS: Phosphorus: 2.8 mg/dL (ref 2.5–4.6)

## 2020-04-22 LAB — MAGNESIUM: Magnesium: 1.6 mg/dL — ABNORMAL LOW (ref 1.7–2.4)

## 2020-04-22 LAB — SODIUM, URINE, RANDOM: Sodium, Ur: 62 mmol/L

## 2020-04-22 MED ORDER — MAGNESIUM SULFATE 2 GM/50ML IV SOLN
2.0000 g | Freq: Once | INTRAVENOUS | Status: AC
  Administered 2020-04-22: 2 g via INTRAVENOUS
  Filled 2020-04-22: qty 50

## 2020-04-22 MED ORDER — SODIUM CHLORIDE 1 G PO TABS
1.0000 g | ORAL_TABLET | Freq: Two times a day (BID) | ORAL | Status: DC
  Administered 2020-04-23 – 2020-04-24 (×4): 1 g via ORAL
  Filled 2020-04-22 (×5): qty 1

## 2020-04-22 MED ORDER — SODIUM CHLORIDE 1 G PO TABS: 2.0000 g | ORAL_TABLET | Freq: Three times a day (TID) | ORAL | Status: DC

## 2020-04-22 MED ORDER — COSYNTROPIN 0.25 MG IJ SOLR
0.2500 mg | Freq: Once | INTRAMUSCULAR | Status: AC
  Administered 2020-04-23: 0.25 mg via INTRAVENOUS
  Filled 2020-04-22 (×2): qty 0.25

## 2020-04-22 MED ORDER — SODIUM CHLORIDE 1 G PO TABS: 2.0000 g | ORAL_TABLET | Freq: Two times a day (BID) | ORAL | Status: DC

## 2020-04-22 MED ORDER — SODIUM CHLORIDE 1 G PO TABS
1.0000 g | ORAL_TABLET | Freq: Three times a day (TID) | ORAL | Status: DC
  Administered 2020-04-22 (×2): 1 g via ORAL
  Filled 2020-04-22 (×3): qty 1

## 2020-04-22 MED ORDER — ONDANSETRON HCL 4 MG/2ML IJ SOLN
4.0000 mg | Freq: Three times a day (TID) | INTRAMUSCULAR | Status: DC | PRN
  Administered 2020-04-22: 4 mg via INTRAVENOUS
  Filled 2020-04-22: qty 2

## 2020-04-22 NOTE — Plan of Care (Signed)
  Problem: Education: Goal: Knowledge of General Education information will improve Description: Including pain rating scale, medication(s)/side effects and non-pharmacologic comfort measures Outcome: Progressing   Problem: Clinical Measurements: Goal: Cardiovascular complication will be avoided Outcome: Progressing   Problem: Activity: Goal: Risk for activity intolerance will decrease Outcome: Not Progressing   Problem: Nutrition: Goal: Adequate nutrition will be maintained Outcome: Progressing   Problem: Coping: Goal: Level of anxiety will decrease Outcome: Progressing

## 2020-04-22 NOTE — Progress Notes (Signed)
PROGRESS NOTE    [name]  NFA:[id number] DOB: 1929/08/23 DOA: 04/16/2020 PCP: Hulan Fess, MD   Chief Complaint  Patient presents with  . Chest Pain    Brief Narrative:  [name] 87 y.o.femalewith medical history significant forhypertension, remote ovarian cancer, chronic hyponatremia who presents to the ED from SNF for evaluation of syncope and chest pain.History is supplemented by daughter at bedside.  Patient was recently admitted from 04/06/2020-04/11/2020 for syncopewith fall, suspected orthostatic versus micturition syncope in etiology+/- Ambien effect.TTE 04/06/2020 showed EF 70-75%, G1 DD, mild AR andAS. Her Ambien, Norvasc,and nadolol werediscontinued, lisinopril was continued on discharge. TED hose and advised to increase oral intake were recommended. She was discharged to SNF.  Daughter states that at her SNF, on the day of presentation, nursing reported that around 2 PM they were assisting her to ambulate to the bathroom when she became lightheaded/dizzy. They sat her on the commode and she developed [name] transient unresponsive episode. She did not void prior to this unresponsive period.Her blood pressure was reportedly 150s/99. Patient does state that she becomes easily lightheaded/dizzy when changing positions and soon after beginning to walk.  Around 3 PM patient developed [name] midsternal pressure-like chest discomfort without radiation. She did not have any associated diaphoresis, nausea, vomiting, or dyspnea. Symptoms have since resolved. She reports chronic heartburn/acid reflux related to [name] hiatal hernia which is notably different than the symptoms she was experiencing. Due to her chest discomfort she was sent to the ED for further evaluation.  Assessment & Plan:   Principal Problem:   Syncope Active Problems:   Hyponatremia   AKI (acute kidney injury) ([place])  Recurrent syncopalepisode with orthostatic hypotension. She has  positive orthostatic vital signs -> SBP drops from 150 to 121 on standing 3/8 Repeat orthostatics daily - seemed to feel better today? Continue to monitor Thigh high ted hose, abdominal binder (when at SNF, can d/c overnight and place during daytime)  Follow with additional IVF Hold BP meds PT assessment recommended SNF. Continue PT OT with assistance and fall precautions. TOC assisting with SNF placement. Cardiology consult, appreciate recs - 30 day monitor post d/c with cards, compression stockings, follow orthostatics Have discussed extensively with daughter/pt as [name] component of fear/anxiety (appropriately) of falling/fainting is playing into this.  She's had fear of getting up, falling since February - difficult situation as more time in bed will increase weakness and unsteadiness, worsen orthostasis, and risk of falls.  Continue to encourage participation with therapy, OOB to chair.   Hyponatremia Presumed component of hypovolemia, with weight down, orthostatic hypotension Suspect component of SIADH is possible especially in setting of chronicity and urine sodium/osm's Continue salt tabs - will add fluid restriction today - wasn't tolerating salt tabs well  TSH wnl in February Follow acth stim  Nondisplaced Proximal 3rd and 4th metatarsal fractures Maybe contributing to falls/orthostasis if causing difficulty with ambulation or increasing time in bed due to pain Post op shoe, wbat, PT/OT Can follow with orthopedics outpatient  Follow vitamin D level (wnl)  Concern for esophageal perforation esophogram without evidence of perforation CXR today without acute cardiopulm disease  Aspiration: noted on esophagram, SLP evaluated today.  She denies any symptoms of coughing/choking while eating.  She's declined MBS with speech today.  Follow outpatient  Distal Esophageal Thickening  GERD:  BID PPI She's declined endoscopic evaluation here - consider outpatient GI follow up if "alarm  symptoms" Follow with GI prn  Hernias Hiatal hernia and 2 large  anterior abdominal wall hernias Follow outpatient with surgery as needed  Resolved chest pain: Patient with transient midsternal pressure-like chest discomfort without radiation. She says this felt different than her reflux symptoms. EKG is reassuring without acute ischemic changes - appears similar to priors. Troponin is weakly positive at 20, trended down to 18. No prior known cardiac history. Diffuse coronary calcifications and aortic atherosclerosis noted on CTA on admission. Cardiology following, outpatient follow up  Resolving AKI likely prerenal in the setting of dehydration: Creatinine 1.12 on admission up from 0.85 one week ago. Likely prerenal from hypovolemia. Improving, follow   Hypertension Hold antihypertensives in setting of orthostatic hypotension - likely will have to allow some degree of hypertension due to her orthostatic hypotension which is symptomatic  DVT prophylaxis: lovenox Code Status: full  Family Communication: daughter at over phone 3/13 Disposition:   Status is: Inpatient  Remains inpatient appropriate because:Inpatient level of care appropriate due to severity of illness   Dispo:  Patient From: [facility]  Planned Disposition: [facility]  Medically stable for discharge: No     Consultants:   GERD  cardiology  Procedures:   none  Antimicrobials:  Anti-infectives (From admission, onward)   None     Subjective: Feeling [name] little better today  Objective: Vitals:   04/22/20 0717 04/22/20 1155 04/22/20 1612 04/22/20 1753  BP: (!) 157/74 126/89 (!) 187/76 (!) 145/57  Pulse: 84 88 95 98  Resp: 18 18 17  (!) 21  Temp:  98.9 F (37.2 C) 98.8 F (37.1 C) 99.3 F (37.4 C)  TempSrc:  Oral Oral Oral  SpO2: 98% 98% 97% 97%  Weight:      Height:        Intake/Output Summary (Last 24 hours) at 04/22/2020 1917 Last data filed at 04/22/2020  1600 Gross per 24 hour  Intake 600 ml  Output 1200 ml  Net -600 ml   Filed Weights   04/20/20 0447 04/21/20 0500 04/22/20 0500  Weight: 76.1 kg 75.4 kg 75.8 kg    Examination:  General: No acute distress. Cardiovascular: Heart sounds show [name] regular rate, and rhythm. Lungs: Clear to auscultation bilaterally Abdomen: Soft, nontender, nondistended Neurological: Alert and oriented 3. Moves all extremities 4. Cranial nerves II through XII grossly intact. Skin: Warm and dry. No rashes or lesions. Extremities: No clubbing or cyanosis. No edema.   Data Reviewed: I have personally reviewed following labs and imaging studies  CBC: Recent Labs  Lab 04/17/20 0125 04/19/20 0945 04/20/20 0024 04/21/20 0055 04/22/20 0109  WBC 9.9 7.9 7.0 6.4 8.4  NEUTROABS  --  5.3 4.0 3.6 5.6  HGB 12.0 11.7* 10.7* 10.5* 10.8*  HCT 35.3* 35.1* 32.4* 32.0* 32.5*  MCV 85.3 85.2 85.3 85.8 84.9  PLT 326 316 306 293 161    Basic Metabolic Panel: Recent Labs  Lab 04/17/20 0125 04/17/20 1131 04/18/20 0130 04/19/20 0945 04/20/20 0024 04/21/20 0055 04/22/20 0109  NA 129*   < > 130* 127* 128* 128* 126*  K 4.7   < > 4.2 4.1 4.4 4.0 4.1  CL 95*   < > 100 97* 99 99 98  CO2 26   < > 24 23 24 23 22   GLUCOSE 129*   < > 83 132* 104* 102* 111*  BUN 27*   < > 16 20 21 18 16   CREATININE 1.06*   < > 0.91 0.97 0.94 0.88 0.83  CALCIUM 8.9   < > 8.6* 8.5* 8.5* 8.2* 8.4*  MG 1.8  --   --  1.5* 2.1 1.6* 1.6*  PHOS  --   --   --  2.2* 2.9 3.0 2.8   < > = values in this interval not displayed.    GFR: Estimated Creatinine Clearance: 45.7 mL/min (by C-G formula based on SCr of 0.83 mg/dL).  Liver Function Tests: Recent Labs  Lab 04/19/20 0945 04/20/20 0024 04/21/20 0055 04/22/20 0109  AST 26 23 20 22   ALT 18 18 16 16   ALKPHOS 80 75 74 75  BILITOT 0.9 0.6 0.8 1.0  PROT 6.5 6.1* 6.0* 6.5  ALBUMIN 2.7* 2.5* 2.4* 2.5*    CBG: Recent Labs  Lab 04/21/20 1757 04/22/20 0048 04/22/20 0616  04/22/20 1149 04/22/20 1752  GLUCAP 110* 122* 93 109* 123*     Recent Results (from the past 240 hour(s))  SARS CORONAVIRUS 2 (TAT 6-24 HRS) Nasopharyngeal Nasopharyngeal Swab     Status: None   Collection Time: 04/16/20  8:56 PM   Specimen: Nasopharyngeal Swab  Result Value Ref Range Status   SARS Coronavirus 2 NEGATIVE NEGATIVE Final    Comment: (NOTE) SARS-CoV-2 target nucleic acids are NOT DETECTED.  The SARS-CoV-2 RNA is generally detectable in upper and lower respiratory specimens during the acute phase of infection. Negative results do not preclude SARS-CoV-2 infection, do not rule out co-infections with other pathogens, and should not be used as the sole basis for treatment or other patient management decisions. Negative results must be combined with clinical observations, patient history, and epidemiological information. The expected result is Negative.  Fact Sheet for Patients: SugarRoll.be  Fact Sheet for Healthcare Providers: https://www.woods-mathews.com/  This test is not yet approved or cleared by the [place] FDA and  has been authorized for detection and/or diagnosis of SARS-CoV-2 by FDA under an Emergency Use Authorization (EUA). This EUA will remain  in effect (meaning this test can be used) for the duration of the COVID-19 declaration under Se ction 564(b)(1) of the Act, 21 U.S.C. section 360bbb-3(b)(1), unless the authorization is terminated or revoked sooner.  Performed at [facility], [place] [address], [place], [place] [address]   Culture, blood (routine x 2)     Status: None   Collection Time: 04/16/20  9:10 PM   Specimen: BLOOD  Result Value Ref Range Status   Specimen Description BLOOD SITE NOT SPECIFIED  Final   Special Requests   Final    BOTTLES DRAWN AEROBIC AND ANAEROBIC Blood Culture adequate volume   Culture   Final    NO GROWTH 5 DAYS Performed at [facility], [address] [place], [place] [address]    Report Status 04/21/2020 FINAL  Final  Culture, blood (routine x 2)     Status: None   Collection Time: 04/16/20  9:11 PM   Specimen: BLOOD  Result Value Ref Range Status   Specimen Description BLOOD SITE NOT SPECIFIED  Final   Special Requests   Final    BOTTLES DRAWN AEROBIC AND ANAEROBIC Blood Culture adequate volume   Culture   Final    NO GROWTH 5 DAYS Performed at [facility], [place] [address], [place],  [address]    Report Status 04/21/2020 FINAL  Final  MRSA PCR Screening     Status: None   Collection Time: 04/17/20  2:08 AM   Specimen: Nasal Mucosa; Nasopharyngeal  Result Value Ref Range Status   MRSA by PCR NEGATIVE NEGATIVE Final    Comment:  The GeneXpert MRSA Assay (FDA approved for NASAL specimens only), is one component of Anupama Piehl comprehensive MRSA colonization surveillance program. It is not intended to diagnose MRSA infection nor to guide or monitor treatment for MRSA infections. Performed at [facility], [place] [address], [place], [place] [address]   SARS CORONAVIRUS 2 (TAT 6-24 HRS) Nasopharyngeal     Status: None   Collection Time: 04/19/20  9:50 AM   Specimen: Nasopharyngeal  Result Value Ref Range Status   SARS Coronavirus 2 NEGATIVE NEGATIVE Final    Comment: (NOTE) SARS-CoV-2 target nucleic acids are NOT DETECTED.  The SARS-CoV-2 RNA is generally detectable in upper and lower respiratory specimens during the acute phase of infection. Negative results do not preclude SARS-CoV-2 infection, do not rule out co-infections with other pathogens, and should not be used as the sole basis for treatment or other patient management decisions. Negative results must be combined with clinical observations, patient history, and epidemiological information. The expected result is Negative.  Fact Sheet for Patients: SugarRoll.be  Fact Sheet for Healthcare  Providers: https://www.woods-mathews.com/  This test is not yet approved or cleared by the [place] FDA and  has been authorized for detection and/or diagnosis of SARS-CoV-2 by FDA under an Emergency Use Authorization (EUA). This EUA will remain  in effect (meaning this test can be used) for the duration of the COVID-19 declaration under Se ction 564(b)(1) of the Act, 21 U.S.C. section 360bbb-3(b)(1), unless the authorization is terminated or revoked sooner.  Performed at [facility], [place] [address] [place], [place] [address]          Radiology Studies: No results found.      Scheduled Meds: . [START ON 04/23/2020] cosyntropin  0.25 mg Intravenous Once  . docusate sodium  100 mg Oral Daily  . enoxaparin (LOVENOX) injection  40 mg Subcutaneous Q24H  . melatonin  3 mg Oral QHS  . multivitamin  1 tablet Oral Daily  . pantoprazole  40 mg Oral BID  . polyethylene glycol  17 g Oral Daily  . sodium chloride flush  3 mL Intravenous Q12H  . sodium chloride  1 g Oral TID WC   Continuous Infusions:    LOS: 5 days    Time spent: over 30 min    Fayrene Helper, MD [facility]   To contact the attending provider between 7A-7P or the covering provider during after hours 7P-7A, please log into the web site [email or web address] and access using universal [place] password for that web site. If you do not have the password, please call the hospital operator.  04/22/2020, 7:17 PM

## 2020-04-22 NOTE — Progress Notes (Addendum)
Pt sat in a recliner for 3-4 hours. She complained dizziness but very low in compared to yesterday.Pt looks more stable her feet today,  BP was good throughout the day until this evening, Systolic is in 456-256'L, will keep eye on it. Informed to Dr. Florene Glen in evening rounds, updated son and daughter in a bed side in a different timings.  Will continue to monitor  Palma Holter, RN

## 2020-04-23 DIAGNOSIS — R55 Syncope and collapse: Secondary | ICD-10-CM | POA: Diagnosis not present

## 2020-04-23 LAB — COMPREHENSIVE METABOLIC PANEL
ALT: 25 U/L (ref 0–44)
AST: 33 U/L (ref 15–41)
Albumin: 2.5 g/dL — ABNORMAL LOW (ref 3.5–5.0)
Alkaline Phosphatase: 103 U/L (ref 38–126)
Anion gap: 7 (ref 5–15)
BUN: 17 mg/dL (ref 8–23)
CO2: 25 mmol/L (ref 22–32)
Calcium: 8.6 mg/dL — ABNORMAL LOW (ref 8.9–10.3)
Chloride: 96 mmol/L — ABNORMAL LOW (ref 98–111)
Creatinine, Ser: 0.97 mg/dL (ref 0.44–1.00)
GFR, Estimated: 56 mL/min — ABNORMAL LOW (ref >60)
Glucose, Bld: 142 mg/dL — ABNORMAL HIGH (ref 70–99)
Potassium: 4.2 mmol/L (ref 3.5–5.1)
Sodium: 128 mmol/L — ABNORMAL LOW (ref 135–145)
Total Bilirubin: 0.9 mg/dL (ref 0.3–1.2)
Total Protein: 6.9 g/dL (ref 6.5–8.1)

## 2020-04-23 LAB — CBC WITH DIFFERENTIAL/PLATELET
Abs Immature Granulocytes: 0.02 10*3/uL (ref 0.00–0.07)
Basophils Absolute: 0.1 10*3/uL (ref 0.0–0.1)
Basophils Relative: 1 %
Eosinophils Absolute: 0.2 10*3/uL (ref 0.0–0.5)
Eosinophils Relative: 3 %
HCT: 34.9 % — ABNORMAL LOW (ref 36.0–46.0)
Hemoglobin: 11.9 g/dL — ABNORMAL LOW (ref 12.0–15.0)
Immature Granulocytes: 0 %
Lymphocytes Relative: 20 %
Lymphs Abs: 1.3 10*3/uL (ref 0.7–4.0)
MCH: 28.6 pg (ref 26.0–34.0)
MCHC: 34.1 g/dL (ref 30.0–36.0)
MCV: 83.9 fL (ref 80.0–100.0)
Monocytes Absolute: 0.7 10*3/uL (ref 0.1–1.0)
Monocytes Relative: 11 %
Neutro Abs: 4.4 10*3/uL (ref 1.7–7.7)
Neutrophils Relative %: 65 %
Platelets: 336 10*3/uL (ref 150–400)
RBC: 4.16 MIL/uL (ref 3.87–5.11)
RDW: 15 % (ref 11.5–15.5)
WBC: 6.7 10*3/uL (ref 4.0–10.5)
nRBC: 0 % (ref 0.0–0.2)

## 2020-04-23 LAB — GLUCOSE, CAPILLARY
Glucose-Capillary: 121 mg/dL — ABNORMAL HIGH (ref 70–99)
Glucose-Capillary: 191 mg/dL — ABNORMAL HIGH (ref 70–99)
Glucose-Capillary: 130 mg/dL — ABNORMAL HIGH (ref 70–99)

## 2020-04-23 LAB — MAGNESIUM: Magnesium: 1.6 mg/dL — ABNORMAL LOW (ref 1.7–2.4)

## 2020-04-23 LAB — ACTH STIMULATION, 3 TIME POINTS
Cortisol, 30 Min: 32 ug/dL
Cortisol, 60 Min: 35.5 ug/dL
Cortisol, Base: 19.6 ug/dL

## 2020-04-23 LAB — PHOSPHORUS: Phosphorus: 2.6 mg/dL (ref 2.5–4.6)

## 2020-04-23 LAB — URIC ACID: Uric Acid, Serum: 4.3 mg/dL (ref 2.5–7.1)

## 2020-04-23 LAB — OSMOLALITY: Osmolality: 270 mOsm/kg — ABNORMAL LOW (ref 275–295)

## 2020-04-23 MED ORDER — MAGNESIUM OXIDE 400 (241.3 MG) MG PO TABS
400.0000 mg | ORAL_TABLET | Freq: Two times a day (BID) | ORAL | Status: AC
  Administered 2020-04-23 – 2020-04-24 (×4): 400 mg via ORAL
  Filled 2020-04-23 (×4): qty 1

## 2020-04-23 MED ORDER — FUROSEMIDE 20 MG PO TABS
10.0000 mg | ORAL_TABLET | Freq: Every day | ORAL | Status: DC
  Administered 2020-04-23 – 2020-04-24 (×2): 10 mg via ORAL
  Filled 2020-04-23 (×2): qty 1

## 2020-04-23 MED ORDER — MAGNESIUM SULFATE 2 GM/50ML IV SOLN
2.0000 g | Freq: Once | INTRAVENOUS | Status: AC
  Administered 2020-04-23: 2 g via INTRAVENOUS
  Filled 2020-04-23: qty 50

## 2020-04-23 NOTE — Plan of Care (Signed)
  Problem: Education: Goal: Knowledge of General Education information will improve Description: Including pain rating scale, medication(s)/side effects and non-pharmacologic comfort measures Outcome: Progressing   Problem: Health Behavior/Discharge Planning: Goal: Ability to manage health-related needs will improve Outcome: Progressing   Problem: Clinical Measurements: Goal: Ability to maintain clinical measurements within normal limits will improve Outcome: Progressing Goal: Will remain free from infection Outcome: Progressing Goal: Diagnostic test results will improve Outcome: Progressing Goal: Respiratory complications will improve Outcome: Progressing Goal: Cardiovascular complication will be avoided Outcome: Progressing   Problem: Activity: Goal: Risk for activity intolerance will decrease Outcome: Progressing   Problem: Nutrition: Goal: Adequate nutrition will be maintained Outcome: Progressing   Problem: Elimination: Goal: Will not experience complications related to bowel motility Outcome: Progressing   Problem: Safety: Goal: Ability to remain free from injury will improve Outcome: Progressing   Problem: Skin Integrity: Goal: Risk for impaired skin integrity will decrease Outcome: Progressing   Problem: Coping: Goal: Level of anxiety will decrease Outcome: Not Progressing

## 2020-04-23 NOTE — Consult Note (Addendum)
Reason for Consult: Hyponatremia-acute on chronic Referring Physician: Fayrene Helper MD [facility])  HPI:  87 year old Caucasian woman with past medical history significant for hypertension (recently complicated by orthostatic hypotension), history of remote ovarian cancer, dyslipidemia, osteoarthritis and baseline hyponatremia (sodium 129-133) who was brought to the hospital a week ago from her skilled nursing facility for the evaluation and management of syncope and chest pain.  She had been discharged less than a week before that after an admission for a fall associated with orthostatic hypotension and suspected polypharmacy.  At the time of admission, she was found to be hypotensive with a blood pressure of 66/52, tachycardic heart rate 120 and hyponatremia-sodium 126.  After administration of intravenous fluids, sodium has been stuck at 128-129.  TSH normal at 2.35, normal ACTH stimulation test and with urine osmolality ranging 263-359.  GFR has remained normal with an albumin of 2.5.  She denies any history of congestive heart failure with recent echocardiogram showing grade 1 diastolic dysfunction with robust EF.  She reports to have been an avid water drinker and was surprised earlier today by being advised on fluid restriction.  She denies any chronic systemic NSAID use.  She was on an ACE inhibitor prior to admission.    04/16/2020 17:31 04/17/2020 11:31 04/18/2020 01:30 04/19/2020 09:45 04/21/2020 00:55 04/22/2020 01:09 04/23/2020 06:52  Sodium 126 (L) 129 (L) 130 (L) 127 (L) 128 (L) 126 (L) 128 (L)    Past Medical History:  Diagnosis Date  . Anemia associated with acute blood loss 04/03/2011  . Arthritis    knees   . Ascites   . Blood transfusion    hx of 2011   . Complication of anesthesia    Sodium drops per pt   . Cystitis   . DIARRHEA, ANTIBIOTIC ASSOCIATED 05/31/2009   Qualifier: Diagnosis of  By: Tommy Medal MD, Roderic Scarce    . GERD (gastroesophageal reflux disease)   . H/O hiatal hernia   .  Hyperlipidemia   . Hypertension   . Hyponatremia   . Neutropenia with fever ([place]) 05/18/2011  . OSTEOMYELITIS, CHRONIC, LOWER LEG 04/23/2009   Qualifier: Diagnosis of  By: Johnnye Sima MD, Dellis Filbert    . Osteopenia   . OSTEOPOROSIS 04/23/2009   Qualifier: Diagnosis of  By: Johnnye Sima MD, Dellis Filbert    . Ovarian cancer ([place]) 01/25/2011  . Pneumonia    hx of   . Postmenopausal atrophic vaginitis   . PROSTHETIC JOINT COMPLICATION 2/40/9735   Qualifier: Diagnosis of  By: Tommy Medal MD, Roderic Scarce    . Renal disorder    Decreased kidney function  . Staph infection 2010   after knee replacement  . Urinary frequency   . Vulvitis     Past Surgical History:  Procedure Laterality Date  . ABDOMINAL HYSTERECTOMY  04/01/2011   Procedure: HYSTERECTOMY ABDOMINAL;  Surgeon: Alvino Chapel, MD;  Location: [facility];  Service: Gynecology;  Laterality: N/A;  . APPENDECTOMY    . JOINT REPLACEMENT     R knee in 2008, 5 operations on L knee  . LAPAROTOMY  04/01/2011   Procedure: EXPLORATORY LAPAROTOMY;  Surgeon: Alvino Chapel, MD;  Location: [facility];  Service: Gynecology;  Laterality: N/A;  . OTHER SURGICAL HISTORY     hx of C section 1966  . SALPINGOOPHORECTOMY  04/01/2011   Procedure: SALPINGO OOPHERECTOMY;  Surgeon: Alvino Chapel, MD;  Location: [facility];  Service: Gynecology;  Laterality: Bilateral;  . TONSILLECTOMY      Family History  Problem Relation  Age of Onset  . Cancer Other        Bladder cancer  . Heart attack Brother   . Diabetes Brother     Social History:  reports that she quit smoking about 68 years ago. She has never used smokeless tobacco. She reports previous alcohol use. She reports that she does not use drugs.  Allergies:  Allergies  Allergen Reactions  . Morphine And Related Other (See Comments)    Given after knee replacement and code blue occurred  . Penicillins Hives and Itching    syncope  . Nsaids Other (See Comments)    abn kidney test  . Shellfish  Allergy Nausea And Vomiting and Other (See Comments)    Pt has shellfish allergy only.  Has had IV contrast x 2 and did fine.    Medications:  Scheduled: . docusate sodium  100 mg Oral Daily  . enoxaparin (LOVENOX) injection  40 mg Subcutaneous Q24H  . melatonin  3 mg Oral QHS  . multivitamin  1 tablet Oral Daily  . pantoprazole  40 mg Oral BID  . polyethylene glycol  17 g Oral Daily  . sodium chloride flush  3 mL Intravenous Q12H  . sodium chloride  1 g Oral BID WC    BMP Latest Ref Rng & Units 04/23/2020 04/22/2020 04/21/2020  Glucose 70 - 99 mg/dL 142(H) 111(H) 102(H)  BUN 8 - 23 mg/dL 17 16 18   Creatinine 0.44 - 1.00 mg/dL 0.97 0.83 0.88  Sodium 135 - 145 mmol/L 128(L) 126(L) 128(L)  Potassium 3.5 - 5.1 mmol/L 4.2 4.1 4.0  Chloride 98 - 111 mmol/L 96(L) 98 99  CO2 22 - 32 mmol/L 25 22 23   Calcium 8.9 - 10.3 mg/dL 8.6(L) 8.4(L) 8.2(L)   CBC Latest Ref Rng & Units 04/23/2020 04/22/2020 04/21/2020  WBC 4.0 - 10.5 K/uL 6.7 8.4 6.4  Hemoglobin 12.0 - 15.0 g/dL 11.9(L) 10.8(L) 10.5(L)  Hematocrit 36.0 - 46.0 % 34.9(L) 32.5(L) 32.0(L)  Platelets 150 - 400 K/uL 336 322 293   Urinalysis    Component Value Date/Time   COLORURINE YELLOW 04/16/2020 2200   APPEARANCEUR HAZY (A) 04/16/2020 2200   LABSPEC 1.023 04/16/2020 2200   LABSPEC 1.010 04/21/2011 1324   PHURINE 7.0 04/16/2020 2200   GLUCOSEU NEGATIVE 04/16/2020 2200   HGBUR NEGATIVE 04/16/2020 2200   BILIRUBINUR NEGATIVE 04/16/2020 2200   BILIRUBINUR Negative 04/21/2011 1324   KETONESUR NEGATIVE 04/16/2020 2200   PROTEINUR NEGATIVE 04/16/2020 2200   UROBILINOGEN 0.2 05/18/2011 2014   NITRITE NEGATIVE 04/16/2020 2200   LEUKOCYTESUR TRACE (A) 04/16/2020 2200   LEUKOCYTESUR Moderate 04/21/2011 1324    No results found.  Review of Systems  Constitutional: Positive for fatigue. Negative for chills and fever.  HENT: Negative for congestion, nosebleeds, sinus pressure, sinus pain, sore throat and trouble swallowing.   Eyes:  Negative for pain, redness and visual disturbance.  Respiratory: Negative for cough, chest tightness, shortness of breath and wheezing.   Cardiovascular: Positive for chest pain. Negative for leg swelling.  Gastrointestinal: Negative for abdominal pain, nausea and vomiting.  Endocrine: Negative for polydipsia.  Genitourinary: Negative for dysuria, hematuria and urgency.  Musculoskeletal: Negative for arthralgias, gait problem and myalgias.  Neurological: Positive for dizziness, weakness and light-headedness. Negative for tremors.  Psychiatric/Behavioral: Negative for confusion.   Blood pressure 125/61, pulse 92, temperature 97.9 F (36.6 C), temperature source Oral, resp. rate 17, height 5\' 3"  (1.6 m), weight 77.5 kg, SpO2 95 %. Physical Exam Vitals and nursing note reviewed.  Constitutional:      Appearance: She is well-developed. She is ill-appearing.     Comments: Elderly woman sitting comfortably in recliner  HENT:     Head: Normocephalic and atraumatic.  Eyes:     Pupils: Pupils are equal, round, and reactive to light.  Neck:     Vascular: No JVD.  Cardiovascular:     Rate and Rhythm: Regular rhythm. Tachycardia present.     Heart sounds: Normal heart sounds. No murmur heard.  No systolic murmur is present.   Pulmonary:     Effort: Pulmonary effort is normal.     Breath sounds: Normal breath sounds.  Chest:     Chest wall: No tenderness.  Abdominal:     General: There is no abdominal bruit.     Palpations: Abdomen is soft. There is no mass.     Tenderness: There is no guarding.  Musculoskeletal:     Cervical back: Normal range of motion.     Right lower leg: No edema.     Left lower leg: No edema.  Skin:    General: Skin is warm.  Neurological:     General: No focal deficit present.     Mental Status: She is alert.  Psychiatric:        Mood and Affect: Mood is anxious.     Assessment/Plan: 1.  Hyponatremia: Acute on chronic.  She is euvolemic on exam after  getting intravenous fluids and has serum osmolality pending at this time.  Urine osmolality appears to be inappropriately elevated and suggests inappropriate ADH state.  I suspect that she has a reset osmostat at her advanced age and based on low albumin, possibly poor solute intake.  I will check a uric acid level to see whether this is a salt wasting syndrome that could explain her orthostatic dizziness and hyponatremia along with elevated urine osmolality.  She has significant (understandable) apprehension of any medications that can worsen her dizziness but is willing to try short-term furosemide.  If this worsens or orthostatic hypotension, would entertain the use of long-term demeclocycline especially with her advanced age after discussions regarding chronic toxicity.  She does not have any evidence of hypothyroidism or adrenal insufficiency based on ACTH stimulation test.  I would recommend stopping ACE inhibitor indefinitely.  I will send off for a CA-125 given prior history of ovarian cancer and SIADH suspicion.  No indication for tolvaptan or hypertonic saline. 2.  Orthostatic hypotension: Suspect autonomic dysfunction of unclear etiology; agree with discontinuing antihypertensive therapy with as needed treatment of supine hypertension. 3.  Hypomagnesemia: Likely secondary to poor intake, agree with supplementation via intravenous route. 4.  Protein calorie malnutrition: Recent albumin drop noted current hospitalization-continue nutritional supplementation.  PATEL,JAY K. 04/23/2020, 1:55 PM

## 2020-04-23 NOTE — Progress Notes (Signed)
PROGRESS NOTE    [name]  DJM:[id number] DOB: October 12, 1929 DOA: 04/16/2020 PCP: Hulan Fess, MD   Chief Complaint  Patient presents with  . Chest Pain    Brief Narrative:  Jennifer Grissom Longmireis [name] 87 y.o.femalewith medical history significant forhypertension, remote ovarian cancer, chronic hyponatremia who presents to the ED from SNF for evaluation of syncope and chest pain.History is supplemented by daughter at bedside.  Patient was recently admitted from 04/06/2020-04/11/2020 for syncopewith fall, suspected orthostatic versus micturition syncope in etiology+/- Ambien effect.TTE 04/06/2020 showed EF 70-75%, G1 DD, mild AR andAS. Her Ambien, Norvasc,and nadolol werediscontinued, lisinopril was continued on discharge. TED hose and advised to increase oral intake were recommended. She was discharged to SNF.  Daughter states that at her SNF, on the day of presentation, nursing reported that around 2 PM they were assisting her to ambulate to the bathroom when she became lightheaded/dizzy. They sat her on the commode and she developed [name] transient unresponsive episode. She did not void prior to this unresponsive period.Her blood pressure was reportedly 150s/99. Patient does state that she becomes easily lightheaded/dizzy when changing positions and soon after beginning to walk.  Around 3 PM patient developed [name] midsternal pressure-like chest discomfort without radiation. She did not have any associated diaphoresis, nausea, vomiting, or dyspnea. Symptoms have since resolved. She reports chronic heartburn/acid reflux related to [name] hiatal hernia which is notably different than the symptoms she was experiencing. Due to her chest discomfort she was sent to the ED for further evaluation.  Assessment & Plan:   Principal Problem:   Syncope Active Problems:   Hyponatremia   AKI (acute kidney injury) ([place])  Recurrent syncopalepisode with orthostatic hypotension. Positive  orthostatics 3/14 Repeat orthostatics daily - seemed to feel better today? Continue to monitor Thigh high ted hose, abdominal binder (when at SNF, can d/c overnight and place during daytime)  Hold BP meds PT assessment recommended SNF. Continue PT OT with assistance and fall precautions. TOC assisting with SNF placement. Cardiology consult, appreciate recs - 30 day monitor post d/c with cards, compression stockings, follow orthostatics  Hyponatremia Presumed component of hypovolemia with orthostatic hypotension S/p IVF Suspect component of SIADH is possible especially in setting of chronicity and urine sodium/osm's Continue salt tabs, fluid restriction TSH wnl in February.  Normal ACTH stim test. Has been relatively stable past several days, hasn't improved significantly - will ask renal to comment given difficulty with normalizing   Nondisplaced Proximal 3rd and 4th metatarsal fractures Maybe contributing to falls/orthostasis if causing difficulty with ambulation or increasing time in bed due to pain Post op shoe, wbat, PT/OT Can follow with orthopedics outpatient  Follow vitamin D level (wnl)  Anxiety Significant anxiety of patient which is factoring into issues above.  Fear/anxiety of falling/fainting.  She hasn't been herself since fall earlier in February.  She's apprehensive of getting up.  Difficult situation, more time in bed increases weakness/unsteadiness and orthostasis and risk of falls.  OOB to chair and working with therapy will be important.  Also tend to focus on numbers (sodium, blood pressure) leading to anxiety.  Tried to reassure patient/daughter.      Concern for esophageal perforation esophogram without evidence of perforation CXR today without acute cardiopulm disease  Aspiration: noted on esophagram, SLP evaluated today.  She denies any symptoms of coughing/choking while eating.  She's declined MBS with speech today.  Follow outpatient  Distal Esophageal  Thickening  GERD:  BID PPI She's declined endoscopic evaluation here - consider  outpatient GI follow up if "alarm symptoms" Follow with GI prn  Hernias Hiatal hernia and 2 large anterior abdominal wall hernias Follow outpatient with surgery as needed  Resolved chest pain: Patient with transient midsternal pressure-like chest discomfort without radiation. She says this felt different than her reflux symptoms. EKG is reassuring without acute ischemic changes - appears similar to priors. Troponin is weakly positive at 20, trended down to 18. No prior known cardiac history. Diffuse coronary calcifications and aortic atherosclerosis noted on CTA on admission. Cardiology following, outpatient follow up  Resolving AKI likely prerenal in the setting of dehydration: Creatinine 1.12 on admission up from 0.85 one week ago. Likely prerenal from hypovolemia. Improving, follow   Hypertension Hold antihypertensives in setting of orthostatic hypotension - likely will have to allow some degree of hypertension due to her orthostatic hypotension which is symptomatic  DVT prophylaxis: lovenox Code Status: full  Family Communication: daughter at over phone 3/14 Disposition:   Status is: Inpatient  Remains inpatient appropriate because:Inpatient level of care appropriate due to severity of illness   Dispo:  Patient From: [facility]  Planned Disposition: [facility]  Medically stable for discharge: No     Consultants:   GERD  cardiology  Procedures:   none  Antimicrobials:  Anti-infectives (From admission, onward)   None     Subjective: Worried about sodium and blood pressure today  Objective: Vitals:   04/23/20 0300 04/23/20 0400 04/23/20 0500 04/23/20 0815  BP: (!) 102/52   125/61  Pulse: 78   92  Resp: (!) 22   17  Temp: 98.6 F (37 C) 98.4 F (36.9 C)  97.9 F (36.6 C)  TempSrc: Oral Oral  Oral  SpO2: 95%   95%  Weight:   77.5 kg    Height:        Intake/Output Summary (Last 24 hours) at 04/23/2020 1320 Last data filed at 04/23/2020 1100 Gross per 24 hour  Intake 713 ml  Output 800 ml  Net -87 ml   Filed Weights   04/21/20 0500 04/22/20 0500 04/23/20 0500  Weight: 75.4 kg 75.8 kg 77.5 kg    Examination:  General: No acute distress. Cardiovascular: Heart sounds show Nillie Bartolotta regular rate, and rhythm.  Lungs: Clear to auscultation bilaterally  Abdomen: Soft, nontender, nondistended Neurological: Alert and oriented 3. Moves all extremities 4. Cranial nerves II through XII grossly intact. Skin: Warm and dry. No rashes or lesions. Extremities: No clubbing or cyanosis. No edema.   Data Reviewed: I have personally reviewed following labs and imaging studies  CBC: Recent Labs  Lab 04/19/20 0945 04/20/20 0024 04/21/20 0055 04/22/20 0109 04/23/20 0652  WBC 7.9 7.0 6.4 8.4 6.7  NEUTROABS 5.3 4.0 3.6 5.6 4.4  HGB 11.7* 10.7* 10.5* 10.8* 11.9*  HCT 35.1* 32.4* 32.0* 32.5* 34.9*  MCV 85.2 85.3 85.8 84.9 83.9  PLT 316 306 293 322 518    Basic Metabolic Panel: Recent Labs  Lab 04/19/20 0945 04/20/20 0024 04/21/20 0055 04/22/20 0109 04/23/20 0652  NA 127* 128* 128* 126* 128*  K 4.1 4.4 4.0 4.1 4.2  CL 97* 99 99 98 96*  CO2 23 24 23 22 25   GLUCOSE 132* 104* 102* 111* 142*  BUN 20 21 18 16 17   CREATININE 0.97 0.94 0.88 0.83 0.97  CALCIUM 8.5* 8.5* 8.2* 8.4* 8.6*  MG 1.5* 2.1 1.6* 1.6* 1.6*  PHOS 2.2* 2.9 3.0 2.8 2.6    GFR: Estimated Creatinine Clearance: 39.5 mL/min (by C-G formula  based on SCr of 0.97 mg/dL).  Liver Function Tests: Recent Labs  Lab 04/19/20 0945 04/20/20 0024 04/21/20 0055 04/22/20 0109 04/23/20 0652  AST 26 23 20 22  33  ALT 18 18 16 16 25   ALKPHOS 80 75 74 75 103  BILITOT 0.9 0.6 0.8 1.0 0.9  PROT 6.5 6.1* 6.0* 6.5 6.9  ALBUMIN 2.7* 2.5* 2.4* 2.5* 2.5*    CBG: Recent Labs  Lab 04/22/20 0616 04/22/20 1149 04/22/20 1752 04/23/20 0609 04/23/20 1144  GLUCAP 93  109* 123* 121* 191*     Recent Results (from the past 240 hour(s))  SARS CORONAVIRUS 2 (TAT 6-24 HRS) Nasopharyngeal Nasopharyngeal Swab     Status: None   Collection Time: 04/16/20  8:56 PM   Specimen: Nasopharyngeal Swab  Result Value Ref Range Status   SARS Coronavirus 2 NEGATIVE NEGATIVE Final    Comment: (NOTE) SARS-CoV-2 target nucleic acids are NOT DETECTED.  The SARS-CoV-2 RNA is generally detectable in upper and lower respiratory specimens during the acute phase of infection. Negative results do not preclude SARS-CoV-2 infection, do not rule out co-infections with other pathogens, and should not be used as the sole basis for treatment or other patient management decisions. Negative results must be combined with clinical observations, patient history, and epidemiological information. The expected result is Negative.  Fact Sheet for Patients: SugarRoll.be  Fact Sheet for Healthcare Providers: https://www.woods-mathews.com/  This test is not yet approved or cleared by the [place] FDA and  has been authorized for detection and/or diagnosis of SARS-CoV-2 by FDA under an Emergency Use Authorization (EUA). This EUA will remain  in effect (meaning this test can be used) for the duration of the COVID-19 declaration under Se ction 564(b)(1) of the Act, 21 U.S.C. section 360bbb-3(b)(1), unless the authorization is terminated or revoked sooner.  Performed at [facility], [place] [address], [place], [place] [address]   Culture, blood (routine x 2)     Status: None   Collection Time: 04/16/20  9:10 PM   Specimen: BLOOD  Result Value Ref Range Status   Specimen Description BLOOD SITE NOT SPECIFIED  Final   Special Requests   Final    BOTTLES DRAWN AEROBIC AND ANAEROBIC Blood Culture adequate volume   Culture   Final    NO GROWTH 5 DAYS Performed at [facility], [address], [place], [place] [address]    Report  Status 04/21/2020 FINAL  Final  Culture, blood (routine x 2)     Status: None   Collection Time: 04/16/20  9:11 PM   Specimen: BLOOD  Result Value Ref Range Status   Specimen Description BLOOD SITE NOT SPECIFIED  Final   Special Requests   Final    BOTTLES DRAWN AEROBIC AND ANAEROBIC Blood Culture adequate volume   Culture   Final    NO GROWTH 5 DAYS Performed at [facility], [place] [address], [place], [place] [address]    Report Status 04/21/2020 FINAL  Final  MRSA PCR Screening     Status: None   Collection Time: 04/17/20  2:08 AM   Specimen: Nasal Mucosa; Nasopharyngeal  Result Value Ref Range Status   MRSA by PCR NEGATIVE NEGATIVE Final    Comment:        The GeneXpert MRSA Assay (FDA approved for NASAL specimens only), is one component of [facility] comprehensive MRSA colonization surveillance program. It is not intended to diagnose MRSA infection nor to guide or monitor treatment for MRSA  infections. Performed at [facility], [place] [address] [place], [place] [address]   SARS CORONAVIRUS 2 (TAT 6-24 HRS) Nasopharyngeal     Status: None   Collection Time: 04/19/20  9:50 AM   Specimen: Nasopharyngeal  Result Value Ref Range Status   SARS Coronavirus 2 NEGATIVE NEGATIVE Final    Comment: (NOTE) SARS-CoV-2 target nucleic acids are NOT DETECTED.  The SARS-CoV-2 RNA is generally detectable in upper and lower respiratory specimens during the acute phase of infection. Negative results do not preclude SARS-CoV-2 infection, do not rule out co-infections with other pathogens, and should not be used as the sole basis for treatment or other patient management decisions. Negative results must be combined with clinical observations, patient history, and epidemiological information. The expected result is Negative.  Fact Sheet for Patients: SugarRoll.be  Fact Sheet for Healthcare Providers: https://www.woods-mathews.com/  This test  is not yet approved or cleared by the [place] FDA and  has been authorized for detection and/or diagnosis of SARS-CoV-2 by FDA under an Emergency Use Authorization (EUA). This EUA will remain  in effect (meaning this test can be used) for the duration of the COVID-19 declaration under Se ction 564(b)(1) of the Act, 21 U.S.C. section 360bbb-3(b)(1), unless the authorization is terminated or revoked sooner.  Performed at [facility], [place] [address], [place], [place] [address]          Radiology Studies: No results found.      Scheduled Meds: . docusate sodium  100 mg Oral Daily  . enoxaparin (LOVENOX) injection  40 mg Subcutaneous Q24H  . melatonin  3 mg Oral QHS  . multivitamin  1 tablet Oral Daily  . pantoprazole  40 mg Oral BID  . polyethylene glycol  17 g Oral Daily  . sodium chloride flush  3 mL Intravenous Q12H  . sodium chloride  1 g Oral BID WC   Continuous Infusions:    LOS: 6 days    Time spent: over 30 min    Fayrene Helper, MD [facility]   To contact the attending provider between 7A-7P or the covering provider during after hours 7P-7A, please log into the web site [email or web address] and access using universal [place] password for that web site. If you do not have the password, please call the hospital operator.  04/23/2020, 1:20 PM

## 2020-04-23 NOTE — Care Management Important Message (Signed)
Important Message  Patient Details  Name: [name] MRN: [id number] Date of Birth: Jan 11, 1930   Medicare Important Message Given:  Yes     [name] 04/23/2020, 4:04 PM

## 2020-04-23 NOTE — Progress Notes (Signed)
Pt asked this RN to recheck her sodium level as she felt it was low. Rn asked pt to clarify. Pt endorses "I feel weak". RN notified patient of this morning's serum sodium level. Pt states that is low for her and asks for a Gatorade from her personal items. Patient drank 324mL of orange gatorade containing 160mg  of sodium.

## 2020-04-23 NOTE — Plan of Care (Signed)
  Problem: Clinical Measurements: Goal: Ability to maintain clinical measurements within normal limits will improve Outcome: Progressing   Problem: Clinical Measurements: Goal: Diagnostic test results will improve Outcome: Progressing   Problem: Activity: Goal: Risk for activity intolerance will decrease Outcome: Progressing   Problem: Nutrition: Goal: Adequate nutrition will be maintained Outcome: Progressing   Problem: Skin Integrity: Goal: Risk for impaired skin integrity will decrease Outcome: Progressing

## 2020-04-24 ENCOUNTER — Other Ambulatory Visit: Payer: Self-pay

## 2020-04-24 ENCOUNTER — Encounter (HOSPITAL_COMMUNITY): Payer: Self-pay | Admitting: Internal Medicine

## 2020-04-24 DIAGNOSIS — R55 Syncope and collapse: Secondary | ICD-10-CM | POA: Diagnosis not present

## 2020-04-24 LAB — CBC WITH DIFFERENTIAL/PLATELET
Abs Immature Granulocytes: 0.04 10*3/uL (ref 0.00–0.07)
Basophils Absolute: 0 10*3/uL (ref 0.0–0.1)
Basophils Relative: 1 %
Eosinophils Absolute: 0.2 10*3/uL (ref 0.0–0.5)
Eosinophils Relative: 3 %
HCT: 31 % — ABNORMAL LOW (ref 36.0–46.0)
Hemoglobin: 10.3 g/dL — ABNORMAL LOW (ref 12.0–15.0)
Immature Granulocytes: 1 %
Lymphocytes Relative: 24 %
Lymphs Abs: 1.7 10*3/uL (ref 0.7–4.0)
MCH: 28.2 pg (ref 26.0–34.0)
MCHC: 33.2 g/dL (ref 30.0–36.0)
MCV: 84.9 fL (ref 80.0–100.0)
Monocytes Absolute: 0.9 10*3/uL (ref 0.1–1.0)
Monocytes Relative: 13 %
Neutro Abs: 4.2 10*3/uL (ref 1.7–7.7)
Neutrophils Relative %: 58 %
Platelets: 332 10*3/uL (ref 150–400)
RBC: 3.65 MIL/uL — ABNORMAL LOW (ref 3.87–5.11)
RDW: 14.8 % (ref 11.5–15.5)
WBC: 7.1 10*3/uL (ref 4.0–10.5)
nRBC: 0 % (ref 0.0–0.2)

## 2020-04-24 LAB — GLUCOSE, CAPILLARY
Glucose-Capillary: 105 mg/dL — ABNORMAL HIGH (ref 70–99)
Glucose-Capillary: 124 mg/dL — ABNORMAL HIGH (ref 70–99)
Glucose-Capillary: 96 mg/dL (ref 70–99)

## 2020-04-24 LAB — COMPREHENSIVE METABOLIC PANEL
ALT: 20 U/L (ref 0–44)
AST: 23 U/L (ref 15–41)
Albumin: 2.2 g/dL — ABNORMAL LOW (ref 3.5–5.0)
Alkaline Phosphatase: 77 U/L (ref 38–126)
Anion gap: 5 (ref 5–15)
BUN: 23 mg/dL (ref 8–23)
CO2: 25 mmol/L (ref 22–32)
Calcium: 8.3 mg/dL — ABNORMAL LOW (ref 8.9–10.3)
Chloride: 97 mmol/L — ABNORMAL LOW (ref 98–111)
Creatinine, Ser: 0.97 mg/dL (ref 0.44–1.00)
GFR, Estimated: 56 mL/min — ABNORMAL LOW (ref >60)
Glucose, Bld: 109 mg/dL — ABNORMAL HIGH (ref 70–99)
Potassium: 4.3 mmol/L (ref 3.5–5.1)
Sodium: 127 mmol/L — ABNORMAL LOW (ref 135–145)
Total Bilirubin: 0.7 mg/dL (ref 0.3–1.2)
Total Protein: 5.8 g/dL — ABNORMAL LOW (ref 6.5–8.1)

## 2020-04-24 LAB — PHOSPHORUS: Phosphorus: 3.1 mg/dL (ref 2.5–4.6)

## 2020-04-24 LAB — RESP PANEL BY RT-PCR (FLU A&B, COVID) ARPGX2
Influenza A by PCR: NEGATIVE
Influenza B by PCR: NEGATIVE
SARS Coronavirus 2 by RT PCR: NEGATIVE

## 2020-04-24 LAB — MAGNESIUM: Magnesium: 1.6 mg/dL — ABNORMAL LOW (ref 1.7–2.4)

## 2020-04-24 MED ORDER — MAGNESIUM OXIDE 400 (241.3 MG) MG PO TABS: 400.0000 mg | ORAL_TABLET | Freq: Every day | ORAL | 0 refills | Status: DC

## 2020-04-24 MED ORDER — PANTOPRAZOLE SODIUM 40 MG PO TBEC: 40.0000 mg | DELAYED_RELEASE_TABLET | Freq: Two times a day (BID) | ORAL | 0 refills | Status: DC

## 2020-04-24 MED ORDER — FUROSEMIDE 20 MG PO TABS: 10.0000 mg | ORAL_TABLET | Freq: Every day | ORAL | 0 refills | Status: DC

## 2020-04-24 MED ORDER — MAGNESIUM SULFATE 2 GM/50ML IV SOLN
2.0000 g | Freq: Once | INTRAVENOUS | Status: AC
  Administered 2020-04-24: 2 g via INTRAVENOUS
  Filled 2020-04-24: qty 50

## 2020-04-24 NOTE — Plan of Care (Signed)
  Problem: Education: Goal: Knowledge of General Education information will improve Description: Including pain rating scale, medication(s)/side effects and non-pharmacologic comfort measures 04/24/2020 2053 by Lubertha South, RN Outcome: Adequate for Discharge   Problem: Clinical Measurements: Goal: Ability to maintain clinical measurements within normal limits will improve 04/24/2020 2053 by Lubertha South, RN Outcome: Adequate for Discharge   Problem: Health Behavior/Discharge Planning: Goal: Ability to manage health-related needs will improve 04/24/2020 2053 by Lubertha South, RN Outcome: Adequate for Discharge   Problem: Activity: Goal: Risk for activity intolerance will decrease 04/24/2020 2053 by Lubertha South, RN Outcome: Adequate for Discharge   Problem: Safety: Goal: Ability to remain free from injury will improve 04/24/2020 2053 by Lubertha South, RN Outcome: Adequate for Discharge

## 2020-04-24 NOTE — Plan of Care (Signed)
  Problem: Education: Goal: Knowledge of General Education information will improve Description: Including pain rating scale, medication(s)/side effects and non-pharmacologic comfort measures 04/24/2020 1557 by Thressa Sheller, RN Outcome: Adequate for Discharge 04/24/2020 1557 by Thressa Sheller, RN Outcome: Progressing   Problem: Health Behavior/Discharge Planning: Goal: Ability to manage health-related needs will improve 04/24/2020 1557 by Thressa Sheller, RN Outcome: Adequate for Discharge 04/24/2020 1557 by Thressa Sheller, RN Outcome: Progressing   Problem: Clinical Measurements: Goal: Ability to maintain clinical measurements within normal limits will improve 04/24/2020 1557 by Thressa Sheller, RN Outcome: Adequate for Discharge 04/24/2020 1557 by Thressa Sheller, RN Outcome: Progressing Goal: Will remain free from infection 04/24/2020 1557 by Thressa Sheller, RN Outcome: Adequate for Discharge 04/24/2020 1557 by Thressa Sheller, RN Outcome: Progressing Goal: Diagnostic test results will improve 04/24/2020 1557 by Thressa Sheller, RN Outcome: Adequate for Discharge 04/24/2020 1557 by Thressa Sheller, RN Outcome: Progressing Goal: Respiratory complications will improve 04/24/2020 1557 by Thressa Sheller, RN Outcome: Adequate for Discharge 04/24/2020 1557 by Thressa Sheller, RN Outcome: Progressing Goal: Cardiovascular complication will be avoided 04/24/2020 1557 by Thressa Sheller, RN Outcome: Adequate for Discharge 04/24/2020 1557 by Thressa Sheller, RN Outcome: Progressing   Problem: Activity: Goal: Risk for activity intolerance will decrease 04/24/2020 1557 by Thressa Sheller, RN Outcome: Adequate for Discharge 04/24/2020 1557 by Thressa Sheller, RN Outcome: Progressing   Problem: Nutrition: Goal: Adequate nutrition will be maintained 04/24/2020 1557 by Thressa Sheller, RN Outcome: Adequate for Discharge 04/24/2020 1557 by  Thressa Sheller, RN Outcome: Progressing   Problem: Coping: Goal: Level of anxiety will decrease 04/24/2020 1557 by Thressa Sheller, RN Outcome: Adequate for Discharge 04/24/2020 1557 by Thressa Sheller, RN Outcome: Progressing   Problem: Elimination: Goal: Will not experience complications related to bowel motility 04/24/2020 1557 by Thressa Sheller, RN Outcome: Adequate for Discharge 04/24/2020 1557 by Thressa Sheller, RN Outcome: Progressing   Problem: Safety: Goal: Ability to remain free from injury will improve 04/24/2020 1557 by Thressa Sheller, RN Outcome: Adequate for Discharge 04/24/2020 1557 by Thressa Sheller, RN Outcome: Progressing   Problem: Skin Integrity: Goal: Risk for impaired skin integrity will decrease 04/24/2020 1557 by Thressa Sheller, RN Outcome: Adequate for Discharge 04/24/2020 1557 by Thressa Sheller, RN Outcome: Progressing

## 2020-04-24 NOTE — Discharge Summary (Signed)
Physician Discharge Summary  [name] UKG:[id number] DOB: 11/24/1929 DOA: 04/16/2020  PCP: Hulan Fess, MD  Admit date: 04/16/2020 Discharge date: 04/24/2020  Time spent: 40 minutes  Recommendations for Outpatient Follow-up:  1. Follow outpatient CBC/CMP 2. Follow repeat BMP in 72 hrs (on 3/18) - pending results, would repeat again in 1 week 3/25 (or per SNF PCP discretion) 3. Lasix x 3 days for hyponatremia, 1200 ml fluid restriction 4. For orthostatic hypotension - continue thigh high compression stockings during the day (can discontinue overnight).  Continue abdominal binder as tolerated.  Change position slowly and with assistance.  Continue therapy with PT/OT. 5. Aspiration noted on esophagram, SLP recommend MBS -> she declined - follow with SLP outpatient 6. Discharge on BID PPI for GERD, can follow with GI outpatient prn - can transition to daily PPI after [name] month (GI recomending daily indefinitely)  7. Abdominal wall hernias - follow with surgery outpatient  8. Follow with cardiology outpatient for outpatient event monitor   Discharge Diagnoses:  Principal Problem:   Syncope Active Problems:   Hyponatremia   AKI (acute kidney injury) [facility])   Discharge Condition: stable  Diet recommendation: heart healthy, 1200 cc fluid restriction  Filed Weights   04/22/20 0500 04/23/20 0500 04/24/20 0500  Weight: 75.8 kg 77.5 kg 76.6 kg    History of present illness:  Per HPI Jennifer Austin Longmireis [name] 87 y.o.femalewith medical history significant forhypertension, remote ovarian cancer, chronic hyponatremia who presents to the ED from SNF for evaluation of syncope and chest pain.History is supplemented by daughter at bedside.  Patient was recently admitted from 04/06/2020-04/11/2020 for syncopewith fall, suspected orthostatic versus micturition syncope in etiology+/- Ambien effect.TTE 04/06/2020 showed EF 70-75%, G1 DD, mild AR andAS. Her Ambien, Norvasc,and nadolol  werediscontinued, lisinopril was continued on discharge. TED hose and advised to increase oral intakewererecommended. She was discharged to SNF.  Daughter states that at her SNF,onthe day ofpresentation,nursing reported that around 2 PM they were assisting her to ambulate to the bathroom when she became lightheaded/dizzy. They sat her on the commode and she developed [name] transient unresponsive episode. She did not void prior to this unresponsive period.Her blood pressure was reportedly 150s/99. Patient does state that she becomes easily lightheaded/dizzy when changing positions and soon after beginning to walk.  Around 3 PM patient developed [name] midsternal pressure-like chest discomfort without radiation. She did not have any associated diaphoresis, nausea, vomiting, or dyspnea. Symptoms have since resolved. She reports chronic heartburn/acid reflux related to [name] hiatal hernia which is notably different than the symptoms she was experiencing. Due to her chest discomfort she was sent to the ED for further evaluation.  She was admitted with recurrent orthostatic hypotension and syncope.  She's been treated with IVF, compression stockings, abdominal binder for orthostatic hypotension.  Cardiology was c/s and is planning outpatient monitor.  Hospitalization c/b abdominal imaging concerning for esophageal perforation, esophagram reassuring.  Hospitalization also c/b hyponatremia, likely 2/2 reset osmostat/SIADH.  Discharging with lasix and fluid restriction.    See below for additional details  Hospital Course:  Recurrent syncopalepisode with orthostatichypotension. Positive orthostatics 3/14 Repeat orthostatics daily - feeling better today Thigh high ted hose, abdominal binder (when at SNF, can d/c overnight and place during daytime)  Hold BP meds PT assessment recommended SNF. Continue PT OT with assistance and fall precautions. TOC assisting with SNF placement. Cardiology consult,  appreciate recs - 30 day monitor post d/c with cards, compression stockings, follow orthostatics  Hyponatremia  Presumed component of  hypovolemia with orthostatic hypotension Appreciate renal assistance - likely reset osmostat and SIADH - recommended d/c with lasix x3 days and fluid restriction - repeat sodium on 3/18.   TSH wnl in February.  Normal ACTH stim test.  Nondisplaced Proximal 3rd and 4th metatarsal fractures Maybe contributing to falls/orthostasis if causing difficulty with ambulation or increasing time in bed due to pain Post op shoe, wbat, PT/OT Can follow with orthopedics outpatient  Follow vitamin D level (wnl)  Anxiety Significant anxiety of patient which is factoring into issues above.  Fear/anxiety of falling/fainting.  She hasn't been herself since fall earlier in February.  She's apprehensive of getting up.  Difficult situation, more time in bed increases weakness/unsteadiness and orthostasis and risk of falls.  OOB to chair and working with therapy will be important.  Also tend to focus on numbers (sodium, blood pressure) leading to anxiety.  Tried to reassure patient/daughter.      Concern for esophageal perforation esophogram without evidence of perforation CXR today without acute cardiopulm disease  Aspiration: noted on esophagram, SLP evaluated today.  She denies any symptoms of coughing/choking while eating.  She's declined MBS with speech today.  Follow outpatient  Distal Esophageal Thickening  GERD:  BID PPI x1 month, then daily She's declined endoscopic evaluation here - consider outpatient GI follow up if "alarm symptoms" Follow with GI prn  Hernias Hiatal hernia and 2 large anterior abdominal wall hernias Follow outpatient with surgery   Resolved chest pain: Patient with transient midsternal pressure-like chest discomfort without radiation. She says this felt different than her reflux symptoms. EKG is reassuring without acute ischemic changes  - appears similar to priors. Troponin is weakly positive at 20,trended down to 18. No prior known cardiac history. Diffuse coronary calcifications and aortic atherosclerosis noted on CTA on admission. Cardiology following, outpatient follow up  ResolvingAKIlikely prerenal in the setting of dehydration: Creatinine 1.12 on admission up from 0.85oneweek ago. Likely prerenal from hypovolemia. Improving, follow   Hypertension Hold antihypertensives in setting of orthostatic hypotension - likely will have to allow some degree of hypertension due to her orthostatic hypotension which is symptomatic  Procedures:  none  Consultations:  Cardiology  GI  nephrology  Discharge Exam: Vitals:   04/24/20 1045 04/24/20 1050  BP: (!) 163/75 (!) 151/79  Pulse: 85   Resp: 20   Temp: 98.1 F (36.7 C)   SpO2: 95%    Feeling ebtter, feels encouraged about SNF Discussed POC with daughter  General: No acute distress. Cardiovascular: Heart sounds show Cayetano Mikita regular rate, and rhythm.  Lungs: Clear to auscultation bilaterally  Abdomen: Soft, nontender, nondistended Neurological: Alert and oriented 3. Moves all extremities 4. Cranial nerves II through XII grossly intact. Skin: Warm and dry. No rashes or lesions. Extremities: No clubbing or cyanosis. No edema.  Discharge Instructions   Discharge Instructions    Call MD for:  difficulty breathing, headache or visual disturbances   Complete by: As directed    Call MD for:  extreme fatigue   Complete by: As directed    Call MD for:  hives   Complete by: As directed    Call MD for:  persistant dizziness or light-headedness   Complete by: As directed    Call MD for:  persistant nausea and vomiting   Complete by: As directed    Call MD for:  redness, tenderness, or signs of infection (pain, swelling, redness, odor or green/yellow discharge around incision site)   Complete by: As directed  Call MD for:  severe uncontrolled pain    Complete by: As directed    Call MD for:  temperature >100.4   Complete by: As directed    Diet - low sodium heart healthy   Complete by: As directed    1200 cc fluid restriction   Discharge instructions   Complete by: As directed    You were seen for orthostatic hypotension and syncope as well as hyponatremia.  For your orthostatic hypotension, continue the thigh high compression stockings while awake.  Use the abdominal binder while awake if you tolerate this.  Change positions slowly and with help.  Continued rehab will be important.  We'll stop your blood pressure medicines and tolerate some more elevated blood pressures given the orthostasis that you have.   We have had the kidney doctors see you.  You probably have reset osmostat and SIADH.  We'll send you home with Zenon Leaf 1.2 L fluid restriction and 3 days of lasix (10 mg).  The SNF should follow repeat labs in about 3 days, then 1 week later.  For your reflux, continue the protonix twice daily for 1 month, then follow up with your PCP.  You can follow up with GI as an outpatient as you desire (or if you develop worsening symptoms).  Thurston Brendlinger modified barium swallow was recommended by our speech therapist.  You can follow up with speech therapy outpatient if this is something you desire.    You have Yarisbel Miranda post op shoe for the fractures in your right foot.  Use this when you're up and about.  Follow up with orthopedics outpatient.   Return for new, recurrent, or worsening symptoms.  Please ask your PCP to request records from this hospitalization so they know what was done and what the next steps will be.   Increase activity slowly   Complete by: As directed    No wound care   Complete by: As directed      Allergies as of 04/24/2020      Reactions   Morphine And Related Other (See Comments)   Given after knee replacement and code blue occurred   Penicillins Hives, Itching   syncope   Nsaids Other (See Comments)   abn kidney test    Shellfish Allergy Nausea And Vomiting, Other (See Comments)   Pt has shellfish allergy only.  Has had IV contrast x 2 and did fine.      Medication List    STOP taking these medications   lisinopril 20 MG tablet Commonly known as: ZESTRIL     TAKE these medications   acetaminophen 500 MG tablet Commonly known as: TYLENOL Take 2 tablets (1,000 mg total) by mouth 3 (three) times daily.   azelastine 0.1 % nasal spray Commonly known as: ASTELIN Place 1 spray into both nostrils at bedtime as needed for rhinitis or allergies.   calcium carbonate 750 MG chewable tablet Commonly known as: TUMS EX Chew 750 mg by mouth daily as needed for heartburn.   CENTRUM ADULTS PO Take 1 tablet by mouth daily.   diclofenac Sodium 1 % Gel Commonly known as: VOLTAREN Apply 2 g topically 4 (four) times daily. What changed: additional instructions   docusate sodium 100 MG capsule Commonly known as: COLACE Take 100 mg by mouth at bedtime as needed for mild constipation.   furosemide 20 MG tablet Commonly known as: LASIX Take 0.5 tablets (10 mg total) by mouth daily for 3 days. Start taking on: April 25, 2020  magnesium oxide 400 (241.3 Mg) MG tablet Commonly known as: MAG-OX Take 1 tablet (400 mg total) by mouth daily for 14 days.   meclizine 12.5 MG tablet Commonly known as: ANTIVERT Take 1 tablet (12.5 mg total) by mouth 2 (two) times daily as needed for dizziness.   melatonin 3 MG Tabs tablet Take 1 tablet (3 mg total) by mouth at bedtime.   pantoprazole 40 MG tablet Commonly known as: PROTONIX Take 1 tablet (40 mg total) by mouth 2 (two) times daily.   Restasis 0.05 % ophthalmic emulsion Generic drug: cycloSPORINE Place 1 drop into both eyes in the morning and at bedtime.   VITAMIN B COMPLEX PO Take 1 tablet by mouth daily.   Vitamin D 50 MCG (2000 UT) tablet Take 2,000 Units by mouth daily.   zinc oxide 20 % ointment Apply 1 application topically as needed for  irritation.      Allergies  Allergen Reactions  . Morphine And Related Other (See Comments)    Given after knee replacement and code blue occurred  . Penicillins Hives and Itching    syncope  . Nsaids Other (See Comments)    abn kidney test  . Shellfish Allergy Nausea And Vomiting and Other (See Comments)    Pt has shellfish allergy only.  Has had IV contrast x 2 and did fine.      The results of significant diagnostics from this hospitalization (including imaging, microbiology, ancillary and laboratory) are listed below for reference.    Significant Diagnostic Studies: CT ABDOMEN PELVIS WO CONTRAST  Addendum Date: 04/17/2020   ADDENDUM REPORT: 04/17/2020 16:01 ADDENDUM: Findings should include old healed right ninth and tenth rib fractures. Electronically Signed   By: Iven Finn M.D.   On: 04/17/2020 16:01   Result Date: 04/17/2020 CLINICAL DATA:  Nonlocalized acute abdominal pain. EXAM: CT ABDOMEN AND PELVIS WITHOUT CONTRAST TECHNIQUE: Multidetector CT imaging of the abdomen and pelvis was performed following the standard protocol without IV contrast. COMPARISON:  CT angiography chest 04/16/2020, CT abdomen pelvis 03/05/2011 FINDINGS: Lower chest: Persistent trace right pleural effusion. Bilateral lower lobe subsegmental atelectasis. At least small volume hiatal hernia with persistent distal esophageal thickening. Medial left base atelectasis (6:60); however, Caitlain Tweed component of trace inferior pneumomediastinum cannot be excluded (3: 4-11). Hepatobiliary: No focal liver abnormality. The gallbladder is contracted. Punctate calcific density within the gallbladder lumen likely represents Quaneshia Wareing gallstone. No gallbladder wall thickening or pericholecystic fluid. No biliary dilatation. Pancreas: No focal lesion. Normal pancreatic contour. No surrounding inflammatory changes. No main pancreatic ductal dilatation. Spleen: Normal in size without focal abnormality. Adrenals/Urinary Tract: No adrenal  nodule bilaterally. Punctate calcific densities within bilateral kidneys. No hydronephrosis and no contour-deforming renal mass. No ureterolithiasis or hydroureter. Fluid fluid level within the urinary bladder likely related to previously administered intravenous contrast for CT angiography chest 04/16/2020. Stomach/Bowel: Stomach is within normal limits. No evidence of bowel wall thickening or dilatation. Diffuse colonic diverticulosis. No pneumatosis. The appendix not definitely identified. Vascular/Lymphatic: Several punctate phleboliths are noted within the pelvis. Focal dilatation of the infrarenal abdominal aorta measuring up to 2.8 cm in diameter with no definite abdominal aorta or iliac aneurysm. Moderate to severe atherosclerotic plaque of the aorta and its branches. No abdominal, pelvic, or inguinal lymphadenopathy. Reproductive: Status post hysterectomy. No adnexal masses. Other: No intraperitoneal free fluid. No intraperitoneal free gas. No organized fluid collection. Musculoskeletal: Two large consecutive anterior abdominal wall hernias with the likely paraumbilical hernia containing Elek Holderness long segment of the mid to  distal transverse colon with an abdominal defect of 5.8 x 4.5 cm. The inferior-most hernia likely Lucyann Romano infraumbilical hernia contains several loops of small bowel and demonstrates Sabri Teal abdominal defect of approximately 5.8 x 3.7 cm. Grade 1 anterolisthesis of L4 on L5. Multilevel facet arthropathy. Visualized lower thoracic spine demonstrates multilevel osteophyte formation. No suspicious lytic or blastic osseous lesions. No acute displaced fracture. IMPRESSION: 1. Question trace inferior pneumomediastinum. In the setting of Katalyna Socarras small hiatal hernia and distal esophageal thickening, Trask Vosler small or contained esophageal perforation cannot be excluded. 2. Two large consecutive anterior abdominal wall hernias, one of which is likely paraumbilical, containing small and large bowel. No definite findings of  ischemia or associated bowel obstruction with however limited evaluation due to venous noncontrast study. 3. Colonic diverticulosis with no acute diverticulitis. 4. Nonobstructive punctate nephrolithiasis bilaterally. 5. Aortic Atherosclerosis (ICD10-I70.0). These results were called by telephone at the time of interpretation on 04/17/2020 at 3:45 pm to provider [facility] , who verbally acknowledged these results. Electronically Signed: By: Iven Finn M.D. On: 04/17/2020 15:52   DG Ankle Complete Right  Result Date: 04/06/2020 CLINICAL DATA:  Syncopal episode. EXAM: RIGHT ANKLE - COMPLETE 3+ VIEW COMPARISON:  June 09, 2017 FINDINGS: There is no evidence of an acute fracture, dislocation, or joint effusion. Loxley Schmale chronic deformity is seen involving the distal shaft of the right fibula. There is Zena Vitelli moderate-sized plantar calcaneal spur. Soft tissues are unremarkable. IMPRESSION: Chronic deformity of the distal right fibula. Electronically Signed   By: Virgina Norfolk M.D.   On: 04/06/2020 02:45   CT HEAD WO CONTRAST  Result Date: 04/06/2020 CLINICAL DATA:  Syncopal episode. EXAM: CT HEAD WITHOUT CONTRAST TECHNIQUE: Contiguous axial images were obtained from the base of the skull through the vertex without intravenous contrast. COMPARISON:  March 02, 2020 FINDINGS: Brain: There is mild cerebral atrophy with widening of the extra-axial spaces and ventricular dilatation. There are areas of decreased attenuation within the white matter tracts of the supratentorial brain, consistent with microvascular disease changes. Simuel Stebner stable 1 cm para falcine calcified right occipital lobe meningioma is noted. Vascular: No hyperdense vessels are identified. Skull: Nondisplaced bilateral nasal bone fractures of indeterminate age are seen. Sinuses/Orbits: Marked severity sphenoid sinus mucosal thickening is present. Other: Mild to moderate severity frontal scalp soft tissue swelling is noted along the midline. IMPRESSION: 1.  Mild to moderate severity frontal scalp soft tissue swelling without evidence of an acute fracture or acute intracranial abnormality. 2. Stable 1 cm para falcine calcified right occipital lobe meningioma. 3. Bilateral nondisplaced nasal bone fractures of indeterminate age. Electronically Signed   By: Virgina Norfolk M.D.   On: 04/06/2020 02:42   CT Angio Chest PE W and/or Wo Contrast  Result Date: 04/16/2020 CLINICAL DATA:  Chest pain, history of prior fall, initial encounter EXAM: CT ANGIOGRAPHY CHEST WITH CONTRAST TECHNIQUE: Multidetector CT imaging of the chest was performed using the standard protocol during bolus administration of intravenous contrast. Multiplanar CT image reconstructions and MIPs were obtained to evaluate the vascular anatomy. CONTRAST:  59mL OMNIPAQUE IOHEXOL 350 MG/ML SOLN COMPARISON:  Chest x-ray from earlier in the same day. FINDINGS: Cardiovascular: Thoracic aorta demonstrates atherosclerotic calcifications without aneurysmal dilatation. The degree of aortic enhancement is not sufficient to evaluate for dissection. No cardiac enlargement is seen. Diffuse coronary calcifications are noted. The pulmonary artery shows Bienvenido Proehl normal branching pattern. No filling defect to suggest pulmonary embolism is noted. Mediastinum/Nodes: Thoracic inlet is within normal limits. No sizable hilar or mediastinal adenopathy  is noted. Esophageal thickening is noted distally likely related to reflux. Hiatal hernia is seen. Lungs/Pleura: Mild bibasilar atelectatic changes are seen. Small right pleural effusion is noted. No sizable infiltrate is seen. No sizable parenchymal nodules are noted. Upper Abdomen: Visualized upper abdomen is otherwise within normal limits. Musculoskeletal: Degenerative changes of the thoracic spine are seen. No acute rib abnormality is seen. Review of the MIP images confirms the above findings. IMPRESSION: No evidence of pulmonary emboli. Small right pleural effusion and bibasilar  atelectatic changes. Hiatal hernia with distal esophageal thickening likely related to reflux. Aortic Atherosclerosis (ICD10-I70.0). Electronically Signed   By: Inez Catalina M.D.   On: 04/16/2020 19:55   DG CHEST PORT 1 VIEW  Result Date: 04/18/2020 CLINICAL DATA:  History of prior fall.  History of chest pain. EXAM: PORTABLE CHEST 1 VIEW COMPARISON:  CT 04/16/2020.  Chest x-ray 04/16/2020. FINDINGS: Mediastinum hilar structures normal. Heart size normal. No focal infiltrate. No pleural effusion or pneumothorax. Prominent skin folds noted. Degenerative change thoracic spine. IMPRESSION: No acute cardiopulmonary disease.  Chest is stable from prior exam. Electronically Signed   By: Marcello Moores  Register   On: 04/18/2020 07:39   DG Chest Portable 1 View  Result Date: 04/16/2020 CLINICAL DATA:  Recent fall, chest pain EXAM: PORTABLE CHEST 1 VIEW COMPARISON:  08/04/2019 FINDINGS: Single frontal view of the chest demonstrates an unremarkable cardiac silhouette. No airspace disease, effusion, or pneumothorax. No acute bony abnormalities. IMPRESSION: 1. No acute intrathoracic process. Electronically Signed   By: Randa Ngo M.D.   On: 04/16/2020 18:49   DG Foot 2 Views Right  Result Date: 04/18/2020 CLINICAL DATA:  Pain following recent fall EXAM: RIGHT FOOT - 2 VIEW COMPARISON:  March 19, 2020. FINDINGS: Frontal and lateral views obtained. Subtle transverse lucencies in the proximal third and fourth metatarsals are stable, concerning for nondisplaced fractures. Evidence of old trauma with remodeling distal fourth metatarsal. No evidence of new fracture compared to prior study. No dislocation. There is underlying osteoporosis. No appreciable joint space narrowing. There is an inferior calcaneal spur. There is mild spurring in the dorsal midfoot. Foci of arterial vascular calcification noted. IMPRESSION: Persistent subtle transverse lucencies in the proximal third and proximal fourth metatarsals, unchanged from  prior study and likely representing subtle nondisplaced fractures in these areas. Old healed fracture distal fourth metatarsal with remodeling. No acute fracture compared to prior study. No dislocation. Mild spurring dorsal midfoot. Inferior calcaneal spur. Foci of arterial vascular atherosclerotic calcification noted. Electronically Signed   By: Lowella Grip III M.D.   On: 04/18/2020 11:16   DG Foot Complete Right  Result Date: 04/08/2020 CLINICAL DATA:  Lateral foot pain and swelling. EXAM: RIGHT FOOT COMPLETE - 3+ VIEW COMPARISON:  None. FINDINGS: Old fourth metatarsal fracture. Mild diffuse decreased bone mineralization. Mild degenerate changes over the midfoot and hindfoot. Small inferior calcaneal spur. Subtle transverse lucencies at the base of the third and fourth metatarsals on the oblique view likely within normal although could not exclude subtle nondisplaced fractures. IMPRESSION: Subtle transverse lucencies at the base of the third and fourth metatarsals on the oblique view likely within normal, although could represent subtle nondisplaced fractures. Consider follow-up radiograph 7-10 days. Electronically Signed   By: Marin Olp M.D.   On: 04/08/2020 11:12   ECHOCARDIOGRAM COMPLETE  Result Date: 04/06/2020    ECHOCARDIOGRAM REPORT   Patient Name:   SAE HANDRICH [facility] Date of Exam: 04/06/2020 Medical Rec #:  [id number]  Height:       63.0 in Accession #:    [id number]       Weight:       155.0 lb Date of Birth:  [birth year]        BSA:          1.735 m Patient Age:    87 years         BP:           140/64 mmHg Patient Gender: F                HR:           84 bpm. Exam Location:  Inpatient Procedure: 2D Echo, Cardiac Doppler and Color Doppler Indications:    R55 Syncope  History:        Patient has prior history of Echocardiogram examinations, most                 recent 01/04/2019. Signs/Symptoms:Murmur, Shortness of Breath                 and Dyspnea; Risk Factors:Hypertension,  Dyslipidemia and Former                 Smoker.  Sonographer:    Roseanna Rainbow RDCS Referring Phys: [id number] Jennifer YATES  Sonographer Comments: Technically difficult study due to poor echo windows. Patient had recent fall, could not turn. Patient sensitive to pressure from probe in apical region. IMPRESSIONS  1. Left ventricular ejection fraction, by estimation, is 70 to 75%. The left ventricle has hyperdynamic function. The left ventricle has no regional wall motion abnormalities. There is mild left ventricular hypertrophy. Left ventricular diastolic parameters are consistent with Grade I diastolic dysfunction (impaired relaxation).  2. Right ventricular systolic function is normal. The right ventricular size is normal.  3. The mitral valve is normal in structure. No evidence of mitral valve regurgitation. No evidence of mitral stenosis.  4. The aortic valve is tricuspid. Aortic valve regurgitation is mild. Mild aortic valve stenosis. Aortic valve area, by VTI measures 1.91 cm. Aortic valve mean gradient measures 10.0 mmHg. Aortic valve Vmax measures 2.12 m/s.  5. The inferior vena cava is normal in size with greater than 50% respiratory variability, suggesting right atrial pressure of 3 mmHg. FINDINGS  Left Ventricle: Left ventricular ejection fraction, by estimation, is 70 to 75%. The left ventricle has hyperdynamic function. The left ventricle has no regional wall motion abnormalities. The left ventricular internal cavity size was normal in size. There is mild left ventricular hypertrophy. Left ventricular diastolic parameters are consistent with Grade I diastolic dysfunction (impaired relaxation). Right Ventricle: The right ventricular size is normal. No increase in right ventricular wall thickness. Right ventricular systolic function is normal. Left Atrium: Left atrial size was normal in size. Right Atrium: Right atrial size was normal in size. Pericardium: There is no evidence of pericardial effusion. Mitral  Valve: The mitral valve is normal in structure. Mild mitral annular calcification. No evidence of mitral valve regurgitation. No evidence of mitral valve stenosis. Tricuspid Valve: The tricuspid valve is normal in structure. Tricuspid valve regurgitation is not demonstrated. No evidence of tricuspid stenosis. Aortic Valve: The aortic valve is tricuspid. Aortic valve regurgitation is mild. Aortic regurgitation PHT measures 393 msec. Mild aortic stenosis is present. Aortic valve mean gradient measures 10.0 mmHg. Aortic valve peak gradient measures 18.0 mmHg. Aortic valve area, by VTI measures 1.91 cm. Pulmonic Valve: The pulmonic valve was normal in structure. Pulmonic valve regurgitation  is not visualized. No evidence of pulmonic stenosis. Aorta: The aortic root is normal in size and structure. Venous: The inferior vena cava is normal in size with greater than 50% respiratory variability, suggesting right atrial pressure of 3 mmHg. IAS/Shunts: No atrial level shunt detected by color flow Doppler.  LEFT VENTRICLE PLAX 2D LVIDd:         3.40 cm     Diastology LVIDs:         2.00 cm     LV e' medial:    6.96 cm/s LV PW:         1.10 cm     LV E/e' medial:  14.2 LV IVS:        1.30 cm     LV e' lateral:   4.90 cm/s LVOT diam:     1.90 cm     LV E/e' lateral: 20.1 LV SV:         93 LV SV Index:   54 LVOT Area:     2.84 cm  LV Volumes (MOD) LV vol d, MOD A2C: 44.9 ml LV vol d, MOD A4C: 56.2 ml LV vol s, MOD A2C: 9.0 ml LV vol s, MOD A4C: 13.8 ml LV SV MOD A2C:     35.9 ml LV SV MOD A4C:     56.2 ml LV SV MOD BP:      39.8 ml RIGHT VENTRICLE             IVC RV S prime:     20.00 cm/s  IVC diam: 1.90 cm TAPSE (M-mode): 3.2 cm LEFT ATRIUM             Index LA diam:        4.80 cm 2.77 cm/m LA Vol (A2C):   42.8 ml 24.67 ml/m LA Vol (A4C):   36.8 ml 21.21 ml/m LA Biplane Vol: 41.4 ml 23.86 ml/m  AORTIC VALVE AV Area (Vmax):    2.55 cm AV Area (Vmean):   2.25 cm AV Area (VTI):     1.91 cm AV Vmax:           212.00 cm/s  AV Vmean:          150.000 cm/s AV VTI:            0.486 m AV Peak Grad:      18.0 mmHg AV Mean Grad:      10.0 mmHg LVOT Vmax:         191.00 cm/s LVOT Vmean:        119.000 cm/s LVOT VTI:          0.328 m LVOT/AV VTI ratio: 0.67 AI PHT:            393 msec  AORTA Ao Root diam: 3.10 cm Ao Asc diam:  3.60 cm MITRAL VALVE MV Area (PHT): 3.72 cm     SHUNTS MV Decel Time: 204 msec     Systemic VTI:  0.33 m MV E velocity: 98.50 cm/s   Systemic Diam: 1.90 cm MV Harriett Azar velocity: 104.00 cm/s MV E/Jeree Delcid ratio:  0.95 Candee Furbish MD Electronically signed by Candee Furbish MD Signature Date/Time: 04/06/2020/3:25:34 PM    Final    CT Maxillofacial Wo Contrast  Result Date: 04/06/2020 CLINICAL DATA:  Syncopal episode. EXAM: CT MAXILLOFACIAL WITHOUT CONTRAST TECHNIQUE: Multidetector CT imaging of the maxillofacial structures was performed. Multiplanar CT image reconstructions were also generated. COMPARISON:  None. FINDINGS: Osseous: Bilateral nondisplaced nasal bone fractures of indeterminate age  are seen. Orbits: Negative. No traumatic or inflammatory finding. Sinuses: There is marked severity sphenoid sinus mucosal thickening. Soft tissues: Mild to moderate severity frontal scalp soft tissue swelling is seen, along the midline. Limited intracranial: No significant or unexpected finding. IMPRESSION: 1. Bilateral nondisplaced nasal bone fractures. 2. Mild to moderate severity frontal scalp soft tissue swelling. 3. Marked severity sphenoid sinus mucosal thickening. Electronically Signed   By: Virgina Norfolk M.D.   On: 04/06/2020 02:43   DG ESOPHAGUS W SINGLE CM (SOL OR THIN BA)  Result Date: 04/17/2020 CLINICAL DATA:  Possible distal esophageal perforation near the GE junction. EXAM: ESOPHOGRAM/BARIUM SWALLOW TECHNIQUE: Single contrast examination was performed using water-soluble contrast. FLUOROSCOPY TIME:  Fluoroscopy Time:  1.7 minutes Radiation Exposure Index (if provided by the fluoroscopic device): 21.5 mGy Number of  Acquired Spot Images: 0 COMPARISON:  CT abdomen pelvis from same day. Esophagram dated May 09, 2016. FINDINGS: Normal esophageal course and contour. No obstruction, stricture, or perforation. Unchanged small hiatal hernia. The procedure was terminated after the patient aspirated Zyah Gomm small amount of contrast. IMPRESSION: 1. No evidence of esophageal perforation. 2. Positive for aspiration. Consider modified barium swallow study for further evaluation. Electronically Signed   By: Titus Dubin M.D.   On: 04/17/2020 19:05    Microbiology: Recent Results (from the past 240 hour(s))  SARS CORONAVIRUS 2 (TAT 6-24 HRS) Nasopharyngeal Nasopharyngeal Swab     Status: None   Collection Time: 04/16/20  8:56 PM   Specimen: Nasopharyngeal Swab  Result Value Ref Range Status   SARS Coronavirus 2 NEGATIVE NEGATIVE Final    Comment: (NOTE) SARS-CoV-2 target nucleic acids are NOT DETECTED.  The SARS-CoV-2 RNA is generally detectable in upper and lower respiratory specimens during the acute phase of infection. Negative results do not preclude SARS-CoV-2 infection, do not rule out co-infections with other pathogens, and should not be used as the sole basis for treatment or other patient management decisions. Negative results must be combined with clinical observations, patient history, and epidemiological information. The expected result is Negative.  Fact Sheet for Patients: SugarRoll.be  Fact Sheet for Healthcare Providers: https://www.woods-mathews.com/  This test is not yet approved or cleared by the [place] FDA and  has been authorized for detection and/or diagnosis of SARS-CoV-2 by FDA under an Emergency Use Authorization (EUA). This EUA will remain  in effect (meaning this test can be used) for the duration of the COVID-19 declaration under Se ction 564(b)(1) of the Act, 21 U.S.C. section 360bbb-3(b)(1), unless the authorization is terminated  or revoked sooner.  Performed at [facility], [place] [address] [place], [place] [address]   Culture, blood (routine x 2)     Status: None   Collection Time: 04/16/20  9:10 PM   Specimen: BLOOD  Result Value Ref Range Status   Specimen Description BLOOD SITE NOT SPECIFIED  Final   Special Requests   Final    BOTTLES DRAWN AEROBIC AND ANAEROBIC Blood Culture adequate volume   Culture   Final    NO GROWTH 5 DAYS Performed at [facility], [address] [place], [place] [address]    Report Status 04/21/2020 FINAL  Final  Culture, blood (routine x 2)     Status: None   Collection Time: 04/16/20  9:11 PM   Specimen: BLOOD  Result Value Ref Range Status   Specimen Description BLOOD SITE NOT SPECIFIED  Final   Special Requests   Final    BOTTLES DRAWN AEROBIC AND  ANAEROBIC Blood Culture adequate volume   Culture   Final    NO GROWTH 5 DAYS Performed at [facility], [place] [address] [place], [place] [address]    Report Status 04/21/2020 FINAL  Final  MRSA PCR Screening     Status: None   Collection Time: 04/17/20  2:08 AM   Specimen: Nasal Mucosa; Nasopharyngeal  Result Value Ref Range Status   MRSA by PCR NEGATIVE NEGATIVE Final    Comment:        The GeneXpert MRSA Assay (FDA approved for NASAL specimens only), is one component of Sharaine Delange comprehensive MRSA colonization surveillance program. It is not intended to diagnose MRSA infection nor to guide or monitor treatment for MRSA infections. Performed at [facility], [place] [address], [place], [place] [address]   SARS CORONAVIRUS 2 (TAT 6-24 HRS) Nasopharyngeal     Status: None   Collection Time: 04/19/20  9:50 AM   Specimen: Nasopharyngeal  Result Value Ref Range Status   SARS Coronavirus 2 NEGATIVE NEGATIVE Final    Comment: (NOTE) SARS-CoV-2 target nucleic acids are NOT DETECTED.  The SARS-CoV-2 RNA is generally detectable in upper and lower respiratory specimens during the acute phase of  infection. Negative results do not preclude SARS-CoV-2 infection, do not rule out co-infections with other pathogens, and should not be used as the sole basis for treatment or other patient management decisions. Negative results must be combined with clinical observations, patient history, and epidemiological information. The expected result is Negative.  Fact Sheet for Patients: SugarRoll.be  Fact Sheet for Healthcare Providers: https://www.woods-mathews.com/  This test is not yet approved or cleared by the [place] FDA and  has been authorized for detection and/or diagnosis of SARS-CoV-2 by FDA under an Emergency Use Authorization (EUA). This EUA will remain  in effect (meaning this test can be used) for the duration of the COVID-19 declaration under Se ction 564(b)(1) of the Act, 21 U.S.C. section 360bbb-3(b)(1), unless the authorization is terminated or revoked sooner.  Performed at [facility], [place] [address] [place], [place] [address]   Resp Panel by RT-PCR (Flu Breella Vanostrand&B, Covid) Nasopharyngeal Swab     Status: None   Collection Time: 04/24/20 10:23 AM   Specimen: Nasopharyngeal Swab; Nasopharyngeal(NP) swabs in vial transport medium  Result Value Ref Range Status   SARS Coronavirus 2 by RT PCR NEGATIVE NEGATIVE Final    Comment: (NOTE) SARS-CoV-2 target nucleic acids are NOT DETECTED.  The SARS-CoV-2 RNA is generally detectable in upper respiratory specimens during the acute phase of infection. The lowest concentration of SARS-CoV-2 viral copies this assay can detect is 138 copies/mL. Januel Doolan negative result does not preclude SARS-Cov-2 infection and should not be used as the sole basis for treatment or other patient management decisions. Debbe Crumble negative result may occur with  improper specimen collection/handling, submission of specimen other than nasopharyngeal swab, presence of viral mutation(s) within the areas targeted by this  assay, and inadequate number of viral copies(<138 copies/mL). Verenis Nicosia negative result must be combined with clinical observations, patient history, and epidemiological information. The expected result is Negative.  Fact Sheet for Patients:  EntrepreneurPulse.com.au  Fact Sheet for Healthcare Providers:  IncredibleEmployment.be  This test is no t yet approved or cleared by the [place] FDA and  has been authorized for detection and/or diagnosis of SARS-CoV-2 by FDA under an Emergency Use Authorization (EUA). This EUA will remain  in effect (meaning this test can be used) for the duration of  the COVID-19 declaration under Section 564(b)(1) of the Act, 21 U.S.C.section 360bbb-3(b)(1), unless the authorization is terminated  or revoked sooner.       Influenza Arlena Marsan by PCR NEGATIVE NEGATIVE Final   Influenza B by PCR NEGATIVE NEGATIVE Final    Comment: (NOTE) The Xpert Xpress SARS-CoV-2/FLU/RSV plus assay is intended as an aid in the diagnosis of influenza from Nasopharyngeal swab specimens and should not be used as Munir Victorian sole basis for treatment. Nasal washings and aspirates are unacceptable for Xpert Xpress SARS-CoV-2/FLU/RSV testing.  Fact Sheet for Patients: EntrepreneurPulse.com.au  Fact Sheet for Healthcare Providers: IncredibleEmployment.be  This test is not yet approved or cleared by the [place] FDA and has been authorized for detection and/or diagnosis of SARS-CoV-2 by FDA under an Emergency Use Authorization (EUA). This EUA will remain in effect (meaning this test can be used) for the duration of the COVID-19 declaration under Section 564(b)(1) of the Act, 21 U.S.C. section 360bbb-3(b)(1), unless the authorization is terminated or revoked.  Performed at [facility], [place] [address] [place], [place] [address]      Labs: Basic Metabolic Panel: Recent Labs  Lab 04/20/20 0024 04/21/20 0055  04/22/20 0109 04/23/20 0652 04/24/20 0058  NA 128* 128* 126* 128* 127*  K 4.4 4.0 4.1 4.2 4.3  CL 99 99 98 96* 97*  CO2 24 23 22 25 25   GLUCOSE 104* 102* 111* 142* 109*  BUN 21 18 16 17 23   CREATININE 0.94 0.88 0.83 0.97 0.97  CALCIUM 8.5* 8.2* 8.4* 8.6* 8.3*  MG 2.1 1.6* 1.6* 1.6* 1.6*  PHOS 2.9 3.0 2.8 2.6 3.1   Liver Function Tests: Recent Labs  Lab 04/20/20 0024 04/21/20 0055 04/22/20 0109 04/23/20 0652 04/24/20 0058  AST 23 20 22  33 23  ALT 18 16 16 25 20   ALKPHOS 75 74 75 103 77  BILITOT 0.6 0.8 1.0 0.9 0.7  PROT 6.1* 6.0* 6.5 6.9 5.8*  ALBUMIN 2.5* 2.4* 2.5* 2.5* 2.2*   No results for input(s): LIPASE, AMYLASE in the last 168 hours. No results for input(s): AMMONIA in the last 168 hours. CBC: Recent Labs  Lab 04/20/20 0024 04/21/20 0055 04/22/20 0109 04/23/20 0652 04/24/20 0058  WBC 7.0 6.4 8.4 6.7 7.1  NEUTROABS 4.0 3.6 5.6 4.4 4.2  HGB 10.7* 10.5* 10.8* 11.9* 10.3*  HCT 32.4* 32.0* 32.5* 34.9* 31.0*  MCV 85.3 85.8 84.9 83.9 84.9  PLT 306 293 322 336 332   Cardiac Enzymes: No results for input(s): CKTOTAL, CKMB, CKMBINDEX, TROPONINI in the last 168 hours. BNP: BNP (last 3 results) No results for input(s): BNP in the last 8760 hours.  ProBNP (last 3 results) No results for input(s): PROBNP in the last 8760 hours.  CBG: Recent Labs  Lab 04/23/20 1144 04/23/20 1705 04/24/20 0050 04/24/20 0532 04/24/20 1043  GLUCAP 191* 130* 105* 96 124*       Signed:  Fayrene Helper MD.  [facility] 04/24/2020, 12:05 PM

## 2020-04-24 NOTE — Progress Notes (Signed)
Physical Therapy Treatment Patient Details Name: [name] MRN: [id number] DOB: [birth year] Today's Date: 04/24/2020    History of Present Illness Pt is an 87 y/o female admitted 3/7 following syncopal episode at [facility]. Pt with admission 2/25-3/2 for syncope with fall. PMH includes ovarian CA; HTN, HLD, chronic hyponatremia, insomnia    PT Comments    Pt supine on arrival and educated for benefit of WC with limited tolerance for standing due to orthostatic hypotension. Pt able to transfer to [facility] today with initial education for parts management and propulsion with all extremities with assist to direct and cues for direction. Pt educated for HEP and progressive WC mobility to improve functional mobility. Pt very pleased with mobility this session and new opportunities for moving.  Supine 170/78 (106) Sitting 161/88(109) HR 87-97 SpO2 97% RA    Follow Up Recommendations  SNF;Supervision for mobility/OOB     Equipment Recommendations  Wheelchair (measurements PT)    Recommendations for Other Services       Precautions / Restrictions Precautions Precautions: Fall;Other (comment) Precaution Comments: watch BP- orthostasis Other Brace: R ankle brace and ortho shoe Restrictions Weight Bearing Restrictions: No    Mobility  Bed Mobility Overal bed mobility: Needs Assistance Bed Mobility: Supine to Sit     Supine to sit: Supervision;HOB elevated     General bed mobility comments: HOB 15 degrees with increased time for pt to pivot to sitting on right side    Transfers Overall transfer level: Needs assistance   Transfers: Sit to/from Stand;Stand Pivot Transfers Sit to Stand: Min assist Stand pivot transfers: Min assist       General transfer comment: min assist for safety with cues for hand placement and positioning to pivot from bed>WC>recliner  Ambulation/Gait             General Gait Details: unable due to orthostatic BP   Stairs              Information systems manager mobility: Yes Wheelchair propulsion: Both upper extremities;Both lower extermities Wheelchair parts: Needs assistance Distance: 100' Wheelchair Assistance Details (indicate cue type and reason): pt with education for use of all parts, cues for direction and propulsion with min assist to steer and max assist to back into tight space and for managing leg rests  Modified Rankin (Stroke Patients Only)       Balance Overall balance assessment: Needs assistance;History of Falls Sitting-balance support: No upper extremity supported Sitting balance-Leahy Scale: Fair Sitting balance - Comments: sitting without UE support EOB   Standing balance support: Bilateral upper extremity supported Standing balance-Leahy Scale: Poor Standing balance comment: UE on surface and other supported by therapist for pivot transfers                            Cognition Arousal/Alertness: Awake/alert Behavior During Therapy: WFL for tasks assessed/performed Overall Cognitive Status: Impaired/Different from baseline Area of Impairment: Memory;Problem solving                     Memory: Decreased short-term memory       Problem Solving: Slow processing General Comments: repeats statements and questions. limited by anxiety over falling      Exercises General Exercises - Lower Extremity Long Arc Quad: AROM;Both;Seated;15 reps Hip Flexion/Marching: AROM;Both;Seated;15 reps    General Comments        Pertinent Vitals/Pain Pain Assessment: No/denies pain    Home Living  Prior Function            PT Goals (current goals can now be found in the care plan section) Progress towards PT goals: Progressing toward goals    Frequency    Min 2X/week      PT Plan Current plan remains appropriate    Co-evaluation              AM-PAC PT "6 Clicks" Mobility   Outcome Measure  Help  needed turning from your back to your side while in a flat bed without using bedrails?: A Little Help needed moving from lying on your back to sitting on the side of a flat bed without using bedrails?: A Little Help needed moving to and from a bed to a chair (including a wheelchair)?: A Little Help needed standing up from a chair using your arms (e.g., wheelchair or bedside chair)?: A Little Help needed to walk in hospital room?: Total Help needed climbing 3-5 steps with a railing? : Total 6 Click Score: 14    End of Session   Activity Tolerance: Patient tolerated treatment well Patient left: in chair;with call bell/phone within reach;with chair alarm set Nurse Communication: Mobility status;Precautions PT Visit Diagnosis: Unsteadiness on feet (R26.81);Muscle weakness (generalized) (M62.81);Difficulty in walking, not elsewhere classified (R26.2)     Time: 3009-2330 PT Time Calculation (min) (ACUTE ONLY): 34 min  Charges:  $Therapeutic Exercise: 8-22 mins $Wheel Chair Management: 8-22 mins                     Niomi Valent P, PT Acute Rehabilitation Services Pager: [phone] Office: [place] 04/24/2020, 1:07 PM

## 2020-04-24 NOTE — Progress Notes (Signed)
Patient ID: [name], female   DOB: Jul 11, 1929, 87 y.o.   MRN: [id number] [facility] Progress Note   Assessment/ Plan:   1.  Hyponatremia: Acute on chronic.  Euvolemic and with hypoosmolar hyponatremia.  Previous urine studies reflect inappropriate ADH state in a patient with likely reset osmostat.  Started yesterday on fluid restriction and low-dose furosemide-recommend continuing furosemide for the next 72 hours and rechecking a basic metabolic panel on 8/33 or 3/18 to follow-up on sodium level.  She will need to maintain fluid restriction of <1.2 L/day.  I recommend not restarting RAS blockers and would not discharge on salt tablets. 2.  Orthostatic hypotension: Suspect autonomic dysfunction of unclear etiology; agree with discontinuing antihypertensive therapy with as needed treatment of supine hypertension. 3.  Hypomagnesemia: Likely secondary to poor intake, agree with supplementation via intravenous route. 4.  Protein calorie malnutrition: Recent albumin drop noted current hospitalization-continue nutritional supplementation.  Subjective:   Reports that she is feeling much better today and is looking forward to going back to her skilled nursing facility.   Objective:   BP 116/63 (BP Location: Left Arm)   Pulse 86   Temp 97.9 F (36.6 C) (Oral)   Resp (!) 25   Ht 5\' 3"  (1.6 m)   Wt 76.6 kg   SpO2 97%   BMI 29.91 kg/m   Intake/Output Summary (Last 24 hours) at 04/24/2020 3832 Last data filed at 04/24/2020 0300 Gross per 24 hour  Intake 590 ml  Output 801 ml  Net -211 ml   Weight change: -0.9 kg  Physical Exam: Gen: Comfortably resting in bed, eating a snack CVS: Pulse regular rhythm, normal rate, S1 and S2 normal Resp: Clear to auscultation without any rales/rhonchi Abd: Soft, flat, nontender, bowel sounds normal Ext: No lower extremity edema  Imaging: No results found.  Labs: BMET Recent Labs  Lab 04/18/20 0130 04/19/20 0945 04/20/20 0024  04/21/20 0055 04/22/20 0109 04/23/20 0652 04/24/20 0058  NA 130* 127* 128* 128* 126* 128* 127*  K 4.2 4.1 4.4 4.0 4.1 4.2 4.3  CL 100 97* 99 99 98 96* 97*  CO2 24 23 24 23 22 25 25   GLUCOSE 83 132* 104* 102* 111* 142* 109*  BUN 16 20 21 18 16 17 23   CREATININE 0.91 0.97 0.94 0.88 0.83 0.97 0.97  CALCIUM 8.6* 8.5* 8.5* 8.2* 8.4* 8.6* 8.3*  PHOS  --  2.2* 2.9 3.0 2.8 2.6 3.1   CBC Recent Labs  Lab 04/21/20 0055 04/22/20 0109 04/23/20 0652 04/24/20 0058  WBC 6.4 8.4 6.7 7.1  NEUTROABS 3.6 5.6 4.4 4.2  HGB 10.5* 10.8* 11.9* 10.3*  HCT 32.0* 32.5* 34.9* 31.0*  MCV 85.8 84.9 83.9 84.9  PLT 293 322 336 332   Medications:    . docusate sodium  100 mg Oral Daily  . enoxaparin (LOVENOX) injection  40 mg Subcutaneous Q24H  . furosemide  10 mg Oral Daily  . magnesium oxide  400 mg Oral BID  . melatonin  3 mg Oral QHS  . multivitamin  1 tablet Oral Daily  . pantoprazole  40 mg Oral BID  . polyethylene glycol  17 g Oral Daily  . sodium chloride flush  3 mL Intravenous Q12H  . sodium chloride  1 g Oral BID WC   Elmarie Shiley, MD 04/24/2020, 8:22 AM

## 2020-04-24 NOTE — TOC Transition Note (Signed)
Transition of Care [facility]) - CM/SW Discharge Note   Patient Details  Name: [name] MRN: [id number] Date of Birth: [birth year]  [facility] CM/SW Contact:  Bethann Berkshire, [place] Phone Number: 04/24/2020, 2:46 PM   Clinical Narrative:     Patient will DC to: [place] Anticipated DC date: 04/24/20 Family notified: Bonnita Nasuti Transport by: Corey Harold   Per MD patient ready for DC to [place], patient, patient's family, and facility notified of DC. Discharge Summary and FL2 sent to facility. RN to call report prior to discharge [phone]). DC packet on chart. Ambulance transport requested for patient.   CSW will sign off for now as social work intervention is no longer needed. Please consult [place] again if new needs arise.   Final next level of care: Skilled Nursing Facility Barriers to Discharge: No Barriers Identified   Patient Goals and CMS Choice        Discharge Placement              Patient chooses bed at: [facility] Patient to be transferred to facility by: [facility] Name of family member notified: Daughter Almyra Free Patient and family notified of of transfer: 04/24/20  Discharge Plan and Services                                     Social Determinants of Health (SDOH) Interventions     Readmission Risk Interventions No flowsheet data found.

## 2020-04-25 ENCOUNTER — Encounter: Payer: Self-pay | Admitting: *Deleted

## 2020-04-25 NOTE — Progress Notes (Signed)
Patient ID: [name], female   DOB: 1929/04/21, 87 y.o.   MRN: [id number] Patient enrolled for [employer] to ship a 30 day cardiac event monitor to [facility], [place], [address], [place], [place]  [address]  Attn:  Abbe Amsterdam, Clinical Coordinator. Letter with instructions emailed to [email or web address].

## 2020-04-26 ENCOUNTER — Non-Acute Institutional Stay (SKILLED_NURSING_FACILITY): Payer: Medicare Other | Admitting: Internal Medicine

## 2020-04-26 ENCOUNTER — Encounter: Payer: Self-pay | Admitting: Internal Medicine

## 2020-04-26 DIAGNOSIS — K219 Gastro-esophageal reflux disease without esophagitis: Secondary | ICD-10-CM

## 2020-04-26 DIAGNOSIS — E871 Hypo-osmolality and hyponatremia: Secondary | ICD-10-CM

## 2020-04-26 DIAGNOSIS — I10 Essential (primary) hypertension: Secondary | ICD-10-CM | POA: Diagnosis not present

## 2020-04-26 DIAGNOSIS — R55 Syncope and collapse: Secondary | ICD-10-CM

## 2020-04-26 NOTE — Progress Notes (Signed)
Provider:  Veleta Miners MD Location:    [place] Room Number: 4 Place of Service:  SNF (31)  PCP: Hulan Fess, MD Patient Care Team: Hulan Fess, MD as PCP - General (Family Medicine) Donato Heinz, MD as PCP - Cardiology (Cardiology) Brien Few, MD as Consulting Physician (Obstetrics and Gynecology) Marti Sleigh, MD as Consulting Physician (Gynecology) Nancy Marus, MD as Consulting Physician (Gynecologic Oncology)  Extended Emergency Contact Information Primary Emergency Contact: [facility] Address: [address]          [place], [place] [address] [name] of [place] Phone: [phone] Mobile Phone: [phone] Relation: Daughter  Code Status: Full Code Goals of Care: Advanced Directive information Advanced Directives 04/26/2020  Does Patient Have a Medical Advance Directive? Yes  Type of Advance Directive Living will;Healthcare Power of Attorney  Does patient want to make changes to medical advance directive? No - Patient declined  Copy of [place] in Chart? Yes - validated most recent copy scanned in chart (See row information)  Would patient like information on creating a medical advance directive? -  Pre-existing out of facility DNR order (yellow form or pink MOST form) -      Chief Complaint  Patient presents with  . Readmit To SNF    Readmission to SNF    HPI: Patient is a 87 y.o. female seen today for Readmission to SNF for therapy  Patient has a history of hypertension and hyperlipidemia.  History of GERD, hyponatremia  Was in SNF after admitted in the hospital from 2/25-  3/2 after having syncope due to orthostatic hypotension Patient had another Episode of Syncope in SNF when she was going to the [facility] She also had Mid sternal Chest Pain For her Orthostatic hypotension all her antihypertensive medications were stopped.  She has been told to wear thigh-high TED  hoses. Cardiology was also consulted and they are planning a 30-day monitor placed patient. For hyponatremia she was seen by nephrology.  They recommended Lasix with daily and fluid restriction of 1200 cc for SIADH reset She was also seen by GI for some abnormal CT scan finding.  GI recommended further work-up with EGD and barium swallow which patient refused  Patient seen this morning the daughter was in the room The daughter was upset with the therapist because she thought that they were pushing her much faster.  She also wants to make sure patient has bedside commode and Thigh high Stockings before she gets  Past Medical History:  Diagnosis Date  . Anemia associated with acute blood loss 04/03/2011  . Arthritis    knees   . Ascites   . Blood transfusion    hx of 2011   . Complication of anesthesia    Sodium drops per pt   . Cystitis   . DIARRHEA, ANTIBIOTIC ASSOCIATED 05/31/2009   Qualifier: Diagnosis of  By: Tommy Medal MD, Roderic Scarce    . GERD (gastroesophageal reflux disease)   . H/O hiatal hernia   . Hyperlipidemia   . Hypertension   . Hyponatremia   . Neutropenia with fever ([place]) 05/18/2011  . OSTEOMYELITIS, CHRONIC, LOWER LEG 04/23/2009   Qualifier: Diagnosis of  By: Johnnye Sima MD, Dellis Filbert    . Osteopenia   . OSTEOPOROSIS 04/23/2009   Qualifier: Diagnosis of  By: Johnnye Sima MD, Dellis Filbert    . Ovarian cancer ([place]) 01/25/2011  . Pneumonia    hx of   . Postmenopausal atrophic vaginitis   . PROSTHETIC JOINT COMPLICATION  05/28/2009   Qualifier: Diagnosis of  By: Tommy Medal MD, Roderic Scarce    . Renal disorder    Decreased kidney function  . Staph infection 2010   after knee replacement  . Urinary frequency   . Vulvitis    Past Surgical History:  Procedure Laterality Date  . ABDOMINAL HYSTERECTOMY  04/01/2011   Procedure: HYSTERECTOMY ABDOMINAL;  Surgeon: Alvino Chapel, MD;  Location: [facility];  Service: Gynecology;  Laterality: N/A;  . APPENDECTOMY    . JOINT REPLACEMENT     R  knee in 2008, 5 operations on L knee  . LAPAROTOMY  04/01/2011   Procedure: EXPLORATORY LAPAROTOMY;  Surgeon: Alvino Chapel, MD;  Location: [facility];  Service: Gynecology;  Laterality: N/A;  . OTHER SURGICAL HISTORY     hx of C section 1966  . SALPINGOOPHORECTOMY  04/01/2011   Procedure: SALPINGO OOPHERECTOMY;  Surgeon: Alvino Chapel, MD;  Location: [facility];  Service: Gynecology;  Laterality: Bilateral;  . TONSILLECTOMY      reports that she quit smoking about 68 years ago. She has never used smokeless tobacco. She reports previous alcohol use. She reports that she does not use drugs. Social History   Socioeconomic History  . Marital status: Widowed    Spouse name: Not on file  . Number of children: Not on file  . Years of education: Not on file  . Highest education level: Not on file  Occupational History  . Not on file  Tobacco Use  . Smoking status: Former Smoker    Quit date: 02/11/1952    Years since quitting: 68.2  . Smokeless tobacco: Never Used  Vaping Use  . Vaping Use: Never used  Substance and Sexual Activity  . Alcohol use: Not Currently  . Drug use: Never  . Sexual activity: Never  Other Topics Concern  . Not on file  Social History Narrative  . Not on file   Social Determinants of Health   Financial Resource Strain: Not on file  Food Insecurity: Not on file  Transportation Needs: Not on file  Physical Activity: Not on file  Stress: Not on file  Social Connections: Not on file  Intimate Partner Violence: Not on file    Functional Status Survey:    Family History  Problem Relation Age of Onset  . Cancer Other        Bladder cancer  . Heart attack Brother   . Diabetes Brother     Health Maintenance  Topic Date Due  . COVID-19 Vaccine (4 - Booster for Moderna series) 06/24/2020  . TETANUS/TDAP  10/15/2020  . INFLUENZA VACCINE  Completed  . DEXA SCAN  Completed  . PNA vac Low Risk Adult  Completed  . HPV VACCINES  Aged Out     Allergies  Allergen Reactions  . Morphine And Related Other (See Comments)    Given after knee replacement and code blue occurred  . Penicillins Hives and Itching    syncope  . Nsaids Other (See Comments)    abn kidney test  . Shellfish Allergy Nausea And Vomiting and Other (See Comments)    Pt has shellfish allergy only.  Has had IV contrast x 2 and did fine.    Allergies as of 04/26/2020      Reactions   Morphine And Related Other (See Comments)   Given after knee replacement and code blue occurred   Penicillins Hives, Itching   syncope   Nsaids Other (See Comments)  abn kidney test   Shellfish Allergy Nausea And Vomiting, Other (See Comments)   Pt has shellfish allergy only.  Has had IV contrast x 2 and did fine.      Medication List       Accurate as of April 26, 2020  9:28 AM. If you have any questions, ask your nurse or doctor.        STOP taking these medications   CENTRUM ADULTS PO Stopped by: Virgie Dad, MD     TAKE these medications   acetaminophen 500 MG tablet Commonly known as: TYLENOL Take 2 tablets (1,000 mg total) by mouth 3 (three) times daily.   azelastine 0.1 % nasal spray Commonly known as: ASTELIN Place 1 spray into both nostrils at bedtime as needed for rhinitis or allergies.   calcium carbonate 750 MG chewable tablet Commonly known as: TUMS EX Chew 750 mg by mouth daily as needed for heartburn.   diclofenac Sodium 1 % Gel Commonly known as: VOLTAREN Apply 2 g topically 4 (four) times daily.   docusate sodium 100 MG capsule Commonly known as: COLACE Take 100 mg by mouth at bedtime as needed for mild constipation.   furosemide 20 MG tablet Commonly known as: LASIX Take 0.5 tablets (10 mg total) by mouth daily for 3 days.   magnesium oxide 400 (241.3 Mg) MG tablet Commonly known as: MAG-OX Take 1 tablet (400 mg total) by mouth daily for 14 days.   meclizine 12.5 MG tablet Commonly known as: ANTIVERT Take 1 tablet (12.5  mg total) by mouth 2 (two) times daily as needed for dizziness.   melatonin 3 MG Tabs tablet Take 1 tablet (3 mg total) by mouth at bedtime.   pantoprazole 40 MG tablet Commonly known as: PROTONIX Take 1 tablet (40 mg total) by mouth 2 (two) times daily.   Restasis 0.05 % ophthalmic emulsion Generic drug: cycloSPORINE Place 1 drop into both eyes in the morning and at bedtime.   VITAMIN B COMPLEX PO Take 1 tablet by mouth daily.   Vitamin D 50 MCG (2000 UT) tablet Take 2,000 Units by mouth daily.   zinc oxide 20 % ointment Apply 1 application topically as needed for irritation.       Review of Systems  Review of Systems  Constitutional: Negative for activity change, appetite change, chills, diaphoresis, fatigue and fever.  HENT: Negative for mouth sores, postnasal drip, rhinorrhea, sinus pain and sore throat.   Respiratory: Negative for apnea, cough, chest tightness, shortness of breath and wheezing.   Cardiovascular: Negative for chest pain, palpitations and leg swelling.  Gastrointestinal: Negative for abdominal distention, abdominal pain, constipation, diarrhea, nausea and vomiting.  Genitourinary: Negative for dysuria and frequency.  Musculoskeletal: Negative for arthralgias, joint swelling and myalgias.  Skin: Negative for rash.  Neurological: Negative for dizziness, syncope, weakness, light-headedness and numbness.  Psychiatric/Behavioral: Negative for behavioral problems, confusion and sleep disturbance.     Vitals:   04/26/20 0922  BP: (!) 163/86  Pulse: 62  Resp: 20  Temp: (!) 97 F (36.1 C)  SpO2: 96%  Weight: 140 lb (63.5 kg)  Height: 5\' 3"  (1.6 m)   Body mass index is 24.8 kg/m. Physical Exam  Constitutional: Oriented to person, place, and time. Well-developed and well-nourished.  HENT:  Head: Normocephalic.  Mouth/Throat: Oropharynx is clear and moist.  Eyes: Pupils are equal, round, and reactive to light.  Neck: Neck supple.  Cardiovascular:  Normal rate and normal heart sounds.  No murmur heard.  Pulmonary/Chest: Effort normal and breath sounds normal. No respiratory distress. No wheezes. She has no rales.  Abdominal: Soft. Bowel sounds are normal. No distension. There is no tenderness. There is no rebound.  Musculoskeletal: No edema.  Lymphadenopathy: none Neurological: Alert and oriented to person, place, and time.  Skin: Skin is warm and dry.  Psychiatric: Normal mood and affect. Behavior is normal. Thought content normal.    Labs reviewed: Basic Metabolic Panel: Recent Labs    04/22/20 0109 04/23/20 0652 04/24/20 0058  NA 126* 128* 127*  K 4.1 4.2 4.3  CL 98 96* 97*  CO2 22 25 25   GLUCOSE 111* 142* 109*  BUN 16 17 23   CREATININE 0.83 0.97 0.97  CALCIUM 8.4* 8.6* 8.3*  MG 1.6* 1.6* 1.6*  PHOS 2.8 2.6 3.1   Liver Function Tests: Recent Labs    04/22/20 0109 04/23/20 0652 04/24/20 0058  AST 22 33 23  ALT 16 25 20   ALKPHOS 75 103 77  BILITOT 1.0 0.9 0.7  PROT 6.5 6.9 5.8*  ALBUMIN 2.5* 2.5* 2.2*   Recent Labs    03/02/20 1053  LIPASE 40   No results for input(s): AMMONIA in the last 8760 hours. CBC: Recent Labs    04/22/20 0109 04/23/20 0652 04/24/20 0058  WBC 8.4 6.7 7.1  NEUTROABS 5.6 4.4 4.2  HGB 10.8* 11.9* 10.3*  HCT 32.5* 34.9* 31.0*  MCV 84.9 83.9 84.9  PLT 322 336 332   Cardiac Enzymes: No results for input(s): CKTOTAL, CKMB, CKMBINDEX, TROPONINI in the last 8760 hours. BNP: Invalid input(s): POCBNP No results found for: HGBA1C Lab Results  Component Value Date   TSH 2.357 04/06/2020   No results found for: VITAMINB12 No results found for: FOLATE Lab Results  Component Value Date   IRON 74 05/27/2011   TIBC 299 05/27/2011   FERRITIN 498 (H) 05/27/2011    Imaging and Procedures obtained prior to SNF admission: CT ABDOMEN PELVIS WO CONTRAST  Addendum Date: 04/17/2020   ADDENDUM REPORT: 04/17/2020 16:01 ADDENDUM: Findings should include old healed right ninth and tenth  rib fractures. Electronically Signed   By: Iven Finn M.D.   On: 04/17/2020 16:01   Result Date: 04/17/2020 CLINICAL DATA:  Nonlocalized acute abdominal pain. EXAM: CT ABDOMEN AND PELVIS WITHOUT CONTRAST TECHNIQUE: Multidetector CT imaging of the abdomen and pelvis was performed following the standard protocol without IV contrast. COMPARISON:  CT angiography chest 04/16/2020, CT abdomen pelvis 03/05/2011 FINDINGS: Lower chest: Persistent trace right pleural effusion. Bilateral lower lobe subsegmental atelectasis. At least small volume hiatal hernia with persistent distal esophageal thickening. Medial left base atelectasis (6:60); however, a component of trace inferior pneumomediastinum cannot be excluded (3: 4-11). Hepatobiliary: No focal liver abnormality. The gallbladder is contracted. Punctate calcific density within the gallbladder lumen likely represents a gallstone. No gallbladder wall thickening or pericholecystic fluid. No biliary dilatation. Pancreas: No focal lesion. Normal pancreatic contour. No surrounding inflammatory changes. No main pancreatic ductal dilatation. Spleen: Normal in size without focal abnormality. Adrenals/Urinary Tract: No adrenal nodule bilaterally. Punctate calcific densities within bilateral kidneys. No hydronephrosis and no contour-deforming renal mass. No ureterolithiasis or hydroureter. Fluid fluid level within the urinary bladder likely related to previously administered intravenous contrast for CT angiography chest 04/16/2020. Stomach/Bowel: Stomach is within normal limits. No evidence of bowel wall thickening or dilatation. Diffuse colonic diverticulosis. No pneumatosis. The appendix not definitely identified. Vascular/Lymphatic: Several punctate phleboliths are noted within the pelvis. Focal dilatation of the infrarenal abdominal aorta measuring up to 2.8  cm in diameter with no definite abdominal aorta or iliac aneurysm. Moderate to severe atherosclerotic plaque of the  aorta and its branches. No abdominal, pelvic, or inguinal lymphadenopathy. Reproductive: Status post hysterectomy. No adnexal masses. Other: No intraperitoneal free fluid. No intraperitoneal free gas. No organized fluid collection. Musculoskeletal: Two large consecutive anterior abdominal wall hernias with the likely paraumbilical hernia containing a long segment of the mid to distal transverse colon with an abdominal defect of 5.8 x 4.5 cm. The inferior-most hernia likely a infraumbilical hernia contains several loops of small bowel and demonstrates a abdominal defect of approximately 5.8 x 3.7 cm. Grade 1 anterolisthesis of L4 on L5. Multilevel facet arthropathy. Visualized lower thoracic spine demonstrates multilevel osteophyte formation. No suspicious lytic or blastic osseous lesions. No acute displaced fracture. IMPRESSION: 1. Question trace inferior pneumomediastinum. In the setting of a small hiatal hernia and distal esophageal thickening, a small or contained esophageal perforation cannot be excluded. 2. Two large consecutive anterior abdominal wall hernias, one of which is likely paraumbilical, containing small and large bowel. No definite findings of ischemia or associated bowel obstruction with however limited evaluation due to venous noncontrast study. 3. Colonic diverticulosis with no acute diverticulitis. 4. Nonobstructive punctate nephrolithiasis bilaterally. 5. Aortic Atherosclerosis (ICD10-I70.0). These results were called by telephone at the time of interpretation on 04/17/2020 at 3:45 pm to provider [facility] , who verbally acknowledged these results. Electronically Signed: By: Iven Finn M.D. On: 04/17/2020 15:52   CT Angio Chest PE W and/or Wo Contrast  Result Date: 04/16/2020 CLINICAL DATA:  Chest pain, history of prior fall, initial encounter EXAM: CT ANGIOGRAPHY CHEST WITH CONTRAST TECHNIQUE: Multidetector CT imaging of the chest was performed using the standard protocol during bolus  administration of intravenous contrast. Multiplanar CT image reconstructions and MIPs were obtained to evaluate the vascular anatomy. CONTRAST:  33mL OMNIPAQUE IOHEXOL 350 MG/ML SOLN COMPARISON:  Chest x-ray from earlier in the same day. FINDINGS: Cardiovascular: Thoracic aorta demonstrates atherosclerotic calcifications without aneurysmal dilatation. The degree of aortic enhancement is not sufficient to evaluate for dissection. No cardiac enlargement is seen. Diffuse coronary calcifications are noted. The pulmonary artery shows a normal branching pattern. No filling defect to suggest pulmonary embolism is noted. Mediastinum/Nodes: Thoracic inlet is within normal limits. No sizable hilar or mediastinal adenopathy is noted. Esophageal thickening is noted distally likely related to reflux. Hiatal hernia is seen. Lungs/Pleura: Mild bibasilar atelectatic changes are seen. Small right pleural effusion is noted. No sizable infiltrate is seen. No sizable parenchymal nodules are noted. Upper Abdomen: Visualized upper abdomen is otherwise within normal limits. Musculoskeletal: Degenerative changes of the thoracic spine are seen. No acute rib abnormality is seen. Review of the MIP images confirms the above findings. IMPRESSION: No evidence of pulmonary emboli. Small right pleural effusion and bibasilar atelectatic changes. Hiatal hernia with distal esophageal thickening likely related to reflux. Aortic Atherosclerosis (ICD10-I70.0). Electronically Signed   By: Inez Catalina M.D.   On: 04/16/2020 19:55   DG Chest Portable 1 View  Result Date: 04/16/2020 CLINICAL DATA:  Recent fall, chest pain EXAM: PORTABLE CHEST 1 VIEW COMPARISON:  08/04/2019 FINDINGS: Single frontal view of the chest demonstrates an unremarkable cardiac silhouette. No airspace disease, effusion, or pneumothorax. No acute bony abnormalities. IMPRESSION: 1. No acute intrathoracic process. Electronically Signed   By: Randa Ngo M.D.   On: 04/16/2020 18:49    DG ESOPHAGUS W SINGLE CM (SOL OR THIN BA)  Result Date: 04/17/2020 CLINICAL DATA:  Possible distal esophageal perforation  near the GE junction. EXAM: ESOPHOGRAM/BARIUM SWALLOW TECHNIQUE: Single contrast examination was performed using water-soluble contrast. FLUOROSCOPY TIME:  Fluoroscopy Time:  1.7 minutes Radiation Exposure Index (if provided by the fluoroscopic device): 21.5 mGy Number of Acquired Spot Images: 0 COMPARISON:  CT abdomen pelvis from same day. Esophagram dated May 09, 2016. FINDINGS: Normal esophageal course and contour. No obstruction, stricture, or perforation. Unchanged small hiatal hernia. The procedure was terminated after the patient aspirated a small amount of contrast. IMPRESSION: 1. No evidence of esophageal perforation. 2. Positive for aspiration. Consider modified barium swallow study for further evaluation. Electronically Signed   By: Titus Dubin M.D.   On: 04/17/2020 19:05    Assessment/Plan  Syncope, unspecified syncope type  Cardiology is planning to monitor Orthostatic hypotension still the possibility Patient is going to continue using high high TED hoses when up Discussed with the daughter at that  therapy will work slowly with patient.   All her blood pressure medications had now stopped.  Plan for passive hypertension.  Essential hypertension Off all blood pressure medications Hyponatremia Was seen by renal questionable SIADH. They have recommended Lasix for 3 days and fluid restriction of 1200 cc Repeat BMP in 1 week  Gastroesophageal reflux disease, unspecified whether esophagitis present Was seen by GI.  Patient has refused further work-up Continue Protonix for now   Family/ staff Communication:   Labs/tests ordered:BMP and CBC Total time spent in this patient care encounter was  _45  minutes; greater than 50% of the visit spent counseling patient and staff, reviewing records , Labs and coordinating care for problems addressed at this  encounter.

## 2020-04-28 ENCOUNTER — Encounter (HOSPITAL_COMMUNITY): Payer: Self-pay

## 2020-04-28 ENCOUNTER — Observation Stay (HOSPITAL_COMMUNITY)
Admission: EM | Admit: 2020-04-28 | Discharge: 2020-04-30 | Disposition: A | Discharge status: Home / Self Care | Payer: Medicare Other | Attending: Family Medicine | Admitting: Family Medicine

## 2020-04-28 ENCOUNTER — Other Ambulatory Visit: Payer: Self-pay

## 2020-04-28 DIAGNOSIS — Z20822 Contact with and (suspected) exposure to covid-19: Secondary | ICD-10-CM | POA: Diagnosis not present

## 2020-04-28 DIAGNOSIS — I10 Essential (primary) hypertension: Secondary | ICD-10-CM | POA: Insufficient documentation

## 2020-04-28 DIAGNOSIS — E871 Hypo-osmolality and hyponatremia: Secondary | ICD-10-CM | POA: Insufficient documentation

## 2020-04-28 DIAGNOSIS — R0902 Hypoxemia: Secondary | ICD-10-CM | POA: Diagnosis not present

## 2020-04-28 DIAGNOSIS — I959 Hypotension, unspecified: Secondary | ICD-10-CM | POA: Diagnosis not present

## 2020-04-28 DIAGNOSIS — Z87891 Personal history of nicotine dependence: Secondary | ICD-10-CM | POA: Insufficient documentation

## 2020-04-28 DIAGNOSIS — Z79899 Other long term (current) drug therapy: Secondary | ICD-10-CM | POA: Diagnosis not present

## 2020-04-28 DIAGNOSIS — Z8543 Personal history of malignant neoplasm of ovary: Secondary | ICD-10-CM | POA: Diagnosis not present

## 2020-04-28 DIAGNOSIS — W19XXXA Unspecified fall, initial encounter: Secondary | ICD-10-CM | POA: Diagnosis not present

## 2020-04-28 DIAGNOSIS — R55 Syncope and collapse: Principal | ICD-10-CM | POA: Insufficient documentation

## 2020-04-28 DIAGNOSIS — Z96653 Presence of artificial knee joint, bilateral: Secondary | ICD-10-CM | POA: Diagnosis not present

## 2020-04-28 DIAGNOSIS — Z5731 Occupational exposure to environmental tobacco smoke: Secondary | ICD-10-CM | POA: Insufficient documentation

## 2020-04-28 DIAGNOSIS — R531 Weakness: Secondary | ICD-10-CM | POA: Diagnosis not present

## 2020-04-28 LAB — BASIC METABOLIC PANEL
Anion gap: 11 (ref 5–15)
BUN: 30 mg/dL — ABNORMAL HIGH (ref 8–23)
CO2: 20 mmol/L — ABNORMAL LOW (ref 22–32)
Calcium: 9 mg/dL (ref 8.9–10.3)
Chloride: 97 mmol/L — ABNORMAL LOW (ref 98–111)
Creatinine, Ser: 1.16 mg/dL — ABNORMAL HIGH (ref 0.44–1.00)
GFR, Estimated: 45 mL/min — ABNORMAL LOW (ref >60)
Glucose, Bld: 160 mg/dL — ABNORMAL HIGH (ref 70–99)
Potassium: 5.2 mmol/L — ABNORMAL HIGH (ref 3.5–5.1)
Sodium: 128 mmol/L — ABNORMAL LOW (ref 135–145)

## 2020-04-28 LAB — CBC
HCT: 43.5 % (ref 36.0–46.0)
Hemoglobin: 14.6 g/dL (ref 12.0–15.0)
MCH: 28.6 pg (ref 26.0–34.0)
MCHC: 33.6 g/dL (ref 30.0–36.0)
MCV: 85.1 fL (ref 80.0–100.0)
Platelets: 441 10*3/uL — ABNORMAL HIGH (ref 150–400)
RBC: 5.11 MIL/uL (ref 3.87–5.11)
RDW: 15.1 % (ref 11.5–15.5)
WBC: 12.2 10*3/uL — ABNORMAL HIGH (ref 4.0–10.5)
nRBC: 0 % (ref 0.0–0.2)

## 2020-04-28 LAB — TROPONIN I (HIGH SENSITIVITY)
Troponin I (High Sensitivity): 7 ng/L (ref <18)
Troponin I (High Sensitivity): 6 ng/L (ref <18)

## 2020-04-28 LAB — URINALYSIS, ROUTINE W REFLEX MICROSCOPIC
Bilirubin Urine: NEGATIVE
Glucose, UA: NEGATIVE mg/dL
Hgb urine dipstick: NEGATIVE
Ketones, ur: 5 mg/dL — AB
Nitrite: NEGATIVE
Protein, ur: NEGATIVE mg/dL
Specific Gravity, Urine: 1.017 (ref 1.005–1.030)
pH: 5 (ref 5.0–8.0)

## 2020-04-28 MED ORDER — SODIUM CHLORIDE 0.9% FLUSH
3.0000 mL | Freq: Two times a day (BID) | INTRAVENOUS | Status: DC
  Administered 2020-04-29 (×2): 3 mL via INTRAVENOUS

## 2020-04-28 MED ORDER — VITAMIN D 25 MCG (1000 UNIT) PO TABS
2000.0000 [IU] | ORAL_TABLET | Freq: Every morning | ORAL | Status: DC
  Administered 2020-04-29 – 2020-04-30 (×2): 2000 [IU] via ORAL
  Filled 2020-04-28 (×2): qty 2

## 2020-04-28 MED ORDER — PANTOPRAZOLE SODIUM 40 MG PO TBEC
40.0000 mg | DELAYED_RELEASE_TABLET | Freq: Two times a day (BID) | ORAL | Status: DC
  Administered 2020-04-28 – 2020-04-30 (×4): 40 mg via ORAL
  Filled 2020-04-28 (×4): qty 1

## 2020-04-28 MED ORDER — ENOXAPARIN SODIUM 40 MG/0.4ML ~~LOC~~ SOLN
40.0000 mg | Freq: Every day | SUBCUTANEOUS | Status: DC
  Administered 2020-04-28 – 2020-04-29 (×2): 40 mg via SUBCUTANEOUS
  Filled 2020-04-28 (×2): qty 0.4

## 2020-04-28 MED ORDER — SODIUM CHLORIDE 0.9 % IV BOLUS
500.0000 mL | Freq: Once | INTRAVENOUS | Status: AC
  Administered 2020-04-28: 500 mL via INTRAVENOUS

## 2020-04-28 MED ORDER — ONDANSETRON HCL 4 MG/2ML IJ SOLN: 4.0000 mg | Freq: Four times a day (QID) | INTRAMUSCULAR | Status: DC | PRN

## 2020-04-28 MED ORDER — ONDANSETRON HCL 4 MG PO TABS: 4.0000 mg | ORAL_TABLET | Freq: Four times a day (QID) | ORAL | Status: DC | PRN

## 2020-04-28 MED ORDER — MAGNESIUM OXIDE 400 (241.3 MG) MG PO TABS
400.0000 mg | ORAL_TABLET | Freq: Every day | ORAL | Status: DC
  Administered 2020-04-28 – 2020-04-30 (×3): 400 mg via ORAL
  Filled 2020-04-28 (×3): qty 1

## 2020-04-28 MED ORDER — ACETAMINOPHEN 325 MG PO TABS: 650.0000 mg | ORAL_TABLET | Freq: Four times a day (QID) | ORAL | Status: DC | PRN

## 2020-04-28 MED ORDER — DICLOFENAC SODIUM 1 % EX GEL
2.0000 g | Freq: Four times a day (QID) | CUTANEOUS | Status: DC
  Filled 2020-04-28: qty 100

## 2020-04-28 MED ORDER — ACETAMINOPHEN 650 MG RE SUPP: 650.0000 mg | Freq: Four times a day (QID) | RECTAL | Status: DC | PRN

## 2020-04-28 MED ORDER — CYCLOSPORINE 0.05 % OP EMUL
1.0000 [drp] | Freq: Two times a day (BID) | OPHTHALMIC | Status: DC
  Administered 2020-04-28 – 2020-04-30 (×4): 1 [drp] via OPHTHALMIC
  Filled 2020-04-28 (×5): qty 1

## 2020-04-28 MED ORDER — MELATONIN 3 MG PO TABS
3.0000 mg | ORAL_TABLET | Freq: Every day | ORAL | Status: DC
  Administered 2020-04-28 – 2020-04-29 (×2): 3 mg via ORAL
  Filled 2020-04-28 (×2): qty 1

## 2020-04-28 MED ORDER — DOCUSATE SODIUM 100 MG PO CAPS: 100.0000 mg | ORAL_CAPSULE | Freq: Every day | ORAL | Status: DC | PRN

## 2020-04-28 NOTE — ED Notes (Signed)
purewick placed at request of pt and daughter- brief was wet with urine- brief changed

## 2020-04-28 NOTE — H&P (Signed)
History and Physical    [name] EGB:[id number] DOB: 21-Feb-1929 DOA: 04/28/2020  PCP: Hulan Fess, MD  Patient coming from: SNF  I have personally briefly reviewed patient's old medical records in [place]  Chief Complaint: Syncope  HPI: [name] is a 87 y.o. female with medical history significant of HTN, Ovarian CA in remission, chronic hyponatremia.  Pt recently admitted from 04/06/2020-04/11/2020 for syncopewith fall, suspected orthostatic versus micturition syncope in etiology+/- Ambien effect.TTE 04/06/2020 showed EF 70-75%, G1 DD, mild AR andAS. Her Ambien, Norvasc,and nadolol werediscontinued, lisinopril was continued on discharge. TED hose and advised to increase oral intakewererecommended. She was discharged to SNF.  Pt re-admitted 04/16/20-04/24/20 for recurrent presyncope then syncope while ambulating to bathroom.  Orthostatic hypotension noted.  She was treated with IVF, compression stockings, abdominal binder for orthostatic hypotension.  Cardiology was c/s and was planning outpatient monitor after admit.  Hospitalization c/b abdominal imaging concerning for esophageal perforation, esophagram reassuring.  Hospitalization also c/b hyponatremia, likely 2/2 reset osmostat/SIADH.  Discharged with 3 days of lasix and ongoing 1.2L fluid restriction.  Today pt feeling generally weak.  EMS called, BP low 70s to 80s.  Pt had witnessed 3 min syncopal episode in ambulance.  This time pt was sitting during syncopal episode (not at all associated with position change).  Reported what looked like "peaked T waves" during the syncopal episode; however, no rhythm strip is available here in ED and this abnormality not seen on EKG here in ED.  No fevers, chills, N/V.   ED Course: No orthostatic hypotension per EDP today.  Sodium of 128 nearly unchanged from discharge x4 days ago (127 on discharge).  EKG with mild S.Tach.  BUN 30, creat 1.16 slightly up from  baseline.  Given 500cc NS bolus in ED.  Hospitalist asked to re-admit.   Review of Systems: As per HPI, otherwise all review of systems negative.  Past Medical History:  Diagnosis Date  . Anemia associated with acute blood loss 04/03/2011  . Arthritis    knees   . Ascites   . Blood transfusion    hx of 2011   . Complication of anesthesia    Sodium drops per pt   . Cystitis   . DIARRHEA, ANTIBIOTIC ASSOCIATED 05/31/2009   Qualifier: Diagnosis of  By: Tommy Medal MD, Roderic Scarce    . GERD (gastroesophageal reflux disease)   . H/O hiatal hernia   . Hyperlipidemia   . Hypertension   . Hyponatremia   . Neutropenia with fever ([place]) 05/18/2011  . OSTEOMYELITIS, CHRONIC, LOWER LEG 04/23/2009   Qualifier: Diagnosis of  By: Johnnye Sima MD, Dellis Filbert    . Osteopenia   . OSTEOPOROSIS 04/23/2009   Qualifier: Diagnosis of  By: Johnnye Sima MD, Dellis Filbert    . Ovarian cancer ([place]) 01/25/2011  . Pneumonia    hx of   . Postmenopausal atrophic vaginitis   . PROSTHETIC JOINT COMPLICATION 3/71/0626   Qualifier: Diagnosis of  By: Tommy Medal MD, Roderic Scarce    . Renal disorder    Decreased kidney function  . Staph infection 2010   after knee replacement  . Urinary frequency   . Vulvitis     Past Surgical History:  Procedure Laterality Date  . ABDOMINAL HYSTERECTOMY  04/01/2011   Procedure: HYSTERECTOMY ABDOMINAL;  Surgeon: Alvino Chapel, MD;  Location: [facility];  Service: Gynecology;  Laterality: N/A;  . APPENDECTOMY    . JOINT REPLACEMENT     R knee in 2008, 5 operations on  L knee  . LAPAROTOMY  04/01/2011   Procedure: EXPLORATORY LAPAROTOMY;  Surgeon: Alvino Chapel, MD;  Location: [facility];  Service: Gynecology;  Laterality: N/A;  . OTHER SURGICAL HISTORY     hx of C section 1966  . SALPINGOOPHORECTOMY  04/01/2011   Procedure: SALPINGO OOPHERECTOMY;  Surgeon: Alvino Chapel, MD;  Location: [facility];  Service: Gynecology;  Laterality: Bilateral;  . TONSILLECTOMY       reports that she  quit smoking about 68 years ago. She has never used smokeless tobacco. She reports previous alcohol use. She reports that she does not use drugs.  Allergies  Allergen Reactions  . Morphine And Related Anaphylaxis and Other (See Comments)    Given after knee replacement and code blue occurred  . Penicillins Hives, Itching and Other (See Comments)    Syncope, also  . Nsaids Other (See Comments)    Because of an abnormal kidney test result  . Shellfish Allergy Nausea And Vomiting and Other (See Comments)    Pt has shellfish allergy only.  Has had IV contrast x 2 and did fine.    Family History  Problem Relation Age of Onset  . Cancer Other        Bladder cancer  . Heart attack Brother   . Diabetes Brother      Prior to Admission medications   Medication Sig Start Date End Date Taking? Authorizing Provider  acetaminophen (TYLENOL) 500 MG tablet Take 2 tablets (1,000 mg total) by mouth 3 (three) times daily. 04/11/20  Yes Vann, Jessica U, DO  azelastine (ASTELIN) 0.1 % nasal spray Place 1 spray into both nostrils daily as needed for rhinitis or allergies. 12/06/15  Yes [provider]  B Complex-C-Folic Acid (B-COMPLEX/FOLIC ACID/VITAMIN C PO) Take 1 tablet by mouth daily with breakfast.   Yes [provider]  calcium carbonate (TUMS EX) 750 MG chewable tablet Chew 750 mg by mouth daily as needed for heartburn.   Yes [provider]  Cholecalciferol (VITAMIN D3) 50 MCG (2000 UT) TABS Take 2,000 Units by mouth in the morning.   Yes [provider]  diclofenac Sodium (VOLTAREN) 1 % GEL Apply 2 g topically 4 (four) times daily. Patient taking differently: Apply 2 g topically See admin instructions. Apply 2 grams to the right ankle four times a day 04/11/20  Yes Vann, Jessica U, DO  docusate sodium (COLACE) 100 MG capsule Take 100 mg by mouth daily as needed for mild constipation.   Yes [provider]  magnesium oxide (MAG-OX) 400 (241.3 Mg) MG tablet  Take 1 tablet (400 mg total) by mouth daily for 14 days. 04/24/20 05/08/20 Yes Elodia Florence., MD  meclizine (ANTIVERT) 12.5 MG tablet Take 1 tablet (12.5 mg total) by mouth 2 (two) times daily as needed for dizziness. 04/11/20  Yes Vann, Jessica U, DO  melatonin 3 MG TABS tablet Take 1 tablet (3 mg total) by mouth at bedtime. 04/11/20  Yes Vann, Jessica U, DO  pantoprazole (PROTONIX) 40 MG tablet Take 1 tablet (40 mg total) by mouth 2 (two) times daily. 04/24/20 05/24/20 Yes Elodia Florence., MD  RESTASIS 0.05 % ophthalmic emulsion Place 1 drop into both eyes in the morning and at bedtime. 09/02/19  Yes [provider]  zinc oxide 20 % ointment Apply 1 application topically as needed (for redness- to be applied to buttocks/peri area and after every incontinent episode).   Yes [provider]    Physical  Exam: Vitals:   04/28/20 2000 04/28/20 2030 04/28/20 2115 04/28/20 2126  BP: (!) 106/57 109/62 110/63 111/62  Pulse: 99 99 (!) 102 100  Resp: 14 19 (!) 23 20  Temp:    98.2 F (36.8 C)  TempSrc:    Oral  SpO2: 96% 96% 96% 97%    Constitutional: NAD, calm, comfortable Eyes: PERRL, lids and conjunctivae normal ENMT: Mucous membranes are moist. Posterior pharynx clear of any exudate or lesions.Normal dentition.  Neck: normal, supple, no masses, no thyromegaly Respiratory: clear to auscultation bilaterally, no wheezing, no crackles. Normal respiratory effort. No accessory muscle use.  Cardiovascular: Regular rate and rhythm, no murmurs / rubs / gallops. No extremity edema. 2+ pedal pulses. No carotid bruits.  Abdomen: no tenderness, no masses palpated. No hepatosplenomegaly. Bowel sounds positive.  Musculoskeletal: no clubbing / cyanosis. No joint deformity upper and lower extremities. Good ROM, no contractures. Normal muscle tone.  Skin: no rashes, lesions, ulcers. No induration Neurologic: CN 2-12 grossly intact. Sensation intact, DTR normal. Strength 5/5 in all 4.   Psychiatric: Normal judgment and insight. Alert and oriented x 3. Normal mood.    Labs on Admission: I have personally reviewed following labs and imaging studies  CBC: Recent Labs  Lab 04/22/20 0109 04/23/20 0652 04/24/20 0058 04/28/20 1641  WBC 8.4 6.7 7.1 12.2*  NEUTROABS 5.6 4.4 4.2  --   HGB 10.8* 11.9* 10.3* 14.6  HCT 32.5* 34.9* 31.0* 43.5  MCV 84.9 83.9 84.9 85.1  PLT 322 336 332 431*   Basic Metabolic Panel: Recent Labs  Lab 04/22/20 0109 04/23/20 0652 04/24/20 0058 04/28/20 1641  NA 126* 128* 127* 128*  K 4.1 4.2 4.3 5.2*  CL 98 96* 97* 97*  CO2 22 25 25  20*  GLUCOSE 111* 142* 109* 160*  BUN 16 17 23  30*  CREATININE 0.83 0.97 0.97 1.16*  CALCIUM 8.4* 8.6* 8.3* 9.0  MG 1.6* 1.6* 1.6*  --   PHOS 2.8 2.6 3.1  --    GFR: Estimated Creatinine Clearance: 30.1 mL/min (A) (by C-G formula based on SCr of 1.16 mg/dL (H)). Liver Function Tests: Recent Labs  Lab 04/22/20 0109 04/23/20 0652 04/24/20 0058  AST 22 33 23  ALT 16 25 20   ALKPHOS 75 103 77  BILITOT 1.0 0.9 0.7  PROT 6.5 6.9 5.8*  ALBUMIN 2.5* 2.5* 2.2*   No results for input(s): LIPASE, AMYLASE in the last 168 hours. No results for input(s): AMMONIA in the last 168 hours. Coagulation Profile: No results for input(s): INR, PROTIME in the last 168 hours. Cardiac Enzymes: No results for input(s): CKTOTAL, CKMB, CKMBINDEX, TROPONINI in the last 168 hours. BNP (last 3 results) No results for input(s): PROBNP in the last 8760 hours. HbA1C: No results for input(s): HGBA1C in the last 72 hours. CBG: Recent Labs  Lab 04/23/20 1144 04/23/20 1705 04/24/20 0050 04/24/20 0532 04/24/20 1043  GLUCAP 191* 130* 105* 96 124*   Lipid Profile: No results for input(s): CHOL, HDL, LDLCALC, TRIG, CHOLHDL, LDLDIRECT in the last 72 hours. Thyroid Function Tests: No results for input(s): TSH, T4TOTAL, FREET4, T3FREE, THYROIDAB in the last 72 hours. Anemia Panel: No results for input(s): VITAMINB12, FOLATE,  FERRITIN, TIBC, IRON, RETICCTPCT in the last 72 hours. Urine analysis:    Component Value Date/Time   COLORURINE AMBER (A) 04/28/2020 2057   APPEARANCEUR CLOUDY (A) 04/28/2020 2057   LABSPEC 1.017 04/28/2020 2057   LABSPEC 1.010 04/21/2011 1324   PHURINE 5.0 04/28/2020 2057   GLUCOSEU NEGATIVE  04/28/2020 2057   HGBUR NEGATIVE 04/28/2020 2057   BILIRUBINUR NEGATIVE 04/28/2020 2057   BILIRUBINUR Negative 04/21/2011 1324   KETONESUR 5 (A) 04/28/2020 2057   PROTEINUR NEGATIVE 04/28/2020 2057   UROBILINOGEN 0.2 05/18/2011 2014   NITRITE NEGATIVE 04/28/2020 2057   LEUKOCYTESUR TRACE (A) 04/28/2020 2057   LEUKOCYTESUR Moderate 04/21/2011 1324    Radiological Exams on Admission: No results found.  EKG: Independently reviewed.  Assessment/Plan Principal Problem:   Syncope Active Problems:   Essential hypertension   Hyponatremia    1. Syncope - 1. Syncope pathway 2. Tele monitor 3. Repeat BMP in AM 4. No orthostasis at this time 5. Likely needs cards / nephro reconsults in AM 2. Hyponatremia - 1. Due to SIADH and reset osmostat 2. Persists despite 3 days of lasix and 1.2L fluid restriction at home 3. Will continue fluid restriction for now 4. dont want to give any lasix as she looks slightly pre-renal today on labs 5. Reconsult nephrology in AM 3. H/o HTN - 1. Pt off of all HTN meds at this point.  DVT prophylaxis: Lovenox Code Status: Full Family Communication: Daughter at bedside Disposition Plan: SNF after admit Consults called: None Admission status: Place in 51     Myeisha Kruser, [place] [facility]  How to contact the [facility] Attending or Consulting provider [place] or covering provider during after hours [place], for this patient?  1. Check the care team in [facility] and look for a) attending/consulting [facility] provider listed and b) the [facility] team listed 2. Log into [email or web address]  Amion Physician Scheduling and messaging for groups and whole hospitals  On call and  physician scheduling software for group practices, residents, hospitalists and other medical providers for call, clinic, rotation and shift schedules. On[employer] is a hospital-wide system for scheduling doctors and paging doctors on call. EasyPlot is for scientific plotting and data analysis.  [email or web address]  and use [place]'s universal password to access. If you do not have the password, please contact the hospital operator.  3. Locate the [facility] provider you are looking for under [facility] and page to a number that you can be directly reached. 4. If you still have difficulty reaching the provider, please page the [facility] (Director on Call) for the Hospitalists listed on amion for assistance.  04/28/2020, 10:21 PM

## 2020-04-28 NOTE — ED Provider Notes (Signed)
[facility] Provider Note   CSN: [id number] Arrival date & time: 04/28/20  1451     History Chief Complaint  Patient presents with  . Syncope  . Weakness    [name] is a 87 y.o. female.  HPI   This patient is an 87 year old female, she was admitted to the hospital recently with hyponatremia, she was admitted on 7 March and discharged on the 15th.  Review of the medical record including the history and physical and discharge summary from the admitting team shows that the patient did have a syncopal episode, she was hyponatremic and had an acute kidney injury.  [facility] that she had had these events.  She was noted to be orthostatic persistently throughout the hospitalization, she was discharged with Lasix for 3 days and fluid restriction.  Prior to that admission she was also admitted on February 25 through March 2 after having a syncopal event, it was thought again to be orthostatic versus micturition syncope.  At that time she had a transthoracic echocardiogram showing an ejection fraction of 70 to 75%.  There was grade 1 diastolic dysfunction and mild aortic regurgitation and aortic stenosis.  She was on Ambien Norvasc and nadolol which were all discontinued, lisinopril was continued on discharge.  After her last admission to the hospital it was recommended by cardiology that she have a 30-day monitor post discharge, continue compression stockings and follow orthostatics.  She was supposed to wear an abdominal binder.  Sodium level was around 126 at its lowest, improved by discharge, but stayed persistently low between 127 and 128.  Renal function was normal at discharge, other electrolytes unremarkable.  Report from the patient today was that she was feeling generally weak, she had been transported by paramedics for further evaluation due to history of hyponatremia but also  orthostasis.  Blood pressure was noted to be low in the 70s to 80s and the patient had a witnessed 3-minute long syncopal episode on the ambulance witnessed by paramedics.  She was totally unresponsive during that episode.  Past Medical History:  Diagnosis Date  . Anemia associated with acute blood loss 04/03/2011  . Arthritis    knees   . Ascites   . Blood transfusion    hx of 2011   . Complication of anesthesia    Sodium drops per pt   . Cystitis   . DIARRHEA, ANTIBIOTIC ASSOCIATED 05/31/2009   Qualifier: Diagnosis of  By: Tommy Medal MD, Roderic Scarce    . GERD (gastroesophageal reflux disease)   . H/O hiatal hernia   . Hyperlipidemia   . Hypertension   . Hyponatremia   . Neutropenia with fever ([place]) 05/18/2011  . OSTEOMYELITIS, CHRONIC, LOWER LEG 04/23/2009   Qualifier: Diagnosis of  By: Johnnye Sima MD, Dellis Filbert    . Osteopenia   . OSTEOPOROSIS 04/23/2009   Qualifier: Diagnosis of  By: Johnnye Sima MD, Dellis Filbert    . Ovarian cancer ([place]) 01/25/2011  . Pneumonia    hx of   . Postmenopausal atrophic vaginitis   . PROSTHETIC JOINT COMPLICATION 0/24/0973   Qualifier: Diagnosis of  By: Tommy Medal MD, Roderic Scarce    . Renal disorder    Decreased kidney function  . Staph infection 2010   after knee replacement  . Urinary frequency   . Vulvitis     Patient Active Problem List   Diagnosis Date Noted  . Syncope 04/16/2020  . AKI (  acute kidney injury) ([place]) 04/16/2020  . Orthostatic hypotension 04/07/2020  . Syncope and collapse 04/06/2020  . Fall at home, initial encounter 04/06/2020  . Acute right ankle pain 04/06/2020  . Acute lower UTI 10/07/2019  . Diarrhea 10/07/2019  . Acute cystitis without hematuria   . Muscle spasm of right lower extremity 08/05/2019  . Hyponatremia 08/05/2019  . Insomnia 08/05/2019  . Slow transit constipation 08/05/2019  . Atherosclerosis of abdominal aorta ([place]) 10/02/2015  . Obesity (BMI 30-39.9) 10/31/2014  . Ventral incisional hernia 10/31/2014  . Primary  peritoneal carcinomatosis ([place]) 10/31/2014  . Incisional hernia, periumbilical, without obstruction or gangrene 11/03/2012  . Anemia associated with acute blood loss 04/03/2011  . Ovarian cancer ([place]) 01/25/2011  . HYPERLIPIDEMIA 04/23/2009  . Essential hypertension 04/23/2009  . GERD 04/23/2009  . Osteoarthritis 04/23/2009  . Disorder of bone and cartilage 04/23/2009    Past Surgical History:  Procedure Laterality Date  . ABDOMINAL HYSTERECTOMY  04/01/2011   Procedure: HYSTERECTOMY ABDOMINAL;  Surgeon: Alvino Chapel, MD;  Location: [facility];  Service: Gynecology;  Laterality: N/A;  . APPENDECTOMY    . JOINT REPLACEMENT     R knee in 2008, 5 operations on L knee  . LAPAROTOMY  04/01/2011   Procedure: EXPLORATORY LAPAROTOMY;  Surgeon: Alvino Chapel, MD;  Location: [facility];  Service: Gynecology;  Laterality: N/A;  . OTHER SURGICAL HISTORY     hx of C section 1966  . SALPINGOOPHORECTOMY  04/01/2011   Procedure: SALPINGO OOPHERECTOMY;  Surgeon: Alvino Chapel, MD;  Location: [facility];  Service: Gynecology;  Laterality: Bilateral;  . TONSILLECTOMY       OB History    Gravida      Para      Term      Preterm      AB      Living  3     SAB      IAB      Ectopic      Multiple      Live Births              Family History  Problem Relation Age of Onset  . Cancer Other        Bladder cancer  . Heart attack Brother   . Diabetes Brother     Social History   Tobacco Use  . Smoking status: Former Smoker    Quit date: 02/11/1952    Years since quitting: 68.2  . Smokeless tobacco: Never Used  Vaping Use  . Vaping Use: Never used  Substance Use Topics  . Alcohol use: Not Currently  . Drug use: Never    Home Medications Prior to Admission medications   Medication Sig Start Date End Date Taking? Authorizing Provider  acetaminophen (TYLENOL) 500 MG tablet Take 2 tablets (1,000 mg total) by mouth 3 (three) times daily. 04/11/20  Yes Vann,  Jessica U, DO  azelastine (ASTELIN) 0.1 % nasal spray Place 1 spray into both nostrils daily as needed for rhinitis or allergies. 12/06/15  Yes [provider]  B Complex-C-Folic Acid (B-COMPLEX/FOLIC ACID/VITAMIN C PO) Take 1 tablet by mouth daily with breakfast.   Yes [provider]  calcium carbonate (TUMS EX) 750 MG chewable tablet Chew 750 mg by mouth daily as needed for heartburn.   Yes [provider]  Cholecalciferol (VITAMIN D3) 50 MCG (2000 UT) TABS Take 2,000 Units by mouth in the morning.   Yes [provider]  diclofenac Sodium (VOLTAREN)  1 % GEL Apply 2 g topically 4 (four) times daily. Patient taking differently: Apply 2 g topically See admin instructions. Apply 2 grams to the right ankle four times a day 04/11/20  Yes Vann, Jessica U, DO  docusate sodium (COLACE) 100 MG capsule Take 100 mg by mouth daily as needed for mild constipation.   Yes [provider]  magnesium oxide (MAG-OX) 400 (241.3 Mg) MG tablet Take 1 tablet (400 mg total) by mouth daily for 14 days. 04/24/20 05/08/20 Yes Elodia Florence., MD  meclizine (ANTIVERT) 12.5 MG tablet Take 1 tablet (12.5 mg total) by mouth 2 (two) times daily as needed for dizziness. 04/11/20  Yes Vann, Jessica U, DO  melatonin 3 MG TABS tablet Take 1 tablet (3 mg total) by mouth at bedtime. 04/11/20  Yes Vann, Jessica U, DO  pantoprazole (PROTONIX) 40 MG tablet Take 1 tablet (40 mg total) by mouth 2 (two) times daily. 04/24/20 05/24/20 Yes Elodia Florence., MD  RESTASIS 0.05 % ophthalmic emulsion Place 1 drop into both eyes in the morning and at bedtime. 09/02/19  Yes [provider]  zinc oxide 20 % ointment Apply 1 application topically as needed (for redness- to be applied to buttocks/peri area and after every incontinent episode).   Yes [provider]    Allergies    Morphine and related, Penicillins, Nsaids, and Shellfish allergy  Review of Systems   Review of Systems   All other systems reviewed and are negative.   Physical Exam Updated Vital Signs BP (!) 129/59 (BP Location: Left Arm)   Pulse 92   Temp 97.7 F (36.5 C) (Oral)   Resp 18   Wt 63.8 kg   SpO2 95%   BMI 24.92 kg/m   Physical Exam Vitals and nursing note reviewed.  Constitutional:      General: She is not in acute distress.    Appearance: She is well-developed.  HENT:     Head: Normocephalic and atraumatic.     Nose: Nose normal. No congestion or rhinorrhea.     Mouth/Throat:     Mouth: Mucous membranes are moist.     Pharynx: No oropharyngeal exudate.  Eyes:     General: No scleral icterus.       Right eye: No discharge.        Left eye: No discharge.     Conjunctiva/sclera: Conjunctivae normal.     Pupils: Pupils are equal, round, and reactive to light.  Neck:     Thyroid: No thyromegaly.     Vascular: No JVD.  Cardiovascular:     Rate and Rhythm: Normal rate and regular rhythm.     Heart sounds: Murmur ( slight systolic murmur) heard.  No friction rub. No gallop.      Comments: Weak pulses at radial arteries Pulmonary:     Effort: Pulmonary effort is normal. No respiratory distress.     Breath sounds: Normal breath sounds. No wheezing or rales.  Abdominal:     General: Bowel sounds are normal. There is no distension.     Palpations: Abdomen is soft. There is no mass.     Tenderness: There is no abdominal tenderness.  Musculoskeletal:        General: No tenderness. Normal range of motion.     Cervical back: Normal range of motion and neck supple.     Right lower leg: No edema.     Left lower leg: No edema.  Lymphadenopathy:  Cervical: No cervical adenopathy.  Skin:    General: Skin is warm and dry.     Findings: No erythema or rash.  Neurological:     General: No focal deficit present.     Mental Status: She is alert.     Coordination: Coordination normal.     Comments: Awake and oriented to person and place and events.  Psychiatric:        Behavior:  Behavior normal.     ED Results / Procedures / Treatments   Labs (all labs ordered are listed, but only abnormal results are displayed) Labs Reviewed  BASIC METABOLIC PANEL - Abnormal; Notable for the following components:      Result Value   Sodium 128 (*)    Potassium 5.2 (*)    Chloride 97 (*)    CO2 20 (*)    Glucose, Bld 160 (*)    BUN 30 (*)    Creatinine, Ser 1.16 (*)    GFR, Estimated 45 (*)    All other components within normal limits  CBC - Abnormal; Notable for the following components:   WBC 12.2 (*)    Platelets 441 (*)    All other components within normal limits  URINALYSIS, ROUTINE W REFLEX MICROSCOPIC - Abnormal; Notable for the following components:   Color, Urine AMBER (*)    APPearance CLOUDY (*)    Ketones, ur 5 (*)    Leukocytes,Ua TRACE (*)    Bacteria, UA RARE (*)    All other components within normal limits  CBC - Abnormal; Notable for the following components:   HCT 34.9 (*)    All other components within normal limits  BASIC METABOLIC PANEL - Abnormal; Notable for the following components:   Sodium 127 (*)    Glucose, Bld 119 (*)    BUN 33 (*)    Creatinine, Ser 1.37 (*)    GFR, Estimated 37 (*)    All other components within normal limits  GLUCOSE, CAPILLARY - Abnormal; Notable for the following components:   Glucose-Capillary 114 (*)    All other components within normal limits  SARS CORONAVIRUS 2 (TAT 6-24 HRS)  TROPONIN I (HIGH SENSITIVITY)  TROPONIN I (HIGH SENSITIVITY)    EKG EKG Interpretation  Date/Time:  Saturday April 28 2020 15:02:54 EDT Ventricular Rate:  107 PR Interval:    QRS Duration: 76 QT Interval:  335 QTC Calculation: 447 R Axis:   -17 Text Interpretation: Sinus tachycardia Inferior infarct, old Since last tracing q waves still  present inferiorly no other sig changes Confirmed by Noemi Chapel [id number]) on 04/28/2020 3:21:10 PM   Radiology No results found.  Procedures Procedures   Medications Ordered in  ED Medications  sodium chloride flush (NS) 0.9 % injection 3 mL (3 mLs Intravenous Given 04/29/20 0636)  enoxaparin (LOVENOX) injection 40 mg (40 mg Subcutaneous Given 04/28/20 2240)  acetaminophen (TYLENOL) tablet 650 mg (has no administration in time range)    Or  acetaminophen (TYLENOL) suppository 650 mg (has no administration in time range)  ondansetron (ZOFRAN) tablet 4 mg (has no administration in time range)    Or  ondansetron (ZOFRAN) injection 4 mg (has no administration in time range)  cholecalciferol (VITAMIN D3) tablet 2,000 Units (2,000 Units Oral Given 04/29/20 0629)  cycloSPORINE (RESTASIS) 0.05 % ophthalmic emulsion 1 drop (1 drop Both Eyes Given 04/28/20 2243)  pantoprazole (PROTONIX) EC tablet 40 mg (40 mg Oral Given 04/28/20 2239)  magnesium oxide (MAG-OX) tablet 400 mg (400 mg  Oral Given 04/28/20 2239)  docusate sodium (COLACE) capsule 100 mg (has no administration in time range)  melatonin tablet 3 mg (3 mg Oral Given 04/28/20 2239)  diclofenac Sodium (VOLTAREN) 1 % topical gel 2 g (has no administration in time range)  sodium chloride tablet 1 g (has no administration in time range)  sodium chloride 0.9 % bolus 500 mL (0 mLs Intravenous Stopped 04/28/20 1737)    ED Course  I have reviewed the triage vital signs and the nursing notes.  Pertinent labs & imaging results that were available during my care of the patient were reviewed by me and considered in my medical decision making (see chart for details).    MDM Rules/Calculators/A&P                          The patient is hypotensive, blood pressure currently is around 80 systolic, her heart rate is around 100, when I sit her up her blood pressure does not drop, she remains awake and alert.  She has had a prolonged syncopal event while she was on the stretcher, this was not positional, there was evidently some peaked T waves during this time according to the paramedics, I do not have evidence of this on prehospital  cardiac tracings.  In the EKG on arrival the patient does have some Q waves inferiorly which are not new, there is no other signs of arrhythmia, subtle ST abnormalities in that same inferior leads but nothing significant from prior EKGs.  Cardiac monitoring, labs, urinalysis, IV fluids.  D/w admitting team - has mild hyponatremiea - ongoing syncope pre hospital - which is overall unexplained though thought to be related to orthostasis -s hypotension has improved but answers are not clear - will admit  I did discuss expectations with family - that there may not be answers and this may be progression of chronic illness   Final Clinical Impression(s) / ED Diagnoses Final diagnoses:  None    Rx / DC Orders ED Discharge Orders    None       Noemi Chapel, MD 04/29/20 1023

## 2020-04-28 NOTE — ED Notes (Signed)
Pt was given a tukey sandwich and 338ml of PO fluids.

## 2020-04-28 NOTE — ED Triage Notes (Signed)
Pt BIB [facility] from [place] d/t pt c/o weakness, while in route to ED EMS reports pt had a syncopal episode that lasted approx. 3 minutes. NSR on the monitor with peaked Twaves, 132/84, 100 bpm, 96% on RA, CBG 164.

## 2020-04-28 NOTE — ED Notes (Signed)
Pt stated that she feels wet. Pt has purewick in place. Checked brief and pad. Pt is clean and dry at this time.

## 2020-04-29 DIAGNOSIS — R55 Syncope and collapse: Secondary | ICD-10-CM | POA: Diagnosis not present

## 2020-04-29 DIAGNOSIS — I10 Essential (primary) hypertension: Secondary | ICD-10-CM | POA: Diagnosis not present

## 2020-04-29 DIAGNOSIS — N179 Acute kidney failure, unspecified: Secondary | ICD-10-CM | POA: Diagnosis not present

## 2020-04-29 DIAGNOSIS — E871 Hypo-osmolality and hyponatremia: Secondary | ICD-10-CM | POA: Diagnosis not present

## 2020-04-29 DIAGNOSIS — I951 Orthostatic hypotension: Secondary | ICD-10-CM | POA: Diagnosis not present

## 2020-04-29 LAB — CBC
HCT: 34.9 % — ABNORMAL LOW (ref 36.0–46.0)
Hemoglobin: 12 g/dL (ref 12.0–15.0)
MCH: 28.7 pg (ref 26.0–34.0)
MCHC: 34.4 g/dL (ref 30.0–36.0)
MCV: 83.5 fL (ref 80.0–100.0)
Platelets: 348 10*3/uL (ref 150–400)
RBC: 4.18 MIL/uL (ref 3.87–5.11)
RDW: 15.1 % (ref 11.5–15.5)
WBC: 10.2 10*3/uL (ref 4.0–10.5)
nRBC: 0 % (ref 0.0–0.2)

## 2020-04-29 LAB — BASIC METABOLIC PANEL
Anion gap: 7 (ref 5–15)
BUN: 33 mg/dL — ABNORMAL HIGH (ref 8–23)
CO2: 22 mmol/L (ref 22–32)
Calcium: 8.9 mg/dL (ref 8.9–10.3)
Chloride: 98 mmol/L (ref 98–111)
Creatinine, Ser: 1.37 mg/dL — ABNORMAL HIGH (ref 0.44–1.00)
GFR, Estimated: 37 mL/min — ABNORMAL LOW (ref >60)
Glucose, Bld: 119 mg/dL — ABNORMAL HIGH (ref 70–99)
Potassium: 4.5 mmol/L (ref 3.5–5.1)
Sodium: 127 mmol/L — ABNORMAL LOW (ref 135–145)

## 2020-04-29 LAB — OSMOLALITY, URINE: Osmolality, Ur: 670 mOsm/kg (ref 300–900)

## 2020-04-29 LAB — SARS CORONAVIRUS 2 (TAT 6-24 HRS): SARS Coronavirus 2: NEGATIVE

## 2020-04-29 LAB — GLUCOSE, CAPILLARY: Glucose-Capillary: 114 mg/dL — ABNORMAL HIGH (ref 70–99)

## 2020-04-29 LAB — SODIUM, URINE, RANDOM: Sodium, Ur: 43 mmol/L

## 2020-04-29 MED ORDER — SODIUM CHLORIDE 1 G PO TABS
1.0000 g | ORAL_TABLET | Freq: Three times a day (TID) | ORAL | Status: DC
  Administered 2020-04-29 – 2020-04-30 (×3): 1 g via ORAL
  Filled 2020-04-29 (×5): qty 1

## 2020-04-29 NOTE — Progress Notes (Signed)
PROGRESS NOTE  [name]  VZD:[id number] DOB: 03/27/1929 DOA: 04/28/2020 PCP: Hulan Fess, MD   Brief Narrative: [name] is an 87 y.o. female with a history of ovarian CA s/p TAH/BSO Feb 2013 in remission, chronic hyponatremia, GERD with hiatal hernia, and HTN who returned to the ED on 04/28/2020 with syncope.   - Seen in ED 03/02/2020 with presyncope  - Admission 04/06/2020 - 04/11/2020 for syncopewith fall from commode with facial trauma, suspected orthostatic versus micturition syncope+/- ambien effect.TTE 2/25: LVEF 70- 75%, G1DD, mild AR andAS. Discharged to SNF with TED hose, encouraging PO intake, and holding ambien, norvasc,and nadolol. Lisinopril continued.   - Readmission 04/16/20 - 04/24/20 syncope while ambulating to bathroom. Orthostatic hypotension noted. She was treated with IVF, compression stockings, abdominal binder for orthostatic hypotension. Cardiology consulted, though no dysrhythmias noted, outpatient cardiac monitor arranged.Hospitalization complicated by abdominal imaging concerning for esophageal perforation, esophagram reassuring. Also noted to have hyponatremia with nephrology consulting, suspected SIADH. Discharged back to SNF w/lasix for 3 days, 1.2L fluid restriction.   On 3/19 began feeling weak, found to be hypotensive by EMS, witnessed syncopal episode in ambulance (while seated). Reported what looked like "peaked T waves" during the syncopal episode that were not present on arrival. In the ED orthostatic vital signs negative, Na stable at 128 (127 at DC), SCr 1.16 slightly up, BUN 30. Admission requested, salt tabs started, lasix held, and nephrology consulted.   Assessment & Plan: Principal Problem:   Syncope Active Problems:   Essential hypertension   Hyponatremia  Syncope/presyncope: Previously attributed to orthostasis and/or micturition. Seems to be progressively increasing in frequency. No dysrhythmias during recent admission.  -  Continue cardiac monitoring here. If any dysrhythmias, would consult cardiology. May benefit if symptoms persist regardless. Thus far on my personal review, only showing NSR with some sinus tachycardia with a single PVC. - Known aortic stenosis seen on echo previously not severe.  - Orthostatic vital signs thus far this admission have been normal, thus not going to start midodrine at this time.  - PT evaluation.  - Compression stockings.   Hyponatremia: SIADH suspected with additional contribution of hypovolemia from relative dehydration.  - Appreciate nephrology recommendations. Continuing fluid restriction, starting salt tablets. Not giving lasix due to concern for prerenal azotemia. - Check urine Osm, Na.   History of HTN: Off medications at this time.   GERD, hiatal hernia:  - Continue PPI  DVT prophylaxis: Lovenox Code Status: Full Family Communication: None at bedside, will reach out to daughter today. Disposition Plan:  Status is: Observation  The patient will require care spanning > 2 midnights and should be moved to inpatient because: Persistent severe electrolyte disturbances and Unsafe d/c plan  Dispo: The patient is from: SNF              Anticipated d/c is to: SNF              Patient currently is not medically stable to d/c.   Difficult to place patient No  Consultants:   Nephrology  Procedures:   None  Antimicrobials:  None   Subjective: Feels frustrated this morning as she has not been given any treatment yet. No further presyncope. Denies pain, denies chest pain or dyspnea. Doesn't feel that she has been eating or drinking poorly. No swelling. Has been wearing TED hose.  Objective: Vitals:   04/29/20 0110 04/29/20 0420 04/29/20 0817 04/29/20 1141  BP:  101/60 (!) 129/59 128/64  Pulse:  94  92 92  Resp:  20 18 18   Temp:  97.7 F (36.5 C) 97.7 F (36.5 C) 98 F (36.7 C)  TempSrc:  Oral Oral Oral  SpO2:  95% 95% 97%  Weight: 63.8 kg        Intake/Output Summary (Last 24 hours) at 04/29/2020 1556 Last data filed at 04/29/2020 0818 Gross per 24 hour  Intake 120 ml  Output --  Net 120 ml   Filed Weights   04/29/20 0110  Weight: 63.8 kg    Gen: Interactive elderly female in no distress Pulm: Non-labored breathing room air. Clear to auscultation bilaterally.  CV: Regular rate and rhythm. No murmur, rub, or gallop. No JVD, no pitting pedal edema with TED hose on. GI: Abdomen soft, non-tender, non-distended, with normoactive bowel sounds. No organomegaly or masses felt. Ext: Warm, no deformities Skin: No rashes, lesions or ulcers Neuro: Alert and oriented. No focal neurological deficits. Psych: Judgement and insight appear normal. Mood & affect appropriate.   Data Reviewed: I have personally reviewed following labs and imaging studies  CBC: Recent Labs  Lab 04/23/20 0652 04/24/20 0058 04/28/20 1641 04/29/20 0521  WBC 6.7 7.1 12.2* 10.2  NEUTROABS 4.4 4.2  --   --   HGB 11.9* 10.3* 14.6 12.0  HCT 34.9* 31.0* 43.5 34.9*  MCV 83.9 84.9 85.1 83.5  PLT 336 332 441* 633   Basic Metabolic Panel: Recent Labs  Lab 04/23/20 0652 04/24/20 0058 04/28/20 1641 04/29/20 0521  NA 128* 127* 128* 127*  K 4.2 4.3 5.2* 4.5  CL 96* 97* 97* 98  CO2 25 25 20* 22  GLUCOSE 142* 109* 160* 119*  BUN 17 23 30* 33*  CREATININE 0.97 0.97 1.16* 1.37*  CALCIUM 8.6* 8.3* 9.0 8.9  MG 1.6* 1.6*  --   --   PHOS 2.6 3.1  --   --    GFR: Estimated Creatinine Clearance: 25.5 mL/min (A) (by C-G formula based on SCr of 1.37 mg/dL (H)). Liver Function Tests: Recent Labs  Lab 04/23/20 0652 04/24/20 0058  AST 33 23  ALT 25 20  ALKPHOS 103 77  BILITOT 0.9 0.7  PROT 6.9 5.8*  ALBUMIN 2.5* 2.2*   No results for input(s): LIPASE, AMYLASE in the last 168 hours. No results for input(s): AMMONIA in the last 168 hours. Coagulation Profile: No results for input(s): INR, PROTIME in the last 168 hours. Cardiac Enzymes: No results for  input(s): CKTOTAL, CKMB, CKMBINDEX, TROPONINI in the last 168 hours. BNP (last 3 results) No results for input(s): PROBNP in the last 8760 hours. HbA1C: No results for input(s): HGBA1C in the last 72 hours. CBG: Recent Labs  Lab 04/23/20 1705 04/24/20 0050 04/24/20 0532 04/24/20 1043 04/29/20 0608  GLUCAP 130* 105* 96 124* 114*   Lipid Profile: No results for input(s): CHOL, HDL, LDLCALC, TRIG, CHOLHDL, LDLDIRECT in the last 72 hours. Thyroid Function Tests: No results for input(s): TSH, T4TOTAL, FREET4, T3FREE, THYROIDAB in the last 72 hours. Anemia Panel: No results for input(s): VITAMINB12, FOLATE, FERRITIN, TIBC, IRON, RETICCTPCT in the last 72 hours. Urine analysis:    Component Value Date/Time   COLORURINE AMBER (A) 04/28/2020 2057   APPEARANCEUR CLOUDY (A) 04/28/2020 2057   LABSPEC 1.017 04/28/2020 2057   LABSPEC 1.010 04/21/2011 1324   PHURINE 5.0 04/28/2020 2057   GLUCOSEU NEGATIVE 04/28/2020 2057   HGBUR NEGATIVE 04/28/2020 2057   BILIRUBINUR NEGATIVE 04/28/2020 2057   BILIRUBINUR Negative 04/21/2011 1324   KETONESUR 5 (A) 04/28/2020 2057  PROTEINUR NEGATIVE 04/28/2020 2057   UROBILINOGEN 0.2 05/18/2011 2014   NITRITE NEGATIVE 04/28/2020 2057   LEUKOCYTESUR TRACE (A) 04/28/2020 2057   LEUKOCYTESUR Moderate 04/21/2011 1324   Recent Results (from the past 240 hour(s))  Resp Panel by RT-PCR (Flu A&B, Covid) Nasopharyngeal Swab     Status: None   Collection Time: 04/24/20 10:23 AM   Specimen: Nasopharyngeal Swab; Nasopharyngeal(NP) swabs in vial transport medium  Result Value Ref Range Status   SARS Coronavirus 2 by RT PCR NEGATIVE NEGATIVE Final    Comment: (NOTE) SARS-CoV-2 target nucleic acids are NOT DETECTED.  The SARS-CoV-2 RNA is generally detectable in upper respiratory specimens during the acute phase of infection. The lowest concentration of SARS-CoV-2 viral copies this assay can detect is 138 copies/mL. A negative result does not preclude  SARS-Cov-2 infection and should not be used as the sole basis for treatment or other patient management decisions. A negative result may occur with  improper specimen collection/handling, submission of specimen other than nasopharyngeal swab, presence of viral mutation(s) within the areas targeted by this assay, and inadequate number of viral copies(<138 copies/mL). A negative result must be combined with clinical observations, patient history, and epidemiological information. The expected result is Negative.  Fact Sheet for Patients:  EntrepreneurPulse.com.au  Fact Sheet for Healthcare Providers:  IncredibleEmployment.be  This test is no t yet approved or cleared by the [place] FDA and  has been authorized for detection and/or diagnosis of SARS-CoV-2 by FDA under an Emergency Use Authorization (EUA). This EUA will remain  in effect (meaning this test can be used) for the duration of the COVID-19 declaration under Section 564(b)(1) of the Act, 21 U.S.C.section 360bbb-3(b)(1), unless the authorization is terminated  or revoked sooner.       Influenza A by PCR NEGATIVE NEGATIVE Final   Influenza B by PCR NEGATIVE NEGATIVE Final    Comment: (NOTE) The Xpert Xpress SARS-CoV-2/FLU/RSV plus assay is intended as an aid in the diagnosis of influenza from Nasopharyngeal swab specimens and should not be used as a sole basis for treatment. Nasal washings and aspirates are unacceptable for Xpert Xpress SARS-CoV-2/FLU/RSV testing.  Fact Sheet for Patients: EntrepreneurPulse.com.au  Fact Sheet for Healthcare Providers: IncredibleEmployment.be  This test is not yet approved or cleared by the [place] FDA and has been authorized for detection and/or diagnosis of SARS-CoV-2 by FDA under an Emergency Use Authorization (EUA). This EUA will remain in effect (meaning this test can be used) for the duration of  the COVID-19 declaration under Section 564(b)(1) of the Act, 21 U.S.C. section 360bbb-3(b)(1), unless the authorization is terminated or revoked.  Performed at [facility], [place] [address], [place], [place] [address]   SARS CORONAVIRUS 2 (TAT 6-24 HRS) Nasopharyngeal Nasopharyngeal Swab     Status: None   Collection Time: 04/29/20 12:23 AM   Specimen: Nasopharyngeal Swab  Result Value Ref Range Status   SARS Coronavirus 2 NEGATIVE NEGATIVE Final    Comment: (NOTE) SARS-CoV-2 target nucleic acids are NOT DETECTED.  The SARS-CoV-2 RNA is generally detectable in upper and lower respiratory specimens during the acute phase of infection. Negative results do not preclude SARS-CoV-2 infection, do not rule out co-infections with other pathogens, and should not be used as the sole basis for treatment or other patient management decisions. Negative results must be combined with clinical observations, patient history, and epidemiological information. The expected result is Negative.  Fact Sheet for Patients: SugarRoll.be  Fact Sheet for Healthcare Providers: https://www.woods-mathews.com/  This test is not yet approved or cleared by the [place] and  has been authorized for detection and/or diagnosis of SARS-CoV-2 by FDA under an Emergency Use Authorization (EUA). This EUA will remain  in effect (meaning this test can be used) for the duration of the COVID-19 declaration under Se ction 564(b)(1) of the Act, 21 U.S.C. section 360bbb-3(b)(1), unless the authorization is terminated or revoked sooner.  Performed at [facility], [place] [address] [place], [place] [address]       Radiology Studies: No results found.  Scheduled Meds: . cholecalciferol  2,000 Units Oral q AM  . cycloSPORINE  1 drop Both Eyes BID  . diclofenac Sodium  2 g Topical QID  . enoxaparin (LOVENOX) injection  40 mg Subcutaneous QHS  . magnesium oxide   400 mg Oral Daily  . melatonin  3 mg Oral QHS  . pantoprazole  40 mg Oral BID  . sodium chloride flush  3 mL Intravenous Q12H  . sodium chloride  1 g Oral TID WC   Continuous Infusions:   LOS: 0 days   Time spent: 35 minutes.  Patrecia Pour, MD [facility] [email or web address] 04/29/2020, 3:56 PM

## 2020-04-29 NOTE — Progress Notes (Signed)
[facility] NEPHROLOGY PROGRESS NOTE  Assessment/ Plan:  #Chronic hyponatremia: Patient is euvolemic with normal TSH level.  Our team recently saw the patient for hyponatremia thought to be due to SIADH, treated with loop diuretics and fluid restriction.  She is now readmitted with syncopal episode and found to have serum sodium level of 128 which is same as recent hospitalization. I agree that she likely has high ADH status but she probably also has decreased solute/oral intake.  I will repeat a urine sodium and urine osmolality.  Agree with starting salt tablet and holding Lasix because of elevated creatinine level.  Continue fluid restriction to 1.2 L in 24 hours.  No need for hypertonic saline.  #Syncope with orthostatic hypotension: Discontinue Lasix and antihypertensive.  Recent echo with EF of 98% with diastolic dysfunction.  May benefit from cardiology consult for the evaluation of arrhythmia.   #Acute kidney injury likely hemodynamically mediated due to hypotension concomitant with the use of diuretics.  Blood pressure is acceptable.  Hold diuretics.  Encourage oral intake.  Strict ins and outs and daily lab.  #Protein calorie malnutrition: Also with albuminemia.  Recommend dietary consult and encourage oral intake.  Subjective: Seen and examined at bedside.  Patient reports feeling chronically weak and not really have good appetite.  Denies nausea, vomiting, chest pain, shortness of breath, headache or dizziness. Objective Vital signs in last 24 hours: Vitals:   04/29/20 0108 04/29/20 0110 04/29/20 0420 04/29/20 0817  BP: 102/60  101/60 (!) 129/59  Pulse: 98  94 92  Resp: 19  20 18   Temp: 98.2 F (36.8 C)  97.7 F (36.5 C) 97.7 F (36.5 C)  TempSrc: Oral  Oral Oral  SpO2: 97%  95% 95%  Weight:  63.8 kg     Weight change:   Intake/Output Summary (Last 24 hours) at 04/29/2020 1137 Last data filed at 04/29/2020 0818 Gross per 24 hour  Intake 120 ml  Output --   Net 120 ml       Labs: Basic Metabolic Panel: Recent Labs  Lab 04/23/20 0652 04/24/20 0058 04/28/20 1641 04/29/20 0521  NA 128* 127* 128* 127*  K 4.2 4.3 5.2* 4.5  CL 96* 97* 97* 98  CO2 25 25 20* 22  GLUCOSE 142* 109* 160* 119*  BUN 17 23 30* 33*  CREATININE 0.97 0.97 1.16* 1.37*  CALCIUM 8.6* 8.3* 9.0 8.9  PHOS 2.6 3.1  --   --    Liver Function Tests: Recent Labs  Lab 04/23/20 0652 04/24/20 0058  AST 33 23  ALT 25 20  ALKPHOS 103 77  BILITOT 0.9 0.7  PROT 6.9 5.8*  ALBUMIN 2.5* 2.2*   No results for input(s): LIPASE, AMYLASE in the last 168 hours. No results for input(s): AMMONIA in the last 168 hours. CBC: Recent Labs  Lab 04/23/20 0652 04/24/20 0058 04/28/20 1641 04/29/20 0521  WBC 6.7 7.1 12.2* 10.2  NEUTROABS 4.4 4.2  --   --   HGB 11.9* 10.3* 14.6 12.0  HCT 34.9* 31.0* 43.5 34.9*  MCV 83.9 84.9 85.1 83.5  PLT 336 332 441* 348   Cardiac Enzymes: No results for input(s): CKTOTAL, CKMB, CKMBINDEX, TROPONINI in the last 168 hours. CBG: Recent Labs  Lab 04/23/20 1705 04/24/20 0050 04/24/20 0532 04/24/20 1043 04/29/20 0608  GLUCAP 130* 105* 96 124* 114*    Iron Studies: No results for input(s): IRON, TIBC, TRANSFERRIN, FERRITIN in the last 72 hours. Studies/Results: No results found.  Medications: Infusions:  Scheduled Medications: . cholecalciferol  2,000 Units Oral q AM  . cycloSPORINE  1 drop Both Eyes BID  . diclofenac Sodium  2 g Topical QID  . enoxaparin (LOVENOX) injection  40 mg Subcutaneous QHS  . magnesium oxide  400 mg Oral Daily  . melatonin  3 mg Oral QHS  . pantoprazole  40 mg Oral BID  . sodium chloride flush  3 mL Intravenous Q12H  . sodium chloride  1 g Oral TID WC    have reviewed scheduled and prn medications.  Physical Exam: General: Elderly female, not in distress Heart:RRR, s1s2 nl Lungs:clear b/l, no crackle Abdomen:soft, Non-tender, non-distended Extremities:No edema Neurology: Alert awake,  following commands  [name] [name] 04/29/2020,11:37 AM  LOS: 0 days

## 2020-04-29 NOTE — ED Notes (Signed)
Attempted to call report. Nurse requested for covid swab to be completed before being willing to take report.

## 2020-04-30 DIAGNOSIS — R55 Syncope and collapse: Secondary | ICD-10-CM | POA: Diagnosis not present

## 2020-04-30 DIAGNOSIS — Z7401 Bed confinement status: Secondary | ICD-10-CM | POA: Diagnosis not present

## 2020-04-30 DIAGNOSIS — E871 Hypo-osmolality and hyponatremia: Secondary | ICD-10-CM | POA: Diagnosis not present

## 2020-04-30 DIAGNOSIS — N179 Acute kidney failure, unspecified: Secondary | ICD-10-CM | POA: Diagnosis not present

## 2020-04-30 DIAGNOSIS — I10 Essential (primary) hypertension: Secondary | ICD-10-CM | POA: Diagnosis not present

## 2020-04-30 DIAGNOSIS — M2550 Pain in unspecified joint: Secondary | ICD-10-CM | POA: Diagnosis not present

## 2020-04-30 DIAGNOSIS — I951 Orthostatic hypotension: Secondary | ICD-10-CM | POA: Diagnosis not present

## 2020-04-30 LAB — RENAL FUNCTION PANEL
Albumin: 2.4 g/dL — ABNORMAL LOW (ref 3.5–5.0)
Anion gap: 6 (ref 5–15)
BUN: 29 mg/dL — ABNORMAL HIGH (ref 8–23)
CO2: 24 mmol/L (ref 22–32)
Calcium: 8.7 mg/dL — ABNORMAL LOW (ref 8.9–10.3)
Chloride: 100 mmol/L (ref 98–111)
Creatinine, Ser: 1.06 mg/dL — ABNORMAL HIGH (ref 0.44–1.00)
GFR, Estimated: 51 mL/min — ABNORMAL LOW (ref >60)
Glucose, Bld: 97 mg/dL (ref 70–99)
Phosphorus: 2.8 mg/dL (ref 2.5–4.6)
Potassium: 4.6 mmol/L (ref 3.5–5.1)
Sodium: 130 mmol/L — ABNORMAL LOW (ref 135–145)

## 2020-04-30 LAB — GLUCOSE, CAPILLARY: Glucose-Capillary: 90 mg/dL (ref 70–99)

## 2020-04-30 MED ORDER — SODIUM CHLORIDE 1 G PO TABS: 1.0000 g | ORAL_TABLET | Freq: Three times a day (TID) | ORAL | Status: DC

## 2020-04-30 NOTE — Evaluation (Signed)
Physical Therapy Evaluation Patient Details Name: [name] MRN: [id number] DOB: [birth year] Today's Date: 04/30/2020   History of Present Illness  [name] is an 87 y/o female who reported to ED from [facility] and admitted on 04/28/20 due to syncopal episode secondary to SIADH. Pt was previously admitted from 3/7-3/15 for hypoatremia and from 2/25-3/2 for syncope with fall. PMH includes ovarian CA, HTN, HLd, chronic hypoatremia, and insomnia.    Clinical Impression  Pt received in bed, cooperative. Needed min up to max assistance for most mobility. Pt limited by lightheadedness and BP as well as associated anxiety related to movement. Pt symptomatic after every change of position. BPs listed below, but suspect that BP in sitting may have been lower due to increased symptoms and inability for machine to get reading on first attempt. Pt unable to tolerate standing due to symptoms. Left in bed with all needs met, call bell within reach, and bed alarm active. Pt would benefit from PT to increase tolerance to mobility and independency. Recommend return to SNF for continued rehab.  Supine BP = 147/68 Sitting = 129/68 Standing = unable to tolerate     Follow Up Recommendations SNF;Supervision for mobility/OOB    Equipment Recommendations  Wheelchair (measurements PT);Wheelchair cushion (measurements PT)    Recommendations for Other Services       Precautions / Restrictions Precautions Precautions: Fall;Other (comment) Precaution Comments: watch BP- orthostasis, HOH Other Brace: R ankle brace and ortho shoe Restrictions Weight Bearing Restrictions: No      Mobility  Bed Mobility Overal bed mobility: Needs Assistance Bed Mobility: Sit to Supine;Rolling;Sidelying to Sit Rolling: Supervision Sidelying to sit: Min assist   Sit to supine: Supervision   General bed mobility comments: Use of bedrails for rolling, min A to bring trunk upright, pt anxious to sit up, no assistance  for sit to supine    Transfers Overall transfer level: Needs assistance Equipment used: Rolling walker (2 wheeled) Transfers: Sit to/from Stand   Sit to stand: Max assist       General transfer comment: Max A for sit to stand, pt very symptomatic upon standing unable to stand for > 10 seconds and needed to sit back down  Ambulation/Gait             General Gait Details: unable  Stairs            Wheelchair Mobility    Modified Rankin (Stroke Patients Only)       Balance Overall balance assessment: Needs assistance;History of Falls Sitting-balance support: No upper extremity supported Sitting balance-Leahy Scale: Fair Sitting balance - Comments: sitting without UE support EOB, able to sit while assistance given to adjust footware   Standing balance support: Bilateral upper extremity supported Standing balance-Leahy Scale: Poor Standing balance comment: Reliant on B UE and external assistance                             Pertinent Vitals/Pain Pain Assessment: No/denies pain    Home Living Family/patient expects to be discharged to:: Skilled nursing facility                 Additional Comments: Pt at SNF since last admission and was alone in ILF prior to that    Prior Function Level of Independence: Needs assistance   Gait / Transfers Assistance Needed: supervision for all transfers due to syncope and BP issues     Comments: uses rollator- but  reports she's not been getting around much lately due to her medical condition     Hand Dominance   Dominant Hand: Right    Extremity/Trunk Assessment   Upper Extremity Assessment Upper Extremity Assessment: Generalized weakness    Lower Extremity Assessment Lower Extremity Assessment: Generalized weakness       Communication   Communication: HOH  Cognition Arousal/Alertness: Awake/alert Behavior During Therapy: WFL for tasks assessed/performed;Anxious Overall Cognitive Status:  No family/caregiver present to determine baseline cognitive functioning                               Problem Solving: Slow processing General Comments: A&Ox4. Needed statements and questions repeatedly, partly due to [facility] but some slow processing. Anxious about movement due to history of syncopal episodes      General Comments      Exercises     Assessment/Plan    PT Assessment Patient needs continued PT services  PT Problem List Decreased strength;Decreased balance;Decreased activity tolerance;Decreased mobility;Decreased cognition;Decreased safety awareness;Decreased knowledge of use of DME;Decreased knowledge of precautions;Cardiopulmonary status limiting activity;Decreased coordination       PT Treatment Interventions DME instruction;Gait training;Functional mobility training;Therapeutic exercise;Balance training;Therapeutic activities;Patient/family education    PT Goals (Current goals can be found in the Care Plan section)  Acute Rehab PT Goals Patient Stated Goal: feel better PT Goal Formulation: With patient Time For Goal Achievement: 05/14/20 Potential to Achieve Goals: Fair    Frequency Min 2X/week   Barriers to discharge        Co-evaluation               AM-PAC PT "6 Clicks" Mobility  Outcome Measure Help needed turning from your back to your side while in a flat bed without using bedrails?: A Little Help needed moving from lying on your back to sitting on the side of a flat bed without using bedrails?: A Little Help needed moving to and from a bed to a chair (including a wheelchair)?: A Lot Help needed standing up from a chair using your arms (e.g., wheelchair or bedside chair)?: A Lot Help needed to walk in hospital room?: Total Help needed climbing 3-5 steps with a railing? : Total 6 Click Score: 12    End of Session Equipment Utilized During Treatment: Gait belt Activity Tolerance: Other (comment) (limited by lightheadedness and  BPs) Patient left: with call bell/phone within reach;in bed;with bed alarm set Nurse Communication: Mobility status PT Visit Diagnosis: Unsteadiness on feet (R26.81);Muscle weakness (generalized) (M62.81);Difficulty in walking, not elsewhere classified (R26.2)    Time:  -      Charges:             Rosita Kea, SPT

## 2020-04-30 NOTE — TOC Transition Note (Signed)
Transition of Care [facility]) - CM/SW Discharge Note   Patient Details  Name: [name] MRN: [id number] Date of Birth: 1929/10/16  [facility] of [facility]) CM/SW Contact:  Emeterio Reeve, [place] Phone Number: 04/30/2020, 11:49 AM   Clinical Narrative:     [facility]  Anticipated DC date: 04/30/20 Family notified: Daughter, Catering manager by: Corey Harold     Per MD patient ready for DC to [facility] [place], patient, patient's family, and facility notified of DC. Discharge Summary and FL2 sent to facility. DC packet on chart. Ambulance transport requested for patient.    RN to call report to [phone].   Daughter requesting AVS be emailed to [email or web address]  CSW will sign off for now as social work intervention is no longer needed. Please consult [place] again if new needs arise.  Final next level of care: Skilled Nursing Facility Barriers to Discharge: Barriers Resolved   Patient Goals and CMS Choice Patient states their goals for this hospitalization and ongoing recovery are:: return to [facility] CMS Medicare.gov Compare Post Acute Care list provided to:: Patient Choice offered to / list presented to : Patient  Discharge Placement              Patient chooses bed at: [facility] Patient to be transferred to facility by: [facility] Name of family member notified: Daughter, Almyra Free Patient and family notified of of transfer: 04/30/20  Discharge Plan and Services                                     Social Determinants of Health (SDOH) Interventions     Readmission Risk Interventions No flowsheet data found.   Emeterio Reeve, Latanya Presser, [place] Social Worker [phone]

## 2020-04-30 NOTE — Progress Notes (Signed)
[name]  Assessment/ Plan:  #Chronic hyponatremia, improved: Patient is euvolemic with normal TSH level.  Our team recently saw the patient for hyponatremia thought to be due to SIADH, treated with loop diuretics and fluid restriction.  She is now readmitted with syncopal episode and found to have serum sodium level of 128 which is same as recent hospitalization. -urine studies consistent with SIADH, I wonder if this is secondary to orthostatic hypotension. Her low PO intake is also a contributing factor. Continue with fluid restriction to 1.2L/day. Utilize lasix as needed from a volume standpoint. Continue with salt tabs 1g tid. From a hyponatremia standpoint, would be okay with discharge  #Syncope with orthostatic hypotension: Discontinue Lasix and antihypertensive.  Recent echo with EF of 56% with diastolic dysfunction.  May benefit from cardiology consult for the evaluation of arrhythmia.   #Acute kidney injury likely hemodynamically mediated due to hypotension concomitant with the use of diuretics.  Blood pressure is acceptable.  Hold diuretics (use only prn).  Encourage oral intake.  Strict ins and outs and daily lab. -Cr improved today, down to 1  #Protein calorie malnutrition: Also with albuminemia.  Recommend dietary consult and encourage oral intake.  Will sign off from a nephrology perspective. Thank you for involving [place] in the care of this patient. Please call with any questions/concerns. Recommend that she has labs done this coming Wednesday. If needed, she can be referred from her facility if Na levels are not stable.  Subjective: Seen and examined at bedside.  Patient reports feeling better. Eager to be discharged. No complaints.  Objective Vital signs in last 24 hours: Vitals:   04/29/20 1141 04/29/20 1603 04/29/20 2340 04/30/20 0537  BP: 128/64 129/64 136/72 (!) 150/76  Pulse: 92 91 87 86  Resp: 18 20 20 19   Temp: 98 F (36.7  C) 98.5 F (36.9 C) 99.3 F (37.4 C) 98.5 F (36.9 C)  TempSrc: Oral Axillary Oral Oral  SpO2: 97% 97% 97% 96%  Weight:    73.2 kg   Weight change: 9.4 kg  Intake/Output Summary (Last 24 hours) at 04/30/2020 1221 Last data filed at 04/30/2020 2130 Gross per 24 hour  Intake --  Output 850 ml  Net -850 ml       Labs: Basic Metabolic Panel: Recent Labs  Lab 04/24/20 0058 04/28/20 1641 04/29/20 0521 04/30/20 0340  NA 127* 128* 127* 130*  K 4.3 5.2* 4.5 4.6  CL 97* 97* 98 100  CO2 25 20* 22 24  GLUCOSE 109* 160* 119* 97  BUN 23 30* 33* 29*  CREATININE 0.97 1.16* 1.37* 1.06*  CALCIUM 8.3* 9.0 8.9 8.7*  PHOS 3.1  --   --  2.8   Liver Function Tests: Recent Labs  Lab 04/24/20 0058 04/30/20 0340  AST 23  --   ALT 20  --   ALKPHOS 77  --   BILITOT 0.7  --   PROT 5.8*  --   ALBUMIN 2.2* 2.4*   No results for input(s): LIPASE, AMYLASE in the last 168 hours. No results for input(s): AMMONIA in the last 168 hours. CBC: Recent Labs  Lab 04/24/20 0058 04/28/20 1641 04/29/20 0521  WBC 7.1 12.2* 10.2  NEUTROABS 4.2  --   --   HGB 10.3* 14.6 12.0  HCT 31.0* 43.5 34.9*  MCV 84.9 85.1 83.5  PLT 332 441* 348   Cardiac Enzymes: No results for input(s): CKTOTAL, CKMB, CKMBINDEX, TROPONINI in the last 168 hours. CBG: Recent  Labs  Lab 04/24/20 0050 04/24/20 0532 04/24/20 1043 04/29/20 0608 04/30/20 0600  GLUCAP 105* 96 124* 114* 90    Iron Studies: No results for input(s): IRON, TIBC, TRANSFERRIN, FERRITIN in the last 72 hours. Studies/Results: No results found.  Medications: Infusions:   Scheduled Medications: . cholecalciferol  2,000 Units Oral q AM  . cycloSPORINE  1 drop Both Eyes BID  . diclofenac Sodium  2 g Topical QID  . enoxaparin (LOVENOX) injection  40 mg Subcutaneous QHS  . magnesium oxide  400 mg Oral Daily  . melatonin  3 mg Oral QHS  . pantoprazole  40 mg Oral BID  . sodium chloride flush  3 mL Intravenous Q12H  . sodium chloride  1  g Oral TID WC    have reviewed scheduled and prn medications.  Physical Exam: General: Elderly female, not in distress Heart:RRR, s1s2 nl Lungs:clear b/l, no crackle Abdomen:soft, Non-tender, non-distended Extremities:No edema Neurology: Alert awake, following commands  Santo Zahradnik 04/30/2020,12:21 PM  LOS: 0 days

## 2020-04-30 NOTE — Discharge Summary (Signed)
Physician Discharge Summary  [name] FTD:[id number] DOB: February 28, 1929 DOA: 04/28/2020  PCP: Hulan Fess, MD  Admit date: 04/28/2020 Discharge date: 04/30/2020  Admitted From: SNF Disposition: SNF   Recommendations for Outpatient Follow-up:  1. Please recheck basic metabolic panel (with attention to sodium and creatinine) by the end of this week. Stopped lasix, started salt tablets, continue 1.2L per day fluid restriction.  2. If hyponatremia persists, recommend pursuing SIADH work up with or without nephrology referral.  3. Follow up with cardiology for syncope. Pt's cardiac monitoring has been approved and is to be arranged as an outpatient shortly.  Home Health: None Equipment/Devices: None new Discharge Condition: Stable, improved CODE STATUS: Full Diet recommendation: Fluid restriction to 1,200cc/day  Brief/Interim Summary: [name] is an 87 y.o. female with a history of ovarian CA s/p TAH/BSO Feb 2013 in remission, chronic hyponatremia, GERD with hiatal hernia, and HTN who returned to the ED on 04/28/2020 with syncope.   - Seen in ED 03/02/2020 with presyncope  - Admission 04/06/2020 - 04/11/2020 for syncopewith fall from commode with facial trauma, suspected orthostatic versus micturition syncope+/- ambien effect.TTE 2/25: LVEF 70- 75%, G1DD, mild AR andAS. Discharged to SNF with TED hose, encouraging PO intake, and holding ambien, norvasc,and nadolol. Lisinopril continued.   - Readmission 04/16/20 - 04/24/20 syncope while ambulating to bathroom. Orthostatic hypotension noted. Shewastreated with IVF, compression stockings, abdominal binder for orthostatic hypotension. Cardiology consulted, though no dysrhythmias noted, outpatient cardiac monitor arranged.Hospitalization complicated by abdominal imaging concerning for esophageal perforation, esophagram reassuring. Also noted to have hyponatremia with nephrology consulting, suspected SIADH. Discharged back to SNF  w/lasix for 3 days, 1.2L fluid restriction.   On 3/19 began feeling weak, found to be hypotensive by EMS, witnessed syncopal episode in ambulance (while seated).Reported what looked like "peaked T waves" during the syncopal episode that were not present on arrival. In the ED orthostatic vital signs negative, Na stable at 128 (127 at DC), SCr 1.16 slightly up, BUN 30. Admission requested, salt tabs started, lasix held, and nephrology consulted.   Sodium level improved to 130, pt's strength improved and vital signs remained stable. No dysrhythmias have been noted on telemetry monitoring and no syncopal/presyncopal symptoms have appeared. Orthostatic vital signs have remained normal.  Discharge Diagnoses:  Principal Problem:   Syncope Active Problems:   Essential hypertension   Hyponatremia  Syncope/presyncope: Previously attributed to orthostasis and/or micturition. Seems to be progressively increasing in frequency. This episode likely precipitated by dehydration. - Cardiac monitoring only showing NSR with some sinus tachycardia with a single PVC. - Known aortic stenosis seen on echo previously not severe.  - Orthostatic vital signs thus far this admission have been normal, thus not going to start midodrine at this time.  - Compression stockings.   Hyponatremia: SIADH suspected with additional contribution of hypovolemia from relative dehydration. Urine studies continue to indicate inappropriate ADH.  - Appreciate nephrology recommendations. Continuing fluid restriction, TID salt tablets. Can give lasix prn, though will not prescribe at discharge. - Na 130 at discharge, please recheck by the end of this week.   History of HTN: Off medications at this time. Orthostatics negative.  GERD, hiatal hernia:  - Continue PPI  Discharge Instructions Discharge Instructions    Diet general   Complete by: As directed    Discharge instructions   Complete by: As directed    You were admitted  for syncope (passing out) and hyponatremia (low sodium) which have resolved and improved, respectively. You may have been  slightly dehydrated from the lasix which we recommend stopping at this time. You will continue with salt tablets three times daily and have sodium level rechecked later this week. A cardiac monitor is approved and planned for you as an outpatient. Your cardiac studies and rhythm have been normal throughout your stay here.   Increase activity slowly   Complete by: As directed      Allergies as of 04/30/2020      Reactions   Morphine And Related Anaphylaxis, Other (See Comments)   Given after knee replacement and code blue occurred   Penicillins Hives, Itching, Other (See Comments)   Syncope, also   Nsaids Other (See Comments)   Because of an abnormal kidney test result   Shellfish Allergy Nausea And Vomiting, Other (See Comments)   Pt has shellfish allergy only.  Has had IV contrast x 2 and did fine.      Medication List    TAKE these medications   acetaminophen 500 MG tablet Commonly known as: TYLENOL Take 2 tablets (1,000 mg total) by mouth 3 (three) times daily.   azelastine 0.1 % nasal spray Commonly known as: ASTELIN Place 1 spray into both nostrils daily as needed for rhinitis or allergies.   B-COMPLEX/FOLIC ACID/VITAMIN C PO Take 1 tablet by mouth daily with breakfast.   calcium carbonate 750 MG chewable tablet Commonly known as: TUMS EX Chew 750 mg by mouth daily as needed for heartburn.   diclofenac Sodium 1 % Gel Commonly known as: VOLTAREN Apply 2 g topically 4 (four) times daily. What changed:   when to take this  additional instructions   docusate sodium 100 MG capsule Commonly known as: COLACE Take 100 mg by mouth daily as needed for mild constipation.   magnesium oxide 400 (241.3 Mg) MG tablet Commonly known as: MAG-OX Take 1 tablet (400 mg total) by mouth daily for 14 days.   meclizine 12.5 MG tablet Commonly known as:  ANTIVERT Take 1 tablet (12.5 mg total) by mouth 2 (two) times daily as needed for dizziness.   melatonin 3 MG Tabs tablet Take 1 tablet (3 mg total) by mouth at bedtime.   pantoprazole 40 MG tablet Commonly known as: PROTONIX Take 1 tablet (40 mg total) by mouth 2 (two) times daily.   Restasis 0.05 % ophthalmic emulsion Generic drug: cycloSPORINE Place 1 drop into both eyes in the morning and at bedtime.   sodium chloride 1 g tablet Take 1 tablet (1 g total) by mouth 3 (three) times daily with meals.   Vitamin D3 50 MCG (2000 UT) Tabs Take 2,000 Units by mouth in the morning.   zinc oxide 20 % ointment Apply 1 application topically as needed (for redness- to be applied to buttocks/peri area and after every incontinent episode).       Follow-up Information    Little, Lennette Bihari, MD. Schedule an appointment as soon as possible for a visit.   Specialty: Family Medicine Contact information: [place] [address] [phone]        Donato Heinz, MD .   Specialties: Cardiology, Radiology Contact information: [address] [place] [address] [phone]              Allergies  Allergen Reactions  . Morphine And Related Anaphylaxis and Other (See Comments)    Given after knee replacement and code blue occurred  . Penicillins Hives, Itching and Other (See Comments)    Syncope, also  . Nsaids Other (  See Comments)    Because of an abnormal kidney test result  . Shellfish Allergy Nausea And Vomiting and Other (See Comments)    Pt has shellfish allergy only.  Has had IV contrast x 2 and did fine.    Consultations:  Nephrology, [facility]  Procedures/Studies: CT ABDOMEN PELVIS WO CONTRAST  Addendum Date: 04/17/2020   ADDENDUM REPORT: 04/17/2020 16:01 ADDENDUM: Findings should include old healed right ninth and tenth rib fractures. Electronically Signed   By: Iven Finn M.D.   On: 04/17/2020 16:01    Result Date: 04/17/2020 CLINICAL DATA:  Nonlocalized acute abdominal pain. EXAM: CT ABDOMEN AND PELVIS WITHOUT CONTRAST TECHNIQUE: Multidetector CT imaging of the abdomen and pelvis was performed following the standard protocol without IV contrast. COMPARISON:  CT angiography chest 04/16/2020, CT abdomen pelvis 03/05/2011 FINDINGS: Lower chest: Persistent trace right pleural effusion. Bilateral lower lobe subsegmental atelectasis. At least small volume hiatal hernia with persistent distal esophageal thickening. Medial left base atelectasis (6:60); however, a component of trace inferior pneumomediastinum cannot be excluded (3: 4-11). Hepatobiliary: No focal liver abnormality. The gallbladder is contracted. Punctate calcific density within the gallbladder lumen likely represents a gallstone. No gallbladder wall thickening or pericholecystic fluid. No biliary dilatation. Pancreas: No focal lesion. Normal pancreatic contour. No surrounding inflammatory changes. No main pancreatic ductal dilatation. Spleen: Normal in size without focal abnormality. Adrenals/Urinary Tract: No adrenal nodule bilaterally. Punctate calcific densities within bilateral kidneys. No hydronephrosis and no contour-deforming renal mass. No ureterolithiasis or hydroureter. Fluid fluid level within the urinary bladder likely related to previously administered intravenous contrast for CT angiography chest 04/16/2020. Stomach/Bowel: Stomach is within normal limits. No evidence of bowel wall thickening or dilatation. Diffuse colonic diverticulosis. No pneumatosis. The appendix not definitely identified. Vascular/Lymphatic: Several punctate phleboliths are noted within the pelvis. Focal dilatation of the infrarenal abdominal aorta measuring up to 2.8 cm in diameter with no definite abdominal aorta or iliac aneurysm. Moderate to severe atherosclerotic plaque of the aorta and its branches. No abdominal, pelvic, or inguinal lymphadenopathy. Reproductive:  Status post hysterectomy. No adnexal masses. Other: No intraperitoneal free fluid. No intraperitoneal free gas. No organized fluid collection. Musculoskeletal: Two large consecutive anterior abdominal wall hernias with the likely paraumbilical hernia containing a long segment of the mid to distal transverse colon with an abdominal defect of 5.8 x 4.5 cm. The inferior-most hernia likely a infraumbilical hernia contains several loops of small bowel and demonstrates a abdominal defect of approximately 5.8 x 3.7 cm. Grade 1 anterolisthesis of L4 on L5. Multilevel facet arthropathy. Visualized lower thoracic spine demonstrates multilevel osteophyte formation. No suspicious lytic or blastic osseous lesions. No acute displaced fracture. IMPRESSION: 1. Question trace inferior pneumomediastinum. In the setting of a small hiatal hernia and distal esophageal thickening, a small or contained esophageal perforation cannot be excluded. 2. Two large consecutive anterior abdominal wall hernias, one of which is likely paraumbilical, containing small and large bowel. No definite findings of ischemia or associated bowel obstruction with however limited evaluation due to venous noncontrast study. 3. Colonic diverticulosis with no acute diverticulitis. 4. Nonobstructive punctate nephrolithiasis bilaterally. 5. Aortic Atherosclerosis (ICD10-I70.0). These results were called by telephone at the time of interpretation on 04/17/2020 at 3:45 pm to provider [facility] , who verbally acknowledged these results. Electronically Signed: By: Iven Finn M.D. On: 04/17/2020 15:52   DG Ankle Complete Right  Result Date: 04/06/2020 CLINICAL DATA:  Syncopal episode. EXAM: RIGHT ANKLE - COMPLETE 3+ VIEW COMPARISON:  June 09, 2017 FINDINGS:  There is no evidence of an acute fracture, dislocation, or joint effusion. A chronic deformity is seen involving the distal shaft of the right fibula. There is a moderate-sized plantar calcaneal spur. Soft  tissues are unremarkable. IMPRESSION: Chronic deformity of the distal right fibula. Electronically Signed   By: Virgina Norfolk M.D.   On: 04/06/2020 02:45   CT HEAD WO CONTRAST  Result Date: 04/06/2020 CLINICAL DATA:  Syncopal episode. EXAM: CT HEAD WITHOUT CONTRAST TECHNIQUE: Contiguous axial images were obtained from the base of the skull through the vertex without intravenous contrast. COMPARISON:  March 02, 2020 FINDINGS: Brain: There is mild cerebral atrophy with widening of the extra-axial spaces and ventricular dilatation. There are areas of decreased attenuation within the white matter tracts of the supratentorial brain, consistent with microvascular disease changes. A stable 1 cm para falcine calcified right occipital lobe meningioma is noted. Vascular: No hyperdense vessels are identified. Skull: Nondisplaced bilateral nasal bone fractures of indeterminate age are seen. Sinuses/Orbits: Marked severity sphenoid sinus mucosal thickening is present. Other: Mild to moderate severity frontal scalp soft tissue swelling is noted along the midline. IMPRESSION: 1. Mild to moderate severity frontal scalp soft tissue swelling without evidence of an acute fracture or acute intracranial abnormality. 2. Stable 1 cm para falcine calcified right occipital lobe meningioma. 3. Bilateral nondisplaced nasal bone fractures of indeterminate age. Electronically Signed   By: Virgina Norfolk M.D.   On: 04/06/2020 02:42   CT Angio Chest PE W and/or Wo Contrast  Result Date: 04/16/2020 CLINICAL DATA:  Chest pain, history of prior fall, initial encounter EXAM: CT ANGIOGRAPHY CHEST WITH CONTRAST TECHNIQUE: Multidetector CT imaging of the chest was performed using the standard protocol during bolus administration of intravenous contrast. Multiplanar CT image reconstructions and MIPs were obtained to evaluate the vascular anatomy. CONTRAST:  19mL OMNIPAQUE IOHEXOL 350 MG/ML SOLN COMPARISON:  Chest x-ray from earlier in the  same day. FINDINGS: Cardiovascular: Thoracic aorta demonstrates atherosclerotic calcifications without aneurysmal dilatation. The degree of aortic enhancement is not sufficient to evaluate for dissection. No cardiac enlargement is seen. Diffuse coronary calcifications are noted. The pulmonary artery shows a normal branching pattern. No filling defect to suggest pulmonary embolism is noted. Mediastinum/Nodes: Thoracic inlet is within normal limits. No sizable hilar or mediastinal adenopathy is noted. Esophageal thickening is noted distally likely related to reflux. Hiatal hernia is seen. Lungs/Pleura: Mild bibasilar atelectatic changes are seen. Small right pleural effusion is noted. No sizable infiltrate is seen. No sizable parenchymal nodules are noted. Upper Abdomen: Visualized upper abdomen is otherwise within normal limits. Musculoskeletal: Degenerative changes of the thoracic spine are seen. No acute rib abnormality is seen. Review of the MIP images confirms the above findings. IMPRESSION: No evidence of pulmonary emboli. Small right pleural effusion and bibasilar atelectatic changes. Hiatal hernia with distal esophageal thickening likely related to reflux. Aortic Atherosclerosis (ICD10-I70.0). Electronically Signed   By: Inez Catalina M.D.   On: 04/16/2020 19:55   DG CHEST PORT 1 VIEW  Result Date: 04/18/2020 CLINICAL DATA:  History of prior fall.  History of chest pain. EXAM: PORTABLE CHEST 1 VIEW COMPARISON:  CT 04/16/2020.  Chest x-ray 04/16/2020. FINDINGS: Mediastinum hilar structures normal. Heart size normal. No focal infiltrate. No pleural effusion or pneumothorax. Prominent skin folds noted. Degenerative change thoracic spine. IMPRESSION: No acute cardiopulmonary disease.  Chest is stable from prior exam. Electronically Signed   By: [place]   On: 04/18/2020 07:39   DG Chest Portable 1 View  Result Date:  04/16/2020 CLINICAL DATA:  Recent fall, chest pain EXAM: PORTABLE CHEST 1 VIEW  COMPARISON:  08/04/2019 FINDINGS: Single frontal view of the chest demonstrates an unremarkable cardiac silhouette. No airspace disease, effusion, or pneumothorax. No acute bony abnormalities. IMPRESSION: 1. No acute intrathoracic process. Electronically Signed   By: Randa Ngo M.D.   On: 04/16/2020 18:49   DG Foot 2 Views Right  Result Date: 04/18/2020 CLINICAL DATA:  Pain following recent fall EXAM: RIGHT FOOT - 2 VIEW COMPARISON:  March 19, 2020. FINDINGS: Frontal and lateral views obtained. Subtle transverse lucencies in the proximal third and fourth metatarsals are stable, concerning for nondisplaced fractures. Evidence of old trauma with remodeling distal fourth metatarsal. No evidence of new fracture compared to prior study. No dislocation. There is underlying osteoporosis. No appreciable joint space narrowing. There is an inferior calcaneal spur. There is mild spurring in the dorsal midfoot. Foci of arterial vascular calcification noted. IMPRESSION: Persistent subtle transverse lucencies in the proximal third and proximal fourth metatarsals, unchanged from prior study and likely representing subtle nondisplaced fractures in these areas. Old healed fracture distal fourth metatarsal with remodeling. No acute fracture compared to prior study. No dislocation. Mild spurring dorsal midfoot. Inferior calcaneal spur. Foci of arterial vascular atherosclerotic calcification noted. Electronically Signed   By: Lowella Grip III M.D.   On: 04/18/2020 11:16   DG Foot Complete Right  Result Date: 04/08/2020 CLINICAL DATA:  Lateral foot pain and swelling. EXAM: RIGHT FOOT COMPLETE - 3+ VIEW COMPARISON:  None. FINDINGS: Old fourth metatarsal fracture. Mild diffuse decreased bone mineralization. Mild degenerate changes over the midfoot and hindfoot. Small inferior calcaneal spur. Subtle transverse lucencies at the base of the third and fourth metatarsals on the oblique view likely within normal although could  not exclude subtle nondisplaced fractures. IMPRESSION: Subtle transverse lucencies at the base of the third and fourth metatarsals on the oblique view likely within normal, although could represent subtle nondisplaced fractures. Consider follow-up radiograph 7-10 days. Electronically Signed   By: Marin Olp M.D.   On: 04/08/2020 11:12   ECHOCARDIOGRAM COMPLETE  Result Date: 04/06/2020    ECHOCARDIOGRAM REPORT   Patient Name:   TEA COLLUMS [facility] Date of Exam: 04/06/2020 Medical Rec #:  [id number]        Height:       63.0 in Accession #:    [id number]       Weight:       155.0 lb Date of Birth:  September 13, 1929        BSA:          1.735 m Patient Age:    12 years         BP:           140/64 mmHg Patient Gender: F                HR:           84 bpm. Exam Location:  Inpatient Procedure: 2D Echo, Cardiac Doppler and Color Doppler Indications:    R55 Syncope  History:        Patient has prior history of Echocardiogram examinations, most                 recent 01/04/2019. Signs/Symptoms:Murmur, Shortness of Breath                 and Dyspnea; Risk Factors:Hypertension, Dyslipidemia and Former  Smoker.  Sonographer:    Roseanna Rainbow RDCS Referring Phys: [id number] JENNIFER YATES  Sonographer Comments: Technically difficult study due to poor echo windows. Patient had recent fall, could not turn. Patient sensitive to pressure from probe in apical region. IMPRESSIONS  1. Left ventricular ejection fraction, by estimation, is 70 to 75%. The left ventricle has hyperdynamic function. The left ventricle has no regional wall motion abnormalities. There is mild left ventricular hypertrophy. Left ventricular diastolic parameters are consistent with Grade I diastolic dysfunction (impaired relaxation).  2. Right ventricular systolic function is normal. The right ventricular size is normal.  3. The mitral valve is normal in structure. No evidence of mitral valve regurgitation. No evidence of mitral stenosis.  4. The aortic valve  is tricuspid. Aortic valve regurgitation is mild. Mild aortic valve stenosis. Aortic valve area, by VTI measures 1.91 cm. Aortic valve mean gradient measures 10.0 mmHg. Aortic valve Vmax measures 2.12 m/s.  5. The inferior vena cava is normal in size with greater than 50% respiratory variability, suggesting right atrial pressure of 3 mmHg. FINDINGS  Left Ventricle: Left ventricular ejection fraction, by estimation, is 70 to 75%. The left ventricle has hyperdynamic function. The left ventricle has no regional wall motion abnormalities. The left ventricular internal cavity size was normal in size. There is mild left ventricular hypertrophy. Left ventricular diastolic parameters are consistent with Grade I diastolic dysfunction (impaired relaxation). Right Ventricle: The right ventricular size is normal. No increase in right ventricular wall thickness. Right ventricular systolic function is normal. Left Atrium: Left atrial size was normal in size. Right Atrium: Right atrial size was normal in size. Pericardium: There is no evidence of pericardial effusion. Mitral Valve: The mitral valve is normal in structure. Mild mitral annular calcification. No evidence of mitral valve regurgitation. No evidence of mitral valve stenosis. Tricuspid Valve: The tricuspid valve is normal in structure. Tricuspid valve regurgitation is not demonstrated. No evidence of tricuspid stenosis. Aortic Valve: The aortic valve is tricuspid. Aortic valve regurgitation is mild. Aortic regurgitation PHT measures 393 msec. Mild aortic stenosis is present. Aortic valve mean gradient measures 10.0 mmHg. Aortic valve peak gradient measures 18.0 mmHg. Aortic valve area, by VTI measures 1.91 cm. Pulmonic Valve: The pulmonic valve was normal in structure. Pulmonic valve regurgitation is not visualized. No evidence of pulmonic stenosis. Aorta: The aortic root is normal in size and structure. Venous: The inferior vena cava is normal in size with greater  than 50% respiratory variability, suggesting right atrial pressure of 3 mmHg. IAS/Shunts: No atrial level shunt detected by color flow Doppler.  LEFT VENTRICLE PLAX 2D LVIDd:         3.40 cm     Diastology LVIDs:         2.00 cm     LV e' medial:    6.96 cm/s LV PW:         1.10 cm     LV E/e' medial:  14.2 LV IVS:        1.30 cm     LV e' lateral:   4.90 cm/s LVOT diam:     1.90 cm     LV E/e' lateral: 20.1 LV SV:         93 LV SV Index:   54 LVOT Area:     2.84 cm  LV Volumes (MOD) LV vol d, MOD A2C: 44.9 ml LV vol d, MOD A4C: 56.2 ml LV vol s, MOD A2C: 9.0 ml LV vol s, MOD A4C: 13.8  ml LV SV MOD A2C:     35.9 ml LV SV MOD A4C:     56.2 ml LV SV MOD BP:      39.8 ml RIGHT VENTRICLE             IVC RV S prime:     20.00 cm/s  IVC diam: 1.90 cm TAPSE (M-mode): 3.2 cm LEFT ATRIUM             Index LA diam:        4.80 cm 2.77 cm/m LA Vol (A2C):   42.8 ml 24.67 ml/m LA Vol (A4C):   36.8 ml 21.21 ml/m LA Biplane Vol: 41.4 ml 23.86 ml/m  AORTIC VALVE AV Area (Vmax):    2.55 cm AV Area (Vmean):   2.25 cm AV Area (VTI):     1.91 cm AV Vmax:           212.00 cm/s AV Vmean:          150.000 cm/s AV VTI:            0.486 m AV Peak Grad:      18.0 mmHg AV Mean Grad:      10.0 mmHg LVOT Vmax:         191.00 cm/s LVOT Vmean:        119.000 cm/s LVOT VTI:          0.328 m LVOT/AV VTI ratio: 0.67 AI PHT:            393 msec  AORTA Ao Root diam: 3.10 cm Ao Asc diam:  3.60 cm MITRAL VALVE MV Area (PHT): 3.72 cm     SHUNTS MV Decel Time: 204 msec     Systemic VTI:  0.33 m MV E velocity: 98.50 cm/s   Systemic Diam: 1.90 cm MV A velocity: 104.00 cm/s MV E/A ratio:  0.95 Candee Furbish MD Electronically signed by Candee Furbish MD Signature Date/Time: 04/06/2020/3:25:34 PM    Final    CT Maxillofacial Wo Contrast  Result Date: 04/06/2020 CLINICAL DATA:  Syncopal episode. EXAM: CT MAXILLOFACIAL WITHOUT CONTRAST TECHNIQUE: Multidetector CT imaging of the maxillofacial structures was performed. Multiplanar CT image reconstructions  were also generated. COMPARISON:  None. FINDINGS: Osseous: Bilateral nondisplaced nasal bone fractures of indeterminate age are seen. Orbits: Negative. No traumatic or inflammatory finding. Sinuses: There is marked severity sphenoid sinus mucosal thickening. Soft tissues: Mild to moderate severity frontal scalp soft tissue swelling is seen, along the midline. Limited intracranial: No significant or unexpected finding. IMPRESSION: 1. Bilateral nondisplaced nasal bone fractures. 2. Mild to moderate severity frontal scalp soft tissue swelling. 3. Marked severity sphenoid sinus mucosal thickening. Electronically Signed   By: Virgina Norfolk M.D.   On: 04/06/2020 02:43   DG ESOPHAGUS W SINGLE CM (SOL OR THIN BA)  Result Date: 04/17/2020 CLINICAL DATA:  Possible distal esophageal perforation near the GE junction. EXAM: ESOPHOGRAM/BARIUM SWALLOW TECHNIQUE: Single contrast examination was performed using water-soluble contrast. FLUOROSCOPY TIME:  Fluoroscopy Time:  1.7 minutes Radiation Exposure Index (if provided by the fluoroscopic device): 21.5 mGy Number of Acquired Spot Images: 0 COMPARISON:  CT abdomen pelvis from same day. Esophagram dated May 09, 2016. FINDINGS: Normal esophageal course and contour. No obstruction, stricture, or perforation. Unchanged small hiatal hernia. The procedure was terminated after the patient aspirated a small amount of contrast. IMPRESSION: 1. No evidence of esophageal perforation. 2. Positive for aspiration. Consider modified barium swallow study for further evaluation. Electronically Signed   By: Huntley Dec  Derry M.D.   On: 04/17/2020 19:05   Subjective: Feels well. Ate a robust breakfast. No lightheadedness or fainting. No weakness. No palpitations. Urine output appears subjectively normal.  Discharge Exam: Vitals:   04/29/20 2340 04/30/20 0537  BP: 136/72 (!) 150/76  Pulse: 87 86  Resp: 20 19  Temp: 99.3 F (37.4 C) 98.5 F (36.9 C)  SpO2: 97% 96%   General: Pt is  alert, awake, not in acute distress Cardiovascular: RRR, S1/S2 +, no rubs, no gallops Respiratory: CTA bilaterally, no wheezing, no rhonchi Abdominal: Soft, NT, ND, bowel sounds + Extremities: No edema, no cyanosis  Labs: BNP (last 3 results) No results for input(s): BNP in the last 8760 hours. Basic Metabolic Panel: Recent Labs  Lab 04/24/20 0058 04/28/20 1641 04/29/20 0521 04/30/20 0340  NA 127* 128* 127* 130*  K 4.3 5.2* 4.5 4.6  CL 97* 97* 98 100  CO2 25 20* 22 24  GLUCOSE 109* 160* 119* 97  BUN 23 30* 33* 29*  CREATININE 0.97 1.16* 1.37* 1.06*  CALCIUM 8.3* 9.0 8.9 8.7*  MG 1.6*  --   --   --   PHOS 3.1  --   --  2.8   Liver Function Tests: Recent Labs  Lab 04/24/20 0058 04/30/20 0340  AST 23  --   ALT 20  --   ALKPHOS 77  --   BILITOT 0.7  --   PROT 5.8*  --   ALBUMIN 2.2* 2.4*   No results for input(s): LIPASE, AMYLASE in the last 168 hours. No results for input(s): AMMONIA in the last 168 hours. CBC: Recent Labs  Lab 04/24/20 0058 04/28/20 1641 04/29/20 0521  WBC 7.1 12.2* 10.2  NEUTROABS 4.2  --   --   HGB 10.3* 14.6 12.0  HCT 31.0* 43.5 34.9*  MCV 84.9 85.1 83.5  PLT 332 441* 348   Cardiac Enzymes: No results for input(s): CKTOTAL, CKMB, CKMBINDEX, TROPONINI in the last 168 hours. BNP: Invalid input(s): POCBNP CBG: Recent Labs  Lab 04/24/20 0050 04/24/20 0532 04/24/20 1043 04/29/20 0608 04/30/20 0600  GLUCAP 105* 96 124* 114* 90   D-Dimer No results for input(s): DDIMER in the last 72 hours. Hgb A1c No results for input(s): HGBA1C in the last 72 hours. Lipid Profile No results for input(s): CHOL, HDL, LDLCALC, TRIG, CHOLHDL, LDLDIRECT in the last 72 hours. Thyroid function studies No results for input(s): TSH, T4TOTAL, T3FREE, THYROIDAB in the last 72 hours.  Invalid input(s): FREET3 Anemia work up No results for input(s): VITAMINB12, FOLATE, FERRITIN, TIBC, IRON, RETICCTPCT in the last 72 hours. Urinalysis    Component Value  Date/Time   COLORURINE AMBER (A) 04/28/2020 2057   APPEARANCEUR CLOUDY (A) 04/28/2020 2057   LABSPEC 1.017 04/28/2020 2057   LABSPEC 1.010 04/21/2011 1324   PHURINE 5.0 04/28/2020 2057   GLUCOSEU NEGATIVE 04/28/2020 2057   HGBUR NEGATIVE 04/28/2020 2057   BILIRUBINUR NEGATIVE 04/28/2020 2057   BILIRUBINUR Negative 04/21/2011 1324   KETONESUR 5 (A) 04/28/2020 2057   PROTEINUR NEGATIVE 04/28/2020 2057   UROBILINOGEN 0.2 05/18/2011 2014   NITRITE NEGATIVE 04/28/2020 2057   LEUKOCYTESUR TRACE (A) 04/28/2020 2057   LEUKOCYTESUR Moderate 04/21/2011 1324    Microbiology Recent Results (from the past 240 hour(s))  Resp Panel by RT-PCR (Flu A&B, Covid) Nasopharyngeal Swab     Status: None   Collection Time: 04/24/20 10:23 AM   Specimen: Nasopharyngeal Swab; Nasopharyngeal(NP) swabs in vial transport medium  Result Value Ref Range Status   SARS  Coronavirus 2 by RT PCR NEGATIVE NEGATIVE Final    Comment: (NOTE) SARS-CoV-2 target nucleic acids are NOT DETECTED.  The SARS-CoV-2 RNA is generally detectable in upper respiratory specimens during the acute phase of infection. The lowest concentration of SARS-CoV-2 viral copies this assay can detect is 138 copies/mL. A negative result does not preclude SARS-Cov-2 infection and should not be used as the sole basis for treatment or other patient management decisions. A negative result may occur with  improper specimen collection/handling, submission of specimen other than nasopharyngeal swab, presence of viral mutation(s) within the areas targeted by this assay, and inadequate number of viral copies(<138 copies/mL). A negative result must be combined with clinical observations, patient history, and epidemiological information. The expected result is Negative.  Fact Sheet for Patients:  EntrepreneurPulse.com.au  Fact Sheet for Healthcare Providers:  IncredibleEmployment.be  This test is no t yet approved  or cleared by the [place] FDA and  has been authorized for detection and/or diagnosis of SARS-CoV-2 by FDA under an Emergency Use Authorization (EUA). This EUA will remain  in effect (meaning this test can be used) for the duration of the COVID-19 declaration under Section 564(b)(1) of the Act, 21 U.S.C.section 360bbb-3(b)(1), unless the authorization is terminated  or revoked sooner.       Influenza A by PCR NEGATIVE NEGATIVE Final   Influenza B by PCR NEGATIVE NEGATIVE Final    Comment: (NOTE) The Xpert Xpress SARS-CoV-2/FLU/RSV plus assay is intended as an aid in the diagnosis of influenza from Nasopharyngeal swab specimens and should not be used as a sole basis for treatment. Nasal washings and aspirates are unacceptable for Xpert Xpress SARS-CoV-2/FLU/RSV testing.  Fact Sheet for Patients: EntrepreneurPulse.com.au  Fact Sheet for Healthcare Providers: IncredibleEmployment.be  This test is not yet approved or cleared by the [place] FDA and has been authorized for detection and/or diagnosis of SARS-CoV-2 by FDA under an Emergency Use Authorization (EUA). This EUA will remain in effect (meaning this test can be used) for the duration of the COVID-19 declaration under Section 564(b)(1) of the Act, 21 U.S.C. section 360bbb-3(b)(1), unless the authorization is terminated or revoked.  Performed at [facility], [place] [address] [place], [place] [address]   SARS CORONAVIRUS 2 (TAT 6-24 HRS) Nasopharyngeal Nasopharyngeal Swab     Status: None   Collection Time: 04/29/20 12:23 AM   Specimen: Nasopharyngeal Swab  Result Value Ref Range Status   SARS Coronavirus 2 NEGATIVE NEGATIVE Final    Comment: (NOTE) SARS-CoV-2 target nucleic acids are NOT DETECTED.  The SARS-CoV-2 RNA is generally detectable in upper and lower respiratory specimens during the acute phase of infection. Negative results do not preclude SARS-CoV-2  infection, do not rule out co-infections with other pathogens, and should not be used as the sole basis for treatment or other patient management decisions. Negative results must be combined with clinical observations, patient history, and epidemiological information. The expected result is Negative.  Fact Sheet for Patients: SugarRoll.be  Fact Sheet for Healthcare Providers: https://www.woods-mathews.com/  This test is not yet approved or cleared by the [place] FDA and  has been authorized for detection and/or diagnosis of SARS-CoV-2 by FDA under an Emergency Use Authorization (EUA). This EUA will remain  in effect (meaning this test can be used) for the duration of the COVID-19 declaration under Se ction 564(b)(1) of the Act, 21 U.S.C. section 360bbb-3(b)(1), unless the authorization is terminated or revoked sooner.  Performed at [facility], [place]  [address], [place], [place] [address]     Time coordinating discharge: Approximately 40 minutes  Patrecia Pour, MD  [facility] 04/30/2020, 8:51 AM

## 2020-05-01 ENCOUNTER — Non-Acute Institutional Stay (SKILLED_NURSING_FACILITY): Payer: Medicare Other | Admitting: Nurse Practitioner

## 2020-05-01 ENCOUNTER — Encounter: Payer: Self-pay | Admitting: Nurse Practitioner

## 2020-05-01 DIAGNOSIS — K5901 Slow transit constipation: Secondary | ICD-10-CM | POA: Diagnosis not present

## 2020-05-01 DIAGNOSIS — M159 Polyosteoarthritis, unspecified: Secondary | ICD-10-CM | POA: Diagnosis not present

## 2020-05-01 DIAGNOSIS — R55 Syncope and collapse: Secondary | ICD-10-CM

## 2020-05-01 DIAGNOSIS — M949 Disorder of cartilage, unspecified: Secondary | ICD-10-CM

## 2020-05-01 DIAGNOSIS — I10 Essential (primary) hypertension: Secondary | ICD-10-CM

## 2020-05-01 DIAGNOSIS — F5101 Primary insomnia: Secondary | ICD-10-CM | POA: Diagnosis not present

## 2020-05-01 DIAGNOSIS — K219 Gastro-esophageal reflux disease without esophagitis: Secondary | ICD-10-CM

## 2020-05-01 DIAGNOSIS — I951 Orthostatic hypotension: Secondary | ICD-10-CM

## 2020-05-01 DIAGNOSIS — E871 Hypo-osmolality and hyponatremia: Secondary | ICD-10-CM

## 2020-05-01 DIAGNOSIS — C569 Malignant neoplasm of unspecified ovary: Secondary | ICD-10-CM | POA: Diagnosis not present

## 2020-05-01 DIAGNOSIS — M899 Disorder of bone, unspecified: Secondary | ICD-10-CM | POA: Diagnosis not present

## 2020-05-01 NOTE — Assessment & Plan Note (Addendum)
Off Meds. Loose Bp control maybe beneficial to syncope.

## 2020-05-01 NOTE — Progress Notes (Signed)
Location:   SNF [place] Room Number: 4 Place of Service:  SNF (31) Provider: [facility] [name]  Virgie Dad, MD  Patient Care Team: Virgie Dad, MD as PCP - General (Internal Medicine) Donato Heinz, MD as PCP - Cardiology (Cardiology) Brien Few, MD as Consulting Physician (Obstetrics and Gynecology) Marti Sleigh, MD as Consulting Physician (Gynecology) Nancy Marus, MD as Consulting Physician (Gynecologic Oncology)  Extended Emergency Contact Information Primary Emergency Contact: [facility] Address: [address]          [place], [place] [address] [name] of [place] Phone: [phone] Mobile Phone: [phone] Relation: Daughter  Code Status:  DNR Goals of care: Advanced Directive information Advanced Directives 05/02/2020  Does Patient Have a Medical Advance Directive? Yes  Type of Advance Directive Living will;Healthcare Power of Attorney  Does patient want to make changes to medical advance directive? -  Copy of [place] in Chart? Yes - validated most recent copy scanned in chart (See row information)  Would patient like information on creating a medical advance directive? -  Pre-existing out of facility DNR order (yellow form or pink MOST form) -     Chief Complaint  Patient presents with  . Acute Visit    Medication review    HPI:  Pt is a 87 y.o. female seen today for an acute visit for medication review following hospital stay.   Hospitalized:  04/28/20-04/30/20 Hyponatremia,  improved Na 128-130, f/u BMP, stopped Furosemide-may be prn per nephrology,  started NaCl, 1259ml fluid restriction. May consider SIADH workup, Nephrology consultation in hospital and may referral as outpatient.   Syncope: outpatient cardiac monitor. Orthostasis vs micturition vs dehydration.   04/16/20-04/24/20 Syncope, noted orthostatic hypotension, treated with IVF, Cardiology arranged outpatient cardiac monitor.  Suspected SIADH, placed on 1200 ml/day fluid restrictin.   04/06/20-04/11/20 Syncope with fall from commode with facial abrasion, suspected orthostatic syncope vs Ambien effect, LVEF 70-75%, mild AR and AS.   03/02/20 presyncope.  Hx of ovarian CA s/p TAH/BSO 03/2011, in remission.  Constipation takes prn Colace.              HTN, off meds.              OP, takes Ca, Vit D, Prolia             GERD, takes Pantoprazole.              Insomnia, off Ambien.      Past Medical History:  Diagnosis Date  . Anemia associated with acute blood loss 04/03/2011  . Arthritis    knees   . Ascites   . Blood transfusion    hx of 2011   . Complication of anesthesia    Sodium drops per pt   . Cystitis   . DIARRHEA, ANTIBIOTIC ASSOCIATED 05/31/2009   Qualifier: Diagnosis of  By: Tommy Medal MD, Roderic Scarce    . GERD (gastroesophageal reflux disease)   . H/O hiatal hernia   . Hyperlipidemia   . Hypertension   . Hyponatremia   . Neutropenia with fever ([place]) 05/18/2011  . OSTEOMYELITIS, CHRONIC, LOWER LEG 04/23/2009   Qualifier: Diagnosis of  By: Johnnye Sima MD, Dellis Filbert    . Osteopenia   . OSTEOPOROSIS 04/23/2009   Qualifier: Diagnosis of  By: Johnnye Sima MD, Dellis Filbert    . Ovarian cancer ([place]) 01/25/2011  . Pneumonia    hx of   . Postmenopausal atrophic vaginitis   . PROSTHETIC JOINT COMPLICATION 7/34/2876  Qualifier: Diagnosis of  By: Tommy Medal MD, Roderic Scarce    . Renal disorder    Decreased kidney function  . Staph infection 2010   after knee replacement  . Urinary frequency   . Vulvitis    Past Surgical History:  Procedure Laterality Date  . ABDOMINAL HYSTERECTOMY  04/01/2011   Procedure: HYSTERECTOMY ABDOMINAL;  Surgeon: Alvino Chapel, MD;  Location: [facility]S;  Service: Gynecology;  Laterality: N/A;  . APPENDECTOMY    . JOINT REPLACEMENT     R knee in 2008, 5 operations on L knee  . LAPAROTOMY  04/01/2011   Procedure: EXPLORATORY LAPAROTOMY;  Surgeon: Alvino Chapel, MD;  Location: [facility];   Service: Gynecology;  Laterality: N/A;  . OTHER SURGICAL HISTORY     hx of C section 1966  . SALPINGOOPHORECTOMY  04/01/2011   Procedure: SALPINGO OOPHERECTOMY;  Surgeon: Alvino Chapel, MD;  Location: [facility];  Service: Gynecology;  Laterality: Bilateral;  . TONSILLECTOMY      Allergies  Allergen Reactions  . Morphine And Related Anaphylaxis and Other (See Comments)    Given after knee replacement and code blue occurred  . Penicillins Hives, Itching and Other (See Comments)    Syncope, also  . Nsaids Other (See Comments)    Because of an abnormal kidney test result  . Shellfish Allergy Nausea And Vomiting and Other (See Comments)    Pt has shellfish allergy only.  Has had IV contrast x 2 and did fine.    Allergies as of 05/01/2020      Reactions   Morphine And Related Anaphylaxis, Other (See Comments)   Given after knee replacement and code blue occurred   Penicillins Hives, Itching, Other (See Comments)   Syncope, also   Nsaids Other (See Comments)   Because of an abnormal kidney test result   Shellfish Allergy Nausea And Vomiting, Other (See Comments)   Pt has shellfish allergy only.  Has had IV contrast x 2 and did fine.      Medication List       Accurate as of May 01, 2020 11:59 PM. If you have any questions, ask your nurse or doctor.        acetaminophen 500 MG tablet Commonly known as: TYLENOL Take 2 tablets (1,000 mg total) by mouth 3 (three) times daily.   azelastine 0.1 % nasal spray Commonly known as: ASTELIN Place 1 spray into both nostrils daily as needed for rhinitis or allergies.   B-COMPLEX/FOLIC ACID/VITAMIN C PO Take 1 tablet by mouth daily with breakfast.   calcium carbonate 750 MG chewable tablet Commonly known as: TUMS EX Chew 750 mg by mouth daily as needed for heartburn.   diclofenac Sodium 1 % Gel Commonly known as: VOLTAREN Apply 2 g topically 4 (four) times daily.   docusate sodium 100 MG capsule Commonly known as:  COLACE Take 100 mg by mouth daily as needed for mild constipation.   magnesium oxide 400 (241.3 Mg) MG tablet Commonly known as: MAG-OX Take 1 tablet (400 mg total) by mouth daily for 14 days.   meclizine 12.5 MG tablet Commonly known as: ANTIVERT Take 1 tablet (12.5 mg total) by mouth 2 (two) times daily as needed for dizziness.   melatonin 3 MG Tabs tablet Take 1 tablet (3 mg total) by mouth at bedtime.   pantoprazole 40 MG tablet Commonly known as: PROTONIX Take 1 tablet (40 mg total) by mouth 2 (two) times daily.   Restasis 0.05 % ophthalmic  emulsion Generic drug: cycloSPORINE Place 1 drop into both eyes in the morning and at bedtime.   sodium chloride 1 g tablet Take 1 tablet (1 g total) by mouth 3 (three) times daily with meals.   Vitamin D3 50 MCG (2000 UT) Tabs Take 2,000 Units by mouth in the morning.   zinc oxide 20 % ointment Apply 1 application topically as needed (for redness- to be applied to buttocks/peri area and after every incontinent episode).       Review of Systems  Constitutional: Negative for activity change, appetite change and fever.  HENT: Negative for congestion, trouble swallowing and voice change.   Eyes: Negative for visual disturbance.  Respiratory: Negative for cough and shortness of breath.   Cardiovascular: Negative for chest pain, palpitations and leg swelling.  Gastrointestinal: Negative for abdominal pain and constipation.  Genitourinary: Negative for dysuria, frequency and urgency.  Musculoskeletal: Positive for arthralgias, back pain and gait problem.  Skin: Negative for color change.  Neurological: Negative for tremors, speech difficulty, weakness, light-headedness and headaches.  Hematological: Bruises/bleeds easily.       Right lower back  Psychiatric/Behavioral: Negative for behavioral problems, confusion and sleep disturbance. The patient is not nervous/anxious.     Immunization History  Administered Date(s) Administered   . Influenza Split 11/13/2012, 12/13/2012, 11/30/2013, 10/09/2014, 11/24/2018  . Influenza, High Dose Seasonal PF 12/01/2013, 10/04/2014, 10/12/2015, 11/06/2016, 11/12/2017  . Influenza,inj,Quad PF,6+ Mos 10/24/2010  . Influenza-Unspecified 10/24/2010  . Moderna Sars-Covid-2 Vaccination 02/14/2019, 03/14/2019, 12/26/2019  . Pneumococcal Conjugate-13 09/28/2013  . Pneumococcal Polysaccharide-23 03/05/2012  . Td 08/27/2000  . Tdap 08/27/2000, 10/16/2010  . Zoster 01/11/2013, 03/31/2020   Pertinent  Health Maintenance Due  Topic Date Due  . INFLUENZA VACCINE  Completed  . DEXA SCAN  Completed  . PNA vac Low Risk Adult  Completed   Fall Risk  03/02/2014  Falls in the past year? No   Functional Status Survey:    Vitals:   05/01/20 1149  BP: (!) 138/98  Pulse: 84  Resp: 17  Temp: (!) 97 F (36.1 C)  SpO2: 97%  Weight: 160 lb (72.6 kg)  Height: 5\' 3"  (1.6 m)   Body mass index is 28.34 kg/m. Physical Exam Vitals and nursing note reviewed.  Constitutional:      General: She is not in acute distress.    Appearance: Normal appearance. She is not ill-appearing, toxic-appearing or diaphoretic.     Comments: Over weight   HENT:     Head: Normocephalic and atraumatic.     Mouth/Throat:     Mouth: Mucous membranes are moist.  Eyes:     Extraocular Movements: Extraocular movements intact.     Conjunctiva/sclera: Conjunctivae normal.     Pupils: Pupils are equal, round, and reactive to light.  Cardiovascular:     Rate and Rhythm: Normal rate and regular rhythm.     Heart sounds: No murmur heard.   Pulmonary:     Effort: Pulmonary effort is normal.     Breath sounds: No rales.  Abdominal:     General: Bowel sounds are normal.     Palpations: Abdomen is soft.     Tenderness: There is no abdominal tenderness.     Hernia: A hernia is present.     Comments: Incision hernia  Musculoskeletal:     Cervical back: Normal range of motion and neck supple.     Right lower leg: No  edema.     Left lower leg: No edema.  Skin:  General: Skin is warm and dry.     Findings: Bruising present.     Comments: Right lower back small bruise.   Neurological:     General: No focal deficit present.     Mental Status: She is alert and oriented to person, place, and time. Mental status is at baseline.     Motor: No weakness.     Coordination: Coordination normal.     Gait: Gait abnormal.  Psychiatric:        Mood and Affect: Mood normal.        Behavior: Behavior normal.        Thought Content: Thought content normal.        Judgment: Judgment normal.     Labs reviewed: Recent Labs    04/22/20 0109 04/23/20 0652 04/24/20 0058 04/28/20 1641 04/29/20 0521 04/30/20 0340  NA 126* 128* 127* 128* 127* 130*  K 4.1 4.2 4.3 5.2* 4.5 4.6  CL 98 96* 97* 97* 98 100  CO2 22 25 25  20* 22 24  GLUCOSE 111* 142* 109* 160* 119* 97  BUN 16 17 23  30* 33* 29*  CREATININE 0.83 0.97 0.97 1.16* 1.37* 1.06*  CALCIUM 8.4* 8.6* 8.3* 9.0 8.9 8.7*  MG 1.6* 1.6* 1.6*  --   --   --   PHOS 2.8 2.6 3.1  --   --  2.8   Recent Labs    04/22/20 0109 04/23/20 0652 04/24/20 0058 04/30/20 0340  AST 22 33 23  --   ALT 16 25 20   --   ALKPHOS 75 103 77  --   BILITOT 1.0 0.9 0.7  --   PROT 6.5 6.9 5.8*  --   ALBUMIN 2.5* 2.5* 2.2* 2.4*   Recent Labs    04/22/20 0109 04/23/20 0652 04/24/20 0058 04/28/20 1641 04/29/20 0521  WBC 8.4 6.7 7.1 12.2* 10.2  NEUTROABS 5.6 4.4 4.2  --   --   HGB 10.8* 11.9* 10.3* 14.6 12.0  HCT 32.5* 34.9* 31.0* 43.5 34.9*  MCV 84.9 83.9 84.9 85.1 83.5  PLT 322 336 332 441* 348   Lab Results  Component Value Date   TSH 2.357 04/06/2020   No results found for: HGBA1C No results found for: CHOL, HDL, LDLCALC, LDLDIRECT, TRIG, CHOLHDL  Significant Diagnostic Results in last 30 days:  CT ABDOMEN PELVIS WO CONTRAST  Addendum Date: 04/17/2020   ADDENDUM REPORT: 04/17/2020 16:01 ADDENDUM: Findings should include old healed right ninth and tenth rib  fractures. Electronically Signed   By: Iven Finn M.D.   On: 04/17/2020 16:01   Result Date: 04/17/2020 CLINICAL DATA:  Nonlocalized acute abdominal pain. EXAM: CT ABDOMEN AND PELVIS WITHOUT CONTRAST TECHNIQUE: Multidetector CT imaging of the abdomen and pelvis was performed following the standard protocol without IV contrast. COMPARISON:  CT angiography chest 04/16/2020, CT abdomen pelvis 03/05/2011 FINDINGS: Lower chest: Persistent trace right pleural effusion. Bilateral lower lobe subsegmental atelectasis. At least small volume hiatal hernia with persistent distal esophageal thickening. Medial left base atelectasis (6:60); however, a component of trace inferior pneumomediastinum cannot be excluded (3: 4-11). Hepatobiliary: No focal liver abnormality. The gallbladder is contracted. Punctate calcific density within the gallbladder lumen likely represents a gallstone. No gallbladder wall thickening or pericholecystic fluid. No biliary dilatation. Pancreas: No focal lesion. Normal pancreatic contour. No surrounding inflammatory changes. No main pancreatic ductal dilatation. Spleen: Normal in size without focal abnormality. Adrenals/Urinary Tract: No adrenal nodule bilaterally. Punctate calcific densities within bilateral kidneys. No hydronephrosis and no contour-deforming renal  mass. No ureterolithiasis or hydroureter. Fluid fluid level within the urinary bladder likely related to previously administered intravenous contrast for CT angiography chest 04/16/2020. Stomach/Bowel: Stomach is within normal limits. No evidence of bowel wall thickening or dilatation. Diffuse colonic diverticulosis. No pneumatosis. The appendix not definitely identified. Vascular/Lymphatic: Several punctate phleboliths are noted within the pelvis. Focal dilatation of the infrarenal abdominal aorta measuring up to 2.8 cm in diameter with no definite abdominal aorta or iliac aneurysm. Moderate to severe atherosclerotic plaque of the aorta  and its branches. No abdominal, pelvic, or inguinal lymphadenopathy. Reproductive: Status post hysterectomy. No adnexal masses. Other: No intraperitoneal free fluid. No intraperitoneal free gas. No organized fluid collection. Musculoskeletal: Two large consecutive anterior abdominal wall hernias with the likely paraumbilical hernia containing a long segment of the mid to distal transverse colon with an abdominal defect of 5.8 x 4.5 cm. The inferior-most hernia likely a infraumbilical hernia contains several loops of small bowel and demonstrates a abdominal defect of approximately 5.8 x 3.7 cm. Grade 1 anterolisthesis of L4 on L5. Multilevel facet arthropathy. Visualized lower thoracic spine demonstrates multilevel osteophyte formation. No suspicious lytic or blastic osseous lesions. No acute displaced fracture. IMPRESSION: 1. Question trace inferior pneumomediastinum. In the setting of a small hiatal hernia and distal esophageal thickening, a small or contained esophageal perforation cannot be excluded. 2. Two large consecutive anterior abdominal wall hernias, one of which is likely paraumbilical, containing small and large bowel. No definite findings of ischemia or associated bowel obstruction with however limited evaluation due to venous noncontrast study. 3. Colonic diverticulosis with no acute diverticulitis. 4. Nonobstructive punctate nephrolithiasis bilaterally. 5. Aortic Atherosclerosis (ICD10-I70.0). These results were called by telephone at the time of interpretation on 04/17/2020 at 3:45 pm to provider [facility] , who verbally acknowledged these results. Electronically Signed: By: Iven Finn M.D. On: 04/17/2020 15:52   DG Ankle Complete Right  Result Date: 04/06/2020 CLINICAL DATA:  Syncopal episode. EXAM: RIGHT ANKLE - COMPLETE 3+ VIEW COMPARISON:  June 09, 2017 FINDINGS: There is no evidence of an acute fracture, dislocation, or joint effusion. A chronic deformity is seen involving the distal  shaft of the right fibula. There is a moderate-sized plantar calcaneal spur. Soft tissues are unremarkable. IMPRESSION: Chronic deformity of the distal right fibula. Electronically Signed   By: Virgina Norfolk M.D.   On: 04/06/2020 02:45   CT HEAD WO CONTRAST  Result Date: 04/06/2020 CLINICAL DATA:  Syncopal episode. EXAM: CT HEAD WITHOUT CONTRAST TECHNIQUE: Contiguous axial images were obtained from the base of the skull through the vertex without intravenous contrast. COMPARISON:  March 02, 2020 FINDINGS: Brain: There is mild cerebral atrophy with widening of the extra-axial spaces and ventricular dilatation. There are areas of decreased attenuation within the white matter tracts of the supratentorial brain, consistent with microvascular disease changes. A stable 1 cm para falcine calcified right occipital lobe meningioma is noted. Vascular: No hyperdense vessels are identified. Skull: Nondisplaced bilateral nasal bone fractures of indeterminate age are seen. Sinuses/Orbits: Marked severity sphenoid sinus mucosal thickening is present. Other: Mild to moderate severity frontal scalp soft tissue swelling is noted along the midline. IMPRESSION: 1. Mild to moderate severity frontal scalp soft tissue swelling without evidence of an acute fracture or acute intracranial abnormality. 2. Stable 1 cm para falcine calcified right occipital lobe meningioma. 3. Bilateral nondisplaced nasal bone fractures of indeterminate age. Electronically Signed   By: Virgina Norfolk M.D.   On: 04/06/2020 02:42   CT Angio Chest PE  W and/or Wo Contrast  Result Date: 04/16/2020 CLINICAL DATA:  Chest pain, history of prior fall, initial encounter EXAM: CT ANGIOGRAPHY CHEST WITH CONTRAST TECHNIQUE: Multidetector CT imaging of the chest was performed using the standard protocol during bolus administration of intravenous contrast. Multiplanar CT image reconstructions and MIPs were obtained to evaluate the vascular anatomy. CONTRAST:   47mL OMNIPAQUE IOHEXOL 350 MG/ML SOLN COMPARISON:  Chest x-ray from earlier in the same day. FINDINGS: Cardiovascular: Thoracic aorta demonstrates atherosclerotic calcifications without aneurysmal dilatation. The degree of aortic enhancement is not sufficient to evaluate for dissection. No cardiac enlargement is seen. Diffuse coronary calcifications are noted. The pulmonary artery shows a normal branching pattern. No filling defect to suggest pulmonary embolism is noted. Mediastinum/Nodes: Thoracic inlet is within normal limits. No sizable hilar or mediastinal adenopathy is noted. Esophageal thickening is noted distally likely related to reflux. Hiatal hernia is seen. Lungs/Pleura: Mild bibasilar atelectatic changes are seen. Small right pleural effusion is noted. No sizable infiltrate is seen. No sizable parenchymal nodules are noted. Upper Abdomen: Visualized upper abdomen is otherwise within normal limits. Musculoskeletal: Degenerative changes of the thoracic spine are seen. No acute rib abnormality is seen. Review of the MIP images confirms the above findings. IMPRESSION: No evidence of pulmonary emboli. Small right pleural effusion and bibasilar atelectatic changes. Hiatal hernia with distal esophageal thickening likely related to reflux. Aortic Atherosclerosis (ICD10-I70.0). Electronically Signed   By: Inez Catalina M.D.   On: 04/16/2020 19:55   DG CHEST PORT 1 VIEW  Result Date: 04/18/2020 CLINICAL DATA:  History of prior fall.  History of chest pain. EXAM: PORTABLE CHEST 1 VIEW COMPARISON:  CT 04/16/2020.  Chest x-ray 04/16/2020. FINDINGS: Mediastinum hilar structures normal. Heart size normal. No focal infiltrate. No pleural effusion or pneumothorax. Prominent skin folds noted. Degenerative change thoracic spine. IMPRESSION: No acute cardiopulmonary disease.  Chest is stable from prior exam. Electronically Signed   By: Marcello Moores  Register   On: 04/18/2020 07:39   DG Chest Portable 1 View  Result Date:  04/16/2020 CLINICAL DATA:  Recent fall, chest pain EXAM: PORTABLE CHEST 1 VIEW COMPARISON:  08/04/2019 FINDINGS: Single frontal view of the chest demonstrates an unremarkable cardiac silhouette. No airspace disease, effusion, or pneumothorax. No acute bony abnormalities. IMPRESSION: 1. No acute intrathoracic process. Electronically Signed   By: Randa Ngo M.D.   On: 04/16/2020 18:49   DG Foot 2 Views Right  Result Date: 04/18/2020 CLINICAL DATA:  Pain following recent fall EXAM: RIGHT FOOT - 2 VIEW COMPARISON:  March 19, 2020. FINDINGS: Frontal and lateral views obtained. Subtle transverse lucencies in the proximal third and fourth metatarsals are stable, concerning for nondisplaced fractures. Evidence of old trauma with remodeling distal fourth metatarsal. No evidence of new fracture compared to prior study. No dislocation. There is underlying osteoporosis. No appreciable joint space narrowing. There is an inferior calcaneal spur. There is mild spurring in the dorsal midfoot. Foci of arterial vascular calcification noted. IMPRESSION: Persistent subtle transverse lucencies in the proximal third and proximal fourth metatarsals, unchanged from prior study and likely representing subtle nondisplaced fractures in these areas. Old healed fracture distal fourth metatarsal with remodeling. No acute fracture compared to prior study. No dislocation. Mild spurring dorsal midfoot. Inferior calcaneal spur. Foci of arterial vascular atherosclerotic calcification noted. Electronically Signed   By: Lowella Grip III M.D.   On: 04/18/2020 11:16   DG Foot Complete Right  Result Date: 04/08/2020 CLINICAL DATA:  Lateral foot pain and swelling. EXAM: RIGHT FOOT COMPLETE -  3+ VIEW COMPARISON:  None. FINDINGS: Old fourth metatarsal fracture. Mild diffuse decreased bone mineralization. Mild degenerate changes over the midfoot and hindfoot. Small inferior calcaneal spur. Subtle transverse lucencies at the base of the third  and fourth metatarsals on the oblique view likely within normal although could not exclude subtle nondisplaced fractures. IMPRESSION: Subtle transverse lucencies at the base of the third and fourth metatarsals on the oblique view likely within normal, although could represent subtle nondisplaced fractures. Consider follow-up radiograph 7-10 days. Electronically Signed   By: Marin Olp M.D.   On: 04/08/2020 11:12   ECHOCARDIOGRAM COMPLETE  Result Date: 04/06/2020    ECHOCARDIOGRAM REPORT   Patient Name:   YARIMAR LAVIS [facility] Date of Exam: 04/06/2020 Medical Rec #:  [id number]        Height:       63.0 in Accession #:    [id number]       Weight:       155.0 lb Date of Birth:  February 03, 1930        BSA:          1.735 m Patient Age:    55 years         BP:           140/64 mmHg Patient Gender: F                HR:           84 bpm. Exam Location:  Inpatient Procedure: 2D Echo, Cardiac Doppler and Color Doppler Indications:    R55 Syncope  History:        Patient has prior history of Echocardiogram examinations, most                 recent 01/04/2019. Signs/Symptoms:Murmur, Shortness of Breath                 and Dyspnea; Risk Factors:Hypertension, Dyslipidemia and Former                 Smoker.  Sonographer:    Roseanna Rainbow RDCS Referring Phys: [id number] JENNIFER YATES  Sonographer Comments: Technically difficult study due to poor echo windows. Patient had recent fall, could not turn. Patient sensitive to pressure from probe in apical region. IMPRESSIONS  1. Left ventricular ejection fraction, by estimation, is 70 to 75%. The left ventricle has hyperdynamic function. The left ventricle has no regional wall motion abnormalities. There is mild left ventricular hypertrophy. Left ventricular diastolic parameters are consistent with Grade I diastolic dysfunction (impaired relaxation).  2. Right ventricular systolic function is normal. The right ventricular size is normal.  3. The mitral valve is normal in structure. No evidence of  mitral valve regurgitation. No evidence of mitral stenosis.  4. The aortic valve is tricuspid. Aortic valve regurgitation is mild. Mild aortic valve stenosis. Aortic valve area, by VTI measures 1.91 cm. Aortic valve mean gradient measures 10.0 mmHg. Aortic valve Vmax measures 2.12 m/s.  5. The inferior vena cava is normal in size with greater than 50% respiratory variability, suggesting right atrial pressure of 3 mmHg. FINDINGS  Left Ventricle: Left ventricular ejection fraction, by estimation, is 70 to 75%. The left ventricle has hyperdynamic function. The left ventricle has no regional wall motion abnormalities. The left ventricular internal cavity size was normal in size. There is mild left ventricular hypertrophy. Left ventricular diastolic parameters are consistent with Grade I diastolic dysfunction (impaired relaxation). Right Ventricle: The right ventricular size is normal. No increase in right ventricular  wall thickness. Right ventricular systolic function is normal. Left Atrium: Left atrial size was normal in size. Right Atrium: Right atrial size was normal in size. Pericardium: There is no evidence of pericardial effusion. Mitral Valve: The mitral valve is normal in structure. Mild mitral annular calcification. No evidence of mitral valve regurgitation. No evidence of mitral valve stenosis. Tricuspid Valve: The tricuspid valve is normal in structure. Tricuspid valve regurgitation is not demonstrated. No evidence of tricuspid stenosis. Aortic Valve: The aortic valve is tricuspid. Aortic valve regurgitation is mild. Aortic regurgitation PHT measures 393 msec. Mild aortic stenosis is present. Aortic valve mean gradient measures 10.0 mmHg. Aortic valve peak gradient measures 18.0 mmHg. Aortic valve area, by VTI measures 1.91 cm. Pulmonic Valve: The pulmonic valve was normal in structure. Pulmonic valve regurgitation is not visualized. No evidence of pulmonic stenosis. Aorta: The aortic root is normal in size  and structure. Venous: The inferior vena cava is normal in size with greater than 50% respiratory variability, suggesting right atrial pressure of 3 mmHg. IAS/Shunts: No atrial level shunt detected by color flow Doppler.  LEFT VENTRICLE PLAX 2D LVIDd:         3.40 cm     Diastology LVIDs:         2.00 cm     LV e' medial:    6.96 cm/s LV PW:         1.10 cm     LV E/e' medial:  14.2 LV IVS:        1.30 cm     LV e' lateral:   4.90 cm/s LVOT diam:     1.90 cm     LV E/e' lateral: 20.1 LV SV:         93 LV SV Index:   54 LVOT Area:     2.84 cm  LV Volumes (MOD) LV vol d, MOD A2C: 44.9 ml LV vol d, MOD A4C: 56.2 ml LV vol s, MOD A2C: 9.0 ml LV vol s, MOD A4C: 13.8 ml LV SV MOD A2C:     35.9 ml LV SV MOD A4C:     56.2 ml LV SV MOD BP:      39.8 ml RIGHT VENTRICLE             IVC RV S prime:     20.00 cm/s  IVC diam: 1.90 cm TAPSE (M-mode): 3.2 cm LEFT ATRIUM             Index LA diam:        4.80 cm 2.77 cm/m LA Vol (A2C):   42.8 ml 24.67 ml/m LA Vol (A4C):   36.8 ml 21.21 ml/m LA Biplane Vol: 41.4 ml 23.86 ml/m  AORTIC VALVE AV Area (Vmax):    2.55 cm AV Area (Vmean):   2.25 cm AV Area (VTI):     1.91 cm AV Vmax:           212.00 cm/s AV Vmean:          150.000 cm/s AV VTI:            0.486 m AV Peak Grad:      18.0 mmHg AV Mean Grad:      10.0 mmHg LVOT Vmax:         191.00 cm/s LVOT Vmean:        119.000 cm/s LVOT VTI:          0.328 m LVOT/AV VTI ratio: 0.67 AI PHT:  393 msec  AORTA Ao Root diam: 3.10 cm Ao Asc diam:  3.60 cm MITRAL VALVE MV Area (PHT): 3.72 cm     SHUNTS MV Decel Time: 204 msec     Systemic VTI:  0.33 m MV E velocity: 98.50 cm/s   Systemic Diam: 1.90 cm MV A velocity: 104.00 cm/s MV E/A ratio:  0.95 Candee Furbish MD Electronically signed by Candee Furbish MD Signature Date/Time: 04/06/2020/3:25:34 PM    Final    CT Maxillofacial Wo Contrast  Result Date: 04/06/2020 CLINICAL DATA:  Syncopal episode. EXAM: CT MAXILLOFACIAL WITHOUT CONTRAST TECHNIQUE: Multidetector CT imaging of the  maxillofacial structures was performed. Multiplanar CT image reconstructions were also generated. COMPARISON:  None. FINDINGS: Osseous: Bilateral nondisplaced nasal bone fractures of indeterminate age are seen. Orbits: Negative. No traumatic or inflammatory finding. Sinuses: There is marked severity sphenoid sinus mucosal thickening. Soft tissues: Mild to moderate severity frontal scalp soft tissue swelling is seen, along the midline. Limited intracranial: No significant or unexpected finding. IMPRESSION: 1. Bilateral nondisplaced nasal bone fractures. 2. Mild to moderate severity frontal scalp soft tissue swelling. 3. Marked severity sphenoid sinus mucosal thickening. Electronically Signed   By: Virgina Norfolk M.D.   On: 04/06/2020 02:43   DG ESOPHAGUS W SINGLE CM (SOL OR THIN BA)  Result Date: 04/17/2020 CLINICAL DATA:  Possible distal esophageal perforation near the GE junction. EXAM: ESOPHOGRAM/BARIUM SWALLOW TECHNIQUE: Single contrast examination was performed using water-soluble contrast. FLUOROSCOPY TIME:  Fluoroscopy Time:  1.7 minutes Radiation Exposure Index (if provided by the fluoroscopic device): 21.5 mGy Number of Acquired Spot Images: 0 COMPARISON:  CT abdomen pelvis from same day. Esophagram dated May 09, 2016. FINDINGS: Normal esophageal course and contour. No obstruction, stricture, or perforation. Unchanged small hiatal hernia. The procedure was terminated after the patient aspirated a small amount of contrast. IMPRESSION: 1. No evidence of esophageal perforation. 2. Positive for aspiration. Consider modified barium swallow study for further evaluation. Electronically Signed   By: Titus Dubin M.D.   On: 04/17/2020 19:05    Assessment/Plan Hyponatremia Hyponatremia,  improved Na 128-130, f/u BMP, stopped Furosemide-may be prn per nephrology,  started NaCl, 1243ml fluid restriction. May consider SIADH workup, Nephrology consultation in hospital and may referral as outpatient.  BMP  05/03/20  Syncope and collapse  04/28/20-04/30/20 Hyponatremia,  improved Na 128-130, f/u BMP, stopped Furosemide-may be prn per nephrology,  started NaCl, 1266ml fluid restriction. May consider SIADH workup, Nephrology consultation in hospital and may referral as outpatient.   Syncope: outpatient cardiac monitor. Orthostasis vs micturition vs dehydration.   04/16/20-04/24/20 Syncope, noted orthostatic hypotension, treated with IVF, Cardiology arranged outpatient cardiac monitor. Suspected SIADH, placed on 1200 ml/day fluid restrictin.   04/06/20-04/11/20 Syncope with fall from commode with facial abrasion, suspected orthostatic syncope vs Ambien effect, LVEF 70-75%, mild AR and AS.   03/02/20 presyncope.   Ovarian cancer Hx of ovarian CA s/p TAH/BSO 03/2011, in remission.  Osteoarthritis Takes Tylenol.   Slow transit constipation Stable, continue Colace.   Essential hypertension Off Meds. Loose Bp control maybe beneficial to syncope.   Disorder of bone and cartilage OP, takes Ca, Vit D, Prolia  GERD Stable, continue Pantoprazole.   Insomnia Off Ambien in ? Contributory to syncope.   Orthostatic hypotension Noted during hospital stay 04/16/20-04/24/20, 04/06/20-04/11/20, avoid dehydration, diuretics.     Family/ staff Communication: plan of care reviewed with the patient and charge nurse.   Labs/tests ordered:    Time spend 35 minutes.

## 2020-05-01 NOTE — Assessment & Plan Note (Signed)
Off Ambien in ? Contributory to syncope.

## 2020-05-01 NOTE — Assessment & Plan Note (Signed)
Hyponatremia,  improved Na 128-130, f/u BMP, stopped Furosemide-may be prn per nephrology,  started NaCl, 1232ml fluid restriction. May consider SIADH workup, Nephrology consultation in hospital and may referral as outpatient.  BMP 05/03/20

## 2020-05-01 NOTE — Assessment & Plan Note (Signed)
Takes Tylenol.

## 2020-05-01 NOTE — Assessment & Plan Note (Signed)
OP, takes Ca, Vit D, Prolia

## 2020-05-01 NOTE — Assessment & Plan Note (Signed)
Stable, continue Pantoprazole.  

## 2020-05-01 NOTE — Assessment & Plan Note (Addendum)
Noted during hospital stay 04/16/20-04/24/20, 04/06/20-04/11/20, avoid dehydration, diuretics.

## 2020-05-01 NOTE — Assessment & Plan Note (Signed)
Hx of ovarian CA s/p TAH/BSO 03/2011, in remission. 

## 2020-05-01 NOTE — Assessment & Plan Note (Signed)
Stable, continue Colace.  

## 2020-05-01 NOTE — Assessment & Plan Note (Signed)
04/28/20-04/30/20 Hyponatremia,  improved Na 128-130, f/u BMP, stopped Furosemide-may be prn per nephrology,  started NaCl, 1255ml fluid restriction. May consider SIADH workup, Nephrology consultation in hospital and may referral as outpatient.   Syncope: outpatient cardiac monitor. Orthostasis vs micturition vs dehydration.   04/16/20-04/24/20 Syncope, noted orthostatic hypotension, treated with IVF, Cardiology arranged outpatient cardiac monitor. Suspected SIADH, placed on 1200 ml/day fluid restrictin.   04/06/20-04/11/20 Syncope with fall from commode with facial abrasion, suspected orthostatic syncope vs Ambien effect, LVEF 70-75%, mild AR and AS.   03/02/20 presyncope.

## 2020-05-02 ENCOUNTER — Encounter: Payer: Self-pay | Admitting: Internal Medicine

## 2020-05-02 ENCOUNTER — Non-Acute Institutional Stay (SKILLED_NURSING_FACILITY): Payer: Medicare Other | Admitting: Internal Medicine

## 2020-05-02 DIAGNOSIS — E871 Hypo-osmolality and hyponatremia: Secondary | ICD-10-CM | POA: Diagnosis not present

## 2020-05-02 DIAGNOSIS — K219 Gastro-esophageal reflux disease without esophagitis: Secondary | ICD-10-CM

## 2020-05-02 DIAGNOSIS — F5101 Primary insomnia: Secondary | ICD-10-CM | POA: Diagnosis not present

## 2020-05-02 DIAGNOSIS — M159 Polyosteoarthritis, unspecified: Secondary | ICD-10-CM

## 2020-05-02 DIAGNOSIS — I951 Orthostatic hypotension: Secondary | ICD-10-CM

## 2020-05-02 DIAGNOSIS — I10 Essential (primary) hypertension: Secondary | ICD-10-CM | POA: Diagnosis not present

## 2020-05-02 DIAGNOSIS — R42 Dizziness and giddiness: Secondary | ICD-10-CM | POA: Diagnosis not present

## 2020-05-02 NOTE — Progress Notes (Signed)
Provider:  Veleta Miners MD Location:    [facility] Room Number: 4 Place of Service:  SNF [phone])  PCP: Virgie Dad, MD Patient Care Team: Virgie Dad, MD as PCP - General (Internal Medicine) Donato Heinz, MD as PCP - Cardiology (Cardiology) Brien Few, MD as Consulting Physician (Obstetrics and Gynecology) Marti Sleigh, MD as Consulting Physician (Gynecology) Nancy Marus, MD as Consulting Physician (Gynecologic Oncology)  Extended Emergency Contact Information Primary Emergency Contact: [facility] Address: [address]          [place], [place] [address] [name] of [place] Phone: [phone] Mobile Phone: [phone] Relation: Daughter  Code Status: Full Code Goals of Care: Advanced Directive information Advanced Directives 05/02/2020  Does Patient Have a Medical Advance Directive? Yes  Type of Advance Directive Living will;Healthcare Power of Attorney  Does patient want to make changes to medical advance directive? -  Copy of [place] in Chart? Yes - validated most recent copy scanned in chart (See row information)  Would patient like information on creating a medical advance directive? -  Pre-existing out of facility DNR order (yellow form or pink MOST form) -      Chief Complaint  Patient presents with  . Readmit To SNF    Readmission to SNF    HPI: Patient is a 87 y.o. female seen today for admission to SNF   Admitted in the hospital from 3/19-3/21 for hypotension and syncope Also in 3/7-3/16 for Syncope and also from 2/25-  3/2 after having syncope due to orthostatic hypotension  Patient was in SNF bed her daughter complained to the nurse that she is feeling weak.  She wanted her mom to get stat labs.  The staff got the order and were planning to do the labs when the daughter said she wanted her mom to go to the emergency room.  In EMS patient was found to be hypertensive  with systolic blood pressure of 70-80 and had another episode of syncope.  In the hospital she was again seen by nephrology.  They recommended to treat her like SIADH.  With fluid restriction and she was also started on salt tablets For hypotension all her antihypertensives have been stopped.  Patient is supposed to wear TED hoses. For syncope cardiology is planning for monitor.  Patient did not have any acute complaints today.  Still very reluctant to work with therapy as she is worried about syncope.  Appetite is good.  Past Medical History:  Diagnosis Date  . Anemia associated with acute blood loss 04/03/2011  . Arthritis    knees   . Ascites   . Blood transfusion    hx of 2011   . Complication of anesthesia    Sodium drops per pt   . Cystitis   . DIARRHEA, ANTIBIOTIC ASSOCIATED 05/31/2009   Qualifier: Diagnosis of  By: Tommy Medal MD, Roderic Scarce    . GERD (gastroesophageal reflux disease)   . H/O hiatal hernia   . Hyperlipidemia   . Hypertension   . Hyponatremia   . Neutropenia with fever ([place]) 05/18/2011  . OSTEOMYELITIS, CHRONIC, LOWER LEG 04/23/2009   Qualifier: Diagnosis of  By: Johnnye Sima MD, Dellis Filbert    . Osteopenia   . OSTEOPOROSIS 04/23/2009   Qualifier: Diagnosis of  By: Johnnye Sima MD, Dellis Filbert    . Ovarian cancer ([place]) 01/25/2011  . Pneumonia    hx of   . Postmenopausal atrophic vaginitis   . PROSTHETIC JOINT COMPLICATION 0/35/0093  Qualifier: Diagnosis of  By: Tommy Medal MD, Roderic Scarce    . Renal disorder    Decreased kidney function  . Staph infection 2010   after knee replacement  . Urinary frequency   . Vulvitis    Past Surgical History:  Procedure Laterality Date  . ABDOMINAL HYSTERECTOMY  04/01/2011   Procedure: HYSTERECTOMY ABDOMINAL;  Surgeon: Alvino Chapel, MD;  Location: [facility];  Service: Gynecology;  Laterality: N/A;  . APPENDECTOMY    . JOINT REPLACEMENT     R knee in 2008, 5 operations on L knee  . LAPAROTOMY  04/01/2011   Procedure: EXPLORATORY  LAPAROTOMY;  Surgeon: Alvino Chapel, MD;  Location: [facility];  Service: Gynecology;  Laterality: N/A;  . OTHER SURGICAL HISTORY     hx of C section 1966  . SALPINGOOPHORECTOMY  04/01/2011   Procedure: SALPINGO OOPHERECTOMY;  Surgeon: Alvino Chapel, MD;  Location: [facility];  Service: Gynecology;  Laterality: Bilateral;  . TONSILLECTOMY      reports that she quit smoking about 68 years ago. She has never used smokeless tobacco. She reports previous alcohol use. She reports that she does not use drugs. Social History   Socioeconomic History  . Marital status: Widowed    Spouse name: Not on file  . Number of children: Not on file  . Years of education: Not on file  . Highest education level: Not on file  Occupational History  . Not on file  Tobacco Use  . Smoking status: Former Smoker    Quit date: 02/11/1952    Years since quitting: 68.2  . Smokeless tobacco: Never Used  Vaping Use  . Vaping Use: Never used  Substance and Sexual Activity  . Alcohol use: Not Currently  . Drug use: Never  . Sexual activity: Never  Other Topics Concern  . Not on file  Social History Narrative  . Not on file   Social Determinants of Health   Financial Resource Strain: Not on file  Food Insecurity: Not on file  Transportation Needs: Not on file  Physical Activity: Not on file  Stress: Not on file  Social Connections: Not on file  Intimate Partner Violence: Not on file    Functional Status Survey:    Family History  Problem Relation Age of Onset  . Cancer Other        Bladder cancer  . Heart attack Brother   . Diabetes Brother     Health Maintenance  Topic Date Due  . COVID-19 Vaccine (4 - Booster for Moderna series) 06/24/2020  . TETANUS/TDAP  10/15/2020  . INFLUENZA VACCINE  Completed  . DEXA SCAN  Completed  . PNA vac Low Risk Adult  Completed  . HPV VACCINES  Aged Out    Allergies  Allergen Reactions  . Morphine And Related Anaphylaxis and Other (See  Comments)    Given after knee replacement and code blue occurred  . Penicillins Hives, Itching and Other (See Comments)    Syncope, also  . Nsaids Other (See Comments)    Because of an abnormal kidney test result  . Shellfish Allergy Nausea And Vomiting and Other (See Comments)    Pt has shellfish allergy only.  Has had IV contrast x 2 and did fine.    Allergies as of 05/02/2020      Reactions   Morphine And Related Anaphylaxis, Other (See Comments)   Given after knee replacement and code blue occurred   Penicillins Hives, Itching, Other (See Comments)  Syncope, also   Nsaids Other (See Comments)   Because of an abnormal kidney test result   Shellfish Allergy Nausea And Vomiting, Other (See Comments)   Pt has shellfish allergy only.  Has had IV contrast x 2 and did fine.      Medication List       Accurate as of May 02, 2020  3:06 PM. If you have any questions, ask your nurse or doctor.        STOP taking these medications   magnesium oxide 400 (241.3 Mg) MG tablet Commonly known as: MAG-OX Stopped by: Virgie Dad, MD     TAKE these medications   acetaminophen 500 MG tablet Commonly known as: TYLENOL Take 2 tablets (1,000 mg total) by mouth 3 (three) times daily.   azelastine 0.1 % nasal spray Commonly known as: ASTELIN Place 1 spray into both nostrils daily as needed for rhinitis or allergies.   B-COMPLEX/FOLIC ACID/VITAMIN C PO Take 1 tablet by mouth daily with breakfast.   calcium carbonate 750 MG chewable tablet Commonly known as: TUMS EX Chew 750 mg by mouth daily as needed for heartburn.   diclofenac Sodium 1 % Gel Commonly known as: VOLTAREN Apply 2 g topically 4 (four) times daily.   docusate sodium 100 MG capsule Commonly known as: COLACE Take 100 mg by mouth daily as needed for mild constipation.   magnesium oxide 400 MG tablet Commonly known as: MAG-OX Take 400 mg by mouth daily.   meclizine 12.5 MG tablet Commonly known as:  ANTIVERT Take 1 tablet (12.5 mg total) by mouth 2 (two) times daily as needed for dizziness.   melatonin 3 MG Tabs tablet Take 1 tablet (3 mg total) by mouth at bedtime.   pantoprazole 40 MG tablet Commonly known as: PROTONIX Take 1 tablet (40 mg total) by mouth 2 (two) times daily.   Restasis 0.05 % ophthalmic emulsion Generic drug: cycloSPORINE Place 1 drop into both eyes in the morning and at bedtime.   sodium chloride 1 g tablet Take 1 tablet (1 g total) by mouth 3 (three) times daily with meals.   Vitamin D3 50 MCG (2000 UT) Tabs Take 2,000 Units by mouth in the morning.   zinc oxide 20 % ointment Apply 1 application topically as needed (for redness- to be applied to buttocks/peri area and after every incontinent episode).       Review of Systems Review of Systems  Constitutional: Negative for activity change, appetite change, chills, diaphoresis, fatigue and fever.  HENT: Negative for mouth sores, postnasal drip, rhinorrhea, sinus pain and sore throat.   Respiratory: Negative for apnea, cough, chest tightness, shortness of breath and wheezing.   Cardiovascular: Negative for chest pain, palpitations and leg swelling.  Gastrointestinal: Negative for abdominal distention, abdominal pain, constipation, diarrhea, nausea and vomiting.  Genitourinary: Negative for dysuria and frequency.  Musculoskeletal: Negative for arthralgias, joint swelling and myalgias.  Skin: Negative for rash.  Neurological: Negative for dizziness, syncope, weakness, light-headedness and numbness.  Psychiatric/Behavioral: Negative for behavioral problems, confusion and sleep disturbance.    Vitals:   05/02/20 1456  BP: (!) 135/95  Pulse: 98  Resp: 18  Temp: 98 F (36.7 C)  SpO2: 96%  Weight: 159 lb (72.1 kg)  Height: 5\' 3"  (1.6 m)   Body mass index is 28.17 kg/m. Physical Exam  Constitutional: Oriented to person, place, and time. Well-developed and well-nourished.  HENT:  Head:  Normocephalic.  Mouth/Throat: Oropharynx is clear and moist.  Eyes: Pupils  are equal, round, and reactive to light.  Neck: Neck supple.  Cardiovascular: Normal rate and normal heart sounds.  No murmur heard. Pulmonary/Chest: Effort normal and breath sounds normal. No respiratory distress. No wheezes. She has no rales.  Abdominal: Soft. Bowel sounds are normal. No distension. There is no tenderness. There is no rebound.  Musculoskeletal: No edema.  Lymphadenopathy: none Neurological: Alert and oriented to person, place, and time.  Skin: Skin is warm and dry.  Psychiatric: Normal mood and affect. Behavior is normal. Thought content normal.    Labs reviewed: Basic Metabolic Panel: Recent Labs    04/22/20 0109 04/23/20 0652 04/24/20 0058 04/28/20 1641 04/29/20 0521 04/30/20 0340  NA 126* 128* 127* 128* 127* 130*  K 4.1 4.2 4.3 5.2* 4.5 4.6  CL 98 96* 97* 97* 98 100  CO2 22 25 25  20* 22 24  GLUCOSE 111* 142* 109* 160* 119* 97  BUN 16 17 23  30* 33* 29*  CREATININE 0.83 0.97 0.97 1.16* 1.37* 1.06*  CALCIUM 8.4* 8.6* 8.3* 9.0 8.9 8.7*  MG 1.6* 1.6* 1.6*  --   --   --   PHOS 2.8 2.6 3.1  --   --  2.8   Liver Function Tests: Recent Labs    04/22/20 0109 04/23/20 0652 04/24/20 0058 04/30/20 0340  AST 22 33 23  --   ALT 16 25 20   --   ALKPHOS 75 103 77  --   BILITOT 1.0 0.9 0.7  --   PROT 6.5 6.9 5.8*  --   ALBUMIN 2.5* 2.5* 2.2* 2.4*   Recent Labs    03/02/20 1053  LIPASE 40   No results for input(s): AMMONIA in the last 8760 hours. CBC: Recent Labs    04/22/20 0109 04/23/20 0652 04/24/20 0058 04/28/20 1641 04/29/20 0521  WBC 8.4 6.7 7.1 12.2* 10.2  NEUTROABS 5.6 4.4 4.2  --   --   HGB 10.8* 11.9* 10.3* 14.6 12.0  HCT 32.5* 34.9* 31.0* 43.5 34.9*  MCV 84.9 83.9 84.9 85.1 83.5  PLT 322 336 332 441* 348   Cardiac Enzymes: No results for input(s): CKTOTAL, CKMB, CKMBINDEX, TROPONINI in the last 8760 hours. BNP: Invalid input(s): POCBNP No results found  for: HGBA1C Lab Results  Component Value Date   TSH 2.357 04/06/2020   No results found for: VITAMINB12 No results found for: FOLATE Lab Results  Component Value Date   IRON 74 05/27/2011   TIBC 299 05/27/2011   FERRITIN 498 (H) 05/27/2011    Imaging and Procedures obtained prior to SNF admission: No results found.  Assessment/Plan 1. Hyponatremia On Sodium Tabs Now Repeat Sodium 131 Also on FR 1200 Cc 2. Dizziness ? Etiology Cardiac Monitor started today Off Antihypertensive Working with therapy  3. Osteoarthritis of multiple joints, unspecified osteoarthritis type Tylenol PRN  4. Essential hypertension Passive Hypertension  5. Gastroesophageal reflux disease, unspecified whether esophagitis present On Protonix  6. Orthostatic hypotension Off All Antihypertensive Thigh High Ted hoses  7. Primary insomnia Melatonin PRN Off AMbien    Family/ staff Communication:   Labs/tests ordered: BMP in few days Total time spent in this patient care encounter was  45_  minutes; greater than 50% of the visit spent counseling patient and staff, reviewing records , Labs and coordinating care for problems addressed at this encounter.

## 2020-05-03 ENCOUNTER — Ambulatory Visit (INDEPENDENT_AMBULATORY_CARE_PROVIDER_SITE_OTHER): Payer: Medicare Other

## 2020-05-03 DIAGNOSIS — R55 Syncope and collapse: Secondary | ICD-10-CM

## 2020-05-03 LAB — BASIC METABOLIC PANEL
BUN: 23 — AB (ref 4–21)
CO2: 24 — AB (ref 13–22)
Chloride: 100 (ref 99–108)
Creatinine: 0.9 (ref 0.5–1.1)
Glucose: 112
Potassium: 4.7 (ref 3.4–5.3)
Sodium: 131 — AB (ref 137–147)

## 2020-05-03 LAB — COMPREHENSIVE METABOLIC PANEL: Calcium: 9.1 (ref 8.7–10.7)

## 2020-05-04 ENCOUNTER — Encounter: Payer: Self-pay | Admitting: Nurse Practitioner

## 2020-05-05 ENCOUNTER — Telehealth: Payer: Self-pay | Admitting: Cardiology

## 2020-05-05 NOTE — Telephone Encounter (Signed)
Received an outpatient alert from [facility] regarding abnormal EKG. Patient noted in afib RVR with rate 140bpm at 12:09pm for one minute. Follow up rhythm of afib in the 90s at last transmission one hour later. I called and spoke with the daughter on the phone with the patient in the background. She reports feeling at her baseline. No specific complaints. She is a frail older 87yo female with freq episodes of hypotension and falls. Currently in [facility] for rehab. Given this is the first documentation of rhythm and she is asymptomatic will continue with surveillance monitoring. Has a appt scheduled on 3/28 in the office. I will route to her PCP cardiologist and Angie (as she is scheduled to the see the patient on Monday) for further recommendations at this visit.

## 2020-05-06 NOTE — Progress Notes (Signed)
Cardiology Office Note:    Date:  05/07/2020   ID:  [name], DOB 17-Mar-1929, MRN [id number]  PCP:  Jennifer Dad, MD  Cardiologist:  Jennifer Heinz, MD   Referring MD: Jennifer Dad, MD   Chief Complaint  Patient presents with  . Follow-up    syncope    History of Present Illness:    [name] is a 87 y.o. female with a hx of hypertension, hyperlipidemia, anemia, mild aortic stenosis, hiatal hernia, remote ovarian cancer, prior prosthetic joint infection, and chronic hyponatremia.  She was hospitalized in February 2022 with syncope felt due to orthostasis or micturitional syncope.  She was discharged back to the SNF without Ambien, Norvasc, and nadolol.  She was being assisted to the bathroom at her friend's home when she lost consciousness.  Orthostatic vitals were positive for hypotension.  Pressure improved and she was scheduled for discharge; however, family was concerned about her recurrent syncope and requested a cardiology consult.  Dr. Claiborne Billings saw her in consult.  Cardiac enzymes were low and flat inconsistent with ACS.  Be felt secondary to episodic soft blood pressure.  Blood pressure improved with discontinuation of antihypertensives, TED hose, and salt tablets.  Echocardiogram performed in February was negative for hypertrophic obstructive cardiomyopathy, LV dysfunction or significant valvular disease.  Telemetry did not show any evidence of arrhythmia, significant bradycardia, or heart block.  Some concern for pneumomediastinum and GI was consulted.  Swallow was negative for esophageal perforation and PPI was recommended for esophagitis.  She was discharged with orders to place a heart monitor for her syncope.  [facility] notified the on-call provider on 05/05/2020 that Atrial fibrillation was detected. Strips were faxed and she was placed on my schedule.   She presents today for follow up. She is here with her son who helps with history. She is now  residing in the skilled nursing facility of [facility] - she is never getting up by herself. She has been in this facility for about a week without a recurrence of hospitalization and without another pre-syncope/syncope event. Heart monitor was placed on 05/03/20. She gets lightheaded when in bed ans rolls a certain way. She is very weak and she continues to be orthostatic.     Past Medical History:  Diagnosis Date  . Anemia associated with acute blood loss 04/03/2011  . Arthritis    knees   . Ascites   . Blood transfusion    hx of 2011   . Complication of anesthesia    Sodium drops per pt   . Cystitis   . DIARRHEA, ANTIBIOTIC ASSOCIATED 05/31/2009   Qualifier: Diagnosis of  By: Tommy Medal MD, Roderic Scarce    . GERD (gastroesophageal reflux disease)   . H/O hiatal hernia   . Hyperlipidemia   . Hypertension   . Hyponatremia   . Neutropenia with fever ([place]) 05/18/2011  . OSTEOMYELITIS, CHRONIC, LOWER LEG 04/23/2009   Qualifier: Diagnosis of  By: Johnnye Sima MD, Dellis Filbert    . Osteopenia   . OSTEOPOROSIS 04/23/2009   Qualifier: Diagnosis of  By: Johnnye Sima MD, Dellis Filbert    . Ovarian cancer ([place]) 01/25/2011  . Pneumonia    hx of   . Postmenopausal atrophic vaginitis   . PROSTHETIC JOINT COMPLICATION 7/82/9562   Qualifier: Diagnosis of  By: Tommy Medal MD, Roderic Scarce    . Renal disorder    Decreased kidney function  . Staph infection 2010   after knee replacement  . Urinary frequency   .  Vulvitis     Past Surgical History:  Procedure Laterality Date  . ABDOMINAL HYSTERECTOMY  04/01/2011   Procedure: HYSTERECTOMY ABDOMINAL;  Surgeon: Alvino Chapel, MD;  Location: [facility];  Service: Gynecology;  Laterality: N/A;  . APPENDECTOMY    . JOINT REPLACEMENT     R knee in 2008, 5 operations on L knee  . LAPAROTOMY  04/01/2011   Procedure: EXPLORATORY LAPAROTOMY;  Surgeon: Alvino Chapel, MD;  Location: [facility];  Service: Gynecology;  Laterality: N/A;  . OTHER SURGICAL HISTORY     hx of  C section 1966  . SALPINGOOPHORECTOMY  04/01/2011   Procedure: SALPINGO OOPHERECTOMY;  Surgeon: Alvino Chapel, MD;  Location: [facility];  Service: Gynecology;  Laterality: Bilateral;  . TONSILLECTOMY      Current Medications: Current Meds  Medication Sig  . acetaminophen (TYLENOL) 500 MG tablet Take 2 tablets (1,000 mg total) by mouth 3 (three) times daily.  Marland Kitchen azelastine (ASTELIN) 0.1 % nasal spray Place 1 spray into both nostrils daily as needed for rhinitis or allergies.  . B Complex-C-Folic Acid (B-COMPLEX/FOLIC ACID/VITAMIN C PO) Take 1 tablet by mouth daily with breakfast.  . calcium carbonate (TUMS EX) 750 MG chewable tablet Chew 750 mg by mouth daily as needed for heartburn.  . Cholecalciferol (VITAMIN D3) 50 MCG (2000 UT) TABS Take 2,000 Units by mouth in the morning.  . diclofenac Sodium (VOLTAREN) 1 % GEL Apply 2 g topically 4 (four) times daily.  Marland Kitchen docusate sodium (COLACE) 100 MG capsule Take 100 mg by mouth daily as needed for mild constipation.  . magnesium oxide (MAG-OX) 400 MG tablet Take 400 mg by mouth daily.  . meclizine (ANTIVERT) 12.5 MG tablet Take 1 tablet (12.5 mg total) by mouth 2 (two) times daily as needed for dizziness.  . melatonin 3 MG TABS tablet Take 1 tablet (3 mg total) by mouth at bedtime.  . pantoprazole (PROTONIX) 40 MG tablet Take 1 tablet (40 mg total) by mouth 2 (two) times daily.  . RESTASIS 0.05 % ophthalmic emulsion Place 1 drop into both eyes in the morning and at bedtime.  . sodium chloride 1 g tablet Take 1 tablet (1 g total) by mouth 3 (three) times daily with meals.  . zinc oxide 20 % ointment Apply 1 application topically as needed (for redness- to be applied to buttocks/peri area and after every incontinent episode).     Allergies:   Morphine and related, Penicillins, Nsaids, and Shellfish allergy   Social History   Socioeconomic History  . Marital status: Widowed    Spouse name: Not on file  . Number of children: Not on file  .  Years of education: Not on file  . Highest education level: Not on file  Occupational History  . Not on file  Tobacco Use  . Smoking status: Former Smoker    Quit date: 02/11/1952    Years since quitting: 68.2  . Smokeless tobacco: Never Used  Vaping Use  . Vaping Use: Never used  Substance and Sexual Activity  . Alcohol use: Not Currently  . Drug use: Never  . Sexual activity: Never  Other Topics Concern  . Not on file  Social History Narrative  . Not on file   Social Determinants of Health   Financial Resource Strain: Not on file  Food Insecurity: Not on file  Transportation Needs: Not on file  Physical Activity: Not on file  Stress: Not on file  Social Connections: Not on file  Family History: The patient's family history includes Cancer in an other family member; Diabetes in her brother; Heart attack in her brother.  ROS:   Please see the history of present illness.     All other systems reviewed and are negative.  EKGs/Labs/Other Studies Reviewed:    The following studies were reviewed today:  Echo 04/06/20 1. Left ventricular ejection fraction, by estimation, is 70 to 75%. The  left ventricle has hyperdynamic function. The left ventricle has no  regional wall motion abnormalities. There is mild left ventricular  hypertrophy. Left ventricular diastolic  parameters are consistent with Grade I diastolic dysfunction (impaired  relaxation).  2. Right ventricular systolic function is normal. The right ventricular  size is normal.  3. The mitral valve is normal in structure. No evidence of mitral valve  regurgitation. No evidence of mitral stenosis.  4. The aortic valve is tricuspid. Aortic valve regurgitation is mild.  Mild aortic valve stenosis. Aortic valve area, by VTI measures 1.91 cm.  Aortic valve mean gradient measures 10.0 mmHg. Aortic valve Vmax measures  2.12 m/s.  5. The inferior vena cava is normal in size with greater than 50%  respiratory  variability, suggesting right atrial pressure of 3 mmHg.   EKG:  EKG is  ordered today.  The ekg ordered today demonstrates sinus tachycardia HR 105  Recent Labs: 04/06/2020: TSH 2.357 04/24/2020: ALT 20; Magnesium 1.6 04/29/2020: Hemoglobin 12.0; Platelets 348 04/30/2020: BUN 29; Creatinine, Ser 1.06; Potassium 4.6; Sodium 130  Recent Lipid Panel No results found for: CHOL, TRIG, HDL, CHOLHDL, VLDL, LDLCALC, LDLDIRECT  Physical Exam:    VS:  BP 130/84   Pulse (!) 105   Ht 5\' 3"  (1.6 m)   SpO2 99%   BMI 28.17 kg/m     Wt Readings from Last 3 Encounters:  05/02/20 159 lb (72.1 kg)  05/01/20 160 lb (72.6 kg)  04/30/20 161 lb 6 oz (73.2 kg)     GEN: elderly female in NAD, HOH HEENT: Normal NECK: No JVD; No carotid bruits LYMPHATICS: No lymphadenopathy CARDIAC: regular rhythm, mildly tachycardic  RESPIRATORY:  Clear to auscultation without rales, wheezing or rhonchi  ABDOMEN: Soft, non-tender, non-distended MUSCULOSKELETAL:  No edema; No deformity  SKIN: Warm and dry NEUROLOGIC:  Alert and oriented x 3 PSYCHIATRIC:  Normal affect   ASSESSMENT:    1. Atrial flutter, unspecified type ([place])   2. Syncope and collapse   3. Orthostatic hypotension   4. Aortic valve stenosis, etiology of cardiac valve disease unspecified   5. Essential hypertension    PLAN:    In order of problems listed above:  Atrial flutter - newly recognized - unclear if this is related to her prior syncopal events - strips reviewed from [facility], appear to be atrial flutter - she is mildly tachycardic today but appears to be in sinus rhythm - she is asymptomatic today, so I hesitate to add BB given her current issues with orthostasis and dizziness   Need for chronic anticoagulation This patients CHA2DS2-VASc Score and unadjusted Ischemic Stroke Rate (% per year) is equal to 4.8 % stroke rate/year from a score of 4 (4age, female, HTN) - given frequent episodes of orthostatic hypotension, dizziness,  and frequent falls, will hold off on anticoagulation at this time   Recurrent syncope - previously felt due to soft BP - no further events since anti-hypertensives were discontinued, but is still having orthostatic dizziness - HASBLED score 2 = moderate bleeding risk   Mild aortic stenosis -  echo surveillance - not likely contributing to syncope   Hypertension - no medications due to orthostatic symptoms - son is concerned about reports of hypertension in the 180s at the facility   Case discussed with Dr. Gardiner Rhyme. Difficult situation given her dizziness, orthostasis, frequent falls, hx of syncope, and newly recognized atrial flutter. I discussed addition of midodrine but son and patient report episodes of hypertension and want to hold off for now. I suggested holding off on adding DOAC given her frailty and frequent orthostatic symptoms. No BB for now - I believe her atrial flutter so far has been asymptomatic; episodes did not correspond with dizziness/near-syncope. She will finish out the 30-day monitor and we will collect data on her frequency of falls, near/syncope, and orthostatic symptoms. She is now in the nursing facility and is helped anytime she is out of bed or transitioning to a chair. They understand her stroke risk.     Medication Adjustments/Labs and Tests Ordered: Current medicines are reviewed at length with the patient today.  Concerns regarding medicines are outlined above.  Orders Placed This Encounter  Procedures  . EKG 12-Lead   No orders of the defined types were placed in this encounter.   Signed, Ledora Bottcher, [place]  05/07/2020 12:53 PM    [facility]

## 2020-05-07 ENCOUNTER — Encounter: Payer: Self-pay | Admitting: Physician Assistant

## 2020-05-07 ENCOUNTER — Other Ambulatory Visit: Payer: Self-pay

## 2020-05-07 ENCOUNTER — Ambulatory Visit (INDEPENDENT_AMBULATORY_CARE_PROVIDER_SITE_OTHER): Payer: Medicare Other | Admitting: Physician Assistant

## 2020-05-07 VITALS — BP 130/84 | HR 105 | Ht 63.0 in

## 2020-05-07 DIAGNOSIS — I350 Nonrheumatic aortic (valve) stenosis: Secondary | ICD-10-CM

## 2020-05-07 DIAGNOSIS — I10 Essential (primary) hypertension: Secondary | ICD-10-CM | POA: Diagnosis not present

## 2020-05-07 DIAGNOSIS — I4892 Unspecified atrial flutter: Secondary | ICD-10-CM | POA: Diagnosis not present

## 2020-05-07 DIAGNOSIS — R55 Syncope and collapse: Secondary | ICD-10-CM | POA: Diagnosis not present

## 2020-05-07 DIAGNOSIS — I951 Orthostatic hypotension: Secondary | ICD-10-CM | POA: Diagnosis not present

## 2020-05-07 NOTE — Patient Instructions (Signed)
Medication Instructions:  No Changes *If you need a refill on your cardiac medications before your next appointment, please call your pharmacy*   Lab Work: No Labs If you have labs (blood work) drawn today and your tests are completely normal, you will receive your results only by: Marland Kitchen MyChart Message (if you have MyChart) OR . A paper copy in the mail If you have any lab test that is abnormal or we need to change your treatment, we will call you to review the results.   Testing/Procedures: No Testing   Follow-Up: At [facility], you and your health needs are our priority.  As part of our continuing mission to provide you with exceptional heart care, we have created designated Provider Care Teams.  These Care Teams include your primary Cardiologist (physician) and Advanced Practice Providers (APPs -  Physician Assistants and Nurse Practitioners) who all work together to provide you with the care you need, when you need it.   Your next appointment:   6 week(s)  The format for your next appointment:   In Person  Provider:   Oswaldo Milian, MD

## 2020-05-10 ENCOUNTER — Encounter: Payer: Self-pay | Admitting: Internal Medicine

## 2020-05-10 ENCOUNTER — Non-Acute Institutional Stay (SKILLED_NURSING_FACILITY): Payer: Medicare Other | Admitting: Internal Medicine

## 2020-05-10 DIAGNOSIS — I4892 Unspecified atrial flutter: Secondary | ICD-10-CM | POA: Diagnosis not present

## 2020-05-10 DIAGNOSIS — E871 Hypo-osmolality and hyponatremia: Secondary | ICD-10-CM

## 2020-05-10 DIAGNOSIS — I951 Orthostatic hypotension: Secondary | ICD-10-CM | POA: Diagnosis not present

## 2020-05-10 DIAGNOSIS — I10 Essential (primary) hypertension: Secondary | ICD-10-CM

## 2020-05-10 DIAGNOSIS — K219 Gastro-esophageal reflux disease without esophagitis: Secondary | ICD-10-CM | POA: Diagnosis not present

## 2020-05-10 DIAGNOSIS — F5101 Primary insomnia: Secondary | ICD-10-CM

## 2020-05-10 LAB — BASIC METABOLIC PANEL
BUN: 23 — AB (ref 4–21)
CO2: 23 — AB (ref 13–22)
Chloride: 100 (ref 99–108)
Creatinine: 0.8 (ref 0.5–1.1)
Glucose: 98
Potassium: 4.4 (ref 3.4–5.3)
Sodium: 133 — AB (ref 137–147)

## 2020-05-10 LAB — COMPREHENSIVE METABOLIC PANEL: Calcium: 9 (ref 8.7–10.7)

## 2020-05-10 NOTE — Progress Notes (Signed)
Location:    [place] Room Number: 4 Place of Service:  SNF [phone]) Provider:  Veleta Miners MD  Virgie Dad, MD  Patient Care Team: Virgie Dad, MD as PCP - General (Internal Medicine) Donato Heinz, MD as PCP - Cardiology (Cardiology) Brien Few, MD as Consulting Physician (Obstetrics and Gynecology) Marti Sleigh, MD as Consulting Physician (Gynecology) Nancy Marus, MD as Consulting Physician (Gynecologic Oncology)  Extended Emergency Contact Information Primary Emergency Contact: [facility] Address: [address]          [place], [place] [address] [name] of [place] Phone: [phone] Mobile Phone: [phone] Relation: Daughter  Code Status:  Full Code Goals of care: Advanced Directive information Advanced Directives 05/02/2020  Does Patient Have a Medical Advance Directive? Yes  Type of Advance Directive Living will;Healthcare Power of Attorney  Does patient want to make changes to medical advance directive? -  Copy of [place] in Chart? Yes - validated most recent copy scanned in chart (See row information)  Would patient like information on creating a medical advance directive? -  Pre-existing out of facility DNR order (yellow form or pink MOST form) -     Chief Complaint  Patient presents with  . Acute Visit    Hypertension    HPI:  Pt is a 87 y.o. female seen today for an acute visit for  Hypertension  Admitted in the hospital from 3/19-3/21 for hypotension and syncope Also in 3/7-3/16 for Syncope and also from 2/25-3/2 after having syncope due to orthostatic hypotension  During admission patient was found to be orthostatic.  All her antihypertensives were stopped.  She also was hyponatremic.  She is now on sodium tablets and fluid restriction.  Patient also had a cardiac monitor Placed.  Which has shown few episodes of atrial flutter.  She was recently seen by  cardiologist.  They have not started her on any beta-blocker or Eliquis because of her  risk of falling and  hypotension.  She continues to have monitored for another 2 weeks.  Her new issue is that her blood pressure is running very high especially in the evenings.  Some readings are  systolic blood pressure of 180 and 190. She continues to be asymtomatic Her son is really concerned and wanted something as needed.. Her last sodium is 131 She is feeling much better. Was able to work with Therapy today. Does not  Get up without the assist  Past Medical History:  Diagnosis Date  . Anemia associated with acute blood loss 04/03/2011  . Arthritis    knees   . Ascites   . Blood transfusion    hx of 2011   . Complication of anesthesia    Sodium drops per pt   . Cystitis   . DIARRHEA, ANTIBIOTIC ASSOCIATED 05/31/2009   Qualifier: Diagnosis of  By: Tommy Medal MD, Roderic Scarce    . GERD (gastroesophageal reflux disease)   . H/O hiatal hernia   . Hyperlipidemia   . Hypertension   . Hyponatremia   . Neutropenia with fever ([place]) 05/18/2011  . OSTEOMYELITIS, CHRONIC, LOWER LEG 04/23/2009   Qualifier: Diagnosis of  By: Johnnye Sima MD, Dellis Filbert    . Osteopenia   . OSTEOPOROSIS 04/23/2009   Qualifier: Diagnosis of  By: Johnnye Sima MD, Dellis Filbert    . Ovarian cancer ([place]) 01/25/2011  . Pneumonia    hx of   . Postmenopausal atrophic vaginitis   . PROSTHETIC JOINT COMPLICATION 2/77/8242  Qualifier: Diagnosis of  By: Tommy Medal MD, Roderic Scarce    . Renal disorder    Decreased kidney function  . Staph infection 2010   after knee replacement  . Urinary frequency   . Vulvitis    Past Surgical History:  Procedure Laterality Date  . ABDOMINAL HYSTERECTOMY  04/01/2011   Procedure: HYSTERECTOMY ABDOMINAL;  Surgeon: Alvino Chapel, MD;  Location: [facility];  Service: Gynecology;  Laterality: N/A;  . APPENDECTOMY    . JOINT REPLACEMENT     R knee in 2008, 5 operations on L knee  . LAPAROTOMY  04/01/2011   Procedure:  EXPLORATORY LAPAROTOMY;  Surgeon: Alvino Chapel, MD;  Location: [facility];  Service: Gynecology;  Laterality: N/A;  . OTHER SURGICAL HISTORY     hx of C section 1966  . SALPINGOOPHORECTOMY  04/01/2011   Procedure: SALPINGO OOPHERECTOMY;  Surgeon: Alvino Chapel, MD;  Location: [facility];  Service: Gynecology;  Laterality: Bilateral;  . TONSILLECTOMY      Allergies  Allergen Reactions  . Morphine And Related Anaphylaxis and Other (See Comments)    Given after knee replacement and code blue occurred  . Penicillins Hives, Itching and Other (See Comments)    Syncope, also  . Nsaids Other (See Comments)    Because of an abnormal kidney test result  . Shellfish Allergy Nausea And Vomiting and Other (See Comments)    Pt has shellfish allergy only.  Has had IV contrast x 2 and did fine.    Allergies as of 05/10/2020      Reactions   Morphine And Related Anaphylaxis, Other (See Comments)   Given after knee replacement and code blue occurred   Penicillins Hives, Itching, Other (See Comments)   Syncope, also   Nsaids Other (See Comments)   Because of an abnormal kidney test result   Shellfish Allergy Nausea And Vomiting, Other (See Comments)   Pt has shellfish allergy only.  Has had IV contrast x 2 and did fine.      Medication List       Accurate as of May 10, 2020  9:18 AM. If you have any questions, ask your nurse or doctor.        acetaminophen 500 MG tablet Commonly known as: TYLENOL Take 2 tablets (1,000 mg total) by mouth 3 (three) times daily.   azelastine 0.1 % nasal spray Commonly known as: ASTELIN Place 1 spray into both nostrils daily as needed for rhinitis or allergies.   B-COMPLEX/FOLIC ACID/VITAMIN C PO Take 1 tablet by mouth daily with breakfast.   calcium carbonate 750 MG chewable tablet Commonly known as: TUMS EX Chew 750 mg by mouth daily as needed for heartburn.   cycloSPORINE 0.05 % ophthalmic emulsion Commonly known as: RESTASIS Place 1  drop into both eyes 2 (two) times daily. What changed: Another medication with the same name was removed. Continue taking this medication, and follow the directions you see here. Changed by: Virgie Dad, MD   diclofenac Sodium 1 % Gel Commonly known as: VOLTAREN Apply 2 g topically 4 (four) times daily.   docusate sodium 100 MG capsule Commonly known as: COLACE Take 100 mg by mouth daily as needed for mild constipation.   lactose free nutrition Liqd Take 237 mLs by mouth 2 (two) times daily between meals.   magnesium oxide 400 MG tablet Commonly known as: MAG-OX Take 400 mg by mouth daily.   meclizine 12.5 MG tablet Commonly known as: ANTIVERT Take 1 tablet (  12.5 mg total) by mouth 2 (two) times daily as needed for dizziness.   melatonin 3 MG Tabs tablet Take 1 tablet (3 mg total) by mouth at bedtime.   pantoprazole 40 MG tablet Commonly known as: PROTONIX Take 1 tablet (40 mg total) by mouth 2 (two) times daily.   sodium chloride 1 g tablet Take 1 tablet (1 g total) by mouth 3 (three) times daily with meals.   Vitamin D3 50 MCG (2000 UT) Tabs Take 2,000 Units by mouth in the morning.   zinc oxide 20 % ointment Apply 1 application topically as needed (for redness- to be applied to buttocks/peri area and after every incontinent episode).       Review of Systems  Constitutional: Positive for activity change.  HENT: Negative.   Respiratory: Negative.   Cardiovascular: Positive for leg swelling.  Gastrointestinal: Negative.   Genitourinary: Negative.   Musculoskeletal: Positive for gait problem.  Skin: Negative.   Neurological: Positive for dizziness.  Psychiatric/Behavioral: Negative.     Immunization History  Administered Date(s) Administered  . Influenza Split 11/13/2012, 12/13/2012, 11/30/2013, 10/09/2014, 11/24/2018  . Influenza, High Dose Seasonal PF 12/01/2013, 10/04/2014, 10/12/2015, 11/06/2016, 11/12/2017  . Influenza,inj,Quad PF,6+ Mos 10/24/2010   . Influenza-Unspecified 10/24/2010  . Moderna Sars-Covid-2 Vaccination 02/14/2019, 03/14/2019, 12/26/2019  . Pneumococcal Conjugate-13 09/28/2013  . Pneumococcal Polysaccharide-23 03/05/2012  . Td 08/27/2000  . Tdap 08/27/2000, 10/16/2010  . Zoster 01/11/2013, 03/31/2020   Pertinent  Health Maintenance Due  Topic Date Due  . INFLUENZA VACCINE  Completed  . DEXA SCAN  Completed  . PNA vac Low Risk Adult  Completed   Fall Risk  03/02/2014  Falls in the past year? No   Functional Status Survey:    Vitals:   05/10/20 0911  BP: (!) 155/88  Pulse: 89  Resp: 20  Temp: 98.1 F (36.7 C)  SpO2: 98%  Weight: 160 lb (72.6 kg)  Height: 5\' 3"  (1.6 m)   Body mass index is 28.34 kg/m. Physical Exam  Constitutional: Oriented to person, place, and time. Well-developed and well-nourished.  HENT:  Head: Normocephalic.  Mouth/Throat: Oropharynx is clear and moist.  Eyes: Pupils are equal, round, and reactive to light.  Neck: Neck supple.  Cardiovascular: Normal rate and normal heart sounds.  No murmur heard. Pulmonary/Chest: Effort normal and breath sounds normal. No respiratory distress. No wheezes. She has no rales.  Abdominal: Soft. Bowel sounds are normal. No distension. There is no tenderness. There is no rebound.  Musculoskeletal: Mild edema  Lymphadenopathy: none Neurological: Alert and oriented to person, place, and time.  Skin: Skin is warm and dry.  Psychiatric: Normal mood and affect. Behavior is normal. Thought content normal.    Labs reviewed: Recent Labs    04/22/20 0109 04/23/20 0652 04/24/20 0058 04/28/20 1641 04/29/20 0521 04/30/20 0340 05/03/20 0000  NA 126* 128* 127* 128* 127* 130* 131*  K 4.1 4.2 4.3 5.2* 4.5 4.6 4.7  CL 98 96* 97* 97* 98 100 100  CO2 22 25 25  20* 22 24 24*  GLUCOSE 111* 142* 109* 160* 119* 97  --   BUN 16 17 23  30* 33* 29* 23*  CREATININE 0.83 0.97 0.97 1.16* 1.37* 1.06* 0.9  CALCIUM 8.4* 8.6* 8.3* 9.0 8.9 8.7* 9.1  MG 1.6* 1.6*  1.6*  --   --   --   --   PHOS 2.8 2.6 3.1  --   --  2.8  --    Recent Labs    04/22/20  0109 04/23/20 0652 04/24/20 0058 04/30/20 0340  AST 22 33 23  --   ALT 16 25 20   --   ALKPHOS 75 103 77  --   BILITOT 1.0 0.9 0.7  --   PROT 6.5 6.9 5.8*  --   ALBUMIN 2.5* 2.5* 2.2* 2.4*   Recent Labs    04/22/20 0109 04/23/20 0652 04/24/20 0058 04/28/20 1641 04/29/20 0521  WBC 8.4 6.7 7.1 12.2* 10.2  NEUTROABS 5.6 4.4 4.2  --   --   HGB 10.8* 11.9* 10.3* 14.6 12.0  HCT 32.5* 34.9* 31.0* 43.5 34.9*  MCV 84.9 83.9 84.9 85.1 83.5  PLT 322 336 332 441* 348   Lab Results  Component Value Date   TSH 2.357 04/06/2020   No results found for: HGBA1C No results found for: CHOL, HDL, LDLCALC, LDLDIRECT, TRIG, CHOLHDL  Significant Diagnostic Results in last 30 days:  CT ABDOMEN PELVIS WO CONTRAST  Addendum Date: 04/17/2020   ADDENDUM REPORT: 04/17/2020 16:01 ADDENDUM: Findings should include old healed right ninth and tenth rib fractures. Electronically Signed   By: Iven Finn M.D.   On: 04/17/2020 16:01   Result Date: 04/17/2020 CLINICAL DATA:  Nonlocalized acute abdominal pain. EXAM: CT ABDOMEN AND PELVIS WITHOUT CONTRAST TECHNIQUE: Multidetector CT imaging of the abdomen and pelvis was performed following the standard protocol without IV contrast. COMPARISON:  CT angiography chest 04/16/2020, CT abdomen pelvis 03/05/2011 FINDINGS: Lower chest: Persistent trace right pleural effusion. Bilateral lower lobe subsegmental atelectasis. At least small volume hiatal hernia with persistent distal esophageal thickening. Medial left base atelectasis (6:60); however, a component of trace inferior pneumomediastinum cannot be excluded (3: 4-11). Hepatobiliary: No focal liver abnormality. The gallbladder is contracted. Punctate calcific density within the gallbladder lumen likely represents a gallstone. No gallbladder wall thickening or pericholecystic fluid. No biliary dilatation. Pancreas: No focal  lesion. Normal pancreatic contour. No surrounding inflammatory changes. No main pancreatic ductal dilatation. Spleen: Normal in size without focal abnormality. Adrenals/Urinary Tract: No adrenal nodule bilaterally. Punctate calcific densities within bilateral kidneys. No hydronephrosis and no contour-deforming renal mass. No ureterolithiasis or hydroureter. Fluid fluid level within the urinary bladder likely related to previously administered intravenous contrast for CT angiography chest 04/16/2020. Stomach/Bowel: Stomach is within normal limits. No evidence of bowel wall thickening or dilatation. Diffuse colonic diverticulosis. No pneumatosis. The appendix not definitely identified. Vascular/Lymphatic: Several punctate phleboliths are noted within the pelvis. Focal dilatation of the infrarenal abdominal aorta measuring up to 2.8 cm in diameter with no definite abdominal aorta or iliac aneurysm. Moderate to severe atherosclerotic plaque of the aorta and its branches. No abdominal, pelvic, or inguinal lymphadenopathy. Reproductive: Status post hysterectomy. No adnexal masses. Other: No intraperitoneal free fluid. No intraperitoneal free gas. No organized fluid collection. Musculoskeletal: Two large consecutive anterior abdominal wall hernias with the likely paraumbilical hernia containing a long segment of the mid to distal transverse colon with an abdominal defect of 5.8 x 4.5 cm. The inferior-most hernia likely a infraumbilical hernia contains several loops of small bowel and demonstrates a abdominal defect of approximately 5.8 x 3.7 cm. Grade 1 anterolisthesis of L4 on L5. Multilevel facet arthropathy. Visualized lower thoracic spine demonstrates multilevel osteophyte formation. No suspicious lytic or blastic osseous lesions. No acute displaced fracture. IMPRESSION: 1. Question trace inferior pneumomediastinum. In the setting of a small hiatal hernia and distal esophageal thickening, a small or contained  esophageal perforation cannot be excluded. 2. Two large consecutive anterior abdominal wall hernias, one of which is likely paraumbilical,  containing small and large bowel. No definite findings of ischemia or associated bowel obstruction with however limited evaluation due to venous noncontrast study. 3. Colonic diverticulosis with no acute diverticulitis. 4. Nonobstructive punctate nephrolithiasis bilaterally. 5. Aortic Atherosclerosis (ICD10-I70.0). These results were called by telephone at the time of interpretation on 04/17/2020 at 3:45 pm to provider [facility] , who verbally acknowledged these results. Electronically Signed: By: Iven Finn M.D. On: 04/17/2020 15:52   CT Angio Chest PE W and/or Wo Contrast  Result Date: 04/16/2020 CLINICAL DATA:  Chest pain, history of prior fall, initial encounter EXAM: CT ANGIOGRAPHY CHEST WITH CONTRAST TECHNIQUE: Multidetector CT imaging of the chest was performed using the standard protocol during bolus administration of intravenous contrast. Multiplanar CT image reconstructions and MIPs were obtained to evaluate the vascular anatomy. CONTRAST:  36mL OMNIPAQUE IOHEXOL 350 MG/ML SOLN COMPARISON:  Chest x-ray from earlier in the same day. FINDINGS: Cardiovascular: Thoracic aorta demonstrates atherosclerotic calcifications without aneurysmal dilatation. The degree of aortic enhancement is not sufficient to evaluate for dissection. No cardiac enlargement is seen. Diffuse coronary calcifications are noted. The pulmonary artery shows a normal branching pattern. No filling defect to suggest pulmonary embolism is noted. Mediastinum/Nodes: Thoracic inlet is within normal limits. No sizable hilar or mediastinal adenopathy is noted. Esophageal thickening is noted distally likely related to reflux. Hiatal hernia is seen. Lungs/Pleura: Mild bibasilar atelectatic changes are seen. Small right pleural effusion is noted. No sizable infiltrate is seen. No sizable parenchymal nodules  are noted. Upper Abdomen: Visualized upper abdomen is otherwise within normal limits. Musculoskeletal: Degenerative changes of the thoracic spine are seen. No acute rib abnormality is seen. Review of the MIP images confirms the above findings. IMPRESSION: No evidence of pulmonary emboli. Small right pleural effusion and bibasilar atelectatic changes. Hiatal hernia with distal esophageal thickening likely related to reflux. Aortic Atherosclerosis (ICD10-I70.0). Electronically Signed   By: Inez Catalina M.D.   On: 04/16/2020 19:55   DG CHEST PORT 1 VIEW  Result Date: 04/18/2020 CLINICAL DATA:  History of prior fall.  History of chest pain. EXAM: PORTABLE CHEST 1 VIEW COMPARISON:  CT 04/16/2020.  Chest x-ray 04/16/2020. FINDINGS: Mediastinum hilar structures normal. Heart size normal. No focal infiltrate. No pleural effusion or pneumothorax. Prominent skin folds noted. Degenerative change thoracic spine. IMPRESSION: No acute cardiopulmonary disease.  Chest is stable from prior exam. Electronically Signed   By: Marcello Moores  Register   On: 04/18/2020 07:39   DG Chest Portable 1 View  Result Date: 04/16/2020 CLINICAL DATA:  Recent fall, chest pain EXAM: PORTABLE CHEST 1 VIEW COMPARISON:  08/04/2019 FINDINGS: Single frontal view of the chest demonstrates an unremarkable cardiac silhouette. No airspace disease, effusion, or pneumothorax. No acute bony abnormalities. IMPRESSION: 1. No acute intrathoracic process. Electronically Signed   By: Randa Ngo M.D.   On: 04/16/2020 18:49   DG Foot 2 Views Right  Result Date: 04/18/2020 CLINICAL DATA:  Pain following recent fall EXAM: RIGHT FOOT - 2 VIEW COMPARISON:  March 19, 2020. FINDINGS: Frontal and lateral views obtained. Subtle transverse lucencies in the proximal third and fourth metatarsals are stable, concerning for nondisplaced fractures. Evidence of old trauma with remodeling distal fourth metatarsal. No evidence of new fracture compared to prior study. No  dislocation. There is underlying osteoporosis. No appreciable joint space narrowing. There is an inferior calcaneal spur. There is mild spurring in the dorsal midfoot. Foci of arterial vascular calcification noted. IMPRESSION: Persistent subtle transverse lucencies in the proximal third and proximal fourth metatarsals,  unchanged from prior study and likely representing subtle nondisplaced fractures in these areas. Old healed fracture distal fourth metatarsal with remodeling. No acute fracture compared to prior study. No dislocation. Mild spurring dorsal midfoot. Inferior calcaneal spur. Foci of arterial vascular atherosclerotic calcification noted. Electronically Signed   By: Lowella Grip III M.D.   On: 04/18/2020 11:16   DG ESOPHAGUS W SINGLE CM (SOL OR THIN BA)  Result Date: 04/17/2020 CLINICAL DATA:  Possible distal esophageal perforation near the GE junction. EXAM: ESOPHOGRAM/BARIUM SWALLOW TECHNIQUE: Single contrast examination was performed using water-soluble contrast. FLUOROSCOPY TIME:  Fluoroscopy Time:  1.7 minutes Radiation Exposure Index (if provided by the fluoroscopic device): 21.5 mGy Number of Acquired Spot Images: 0 COMPARISON:  CT abdomen pelvis from same day. Esophagram dated May 09, 2016. FINDINGS: Normal esophageal course and contour. No obstruction, stricture, or perforation. Unchanged small hiatal hernia. The procedure was terminated after the patient aspirated a small amount of contrast. IMPRESSION: 1. No evidence of esophageal perforation. 2. Positive for aspiration. Consider modified barium swallow study for further evaluation. Electronically Signed   By: Titus Dubin M.D.   On: 04/17/2020 19:05    Assessment/Plan Essential hypertension Discussed with her son the risk of starting her on antihypertensive Will try  low-dose of hydralazine 10 mg as needed SBP more then 170/90 Discussed with the nurses Keep her in bed after giving Hydralazine Check BP after 2  hours  Orthostatic hypotension Continues to use TED hose  Hyponatremia Sodium stabilized on sodium tabs and fluid restriction Repeat Bmp pending  Atrial flutter, unspecified type [facility]) Cardiology has not started her on beta-blocker or Anticoagulation due to risk of falls and Hypotension Continues to have monitor for another 2 weeks  Gastroesophageal reflux disease, unspecified whether esophagitis present On Protonix Primary insomnia On melatonin.    Family/ staff Communication:   Labs/tests ordered:   Total time spent in this patient care encounter was 45 _  minutes; greater than 50% of the visit spent counseling patient ,son and staff, reviewing records , Labs and coordinating care for problems addressed at this encounter.

## 2020-05-14 LAB — BASIC METABOLIC PANEL
BUN: 33 — AB (ref 4–21)
CO2: 24 — AB (ref 13–22)
Chloride: 99 (ref 99–108)
Creatinine: 1 (ref 0.5–1.1)
Glucose: 111
Potassium: 4.6 (ref 3.4–5.3)
Sodium: 132 — AB (ref 137–147)

## 2020-05-14 LAB — COMPREHENSIVE METABOLIC PANEL: Calcium: 9.2 (ref 8.7–10.7)

## 2020-05-16 ENCOUNTER — Non-Acute Institutional Stay (SKILLED_NURSING_FACILITY): Payer: Medicare Other | Admitting: Nurse Practitioner

## 2020-05-16 DIAGNOSIS — E871 Hypo-osmolality and hyponatremia: Secondary | ICD-10-CM

## 2020-05-16 DIAGNOSIS — K5901 Slow transit constipation: Secondary | ICD-10-CM

## 2020-05-16 DIAGNOSIS — C569 Malignant neoplasm of unspecified ovary: Secondary | ICD-10-CM

## 2020-05-16 DIAGNOSIS — I10 Essential (primary) hypertension: Secondary | ICD-10-CM

## 2020-05-16 DIAGNOSIS — R55 Syncope and collapse: Secondary | ICD-10-CM

## 2020-05-16 DIAGNOSIS — K432 Incisional hernia without obstruction or gangrene: Secondary | ICD-10-CM | POA: Diagnosis not present

## 2020-05-16 DIAGNOSIS — K219 Gastro-esophageal reflux disease without esophagitis: Secondary | ICD-10-CM | POA: Diagnosis not present

## 2020-05-16 DIAGNOSIS — F5101 Primary insomnia: Secondary | ICD-10-CM | POA: Diagnosis not present

## 2020-05-17 ENCOUNTER — Non-Acute Institutional Stay (SKILLED_NURSING_FACILITY): Payer: Medicare Other | Admitting: Internal Medicine

## 2020-05-17 DIAGNOSIS — R55 Syncope and collapse: Secondary | ICD-10-CM | POA: Diagnosis not present

## 2020-05-17 DIAGNOSIS — I4892 Unspecified atrial flutter: Secondary | ICD-10-CM

## 2020-05-17 DIAGNOSIS — I10 Essential (primary) hypertension: Secondary | ICD-10-CM

## 2020-05-17 DIAGNOSIS — K219 Gastro-esophageal reflux disease without esophagitis: Secondary | ICD-10-CM

## 2020-05-17 DIAGNOSIS — F5101 Primary insomnia: Secondary | ICD-10-CM | POA: Diagnosis not present

## 2020-05-17 DIAGNOSIS — E871 Hypo-osmolality and hyponatremia: Secondary | ICD-10-CM

## 2020-05-18 ENCOUNTER — Encounter: Payer: Self-pay | Admitting: Nurse Practitioner

## 2020-05-18 ENCOUNTER — Encounter: Payer: Self-pay | Admitting: Internal Medicine

## 2020-05-18 NOTE — Assessment & Plan Note (Signed)
off Ambien.  

## 2020-05-18 NOTE — Assessment & Plan Note (Signed)
takes prn Colace.

## 2020-05-18 NOTE — Progress Notes (Signed)
Location:   SNF [place] Room Number: 4 Place of Service:  SNF (31) Provider: [facility] Oakes Mccready NP  Virgie Dad, MD  Patient Care Team: Virgie Dad, MD as PCP - General (Internal Medicine) Donato Heinz, MD as PCP - Cardiology (Cardiology) Brien Few, MD as Consulting Physician (Obstetrics and Gynecology) Marti Sleigh, MD as Consulting Physician (Gynecology) Nancy Marus, MD as Consulting Physician (Gynecologic Oncology)  Extended Emergency Contact Information Primary Emergency Contact: [facility] Address: [address]          [place], [place] [address] [name] of [place] Phone: [phone] Mobile Phone: [phone] Relation: Daughter  Code Status:  DNR Goals of care: Advanced Directive information Advanced Directives 05/18/2020  Does Patient Have a Medical Advance Directive? Yes  Type of Advance Directive Living will;Healthcare Power of Attorney  Does patient want to make changes to medical advance directive? No - Patient declined  Copy of [place] in Chart? Yes - validated most recent copy scanned in chart (See row information)  Would patient like information on creating a medical advance directive? -  Pre-existing out of facility DNR order (yellow form or pink MOST form) -     Chief Complaint  Patient presents with  . Medical Management of Chronic Issues    HPI:  Pt is a 87 y.o. female seen today for medical management of chronic diseases.    Hyponatremia, stopped Furosemide-may be prn per nephrology,  started NaCl, 1551ml fluid restriction. May consider SIADH workup, Nephrology consultation in hospital and may referral as outpatient. Na 132 05/14/20             Syncope: outpatient cardiac monitor. Orthostasis vs micturition vs dehydration.              04/16/20-04/24/20 Syncope, noted orthostatic hypotension, treated with IVF, Cardiology arranged outpatient cardiac monitor. Suspected SIADH, placed on  1200 ml/day fluid restrictin.              04/06/20-04/11/20 Syncope with fall from commode with facial abrasion, suspected orthostatic syncope vs Ambien effect, LVEF 70-75%, mild AR and AS.              03/02/20 presyncope.             Hx of ovarian CA s/p TAH/BSO 03/2011, in remission.             Constipation takes prn Colace.  HTN, Hydralazine for Sbp >170, Dbp>90 OP, takes Ca, Vit D, Prolia GERD, takes Pantoprazole.  Insomnia, off Ambien.                  Past Medical History:  Diagnosis Date  . Anemia associated with acute blood loss 04/03/2011  . Arthritis    knees   . Ascites   . Blood transfusion    hx of 2011   . Complication of anesthesia    Sodium drops per pt   . Cystitis   . DIARRHEA, ANTIBIOTIC ASSOCIATED 05/31/2009   Qualifier: Diagnosis of  By: Tommy Medal MD, Roderic Scarce    . GERD (gastroesophageal reflux disease)   . H/O hiatal hernia   . Hyperlipidemia   . Hypertension   . Hyponatremia   . Neutropenia with fever ([place]) 05/18/2011  . OSTEOMYELITIS, CHRONIC, LOWER LEG 04/23/2009   Qualifier: Diagnosis of  By: Johnnye Sima MD, Dellis Filbert    . Osteopenia   . OSTEOPOROSIS 04/23/2009   Qualifier: Diagnosis of  By: Johnnye Sima MD, Dellis Filbert    . Ovarian cancer (  [place]) 01/25/2011  . Pneumonia    hx of   . Postmenopausal atrophic vaginitis   . PROSTHETIC JOINT COMPLICATION 08/16/2374   Qualifier: Diagnosis of  By: Tommy Medal MD, Roderic Scarce    . Renal disorder    Decreased kidney function  . Staph infection 2010   after knee replacement  . Urinary frequency   . Vulvitis    Past Surgical History:  Procedure Laterality Date  . ABDOMINAL HYSTERECTOMY  04/01/2011   Procedure: HYSTERECTOMY ABDOMINAL;  Surgeon: Alvino Chapel, MD;  Location: [facility];  Service: Gynecology;  Laterality: N/A;  . APPENDECTOMY    . JOINT REPLACEMENT     R knee in 2008, 5 operations on L knee  . LAPAROTOMY  04/01/2011   Procedure: EXPLORATORY LAPAROTOMY;   Surgeon: Alvino Chapel, MD;  Location: [facility];  Service: Gynecology;  Laterality: N/A;  . OTHER SURGICAL HISTORY     hx of C section 1966  . SALPINGOOPHORECTOMY  04/01/2011   Procedure: SALPINGO OOPHERECTOMY;  Surgeon: Alvino Chapel, MD;  Location: [facility];  Service: Gynecology;  Laterality: Bilateral;  . TONSILLECTOMY      Allergies  Allergen Reactions  . Morphine And Related Anaphylaxis and Other (See Comments)    Given after knee replacement and code blue occurred  . Penicillins Hives, Itching and Other (See Comments)    Syncope, also  . Nsaids Other (See Comments)    Because of an abnormal kidney test result  . Shellfish Allergy Nausea And Vomiting and Other (See Comments)    Pt has shellfish allergy only.  Has had IV contrast x 2 and did fine.    Allergies as of 05/16/2020      Reactions   Morphine And Related Anaphylaxis, Other (See Comments)   Given after knee replacement and code blue occurred   Penicillins Hives, Itching, Other (See Comments)   Syncope, also   Nsaids Other (See Comments)   Because of an abnormal kidney test result   Shellfish Allergy Nausea And Vomiting, Other (See Comments)   Pt has shellfish allergy only.  Has had IV contrast x 2 and did fine.      Medication List       Accurate as of May 16, 2020 11:59 PM. If you have any questions, ask your nurse or doctor.        acetaminophen 500 MG tablet Commonly known as: TYLENOL Take 2 tablets (1,000 mg total) by mouth 3 (three) times daily.   azelastine 0.1 % nasal spray Commonly known as: ASTELIN Place 1 spray into both nostrils daily as needed for rhinitis or allergies.   B-COMPLEX/FOLIC ACID/VITAMIN C PO Take 1 tablet by mouth daily with breakfast.   calcium carbonate 750 MG chewable tablet Commonly known as: TUMS EX Chew 750 mg by mouth daily as needed for heartburn.   cycloSPORINE 0.05 % ophthalmic emulsion Commonly known as: RESTASIS Place 1 drop into both eyes 2 (two)  times daily.   diclofenac Sodium 1 % Gel Commonly known as: VOLTAREN Apply 2 g topically 4 (four) times daily.   docusate sodium 100 MG capsule Commonly known as: COLACE Take 100 mg by mouth daily as needed for mild constipation.   hydrALAZINE 10 MG tablet Commonly known as: APRESOLINE Take 10 mg by mouth daily as needed. PRN only if SBP more then 170 /90   lactose free nutrition Liqd Take 237 mLs by mouth 2 (two) times daily between meals.   magnesium oxide 400 MG tablet  Commonly known as: MAG-OX Take 400 mg by mouth daily.   meclizine 12.5 MG tablet Commonly known as: ANTIVERT Take 1 tablet (12.5 mg total) by mouth 2 (two) times daily as needed for dizziness.   melatonin 3 MG Tabs tablet Take 1 tablet (3 mg total) by mouth at bedtime.   pantoprazole 40 MG tablet Commonly known as: PROTONIX Take 1 tablet (40 mg total) by mouth 2 (two) times daily.   sodium chloride 1 g tablet Take 1 tablet (1 g total) by mouth 3 (three) times daily with meals.   Vitamin D3 50 MCG (2000 UT) Tabs Take 2,000 Units by mouth in the morning.   zinc oxide 20 % ointment Apply 1 application topically as needed (for redness- to be applied to buttocks/peri area and after every incontinent episode).       Review of Systems  Constitutional: Negative for fatigue, fever and unexpected weight change.  HENT: Negative for congestion and trouble swallowing.   Eyes: Negative for visual disturbance.  Respiratory: Negative for cough and shortness of breath.   Cardiovascular: Negative for chest pain, palpitations and leg swelling.  Gastrointestinal: Negative for abdominal pain and constipation.  Genitourinary: Negative for dysuria, frequency and urgency.  Musculoskeletal: Positive for arthralgias, back pain and gait problem.  Skin: Negative for color change.  Neurological: Negative for tremors, speech difficulty, weakness, light-headedness and headaches.  Hematological: Bruises/bleeds easily.        Right lower back  Psychiatric/Behavioral: Negative for behavioral problems, confusion and sleep disturbance. The patient is not nervous/anxious.     Immunization History  Administered Date(s) Administered  . Influenza Split 11/13/2012, 12/13/2012, 11/30/2013, 10/09/2014, 11/24/2018  . Influenza, High Dose Seasonal PF 12/01/2013, 10/04/2014, 10/12/2015, 11/06/2016, 11/12/2017  . Influenza,inj,Quad PF,6+ Mos 10/24/2010  . Influenza-Unspecified 10/24/2010  . Moderna Sars-Covid-2 Vaccination 02/14/2019, 03/14/2019, 12/26/2019  . Pneumococcal Conjugate-13 09/28/2013  . Pneumococcal Polysaccharide-23 03/05/2012  . Td 08/27/2000  . Tdap 08/27/2000, 10/16/2010  . Zoster 01/11/2013, 03/31/2020   Pertinent  Health Maintenance Due  Topic Date Due  . INFLUENZA VACCINE  09/10/2020  . DEXA SCAN  Completed  . PNA vac Low Risk Adult  Completed   Fall Risk  03/02/2014  Falls in the past year? No   Functional Status Survey:    Vitals:   05/16/20 1248  BP: 140/87  Pulse: 94  Resp: 20  Temp: (!) 97.5 F (36.4 C)  SpO2: 96%  Weight: 160 lb (72.6 kg)   Body mass index is 28.34 kg/m. Physical Exam Vitals and nursing note reviewed.  Constitutional:      General: She is not in acute distress.    Appearance: Normal appearance. She is not ill-appearing, toxic-appearing or diaphoretic.     Comments: Over weight   HENT:     Head: Normocephalic and atraumatic.     Mouth/Throat:     Mouth: Mucous membranes are moist.  Eyes:     Extraocular Movements: Extraocular movements intact.     Conjunctiva/sclera: Conjunctivae normal.     Pupils: Pupils are equal, round, and reactive to light.  Cardiovascular:     Rate and Rhythm: Normal rate and regular rhythm.     Heart sounds: No murmur heard.   Pulmonary:     Effort: Pulmonary effort is normal.     Breath sounds: No rales.  Abdominal:     General: Bowel sounds are normal.     Palpations: Abdomen is soft.     Tenderness: There is no  abdominal tenderness.  Hernia: A hernia is present.     Comments: Incision hernia  Musculoskeletal:     Cervical back: Normal range of motion and neck supple.     Right lower leg: No edema.     Left lower leg: No edema.  Skin:    General: Skin is warm and dry.  Neurological:     General: No focal deficit present.     Mental Status: She is alert and oriented to person, place, and time. Mental status is at baseline.     Motor: No weakness.     Coordination: Coordination normal.     Gait: Gait abnormal.  Psychiatric:        Mood and Affect: Mood normal.        Behavior: Behavior normal.        Thought Content: Thought content normal.        Judgment: Judgment normal.     Labs reviewed: Recent Labs    04/22/20 0109 04/23/20 0652 04/24/20 0058 04/28/20 1641 04/29/20 0521 04/30/20 0340 05/03/20 0000 05/10/20 0000 05/14/20 0000  NA 126* 128* 127* 128* 127* 130* 131* 133* 132*  K 4.1 4.2 4.3 5.2* 4.5 4.6 4.7 4.4 4.6  CL 98 96* 97* 97* 98 100 100 100 99  CO2 22 25 25  20* 22 24 24* 23* 24*  GLUCOSE 111* 142* 109* 160* 119* 97  --   --   --   BUN 16 17 23  30* 33* 29* 23* 23* 33*  CREATININE 0.83 0.97 0.97 1.16* 1.37* 1.06* 0.9 0.8 1.0  CALCIUM 8.4* 8.6* 8.3* 9.0 8.9 8.7* 9.1 9.0 9.2  MG 1.6* 1.6* 1.6*  --   --   --   --   --   --   PHOS 2.8 2.6 3.1  --   --  2.8  --   --   --    Recent Labs    04/22/20 0109 04/23/20 0652 04/24/20 0058 04/30/20 0340  AST 22 33 23  --   ALT 16 25 20   --   ALKPHOS 75 103 77  --   BILITOT 1.0 0.9 0.7  --   PROT 6.5 6.9 5.8*  --   ALBUMIN 2.5* 2.5* 2.2* 2.4*   Recent Labs    04/22/20 0109 04/23/20 0652 04/24/20 0058 04/28/20 1641 04/29/20 0521  WBC 8.4 6.7 7.1 12.2* 10.2  NEUTROABS 5.6 4.4 4.2  --   --   HGB 10.8* 11.9* 10.3* 14.6 12.0  HCT 32.5* 34.9* 31.0* 43.5 34.9*  MCV 84.9 83.9 84.9 85.1 83.5  PLT 322 336 332 441* 348   Lab Results  Component Value Date   TSH 2.357 04/06/2020   No results found for: HGBA1C No  results found for: CHOL, HDL, LDLCALC, LDLDIRECT, TRIG, CHOLHDL  Significant Diagnostic Results in last 30 days:  No results found.  Assessment/Plan Essential hypertension Hydralazine for Sbp >170, Dbp>90  GERD , takes Pantoprazole.   Insomnia off Ambien.   Incisional hernia, periumbilical, without obstruction or gangrene Mostly left   Slow transit constipation  takes prn Colace.   Ovarian cancer Hx of ovarian CA s/p TAH/BSO 03/2011, in remission. Diagnosed clinically and with cytology of ascitic fluid Dec 2012  Syncope and collapse  Syncope: outpatient cardiac monitor. Orthostasis vs micturition vs dehydration.              04/16/20-04/24/20 Syncope, noted orthostatic hypotension, treated with IVF, Cardiology arranged outpatient cardiac monitor. Suspected SIADH, placed on 1200 ml/day fluid restrictin.  04/06/20-04/11/20 Syncope with fall from commode with facial abrasion, suspected orthostatic syncope vs Ambien effect, LVEF 70-75%, mild AR and AS.              03/02/20 presyncope.   Hyponatremia Hyponatremia, stopped Furosemide-may be prn per nephrology,  started NaCl, 1500 fluid restriction. May consider SIADH workup, Nephrology consultation in hospital and may referral as outpatient. Na 132 05/14/20    Family/ staff Communication: plan of care reviewed with the patient and charge nurse.   Labs/tests ordered: none  Time spend 35 minutes.

## 2020-05-18 NOTE — Assessment & Plan Note (Signed)
Hydralazine for Sbp >170, Dbp>90

## 2020-05-18 NOTE — Assessment & Plan Note (Signed)
Syncope: outpatient cardiac monitor. Orthostasis vs micturition vs dehydration.              04/16/20-04/24/20 Syncope, noted orthostatic hypotension, treated with IVF, Cardiology arranged outpatient cardiac monitor. Suspected SIADH, placed on 1200 ml/day fluid restrictin.              04/06/20-04/11/20 Syncope with fall from commode with facial abrasion, suspected orthostatic syncope vs Ambien effect, LVEF 70-75%, mild AR and AS.              03/02/20 presyncope.

## 2020-05-18 NOTE — Progress Notes (Signed)
Location:    [facility] Room Number: 4 Place of Service:  SNF [phone]) Provider:  Veleta Miners MD  Virgie Dad, MD  Patient Care Team: Virgie Dad, MD as PCP - General (Internal Medicine) Donato Heinz, MD as PCP - Cardiology (Cardiology) Brien Few, MD as Consulting Physician (Obstetrics and Gynecology) Marti Sleigh, MD as Consulting Physician (Gynecology) Nancy Marus, MD as Consulting Physician (Gynecologic Oncology)  Extended Emergency Contact Information Primary Emergency Contact: [facility] Address: [address]          [place], [place] [address] [name] of [place] Phone: [phone] Mobile Phone: [phone] Relation: Daughter  Code Status:  Full Code Goals of care: Advanced Directive information Advanced Directives 05/18/2020  Does Patient Have a Medical Advance Directive? Yes  Type of Advance Directive Living will;Healthcare Power of Attorney  Does patient want to make changes to medical advance directive? No - Patient declined  Copy of [place] in Chart? Yes - validated most recent copy scanned in chart (See row information)  Would patient like information on creating a medical advance directive? -  Pre-existing out of facility DNR order (yellow form or pink MOST form) -     Chief Complaint  Patient presents with  . Acute Visit    HPI:  Pt is a 87 y.o. female seen today for an acute visit for follow up as requested by Families  Admitted in the hospital from 3/19-3/21 for hypotension and syncope Also in 3/7-3/16 for Syncope and alsofrom 2/25-3/2 after having syncope due to orthostatic hypotension During admission patient was found to be orthostatic.  All her antihypertensives were stopped.  She also was hyponatremic.  She is now on sodium tablets and fluid restriction.  Patient also had a cardiac monitor Placed.  Which has shown few episodes of atrial flutter.  She was  recently seen by cardiologist.  They have not started her on any beta-blocker or Eliquis because of her  risk of falling and  hypotension.  She continues to have monitored for another 2 weeks.  Syncope Patient has not had any more episodes since her last admission.  She does have a cardiac monitor in place.  She still continues to be very careful and cautious when she has to get up. Continues to TED hoses thigh-high.  Is working with therapy.  Wants to know if she can get a power chair so she can get little bit more independent around the facility. Hypertension After stopping her blood pressure medications patient started having high blood pressures.  SBP sometimes more than 180 and 190.  Using hydralazine 10 mg twice daily as needed. Hyponatremia Continues on sodium 3 times a day.  Last sodium was 132 Depression Daughter was concerned that patient was getting depressed.  But patient seems to be adjusting to the room.  They are planning to keep her in SNF  for now.      Past Medical History:  Diagnosis Date  . Anemia associated with acute blood loss 04/03/2011  . Arthritis    knees   . Ascites   . Blood transfusion    hx of 2011   . Complication of anesthesia    Sodium drops per pt   . Cystitis   . DIARRHEA, ANTIBIOTIC ASSOCIATED 05/31/2009   Qualifier: Diagnosis of  By: Tommy Medal MD, Roderic Scarce    . GERD (gastroesophageal reflux disease)   . H/O hiatal hernia   . Hyperlipidemia   . Hypertension   .  Hyponatremia   . Neutropenia with fever ([place]) 05/18/2011  . OSTEOMYELITIS, CHRONIC, LOWER LEG 04/23/2009   Qualifier: Diagnosis of  By: Johnnye Sima MD, Dellis Filbert    . Osteopenia   . OSTEOPOROSIS 04/23/2009   Qualifier: Diagnosis of  By: Johnnye Sima MD, Dellis Filbert    . Ovarian cancer ([place]) 01/25/2011  . Pneumonia    hx of   . Postmenopausal atrophic vaginitis   . PROSTHETIC JOINT COMPLICATION 5/40/9811   Qualifier: Diagnosis of  By: Tommy Medal MD, Roderic Scarce    . Renal disorder    Decreased kidney  function  . Staph infection 2010   after knee replacement  . Urinary frequency   . Vulvitis    Past Surgical History:  Procedure Laterality Date  . ABDOMINAL HYSTERECTOMY  04/01/2011   Procedure: HYSTERECTOMY ABDOMINAL;  Surgeon: Alvino Chapel, MD;  Location: [facility];  Service: Gynecology;  Laterality: N/A;  . APPENDECTOMY    . JOINT REPLACEMENT     R knee in 2008, 5 operations on L knee  . LAPAROTOMY  04/01/2011   Procedure: EXPLORATORY LAPAROTOMY;  Surgeon: Alvino Chapel, MD;  Location: [facility];  Service: Gynecology;  Laterality: N/A;  . OTHER SURGICAL HISTORY     hx of C section 1966  . SALPINGOOPHORECTOMY  04/01/2011   Procedure: SALPINGO OOPHERECTOMY;  Surgeon: Alvino Chapel, MD;  Location: [facility];  Service: Gynecology;  Laterality: Bilateral;  . TONSILLECTOMY      Allergies  Allergen Reactions  . Morphine And Related Anaphylaxis and Other (See Comments)    Given after knee replacement and code blue occurred  . Penicillins Hives, Itching and Other (See Comments)    Syncope, also  . Nsaids Other (See Comments)    Because of an abnormal kidney test result  . Shellfish Allergy Nausea And Vomiting and Other (See Comments)    Pt has shellfish allergy only.  Has had IV contrast x 2 and did fine.    Allergies as of 05/17/2020      Reactions   Morphine And Related Anaphylaxis, Other (See Comments)   Given after knee replacement and code blue occurred   Penicillins Hives, Itching, Other (See Comments)   Syncope, also   Nsaids Other (See Comments)   Because of an abnormal kidney test result   Shellfish Allergy Nausea And Vomiting, Other (See Comments)   Pt has shellfish allergy only.  Has had IV contrast x 2 and did fine.      Medication List       Accurate as of May 17, 2020 11:59 PM. If you have any questions, ask your nurse or doctor.        STOP taking these medications   magnesium oxide 400 MG tablet Commonly known as: MAG-OX     TAKE  these medications   acetaminophen 500 MG tablet Commonly known as: TYLENOL Take 2 tablets (1,000 mg total) by mouth 3 (three) times daily.   azelastine 0.1 % nasal spray Commonly known as: ASTELIN Place 1 spray into both nostrils daily as needed for rhinitis or allergies.   B-COMPLEX/FOLIC ACID/VITAMIN C PO Take 1 tablet by mouth daily with breakfast.   calcium carbonate 750 MG chewable tablet Commonly known as: TUMS EX Chew 750 mg by mouth daily as needed for heartburn.   cycloSPORINE 0.05 % ophthalmic emulsion Commonly known as: RESTASIS Place 1 drop into both eyes 2 (two) times daily.   diclofenac Sodium 1 % Gel Commonly known as: VOLTAREN Apply 2 g topically  4 (four) times daily.   docusate sodium 100 MG capsule Commonly known as: COLACE Take 100 mg by mouth daily as needed for mild constipation.   hydrALAZINE 10 MG tablet Commonly known as: APRESOLINE Take 10 mg by mouth daily as needed. PRN only if SBP more then 170 /90   lactose free nutrition Liqd Take 237 mLs by mouth 2 (two) times daily between meals.   meclizine 12.5 MG tablet Commonly known as: ANTIVERT Take 1 tablet (12.5 mg total) by mouth 2 (two) times daily as needed for dizziness.   melatonin 3 MG Tabs tablet Take 1 tablet (3 mg total) by mouth at bedtime.   pantoprazole 40 MG tablet Commonly known as: PROTONIX Take 1 tablet (40 mg total) by mouth 2 (two) times daily.   sodium chloride 1 g tablet Take 1 tablet (1 g total) by mouth 3 (three) times daily with meals.   Vitamin D3 50 MCG (2000 UT) Tabs Take 2,000 Units by mouth in the morning.   zinc oxide 20 % ointment Apply 1 application topically as needed (for redness- to be applied to buttocks/peri area and after every incontinent episode).       Review of Systems  Review of Systems  Constitutional: Negative for activity change, appetite change, chills, diaphoresis, fatigue and fever.  HENT: Negative for mouth sores, postnasal drip,  rhinorrhea, sinus pain and sore throat.   Respiratory: Negative for apnea, cough, chest tightness, shortness of breath and wheezing.   Cardiovascular: Negative for chest pain, palpitations and leg swelling.  Gastrointestinal: Negative for abdominal distention, abdominal pain, constipation, diarrhea, nausea and vomiting.  Genitourinary: Negative for dysuria and frequency.  Musculoskeletal: Negative for arthralgias, joint swelling and myalgias.  Skin: Negative for rash.  Neurological: Negative for dizziness, syncope, weakness, light-headedness and numbness.  Psychiatric/Behavioral: Negative for behavioral problems, confusion and sleep disturbance.     Immunization History  Administered Date(s) Administered  . Influenza Split 11/13/2012, 12/13/2012, 11/30/2013, 10/09/2014, 11/24/2018  . Influenza, High Dose Seasonal PF 12/01/2013, 10/04/2014, 10/12/2015, 11/06/2016, 11/12/2017  . Influenza,inj,Quad PF,6+ Mos 10/24/2010  . Influenza-Unspecified 10/24/2010  . Moderna Sars-Covid-2 Vaccination 02/14/2019, 03/14/2019, 12/26/2019  . Pneumococcal Conjugate-13 09/28/2013  . Pneumococcal Polysaccharide-23 03/05/2012  . Td 08/27/2000  . Tdap 08/27/2000, 10/16/2010  . Zoster 01/11/2013, 03/31/2020   Pertinent  Health Maintenance Due  Topic Date Due  . INFLUENZA VACCINE  09/10/2020  . DEXA SCAN  Completed  . PNA vac Low Risk Adult  Completed   Fall Risk  03/02/2014  Falls in the past year? No   Functional Status Survey:    Vitals:   05/18/20 1035  BP: (!) 177/98  Pulse: 86  Resp: 20  Temp: 98.2 F (36.8 C)  SpO2: 95%  Weight: 160 lb (72.6 kg)  Height: 5\' 3"  (1.6 m)   Body mass index is 28.34 kg/m. Physical Exam  Constitutional: Oriented to person, place, and time. Well-developed and well-nourished.  HENT:  Head: Normocephalic.  Mouth/Throat: Oropharynx is clear and moist.  Eyes: Pupils are equal, round, and reactive to light.  Neck: Neck supple.  Cardiovascular: Normal rate  and normal heart sounds.  No murmur heard. Pulmonary/Chest: Effort normal and breath sounds normal. No respiratory distress. No wheezes. She has no rales.  Abdominal: Soft. Bowel sounds are normal. No distension. There is no tenderness. There is no rebound.  Musculoskeletal: Mild edema.  Lymphadenopathy: none Neurological: Alert and oriented to person, place, and time.  Skin: Skin is warm and dry.  Psychiatric: Normal mood  and affect. Behavior is normal. Thought content normal.    Labs reviewed: Recent Labs    04/22/20 0109 04/23/20 0652 04/24/20 0058 04/28/20 1641 04/29/20 0521 04/30/20 0340 05/03/20 0000 05/10/20 0000 05/14/20 0000  NA 126* 128* 127* 128* 127* 130* 131* 133* 132*  K 4.1 4.2 4.3 5.2* 4.5 4.6 4.7 4.4 4.6  CL 98 96* 97* 97* 98 100 100 100 99  CO2 22 25 25  20* 22 24 24* 23* 24*  GLUCOSE 111* 142* 109* 160* 119* 97  --   --   --   BUN 16 17 23  30* 33* 29* 23* 23* 33*  CREATININE 0.83 0.97 0.97 1.16* 1.37* 1.06* 0.9 0.8 1.0  CALCIUM 8.4* 8.6* 8.3* 9.0 8.9 8.7* 9.1 9.0 9.2  MG 1.6* 1.6* 1.6*  --   --   --   --   --   --   PHOS 2.8 2.6 3.1  --   --  2.8  --   --   --    Recent Labs    04/22/20 0109 04/23/20 0652 04/24/20 0058 04/30/20 0340  AST 22 33 23  --   ALT 16 25 20   --   ALKPHOS 75 103 77  --   BILITOT 1.0 0.9 0.7  --   PROT 6.5 6.9 5.8*  --   ALBUMIN 2.5* 2.5* 2.2* 2.4*   Recent Labs    04/22/20 0109 04/23/20 0652 04/24/20 0058 04/28/20 1641 04/29/20 0521  WBC 8.4 6.7 7.1 12.2* 10.2  NEUTROABS 5.6 4.4 4.2  --   --   HGB 10.8* 11.9* 10.3* 14.6 12.0  HCT 32.5* 34.9* 31.0* 43.5 34.9*  MCV 84.9 83.9 84.9 85.1 83.5  PLT 322 336 332 441* 348   Lab Results  Component Value Date   TSH 2.357 04/06/2020   No results found for: HGBA1C No results found for: CHOL, HDL, LDLCALC, LDLDIRECT, TRIG, CHOLHDL  Significant Diagnostic Results in last 30 days:  No results found.  Assessment/Plan .Essential hypertension We will continue to use  hydralazine as needed due to her history of syncope on other antihypertensive. Wait for cardiology input Gastroesophageal reflux disease, unspecified whether esophagitis present On Protonix twice daily Primary insomnia Off her Ambien and just on melatonin Hyponatremia Continue sodium tablets 3 times a day FR 1500  Checking sodium every week Syncope and collapse ? Etiology Orthostatic Hypotension/atrial flutter/Hyponatremia Has a cardiac monitor Thigh-high TED hoses Working with therapy I also wrote for therapy to evaluate her for power chair  Atrial flutter, unspecified type ([place]) But the monitor Cardiology has not started her on beta-blocker or Anticoagulation due to risk of falls and Hypotension Continues to have monitor for another 2 weeks ? Depression By daughter patient was depressed few weeks ago but seems to have adjusted to the facility now.  I assured her that we will keep an monitor on her.  We will hold off of any new medication due to her history of hyponatremia   Family/ staff Communication:   Labs/tests ordered:   Total time spent in this patient care encounter was  45_  minutes; greater than 50% of the visit spent counseling patient and staff, reviewing records , Labs and coordinating care for problems addressed at this encounter.

## 2020-05-18 NOTE — Assessment & Plan Note (Signed)
Hyponatremia, stopped Furosemide-may be prn per nephrology,  started NaCl, 1500 fluid restriction. May consider SIADH workup, Nephrology consultation in hospital and may referral as outpatient. Na 132 05/14/20

## 2020-05-18 NOTE — Assessment & Plan Note (Addendum)
Hx of ovarian CA s/p TAH/BSO 03/2011, in remission. Diagnosed clinically and with cytology of ascitic fluid Dec 2012

## 2020-05-18 NOTE — Assessment & Plan Note (Signed)
,   takes Pantoprazole.

## 2020-05-18 NOTE — Assessment & Plan Note (Signed)
Mostly left

## 2020-05-30 ENCOUNTER — Encounter: Payer: Self-pay | Admitting: Orthopedic Surgery

## 2020-05-30 ENCOUNTER — Non-Acute Institutional Stay (SKILLED_NURSING_FACILITY): Payer: Medicare Other | Admitting: Orthopedic Surgery

## 2020-05-30 DIAGNOSIS — K219 Gastro-esophageal reflux disease without esophagitis: Secondary | ICD-10-CM

## 2020-05-30 DIAGNOSIS — I10 Essential (primary) hypertension: Secondary | ICD-10-CM | POA: Diagnosis not present

## 2020-05-30 DIAGNOSIS — K5901 Slow transit constipation: Secondary | ICD-10-CM

## 2020-05-30 DIAGNOSIS — I4892 Unspecified atrial flutter: Secondary | ICD-10-CM | POA: Diagnosis not present

## 2020-05-30 DIAGNOSIS — E871 Hypo-osmolality and hyponatremia: Secondary | ICD-10-CM

## 2020-05-30 DIAGNOSIS — R55 Syncope and collapse: Secondary | ICD-10-CM | POA: Diagnosis not present

## 2020-05-30 DIAGNOSIS — F5101 Primary insomnia: Secondary | ICD-10-CM

## 2020-05-30 NOTE — Progress Notes (Signed)
Location:   [facility] Room Number: 4 Place of Service:  SNF [phone]) Provider:  Windell Moulding, NP  Virgie Dad, MD  Patient Care Team: Virgie Dad, MD as PCP - General (Internal Medicine) Donato Heinz, MD as PCP - Cardiology (Cardiology) Brien Few, MD as Consulting Physician (Obstetrics and Gynecology) Marti Sleigh, MD as Consulting Physician (Gynecology) Nancy Marus, MD as Consulting Physician (Gynecologic Oncology)  Extended Emergency Contact Information Primary Emergency Contact: [facility] Address: [address]          [place], [place] [address] [name] of [place] Phone: [phone] Mobile Phone: [phone] Relation: Daughter  Code Status:  FULL CODE Goals of care: Advanced Directive information Advanced Directives 05/30/2020  Does Patient Have a Medical Advance Directive? Yes  Type of Paramedic of [place];Living will  Does patient want to make changes to medical advance directive? No - Patient declined  Copy of [place] in Chart? Yes - validated most recent copy scanned in chart (See row information)  Would patient like information on creating a medical advance directive? -  Pre-existing out of facility DNR order (yellow form or pink MOST form) -     Chief Complaint  Patient presents with  . Medical Management of Chronic Issues    Routine follow up visit.    HPI:  Pt is a 87 y.o. female seen today for medical management of chronic diseases.    Today, she is sitting in her recliner during encounter. Alert and oriented x 3, follows commands and can express needs. She has has been hospitalized for syncope twice in the last 8 weeks. Cardiology f/u scheduled for 05/13, wearing zio patch today. Reports some dizziness, but not with movement. Reports feeling like the room is spinning. Ambulates short distances with rolator and longer distances with wheelchair.  Right leg brace to lower leg. Wearing compression stockings daily. Denies chest pain or sob.   No recent falls or injuries.   Recent weights are as follows:  04/13- 160.4 lbs  03/30- 160 lbs  03/21- 160 lbs  Recent blood pressures are as follows:  04/19- 135/62, 150/81  04/18- 169/86, 157/86  04/17- 159/68, 172/90  Nurse does not report any concerns, vitals stable.   Past Medical History:  Diagnosis Date  . Anemia associated with acute blood loss 04/03/2011  . Arthritis    knees   . Ascites   . Blood transfusion    hx of 2011   . Complication of anesthesia    Sodium drops per pt   . Cystitis   . DIARRHEA, ANTIBIOTIC ASSOCIATED 05/31/2009   Qualifier: Diagnosis of  By: Tommy Medal MD, Roderic Scarce    . GERD (gastroesophageal reflux disease)   . H/O hiatal hernia   . Hyperlipidemia   . Hypertension   . Hyponatremia   . Neutropenia with fever ([place]) 05/18/2011  . OSTEOMYELITIS, CHRONIC, LOWER LEG 04/23/2009   Qualifier: Diagnosis of  By: Johnnye Sima MD, Dellis Filbert    . Osteopenia   . OSTEOPOROSIS 04/23/2009   Qualifier: Diagnosis of  By: Johnnye Sima MD, Dellis Filbert    . Ovarian cancer ([place]) 01/25/2011  . Pneumonia    hx of   . Postmenopausal atrophic vaginitis   . PROSTHETIC JOINT COMPLICATION 0/86/5784   Qualifier: Diagnosis of  By: Tommy Medal MD, Roderic Scarce    . Renal disorder    Decreased kidney function  . Staph infection 2010   after knee replacement  . Urinary frequency   .  Vulvitis    Past Surgical History:  Procedure Laterality Date  . ABDOMINAL HYSTERECTOMY  04/01/2011   Procedure: HYSTERECTOMY ABDOMINAL;  Surgeon: Alvino Chapel, MD;  Location: [facility];  Service: Gynecology;  Laterality: N/A;  . APPENDECTOMY    . JOINT REPLACEMENT     R knee in 2008, 5 operations on L knee  . LAPAROTOMY  04/01/2011   Procedure: EXPLORATORY LAPAROTOMY;  Surgeon: Alvino Chapel, MD;  Location: [facility];  Service: Gynecology;  Laterality: N/A;  . OTHER SURGICAL HISTORY     hx of C  section 1966  . SALPINGOOPHORECTOMY  04/01/2011   Procedure: SALPINGO OOPHERECTOMY;  Surgeon: Alvino Chapel, MD;  Location: [facility];  Service: Gynecology;  Laterality: Bilateral;  . TONSILLECTOMY      Allergies  Allergen Reactions  . Morphine And Related Anaphylaxis and Other (See Comments)    Given after knee replacement and code blue occurred  . Penicillins Hives, Itching and Other (See Comments)    Syncope, also  . Nsaids Other (See Comments)    Because of an abnormal kidney test result  . Shellfish Allergy Nausea And Vomiting and Other (See Comments)    Pt has shellfish allergy only.  Has had IV contrast x 2 and did fine.    Allergies as of 05/30/2020      Reactions   Morphine And Related Anaphylaxis, Other (See Comments)   Given after knee replacement and code blue occurred   Penicillins Hives, Itching, Other (See Comments)   Syncope, also   Nsaids Other (See Comments)   Because of an abnormal kidney test result   Shellfish Allergy Nausea And Vomiting, Other (See Comments)   Pt has shellfish allergy only.  Has had IV contrast x 2 and did fine.      Medication List       Accurate as of May 30, 2020  1:41 PM. If you have any questions, ask your nurse or doctor.        acetaminophen 500 MG tablet Commonly known as: TYLENOL Take 2 tablets (1,000 mg total) by mouth 3 (three) times daily.   azelastine 0.1 % nasal spray Commonly known as: ASTELIN Place 1 spray into both nostrils daily as needed for rhinitis or allergies.   B-COMPLEX/FOLIC ACID/VITAMIN C PO Take 1 tablet by mouth daily with breakfast.   calcium carbonate 750 MG chewable tablet Commonly known as: TUMS EX Chew 750 mg by mouth daily as needed for heartburn.   cycloSPORINE 0.05 % ophthalmic emulsion Commonly known as: RESTASIS Place 1 drop into both eyes 2 (two) times daily.   diclofenac Sodium 1 % Gel Commonly known as: VOLTAREN Apply 2 g topically 4 (four) times daily.   docusate sodium  100 MG capsule Commonly known as: COLACE Take 100 mg by mouth daily as needed for mild constipation.   Ensure Take 237 mLs by mouth. 22mLS., oral, Twice A Day, Give Ensure 237MLs/Boost 237MLS Strawberry Liquid oral per family request W/Breakfast and 2PM to increase Nutritional adequacy. Doc.% consumed. Family will provide. What changed: Another medication with the same name was removed. Continue taking this medication, and follow the directions you see here. Changed by: Yvonna Alanis, NP   hydrALAZINE 10 MG tablet Commonly known as: APRESOLINE Take 10 mg by mouth daily as needed. PRN only if SBP more then 170 /90   meclizine 12.5 MG tablet Commonly known as: ANTIVERT Take 1 tablet (12.5 mg total) by mouth 2 (two) times daily as needed  for dizziness.   melatonin 3 MG Tabs tablet Take 1 tablet (3 mg total) by mouth at bedtime.   pantoprazole 40 MG tablet Commonly known as: PROTONIX Take 1 tablet (40 mg total) by mouth 2 (two) times daily.   sodium chloride 1 g tablet Take 1 tablet (1 g total) by mouth 3 (three) times daily with meals.   Vitamin D3 50 MCG (2000 UT) Tabs Take 2,000 Units by mouth in the morning.   zinc oxide 20 % ointment Apply 1 application topically as needed (for redness- to be applied to buttocks/peri area and after every incontinent episode).       Review of Systems  Constitutional: Negative for activity change, appetite change, fatigue and fever.  HENT: Positive for hearing loss. Negative for dental problem and trouble swallowing.        Bilateral hearing aids  Eyes: Positive for visual disturbance.       Glasses  Respiratory: Negative for cough, shortness of breath and wheezing.   Cardiovascular: Negative for chest pain and leg swelling.  Gastrointestinal: Negative for abdominal distention, abdominal pain, constipation, diarrhea and nausea.  Genitourinary: Positive for frequency. Negative for dysuria and hematuria.  Musculoskeletal: Positive for  arthralgias and myalgias.       Right leg brace  Skin:       Dry skin  Neurological: Positive for dizziness, weakness and light-headedness. Negative for headaches.  Psychiatric/Behavioral: Negative for confusion and dysphoric mood. The patient is not nervous/anxious.     Immunization History  Administered Date(s) Administered  . Influenza Split 11/13/2012, 12/13/2012, 11/30/2013, 10/09/2014, 11/24/2018  . Influenza, High Dose Seasonal PF 12/01/2013, 10/04/2014, 10/12/2015, 11/06/2016, 11/12/2017  . Influenza,inj,Quad PF,6+ Mos 10/24/2010  . Influenza-Unspecified 10/24/2010  . Moderna Sars-Covid-2 Vaccination 02/14/2019, 03/14/2019, 12/26/2019  . Pneumococcal Conjugate-13 09/28/2013  . Pneumococcal Polysaccharide-23 03/05/2012  . Td 08/27/2000  . Tdap 08/27/2000, 10/16/2010  . Zoster 01/11/2013, 03/31/2020   Pertinent  Health Maintenance Due  Topic Date Due  . INFLUENZA VACCINE  09/10/2020  . DEXA SCAN  Completed  . PNA vac Low Risk Adult  Completed   Fall Risk  03/02/2014  Falls in the past year? No   Functional Status Survey:    Vitals:   05/30/20 1315  BP: 135/62  Pulse: 73  Resp: 20  Temp: 97.8 F (36.6 C)  SpO2: 98%  Weight: 160 lb 6.4 oz (72.8 kg)  Height: 5\' 3"  (1.6 m)   Body mass index is 28.41 kg/m. Physical Exam Vitals reviewed.  Constitutional:      General: She is not in acute distress. HENT:     Head: Normocephalic.     Right Ear: There is no impacted cerumen.     Left Ear: There is no impacted cerumen.     Nose: Nose normal.     Mouth/Throat:     Mouth: Mucous membranes are moist.  Eyes:     General:        Right eye: No discharge.        Left eye: No discharge.  Cardiovascular:     Rate and Rhythm: Normal rate and regular rhythm.     Pulses: Normal pulses.     Heart sounds: Murmur heard.    Pulmonary:     Effort: Pulmonary effort is normal. No respiratory distress.     Breath sounds: Normal breath sounds. No wheezing.  Abdominal:      General: Bowel sounds are normal. There is no distension.     Palpations: Abdomen  is soft.     Tenderness: There is no abdominal tenderness.     Hernia: A hernia is present.  Musculoskeletal:     Right lower leg: No edema.     Left lower leg: No edema.  Skin:    General: Skin is warm and dry.     Capillary Refill: Capillary refill takes less than 2 seconds.  Neurological:     General: No focal deficit present.     Mental Status: She is alert and oriented to person, place, and time.     Motor: Weakness present.     Gait: Gait abnormal.     Comments: rolator/wheelchair  Psychiatric:        Mood and Affect: Mood normal.        Behavior: Behavior normal.     Labs reviewed: Recent Labs    04/22/20 0109 04/23/20 0652 04/24/20 0058 04/28/20 1641 04/29/20 0521 04/30/20 0340 05/03/20 0000 05/10/20 0000 05/14/20 0000  NA 126* 128* 127* 128* 127* 130* 131* 133* 132*  K 4.1 4.2 4.3 5.2* 4.5 4.6 4.7 4.4 4.6  CL 98 96* 97* 97* 98 100 100 100 99  CO2 22 25 25  20* 22 24 24* 23* 24*  GLUCOSE 111* 142* 109* 160* 119* 97  --   --   --   BUN 16 17 23  30* 33* 29* 23* 23* 33*  CREATININE 0.83 0.97 0.97 1.16* 1.37* 1.06* 0.9 0.8 1.0  CALCIUM 8.4* 8.6* 8.3* 9.0 8.9 8.7* 9.1 9.0 9.2  MG 1.6* 1.6* 1.6*  --   --   --   --   --   --   PHOS 2.8 2.6 3.1  --   --  2.8  --   --   --    Recent Labs    04/22/20 0109 04/23/20 0652 04/24/20 0058 04/30/20 0340  AST 22 33 23  --   ALT 16 25 20   --   ALKPHOS 75 103 77  --   BILITOT 1.0 0.9 0.7  --   PROT 6.5 6.9 5.8*  --   ALBUMIN 2.5* 2.5* 2.2* 2.4*   Recent Labs    04/22/20 0109 04/23/20 0652 04/24/20 0058 04/28/20 1641 04/29/20 0521  WBC 8.4 6.7 7.1 12.2* 10.2  NEUTROABS 5.6 4.4 4.2  --   --   HGB 10.8* 11.9* 10.3* 14.6 12.0  HCT 32.5* 34.9* 31.0* 43.5 34.9*  MCV 84.9 83.9 84.9 85.1 83.5  PLT 322 336 332 441* 348   Lab Results  Component Value Date   TSH 2.357 04/06/2020   No results found for: HGBA1C No results found for:  CHOL, HDL, LDLCALC, LDLDIRECT, TRIG, CHOLHDL  Significant Diagnostic Results in last 30 days:  No results found.  Assessment/Plan 1. Essential hypertension - bp remains elevavated > 150/90 - prn hydralizine > 170/90 - cont heart healthy diet  2. Gastroesophageal reflux disease, unspecified whether esophagitis present - stable with protonix  3. Primary insomnia - stable with melatonin  4. Hyponatremia - NA 135 on 05/17/2020 - cont sodium tablets 1 g tid  5. Syncope and collapse - no recent episodes, complains of dizziness  6. Atrial flutter, unspecified type ([place]) - followed by cardiology- f/u 05/13 - cont zio patch  7. Slow transit constipation - abdomen soft, last BM 04/20 - cont colace regimen   Family/ staff Communication: plan discussed with patient and nurse  Labs/tests ordered: none

## 2020-06-11 LAB — BASIC METABOLIC PANEL
BUN: 21 (ref 4–21)
CO2: 28 — AB (ref 13–22)
Chloride: 99 (ref 99–108)
Creatinine: 0.9 (ref 0.5–1.1)
Glucose: 105
Potassium: 4.5 (ref 3.4–5.3)
Sodium: 135 — AB (ref 137–147)

## 2020-06-11 LAB — HEPATIC FUNCTION PANEL
ALT: 14 (ref 7–35)
AST: 26 (ref 13–35)
Alkaline Phosphatase: 86 (ref 25–125)
Bilirubin, Total: 0.5

## 2020-06-11 LAB — COMPREHENSIVE METABOLIC PANEL
Albumin: 3.4 — AB (ref 3.5–5.0)
Calcium: 9.6 (ref 8.7–10.7)
Globulin: 3.7

## 2020-06-21 NOTE — Progress Notes (Deleted)
Cardiology Office Note:    Date:  06/21/2020   ID:  [name], DOB February 25, 1929, MRN [id number]  PCP:  Virgie Dad, MD  Cardiologist:  Donato Heinz, MD  Electrophysiologist:  None   Referring MD: Virgie Dad, MD   No chief complaint on file.   History of Present Illness:    [name] is a 87 y.o. female with a hx of atrial fibrillation, aortic stenosis, ovarian cancer, CKD, hyperlipidemia, hypertension, obesity who presents for follow-up.  She was referred by Dr. Rex Kras for evaluation of heart murmur, initially seen on 12/30/2018.  Echocardiogram on 01/04/2019 showed LVEF 60 to 41%, grade 1 diastolic dysfunction, normal RV function, mild aortic stenosis.    She was hospitalized in February 2022 with syncope thought to be due to orthostasis.  Ambien, Norvasc, nadolol were discontinued.  Echocardiogram showed no structural heart disease.  She was discharged with heart monitor.  30-day monitor on 06/04/2020 showed 4% A. fib/flutter burden with average rate 134 bpm and one 7 beat run of NSVT.  She was seen by Doreene Adas, PA on 05/07/2020 given new diagnosis atrial fibrillation/flutter.  She has been having issues with dizziness/orthostasis and frequent falls, and decision was made to hold off on anticoagulation.  Since last clinic visit,   she reports has been doing well.  Denies any chest pain, dyspnea, lightheadedness, syncope, lower extremity edema.  Reports rare palpitations, has been lasting for 1 second or so.  Has not been checking BP.    Past Medical History:  Diagnosis Date  . Anemia associated with acute blood loss 04/03/2011  . Arthritis    knees   . Ascites   . Blood transfusion    hx of 2011   . Complication of anesthesia    Sodium drops per pt   . Cystitis   . DIARRHEA, ANTIBIOTIC ASSOCIATED 05/31/2009   Qualifier: Diagnosis of  By: Tommy Medal MD, Roderic Scarce    . GERD (gastroesophageal reflux disease)   . H/O hiatal hernia   . Hyperlipidemia    . Hypertension   . Hyponatremia   . Neutropenia with fever ([place]) 05/18/2011  . OSTEOMYELITIS, CHRONIC, LOWER LEG 04/23/2009   Qualifier: Diagnosis of  By: Johnnye Sima MD, Dellis Filbert    . Osteopenia   . OSTEOPOROSIS 04/23/2009   Qualifier: Diagnosis of  By: Johnnye Sima MD, Dellis Filbert    . Ovarian cancer ([place]) 01/25/2011  . Pneumonia    hx of   . Postmenopausal atrophic vaginitis   . PROSTHETIC JOINT COMPLICATION 05/03/4008   Qualifier: Diagnosis of  By: Tommy Medal MD, Roderic Scarce    . Renal disorder    Decreased kidney function  . Staph infection 2010   after knee replacement  . Urinary frequency   . Vulvitis     Past Surgical History:  Procedure Laterality Date  . ABDOMINAL HYSTERECTOMY  04/01/2011   Procedure: HYSTERECTOMY ABDOMINAL;  Surgeon: Alvino Chapel, MD;  Location: [facility];  Service: Gynecology;  Laterality: N/A;  . APPENDECTOMY    . JOINT REPLACEMENT     R knee in 2008, 5 operations on L knee  . LAPAROTOMY  04/01/2011   Procedure: EXPLORATORY LAPAROTOMY;  Surgeon: Alvino Chapel, MD;  Location: [facility];  Service: Gynecology;  Laterality: N/A;  . OTHER SURGICAL HISTORY     hx of C section 1966  . SALPINGOOPHORECTOMY  04/01/2011   Procedure: SALPINGO OOPHERECTOMY;  Surgeon: Alvino Chapel, MD;  Location: [facility];  Service: Gynecology;  Laterality: Bilateral;  . TONSILLECTOMY      Current Medications: No outpatient medications have been marked as taking for the 06/22/20 encounter (Appointment) with Donato Heinz, MD.     Allergies:   Morphine and related, Penicillins, Nsaids, and Shellfish allergy   Social History   Socioeconomic History  . Marital status: Widowed    Spouse name: Not on file  . Number of children: Not on file  . Years of education: Not on file  . Highest education level: Not on file  Occupational History  . Not on file  Tobacco Use  . Smoking status: Former Smoker    Quit date: 02/11/1952    Years since quitting: 68.4  .  Smokeless tobacco: Never Used  Vaping Use  . Vaping Use: Never used  Substance and Sexual Activity  . Alcohol use: Not Currently  . Drug use: Never  . Sexual activity: Never  Other Topics Concern  . Not on file  Social History Narrative  . Not on file   Social Determinants of Health   Financial Resource Strain: Not on file  Food Insecurity: Not on file  Transportation Needs: Not on file  Physical Activity: Not on file  Stress: Not on file  Social Connections: Not on file     Family History: The patient's family history includes Cancer in an other family member; Diabetes in her brother; Heart attack in her brother.  ROS:   Please see the history of present illness.    All other systems reviewed and are negative.  EKGs/Labs/Other Studies Reviewed:    The following studies were reviewed today:   EKG:  EKG is ordered today.  The ekg ordered today demonstrates normal sinus rhythm, rate 73, low voltage in precordial leads, no ST/T abnormalities  Recent Labs: 04/06/2020: TSH 2.357 04/24/2020: ALT 20; Magnesium 1.6 04/29/2020: Hemoglobin 12.0; Platelets 348 05/14/2020: BUN 33; Creatinine 1.0; Potassium 4.6; Sodium 132  Recent Lipid Panel No results found for: CHOL, TRIG, HDL, CHOLHDL, VLDL, LDLCALC, LDLDIRECT  Physical Exam:    VS:  There were no vitals taken for this visit.    Wt Readings from Last 3 Encounters:  05/30/20 160 lb 6.4 oz (72.8 kg)  05/18/20 160 lb (72.6 kg)  05/16/20 160 lb (72.6 kg)     GEN:  in no acute distress HEENT: Normal NECK: No JVD CARDIAC: RRR, 2/6 systolic murmur RESPIRATORY:  Clear to auscultation without rales, wheezing or rhonchi  ABDOMEN: Soft, non-tender, non-distended MUSCULOSKELETAL: No edema SKIN: Warm and dry NEUROLOGIC:  Alert and oriented x 3 PSYCHIATRIC:  Normal affect   ASSESSMENT:    No diagnosis found. PLAN:    Paroxysmal atrial fibrillation: 30-day monitor on 06/04/2020 showed 4% A. fib/flutter burden with average rate  134 bpm and one 7 beat run of NSVT.  CHA2DS2-VASc score 4 (hypertension, age x2, female).  Was not started on anticoagulation due to frequent falls.  Aortic stenosis: Mild on echo 12/2018 (MG 13 mmHg, AVA 1.6 cm).  Will monitor with repeat echo next year  Hypertension: On nadolol 20 mg daily, amlodipine 5 mg daily, lisinopril 20 mg daily.  BP elevated, will increase amlodipine to 10 mg daily.  Asked patient to check BP daily for next 2 weeks and call with results  RTC in***  Medication Adjustments/Labs and Tests Ordered: Current medicines are reviewed at length with the patient today.  Concerns regarding medicines are outlined above.  No orders of the defined types were placed in this encounter.  No  orders of the defined types were placed in this encounter.   There are no Patient Instructions on file for this visit.   Signed, Donato Heinz, MD  06/21/2020 10:59 PM    [facility]

## 2020-06-22 ENCOUNTER — Non-Acute Institutional Stay (SKILLED_NURSING_FACILITY): Payer: Medicare Other | Admitting: Orthopedic Surgery

## 2020-06-22 ENCOUNTER — Encounter: Payer: Self-pay | Admitting: Cardiology

## 2020-06-22 ENCOUNTER — Other Ambulatory Visit: Payer: Self-pay

## 2020-06-22 ENCOUNTER — Ambulatory Visit (INDEPENDENT_AMBULATORY_CARE_PROVIDER_SITE_OTHER): Payer: Medicare Other | Admitting: Cardiology

## 2020-06-22 ENCOUNTER — Encounter: Payer: Self-pay | Admitting: Orthopedic Surgery

## 2020-06-22 VITALS — BP 122/78 | HR 94 | Ht 63.0 in

## 2020-06-22 DIAGNOSIS — K219 Gastro-esophageal reflux disease without esophagitis: Secondary | ICD-10-CM | POA: Diagnosis not present

## 2020-06-22 DIAGNOSIS — F5101 Primary insomnia: Secondary | ICD-10-CM | POA: Diagnosis not present

## 2020-06-22 DIAGNOSIS — I480 Paroxysmal atrial fibrillation: Secondary | ICD-10-CM | POA: Diagnosis not present

## 2020-06-22 DIAGNOSIS — E871 Hypo-osmolality and hyponatremia: Secondary | ICD-10-CM

## 2020-06-22 DIAGNOSIS — G8929 Other chronic pain: Secondary | ICD-10-CM | POA: Diagnosis not present

## 2020-06-22 DIAGNOSIS — K5901 Slow transit constipation: Secondary | ICD-10-CM

## 2020-06-22 DIAGNOSIS — R55 Syncope and collapse: Secondary | ICD-10-CM | POA: Diagnosis not present

## 2020-06-22 DIAGNOSIS — I10 Essential (primary) hypertension: Secondary | ICD-10-CM

## 2020-06-22 DIAGNOSIS — I350 Nonrheumatic aortic (valve) stenosis: Secondary | ICD-10-CM | POA: Diagnosis not present

## 2020-06-22 DIAGNOSIS — N1831 Chronic kidney disease, stage 3a: Secondary | ICD-10-CM | POA: Diagnosis not present

## 2020-06-22 DIAGNOSIS — M25571 Pain in right ankle and joints of right foot: Secondary | ICD-10-CM

## 2020-06-22 NOTE — Progress Notes (Signed)
Location:  [facility] Room Number: 4 Place of Service:  SNF [phone]) Provider: Windell Moulding, AGNP-C  Virgie Dad, MD  Patient Care Team: Virgie Dad, MD as PCP - General (Internal Medicine) Donato Heinz, MD as PCP - Cardiology (Cardiology) Brien Few, MD as Consulting Physician (Obstetrics and Gynecology) Marti Sleigh, MD as Consulting Physician (Gynecology) Nancy Marus, MD as Consulting Physician (Gynecologic Oncology)  Extended Emergency Contact Information Primary Emergency Contact: [facility] Address: [address]          [place], [place] [address] [name] of [place] Phone: [phone] Mobile Phone: [phone] Relation: Daughter  Code Status:  Full code Goals of care: Advanced Directive information Advanced Directives 05/30/2020  Does Patient Have a Medical Advance Directive? Yes  Type of Paramedic of [place];Living will  Does patient want to make changes to medical advance directive? No - Patient declined  Copy of [place] in Chart? Yes - validated most recent copy scanned in chart (See row information)  Would patient like information on creating a medical advance directive? -  Pre-existing out of facility DNR order (yellow form or pink MOST form) -     Chief Complaint  Patient presents with  . Medical Management of Chronic Issues    HPI:  Pt is a 87 y.o. female seen today for medical management of chronic diseases.    She continues to reside on the skilled nursing unit at [facility] since 04/11/2020. Overall she has accepted the move well. Daughter and son continue to be active in her life and she is very happy. 03/19- 03/21 she was hospitalized for syncope and hyponatremia. She was discharged with sodium tablets TID, 1.2L fluid restriction, and taken off her bp meds and lasix. She has been suspected to have SIADH, possible workup suggested in  future if hyponatremia persists. Sodium level 04/07- 135, 05/06- 135. Cardiology follow up advised for orthostatic hypotension and outpatient cardiac monitoring. Monitor detected periods of a-flutter. She plans to see cardiology later today. No recent falls, injuries or syncopal episodes.   Recent blood pressures are as follows:  5/12- 145/79, 140/84  05/11- 127/77, 149/68  05/10- 167/79, 166/86  05/19- 157/85, 158/90 Hydralazine 10 mg given 06/17/2020 for BP 182/82.   History of ovarian cancer s/p TAH/BSO in 2013, remains in remission.  Constipation controlled with prn colace. Last BM 05/11.  Osteoporosis she receives daily calcium/vit d and prolia injections. GERD stable with daily Protonix.  Insomnia stable, Ambien discontinued 04/11/2020.    Daughter tested positive for covid-19 this week. Kailyne last saw her daughter 05/11. She has been tested for covid-19 daily with all tests negative. Today's test negative. She remains asymptomatic, vitals stable.   She reports some right ankle pain. Pain near heel and medial malleolus, rated 3/10. She reports using orthotics in the past, she has not used them since being moved to snf. Asking for staff to look in her closet. She plans to ask daughter as well.   Recent weights are as follows:  05/03- 165.4 lbs  04/06- 160 lbs   03/02- 167.5 lbs  Nurse does not reprt any concerns. Vitals stable.          Past Medical History:  Diagnosis Date  . Anemia associated with acute blood loss 04/03/2011  . Arthritis    knees   . Ascites   . Blood transfusion    hx of 2011   . Complication of anesthesia  Sodium drops per pt   . Cystitis   . DIARRHEA, ANTIBIOTIC ASSOCIATED 05/31/2009   Qualifier: Diagnosis of  By: Tommy Medal MD, Roderic Scarce    . GERD (gastroesophageal reflux disease)   . H/O hiatal hernia   . Hyperlipidemia   . Hypertension   . Hyponatremia   . Neutropenia with fever ([place]) 05/18/2011  . OSTEOMYELITIS, CHRONIC, LOWER LEG  04/23/2009   Qualifier: Diagnosis of  By: Johnnye Sima MD, Dellis Filbert    . Osteopenia   . OSTEOPOROSIS 04/23/2009   Qualifier: Diagnosis of  By: Johnnye Sima MD, Dellis Filbert    . Ovarian cancer ([place]) 01/25/2011  . Pneumonia    hx of   . Postmenopausal atrophic vaginitis   . PROSTHETIC JOINT COMPLICATION 9/52/8413   Qualifier: Diagnosis of  By: Tommy Medal MD, Roderic Scarce    . Renal disorder    Decreased kidney function  . Staph infection 2010   after knee replacement  . Urinary frequency   . Vulvitis    Past Surgical History:  Procedure Laterality Date  . ABDOMINAL HYSTERECTOMY  04/01/2011   Procedure: HYSTERECTOMY ABDOMINAL;  Surgeon: Alvino Chapel, MD;  Location: [facility] ORS;  Service: Gynecology;  Laterality: N/A;  . APPENDECTOMY    . JOINT REPLACEMENT     R knee in 2008, 5 operations on L knee  . LAPAROTOMY  04/01/2011   Procedure: EXPLORATORY LAPAROTOMY;  Surgeon: Alvino Chapel, MD;  Location: [facility];  Service: Gynecology;  Laterality: N/A;  . OTHER SURGICAL HISTORY     hx of C section 1966  . SALPINGOOPHORECTOMY  04/01/2011   Procedure: SALPINGO OOPHERECTOMY;  Surgeon: Alvino Chapel, MD;  Location: [facility];  Service: Gynecology;  Laterality: Bilateral;  . TONSILLECTOMY      Allergies  Allergen Reactions  . Morphine And Related Anaphylaxis and Other (See Comments)    Given after knee replacement and code blue occurred  . Penicillins Hives, Itching and Other (See Comments)    Syncope, also  . Nsaids Other (See Comments)    Because of an abnormal kidney test result  . Shellfish Allergy Nausea And Vomiting and Other (See Comments)    Pt has shellfish allergy only.  Has had IV contrast x 2 and did fine.    Outpatient Encounter Medications as of 06/22/2020  Medication Sig  . acetaminophen (TYLENOL) 500 MG tablet Take 2 tablets (1,000 mg total) by mouth 3 (three) times daily.  Marland Kitchen azelastine (ASTELIN) 0.1 % nasal spray Place 1 spray into both nostrils daily as needed for  rhinitis or allergies.  . B Complex-C-Folic Acid (B-COMPLEX/FOLIC ACID/VITAMIN C PO) Take 1 tablet by mouth daily with breakfast.  . calcium carbonate (TUMS EX) 750 MG chewable tablet Chew 750 mg by mouth daily as needed for heartburn.  . Cholecalciferol (VITAMIN D3) 50 MCG (2000 UT) TABS Take 2,000 Units by mouth in the morning.  . cycloSPORINE (RESTASIS) 0.05 % ophthalmic emulsion Place 1 drop into both eyes 2 (two) times daily.  . diclofenac Sodium (VOLTAREN) 1 % GEL Apply 2 g topically 4 (four) times daily.  Marland Kitchen docusate sodium (COLACE) 100 MG capsule Take 100 mg by mouth daily as needed for mild constipation.  . Ensure (ENSURE) Take 237 mLs by mouth. 279mLS., oral, Twice A Day, Give Ensure 237MLs/Boost 237MLS Strawberry Liquid oral per family request W/Breakfast and 2PM to increase Nutritional adequacy. Doc.% consumed. Family will provide.  . hydrALAZINE (APRESOLINE) 10 MG tablet Take 10 mg by mouth daily as needed. PRN only if  SBP more then 170 /90  . meclizine (ANTIVERT) 12.5 MG tablet Take 1 tablet (12.5 mg total) by mouth 2 (two) times daily as needed for dizziness.  . melatonin 3 MG TABS tablet Take 1 tablet (3 mg total) by mouth at bedtime.  . pantoprazole (PROTONIX) 40 MG tablet Take 1 tablet (40 mg total) by mouth 2 (two) times daily.  . sodium chloride 1 g tablet Take 1 tablet (1 g total) by mouth 3 (three) times daily with meals.  . zinc oxide 20 % ointment Apply 1 application topically as needed (for redness- to be applied to buttocks/peri area and after every incontinent episode).   No facility-administered encounter medications on file as of 06/22/2020.    Review of Systems  Constitutional: Negative for activity change, appetite change, fatigue and fever.  HENT: Positive for hearing loss. Negative for congestion, dental problem, sore throat and trouble swallowing.        HOH  Eyes: Negative for discharge, itching and visual disturbance.  Respiratory: Negative for cough,  shortness of breath and wheezing.   Cardiovascular: Negative for chest pain and leg swelling.  Gastrointestinal: Negative for abdominal distention, abdominal pain, constipation, diarrhea and nausea.  Genitourinary: Negative for dysuria, frequency, hematuria and vaginal bleeding.  Musculoskeletal: Positive for arthralgias, gait problem and myalgias.  Skin: Negative.   Neurological: Positive for weakness. Negative for dizziness, syncope and headaches.  Psychiatric/Behavioral: Negative for confusion, dysphoric mood and sleep disturbance. The patient is not nervous/anxious.     Immunization History  Administered Date(s) Administered  . Influenza Split 11/13/2012, 12/13/2012, 11/30/2013, 10/09/2014, 11/24/2018  . Influenza, High Dose Seasonal PF 12/01/2013, 10/04/2014, 10/12/2015, 11/06/2016, 11/12/2017  . Influenza,inj,Quad PF,6+ Mos 10/24/2010  . Influenza-Unspecified 10/24/2010  . Moderna Sars-Covid-2 Vaccination 02/14/2019, 03/14/2019, 12/26/2019  . Pneumococcal Conjugate-13 09/28/2013  . Pneumococcal Polysaccharide-23 03/05/2012  . Td 08/27/2000  . Tdap 08/27/2000, 10/16/2010  . Zoster 01/11/2013, 03/31/2020   Pertinent  Health Maintenance Due  Topic Date Due  . INFLUENZA VACCINE  09/10/2020  . DEXA SCAN  Completed  . PNA vac Low Risk Adult  Completed   Fall Risk  03/02/2014  Falls in the past year? No   Functional Status Survey:    Vitals:   06/22/20 1203  BP: (!) 145/79  Pulse: 86  Resp: (!) 24  Temp: 97.7 F (36.5 C)  SpO2: 95%  Weight: 165 lb 6.4 oz (75 kg)  Height: 5\' 3"  (1.6 m)   Body mass index is 29.3 kg/m. Physical Exam Vitals reviewed.  Constitutional:      General: She is not in acute distress. HENT:     Head: Normocephalic.     Right Ear: There is no impacted cerumen.     Left Ear: There is no impacted cerumen.     Nose: Nose normal.     Mouth/Throat:     Mouth: Mucous membranes are moist.     Pharynx: No posterior oropharyngeal erythema.  Eyes:      General:        Right eye: No discharge.        Left eye: No discharge.  Neck:     Vascular: No carotid bruit.  Cardiovascular:     Rate and Rhythm: Normal rate and regular rhythm.     Pulses: Normal pulses.     Heart sounds: Normal heart sounds. No murmur heard.   Pulmonary:     Effort: Pulmonary effort is normal. No respiratory distress.     Breath sounds: Normal breath  sounds. No wheezing.  Abdominal:     General: Bowel sounds are normal. There is no distension.     Palpations: Abdomen is soft.     Tenderness: There is no abdominal tenderness.  Musculoskeletal:     Cervical back: Normal range of motion.     Right lower leg: No edema.     Left lower leg: No edema.     Comments: Right ankle brace. No swelling observed by right medical malleous, limited ROM of right ankle. Compression stockings present.   Lymphadenopathy:     Cervical: No cervical adenopathy.  Skin:    General: Skin is warm and dry.     Capillary Refill: Capillary refill takes less than 2 seconds.  Neurological:     General: No focal deficit present.     Mental Status: She is alert and oriented to person, place, and time.     Motor: Weakness present.     Gait: Gait abnormal.     Comments: Wheelchair/walker  Psychiatric:        Mood and Affect: Mood normal.        Behavior: Behavior normal.     Labs reviewed: Recent Labs    04/22/20 0109 04/23/20 0652 04/24/20 0058 04/28/20 1641 04/29/20 0521 04/30/20 0340 05/03/20 0000 05/10/20 0000 05/14/20 0000  NA 126* 128* 127* 128* 127* 130* 131* 133* 132*  K 4.1 4.2 4.3 5.2* 4.5 4.6 4.7 4.4 4.6  CL 98 96* 97* 97* 98 100 100 100 99  CO2 22 25 25  20* 22 24 24* 23* 24*  GLUCOSE 111* 142* 109* 160* 119* 97  --   --   --   BUN 16 17 23  30* 33* 29* 23* 23* 33*  CREATININE 0.83 0.97 0.97 1.16* 1.37* 1.06* 0.9 0.8 1.0  CALCIUM 8.4* 8.6* 8.3* 9.0 8.9 8.7* 9.1 9.0 9.2  MG 1.6* 1.6* 1.6*  --   --   --   --   --   --   PHOS 2.8 2.6 3.1  --   --  2.8  --    --   --    Recent Labs    04/22/20 0109 04/23/20 0652 04/24/20 0058 04/30/20 0340  AST 22 33 23  --   ALT 16 25 20   --   ALKPHOS 75 103 77  --   BILITOT 1.0 0.9 0.7  --   PROT 6.5 6.9 5.8*  --   ALBUMIN 2.5* 2.5* 2.2* 2.4*   Recent Labs    04/22/20 0109 04/23/20 0652 04/24/20 0058 04/28/20 1641 04/29/20 0521  WBC 8.4 6.7 7.1 12.2* 10.2  NEUTROABS 5.6 4.4 4.2  --   --   HGB 10.8* 11.9* 10.3* 14.6 12.0  HCT 32.5* 34.9* 31.0* 43.5 34.9*  MCV 84.9 83.9 84.9 85.1 83.5  PLT 322 336 332 441* 348   Lab Results  Component Value Date   TSH 2.357 04/06/2020   No results found for: HGBA1C No results found for: CHOL, HDL, LDLCALC, LDLDIRECT, TRIG, CHOLHDL  Significant Diagnostic Results in last 30 days:  Cardiac event monitor  Result Date: 06/17/2020  4% A. fib/flutter burden, average rate 134 bpm  One 7 beat run of NSVT  Predominant rhythm is sinus rhythm. Range is 68-1 50 bpm with average of 92 bpm. No sustained ventricular tachycardia, significant pause, or high degree AV block.  4% A. fib/a flutter burden, average rate 134 bpm.  One 7 beat run of NSVT.  Total ventricular ectopy 3%.  1  patient triggered events, corresponding to sinus rhythm.    Assessment/Plan 1. Essential hypertension - followed by cardiology - norvasc, nadolol and lisinopril discontinued 04/11/2020 - average SBP 140-160 - hydralazine 10 mg prn for SBP>170 given 05/08 for SBP 180's  2. Gastroesophageal reflux disease, unspecified whether esophagitis present - stable with daily protonix - cont to avoid food triggers  3. Primary insomnia - sleeping well - ambient d/c 04/11/2020  4. Hyponatremia - Na+ 135- 05/17/2020, 135- 06/15/2020 - cont sodium tablets 1g TID  5. Syncope and collapse - continues to work with PT/OT - no recent incidents - cont falls safety plan  6. Slow transit constipation - last BM 05/11 - cont prn colace  7. Stage 3a chronic kidney disease (HCC) - BUN 21, creatinine  0.90, GFR 57- 06/15/2020 - continue to avoid nephrotoxic drugs like NSAIDS and dose adjust medications to be renally excreted  8. Chronic pain of right ankle - reports poor archs, pain near medial malleolus  - pain tolerable with scheduled tylenol - cont wearing right ankle brace - recommend finding orthotics    Family/ staff Communication: plan discussed with patient and nurse  Labs/tests ordered: none

## 2020-06-22 NOTE — Patient Instructions (Signed)
Medication Instructions:  Your physician recommends that you continue on your current medications as directed. Please refer to the Current Medication list given to you today.  *If you need a refill on your cardiac medications before your next appointment, please call your pharmacy*   Lab Work: BMET next week   If you have labs (blood work) drawn today and your tests are completely normal, you will receive your results only by: Marland Kitchen MyChart Message (if you have MyChart) OR . A paper copy in the mail If you have any lab test that is abnormal or we need to change your treatment, we will call you to review the results.   Follow-Up: At [facility], you and your health needs are our priority.  As part of our continuing mission to provide you with exceptional heart care, we have created designated Provider Care Teams.  These Care Teams include your primary Cardiologist (physician) and Advanced Practice Providers (APPs -  Physician Assistants and Nurse Practitioners) who all work together to provide you with the care you need, when you need it.  We recommend signing up for the patient portal called "MyChart".  Sign up information is provided on this After Visit Summary.  MyChart is used to connect with patients for Virtual Visits (Telemedicine).  Patients are able to view lab/test results, encounter notes, upcoming appointments, etc.  Non-urgent messages can be sent to your provider as well.   To learn more about what you can do with MyChart, go to [email or web address].    Your next appointment:   6 month(s)  The format for your next appointment:   In Person  Provider:   Oswaldo Milian, MD

## 2020-06-22 NOTE — Progress Notes (Signed)
Cardiology Office Note:    Date:  06/22/2020   ID:  [name], DOB May 08, 1929, MRN [id number]  PCP:  Virgie Dad, MD  Cardiologist:  Donato Heinz, MD  Electrophysiologist:  None   Referring MD: Virgie Dad, MD   Chief Complaint  Patient presents with  . Follow-up    6 weeks.  . Atrial Fibrillation    History of Present Illness:    [name] is a 87 y.o. female with a hx of atrial fibrillation, aortic stenosis, ovarian cancer, CKD, hyperlipidemia, hypertension, obesity who presents for follow-up.  She was referred by Dr. Rex Kras for evaluation of heart murmur, initially seen on 12/30/2018.  Echocardiogram on 01/04/2019 showed LVEF 60 to 95%, grade 1 diastolic dysfunction, normal RV function, mild aortic stenosis.    She was hospitalized in February 2022 with syncope thought to be due to orthostasis.  Ambien, Norvasc, nadolol were discontinued.  Echocardiogram showed no structural heart disease.  She was discharged with heart monitor.  30-day monitor on 06/04/2020 showed 4% A. fib/flutter burden with average rate 134 bpm and one 7 beat run of NSVT.  She was seen by Doreene Adas, PA on 05/07/2020 given new diagnosis atrial fibrillation/flutter.  She has been having issues with dizziness/orthostasis and frequent falls, and decision was made to hold off on anticoagulation.  Today, she is accompanied by her son, who also provides some history. Since last clinic visit, she seems to be doing well overall. She had a history of lightheadedness and near-syncope when she tries to stand up. This seems to have resolved at this time. Since her ED visit, she has not fallen and has not had any syncopal episodes. When she gets in bed, she is sometimes "rolled" by assisting caretakers at [facility]. She notes this rolling has caused her to become lightheaded as well. She states she has never had any chest pains or shortness of breath. For activity, she is capable of dressing  herself, she enjoys playing bingo, and she has physical therapists that work with her. Her husband died from bleeding due to Plavix, and thus she does not wish to take any blood thinners. Denies any lower extremity edema. Her at home blood pressure is stable and well-controlled. She does not drink any caffeinated drinks.    Past Medical History:  Diagnosis Date  . Anemia associated with acute blood loss 04/03/2011  . Arthritis    knees   . Ascites   . Blood transfusion    hx of 2011   . Complication of anesthesia    Sodium drops per pt   . Cystitis   . DIARRHEA, ANTIBIOTIC ASSOCIATED 05/31/2009   Qualifier: Diagnosis of  By: Tommy Medal MD, Roderic Scarce    . GERD (gastroesophageal reflux disease)   . H/O hiatal hernia   . Hyperlipidemia   . Hypertension   . Hyponatremia   . Neutropenia with fever ([place]) 05/18/2011  . OSTEOMYELITIS, CHRONIC, LOWER LEG 04/23/2009   Qualifier: Diagnosis of  By: Johnnye Sima MD, Dellis Filbert    . Osteopenia   . OSTEOPOROSIS 04/23/2009   Qualifier: Diagnosis of  By: Johnnye Sima MD, Dellis Filbert    . Ovarian cancer ([place]) 01/25/2011  . Pneumonia    hx of   . Postmenopausal atrophic vaginitis   . PROSTHETIC JOINT COMPLICATION 07/31/3084   Qualifier: Diagnosis of  By: Tommy Medal MD, Roderic Scarce    . Renal disorder    Decreased kidney function  . Staph infection 2010  after knee replacement  . Urinary frequency   . Vulvitis     Past Surgical History:  Procedure Laterality Date  . ABDOMINAL HYSTERECTOMY  04/01/2011   Procedure: HYSTERECTOMY ABDOMINAL;  Surgeon: Alvino Chapel, MD;  Location: [facility];  Service: Gynecology;  Laterality: N/A;  . APPENDECTOMY    . JOINT REPLACEMENT     R knee in 2008, 5 operations on L knee  . LAPAROTOMY  04/01/2011   Procedure: EXPLORATORY LAPAROTOMY;  Surgeon: Alvino Chapel, MD;  Location: [facility];  Service: Gynecology;  Laterality: N/A;  . OTHER SURGICAL HISTORY     hx of C section 1966  . SALPINGOOPHORECTOMY  04/01/2011    Procedure: SALPINGO OOPHERECTOMY;  Surgeon: Alvino Chapel, MD;  Location: [facility];  Service: Gynecology;  Laterality: Bilateral;  . TONSILLECTOMY      Current Medications: Current Meds  Medication Sig  . acetaminophen (TYLENOL) 500 MG tablet Take 2 tablets (1,000 mg total) by mouth 3 (three) times daily.  Marland Kitchen azelastine (ASTELIN) 0.1 % nasal spray Place 1 spray into both nostrils daily as needed for rhinitis or allergies.  . B Complex-C-Folic Acid (B-COMPLEX/FOLIC ACID/VITAMIN C PO) Take 1 tablet by mouth daily with breakfast.  . calcium carbonate (TUMS EX) 750 MG chewable tablet Chew 750 mg by mouth daily as needed for heartburn.  . Cholecalciferol (VITAMIN D3) 50 MCG (2000 UT) TABS Take 2,000 Units by mouth in the morning.  . cycloSPORINE (RESTASIS) 0.05 % ophthalmic emulsion Place 1 drop into both eyes 2 (two) times daily.  . diclofenac Sodium (VOLTAREN) 1 % GEL Apply 2 g topically 4 (four) times daily.  Marland Kitchen docusate sodium (COLACE) 100 MG capsule Take 100 mg by mouth daily as needed for mild constipation.  . Ensure (ENSURE) Take 237 mLs by mouth. 255mLS., oral, Twice A Day, Give Ensure 237MLs/Boost 237MLS Strawberry Liquid oral per family request W/Breakfast and 2PM to increase Nutritional adequacy. Doc.% consumed. Family will provide.  . hydrALAZINE (APRESOLINE) 10 MG tablet Take 10 mg by mouth daily as needed. PRN only if SBP more then 170 /90  . meclizine (ANTIVERT) 12.5 MG tablet Take 1 tablet (12.5 mg total) by mouth 2 (two) times daily as needed for dizziness.  . melatonin 3 MG TABS tablet Take 1 tablet (3 mg total) by mouth at bedtime.  . sodium chloride 1 g tablet Take 1 tablet (1 g total) by mouth 3 (three) times daily with meals.  . zinc oxide 20 % ointment Apply 1 application topically as needed (for redness- to be applied to buttocks/peri area and after every incontinent episode).     Allergies:   Morphine and related, Penicillins, Nsaids, and Shellfish allergy   Social  History   Socioeconomic History  . Marital status: Widowed    Spouse name: Not on file  . Number of children: Not on file  . Years of education: Not on file  . Highest education level: Not on file  Occupational History  . Not on file  Tobacco Use  . Smoking status: Former Smoker    Quit date: 02/11/1952    Years since quitting: 68.4  . Smokeless tobacco: Never Used  Vaping Use  . Vaping Use: Never used  Substance and Sexual Activity  . Alcohol use: Not Currently  . Drug use: Never  . Sexual activity: Never  Other Topics Concern  . Not on file  Social History Narrative  . Not on file   Social Determinants of Health  Financial Resource Strain: Not on file  Food Insecurity: Not on file  Transportation Needs: Not on file  Physical Activity: Not on file  Stress: Not on file  Social Connections: Not on file     Family History: The patient's family history includes Cancer in an other family member; Diabetes in her brother; Heart attack in her brother.  ROS:   Please see the history of present illness.    (+) Lightheadedness All other systems reviewed and are negative.  EKGs/Labs/Other Studies Reviewed:    The following studies were reviewed today:  Cardiac Telemetry Monitoring 06/04/2020:  4% A. fib/flutter burden, average rate 134 bpm  One 7 beat run of NSVT   Predominant rhythm is sinus rhythm. Range is 68-1 50 bpm with average of 92 bpm. No sustained ventricular tachycardia, significant pause, or high degree AV block.  4% A. fib/a flutter burden, average rate 134 bpm.  One 7 beat run of NSVT.  Total ventricular ectopy 3%.  1 patient triggered events, corresponding to sinus rhythm.   CT Angio Chest PE 04/16/2020: No evidence of pulmonary emboli.  Small right pleural effusion and bibasilar atelectatic changes.  Hiatal hernia with distal esophageal thickening likely related to reflux.  Aortic Atherosclerosis (ICD10-I70.0).  Echo 04/06/2020: 1. Left  ventricular ejection fraction, by estimation, is 70 to 75%. The  left ventricle has hyperdynamic function. The left ventricle has no  regional wall motion abnormalities. There is mild left ventricular  hypertrophy. Left ventricular diastolic  parameters are consistent with Grade I diastolic dysfunction (impaired  relaxation).  2. Right ventricular systolic function is normal. The right ventricular  size is normal.  3. The mitral valve is normal in structure. No evidence of mitral valve  regurgitation. No evidence of mitral stenosis.  4. The aortic valve is tricuspid. Aortic valve regurgitation is mild.  Mild aortic valve stenosis. Aortic valve area, by VTI measures 1.91 cm.  Aortic valve mean gradient measures 10.0 mmHg. Aortic valve Vmax measures  2.12 m/s.  5. The inferior vena cava is normal in size with greater than 50%  respiratory variability, suggesting right atrial pressure of 3 mmHg.   EKG:   06/22/2020: NSR, rate 94 bpm, poor R wave progression 01/17/2020: EKG was not ordered. 12/30/2018: normal sinus rhythm, rate 73, low voltage in precordial leads, no ST/T abnormalities  Recent Labs: 04/06/2020: TSH 2.357 04/24/2020: ALT 20; Magnesium 1.6 04/29/2020: Hemoglobin 12.0; Platelets 348 05/14/2020: BUN 33; Creatinine 1.0; Potassium 4.6; Sodium 132  Recent Lipid Panel No results found for: CHOL, TRIG, HDL, CHOLHDL, VLDL, LDLCALC, LDLDIRECT  Physical Exam:    VS:  BP 122/78 (BP Location: Right Arm, Patient Position: Sitting, Cuff Size: Normal)   Pulse 94   Ht 5\' 3"  (1.6 m)   BMI 29.30 kg/m     Wt Readings from Last 3 Encounters:  06/22/20 165 lb 6.4 oz (75 kg)  05/30/20 160 lb 6.4 oz (72.8 kg)  05/18/20 160 lb (72.6 kg)     GEN:  in no acute distress HEENT: Normal NECK: No JVD CARDIAC: RRR, 2/6 systolic murmur RESPIRATORY:  Clear to auscultation without rales, wheezing or rhonchi  ABDOMEN: Soft, non-tender, non-distended MUSCULOSKELETAL: No edema SKIN: Warm and  dry NEUROLOGIC:  Alert and oriented x 3 PSYCHIATRIC:  Normal affect   ASSESSMENT:    1. Paroxysmal atrial fibrillation (HCC)   2. Aortic valve stenosis, etiology of cardiac valve disease unspecified   3. Essential hypertension    PLAN:    Paroxysmal atrial fibrillation:  30-day monitor on 06/04/2020 showed 4% A. fib/flutter burden with average rate 134 bpm and one 7 beat run of NSVT.  CHA2DS2-VASc score 4 (hypertension, age x2, female).  Was not started on anticoagulation due to frequent falls. -She reports improvement in her lightheadedness and has had no recent falls.  Discussed risk/benefits of anticoagulation with her and her son.  She is adamant that she does not want to be on anticoagulation, as her husband died from an intracranial hemorrhage after a fall while on Plavix -Discussed starting diltiazem as needed for rate control should she have A. fib, but she wishes to hold off on any medications at this time  Aortic stenosis: Mild on echo 04/06/2020 (mean gradient 10 mmHg, AVA 1.9 cm).  Will monitor   Hypertension: Previously was on nadolol 20 mg daily, amlodipine 5 mg daily, lisinopril 20 mg daily.  All BP medicines have been held due to orthostatic hypotension and she was started on sodium tabs.  Symptoms have improved and she is normotensive today.  Recommend checking BMP  RTC in 6 months  Medication Adjustments/Labs and Tests Ordered: Current medicines are reviewed at length with the patient today.  Concerns regarding medicines are outlined above.  Orders Placed This Encounter  Procedures  . Basic metabolic panel  . EKG 12-Lead   No orders of the defined types were placed in this encounter.   Patient Instructions  Medication Instructions:  Your physician recommends that you continue on your current medications as directed. Please refer to the Current Medication list given to you today.  *If you need a refill on your cardiac medications before your next appointment, please  call your pharmacy*   Lab Work: BMET next week   If you have labs (blood work) drawn today and your tests are completely normal, you will receive your results only by: Marland Kitchen MyChart Message (if you have MyChart) OR . A paper copy in the mail If you have any lab test that is abnormal or we need to change your treatment, we will call you to review the results.   Follow-Up: At [facility], you and your health needs are our priority.  As part of our continuing mission to provide you with exceptional heart care, we have created designated Provider Care Teams.  These Care Teams include your primary Cardiologist (physician) and Advanced Practice Providers (APPs -  Physician Assistants and Nurse Practitioners) who all work together to provide you with the care you need, when you need it.  We recommend signing up for the patient portal called "MyChart".  Sign up information is provided on this After Visit Summary.  MyChart is used to connect with patients for Virtual Visits (Telemedicine).  Patients are able to view lab/test results, encounter notes, upcoming appointments, etc.  Non-urgent messages can be sent to your provider as well.   To learn more about what you can do with MyChart, go to [email or web address].    Your next appointment:   6 month(s)  The format for your next appointment:   In Person  Provider:   Oswaldo Milian, MD       [facility],acting as a scribe for Donato Heinz, MD.,have documented all relevant documentation on the behalf of Donato Heinz, MD,as directed by  Donato Heinz, MD while in the presence of Donato Heinz, MD.  I, Donato Heinz, MD, have reviewed all documentation for this visit. The documentation on 06/22/20 for the exam, diagnosis, procedures, and orders are all accurate and  complete.   Signed, Donato Heinz, MD  06/22/2020 5:37 PM    [facility]

## 2020-06-26 ENCOUNTER — Non-Acute Institutional Stay (SKILLED_NURSING_FACILITY): Payer: Medicare Other | Admitting: Nurse Practitioner

## 2020-06-26 DIAGNOSIS — R55 Syncope and collapse: Secondary | ICD-10-CM | POA: Diagnosis not present

## 2020-06-26 DIAGNOSIS — I10 Essential (primary) hypertension: Secondary | ICD-10-CM

## 2020-06-26 DIAGNOSIS — F5101 Primary insomnia: Secondary | ICD-10-CM

## 2020-06-26 DIAGNOSIS — U071 COVID-19: Secondary | ICD-10-CM

## 2020-06-26 DIAGNOSIS — E871 Hypo-osmolality and hyponatremia: Secondary | ICD-10-CM | POA: Diagnosis not present

## 2020-06-26 DIAGNOSIS — K219 Gastro-esophageal reflux disease without esophagitis: Secondary | ICD-10-CM | POA: Diagnosis not present

## 2020-06-26 DIAGNOSIS — K5901 Slow transit constipation: Secondary | ICD-10-CM | POA: Diagnosis not present

## 2020-06-26 DIAGNOSIS — M949 Disorder of cartilage, unspecified: Secondary | ICD-10-CM

## 2020-06-26 DIAGNOSIS — M899 Disorder of bone, unspecified: Secondary | ICD-10-CM | POA: Diagnosis not present

## 2020-06-26 DIAGNOSIS — M159 Polyosteoarthritis, unspecified: Secondary | ICD-10-CM | POA: Diagnosis not present

## 2020-06-26 DIAGNOSIS — C569 Malignant neoplasm of unspecified ovary: Secondary | ICD-10-CM | POA: Diagnosis not present

## 2020-06-27 DIAGNOSIS — U071 COVID-19: Secondary | ICD-10-CM | POA: Insufficient documentation

## 2020-06-27 NOTE — Assessment & Plan Note (Signed)
Off Ambien.

## 2020-06-27 NOTE — Progress Notes (Signed)
Location:   SNF [facility]   Place of Service:  SNF (31) Provider: [facility] Jennifer Sauceda NP  Virgie Dad, MD  Patient Care Team: Virgie Dad, MD as PCP - General (Internal Medicine) Donato Heinz, MD as PCP - Cardiology (Cardiology) Brien Few, MD as Consulting Physician (Obstetrics and Gynecology) Marti Sleigh, MD as Consulting Physician (Gynecology) Nancy Marus, MD as Consulting Physician (Gynecologic Oncology)  Extended Emergency Contact Information Primary Emergency Contact: [facility] Address: [address]          [place], [place] [address] [name] of [place] Phone: [phone] Mobile Phone: [phone] Relation: Daughter Secondary Emergency Contact: [place] Mobile Phone: [phone] Relation: Son  Code Status:  DNR Goals of care: Advanced Directive information Advanced Directives 05/30/2020  Does Patient Have a Medical Advance Directive? Yes  Type of Paramedic of [place];Living will  Does patient want to make changes to medical advance directive? No - Patient declined  Copy of [place] in Chart? Yes - validated most recent copy scanned in chart (See row information)  Would patient like information on creating a medical advance directive? -  Pre-existing out of facility DNR order (yellow form or pink MOST form) -     Chief Complaint  Patient presents with  . Acute Visit    COVID test positive     HPI:  Pt is a 87 y.o. female seen today for an acute visit for COVID test positive, the patient denied new fever, sore throat, cough, running nose/sinus congestion, headache, or loss of taste or smell.   Hyponatremia, stopped Furosemide-may be prn per nephrology, started NaCl, 1560ml fluid restriction. May consider SIADH workup, Nephrology consultation in hospital and may referral as outpatient. Na 135 05/17/20 Syncope: outpatient cardiac monitor. Orthostasis vs micturition  vs dehydration.  04/16/20-04/24/20 Syncope, noted orthostatic hypotension, treated with IVF, Cardiology arranged outpatient cardiac monitor. Suspected SIADH, placed on 1200 ml/day fluid restrictin.  04/06/20-04/11/20 Syncope with fall from commode with facial abrasion, suspected orthostatic syncope vs Ambien effect, LVEF 70-75%, mild AR and AS.  03/02/20 presyncope. Hx of ovarian CA s/p TAH/BSO 03/2011, in remission. Constipation takes prn Colace. HTN,takes Amlodipine, Nadolol, Lisinopril,  Hydralazine for Sbp >170, Dbp>90 OP, takes Ca, Vit D, Prolia GERD, takesPantoprazole. Insomnia,off Ambien.  R ankle pain, uses ankle brace, takes Tylenol.   Past Medical History:  Diagnosis Date  . Anemia associated with acute blood loss 04/03/2011  . Arthritis    knees   . Ascites   . Blood transfusion    hx of 2011   . Complication of anesthesia    Sodium drops per pt   . Cystitis   . DIARRHEA, ANTIBIOTIC ASSOCIATED 05/31/2009   Qualifier: Diagnosis of  By: Tommy Medal MD, Roderic Scarce    . GERD (gastroesophageal reflux disease)   . H/O hiatal hernia   . Hyperlipidemia   . Hypertension   . Hyponatremia   . Neutropenia with fever ([place]) 05/18/2011  . OSTEOMYELITIS, CHRONIC, LOWER LEG 04/23/2009   Qualifier: Diagnosis of  By: Johnnye Sima MD, Dellis Filbert    . Osteopenia   . OSTEOPOROSIS 04/23/2009   Qualifier: Diagnosis of  By: Johnnye Sima MD, Dellis Filbert    . Ovarian cancer ([place]) 01/25/2011  . Pneumonia    hx of   . Postmenopausal atrophic vaginitis   . PROSTHETIC JOINT COMPLICATION 8/52/7782   Qualifier: Diagnosis of  By: Tommy Medal MD, Roderic Scarce    . Renal disorder    Decreased kidney function  . Staph infection  2010   after knee replacement  . Urinary frequency   . Vulvitis    Past Surgical History:  Procedure Laterality Date  . ABDOMINAL HYSTERECTOMY  04/01/2011   Procedure: HYSTERECTOMY ABDOMINAL;   Surgeon: Jeannette Corpus, MD;  Location: [facility];  Service: Gynecology;  Laterality: N/A;  . APPENDECTOMY    . JOINT REPLACEMENT     R knee in 2008, 5 operations on L knee  . LAPAROTOMY  04/01/2011   Procedure: EXPLORATORY LAPAROTOMY;  Surgeon: Jeannette Corpus, MD;  Location: [facility];  Service: Gynecology;  Laterality: N/A;  . OTHER SURGICAL HISTORY     hx of C section 1966  . SALPINGOOPHORECTOMY  04/01/2011   Procedure: SALPINGO OOPHERECTOMY;  Surgeon: Jeannette Corpus, MD;  Location: [facility];  Service: Gynecology;  Laterality: Bilateral;  . TONSILLECTOMY      Allergies  Allergen Reactions  . Morphine And Related Anaphylaxis and Other (See Comments)    Given after knee replacement and code blue occurred  . Penicillins Hives, Itching and Other (See Comments)    Syncope, also  . Nsaids Other (See Comments)    Because of an abnormal kidney test result  . Shellfish Allergy Nausea And Vomiting and Other (See Comments)    Pt has shellfish allergy only.  Has had IV contrast x 2 and did fine.    Allergies as of 06/26/2020      Reactions   Morphine And Related Anaphylaxis, Other (See Comments)   Given after knee replacement and code blue occurred   Penicillins Hives, Itching, Other (See Comments)   Syncope, also   Nsaids Other (See Comments)   Because of an abnormal kidney test result   Shellfish Allergy Nausea And Vomiting, Other (See Comments)   Pt has shellfish allergy only.  Has had IV contrast x 2 and did fine.      Medication List       Accurate as of Jun 26, 2020 11:59 PM. If you have any questions, ask your nurse or doctor.        acetaminophen 500 MG tablet Commonly known as: TYLENOL Take 2 tablets (1,000 mg total) by mouth 3 (three) times daily.   azelastine 0.1 % nasal spray Commonly known as: ASTELIN Place 1 spray into both nostrils daily as needed for rhinitis or allergies.   B-COMPLEX/FOLIC ACID/VITAMIN C PO Take 1 tablet by mouth daily with  breakfast.   calcium carbonate 750 MG chewable tablet Commonly known as: TUMS EX Chew 750 mg by mouth daily as needed for heartburn.   cycloSPORINE 0.05 % ophthalmic emulsion Commonly known as: RESTASIS Place 1 drop into both eyes 2 (two) times daily.   diclofenac Sodium 1 % Gel Commonly known as: VOLTAREN Apply 2 g topically 4 (four) times daily.   docusate sodium 100 MG capsule Commonly known as: COLACE Take 100 mg by mouth daily as needed for mild constipation.   Ensure Take 237 mLs by mouth. ., oral, Twice A Day, Give Ensure 237MLs/Boost Strawberry Liquid oral per family request W/Breakfast and 2PM to increase Nutritional adequacy. Doc.% consumed. Family will provide.   hydrALAZINE 10 MG tablet Commonly known as: APRESOLINE Take 10 mg by mouth daily as needed. PRN only if SBP more then 170 /90   meclizine 12.5 MG tablet Commonly known as: ANTIVERT Take 1 tablet (12.5 mg total) by mouth 2 (two) times daily as needed for dizziness.   melatonin 3 MG Tabs tablet Take 1 tablet (3 mg total)  by mouth at bedtime.   sodium chloride 1 g tablet Take 1 tablet (1 g total) by mouth 3 (three) times daily with meals.   Vitamin D3 50 MCG (2000 UT) Tabs Take 2,000 Units by mouth in the morning.   zinc oxide 20 % ointment Apply 1 application topically as needed (for redness- to be applied to buttocks/peri area and after every incontinent episode).       Review of Systems  Constitutional: Negative for activity change, appetite change, fatigue and fever.  HENT: Positive for hearing loss. Negative for congestion, rhinorrhea, sinus pain, sore throat and trouble swallowing.   Eyes: Negative for visual disturbance.  Respiratory: Negative for cough and shortness of breath.   Cardiovascular: Negative for leg swelling.  Gastrointestinal: Negative for abdominal pain and constipation.  Genitourinary: Negative for dysuria, frequency and urgency.  Musculoskeletal: Positive for  arthralgias, back pain and gait problem.       R ankle pain.   Skin: Negative for color change.  Neurological: Negative for speech difficulty, weakness and light-headedness.  Hematological: Bruises/bleeds easily.       Right lower back  Psychiatric/Behavioral: Negative for confusion and sleep disturbance. The patient is not nervous/anxious.     Immunization History  Administered Date(s) Administered  . Influenza Split 11/13/2012, 12/13/2012, 11/30/2013, 10/09/2014, 11/24/2018  . Influenza, High Dose Seasonal PF 12/01/2013, 10/04/2014, 10/12/2015, 11/06/2016, 11/12/2017  . Influenza,inj,Quad PF,6+ Mos 10/24/2010  . Influenza-Unspecified 10/24/2010  . Moderna Sars-Covid-2 Vaccination 02/14/2019, 03/14/2019, 12/26/2019  . Pneumococcal Conjugate-13 09/28/2013  . Pneumococcal Polysaccharide-23 03/05/2012  . Td 08/27/2000  . Tdap 08/27/2000, 10/16/2010  . Zoster 01/11/2013, 03/31/2020   Pertinent  Health Maintenance Due  Topic Date Due  . INFLUENZA VACCINE  09/10/2020  . DEXA SCAN  Completed  . PNA vac Low Risk Adult  Completed   Fall Risk  03/02/2014  Falls in the past year? No   Functional Status Survey:    Vitals:   06/26/20 1427  BP: 137/81  Pulse: 72  Resp: 20  Temp: (!) 96.8 F (36 C)  SpO2: 94%   There is no height or weight on file to calculate BMI. Physical Exam Vitals and nursing note reviewed.  Constitutional:      Appearance: Normal appearance.  HENT:     Head: Normocephalic and atraumatic.     Nose: Nose normal. No congestion or rhinorrhea.     Mouth/Throat:     Mouth: Mucous membranes are moist.     Pharynx: No oropharyngeal exudate or posterior oropharyngeal erythema.  Eyes:     Extraocular Movements: Extraocular movements intact.     Conjunctiva/sclera: Conjunctivae normal.     Pupils: Pupils are equal, round, and reactive to light.  Cardiovascular:     Rate and Rhythm: Normal rate and regular rhythm.     Heart sounds: No murmur  heard.   Pulmonary:     Effort: Pulmonary effort is normal.     Breath sounds: No rales.  Abdominal:     General: Bowel sounds are normal.     Palpations: Abdomen is soft.     Hernia: A hernia is present.     Comments: Incision hernia  Musculoskeletal:        General: Tenderness present.     Cervical back: Normal range of motion and neck supple.     Right lower leg: No edema.     Left lower leg: No edema.     Comments: Right ankle brace, chronic pain.   Skin:  General: Skin is warm and dry.  Neurological:     General: No focal deficit present.     Mental Status: She is alert and oriented to person, place, and time. Mental status is at baseline.     Gait: Gait abnormal.  Psychiatric:        Mood and Affect: Mood normal.        Behavior: Behavior normal.        Thought Content: Thought content normal.        Judgment: Judgment normal.     Labs reviewed: Recent Labs    04/22/20 0109 04/23/20 0652 04/24/20 0058 04/28/20 1641 04/29/20 0521 04/30/20 0340 05/03/20 0000 05/10/20 0000 05/14/20 0000  NA 126* 128* 127* 128* 127* 130* 131* 133* 132*  K 4.1 4.2 4.3 5.2* 4.5 4.6 4.7 4.4 4.6  CL 98 96* 97* 97* 98 100 100 100 99  CO2 22 25 25  20* 22 24 24* 23* 24*  GLUCOSE 111* 142* 109* 160* 119* 97  --   --   --   BUN 16 17 23  30* 33* 29* 23* 23* 33*  CREATININE 0.83 0.97 0.97 1.16* 1.37* 1.06* 0.9 0.8 1.0  CALCIUM 8.4* 8.6* 8.3* 9.0 8.9 8.7* 9.1 9.0 9.2  MG 1.6* 1.6* 1.6*  --   --   --   --   --   --   PHOS 2.8 2.6 3.1  --   --  2.8  --   --   --    Recent Labs    04/22/20 0109 04/23/20 0652 04/24/20 0058 04/30/20 0340  AST 22 33 23  --   ALT 16 25 20   --   ALKPHOS 75 103 77  --   BILITOT 1.0 0.9 0.7  --   PROT 6.5 6.9 5.8*  --   ALBUMIN 2.5* 2.5* 2.2* 2.4*   Recent Labs    04/22/20 0109 04/23/20 0652 04/24/20 0058 04/28/20 1641 04/29/20 0521  WBC 8.4 6.7 7.1 12.2* 10.2  NEUTROABS 5.6 4.4 4.2  --   --   HGB 10.8* 11.9* 10.3* 14.6 12.0  HCT 32.5* 34.9*  31.0* 43.5 34.9*  MCV 84.9 83.9 84.9 85.1 83.5  PLT 322 336 332 441* 348   Lab Results  Component Value Date   TSH 2.357 04/06/2020   No results found for: HGBA1C No results found for: CHOL, HDL, LDLCALC, LDLDIRECT, TRIG, CHOLHDL  Significant Diagnostic Results in last 30 days:  Cardiac event monitor  Result Date: 06/17/2020  4% A. fib/flutter burden, average rate 134 bpm  One 7 beat run of NSVT  Predominant rhythm is sinus rhythm. Range is 68-1 50 bpm with average of 92 bpm. No sustained ventricular tachycardia, significant pause, or high degree AV block.  4% A. fib/a flutter burden, average rate 134 bpm.  One 7 beat run of NSVT.  Total ventricular ectopy 3%.  1 patient triggered events, corresponding to sinus rhythm.    Assessment/Plan COVID-19 virus RNA test result positive at limit of detection COVID test positive, the patient denied new fever, sore throat, cough, running nose/sinus congestion, headache, or loss of taste or smell. Place Vit C, Vit D, Zinc for 2 weeks. Observe.   Hyponatremia Hyponatremia, stopped Furosemide-may be prn per nephrology, started NaCl, 153ml fluid restriction. May consider SIADH workup, Nephrology consultation in hospital and may referral as outpatient. Na 135 05/17/20   Syncope and collapse Syncope: outpatient cardiac monitor. Orthostasis vs micturition vs dehydration.  04/16/20-04/24/20 Syncope, noted orthostatic hypotension, treated  with IVF, Cardiology arranged outpatient cardiac monitor. Suspected SIADH, placed on 1200 ml/day fluid restrictin.  04/06/20-04/11/20 Syncope with fall from commode with facial abrasion, suspected orthostatic syncope vs Ambien effect, LVEF 70-75%, mild AR and AS.  03/02/20 presyncope.   Ovarian cancer Hx of ovarian CA s/p TAH/BSO 03/2011, in remission.   Slow transit constipation  takes prn Colace.   Essential hypertension takes Amlodipine, Nadolol, Lisinopril,  Hydralazine for  Sbp >170, Dbp>90  Disorder of bone and cartilage  takes Ca, Vit D, Prolia   GERD  takesPantoprazole.   Insomnia Off Ambien.   Osteoarthritis uses ankle brace, takes Tylenol.      Family/ staff Communication: plan of care reviewed with the patient and charge nurse.   Labs/tests ordered: none  Time spend 35 minutes.

## 2020-06-27 NOTE — Assessment & Plan Note (Signed)
Hyponatremia, stopped Furosemide-may be prn per nephrology, started NaCl, 1500ml fluid restriction. May consider SIADH workup, Nephrology consultation in hospital and may referral as outpatient.Na 135 05/17/20  

## 2020-06-27 NOTE — Assessment & Plan Note (Signed)
takes Amlodipine, Nadolol, Lisinopril,  Hydralazine for Sbp >170, Dbp>90  

## 2020-06-27 NOTE — Assessment & Plan Note (Signed)
takes Pantoprazole 

## 2020-06-27 NOTE — Assessment & Plan Note (Signed)
Syncope: outpatient cardiac monitor. Orthostasis vs micturition vs dehydration.              04/16/20-04/24/20 Syncope, noted orthostatic hypotension, treated with IVF, Cardiology arranged outpatient cardiac monitor. Suspected SIADH, placed on 1200 ml/day fluid restrictin.              04/06/20-04/11/20 Syncope with fall from commode with facial abrasion, suspected orthostatic syncope vs Ambien effect, LVEF 70-75%, mild AR and AS.              03/02/20 presyncope.  

## 2020-06-27 NOTE — Assessment & Plan Note (Signed)
Hx of ovarian CA s/p TAH/BSO 03/2011, in remission. 

## 2020-06-27 NOTE — Assessment & Plan Note (Signed)
takes prn Colace.  °

## 2020-06-27 NOTE — Assessment & Plan Note (Signed)
uses ankle brace, takes Tylenol.

## 2020-06-27 NOTE — Assessment & Plan Note (Signed)
takes Ca, Vit D, Prolia

## 2020-06-27 NOTE — Assessment & Plan Note (Signed)
COVID test positive, the patient denied new fever, sore throat, cough, running nose/sinus congestion, headache, or loss of taste or smell. Place Vit C, Vit D, Zinc for 2 weeks. Observe.

## 2020-06-28 ENCOUNTER — Encounter: Payer: Self-pay | Admitting: Nurse Practitioner

## 2020-07-09 DIAGNOSIS — I951 Orthostatic hypotension: Secondary | ICD-10-CM | POA: Diagnosis not present

## 2020-07-09 DIAGNOSIS — R55 Syncope and collapse: Secondary | ICD-10-CM | POA: Diagnosis not present

## 2020-07-09 DIAGNOSIS — M6281 Muscle weakness (generalized): Secondary | ICD-10-CM | POA: Diagnosis not present

## 2020-07-09 DIAGNOSIS — R2681 Unsteadiness on feet: Secondary | ICD-10-CM | POA: Diagnosis not present

## 2020-07-09 DIAGNOSIS — I10 Essential (primary) hypertension: Secondary | ICD-10-CM | POA: Diagnosis not present

## 2020-07-09 DIAGNOSIS — R2689 Other abnormalities of gait and mobility: Secondary | ICD-10-CM | POA: Diagnosis not present

## 2020-07-10 DIAGNOSIS — R55 Syncope and collapse: Secondary | ICD-10-CM | POA: Diagnosis not present

## 2020-07-10 DIAGNOSIS — I10 Essential (primary) hypertension: Secondary | ICD-10-CM | POA: Diagnosis not present

## 2020-07-10 DIAGNOSIS — R2681 Unsteadiness on feet: Secondary | ICD-10-CM | POA: Diagnosis not present

## 2020-07-10 DIAGNOSIS — R2689 Other abnormalities of gait and mobility: Secondary | ICD-10-CM | POA: Diagnosis not present

## 2020-07-10 DIAGNOSIS — I951 Orthostatic hypotension: Secondary | ICD-10-CM | POA: Diagnosis not present

## 2020-07-10 DIAGNOSIS — M6281 Muscle weakness (generalized): Secondary | ICD-10-CM | POA: Diagnosis not present

## 2020-07-11 DIAGNOSIS — E871 Hypo-osmolality and hyponatremia: Secondary | ICD-10-CM | POA: Diagnosis not present

## 2020-07-11 DIAGNOSIS — C569 Malignant neoplasm of unspecified ovary: Secondary | ICD-10-CM | POA: Diagnosis not present

## 2020-07-11 DIAGNOSIS — I10 Essential (primary) hypertension: Secondary | ICD-10-CM | POA: Diagnosis not present

## 2020-07-11 DIAGNOSIS — N183 Chronic kidney disease, stage 3 unspecified: Secondary | ICD-10-CM | POA: Diagnosis not present

## 2020-07-11 DIAGNOSIS — I951 Orthostatic hypotension: Secondary | ICD-10-CM | POA: Diagnosis not present

## 2020-07-11 DIAGNOSIS — R55 Syncope and collapse: Secondary | ICD-10-CM | POA: Diagnosis not present

## 2020-07-11 DIAGNOSIS — D62 Acute posthemorrhagic anemia: Secondary | ICD-10-CM | POA: Diagnosis not present

## 2020-07-11 DIAGNOSIS — M25571 Pain in right ankle and joints of right foot: Secondary | ICD-10-CM | POA: Diagnosis not present

## 2020-07-12 DIAGNOSIS — E871 Hypo-osmolality and hyponatremia: Secondary | ICD-10-CM | POA: Diagnosis not present

## 2020-07-12 DIAGNOSIS — D62 Acute posthemorrhagic anemia: Secondary | ICD-10-CM | POA: Diagnosis not present

## 2020-07-12 DIAGNOSIS — N183 Chronic kidney disease, stage 3 unspecified: Secondary | ICD-10-CM | POA: Diagnosis not present

## 2020-07-12 DIAGNOSIS — R55 Syncope and collapse: Secondary | ICD-10-CM | POA: Diagnosis not present

## 2020-07-12 DIAGNOSIS — I10 Essential (primary) hypertension: Secondary | ICD-10-CM | POA: Diagnosis not present

## 2020-07-12 DIAGNOSIS — C569 Malignant neoplasm of unspecified ovary: Secondary | ICD-10-CM | POA: Diagnosis not present

## 2020-07-13 ENCOUNTER — Encounter: Payer: Self-pay | Admitting: Nurse Practitioner

## 2020-07-13 ENCOUNTER — Non-Acute Institutional Stay (SKILLED_NURSING_FACILITY): Payer: Medicare Other | Admitting: Nurse Practitioner

## 2020-07-13 DIAGNOSIS — E871 Hypo-osmolality and hyponatremia: Secondary | ICD-10-CM | POA: Diagnosis not present

## 2020-07-13 DIAGNOSIS — R55 Syncope and collapse: Secondary | ICD-10-CM

## 2020-07-13 DIAGNOSIS — M899 Disorder of bone, unspecified: Secondary | ICD-10-CM | POA: Diagnosis not present

## 2020-07-13 DIAGNOSIS — M949 Disorder of cartilage, unspecified: Secondary | ICD-10-CM | POA: Diagnosis not present

## 2020-07-13 DIAGNOSIS — K5901 Slow transit constipation: Secondary | ICD-10-CM

## 2020-07-13 DIAGNOSIS — K219 Gastro-esophageal reflux disease without esophagitis: Secondary | ICD-10-CM | POA: Diagnosis not present

## 2020-07-13 DIAGNOSIS — I10 Essential (primary) hypertension: Secondary | ICD-10-CM

## 2020-07-13 DIAGNOSIS — C569 Malignant neoplasm of unspecified ovary: Secondary | ICD-10-CM | POA: Diagnosis not present

## 2020-07-13 DIAGNOSIS — R350 Frequency of micturition: Secondary | ICD-10-CM | POA: Insufficient documentation

## 2020-07-13 DIAGNOSIS — F5101 Primary insomnia: Secondary | ICD-10-CM | POA: Diagnosis not present

## 2020-07-13 DIAGNOSIS — N183 Chronic kidney disease, stage 3 unspecified: Secondary | ICD-10-CM | POA: Diagnosis not present

## 2020-07-13 DIAGNOSIS — D62 Acute posthemorrhagic anemia: Secondary | ICD-10-CM | POA: Diagnosis not present

## 2020-07-13 NOTE — Progress Notes (Addendum)
Location:   [facility] Room Number: [place] of Service:  SNF (31) Provider:  Tiara Bartoli X Hermine Feria,NP    Patient Care Team: Virgie Dad, MD as PCP - General (Internal Medicine) Donato Heinz, MD as PCP - Cardiology (Cardiology) Brien Few, MD as Consulting Physician (Obstetrics and Gynecology) Marti Sleigh, MD as Consulting Physician (Gynecology) Nancy Marus, MD as Consulting Physician (Gynecologic Oncology)  Extended Emergency Contact Information Primary Emergency Contact: [facility] Address: [address]          [place], [place] [address] [name] of [place] Phone: [phone] Mobile Phone: [phone] Relation: Daughter Secondary Emergency Contact: [place] Mobile Phone: [phone] Relation: Son  Code Status:  DNR Goals of care: Advanced Directive information Advanced Directives 07/13/2020  Does Patient Have a Medical Advance Directive? Yes  Type of Paramedic of [place];Living will  Does patient want to make changes to medical advance directive? No - Patient declined  Copy of [place] in Chart? Yes - validated most recent copy scanned in chart (See row information)  Would patient like information on creating a medical advance directive? -  Pre-existing out of facility DNR order (yellow form or pink MOST form) -     Chief Complaint  Patient presents with  . Medical Management of Chronic Issues    Routine follow up.   Marland Kitchen Health Maintenance    Discuss need for PNA vaccine and shingles vaccine    HPI:  Pt is a 87 y.o. female seen today for medical management of chronic diseases.      C/o urinary frequency, incontinent of urine last night, denied dysuria, lower abd/back pain, denied nausea, vomiting, constipation, or general malaise. She is afebrile, no change of her appetite.   Hyponatremia, stopped Furosemide-may be prn per nephrology, started NaCl, 1567ml  fluid restriction. May consider SIADH workup, Nephrology consultation in hospital and may referral as outpatient.Na 135 05/17/20 Syncope: outpatient cardiac monitor. Orthostasis vs micturition vs dehydration.  04/16/20-04/24/20 Syncope, noted orthostatic hypotension, treated with IVF, Cardiology arranged outpatient cardiac monitor. Suspected SIADH 04/06/20-04/11/20 Syncope with fall from commode, suspected orthostatic syncope vs Ambien effect, LVEF 70-75%, mild AR and AS.  03/02/20 presyncope. Hx of ovarian CA s/p TAH/BSO 03/2011, in remission. Constipation takes prn Colace. HTN,takes Amlodipine, Nadolol, Lisinopril,  Hydralazine for Sbp >170, Dbp>90 OP, takes Ca, Vit D, Prolia GERD, takesPantoprazole. Insomnia,off Ambien.             R ankle pain, uses ankle brace, takes Tylenol.     Past Medical History:  Diagnosis Date  . Anemia associated with acute blood loss 04/03/2011  . Arthritis    knees   . Ascites   . Blood transfusion    hx of 2011   . Complication of anesthesia    Sodium drops per pt   . Cystitis   . DIARRHEA, ANTIBIOTIC ASSOCIATED 05/31/2009   Qualifier: Diagnosis of  By: Tommy Medal MD, Roderic Scarce    . GERD (gastroesophageal reflux disease)   . H/O hiatal hernia   . Hyperlipidemia   . Hypertension   . Hyponatremia   . Neutropenia with fever ([place]) 05/18/2011  . OSTEOMYELITIS, CHRONIC, LOWER LEG 04/23/2009   Qualifier: Diagnosis of  By: Johnnye Sima MD, Dellis Filbert    . Osteopenia   . OSTEOPOROSIS 04/23/2009   Qualifier: Diagnosis of  By: Johnnye Sima MD, Dellis Filbert    . Ovarian cancer ([place]) 01/25/2011  . Pneumonia    hx of   . Postmenopausal atrophic  vaginitis   . PROSTHETIC JOINT COMPLICATION 3/47/4259   Qualifier: Diagnosis of  By: Tommy Medal MD, Roderic Scarce    . Renal disorder    Decreased kidney function  . Staph infection 2010   after knee replacement  . Urinary  frequency   . Vulvitis    Past Surgical History:  Procedure Laterality Date  . ABDOMINAL HYSTERECTOMY  04/01/2011   Procedure: HYSTERECTOMY ABDOMINAL;  Surgeon: Alvino Chapel, MD;  Location: [facility];  Service: Gynecology;  Laterality: N/A;  . APPENDECTOMY    . JOINT REPLACEMENT     R knee in 2008, 5 operations on L knee  . LAPAROTOMY  04/01/2011   Procedure: EXPLORATORY LAPAROTOMY;  Surgeon: Alvino Chapel, MD;  Location: [facility];  Service: Gynecology;  Laterality: N/A;  . OTHER SURGICAL HISTORY     hx of C section 1966  . SALPINGOOPHORECTOMY  04/01/2011   Procedure: SALPINGO OOPHERECTOMY;  Surgeon: Alvino Chapel, MD;  Location: [facility];  Service: Gynecology;  Laterality: Bilateral;  . TONSILLECTOMY      Allergies  Allergen Reactions  . Morphine And Related Anaphylaxis and Other (See Comments)    Given after knee replacement and code blue occurred  . Penicillins Hives, Itching and Other (See Comments)    Syncope, also  . Nsaids Other (See Comments)    Because of an abnormal kidney test result  . Shellfish Allergy Nausea And Vomiting and Other (See Comments)    Pt has shellfish allergy only.  Has had IV contrast x 2 and did fine.    Allergies as of 07/13/2020      Reactions   Morphine And Related Anaphylaxis, Other (See Comments)   Given after knee replacement and code blue occurred   Penicillins Hives, Itching, Other (See Comments)   Syncope, also   Nsaids Other (See Comments)   Because of an abnormal kidney test result   Shellfish Allergy Nausea And Vomiting, Other (See Comments)   Pt has shellfish allergy only.  Has had IV contrast x 2 and did fine.      Medication List       Accurate as of July 13, 2020 11:59 PM. If you have any questions, ask your nurse or doctor.        acetaminophen 500 MG tablet Commonly known as: TYLENOL Take 2 tablets (1,000 mg total) by mouth 3 (three) times daily.   azelastine 0.1 % nasal spray Commonly known as:  ASTELIN Place 1 spray into both nostrils daily as needed for rhinitis or allergies.   B-COMPLEX/FOLIC ACID/VITAMIN C PO Take 1 tablet by mouth daily with breakfast.   calcium carbonate 750 MG chewable tablet Commonly known as: TUMS EX Chew 750 mg by mouth daily as needed for heartburn.   cycloSPORINE 0.05 % ophthalmic emulsion Commonly known as: RESTASIS Place 1 drop into both eyes 2 (two) times daily.   diclofenac Sodium 1 % Gel Commonly known as: VOLTAREN Apply 2 g topically 4 (four) times daily.   docusate sodium 100 MG capsule Commonly known as: COLACE Take 100 mg by mouth daily as needed for mild constipation.   Ensure Take 237 mLs by mouth. 271mLS., oral, Twice A Day, Give Ensure 237MLs/Boost 237MLS Strawberry Liquid oral per family request W/Breakfast and 2PM to increase Nutritional adequacy. Doc.% consumed. Family will provide.   hydrALAZINE 10 MG tablet Commonly known as: APRESOLINE Take 10 mg by mouth daily as needed. PRN only if SBP more then 170 /90   meclizine 12.5  MG tablet Commonly known as: ANTIVERT Take 1 tablet (12.5 mg total) by mouth 2 (two) times daily as needed for dizziness.   melatonin 3 MG Tabs tablet Take 1 tablet (3 mg total) by mouth at bedtime.   pantoprazole 40 MG tablet Commonly known as: PROTONIX Take 1 tablet (40 mg total) by mouth 2 (two) times daily.   sodium chloride 1 g tablet Take 1 tablet (1 g total) by mouth 3 (three) times daily with meals.   Vitamin D3 50 MCG (2000 UT) Tabs Take 2,000 Units by mouth in the morning.   zinc oxide 20 % ointment Apply 1 application topically as needed (for redness- to be applied to buttocks/peri area and after every incontinent episode).       Review of Systems  Constitutional: Negative for activity change, appetite change, fatigue and fever.  HENT: Positive for hearing loss. Negative for congestion and trouble swallowing.   Eyes: Negative for visual disturbance.  Respiratory: Negative for  cough and shortness of breath.   Cardiovascular: Negative for leg swelling.  Gastrointestinal: Negative for abdominal pain and constipation.  Genitourinary: Positive for frequency. Negative for dysuria and urgency.       Increased urinary frequency and incontinence last night.   Musculoskeletal: Positive for arthralgias, back pain and gait problem.       R ankle pain.   Skin: Negative for color change.  Neurological: Negative for speech difficulty, weakness and light-headedness.  Psychiatric/Behavioral: Negative for confusion and sleep disturbance. The patient is not nervous/anxious.     Immunization History  Administered Date(s) Administered  . Influenza Split 11/13/2012, 12/13/2012, 11/30/2013, 10/09/2014, 11/24/2018  . Influenza, High Dose Seasonal PF 12/01/2013, 10/04/2014, 10/12/2015, 11/06/2016, 11/12/2017  . Influenza,inj,Quad PF,6+ Mos 10/24/2010  . Influenza-Unspecified 10/24/2010  . Moderna Sars-Covid-2 Vaccination 02/14/2019, 03/14/2019, 12/26/2019  . Pneumococcal Conjugate-13 09/28/2013  . Pneumococcal Polysaccharide-23 03/05/2012  . Td 08/27/2000  . Tdap 08/27/2000, 10/16/2010  . Zoster, Live 01/11/2013, 03/31/2020   Pertinent  Health Maintenance Due  Topic Date Due  . INFLUENZA VACCINE  09/10/2020  . DEXA SCAN  Completed  . PNA vac Low Risk Adult  Completed   Fall Risk  03/02/2014  Falls in the past year? No   Functional Status Survey:    Vitals:   07/13/20 0841  BP: 116/66  Pulse: 86  Resp: 20  Temp: (!) 97.5 F (36.4 C)  SpO2: 93%  Weight: 165 lb (74.8 kg)  Height: 5\' 3"  (1.6 m)   Body mass index is 29.23 kg/m. Physical Exam Vitals and nursing note reviewed.  Constitutional:      Appearance: Normal appearance.  HENT:     Head: Normocephalic and atraumatic.     Mouth/Throat:     Mouth: Mucous membranes are moist.  Eyes:     Extraocular Movements: Extraocular movements intact.     Conjunctiva/sclera: Conjunctivae normal.     Pupils: Pupils are  equal, round, and reactive to light.  Cardiovascular:     Rate and Rhythm: Normal rate and regular rhythm.     Heart sounds: No murmur heard.   Pulmonary:     Effort: Pulmonary effort is normal.     Breath sounds: No rales.  Abdominal:     General: Bowel sounds are normal. There is no distension.     Palpations: Abdomen is soft.     Tenderness: There is no abdominal tenderness. There is no right CVA tenderness, left CVA tenderness, guarding or rebound.     Hernia: A hernia  is present.     Comments: Incision hernia  Musculoskeletal:        General: Tenderness present.     Cervical back: Normal range of motion and neck supple.     Right lower leg: No edema.     Left lower leg: No edema.     Comments: Right ankle brace, chronic pain.  Skin:    General: Skin is warm and dry.  Neurological:     General: No focal deficit present.     Mental Status: She is alert and oriented to person, place, and time. Mental status is at baseline.     Gait: Gait abnormal.  Psychiatric:        Mood and Affect: Mood normal.        Behavior: Behavior normal.        Thought Content: Thought content normal.        Judgment: Judgment normal.     Labs reviewed: Recent Labs    04/22/20 0109 04/23/20 0652 04/24/20 0058 04/28/20 1641 04/29/20 0521 04/30/20 0340 05/03/20 0000 05/10/20 0000 05/14/20 0000  NA 126* 128* 127* 128* 127* 130* 131* 133* 132*  K 4.1 4.2 4.3 5.2* 4.5 4.6 4.7 4.4 4.6  CL 98 96* 97* 97* 98 100 100 100 99  CO2 22 25 25  20* 22 24 24* 23* 24*  GLUCOSE 111* 142* 109* 160* 119* 97  --   --   --   BUN 16 17 23  30* 33* 29* 23* 23* 33*  CREATININE 0.83 0.97 0.97 1.16* 1.37* 1.06* 0.9 0.8 1.0  CALCIUM 8.4* 8.6* 8.3* 9.0 8.9 8.7* 9.1 9.0 9.2  MG 1.6* 1.6* 1.6*  --   --   --   --   --   --   PHOS 2.8 2.6 3.1  --   --  2.8  --   --   --    Recent Labs    04/22/20 0109 04/23/20 0652 04/24/20 0058 04/30/20 0340  AST 22 33 23  --   ALT 16 25 20   --   ALKPHOS 75 103 77  --    BILITOT 1.0 0.9 0.7  --   PROT 6.5 6.9 5.8*  --   ALBUMIN 2.5* 2.5* 2.2* 2.4*   Recent Labs    04/22/20 0109 04/23/20 0652 04/24/20 0058 04/28/20 1641 04/29/20 0521  WBC 8.4 6.7 7.1 12.2* 10.2  NEUTROABS 5.6 4.4 4.2  --   --   HGB 10.8* 11.9* 10.3* 14.6 12.0  HCT 32.5* 34.9* 31.0* 43.5 34.9*  MCV 84.9 83.9 84.9 85.1 83.5  PLT 322 336 332 441* 348   Lab Results  Component Value Date   TSH 2.357 04/06/2020   No results found for: HGBA1C No results found for: CHOL, HDL, LDLCALC, LDLDIRECT, TRIG, CHOLHDL  Significant Diagnostic Results in last 30 days:  No results found.  Assessment/Plan Increased urinary frequency C/o urinary frequency, incontinent of urine last night, denied dysuria, lower abd/back pain, denied nausea, vomiting, constipation, or general malaise. She is afebrile, no change of her appetite.  UA C/S to r/o UTI 07/13/20 urine culture: E Coli 50,000-100,000c/ml. At baseline  Hyponatremia Hyponatremia, stopped Furosemide-may be prn per nephrology, started NaCl, 1536ml fluid restriction. May consider SIADH workup, Nephrology consultation in hospital and may referral as outpatient.Na 135 05/17/20   Syncope and collapse Syncope: outpatient cardiac monitor. Orthostasis vs micturition vs dehydration.  04/16/20-04/24/20 Syncope, noted orthostatic hypotension, treated with IVF, Cardiology arranged outpatient cardiac monitor. Suspected SIADH 04/06/20-04/11/20 Syncope  with fall from commode, suspected orthostatic syncope vs Ambien effect, LVEF 70-75%, mild AR and AS.  03/02/20 presyncope.   Ovarian cancer Hx of ovarian CA s/p TAH/BSO 03/2011, in remission.   Slow transit constipation Stable, continue Colace.   Essential hypertension takes Amlodipine, Nadolol, Lisinopril,  Hydralazine for Sbp >170, Dbp>90   Disorder of bone and cartilage  takes Ca, Vit D, Prolia   GERD Stable, continue Pantoprazole.   Insomnia Stable, off  Ambien.      Family/ staff Communication: plan of care reviewed with the patient and charge nurse.   Labs/tests ordered:  UA C/S  Time spend 35 minutes.

## 2020-07-13 NOTE — Assessment & Plan Note (Signed)
Hx of ovarian CA s/p TAH/BSO 03/2011, in remission. 

## 2020-07-13 NOTE — Assessment & Plan Note (Signed)
Stable, off Ambien.

## 2020-07-13 NOTE — Assessment & Plan Note (Addendum)
C/o urinary frequency, incontinent of urine last night, denied dysuria, lower abd/back pain, denied nausea, vomiting, constipation, or general malaise. She is afebrile, no change of her appetite.  UA C/S to r/o UTI 07/13/20 urine culture: E Coli 50,000-100,000c/ml. At baseline

## 2020-07-13 NOTE — Assessment & Plan Note (Signed)
Hyponatremia, stopped Furosemide-may be prn per nephrology, started NaCl, 1513ml fluid restriction. May consider SIADH workup, Nephrology consultation in hospital and may referral as outpatient.Na 135 05/17/20

## 2020-07-13 NOTE — Assessment & Plan Note (Signed)
takes Amlodipine, Nadolol, Lisinopril,  Hydralazine for Sbp >170, Dbp>90

## 2020-07-13 NOTE — Assessment & Plan Note (Signed)
Syncope: outpatient cardiac monitor. Orthostasis vs micturition vs dehydration.  04/16/20-04/24/20 Syncope, noted orthostatic hypotension, treated with IVF, Cardiology arranged outpatient cardiac monitor. Suspected SIADH 04/06/20-04/11/20 Syncope with fall from commode, suspected orthostatic syncope vs Ambien effect, LVEF 70-75%, mild AR and AS.  03/02/20 presyncope.

## 2020-07-13 NOTE — Assessment & Plan Note (Signed)
takes Ca, Vit D, Prolia

## 2020-07-13 NOTE — Assessment & Plan Note (Addendum)
Stable, continue Pantoprazole.  

## 2020-07-13 NOTE — Assessment & Plan Note (Signed)
Stable, continue Colace.  

## 2020-07-16 DIAGNOSIS — C569 Malignant neoplasm of unspecified ovary: Secondary | ICD-10-CM | POA: Diagnosis not present

## 2020-07-16 DIAGNOSIS — N183 Chronic kidney disease, stage 3 unspecified: Secondary | ICD-10-CM | POA: Diagnosis not present

## 2020-07-16 DIAGNOSIS — I10 Essential (primary) hypertension: Secondary | ICD-10-CM | POA: Diagnosis not present

## 2020-07-16 DIAGNOSIS — D62 Acute posthemorrhagic anemia: Secondary | ICD-10-CM | POA: Diagnosis not present

## 2020-07-16 DIAGNOSIS — E871 Hypo-osmolality and hyponatremia: Secondary | ICD-10-CM | POA: Diagnosis not present

## 2020-07-16 DIAGNOSIS — R55 Syncope and collapse: Secondary | ICD-10-CM | POA: Diagnosis not present

## 2020-07-17 DIAGNOSIS — D62 Acute posthemorrhagic anemia: Secondary | ICD-10-CM | POA: Diagnosis not present

## 2020-07-17 DIAGNOSIS — C569 Malignant neoplasm of unspecified ovary: Secondary | ICD-10-CM | POA: Diagnosis not present

## 2020-07-17 DIAGNOSIS — E871 Hypo-osmolality and hyponatremia: Secondary | ICD-10-CM | POA: Diagnosis not present

## 2020-07-17 DIAGNOSIS — N183 Chronic kidney disease, stage 3 unspecified: Secondary | ICD-10-CM | POA: Diagnosis not present

## 2020-07-17 DIAGNOSIS — I10 Essential (primary) hypertension: Secondary | ICD-10-CM | POA: Diagnosis not present

## 2020-07-17 DIAGNOSIS — R55 Syncope and collapse: Secondary | ICD-10-CM | POA: Diagnosis not present

## 2020-07-18 DIAGNOSIS — R55 Syncope and collapse: Secondary | ICD-10-CM | POA: Diagnosis not present

## 2020-07-18 DIAGNOSIS — I10 Essential (primary) hypertension: Secondary | ICD-10-CM | POA: Diagnosis not present

## 2020-07-18 DIAGNOSIS — Z23 Encounter for immunization: Secondary | ICD-10-CM | POA: Diagnosis not present

## 2020-07-18 DIAGNOSIS — D62 Acute posthemorrhagic anemia: Secondary | ICD-10-CM | POA: Diagnosis not present

## 2020-07-18 DIAGNOSIS — E871 Hypo-osmolality and hyponatremia: Secondary | ICD-10-CM | POA: Diagnosis not present

## 2020-07-18 DIAGNOSIS — N183 Chronic kidney disease, stage 3 unspecified: Secondary | ICD-10-CM | POA: Diagnosis not present

## 2020-07-18 DIAGNOSIS — C569 Malignant neoplasm of unspecified ovary: Secondary | ICD-10-CM | POA: Diagnosis not present

## 2020-07-19 DIAGNOSIS — D62 Acute posthemorrhagic anemia: Secondary | ICD-10-CM | POA: Diagnosis not present

## 2020-07-19 DIAGNOSIS — N183 Chronic kidney disease, stage 3 unspecified: Secondary | ICD-10-CM | POA: Diagnosis not present

## 2020-07-19 DIAGNOSIS — I10 Essential (primary) hypertension: Secondary | ICD-10-CM | POA: Diagnosis not present

## 2020-07-19 DIAGNOSIS — R55 Syncope and collapse: Secondary | ICD-10-CM | POA: Diagnosis not present

## 2020-07-19 DIAGNOSIS — C569 Malignant neoplasm of unspecified ovary: Secondary | ICD-10-CM | POA: Diagnosis not present

## 2020-07-19 DIAGNOSIS — E871 Hypo-osmolality and hyponatremia: Secondary | ICD-10-CM | POA: Diagnosis not present

## 2020-07-20 DIAGNOSIS — N183 Chronic kidney disease, stage 3 unspecified: Secondary | ICD-10-CM | POA: Diagnosis not present

## 2020-07-20 DIAGNOSIS — D62 Acute posthemorrhagic anemia: Secondary | ICD-10-CM | POA: Diagnosis not present

## 2020-07-20 DIAGNOSIS — E871 Hypo-osmolality and hyponatremia: Secondary | ICD-10-CM | POA: Diagnosis not present

## 2020-07-20 DIAGNOSIS — R55 Syncope and collapse: Secondary | ICD-10-CM | POA: Diagnosis not present

## 2020-07-20 DIAGNOSIS — I10 Essential (primary) hypertension: Secondary | ICD-10-CM | POA: Diagnosis not present

## 2020-07-20 DIAGNOSIS — C569 Malignant neoplasm of unspecified ovary: Secondary | ICD-10-CM | POA: Diagnosis not present

## 2020-07-23 DIAGNOSIS — R55 Syncope and collapse: Secondary | ICD-10-CM | POA: Diagnosis not present

## 2020-07-23 DIAGNOSIS — I10 Essential (primary) hypertension: Secondary | ICD-10-CM | POA: Diagnosis not present

## 2020-07-23 DIAGNOSIS — N183 Chronic kidney disease, stage 3 unspecified: Secondary | ICD-10-CM | POA: Diagnosis not present

## 2020-07-23 DIAGNOSIS — C569 Malignant neoplasm of unspecified ovary: Secondary | ICD-10-CM | POA: Diagnosis not present

## 2020-07-23 DIAGNOSIS — E871 Hypo-osmolality and hyponatremia: Secondary | ICD-10-CM | POA: Diagnosis not present

## 2020-07-23 DIAGNOSIS — D62 Acute posthemorrhagic anemia: Secondary | ICD-10-CM | POA: Diagnosis not present

## 2020-07-24 DIAGNOSIS — I10 Essential (primary) hypertension: Secondary | ICD-10-CM | POA: Diagnosis not present

## 2020-07-24 DIAGNOSIS — C569 Malignant neoplasm of unspecified ovary: Secondary | ICD-10-CM | POA: Diagnosis not present

## 2020-07-24 DIAGNOSIS — E871 Hypo-osmolality and hyponatremia: Secondary | ICD-10-CM | POA: Diagnosis not present

## 2020-07-24 DIAGNOSIS — D62 Acute posthemorrhagic anemia: Secondary | ICD-10-CM | POA: Diagnosis not present

## 2020-07-24 DIAGNOSIS — N183 Chronic kidney disease, stage 3 unspecified: Secondary | ICD-10-CM | POA: Diagnosis not present

## 2020-07-24 DIAGNOSIS — R55 Syncope and collapse: Secondary | ICD-10-CM | POA: Diagnosis not present

## 2020-07-26 DIAGNOSIS — C569 Malignant neoplasm of unspecified ovary: Secondary | ICD-10-CM | POA: Diagnosis not present

## 2020-07-26 DIAGNOSIS — D62 Acute posthemorrhagic anemia: Secondary | ICD-10-CM | POA: Diagnosis not present

## 2020-07-26 DIAGNOSIS — I10 Essential (primary) hypertension: Secondary | ICD-10-CM | POA: Diagnosis not present

## 2020-07-26 DIAGNOSIS — R55 Syncope and collapse: Secondary | ICD-10-CM | POA: Diagnosis not present

## 2020-07-26 DIAGNOSIS — N183 Chronic kidney disease, stage 3 unspecified: Secondary | ICD-10-CM | POA: Diagnosis not present

## 2020-07-26 DIAGNOSIS — E871 Hypo-osmolality and hyponatremia: Secondary | ICD-10-CM | POA: Diagnosis not present

## 2020-07-27 DIAGNOSIS — N183 Chronic kidney disease, stage 3 unspecified: Secondary | ICD-10-CM | POA: Diagnosis not present

## 2020-07-27 DIAGNOSIS — E871 Hypo-osmolality and hyponatremia: Secondary | ICD-10-CM | POA: Diagnosis not present

## 2020-07-27 DIAGNOSIS — D62 Acute posthemorrhagic anemia: Secondary | ICD-10-CM | POA: Diagnosis not present

## 2020-07-27 DIAGNOSIS — R55 Syncope and collapse: Secondary | ICD-10-CM | POA: Diagnosis not present

## 2020-07-27 DIAGNOSIS — C569 Malignant neoplasm of unspecified ovary: Secondary | ICD-10-CM | POA: Diagnosis not present

## 2020-07-27 DIAGNOSIS — I10 Essential (primary) hypertension: Secondary | ICD-10-CM | POA: Diagnosis not present

## 2020-07-28 DIAGNOSIS — E871 Hypo-osmolality and hyponatremia: Secondary | ICD-10-CM | POA: Diagnosis not present

## 2020-07-28 DIAGNOSIS — R55 Syncope and collapse: Secondary | ICD-10-CM | POA: Diagnosis not present

## 2020-07-28 DIAGNOSIS — C569 Malignant neoplasm of unspecified ovary: Secondary | ICD-10-CM | POA: Diagnosis not present

## 2020-07-28 DIAGNOSIS — I10 Essential (primary) hypertension: Secondary | ICD-10-CM | POA: Diagnosis not present

## 2020-07-28 DIAGNOSIS — N183 Chronic kidney disease, stage 3 unspecified: Secondary | ICD-10-CM | POA: Diagnosis not present

## 2020-07-28 DIAGNOSIS — D62 Acute posthemorrhagic anemia: Secondary | ICD-10-CM | POA: Diagnosis not present

## 2020-07-30 ENCOUNTER — Non-Acute Institutional Stay (SKILLED_NURSING_FACILITY): Payer: Medicare Other | Admitting: Orthopedic Surgery

## 2020-07-30 ENCOUNTER — Encounter: Payer: Self-pay | Admitting: Orthopedic Surgery

## 2020-07-30 DIAGNOSIS — R55 Syncope and collapse: Secondary | ICD-10-CM | POA: Diagnosis not present

## 2020-07-30 DIAGNOSIS — I10 Essential (primary) hypertension: Secondary | ICD-10-CM | POA: Diagnosis not present

## 2020-07-30 DIAGNOSIS — H1032 Unspecified acute conjunctivitis, left eye: Secondary | ICD-10-CM

## 2020-07-30 DIAGNOSIS — N183 Chronic kidney disease, stage 3 unspecified: Secondary | ICD-10-CM | POA: Diagnosis not present

## 2020-07-30 DIAGNOSIS — E871 Hypo-osmolality and hyponatremia: Secondary | ICD-10-CM | POA: Diagnosis not present

## 2020-07-30 DIAGNOSIS — C569 Malignant neoplasm of unspecified ovary: Secondary | ICD-10-CM | POA: Diagnosis not present

## 2020-07-30 DIAGNOSIS — D62 Acute posthemorrhagic anemia: Secondary | ICD-10-CM | POA: Diagnosis not present

## 2020-07-30 MED ORDER — OFLOXACIN 0.3 % OP SOLN
1.0000 [drp] | Freq: Four times a day (QID) | OPHTHALMIC | 0 refills | Status: AC
Start: 2020-07-30 — End: 2020-08-06

## 2020-07-30 NOTE — Progress Notes (Signed)
Location:  [facility] Room Number: 4 Place of Service:  SNF [phone]) Provider:  Windell Moulding, AGNP-C  Virgie Dad, MD  Patient Care Team: Virgie Dad, MD as PCP - General (Internal Medicine) Donato Heinz, MD as PCP - Cardiology (Cardiology) Brien Few, MD as Consulting Physician (Obstetrics and Gynecology) Marti Sleigh, MD as Consulting Physician (Gynecology) Nancy Marus, MD as Consulting Physician (Gynecologic Oncology)  Extended Emergency Contact Information Primary Emergency Contact: [facility] Address: [address]          [place], [place] [address] [name] of [place] Phone: [phone] Mobile Phone: [phone] Relation: Daughter Secondary Emergency Contact: [place] Mobile Phone: [phone] Relation: Son  Code Status:  Full code Goals of care: Advanced Directive information Advanced Directives 07/13/2020  Does Patient Have a Medical Advance Directive? Yes  Type of Paramedic of [place];Living will  Does patient want to make changes to medical advance directive? No - Patient declined  Copy of [place] in Chart? Yes - validated most recent copy scanned in chart (See row information)  Would patient like information on creating a medical advance directive? -  Pre-existing out of facility DNR order (yellow form or pink MOST form) -     Chief Complaint  Patient presents with   Acute Visit    Left eye redness    HPI:  Pt is a 87 y.o. female seen today for acute visit for left eye redness.   Patient reports left eye redness and itching. Symptoms began 06/19. She woke up this morning and noticed her symptoms had not resolved. In addition, she reports yellow/green drainage from left eye. She has not tried any interventions. Denies eye pain or changes in vision.   Nurse does not report any concerns, vitals stable.   Past Medical History:  Diagnosis Date    Anemia associated with acute blood loss 04/03/2011   Arthritis    knees    Ascites    Blood transfusion    hx of 6720    Complication of anesthesia    Sodium drops per pt    Cystitis    DIARRHEA, ANTIBIOTIC ASSOCIATED 05/31/2009   Qualifier: Diagnosis of  By: Tommy Medal MD, Cornelius     GERD (gastroesophageal reflux disease)    H/O hiatal hernia    Hyperlipidemia    Hypertension    Hyponatremia    Neutropenia with fever ([place]) 05/18/2011   OSTEOMYELITIS, CHRONIC, LOWER LEG 04/23/2009   Qualifier: Diagnosis of  By: Johnnye Sima MD, Felecia Shelling    OSTEOPOROSIS 04/23/2009   Qualifier: Diagnosis of  By: Johnnye Sima MD, Jeffrey     Ovarian cancer ([place]) 01/25/2011   Pneumonia    hx of    Postmenopausal atrophic vaginitis    PROSTHETIC JOINT COMPLICATION 9/47/0962   Qualifier: Diagnosis of  By: Tommy Medal MD, Cornelius     Renal disorder    Decreased kidney function   Staph infection 2010   after knee replacement   Urinary frequency    Vulvitis    Past Surgical History:  Procedure Laterality Date   ABDOMINAL HYSTERECTOMY  04/01/2011   Procedure: HYSTERECTOMY ABDOMINAL;  Surgeon: Alvino Chapel, MD;  Location: [facility];  Service: Gynecology;  Laterality: N/A;   APPENDECTOMY     JOINT REPLACEMENT     R knee in 2008, 5 operations on L knee   LAPAROTOMY  04/01/2011   Procedure: EXPLORATORY LAPAROTOMY;  Surgeon: Cherly Anderson  Clarke-Pearson, MD;  Location: [facility];  Service: Gynecology;  Laterality: N/A;   OTHER SURGICAL HISTORY     hx of C section 1966   SALPINGOOPHORECTOMY  04/01/2011   Procedure: SALPINGO OOPHERECTOMY;  Surgeon: Alvino Chapel, MD;  Location: [facility];  Service: Gynecology;  Laterality: Bilateral;   TONSILLECTOMY      Allergies  Allergen Reactions   Morphine And Related Anaphylaxis and Other (See Comments)    Given after knee replacement and code blue occurred   Penicillins Hives, Itching and Other (See Comments)    Syncope, also   Nsaids Other (See  Comments)    Because of an abnormal kidney test result   Shellfish Allergy Nausea And Vomiting and Other (See Comments)    Pt has shellfish allergy only.  Has had IV contrast x 2 and did fine.    Outpatient Encounter Medications as of 07/30/2020  Medication Sig   acetaminophen (TYLENOL) 500 MG tablet Take 2 tablets (1,000 mg total) by mouth 3 (three) times daily.   azelastine (ASTELIN) 0.1 % nasal spray Place 1 spray into both nostrils daily as needed for rhinitis or allergies.   B Complex-C-Folic Acid (B-COMPLEX/FOLIC ACID/VITAMIN C PO) Take 1 tablet by mouth daily with breakfast.   calcium carbonate (TUMS EX) 750 MG chewable tablet Chew 750 mg by mouth daily as needed for heartburn.   Cholecalciferol (VITAMIN D3) 50 MCG (2000 UT) TABS Take 2,000 Units by mouth in the morning.   cycloSPORINE (RESTASIS) 0.05 % ophthalmic emulsion Place 1 drop into both eyes 2 (two) times daily.   diclofenac Sodium (VOLTAREN) 1 % GEL Apply 2 g topically 4 (four) times daily.   docusate sodium (COLACE) 100 MG capsule Take 100 mg by mouth daily as needed for mild constipation.   Ensure (ENSURE) Take 237 mLs by mouth. 273mLS., oral, Twice A Day, Give Ensure 237MLs/Boost 237MLS Strawberry Liquid oral per family request W/Breakfast and 2PM to increase Nutritional adequacy. Doc.% consumed. Family will provide.   hydrALAZINE (APRESOLINE) 10 MG tablet Take 10 mg by mouth daily as needed. PRN only if SBP more then 170 /90   meclizine (ANTIVERT) 12.5 MG tablet Take 1 tablet (12.5 mg total) by mouth 2 (two) times daily as needed for dizziness.   melatonin 3 MG TABS tablet Take 1 tablet (3 mg total) by mouth at bedtime.   pantoprazole (PROTONIX) 40 MG tablet Take 1 tablet (40 mg total) by mouth 2 (two) times daily.   sodium chloride 1 g tablet Take 1 tablet (1 g total) by mouth 3 (three) times daily with meals.   zinc oxide 20 % ointment Apply 1 application topically as needed (for redness- to be applied to buttocks/peri  area and after every incontinent episode).   No facility-administered encounter medications on file as of 07/30/2020.    Review of Systems  Constitutional:  Negative for activity change, appetite change, fatigue and fever.  Eyes:  Positive for redness and itching.  Respiratory:  Negative for cough, shortness of breath and wheezing.   Cardiovascular:  Negative for chest pain and leg swelling.  Psychiatric/Behavioral:  Negative for dysphoric mood. The patient is not nervous/anxious.    Immunization History  Administered Date(s) Administered   Influenza Split 11/13/2012, 12/13/2012, 11/30/2013, 10/09/2014, 11/24/2018   Influenza, High Dose Seasonal PF 12/01/2013, 10/04/2014, 10/12/2015, 11/06/2016, 11/12/2017   Influenza,inj,Quad PF,6+ Mos 10/24/2010   Influenza-Unspecified 10/24/2010   Moderna SARS-COV2 Booster Vaccination 07/18/2020   Moderna Sars-Covid-2 Vaccination 02/14/2019, 03/14/2019, 12/26/2019   Pneumococcal  Conjugate-13 09/28/2013   Pneumococcal Polysaccharide-23 03/05/2012   Td 08/27/2000   Tdap 08/27/2000, 10/16/2010   Zoster, Live 01/11/2013, 03/31/2020   Pertinent  Health Maintenance Due  Topic Date Due   INFLUENZA VACCINE  09/10/2020   DEXA SCAN  Completed   PNA vac Low Risk Adult  Completed   Fall Risk  03/02/2014  Falls in the past year? No   Functional Status Survey:    Vitals:   07/30/20 1551  BP: (!) 156/79  Pulse: 95  Resp: 20  Temp: (!) 97.1 F (36.2 C)  SpO2: 97%  Weight: 165 lb (74.8 kg)  Height: 5\' 3"  (1.6 m)   Body mass index is 29.23 kg/m. Physical Exam Vitals reviewed.  Constitutional:      General: She is not in acute distress. HENT:     Head: Normocephalic.  Eyes:     General:        Right eye: No discharge.        Left eye: Discharge present.    Extraocular Movements: Extraocular movements intact.     Pupils: Pupils are equal, round, and reactive to light.     Comments: Left sclera red. Yellow/green drainage present along eye  lashes, corners of eye.    Cardiovascular:     Rate and Rhythm: Normal rate and regular rhythm.     Pulses: Normal pulses.     Heart sounds: Normal heart sounds. No murmur heard. Pulmonary:     Effort: Pulmonary effort is normal. No respiratory distress.     Breath sounds: Normal breath sounds. No wheezing.  Neurological:     General: No focal deficit present.     Mental Status: She is alert and oriented to person, place, and time.  Psychiatric:        Mood and Affect: Mood normal.        Behavior: Behavior normal.    Labs reviewed: Recent Labs    04/22/20 0109 04/23/20 0652 04/24/20 0058 04/28/20 1641 04/29/20 0521 04/30/20 0340 05/03/20 0000 05/10/20 0000 05/14/20 0000  NA 126* 128* 127* 128* 127* 130* 131* 133* 132*  K 4.1 4.2 4.3 5.2* 4.5 4.6 4.7 4.4 4.6  CL 98 96* 97* 97* 98 100 100 100 99  CO2 22 25 25  20* 22 24 24* 23* 24*  GLUCOSE 111* 142* 109* 160* 119* 97  --   --   --   BUN 16 17 23  30* 33* 29* 23* 23* 33*  CREATININE 0.83 0.97 0.97 1.16* 1.37* 1.06* 0.9 0.8 1.0  CALCIUM 8.4* 8.6* 8.3* 9.0 8.9 8.7* 9.1 9.0 9.2  MG 1.6* 1.6* 1.6*  --   --   --   --   --   --   PHOS 2.8 2.6 3.1  --   --  2.8  --   --   --    Recent Labs    04/22/20 0109 04/23/20 0652 04/24/20 0058 04/30/20 0340  AST 22 33 23  --   ALT 16 25 20   --   ALKPHOS 75 103 77  --   BILITOT 1.0 0.9 0.7  --   PROT 6.5 6.9 5.8*  --   ALBUMIN 2.5* 2.5* 2.2* 2.4*   Recent Labs    04/22/20 0109 04/23/20 0652 04/24/20 0058 04/28/20 1641 04/29/20 0521  WBC 8.4 6.7 7.1 12.2* 10.2  NEUTROABS 5.6 4.4 4.2  --   --   HGB 10.8* 11.9* 10.3* 14.6 12.0  HCT 32.5* 34.9* 31.0* 43.5 34.9*  MCV 84.9 83.9 84.9 85.1 83.5  PLT 322 336 332 441* 348   Lab Results  Component Value Date   TSH 2.357 04/06/2020   No results found for: HGBA1C No results found for: CHOL, HDL, LDLCALC, LDLDIRECT, TRIG, CHOLHDL  Significant Diagnostic Results in last 30 days:  No results found.  Assessment/Plan 1. Acute  bacterial conjunctivitis of left eye - left eye sclera with redness, yellow/green drainage, reports itching - start ofloxacin ophthalmic 0.3%- apply 1-2 gtts Q 4 hrs x 2 days, then 1-2 gtts QID x 5 days - clean eyes with cold washcloth bid x 7 days   Family/ staff Communication: plan discussed with patient and nurse  Labs/tests ordered: none

## 2020-07-31 DIAGNOSIS — R55 Syncope and collapse: Secondary | ICD-10-CM | POA: Diagnosis not present

## 2020-07-31 DIAGNOSIS — I10 Essential (primary) hypertension: Secondary | ICD-10-CM | POA: Diagnosis not present

## 2020-07-31 DIAGNOSIS — N183 Chronic kidney disease, stage 3 unspecified: Secondary | ICD-10-CM | POA: Diagnosis not present

## 2020-07-31 DIAGNOSIS — C569 Malignant neoplasm of unspecified ovary: Secondary | ICD-10-CM | POA: Diagnosis not present

## 2020-07-31 DIAGNOSIS — D62 Acute posthemorrhagic anemia: Secondary | ICD-10-CM | POA: Diagnosis not present

## 2020-07-31 DIAGNOSIS — E871 Hypo-osmolality and hyponatremia: Secondary | ICD-10-CM | POA: Diagnosis not present

## 2020-08-01 DIAGNOSIS — R55 Syncope and collapse: Secondary | ICD-10-CM | POA: Diagnosis not present

## 2020-08-01 DIAGNOSIS — E871 Hypo-osmolality and hyponatremia: Secondary | ICD-10-CM | POA: Diagnosis not present

## 2020-08-01 DIAGNOSIS — C569 Malignant neoplasm of unspecified ovary: Secondary | ICD-10-CM | POA: Diagnosis not present

## 2020-08-01 DIAGNOSIS — D62 Acute posthemorrhagic anemia: Secondary | ICD-10-CM | POA: Diagnosis not present

## 2020-08-01 DIAGNOSIS — N183 Chronic kidney disease, stage 3 unspecified: Secondary | ICD-10-CM | POA: Diagnosis not present

## 2020-08-01 DIAGNOSIS — I10 Essential (primary) hypertension: Secondary | ICD-10-CM | POA: Diagnosis not present

## 2020-08-02 DIAGNOSIS — C569 Malignant neoplasm of unspecified ovary: Secondary | ICD-10-CM | POA: Diagnosis not present

## 2020-08-02 DIAGNOSIS — N183 Chronic kidney disease, stage 3 unspecified: Secondary | ICD-10-CM | POA: Diagnosis not present

## 2020-08-02 DIAGNOSIS — R55 Syncope and collapse: Secondary | ICD-10-CM | POA: Diagnosis not present

## 2020-08-02 DIAGNOSIS — I10 Essential (primary) hypertension: Secondary | ICD-10-CM | POA: Diagnosis not present

## 2020-08-02 DIAGNOSIS — E871 Hypo-osmolality and hyponatremia: Secondary | ICD-10-CM | POA: Diagnosis not present

## 2020-08-02 DIAGNOSIS — D62 Acute posthemorrhagic anemia: Secondary | ICD-10-CM | POA: Diagnosis not present

## 2020-08-03 DIAGNOSIS — E871 Hypo-osmolality and hyponatremia: Secondary | ICD-10-CM | POA: Diagnosis not present

## 2020-08-03 DIAGNOSIS — C569 Malignant neoplasm of unspecified ovary: Secondary | ICD-10-CM | POA: Diagnosis not present

## 2020-08-03 DIAGNOSIS — D62 Acute posthemorrhagic anemia: Secondary | ICD-10-CM | POA: Diagnosis not present

## 2020-08-03 DIAGNOSIS — R55 Syncope and collapse: Secondary | ICD-10-CM | POA: Diagnosis not present

## 2020-08-03 DIAGNOSIS — I10 Essential (primary) hypertension: Secondary | ICD-10-CM | POA: Diagnosis not present

## 2020-08-03 DIAGNOSIS — N183 Chronic kidney disease, stage 3 unspecified: Secondary | ICD-10-CM | POA: Diagnosis not present

## 2020-08-06 DIAGNOSIS — C569 Malignant neoplasm of unspecified ovary: Secondary | ICD-10-CM | POA: Diagnosis not present

## 2020-08-06 DIAGNOSIS — E871 Hypo-osmolality and hyponatremia: Secondary | ICD-10-CM | POA: Diagnosis not present

## 2020-08-06 DIAGNOSIS — I10 Essential (primary) hypertension: Secondary | ICD-10-CM | POA: Diagnosis not present

## 2020-08-06 DIAGNOSIS — N183 Chronic kidney disease, stage 3 unspecified: Secondary | ICD-10-CM | POA: Diagnosis not present

## 2020-08-06 DIAGNOSIS — R55 Syncope and collapse: Secondary | ICD-10-CM | POA: Diagnosis not present

## 2020-08-06 DIAGNOSIS — D62 Acute posthemorrhagic anemia: Secondary | ICD-10-CM | POA: Diagnosis not present

## 2020-08-07 DIAGNOSIS — N183 Chronic kidney disease, stage 3 unspecified: Secondary | ICD-10-CM | POA: Diagnosis not present

## 2020-08-07 DIAGNOSIS — E871 Hypo-osmolality and hyponatremia: Secondary | ICD-10-CM | POA: Diagnosis not present

## 2020-08-07 DIAGNOSIS — D62 Acute posthemorrhagic anemia: Secondary | ICD-10-CM | POA: Diagnosis not present

## 2020-08-07 DIAGNOSIS — I10 Essential (primary) hypertension: Secondary | ICD-10-CM | POA: Diagnosis not present

## 2020-08-07 DIAGNOSIS — C569 Malignant neoplasm of unspecified ovary: Secondary | ICD-10-CM | POA: Diagnosis not present

## 2020-08-07 DIAGNOSIS — R55 Syncope and collapse: Secondary | ICD-10-CM | POA: Diagnosis not present

## 2020-08-08 DIAGNOSIS — N183 Chronic kidney disease, stage 3 unspecified: Secondary | ICD-10-CM | POA: Diagnosis not present

## 2020-08-08 DIAGNOSIS — R55 Syncope and collapse: Secondary | ICD-10-CM | POA: Diagnosis not present

## 2020-08-08 DIAGNOSIS — D62 Acute posthemorrhagic anemia: Secondary | ICD-10-CM | POA: Diagnosis not present

## 2020-08-08 DIAGNOSIS — E871 Hypo-osmolality and hyponatremia: Secondary | ICD-10-CM | POA: Diagnosis not present

## 2020-08-08 DIAGNOSIS — C569 Malignant neoplasm of unspecified ovary: Secondary | ICD-10-CM | POA: Diagnosis not present

## 2020-08-08 DIAGNOSIS — I10 Essential (primary) hypertension: Secondary | ICD-10-CM | POA: Diagnosis not present

## 2020-08-09 ENCOUNTER — Encounter: Payer: Self-pay | Admitting: Internal Medicine

## 2020-08-09 ENCOUNTER — Non-Acute Institutional Stay (SKILLED_NURSING_FACILITY): Payer: Medicare Other | Admitting: Internal Medicine

## 2020-08-09 DIAGNOSIS — R42 Dizziness and giddiness: Secondary | ICD-10-CM | POA: Diagnosis not present

## 2020-08-09 DIAGNOSIS — D62 Acute posthemorrhagic anemia: Secondary | ICD-10-CM | POA: Diagnosis not present

## 2020-08-09 DIAGNOSIS — N183 Chronic kidney disease, stage 3 unspecified: Secondary | ICD-10-CM | POA: Diagnosis not present

## 2020-08-09 DIAGNOSIS — I10 Essential (primary) hypertension: Secondary | ICD-10-CM | POA: Diagnosis not present

## 2020-08-09 DIAGNOSIS — K219 Gastro-esophageal reflux disease without esophagitis: Secondary | ICD-10-CM | POA: Diagnosis not present

## 2020-08-09 DIAGNOSIS — C569 Malignant neoplasm of unspecified ovary: Secondary | ICD-10-CM | POA: Diagnosis not present

## 2020-08-09 DIAGNOSIS — E871 Hypo-osmolality and hyponatremia: Secondary | ICD-10-CM

## 2020-08-09 DIAGNOSIS — H1032 Unspecified acute conjunctivitis, left eye: Secondary | ICD-10-CM

## 2020-08-09 DIAGNOSIS — R55 Syncope and collapse: Secondary | ICD-10-CM | POA: Diagnosis not present

## 2020-08-09 DIAGNOSIS — F5101 Primary insomnia: Secondary | ICD-10-CM

## 2020-08-09 DIAGNOSIS — I4892 Unspecified atrial flutter: Secondary | ICD-10-CM

## 2020-08-09 DIAGNOSIS — R4189 Other symptoms and signs involving cognitive functions and awareness: Secondary | ICD-10-CM

## 2020-08-09 NOTE — Progress Notes (Signed)
Location:   [facility] Room Number: [place] of Service:  SNF [phone]) Provider:  Veleta Miners MD  Virgie Dad, MD  Patient Care Team: Virgie Dad, MD as PCP - General (Internal Medicine) Donato Heinz, MD as PCP - Cardiology (Cardiology) Brien Few, MD as Consulting Physician (Obstetrics and Gynecology) Marti Sleigh, MD as Consulting Physician (Gynecology) Nancy Marus, MD as Consulting Physician (Gynecologic Oncology)  Extended Emergency Contact Information Primary Emergency Contact: [facility] Address: [address]          [place], [place] [address] [name] of [place] Phone: [phone] Mobile Phone: [phone] Relation: Daughter Secondary Emergency Contact: [place] Mobile Phone: [phone] Relation: Son  Code Status:  Full Code Goals of care: Advanced Directive information Advanced Directives 08/09/2020  Does Patient Have a Medical Advance Directive? Yes  Type of Paramedic of [place];Living will  Does patient want to make changes to medical advance directive? No - Patient declined  Copy of [place] in Chart? Yes - validated most recent copy scanned in chart (See row information)  Would patient like information on creating a medical advance directive? -  Pre-existing out of facility DNR order (yellow form or pink MOST form) -     Chief Complaint  Patient presents with   Medical Management of Chronic Issues   Health Maintenance    Shingrix    HPI:  Pt is a 87 y.o. female seen today for medical management of chronic diseases.    Admitted in the hospital from 3/19-3/21 for hypotension and syncope Also in 3/7-3/16 for Syncope and also from 2/25-  3/2 after having syncope due to orthostatic hypotension During admission patient was found to be orthostatic.  All her antihypertensives were stopped.  She also was hyponatremic.  She is now on sodium  tablets and fluid restriction  Seen in her room. Doing well in SNF Needs Mild assist with her ADLS Has Gained weight Appetite is good. Using [employer]. Work with therapy Denies any Dizziness or falls   Past Medical History:  Diagnosis Date   Anemia associated with acute blood loss 04/03/2011   Arthritis    knees    Ascites    Blood transfusion    hx of 7341    Complication of anesthesia    Sodium drops per pt    Cystitis    DIARRHEA, ANTIBIOTIC ASSOCIATED 05/31/2009   Qualifier: Diagnosis of  By: Tommy Medal MD, Cornelius     GERD (gastroesophageal reflux disease)    H/O hiatal hernia    Hyperlipidemia    Hypertension    Hyponatremia    Neutropenia with fever ([place]) 05/18/2011   OSTEOMYELITIS, CHRONIC, LOWER LEG 04/23/2009   Qualifier: Diagnosis of  By: Johnnye Sima MD, Felecia Shelling    OSTEOPOROSIS 04/23/2009   Qualifier: Diagnosis of  By: Johnnye Sima MD, Jeffrey     Ovarian cancer ([place]) 01/25/2011   Pneumonia    hx of    Postmenopausal atrophic vaginitis    PROSTHETIC JOINT COMPLICATION 9/37/9024   Qualifier: Diagnosis of  By: Tommy Medal MD, Cornelius     Renal disorder    Decreased kidney function   Staph infection 2010   after knee replacement   Urinary frequency    Vulvitis    Past Surgical History:  Procedure Laterality Date   ABDOMINAL HYSTERECTOMY  04/01/2011   Procedure: HYSTERECTOMY ABDOMINAL;  Surgeon: Alvino Chapel, MD;  Location:  [facility];  Service: Gynecology;  Laterality: N/A;   APPENDECTOMY     JOINT REPLACEMENT     R knee in 2008, 5 operations on L knee   LAPAROTOMY  04/01/2011   Procedure: EXPLORATORY LAPAROTOMY;  Surgeon: Alvino Chapel, MD;  Location: [facility];  Service: Gynecology;  Laterality: N/A;   OTHER SURGICAL HISTORY     hx of C section 1966   SALPINGOOPHORECTOMY  04/01/2011   Procedure: SALPINGO OOPHERECTOMY;  Surgeon: Alvino Chapel, MD;  Location: [facility];  Service: Gynecology;  Laterality: Bilateral;    TONSILLECTOMY      Allergies  Allergen Reactions   Morphine And Related Anaphylaxis and Other (See Comments)    Given after knee replacement and code blue occurred   Penicillins Hives, Itching and Other (See Comments)    Syncope, also   Nsaids Other (See Comments)    Because of an abnormal kidney test result   Shellfish Allergy Nausea And Vomiting and Other (See Comments)    Pt has shellfish allergy only.  Has had IV contrast x 2 and did fine.    Allergies as of 08/09/2020       Reactions   Morphine And Related Anaphylaxis, Other (See Comments)   Given after knee replacement and code blue occurred   Penicillins Hives, Itching, Other (See Comments)   Syncope, also   Nsaids Other (See Comments)   Because of an abnormal kidney test result   Shellfish Allergy Nausea And Vomiting, Other (See Comments)   Pt has shellfish allergy only.  Has had IV contrast x 2 and did fine.        Medication List        Accurate as of August 09, 2020 11:14 AM. If you have any questions, ask your nurse or doctor.          acetaminophen 500 MG tablet Commonly known as: TYLENOL Take 2 tablets (1,000 mg total) by mouth 3 (three) times daily.   azelastine 0.1 % nasal spray Commonly known as: ASTELIN Place 1 spray into both nostrils daily as needed for rhinitis or allergies.   B-COMPLEX/FOLIC ACID/VITAMIN C PO Take 1 tablet by mouth daily with breakfast.   calcium carbonate 750 MG chewable tablet Commonly known as: TUMS EX Chew 750 mg by mouth daily as needed for heartburn.   cycloSPORINE 0.05 % ophthalmic emulsion Commonly known as: RESTASIS Place 1 drop into both eyes 2 (two) times daily.   diclofenac Sodium 1 % Gel Commonly known as: VOLTAREN Apply 2 g topically 4 (four) times daily.   docusate sodium 100 MG capsule Commonly known as: COLACE Take 100 mg by mouth daily as needed for mild constipation.   Ensure Take 237 mLs by mouth. 237mLS., oral, Twice A Day, Give Ensure  237MLs/Boost 237MLS Strawberry Liquid oral per family request W/Breakfast and 2PM to increase Nutritional adequacy. Doc.% consumed. Family will provide.   hydrALAZINE 10 MG tablet Commonly known as: APRESOLINE Take 10 mg by mouth 2 (two) times daily as needed. PRN only if SBP more then 170 /90   meclizine 12.5 MG tablet Commonly known as: ANTIVERT Take 1 tablet (12.5 mg total) by mouth 2 (two) times daily as needed for dizziness.   melatonin 3 MG Tabs tablet Take 1 tablet (3 mg total) by mouth at bedtime.   pantoprazole 40 MG tablet Commonly known as: PROTONIX Take 1 tablet (40 mg total) by mouth 2 (two) times daily.   sodium chloride 1 g tablet Take  1 tablet (1 g total) by mouth 3 (three) times daily with meals.   Vitamin D3 50 MCG (2000 UT) Tabs Take 2,000 Units by mouth in the morning.   zinc oxide 20 % ointment Apply 1 application topically as needed (for redness- to be applied to buttocks/peri area and after every incontinent episode).        Review of Systems Review of Systems  Constitutional: Negative for activity change, appetite change, chills, diaphoresis, fatigue and fever.  HENT: Negative for mouth sores, postnasal drip, rhinorrhea, sinus pain and sore throat.   Respiratory: Negative for apnea, cough, chest tightness, shortness of breath and wheezing.   Cardiovascular: Negative for chest pain, palpitations and leg swelling.  Gastrointestinal: Negative for abdominal distention, abdominal pain, constipation, diarrhea, nausea and vomiting.  Genitourinary: Negative for dysuria and frequency.  Musculoskeletal: Negative for arthralgias, joint swelling and myalgias.  Skin: Negative for rash.  Neurological: Negative for dizziness, syncope, weakness, light-headedness and numbness.  Psychiatric/Behavioral: Negative for behavioral problems, confusion and sleep disturbance.    Immunization History  Administered Date(s) Administered   Influenza Split 11/13/2012,  12/13/2012, 11/30/2013, 10/09/2014, 11/24/2018   Influenza, High Dose Seasonal PF 12/01/2013, 10/04/2014, 10/12/2015, 11/06/2016, 11/12/2017   Influenza,inj,Quad PF,6+ Mos 10/24/2010   Influenza-Unspecified 10/24/2010   Moderna SARS-COV2 Booster Vaccination 07/18/2020   Moderna Sars-Covid-2 Vaccination 02/14/2019, 03/14/2019, 12/26/2019   Pneumococcal Conjugate-13 09/28/2013   Pneumococcal Polysaccharide-23 03/05/2012   Td 08/27/2000   Tdap 08/27/2000, 10/16/2010   Zoster, Live 01/11/2013, 03/31/2020   Pertinent  Health Maintenance Due  Topic Date Due   INFLUENZA VACCINE  09/10/2020   DEXA SCAN  Completed   PNA vac Low Risk Adult  Completed   Fall Risk  03/02/2014  Falls in the past year? No   Functional Status Survey:    Vitals:   08/09/20 1059  BP: (!) 152/84  Pulse: 84  Resp: 20  Temp: (!) 97.1 F (36.2 C)  SpO2: 93%  Weight: 165 lb (74.8 kg)  Height: 5\' 3"  (1.6 m)   Body mass index is 29.23 kg/m. Physical Exam Constitutional: Oriented to person, place, and time. Well-developed and well-nourished.  HENT:  Head: Normocephalic.  Mouth/Throat: Oropharynx is clear and moist.  Eyes: Pupils are equal, round, and reactive to light. Some redness in Left Eye Neck: Neck supple.  Cardiovascular: Normal rate and normal heart sounds.  No murmur heard. Pulmonary/Chest: Effort normal and breath sounds normal. No respiratory distress. No wheezes. She has no rales.  Abdominal: Soft. Bowel sounds are normal. No distension. There is no tenderness. There is no rebound.  Musculoskeletal: Mild Edema   Lymphadenopathy: none Neurological: Alert and oriented to person, place, and time.  Skin: Skin is warm and dry.  Psychiatric: Normal mood and affect. Behavior is normal. Thought content normal.    Labs reviewed: Recent Labs    04/22/20 0109 04/23/20 0652 04/24/20 0058 04/28/20 1641 04/29/20 0521 04/30/20 0340 05/03/20 0000 05/10/20 0000 05/14/20 0000 06/11/20 0000  NA  126* 128* 127* 128* 127* 130*   < > 133* 132* 135*  K 4.1 4.2 4.3 5.2* 4.5 4.6   < > 4.4 4.6 4.5  CL 98 96* 97* 97* 98 100   < > 100 99 99  CO2 22 25 25  20* 22 24   < > 23* 24* 28*  GLUCOSE 111* 142* 109* 160* 119* 97  --   --   --   --   BUN 16 17 23  30* 33* 29*   < >  23* 33* 21  CREATININE 0.83 0.97 0.97 1.16* 1.37* 1.06*   < > 0.8 1.0 0.9  CALCIUM 8.4* 8.6* 8.3* 9.0 8.9 8.7*   < > 9.0 9.2 9.6  MG 1.6* 1.6* 1.6*  --   --   --   --   --   --   --   PHOS 2.8 2.6 3.1  --   --  2.8  --   --   --   --    < > = values in this interval not displayed.   Recent Labs    04/22/20 0109 04/23/20 0652 04/24/20 0058 04/30/20 0340 06/11/20 0000  AST 22 33 23  --  26  ALT 16 25 20   --  14  ALKPHOS 75 103 77  --  86  BILITOT 1.0 0.9 0.7  --   --   PROT 6.5 6.9 5.8*  --   --   ALBUMIN 2.5* 2.5* 2.2* 2.4* 3.4*   Recent Labs    04/22/20 0109 04/23/20 0652 04/24/20 0058 04/28/20 1641 04/29/20 0521  WBC 8.4 6.7 7.1 12.2* 10.2  NEUTROABS 5.6 4.4 4.2  --   --   HGB 10.8* 11.9* 10.3* 14.6 12.0  HCT 32.5* 34.9* 31.0* 43.5 34.9*  MCV 84.9 83.9 84.9 85.1 83.5  PLT 322 336 332 441* 348   Lab Results  Component Value Date   TSH 2.357 04/06/2020   No results found for: HGBA1C No results found for: CHOL, HDL, LDLCALC, LDLDIRECT, TRIG, CHOLHDL  Significant Diagnostic Results in last 30 days:  No results found.  Assessment/Plan Hyponatremia Change Sodium tabs to 2/day  Repeat BMP in 2 weeks Acute bacterial conjunctivitis of left eye Will restart Ofloxacin for 2 weeks Essential hypertension Still using Hydralazine PRN due to her Hypotension and Dizziness Atrial flutter, unspecified type (HCC) No Anticoagulation due to Falls and Hypotension Dizziness Staying good GERD On Protonix BID Cognitive impairment Supportive care for now Primary insomnia On Melatonin Family/ staff Communication:   Labs/tests ordered:  BMP in 2 weeks

## 2020-08-10 DIAGNOSIS — R55 Syncope and collapse: Secondary | ICD-10-CM | POA: Diagnosis not present

## 2020-08-10 DIAGNOSIS — R2681 Unsteadiness on feet: Secondary | ICD-10-CM | POA: Diagnosis not present

## 2020-08-10 DIAGNOSIS — I951 Orthostatic hypotension: Secondary | ICD-10-CM | POA: Diagnosis not present

## 2020-08-10 DIAGNOSIS — I10 Essential (primary) hypertension: Secondary | ICD-10-CM | POA: Diagnosis not present

## 2020-08-10 DIAGNOSIS — M6281 Muscle weakness (generalized): Secondary | ICD-10-CM | POA: Diagnosis not present

## 2020-08-10 DIAGNOSIS — R2689 Other abnormalities of gait and mobility: Secondary | ICD-10-CM | POA: Diagnosis not present

## 2020-08-13 DIAGNOSIS — I10 Essential (primary) hypertension: Secondary | ICD-10-CM | POA: Diagnosis not present

## 2020-08-13 DIAGNOSIS — M6281 Muscle weakness (generalized): Secondary | ICD-10-CM | POA: Diagnosis not present

## 2020-08-13 DIAGNOSIS — R2681 Unsteadiness on feet: Secondary | ICD-10-CM | POA: Diagnosis not present

## 2020-08-13 DIAGNOSIS — R2689 Other abnormalities of gait and mobility: Secondary | ICD-10-CM | POA: Diagnosis not present

## 2020-08-13 DIAGNOSIS — I951 Orthostatic hypotension: Secondary | ICD-10-CM | POA: Diagnosis not present

## 2020-08-13 DIAGNOSIS — R55 Syncope and collapse: Secondary | ICD-10-CM | POA: Diagnosis not present

## 2020-08-14 DIAGNOSIS — R55 Syncope and collapse: Secondary | ICD-10-CM | POA: Diagnosis not present

## 2020-08-14 DIAGNOSIS — R2689 Other abnormalities of gait and mobility: Secondary | ICD-10-CM | POA: Diagnosis not present

## 2020-08-14 DIAGNOSIS — R2681 Unsteadiness on feet: Secondary | ICD-10-CM | POA: Diagnosis not present

## 2020-08-14 DIAGNOSIS — I10 Essential (primary) hypertension: Secondary | ICD-10-CM | POA: Diagnosis not present

## 2020-08-14 DIAGNOSIS — I951 Orthostatic hypotension: Secondary | ICD-10-CM | POA: Diagnosis not present

## 2020-08-14 DIAGNOSIS — M6281 Muscle weakness (generalized): Secondary | ICD-10-CM | POA: Diagnosis not present

## 2020-08-16 ENCOUNTER — Non-Acute Institutional Stay (INDEPENDENT_AMBULATORY_CARE_PROVIDER_SITE_OTHER): Payer: Medicare Other | Admitting: Orthopedic Surgery

## 2020-08-16 ENCOUNTER — Encounter: Payer: Self-pay | Admitting: Orthopedic Surgery

## 2020-08-16 DIAGNOSIS — M6281 Muscle weakness (generalized): Secondary | ICD-10-CM | POA: Diagnosis not present

## 2020-08-16 DIAGNOSIS — R2689 Other abnormalities of gait and mobility: Secondary | ICD-10-CM | POA: Diagnosis not present

## 2020-08-16 DIAGNOSIS — R2681 Unsteadiness on feet: Secondary | ICD-10-CM | POA: Diagnosis not present

## 2020-08-16 DIAGNOSIS — Z0000 Encounter for general adult medical examination without abnormal findings: Secondary | ICD-10-CM

## 2020-08-16 DIAGNOSIS — L570 Actinic keratosis: Secondary | ICD-10-CM | POA: Diagnosis not present

## 2020-08-16 DIAGNOSIS — L814 Other melanin hyperpigmentation: Secondary | ICD-10-CM | POA: Diagnosis not present

## 2020-08-16 DIAGNOSIS — I951 Orthostatic hypotension: Secondary | ICD-10-CM | POA: Diagnosis not present

## 2020-08-16 DIAGNOSIS — R55 Syncope and collapse: Secondary | ICD-10-CM | POA: Diagnosis not present

## 2020-08-16 DIAGNOSIS — L821 Other seborrheic keratosis: Secondary | ICD-10-CM | POA: Diagnosis not present

## 2020-08-16 DIAGNOSIS — I10 Essential (primary) hypertension: Secondary | ICD-10-CM | POA: Diagnosis not present

## 2020-08-16 NOTE — Progress Notes (Signed)
Subjective:   [name] is a 87 y.o. female who presents for Medicare Annual (Subsequent) preventive examination.  Review of Systems     Cardiac Risk Factors include: advanced age (>39men, >26 women);hypertension;dyslipidemia;smoking/ tobacco exposure     Objective:    Today's Vitals   08/16/20 1019  BP: (!) 146/72  Pulse: 81  Resp: 14  Temp: (!) 97.5 F (36.4 C)  SpO2: 97%  Weight: 168 lb (76.2 kg)  Height: 5\' 3"  (1.6 m)   Body mass index is 29.76 kg/m.  Advanced Directives 08/16/2020 08/16/2020 08/09/2020 07/13/2020 05/30/2020 05/18/2020 05/02/2020  Does Patient Have a Medical Advance Directive? Yes Yes Yes Yes Yes Yes Yes  Type of Paramedic of [place];Living will [place];Living will [place];Living will [place];Living will [place];Living will Living will;Healthcare Power of Attorney Living will;Healthcare Power of Attorney  Does patient want to make changes to medical advance directive? No - Patient declined No - Patient declined No - Patient declined No - Patient declined No - Patient declined No - Patient declined -  Copy of [place] in Chart? Yes - validated most recent copy scanned in chart (See row information) Yes - validated most recent copy scanned in chart (See row information) Yes - validated most recent copy scanned in chart (See row information) Yes - validated most recent copy scanned in chart (See row information) Yes - validated most recent copy scanned in chart (See row information) Yes - validated most recent copy scanned in chart (See row information) Yes - validated most recent copy scanned in chart (See row information)  Would patient like information on creating a medical advance directive? - - - - - - -  Pre-existing out of facility DNR order (yellow form or pink MOST form) - - - - - - -    Current Medications (verified) Outpatient  Encounter Medications as of 08/16/2020  Medication Sig   acetaminophen (TYLENOL) 500 MG tablet Take 2 tablets (1,000 mg total) by mouth 3 (three) times daily.   azelastine (ASTELIN) 0.1 % nasal spray Place 1 spray into both nostrils daily as needed for rhinitis or allergies.   B Complex-C-Folic Acid (B-COMPLEX/FOLIC ACID/VITAMIN C PO) Take 1 tablet by mouth daily with breakfast.   calcium carbonate (TUMS EX) 750 MG chewable tablet Chew 750 mg by mouth daily as needed for heartburn.   Cholecalciferol (VITAMIN D3) 50 MCG (2000 UT) TABS Take 2,000 Units by mouth in the morning.   cycloSPORINE (RESTASIS) 0.05 % ophthalmic emulsion Place 1 drop into both eyes 2 (two) times daily.   diclofenac Sodium (VOLTAREN) 1 % GEL Apply 2 g topically 4 (four) times daily.   docusate sodium (COLACE) 100 MG capsule Take 100 mg by mouth daily as needed for mild constipation.   Ensure (ENSURE) Take 237 mLs by mouth. 236mLS., oral, Twice A Day, Give Ensure 237MLs/Boost 237MLS Strawberry Liquid oral per family request W/Breakfast and 2PM to increase Nutritional adequacy. Doc.% consumed. Family will provide.   hydrALAZINE (APRESOLINE) 10 MG tablet Take 10 mg by mouth 2 (two) times daily as needed. PRN only if SBP more then 170 /90   meclizine (ANTIVERT) 12.5 MG tablet Take 1 tablet (12.5 mg total) by mouth 2 (two) times daily as needed for dizziness.   melatonin 3 MG TABS tablet Take 1 tablet (3 mg total) by mouth at bedtime.   ofloxacin (OCUFLOX) 0.3 % ophthalmic solution Place  1 drop into the left eye in the morning and at bedtime.   pantoprazole (PROTONIX) 40 MG tablet Take 1 tablet (40 mg total) by mouth 2 (two) times daily.   sodium chloride 1 g tablet Take 1 g by mouth 2 (two) times daily.   zinc oxide 20 % ointment Apply 1 application topically as needed (for redness- to be applied to buttocks/peri area and after every incontinent episode).   [DISCONTINUED] sodium chloride 1 g tablet Take 1 tablet (1 g total) by mouth  3 (three) times daily with meals. (Patient taking differently: Take 1 g by mouth 2 (two) times daily with a meal.)   No facility-administered encounter medications on file as of 08/16/2020.    Allergies (verified) Morphine and related, Penicillins, Nsaids, and Shellfish allergy   History: Past Medical History:  Diagnosis Date   Anemia associated with acute blood loss 04/03/2011   Arthritis    knees    Ascites    Blood transfusion    hx of [id number]    Complication of anesthesia    Sodium drops per pt    Cystitis    DIARRHEA, ANTIBIOTIC ASSOCIATED 05/31/2009   Qualifier: Diagnosis of  By: Tommy Medal MD, Cornelius     GERD (gastroesophageal reflux disease)    H/O hiatal hernia    Hyperlipidemia    Hypertension    Hyponatremia    Neutropenia with fever ([place]) 05/18/2011   OSTEOMYELITIS, CHRONIC, LOWER LEG 04/23/2009   Qualifier: Diagnosis of  By: Johnnye Sima MD, Felecia Shelling    OSTEOPOROSIS 04/23/2009   Qualifier: Diagnosis of  By: Johnnye Sima MD, Jeffrey     Ovarian cancer ([place]) 01/25/2011   Pneumonia    hx of    Postmenopausal atrophic vaginitis    PROSTHETIC JOINT COMPLICATION 6/43/3295   Qualifier: Diagnosis of  By: Tommy Medal MD, Cornelius     Renal disorder    Decreased kidney function   Staph infection 2010   after knee replacement   Urinary frequency    Vulvitis    Past Surgical History:  Procedure Laterality Date   ABDOMINAL HYSTERECTOMY  04/01/2011   Procedure: HYSTERECTOMY ABDOMINAL;  Surgeon: Alvino Chapel, MD;  Location: [facility];  Service: Gynecology;  Laterality: N/A;   APPENDECTOMY     JOINT REPLACEMENT     R knee in 2008, 5 operations on L knee   LAPAROTOMY  04/01/2011   Procedure: EXPLORATORY LAPAROTOMY;  Surgeon: Alvino Chapel, MD;  Location: [facility];  Service: Gynecology;  Laterality: N/A;   OTHER SURGICAL HISTORY     hx of C section 1966   SALPINGOOPHORECTOMY  04/01/2011   Procedure: SALPINGO OOPHERECTOMY;  Surgeon: Alvino Chapel, MD;   Location: [facility];  Service: Gynecology;  Laterality: Bilateral;   TONSILLECTOMY     Family History  Problem Relation Age of Onset   Cancer Other        Bladder cancer   Heart attack Brother    Diabetes Brother    Social History   Socioeconomic History   Marital status: Widowed    Spouse name: Not on file   Number of children: Not on file   Years of education: Not on file   Highest education level: Not on file  Occupational History   Not on file  Tobacco Use   Smoking status: Former    Pack years: 0.00    Types: Cigarettes    Quit date: 02/11/1952    Years since quitting:  68.5   Smokeless tobacco: Never  Vaping Use   Vaping Use: Never used  Substance and Sexual Activity   Alcohol use: Not Currently   Drug use: Never   Sexual activity: Never  Other Topics Concern   Not on file  Social History Narrative   Not on file   Social Determinants of Health   Financial Resource Strain: Low Risk    Difficulty of Paying Living Expenses: Not hard at all  Food Insecurity: No Food Insecurity   Worried About Charity fundraiser in the Last Year: Never true   [place] in the Last Year: Never true  Transportation Needs: No Transportation Needs   Lack of Transportation (Medical): No   Lack of Transportation (Non-Medical): No  Physical Activity: Sufficiently Active   Days of Exercise per Week: 7 days   Minutes of Exercise per Session: 30 min  Stress: No Stress Concern Present   Feeling of Stress : Only a little  Social Connections: Socially Isolated   Frequency of Communication with Friends and Family: More than three times a week   Frequency of Social Gatherings with Friends and Family: Three times a week   Attends Religious Services: Never   Active Member of Clubs or Organizations: No   Attends Archivist Meetings: Never   Marital Status: Widowed    Tobacco Counseling Counseling given: Not Answered   Clinical Intake:  Pre-visit preparation completed:  Yes  Pain : No/denies pain BMI - recorded: 29.76 Nutritional Status: BMI 25 -29 Overweight Nutritional Risks: None Diabetes: No  How often do you need to have someone help you when you read instructions, pamphlets, or other written materials from your doctor or pharmacy?: 3 - Sometimes What is the last grade level you completed in school?: 2 years college  Diabetic?No  Interpreter Needed?: No      Activities of Daily Living In your present state of health, do you have any difficulty performing the following activities: 08/16/2020 04/29/2020  Hearing? Tempie Donning  Vision? N N  Difficulty concentrating or making decisions? N Y  Walking or climbing stairs? Y Y  Comment ambulates with motorized wheelchair -  Dressing or bathing? Y Y  Doing errands, shopping? Tempie Donning  Preparing Food and eating ? Y -  Using the Toilet? Y -  In the past six months, have you accidently leaked urine? Y -  Do you have problems with loss of bowel control? Y -  Managing your Medications? Y -  Managing your Finances? Y -  Housekeeping or managing your Housekeeping? Y -  Some recent data might be hidden    Patient Care Team: Virgie Dad, MD as PCP - General (Internal Medicine) Donato Heinz, MD as PCP - Cardiology (Cardiology) Brien Few, MD as Consulting Physician (Obstetrics and Gynecology) Marti Sleigh, MD as Consulting Physician (Gynecology) Nancy Marus, MD as Consulting Physician (Gynecologic Oncology)  Indicate any recent Medical Services you may have received from other than [facility] providers in the past year (date may be approximate).     Assessment:   This is a routine wellness examination for [place].  Hearing/Vision screen No results found.  Dietary issues and exercise activities discussed: Current Exercise Habits: Structured exercise class, Type of exercise: strength training/weights, Time (Minutes): 30, Frequency (Times/Week): 4, Weekly Exercise (Minutes/Week): 120,  Intensity: Mild, Exercise limited by: orthopedic condition(s)   Goals Addressed             This Visit's Progress  DIET - INCREASE WATER INTAKE   On track      Depression Screen PHQ 2/9 Scores 08/16/2020  PHQ - 2 Score 0    Fall Risk Fall Risk  08/16/2020 03/02/2014  Falls in the past year? 1 No  Number falls in past yr: 1 -  Injury with Fall? 0 -  Risk for fall due to : History of fall(s);Impaired balance/gait;Impaired mobility;No Fall Risks -  Follow up Falls evaluation completed;Education provided;Falls prevention discussed -    FALL RISK PREVENTION PERTAINING TO THE HOME:  Any stairs in or around the home? No  If so, are there any without handrails? Yes  Home free of loose throw rugs in walkways, pet beds, electrical cords, etc? Yes  Adequate lighting in your home to reduce risk of falls? Yes   ASSISTIVE DEVICES UTILIZED TO PREVENT FALLS:  Life alert? Yes  Use of a cane, walker or w/c? Yes  Grab bars in the bathroom? Yes  Shower chair or bench in shower? Yes  Elevated toilet seat or a handicapped toilet? Yes   TIMED UP AND GO:  Was the test performed? No .  Length of time to ambulate 10 feet:  sec.   Gait unsteady without use of assistive device, provider informed and interventions were implemented  Cognitive Function: MMSE - Mini Mental State Exam 08/16/2020  Orientation to time 5  Orientation to Place 5  Registration 3  Attention/ Calculation 5  Recall 3  Language- name 2 objects 2  Language- repeat 1  Language- follow 3 step command 3  Language- read & follow direction 1  Write a sentence 1  Copy design 1  Total score 30     6CIT Screen 08/16/2020  What Year? 0 points  What month? 0 points  What time? 0 points  Count back from 20 0 points  Months in reverse 0 points  Repeat phrase 0 points  Total Score 0    Immunizations Immunization History  Administered Date(s) Administered   Influenza Split 11/13/2012, 12/13/2012, 11/30/2013, 10/09/2014,  11/24/2018   Influenza, High Dose Seasonal PF 12/01/2013, 10/04/2014, 10/12/2015, 11/06/2016, 11/12/2017   Influenza,inj,Quad PF,6+ Mos 10/24/2010   Influenza-Unspecified 10/24/2010   Moderna SARS-COV2 Booster Vaccination 07/18/2020   Moderna Sars-Covid-2 Vaccination 02/14/2019, 03/14/2019, 12/26/2019   Pneumococcal Conjugate-13 09/28/2013   Pneumococcal Polysaccharide-23 03/05/2012   Td 08/27/2000   Tdap 08/27/2000, 10/16/2010   Zoster, Live 01/11/2013, 03/31/2020    TDAP status: Up to date  Flu Vaccine status: Up to date  Pneumococcal vaccine status: Up to date  Covid-19 vaccine status: Completed vaccines  Qualifies for Shingles Vaccine? Yes   Zostavax completed Yes   Shingrix Completed?: Yes  Screening Tests Health Maintenance  Topic Date Due   Zoster Vaccines- Shingrix (1 of 2) Never done   INFLUENZA VACCINE  09/10/2020   TETANUS/TDAP  10/15/2020   COVID-19 Vaccine (5 - Booster for Moderna series) 11/17/2020   DEXA SCAN  Completed   PNA vac Low Risk Adult  Completed   HPV VACCINES  Aged Out    Health Maintenance  Health Maintenance Due  Topic Date Due   Zoster Vaccines- Shingrix (1 of 2) Never done    Colorectal cancer screening: No longer required.   Mammogram status: No longer required due to advanced age.  Bone Density status: Completed 2013. Results reflect: Bone density results: OSTEOPENIA. Repeat every 2 years.  Lung Cancer Screening: (Low Dose CT Chest recommended if Age 87-80 years, 30 pack-year currently smoking OR have quit  w/in 15years.) does not qualify.   Lung Cancer Screening Referral: No  Additional Screening:  Hepatitis C Screening: does not qualify; Completed No  Vision Screening: Recommended annual ophthalmology exams for early detection of glaucoma and other disorders of the eye. Is the patient up to date with their annual eye exam?  No  Who is the provider or what is the name of the office in which the patient attends annual eye  exams? N/A If pt is not established with a provider, would they like to be referred to a provider to establish care? No .   Dental Screening: Recommended annual dental exams for proper oral hygiene  Community Resource Referral / Chronic Care Management: CRR required this visit?  No   CCM required this visit?  No      Plan:     I have personally reviewed and noted the following in the patient's chart:   Medical and social history Use of alcohol, tobacco or illicit drugs  Current medications and supplements including opioid prescriptions.  Functional ability and status Nutritional status Physical activity Advanced directives List of other physicians Hospitalizations, surgeries, and ER visits in previous 12 months Vitals Screenings to include cognitive, depression, and falls Referrals and appointments  In addition, I have reviewed and discussed with patient certain preventive protocols, quality metrics, and best practice recommendations. A written personalized care plan for preventive services as well as general preventive health recommendations were provided to patient.     Yvonna Alanis, NP   08/16/2020

## 2020-08-16 NOTE — Progress Notes (Signed)
Location:   [facility] Room Number: [place] of Service:  SNF ([phone]) Provider: Windell Moulding, NP  Patient Care Team: Virgie Dad, MD as PCP - General (Internal Medicine) Donato Heinz, MD as PCP - Cardiology (Cardiology) Brien Few, MD as Consulting Physician (Obstetrics and Gynecology) Marti Sleigh, MD as Consulting Physician (Gynecology) Nancy Marus, MD as Consulting Physician (Gynecologic Oncology)  Extended Emergency Contact Information Primary Emergency Contact: [facility] Address: [address]          [place], [place] [address] [name] of [place] Phone: [phone] Mobile Phone: [phone] Relation: Daughter Secondary Emergency Contact: [place] Mobile Phone: [phone] Relation: Son  Code Status: Full Code Goals of Care: Advanced Directive information Advanced Directives 08/16/2020  Does Patient Have a Medical Advance Directive? Yes  Type of Paramedic of [place];Living will  Does patient want to make changes to medical advance directive? No - Patient declined  Copy of [place] in Chart? Yes - validated most recent copy scanned in chart (See row information)  Would patient like information on creating a medical advance directive? -  Pre-existing out of facility DNR order (yellow form or pink MOST form) -     Chief Complaint  Patient presents with   Medicare Wellness    Annual Wellness Visit   Health Maintenance    Shingrix    HPI: Patient is a 87 y.o. female seen in today for an annual wellness exam.    No flowsheet data found.  Fall Risk  03/02/2014  Falls in the past year? No   No flowsheet data found.   Health Maintenance  Topic Date Due   Zoster Vaccines- Shingrix (1 of 2) Never done   INFLUENZA VACCINE  09/10/2020   TETANUS/TDAP  10/15/2020   COVID-19 Vaccine (5 - Booster for Moderna series) 11/17/2020   DEXA SCAN  Completed    PNA vac Low Risk Adult  Completed   HPV VACCINES  Aged Out    Urinary incontinence? Functional Status Survey:   Exercise? Diet? No results found. Dentition: Pain:  Past Medical History:  Diagnosis Date   Anemia associated with acute blood loss 04/03/2011   Arthritis    knees    Ascites    Blood transfusion    hx of 8299    Complication of anesthesia    Sodium drops per pt    Cystitis    DIARRHEA, ANTIBIOTIC ASSOCIATED 05/31/2009   Qualifier: Diagnosis of  By: Tommy Medal MD, Cornelius     GERD (gastroesophageal reflux disease)    H/O hiatal hernia    Hyperlipidemia    Hypertension    Hyponatremia    Neutropenia with fever ([place]) 05/18/2011   OSTEOMYELITIS, CHRONIC, LOWER LEG 04/23/2009   Qualifier: Diagnosis of  By: Johnnye Sima MD, Felecia Shelling    OSTEOPOROSIS 04/23/2009   Qualifier: Diagnosis of  By: Johnnye Sima MD, Jeffrey     Ovarian cancer ([place]) 01/25/2011   Pneumonia    hx of    Postmenopausal atrophic vaginitis    PROSTHETIC JOINT COMPLICATION 3/71/6967   Qualifier: Diagnosis of  By: Tommy Medal MD, Cornelius     Renal disorder    Decreased kidney function   Staph infection 2010   after knee replacement   Urinary frequency    Vulvitis     Past Surgical History:  Procedure Laterality Date   ABDOMINAL HYSTERECTOMY  04/01/2011   Procedure: HYSTERECTOMY ABDOMINAL;  Surgeon: Quillian Quince  Doylene Bode, MD;  Location: [facility];  Service: Gynecology;  Laterality: N/A;   APPENDECTOMY     JOINT REPLACEMENT     R knee in 2008, 5 operations on L knee   LAPAROTOMY  04/01/2011   Procedure: EXPLORATORY LAPAROTOMY;  Surgeon: Alvino Chapel, MD;  Location: [facility];  Service: Gynecology;  Laterality: N/A;   OTHER SURGICAL HISTORY     hx of C section 1966   SALPINGOOPHORECTOMY  04/01/2011   Procedure: SALPINGO OOPHERECTOMY;  Surgeon: Alvino Chapel, MD;  Location: [facility];  Service: Gynecology;  Laterality: Bilateral;   TONSILLECTOMY      The patient has a family  history of  shoulder Social History   Socioeconomic History   Marital status: Widowed    Spouse name: Not on file   Number of children: Not on file   Years of education: Not on file   Highest education level: Not on file  Occupational History   Not on file  Tobacco Use   Smoking status: Former    Pack years: 0.00    Types: Cigarettes    Quit date: 02/11/1952    Years since quitting: 68.5   Smokeless tobacco: Never  Vaping Use   Vaping Use: Never used  Substance and Sexual Activity   Alcohol use: Not Currently   Drug use: Never   Sexual activity: Never  Other Topics Concern   Not on file  Social History Narrative   Not on file   Social Determinants of Health   Financial Resource Strain: Not on file  Food Insecurity: Not on file  Transportation Needs: Not on file  Physical Activity: Not on file  Stress: Not on file  Social Connections: Not on file  Intimate Partner Violence: Not on file    Allergies  Allergen Reactions   Morphine And Related Anaphylaxis and Other (See Comments)    Given after knee replacement and code blue occurred   Penicillins Hives, Itching and Other (See Comments)    Syncope, also   Nsaids Other (See Comments)    Because of an abnormal kidney test result   Shellfish Allergy Nausea And Vomiting and Other (See Comments)    Pt has shellfish allergy only.  Has had IV contrast x 2 and did fine.    Allergies as of 08/16/2020       Reactions   Morphine And Related Anaphylaxis, Other (See Comments)   Given after knee replacement and code blue occurred   Penicillins Hives, Itching, Other (See Comments)   Syncope, also   Nsaids Other (See Comments)   Because of an abnormal kidney test result   Shellfish Allergy Nausea And Vomiting, Other (See Comments)   Pt has shellfish allergy only.  Has had IV contrast x 2 and did fine.        Medication List        Accurate as of August 16, 2020 11:16 AM. If you have any questions, ask your nurse or  doctor.          acetaminophen 500 MG tablet Commonly known as: TYLENOL Take 2 tablets (1,000 mg total) by mouth 3 (three) times daily.   azelastine 0.1 % nasal spray Commonly known as: ASTELIN Place 1 spray into both nostrils daily as needed for rhinitis or allergies.   B-COMPLEX/FOLIC ACID/VITAMIN C PO Take 1 tablet by mouth daily with breakfast.   calcium carbonate 750 MG chewable tablet Commonly known as: TUMS EX Chew 750 mg by mouth daily  as needed for heartburn.   cycloSPORINE 0.05 % ophthalmic emulsion Commonly known as: RESTASIS Place 1 drop into both eyes 2 (two) times daily.   diclofenac Sodium 1 % Gel Commonly known as: VOLTAREN Apply 2 g topically 4 (four) times daily.   docusate sodium 100 MG capsule Commonly known as: COLACE Take 100 mg by mouth daily as needed for mild constipation.   Ensure Take 237 mLs by mouth. 263mLS., oral, Twice A Day, Give Ensure 237MLs/Boost 237MLS Strawberry Liquid oral per family request W/Breakfast and 2PM to increase Nutritional adequacy. Doc.% consumed. Family will provide.   hydrALAZINE 10 MG tablet Commonly known as: APRESOLINE Take 10 mg by mouth 2 (two) times daily as needed. PRN only if SBP more then 170 /90   meclizine 12.5 MG tablet Commonly known as: ANTIVERT Take 1 tablet (12.5 mg total) by mouth 2 (two) times daily as needed for dizziness.   melatonin 3 MG Tabs tablet Take 1 tablet (3 mg total) by mouth at bedtime.   ofloxacin 0.3 % ophthalmic solution Commonly known as: OCUFLOX Place 1 drop into the left eye in the morning and at bedtime.   pantoprazole 40 MG tablet Commonly known as: PROTONIX Take 1 tablet (40 mg total) by mouth 2 (two) times daily.   sodium chloride 1 g tablet Take 1 g by mouth 2 (two) times daily. What changed: Another medication with the same name was removed. Continue taking this medication, and follow the directions you see here. Changed by: Yvonna Alanis, NP   Vitamin D3 50 MCG  (2000 UT) Tabs Take 2,000 Units by mouth in the morning.   zinc oxide 20 % ointment Apply 1 application topically as needed (for redness- to be applied to buttocks/peri area and after every incontinent episode).         Review of Systems:  Review of Systems  Physical Exam: Vitals:   08/16/20 1019  BP: (!) 146/72  Pulse: 81  Resp: 14  Temp: (!) 97.5 F (36.4 C)  SpO2: 97%  Weight: 168 lb (76.2 kg)  Height: 5\' 3"  (1.6 m)   Body mass index is 29.76 kg/m. Physical Exam  Labs reviewed: Basic Metabolic Panel: Recent Labs    10/07/19 0613 10/08/19 0627 04/06/20 1234 04/07/20 0220 04/22/20 0109 04/23/20 4098 04/24/20 0058 04/28/20 1641 04/29/20 0521 04/30/20 0340 05/03/20 0000 05/10/20 0000 05/14/20 0000 06/11/20 0000  NA 129*   < >  --    < > 126* 128* 127* 128* 127* 130*   < > 133* 132* 135*  K 3.9   < >  --    < > 4.1 4.2 4.3 5.2* 4.5 4.6   < > 4.4 4.6 4.5  CL 104   < >  --    < > 98 96* 97* 97* 98 100   < > 100 99 99  CO2 16*   < >  --    < > 22 25 25  20* 22 24   < > 23* 24* 28*  GLUCOSE 129*   < >  --    < > 111* 142* 109* 160* 119* 97  --   --   --   --   BUN 26*   < >  --    < > 16 17 23  30* 33* 29*   < > 23* 33* 21  CREATININE 0.91   < >  --    < > 0.83 0.97 0.97 1.16* 1.37* 1.06*   < >  0.8 1.0 0.9  CALCIUM 8.2*   < >  --    < > 8.4* 8.6* 8.3* 9.0 8.9 8.7*   < > 9.0 9.2 9.6  MG  --    < >  --    < > 1.6* 1.6* 1.6*  --   --   --   --   --   --   --   PHOS  --    < >  --    < > 2.8 2.6 3.1  --   --  2.8  --   --   --   --   TSH 1.947  --  2.357  --   --   --   --   --   --   --   --   --   --   --    < > = values in this interval not displayed.   Liver Function Tests: Recent Labs    04/22/20 0109 04/23/20 0652 04/24/20 0058 04/30/20 0340 06/11/20 0000  AST 22 33 23  --  26  ALT 16 25 20   --  14  ALKPHOS 75 103 77  --  86  BILITOT 1.0 0.9 0.7  --   --   PROT 6.5 6.9 5.8*  --   --   ALBUMIN 2.5* 2.5* 2.2* 2.4* 3.4*   Recent Labs     03/02/20 1053  LIPASE 40   No results for input(s): AMMONIA in the last 8760 hours. CBC: Recent Labs    04/22/20 0109 04/23/20 0652 04/24/20 0058 04/28/20 1641 04/29/20 0521  WBC 8.4 6.7 7.1 12.2* 10.2  NEUTROABS 5.6 4.4 4.2  --   --   HGB 10.8* 11.9* 10.3* 14.6 12.0  HCT 32.5* 34.9* 31.0* 43.5 34.9*  MCV 84.9 83.9 84.9 85.1 83.5  PLT 322 336 332 441* 348   Lipid Panel: No results for input(s): CHOL, HDL, LDLCALC, TRIG, CHOLHDL, LDLDIRECT in the last 8760 hours. No results found for: HGBA1C  Procedures: No results found.  Assessment/Plan There are no diagnoses linked to this encounter.   Labs/tests ordered:  * No order type specified * Next appt:  @NEXTENCTHIS  DEPT@

## 2020-08-16 NOTE — Patient Instructions (Signed)
  Ms. Canal , Thank you for taking time to come for your Medicare Wellness Visit. I appreciate your ongoing commitment to your health goals. Please review the following plan we discussed and let me know if I can assist you in the future.   These are the goals we discussed:  Goals      DIET - INCREASE WATER INTAKE        This is a list of the screening recommended for you and due dates:  Health Maintenance  Topic Date Due   Zoster (Shingles) Vaccine (1 of 2) Never done   Flu Shot  09/10/2020   Tetanus Vaccine  10/15/2020   COVID-19 Vaccine (5 - Booster for Moderna series) 11/17/2020   DEXA scan (bone density measurement)  Completed   Pneumonia vaccines  Completed   HPV Vaccine  Aged Out

## 2020-08-17 DIAGNOSIS — M6281 Muscle weakness (generalized): Secondary | ICD-10-CM | POA: Diagnosis not present

## 2020-08-17 DIAGNOSIS — R55 Syncope and collapse: Secondary | ICD-10-CM | POA: Diagnosis not present

## 2020-08-17 DIAGNOSIS — I951 Orthostatic hypotension: Secondary | ICD-10-CM | POA: Diagnosis not present

## 2020-08-17 DIAGNOSIS — R2689 Other abnormalities of gait and mobility: Secondary | ICD-10-CM | POA: Diagnosis not present

## 2020-08-17 DIAGNOSIS — R2681 Unsteadiness on feet: Secondary | ICD-10-CM | POA: Diagnosis not present

## 2020-08-17 DIAGNOSIS — I10 Essential (primary) hypertension: Secondary | ICD-10-CM | POA: Diagnosis not present

## 2020-08-20 DIAGNOSIS — R2689 Other abnormalities of gait and mobility: Secondary | ICD-10-CM | POA: Diagnosis not present

## 2020-08-20 DIAGNOSIS — R2681 Unsteadiness on feet: Secondary | ICD-10-CM | POA: Diagnosis not present

## 2020-08-20 DIAGNOSIS — R55 Syncope and collapse: Secondary | ICD-10-CM | POA: Diagnosis not present

## 2020-08-20 DIAGNOSIS — I951 Orthostatic hypotension: Secondary | ICD-10-CM | POA: Diagnosis not present

## 2020-08-20 DIAGNOSIS — M6281 Muscle weakness (generalized): Secondary | ICD-10-CM | POA: Diagnosis not present

## 2020-08-20 DIAGNOSIS — I10 Essential (primary) hypertension: Secondary | ICD-10-CM | POA: Diagnosis not present

## 2020-08-21 DIAGNOSIS — R55 Syncope and collapse: Secondary | ICD-10-CM | POA: Diagnosis not present

## 2020-08-21 DIAGNOSIS — R2681 Unsteadiness on feet: Secondary | ICD-10-CM | POA: Diagnosis not present

## 2020-08-21 DIAGNOSIS — I951 Orthostatic hypotension: Secondary | ICD-10-CM | POA: Diagnosis not present

## 2020-08-21 DIAGNOSIS — R2689 Other abnormalities of gait and mobility: Secondary | ICD-10-CM | POA: Diagnosis not present

## 2020-08-21 DIAGNOSIS — I10 Essential (primary) hypertension: Secondary | ICD-10-CM | POA: Diagnosis not present

## 2020-08-21 DIAGNOSIS — M6281 Muscle weakness (generalized): Secondary | ICD-10-CM | POA: Diagnosis not present

## 2020-08-23 DIAGNOSIS — I10 Essential (primary) hypertension: Secondary | ICD-10-CM | POA: Diagnosis not present

## 2020-08-24 DIAGNOSIS — R2681 Unsteadiness on feet: Secondary | ICD-10-CM | POA: Diagnosis not present

## 2020-08-24 DIAGNOSIS — R55 Syncope and collapse: Secondary | ICD-10-CM | POA: Diagnosis not present

## 2020-08-24 DIAGNOSIS — M6281 Muscle weakness (generalized): Secondary | ICD-10-CM | POA: Diagnosis not present

## 2020-08-24 DIAGNOSIS — I951 Orthostatic hypotension: Secondary | ICD-10-CM | POA: Diagnosis not present

## 2020-08-24 DIAGNOSIS — R2689 Other abnormalities of gait and mobility: Secondary | ICD-10-CM | POA: Diagnosis not present

## 2020-08-24 DIAGNOSIS — I10 Essential (primary) hypertension: Secondary | ICD-10-CM | POA: Diagnosis not present

## 2020-08-27 DIAGNOSIS — R2681 Unsteadiness on feet: Secondary | ICD-10-CM | POA: Diagnosis not present

## 2020-08-27 DIAGNOSIS — R2689 Other abnormalities of gait and mobility: Secondary | ICD-10-CM | POA: Diagnosis not present

## 2020-08-27 DIAGNOSIS — I10 Essential (primary) hypertension: Secondary | ICD-10-CM | POA: Diagnosis not present

## 2020-08-27 DIAGNOSIS — R55 Syncope and collapse: Secondary | ICD-10-CM | POA: Diagnosis not present

## 2020-08-27 DIAGNOSIS — I951 Orthostatic hypotension: Secondary | ICD-10-CM | POA: Diagnosis not present

## 2020-08-27 DIAGNOSIS — M6281 Muscle weakness (generalized): Secondary | ICD-10-CM | POA: Diagnosis not present

## 2020-08-29 DIAGNOSIS — I10 Essential (primary) hypertension: Secondary | ICD-10-CM | POA: Diagnosis not present

## 2020-08-29 DIAGNOSIS — I951 Orthostatic hypotension: Secondary | ICD-10-CM | POA: Diagnosis not present

## 2020-08-29 DIAGNOSIS — M6281 Muscle weakness (generalized): Secondary | ICD-10-CM | POA: Diagnosis not present

## 2020-08-29 DIAGNOSIS — R55 Syncope and collapse: Secondary | ICD-10-CM | POA: Diagnosis not present

## 2020-08-29 DIAGNOSIS — R2681 Unsteadiness on feet: Secondary | ICD-10-CM | POA: Diagnosis not present

## 2020-08-29 DIAGNOSIS — R2689 Other abnormalities of gait and mobility: Secondary | ICD-10-CM | POA: Diagnosis not present

## 2020-08-31 DIAGNOSIS — I951 Orthostatic hypotension: Secondary | ICD-10-CM | POA: Diagnosis not present

## 2020-08-31 DIAGNOSIS — I10 Essential (primary) hypertension: Secondary | ICD-10-CM | POA: Diagnosis not present

## 2020-08-31 DIAGNOSIS — R55 Syncope and collapse: Secondary | ICD-10-CM | POA: Diagnosis not present

## 2020-08-31 DIAGNOSIS — R2689 Other abnormalities of gait and mobility: Secondary | ICD-10-CM | POA: Diagnosis not present

## 2020-08-31 DIAGNOSIS — M6281 Muscle weakness (generalized): Secondary | ICD-10-CM | POA: Diagnosis not present

## 2020-08-31 DIAGNOSIS — R2681 Unsteadiness on feet: Secondary | ICD-10-CM | POA: Diagnosis not present

## 2020-09-03 ENCOUNTER — Non-Acute Institutional Stay (SKILLED_NURSING_FACILITY): Payer: Medicare Other | Admitting: Orthopedic Surgery

## 2020-09-03 ENCOUNTER — Encounter: Payer: Self-pay | Admitting: Orthopedic Surgery

## 2020-09-03 DIAGNOSIS — K219 Gastro-esophageal reflux disease without esophagitis: Secondary | ICD-10-CM

## 2020-09-03 DIAGNOSIS — I10 Essential (primary) hypertension: Secondary | ICD-10-CM

## 2020-09-03 DIAGNOSIS — K5901 Slow transit constipation: Secondary | ICD-10-CM | POA: Diagnosis not present

## 2020-09-03 DIAGNOSIS — H1032 Unspecified acute conjunctivitis, left eye: Secondary | ICD-10-CM | POA: Diagnosis not present

## 2020-09-03 DIAGNOSIS — M25571 Pain in right ankle and joints of right foot: Secondary | ICD-10-CM | POA: Diagnosis not present

## 2020-09-03 DIAGNOSIS — N1831 Chronic kidney disease, stage 3a: Secondary | ICD-10-CM

## 2020-09-03 DIAGNOSIS — R55 Syncope and collapse: Secondary | ICD-10-CM | POA: Diagnosis not present

## 2020-09-03 DIAGNOSIS — R2681 Unsteadiness on feet: Secondary | ICD-10-CM | POA: Diagnosis not present

## 2020-09-03 DIAGNOSIS — R2689 Other abnormalities of gait and mobility: Secondary | ICD-10-CM | POA: Diagnosis not present

## 2020-09-03 DIAGNOSIS — F5101 Primary insomnia: Secondary | ICD-10-CM

## 2020-09-03 DIAGNOSIS — G8929 Other chronic pain: Secondary | ICD-10-CM

## 2020-09-03 DIAGNOSIS — E871 Hypo-osmolality and hyponatremia: Secondary | ICD-10-CM

## 2020-09-03 DIAGNOSIS — R635 Abnormal weight gain: Secondary | ICD-10-CM | POA: Diagnosis not present

## 2020-09-03 DIAGNOSIS — M6281 Muscle weakness (generalized): Secondary | ICD-10-CM | POA: Diagnosis not present

## 2020-09-03 DIAGNOSIS — R4189 Other symptoms and signs involving cognitive functions and awareness: Secondary | ICD-10-CM

## 2020-09-03 DIAGNOSIS — I951 Orthostatic hypotension: Secondary | ICD-10-CM | POA: Diagnosis not present

## 2020-09-03 NOTE — Progress Notes (Signed)
Location:   [facility] Room Number: N04 Place of Service:  SNF ([phone]) Provider:  Windell Moulding, NP    Patient Care Team: Virgie Dad, MD as PCP - General (Internal Medicine) Donato Heinz, MD as PCP - Cardiology (Cardiology) Brien Few, MD as Consulting Physician (Obstetrics and Gynecology) Marti Sleigh, MD as Consulting Physician (Gynecology) Nancy Marus, MD as Consulting Physician (Gynecologic Oncology)  Extended Emergency Contact Information Primary Emergency Contact: [facility] Address: [address]          [place], [place] [address] [name] of [place] Phone: [phone] Mobile Phone: [phone] Relation: Daughter Secondary Emergency Contact: [place] Mobile Phone: [phone] Relation: Son  Code Status:  FULL CODE Goals of care: Advanced Directive information Advanced Directives 09/03/2020  Does Patient Have a Medical Advance Directive? Yes  Type of Paramedic of [place];Living will  Does patient want to make changes to medical advance directive? No - Patient declined  Copy of [place] in Chart? Yes - validated most recent copy scanned in chart (See row information)  Would patient like information on creating a medical advance directive? -  Pre-existing out of facility DNR order (yellow form or pink MOST form) -     Chief Complaint  Patient presents with   Medical Management of Chronic Issues    Routine follow up.    Health Maintenance    Discuss need for shingles vaccine    HPI:  Pt is a 87 y.o. female seen today for medical management of chronic diseases.    She currently resides on the skilled nursing unit at [facility]. Past medical history includes: HTN, GERD, constipation, ovarian cancer, OA, hyponatremia, HLD, and gait abnormality.   Hyponatremia- Na 137 08/23/2020, remains of sodium tablets bid HTN- BUN/creat 31/.99 08/23/2020,  remains off antihypertensives since hospitalization 04/30/2020, she denies chest pain/sob/headaches or dizziness Left eye conjunctivitis- resolved with eye drops Dizziness- denies dizziness today, no recent falls GERD- hgb 10.3 04/24/2020, protonix 40 mg bid, tums prn Cognitive impairment- CT head 01/21 noted chronic microvascular ischemic changes Insomnia- melatonin 3 mg daily Constipation- LBM 07/22, colace 100 mg daily prn Right ankle pain- intermittent pain since March 2022, 03/09 xray right foot revealed subtle nondisplaced fractures and mild spurring of dorsal midfoot, inferior calcaneal spur, ambulating more with PWC, reports less pain today, tylenol 1000 mg tid Weight gain, gained three pounds since last month, appetite healthy, does not snack much, family will take her out to eat at times.   No recent falls, injuries or behavioral outbursts.   Recent blood pressures:  07/25- 152/75  07/24- 171/87, 141/76  07/23- 134/87, 146/79  Recent weights:  07/04- 168 lbs  06/01- 165 lbs  05/03- 165.4 lbs  04/06- 160 lbs  She plans to get new hearing aids soon. She states" I am pleased with care here, but my belongings are missing."   Nurse does not report any other concerns, vitals stable.       Past Medical History:  Diagnosis Date   Anemia associated with acute blood loss 04/03/2011   Arthritis    knees    Ascites    Blood transfusion    hx of [id number]    Complication of anesthesia    Sodium drops per pt    Cystitis    DIARRHEA, ANTIBIOTIC ASSOCIATED 05/31/2009   Qualifier: Diagnosis of  By: Tommy Medal MD, Cornelius     GERD (gastroesophageal reflux disease)    H/O  hiatal hernia    Hyperlipidemia    Hypertension    Hyponatremia    Neutropenia with fever ([place]) 05/18/2011   OSTEOMYELITIS, CHRONIC, LOWER LEG 04/23/2009   Qualifier: Diagnosis of  By: Johnnye Sima MD, Felecia Shelling    OSTEOPOROSIS 04/23/2009   Qualifier: Diagnosis of  By: Johnnye Sima MD, Dellis Filbert     Ovarian cancer  ([place]) 01/25/2011   Pneumonia    hx of    Postmenopausal atrophic vaginitis    PROSTHETIC JOINT COMPLICATION [id number]   Qualifier: Diagnosis of  By: Tommy Medal MD, Cornelius     Renal disorder    Decreased kidney function   Staph infection 2010   after knee replacement   Urinary frequency    Vulvitis    Past Surgical History:  Procedure Laterality Date   ABDOMINAL HYSTERECTOMY  04/01/2011   Procedure: HYSTERECTOMY ABDOMINAL;  Surgeon: Alvino Chapel, MD;  Location: [facility] ORS;  Service: Gynecology;  Laterality: N/A;   APPENDECTOMY     JOINT REPLACEMENT     R knee in 2008, 5 operations on L knee   LAPAROTOMY  04/01/2011   Procedure: EXPLORATORY LAPAROTOMY;  Surgeon: Alvino Chapel, MD;  Location: [facility];  Service: Gynecology;  Laterality: N/A;   OTHER SURGICAL HISTORY     hx of C section 1966   SALPINGOOPHORECTOMY  04/01/2011   Procedure: SALPINGO OOPHERECTOMY;  Surgeon: Alvino Chapel, MD;  Location: [facility];  Service: Gynecology;  Laterality: Bilateral;   TONSILLECTOMY      Allergies  Allergen Reactions   Morphine And Related Anaphylaxis and Other (See Comments)    Given after knee replacement and code blue occurred   Penicillins Hives, Itching and Other (See Comments)    Syncope, also   Nsaids Other (See Comments)    Because of an abnormal kidney test result   Shellfish Allergy Nausea And Vomiting and Other (See Comments)    Pt has shellfish allergy only.  Has had IV contrast x 2 and did fine.    Allergies as of 09/03/2020       Reactions   Morphine And Related Anaphylaxis, Other (See Comments)   Given after knee replacement and code blue occurred   Penicillins Hives, Itching, Other (See Comments)   Syncope, also   Nsaids Other (See Comments)   Because of an abnormal kidney test result   Shellfish Allergy Nausea And Vomiting, Other (See Comments)   Pt has shellfish allergy only.  Has had IV contrast x 2 and did fine.        Medication List         Accurate as of September 03, 2020  9:52 AM. If you have any questions, ask your nurse or doctor.          acetaminophen 500 MG tablet Commonly known as: TYLENOL Take 2 tablets (1,000 mg total) by mouth 3 (three) times daily.   azelastine 0.1 % nasal spray Commonly known as: ASTELIN Place 1 spray into both nostrils daily as needed for rhinitis or allergies.   B-COMPLEX/FOLIC ACID/VITAMIN C PO Take 1 tablet by mouth daily with breakfast.   calcium carbonate 750 MG chewable tablet Commonly known as: TUMS EX Chew 750 mg by mouth daily as needed for heartburn.   cycloSPORINE 0.05 % ophthalmic emulsion Commonly known as: RESTASIS Place 1 drop into both eyes 2 (two) times daily.   diclofenac Sodium 1 % Gel Commonly known as: VOLTAREN Apply 2 g topically 4 (four) times daily.  docusate sodium 100 MG capsule Commonly known as: COLACE Take 100 mg by mouth daily as needed for mild constipation.   Ensure Take 237 mLs by mouth. 268mS., oral, Twice A Day, Give Ensure 237MLs/Boost 237MLS Strawberry Liquid oral per family request W/Breakfast and 2PM to increase Nutritional adequacy. Doc.% consumed. Family will provide.   hydrALAZINE 10 MG tablet Commonly known as: APRESOLINE Take 10 mg by mouth 2 (two) times daily as needed. PRN only if SBP more then 170 /90   meclizine 12.5 MG tablet Commonly known as: ANTIVERT Take 1 tablet (12.5 mg total) by mouth 2 (two) times daily as needed for dizziness.   melatonin 3 MG Tabs tablet Take 1 tablet (3 mg total) by mouth at bedtime.   pantoprazole 40 MG tablet Commonly known as: PROTONIX Take 1 tablet (40 mg total) by mouth 2 (two) times daily.   sodium chloride 1 g tablet Take 1 g by mouth 2 (two) times daily.   Vitamin D3 50 MCG (2000 UT) Tabs Take 2,000 Units by mouth in the morning.   zinc oxide 20 % ointment Apply 1 application topically as needed (for redness- to be applied to buttocks/peri area and after every incontinent  episode).        Review of Systems  Constitutional:  Negative for activity change, appetite change, fatigue and fever.  HENT:  Positive for hearing loss. Negative for congestion and trouble swallowing.   Eyes:  Negative for discharge and itching.  Respiratory:  Negative for cough, shortness of breath and wheezing.   Cardiovascular:  Negative for chest pain and leg swelling.  Gastrointestinal:  Positive for constipation. Negative for abdominal distention, abdominal pain, diarrhea and nausea.  Genitourinary:  Positive for frequency. Negative for dysuria and hematuria.  Musculoskeletal:  Positive for arthralgias, gait problem and myalgias.  Skin: Negative.   Neurological:  Positive for weakness. Negative for dizziness and light-headedness.  Psychiatric/Behavioral:  Positive for confusion and sleep disturbance. Negative for dysphoric mood. The patient is not nervous/anxious.    Immunization History  Administered Date(s) Administered   Influenza Split 11/13/2012, 12/13/2012, 11/30/2013, 10/09/2014, 11/24/2018   Influenza, High Dose Seasonal PF 12/01/2013, 10/04/2014, 10/12/2015, 11/06/2016, 11/12/2017   Influenza,inj,Quad PF,6+ Mos 10/24/2010   Influenza-Unspecified 10/24/2010   Moderna SARS-COV2 Booster Vaccination 07/18/2020   Moderna Sars-Covid-2 Vaccination 02/14/2019, 03/14/2019, 12/26/2019   Pneumococcal Conjugate-13 09/28/2013   Pneumococcal Polysaccharide-23 03/05/2012   Td 08/27/2000   Tdap 08/27/2000, 10/16/2010   Zoster, Live 01/11/2013, 03/31/2020   Pertinent  Health Maintenance Due  Topic Date Due   INFLUENZA VACCINE  09/10/2020   DEXA SCAN  Completed   PNA vac Low Risk Adult  Completed   Fall Risk  08/16/2020 03/02/2014  Falls in the past year? 1 No  Number falls in past yr: 1 -  Injury with Fall? 0 -  Risk for fall due to : History of fall(s);Impaired balance/gait;Impaired mobility;No Fall Risks -  Follow up Falls evaluation completed;Education provided;Falls  prevention discussed -   Functional Status Survey:    Vitals:   09/03/20 0944  BP: (!) 171/87  Pulse: 80  Resp: 16  Temp: 97.8 F (36.6 C)  SpO2: 99%  Weight: 168 lb (76.2 kg)  Height: '5\' 3"'$  (1.6 m)   Body mass index is 29.76 kg/m. Physical Exam Vitals reviewed.  Constitutional:      General: She is not in acute distress. HENT:     Head: Normocephalic.     Right Ear: There is no impacted cerumen.  Left Ear: There is no impacted cerumen.     Nose: Nose normal.     Mouth/Throat:     Mouth: Mucous membranes are moist.  Eyes:     General:        Right eye: No discharge.        Left eye: No discharge.  Neck:     Vascular: No carotid bruit.  Cardiovascular:     Rate and Rhythm: Normal rate and regular rhythm.     Pulses: Normal pulses.     Heart sounds: Normal heart sounds. No murmur heard. Pulmonary:     Effort: Pulmonary effort is normal. No respiratory distress.     Breath sounds: Normal breath sounds. No wheezing.  Abdominal:     General: Bowel sounds are normal. There is no distension.     Palpations: Abdomen is soft.     Tenderness: There is no abdominal tenderness.  Musculoskeletal:     Cervical back: Normal range of motion.     Right lower leg: Edema present.     Left lower leg: Edema present.     Comments: Non-pitting  Lymphadenopathy:     Cervical: No cervical adenopathy.  Skin:    General: Skin is warm and dry.     Capillary Refill: Capillary refill takes less than 2 seconds.  Neurological:     General: No focal deficit present.     Mental Status: She is alert and oriented to person, place, and time.     Motor: Weakness present.     Gait: Gait abnormal.     Comments: PWC  Psychiatric:        Mood and Affect: Mood normal.        Behavior: Behavior normal.    Labs reviewed: Recent Labs    04/22/20 0109 04/23/20 0652 04/24/20 0058 04/28/20 1641 04/29/20 0521 04/30/20 0340 05/03/20 0000 05/10/20 0000 05/14/20 0000 06/11/20 0000  NA  126* 128* 127* 128* 127* 130*   < > 133* 132* 135*  K 4.1 4.2 4.3 5.2* 4.5 4.6   < > 4.4 4.6 4.5  CL 98 96* 97* 97* 98 100   < > 100 99 99  CO2 '22 25 25 '$ 20* 22 24   < > 23* 24* 28*  GLUCOSE 111* 142* 109* 160* 119* 97  --   --   --   --   BUN '16 17 23 '$ 30* 33* 29*   < > 23* 33* 21  CREATININE 0.83 0.97 0.97 1.16* 1.37* 1.06*   < > 0.8 1.0 0.9  CALCIUM 8.4* 8.6* 8.3* 9.0 8.9 8.7*   < > 9.0 9.2 9.6  MG 1.6* 1.6* 1.6*  --   --   --   --   --   --   --   PHOS 2.8 2.6 3.1  --   --  2.8  --   --   --   --    < > = values in this interval not displayed.   Recent Labs    04/22/20 0109 04/23/20 0652 04/24/20 0058 04/30/20 0340 06/11/20 0000  AST 22 33 23  --  26  ALT '16 25 20  '$ --  14  ALKPHOS 75 103 77  --  86  BILITOT 1.0 0.9 0.7  --   --   PROT 6.5 6.9 5.8*  --   --   ALBUMIN 2.5* 2.5* 2.2* 2.4* 3.4*   Recent Labs    04/22/20 0109 04/23/20 [id number] 04/24/20  [id number] 04/28/20 1641 04/29/20 0521  WBC 8.4 6.7 7.1 12.2* 10.2  NEUTROABS 5.6 4.4 4.2  --   --   HGB 10.8* 11.9* 10.3* 14.6 12.0  HCT 32.5* 34.9* 31.0* 43.5 34.9*  MCV 84.9 83.9 84.9 85.1 83.5  PLT 322 336 332 441* 348   Lab Results  Component Value Date   TSH 2.357 04/06/2020   No results found for: HGBA1C No results found for: CHOL, HDL, LDLCALC, LDLDIRECT, TRIG, CHOLHDL  Significant Diagnostic Results in last 30 days:  No results found.  Assessment/Plan 1. Hyponatremia - Na 137 08/23/2020 - cont sodium tablets 1 g bid  2. Essential hypertension - controlled - she had been off antihypertensives since 04/2020 due to hypotension and syncope  3. Acute bacterial conjunctivitis of left eye - resolved   4. Gastroesophageal reflux disease, unspecified whether esophagitis present - hgb 10.3 04/24/2020 - cont Protonix 40 mg bid - cont prn Tums  5. Cognitive impairment - CT head 03/02/20 noted chronic microvascular changes - no recent behavioral outbursts  6. Primary insomnia - stable with melatonin 3 mg  daily  7. Chronic pain of right ankle - ongoing, less pain since switching to PWC - cont tylenol 1000 mg tid  8. Slow transit constipation - LBM 07/22 - cont colace 100 mg daily prn - encourage hydration with water  9. Weight gain - gained 3 lbs in last month - suspect due to decreased mobility d/t PWC - consider checking TSH if weight continues to increase  10. Stage 3a chronic kidney disease ([place]) - BUN/creat 31/0.99 08/23/2020 - continue to avoid nephrotoxic drugs like NSAIDS and dose adjust medications to be renally excreted    Family/ staff Communication: plan discussed with patient and nurse  Labs/tests ordered:  none

## 2020-09-05 DIAGNOSIS — R2689 Other abnormalities of gait and mobility: Secondary | ICD-10-CM | POA: Diagnosis not present

## 2020-09-05 DIAGNOSIS — I10 Essential (primary) hypertension: Secondary | ICD-10-CM | POA: Diagnosis not present

## 2020-09-05 DIAGNOSIS — M6281 Muscle weakness (generalized): Secondary | ICD-10-CM | POA: Diagnosis not present

## 2020-09-05 DIAGNOSIS — R55 Syncope and collapse: Secondary | ICD-10-CM | POA: Diagnosis not present

## 2020-09-05 DIAGNOSIS — I951 Orthostatic hypotension: Secondary | ICD-10-CM | POA: Diagnosis not present

## 2020-09-05 DIAGNOSIS — R2681 Unsteadiness on feet: Secondary | ICD-10-CM | POA: Diagnosis not present

## 2020-09-07 DIAGNOSIS — I951 Orthostatic hypotension: Secondary | ICD-10-CM | POA: Diagnosis not present

## 2020-09-07 DIAGNOSIS — R2689 Other abnormalities of gait and mobility: Secondary | ICD-10-CM | POA: Diagnosis not present

## 2020-09-07 DIAGNOSIS — R2681 Unsteadiness on feet: Secondary | ICD-10-CM | POA: Diagnosis not present

## 2020-09-07 DIAGNOSIS — I10 Essential (primary) hypertension: Secondary | ICD-10-CM | POA: Diagnosis not present

## 2020-09-07 DIAGNOSIS — R55 Syncope and collapse: Secondary | ICD-10-CM | POA: Diagnosis not present

## 2020-09-07 DIAGNOSIS — M6281 Muscle weakness (generalized): Secondary | ICD-10-CM | POA: Diagnosis not present

## 2020-09-10 DIAGNOSIS — I10 Essential (primary) hypertension: Secondary | ICD-10-CM | POA: Diagnosis not present

## 2020-09-10 DIAGNOSIS — M25571 Pain in right ankle and joints of right foot: Secondary | ICD-10-CM | POA: Diagnosis not present

## 2020-09-10 DIAGNOSIS — N183 Chronic kidney disease, stage 3 unspecified: Secondary | ICD-10-CM | POA: Diagnosis not present

## 2020-09-10 DIAGNOSIS — I951 Orthostatic hypotension: Secondary | ICD-10-CM | POA: Diagnosis not present

## 2020-09-10 DIAGNOSIS — D62 Acute posthemorrhagic anemia: Secondary | ICD-10-CM | POA: Diagnosis not present

## 2020-09-10 DIAGNOSIS — R55 Syncope and collapse: Secondary | ICD-10-CM | POA: Diagnosis not present

## 2020-09-10 DIAGNOSIS — E871 Hypo-osmolality and hyponatremia: Secondary | ICD-10-CM | POA: Diagnosis not present

## 2020-09-10 DIAGNOSIS — C569 Malignant neoplasm of unspecified ovary: Secondary | ICD-10-CM | POA: Diagnosis not present

## 2020-09-12 DIAGNOSIS — R55 Syncope and collapse: Secondary | ICD-10-CM | POA: Diagnosis not present

## 2020-09-12 DIAGNOSIS — I10 Essential (primary) hypertension: Secondary | ICD-10-CM | POA: Diagnosis not present

## 2020-09-12 DIAGNOSIS — C569 Malignant neoplasm of unspecified ovary: Secondary | ICD-10-CM | POA: Diagnosis not present

## 2020-09-12 DIAGNOSIS — N183 Chronic kidney disease, stage 3 unspecified: Secondary | ICD-10-CM | POA: Diagnosis not present

## 2020-09-12 DIAGNOSIS — E871 Hypo-osmolality and hyponatremia: Secondary | ICD-10-CM | POA: Diagnosis not present

## 2020-09-12 DIAGNOSIS — D62 Acute posthemorrhagic anemia: Secondary | ICD-10-CM | POA: Diagnosis not present

## 2020-09-13 DIAGNOSIS — L821 Other seborrheic keratosis: Secondary | ICD-10-CM | POA: Diagnosis not present

## 2020-09-13 DIAGNOSIS — L814 Other melanin hyperpigmentation: Secondary | ICD-10-CM | POA: Diagnosis not present

## 2020-09-13 DIAGNOSIS — L820 Inflamed seborrheic keratosis: Secondary | ICD-10-CM | POA: Diagnosis not present

## 2020-09-13 DIAGNOSIS — L570 Actinic keratosis: Secondary | ICD-10-CM | POA: Diagnosis not present

## 2020-09-14 DIAGNOSIS — N183 Chronic kidney disease, stage 3 unspecified: Secondary | ICD-10-CM | POA: Diagnosis not present

## 2020-09-14 DIAGNOSIS — R55 Syncope and collapse: Secondary | ICD-10-CM | POA: Diagnosis not present

## 2020-09-14 DIAGNOSIS — E871 Hypo-osmolality and hyponatremia: Secondary | ICD-10-CM | POA: Diagnosis not present

## 2020-09-14 DIAGNOSIS — D62 Acute posthemorrhagic anemia: Secondary | ICD-10-CM | POA: Diagnosis not present

## 2020-09-14 DIAGNOSIS — I10 Essential (primary) hypertension: Secondary | ICD-10-CM | POA: Diagnosis not present

## 2020-09-14 DIAGNOSIS — C569 Malignant neoplasm of unspecified ovary: Secondary | ICD-10-CM | POA: Diagnosis not present

## 2020-09-17 DIAGNOSIS — I10 Essential (primary) hypertension: Secondary | ICD-10-CM | POA: Diagnosis not present

## 2020-09-17 DIAGNOSIS — R55 Syncope and collapse: Secondary | ICD-10-CM | POA: Diagnosis not present

## 2020-09-17 DIAGNOSIS — C569 Malignant neoplasm of unspecified ovary: Secondary | ICD-10-CM | POA: Diagnosis not present

## 2020-09-17 DIAGNOSIS — E871 Hypo-osmolality and hyponatremia: Secondary | ICD-10-CM | POA: Diagnosis not present

## 2020-09-17 DIAGNOSIS — D62 Acute posthemorrhagic anemia: Secondary | ICD-10-CM | POA: Diagnosis not present

## 2020-09-17 DIAGNOSIS — N183 Chronic kidney disease, stage 3 unspecified: Secondary | ICD-10-CM | POA: Diagnosis not present

## 2020-09-18 DIAGNOSIS — H31091 Other chorioretinal scars, right eye: Secondary | ICD-10-CM | POA: Diagnosis not present

## 2020-09-18 DIAGNOSIS — H18513 Endothelial corneal dystrophy, bilateral: Secondary | ICD-10-CM | POA: Diagnosis not present

## 2020-09-18 DIAGNOSIS — H0102B Squamous blepharitis left eye, upper and lower eyelids: Secondary | ICD-10-CM | POA: Diagnosis not present

## 2020-09-18 DIAGNOSIS — H34832 Tributary (branch) retinal vein occlusion, left eye, with macular edema: Secondary | ICD-10-CM | POA: Diagnosis not present

## 2020-09-18 DIAGNOSIS — H0102A Squamous blepharitis right eye, upper and lower eyelids: Secondary | ICD-10-CM | POA: Diagnosis not present

## 2020-09-18 DIAGNOSIS — H25811 Combined forms of age-related cataract, right eye: Secondary | ICD-10-CM | POA: Diagnosis not present

## 2020-09-18 DIAGNOSIS — H40013 Open angle with borderline findings, low risk, bilateral: Secondary | ICD-10-CM | POA: Diagnosis not present

## 2020-09-18 DIAGNOSIS — H16223 Keratoconjunctivitis sicca, not specified as Sjogren's, bilateral: Secondary | ICD-10-CM | POA: Diagnosis not present

## 2020-09-18 DIAGNOSIS — Z961 Presence of intraocular lens: Secondary | ICD-10-CM | POA: Diagnosis not present

## 2020-09-19 DIAGNOSIS — C569 Malignant neoplasm of unspecified ovary: Secondary | ICD-10-CM | POA: Diagnosis not present

## 2020-09-19 DIAGNOSIS — D62 Acute posthemorrhagic anemia: Secondary | ICD-10-CM | POA: Diagnosis not present

## 2020-09-19 DIAGNOSIS — I10 Essential (primary) hypertension: Secondary | ICD-10-CM | POA: Diagnosis not present

## 2020-09-19 DIAGNOSIS — N183 Chronic kidney disease, stage 3 unspecified: Secondary | ICD-10-CM | POA: Diagnosis not present

## 2020-09-19 DIAGNOSIS — R55 Syncope and collapse: Secondary | ICD-10-CM | POA: Diagnosis not present

## 2020-09-19 DIAGNOSIS — E871 Hypo-osmolality and hyponatremia: Secondary | ICD-10-CM | POA: Diagnosis not present

## 2020-09-25 DIAGNOSIS — N183 Chronic kidney disease, stage 3 unspecified: Secondary | ICD-10-CM | POA: Diagnosis not present

## 2020-09-25 DIAGNOSIS — E871 Hypo-osmolality and hyponatremia: Secondary | ICD-10-CM | POA: Diagnosis not present

## 2020-09-25 DIAGNOSIS — I10 Essential (primary) hypertension: Secondary | ICD-10-CM | POA: Diagnosis not present

## 2020-09-25 DIAGNOSIS — D62 Acute posthemorrhagic anemia: Secondary | ICD-10-CM | POA: Diagnosis not present

## 2020-09-25 DIAGNOSIS — R55 Syncope and collapse: Secondary | ICD-10-CM | POA: Diagnosis not present

## 2020-09-25 DIAGNOSIS — C569 Malignant neoplasm of unspecified ovary: Secondary | ICD-10-CM | POA: Diagnosis not present

## 2020-09-27 DIAGNOSIS — N183 Chronic kidney disease, stage 3 unspecified: Secondary | ICD-10-CM | POA: Diagnosis not present

## 2020-09-27 DIAGNOSIS — D62 Acute posthemorrhagic anemia: Secondary | ICD-10-CM | POA: Diagnosis not present

## 2020-09-27 DIAGNOSIS — I10 Essential (primary) hypertension: Secondary | ICD-10-CM | POA: Diagnosis not present

## 2020-09-27 DIAGNOSIS — C569 Malignant neoplasm of unspecified ovary: Secondary | ICD-10-CM | POA: Diagnosis not present

## 2020-09-27 DIAGNOSIS — E871 Hypo-osmolality and hyponatremia: Secondary | ICD-10-CM | POA: Diagnosis not present

## 2020-09-27 DIAGNOSIS — R55 Syncope and collapse: Secondary | ICD-10-CM | POA: Diagnosis not present

## 2020-09-28 ENCOUNTER — Encounter: Payer: Self-pay | Admitting: Orthopedic Surgery

## 2020-09-28 DIAGNOSIS — R55 Syncope and collapse: Secondary | ICD-10-CM | POA: Diagnosis not present

## 2020-09-28 DIAGNOSIS — M6281 Muscle weakness (generalized): Secondary | ICD-10-CM | POA: Diagnosis not present

## 2020-09-28 DIAGNOSIS — Z9181 History of falling: Secondary | ICD-10-CM | POA: Diagnosis not present

## 2020-09-28 DIAGNOSIS — R2681 Unsteadiness on feet: Secondary | ICD-10-CM | POA: Diagnosis not present

## 2020-09-28 DIAGNOSIS — R32 Unspecified urinary incontinence: Secondary | ICD-10-CM | POA: Diagnosis not present

## 2020-09-28 NOTE — Progress Notes (Signed)
Opened in error  This encounter was created in error - please disregard. 

## 2020-10-02 DIAGNOSIS — M6281 Muscle weakness (generalized): Secondary | ICD-10-CM | POA: Diagnosis not present

## 2020-10-02 DIAGNOSIS — R55 Syncope and collapse: Secondary | ICD-10-CM | POA: Diagnosis not present

## 2020-10-02 DIAGNOSIS — Z9181 History of falling: Secondary | ICD-10-CM | POA: Diagnosis not present

## 2020-10-02 DIAGNOSIS — R32 Unspecified urinary incontinence: Secondary | ICD-10-CM | POA: Diagnosis not present

## 2020-10-02 DIAGNOSIS — R2681 Unsteadiness on feet: Secondary | ICD-10-CM | POA: Diagnosis not present

## 2020-10-03 DIAGNOSIS — M6281 Muscle weakness (generalized): Secondary | ICD-10-CM | POA: Diagnosis not present

## 2020-10-03 DIAGNOSIS — R55 Syncope and collapse: Secondary | ICD-10-CM | POA: Diagnosis not present

## 2020-10-03 DIAGNOSIS — R2681 Unsteadiness on feet: Secondary | ICD-10-CM | POA: Diagnosis not present

## 2020-10-03 DIAGNOSIS — R32 Unspecified urinary incontinence: Secondary | ICD-10-CM | POA: Diagnosis not present

## 2020-10-03 DIAGNOSIS — Z9181 History of falling: Secondary | ICD-10-CM | POA: Diagnosis not present

## 2020-10-04 DIAGNOSIS — R2681 Unsteadiness on feet: Secondary | ICD-10-CM | POA: Diagnosis not present

## 2020-10-04 DIAGNOSIS — R32 Unspecified urinary incontinence: Secondary | ICD-10-CM | POA: Diagnosis not present

## 2020-10-04 DIAGNOSIS — Z9181 History of falling: Secondary | ICD-10-CM | POA: Diagnosis not present

## 2020-10-04 DIAGNOSIS — R55 Syncope and collapse: Secondary | ICD-10-CM | POA: Diagnosis not present

## 2020-10-04 DIAGNOSIS — M6281 Muscle weakness (generalized): Secondary | ICD-10-CM | POA: Diagnosis not present

## 2020-10-05 DIAGNOSIS — R2681 Unsteadiness on feet: Secondary | ICD-10-CM | POA: Diagnosis not present

## 2020-10-05 DIAGNOSIS — R55 Syncope and collapse: Secondary | ICD-10-CM | POA: Diagnosis not present

## 2020-10-05 DIAGNOSIS — M6281 Muscle weakness (generalized): Secondary | ICD-10-CM | POA: Diagnosis not present

## 2020-10-05 DIAGNOSIS — R32 Unspecified urinary incontinence: Secondary | ICD-10-CM | POA: Diagnosis not present

## 2020-10-05 DIAGNOSIS — Z9181 History of falling: Secondary | ICD-10-CM | POA: Diagnosis not present

## 2020-10-09 DIAGNOSIS — M6281 Muscle weakness (generalized): Secondary | ICD-10-CM | POA: Diagnosis not present

## 2020-10-09 DIAGNOSIS — Z9181 History of falling: Secondary | ICD-10-CM | POA: Diagnosis not present

## 2020-10-09 DIAGNOSIS — R55 Syncope and collapse: Secondary | ICD-10-CM | POA: Diagnosis not present

## 2020-10-09 DIAGNOSIS — R32 Unspecified urinary incontinence: Secondary | ICD-10-CM | POA: Diagnosis not present

## 2020-10-09 DIAGNOSIS — R2681 Unsteadiness on feet: Secondary | ICD-10-CM | POA: Diagnosis not present

## 2020-10-10 DIAGNOSIS — R55 Syncope and collapse: Secondary | ICD-10-CM | POA: Diagnosis not present

## 2020-10-10 DIAGNOSIS — Z9181 History of falling: Secondary | ICD-10-CM | POA: Diagnosis not present

## 2020-10-10 DIAGNOSIS — R32 Unspecified urinary incontinence: Secondary | ICD-10-CM | POA: Diagnosis not present

## 2020-10-10 DIAGNOSIS — R2681 Unsteadiness on feet: Secondary | ICD-10-CM | POA: Diagnosis not present

## 2020-10-10 DIAGNOSIS — M6281 Muscle weakness (generalized): Secondary | ICD-10-CM | POA: Diagnosis not present

## 2020-10-11 DIAGNOSIS — Z9181 History of falling: Secondary | ICD-10-CM | POA: Diagnosis not present

## 2020-10-11 DIAGNOSIS — R32 Unspecified urinary incontinence: Secondary | ICD-10-CM | POA: Diagnosis not present

## 2020-10-11 DIAGNOSIS — R2681 Unsteadiness on feet: Secondary | ICD-10-CM | POA: Diagnosis not present

## 2020-10-11 DIAGNOSIS — M6281 Muscle weakness (generalized): Secondary | ICD-10-CM | POA: Diagnosis not present

## 2020-10-11 DIAGNOSIS — R55 Syncope and collapse: Secondary | ICD-10-CM | POA: Diagnosis not present

## 2020-10-15 DIAGNOSIS — R2681 Unsteadiness on feet: Secondary | ICD-10-CM | POA: Diagnosis not present

## 2020-10-15 DIAGNOSIS — M6281 Muscle weakness (generalized): Secondary | ICD-10-CM | POA: Diagnosis not present

## 2020-10-15 DIAGNOSIS — R55 Syncope and collapse: Secondary | ICD-10-CM | POA: Diagnosis not present

## 2020-10-15 DIAGNOSIS — Z9181 History of falling: Secondary | ICD-10-CM | POA: Diagnosis not present

## 2020-10-15 DIAGNOSIS — R32 Unspecified urinary incontinence: Secondary | ICD-10-CM | POA: Diagnosis not present

## 2020-10-16 DIAGNOSIS — R55 Syncope and collapse: Secondary | ICD-10-CM | POA: Diagnosis not present

## 2020-10-16 DIAGNOSIS — R2681 Unsteadiness on feet: Secondary | ICD-10-CM | POA: Diagnosis not present

## 2020-10-16 DIAGNOSIS — Z9181 History of falling: Secondary | ICD-10-CM | POA: Diagnosis not present

## 2020-10-16 DIAGNOSIS — R32 Unspecified urinary incontinence: Secondary | ICD-10-CM | POA: Diagnosis not present

## 2020-10-16 DIAGNOSIS — M6281 Muscle weakness (generalized): Secondary | ICD-10-CM | POA: Diagnosis not present

## 2020-10-17 DIAGNOSIS — Z9181 History of falling: Secondary | ICD-10-CM | POA: Diagnosis not present

## 2020-10-17 DIAGNOSIS — R55 Syncope and collapse: Secondary | ICD-10-CM | POA: Diagnosis not present

## 2020-10-17 DIAGNOSIS — R32 Unspecified urinary incontinence: Secondary | ICD-10-CM | POA: Diagnosis not present

## 2020-10-17 DIAGNOSIS — R2681 Unsteadiness on feet: Secondary | ICD-10-CM | POA: Diagnosis not present

## 2020-10-17 DIAGNOSIS — M6281 Muscle weakness (generalized): Secondary | ICD-10-CM | POA: Diagnosis not present

## 2020-10-18 DIAGNOSIS — R55 Syncope and collapse: Secondary | ICD-10-CM | POA: Diagnosis not present

## 2020-10-18 DIAGNOSIS — L821 Other seborrheic keratosis: Secondary | ICD-10-CM | POA: Diagnosis not present

## 2020-10-18 DIAGNOSIS — L820 Inflamed seborrheic keratosis: Secondary | ICD-10-CM | POA: Diagnosis not present

## 2020-10-18 DIAGNOSIS — M6281 Muscle weakness (generalized): Secondary | ICD-10-CM | POA: Diagnosis not present

## 2020-10-18 DIAGNOSIS — R32 Unspecified urinary incontinence: Secondary | ICD-10-CM | POA: Diagnosis not present

## 2020-10-18 DIAGNOSIS — L570 Actinic keratosis: Secondary | ICD-10-CM | POA: Diagnosis not present

## 2020-10-18 DIAGNOSIS — Z9181 History of falling: Secondary | ICD-10-CM | POA: Diagnosis not present

## 2020-10-18 DIAGNOSIS — R2681 Unsteadiness on feet: Secondary | ICD-10-CM | POA: Diagnosis not present

## 2020-10-18 DIAGNOSIS — L814 Other melanin hyperpigmentation: Secondary | ICD-10-CM | POA: Diagnosis not present

## 2020-10-19 DIAGNOSIS — R32 Unspecified urinary incontinence: Secondary | ICD-10-CM | POA: Diagnosis not present

## 2020-10-19 DIAGNOSIS — R55 Syncope and collapse: Secondary | ICD-10-CM | POA: Diagnosis not present

## 2020-10-19 DIAGNOSIS — M6281 Muscle weakness (generalized): Secondary | ICD-10-CM | POA: Diagnosis not present

## 2020-10-19 DIAGNOSIS — R2681 Unsteadiness on feet: Secondary | ICD-10-CM | POA: Diagnosis not present

## 2020-10-19 DIAGNOSIS — Z9181 History of falling: Secondary | ICD-10-CM | POA: Diagnosis not present

## 2020-10-22 DIAGNOSIS — R2681 Unsteadiness on feet: Secondary | ICD-10-CM | POA: Diagnosis not present

## 2020-10-22 DIAGNOSIS — R55 Syncope and collapse: Secondary | ICD-10-CM | POA: Diagnosis not present

## 2020-10-22 DIAGNOSIS — R32 Unspecified urinary incontinence: Secondary | ICD-10-CM | POA: Diagnosis not present

## 2020-10-22 DIAGNOSIS — M6281 Muscle weakness (generalized): Secondary | ICD-10-CM | POA: Diagnosis not present

## 2020-10-22 DIAGNOSIS — Z9181 History of falling: Secondary | ICD-10-CM | POA: Diagnosis not present

## 2020-10-24 DIAGNOSIS — R55 Syncope and collapse: Secondary | ICD-10-CM | POA: Diagnosis not present

## 2020-10-24 DIAGNOSIS — R2681 Unsteadiness on feet: Secondary | ICD-10-CM | POA: Diagnosis not present

## 2020-10-24 DIAGNOSIS — R32 Unspecified urinary incontinence: Secondary | ICD-10-CM | POA: Diagnosis not present

## 2020-10-24 DIAGNOSIS — M6281 Muscle weakness (generalized): Secondary | ICD-10-CM | POA: Diagnosis not present

## 2020-10-24 DIAGNOSIS — Z9181 History of falling: Secondary | ICD-10-CM | POA: Diagnosis not present

## 2020-10-25 DIAGNOSIS — R32 Unspecified urinary incontinence: Secondary | ICD-10-CM | POA: Diagnosis not present

## 2020-10-25 DIAGNOSIS — M6281 Muscle weakness (generalized): Secondary | ICD-10-CM | POA: Diagnosis not present

## 2020-10-25 DIAGNOSIS — R2681 Unsteadiness on feet: Secondary | ICD-10-CM | POA: Diagnosis not present

## 2020-10-25 DIAGNOSIS — R55 Syncope and collapse: Secondary | ICD-10-CM | POA: Diagnosis not present

## 2020-10-25 DIAGNOSIS — Z9181 History of falling: Secondary | ICD-10-CM | POA: Diagnosis not present

## 2020-10-26 DIAGNOSIS — Z9181 History of falling: Secondary | ICD-10-CM | POA: Diagnosis not present

## 2020-10-26 DIAGNOSIS — R2681 Unsteadiness on feet: Secondary | ICD-10-CM | POA: Diagnosis not present

## 2020-10-26 DIAGNOSIS — R55 Syncope and collapse: Secondary | ICD-10-CM | POA: Diagnosis not present

## 2020-10-26 DIAGNOSIS — R32 Unspecified urinary incontinence: Secondary | ICD-10-CM | POA: Diagnosis not present

## 2020-10-26 DIAGNOSIS — M6281 Muscle weakness (generalized): Secondary | ICD-10-CM | POA: Diagnosis not present

## 2020-10-29 DIAGNOSIS — Z9181 History of falling: Secondary | ICD-10-CM | POA: Diagnosis not present

## 2020-10-29 DIAGNOSIS — M6281 Muscle weakness (generalized): Secondary | ICD-10-CM | POA: Diagnosis not present

## 2020-10-29 DIAGNOSIS — R2681 Unsteadiness on feet: Secondary | ICD-10-CM | POA: Diagnosis not present

## 2020-10-29 DIAGNOSIS — R32 Unspecified urinary incontinence: Secondary | ICD-10-CM | POA: Diagnosis not present

## 2020-10-29 DIAGNOSIS — R55 Syncope and collapse: Secondary | ICD-10-CM | POA: Diagnosis not present

## 2020-10-30 DIAGNOSIS — Z9181 History of falling: Secondary | ICD-10-CM | POA: Diagnosis not present

## 2020-10-30 DIAGNOSIS — R2681 Unsteadiness on feet: Secondary | ICD-10-CM | POA: Diagnosis not present

## 2020-10-30 DIAGNOSIS — R55 Syncope and collapse: Secondary | ICD-10-CM | POA: Diagnosis not present

## 2020-10-30 DIAGNOSIS — M6281 Muscle weakness (generalized): Secondary | ICD-10-CM | POA: Diagnosis not present

## 2020-10-30 DIAGNOSIS — R32 Unspecified urinary incontinence: Secondary | ICD-10-CM | POA: Diagnosis not present

## 2020-11-01 DIAGNOSIS — R32 Unspecified urinary incontinence: Secondary | ICD-10-CM | POA: Diagnosis not present

## 2020-11-01 DIAGNOSIS — M6281 Muscle weakness (generalized): Secondary | ICD-10-CM | POA: Diagnosis not present

## 2020-11-01 DIAGNOSIS — Z9181 History of falling: Secondary | ICD-10-CM | POA: Diagnosis not present

## 2020-11-01 DIAGNOSIS — R55 Syncope and collapse: Secondary | ICD-10-CM | POA: Diagnosis not present

## 2020-11-01 DIAGNOSIS — R2681 Unsteadiness on feet: Secondary | ICD-10-CM | POA: Diagnosis not present

## 2020-11-02 DIAGNOSIS — R55 Syncope and collapse: Secondary | ICD-10-CM | POA: Diagnosis not present

## 2020-11-02 DIAGNOSIS — Z9181 History of falling: Secondary | ICD-10-CM | POA: Diagnosis not present

## 2020-11-02 DIAGNOSIS — M6281 Muscle weakness (generalized): Secondary | ICD-10-CM | POA: Diagnosis not present

## 2020-11-02 DIAGNOSIS — R2681 Unsteadiness on feet: Secondary | ICD-10-CM | POA: Diagnosis not present

## 2020-11-02 DIAGNOSIS — R32 Unspecified urinary incontinence: Secondary | ICD-10-CM | POA: Diagnosis not present

## 2020-11-06 DIAGNOSIS — R55 Syncope and collapse: Secondary | ICD-10-CM | POA: Diagnosis not present

## 2020-11-06 DIAGNOSIS — Z9181 History of falling: Secondary | ICD-10-CM | POA: Diagnosis not present

## 2020-11-06 DIAGNOSIS — M6281 Muscle weakness (generalized): Secondary | ICD-10-CM | POA: Diagnosis not present

## 2020-11-06 DIAGNOSIS — R32 Unspecified urinary incontinence: Secondary | ICD-10-CM | POA: Diagnosis not present

## 2020-11-06 DIAGNOSIS — R2681 Unsteadiness on feet: Secondary | ICD-10-CM | POA: Diagnosis not present

## 2020-11-07 DIAGNOSIS — R32 Unspecified urinary incontinence: Secondary | ICD-10-CM | POA: Diagnosis not present

## 2020-11-07 DIAGNOSIS — R55 Syncope and collapse: Secondary | ICD-10-CM | POA: Diagnosis not present

## 2020-11-07 DIAGNOSIS — M6281 Muscle weakness (generalized): Secondary | ICD-10-CM | POA: Diagnosis not present

## 2020-11-07 DIAGNOSIS — Z9181 History of falling: Secondary | ICD-10-CM | POA: Diagnosis not present

## 2020-11-07 DIAGNOSIS — R2681 Unsteadiness on feet: Secondary | ICD-10-CM | POA: Diagnosis not present

## 2020-11-08 NOTE — Telephone Encounter (Signed)
error 

## 2020-11-15 ENCOUNTER — Non-Acute Institutional Stay: Payer: Medicare Other | Admitting: Internal Medicine

## 2020-11-15 ENCOUNTER — Encounter: Payer: Self-pay | Admitting: Internal Medicine

## 2020-11-15 DIAGNOSIS — K219 Gastro-esophageal reflux disease without esophagitis: Secondary | ICD-10-CM | POA: Diagnosis not present

## 2020-11-15 DIAGNOSIS — F5101 Primary insomnia: Secondary | ICD-10-CM

## 2020-11-15 DIAGNOSIS — I10 Essential (primary) hypertension: Secondary | ICD-10-CM | POA: Diagnosis not present

## 2020-11-15 DIAGNOSIS — E871 Hypo-osmolality and hyponatremia: Secondary | ICD-10-CM | POA: Diagnosis not present

## 2020-11-15 DIAGNOSIS — N1831 Chronic kidney disease, stage 3a: Secondary | ICD-10-CM | POA: Diagnosis not present

## 2020-11-15 DIAGNOSIS — R4189 Other symptoms and signs involving cognitive functions and awareness: Secondary | ICD-10-CM | POA: Diagnosis not present

## 2020-11-15 NOTE — Progress Notes (Signed)
Location:   [facility] Room Number: 10 Place of Service:  [phone]) Provider:  Veleta Miners MD   Virgie Dad, MD  Patient Care Team: Virgie Dad, MD as PCP - General (Internal Medicine) Donato Heinz, MD as PCP - Cardiology (Cardiology) Brien Few, MD as Consulting Physician (Obstetrics and Gynecology) Marti Sleigh, MD as Consulting Physician (Gynecology) Nancy Marus, MD as Consulting Physician (Gynecologic Oncology)  Extended Emergency Contact Information Primary Emergency Contact: [facility] Address: [address]          [place], [place] [address] [name] of [place] Phone: [phone] Mobile Phone: [phone] Relation: Daughter Secondary Emergency Contact: [place] Mobile Phone: [phone] Relation: Son  Code Status:  Full Code  Goals of care: Advanced Directive information Advanced Directives 11/15/2020  Does Patient Have a Medical Advance Directive? Yes  Type of Paramedic of [place];Living will  Does patient want to make changes to medical advance directive? No - Patient declined  Copy of [place] in Chart? Yes - validated most recent copy scanned in chart (See row information)  Would patient like information on creating a medical advance directive? -  Pre-existing out of facility DNR order (yellow form or pink MOST form) -     Chief Complaint  Patient presents with   Medical Management of Chronic Issues   Quality Metric Gaps    Shingrix, flu shot, TDAP    HPI:  Pt is a 87 y.o. female seen today for medical management of chronic diseases.    Patient has h/o Hypertension,Hyponatremia, Syncope, Cognitive impairment, HLD, Atrial Flutter not on Anticoagulation due to Falls  Doing well Was transferred to AL from SNF as he is able to stay independent in her [place] with her walker. No falls Cognitively doing well also Had no  complains today No Nursing issues No Behaviors Has gained weight     Past Medical History:  Diagnosis Date   Anemia associated with acute blood loss 04/03/2011   Arthritis    knees    Ascites    Blood transfusion    hx of 7425    Complication of anesthesia    Sodium drops per pt    Cystitis    DIARRHEA, ANTIBIOTIC ASSOCIATED 05/31/2009   Qualifier: Diagnosis of  By: Tommy Medal MD, Cornelius     GERD (gastroesophageal reflux disease)    H/O hiatal hernia    Hyperlipidemia    Hypertension    Hyponatremia    Neutropenia with fever ([place]) 05/18/2011   OSTEOMYELITIS, CHRONIC, LOWER LEG 04/23/2009   Qualifier: Diagnosis of  By: Johnnye Sima MD, Felecia Shelling    OSTEOPOROSIS 04/23/2009   Qualifier: Diagnosis of  By: Johnnye Sima MD, Jeffrey     Ovarian cancer ([place]) 01/25/2011   Pneumonia    hx of    Postmenopausal atrophic vaginitis    PROSTHETIC JOINT COMPLICATION 9/56/3875   Qualifier: Diagnosis of  By: Tommy Medal MD, Cornelius     Renal disorder    Decreased kidney function   Staph infection 2010   after knee replacement   Urinary frequency    Vulvitis    Past Surgical History:  Procedure Laterality Date   ABDOMINAL HYSTERECTOMY  04/01/2011   Procedure: HYSTERECTOMY ABDOMINAL;  Surgeon: Alvino Chapel, MD;  Location: [facility];  Service: Gynecology;  Laterality: N/A;   APPENDECTOMY     JOINT REPLACEMENT     R knee in 2008, 5  operations on L knee   LAPAROTOMY  04/01/2011   Procedure: EXPLORATORY LAPAROTOMY;  Surgeon: Alvino Chapel, MD;  Location: [facility];  Service: Gynecology;  Laterality: N/A;   OTHER SURGICAL HISTORY     hx of C section 1966   SALPINGOOPHORECTOMY  04/01/2011   Procedure: SALPINGO OOPHERECTOMY;  Surgeon: Alvino Chapel, MD;  Location: [facility];  Service: Gynecology;  Laterality: Bilateral;   TONSILLECTOMY      Allergies  Allergen Reactions   Morphine And Related Anaphylaxis and Other (See Comments)    Given after knee replacement and  code blue occurred   Morphine Sulfate Anaphylaxis   Penicillins Hives, Itching and Other (See Comments)    Syncope, also   Nsaids Other (See Comments)    Because of an abnormal kidney test result   Penicillin V Hives   Shellfish Allergy Nausea And Vomiting and Other (See Comments)    Pt has shellfish allergy only.  Has had IV contrast x 2 and did fine.   Shellfish-Derived Products Nausea And Vomiting    Allergies as of 11/15/2020       Reactions   Morphine And Related Anaphylaxis, Other (See Comments)   Given after knee replacement and code blue occurred   Morphine Sulfate Anaphylaxis   Penicillins Hives, Itching, Other (See Comments)   Syncope, also   Nsaids Other (See Comments)   Because of an abnormal kidney test result   Penicillin V Hives   Shellfish Allergy Nausea And Vomiting, Other (See Comments)   Pt has shellfish allergy only.  Has had IV contrast x 2 and did fine.   Shellfish-derived Products Nausea And Vomiting        Medication List        Accurate as of November 15, 2020 10:13 AM. If you have any questions, ask your nurse or doctor.          STOP taking these medications    hydrALAZINE 10 MG tablet Commonly known as: APRESOLINE Stopped by: Virgie Dad, MD       TAKE these medications    acetaminophen 500 MG tablet Commonly known as: TYLENOL Take 1,000 mg by mouth every 8 (eight) hours as needed.   azelastine 0.1 % nasal spray Commonly known as: ASTELIN Place 1 spray into both nostrils daily as needed for rhinitis or allergies.   B-COMPLEX/FOLIC ACID/VITAMIN C PO Take 1 tablet by mouth daily with breakfast.   calcium carbonate 750 MG chewable tablet Commonly known as: TUMS EX Chew 750 mg by mouth daily as needed for heartburn.   cycloSPORINE 0.05 % ophthalmic emulsion Commonly known as: RESTASIS Place 1 drop into both eyes 2 (two) times daily.   diclofenac Sodium 1 % Gel Commonly known as: VOLTAREN Apply 2 g topically 4 (four) times  daily.   docusate sodium 100 MG capsule Commonly known as: COLACE Take 100 mg by mouth daily as needed for mild constipation.   Ensure Take 237 mLs by mouth. 223mLS., oral, Twice A Day, Give Ensure 237MLs/Boost 237MLS Strawberry Liquid oral per family request W/Breakfast and 2PM to increase Nutritional adequacy. Doc.% consumed. Family will provide.   meclizine 12.5 MG tablet Commonly known as: ANTIVERT Take 1 tablet (12.5 mg total) by mouth 2 (two) times daily as needed for dizziness.   melatonin 3 MG Tabs tablet Take 1 tablet (3 mg total) by mouth at bedtime.   pantoprazole 40 MG tablet Commonly known as: PROTONIX Take 1 tablet (40 mg total) by mouth 2 (  two) times daily.   sodium chloride 1 g tablet Take 1 g by mouth 2 (two) times daily.   Vitamin D3 50 MCG (2000 UT) Tabs Take 2,000 Units by mouth in the morning.   zinc oxide 20 % ointment Apply 1 application topically as needed (for redness- to be applied to buttocks/peri area and after every incontinent episode).        Review of Systems Review of Systems  Constitutional: Negative for activity change, appetite change, chills, diaphoresis, fatigue and fever.  HENT: Negative for mouth sores, postnasal drip, rhinorrhea, sinus pain and sore throat.   Respiratory: Negative for apnea, cough, chest tightness, shortness of breath and wheezing.   Cardiovascular: Negative for chest pain, palpitations and leg swelling.  Gastrointestinal: Negative for abdominal distention, abdominal pain, constipation, diarrhea, nausea and vomiting.  Genitourinary: Negative for dysuria and frequency.  Musculoskeletal: Negative for arthralgias, joint swelling and myalgias.  Skin: Negative for rash.  Neurological: Negative for dizziness, syncope, weakness, light-headedness and numbness.  Psychiatric/Behavioral: Negative for behavioral problems, confusion and sleep disturbance.    Immunization History  Administered Date(s) Administered    Influenza Split 11/13/2012, 12/13/2012, 11/30/2013, 10/09/2014, 11/24/2018   Influenza, High Dose Seasonal PF 12/01/2013, 10/04/2014, 10/12/2015, 11/06/2016, 11/12/2017   Influenza,inj,Quad PF,6+ Mos 10/24/2010   Influenza-Unspecified 10/24/2010   Moderna SARS-COV2 Booster Vaccination 07/18/2020   Moderna Sars-Covid-2 Vaccination 02/14/2019, 03/14/2019, 12/26/2019   Pneumococcal Conjugate-13 09/28/2013   Pneumococcal Polysaccharide-23 03/05/2012   Td 08/27/2000   Tdap 08/27/2000, 10/16/2010   Zoster, Live 01/11/2013, 03/31/2020   Pertinent  Health Maintenance Due  Topic Date Due   INFLUENZA VACCINE  09/10/2020   DEXA SCAN  Completed   Fall Risk  08/16/2020 03/02/2014  Falls in the past year? 1 No  Number falls in past yr: 1 -  Injury with Fall? 0 -  Risk for fall due to : History of fall(s);Impaired balance/gait;Impaired mobility;No Fall Risks -  Follow up Falls evaluation completed;Education provided;Falls prevention discussed -   Functional Status Survey:    Vitals:   11/15/20 1002  BP: 131/70  Pulse: 68  Resp: 18  Temp: (!) 97 F (36.1 C)  SpO2: 96%  Weight: 176 lb 3.2 oz (79.9 kg)  Height: 5\' 3"  (1.6 m)   Body mass index is 31.21 kg/m. Physical Exam Constitutional: Oriented to person, place, and time. Well-developed and well-nourished.  HENT:  Head: Normocephalic.  Mouth/Throat: Oropharynx is clear and moist.  Eyes: Pupils are equal, round, and reactive to light.  Neck: Neck supple.  Cardiovascular: Normal rate and normal heart sounds.  No murmur heard. Pulmonary/Chest: Effort normal and breath sounds normal. No respiratory distress. No wheezes. She has no rales.  Abdominal: Soft. Bowel sounds are normal. No distension. There is no tenderness. There is no rebound.  Musculoskeletal: Mild Edema Bilateral   Lymphadenopathy: none Neurological: Alert and oriented to person, place, and time.  Walks with her walker Skin: Skin is warm and dry.  Psychiatric: Normal  mood and affect. Behavior is normal. Thought content normal.   Labs reviewed: Recent Labs    04/22/20 0109 04/23/20 0652 04/24/20 0058 04/28/20 1641 04/29/20 0521 04/30/20 0340 05/03/20 0000 05/10/20 0000 05/14/20 0000 06/11/20 0000  NA 126* 128* 127* 128* 127* 130*   < > 133* 132* 135*  K 4.1 4.2 4.3 5.2* 4.5 4.6   < > 4.4 4.6 4.5  CL 98 96* 97* 97* 98 100   < > 100 99 99  CO2 22 25 25  20* 22 24   < >  23* 24* 28*  GLUCOSE 111* 142* 109* 160* 119* 97  --   --   --   --   BUN 16 17 23  30* 33* 29*   < > 23* 33* 21  CREATININE 0.83 0.97 0.97 1.16* 1.37* 1.06*   < > 0.8 1.0 0.9  CALCIUM 8.4* 8.6* 8.3* 9.0 8.9 8.7*   < > 9.0 9.2 9.6  MG 1.6* 1.6* 1.6*  --   --   --   --   --   --   --   PHOS 2.8 2.6 3.1  --   --  2.8  --   --   --   --    < > = values in this interval not displayed.   Recent Labs    04/22/20 0109 04/23/20 0652 04/24/20 0058 04/30/20 0340 06/11/20 0000  AST 22 33 23  --  26  ALT 16 25 20   --  14  ALKPHOS 75 103 77  --  86  BILITOT 1.0 0.9 0.7  --   --   PROT 6.5 6.9 5.8*  --   --   ALBUMIN 2.5* 2.5* 2.2* 2.4* 3.4*   Recent Labs    04/22/20 0109 04/23/20 0652 04/24/20 0058 04/28/20 1641 04/29/20 0521  WBC 8.4 6.7 7.1 12.2* 10.2  NEUTROABS 5.6 4.4 4.2  --   --   HGB 10.8* 11.9* 10.3* 14.6 12.0  HCT 32.5* 34.9* 31.0* 43.5 34.9*  MCV 84.9 83.9 84.9 85.1 83.5  PLT 322 336 332 441* 348   Lab Results  Component Value Date   TSH 2.357 04/06/2020   No results found for: HGBA1C No results found for: CHOL, HDL, LDLCALC, LDLDIRECT, TRIG, CHOLHDL  Significant Diagnostic Results in last 30 days:  No results found.  Assessment/Plan Hyponatremia Recheck BMP On Sodium Tabs Essential hypertension Not on Any meds now Loose Control  Gastroesophageal reflux disease, On Protonix BID Cognitive impairment Supportive Care Now Independent in her [place] Primary insomnia On Melatonin Stage 3a chronic kidney disease (HCC) Creat stable Atrial flutter,  unspecified type ([place]) No Anticoagulation due to Falls and Hypotension  Family/ staff Communication:   Labs/tests ordered:  CMP,CBC

## 2020-11-19 DIAGNOSIS — F0390 Unspecified dementia without behavioral disturbance: Secondary | ICD-10-CM | POA: Diagnosis not present

## 2020-11-19 DIAGNOSIS — D649 Anemia, unspecified: Secondary | ICD-10-CM | POA: Diagnosis not present

## 2020-11-19 LAB — CBC AND DIFFERENTIAL
HCT: 36 (ref 36–46)
Hemoglobin: 11.3 — AB (ref 12.0–16.0)
Platelets: 222 (ref 150–399)
WBC: 5.4

## 2020-11-19 LAB — HEPATIC FUNCTION PANEL
ALT: 9 (ref 7–35)
AST: 16 (ref 13–35)
Alkaline Phosphatase: 56 (ref 25–125)
Bilirubin, Total: 0.3

## 2020-11-19 LAB — COMPREHENSIVE METABOLIC PANEL
Albumin: 3.5 (ref 3.5–5.0)
Calcium: 9.2 (ref 8.7–10.7)
Globulin: 3.1

## 2020-11-19 LAB — BASIC METABOLIC PANEL
BUN: 34 — AB (ref 4–21)
CO2: 27 — AB (ref 13–22)
Chloride: 105 (ref 99–108)
Creatinine: 1 (ref 0.5–1.1)
Glucose: 95
Potassium: 4.6 (ref 3.4–5.3)
Sodium: 138 (ref 137–147)

## 2020-11-19 LAB — CBC: RBC: 4.31 (ref 3.87–5.11)

## 2020-11-20 ENCOUNTER — Encounter: Payer: Self-pay | Admitting: Nurse Practitioner

## 2020-11-21 DIAGNOSIS — L814 Other melanin hyperpigmentation: Secondary | ICD-10-CM | POA: Diagnosis not present

## 2020-11-21 DIAGNOSIS — L821 Other seborrheic keratosis: Secondary | ICD-10-CM | POA: Diagnosis not present

## 2020-11-21 DIAGNOSIS — L570 Actinic keratosis: Secondary | ICD-10-CM | POA: Diagnosis not present

## 2020-12-24 ENCOUNTER — Ambulatory Visit: Payer: Medicare Other | Admitting: Cardiology

## 2021-01-10 DIAGNOSIS — L814 Other melanin hyperpigmentation: Secondary | ICD-10-CM | POA: Diagnosis not present

## 2021-01-10 DIAGNOSIS — L570 Actinic keratosis: Secondary | ICD-10-CM | POA: Diagnosis not present

## 2021-02-01 DIAGNOSIS — B351 Tinea unguium: Secondary | ICD-10-CM | POA: Diagnosis not present

## 2021-02-18 ENCOUNTER — Non-Acute Institutional Stay: Payer: Medicare Other | Admitting: Orthopedic Surgery

## 2021-02-18 ENCOUNTER — Encounter: Payer: Self-pay | Admitting: Orthopedic Surgery

## 2021-02-18 DIAGNOSIS — I10 Essential (primary) hypertension: Secondary | ICD-10-CM

## 2021-02-18 DIAGNOSIS — I4892 Unspecified atrial flutter: Secondary | ICD-10-CM | POA: Diagnosis not present

## 2021-02-18 DIAGNOSIS — R635 Abnormal weight gain: Secondary | ICD-10-CM

## 2021-02-18 DIAGNOSIS — K219 Gastro-esophageal reflux disease without esophagitis: Secondary | ICD-10-CM | POA: Diagnosis not present

## 2021-02-18 DIAGNOSIS — R4189 Other symptoms and signs involving cognitive functions and awareness: Secondary | ICD-10-CM

## 2021-02-18 DIAGNOSIS — E871 Hypo-osmolality and hyponatremia: Secondary | ICD-10-CM

## 2021-02-18 DIAGNOSIS — M25571 Pain in right ankle and joints of right foot: Secondary | ICD-10-CM

## 2021-02-18 DIAGNOSIS — G8929 Other chronic pain: Secondary | ICD-10-CM

## 2021-02-18 DIAGNOSIS — N1831 Chronic kidney disease, stage 3a: Secondary | ICD-10-CM

## 2021-02-18 DIAGNOSIS — K5901 Slow transit constipation: Secondary | ICD-10-CM

## 2021-02-18 DIAGNOSIS — F5101 Primary insomnia: Secondary | ICD-10-CM

## 2021-02-18 NOTE — Progress Notes (Signed)
Location:   [place] Room Number: [place] of Service:  [phone]) Provider:  Windell Moulding, NP    Patient Care Team: Virgie Dad, MD as PCP - General (Internal Medicine) Donato Heinz, MD as PCP - Cardiology (Cardiology) Brien Few, MD as Consulting Physician (Obstetrics and Gynecology) Marti Sleigh, MD as Consulting Physician (Gynecology) Nancy Marus, MD as Consulting Physician (Gynecologic Oncology)  Extended Emergency Contact Information Primary Emergency Contact: [facility] Address: [address]          [place], [place] [address] [name] of [place] Phone: [phone] Mobile Phone: [phone] Relation: Daughter Secondary Emergency Contact: [place] Mobile Phone: [phone] Relation: Son  Code Status:  FULL CODE Goals of care: Advanced Directive information Advanced Directives 02/18/2021  Does Patient Have a Medical Advance Directive? Yes  Type of Paramedic of [place];Living will  Does patient want to make changes to medical advance directive? No - Patient declined  Copy of [place] in Chart? Yes - validated most recent copy scanned in chart (See row information)  Would patient like information on creating a medical advance directive? -  Pre-existing out of facility DNR order (yellow form or pink MOST form) -     Chief Complaint  Patient presents with   Medical Management of Chronic Issues    Routine Visit.    Immunizations    Discuss the need for Shingrix vaccine, Covid Booster, and Tetanus vaccine.    HPI:  Pt is a 88 y.o. female seen today for medical management of chronic diseases.    She currently resides on the assisted living unit at [facility] PMH: atherosclerosis of abdominal aorta, HTN, atrial flutter, orthostatic hypotension, syncope, GERD, constipation, ovarian cancer, OA, CKD III, anemia, obesity and insomnia.   She is very  happy in assisted living. Often seen interacting with others and attending group activities. She will use power wheelchair to ambulate long distances. Ambulates to dining and activities room with walker. No recent falls or injuries.   HTN- BUN/creat 34/1.0 11/19/2020, not on scheduled medications at this time due to orthostatic hypotension, hydralazine prn for SBP> 170 Hyponatremia- Na 138 11/19/2020, remains on sodium tablets Atrial flutter- revealed on 30-day monitor 06/04/2020, not on anticoagulation due to falls GERD- hgb 11.3 11/19/2020, denies increased reflux, remains on Protonix and TUMS prn Cognitive impairment- no recent behavioral outbursts, does ADLs Constipation- LBM 01/09, reports regular bowel movements, remains on colace prn Chronic right foot pain- continues to wear brace, believes pain would be improved with new orthotics, reports using them in the past and losing them, remains on Voltaren gel and tylenol prn Insomnia- sleeping well at night, remains on melatonin Weight gain- up 12 lbs from 2 months ago, admits to snacking and increased portion sizes, Boost recently discontinued  Recent blood pressures:  01/04- 159/85  12/28- 151/81  12/21- 160/90  Recent weights:  01/02- 190.6 lbs  12/04- 182.2 lbs  11/15- 178.8 lbs   Past Medical History:  Diagnosis Date   Anemia associated with acute blood loss 04/03/2011   Arthritis    knees    Ascites    Blood transfusion    hx of 7510    Complication of anesthesia    Sodium drops per pt    Cystitis    DIARRHEA, ANTIBIOTIC ASSOCIATED 05/31/2009   Qualifier: Diagnosis of  By: Tommy Medal MD, Cornelius     GERD (gastroesophageal reflux disease)    H/O hiatal hernia  Hyperlipidemia    Hypertension    Hyponatremia    Neutropenia with fever ([place]) 05/18/2011   OSTEOMYELITIS, CHRONIC, LOWER LEG 04/23/2009   Qualifier: Diagnosis of  By: Johnnye Sima MD, Felecia Shelling    OSTEOPOROSIS 04/23/2009   Qualifier: Diagnosis of  By:  Johnnye Sima MD, Dellis Filbert     Ovarian cancer ([facility] [place]) 01/25/2011   Pneumonia    hx of    Postmenopausal atrophic vaginitis    PROSTHETIC JOINT COMPLICATION 3/76/2831   Qualifier: Diagnosis of  By: Tommy Medal MD, Cornelius     Renal disorder    Decreased kidney function   Staph infection 2010   after knee replacement   Urinary frequency    Vulvitis    Past Surgical History:  Procedure Laterality Date   ABDOMINAL HYSTERECTOMY  04/01/2011   Procedure: HYSTERECTOMY ABDOMINAL;  Surgeon: Alvino Chapel, MD;  Location: [facility];  Service: Gynecology;  Laterality: N/A;   APPENDECTOMY     JOINT REPLACEMENT     R knee in 2008, 5 operations on L knee   LAPAROTOMY  04/01/2011   Procedure: EXPLORATORY LAPAROTOMY;  Surgeon: Alvino Chapel, MD;  Location: [facility];  Service: Gynecology;  Laterality: N/A;   OTHER SURGICAL HISTORY     hx of C section 1966   SALPINGOOPHORECTOMY  04/01/2011   Procedure: SALPINGO OOPHERECTOMY;  Surgeon: Alvino Chapel, MD;  Location: [facility];  Service: Gynecology;  Laterality: Bilateral;   TONSILLECTOMY      Allergies  Allergen Reactions   Morphine And Related Anaphylaxis and Other (See Comments)    Given after knee replacement and code blue occurred   Morphine Sulfate Anaphylaxis   Penicillins Hives, Itching and Other (See Comments)    Syncope, also   Nsaids Other (See Comments)    Because of an abnormal kidney test result   Penicillin V Hives   Shellfish Allergy Nausea And Vomiting and Other (See Comments)    Pt has shellfish allergy only.  Has had IV contrast x 2 and did fine.   Shellfish-Derived Products Nausea And Vomiting    Allergies as of 02/18/2021       Reactions   Morphine And Related Anaphylaxis, Other (See Comments)   Given after knee replacement and code blue occurred   Morphine Sulfate Anaphylaxis   Penicillins Hives, Itching, Other (See Comments)   Syncope, also   Nsaids Other (See Comments)   Because of an abnormal kidney test  result   Penicillin V Hives   Shellfish Allergy Nausea And Vomiting, Other (See Comments)   Pt has shellfish allergy only.  Has had IV contrast x 2 and did fine.   Shellfish-derived Products Nausea And Vomiting        Medication List        Accurate as of February 18, 2021  9:57 AM. If you have any questions, ask your nurse or doctor.          acetaminophen 500 MG tablet Commonly known as: TYLENOL Take 1,000 mg by mouth every 8 (eight) hours as needed.   azelastine 0.1 % nasal spray Commonly known as: ASTELIN Place 1 spray into both nostrils daily as needed for rhinitis or allergies.   B-COMPLEX/FOLIC ACID/VITAMIN C PO Take 1 tablet by mouth daily with breakfast.   calcium carbonate 750 MG chewable tablet Commonly known as: TUMS EX Chew 750 mg by mouth daily as needed for heartburn.   cycloSPORINE 0.05 % ophthalmic emulsion Commonly known as: RESTASIS Place  1 drop into both eyes 2 (two) times daily.   diclofenac Sodium 1 % Gel Commonly known as: VOLTAREN Apply 2 g topically in the morning and at bedtime. What changed: Another medication with the same name was removed. Continue taking this medication, and follow the directions you see here. Changed by: Yvonna Alanis, NP   docusate sodium 100 MG capsule Commonly known as: COLACE Take 100 mg by mouth daily as needed for mild constipation.   Ensure Take 237 mLs by mouth. 224mLS., oral, Twice A Day, Give Ensure 237MLs/Boost 237MLS Strawberry Liquid oral per family request W/Breakfast and 2PM to increase Nutritional adequacy. Doc.% consumed. Family will provide.   hydrALAZINE 10 MG tablet Commonly known as: APRESOLINE Take 10 mg by mouth 2 (two) times daily as needed.   meclizine 12.5 MG tablet Commonly known as: ANTIVERT Take 1 tablet (12.5 mg total) by mouth 2 (two) times daily as needed for dizziness.   melatonin 3 MG Tabs tablet Take 1 tablet (3 mg total) by mouth at bedtime.   pantoprazole 40 MG  tablet Commonly known as: PROTONIX Take 1 tablet (40 mg total) by mouth 2 (two) times daily.   sodium chloride 1 g tablet Take 1 g by mouth 2 (two) times daily.   Vitamin D3 50 MCG (2000 UT) Tabs Take 2,000 Units by mouth in the morning.   white petrolatum Gel Commonly known as: VASELINE Apply 1 application topically daily as needed for lip care.   zinc oxide 20 % ointment Apply 1 application topically as needed (for redness- to be applied to buttocks/peri area and after every incontinent episode).        Review of Systems  Constitutional:  Negative for activity change, chills, fatigue and fever.  HENT:  Positive for hearing loss. Negative for trouble swallowing.   Eyes:  Negative for visual disturbance.  Respiratory:  Negative for cough, shortness of breath and wheezing.   Cardiovascular:  Negative for chest pain and leg swelling.  Gastrointestinal:  Positive for constipation. Negative for abdominal pain, diarrhea and nausea.  Genitourinary:  Negative for dysuria, frequency and hematuria.  Musculoskeletal:  Positive for arthralgias and gait problem.  Skin:  Negative for wound.  Neurological:  Positive for weakness. Negative for dizziness and headaches.  Psychiatric/Behavioral:  Negative for confusion, dysphoric mood and sleep disturbance. The patient is not nervous/anxious.    Immunization History  Administered Date(s) Administered   Influenza Split 11/13/2012, 12/13/2012, 11/30/2013, 10/09/2014, 11/24/2018   Influenza, High Dose Seasonal PF 12/01/2013, 10/04/2014, 10/12/2015, 11/06/2016, 11/12/2017   Influenza,inj,Quad PF,6+ Mos 10/24/2010   Influenza-Unspecified 10/24/2010, 11/01/2019, 12/04/2020   Moderna SARS-COV2 Booster Vaccination 07/18/2020   Moderna Sars-Covid-2 Vaccination 02/14/2019, 03/14/2019, 12/26/2019   PFIZER(Purple Top)SARS-COV-2 Vaccination 10/31/2020   Pneumococcal Conjugate-13 09/28/2013   Pneumococcal Polysaccharide-23 03/05/2012   Td 08/27/2000    Tdap 08/27/2000, 10/16/2010   Zoster, Live 01/11/2013, 03/31/2020   Pertinent  Health Maintenance Due  Topic Date Due   INFLUENZA VACCINE  Completed   DEXA SCAN  Completed   Fall Risk 04/29/2020 04/29/2020 04/29/2020 04/30/2020 08/16/2020  Falls in the past year? - - - - 1  Was there an injury with Fall? - - - - 0  Fall Risk Category Calculator - - - - 2  Fall Risk Category - - - - Moderate  Patient Fall Risk Level High fall risk High fall risk High fall risk High fall risk -  Patient at Risk for Falls Due to - - - - History of  fall(s);Impaired balance/gait;Impaired mobility;No Fall Risks  Fall risk Follow up - - - - Falls evaluation completed;Education provided;Falls prevention discussed   Functional Status Survey:    Vitals:   02/18/21 0949  BP: (!) 159/85  Pulse: 90  Resp: 18  Temp: (!) 97 F (36.1 C)  SpO2: 94%  Weight: 190 lb 9.6 oz (86.5 kg)  Height: 5\' 3"  (1.6 m)   Body mass index is 33.76 kg/m. Physical Exam Vitals reviewed.  Constitutional:      General: She is not in acute distress. HENT:     Head: Normocephalic.     Right Ear: There is no impacted cerumen.     Left Ear: There is no impacted cerumen.     Ears:     Comments: Bilateral hearing aids Cardiovascular:     Rate and Rhythm: Normal rate and regular rhythm.     Pulses: Normal pulses.     Heart sounds: Normal heart sounds. No murmur heard. Pulmonary:     Effort: Pulmonary effort is normal. No respiratory distress.     Breath sounds: Normal breath sounds. No wheezing.  Abdominal:     General: Bowel sounds are normal. There is no distension.     Palpations: Abdomen is soft.     Tenderness: There is no abdominal tenderness.  Musculoskeletal:     Right lower leg: No edema.     Left lower leg: No edema.     Comments: Right foot brace  Skin:    General: Skin is warm and dry.     Capillary Refill: Capillary refill takes less than 2 seconds.  Neurological:     General: No focal deficit present.      Mental Status: She is alert and oriented to person, place, and time.     Motor: Weakness present.     Gait: Gait abnormal.     Comments: PWC/walker  Psychiatric:        Mood and Affect: Mood normal.        Behavior: Behavior normal.    Labs reviewed: Recent Labs    04/22/20 0109 04/23/20 0652 04/24/20 0058 04/28/20 1641 04/29/20 0521 04/30/20 0340 05/03/20 0000 05/14/20 0000 06/11/20 0000 11/19/20 0000  NA 126* 128* 127* 128* 127* 130*   < > 132* 135* 138  K 4.1 4.2 4.3 5.2* 4.5 4.6   < > 4.6 4.5 4.6  CL 98 96* 97* 97* 98 100   < > 99 99 105  CO2 22 25 25  20* 22 24   < > 24* 28* 27*  GLUCOSE 111* 142* 109* 160* 119* 97  --   --   --   --   BUN 16 17 23  30* 33* 29*   < > 33* 21 34*  CREATININE 0.83 0.97 0.97 1.16* 1.37* 1.06*   < > 1.0 0.9 1.0  CALCIUM 8.4* 8.6* 8.3* 9.0 8.9 8.7*   < > 9.2 9.6 9.2  MG 1.6* 1.6* 1.6*  --   --   --   --   --   --   --   PHOS 2.8 2.6 3.1  --   --  2.8  --   --   --   --    < > = values in this interval not displayed.   Recent Labs    04/22/20 0109 04/23/20 0652 04/24/20 0058 04/30/20 0340 06/11/20 0000 11/19/20 0000  AST 22 33 23  --  26 16  ALT 16 25 20   --  14 9  ALKPHOS 75 103 77  --  86 56  BILITOT 1.0 0.9 0.7  --   --   --   PROT 6.5 6.9 5.8*  --   --   --   ALBUMIN 2.5* 2.5* 2.2* 2.4* 3.4* 3.5   Recent Labs    04/22/20 0109 04/23/20 3244 04/24/20 0058 04/28/20 1641 04/29/20 0521 11/19/20 0000  WBC 8.4 6.7 7.1 12.2* 10.2 5.4  NEUTROABS 5.6 4.4 4.2  --   --   --   HGB 10.8* 11.9* 10.3* 14.6 12.0 11.3*  HCT 32.5* 34.9* 31.0* 43.5 34.9* 36  MCV 84.9 83.9 84.9 85.1 83.5  --   PLT 322 336 332 441* 348 222   Lab Results  Component Value Date   TSH 2.357 04/06/2020   No results found for: HGBA1C No results found for: CHOL, HDL, LDLCALC, LDLDIRECT, TRIG, CHOLHDL  Significant Diagnostic Results in last 30 days:  No results found.  Assessment/Plan 1. Essential hypertension - followed by cardiology - off medications  at this time  - cont Hydralazine prn for SBP> 170  2. Hyponatremia - Na 138 11/19/2020 - cont sodium tablets  3. Atrial flutter, unspecified type ([place]) - followed by cardiology - off anticoagulation due to falls  4. Gastroesophageal reflux disease, unspecified whether esophagitis present - hgb stable -cont protonix and TUMS prn  5. Cognitive impairment - doing well in AL - cont supportive care  6. Slow transit constipation - LBM 01/09, abdomen soft - cont colace prn  7. Chronic pain of right ankle - stable with foot brace - interested in getting orthotics  8. Primary insomnia - sleeping well - cont melatonin  9. Weight gain - weight increased 12 lbs in 2 months - Boost discontinued - discussed decreased snacking and portion sizes - TSH  10. Stage 3a chronic kidney disease ([place]) - cont to avoid nephrotoxic drugs like NSAIDS and dose adjust medications to be renally excreted - encourage hydration with water  Total time: 32 minutes    Family/ staff Communication: plan discussed with patient and nurse  Labs/tests ordered:  TSH 02/22/2020

## 2021-02-21 LAB — TSH: TSH: 5.19 (ref 0.41–5.90)

## 2021-03-05 ENCOUNTER — Encounter: Payer: Self-pay | Admitting: Nurse Practitioner

## 2021-03-05 ENCOUNTER — Non-Acute Institutional Stay: Payer: Medicare Other | Admitting: Nurse Practitioner

## 2021-03-05 DIAGNOSIS — K5901 Slow transit constipation: Secondary | ICD-10-CM

## 2021-03-05 DIAGNOSIS — N390 Urinary tract infection, site not specified: Secondary | ICD-10-CM

## 2021-03-05 DIAGNOSIS — M810 Age-related osteoporosis without current pathological fracture: Secondary | ICD-10-CM

## 2021-03-05 DIAGNOSIS — C569 Malignant neoplasm of unspecified ovary: Secondary | ICD-10-CM

## 2021-03-05 DIAGNOSIS — K219 Gastro-esophageal reflux disease without esophagitis: Secondary | ICD-10-CM

## 2021-03-05 DIAGNOSIS — B3731 Acute candidiasis of vulva and vagina: Secondary | ICD-10-CM | POA: Diagnosis not present

## 2021-03-05 DIAGNOSIS — R55 Syncope and collapse: Secondary | ICD-10-CM

## 2021-03-05 DIAGNOSIS — I4892 Unspecified atrial flutter: Secondary | ICD-10-CM

## 2021-03-05 DIAGNOSIS — M159 Polyosteoarthritis, unspecified: Secondary | ICD-10-CM

## 2021-03-05 DIAGNOSIS — E039 Hypothyroidism, unspecified: Secondary | ICD-10-CM | POA: Diagnosis not present

## 2021-03-05 DIAGNOSIS — E871 Hypo-osmolality and hyponatremia: Secondary | ICD-10-CM | POA: Diagnosis not present

## 2021-03-05 DIAGNOSIS — I10 Essential (primary) hypertension: Secondary | ICD-10-CM

## 2021-03-05 DIAGNOSIS — R4189 Other symptoms and signs involving cognitive functions and awareness: Secondary | ICD-10-CM | POA: Insufficient documentation

## 2021-03-05 DIAGNOSIS — F5101 Primary insomnia: Secondary | ICD-10-CM

## 2021-03-05 NOTE — Assessment & Plan Note (Signed)
takes Pantoprazole. Hgb 11.3 11/19/20

## 2021-03-05 NOTE — Assessment & Plan Note (Signed)
Hx of ovarian CA s/p TAH/BSO 03/2011, in remission. 

## 2021-03-05 NOTE — Assessment & Plan Note (Signed)
off Ambien.  

## 2021-03-05 NOTE — Assessment & Plan Note (Addendum)
Urogenital and under R+L breast skin folds, apply Nystatin powder bid x 2weeks.

## 2021-03-05 NOTE — Assessment & Plan Note (Signed)
takes Ca, Vit D 

## 2021-03-05 NOTE — Assessment & Plan Note (Signed)
f/u Cardiology, off anticoagulation 2/2 falls.

## 2021-03-05 NOTE — Assessment & Plan Note (Signed)
outpatient cardiac monitor. Orthostasis vs micturition vs dehydratio vs AR of Ambien.

## 2021-03-05 NOTE — Assessment & Plan Note (Signed)
Right ankle, uses ankle brace, takes Tylenol.

## 2021-03-05 NOTE — Assessment & Plan Note (Signed)
takes Levothyroxine, TSH 2.357 04/06/20

## 2021-03-05 NOTE — Assessment & Plan Note (Signed)
on NaCl, Na 138 11/19/20, fluid restriction 1510ml/day

## 2021-03-05 NOTE — Assessment & Plan Note (Signed)
takes prn Colace.

## 2021-03-05 NOTE — Progress Notes (Signed)
Location:  [facility] Room Number: [place] of Service:  [id number] [phone]) Provider:  Marlana Latus, NP    Patient Care Team: Virgie Dad, MD as PCP - General (Internal Medicine) Donato Heinz, MD as PCP - Cardiology (Cardiology) Brien Few, MD as Consulting Physician (Obstetrics and Gynecology) Marti Sleigh, MD as Consulting Physician (Gynecology) Nancy Marus, MD as Consulting Physician (Gynecologic Oncology)  Extended Emergency Contact Information Primary Emergency Contact: [facility] Address: [address]          [place], [place] [address] [name] of [place] Phone: [phone] Mobile Phone: [phone] Relation: Daughter Secondary Emergency Contact: [place] Mobile Phone: [phone] Relation: Son  Code Status:  Full Code Goals of care: Advanced Directive information Advanced Directives 02/18/2021  Does Patient Have a Medical Advance Directive? Yes  Type of Paramedic of [place];Living will  Does patient want to make changes to medical advance directive? No - Patient declined  Copy of [place] in Chart? Yes - validated most recent copy scanned in chart (See row information)  Would patient like information on creating a medical advance directive? -  Pre-existing out of facility DNR order (yellow form or pink MOST form) -     Chief Complaint  Patient presents with   Acute Visit    Possible UTI     HPI:  Pt is a 88 y.o. female seen today for an acute visit for c/o urogenital and under breast skin folds itching and redness. Also c/o burning sensation upon urination and urinary frequency. Denied abd pain, nausea, vomiting, she is afebrile. Urine culture showed E.Coli >100,000c/ml  Hypothyroidism, takes Levothyroxine, TSH 2.357 04/06/20  Hyponatremia, on NaCl, Na 138 11/19/20, fluid restriction 1563ml/day.              Syncope: outpatient cardiac monitor.  Orthostasis vs micturition vs dehydratio vs AR of Ambien.   A flutter, f/u Cardiology, off anticoagulation 2/2 falls.              Hx of ovarian CA s/p TAH/BSO 03/2011, in remission.  Cognitive impairment, [facility] for supportive care.              Constipation takes prn Colace.              HTN, takes prn Hydralazine for Sbp >170, Dbp>90. Bun/creat 34/1.0 11/19/20             OP, takes Ca, Vit D             GERD, takes Pantoprazole. Hgb 11.3 11/19/20             Insomnia, off Ambien.              R ankle pain, uses ankle brace, takes Tylenol.   Past Medical History:  Diagnosis Date   Anemia associated with acute blood loss 04/03/2011   Arthritis    knees    Ascites    Blood transfusion    hx of 4401    Complication of anesthesia    Sodium drops per pt    Cystitis    DIARRHEA, ANTIBIOTIC ASSOCIATED 05/31/2009   Qualifier: Diagnosis of  By: Tommy Medal MD, Roderic Scarce     GERD (gastroesophageal reflux disease)    H/O hiatal hernia    Hyperlipidemia    Hypertension    Hyponatremia    Neutropenia with fever ([place]) 05/18/2011   OSTEOMYELITIS, CHRONIC, LOWER LEG 04/23/2009   Qualifier: Diagnosis of  By:  Johnnye Sima MD, Dellis Filbert     Osteopenia    OSTEOPOROSIS 04/23/2009   Qualifier: Diagnosis of  By: Johnnye Sima MD, Dellis Filbert     Ovarian cancer ([place]) 01/25/2011   Pneumonia    hx of    Postmenopausal atrophic vaginitis    PROSTHETIC JOINT COMPLICATION 9/74/1638   Qualifier: Diagnosis of  By: Tommy Medal MD, Cornel[place]     Renal disorder    Decreased kidney function   Staph infection 2010   after knee replacement   Urinary frequency    Vulvitis    Past Surgical History:  Procedure Laterality Date   ABDOMINAL HYSTERECTOMY  04/01/2011   Procedure: HYSTERECTOMY ABDOMINAL;  Surgeon: Alvino Chapel, MD;  Location: [facility];  Service: Gynecology;  Laterality: N/A;   APPENDECTOMY     JOINT REPLACEMENT     R knee in 2008, 5 operations on L knee   LAPAROTOMY  04/01/2011   Procedure: EXPLORATORY  LAPAROTOMY;  Surgeon: Alvino Chapel, MD;  Location: [facility];  Service: Gynecology;  Laterality: N/A;   OTHER SURGICAL HISTORY     hx of C section 1966   SALPINGOOPHORECTOMY  04/01/2011   Procedure: SALPINGO OOPHERECTOMY;  Surgeon: Alvino Chapel, MD;  Location: [facility];  Service: Gynecology;  Laterality: Bilateral;   TONSILLECTOMY      Allergies  Allergen Reactions   Morphine And Related Anaphylaxis and Other (See Comments)    Given after knee replacement and code blue occurred   Morphine Sulfate Anaphylaxis   Penicillins Hives, Itching and Other (See Comments)    Syncope, also   Nsaids Other (See Comments)    Because of an abnormal kidney test result   Penicillin V Hives   Shellfish Allergy Nausea And Vomiting and Other (See Comments)    Pt has shellfish allergy only.  Has had IV contrast x 2 and did fine.   Shellfish-Derived Products Nausea And Vomiting    Outpatient Encounter Medications as of 03/05/2021  Medication Sig   acetaminophen (TYLENOL) 500 MG tablet Take 1,000 mg by mouth every 8 (eight) hours as needed.   azelastine (ASTELIN) 0.1 % nasal spray Place 1 spray into both nostrils daily as needed for rhinitis or allergies.   B Complex-C-Folic Acid (B-COMPLEX/FOLIC ACID/VITAMIN C PO) Take 1 tablet by mouth daily with breakfast.   calcium carbonate (TUMS EX) 750 MG chewable tablet Chew 750 mg by mouth daily as needed for heartburn.   Cholecalciferol (VITAMIN D3) 50 MCG (2000 UT) TABS Take 2,000 Units by mouth in the morning.   cycloSPORINE (RESTASIS) 0.05 % ophthalmic emulsion Place 1 drop into both eyes 2 (two) times daily.   diclofenac Sodium (VOLTAREN) 1 % GEL Apply 2 g topically in the morning and at bedtime.   docusate sodium (COLACE) 100 MG capsule Take 100 mg by mouth daily as needed for mild constipation.   hydrALAZINE (APRESOLINE) 10 MG tablet Take 10 mg by mouth 2 (two) times daily as needed.   levothyroxine (SYNTHROID) 25 MCG tablet Take 25 mcg by  mouth daily before breakfast.   meclizine (ANTIVERT) 12.5 MG tablet Take 1 tablet (12.5 mg total) by mouth 2 (two) times daily as needed for dizziness.   melatonin 3 MG TABS tablet Take 1 tablet (3 mg total) by mouth at bedtime.   nystatin powder Apply 1 application topically 2 (two) times daily as needed.   pantoprazole (PROTONIX) 40 MG tablet Take 1 tablet (40 mg total) by mouth 2 (two) times daily.   sodium chloride  1 g tablet Take 1 g by mouth 2 (two) times daily.   zinc oxide 20 % ointment Apply 1 application topically as needed (for redness- to be applied to buttocks/peri area and after every incontinent episode).   [DISCONTINUED] Ensure (ENSURE) Take 237 mLs by mouth. 236mLS., oral, Twice A Day, Give Ensure 237MLs/Boost 237MLS Strawberry Liquid oral per family request W/Breakfast and 2PM to increase Nutritional adequacy. Doc.% consumed. Family will provide.   [DISCONTINUED] white petrolatum (VASELINE) GEL Apply 1 application topically daily as needed for lip care.   No facility-administered encounter medications on file as of 03/05/2021.    Review of Systems  Constitutional:  Negative for activity change, appetite change, fatigue and fever.  HENT:  Positive for hearing loss. Negative for congestion and trouble swallowing.   Eyes:  Negative for visual disturbance.  Respiratory:  Negative for cough and shortness of breath.   Cardiovascular:  Negative for leg swelling.  Gastrointestinal:  Negative for abdominal pain and constipation.  Genitourinary:  Positive for dysuria, frequency and urgency.  Musculoskeletal:  Positive for arthralgias, back pain and gait problem.       R ankle pain.   Skin:  Negative for color change.       Itching redness under R+L breast, in urogenital area.   Neurological:  Negative for speech difficulty, weakness and light-headedness.  Psychiatric/Behavioral:  Negative for confusion and sleep disturbance. The patient is not nervous/anxious.    Immunization  History  Administered Date(s) Administered   Influenza Split 11/13/2012, 12/13/2012, 11/30/2013, 10/09/2014, 11/24/2018   Influenza, High Dose Seasonal PF 12/01/2013, 10/04/2014, 10/12/2015, 11/06/2016, 11/12/2017   Influenza,inj,Quad PF,6+ Mos 10/24/2010   Influenza-Unspecified 10/24/2010, 11/01/2019, 12/04/2020   Moderna SARS-COV2 Booster Vaccination 07/18/2020   Moderna Sars-Covid-2 Vaccination 02/14/2019, 03/14/2019, 12/26/2019   PFIZER(Purple Top)SARS-COV-2 Vaccination 10/31/2020   Pfizer Covid-19 Vaccine Bivalent Booster 62yrs & up 10/31/2020   Pneumococcal Conjugate-13 09/28/2013   Pneumococcal Polysaccharide-23 03/05/2012   Td 08/27/2000   Tdap 08/27/2000, 10/16/2010   Zoster, Live 01/11/2013, 03/31/2020   Pertinent  Health Maintenance Due  Topic Date Due   INFLUENZA VACCINE  Completed   DEXA SCAN  Completed   Fall Risk 04/29/2020 04/29/2020 04/29/2020 04/30/2020 08/16/2020  Falls in the past year? - - - - 1  Was there an injury with Fall? - - - - 0  Fall Risk Category Calculator - - - - 2  Fall Risk Category - - - - Moderate  Patient Fall Risk Level High fall risk High fall risk High fall risk High fall risk -  Patient at Risk for Falls Due to - - - - History of fall(s);Impaired balance/gait;Impaired mobility;No Fall Risks  Fall risk Follow up - - - - Falls evaluation completed;Education provided;Falls prevention discussed   Functional Status Survey:    Vitals:   03/05/21 1300  BP: (!) 155/78  Pulse: 80  Resp: 18  Temp: 98.1 F (36.7 C)  SpO2: 98%  Weight: 190 lb 9.6 oz (86.5 kg)  Height: 5\' 3"  (1.6 m)   Body mass index is 33.76 kg/m. Physical Exam Vitals and nursing note reviewed.  Constitutional:      Appearance: Normal appearance.  HENT:     Head: Normocephalic and atraumatic.     Mouth/Throat:     Mouth: Mucous membranes are moist.  Eyes:     Extraocular Movements: Extraocular movements intact.     Conjunctiva/sclera: Conjunctivae normal.     Pupils:  Pupils are equal, round, and reactive to light.  Cardiovascular:  Rate and Rhythm: Normal rate and regular rhythm.     Heart sounds: No murmur heard. Pulmonary:     Effort: Pulmonary effort is normal.     Breath sounds: No rales.  Abdominal:     General: Bowel sounds are normal. There is no distension.     Palpations: Abdomen is soft.     Tenderness: There is no abdominal tenderness. There is no right CVA tenderness, left CVA tenderness, guarding or rebound.     Hernia: A hernia is present.     Comments: Incision hernia  Musculoskeletal:        General: Tenderness present.     Cervical back: Normal range of motion and neck supple.     Right lower leg: No edema.     Left lower leg: No edema.     Comments: Right ankle brace, chronic pain.  Skin:    General: Skin is warm and dry.     Findings: Erythema present.     Comments: Itching redness under R+L breast, in urogenital area.   Neurological:     General: No focal deficit present.     Mental Status: She is alert and oriented to person, place, and time. Mental status is at baseline.     Gait: Gait abnormal.  Psychiatric:        Mood and Affect: Mood normal.        Behavior: Behavior normal.        Thought Content: Thought content normal.        Judgment: Judgment normal.    Labs reviewed: Recent Labs    04/22/20 0109 04/23/20 0652 04/24/20 0058 04/28/20 1641 04/29/20 0521 04/30/20 0340 05/03/20 0000 05/14/20 0000 06/11/20 0000 11/19/20 0000  NA 126* 128* 127* 128* 127* 130*   < > 132* 135* 138  K 4.1 4.2 4.3 5.2* 4.5 4.6   < > 4.6 4.5 4.6  CL 98 96* 97* 97* 98 100   < > 99 99 105  CO2 22 25 25  20* 22 24   < > 24* 28* 27*  GLUCOSE 111* 142* 109* 160* 119* 97  --   --   --   --   BUN 16 17 23  30* 33* 29*   < > 33* 21 34*  CREATININE 0.83 0.97 0.97 1.16* 1.37* 1.06*   < > 1.0 0.9 1.0  CALCIUM 8.4* 8.6* 8.3* 9.0 8.9 8.7*   < > 9.2 9.6 9.2  MG 1.6* 1.6* 1.6*  --   --   --   --   --   --   --   PHOS 2.8 2.6 3.1  --    --  2.8  --   --   --   --    < > = values in this interval not displayed.   Recent Labs    04/22/20 0109 04/23/20 0652 04/24/20 0058 04/30/20 0340 06/11/20 0000 11/19/20 0000  AST 22 33 23  --  26 16  ALT 16 25 20   --  14 9  ALKPHOS 75 103 77  --  86 56  BILITOT 1.0 0.9 0.7  --   --   --   PROT 6.5 6.9 5.8*  --   --   --   ALBUMIN 2.5* 2.5* 2.2* 2.4* 3.4* 3.5   Recent Labs    04/22/20 0109 04/23/20 0652 04/24/20 0058 04/28/20 1641 04/29/20 0521 11/19/20 0000  WBC 8.4 6.7 7.1 12.2* 10.2 5.4  NEUTROABS 5.6 4.4 4.2  --   --   --  HGB 10.8* 11.9* 10.3* 14.6 12.0 11.3*  HCT 32.5* 34.9* 31.0* 43.5 34.9* 36  MCV 84.9 83.9 84.9 85.1 83.5  --   PLT 322 336 332 441* 348 222   Lab Results  Component Value Date   TSH 2.357 04/06/2020   No results found for: HGBA1C No results found for: CHOL, HDL, LDLCALC, LDLDIRECT, TRIG, CHOLHDL  Significant Diagnostic Results in last 30 days:  No results found.  Assessment/Plan Acute lower UTI 02/28/21 Urine culture E. Coli, >100,000c/ml. Nitrofurantoin 100mg  bid x 7 days along with FloraStor bid.   Candidiasis of female genitalia Urogenital and under R+L breast skin folds, apply Nystatin powder bid x 2weeks.   Hypothyroidism takes Levothyroxine, TSH 2.357 04/06/20  Hyponatremia on NaCl, Na 138 11/19/20, fluid restriction 1537ml/day  Syncope outpatient cardiac monitor. Orthostasis vs micturition vs dehydratio vs AR of Ambien.   Atrial flutter ([place]) f/u Cardiology, off anticoagulation 2/2 falls.   Ovarian cancer ([place])  Hx of ovarian CA s/p TAH/BSO 03/2011, in remission.  Cognitive impairment  [facility] for supportive care.   Slow transit constipation takes prn Colace.   Essential hypertension takes prn Hydralazine for Sbp >170, Dbp>90. Bun/creat 34/1.0 11/19/20  Osteoporosis  takes Ca, Vit D  GERD  takes Pantoprazole. Hgb 11.3 11/19/20  Insomnia  off Ambien.   Osteoarthritis Right ankle, uses ankle brace, takes  Tylenol.      Family/ staff Communication: plan of care reviewed with the patient and charge nurse.   Labs/tests ordered:  none  Time spend 40 minutes.

## 2021-03-05 NOTE — Assessment & Plan Note (Addendum)
02/28/21 Urine culture E. Coli, >100,000c/ml. Nitrofurantoin 100mg  bid x 7 days along with FloraStor bid.

## 2021-03-05 NOTE — Assessment & Plan Note (Signed)
takes prn Hydralazine for Sbp >170, Dbp>90. Bun/creat 34/1.0 11/19/20

## 2021-03-05 NOTE — Assessment & Plan Note (Signed)
[facility] for supportive care.  

## 2021-03-11 ENCOUNTER — Encounter: Payer: Self-pay | Admitting: Nurse Practitioner

## 2021-03-15 ENCOUNTER — Non-Acute Institutional Stay: Payer: Medicare Other | Admitting: Orthopedic Surgery

## 2021-03-15 ENCOUNTER — Encounter: Payer: Self-pay | Admitting: Orthopedic Surgery

## 2021-03-15 DIAGNOSIS — E871 Hypo-osmolality and hyponatremia: Secondary | ICD-10-CM | POA: Diagnosis not present

## 2021-03-15 DIAGNOSIS — M25471 Effusion, right ankle: Secondary | ICD-10-CM

## 2021-03-15 DIAGNOSIS — K219 Gastro-esophageal reflux disease without esophagitis: Secondary | ICD-10-CM

## 2021-03-15 DIAGNOSIS — N390 Urinary tract infection, site not specified: Secondary | ICD-10-CM

## 2021-03-15 DIAGNOSIS — I4892 Unspecified atrial flutter: Secondary | ICD-10-CM | POA: Diagnosis not present

## 2021-03-15 DIAGNOSIS — E039 Hypothyroidism, unspecified: Secondary | ICD-10-CM

## 2021-03-15 DIAGNOSIS — R4189 Other symptoms and signs involving cognitive functions and awareness: Secondary | ICD-10-CM

## 2021-03-15 DIAGNOSIS — M25472 Effusion, left ankle: Secondary | ICD-10-CM

## 2021-03-15 DIAGNOSIS — I10 Essential (primary) hypertension: Secondary | ICD-10-CM | POA: Diagnosis not present

## 2021-03-15 NOTE — Progress Notes (Signed)
Location:  [facility] Room Number: 10/A Place of Service:  ALF [phone]) Provider:  Yvonna Alanis, NP  Patient Care Team: Virgie Dad, MD as PCP - General (Internal Medicine) Donato Heinz, MD as PCP - Cardiology (Cardiology) Brien Few, MD as Consulting Physician (Obstetrics and Gynecology) Marti Sleigh, MD as Consulting Physician (Gynecology) Nancy Marus, MD as Consulting Physician (Gynecologic Oncology)  Extended Emergency Contact Information Primary Emergency Contact: [facility] Address: [address]          [place], [place] [address] [name] of [place] Phone: [phone] Mobile Phone: [phone] Relation: Daughter Secondary Emergency Contact: [place] Mobile Phone: [phone] Relation: Son  Code Status: Full code Goals of care: Advanced Directive information Advanced Directives 03/15/2021  Does Patient Have a Medical Advance Directive? Yes  Type of Paramedic of [place];Living will  Does patient want to make changes to medical advance directive? No - Patient declined  Copy of [place] in Chart? Yes - validated most recent copy scanned in chart (See row information)  Would patient like information on creating a medical advance directive? -  Pre-existing out of facility DNR order (yellow form or pink MOST form) -     Chief Complaint  Patient presents with   Acute Visit    Acute visit. Ankle edema.    HPI:  [name] is a 88 y.o. female seen today for an acute visit for ankle edema.   She reports increased ankle and foot edema in the past 2 days. Swelling affects both extremities. She denies pain or recent injury to BLE. Wearing compression stockings and right ankle brace today. She is also sitting in her recliner. Admits to eating salty foods. Denies chest pain, sob, admits to recent weight gain.   HTN- BUN/creat 34/1.0 11/19/2020, not on scheduled medications  at this time due to orthostatic hypotension, hydralazine prn for SBP> 170 Hyponatremia- Na 138 11/19/2020, remains on sodium tablets Atrial flutter- revealed on 30-day monitor 06/04/2020, not on anticoagulation due to falls Hypothyroidism- TSH 5.19 02/21/2021, remains on levothyroxine Recent UTI- 01/19 urine culture with > 100,000 E.coli, symptoms resolved with nitrofurantoin x 7 days GERD- hgb 11.3 11/19/2020, denies increased reflux, remains on Protonix and TUMS prn Cognitive impairment- no recent behavioral outbursts, does ADLs   Past Medical History:  Diagnosis Date   Anemia associated with acute blood loss 04/03/2011   Arthritis    knees    Ascites    Blood transfusion    hx of 1740    Complication of anesthesia    Sodium drops per [name]    Cystitis    DIARRHEA, ANTIBIOTIC ASSOCIATED 05/31/2009   Qualifier: Diagnosis of  By: Tommy Medal MD, Cornelius     GERD (gastroesophageal reflux disease)    H/O hiatal hernia    Hyperlipidemia    Hypertension    Hyponatremia    Neutropenia with fever ([place]) 05/18/2011   OSTEOMYELITIS, CHRONIC, LOWER LEG 04/23/2009   Qualifier: Diagnosis of  By: Johnnye Sima MD, Felecia Shelling    OSTEOPOROSIS 04/23/2009   Qualifier: Diagnosis of  By: Johnnye Sima MD, Jeffrey     Ovarian cancer ([place]) 01/25/2011   Pneumonia    hx of    Postmenopausal atrophic vaginitis    PROSTHETIC JOINT COMPLICATION 09/23/4816   Qualifier: Diagnosis of  By: Tommy Medal MD, Cornelius     Renal disorder    Decreased kidney function   Staph infection 2010   after knee  replacement   Urinary frequency    Vulvitis    Past Surgical History:  Procedure Laterality Date   ABDOMINAL HYSTERECTOMY  04/01/2011   Procedure: HYSTERECTOMY ABDOMINAL;  Surgeon: Alvino Chapel, MD;  Location: [facility];  Service: Gynecology;  Laterality: N/A;   APPENDECTOMY     JOINT REPLACEMENT     R knee in 2008, 5 operations on L knee   LAPAROTOMY  04/01/2011   Procedure: EXPLORATORY LAPAROTOMY;  Surgeon:  Alvino Chapel, MD;  Location: [facility];  Service: Gynecology;  Laterality: N/A;   OTHER SURGICAL HISTORY     hx of C section 1966   SALPINGOOPHORECTOMY  04/01/2011   Procedure: SALPINGO OOPHERECTOMY;  Surgeon: Alvino Chapel, MD;  Location: [facility];  Service: Gynecology;  Laterality: Bilateral;   TONSILLECTOMY      Allergies  Allergen Reactions   Morphine And Related Anaphylaxis and Other (See Comments)    Given after knee replacement and code blue occurred   Morphine Sulfate Anaphylaxis   Penicillins Hives, Itching and Other (See Comments)    Syncope, also   Nsaids Other (See Comments)    Because of an abnormal kidney test result   Penicillin V Hives   Shellfish Allergy Nausea And Vomiting and Other (See Comments)    [name] has shellfish allergy only.  Has had IV contrast x 2 and did fine.   Shellfish-Derived Products Nausea And Vomiting    Outpatient Encounter Medications as of 03/15/2021  Medication Sig   acetaminophen (TYLENOL) 500 MG tablet Take 1,000 mg by mouth every 8 (eight) hours as needed.   azelastine (ASTELIN) 0.1 % nasal spray Place 1 spray into both nostrils daily as needed for rhinitis or allergies.   B Complex-C-Folic Acid (B-COMPLEX/FOLIC ACID/VITAMIN C PO) Take 1 tablet by mouth daily with breakfast.   calcium carbonate (TUMS EX) 750 MG chewable tablet Chew 750 mg by mouth daily as needed for heartburn.   Cholecalciferol (VITAMIN D3) 50 MCG (2000 UT) TABS Take 2,000 Units by mouth in the morning.   cycloSPORINE (RESTASIS) 0.05 % ophthalmic emulsion Place 1 drop into both eyes 2 (two) times daily.   diclofenac Sodium (VOLTAREN) 1 % GEL Apply 2 g topically in the morning and at bedtime.   docusate sodium (COLACE) 100 MG capsule Take 100 mg by mouth daily as needed for mild constipation.   hydrALAZINE (APRESOLINE) 10 MG tablet Take 10 mg by mouth 2 (two) times daily as needed.   levothyroxine (SYNTHROID) 25 MCG tablet Take 25 mcg by mouth daily before  breakfast.   meclizine (ANTIVERT) 12.5 MG tablet Take 1 tablet (12.5 mg total) by mouth 2 (two) times daily as needed for dizziness.   melatonin 3 MG TABS tablet Take 1 tablet (3 mg total) by mouth at bedtime.   nystatin (MYCOSTATIN/NYSTOP) powder Apply 1 application topically 2 (two) times daily as needed.   pantoprazole (PROTONIX) 40 MG tablet Take 1 tablet (40 mg total) by mouth 2 (two) times daily.   sodium chloride 1 g tablet Take 1 g by mouth 2 (two) times daily.   zinc oxide 20 % ointment Apply 1 application topically as needed (for redness- to be applied to buttocks/peri area and after every incontinent episode).   No facility-administered encounter medications on file as of 03/15/2021.    Review of Systems  Constitutional:  Negative for activity change, appetite change, chills, fatigue and fever.  HENT: Negative.    Eyes: Negative.   Respiratory:  Negative for cough,  shortness of breath and wheezing.   Cardiovascular:  Positive for leg swelling. Negative for chest pain and palpitations.  Gastrointestinal:  Positive for constipation.  Genitourinary:  Negative for dysuria, frequency and hematuria.  Musculoskeletal:  Positive for arthralgias and gait problem.  Skin: Negative.   Neurological:  Negative for dizziness and headaches.  Psychiatric/Behavioral:  Positive for confusion. Negative for dysphoric mood and sleep disturbance. The patient is not nervous/anxious.    Immunization History  Administered Date(s) Administered   Influenza Split 11/13/2012, 12/13/2012, 11/30/2013, 10/09/2014, 11/24/2018   Influenza, High Dose Seasonal PF 12/01/2013, 10/04/2014, 10/12/2015, 11/06/2016, 11/12/2017   Influenza,inj,Quad PF,6+ Mos 10/24/2010   Influenza-Unspecified 10/24/2010, 11/01/2019, 12/04/2020   Moderna SARS-COV2 Booster Vaccination 07/18/2020   Moderna Sars-Covid-2 Vaccination 02/14/2019, 03/14/2019, 12/26/2019   PFIZER(Purple Top)SARS-COV-2 Vaccination 10/31/2020   Pfizer Covid-19  Vaccine Bivalent Booster 67yrs & up 10/31/2020   Pneumococcal Conjugate-13 09/28/2013   Pneumococcal Polysaccharide-23 03/05/2012   Td 08/27/2000   Tdap 08/27/2000, 10/16/2010   Zoster, Live 01/11/2013, 03/31/2020   Pertinent  Health Maintenance Due  Topic Date Due   INFLUENZA VACCINE  Completed   DEXA SCAN  Completed   Fall Risk 04/29/2020 04/29/2020 04/29/2020 04/30/2020 08/16/2020  Falls in the past year? - - - - 1  Was there an injury with Fall? - - - - 0  Fall Risk Category Calculator - - - - 2  Fall Risk Category - - - - Moderate  Patient Fall Risk Level High fall risk High fall risk High fall risk High fall risk -  Patient at Risk for Falls Due to - - - - History of fall(s);Impaired balance/gait;Impaired mobility;No Fall Risks  Fall risk Follow up - - - - Falls evaluation completed;Education provided;Falls prevention discussed   Functional Status Survey:    Vitals:   03/15/21 1427  BP: (!) 147/90  Pulse: 85  Resp: 18  Temp: (!) 97.4 F (36.3 C)  SpO2: 95%  Weight: 190 lb 9.6 oz (86.5 kg)  Height: 5\' 3"  (1.6 m)   Body mass index is 33.76 kg/m. Physical Exam Vitals reviewed.  Constitutional:      General: She is not in acute distress. HENT:     Head: Normocephalic.  Eyes:     General:        Right eye: No discharge.        Left eye: No discharge.  Cardiovascular:     Rate and Rhythm: Normal rate and regular rhythm.     Pulses: Normal pulses.     Heart sounds: Normal heart sounds. No murmur heard. Pulmonary:     Effort: Pulmonary effort is normal. No respiratory distress.     Breath sounds: Normal breath sounds. No wheezing or rales.  Abdominal:     General: Bowel sounds are normal. There is no distension.     Palpations: Abdomen is soft.     Tenderness: There is no abdominal tenderness.  Musculoskeletal:     Cervical back: Neck supple.     Right lower leg: Edema present.     Left lower leg: Edema present.     Comments: +1 pitting  Skin:    General: Skin  is warm and dry.     Capillary Refill: Capillary refill takes less than 2 seconds.  Neurological:     General: No focal deficit present.     Mental Status: She is alert and oriented to person, place, and time.     Motor: Weakness present.     Gait: Gait  abnormal.     Comments: walker  Psychiatric:        Mood and Affect: Mood normal.        Behavior: Behavior normal.    Labs reviewed: Recent Labs    04/22/20 0109 04/23/20 0652 04/24/20 0058 04/28/20 1641 04/29/20 0521 04/30/20 0340 05/03/20 0000 05/14/20 0000 06/11/20 0000 11/19/20 0000  NA 126* 128* 127* 128* 127* 130*   < > 132* 135* 138  K 4.1 4.2 4.3 5.2* 4.5 4.6   < > 4.6 4.5 4.6  CL 98 96* 97* 97* 98 100   < > 99 99 105  CO2 22 25 25  20* 22 24   < > 24* 28* 27*  GLUCOSE 111* 142* 109* 160* 119* 97  --   --   --   --   BUN 16 17 23  30* 33* 29*   < > 33* 21 34*  CREATININE 0.83 0.97 0.97 1.16* 1.37* 1.06*   < > 1.0 0.9 1.0  CALCIUM 8.4* 8.6* 8.3* 9.0 8.9 8.7*   < > 9.2 9.6 9.2  MG 1.6* 1.6* 1.6*  --   --   --   --   --   --   --   PHOS 2.8 2.6 3.1  --   --  2.8  --   --   --   --    < > = values in this interval not displayed.   Recent Labs    04/22/20 0109 04/23/20 0652 04/24/20 0058 04/30/20 0340 06/11/20 0000 11/19/20 0000  AST 22 33 23  --  26 16  ALT 16 25 20   --  14 9  ALKPHOS 75 103 77  --  86 56  BILITOT 1.0 0.9 0.7  --   --   --   PROT 6.5 6.9 5.8*  --   --   --   ALBUMIN 2.5* 2.5* 2.2* 2.4* 3.4* 3.5   Recent Labs    04/22/20 0109 04/23/20 8185 04/24/20 0058 04/28/20 1641 04/29/20 0521 11/19/20 0000  WBC 8.4 6.7 7.1 12.2* 10.2 5.4  NEUTROABS 5.6 4.4 4.2  --   --   --   HGB 10.8* 11.9* 10.3* 14.6 12.0 11.3*  HCT 32.5* 34.9* 31.0* 43.5 34.9* 36  MCV 84.9 83.9 84.9 85.1 83.5  --   PLT 322 336 332 441* 348 222   Lab Results  Component Value Date   TSH 2.357 04/06/2020   No results found for: HGBA1C No results found for: CHOL, HDL, LDLCALC, LDLDIRECT, TRIG, CHOLHDL  Significant  Diagnostic Results in last 30 days:  No results found.  Assessment/Plan 1. Ankle edema, bilateral - +1 pitting edema, lung sounds clear - recommend reducing sodium in diet - cont compression stockings and leg elevation - start furosemide 20 mg po daily x 2 days - start KCL 10 meq po daily x 2 days  2. Essential hypertension - controlled - on sodium tablets - cont hydralazine prn for SBP> 170  3. Hyponatremia - Na 138 11/19/2020 - cont sodium tablets  4. Atrial flutter, unspecified type ([place]) - HR controlled - off anticoagulation due to falls  5. Acquired hypothyroidism - cont levothyroxine - TSH scheduled 02/16  6. Acute lower UTI - 01/19 urine culture E.coli > 100,000 - resolved with nitrofurantoin x 7 days  7. Gastroesophageal reflux disease, unspecified whether esophagitis present - hgb stable - cont Protonix  8. Cognitive impairment - doing well in AL - no recent behavioral outbursts  Family/ staff Communication: plan discussed with patient and nurse  Labs/tests ordered:  none

## 2021-03-28 LAB — TSH: TSH: 3.5 (ref 0.41–5.90)

## 2021-04-25 ENCOUNTER — Ambulatory Visit: Payer: Medicare Other | Admitting: Cardiology

## 2021-05-06 ENCOUNTER — Non-Acute Institutional Stay: Payer: Medicare Other | Admitting: Orthopedic Surgery

## 2021-05-06 ENCOUNTER — Encounter: Payer: Self-pay | Admitting: Orthopedic Surgery

## 2021-05-06 DIAGNOSIS — E871 Hypo-osmolality and hyponatremia: Secondary | ICD-10-CM | POA: Diagnosis not present

## 2021-05-06 DIAGNOSIS — E669 Obesity, unspecified: Secondary | ICD-10-CM

## 2021-05-06 DIAGNOSIS — I10 Essential (primary) hypertension: Secondary | ICD-10-CM

## 2021-05-06 DIAGNOSIS — K219 Gastro-esophageal reflux disease without esophagitis: Secondary | ICD-10-CM

## 2021-05-06 DIAGNOSIS — I4892 Unspecified atrial flutter: Secondary | ICD-10-CM | POA: Diagnosis not present

## 2021-05-06 DIAGNOSIS — J302 Other seasonal allergic rhinitis: Secondary | ICD-10-CM | POA: Diagnosis not present

## 2021-05-06 DIAGNOSIS — E039 Hypothyroidism, unspecified: Secondary | ICD-10-CM

## 2021-05-06 NOTE — Progress Notes (Signed)
?Location:  [facility] Room Number: May 18, 1929 ?Place of Service:  ALF (13) ?Provider: Yvonna Alanis, NP ? ?Patient Care Team: ?Virgie Dad, MD as PCP - General (Internal Medicine) ?Donato Heinz, MD as PCP - Cardiology (Cardiology) ?Brien Few, MD as Consulting Physician (Obstetrics and Gynecology) ?Marti Sleigh, MD as Consulting Physician (Gynecology) ?Nancy Marus, MD as Consulting Physician (Gynecologic Oncology) ? ?Extended Emergency Contact Information ?Primary Emergency Contact: Elamin,Julie ?Address: [address]         [place],  [address] [place] ?Home Phone: [phone] ?Mobile Phone: [phone] ?Relation: Daughter ?Secondary Emergency Contact: Lonfmire,David ?Mobile Phone: [phone] ?Relation: Son ? ?Code Status:  Full code ?Goals of care: Advanced Directive information ? ?  05/06/2021  ?  9:40 AM  ?Advanced Directives  ?Does Patient Have a Medical Advance Directive? Yes  ?Type of Paramedic of [place];Living will  ?Does patient want to make changes to medical advance directive? No - Patient declined  ?Copy of [place] in Chart? Yes - validated most recent copy scanned in chart (See row information)  ? ? ? ?Chief Complaint  ?Patient presents with  ? Acute Visit  ?  Sore throat  ? ? ?HPI:  ?Pt is a 88 y.o. female seen today for an acute visit for sore throat.  ? ?Symptoms of nasal congestion, dry cough, and sore throat began 03/25. Nasal drainage clear. Covid test negative per nursing. She was given cepacol lozenges and Claritin yesterday and reports improved symptoms. Afebrile, denies malaise, body aches or sob. Admits to having seasonal allergies in the past.  ? ?HTN- BUN/creat 34/1.0 11/19/2020, not on scheduled medications at this time due to orthostatic hypotension, hydralazine prn for SBP> 170 ?Hyponatremia- Na 138 11/19/2020, remains on sodium tablets ?Atrial flutter-  revealed on 30-day monitor 06/04/2020, not on anticoagulation due to falls ?Hypothyroidism- TSH 3.50 03/28/2021, remains on levothyroxine ?GERD- hgb 11.3 11/19/2020, denies increased reflux, remains on Protonix and TUMS prn ?Obesity/weight gain- see weights below ? ?Recent weight trends: ? 03/02- 193.4 lbs ? 02/06- 189 lbs ? 01/02- 190 lbs ? 12/14- 182.2 lbs ? ? ? ?Past Medical History:  ?Diagnosis Date  ? Anemia associated with acute blood loss 04/03/2011  ? Arthritis   ? knees   ? Ascites   ? Blood transfusion   ? hx of 2011   ? Complication of anesthesia   ? Sodium drops per pt   ? Cystitis   ? DIARRHEA, ANTIBIOTIC ASSOCIATED 05/31/2009  ? Qualifier: Diagnosis of  By: Tommy Medal MD, Roderic Scarce    ? GERD (gastroesophageal reflux disease)   ? H/O hiatal hernia   ? Hyperlipidemia   ? Hypertension   ? Hyponatremia   ? Neutropenia with fever ([place]) 05/18/2011  ? OSTEOMYELITIS, CHRONIC, LOWER LEG 04/23/2009  ? Qualifier: Diagnosis of  By: Johnnye Sima MD, Dellis Filbert    ? Osteopenia   ? OSTEOPOROSIS 04/23/2009  ? Qualifier: Diagnosis of  By: Johnnye Sima MD, Dellis Filbert    ? Ovarian cancer ([place]) 01/25/2011  ? Pneumonia   ? hx of   ? Postmenopausal atrophic vaginitis   ? PROSTHETIC JOINT COMPLICATION 06/13/5463  ? Qualifier: Diagnosis of  By: Tommy Medal MD, Roderic Scarce    ? Renal disorder   ? Decreased kidney function  ? Staph infection 2010  ? after knee replacement  ? Urinary frequency   ? Vulvitis   ? ?Past Surgical History:  ?Procedure Laterality Date  ? ABDOMINAL HYSTERECTOMY  04/01/2011  ? Procedure: HYSTERECTOMY ABDOMINAL;  Surgeon: Alvino Chapel, MD;  Location: [facility];  Service: Gynecology;  Laterality: N/A;  ? APPENDECTOMY    ? JOINT REPLACEMENT    ? R knee in 2008, 5 operations on L knee  ? LAPAROTOMY  04/01/2011  ? Procedure: EXPLORATORY LAPAROTOMY;  Surgeon: Alvino Chapel, MD;  Location: [facility];  Service: Gynecology;  Laterality: N/A;  ? OTHER SURGICAL HISTORY    ? hx of C section 1966  ? SALPINGOOPHORECTOMY  04/01/2011  ?  Procedure: SALPINGO OOPHERECTOMY;  Surgeon: Alvino Chapel, MD;  [facility]: [facility];  Service: Gynecology;  Laterality: Bilateral;  ? TONSILLECTOMY    ? ? ?Allergies  ?Allergen Reactions  ? Morphine And Related Anaphylaxis and Other (See Comments)  ?  Given after knee replacement and code blue occurred  ? Morphine Sulfate Anaphylaxis  ? Penicillins Hives, Itching and Other (See Comments)  ?  Syncope, also  ? Nsaids Other (See Comments)  ?  Because of an abnormal kidney test result  ? Penicillin V Hives  ? Shellfish Allergy Nausea And Vomiting and Other (See Comments)  ?  Pt has shellfish allergy only.  Has had IV contrast x 2 and did fine.  ? Shellfish-Derived Products Nausea And Vomiting  ? ? ?Outpatient Encounter Medications as of 05/06/2021  ?Medication Sig  ? acetaminophen (TYLENOL) 500 MG tablet Take 1,000 mg by mouth every 8 (eight) hours as needed.  ? azelastine (ASTELIN) 0.1 % nasal spray Place 1 spray into both nostrils daily as needed for rhinitis or allergies.  ? B Complex-C-Folic Acid (B-COMPLEX/FOLIC ACID/VITAMIN C PO) Take 1 tablet by mouth daily with breakfast.  ? calcium carbonate (TUMS EX) 750 MG chewable tablet Chew 750 mg by mouth daily as needed for heartburn.  ? Cholecalciferol (VITAMIN D3) 50 MCG (2000 UT) TABS Take 2,000 Units by mouth in the morning.  ? cycloSPORINE (RESTASIS) 0.05 % ophthalmic emulsion Place 1 drop into both eyes 2 (two) times daily.  ? dextromethorphan-guaiFENesin (ROBAFEN DM CGH/CHEST CONGEST) 10-100 MG/5ML liquid Take 10 mLs by mouth every 6 (six) hours as needed for cough.  ? diclofenac Sodium (VOLTAREN) 1 % GEL Apply 2 g topically in the morning and at bedtime.  ? docusate sodium (COLACE) 100 MG capsule Take 100 mg by mouth daily as needed for mild constipation.  ? hydrALAZINE (APRESOLINE) 10 MG tablet Take 10 mg by mouth 2 (two) times daily as needed.  ? levothyroxine (SYNTHROID) 25 MCG tablet Take 25 mcg by mouth daily before breakfast.  ? meclizine (ANTIVERT)  12.5 MG tablet Take 1 tablet (12.5 mg total) by mouth 2 (two) times daily as needed for dizziness.  ? melatonin 3 MG TABS tablet Take 1 tablet (3 mg total) by mouth at bedtime.  ? nystatin (MYCOSTATIN/NYSTOP) powder Apply 1 application topically 2 (two) times daily as needed.  ? pantoprazole (PROTONIX) 40 MG tablet Take 1 tablet (40 mg total) by mouth 2 (two) times daily.  ? sodium chloride 1 g tablet Take 1 g by mouth 2 (two) times daily.  ? zinc oxide 20 % ointment Apply 1 application topically as needed (for redness- to be applied to buttocks/peri area and after every incontinent episode).  ? ?No facility-administered encounter medications on file as of 05/06/2021.  ? ? ?Review of Systems  ?Constitutional:  Negative for activity change, appetite change, chills, fatigue and fever.  ?HENT:  Positive for congestion, sneezing and sore throat. Negative for ear discharge, ear pain,  sinus pressure, sinus pain, trouble swallowing and voice change.   ?Eyes:  Negative for visual disturbance.  ?Respiratory:  Positive for cough. Negative for shortness of breath and wheezing.   ?Cardiovascular:  Negative for chest pain and leg swelling.  ?Gastrointestinal:  Negative for nausea and vomiting.  ?Genitourinary:  Negative for dysuria.  ?Musculoskeletal:  Positive for gait problem.  ?Skin:  Negative for wound.  ?Neurological:  Positive for weakness. Negative for dizziness and headaches.  ?Psychiatric/Behavioral:  Negative for dysphoric mood and sleep disturbance. The patient is not nervous/anxious.   ? ?Immunization History  ?Administered Date(s) Administered  ? Influenza Split 11/13/2012, 12/13/2012, 11/30/2013, 10/09/2014, 11/24/2018  ? Influenza, High Dose Seasonal PF 12/01/2013, 10/04/2014, 10/12/2015, 11/06/2016, 11/12/2017  ? Influenza,inj,Quad PF,6+ Mos 10/24/2010  ? Influenza-Unspecified 10/24/2010, 11/01/2019, 12/04/2020  ? Moderna SARS-COV2 Booster Vaccination 07/18/2020  ? Moderna Sars-Covid-2 Vaccination 02/14/2019,  03/14/2019, 12/26/2019  ? PFIZER(Purple Top)SARS-COV-2 Vaccination 10/31/2020  ? Pension scheme manager 34yr & up 10/31/2020  ? Pneumococcal Conjugate-13 09/28/2013  ? Pneumococcal Polysacchari

## 2021-05-23 ENCOUNTER — Non-Acute Institutional Stay: Payer: Medicare Other | Admitting: Internal Medicine

## 2021-05-23 ENCOUNTER — Encounter: Payer: Self-pay | Admitting: Internal Medicine

## 2021-05-23 DIAGNOSIS — M25471 Effusion, right ankle: Secondary | ICD-10-CM

## 2021-05-23 DIAGNOSIS — K219 Gastro-esophageal reflux disease without esophagitis: Secondary | ICD-10-CM | POA: Diagnosis not present

## 2021-05-23 DIAGNOSIS — E871 Hypo-osmolality and hyponatremia: Secondary | ICD-10-CM

## 2021-05-23 DIAGNOSIS — E039 Hypothyroidism, unspecified: Secondary | ICD-10-CM | POA: Diagnosis not present

## 2021-05-23 DIAGNOSIS — I4892 Unspecified atrial flutter: Secondary | ICD-10-CM

## 2021-05-23 DIAGNOSIS — R4189 Other symptoms and signs involving cognitive functions and awareness: Secondary | ICD-10-CM

## 2021-05-23 DIAGNOSIS — M25472 Effusion, left ankle: Secondary | ICD-10-CM

## 2021-05-23 NOTE — Progress Notes (Signed)
?Location:   [facility] Room Number: 10 ?Place of Service:  ALF (13) ?Provider:  Veleta Miners MD ? ?Virgie Dad, MD ? ?Patient Care Team: ?Virgie Dad, MD as PCP - General (Internal Medicine) ?Donato Heinz, MD as PCP - Cardiology (Cardiology) ?Brien Few, MD as Consulting Physician (Obstetrics and Gynecology) ?Marti Sleigh, MD as Consulting Physician (Gynecology) ?Nancy Marus, MD as Consulting Physician (Gynecologic Oncology) ? ?Extended Emergency Contact Information ?Primary Emergency Contact: Vossler,Julie ?Address: [address]         [place], [place] [address] [place] ?Home Phone: [phone] ?Mobile Phone: [phone] ?Relation: Daughter ?Secondary Emergency Contact: Lonfmire,David ?Mobile Phone: [phone] ?Relation: Son ? ?Code Status:  Full Code ?Goals of care: Advanced Directive information ? ?  05/23/2021  ? 10:40 AM  ?Advanced Directives  ?Does Patient Have a Medical Advance Directive? Yes  ?Type of Paramedic of [place];Living will  ?Does patient want to make changes to medical advance directive? No - Patient declined  ?Copy of [place] in Chart? Yes - validated most recent copy scanned in chart (See row information)  ? ? ? ?Chief Complaint  ?Patient presents with  ? Medical Management of Chronic Issues  ? Quality Metric Gaps  ?  Verified Matrix and [facility] patient is due for Shingrix and TDAP  ? ? ?HPI:  ?Pt is a 88 y.o. female seen today for medical management of chronic diseases.   ? ?Patient has h/o Hypertension,Hyponatremia, Syncope, Cognitive impairment, HLD, ?Atrial Flutter not on Anticoagulation due to Falls ?  ?Lives in [place] ? She is stable. No new Nursing issues. No Behavior issues ?Her weight is stable ?Walks with her walker ?No Falls ?Wt Readings from Last 3 Encounters:  ?05/23/21 192 lb (87.1 kg)  ?05/06/21 193 lb 6.4 oz (87.7 kg)  ?03/15/21 190 lb 9.6 oz (86.5 kg)   ? ?Past Medical History:  ?Diagnosis Date  ? Anemia associated with acute blood loss 04/03/2011  ? Arthritis   ? knees   ? Ascites   ? Blood transfusion   ? hx of 2011   ? Complication of anesthesia   ? Sodium drops per pt   ? Cystitis   ? DIARRHEA, ANTIBIOTIC ASSOCIATED 05/31/2009  ? Qualifier: Diagnosis of  By: Tommy Medal MD, Roderic Scarce    ? GERD (gastroesophageal reflux disease)   ? H/O hiatal hernia   ? Hyperlipidemia   ? Hypertension   ? Hyponatremia   ? Neutropenia with fever ([place]) 05/18/2011  ? OSTEOMYELITIS, CHRONIC, LOWER LEG 04/23/2009  ? Qualifier: Diagnosis of  By: Johnnye Sima MD, Dellis Filbert    ? Osteopenia   ? OSTEOPOROSIS 04/23/2009  ? Qualifier: Diagnosis of  By: Johnnye Sima MD, Dellis Filbert    ? Ovarian cancer ([place]) 01/25/2011  ? Pneumonia   ? hx of   ? Postmenopausal atrophic vaginitis   ? PROSTHETIC JOINT COMPLICATION 9/62/9528  ? Qualifier: Diagnosis of  By: Tommy Medal MD, Roderic Scarce    ? Renal disorder   ? Decreased kidney function  ? Staph infection 2010  ? after knee replacement  ? Urinary frequency   ? Vulvitis   ? ?Past Surgical History:  ?Procedure Laterality Date  ? ABDOMINAL HYSTERECTOMY  04/01/2011  ? Procedure: HYSTERECTOMY ABDOMINAL;  Surgeon: Alvino Chapel, MD;  Location: [facility];  Service: Gynecology;  Laterality: N/A;  ? APPENDECTOMY    ? JOINT REPLACEMENT    ? R knee in 2008, 5 operations on L  knee  ? LAPAROTOMY  04/01/2011  ? Procedure: EXPLORATORY LAPAROTOMY;  Surgeon: Alvino Chapel, MD;  Location: [facility];  Service: Gynecology;  Laterality: N/A;  ? OTHER SURGICAL HISTORY    ? hx of C section 1966  ? SALPINGOOPHORECTOMY  04/01/2011  ? Procedure: SALPINGO OOPHERECTOMY;  Surgeon: Alvino Chapel, MD;  Location: [facility];  Service: Gynecology;  Laterality: Bilateral;  ? TONSILLECTOMY    ? ? ?Allergies  ?Allergen Reactions  ? Morphine And Related Anaphylaxis and Other (See Comments)  ?  Given after knee replacement and code blue occurred  ? Morphine Sulfate Anaphylaxis  ? Penicillins Hives,  Itching and Other (See Comments)  ?  Syncope, also  ? Nsaids Other (See Comments)  ?  Because of an abnormal kidney test result  ? Penicillin V Hives  ? Shellfish Allergy Nausea And Vomiting and Other (See Comments)  ?  Pt has shellfish allergy only.  Has had IV contrast x 2 and did fine.  ? Shellfish-Derived Products Nausea And Vomiting  ? ? ?Allergies as of 05/23/2021   ? ?   Reactions  ? Morphine And Related Anaphylaxis, Other (See Comments)  ? Given after knee replacement and code blue occurred  ? Morphine Sulfate Anaphylaxis  ? Penicillins Hives, Itching, Other (See Comments)  ? Syncope, also  ? Nsaids Other (See Comments)  ? Because of an abnormal kidney test result  ? Penicillin V Hives  ? Shellfish Allergy Nausea And Vomiting, Other (See Comments)  ? Pt has shellfish allergy only.  Has had IV contrast x 2 and did fine.  ? Shellfish-derived Products Nausea And Vomiting  ? ?  ? ?  ?Medication List  ?  ? ?  ? Accurate as of May 23, 2021 10:41 AM. If you have any questions, ask your nurse or doctor.  ?  ?  ? ?  ? ?STOP taking these medications   ? ?Robafen DM Cgh/Chest Congest 10-100 MG/5ML liquid ?Generic drug: dextromethorphan-guaiFENesin ?Stopped by: Virgie Dad, MD ?  ? ?  ? ?TAKE these medications   ? ?acetaminophen 500 MG tablet ?Commonly known as: TYLENOL ?Take 1,000 mg by mouth every 8 (eight) hours as needed. ?  ?azelastine 0.1 % nasal spray ?Commonly known as: ASTELIN ?Place 1 spray into both nostrils daily as needed for rhinitis or allergies. ?  ?B-COMPLEX/FOLIC ACID/VITAMIN C PO ?Take 1 tablet by mouth daily with breakfast. ?  ?calcium carbonate 750 MG chewable tablet ?Commonly known as: TUMS EX ?Chew 750 mg by mouth daily as needed for heartburn. ?  ?cycloSPORINE 0.05 % ophthalmic emulsion ?Commonly known as: RESTASIS ?Place 1 drop into both eyes 2 (two) times daily. ?  ?diclofenac Sodium 1 % Gel ?Commonly known as: VOLTAREN ?Apply 2 g topically in the morning and at bedtime. ?  ?docusate sodium  100 MG capsule ?Commonly known as: COLACE ?Take 100 mg by mouth daily as needed for mild constipation. ?  ?fluticasone 50 MCG/ACT nasal spray ?Commonly known as: FLONASE ?Place 1 spray into both nostrils daily. ?  ?hydrALAZINE 10 MG tablet ?Commonly known as: APRESOLINE ?Take 10 mg by mouth 2 (two) times daily as needed. ?  ?levothyroxine 25 MCG tablet ?Commonly known as: SYNTHROID ?Take 25 mcg by mouth daily before breakfast. ?  ?loratadine 10 MG tablet ?Commonly known as: CLARITIN ?Take 10 mg by mouth daily. ?  ?meclizine 12.5 MG tablet ?Commonly known as: ANTIVERT ?Take 1 tablet (12.5 mg total) by mouth 2 (two) times daily as  needed for dizziness. ?  ?melatonin 3 MG Tabs tablet ?Take 1 tablet (3 mg total) by mouth at bedtime. ?  ?nystatin powder ?Commonly known as: MYCOSTATIN/NYSTOP ?Apply 1 application topically 2 (two) times daily as needed. ?  ?pantoprazole 40 MG tablet ?Commonly known as: PROTONIX ?Take 1 tablet (40 mg total) by mouth 2 (two) times daily. ?  ?sodium chloride 1 g tablet ?Take 1 g by mouth 2 (two) times daily. ?  ?Vitamin D3 50 MCG (2000 UT) Tabs ?Take 2,000 Units by mouth in the morning. ?  ?zinc oxide 20 % ointment ?Apply 1 application topically as needed (for redness- to be applied to buttocks/peri area and after every incontinent episode). ?  ? ?  ? ? ?Review of Systems  ?Constitutional:  Negative for activity change and appetite change.  ?HENT: Negative.    ?Respiratory:  Negative for cough and shortness of breath.   ?Cardiovascular:  Negative for leg swelling.  ?Gastrointestinal:  Positive for constipation.  ?Genitourinary: Negative.   ?Musculoskeletal:  Negative for arthralgias, gait problem and myalgias.  ?Skin: Negative.   ?Neurological:  Negative for dizziness and weakness.  ?Psychiatric/Behavioral:  Negative for confusion, dysphoric mood and sleep disturbance.   ? ?Immunization History  ?Administered Date(s) Administered  ? Influenza Split 11/13/2012, 12/13/2012, 11/30/2013,  10/09/2014, 11/24/2018  ? Influenza, High Dose Seasonal PF 12/01/2013, 10/04/2014, 10/12/2015, 11/06/2016, 11/12/2017  ? Influenza,inj,Quad PF,6+ Mos 10/24/2010  ? Influenza-Unspecified 10/24/2010, 11/01/2019, 10

## 2021-05-27 LAB — HEPATIC FUNCTION PANEL
ALT: 18 U/L (ref 7–35)
AST: 12 — AB (ref 13–35)
Alkaline Phosphatase: 63 (ref 25–125)
Bilirubin, Total: 0.4

## 2021-05-27 LAB — CBC AND DIFFERENTIAL
HCT: 36 (ref 36–46)
Hemoglobin: 11.6 — AB (ref 12.0–16.0)
Neutrophils Absolute: 3097
Platelets: 236 10*3/uL (ref 150–400)
WBC: 5.6

## 2021-05-27 LAB — BASIC METABOLIC PANEL
BUN: 26 — AB (ref 4–21)
CO2: 23 — AB (ref 13–22)
Chloride: 105 (ref 99–108)
Creatinine: 1.2 — AB (ref 0.5–1.1)
Potassium: 4.3 mEq/L (ref 3.5–5.1)
Sodium: 22 — AB (ref 137–147)

## 2021-05-27 LAB — COMPREHENSIVE METABOLIC PANEL
Albumin: 3.4 — AB (ref 3.5–5.0)
Calcium: 9 (ref 8.7–10.7)
Globulin: 3.4
eGFR: 44

## 2021-05-27 LAB — CBC: RBC: 4.36 (ref 3.87–5.11)

## 2021-07-30 ENCOUNTER — Ambulatory Visit: Payer: Medicare Other | Admitting: Cardiology

## 2021-08-16 ENCOUNTER — Non-Acute Institutional Stay: Payer: Medicare Other | Admitting: Orthopedic Surgery

## 2021-08-16 ENCOUNTER — Encounter: Payer: Self-pay | Admitting: Orthopedic Surgery

## 2021-08-16 DIAGNOSIS — M25471 Effusion, right ankle: Secondary | ICD-10-CM

## 2021-08-16 DIAGNOSIS — F5101 Primary insomnia: Secondary | ICD-10-CM

## 2021-08-16 DIAGNOSIS — E039 Hypothyroidism, unspecified: Secondary | ICD-10-CM

## 2021-08-16 DIAGNOSIS — R4189 Other symptoms and signs involving cognitive functions and awareness: Secondary | ICD-10-CM

## 2021-08-16 DIAGNOSIS — I10 Essential (primary) hypertension: Secondary | ICD-10-CM

## 2021-08-16 DIAGNOSIS — I4892 Unspecified atrial flutter: Secondary | ICD-10-CM

## 2021-08-16 DIAGNOSIS — E871 Hypo-osmolality and hyponatremia: Secondary | ICD-10-CM | POA: Diagnosis not present

## 2021-08-16 DIAGNOSIS — M25472 Effusion, left ankle: Secondary | ICD-10-CM

## 2021-08-16 DIAGNOSIS — E669 Obesity, unspecified: Secondary | ICD-10-CM

## 2021-08-16 DIAGNOSIS — K219 Gastro-esophageal reflux disease without esophagitis: Secondary | ICD-10-CM

## 2021-08-16 NOTE — Progress Notes (Signed)
Location:  [facility] Room Number: AL/10/A Place of Service:  ALF (13) Provider:  Yvonna Alanis NP  Virgie Dad, MD  Patient Care Team: Virgie Dad, MD as PCP - General (Internal Medicine) Donato Heinz, MD as PCP - Cardiology (Cardiology) Brien Few, MD as Consulting Physician (Obstetrics and Gynecology) Marti Sleigh, MD as Consulting Physician (Gynecology) Nancy Marus, MD as Consulting Physician (Gynecologic Oncology)  Extended Emergency Contact Information Primary Emergency Contact: [facility] Address: [address]          [place], [place] [address] [name] Phone: [phone] Mobile Phone: [phone] Relation: Daughter Secondary Emergency Contact: [place] Mobile Phone: [phone] Relation: Son  Code Status:  FULL Goals of care: Advanced Directive information    08/16/2021    9:43 AM  Advanced Directives  Does Patient Have a Medical Advance Directive? Yes  Type of Advance Directive [place]  Does patient want to make changes to medical advance directive? No - Patient declined  Copy of [place] in Chart? Yes - validated most recent copy scanned in chart (See row information)     Chief Complaint  Patient presents with   Medical Management of Chronic Issues    Patient is here for a follow up for chronic conditions, discuss need for Shingrix and updated Tdap. [facility] verified     HPI:  Pt is a 88 y.o. female seen today for medical management of chronic diseases.    She currently resides on the assisted living unit at [facility]. PMH: atherosclerosis of abdominal aorta, HTN, atrial flutter, orthostatic hypotension, syncope, GERD, constipation, ovarian cancer, OA, CKD III, anemia, obesity and insomnia.   HTN- BUN/creat 26/1.18 05/27/2021, not on scheduled medications at this time due to orthostatic hypotension, hydralazine prn for SBP>  170 Cognitive impairment- nursing reports one episode of confusion in last week, appropriate today during encounter Hyponatremia- Na 137 05/27/2021, remains on sodium tablets Atrial flutter-  followed by cardiology, revealed on 30-day monitor 06/04/2020, not on anticoagulation due to falls Hypothyroidism- TSH 3.50 03/28/2021, remains on levothyroxine GERD- hgb 11.6 05/27/2021, denies increased reflux, remains on Protonix and TUMS prn Bilateral ankle edema- sits in [place] most of day, remains on sodium tablets Obesity/weight gain- see weights below Insomnia- sleeping well with melatonin qhs  She reports being happy in [place]. Excited to celebrate her birthday next week.   No recent falls or injures.   Recent blood pressures:  07/05- 166/87  06/28- 124/67  06/21- 126/60  Recent weights:  07/03- 199.6 lbs  06/02- 199.6 lbs  05/01- 197.4 lbs     Past Medical History:  Diagnosis Date   Anemia associated with acute blood loss 04/03/2011   Arthritis    knees    Ascites    Blood transfusion    hx of 2778    Complication of anesthesia    Sodium drops per pt    Cystitis    DIARRHEA, ANTIBIOTIC ASSOCIATED 05/31/2009   Qualifier: Diagnosis of  By: Tommy Medal MD, Cornelius     GERD (gastroesophageal reflux disease)    H/O hiatal hernia    Hyperlipidemia    Hypertension    Hyponatremia    Neutropenia with fever ([place]) 05/18/2011   OSTEOMYELITIS, CHRONIC, LOWER LEG 04/23/2009   Qualifier: Diagnosis of  By: Johnnye Sima MD, Felecia Shelling    OSTEOPOROSIS 04/23/2009   Qualifier: Diagnosis of  By: Johnnye Sima MD, Dellis Filbert  Ovarian cancer ([place]) 01/25/2011   Pneumonia    hx of    Postmenopausal atrophic vaginitis    PROSTHETIC JOINT COMPLICATION 7/61/9509   Qualifier: Diagnosis of  By: Tommy Medal MD, Cornelius     Renal disorder    Decreased kidney function   Staph infection 2010   after knee replacement   Urinary frequency    Vulvitis    Past Surgical History:  Procedure Laterality Date    ABDOMINAL HYSTERECTOMY  04/01/2011   Procedure: HYSTERECTOMY ABDOMINAL;  Surgeon: Alvino Chapel, MD;  [facility];  Service: Gynecology;  Laterality: N/A;   APPENDECTOMY     JOINT REPLACEMENT     R knee in 2008, 5 operations on L knee   LAPAROTOMY  04/01/2011   Procedure: EXPLORATORY LAPAROTOMY;  Surgeon: Alvino Chapel, MD;  Location: [facility];  Service: Gynecology;  Laterality: N/A;   OTHER SURGICAL HISTORY     hx of C section 1966   SALPINGOOPHORECTOMY  04/01/2011   Procedure: SALPINGO OOPHERECTOMY;  Surgeon: Alvino Chapel, MD;  Location: [facility];  Service: Gynecology;  Laterality: Bilateral;   TONSILLECTOMY      Allergies  Allergen Reactions   Morphine And Related Anaphylaxis and Other (See Comments)    Given after knee replacement and code blue occurred   Morphine Sulfate Anaphylaxis   Penicillins Hives, Itching and Other (See Comments)    Syncope, also   Nsaids Other (See Comments)    Because of an abnormal kidney test result   Penicillin V Hives   Shellfish Allergy Nausea And Vomiting and Other (See Comments)    Pt has shellfish allergy only.  Has had IV contrast x 2 and did fine.   Shellfish-Derived Products Nausea And Vomiting    Outpatient Encounter Medications as of 08/16/2021  Medication Sig   acetaminophen (TYLENOL) 500 MG tablet Take 1,000 mg by mouth every 8 (eight) hours as needed.   azelastine (ASTELIN) 0.1 % nasal spray Place 1 spray into both nostrils daily as needed for rhinitis or allergies.   B Complex-C-Folic Acid (B-COMPLEX/FOLIC ACID/VITAMIN C PO) Take 1 tablet by mouth daily with breakfast.   calcium carbonate (TUMS EX) 750 MG chewable tablet Chew 750 mg by mouth daily as needed for heartburn.   Cholecalciferol (VITAMIN D3) 50 MCG (2000 UT) TABS Take 2,000 Units by mouth in the morning.   cycloSPORINE (RESTASIS) 0.05 % ophthalmic emulsion Place 1 drop into both eyes 2 (two) times daily.   diclofenac Sodium (VOLTAREN) 1 % GEL  Apply 2 g topically in the morning and at bedtime.   docusate sodium (COLACE) 100 MG capsule Take 100 mg by mouth daily as needed for mild constipation.   hydrALAZINE (APRESOLINE) 10 MG tablet Take 10 mg by mouth 2 (two) times daily as needed.   levothyroxine (SYNTHROID) 25 MCG tablet Take 25 mcg by mouth daily before breakfast.   meclizine (ANTIVERT) 12.5 MG tablet Take 1 tablet (12.5 mg total) by mouth 2 (two) times daily as needed for dizziness.   melatonin 3 MG TABS tablet Take 1 tablet (3 mg total) by mouth at bedtime.   nystatin (MYCOSTATIN/NYSTOP) powder Apply 1 application topically 2 (two) times daily as needed.   pantoprazole (PROTONIX) 40 MG tablet Take 1 tablet (40 mg total) by mouth 2 (two) times daily.   sodium chloride 1 g tablet Take 1 g by mouth 2 (two) times daily.   zinc oxide 20 % ointment Apply 1 application topically as needed (for  redness- to be applied to buttocks/peri area and after every incontinent episode).   No facility-administered encounter medications on file as of 08/16/2021.    Review of Systems  Constitutional:  Negative for activity change, appetite change, chills, fatigue and fever.  HENT:  Positive for hearing loss. Negative for congestion and trouble swallowing.   Eyes:  Negative for discharge and itching.  Respiratory:  Negative for cough, shortness of breath and wheezing.   Cardiovascular:  Positive for leg swelling. Negative for chest pain.  Gastrointestinal:  Negative for abdominal distention, abdominal pain, blood in stool, constipation, diarrhea, nausea and vomiting.  Genitourinary:  Negative for dysuria, frequency and hematuria.  Musculoskeletal:  Positive for arthralgias and gait problem.  Skin:  Negative for wound.  Neurological:  Positive for weakness. Negative for dizziness and headaches.  Psychiatric/Behavioral:  Positive for confusion. Negative for dysphoric mood and sleep disturbance. The patient is not nervous/anxious.     Immunization  History  Administered Date(s) Administered   Influenza Split 11/13/2012, 12/13/2012, 11/30/2013, 10/09/2014, 11/24/2018   Influenza, High Dose Seasonal PF 12/01/2013, 10/04/2014, 10/12/2015, 11/06/2016, 11/12/2017   Influenza,inj,Quad PF,6+ Mos 10/24/2010   Influenza-Unspecified 10/24/2010, 11/01/2019, 12/04/2020   Moderna SARS-COV2 Booster Vaccination 07/18/2020   Moderna Sars-Covid-2 Vaccination 02/14/2019, 03/14/2019, 12/26/2019   PFIZER(Purple Top)SARS-COV-2 Vaccination 10/31/2020   Pfizer Covid-19 Vaccine Bivalent Booster 26yr & up 10/31/2020   Pneumococcal Conjugate-13 09/28/2013   Pneumococcal Polysaccharide-23 03/05/2012   Td 08/27/2000   Tdap 08/27/2000, 10/16/2010   Zoster, Live 01/11/2013, 03/31/2020   Pertinent  Health Maintenance Due  Topic Date Due   INFLUENZA VACCINE  09/10/2021   DEXA SCAN  Completed      04/29/2020    1:30 AM 04/29/2020    7:00 AM 04/29/2020    8:00 PM 04/30/2020    1:00 PM 08/16/2020    3:47 PM  Fall Risk  Falls in the past year?     1  Was there an injury with Fall?     0  Fall Risk Category Calculator     2  Fall Risk Category     Moderate  Patient Fall Risk Level High fall risk High fall risk High fall risk High fall risk   Patient at Risk for Falls Due to     History of fall(s);Impaired balance/gait;Impaired mobility;No Fall Risks  Fall risk Follow up     Falls evaluation completed;Education provided;Falls prevention discussed   Functional Status Survey:    Vitals:   08/16/21 0941  BP: (!) 166/87  Pulse: 80  Resp: 18  Temp: (!) 97 F (36.1 C)  SpO2: 96%  Weight: 199 lb 9.6 oz (90.5 kg)  Height: '5\' 3"'$  (1.6 m)   Body mass index is 35.36 kg/m. Physical Exam Vitals reviewed.  Constitutional:      General: She is not in acute distress.    Appearance: She is obese.  HENT:     Head: Normocephalic.     Right Ear: There is no impacted cerumen.     Left Ear: There is no impacted cerumen.     Ears:     Comments: HOH    Nose: Nose  normal.     Mouth/Throat:     Mouth: Mucous membranes are moist.  Eyes:     General:        Right eye: No discharge.        Left eye: No discharge.  Cardiovascular:     Rate and Rhythm: Normal rate and regular rhythm.  Pulses: Normal pulses.     Heart sounds: Murmur heard.  Pulmonary:     Effort: Pulmonary effort is normal. No respiratory distress.     Breath sounds: Normal breath sounds. No wheezing.  Abdominal:     General: Bowel sounds are normal. There is no distension.     Palpations: Abdomen is soft.     Tenderness: There is no abdominal tenderness.     Hernia: A hernia is present.  Musculoskeletal:     Cervical back: Neck supple.     Right lower leg: No edema.     Left lower leg: No edema.  Skin:    General: Skin is warm and dry.  Neurological:     General: No focal deficit present.     Mental Status: She is oriented to person, place, and time.     Motor: Weakness present.     Gait: Gait abnormal.     Comments: PWC  Psychiatric:        Mood and Affect: Mood normal.        Behavior: Behavior normal.     Labs reviewed: Recent Labs    11/19/20 0000  NA 138  K 4.6  CL 105  CO2 27*  BUN 34*  CREATININE 1.0  CALCIUM 9.2   Recent Labs    11/19/20 0000  AST 16  ALT 9  ALKPHOS 56  ALBUMIN 3.5   Recent Labs    11/19/20 0000  WBC 5.4  HGB 11.3*  HCT 36  PLT 222   Lab Results  Component Value Date   TSH 3.50 03/28/2021   No results found for: "HGBA1C" No results found for: "CHOL", "HDL", "LDLCALC", "LDLDIRECT", "TRIG", "CHOLHDL"  Significant Diagnostic Results in last 30 days:  No results found.  Assessment/Plan 1. Essential hypertension - followed by cardiology - on sodium tablets due to hyponatremia - cont hydralazine for SBP> 170  2. Cognitive impairment - appropriate today - ? Related to [facility]  3. Hyponatremia - cont sodium tablets  4. Atrial flutter, unspecified type ([place]) - off medications due to falls  5. Acquired  hypothyroidism - TSH stable - cont levothyroxine  6. Gastroesophageal reflux disease, unspecified whether esophagitis present - hgb stable   7. Ankle edema, bilateral - non pitting - cont elevating legs in recliner  8. Obesity (BMI 30.0-34.9) - BMI 35.36 - recently placed on levothyroxine - advised to limit calories and snacking  9. Primary insomnia - sleeping well with melatonin    Family/ staff Communication: plan discussed with patient and nurse  Labs/tests ordered:  none

## 2021-08-23 ENCOUNTER — Encounter: Payer: Self-pay | Admitting: Orthopedic Surgery

## 2021-08-23 ENCOUNTER — Non-Acute Institutional Stay (INDEPENDENT_AMBULATORY_CARE_PROVIDER_SITE_OTHER): Payer: Medicare Other | Admitting: Orthopedic Surgery

## 2021-08-23 DIAGNOSIS — Z0000 Encounter for general adult medical examination without abnormal findings: Secondary | ICD-10-CM | POA: Diagnosis not present

## 2021-08-23 NOTE — Progress Notes (Signed)
Subjective:   [name] is a 88 y.o. female who presents for Medicare Annual (Subsequent) preventive examination.  Place of Service: [facility] Provider: Windell Moulding, AGNP-C   Review of Systems     Cardiac Risk Factors include: advanced age (>16mn, >>45women);sedentary lifestyle;hypertension     Objective:    Today's Vitals   08/23/21 1113  BP: 138/70  Pulse: 88  Resp: 20  Temp: (!) 97 F (36.1 C)  SpO2: 99%  Weight: 199 lb 9.6 oz (90.5 kg)  Height: '5\' 3"'$  (1.6 m)   Body mass index is 35.36 kg/m.     08/23/2021   11:22 AM 08/16/2021    9:43 AM 05/23/2021   10:40 AM 05/06/2021    9:40 AM 03/15/2021    2:42 PM 02/18/2021    9:57 AM 11/15/2020   10:13 AM  Advanced Directives  Does Patient Have a Medical Advance Directive? Yes Yes Yes Yes Yes Yes Yes  Type of AParamedicof [place]Living will Healthcare Power of [place]Living will HSwarth[place]Living will HC[place]ugaLiving will HCarl JunctionLiving will HMit[place]Living will  Does patient want to make changes to medical advance directive? No - Patient declined No - Patient declined No - Patient declined No - Patient declined No - Patient declined No - Patient declined No - Patient declined  Copy of HL[place]ingtonin Chart? Yes - validated most recent copy scanned in chart (See row information) Yes - validated most recent copy scanned in chart (See row information) Yes - validated most recent copy scanned in chart (See row information) Yes - validated most recent copy scanned in chart (See row information) Yes - validated most recent copy scanned in chart (See row information) Yes - validated most recent copy scanned in chart (See row information) Yes - validated most recent copy scanned in chart (See row information)    Current Medications (verified) Outpatient  Encounter Medications as of 08/23/2021  Medication Sig   acetaminophen (TYLENOL) 500 MG tablet Take 1,000 mg by mouth every 8 (eight) hours as needed.   azelastine (ASTELIN) 0.1 % nasal spray Place 1 spray into both nostrils daily as needed for rhinitis or allergies.   B Complex-C-Folic Acid (B-COMPLEX/FOLIC ACID/VITAMIN C PO) Take 1 tablet by mouth daily with breakfast.   bimatoprost (LUMIGAN) 0.03 % ophthalmic solution Place 1 drop into both eyes at bedtime.   calcium carbonate (TUMS EX) 750 MG chewable tablet Chew 750 mg by mouth daily as needed for heartburn.   Cholecalciferol (VITAMIN D3) 50 MCG (2000 UT) TABS Take 2,000 Units by mouth in the morning.   cycloSPORINE (RESTASIS) 0.05 % ophthalmic emulsion Place 1 drop into both eyes 2 (two) times daily.   diclofenac Sodium (VOLTAREN) 1 % GEL Apply 2 g topically in the morning and at bedtime.   docusate sodium (COLACE) 100 MG capsule Take 100 mg by mouth daily as needed for mild constipation.   hydrALAZINE (APRESOLINE) 10 MG tablet Take 10 mg by mouth 2 (two) times daily as needed.   levothyroxine (SYNTHROID) 25 MCG tablet Take 25 mcg by mouth daily before breakfast.   meclizine (ANTIVERT) 12.5 MG tablet Take 1 tablet (12.5 mg total) by mouth 2 (two) times daily as needed for dizziness.   melatonin 3 MG TABS tablet Take 1 tablet (3 mg total) by mouth at bedtime.   miconazole (MICOTIN) 2 % powder  Apply topically daily. Patient may apply to dry skin under breasts daily and after showering   nystatin (MYCOSTATIN/NYSTOP) powder Apply 1 application topically 2 (two) times daily as needed.   pantoprazole (PROTONIX) 40 MG tablet Take 1 tablet (40 mg total) by mouth 2 (two) times daily.   sodium chloride 1 g tablet Take 1 g by mouth 2 (two) times daily.   zinc oxide 20 % ointment Apply 1 application topically as needed (for redness- to be applied to buttocks/peri area and after every incontinent episode).   No facility-administered encounter  medications on file as of 08/23/2021.    Allergies (verified) Morphine and related, Morphine sulfate, Penicillins, Nsaids, Penicillin v, Shellfish allergy, and Shellfish-derived products   History: Past Medical History:  Diagnosis Date   Anemia associated with acute blood loss 04/03/2011   Arthritis    knees    Ascites    Blood transfusion    hx of 2426    Complication of anesthesia    Sodium drops per pt    Cystitis    DIARRHEA, ANTIBIOTIC ASSOCIATED 05/31/2009   Qualifier: Diagnosis of  By: Tommy Medal MD, Cornelius     GERD (gastroesophageal reflux disease)    H/O hiatal hernia    Hyperlipidemia    Hypertension    Hyponatremia    Neutropenia with fever ([place]) 05/18/2011   OSTEOMYELITIS, CHRONIC, LOWER LEG 04/23/2009   Qualifier: Diagnosis of  By: Johnnye Sima MD, Felecia Shelling    OSTEOPOROSIS 04/23/2009   Qualifier: Diagnosis of  By: Johnnye Sima MD, Jeffrey     Ovarian cancer ([place]) 01/25/2011   Pneumonia    hx of    Postmenopausal atrophic vaginitis    PROSTHETIC JOINT COMPLICATION 8/34/1962   Qualifier: Diagnosis of  By: Tommy Medal MD, Cornelius     Renal disorder    Decreased kidney function   Staph infection 2010   after knee replacement   Urinary frequency    Vulvitis    Past Surgical History:  Procedure Laterality Date   ABDOMINAL HYSTERECTOMY  04/01/2011   Procedure: HYSTERECTOMY ABDOMINAL;  Surgeon: Alvino Chapel, MD;  Location: [facility];  Service: Gynecology;  Laterality: N/A;   APPENDECTOMY     JOINT REPLACEMENT     R knee in 2008, 5 operations on L knee   LAPAROTOMY  04/01/2011   Procedure: EXPLORATORY LAPAROTOMY;  Surgeon: Alvino Chapel, MD;  Location: [facility];  Service: Gynecology;  Laterality: N/A;   OTHER SURGICAL HISTORY     hx of C section 1966   SALPINGOOPHORECTOMY  04/01/2011   Procedure: SALPINGO OOPHERECTOMY;  Surgeon: Alvino Chapel, MD;  Location: [facility];  Service: Gynecology;  Laterality: Bilateral;   TONSILLECTOMY      Family History  Problem Relation Age of Onset   Cancer Other        Bladder cancer   Heart attack Brother    Diabetes Brother    Social History   Socioeconomic History   Marital status: Widowed    Spouse name: Not on file   Number of children: Not on file   Years of education: Not on file   Highest education level: Not on file  Occupational History   Not on file  Tobacco Use   Smoking status: Former    Types: Cigarettes    Quit date: 02/11/1952    Years since quitting: 69.5   Smokeless tobacco: Never  Vaping Use   Vaping Use: Never used  Substance and Sexual  Activity   Alcohol use: Not Currently   Drug use: Never   Sexual activity: Never  Other Topics Concern   Not on file  Social History Narrative   Not on file   Social Determinants of Health   Financial Resource Strain: Low Risk  (08/23/2021)   Overall Financial Resource Strain (CARDIA)    Difficulty of Paying Living Expenses: Not hard at all  Food Insecurity: No Food Insecurity (08/23/2021)   Hunger Vital Sign    Worried About Running Out of Food in the Last Year: Never true    Ran Out of Food in the Last Year: Never true  Transportation Needs: No Transportation Needs (08/23/2021)   PRAPARE - Hydrologist (Medical): No    Lack of Transportation (Non-Medical): No  Physical Activity: Insufficiently Active (08/23/2021)   Exercise Vital Sign    Days of Exercise per Week: 7 days    Minutes of Exercise per Session: 10 min  Stress: No Stress Concern Present (08/16/2020)   [place]    Feeling of Stress : Only a little  Social Connections: Socially Isolated (08/23/2021)   Social Connection and Isolation Panel [NHANES]    Frequency of Communication with Friends and Family: More than three times a week    Frequency of Social Gatherings with Friends and Family: Three times a week    Attends Religious Services: Never    Active  Member of Clubs or Organizations: No    Attends Archivist Meetings: Never    Marital Status: Widowed    Tobacco Counseling Counseling given: Not Answered   Clinical Intake:  Pre-visit preparation completed: No  Pain : No/denies pain     BMI - recorded: 35.36 Nutritional Status: BMI > 30  Obese Nutritional Risks: None Diabetes: No  How often do you need to have someone help you when you read instructions, pamphlets, or other written materials from your doctor or pharmacy?: 3 - Sometimes What is the last grade level you completed in school?: 2 years college  Diabetic?No  Interpreter Needed?: No      Activities of Daily Living    08/23/2021    1:08 PM  In your present state of health, do you have any difficulty performing the following activities:  Hearing? 0  Vision? 0  Difficulty concentrating or making decisions? 0  Walking or climbing stairs? 1  Dressing or bathing? 0  Doing errands, shopping? 0  Preparing Food and eating ? Y  Using the Toilet? Y  In the past six months, have you accidently leaked urine? Y  Do you have problems with loss of bowel control? N  Managing your Medications? Y  Managing your Finances? Y  Housekeeping or managing your Housekeeping? Y    Patient Care Team: Virgie Dad, MD as PCP - General (Internal Medicine) Donato Heinz, MD as PCP - Cardiology (Cardiology) Brien Few, MD as Consulting Physician (Obstetrics and Gynecology) Marti Sleigh, MD as Consulting Physician (Gynecology) Nancy Marus, MD as Consulting Physician (Gynecologic Oncology)  Indicate any recent Medical Services you may have received from other than [facility] providers in the past year (date may be approximate).     Assessment:   This is a routine wellness examination for [place].  Hearing/Vision screen No results found.  Dietary issues and exercise activities discussed: Current Exercise Habits: The patient does not  participate in regular exercise at present, Exercise limited by: orthopedic condition(s)  Goals Addressed             This Visit's Progress    Maintain Mobility and Function   On track    Evidence-based guidance:  Emphasize the importance of physical activity and aerobic exercise as included in treatment plan; assess barriers to adherence; consider patient's abilities and preferences.  Encourage gradual increase in activity or exercise instead of stopping if pain occurs.  Reinforce individual therapy exercise prescription, such as strengthening, stabilization and stretching programs.  Promote optimal body mechanics to stabilize the spine with lifting and functional activity.  Encourage activity and mobility modifications to facilitate optimal function, such as using a log roll for bed mobility or dressing from a seated position.  Reinforce individual adaptive equipment recommendations to limit excessive spinal movements, such as a Systems analyst.  Assess adequacy of sleep; encourage use of sleep hygiene techniques, such as bedtime routine; use of white noise; dark, cool bedroom; avoiding daytime naps, heavy meals or exercise before bedtime.  Promote positions and modification to optimize sleep and sexual activity; consider pillows or positioning devices to assist in maintaining neutral spine.  Explore options for applying ergonomic principles at work and home, such as frequent position changes, using ergonomically designed equipment and working at optimal height.  Promote modifications to increase comfort with driving such as lumbar support, optimizing seat and steering wheel position, using cruise control and taking frequent rest stops to stretch and walk.   Notes:        Depression Screen    08/23/2021    1:06 PM 08/16/2020    3:42 PM  PHQ 2/9 Scores  PHQ - 2 Score 0 0    Fall Risk    08/23/2021    1:08 PM 08/16/2020    3:47 PM 03/02/2014   10:55 AM  [place] in  the past year? 1 1 No  Number falls in past yr: 0 1   Injury with Fall? 0 0   Risk for fall due to : History of fall(s);Impaired balance/gait;Impaired mobility History of fall(s);Impaired balance/gait;Impaired mobility;No Fall Risks   Follow up Falls evaluation completed;Education provided;Falls prevention discussed Falls evaluation completed;Education provided;Falls prevention discussed     FALL RISK PREVENTION PERTAINING TO THE HOME:  Any stairs in or around the home? No  If so, are there any without handrails? No  Home free of loose throw rugs in walkways, pet beds, electrical cords, etc? Yes  Adequate lighting in your home to reduce risk of falls? Yes   ASSISTIVE DEVICES UTILIZED TO PREVENT FALLS:  Life alert? No  Use of a cane, walker or w/c? Yes  Grab bars in the bathroom? Yes  Shower chair or bench in shower? Yes  Elevated toilet seat or a handicapped toilet? Yes   TIMED UP AND GO:  Was the test performed? No .  Length of time to ambulate 10 feet: N/A sec.   Gait slow and steady with assistive device  Cognitive Function:    08/23/2021    1:08 PM 08/16/2020    3:50 PM  MMSE - Mini Mental State Exam  Orientation to time 4 5  Orientation to Place 5 5  Registration 3 3  Attention/ Calculation 5 5  Recall 2 3  Language- name 2 objects 2 2  Language- repeat 1 1  Language- follow 3 step command 3 3  Language- read & follow direction 1 1  Write a sentence 1 1  Copy design 1 1  Total score  28 30        08/23/2021    1:09 PM 08/16/2020    3:51 PM  6CIT Screen  What Year? 0 points 0 points  What month? 0 points 0 points  What time? 0 points 0 points  Count back from 20 0 points 0 points  Months in reverse 0 points 0 points  Repeat phrase 0 points 0 points  Total Score 0 points 0 points    Immunizations Immunization History  Administered Date(s) Administered   Influenza Split 11/13/2012, 12/13/2012, 11/30/2013, 10/09/2014, 11/24/2018   Influenza, High Dose  Seasonal PF 12/01/2013, 10/04/2014, 10/12/2015, 11/06/2016, 11/12/2017   Influenza,inj,Quad PF,6+ Mos 10/24/2010   Influenza-Unspecified 10/24/2010, 11/01/2019, 12/04/2020   Moderna SARS-COV2 Booster Vaccination 07/18/2020   Moderna Sars-Covid-2 Vaccination 02/14/2019, 03/14/2019, 12/26/2019   PFIZER(Purple Top)SARS-COV-2 Vaccination 10/31/2020   Pfizer Covid-19 Vaccine Bivalent Booster 24yr & up 10/31/2020   Pneumococcal Conjugate-13 09/28/2013   Pneumococcal Polysaccharide-23 03/05/2012   Td 08/27/2000   Tdap 08/27/2000, 10/16/2010   Zoster, Live 01/11/2013, 03/31/2020    TDAP status: Due, Education has been provided regarding the importance of this vaccine. Advised may receive this vaccine at local pharmacy or Health Dept. Aware to provide a copy of the vaccination record if obtained from local pharmacy or Health Dept. Verbalized acceptance and understanding.  Flu Vaccine status: Up to date  Pneumococcal vaccine status: Up to date  Covid-19 vaccine status: Completed vaccines  Qualifies for Shingles Vaccine? Yes   Zostavax completed Yes   Shingrix Completed?: No.    Education has been provided regarding the importance of this vaccine. Patient has been advised to call insurance company to determine out of pocket expense if they have not yet received this vaccine. Advised may also receive vaccine at local pharmacy or Health Dept. Verbalized acceptance and understanding.  Screening Tests Health Maintenance  Topic Date Due   Zoster Vaccines- Shingrix (1 of 2) Never done   TETANUS/TDAP  10/15/2020   INFLUENZA VACCINE  09/10/2021   Pneumonia Vaccine 88 Years old  Completed   DEXA SCAN  Completed   COVID-19 Vaccine  Completed   HPV VACCINES  Aged Out    Health Maintenance  Health Maintenance Due  Topic Date Due   Zoster Vaccines- Shingrix (1 of 2) Never done   TETANUS/TDAP  10/15/2020    Colorectal cancer screening: No longer required.   Mammogram status: No longer  required due to advanced age.  Bone Density status: Completed 2013. Results reflect: Bone density results: OSTEOPOROSIS. Repeat every 2- no further studies per goals of care years.  Lung Cancer Screening: (Low Dose CT Chest recommended if Age 86-80years, 30 pack-year currently smoking OR have quit w/in 15years.) does not qualify.   Lung Cancer Screening Referral: No  Additional Screening:  Hepatitis C Screening: does not qualify; advanced age  Vision Screening: Recommended annual ophthalmology exams for early detection of glaucoma and other disorders of the eye. Is the patient up to date with their annual eye exam?  No  Who is the provider or what is the name of the office in which the patient attends annual eye exams? unsure If pt is not established with a provider, would they like to be referred to a provider to establish care? No .   Dental Screening: Recommended annual dental exams for proper oral hygiene  Community Resource Referral / Chronic Care Management: CRR required this visit?  No   CCM required this visit?  No      Plan:  I have personally reviewed and noted the following in the patient's chart:   Medical and social history Use of alcohol, tobacco or illicit drugs  Current medications and supplements including opioid prescriptions.  Functional ability and status Nutritional status Physical activity Advanced directives List of other physicians Hospitalizations, surgeries, and ER visits in previous 12 months Vitals Screenings to include cognitive, depression, and falls Referrals and appointments  In addition, I have reviewed and discussed with patient certain preventive protocols, quality metrics, and best practice recommendations. A written personalized care plan for preventive services as well as general preventive health recommendations were provided to patient.     Yvonna Alanis, NP   08/23/2021   Nurse Notes: Tdap ordered, HPOA does not want  additional shingles vaccines

## 2021-09-02 ENCOUNTER — Encounter: Payer: Self-pay | Admitting: Orthopedic Surgery

## 2021-09-02 ENCOUNTER — Non-Acute Institutional Stay: Payer: Medicare Other | Admitting: Orthopedic Surgery

## 2021-09-02 DIAGNOSIS — M25471 Effusion, right ankle: Secondary | ICD-10-CM | POA: Diagnosis not present

## 2021-09-02 DIAGNOSIS — E871 Hypo-osmolality and hyponatremia: Secondary | ICD-10-CM

## 2021-09-02 DIAGNOSIS — R4189 Other symptoms and signs involving cognitive functions and awareness: Secondary | ICD-10-CM | POA: Diagnosis not present

## 2021-09-02 DIAGNOSIS — I10 Essential (primary) hypertension: Secondary | ICD-10-CM

## 2021-09-02 DIAGNOSIS — E669 Obesity, unspecified: Secondary | ICD-10-CM

## 2021-09-02 DIAGNOSIS — M25472 Effusion, left ankle: Secondary | ICD-10-CM

## 2021-09-02 DIAGNOSIS — I4892 Unspecified atrial flutter: Secondary | ICD-10-CM

## 2021-09-02 DIAGNOSIS — E039 Hypothyroidism, unspecified: Secondary | ICD-10-CM

## 2021-09-02 NOTE — Progress Notes (Signed)
Location:  [place] Room Number: NO/10/AL Place of Service:  ALF [phone]) Provider:  Windell Moulding, NP  Virgie Dad, MD  Patient Care Team: Virgie Dad, MD as PCP - General (Internal Medicine) Donato Heinz, MD as PCP - Cardiology (Cardiology) Brien Few, MD as Consulting Physician (Obstetrics and Gynecology) Marti Sleigh, MD as Consulting Physician (Gynecology) Nancy Marus, MD as Consulting Physician (Gynecologic Oncology)  Extended Emergency Contact Information Primary Emergency Contact: [facility] Address: [address]          [place], [place] [address] [name] of [place] Phone: [phone] Mobile Phone: [phone] Relation: Daughter Secondary Emergency Contact: [place] Mobile Phone: [phone] Relation: Son  Code Status:  FULL Goals of care: Advanced Directive information    09/02/2021   11:41 AM  Advanced Directives  Does Patient Have a Medical Advance Directive? Yes  Type of Paramedic of [place];Living will  Does patient want to make changes to medical advance directive? No - Patient declined  Copy of [place] in Chart? Yes - validated most recent copy scanned in chart (See row information)     Chief Complaint  Patient presents with   Acute Visit    Patient is being seen for bilateral edema of LE    HPI:  Pt is a 88 y.o. female seen today for an acute visit for bilateral leg edema.  She currently resides on the assisted living unit at [facility]. PMH: atherosclerosis of abdominal aorta, HTN, atrial flutter, orthostatic hypotension, syncope, GERD, constipation, ovarian cancer, OA, CKD III, anemia, obesity and insomnia.   Nursing reports pitting edema to lower extremities x 2 days. She has refused to wear ted hose the past few days due to swelling. She will elevated legs in recliner. Ambulates in [place]. H/o hyponatremia. She is on sodium  tablets daily. Also on 1500 cc fluid restriction. Denies chest pain, sob, or weight gain.   HTN- BUN/creat 26/1.18 05/27/2021, not on scheduled medications at this time due to orthostatic hypotension, hydralazine prn for SBP> 170 Cognitive impairment- nursing reports one episode of confusion in last week, appropriate today during encounter Hyponatremia- Na 137 05/27/2021, remains on sodium tablets Atrial flutter-  followed by cardiology, revealed on 30-day monitor 06/04/2020, not on anticoagulation due to falls Hypothyroidism- TSH 3.50 03/28/2021, remains on levothyroxine Obesity- BMI 35.36, current weight 199 lbs- unchanged from 1 month ago  Past Medical History:  Diagnosis Date   Anemia associated with acute blood loss 04/03/2011   Arthritis    knees    Ascites    Blood transfusion    hx of 6440    Complication of anesthesia    Sodium drops per pt    Cystitis    DIARRHEA, ANTIBIOTIC ASSOCIATED 05/31/2009   Qualifier: Diagnosis of  By: Tommy Medal MD, Cornelius     GERD (gastroesophageal reflux disease)    H/O hiatal hernia    Hyperlipidemia    Hypertension    Hyponatremia    Neutropenia with fever ([place]) 05/18/2011   OSTEOMYELITIS, CHRONIC, LOWER LEG 04/23/2009   Qualifier: Diagnosis of  By: Johnnye Sima MD, Felecia Shelling    OSTEOPOROSIS 04/23/2009   Qualifier: Diagnosis of  By: Johnnye Sima MD, Jeffrey     Ovarian cancer ([place]) 01/25/2011   Pneumonia    hx of    Postmenopausal atrophic vaginitis    PROSTHETIC JOINT COMPLICATION 3/47/4259   Qualifier: Diagnosis of  By: Tommy Medal MD, Roderic Scarce  Renal disorder    Decreased kidney function   Staph infection 2010   after knee replacement   Urinary frequency    Vulvitis    Past Surgical History:  Procedure Laterality Date   ABDOMINAL HYSTERECTOMY  04/01/2011   Procedure: HYSTERECTOMY ABDOMINAL;  Surgeon: Alvino Chapel, MD;  Location: [facility];  Service: Gynecology;  Laterality: N/A;   APPENDECTOMY     JOINT REPLACEMENT      R knee in 2008, 5 operations on L knee   LAPAROTOMY  04/01/2011   Procedure: EXPLORATORY LAPAROTOMY;  Surgeon: Alvino Chapel, MD;  Location: [facility];  Service: Gynecology;  Laterality: N/A;   OTHER SURGICAL HISTORY     hx of C section 1966   SALPINGOOPHORECTOMY  04/01/2011   Procedure: SALPINGO OOPHERECTOMY;  Surgeon: Alvino Chapel, MD;  Location: [facility];  Service: Gynecology;  Laterality: Bilateral;   TONSILLECTOMY      Allergies  Allergen Reactions   Morphine And Related Anaphylaxis and Other (See Comments)    Given after knee replacement and code blue occurred   Morphine Sulfate Anaphylaxis   Penicillins Hives, Itching and Other (See Comments)    Syncope, also   Nsaids Other (See Comments)    Because of an abnormal kidney test result   Penicillin V Hives   Shellfish Allergy Nausea And Vomiting and Other (See Comments)    Pt has shellfish allergy only.  Has had IV contrast x 2 and did fine.   Shellfish-Derived Products Nausea And Vomiting    Outpatient Encounter Medications as of 09/02/2021  Medication Sig   acetaminophen (TYLENOL) 500 MG tablet Take 1,000 mg by mouth every 8 (eight) hours as needed.   azelastine (ASTELIN) 0.1 % nasal spray Place 1 spray into both nostrils daily as needed for rhinitis or allergies.   B Complex-C-Folic Acid (B-COMPLEX/FOLIC ACID/VITAMIN C PO) Take 1 tablet by mouth daily with breakfast.   bimatoprost (LUMIGAN) 0.03 % ophthalmic solution Place 1 drop into both eyes at bedtime.   calcium carbonate (TUMS EX) 750 MG chewable tablet Chew 750 mg by mouth daily as needed for heartburn.   Cholecalciferol (VITAMIN D3) 50 MCG (2000 UT) TABS Take 2,000 Units by mouth in the morning.   cycloSPORINE (RESTASIS) 0.05 % ophthalmic emulsion Place 1 drop into both eyes 2 (two) times daily.   diclofenac Sodium (VOLTAREN) 1 % GEL Apply 2 g topically in the morning and at bedtime.   docusate sodium (COLACE) 100 MG capsule Take 100 mg by mouth daily as  needed for mild constipation.   hydrALAZINE (APRESOLINE) 10 MG tablet Take 10 mg by mouth 2 (two) times daily as needed.   levothyroxine (SYNTHROID) 25 MCG tablet Take 25 mcg by mouth daily before breakfast.   meclizine (ANTIVERT) 12.5 MG tablet Take 1 tablet (12.5 mg total) by mouth 2 (two) times daily as needed for dizziness.   melatonin 3 MG TABS tablet Take 1 tablet (3 mg total) by mouth at bedtime.   miconazole (MICOTIN) 2 % powder Apply topically daily. Patient may apply to dry skin under breasts daily and after showering   nystatin (MYCOSTATIN/NYSTOP) powder Apply 1 application topically 2 (two) times daily as needed.   pantoprazole (PROTONIX) 40 MG tablet Take 1 tablet (40 mg total) by mouth 2 (two) times daily.   sodium chloride 1 g tablet Take 1 g by mouth 2 (two) times daily.   zinc oxide 20 % ointment Apply 1 application topically as needed (for redness- to  be applied to buttocks/peri area and after every incontinent episode).   No facility-administered encounter medications on file as of 09/02/2021.    Review of Systems  Constitutional:  Negative for activity change, appetite change, chills, fatigue and fever.  HENT:  Positive for hearing loss. Negative for trouble swallowing.   Eyes:  Negative for visual disturbance.  Respiratory:  Negative for cough, shortness of breath and wheezing.   Cardiovascular:  Positive for leg swelling. Negative for chest pain.  Gastrointestinal:  Negative for abdominal distention, abdominal pain, constipation, diarrhea, nausea and vomiting.  Genitourinary:  Positive for frequency. Negative for dysuria and hematuria.  Musculoskeletal:  Positive for arthralgias and gait problem.  Skin:  Negative for wound.  Neurological:  Positive for weakness. Negative for dizziness and headaches.  Psychiatric/Behavioral:  Positive for confusion. Negative for dysphoric mood and sleep disturbance. The patient is not nervous/anxious.     Immunization History   Administered Date(s) Administered   Influenza Split 11/13/2012, 12/13/2012, 11/30/2013, 10/09/2014, 11/24/2018   Influenza, High Dose Seasonal PF 12/01/2013, 10/04/2014, 10/12/2015, 11/06/2016, 11/12/2017   Influenza,inj,Quad PF,6+ Mos 10/24/2010   Influenza-Unspecified 10/24/2010, 11/01/2019, 12/04/2020   Moderna SARS-COV2 Booster Vaccination 07/18/2020   Moderna Sars-Covid-2 Vaccination 02/14/2019, 03/14/2019, 12/26/2019   PFIZER(Purple Top)SARS-COV-2 Vaccination 10/31/2020   Pfizer Covid-19 Vaccine Bivalent Booster 7yr & up 10/31/2020   Pneumococcal Conjugate-13 09/28/2013   Pneumococcal Polysaccharide-23 03/05/2012   Td 08/27/2000   Tdap 08/27/2000, 10/16/2010   Zoster, Live 01/11/2013, 03/31/2020   Pertinent  Health Maintenance Due  Topic Date Due   INFLUENZA VACCINE  09/10/2021   DEXA SCAN  Completed      04/29/2020    7:00 AM 04/29/2020    8:00 PM 04/30/2020    1:00 PM 08/16/2020    3:47 PM 08/23/2021    1:08 PM  Fall Risk  Falls in the past year?    1 1  Was there an injury with Fall?    0 0  Fall Risk Category Calculator    2 1  Fall Risk Category    Moderate Low  Patient Fall Risk Level High fall risk High fall risk High fall risk  Moderate fall risk  Patient at Risk for Falls Due to    History of fall(s);Impaired balance/gait;Impaired mobility;No Fall Risks History of fall(s);Impaired balance/gait;Impaired mobility  Fall risk Follow up    Falls evaluation completed;Education provided;Falls prevention discussed Falls evaluation completed;Education provided;Falls prevention discussed   Functional Status Survey:    Vitals:   09/02/21 1142  BP: (!) 149/83  Pulse: 78  Resp: 18  Temp: (!) 97 F (36.1 C)  SpO2: 96%  Weight: 199 lb 9.6 oz (90.5 kg)  Height: '5\' 3"'$  (1.6 m)   Body mass index is 35.36 kg/m. Physical Exam Vitals reviewed.  Constitutional:      General: She is not in acute distress.    Appearance: She is obese.  HENT:     Head: Normocephalic.   Eyes:     General:        Right eye: No discharge.        Left eye: No discharge.  Cardiovascular:     Rate and Rhythm: Normal rate. Rhythm irregular.     Pulses: Normal pulses.     Heart sounds: Murmur heard.  Pulmonary:     Effort: Pulmonary effort is normal. No respiratory distress.     Breath sounds: Normal breath sounds. No wheezing.  Abdominal:     General: Bowel sounds are normal. There is  no distension.     Palpations: Abdomen is soft.     Tenderness: There is no abdominal tenderness.  Musculoskeletal:     Cervical back: Neck supple.     Right lower leg: Edema present.     Left lower leg: Edema present.     Comments: R>L, 2+ pitting/1+ pitting  Skin:    General: Skin is warm and dry.     Capillary Refill: Capillary refill takes less than 2 seconds.  Neurological:     General: No focal deficit present.     Mental Status: She is alert and oriented to person, place, and time.     Motor: Weakness present.     Gait: Gait abnormal.     Comments: PWC  Psychiatric:        Mood and Affect: Mood normal.        Behavior: Behavior normal.     Comments: Very pleasant, follows commands, alert to self/place/situation     Labs reviewed: Recent Labs    11/19/20 0000 05/27/21 0000 05/27/21 1244  NA 138  --  22*  K 4.6  --  4.3  CL 105  --  105  CO2 27*  --  23*  BUN 34*  --  26*  CREATININE 1.0  --  1.2*  CALCIUM 9.2 9.0  --    Recent Labs    11/19/20 0000 05/27/21 0000 05/27/21 1244  AST 16  --  12*  ALT 9  --  18  ALKPHOS 56  --  63  ALBUMIN 3.5 3.4*  --    Recent Labs    11/19/20 0000 05/27/21 1244  WBC 5.4 5.6  NEUTROABS  --  3,097.00  HGB 11.3* 11.6*  HCT 36 36  PLT 222 236   Lab Results  Component Value Date   TSH 3.50 03/28/2021   No results found for: "HGBA1C" No results found for: "CHOL", "HDL", "LDLCALC", "LDLDIRECT", "TRIG", "CHOLHDL"  Significant Diagnostic Results in last 30 days:  No results found.  Assessment/Plan: 1. Ankle  edema, bilateral - R>L, 2+ pitting/1+ pitting - unable to wear ted hose at this time - start furosemide 20 mg po Qam x 2 days - KCL 10 meq po Qam x 2 days - cont leg elevation in recliner - recommend restarting ted hose when swelling is decreased  2. Essential hypertension Controlled without medication  3. Cognitive impairment - lives in [place]  4. Hyponatremia - cont sodium tablets  5. Atrial flutter, unspecified type ([place]) - followed by cardiology - off medication due to falls  6. Acquired hypothyroidism - TSH stable - cont levothyroxine  7. Obesity (BMI 30.0-34.9) - BMI 35.36 - sedentary lifestyle - discussed limiting snacks, portion sizes    Family/ staff Communication: plan discussed with patient and nurse  Labs/tests ordered:  none

## 2021-09-08 NOTE — Progress Notes (Unsigned)
Cardiology Office Note:    Date:  09/12/2021   ID:  [name], DOB 06-Nov-1929, MRN [id number]  PCP:  Virgie Dad, MD  Cardiologist:  Donato Heinz, MD  Electrophysiologist:  None   Referring MD: Virgie Dad, MD   Chief Complaint  Patient presents with   Atrial Fibrillation    History of Present Illness:    [name] is a 88 y.o. female with a hx of atrial fibrillation, aortic stenosis, ovarian cancer, CKD, hyperlipidemia, hypertension, obesity who presents for follow-up.  She was referred by Dr. Rex Kras for evaluation of heart murmur, initially seen on 12/30/2018.  Echocardiogram on 01/04/2019 showed LVEF 60 to 81%, grade 1 diastolic dysfunction, normal RV function, mild aortic stenosis.    She was hospitalized in February 2022 with syncope thought to be due to orthostasis.  Ambien, Norvasc, nadolol were discontinued.  Echocardiogram showed no structural heart disease.  She was discharged with heart monitor.  30-day monitor on 06/04/2020 showed 4% A. fib/flutter burden with average rate 134 bpm and one 7 beat run of NSVT.  She was seen by Doreene Adas, PA on 05/07/2020 given new diagnosis atrial fibrillation/flutter.  She has been having issues with dizziness/orthostasis and frequent falls, and decision was made to hold off on anticoagulation.  Since last clinic visit, she reports that she is doing okay.  Denies any chest pain, dyspnea, lightheadedness, syncope,or palpitations.  She reports she is having swelling in her right foot.  She walks with a walker to her meals but otherwise is not very active.  Son reports she has been sleeping more.  Denies any recent falls.   Past Medical History:  Diagnosis Date   Anemia associated with acute blood loss 04/03/2011   Arthritis    knees    Ascites    Blood transfusion    hx of 8299    Complication of anesthesia    Sodium drops per pt    Cystitis    DIARRHEA, ANTIBIOTIC ASSOCIATED 05/31/2009   Qualifier: Diagnosis  of  By: Tommy Medal MD, Cornelius     GERD (gastroesophageal reflux disease)    H/O hiatal hernia    Hyperlipidemia    Hypertension    Hyponatremia    Neutropenia with fever ([place]) 05/18/2011   OSTEOMYELITIS, CHRONIC, LOWER LEG 04/23/2009   Qualifier: Diagnosis of  By: Johnnye Sima MD, Felecia Shelling    OSTEOPOROSIS 04/23/2009   Qualifier: Diagnosis of  By: Johnnye Sima MD, Jeffrey     Ovarian cancer ([place]) 01/25/2011   Pneumonia    hx of    Postmenopausal atrophic vaginitis    PROSTHETIC JOINT COMPLICATION 3/71/6967   Qualifier: Diagnosis of  By: Tommy Medal MD, Cornelius     Renal disorder    Decreased kidney function   Staph infection 2010   after knee replacement   Urinary frequency    Vulvitis     Past Surgical History:  Procedure Laterality Date   ABDOMINAL HYSTERECTOMY  04/01/2011   Procedure: HYSTERECTOMY ABDOMINAL;  Surgeon: Alvino Chapel, MD;  Location: [facility];  Service: Gynecology;  Laterality: N/A;   APPENDECTOMY     JOINT REPLACEMENT     R knee in 2008, 5 operations on L knee   LAPAROTOMY  04/01/2011   Procedure: EXPLORATORY LAPAROTOMY;  Surgeon: Alvino Chapel, MD;  Location: [facility];  Service: Gynecology;  Laterality: N/A;   OTHER SURGICAL HISTORY     hx of C section 1966  SALPINGOOPHORECTOMY  04/01/2011   Procedure: SALPINGO OOPHERECTOMY;  Surgeon: Alvino Chapel, MD;  Location: [facility];  Service: Gynecology;  Laterality: Bilateral;   TONSILLECTOMY      Current Medications: Current Meds  Medication Sig   acetaminophen (TYLENOL) 500 MG tablet Take 1,000 mg by mouth every 8 (eight) hours as needed.   azelastine (ASTELIN) 0.1 % nasal spray Place 1 spray into both nostrils daily as needed for rhinitis or allergies.   B Complex-C-Folic Acid (B-COMPLEX/FOLIC ACID/VITAMIN C PO) Take 1 tablet by mouth daily with breakfast.   bimatoprost (LUMIGAN) 0.03 % ophthalmic solution Place 1 drop into both eyes at bedtime.   calcium carbonate (TUMS EX) 750 MG  chewable tablet Chew 750 mg by mouth daily as needed for heartburn.   Cholecalciferol (VITAMIN D3) 50 MCG (2000 UT) TABS Take 2,000 Units by mouth in the morning.   cycloSPORINE (RESTASIS) 0.05 % ophthalmic emulsion Place 1 drop into both eyes 2 (two) times daily.   diclofenac Sodium (VOLTAREN) 1 % GEL Apply 2 g topically in the morning and at bedtime.   docusate sodium (COLACE) 100 MG capsule Take 100 mg by mouth daily as needed for mild constipation.   furosemide (LASIX) 20 MG tablet Take 20 mg by mouth.   hydrALAZINE (APRESOLINE) 10 MG tablet Take 10 mg by mouth 2 (two) times daily as needed.   levothyroxine (SYNTHROID) 25 MCG tablet Take 25 mcg by mouth daily before breakfast.   meclizine (ANTIVERT) 12.5 MG tablet Take 1 tablet (12.5 mg total) by mouth 2 (two) times daily as needed for dizziness.   melatonin 3 MG TABS tablet Take 1 tablet (3 mg total) by mouth at bedtime.   miconazole (MICOTIN) 2 % powder Apply topically daily. Patient may apply to dry skin under breasts daily and after showering   nystatin (MYCOSTATIN/NYSTOP) powder Apply 1 application topically 2 (two) times daily as needed.   pantoprazole (PROTONIX) 40 MG tablet Take 1 tablet (40 mg total) by mouth 2 (two) times daily.   sodium chloride 1 g tablet Take 1 g by mouth 2 (two) times daily.   zinc oxide 20 % ointment Apply 1 application topically as needed (for redness- to be applied to buttocks/peri area and after every incontinent episode).     Allergies:   Morphine and related, Morphine sulfate, Penicillins, Nsaids, Penicillin v, Shellfish allergy, and Shellfish-derived products   Social History   Socioeconomic History   Marital status: Widowed    Spouse name: Not on file   Number of children: Not on file   Years of education: Not on file   Highest education level: Not on file  Occupational History   Not on file  Tobacco Use   Smoking status: Former    Types: Cigarettes    Quit date: 02/11/1952    Years since  quitting: 69.6   Smokeless tobacco: Never  Vaping Use   Vaping Use: Never used  Substance and Sexual Activity   Alcohol use: Not Currently   Drug use: Never   Sexual activity: Never  Other Topics Concern   Not on file  Social History Narrative   Not on file   Social Determinants of Health   Financial Resource Strain: Low Risk  (08/23/2021)   Overall Financial Resource Strain (CARDIA)    Difficulty of Paying Living Expenses: Not hard at all  Food Insecurity: No Food Insecurity (08/23/2021)   Hunger Vital Sign    Worried About Running Out of Food in the Last  Year: Never true    [name] in the Last Year: Never true  Transportation Needs: No Transportation Needs (08/23/2021)   PRAPARE - Hydrologist (Medical): No    Lack of Transportation (Non-Medical): No  Physical Activity: Insufficiently Active (08/23/2021)   Exercise Vital Sign    Days of Exercise per Week: 7 days    Minutes of Exercise per Session: 10 min  Stress: No Stress Concern Present (08/16/2020)   [place]    Feeling of Stress : Only a little  Social Connections: Socially Isolated (08/23/2021)   Social Connection and Isolation Panel [NHANES]    Frequency of Communication with Friends and Family: More than three times a week    Frequency of Social Gatherings with Friends and Family: Three times a week    Attends Religious Services: Never    Active Member of Clubs or Organizations: No    Attends Archivist Meetings: Never    Marital Status: Widowed     Family History: The patient's family history includes Cancer in an other family member; Diabetes in her brother; Heart attack in her brother.  ROS:   Please see the history of present illness.     All other systems reviewed and are negative.  EKGs/Labs/Other Studies Reviewed:    The following studies were reviewed today:  Cardiac Telemetry Monitoring  06/04/2020: 4% A. fib/flutter burden, average rate 134 bpm One 7 beat run of NSVT   Predominant rhythm is sinus rhythm. Range is 68-1 50 bpm with average of 92 bpm. No sustained ventricular tachycardia, significant pause, or high degree AV block.  4% A. fib/a flutter burden, average rate 134 bpm.  One 7 beat run of NSVT.  Total ventricular ectopy 3%.  1 patient triggered events, corresponding to sinus rhythm.   CT Angio Chest PE 04/16/2020: No evidence of pulmonary emboli.   Small right pleural effusion and bibasilar atelectatic changes.   Hiatal hernia with distal esophageal thickening likely related to reflux.   Aortic Atherosclerosis (ICD10-I70.0).  Echo 04/06/2020:  1. Left ventricular ejection fraction, by estimation, is 70 to 75%. The  left ventricle has hyperdynamic function. The left ventricle has no  regional wall motion abnormalities. There is mild left ventricular  hypertrophy. Left ventricular diastolic  parameters are consistent with Grade I diastolic dysfunction (impaired  relaxation).   2. Right ventricular systolic function is normal. The right ventricular  size is normal.   3. The mitral valve is normal in structure. No evidence of mitral valve  regurgitation. No evidence of mitral stenosis.   4. The aortic valve is tricuspid. Aortic valve regurgitation is mild.  Mild aortic valve stenosis. Aortic valve area, by VTI measures 1.91 cm.  Aortic valve mean gradient measures 10.0 mmHg. Aortic valve Vmax measures  2.12 m/s.   5. The inferior vena cava is normal in size with greater than 50%  respiratory variability, suggesting right atrial pressure of 3 mmHg.   EKG:   09/12/20: Normal sinus rhythm, rate 82, poor R wave progression 06/22/2020: NSR, rate 94 bpm, poor R wave progression 12/30/2018: normal sinus rhythm, rate 73, low voltage in precordial leads, no ST/T abnormalities  Recent Labs: 03/28/2021: TSH 3.50 05/27/2021: ALT 18; BUN 26; Creatinine 1.2; Hemoglobin 11.6;  Platelets 236; Potassium 4.3; Sodium 22  Recent Lipid Panel No results found for: "CHOL", "TRIG", "HDL", "CHOLHDL", "VLDL", "LDLCALC", "LDLDIRECT"  Physical Exam:    VS:  BP 122/72   Pulse 82   Ht '5\' 3"'$  (1.6 m)   Wt 200 lb (90.7 kg)   SpO2 97%   BMI 35.43 kg/m     Wt Readings from Last 3 Encounters:  09/12/21 200 lb (90.7 kg)  09/11/21 199 lb 9.6 oz (90.5 kg)  09/02/21 199 lb 9.6 oz (90.5 kg)     GEN:  in no acute distress HEENT: Normal NECK: No JVD CARDIAC: RRR, 2/6 systolic murmur RESPIRATORY:  Clear to auscultation without rales, wheezing or rhonchi  ABDOMEN: Soft, non-tender, non-distended MUSCULOSKELETAL: No edema SKIN: Warm and dry NEUROLOGIC:  Alert and oriented x 3 PSYCHIATRIC:  Normal affect   ASSESSMENT:    1. Right leg swelling   2. Paroxysmal atrial fibrillation (HCC)   3. Aortic valve stenosis, etiology of cardiac valve disease unspecified   4. Essential hypertension     PLAN:    Paroxysmal atrial fibrillation: 30-day monitor on 06/04/2020 showed 4% A. fib/flutter burden with average rate 134 bpm and one 7 beat run of NSVT.  CHA2DS2-VASc score 4 (hypertension, age x2, female).  Was not started on anticoagulation due to frequent falls. -She reports improvement in her lightheadedness and has had no recent falls.  Discussed risk/benefits of anticoagulation with her and her son.  She is adamant that she does not want to be on anticoagulation, as her husband died from an intracranial hemorrhage after a fall while on Plavix -Discussed starting diltiazem as needed for rate control should she have A. fib, but she wishes to hold off on any medications, which is reasonable as not sure that she would tolerate diltiazem due to orthostatic hypotension  Aortic stenosis: Mild on echo 04/06/2020 (mean gradient 10 mmHg, AVA 1.9 cm).  Will monitor   Asymmetric RLE edema: 1+ right lower extremity edema on exam today, trace on left.  Will check lower extremity duplex to rule  out DVT.  She had labs drawn at her facility today, will obtain records  Hypertension: Previously was on nadolol 20 mg daily, amlodipine 5 mg daily, lisinopril 20 mg daily.  All BP medicines have been held due to orthostatic hypotension and she was started on sodium tabs.  Symptoms have improved and she is normotensive today.    RTC in 6 months  Medication Adjustments/Labs and Tests Ordered: Current medicines are reviewed at length with the patient today.  Concerns regarding medicines are outlined above.  Orders Placed This Encounter  Procedures   EKG 12-Lead   VAS [place] LOWER EXTREMITY VENOUS (DVT)   No orders of the defined types were placed in this encounter.   Patient Instructions  Medication Instructions:  Your physician recommends that you continue on your current medications as directed. Please refer to the Current Medication list given to you today.  *If you need a refill on your cardiac medications before your next appointment, please call your pharmacy*   Lab Work: None If you have labs (blood work) drawn today and your tests are completely normal, you will receive your results only by: [place] (if you have MyChart) OR A paper copy in the mail If you have any lab test that is abnormal or we need to change your treatment, we will call you to review the results.   Testing/Procedures: Your physician has requested that you have a lower extremity venous duplex. This test is an ultrasound of the veins in the legs . It looks at venous blood flow that carries blood from the heart to the legs.  Allow one hour for a Lower Venous exam. Allow thirty minutes for an Upper Venous exam. There are no restrictions or special instructions.   Follow-Up: At [facility], you and your health needs are our priority.  As part of our continuing mission to provide you with exceptional heart care, we have created designated Provider Care Teams.  These Care Teams include your primary  Cardiologist (physician) and Advanced Practice Providers (APPs -  Physician Assistants and Nurse Practitioners) who all work together to provide you with the care you need, when you need it.  We recommend signing up for the patient portal called "MyChart".  Sign up information is provided on this After Visit Summary.  MyChart is used to connect with patients for Virtual Visits (Telemedicine).  Patients are able to view lab/test results, encounter notes, upcoming appointments, etc.  Non-urgent messages can be sent to your provider as well.   To learn more about what you can do with MyChart, go to [email or web address].    Your next appointment:   6 month(s)  The format for your next appointment:   In Person  Provider:   Donato Heinz, MD         Signed, Donato Heinz, MD  09/12/2021 11:35 PM    [place]

## 2021-09-11 ENCOUNTER — Non-Acute Institutional Stay: Payer: Medicare Other | Admitting: Orthopedic Surgery

## 2021-09-11 ENCOUNTER — Encounter: Payer: Self-pay | Admitting: Orthopedic Surgery

## 2021-09-11 DIAGNOSIS — E871 Hypo-osmolality and hyponatremia: Secondary | ICD-10-CM | POA: Diagnosis not present

## 2021-09-11 DIAGNOSIS — M25472 Effusion, left ankle: Secondary | ICD-10-CM

## 2021-09-11 DIAGNOSIS — I10 Essential (primary) hypertension: Secondary | ICD-10-CM

## 2021-09-11 DIAGNOSIS — E039 Hypothyroidism, unspecified: Secondary | ICD-10-CM

## 2021-09-11 DIAGNOSIS — E669 Obesity, unspecified: Secondary | ICD-10-CM

## 2021-09-11 DIAGNOSIS — R4189 Other symptoms and signs involving cognitive functions and awareness: Secondary | ICD-10-CM | POA: Diagnosis not present

## 2021-09-11 DIAGNOSIS — M25471 Effusion, right ankle: Secondary | ICD-10-CM

## 2021-09-11 DIAGNOSIS — I4892 Unspecified atrial flutter: Secondary | ICD-10-CM | POA: Diagnosis not present

## 2021-09-11 NOTE — Progress Notes (Signed)
Location:  [place] Room Number: NO/10/A Place of Service:  ALF [id number]) Provider:  Windell Moulding, NP Virgie Dad, MD  Patient Care Team: Virgie Dad, MD as PCP - General (Internal Medicine) Donato Heinz, MD as PCP - Cardiology (Cardiology) Brien Few, MD as Consulting Physician (Obstetrics and Gynecology) Marti Sleigh, MD as Consulting Physician (Gynecology) Nancy Marus, MD as Consulting Physician (Gynecologic Oncology)  Extended Emergency Contact Information Primary Emergency Contact: [facility] Address: [address]          [place], [place] [address] [name] of [place] Phone: [phone] Mobile Phone: [phone] Relation: Daughter Secondary Emergency Contact: [place] Mobile Phone: [phone] Relation: Son  Code Status:  FULL Goals of care: Advanced Directive information    09/02/2021   11:41 AM  Advanced Directives  Does Patient Have a Medical Advance Directive? Yes  Type of Paramedic of [place];Living will  Does patient want to make changes to medical advance directive? No - Patient declined  Copy of [place] in Chart? Yes - validated most recent copy scanned in chart (See row information)     Chief Complaint  Patient presents with   Acute Visit    Patient is being seen for increased confusion    HPI:  Pt is a 88 y.o. female seen today for an acute visit due to increased confusion.   She currently resides on the assisted living unit at [facility]. PMH: atherosclerosis of abdominal aorta, HTN, atrial flutter, orthostatic hypotension,syncope, GERD, constipation, ovarian cancer, hyponatremia, cognitive impairment, OA, CKD III, anemia, obesity and insomnia.   In the past month, nursing reports she is needing more cues in order to perform ADLs. She is having to be reminded to use bathroom. She is not notifying staff when she has a  incontinent episode. In addition, PT reports she is needing more direction during therapy sessions. Today, she is able to answer my questions without difficulty. She does ask me to repeat myself often. No behaviors of agitation reported.   No recent falls or injuries. Ambulates with PWC.   Cognitive impairment- 2022 CT head noted white mater tracts of supratentorial brain/ microvascular disease changes HTN- BUN/creat 26/1.18 05/27/2021, not on scheduled medications at this time due to orthostatic hypotension, hydralazine prn for SBP> 170 Hyponatremia- Na 137 05/27/2021, remains on sodium tablets Atrial flutter-  followed by cardiology, revealed on 30-day monitor 06/04/2020, not on anticoagulation due to falls Hypothyroidism- TSH 3.50 03/28/2021, remains on levothyroxine Obesity- BMI 35.36, current weight 199 lbs- unchanged from 1 month ago Ankle edema- increased edema noted 07/24, resolved with furosemide 20 mg x 2 days, wearing ted hose, sit in recliner often  Past Medical History:  Diagnosis Date   Anemia associated with acute blood loss 04/03/2011   Arthritis    knees    Ascites    Blood transfusion    hx of 2951    Complication of anesthesia    Sodium drops per pt    Cystitis    DIARRHEA, ANTIBIOTIC ASSOCIATED 05/31/2009   Qualifier: Diagnosis of  By: Tommy Medal MD, Cornelius     GERD (gastroesophageal reflux disease)    H/O hiatal hernia    Hyperlipidemia    Hypertension    Hyponatremia    Neutropenia with fever ([place]) 05/18/2011   OSTEOMYELITIS, CHRONIC, LOWER LEG 04/23/2009   Qualifier: Diagnosis of  By: Johnnye Sima MD, Felecia Shelling    OSTEOPOROSIS 04/23/2009  Qualifier: Diagnosis of  By: Johnnye Sima MD, Jeffrey     Ovarian cancer [facility]) 01/25/2011   Pneumonia    hx of    Postmenopausal atrophic vaginitis    PROSTHETIC JOINT COMPLICATION 4/00/8676   Qualifier: Diagnosis of  By: Tommy Medal MD, Cornelius     Renal disorder    Decreased kidney function   Staph infection 2010    after knee replacement   Urinary frequency    Vulvitis    Past Surgical History:  Procedure Laterality Date   ABDOMINAL HYSTERECTOMY  04/01/2011   Procedure: HYSTERECTOMY ABDOMINAL;  Surgeon: Alvino Chapel, MD;  Location: [facility];  Service: Gynecology;  Laterality: N/A;   APPENDECTOMY     JOINT REPLACEMENT     R knee in 2008, 5 operations on L knee   LAPAROTOMY  04/01/2011   Procedure: EXPLORATORY LAPAROTOMY;  Surgeon: Alvino Chapel, MD;  Location: [facility];  Service: Gynecology;  Laterality: N/A;   OTHER SURGICAL HISTORY     hx of C section 1966   SALPINGOOPHORECTOMY  04/01/2011   Procedure: SALPINGO OOPHERECTOMY;  Surgeon: Alvino Chapel, MD;  Location: [facility];  Service: Gynecology;  Laterality: Bilateral;   TONSILLECTOMY      Allergies  Allergen Reactions   Morphine And Related Anaphylaxis and Other (See Comments)    Given after knee replacement and code blue occurred   Morphine Sulfate Anaphylaxis   Penicillins Hives, Itching and Other (See Comments)    Syncope, also   Nsaids Other (See Comments)    Because of an abnormal kidney test result   Penicillin V Hives   Shellfish Allergy Nausea And Vomiting and Other (See Comments)    Pt has shellfish allergy only.  Has had IV contrast x 2 and did fine.   Shellfish-Derived Products Nausea And Vomiting    Outpatient Encounter Medications as of 09/11/2021  Medication Sig   acetaminophen (TYLENOL) 500 MG tablet Take 1,000 mg by mouth every 8 (eight) hours as needed.   azelastine (ASTELIN) 0.1 % nasal spray Place 1 spray into both nostrils daily as needed for rhinitis or allergies.   B Complex-C-Folic Acid (B-COMPLEX/FOLIC ACID/VITAMIN C PO) Take 1 tablet by mouth daily with breakfast.   bimatoprost (LUMIGAN) 0.03 % ophthalmic solution Place 1 drop into both eyes at bedtime.   calcium carbonate (TUMS EX) 750 MG chewable tablet Chew 750 mg by mouth daily as needed for heartburn.   Cholecalciferol (VITAMIN D3) 50  MCG (2000 UT) TABS Take 2,000 Units by mouth in the morning.   cycloSPORINE (RESTASIS) 0.05 % ophthalmic emulsion Place 1 drop into both eyes 2 (two) times daily.   diclofenac Sodium (VOLTAREN) 1 % GEL Apply 2 g topically in the morning and at bedtime.   docusate sodium (COLACE) 100 MG capsule Take 100 mg by mouth daily as needed for mild constipation.   hydrALAZINE (APRESOLINE) 10 MG tablet Take 10 mg by mouth 2 (two) times daily as needed.   levothyroxine (SYNTHROID) 25 MCG tablet Take 25 mcg by mouth daily before breakfast.   meclizine (ANTIVERT) 12.5 MG tablet Take 1 tablet (12.5 mg total) by mouth 2 (two) times daily as needed for dizziness.   melatonin 3 MG TABS tablet Take 1 tablet (3 mg total) by mouth at bedtime.   miconazole (MICOTIN) 2 % powder Apply topically daily. Patient may apply to dry skin under breasts daily and after showering   nystatin (MYCOSTATIN/NYSTOP) powder Apply 1 application topically 2 (two) times daily as  needed.   pantoprazole (PROTONIX) 40 MG tablet Take 1 tablet (40 mg total) by mouth 2 (two) times daily.   sodium chloride 1 g tablet Take 1 g by mouth 2 (two) times daily.   zinc oxide 20 % ointment Apply 1 application topically as needed (for redness- to be applied to buttocks/peri area and after every incontinent episode).   No facility-administered encounter medications on file as of 09/11/2021.    Review of Systems  Constitutional:  Negative for activity change, appetite change, chills, fatigue and fever.  HENT:  Positive for hearing loss. Negative for congestion and trouble swallowing.   Eyes:  Negative for visual disturbance.  Respiratory:  Negative for cough, shortness of breath and wheezing.   Cardiovascular:  Negative for chest pain and leg swelling.  Gastrointestinal:  Negative for abdominal distention, abdominal pain, constipation, diarrhea, nausea and vomiting.  Genitourinary:  Negative for dysuria, frequency and hematuria.       Incontinence   Skin:  Negative for wound.  Neurological:  Positive for weakness. Negative for dizziness and headaches.  Psychiatric/Behavioral:  Positive for confusion. Negative for dysphoric mood and sleep disturbance. The patient is not nervous/anxious.     Immunization History  Administered Date(s) Administered   Influenza Split 11/13/2012, 12/13/2012, 11/30/2013, 10/09/2014, 11/24/2018   Influenza, High Dose Seasonal PF 12/01/2013, 10/04/2014, 10/12/2015, 11/06/2016, 11/12/2017   Influenza,inj,Quad PF,6+ Mos 10/24/2010   Influenza-Unspecified 10/24/2010, 11/01/2019, 12/04/2020   Moderna SARS-COV2 Booster Vaccination 07/18/2020   Moderna Sars-Covid-2 Vaccination 02/14/2019, 03/14/2019, 12/26/2019   PFIZER(Purple Top)SARS-COV-2 Vaccination 10/31/2020   Pfizer Covid-19 Vaccine Bivalent Booster 14yr & up 10/31/2020   Pneumococcal Conjugate-13 09/28/2013   Pneumococcal Polysaccharide-23 03/05/2012   Td 08/27/2000   Tdap 08/27/2000, 10/16/2010   Zoster, Live 01/11/2013, 03/31/2020   Pertinent  Health Maintenance Due  Topic Date Due   INFLUENZA VACCINE  09/10/2021   DEXA SCAN  Completed      04/29/2020    7:00 AM 04/29/2020    8:00 PM 04/30/2020    1:00 PM 08/16/2020    3:47 PM 08/23/2021    1:08 PM  Fall Risk  Falls in the past year?    1 1  Was there an injury with Fall?    0 0  Fall Risk Category Calculator    2 1  Fall Risk Category    Moderate Low  Patient Fall Risk Level High fall risk High fall risk High fall risk  Moderate fall risk  Patient at Risk for Falls Due to    History of fall(s);Impaired balance/gait;Impaired mobility;No Fall Risks History of fall(s);Impaired balance/gait;Impaired mobility  Fall risk Follow up    Falls evaluation completed;Education provided;Falls prevention discussed Falls evaluation completed;Education provided;Falls prevention discussed   Functional Status Survey:    Vitals:   09/11/21 1144  BP: (!) 148/83  Pulse: 80  Resp: 20  Temp: (!) 97.3 F (36.3  C)  SpO2: 95%  Weight: 199 lb 9.6 oz (90.5 kg)  Height: '5\' 3"'$  (1.6 m)   Body mass index is 35.36 kg/m. Physical Exam Vitals reviewed.  Constitutional:      General: She is not in acute distress. HENT:     Head: Normocephalic.     Ears:     Comments: HOH    Nose: Nose normal.     Mouth/Throat:     Mouth: Mucous membranes are moist.  Eyes:     General:        Right eye: No discharge.  Left eye: No discharge.  Cardiovascular:     Rate and Rhythm: Normal rate. Rhythm irregular.     Pulses: Normal pulses.     Heart sounds: Normal heart sounds.  Pulmonary:     Effort: Pulmonary effort is normal. No respiratory distress.     Breath sounds: Normal breath sounds. No wheezing.  Abdominal:     General: Bowel sounds are normal. There is no distension.     Palpations: Abdomen is soft.     Tenderness: There is no abdominal tenderness.  Musculoskeletal:     Cervical back: Neck supple.     Right lower leg: No edema.     Left lower leg: No edema.  Skin:    General: Skin is warm and dry.     Capillary Refill: Capillary refill takes less than 2 seconds.  Neurological:     General: No focal deficit present.     Mental Status: She is alert. Mental status is at baseline.     Motor: Weakness present.     Gait: Gait abnormal.     Comments: PWC  Psychiatric:        Mood and Affect: Mood normal.        Behavior: Behavior normal.     Comments: Appropriate today, follows commands, alert to self/person/place/situation     Labs reviewed: Recent Labs    11/19/20 0000 05/27/21 0000 05/27/21 1244  NA 138  --  22*  K 4.6  --  4.3  CL 105  --  105  CO2 27*  --  23*  BUN 34*  --  26*  CREATININE 1.0  --  1.2*  CALCIUM 9.2 9.0  --    Recent Labs    11/19/20 0000 05/27/21 0000 05/27/21 1244  AST 16  --  12*  ALT 9  --  18  ALKPHOS 56  --  63  ALBUMIN 3.5 3.4*  --    Recent Labs    11/19/20 0000 05/27/21 1244  WBC 5.4 5.6  NEUTROABS  --  3,097.00  HGB 11.3* 11.6*   HCT 36 36  PLT 222 236   Lab Results  Component Value Date   TSH 3.50 03/28/2021   No results found for: "HGBA1C" No results found for: "CHOL", "HDL", "LDLCALC", "LDLDIRECT", "TRIG", "CHOLHDL"  Significant Diagnostic Results in last 30 days:  No results found.  Assessment/Plan 1. Cognitive impairment - 2022 CT head noted white mater tracts of supratentorial brain/ microvascular disease changes - needing more cues with ADLs - ? Poor hearing versus impaired cognition - ST evaluation for cognitive testing - cbc/diff - bmp  2. Essential hypertension - controlled without medication  3. Hyponatremia - cont sodium tablets  4. Atrial flutter, unspecified type ([place]) - followed by cardiology - off medications due to falls  5. Acquired hypothyroidism - TSH stable - cont levothyroxine  6. Obesity (BMI 30.0-34.9) - more sedentary  - cont monthly weights  7. Ankle edema, bilateral - resolved with furosemide 20 mg daily x 2 days    Family/ staff Communication: plan discussed with patient and nurse  Labs/tests ordered:  cbc/diff, bmp, ST evaluation

## 2021-09-12 ENCOUNTER — Ambulatory Visit (INDEPENDENT_AMBULATORY_CARE_PROVIDER_SITE_OTHER): Payer: Medicare Other | Admitting: Cardiology

## 2021-09-12 ENCOUNTER — Ambulatory Visit (HOSPITAL_COMMUNITY)
Admission: RE | Admit: 2021-09-12 | Discharge: 2021-09-12 | Disposition: A | Discharge status: Home / Self Care | Payer: Medicare Other | Source: Ambulatory Visit | Attending: Cardiovascular Disease | Admitting: Cardiovascular Disease

## 2021-09-12 ENCOUNTER — Encounter: Payer: Self-pay | Admitting: Cardiology

## 2021-09-12 VITALS — BP 122/72 | HR 82 | Ht 63.0 in | Wt 200.0 lb

## 2021-09-12 DIAGNOSIS — I350 Nonrheumatic aortic (valve) stenosis: Secondary | ICD-10-CM | POA: Diagnosis not present

## 2021-09-12 DIAGNOSIS — M7989 Other specified soft tissue disorders: Secondary | ICD-10-CM

## 2021-09-12 DIAGNOSIS — I480 Paroxysmal atrial fibrillation: Secondary | ICD-10-CM

## 2021-09-12 DIAGNOSIS — I10 Essential (primary) hypertension: Secondary | ICD-10-CM | POA: Diagnosis not present

## 2021-09-12 LAB — COMPREHENSIVE METABOLIC PANEL
Calcium: 9.5 (ref 8.7–10.7)
eGFR: 46

## 2021-09-12 LAB — CBC AND DIFFERENTIAL
HCT: 43 (ref 36–46)
Hemoglobin: 13.9 (ref 12.0–16.0)
Neutrophils Absolute: 4381
Platelets: 212 10*3/uL (ref 150–400)
WBC: 6.5

## 2021-09-12 LAB — BASIC METABOLIC PANEL
BUN: 27 — AB (ref 4–21)
CO2: 26 — AB (ref 13–22)
Chloride: 103 (ref 99–108)
Glucose: 118
Potassium: 4.4 mEq/L (ref 3.5–5.1)
Sodium: 139 (ref 137–147)

## 2021-09-12 LAB — CBC: RBC: 5.14 — AB (ref 3.87–5.11)

## 2021-09-12 NOTE — Patient Instructions (Addendum)
Medication Instructions:  Your physician recommends that you continue on your current medications as directed. Please refer to the Current Medication list given to you today.  *If you need a refill on your cardiac medications before your next appointment, please call your pharmacy*   Lab Work: None If you have labs (blood work) drawn today and your tests are completely normal, you will receive your results only by: [facility] (if you have MyChart) OR A paper copy in the mail If you have any lab test that is abnormal or we need to change your treatment, we will call you to review the results.   Testing/Procedures: Your physician has requested that you have a lower extremity venous duplex. This test is an ultrasound of the veins in the legs . It looks at venous blood flow that carries blood from the heart to the legs. Allow one hour for a Lower Venous exam. Allow thirty minutes for an Upper Venous exam. There are no restrictions or special instructions.   Follow-Up: At [facility], you and your health needs are our priority.  As part of our continuing mission to provide you with exceptional heart care, we have created designated Provider Care Teams.  These Care Teams include your primary Cardiologist (physician) and Advanced Practice Providers (APPs -  Physician Assistants and Nurse Practitioners) who all work together to provide you with the care you need, when you need it.  We recommend signing up for the patient portal called "MyChart".  Sign up information is provided on this After Visit Summary.  MyChart is used to connect with patients for Virtual Visits (Telemedicine).  Patients are able to view lab/test results, encounter notes, upcoming appointments, etc.  Non-urgent messages can be sent to your provider as well.   To learn more about what you can do with MyChart, go to [email or web address].    Your next appointment:   6 month(s)  The format for your next appointment:    In Person  Provider:   Donato Heinz, MD

## 2021-09-16 ENCOUNTER — Encounter: Payer: Self-pay | Admitting: Orthopedic Surgery

## 2021-09-16 ENCOUNTER — Non-Acute Institutional Stay: Payer: Medicare Other | Admitting: Orthopedic Surgery

## 2021-09-16 DIAGNOSIS — M25472 Effusion, left ankle: Secondary | ICD-10-CM | POA: Diagnosis not present

## 2021-09-16 DIAGNOSIS — R300 Dysuria: Secondary | ICD-10-CM | POA: Diagnosis not present

## 2021-09-16 DIAGNOSIS — M25471 Effusion, right ankle: Secondary | ICD-10-CM

## 2021-09-16 DIAGNOSIS — R4189 Other symptoms and signs involving cognitive functions and awareness: Secondary | ICD-10-CM

## 2021-09-16 NOTE — Progress Notes (Signed)
Location:   [facility] Room Number: [place] of Service:  ALF [phone]) Provider:  Windell Moulding, NP  PCP: Virgie Dad, MD  Patient Care Team: Virgie Dad, MD as PCP - General (Internal Medicine) Donato Heinz, MD as PCP - Cardiology (Cardiology) Brien Few, MD as Consulting Physician (Obstetrics and Gynecology) Marti Sleigh, MD as Consulting Physician (Gynecology) Nancy Marus, MD as Consulting Physician (Gynecologic Oncology)  Extended Emergency Contact Information Primary Emergency Contact: [facility] Address: [address]          [place], [place] [address] [name] of [place] Phone: [phone] Mobile Phone: [phone] Relation: Daughter Secondary Emergency Contact: [place] Mobile Phone: [phone] Relation: Son  Code Status:  FULL CODE Goals of care: Advanced Directive information    09/16/2021   10:10 AM  Advanced Directives  Does Patient Have a Medical Advance Directive? Yes  Type of Paramedic of [place];Living will  Does patient want to make changes to medical advance directive? No - Patient declined  Copy of [place] in Chart? Yes - validated most recent copy scanned in chart (See row information)     Chief Complaint  Patient presents with   Acute Visit    Increased Confusion.     HPI:  Pt is a 88 y.o. female seen today for an acute visit for increased confusion.   08/06 she was observed shutting all her lights off and covering herself with chucks in afternoon. Nursing staff states she was soiled and not wanting to perform peri-care and change her pad. No agitation during incident. She was easily redirected. She was seen last week due to odd behaviors. Needs more direction to perform ADLs. Cognitive testing with ST ordered. WBC 6.5, hgb 13.9, Na+139, BUN/creat 27/1.14, K+4.4 09/12/2021.   Hearing aid evaluation 08/04 per daughter.   Today,  she is sitting in recliner. She does not remember incident yesterday. She reports increased dysuria x 2 days. Admits to not drinking a lot of water due to fluid restriction. Requesting to have her urine checked today. Afebrile, vitals stable.   08/03 seen by cardiology, RLE edema noted. 08/04 lower extremity duplex performed, negative for DVT. She denies chest pain, sob and leg swelling today.   Past Medical History:  Diagnosis Date   Anemia associated with acute blood loss 04/03/2011   Arthritis    knees    Ascites    Blood transfusion    hx of [id number]    Complication of anesthesia    Sodium drops per pt    Cystitis    DIARRHEA, ANTIBIOTIC ASSOCIATED 05/31/2009   Qualifier: Diagnosis of  By: Tommy Medal MD, Cornelius     GERD (gastroesophageal reflux disease)    H/O hiatal hernia    Hyperlipidemia    Hypertension    Hyponatremia    Neutropenia with fever ([place]) 05/18/2011   OSTEOMYELITIS, CHRONIC, LOWER LEG 04/23/2009   Qualifier: Diagnosis of  By: Johnnye Sima MD, Felecia Shelling    OSTEOPOROSIS 04/23/2009   Qualifier: Diagnosis of  By: Johnnye Sima MD, Jeffrey     Ovarian cancer ([place]) 01/25/2011   Pneumonia    hx of    Postmenopausal atrophic vaginitis    PROSTHETIC JOINT COMPLICATION 05/29/3788   Qualifier: Diagnosis of  By: Tommy Medal MD, Cornelius     Renal disorder    Decreased kidney function   Staph infection 2010   after knee replacement   Urinary frequency  Vulvitis    Past Surgical History:  Procedure Laterality Date   ABDOMINAL HYSTERECTOMY  04/01/2011   Procedure: HYSTERECTOMY ABDOMINAL;  Surgeon: Alvino Chapel, MD;  Location: [facility];  Service: Gynecology;  Laterality: N/A;   APPENDECTOMY     JOINT REPLACEMENT     R knee in 2008, 5 operations on L knee   LAPAROTOMY  04/01/2011   Procedure: EXPLORATORY LAPAROTOMY;  Surgeon: Alvino Chapel, MD;  Location: [facility];  Service: Gynecology;  Laterality: N/A;   OTHER SURGICAL HISTORY     hx of C section 1966    SALPINGOOPHORECTOMY  04/01/2011   Procedure: SALPINGO OOPHERECTOMY;  Surgeon: Alvino Chapel, MD;  Location: [facility];  Service: Gynecology;  Laterality: Bilateral;   TONSILLECTOMY      Allergies  Allergen Reactions   Morphine And Related Anaphylaxis and Other (See Comments)    Given after knee replacement and code blue occurred   Morphine Sulfate Anaphylaxis   Penicillins Hives, Itching and Other (See Comments)    Syncope, also   Nsaids Other (See Comments)    Because of an abnormal kidney test result   Penicillin V Hives   Shellfish Allergy Nausea And Vomiting and Other (See Comments)    Pt has shellfish allergy only.  Has had IV contrast x 2 and did fine.   Shellfish-Derived Products Nausea And Vomiting    Allergies as of 09/16/2021       Reactions   Morphine And Related Anaphylaxis, Other (See Comments)   Given after knee replacement and code blue occurred   Morphine Sulfate Anaphylaxis   Penicillins Hives, Itching, Other (See Comments)   Syncope, also   Nsaids Other (See Comments)   Because of an abnormal kidney test result   Penicillin V Hives   Shellfish Allergy Nausea And Vomiting, Other (See Comments)   Pt has shellfish allergy only.  Has had IV contrast x 2 and did fine.   Shellfish-derived Products Nausea And Vomiting        Medication List        Accurate as of September 16, 2021 10:10 AM. If you have any questions, ask your nurse or doctor.          STOP taking these medications    furosemide 20 MG tablet Commonly known as: LASIX Stopped by: Yvonna Alanis, NP       TAKE these medications    acetaminophen 500 MG tablet Commonly known as: TYLENOL Take 1,000 mg by mouth every 8 (eight) hours as needed.   azelastine 0.1 % nasal spray Commonly known as: ASTELIN Place 1 spray into both nostrils daily as needed for rhinitis or allergies.   B-COMPLEX/FOLIC ACID/VITAMIN C PO Take 1 tablet by mouth daily with breakfast.   bimatoprost 0.03 %  ophthalmic solution Commonly known as: LUMIGAN Place 1 drop into both eyes at bedtime.   calcium carbonate 750 MG chewable tablet Commonly known as: TUMS EX Chew 750 mg by mouth daily as needed for heartburn.   cycloSPORINE 0.05 % ophthalmic emulsion Commonly known as: RESTASIS Place 1 drop into both eyes 2 (two) times daily.   diclofenac Sodium 1 % Gel Commonly known as: VOLTAREN Apply 2 g topically in the morning and at bedtime.   docusate sodium 100 MG capsule Commonly known as: COLACE Take 100 mg by mouth daily.   hydrALAZINE 10 MG tablet Commonly known as: APRESOLINE Take 10 mg by mouth 2 (two) times daily as needed.   levothyroxine 25  MCG tablet Commonly known as: SYNTHROID Take 25 mcg by mouth daily before breakfast.   meclizine 12.5 MG tablet Commonly known as: ANTIVERT Take 1 tablet (12.5 mg total) by mouth 2 (two) times daily as needed for dizziness.   melatonin 3 MG Tabs tablet Take 1 tablet (3 mg total) by mouth at bedtime.   miconazole 2 % powder Commonly known as: MICOTIN Apply topically daily. Patient may apply to dry skin under breasts daily and after showering   nystatin powder Commonly known as: MYCOSTATIN/NYSTOP Apply 1 application topically 2 (two) times daily as needed.   pantoprazole 40 MG tablet Commonly known as: PROTONIX Take 1 tablet (40 mg total) by mouth 2 (two) times daily.   sodium chloride 1 g tablet Take 1 g by mouth 2 (two) times daily.   Vitamin D3 50 MCG (2000 UT) Tabs Take 2,000 Units by mouth in the morning.   zinc oxide 20 % ointment Apply 1 application topically as needed (for redness- to be applied to buttocks/peri area and after every incontinent episode).        Review of Systems  Constitutional:  Negative for activity change, appetite change, fatigue and fever.  HENT:  Negative for sore throat.   Eyes:  Negative for visual disturbance.  Respiratory:  Negative for cough, shortness of breath and wheezing.    Cardiovascular:  Negative for chest pain and leg swelling.  Gastrointestinal:  Negative for abdominal distention, abdominal pain, constipation, diarrhea, nausea and vomiting.  Genitourinary:  Positive for dysuria. Negative for frequency and hematuria.  Musculoskeletal:  Positive for gait problem.  Skin:  Negative for wound.  Neurological:  Positive for weakness. Negative for dizziness and headaches.  Psychiatric/Behavioral:  Positive for confusion. Negative for agitation, dysphoric mood and sleep disturbance. The patient is not nervous/anxious.     Immunization History  Administered Date(s) Administered   Influenza Split 11/13/2012, 12/13/2012, 11/30/2013, 10/09/2014, 11/24/2018   Influenza, High Dose Seasonal PF 12/01/2013, 10/04/2014, 10/12/2015, 11/06/2016, 11/12/2017   Influenza,inj,Quad PF,6+ Mos 10/24/2010   Influenza-Unspecified 10/24/2010, 11/01/2019, 12/04/2020   Moderna SARS-COV2 Booster Vaccination 07/18/2020   Moderna Sars-Covid-2 Vaccination 02/14/2019, 03/14/2019, 12/26/2019   PFIZER(Purple Top)SARS-COV-2 Vaccination 10/31/2020   Pfizer Covid-19 Vaccine Bivalent Booster 70yr & up 10/31/2020   Pneumococcal Conjugate-13 09/28/2013   Pneumococcal Polysaccharide-23 03/05/2012   Td 08/27/2000   Tdap 08/27/2000, 10/16/2010   Zoster, Live 01/11/2013, 03/31/2020   Pertinent  Health Maintenance Due  Topic Date Due   INFLUENZA VACCINE  09/10/2021   DEXA SCAN  Completed      04/29/2020    7:00 AM 04/29/2020    8:00 PM 04/30/2020    1:00 PM 08/16/2020    3:47 PM 08/23/2021    1:08 PM  Fall Risk  Falls in the past year?    1 1  Was there an injury with Fall?    0 0  Fall Risk Category Calculator    2 1  Fall Risk Category    Moderate Low  Patient Fall Risk Level High fall risk High fall risk High fall risk  Moderate fall risk  Patient at Risk for Falls Due to    History of fall(s);Impaired balance/gait;Impaired mobility;No Fall Risks History of fall(s);Impaired  balance/gait;Impaired mobility  Fall risk Follow up    Falls evaluation completed;Education provided;Falls prevention discussed Falls evaluation completed;Education provided;Falls prevention discussed   Functional Status Survey:    Vitals:   09/16/21 1000  BP: 120/87  Pulse: 76  Resp: 20  Temp: 97.9 F (  36.6 C)  SpO2: 96%  Weight: 198 lb 6.4 oz (90 kg)  Height: '5\' 3"'$  (1.6 m)   Body mass index is 35.14 kg/m. Physical Exam Vitals reviewed.  Constitutional:      General: She is not in acute distress. HENT:     Head: Normocephalic.     Right Ear: Tympanic membrane normal.     Left Ear: Tympanic membrane normal.     Ears:     Comments: Bilateral hearing aids    Nose: Nose normal.     Mouth/Throat:     Mouth: Mucous membranes are moist.  Eyes:     General:        Right eye: No discharge.        Left eye: No discharge.  Cardiovascular:     Rate and Rhythm: Normal rate. Rhythm irregular.     Pulses: Normal pulses.     Heart sounds: Normal heart sounds.  Pulmonary:     Effort: Pulmonary effort is normal. No respiratory distress.     Breath sounds: Normal breath sounds. No wheezing.  Abdominal:     General: Bowel sounds are normal. There is no distension.     Palpations: Abdomen is soft.     Tenderness: There is no abdominal tenderness.  Musculoskeletal:     Cervical back: Neck supple.     Right lower leg: Edema present.     Left lower leg: Edema present.     Comments: Non pitting  Skin:    General: Skin is warm and dry.     Capillary Refill: Capillary refill takes less than 2 seconds.  Neurological:     General: No focal deficit present.     Mental Status: She is alert. Mental status is at baseline.     Motor: Weakness present.     Gait: Gait abnormal.     Comments: Walker/PWC  Psychiatric:        Mood and Affect: Mood normal.        Behavior: Behavior normal.        Cognition and Memory: Memory is impaired.     Labs reviewed: Recent Labs    11/19/20 0000  05/27/21 0000 05/27/21 1244  NA 138  --  22*  K 4.6  --  4.3  CL 105  --  105  CO2 27*  --  23*  BUN 34*  --  26*  CREATININE 1.0  --  1.2*  CALCIUM 9.2 9.0  --    Recent Labs    11/19/20 0000 05/27/21 0000 05/27/21 1244  AST 16  --  12*  ALT 9  --  18  ALKPHOS 56  --  63  ALBUMIN 3.5 3.4*  --    Recent Labs    11/19/20 0000 05/27/21 1244  WBC 5.4 5.6  NEUTROABS  --  3,097.00  HGB 11.3* 11.6*  HCT 36 36  PLT 222 236   Lab Results  Component Value Date   TSH 3.50 03/28/2021   No results found for: "HGBA1C" No results found for: "CHOL", "HDL", "LDLCALC", "LDLDIRECT", "TRIG", "CHOLHDL"  Significant Diagnostic Results in last 30 days:  VAS [place] LOWER EXTREMITY VENOUS (DVT)  Result Date: 09/13/2021  Lower Venous DVT Study Patient Name:  [name]  Date of Exam:   09/12/2021 Medical Rec #: [id number]         Accession #:    [id number] Date of Birth: 18-May-1929         Patient Gender:  F Patient Age:   38 years Exam Location:  [facility] Procedure:      VAS [place] LOWER EXTREMITY VENOUS (DVT) Referring Phys: Oswaldo Milian --------------------------------------------------------------------------------  Indications: Swelling/edema of the right lower extremity.  Performing Technologist: Mariane Masters RVT  Examination Guidelines: A complete evaluation includes B-mode imaging, spectral Doppler, color Doppler, and power Doppler as needed of all accessible portions of each vessel. Bilateral testing is considered an integral part of a complete examination. Limited examinations for reoccurring indications may be performed as noted. The reflux portion of the exam is performed with the patient in reverse Trendelenburg.  +---------+---------------+---------+-----------+----------+--------------+ RIGHT    CompressibilityPhasicitySpontaneityPropertiesThrombus Aging +---------+---------------+---------+-----------+----------+--------------+ CFV      Full           Yes      Yes                                  +---------+---------------+---------+-----------+----------+--------------+ SFJ      Full           Yes      Yes                                 +---------+---------------+---------+-----------+----------+--------------+ FV Prox  Full           Yes      Yes                                 +---------+---------------+---------+-----------+----------+--------------+ FV Mid   Full           Yes      Yes                                 +---------+---------------+---------+-----------+----------+--------------+ FV DistalFull           Yes      Yes                                 +---------+---------------+---------+-----------+----------+--------------+ PFV      Full                                                        +---------+---------------+---------+-----------+----------+--------------+ POP      Full           Yes      Yes                                 +---------+---------------+---------+-----------+----------+--------------+ PTV      Full           Yes      Yes                                 +---------+---------------+---------+-----------+----------+--------------+ PERO     Full           Yes      Yes                                 +---------+---------------+---------+-----------+----------+--------------+  Gastroc  Full                                                        +---------+---------------+---------+-----------+----------+--------------+ GSV      Full           Yes      Yes                                 +---------+---------------+---------+-----------+----------+--------------+   +----+---------------+---------+-----------+----------+--------------+ LEFTCompressibilityPhasicitySpontaneityPropertiesThrombus Aging +----+---------------+---------+-----------+----------+--------------+ CFV Full           Yes      Yes                                  +----+---------------+---------+-----------+----------+--------------+     Summary: RIGHT: - No evidence of deep vein thrombosis in the lower extremity. No indirect evidence of obstruction proximal to the inguinal ligament. - No cystic structure found in the popliteal fossa.  LEFT: - No evidence of common femoral vein obstruction.  *See table(s) above for measurements and observations. Electronically signed by Larae Grooms MD on 09/13/2021 at 9:40:35 PM.    Final     Assessment/Plan 1. Cognitive impairment -  2022 CT head noted white mater tracts of supratentorial brain/ microvascular disease changes - 08/06 odd behavior noted - needs more direction with ADLs in past month - cognitive testing with ST- pending - labs unremarkable, Na+ normal - hearing aids repaired 08/04  2. Dysuria - onset 2 days ago - h/o incontinence - not changing pads often per nursing - UA/culture  3. Ankle edema, bilateral - cardiology noted RLE  - H/o atrial flutter, off anticoagulation due to falls - 08/04 venous doppler both extremities negative for DVT - BLE non pitting edema  - cont ted hose  - cont leg elevation    Family/ staff Communication: plan discussed with patient and nurse  Labs/tests ordered:   UA/culture

## 2021-09-19 ENCOUNTER — Non-Acute Institutional Stay: Payer: Medicare Other | Admitting: Internal Medicine

## 2021-09-19 ENCOUNTER — Encounter: Payer: Self-pay | Admitting: Internal Medicine

## 2021-09-19 DIAGNOSIS — E871 Hypo-osmolality and hyponatremia: Secondary | ICD-10-CM

## 2021-09-19 DIAGNOSIS — R4189 Other symptoms and signs involving cognitive functions and awareness: Secondary | ICD-10-CM

## 2021-09-19 DIAGNOSIS — M25471 Effusion, right ankle: Secondary | ICD-10-CM | POA: Diagnosis not present

## 2021-09-19 DIAGNOSIS — I10 Essential (primary) hypertension: Secondary | ICD-10-CM

## 2021-09-19 DIAGNOSIS — N3000 Acute cystitis without hematuria: Secondary | ICD-10-CM

## 2021-09-19 DIAGNOSIS — K219 Gastro-esophageal reflux disease without esophagitis: Secondary | ICD-10-CM

## 2021-09-19 DIAGNOSIS — I4892 Unspecified atrial flutter: Secondary | ICD-10-CM | POA: Diagnosis not present

## 2021-09-19 DIAGNOSIS — M25472 Effusion, left ankle: Secondary | ICD-10-CM

## 2021-09-19 DIAGNOSIS — E039 Hypothyroidism, unspecified: Secondary | ICD-10-CM

## 2021-09-19 NOTE — Progress Notes (Signed)
Location: [place] of Service:  SNF (31)  Provider:   Code Status: DNR Goals of Care:     09/16/2021   10:10 AM  Advanced Directives  Does Patient Have a Medical Advance Directive? Yes  Type of Paramedic of [place];Living will  Does patient want to make changes to medical advance directive? No - Patient declined  Copy of [place] in Chart? Yes - validated most recent copy scanned in chart (See row information)     Chief Complaint  Patient presents with   Acute Visit    HPI: Patient is a 88 y.o. female seen today for an acute visit for UTI and Impaired cognition  Patient has h/o Hypertension,Hyponatremia, Syncope, Cognitive impairment, HLD, Atrial Flutter not on Anticoagulation due to Falls  Patient was seen today because recently has had acute issues with her cognitions over the past few months.  Noticed by nurses and her family decline.  Recently patient is also having issues with frequency and urinary incontinence No fever or chills I discussed with both speech and OT and her daughter Patient also continues to have issues with her ADLs.  The staff is suggesting to move her to a skilled.  She is having hard time dressing and getting to the bathroom on time.  Getting confused about her appointments. No behavior issues no fall   Past Medical History:  Diagnosis Date   Anemia associated with acute blood loss 04/03/2011   Arthritis    knees    Ascites    Blood transfusion    hx of 2202    Complication of anesthesia    Sodium drops per pt    Cystitis    DIARRHEA, ANTIBIOTIC ASSOCIATED 05/31/2009   Qualifier: Diagnosis of  By: Tommy Medal MD, Cornelius     GERD (gastroesophageal reflux disease)    H/O hiatal hernia    Hyperlipidemia    Hypertension    Hyponatremia    Neutropenia with fever ([facility]) 05/18/2011   OSTEOMYELITIS, CHRONIC, LOWER LEG 04/23/2009   Qualifier: Diagnosis of  By: Johnnye Sima MD, Felecia Shelling    OSTEOPOROSIS 04/23/2009   Qualifier: Diagnosis of  By: Johnnye Sima MD, Jeffrey     Ovarian cancer ([facility]) 01/25/2011   Pneumonia    hx of    Postmenopausal atrophic vaginitis    PROSTHETIC JOINT COMPLICATION 5/42/7062   Qualifier: Diagnosis of  By: Tommy Medal MD, Cornelius     Renal disorder    Decreased kidney function   Staph infection 2010   after knee replacement   Urinary frequency    Vulvitis     Past Surgical History:  Procedure Laterality Date   ABDOMINAL HYSTERECTOMY  04/01/2011   Procedure: HYSTERECTOMY ABDOMINAL;  Surgeon: Alvino Chapel, MD;  Location: [facility];  Service: Gynecology;  Laterality: N/A;   APPENDECTOMY     JOINT REPLACEMENT     R knee in 2008, 5 operations on L knee   LAPAROTOMY  04/01/2011   Procedure: EXPLORATORY LAPAROTOMY;  Surgeon: Alvino Chapel, MD;  Location: [facility];  Service: Gynecology;  Laterality: N/A;   OTHER SURGICAL HISTORY     hx of C section 1966   SALPINGOOPHORECTOMY  04/01/2011   Procedure: SALPINGO OOPHERECTOMY;  Surgeon: Alvino Chapel, MD;  Location: [facility];  Service: Gynecology;  Laterality: Bilateral;   TONSILLECTOMY      Allergies  Allergen Reactions   Morphine And Related Anaphylaxis  and Other (See Comments)    Given after knee replacement and code blue occurred   Morphine Sulfate Anaphylaxis   Penicillins Hives, Itching and Other (See Comments)    Syncope, also   Nsaids Other (See Comments)    Because of an abnormal kidney test result   Penicillin V Hives   Shellfish Allergy Nausea And Vomiting and Other (See Comments)    Pt has shellfish allergy only.  Has had IV contrast x 2 and did fine.   Shellfish-Derived Products Nausea And Vomiting    Outpatient Encounter Medications as of 09/19/2021  Medication Sig   acetaminophen (TYLENOL) 500 MG tablet Take 1,000 mg by mouth every 8 (eight) hours as needed.   azelastine (ASTELIN) 0.1 % nasal spray Place 1 spray into both nostrils daily as needed  for rhinitis or allergies.   B Complex-C-Folic Acid (B-COMPLEX/FOLIC ACID/VITAMIN C PO) Take 1 tablet by mouth daily with breakfast.   bimatoprost (LUMIGAN) 0.03 % ophthalmic solution Place 1 drop into both eyes at bedtime.   calcium carbonate (TUMS EX) 750 MG chewable tablet Chew 750 mg by mouth daily as needed for heartburn.   Cholecalciferol (VITAMIN D3) 50 MCG (2000 UT) TABS Take 2,000 Units by mouth in the morning.   cycloSPORINE (RESTASIS) 0.05 % ophthalmic emulsion Place 1 drop into both eyes 2 (two) times daily.   diclofenac Sodium (VOLTAREN) 1 % GEL Apply 2 g topically in the morning and at bedtime.   docusate sodium (COLACE) 100 MG capsule Take 100 mg by mouth daily.   hydrALAZINE (APRESOLINE) 10 MG tablet Take 10 mg by mouth 2 (two) times daily as needed.   levothyroxine (SYNTHROID) 25 MCG tablet Take 25 mcg by mouth daily before breakfast.   meclizine (ANTIVERT) 12.5 MG tablet Take 1 tablet (12.5 mg total) by mouth 2 (two) times daily as needed for dizziness.   melatonin 3 MG TABS tablet Take 1 tablet (3 mg total) by mouth at bedtime.   miconazole (MICOTIN) 2 % powder Apply topically daily. Patient may apply to dry skin under breasts daily and after showering   nystatin (MYCOSTATIN/NYSTOP) powder Apply 1 application topically 2 (two) times daily as needed.   pantoprazole (PROTONIX) 40 MG tablet Take 1 tablet (40 mg total) by mouth 2 (two) times daily.   sodium chloride 1 g tablet Take 1 g by mouth 2 (two) times daily.   zinc oxide 20 % ointment Apply 1 application topically as needed (for redness- to be applied to buttocks/peri area and after every incontinent episode).   No facility-administered encounter medications on file as of 09/19/2021.    Review of Systems:  Review of Systems  Constitutional:  Negative for activity change and appetite change.  HENT: Negative.    Respiratory:  Negative for cough and shortness of breath.   Cardiovascular:  Positive for leg swelling.   Gastrointestinal:  Negative for constipation.  Genitourinary:  Positive for dysuria, frequency and urgency.  Musculoskeletal:  Negative for arthralgias, gait problem and myalgias.  Skin: Negative.   Neurological:  Negative for dizziness and weakness.  Psychiatric/Behavioral:  Positive for confusion. Negative for dysphoric mood and sleep disturbance.     Health Maintenance  Topic Date Due   TETANUS/TDAP  10/15/2020   COVID-19 Vaccine (5 - Moderna risk series) 12/26/2020   INFLUENZA VACCINE  09/10/2021   Pneumonia Vaccine 91+ Years old  Completed   DEXA SCAN  Completed   HPV VACCINES  Aged Out   Zoster Vaccines- Shingrix  Discontinued  Physical Exam: Vitals:   09/19/21 1422  BP: 120/76  Pulse: 75  Resp: 14  Temp: 97.9 F (36.6 C)  SpO2: 97%  Weight: 198 lb 6.4 oz (90 kg)   Body mass index is 35.14 kg/m. Physical Exam Vitals reviewed.  Constitutional:      Appearance: Normal appearance.  HENT:     Head: Normocephalic.     Nose: Nose normal.     Mouth/Throat:     Mouth: Mucous membranes are moist.     Pharynx: Oropharynx is clear.  Eyes:     Pupils: Pupils are equal, round, and reactive to light.  Cardiovascular:     Rate and Rhythm: Normal rate and regular rhythm.     Pulses: Normal pulses.     Heart sounds: Normal heart sounds. No murmur heard. Pulmonary:     Effort: Pulmonary effort is normal.     Breath sounds: Normal breath sounds.  Abdominal:     General: Abdomen is flat. Bowel sounds are normal.     Palpations: Abdomen is soft.  Musculoskeletal:        General: Swelling present.     Cervical back: Neck supple.  Skin:    General: Skin is warm.  Neurological:     General: No focal deficit present.     Mental Status: She is alert.     Comments: Was having difficulty coming with orientation questions  Psychiatric:        Mood and Affect: Mood normal.        Thought Content: Thought content normal.     Labs reviewed: Basic Metabolic  Panel: Recent Labs    11/19/20 0000 02/21/21 0000 03/28/21 0000 05/27/21 0000 05/27/21 1244  NA 138  --   --   --  22*  K 4.6  --   --   --  4.3  CL 105  --   --   --  105  CO2 27*  --   --   --  23*  BUN 34*  --   --   --  26*  CREATININE 1.0  --   --   --  1.2*  CALCIUM 9.2  --   --  9.0  --   TSH  --  5.19 3.50  --   --    Liver Function Tests: Recent Labs    11/19/20 0000 05/27/21 0000 05/27/21 1244  AST 16  --  12*  ALT 9  --  18  ALKPHOS 56  --  63  ALBUMIN 3.5 3.4*  --    No results for input(s): "LIPASE", "AMYLASE" in the last 8760 hours. No results for input(s): "AMMONIA" in the last 8760 hours. CBC: Recent Labs    11/19/20 0000 05/27/21 1244  WBC 5.4 5.6  NEUTROABS  --  3,097.00  HGB 11.3* 11.6*  HCT 36 36  PLT 222 236   Lipid Panel: No results for input(s): "CHOL", "HDL", "LDLCALC", "TRIG", "CHOLHDL", "LDLDIRECT" in the last 8760 hours. No results found for: "HGBA1C"  Procedures since last visit: VAS [place] LOWER EXTREMITY VENOUS (DVT)  Result Date: 09/13/2021  Lower Venous DVT Study Patient Name:  [name] [facility]  Date of Exam:   09/12/2021 Medical Rec #: [id number]         Accession #:    [id number] Date of Birth: 04-14-1929         Patient Gender: F Patient Age:   88 years Exam Location:  [facility] Procedure:  VAS [place] LOWER EXTREMITY VENOUS (DVT) Referring Phys: Oswaldo Milian --------------------------------------------------------------------------------  Indications: Swelling/edema of the right lower extremity.  Performing Technologist: Mariane Masters RVT  Examination Guidelines: A complete evaluation includes B-mode imaging, spectral Doppler, color Doppler, and power Doppler as needed of all accessible portions of each vessel. Bilateral testing is considered an integral part of a complete examination. Limited examinations for reoccurring indications may be performed as noted. The reflux portion of the exam is performed with the patient in reverse  Trendelenburg.  +---------+---------------+---------+-----------+----------+--------------+ RIGHT    CompressibilityPhasicitySpontaneityPropertiesThrombus Aging +---------+---------------+---------+-----------+----------+--------------+ CFV      Full           Yes      Yes                                 +---------+---------------+---------+-----------+----------+--------------+ SFJ      Full           Yes      Yes                                 +---------+---------------+---------+-----------+----------+--------------+ FV Prox  Full           Yes      Yes                                 +---------+---------------+---------+-----------+----------+--------------+ FV Mid   Full           Yes      Yes                                 +---------+---------------+---------+-----------+----------+--------------+ FV DistalFull           Yes      Yes                                 +---------+---------------+---------+-----------+----------+--------------+ PFV      Full                                                        +---------+---------------+---------+-----------+----------+--------------+ POP      Full           Yes      Yes                                 +---------+---------------+---------+-----------+----------+--------------+ PTV      Full           Yes      Yes                                 +---------+---------------+---------+-----------+----------+--------------+ PERO     Full           Yes      Yes                                 +---------+---------------+---------+-----------+----------+--------------+  Gastroc  Full                                                        +---------+---------------+---------+-----------+----------+--------------+ GSV      Full           Yes      Yes                                 +---------+---------------+---------+-----------+----------+--------------+    +----+---------------+---------+-----------+----------+--------------+ LEFTCompressibilityPhasicitySpontaneityPropertiesThrombus Aging +----+---------------+---------+-----------+----------+--------------+ CFV Full           Yes      Yes                                 +----+---------------+---------+-----------+----------+--------------+     Summary: RIGHT: - No evidence of deep vein thrombosis in the lower extremity. No indirect evidence of obstruction proximal to the inguinal ligament. - No cystic structure found in the popliteal fossa.  LEFT: - No evidence of common femoral vein obstruction.  *See table(s) above for measurements and observations. Electronically signed by Larae Grooms MD on 09/13/2021 at 9:40:35 PM.    Final     Assessment/Plan 1. Acute cystitis without hematuria Urine culture positive for E Coli She is Allergic to Pencillin Will Do Cipro  500 mg BID for 7 days  2. Cognitive impairment Recent Acute drop in MMSE per Speech 15/30 Discussed with Daughter If no improvement after treating UTI in 2 weeks will consider CT Scan /MRI Of head Possible Neuro consult  3. Atrial flutter, unspecified type [facility]) Not on any coagulation per her choice  Stays at risk of TIA 4 Urinary Incontinence Having Accidents  ? Due to infection Will also Tyr Myrbetriq 25 mg QD 5 Ankle edema, bilateral Dopplers negative Now has ted hoses Did get few doses of Lasix  6 Essential hypertension Off all meds  7 Hyponatremia Does well with Sodium tabs  8. Acquired hypothyroidism Tsh normal 02/23 9 Gastroesophageal reflux disease, unspecified whether esophagitis present On Protonix 10 CKD Stage 3a Creat stable   Labs/tests ordered:  * No order type specified * Next appt:  Visit date not found

## 2021-09-26 ENCOUNTER — Other Ambulatory Visit: Payer: Self-pay | Admitting: Internal Medicine

## 2021-09-26 DIAGNOSIS — I4892 Unspecified atrial flutter: Secondary | ICD-10-CM

## 2021-09-26 DIAGNOSIS — R4189 Other symptoms and signs involving cognitive functions and awareness: Secondary | ICD-10-CM

## 2021-09-26 NOTE — Progress Notes (Signed)
Per Nurses patient continues to need help with her ADLS and Lot of Cueing No improvement after treating UTI I am ordering MRI to rule out Stroke She has h/o Atrial Flutter not on Anticoagulation Also made Neuro consult

## 2021-09-27 ENCOUNTER — Non-Acute Institutional Stay: Payer: Medicare Other | Admitting: Orthopedic Surgery

## 2021-09-27 ENCOUNTER — Encounter: Payer: Self-pay | Admitting: Orthopedic Surgery

## 2021-09-27 DIAGNOSIS — I4892 Unspecified atrial flutter: Secondary | ICD-10-CM

## 2021-09-27 DIAGNOSIS — I10 Essential (primary) hypertension: Secondary | ICD-10-CM

## 2021-09-27 DIAGNOSIS — N3281 Overactive bladder: Secondary | ICD-10-CM | POA: Diagnosis not present

## 2021-09-27 DIAGNOSIS — R4189 Other symptoms and signs involving cognitive functions and awareness: Secondary | ICD-10-CM

## 2021-09-27 DIAGNOSIS — E039 Hypothyroidism, unspecified: Secondary | ICD-10-CM

## 2021-09-27 DIAGNOSIS — N3000 Acute cystitis without hematuria: Secondary | ICD-10-CM

## 2021-09-27 DIAGNOSIS — E871 Hypo-osmolality and hyponatremia: Secondary | ICD-10-CM

## 2021-09-27 NOTE — Progress Notes (Signed)
Location:   [place] Room Number: [place] of Service:  [phone]) Provider:  Windell Moulding, NP  Virgie Dad, MD  Patient Care Team: Virgie Dad, MD as PCP - General (Internal Medicine) Donato Heinz, MD as PCP - Cardiology (Cardiology) Brien Few, MD as Consulting Physician (Obstetrics and Gynecology) Marti Sleigh, MD as Consulting Physician (Gynecology) Nancy Marus, MD as Consulting Physician (Gynecologic Oncology)  Extended Emergency Contact Information Primary Emergency Contact: [facility] Address: [address]          [place],  [address] [name] of [place] Phone: [phone] Mobile Phone: [phone] Relation: Daughter Secondary Emergency Contact: [place] Mobile Phone: [phone] Relation: Son  Code Status:   Goals of care: Advanced Directive information    09/27/2021   11:06 AM  Advanced Directives  Does Patient Have a Medical Advance Directive? Yes  Type of Paramedic of [place];Living will  Does patient want to make changes to medical advance directive? No - Patient declined  Copy of [place] in Chart? Yes - validated most recent copy scanned in chart (See row information)     Chief Complaint  Patient presents with   Acute Visit    Memory Impairment    HPI:  Pt is a 88 y.o. female seen today for an acute visit for memory impairment.   She currently resides on the assisted living unit at [facility]. PMH: atherosclerosis of abdominal aorta, HTN, atrial flutter, orthostatic hypotension,syncope, GERD, constipation, ovarian cancer, hyponatremia, cognitive impairment, OA, CKD III, anemia, obesity and insomnia.   Acute cognitive decline- recent MMSE 15/30, needing more cues to perform ADLs, 2022 CT head noted white mater tracts of supratentorial brain/ microvascular disease changes, neurology consult/ MRI brain ordered by Dr.  Lyndel Safe Acute cystitis/frequency- 09/16/2021 UA/culture > 100,000 cfu/ml E.coli, completed Cipro x 7 days, Myrbetriq started 08/18 HTN- BUN/creat 27/1.14 09/12/2021, not on scheduled medications at this time due to orthostatic hypotension, hydralazine prn for SBP> 170 Hyponatremia- Na 139 09/12/2021 , remains on sodium tablets Atrial flutter-  followed by cardiology, revealed on 30-day monitor 06/04/2020, not on anticoagulation due to falls Hypothyroidism- TSH 3.50 03/28/2021, remains on levothyroxine   Past Medical History:  Diagnosis Date   Anemia associated with acute blood loss 04/03/2011   Arthritis    knees    Ascites    Blood transfusion    hx of 6063    Complication of anesthesia    Sodium drops per pt    Cystitis    DIARRHEA, ANTIBIOTIC ASSOCIATED 05/31/2009   Qualifier: Diagnosis of  By: Tommy Medal MD, Cornelius     GERD (gastroesophageal reflux disease)    H/O hiatal hernia    Hyperlipidemia    Hypertension    Hyponatremia    Neutropenia with fever ([place]) 05/18/2011   OSTEOMYELITIS, CHRONIC, LOWER LEG 04/23/2009   Qualifier: Diagnosis of  By: Johnnye Sima MD, Felecia Shelling    OSTEOPOROSIS 04/23/2009   Qualifier: Diagnosis of  By: Johnnye Sima MD, Jeffrey     Ovarian cancer ([place]) 01/25/2011   Pneumonia    hx of    Postmenopausal atrophic vaginitis    PROSTHETIC JOINT COMPLICATION 0/16/0109   Qualifier: Diagnosis of  By: Tommy Medal MD, Cornelius     Renal disorder    Decreased kidney function   Staph infection 2010   after knee replacement   Urinary frequency    Vulvitis    Past Surgical History:  Procedure Laterality Date   ABDOMINAL HYSTERECTOMY  04/01/2011   Procedure: HYSTERECTOMY ABDOMINAL;  Surgeon: Alvino Chapel, MD;  Location: [facility];  Service: Gynecology;  Laterality: N/A;   APPENDECTOMY     JOINT REPLACEMENT     R knee in 2008, 5 operations on L knee   LAPAROTOMY  04/01/2011   Procedure: EXPLORATORY LAPAROTOMY;  Surgeon: Alvino Chapel, MD;   Location: [facility];  Service: Gynecology;  Laterality: N/A;   OTHER SURGICAL HISTORY     hx of C section 1966   SALPINGOOPHORECTOMY  04/01/2011   Procedure: SALPINGO OOPHERECTOMY;  Surgeon: Alvino Chapel, MD;  Location: [facility];  Service: Gynecology;  Laterality: Bilateral;   TONSILLECTOMY      Allergies  Allergen Reactions   Morphine And Related Anaphylaxis and Other (See Comments)    Given after knee replacement and code blue occurred   Morphine Sulfate Anaphylaxis   Penicillins Hives, Itching and Other (See Comments)    Syncope, also   Nsaids Other (See Comments)    Because of an abnormal kidney test result   Penicillin V Hives   Shellfish Allergy Nausea And Vomiting and Other (See Comments)    Pt has shellfish allergy only.  Has had IV contrast x 2 and did fine.   Shellfish-Derived Products Nausea And Vomiting    Allergies as of 09/27/2021       Reactions   Morphine And Related Anaphylaxis, Other (See Comments)   Given after knee replacement and code blue occurred   Morphine Sulfate Anaphylaxis   Penicillins Hives, Itching, Other (See Comments)   Syncope, also   Nsaids Other (See Comments)   Because of an abnormal kidney test result   Penicillin V Hives   Shellfish Allergy Nausea And Vomiting, Other (See Comments)   Pt has shellfish allergy only.  Has had IV contrast x 2 and did fine.   Shellfish-derived Products Nausea And Vomiting        Medication List        Accurate as of September 27, 2021 11:09 AM. If you have any questions, ask your nurse or doctor.          acetaminophen 500 MG tablet Commonly known as: TYLENOL Take 1,000 mg by mouth every 8 (eight) hours as needed.   azelastine 0.1 % nasal spray Commonly known as: ASTELIN Place 1 spray into both nostrils daily as needed for rhinitis or allergies.   B-COMPLEX/FOLIC ACID/VITAMIN C PO Take 1 tablet by mouth daily with breakfast.   bimatoprost 0.03 % ophthalmic solution Commonly known as:  LUMIGAN Place 1 drop into both eyes at bedtime.   calcium carbonate 750 MG chewable tablet Commonly known as: TUMS EX Chew 750 mg by mouth daily as needed for heartburn.   cycloSPORINE 0.05 % ophthalmic emulsion Commonly known as: RESTASIS Place 1 drop into both eyes 2 (two) times daily.   diclofenac Sodium 1 % Gel Commonly known as: VOLTAREN Apply 2 g topically in the morning and at bedtime.   docusate sodium 100 MG capsule Commonly known as: COLACE Take 100 mg by mouth daily.   hydrALAZINE 10 MG tablet Commonly known as: APRESOLINE Take 10 mg by mouth 2 (two) times daily as needed.   levothyroxine 25 MCG tablet Commonly known as: SYNTHROID Take 25 mcg by mouth daily before breakfast.   meclizine 12.5 MG tablet Commonly known as: ANTIVERT Take 1 tablet (12.5 mg total) by mouth 2 (two) times daily as needed for dizziness.  melatonin 3 MG Tabs tablet Take 1 tablet (3 mg total) by mouth at bedtime.   miconazole 2 % powder Commonly known as: MICOTIN Apply topically daily. Patient may apply to dry skin under breasts daily and after showering   Myrbetriq 25 MG Tb24 tablet Generic drug: mirabegron ER Take 25 mg by mouth daily.   nystatin powder Commonly known as: MYCOSTATIN/NYSTOP Apply 1 application topically 2 (two) times daily as needed.   pantoprazole 40 MG tablet Commonly known as: PROTONIX Take 1 tablet (40 mg total) by mouth 2 (two) times daily.   sodium chloride 1 g tablet Take 1 g by mouth 2 (two) times daily.   Vitamin D3 50 MCG (2000 UT) Tabs Take 2,000 Units by mouth in the morning.   zinc oxide 20 % ointment Apply 1 application topically as needed (for redness- to be applied to buttocks/peri area and after every incontinent episode).        Review of Systems  Constitutional:  Negative for activity change, appetite change, chills, fatigue and fever.  HENT:  Positive for hearing loss. Negative for congestion and trouble swallowing.   Eyes:   Negative for visual disturbance.  Respiratory:  Negative for cough, shortness of breath and wheezing.   Cardiovascular:  Positive for leg swelling. Negative for chest pain.  Gastrointestinal:  Negative for abdominal distention, abdominal pain, constipation, diarrhea, nausea and vomiting.  Genitourinary:  Positive for frequency. Negative for dysuria and hematuria.       Incontinence  Musculoskeletal:  Positive for arthralgias and gait problem.  Skin:  Negative for wound.  Neurological:  Positive for weakness. Negative for dizziness and headaches.  Psychiatric/Behavioral:  Positive for confusion and dysphoric mood. Negative for sleep disturbance. The patient is not nervous/anxious.     Immunization History  Administered Date(s) Administered   Influenza Split 11/13/2012, 12/13/2012, 11/30/2013, 10/09/2014, 11/24/2018   Influenza, High Dose Seasonal PF 12/01/2013, 10/04/2014, 10/12/2015, 11/06/2016, 11/12/2017   Influenza,inj,Quad PF,6+ Mos 10/24/2010   Influenza-Unspecified 10/24/2010, 11/01/2019, 12/04/2020   Moderna SARS-COV2 Booster Vaccination 07/18/2020   Moderna Sars-Covid-2 Vaccination 02/14/2019, 03/14/2019, 12/26/2019   PFIZER(Purple Top)SARS-COV-2 Vaccination 10/31/2020   Pfizer Covid-19 Vaccine Bivalent Booster 54yr & up 10/31/2020   Pneumococcal Conjugate-13 09/28/2013   Pneumococcal Polysaccharide-23 03/05/2012   Td 08/27/2000   Tdap 08/27/2000, 10/16/2010   Zoster, Live 01/11/2013, 03/31/2020   Pertinent  Health Maintenance Due  Topic Date Due   INFLUENZA VACCINE  09/10/2021   DEXA SCAN  Completed      04/29/2020    7:00 AM 04/29/2020    8:00 PM 04/30/2020    1:00 PM 08/16/2020    3:47 PM 08/23/2021    1:08 PM  Fall Risk  Falls in the past year?    1 1  Was there an injury with Fall?    0 0  Fall Risk Category Calculator    2 1  Fall Risk Category    Moderate Low  Patient Fall Risk Level High fall risk High fall risk High fall risk  Moderate fall risk  Patient at  Risk for Falls Due to    History of fall(s);Impaired balance/gait;Impaired mobility;No Fall Risks History of fall(s);Impaired balance/gait;Impaired mobility  Fall risk Follow up    Falls evaluation completed;Education provided;Falls prevention discussed Falls evaluation completed;Education provided;Falls prevention discussed   Functional Status Survey:    Vitals:   09/27/21 1039  BP: (!) 150/80  Pulse: 70  Resp: 20  Temp: (!) 97.3 F (36.3 C)  SpO2: 98%  Weight:  198 lb 6.4 oz (90 kg)   Body mass index is 35.14 kg/m. Physical Exam Vitals reviewed.  Constitutional:      General: She is not in acute distress. HENT:     Head: Normocephalic.     Ears:     Comments: Bilateral hearing aids Eyes:     General:        Right eye: No discharge.        Left eye: No discharge.  Cardiovascular:     Rate and Rhythm: Normal rate and regular rhythm.     Pulses: Normal pulses.     Heart sounds: Normal heart sounds.  Pulmonary:     Effort: Pulmonary effort is normal. No respiratory distress.     Breath sounds: Normal breath sounds. No wheezing.  Abdominal:     General: Bowel sounds are normal. There is no distension.     Palpations: Abdomen is soft.     Tenderness: There is no abdominal tenderness.  Musculoskeletal:     Cervical back: Neck supple.     Right lower leg: Edema present.     Left lower leg: Edema present.     Comments: Non pitting  Skin:    General: Skin is warm and dry.     Capillary Refill: Capillary refill takes less than 2 seconds.  Neurological:     General: No focal deficit present.     Mental Status: She is alert. Mental status is at baseline.     Motor: Weakness present.     Gait: Gait abnormal.     Comments: PWC  Psychiatric:        Mood and Affect: Mood normal.        Behavior: Behavior normal.     Comments: Very pleasant, follows commands, alert to self/person/place     Labs reviewed: Recent Labs    11/19/20 0000 05/27/21 0000 05/27/21 1244  NA  138  --  22*  K 4.6  --  4.3  CL 105  --  105  CO2 27*  --  23*  BUN 34*  --  26*  CREATININE 1.0  --  1.2*  CALCIUM 9.2 9.0  --    Recent Labs    11/19/20 0000 05/27/21 0000 05/27/21 1244  AST 16  --  12*  ALT 9  --  18  ALKPHOS 56  --  63  ALBUMIN 3.5 3.4*  --    Recent Labs    11/19/20 0000 05/27/21 1244  WBC 5.4 5.6  NEUTROABS  --  3,097.00  HGB 11.3* 11.6*  HCT 36 36  PLT 222 236   Lab Results  Component Value Date   TSH 3.50 03/28/2021   No results found for: "HGBA1C" No results found for: "CHOL", "HDL", "LDLCALC", "LDLDIRECT", "TRIG", "CHOLHDL"  Significant Diagnostic Results in last 30 days:  VAS [place] LOWER EXTREMITY VENOUS (DVT)  Result Date: 09/13/2021  Lower Venous DVT Study Patient Name:  KATHLYNE LOUD [facility]  Date of Exam:   09/12/2021 Medical Rec #: [id number]         Accession #:    [id number] Date of Birth: [birth year]         Patient Gender: F Patient Age:   89 years Exam Location:  [facility] Procedure:      VAS [place] LOWER EXTREMITY VENOUS (DVT) Referring Phys: Oswaldo Milian --------------------------------------------------------------------------------  Indications: Swelling/edema of the right lower extremity.  Performing Technologist: Mariane Masters RVT  Examination Guidelines: A complete evaluation includes B-mode imaging, spectral  Doppler, color Doppler, and power Doppler as needed of all accessible portions of each vessel. Bilateral testing is considered an integral part of a complete examination. Limited examinations for reoccurring indications may be performed as noted. The reflux portion of the exam is performed with the patient in reverse Trendelenburg.  +---------+---------------+---------+-----------+----------+--------------+ RIGHT    CompressibilityPhasicitySpontaneityPropertiesThrombus Aging +---------+---------------+---------+-----------+----------+--------------+ CFV      Full           Yes      Yes                                  +---------+---------------+---------+-----------+----------+--------------+ SFJ      Full           Yes      Yes                                 +---------+---------------+---------+-----------+----------+--------------+ FV Prox  Full           Yes      Yes                                 +---------+---------------+---------+-----------+----------+--------------+ FV Mid   Full           Yes      Yes                                 +---------+---------------+---------+-----------+----------+--------------+ FV DistalFull           Yes      Yes                                 +---------+---------------+---------+-----------+----------+--------------+ PFV      Full                                                        +---------+---------------+---------+-----------+----------+--------------+ POP      Full           Yes      Yes                                 +---------+---------------+---------+-----------+----------+--------------+ PTV      Full           Yes      Yes                                 +---------+---------------+---------+-----------+----------+--------------+ PERO     Full           Yes      Yes                                 +---------+---------------+---------+-----------+----------+--------------+ Gastroc  Full                                                        +---------+---------------+---------+-----------+----------+--------------+  GSV      Full           Yes      Yes                                 +---------+---------------+---------+-----------+----------+--------------+   +----+---------------+---------+-----------+----------+--------------+ LEFTCompressibilityPhasicitySpontaneityPropertiesThrombus Aging +----+---------------+---------+-----------+----------+--------------+ CFV Full           Yes      Yes                                  +----+---------------+---------+-----------+----------+--------------+     Summary: RIGHT: - No evidence of deep vein thrombosis in the lower extremity. No indirect evidence of obstruction proximal to the inguinal ligament. - No cystic structure found in the popliteal fossa.  LEFT: - No evidence of common femoral vein obstruction.  *See table(s) above for measurements and observations. Electronically signed by Larae Grooms MD on 09/13/2021 at 9:40:35 PM.    Final     Assessment/Plan 1. Acute cognitive decline - recent MMSE 15/30 - needs more cues for ADLs - no behaviors - neurology consult  - MRI brain w/o contrast  2. Acute cystitis without hematuria - urine culture > 100,000 cfu/ML E.coli - resolved with Cipro  3. OAB (overactive bladder) - Myrbetriq started 08/18  4. Essential hypertension - controlled - not on scheduled antihypertensive due to hypotension - cont hydralazine prn   5. Hyponatremia - Na+ 139 09/12/2021  6. Atrial flutter, unspecified type ([place]) - followed by cardiology - off medications due to falls  7. Acquired hypothyroidism - TSH stable - cont levothyroxine    Family/ staff Communication: plan discussed with patient and nurse  Labs/tests ordered:  neurology consult, MRI brain w/o contrast

## 2021-10-01 ENCOUNTER — Encounter: Payer: Self-pay | Admitting: Adult Health

## 2021-10-01 ENCOUNTER — Non-Acute Institutional Stay: Payer: Medicare Other | Admitting: Adult Health

## 2021-10-01 DIAGNOSIS — G8929 Other chronic pain: Secondary | ICD-10-CM

## 2021-10-01 DIAGNOSIS — M25571 Pain in right ankle and joints of right foot: Secondary | ICD-10-CM | POA: Diagnosis not present

## 2021-10-01 DIAGNOSIS — B372 Candidiasis of skin and nail: Secondary | ICD-10-CM | POA: Diagnosis not present

## 2021-10-01 NOTE — Progress Notes (Signed)
Location:  [facility] Room Number: 10 A Place of Service:  ALF [phone]) Provider:  Durenda Age, DNP, FNP-BC  Patient Care Team: Virgie Dad, MD as PCP - General (Internal Medicine) Donato Heinz, MD as PCP - Cardiology (Cardiology) Brien Few, MD as Consulting Physician (Obstetrics and Gynecology) Marti Sleigh, MD as Consulting Physician (Gynecology) Nancy Marus, MD as Consulting Physician (Gynecologic Oncology)  Extended Emergency Contact Information Primary Emergency Contact: [facility] Address: [address]          [place], [place] [address] [name] of [place] Phone: [phone] Mobile Phone: [phone] Relation: Daughter Secondary Emergency Contact: [place] Mobile Phone: [phone] Relation: Son  Code Status:  Full Code  Goals of care: Advanced Directive information    09/27/2021   11:06 AM  Advanced Directives  Does Patient Have a Medical Advance Directive? Yes  Type of Paramedic of [place];Living will  Does patient want to make changes to medical advance directive? No - Patient declined  Copy of [place] in Chart? Yes - validated most recent copy scanned in chart (See row information)     Chief Complaint  Patient presents with   Acute Visit    Abdominal rashes    HPI:  Pt is a 88 y.o. female seen today for an acute visit regarding rashes on abdomen. She is a resident of [place]. She was noted to have erythematous rashes on bilateral abdominal folds, L>R. No noted open wound. Area is moist. She complained of her right ankle pain. Noted right ankle with 1-2+edema. Staff nurse reported that she has been refusing ted stockings.   Past Medical History:  Diagnosis Date   Anemia associated with acute blood loss 04/03/2011   Arthritis    knees    Ascites    Blood transfusion    hx of 2993    Complication of anesthesia     Sodium drops per pt    Cystitis    DIARRHEA, ANTIBIOTIC ASSOCIATED 05/31/2009   Qualifier: Diagnosis of  By: Tommy Medal MD, Cornelius     GERD (gastroesophageal reflux disease)    H/O hiatal hernia    Hyperlipidemia    Hypertension    Hyponatremia    Neutropenia with fever ([place]) 05/18/2011   OSTEOMYELITIS, CHRONIC, LOWER LEG 04/23/2009   Qualifier: Diagnosis of  By: Johnnye Sima MD, Felecia Shelling    OSTEOPOROSIS 04/23/2009   Qualifier: Diagnosis of  By: Johnnye Sima MD, Jeffrey     Ovarian cancer ([place]) 01/25/2011   Pneumonia    hx of    Postmenopausal atrophic vaginitis    PROSTHETIC JOINT COMPLICATION 08/25/9676   Qualifier: Diagnosis of  By: Tommy Medal MD, Cornelius     Renal disorder    Decreased kidney function   Staph infection 2010   after knee replacement   Urinary frequency    Vulvitis    Past Surgical History:  Procedure Laterality Date   ABDOMINAL HYSTERECTOMY  04/01/2011   Procedure: HYSTERECTOMY ABDOMINAL;  Surgeon: Alvino Chapel, MD;  Location: [facility];  Service: Gynecology;  Laterality: N/A;   APPENDECTOMY     JOINT REPLACEMENT     R knee in 2008, 5 operations on L knee   LAPAROTOMY  04/01/2011   Procedure: EXPLORATORY LAPAROTOMY;  Surgeon: Alvino Chapel, MD;  Location: [facility];  Service: Gynecology;  Laterality: N/A;   OTHER SURGICAL HISTORY     hx of C section  1966   SALPINGOOPHORECTOMY  04/01/2011   Procedure: SALPINGO OOPHERECTOMY;  Surgeon: Alvino Chapel, MD;  Location: [facility];  Service: Gynecology;  Laterality: Bilateral;   TONSILLECTOMY      Allergies  Allergen Reactions   Morphine And Related Anaphylaxis and Other (See Comments)    Given after knee replacement and code blue occurred   Morphine Sulfate Anaphylaxis   Penicillins Hives, Itching and Other (See Comments)    Syncope, also   Nsaids Other (See Comments)    Because of an abnormal kidney test result   Penicillin V Hives   Shellfish Allergy Nausea And Vomiting and Other  (See Comments)    Pt has shellfish allergy only.  Has had IV contrast x 2 and did fine.   Shellfish-Derived Products Nausea And Vomiting    Outpatient Encounter Medications as of 10/01/2021  Medication Sig   acetaminophen (TYLENOL) 500 MG tablet Take 1,000 mg by mouth every 8 (eight) hours as needed.   azelastine (ASTELIN) 0.1 % nasal spray Place 1 spray into both nostrils daily as needed for rhinitis or allergies.   B Complex-C-Folic Acid (B-COMPLEX/FOLIC ACID/VITAMIN C PO) Take 1 tablet by mouth daily with breakfast.   bimatoprost (LUMIGAN) 0.03 % ophthalmic solution Place 1 drop into both eyes at bedtime.   calcium carbonate (TUMS EX) 750 MG chewable tablet Chew 750 mg by mouth daily as needed for heartburn.   Cholecalciferol (VITAMIN D3) 50 MCG (2000 UT) TABS Take 2,000 Units by mouth in the morning.   cycloSPORINE (RESTASIS) 0.05 % ophthalmic emulsion Place 1 drop into both eyes 2 (two) times daily.   diclofenac Sodium (VOLTAREN) 1 % GEL Apply 2 g topically in the morning and at bedtime.   docusate sodium (COLACE) 100 MG capsule Take 100 mg by mouth daily.   hydrALAZINE (APRESOLINE) 10 MG tablet Take 10 mg by mouth 2 (two) times daily as needed.   levothyroxine (SYNTHROID) 25 MCG tablet Take 25 mcg by mouth daily before breakfast.   meclizine (ANTIVERT) 12.5 MG tablet Take 1 tablet (12.5 mg total) by mouth 2 (two) times daily as needed for dizziness.   melatonin 3 MG TABS tablet Take 1 tablet (3 mg total) by mouth at bedtime.   miconazole (MICOTIN) 2 % powder Apply topically daily. Patient may apply to dry skin under breasts daily and after showering   mirabegron ER (MYRBETRIQ) 25 MG TB24 tablet Take 25 mg by mouth daily.   nystatin (MYCOSTATIN/NYSTOP) powder Apply 1 application topically 2 (two) times daily as needed.   pantoprazole (PROTONIX) 40 MG tablet Take 1 tablet (40 mg total) by mouth 2 (two) times daily.   sodium chloride 1 g tablet Take 1 g by mouth 2 (two) times daily.   zinc  oxide 20 % ointment Apply 1 application topically as needed (for redness- to be applied to buttocks/peri area and after every incontinent episode).   No facility-administered encounter medications on file as of 10/01/2021.    Review of Systems  Constitutional:  Negative for appetite change, chills, fatigue and fever.  HENT:  Negative for congestion, hearing loss, rhinorrhea and sore throat.   Eyes: Negative.   Respiratory:  Negative for cough, shortness of breath and wheezing.   Cardiovascular:  Negative for chest pain, palpitations and leg swelling.  Gastrointestinal:  Negative for abdominal pain, constipation, diarrhea, nausea and vomiting.  Genitourinary:  Negative for dysuria.  Musculoskeletal:  Negative for arthralgias, back pain and myalgias.  Skin:  Positive for rash. Negative for  color change and wound.       Abdominal rashes  Neurological:  Negative for dizziness, weakness and headaches.  Psychiatric/Behavioral:  Negative for behavioral problems. The patient is not nervous/anxious.        Immunization History  Administered Date(s) Administered   Influenza Split 11/13/2012, 12/13/2012, 11/30/2013, 10/09/2014, 11/24/2018   Influenza, High Dose Seasonal PF 12/01/2013, 10/04/2014, 10/12/2015, 11/06/2016, 11/12/2017   Influenza,inj,Quad PF,6+ Mos 10/24/2010   Influenza-Unspecified 10/24/2010, 11/01/2019, 12/04/2020   Moderna SARS-COV2 Booster Vaccination 07/18/2020   Moderna Sars-Covid-2 Vaccination 02/14/2019, 03/14/2019, 12/26/2019   PFIZER(Purple Top)SARS-COV-2 Vaccination 10/31/2020   Pfizer Covid-19 Vaccine Bivalent Booster 50yr & up 10/31/2020   Pneumococcal Conjugate-13 09/28/2013   Pneumococcal Polysaccharide-23 03/05/2012   Td 08/27/2000   Tdap 08/27/2000, 10/16/2010   Zoster, Live 01/11/2013, 03/31/2020   Pertinent  Health Maintenance Due  Topic Date Due   INFLUENZA VACCINE  09/10/2021   DEXA SCAN  Completed      04/29/2020    7:00 AM 04/29/2020    8:00 PM  04/30/2020    1:00 PM 08/16/2020    3:47 PM 08/23/2021    1:08 PM  Fall Risk  Falls in the past year?    1 1  Was there an injury with Fall?    0 0  Fall Risk Category Calculator    2 1  Fall Risk Category    Moderate Low  Patient Fall Risk Level High fall risk High fall risk High fall risk  Moderate fall risk  Patient at Risk for Falls Due to    History of fall(s);Impaired balance/gait;Impaired mobility;No Fall Risks History of fall(s);Impaired balance/gait;Impaired mobility  Fall risk Follow up    Falls evaluation completed;Education provided;Falls prevention discussed Falls evaluation completed;Education provided;Falls prevention discussed     Vitals:   10/01/21 1716  BP: 120/76  Pulse: 75  Resp: 14  Temp: 97.9 F (36.6 C)  SpO2: 97%  Weight: 198 lb 6.4 oz (90 kg)  Height: '5\' 3"'$  (1.6 m)   Body mass index is 35.14 kg/m.  Physical Exam Constitutional:      General: She is not in acute distress.    Appearance: She is obese.  HENT:     Head: Normocephalic and atraumatic.     Nose: Nose normal.     Mouth/Throat:     Mouth: Mucous membranes are moist.  Eyes:     Conjunctiva/sclera: Conjunctivae normal.  Cardiovascular:     Rate and Rhythm: Normal rate and regular rhythm.  Pulmonary:     Effort: Pulmonary effort is normal.     Breath sounds: Normal breath sounds.  Abdominal:     General: Bowel sounds are normal.     Palpations: Abdomen is soft.  Musculoskeletal:        General: Swelling present. Normal range of motion.     Cervical back: Normal range of motion.     Right lower leg: Edema present.     Comments: Right ankle 1-2+edema  Skin:    General: Skin is warm and dry.     Findings: Rash present.     Comments: Abdominal folds erythematous rashes, L>R.  Neurological:     General: No focal deficit present.     Mental Status: She is alert and oriented to person, place, and time.  Psychiatric:        Mood and Affect: Mood normal.        Behavior: Behavior normal.         Thought Content: Thought content  normal.        Judgment: Judgment normal.        Labs reviewed: Recent Labs    11/19/20 0000 05/27/21 0000 05/27/21 1244 09/12/21 0000  NA 138  --  22* 139  K 4.6  --  4.3 4.4  CL 105  --  105 103  CO2 27*  --  23* 26*  BUN 34*  --  26* 27*  CREATININE 1.0  --  1.2*  --   CALCIUM 9.2 9.0  --  9.5   Recent Labs    11/19/20 0000 05/27/21 0000 05/27/21 1244  AST 16  --  12*  ALT 9  --  18  ALKPHOS 56  --  63  ALBUMIN 3.5 3.4*  --    Recent Labs    11/19/20 0000 05/27/21 1244 09/12/21 0000  WBC 5.4 5.6 6.5  NEUTROABS  --  3,097.00 4,381.00  HGB 11.3* 11.6* 13.9  HCT 36 36 43  PLT 222 236 212   Lab Results  Component Value Date   TSH 3.50 03/28/2021   No results found for: "HGBA1C" No results found for: "CHOL", "HDL", "LDLCALC", "LDLDIRECT", "TRIG", "CHOLHDL"  Significant Diagnostic Results in last 30 days:  VAS [place] LOWER EXTREMITY VENOUS (DVT)  Result Date: 09/13/2021  Lower Venous DVT Study Patient Name:  [name]  Date of Exam:   09/12/2021 Medical Rec #: [id number]         Accession #:    [id number] Date of Birth: 01-01-1930         Patient Gender: F Patient Age:   78 years Exam Location:  [facility] Procedure:      VAS [place] LOWER EXTREMITY VENOUS (DVT) Referring Phys: Oswaldo Milian --------------------------------------------------------------------------------  Indications: Swelling/edema of the right lower extremity.  Performing Technologist: Mariane Masters RVT  Examination Guidelines: A complete evaluation includes B-mode imaging, spectral Doppler, color Doppler, and power Doppler as needed of all accessible portions of each vessel. Bilateral testing is considered an integral part of a complete examination. Limited examinations for reoccurring indications may be performed as noted. The reflux portion of the exam is performed with the patient in reverse Trendelenburg.   +---------+---------------+---------+-----------+----------+--------------+ RIGHT    CompressibilityPhasicitySpontaneityPropertiesThrombus Aging +---------+---------------+---------+-----------+----------+--------------+ CFV      Full           Yes      Yes                                 +---------+---------------+---------+-----------+----------+--------------+ SFJ      Full           Yes      Yes                                 +---------+---------------+---------+-----------+----------+--------------+ FV Prox  Full           Yes      Yes                                 +---------+---------------+---------+-----------+----------+--------------+ FV Mid   Full           Yes      Yes                                 +---------+---------------+---------+-----------+----------+--------------+  FV DistalFull           Yes      Yes                                 +---------+---------------+---------+-----------+----------+--------------+ PFV      Full                                                        +---------+---------------+---------+-----------+----------+--------------+ POP      Full           Yes      Yes                                 +---------+---------------+---------+-----------+----------+--------------+ PTV      Full           Yes      Yes                                 +---------+---------------+---------+-----------+----------+--------------+ PERO     Full           Yes      Yes                                 +---------+---------------+---------+-----------+----------+--------------+ Gastroc  Full                                                        +---------+---------------+---------+-----------+----------+--------------+ GSV      Full           Yes      Yes                                 +---------+---------------+---------+-----------+----------+--------------+    +----+---------------+---------+-----------+----------+--------------+ LEFTCompressibilityPhasicitySpontaneityPropertiesThrombus Aging +----+---------------+---------+-----------+----------+--------------+ CFV Full           Yes      Yes                                 +----+---------------+---------+-----------+----------+--------------+     Summary: RIGHT: - No evidence of deep vein thrombosis in the lower extremity. No indirect evidence of obstruction proximal to the inguinal ligament. - No cystic structure found in the popliteal fossa.  LEFT: - No evidence of common femoral vein obstruction.  *See table(s) above for measurements and observations. Electronically signed by Larae Grooms MD on 09/13/2021 at 9:40:35 PM.    Final     Assessment/Plan  1. Candidal skin infection -  start Miconazole Nitrate 2% powder apply to rashes on bilateral abdominal folds QID -  Keep skin clean and dry   Wear loose-fitting clothing  2. Chronic pain of right ankle -   continue Diclofenac 1% gel 2 gm topically AM and HS -   encouraged to wear ted stockings in AM and off at HS  Family/ staff Communication: Discussed plan of care with resident and charge nurse  Labs/tests ordered:   None    Durenda Age, DNP, MSN, FNP-BC [facility] [phone] (Monday-Friday 8:00 a.m. - 5:00 p.m.) [phone] (after hours)

## 2021-10-02 ENCOUNTER — Telehealth: Payer: Self-pay | Admitting: Orthopedic Surgery

## 2021-10-02 NOTE — Telephone Encounter (Signed)
Repeat MMSE 18/30 today per ST.

## 2021-10-03 ENCOUNTER — Encounter: Payer: Self-pay | Admitting: Physician Assistant

## 2021-10-08 ENCOUNTER — Ambulatory Visit: Payer: Medicare Other | Admitting: Physician Assistant

## 2021-10-16 ENCOUNTER — Other Ambulatory Visit: Payer: Medicare Other

## 2021-10-17 ENCOUNTER — Ambulatory Visit (INDEPENDENT_AMBULATORY_CARE_PROVIDER_SITE_OTHER): Payer: Medicare Other | Admitting: Physician Assistant

## 2021-10-17 DIAGNOSIS — F0280 Dementia in other diseases classified elsewhere without behavioral disturbance: Secondary | ICD-10-CM | POA: Diagnosis not present

## 2021-10-17 DIAGNOSIS — G309 Alzheimer's disease, unspecified: Secondary | ICD-10-CM

## 2021-10-17 NOTE — Patient Instructions (Addendum)
It was a pleasure to see you today at our office.   Recommendations:  Follow up in 2 months Await the results of the MRI brain for further details    Whom to call:  Memory  decline, memory medications: Call our office [phone]   For psychiatric meds, mood meds: Please have your primary care physician manage these medications.   Counseling regarding caregiver distress, including caregiver depression, anxiety and issues regarding community resources, adult day care programs, adult living facilities, or memory care questions:   Feel free to contact [place], Social Worker at [phone]   For assessment of decision of mental capacity and competency:  Call Dr. Anthoney Harada, geriatric psychiatrist at [phone]  For guidance in geriatric dementia issues please call [facility] [phone]  For guidance regarding [facility] and if placement were needed at the facility, contact Arnell Asal, Social Worker tel: [phone]  If you have any severe symptoms of a stroke, or other severe issues such as confusion,severe chills or fever, etc call 911 or go to the ER as you may need to be evaluated further   Feel free to visit Facebook page " [email or web address]" for tips of how to care for people with memory problems.       RECOMMENDATIONS FOR ALL PATIENTS WITH MEMORY PROBLEMS: 1. Continue to exercise (Recommend 30 minutes of walking everyday, or 3 hours every week) 2. Increase social interactions - continue going to [place] and enjoy social gatherings with friends and family 3. Eat healthy, avoid fried foods and eat more fruits and vegetables 4. Maintain adequate blood pressure, blood sugar, and blood cholesterol level. Reducing the risk of stroke and cardiovascular disease also helps promoting better memory. 5. Avoid stressful situations. Live a simple life and avoid aggravations. Organize your time and prepare for the next day in anticipation. 6.  Sleep well, avoid any interruptions of sleep and avoid any distractions in the bedroom that may interfere with adequate sleep quality 7. Avoid sugar, avoid sweets as there is a strong link between excessive sugar intake, diabetes, and cognitive impairment We discussed the Mediterranean diet, which has been shown to help patients reduce the risk of progressive memory disorders and reduces cardiovascular risk. This includes eating fish, eat fruits and green leafy vegetables, nuts like almonds and hazelnuts, walnuts, and also use olive oil. Avoid fast foods and fried foods as much as possible. Avoid sweets and sugar as sugar use has been linked to worsening of memory function.  There is always a concern of gradual progression of memory problems. If this is the case, then we may need to adjust level of care according to patient needs. Support, both to the patient and caregiver, should then be put into place.    The Alzheimer's Association is here all day, every day for people facing Alzheimer's disease through our free 24/7 Helpline: [phone]. The Helpline provides reliable information and support to all those who need assistance, such as individuals living with memory loss, Alzheimer's or other dementia, caregivers, health care professionals and the public.  Our highly trained and knowledgeable staff can help you with: Understanding memory loss, dementia and Alzheimer's  Medications and other treatment options  General information about aging and brain health  Skills to provide quality care and to find the best care from professionals  Legal, financial and living-arrangement decisions Our Helpline also features: Confidential care consultation provided by master's level clinicians who can help with decision-making support, crisis assistance and education on  issues families face every day  Help in a caller's preferred language using our translation service that features more than 200 languages and  dialects  Referrals to local community programs, services and ongoing support     FALL PRECAUTIONS: Be cautious when walking. Scan the area for obstacles that may increase the risk of trips and falls. When getting up in the mornings, sit up at the edge of the bed for a few minutes before getting out of bed. Consider elevating the bed at the head end to avoid drop of blood pressure when getting up. Walk always in a well-lit room (use night lights in the walls). Avoid area rugs or power cords from appliances in the middle of the walkways. Use a walker or a cane if necessary and consider physical therapy for balance exercise. Get your eyesight checked regularly.  FINANCIAL OVERSIGHT: Supervision, especially oversight when making financial decisions or transactions is also recommended.  HOME SAFETY: Consider the safety of the kitchen when operating appliances like stoves, microwave oven, and blender. Consider having supervision and share cooking responsibilities until no longer able to participate in those. Accidents with firearms and other hazards in the house should be identified and addressed as well.   ABILITY TO BE LEFT ALONE: If patient is unable to contact 911 operator, consider using LifeLine, or when the need is there, arrange for someone to stay with patients. Smoking is a fire hazard, consider supervision or cessation. Risk of wandering should be assessed by caregiver and if detected at any point, supervision and safe proof recommendations should be instituted.  MEDICATION SUPERVISION: Inability to self-administer medication needs to be constantly addressed. Implement a mechanism to ensure safe administration of the medications.   DRIVING: Regarding driving, in patients with progressive memory problems, driving will be impaired. We advise to have someone else do the driving if trouble finding directions or if minor accidents are reported. Independent driving assessment is available to  determine safety of driving.   If you are interested in the driving assessment, you can contact the following:  [employer] in [place]  [place] [phone] or [phone]      [place] refers to food and lifestyle choices that are based on the traditions of countries located on the [employer]. This way of eating has been shown to help prevent certain conditions and improve outcomes for people who have chronic diseases, like kidney disease and heart disease. What are tips for following this plan? Lifestyle  Cook and eat meals together with your family, when possible. Drink enough fluid to keep your urine clear or pale yellow. Be physically active every day. This includes: Aerobic exercise like running or swimming. Leisure activities like gardening, walking, or housework. Get 7-8 hours of sleep each night. If recommended by your health care provider, drink red wine in moderation. This means 1 glass a day for nonpregnant women and 2 glasses a day for men. A glass of wine equals 5 oz (150 mL). Reading food labels  Check the serving size of packaged foods. For foods such as rice and pasta, the serving size refers to the amount of cooked product, not dry. Check the total fat in packaged foods. Avoid foods that have saturated fat or trans fats. Check the ingredients list for added sugars, such as corn syrup. Shopping  At the grocery store, buy most of your food from the areas near the  walls of the store. This includes: Fresh fruits and vegetables (produce). Grains, beans, nuts, and seeds. Some of these may be available in unpackaged forms or large amounts (in bulk). Fresh seafood. Poultry and eggs. Low-fat dairy products. Buy whole ingredients instead of prepackaged foods. Buy fresh fruits and vegetables in-season from local  farmers markets. Buy frozen fruits and vegetables in resealable bags. If you do not have access to quality fresh seafood, buy precooked frozen shrimp or canned fish, such as tuna, salmon, or sardines. Buy small amounts of raw or cooked vegetables, salads, or olives from the deli or salad bar at your store. Stock your pantry so you always have certain foods on hand, such as olive oil, canned tuna, canned tomatoes, rice, pasta, and beans. Cooking  Cook foods with extra-virgin olive oil instead of using butter or other vegetable oils. Have meat as a side dish, and have vegetables or grains as your main dish. This means having meat in small portions or adding small amounts of meat to foods like pasta or stew. Use beans or vegetables instead of meat in common dishes like chili or lasagna. Experiment with different cooking methods. Try roasting or broiling vegetables instead of steaming or sauteing them. Add frozen vegetables to soups, stews, pasta, or rice. Add nuts or seeds for added healthy fat at each meal. You can add these to yogurt, salads, or vegetable dishes. Marinate fish or vegetables using olive oil, lemon juice, garlic, and fresh herbs. Meal planning  Plan to eat 1 vegetarian meal one day each week. Try to work up to 2 vegetarian meals, if possible. Eat seafood 2 or more times a week. Have healthy snacks readily available, such as: Vegetable sticks with hummus. Greek yogurt. Fruit and nut trail mix. Eat balanced meals throughout the week. This includes: Fruit: 2-3 servings a day Vegetables: 4-5 servings a day Low-fat dairy: 2 servings a day Fish, poultry, or lean meat: 1 serving a day Beans and legumes: 2 or more servings a week Nuts and seeds: 1-2 servings a day Whole grains: 6-8 servings a day Extra-virgin olive oil: 3-4 servings a day Limit red meat and sweets to only a few servings a month What are my food choices? Mediterranean diet Recommended Grains: Whole-grain pasta.  Brown rice. Bulgar wheat. Polenta. Couscous. Whole-wheat bread. [place]. Vegetables: Artichokes. Beets. Broccoli. Cabbage. Carrots. Eggplant. Green beans. Chard. Kale. Spinach. Onions. Leeks. Peas. Squash. Tomatoes. Peppers. Radishes. Fruits: Apples. Apricots. Avocado. Berries. Bananas. Cherries. Dates. Figs. Grapes. Lemons. Melon. Oranges. Peaches. Plums. Pomegranate. Meats and other protein foods: Beans. Almonds. Sunflower seeds. Pine nuts. Peanuts. [place]. Salmon. Scallops. Shrimp. [place]. Tilapia. Clams. Oysters. Eggs. Dairy: Low-fat milk. Cheese. Greek yogurt. Beverages: Water. Red wine. Herbal tea. Fats and oils: Extra virgin olive oil. Avocado oil. Grape seed oil. Sweets and desserts: [place] yogurt with honey. Baked apples. Poached pears. Trail mix. Seasoning and other foods: Basil. Cilantro. Coriander. Cumin. Mint. Parsley. Sage. Rosemary. Tarragon. Garlic. Oregano. Thyme. Pepper. Balsalmic vinegar. Tahini. Hummus. Tomato sauce. Olives. Mushrooms. Limit these Grains: Prepackaged pasta or rice dishes. Prepackaged cereal with added sugar. Vegetables: Deep fried potatoes (french fries). Fruits: Fruit canned in syrup. Meats and other protein foods: Beef. Pork. Lamb. Poultry with skin. Hot dogs. Bernie[place] Salines. Dairy: Ice cream. Sour cream. Whole milk. Beverages: Juice. Sugar-sweetened soft drinks. Beer. Liquor and spirits. Fats and oils: Butter. Canola oil. Vegetable oil. Beef fat (tallow). Lard. Sweets and desserts: Cookies. Cakes. Pies. Candy. Seasoning and other foods: Mayonnaise. Premade sauces and marinades. The items  listed may not be a complete list. Talk with your dietitian about what dietary choices are right for you. Summary The Mediterranean diet includes both food and lifestyle choices. Eat a variety of fresh fruits and vegetables, beans, nuts, seeds, and whole grains. Limit the amount of red meat and sweets that you eat. Talk with your health care provider about whether it is safe  for you to drink red wine in moderation. This means 1 glass a day for nonpregnant women and 2 glasses a day for men. A glass of wine equals 5 oz (150 mL). This information is not intended to replace advice given to you by your health care provider. Make sure you discuss any questions you have with your health care provider. Document Released: 09/20/2015 Document Revised: 10/23/2015 Document Reviewed: 09/20/2015 Elsevier Interactive Patient Education  2017 [employer].

## 2021-10-17 NOTE — Progress Notes (Cosign Needed Addendum)
Assessment/Plan:    The patient is seen in neurologic consultation at the request of Virgie Dad, MD for the evaluation of memory.  [name] is a very pleasant 88 y.o. year old RH female with  a history of hypertension, hyperlipidemia, hyponatremia on salt tabs, [place], hypothyroidism, OH, prior h/o ovarian Ca, CKD3, anemia, insomnia, HOH (B hearing aids) seen today for evaluation of memory loss. MMSE is 18/30 .   Dementia due to Alzheimer's Disease  MRI brain without contrast to assess for underlying structural abnormality and assess vascular load has been scheduled by PCP on 10/27/2021.  Will await for the results. Patient is at ALF, which is appropriate, as the patient needs 24/7 monitoring given her cognitive decline [place] up in 2 months     Subjective:    The patient is accompanied by her daughter who supplements the history.    How long did patient have memory difficulties?  Unclear the timing, however they still have at ALF noticed she was having a hard time with date and season, as well as "mixing up day and night ".  1 day she slept during the day, while Thinking it was the morning.  Daughter and patient are unsure if she forgets new information.    Patient lives with: [facility].  Patient enjoys all the activities that the facility provides.  She feels safe there. repeats oneself?  Endorsed.  During this visit she keeps repeating "why am I here " Disoriented when walking into a room?  Patient denies   Leaving objects in unusual places?  Patient denies   Ambulates  with difficulty?   Uses a walker as needed to ambulate due to some gait instability. Recent falls?  1 week ago, she sustained mechanical fall without loss of consciousness or head injury Any head injuries? Concussion due to low sodium in 2022.  She is on sodium tablets now. History of seizures?   Patient denies   Wandering behavior?  Patient denies   Patient drives?   Patient no longer drives    Any mood changes such irritability agitation?  Patient denies   Any history of depression?:  Patient denies   Hallucinations?  Patient denies   Paranoia?  Patient denies   Patient reports that sleeps well without vivid dreams, REM behavior or sleepwalking    History of sleep apnea?  Patient denies   Any hygiene concerns?  Patient denies   Independent of bathing and dressing?  Endorsed  Does the patient needs help with medications? ALF provides her medication Who is in charge of the finances?  Son  is in charge   Any changes in appetite?  Snacks more than before.  Her daughter attributed to decreased level of activity. Patient have trouble swallowing? Patient denies   Does the patient cook?  Patient denies   Any kitchen accidents such as leaving the stove on?  She does not cook Any headaches?  Patient denies   Double vision? Patient denies   Any focal numbness or tingling?  Patient denies   Chronic back pain Patient denies   Unilateral weakness?  Patient denies   Any tremors?  Patient denies   Any history of anosmia?  Patient denies   Any incontinence of urine? History of urine frequency.she had recent acute cystitis on 09/16/2021, as well as UTI, completing a cycle of Cipro for 7 days.  She is also on Myrbetriq.   Any bowel dysfunction?   Patient denies  History of heavy alcohol intake?  Patient denies   History of heavy tobacco use?  Patient denies   Family history of dementia?  Patient denies   Allergies  Allergen Reactions   Morphine And Related Anaphylaxis and Other (See Comments)    Given after knee replacement and code blue occurred   Morphine Sulfate Anaphylaxis   Penicillins Hives, Itching and Other (See Comments)    Syncope, also   Nsaids Other (See Comments)    Because of an abnormal kidney test result   Penicillin V Hives   Shellfish Allergy Nausea And Vomiting and Other (See Comments)    Pt has shellfish allergy only.  Has had IV contrast x 2 and did fine.    Shellfish-Derived Products Nausea And Vomiting    Current Outpatient Medications  Medication Instructions   acetaminophen (TYLENOL) 1,000 mg, Oral, Every 8 hours PRN   azelastine (ASTELIN) 0.1 % nasal spray 1 spray, Each Nare, Daily PRN   B Complex-C-Folic Acid (B-COMPLEX/FOLIC ACID/VITAMIN C PO) 1 tablet, Oral, Daily with breakfast   bimatoprost (LUMIGAN) 0.03 % ophthalmic solution 1 drop, Both Eyes, Daily at bedtime   calcium carbonate (TUMS EX) 750 mg, Oral, Daily PRN   cycloSPORINE (RESTASIS) 0.05 % ophthalmic emulsion 1 drop, Both Eyes, 2 times daily   diclofenac Sodium (VOLTAREN) 2 g, Topical, 2 times daily   docusate sodium (COLACE) 100 mg, Oral, Daily   hydrALAZINE (APRESOLINE) 10 mg, Oral, 2 times daily PRN   levothyroxine (SYNTHROID) 25 mcg, Oral, Daily before breakfast   meclizine (ANTIVERT) 12.5 mg, Oral, 2 times daily PRN   melatonin 3 mg, Oral, Daily at bedtime   miconazole (MICOTIN) 2 % powder Topical, Daily, Patient may apply to dry skin under breasts daily and after showering    mirabegron ER (MYRBETRIQ) 25 mg, Oral, Daily   nystatin (MYCOSTATIN/NYSTOP) powder 1 application , Topical, 2 times daily PRN   pantoprazole (PROTONIX) 40 mg, Oral, 2 times daily   sodium chloride 1 g, Oral, 2 times daily   Vitamin D3 2,000 Units, Oral, Every morning   zinc oxide 20 % ointment 1 application , Topical, As needed     VITALS:  There were no vitals filed for this visit.    08/23/2021    1:06 PM 08/16/2020    3:42 PM  Depression screen PHQ 2/9  Decreased Interest 0 0  Down, Depressed, Hopeless 0 0  PHQ - 2 Score 0 0    PHYSICAL EXAM   HEENT:  Normocephalic, atraumatic. The mucous membranes are moist. The superficial temporal arteries are without ropiness or tenderness. Cardiovascular: Regular rate and rhythm. Lungs: Clear to auscultation bilaterally. Neck: There are no carotid bruits noted bilaterally.  NEUROLOGICAL:     No data to display             10/17/2021    10:00 AM 08/23/2021    1:08 PM 08/16/2020    3:50 PM  MMSE - Mini Mental State Exam  Orientation to time '3 4 5  '$ Orientation to Place '4 5 5  '$ Registration '2 3 3  '$ Attention/ Calculation 0 5 5  Recall 0 2 3  Language- name 2 objects '2 2 2  '$ Language- repeat '1 1 1  '$ Language- follow 3 step command '3 3 3  '$ Language- read & follow direction '1 1 1  '$ Write a sentence '1 1 1  '$ Copy design '1 1 1  '$ Total score '18 28 30     '$ Orientation:  Alert and oriented  to person, place and time. No aphasia or dysarthria. Fund of knowledge is appropriate. Recent memory impaired and remote memory intact.  Attention and concentration are normal.  Able to name objects and repeat phrases. Delayed recall 0/3   Cranial nerves: There is good facial symmetry. Extraocular muscles are intact and visual fields are full to confrontational testing. Speech is fluent and clear. Soft palate rises symmetrically and there is no tongue deviation. Hearing is intact to conversational tone. Tone: Tone is good throughout. Sensation: Sensation is intact to light touch and pinprick throughout. Vibration is intact at the bilateral big toe. There is no extinction with double simultaneous stimulation. There is no sensory dermatomal level identified. Coordination: The patient has no difficulty with RAM's or FNF bilaterally. Normal finger to nose  Motor: Strength is 5/5 in the bilateral upper and lower extremities. There is no pronator drift. There are no fasciculations noted. DTR's: Deep tendon reflexes are 2/4 at the bilateral biceps, triceps, brachioradialis, patella and achilles.  Plantar responses are downgoing bilaterally. Gait and Station: The patient is in wheelchair, has difficulty standing up, gait and walking unable to be tested.      Thank you for allowing [place] the opportunity to participate in the care of this nice patient. Please do not hesitate to contact us for any questions or concerns.   Total time spent on today's visit was 45 minutes  dedicated to this patient today, preparing to see patient, examining the patient, ordering tests and/or medications and counseling the patient, documenting clinical information in the EHR or other health record, independently interpreting results and communicating results to the patient/family, discussing treatment and goals, answering patient's questions and coordinating care.  Cc:  Virgie Dad, MD  Sharene Butters 10/17/2021 2:09 PM

## 2021-10-27 ENCOUNTER — Ambulatory Visit
Admission: RE | Admit: 2021-10-27 | Discharge: 2021-10-27 | Disposition: A | Discharge status: Home / Self Care | Payer: Medicare Other | Source: Ambulatory Visit | Attending: Internal Medicine | Admitting: Internal Medicine

## 2021-10-27 ENCOUNTER — Other Ambulatory Visit: Payer: Self-pay | Admitting: Internal Medicine

## 2021-10-27 DIAGNOSIS — I4892 Unspecified atrial flutter: Secondary | ICD-10-CM

## 2021-10-27 DIAGNOSIS — R4189 Other symptoms and signs involving cognitive functions and awareness: Secondary | ICD-10-CM

## 2021-10-31 ENCOUNTER — Telehealth: Payer: Self-pay

## 2021-10-31 NOTE — Telephone Encounter (Signed)
I tried to contact patient and daughter, mail is full. Was trying to get patient scheduled for 10:00 tomorrow. Will call back.

## 2021-11-07 ENCOUNTER — Encounter: Payer: Self-pay | Admitting: Physician Assistant

## 2021-11-07 ENCOUNTER — Ambulatory Visit (INDEPENDENT_AMBULATORY_CARE_PROVIDER_SITE_OTHER): Payer: Medicare Other | Admitting: Physician Assistant

## 2021-11-07 VITALS — Resp 20 | Ht 63.0 in | Wt 200.0 lb

## 2021-11-07 DIAGNOSIS — F0280 Dementia in other diseases classified elsewhere without behavioral disturbance: Secondary | ICD-10-CM | POA: Diagnosis not present

## 2021-11-07 DIAGNOSIS — F0150 Vascular dementia without behavioral disturbance: Secondary | ICD-10-CM

## 2021-11-07 DIAGNOSIS — G309 Alzheimer's disease, unspecified: Secondary | ICD-10-CM

## 2021-11-07 DIAGNOSIS — F0390 Unspecified dementia without behavioral disturbance: Secondary | ICD-10-CM | POA: Insufficient documentation

## 2021-11-07 NOTE — Patient Instructions (Addendum)
It was a pleasure to see you today at our office.   Recommendations:     Continue 24/7 monitoring   Recommend good control of cardiovascular risk factors.  Recommend baby aspirin daily  Follow up Jan 4 at 11:30  Recommend routine control of her recurrent UTIs and Sodium                                                                                      Whom to call:  Memory  decline, memory medications: Call our office [phone]   For psychiatric meds, mood meds: Please have your primary care physician manage these medications.   Counseling regarding caregiver distress, including caregiver depression, anxiety and issues regarding community resources, adult day care programs, adult living facilities, or memory care questions:   Feel free to contact [facility], Social Worker at [phone]   For assessment of decision of mental capacity and competency:  Call Dr. Anthoney Harada, geriatric psychiatrist at [phone]  For guidance in geriatric dementia issues please call [facility] [phone]  For guidance regarding [facility] and if placement were needed at the facility, contact Arnell Asal, Social Worker tel: [phone]  If you have any severe symptoms of a stroke, or other severe issues such as confusion,severe chills or fever, etc call 911 or go to the ER as you may need to be evaluated further   Feel free to visit Facebook page " [email or web address]" for tips of how to care for people with memory problems.       RECOMMENDATIONS FOR ALL PATIENTS WITH MEMORY PROBLEMS: 1. Continue to exercise (Recommend 30 minutes of walking everyday, or 3 hours every week) 2. Increase social interactions - continue going to [place] and enjoy social gatherings with friends and family 3. Eat healthy, avoid fried foods and eat more fruits and vegetables 4. Maintain adequate blood pressure, blood sugar, and blood cholesterol level. Reducing the risk of stroke  and cardiovascular disease also helps promoting better memory. 5. Avoid stressful situations. Live a simple life and avoid aggravations. Organize your time and prepare for the next day in anticipation. 6. Sleep well, avoid any interruptions of sleep and avoid any distractions in the bedroom that may interfere with adequate sleep quality 7. Avoid sugar, avoid sweets as there is a strong link between excessive sugar intake, diabetes, and cognitive impairment We discussed the Mediterranean diet, which has been shown to help patients reduce the risk of progressive memory disorders and reduces cardiovascular risk. This includes eating fish, eat fruits and green leafy vegetables, nuts like almonds and hazelnuts, walnuts, and also use olive oil. Avoid fast foods and fried foods as much as possible. Avoid sweets and sugar as sugar use has been linked to worsening of memory function.  There is always a concern of gradual progression of memory problems. If this is the case, then we may need to adjust level of care according to patient needs. Support, both to the patient and caregiver, should then be put into place.    The Alzheimer's Association is here all day, every day for people facing Alzheimer's disease through our free 24/7 Helpline: [phone]. The  Helpline provides reliable information and support to all those who need assistance, such as individuals living with memory loss, Alzheimer's or other dementia, caregivers, health care professionals and the public.  Our highly trained and knowledgeable staff can help you with: Understanding memory loss, dementia and Alzheimer's  Medications and other treatment options  General information about aging and brain health  Skills to provide quality care and to find the best care from professionals  Legal, financial and living-arrangement decisions Our Helpline also features: Confidential care consultation provided by master's level clinicians who can help with  decision-making support, crisis assistance and education on issues families face every day  Help in a caller's preferred language using our translation service that features more than 200 languages and dialects  Referrals to local community programs, services and ongoing support     FALL PRECAUTIONS: Be cautious when walking. Scan the area for obstacles that may increase the risk of trips and falls. When getting up in the mornings, sit up at the edge of the bed for a few minutes before getting out of bed. Consider elevating the bed at the head end to avoid drop of blood pressure when getting up. Walk always in a well-lit room (use night lights in the walls). Avoid area rugs or power cords from appliances in the middle of the walkways. Use a walker or a cane if necessary and consider physical therapy for balance exercise. Get your eyesight checked regularly.  FINANCIAL OVERSIGHT: Supervision, especially oversight when making financial decisions or transactions is also recommended.  HOME SAFETY: Consider the safety of the kitchen when operating appliances like stoves, microwave oven, and blender. Consider having supervision and share cooking responsibilities until no longer able to participate in those. Accidents with firearms and other hazards in the house should be identified and addressed as well.   ABILITY TO BE LEFT ALONE: If patient is unable to contact 911 operator, consider using LifeLine, or when the need is there, arrange for someone to stay with patients. Smoking is a fire hazard, consider supervision or cessation. Risk of wandering should be assessed by caregiver and if detected at any point, supervision and safe proof recommendations should be instituted.  MEDICATION SUPERVISION: Inability to self-administer medication needs to be constantly addressed. Implement a mechanism to ensure safe administration of the medications.   DRIVING: Regarding driving, in patients with progressive  memory problems, driving will be impaired. We advise to have someone else do the driving if trouble finding directions or if minor accidents are reported. Independent driving assessment is available to determine safety of driving.     Mediterranean Diet A Mediterranean diet refers to food and lifestyle choices that are based on the traditions of countries located on the [employer]. This way of eating has been shown to help prevent certain conditions and improve outcomes for people who have chronic diseases, like kidney disease and heart disease. What are tips for following this plan? Lifestyle  Cook and eat meals together with your family, when possible. Drink enough fluid to keep your urine clear or pale yellow. Be physically active every day. This includes: Aerobic exercise like running or swimming. Leisure activities like gardening, walking, or housework. Get 7-8 hours of sleep each night. If recommended by your health care provider, drink red wine in moderation. This means 1 glass a day for nonpregnant women and 2 glasses a day for men. A glass of wine equals 5 oz (150 mL). Reading food labels  Check the serving size of  packaged foods. For foods such as rice and pasta, the serving size refers to the amount of cooked product, not dry. Check the total fat in packaged foods. Avoid foods that have saturated fat or trans fats. Check the ingredients list for added sugars, such as corn syrup. Shopping  At the grocery store, buy most of your food from the areas near the walls of the store. This includes: Fresh fruits and vegetables (produce). Grains, beans, nuts, and seeds. Some of these may be available in unpackaged forms or large amounts (in bulk). Fresh seafood. Poultry and eggs. Low-fat dairy products. Buy whole ingredients instead of prepackaged foods. Buy fresh fruits and vegetables in-season from local farmers markets. Buy frozen fruits and vegetables in resealable bags. If  you do not have access to quality fresh seafood, buy precooked frozen shrimp or canned fish, such as tuna, salmon, or sardines. Buy small amounts of raw or cooked vegetables, salads, or olives from the deli or salad bar at your store. Stock your pantry so you always have certain foods on hand, such as olive oil, canned tuna, canned tomatoes, rice, pasta, and beans. Cooking  Cook foods with extra-virgin olive oil instead of using butter or other vegetable oils. Have meat as a side dish, and have vegetables or grains as your main dish. This means having meat in small portions or adding small amounts of meat to foods like pasta or stew. Use beans or vegetables instead of meat in common dishes like chili or lasagna. Experiment with different cooking methods. Try roasting or broiling vegetables instead of steaming or sauteing them. Add frozen vegetables to soups, stews, pasta, or rice. Add nuts or seeds for added healthy fat at each meal. You can add these to yogurt, salads, or vegetable dishes. Marinate fish or vegetables using olive oil, lemon juice, garlic, and fresh herbs. Meal planning  Plan to eat 1 vegetarian meal one day each week. Try to work up to 2 vegetarian meals, if possible. Eat seafood 2 or more times a week. Have healthy snacks readily available, such as: Vegetable sticks with hummus. Greek yogurt. Fruit and nut trail mix. Eat balanced meals throughout the week. This includes: Fruit: 2-3 servings a day Vegetables: 4-5 servings a day Low-fat dairy: 2 servings a day Fish, poultry, or lean meat: 1 serving a day Beans and legumes: 2 or more servings a week Nuts and seeds: 1-2 servings a day Whole grains: 6-8 servings a day Extra-virgin olive oil: 3-4 servings a day Limit red meat and sweets to only a few servings a month What are my food choices? Mediterranean diet Recommended Grains: Whole-grain pasta. Brown rice. Bulgar wheat. Polenta. Couscous. Whole-wheat bread. [place]. Vegetables: Artichokes. Beets. Broccoli. Cabbage. Carrots. Eggplant. Green beans. Chard. Kale. Spinach. Onions. Leeks. Peas. Squash. Tomatoes. Peppers. Radishes. Fruits: Apples. Apricots. Avocado. Berries. Bananas. Cherries. Dates. Figs. Grapes. Lemons. Melon. Oranges. Peaches. Plums. Pomegranate. Meats and other protein foods: Beans. Almonds. Sunflower seeds. Pine nuts. Peanuts. [place]. Salmon. Scallops. Shrimp. [place]. Tilapia. Clams. Oysters. Eggs. Dairy: Low-fat milk. Cheese. Greek yogurt. Beverages: Water. Red wine. Herbal tea. Fats and oils: Extra virgin olive oil. Avocado oil. Grape seed oil. Sweets and desserts: [place] yogurt with honey. Baked apples. Poached pears. Trail mix. Seasoning and other foods: Basil. Cilantro. Coriander. Cumin. Mint. Parsley. Sage. Rosemary. Tarragon. Garlic. Oregano. Thyme. Pepper. Balsalmic vinegar. Tahini. Hummus. Tomato sauce. Olives. Mushrooms. Limit these Grains: Prepackaged pasta or rice dishes. Prepackaged cereal with added sugar. Vegetables: Deep fried potatoes (french fries). Fruits:  Fruit canned in syrup. Meats and other protein foods: Beef. Pork. Lamb. Poultry with skin. Hot dogs. [place] Salines. Dairy: Ice cream. Sour cream. Whole milk. Beverages: Juice. Sugar-sweetened soft drinks. Beer. Liquor and spirits. Fats and oils: Butter. Canola oil. Vegetable oil. Beef fat (tallow). Lard. Sweets and desserts: Cookies. Cakes. Pies. Candy. Seasoning and other foods: Mayonnaise. Premade sauces and marinades. The items listed may not be a complete list. Talk with your dietitian about what dietary choices are right for you. Summary The Mediterranean diet includes both food and lifestyle choices. Eat a variety of fresh fruits and vegetables, beans, nuts, seeds, and whole grains. Limit the amount of red meat and sweets that you eat. Talk with your health care provider about whether it is safe for you to drink red wine in moderation. This means 1 glass a day for  nonpregnant women and 2 glasses a day for men. A glass of wine equals 5 oz (150 mL). This information is not intended to replace advice given to you by your health care provider. Make sure you discuss any questions you have with your health care provider.

## 2021-11-07 NOTE — Progress Notes (Signed)
Assessment/Plan:   Dementia likely due to Mixed Vascular and Alzheimer's Disease   [name] is a very pleasant 88 y.o. RH female  with  a history of hypertension, hyperlipidemia, hyponatremia on salt tabs, Aflutter, hypothyroidism, OH, prior h/o ovarian Ca, CKD3, anemia, insomnia, HOH (B hearing aids) seen today prior to her initial planned follow-up in October, due to abnormal findings on the MRI of the brain for 10/27/2021, ordered by her PCP.  This was remarkable for small acute infarct in the left frontal periventricular white matter, with a background of atrophy and extensive chronic small vessel ischemia.  Last  MoCA on 10/17/21 was 18/30  Prior to her visit, these results were discussed via telephone call with her daughter the risk and benefits of proceeding with invasive studies in view of the patient's advanced age and other multiple medical problems.  It is felt prudent to have a very strong control of her cardiovascular risk factors in place of invasive procedures.      Continue 24/7 monitoring at ALF Recommend good control of cardiovascular risk factors.   Continue baby aspirin daily  Follow up Jan 4 at 11:30  Recommend routine control of her recurrent UTIs and Sodium        Recommend mediterranean diet                                                                                    Subjective:    This patient is accompanied in the office by her daughter who supplements the history.  Previous records as well as any outside records available were reviewed prior to todays visit.    Any changes in memory since last visit? No changes are noted   Initial visit  10/17/2021  How long did patient have memory difficulties?  Unclear the timing, however they still have at ALF noticed she was having a hard time with date and season, as well as "mixing up day and night ".  1 day she slept during the day, while Thinking it was the morning.  Daughter and patient are unsure if she  forgets new information.   Patient lives with: [facility].  Patient enjoys all the activities that the facility provides.  She feels safe there. repeats oneself?  Endorsed.  During this visit she keeps repeating "why am I here " Disoriented when walking into a room?  Patient denies   Leaving objects in unusual places?  Patient denies   Ambulates  with difficulty?   Uses a walker as needed to ambulate due to some gait instability. Recent falls?  1 week ago, she sustained mechanical fall without loss of consciousness or head injury Any head injuries? Concussion due to low sodium in 2022.  She is on sodium tablets now. History of seizures?   Patient denies   Wandering behavior?  Patient denies   Patient drives?   Patient no longer drives   Any mood changes such irritability agitation?  Patient denies   Any history of depression?:  Patient denies   Hallucinations?  Patient denies   Paranoia?  Patient denies   Patient reports that sleeps well without vivid dreams, REM behavior  or sleepwalking    History of sleep apnea?  Patient denies   Any hygiene concerns?  Patient denies   Independent of bathing and dressing?  Endorsed  Does the patient needs help with medications? ALF provides her medication Who is in charge of the finances?  Son  is in charge   Any changes in appetite?  Snacks more than before.  Her daughter attributed to decreased level of activity. Patient have trouble swallowing? Patient denies   Does the patient cook?  Patient denies   Any kitchen accidents such as leaving the stove on?  She does not cook Any headaches?  Patient denies   Double vision? Patient denies   Any focal numbness or tingling?  Patient denies   Chronic back pain Patient denies   Unilateral weakness?  Patient denies   Any tremors?  Patient denies   Any history of anosmia?  Patient denies   Any incontinence of urine? History of urine frequency.she had recent acute cystitis on 09/16/2021, as well as UTI,  completing a cycle of Cipro for 7 days.  She is also on Myrbetriq.   Any bowel dysfunction?   Patient denies   History of heavy alcohol intake?  Patient denies   History of heavy tobacco use?  Patient denies   Family history of dementia?  Patient denies  CURRENT MEDICATIONS:  Outpatient Encounter Medications as of 11/07/2021  Medication Sig   acetaminophen (TYLENOL) 500 MG tablet Take 1,000 mg by mouth every 8 (eight) hours as needed.   azelastine (ASTELIN) 0.1 % nasal spray Place 1 spray into both nostrils daily as needed for rhinitis or allergies.   B Complex-C-Folic Acid (B-COMPLEX/FOLIC ACID/VITAMIN C PO) Take 1 tablet by mouth daily with breakfast.   bimatoprost (LUMIGAN) 0.03 % ophthalmic solution Place 1 drop into both eyes at bedtime.   calcium carbonate (TUMS EX) 750 MG chewable tablet Chew 750 mg by mouth daily as needed for heartburn.   Cholecalciferol (VITAMIN D3) 50 MCG (2000 UT) TABS Take 2,000 Units by mouth in the morning.   cycloSPORINE (RESTASIS) 0.05 % ophthalmic emulsion Place 1 drop into both eyes 2 (two) times daily.   diclofenac Sodium (VOLTAREN) 1 % GEL Apply 2 g topically in the morning and at bedtime.   docusate sodium (COLACE) 100 MG capsule Take 100 mg by mouth daily.   hydrALAZINE (APRESOLINE) 10 MG tablet Take 10 mg by mouth 2 (two) times daily as needed.   levothyroxine (SYNTHROID) 25 MCG tablet Take 25 mcg by mouth daily before breakfast.   meclizine (ANTIVERT) 12.5 MG tablet Take 1 tablet (12.5 mg total) by mouth 2 (two) times daily as needed for dizziness.   melatonin 3 MG TABS tablet Take 1 tablet (3 mg total) by mouth at bedtime.   miconazole (MICOTIN) 2 % powder Apply topically daily. Patient may apply to dry skin under breasts daily and after showering   mirabegron ER (MYRBETRIQ) 25 MG TB24 tablet Take 25 mg by mouth daily.   nystatin (MYCOSTATIN/NYSTOP) powder Apply 1 application topically 2 (two) times daily as needed.   pantoprazole (PROTONIX) 40 MG  tablet Take 1 tablet (40 mg total) by mouth 2 (two) times daily.   sodium chloride 1 g tablet Take 1 g by mouth 2 (two) times daily.   zinc oxide 20 % ointment Apply 1 application topically as needed (for redness- to be applied to buttocks/peri area and after every incontinent episode).   No facility-administered encounter medications on file as of 11/07/2021.  10/17/2021   10:00 AM 08/23/2021    1:08 PM 08/16/2020    3:50 PM  MMSE - Mini Mental State Exam  Orientation to time '3 4 5  '$ Orientation to Place '4 5 5  '$ Registration '2 3 3  '$ Attention/ Calculation 0 5 5  Recall 0 2 3  Language- name 2 objects '2 2 2  '$ Language- repeat '1 1 1  '$ Language- follow 3 step command '3 3 3  '$ Language- read & follow direction '1 1 1  '$ Write a sentence '1 1 1  '$ Copy design '1 1 1  '$ Total score '18 28 30       '$ No data to display           Thank you for allowing [place] the opportunity to participate in the care of this nice patient. Please do not hesitate to contact us for any questions or concerns.   Total time spent on today's visit was 35 minutes dedicated to this patient today, preparing to see patient, examining the patient, ordering tests and/or medications and counseling the patient, documenting clinical information in the EHR or other health record, independently interpreting results and communicating results to the patient/family, discussing treatment and goals, answering patient's questions and coordinating care.  Cc:  Virgie Dad, MD  Sharene Butters 11/07/2021 7:37 AM

## 2021-11-24 ENCOUNTER — Telehealth: Payer: Self-pay | Admitting: Adult Health

## 2021-11-24 ENCOUNTER — Inpatient Hospital Stay (HOSPITAL_COMMUNITY)
Admission: EM | Admit: 2021-11-24 | Discharge: 2021-11-27 | DRG: 065 | Disposition: A | Discharge status: Skilled Nursing Facility | Payer: Medicare Other | Source: Skilled Nursing Facility | Attending: Hospitalist | Admitting: Hospitalist

## 2021-11-24 ENCOUNTER — Inpatient Hospital Stay (HOSPITAL_COMMUNITY): Payer: Medicare Other

## 2021-11-24 ENCOUNTER — Emergency Department (HOSPITAL_COMMUNITY): Payer: Medicare Other

## 2021-11-24 ENCOUNTER — Encounter (HOSPITAL_COMMUNITY): Payer: Self-pay | Admitting: *Deleted

## 2021-11-24 ENCOUNTER — Other Ambulatory Visit: Payer: Self-pay

## 2021-11-24 DIAGNOSIS — Z9181 History of falling: Secondary | ICD-10-CM

## 2021-11-24 DIAGNOSIS — K219 Gastro-esophageal reflux disease without esophagitis: Secondary | ICD-10-CM | POA: Diagnosis present

## 2021-11-24 DIAGNOSIS — F01C0 Vascular dementia, severe, without behavioral disturbance, psychotic disturbance, mood disturbance, and anxiety: Secondary | ICD-10-CM | POA: Diagnosis present

## 2021-11-24 DIAGNOSIS — Z87891 Personal history of nicotine dependence: Secondary | ICD-10-CM

## 2021-11-24 DIAGNOSIS — E669 Obesity, unspecified: Secondary | ICD-10-CM | POA: Diagnosis present

## 2021-11-24 DIAGNOSIS — I129 Hypertensive chronic kidney disease with stage 1 through stage 4 chronic kidney disease, or unspecified chronic kidney disease: Secondary | ICD-10-CM | POA: Diagnosis present

## 2021-11-24 DIAGNOSIS — M810 Age-related osteoporosis without current pathological fracture: Secondary | ICD-10-CM | POA: Diagnosis present

## 2021-11-24 DIAGNOSIS — E039 Hypothyroidism, unspecified: Secondary | ICD-10-CM | POA: Diagnosis present

## 2021-11-24 DIAGNOSIS — Z8673 Personal history of transient ischemic attack (TIA), and cerebral infarction without residual deficits: Secondary | ICD-10-CM | POA: Diagnosis not present

## 2021-11-24 DIAGNOSIS — R4781 Slurred speech: Secondary | ICD-10-CM | POA: Diagnosis present

## 2021-11-24 DIAGNOSIS — G309 Alzheimer's disease, unspecified: Secondary | ICD-10-CM | POA: Diagnosis present

## 2021-11-24 DIAGNOSIS — F02C0 Dementia in other diseases classified elsewhere, severe, without behavioral disturbance, psychotic disturbance, mood disturbance, and anxiety: Secondary | ICD-10-CM | POA: Diagnosis present

## 2021-11-24 DIAGNOSIS — R29706 NIHSS score 6: Secondary | ICD-10-CM | POA: Diagnosis present

## 2021-11-24 DIAGNOSIS — E871 Hypo-osmolality and hyponatremia: Secondary | ICD-10-CM | POA: Diagnosis present

## 2021-11-24 DIAGNOSIS — R4182 Altered mental status, unspecified: Principal | ICD-10-CM

## 2021-11-24 DIAGNOSIS — E785 Hyperlipidemia, unspecified: Secondary | ICD-10-CM | POA: Diagnosis present

## 2021-11-24 DIAGNOSIS — Z880 Allergy status to penicillin: Secondary | ICD-10-CM

## 2021-11-24 DIAGNOSIS — Z6834 Body mass index (BMI) 34.0-34.9, adult: Secondary | ICD-10-CM | POA: Diagnosis not present

## 2021-11-24 DIAGNOSIS — F0390 Unspecified dementia without behavioral disturbance: Secondary | ICD-10-CM | POA: Diagnosis not present

## 2021-11-24 DIAGNOSIS — N1831 Chronic kidney disease, stage 3a: Secondary | ICD-10-CM | POA: Diagnosis present

## 2021-11-24 DIAGNOSIS — Z833 Family history of diabetes mellitus: Secondary | ICD-10-CM

## 2021-11-24 DIAGNOSIS — R296 Repeated falls: Secondary | ICD-10-CM | POA: Diagnosis present

## 2021-11-24 DIAGNOSIS — Z8249 Family history of ischemic heart disease and other diseases of the circulatory system: Secondary | ICD-10-CM | POA: Diagnosis not present

## 2021-11-24 DIAGNOSIS — I63432 Cerebral infarction due to embolism of left posterior cerebral artery: Secondary | ICD-10-CM | POA: Diagnosis present

## 2021-11-24 DIAGNOSIS — I10 Essential (primary) hypertension: Secondary | ICD-10-CM | POA: Diagnosis present

## 2021-11-24 DIAGNOSIS — N179 Acute kidney failure, unspecified: Secondary | ICD-10-CM | POA: Diagnosis present

## 2021-11-24 DIAGNOSIS — F0280 Dementia in other diseases classified elsewhere without behavioral disturbance: Secondary | ICD-10-CM | POA: Diagnosis present

## 2021-11-24 DIAGNOSIS — N183 Chronic kidney disease, stage 3 unspecified: Secondary | ICD-10-CM | POA: Diagnosis not present

## 2021-11-24 DIAGNOSIS — Z8543 Personal history of malignant neoplasm of ovary: Secondary | ICD-10-CM

## 2021-11-24 DIAGNOSIS — I639 Cerebral infarction, unspecified: Secondary | ICD-10-CM | POA: Diagnosis not present

## 2021-11-24 DIAGNOSIS — H409 Unspecified glaucoma: Secondary | ICD-10-CM | POA: Diagnosis present

## 2021-11-24 DIAGNOSIS — I4892 Unspecified atrial flutter: Secondary | ICD-10-CM | POA: Diagnosis present

## 2021-11-24 DIAGNOSIS — Z91013 Allergy to seafood: Secondary | ICD-10-CM

## 2021-11-24 DIAGNOSIS — F0150 Vascular dementia without behavioral disturbance: Secondary | ICD-10-CM | POA: Diagnosis present

## 2021-11-24 DIAGNOSIS — R471 Dysarthria and anarthria: Secondary | ICD-10-CM | POA: Diagnosis present

## 2021-11-24 DIAGNOSIS — Z79890 Hormone replacement therapy (postmenopausal): Secondary | ICD-10-CM

## 2021-11-24 DIAGNOSIS — M170 Bilateral primary osteoarthritis of knee: Secondary | ICD-10-CM | POA: Diagnosis present

## 2021-11-24 DIAGNOSIS — Z886 Allergy status to analgesic agent status: Secondary | ICD-10-CM

## 2021-11-24 DIAGNOSIS — Z79899 Other long term (current) drug therapy: Secondary | ICD-10-CM

## 2021-11-24 DIAGNOSIS — G459 Transient cerebral ischemic attack, unspecified: Secondary | ICD-10-CM | POA: Insufficient documentation

## 2021-11-24 DIAGNOSIS — Z885 Allergy status to narcotic agent status: Secondary | ICD-10-CM

## 2021-11-24 LAB — CBC WITH DIFFERENTIAL/PLATELET
Abs Immature Granulocytes: 0.01 10*3/uL (ref 0.00–0.07)
Basophils Absolute: 0.1 10*3/uL (ref 0.0–0.1)
Basophils Relative: 1 %
Eosinophils Absolute: 0.1 10*3/uL (ref 0.0–0.5)
Eosinophils Relative: 1 %
HCT: 44.4 % (ref 36.0–46.0)
Hemoglobin: 13.9 g/dL (ref 12.0–15.0)
Immature Granulocytes: 0 %
Lymphocytes Relative: 11 %
Lymphs Abs: 0.9 10*3/uL (ref 0.7–4.0)
MCH: 26.5 pg (ref 26.0–34.0)
MCHC: 31.3 g/dL (ref 30.0–36.0)
MCV: 84.6 fL (ref 80.0–100.0)
Monocytes Absolute: 0.6 10*3/uL (ref 0.1–1.0)
Monocytes Relative: 7 %
Neutro Abs: 6.6 10*3/uL (ref 1.7–7.7)
Neutrophils Relative %: 80 %
Platelets: 237 10*3/uL (ref 150–400)
RBC: 5.25 MIL/uL — ABNORMAL HIGH (ref 3.87–5.11)
RDW: 15.2 % (ref 11.5–15.5)
WBC: 8.2 10*3/uL (ref 4.0–10.5)
nRBC: 0 % (ref 0.0–0.2)

## 2021-11-24 LAB — URINALYSIS, ROUTINE W REFLEX MICROSCOPIC
Bilirubin Urine: NEGATIVE
Glucose, UA: NEGATIVE mg/dL
Hgb urine dipstick: NEGATIVE
Ketones, ur: NEGATIVE mg/dL
Leukocytes,Ua: NEGATIVE
Nitrite: NEGATIVE
Protein, ur: 100 mg/dL — AB
Specific Gravity, Urine: 1.01 (ref 1.005–1.030)
pH: 7 (ref 5.0–8.0)

## 2021-11-24 LAB — COMPREHENSIVE METABOLIC PANEL
ALT: 16 U/L (ref 0–44)
AST: 23 U/L (ref 15–41)
Albumin: 4.2 g/dL (ref 3.5–5.0)
Alkaline Phosphatase: 67 U/L (ref 38–126)
Anion gap: 8 (ref 5–15)
BUN: 22 mg/dL (ref 8–23)
CO2: 26 mmol/L (ref 22–32)
Calcium: 9.5 mg/dL (ref 8.9–10.3)
Chloride: 101 mmol/L (ref 98–111)
Creatinine, Ser: 1.2 mg/dL — ABNORMAL HIGH (ref 0.44–1.00)
GFR, Estimated: 43 mL/min — ABNORMAL LOW (ref >60)
Glucose, Bld: 122 mg/dL — ABNORMAL HIGH (ref 70–99)
Potassium: 3.9 mmol/L (ref 3.5–5.1)
Sodium: 135 mmol/L (ref 135–145)
Total Bilirubin: 0.6 mg/dL (ref 0.3–1.2)
Total Protein: 8.5 g/dL — ABNORMAL HIGH (ref 6.5–8.1)

## 2021-11-24 MED ORDER — LORAZEPAM 2 MG/ML IJ SOLN
0.5000 mg | Freq: Once | INTRAMUSCULAR | Status: AC
  Administered 2021-11-24: 0.5 mg via INTRAMUSCULAR

## 2021-11-24 MED ORDER — LEVETIRACETAM 250 MG PO TABS
250.0000 mg | ORAL_TABLET | Freq: Once | ORAL | Status: AC
  Administered 2021-11-24: 250 mg via ORAL
  Filled 2021-11-24: qty 1

## 2021-11-24 MED ORDER — ACETAMINOPHEN 160 MG/5ML PO SOLN: 650.0000 mg | ORAL | Status: DC | PRN

## 2021-11-24 MED ORDER — LORAZEPAM 2 MG/ML IJ SOLN
0.5000 mg | Freq: Once | INTRAMUSCULAR | Status: DC
  Filled 2021-11-24: qty 1

## 2021-11-24 MED ORDER — IOHEXOL 350 MG/ML SOLN
75.0000 mL | Freq: Once | INTRAVENOUS | Status: AC | PRN
  Administered 2021-11-24: 75 mL via INTRAVENOUS

## 2021-11-24 MED ORDER — STROKE: EARLY STAGES OF RECOVERY BOOK
Freq: Once | Status: AC
  Filled 2021-11-24: qty 1

## 2021-11-24 MED ORDER — ACETAMINOPHEN 325 MG PO TABS: 650.0000 mg | ORAL_TABLET | ORAL | Status: DC | PRN

## 2021-11-24 MED ORDER — HYDRALAZINE HCL 10 MG PO TABS
10.0000 mg | ORAL_TABLET | Freq: Once | ORAL | Status: AC
  Administered 2021-11-24: 10 mg via ORAL
  Filled 2021-11-24: qty 1

## 2021-11-24 MED ORDER — SODIUM CHLORIDE 0.9 % IV SOLN: INTRAVENOUS | Status: DC

## 2021-11-24 MED ORDER — ACETAMINOPHEN 650 MG RE SUPP: 650.0000 mg | RECTAL | Status: DC | PRN

## 2021-11-24 NOTE — ED Provider Notes (Signed)
[facility] DEPT Provider Note   CSN: [id number] Arrival date & time: 11/24/21  7846     History  Chief Complaint  Patient presents with   Altered Mental Status    [name] is a 88 y.o. female. With pmh mixed vascular and Alzheimer's disease dementia, left frontal CVA noted on MRI 10/27/2021, hypertension, hyperlipidemia, hyponatremia on salt tabs, Aflutter, hypothyroidism, OH, prior h/o ovarian Ca, CKD3, anemia who presents after brief episode of altered mental status which has since resolved.  Spoke with her nurse Denton Ar, CNAs were getting patient dressed this morning and were concerned she did not look right and had a "blank stare" and daze and was not answering questions around 730 this morning . Finally started speaking and had slurred speech. Walks with a walker at baseline but could not follow commands. Would not follow any commands. Event lasted approximately 15-30 minutes. Then resolved no longer slurred speech and walking by time of EMS arrived. Did have recent stroke in September no known deficits from it. No history of seizures. No recent infections. No falls recently.   Normal baseline mental status is AAOx2-3 usually to person and place.   Patient has severe dementia and is a poor historian but is denying any active complaints for now.  She denies any headache, chest pain or shortness of breath.  She continuously asked why she is here now.  And what is going on.  He spoke to her daughter who is at bedside who is says that she is now back at baseline however she did have a prolonged confused status earlier this morning.   Altered Mental Status      Home Medications Prior to Admission medications   Medication Sig Start Date End Date Taking? Authorizing Provider  acetaminophen (TYLENOL) 500 MG tablet Take 1,000 mg by mouth every 8 (eight) hours as needed.    [provider]  azelastine (ASTELIN) 0.1 % nasal spray Place 1  spray into both nostrils daily as needed for rhinitis or allergies. 12/06/15   [provider]  B Complex-C-Folic Acid (B-COMPLEX/FOLIC ACID/VITAMIN C PO) Take 1 tablet by mouth daily with breakfast.    [provider]  bimatoprost (LUMIGAN) 0.03 % ophthalmic solution Place 1 drop into both eyes at bedtime.    [provider]  calcium carbonate (TUMS EX) 750 MG chewable tablet Chew 750 mg by mouth daily as needed for heartburn.    [provider]  Cholecalciferol (VITAMIN D3) 50 MCG (2000 UT) TABS Take 2,000 Units by mouth in the morning.    [provider]  cycloSPORINE (RESTASIS) 0.05 % ophthalmic emulsion Place 1 drop into both eyes 2 (two) times daily.    [provider]  diclofenac Sodium (VOLTAREN) 1 % GEL Apply 2 g topically in the morning and at bedtime.    [provider]  docusate sodium (COLACE) 100 MG capsule Take 100 mg by mouth daily.    [provider]  hydrALAZINE (APRESOLINE) 10 MG tablet Take 10 mg by mouth 2 (two) times daily as needed.    [provider]  levothyroxine (SYNTHROID) 25 MCG tablet Take 25 mcg by mouth daily before breakfast.    [provider]  meclizine (ANTIVERT) 12.5 MG tablet Take 1 tablet (12.5 mg total) by mouth 2 (two) times daily as needed for dizziness. 04/11/20   Eulogio Bear U, DO  melatonin 3 MG TABS tablet Take 1 tablet (3 mg total) by mouth at bedtime. 04/11/20  Eulogio Bear U, DO  miconazole (MICOTIN) 2 % powder Apply topically daily. Patient may apply to dry skin under breasts daily and after showering    [provider]  mirabegron ER (MYRBETRIQ) 25 MG TB24 tablet Take 25 mg by mouth daily.    [provider]  nystatin (MYCOSTATIN/NYSTOP) powder Apply 1 application topically 2 (two) times daily as needed.    [provider]  pantoprazole (PROTONIX) 40 MG tablet Take 1 tablet (40 mg total) by mouth 2 (two) times daily. 04/24/20 02/11/48   Elodia Florence., MD  sodium chloride 1 g tablet Take 1 g by mouth 2 (two) times daily.    [provider]  zinc oxide 20 % ointment Apply 1 application topically as needed (for redness- to be applied to buttocks/peri area and after every incontinent episode).    [provider]      Allergies    Morphine and related, Morphine sulfate, Penicillins, Nsaids, Penicillin v, Shellfish allergy, and Shellfish-derived products    Review of Systems   Review of Systems  Physical Exam Updated Vital Signs BP (!) 215/97   Pulse 80   Temp 97.8 F (36.6 C) (Oral)   Resp 16   Wt 90.7 kg   SpO2 98%   BMI 35.42 kg/m  Physical Exam Constitutional: Alert and oriented to person place and time.  Pleasantly mildly confused and does not know situation Eyes: Conjunctivae are normal. ENT      Head: Normocephalic and atraumatic.      Nose: No congestion.      Mouth/Throat: Mucous membranes are moist.      Neck: No stridor. Cardiovascular: S1, S2,  Normal and symmetric distal pulses are present in all extremities.Warm and well perfused. Respiratory: Normal respiratory effort.  O2 sat 97 on RA Gastrointestinal: Soft and nontender. Musculoskeletal: Normal range of motion in all extremities. Neurologic: Normal speech and language.  AAOx3.  Pleasantly demented.  Equal strength of bilateral upper and lower extremities.  Sensation grossly intact.  No facial droop.  Pupils equal round and reactive to light.  EOMI.  Normal finger-to-nose bilaterally.  No gross focal neurologic deficits are appreciated. Skin: Skin is warm, dry and intact. No rash noted. Psychiatric: Mood and affect are normal. Speech and behavior are normal.  ED Results / Procedures / Treatments   Labs (all labs ordered are listed, but only abnormal results are displayed) Labs Reviewed  CBC WITH DIFFERENTIAL/PLATELET - Abnormal; Notable for the following components:      Result Value   RBC 5.25 (*)    All other  components within normal limits  COMPREHENSIVE METABOLIC PANEL - Abnormal; Notable for the following components:   Glucose, Bld 122 (*)    Creatinine, Ser 1.20 (*)    Total Protein 8.5 (*)    GFR, Estimated 43 (*)    All other components within normal limits  URINALYSIS, ROUTINE W REFLEX MICROSCOPIC - Abnormal; Notable for the following components:   Protein, ur 100 (*)    Bacteria, UA RARE (*)    All other components within normal limits    EKG EKG Interpretation  Date/Time:  Sunday November 24 2021 10:25:18 EDT Ventricular Rate:  79 PR Interval:  187 QRS Duration: 79 QT Interval:  397 QTC Calculation: 456 R Axis:   -25 Text Interpretation: Sinus rhythm Inferior infarct, old Consider anterior infarct Baseline wander in lead(s) V2 Confirmed by Georgina Snell [id number]) on 11/24/2021 10:29:41 AM  Radiology MR BRAIN WO CONTRAST  Result Date: 11/24/2021 CLINICAL DATA:  episode of AMS, dysarthria now resolved EXAM: MRI HEAD WITHOUT CONTRAST TECHNIQUE: Multiplanar, multiecho pulse sequences of the brain and surrounding structures were obtained without intravenous contrast. COMPARISON:  CT head from the same day. FINDINGS: Motion limited study. Brain: Punctate acute infarct in the high right posterior frontal lobe (series 5, images 34 and 28). Additional small acute cortical and subcortical infarcts in the left parietal lobe (for example series 5 image 28). No significant mass effect. Scattered T2/FLAIR hyperintensities limited, compatible chronic microvascular disease. Chronic 1 cm right parafalcine meningioma without significant mass effect. No evidence of acute hemorrhage, midline shift or hydrocephalus. Cerebral atrophy. Vascular: Major arterial flow voids are maintained skull base. Skull and upper cervical spine: Normal marrow signal. Sinuses/Orbits: No acute findings. Other: No mastoid effusions. IMPRESSION: 1. Multiple small acute infarcts in the left frontal and parietal lobes. 2.  Chronic 1 cm right parafalcine meningioma without significant mass effect. Electronically Signed   By: Margaretha Sheffield M.D.   On: 11/24/2021 14:15   CT HEAD WO CONTRAST (5MM)  Result Date: 11/24/2021 CLINICAL DATA:  Altered mental status EXAM: CT HEAD WITHOUT CONTRAST TECHNIQUE: Contiguous axial images were obtained from the base of the skull through the vertex without intravenous contrast. RADIATION DOSE REDUCTION: This exam was performed according to the departmental dose-optimization program which includes automated exposure control, adjustment of the mA and/or kV according to patient size and/or use of iterative reconstruction technique. COMPARISON:  04/06/2020 FINDINGS: Brain: No acute intracranial findings are seen in noncontrast CT brain. There are no signs of bleeding within the cranium. Cortical sulci are prominent. Small old lacunar infarcts are seen in basal ganglia. There is decreased density in periventricular and subcortical white matter. There is a dense calcification, possibly calcified meningioma measuring 14 x 11 mm in the right parafalcine location in the posterior right parietal lobe with no significant change. There is no adjacent edema or mass effect. Vascular: Unremarkable. Skull: Unremarkable. Sinuses/Orbits: There is almost complete opacification of left side of sphenoid sinus with no significant interval change. Other: None. IMPRESSION: No acute intracranial findings are seen in noncontrast CT brain. Atrophy.  Small-vessel disease. Stable calcified meningioma in right parafalcine location. Chronic sphenoid sinusitis. Electronically Signed   By: Elmer Picker M.D.   On: 11/24/2021 10:08    Procedures Procedures  Remain on constant cardiac monitoring, normal sinus rhythm.  Medications Ordered in ED Medications  hydrALAZINE (APRESOLINE) tablet 10 mg (10 mg Oral Given 11/24/21 1154)  levETIRAcetam (KEPPRA) tablet 250 mg (250 mg Oral Given 11/24/21 1246)  LORazepam  (ATIVAN) injection 0.5 mg (0.5 mg Intramuscular Given 11/24/21 1314)    ED Course/ Medical Decision Making/ A&P Clinical Course as of 11/24/21 1447  Sun Nov 24, 2021  1319 Discussed case with hospitalist Dr Marylyn Ishihara will be down to evaluate the patient for admission.  NP from neurology should be seen patient inpatient here and also right formal recommendations.  MRI is pending. [VB]  0737 MRI resulted with multiple Multiple small acute infarcts in the left frontal and parietal lobes.  Updated Dr. Curly Shores.  Patient admitted to Dr. Marylyn Ishihara. [VB]    Clinical Course User Index [VB] Elgie Congo, MD                           Medical Decision Making   Diem Dicocco Hauge is a 88 y.o. female. With pmh mixed vascular and Alzheimer's disease dementia, left frontal CVA  noted on MRI 10/27/2021, hypertension, hyperlipidemia, hyponatremia on salt tabs, Aflutter, hypothyroidism, OH, prior h/o ovarian Ca, CKD3, anemia who presents after brief episode of altered mental status which has since resolved.  On exam, patient is hypertensive but otherwise no acute distress with no localizing neurologic deficits and back at baseline.  Based on patient's history of recent stroke and stroke risk factors as well as story provided by daughter and nursing staff to take care of patient, consider TIA versus focal seizure with impaired awareness versus acute electrolyte abnormality or possible UTI.    CT head was obtained which I personally reviewed with no evidence of ICH and stable small vessel disease and meningioma known.  Reviewed her labs which showed normal sodium 135, glucose 122, slightly elevated creatinine 1.2 and normal unremarkable CBC.  Consulted Neurology Dr  Curly Shores, we discussed case and suspect either possible TIA versus focal seizure.  However, with her progressively rising blood pressures we will plan on permissive hypertension for concern for possible CVA and obtain MRI and she recommends starting patient on  Keppra 250 twice daily for possible focal seizure and could get spot EEG tomorrow morning inpatient.  Spoke with patient's daughter who would prefer for patient to be admitted for further work-up and management.  Amount and/or Complexity of Data Reviewed Radiology: ordered.  Risk Prescription drug management. Decision regarding hospitalization.    Final Clinical Impression(s) / ED Diagnoses Final diagnoses:  Altered mental status, unspecified altered mental status type  Cerebrovascular accident (CVA), unspecified mechanism ([place])    Rx / DC Orders ED Discharge Orders     None         Elgie Congo, MD 11/24/21 1447

## 2021-11-24 NOTE — ED Notes (Signed)
Notified MD of blood pressures.

## 2021-11-24 NOTE — Consult Note (Signed)
Neurology Consultation  Reason for Consult: episode of staring and slurred speech Referring Physician: Dr. Nechama Guard  CC: none  History is obtained from:family and chart  HPI: [name] is a 88 y.o. female with history of atrial flutter not on anticoagulation, dementia, HTN, HLD, stroke, hypothyroidism, hyponatremia, CKD 3 and anemia who presents after a transient episode of staring and not responding to questions followed by slurred speech.  This episode lasted about 15-30 minutes.  Patient has returned to baseline mental status, but has become quite hypertensive with SBP to the 200s while in the ED.  On review of cardiology notes it appears that the patient/family have declined anticoagulation adamantly in the past due to the patient's husband passing away from traumatic intracerebral hemorrhage after a fall on Plavix.  Daughter notes that baby aspirin was just started at the patient's last neurology visit  LKW: 0730 TNK given?: no, symptoms resolved IR Thrombectomy? No, symptoms resolved Modified Rankin Scale: 3-Moderate disability-requires help but walks WITHOUT assistance  ROS: Unable to obtain due to altered mental status.   Past Medical History:  Diagnosis Date   Anemia associated with acute blood loss 04/03/2011   Arthritis    knees    Ascites    Blood transfusion    hx of 2831    Complication of anesthesia    Sodium drops per pt    Cystitis    DIARRHEA, ANTIBIOTIC ASSOCIATED 05/31/2009   Qualifier: Diagnosis of  By: Tommy Medal MD, Cornelius     GERD (gastroesophageal reflux disease)    H/O hiatal hernia    Hyperlipidemia    Hypertension    Hyponatremia    Neutropenia with fever ([place]) 05/18/2011   OSTEOMYELITIS, CHRONIC, LOWER LEG 04/23/2009   Qualifier: Diagnosis of  By: Johnnye Sima MD, Felecia Shelling    OSTEOPOROSIS 04/23/2009   Qualifier: Diagnosis of  By: Johnnye Sima MD, Jeffrey     Ovarian cancer ([place]) 01/25/2011   Pneumonia    hx of    Postmenopausal atrophic  vaginitis    PROSTHETIC JOINT COMPLICATION 06/27/6158   Qualifier: Diagnosis of  By: Tommy Medal MD, Cornelius     Renal disorder    Decreased kidney function   Staph infection 2010   after knee replacement   Urinary frequency    Vulvitis      Family History  Problem Relation Age of Onset   Cancer Other        Bladder cancer   Heart attack Brother    Diabetes Brother      Social History:   reports that she quit smoking about 69 years ago. Her smoking use included cigarettes. She has never used smokeless tobacco. She reports that she does not currently use alcohol. She reports that she does not use drugs.  Medications No current facility-administered medications for this encounter.  Current Outpatient Medications:    acetaminophen (TYLENOL) 500 MG tablet, Take 1,000 mg by mouth every 8 (eight) hours as needed., Disp: , Rfl:    azelastine (ASTELIN) 0.1 % nasal spray, Place 1 spray into both nostrils daily as needed for rhinitis or allergies., Disp: , Rfl:    B Complex-C-Folic Acid (B-COMPLEX/FOLIC ACID/VITAMIN C PO), Take 1 tablet by mouth daily with breakfast., Disp: , Rfl:    bimatoprost (LUMIGAN) 0.03 % ophthalmic solution, Place 1 drop into both eyes at bedtime., Disp: , Rfl:    calcium carbonate (TUMS EX) 750 MG chewable tablet, Chew 750 mg by mouth daily as needed  for heartburn., Disp: , Rfl:    Cholecalciferol (VITAMIN D3) 50 MCG (2000 UT) TABS, Take 2,000 Units by mouth in the morning., Disp: , Rfl:    cycloSPORINE (RESTASIS) 0.05 % ophthalmic emulsion, Place 1 drop into both eyes 2 (two) times daily., Disp: , Rfl:    diclofenac Sodium (VOLTAREN) 1 % GEL, Apply 2 g topically in the morning and at bedtime., Disp: , Rfl:    docusate sodium (COLACE) 100 MG capsule, Take 100 mg by mouth daily., Disp: , Rfl:    hydrALAZINE (APRESOLINE) 10 MG tablet, Take 10 mg by mouth 2 (two) times daily as needed., Disp: , Rfl:    levothyroxine (SYNTHROID) 25 MCG tablet, Take 25 mcg by mouth daily  before breakfast., Disp: , Rfl:    meclizine (ANTIVERT) 12.5 MG tablet, Take 1 tablet (12.5 mg total) by mouth 2 (two) times daily as needed for dizziness., Disp: 30 tablet, Rfl: 0   melatonin 3 MG TABS tablet, Take 1 tablet (3 mg total) by mouth at bedtime., Disp: , Rfl: 0   miconazole (MICOTIN) 2 % powder, Apply topically daily. Patient may apply to dry skin under breasts daily and after showering, Disp: , Rfl:    mirabegron ER (MYRBETRIQ) 25 MG TB24 tablet, Take 25 mg by mouth daily., Disp: , Rfl:    nystatin (MYCOSTATIN/NYSTOP) powder, Apply 1 application topically 2 (two) times daily as needed., Disp: , Rfl:    pantoprazole (PROTONIX) 40 MG tablet, Take 1 tablet (40 mg total) by mouth 2 (two) times daily., Disp: 60 tablet, Rfl: 0   sodium chloride 1 g tablet, Take 1 g by mouth 2 (two) times daily., Disp: , Rfl:    zinc oxide 20 % ointment, Apply 1 application topically as needed (for redness- to be applied to buttocks/peri area and after every incontinent episode)., Disp: , Rfl:    Exam: Current vital signs: BP (!) 199/95   Pulse 78   Temp 98.4 F (36.9 C) (Oral)   Resp 19   Wt 90.7 kg   SpO2 96%   BMI 35.42 kg/m  Vital signs in last 24 hours: Temp:  [98.4 F (36.9 C)] 98.4 F (36.9 C) (10/15 0916) Pulse Rate:  [75-79] 78 (10/15 1300) Resp:  [16-19] 19 (10/15 1300) BP: (166-220)/(88-129) 199/95 (10/15 1300) SpO2:  [96 %-98 %] 96 % (10/15 1300) Weight:  [90.7 kg] 90.7 kg (10/15 0922)  GENERAL: Awake, alert, mildly anxious Psych: Affect appropriate for situation, patient is cooperative with examination Head: Normocephalic and atraumatic, without obvious abnormality EENT: Normal conjunctivae, moist mucous membranes, no OP obstruction LUNGS: Normal respiratory effort. Non-labored breathing on room air Extremities: warm, well perfused, without obvious deformity  NEURO:  Mental Status: Awake, alert, and oriented to person and place only She is not able to provide a clear and  coherent history of present illness. Speech/Language: speech is clear, speaking in short phrases.   Fluency, and comprehension intact without aphasia  No neglect is noted Cranial Nerves:  II: PERRL visual fields full.  III, IV, VI: EOMI. Lid elevation symmetric and full.  V: Sensation is intact to light touch and symmetrical to face.  VII: Face is symmetric resting and smiling. VIII: Hearing intact to voice IX, X: Phonation normal.  XI: Normal sternocleidomastoid and trapezius muscle strength XII: Tongue protrudes midline without fasciculations.   Motor: 5/5 strength to BUE, 4/5 strength to BLE Tone is normal. Bulk is normal.  Sensation: Intact to light touch bilaterally in all four extremities.  Coordination: FTN intact bilaterally. No pronator drift.   DTRs: 2+ biceps and brachioradialis, unable to elicit patellar  Gait: Deferred  NIHSS: 1a Level of Conscious.: 0 1b LOC Questions: 1 1c LOC Commands: 0 2 Best Gaze: 0 3 Visual: 0 4 Facial Palsy: 0 5a Motor Arm - left: 0 5b Motor Arm - Right: 0 6a Motor Leg - Left: 0 6b Motor Leg - Right: 0 7 Limb Ataxia: 0 8 Sensory: 0 9 Best Language: 0 10 Dysarthria: 0 11 Extinct. and Inatten.: 0 TOTAL: 1 (got month wrong, likely due to baseline dementia)   Labs I have reviewed labs in epic and the results pertinent to this consultation are:   CBC    Component Value Date/Time   WBC 8.2 11/24/2021 1023   RBC 5.25 (H) 11/24/2021 1023   HGB 13.9 11/24/2021 1023   HGB 12.2 08/20/2015 1133   HCT 44.4 11/24/2021 1023   HCT 36.1 08/20/2015 1133   PLT 237 11/24/2021 1023   PLT 184 08/20/2015 1133   MCV 84.6 11/24/2021 1023   MCV 85.1 08/20/2015 1133   MCH 26.5 11/24/2021 1023   MCHC 31.3 11/24/2021 1023   RDW 15.2 11/24/2021 1023   RDW 14.5 08/20/2015 1133   LYMPHSABS 0.9 11/24/2021 1023   LYMPHSABS 1.1 08/20/2015 1133   MONOABS 0.6 11/24/2021 1023   MONOABS 0.7 08/20/2015 1133   EOSABS 0.1 11/24/2021 1023   EOSABS 0.2  08/20/2015 1133   BASOSABS 0.1 11/24/2021 1023   BASOSABS 0.1 08/20/2015 1133       Latest Ref Rng & Units 11/24/2021   10:23 AM 09/12/2021   12:00 AM 05/27/2021   12:44 PM  BMP  Glucose 70 - 99 mg/dL 122     BUN 8 - 23 mg/dL '22  27     26      '$ Creatinine 0.44 - 1.00 mg/dL 1.20   1.2      Sodium 135 - 145 mmol/L 135  139     22      Potassium 3.5 - 5.1 mmol/L 3.9  4.4     4.3      Chloride 98 - 111 mmol/L 101  103     105      CO2 22 - 32 mmol/L '26  26     23      '$ Calcium 8.9 - 10.3 mg/dL 9.5  9.5          This result is from an external source.      Lipid Panel  No results found for: "CHOL", "TRIG", "HDL", "CHOLHDL", "VLDL", "LDLCALC", "LDLDIRECT"  No results found for: "HGBA1C"    Imaging I have reviewed the images obtained:  CT-scan of the brain: No acute abnormalities, atrophy and small vessel disease  MRI examination of the brain personally reviewed, agree with radiology there are small scattered infarcts in the left PCA distribution  1. Multiple small acute infarcts in the left frontal and parietal lobes. 2. Chronic 1 cm right parafalcine meningioma without significant mass effect.  Assessment: Left PCA territory embolic appearing strokes in the setting of known atrial fibrillation/flutter not on anticoagulation. Of note, patient's previous frequent falls were in the setting of low sodium, and daughter reports that more recently she does not fall very often.  Patient's daughter would like to discuss further with patient's son regarding possibly starting anticoagulation to prevent future strokes  Impression:TIA vs. Seizure vs. Behavioral fluctuation in patient with dementia  Recommendations:  # Left PCA territory  embolic appearing strokes in the setting of known atrial fibrillation/flutter not on anticoagulation - Stroke labs HgbA1c, fasting lipid panel, goal A1c less than 7%, goal LDL less than 70 - MRI brain completed as above  - CTA  - Frequent neuro checks -  Echocardiogram not indicated from neurological perspective, known atrial fibrillation - Continue ASA 81 mg daily, recommend further discussion with family regarding anticoagulation, discussed for 20 minutes with family today and they will discuss further with the stroke team tomorrow - Start date for potential anticoagulation per stroke team tomorrow - Risk factor modification - Telemetry monitoring; 30 day event monitor on discharge if no arrythmias captured  - Blood pressure goal   - Permissive hypertension to 220/120 due to acute stroke - PT consult, OT consult, Speech consult, unless patient is back to baseline - Additionally given patient's advancing dementia recommend palliative care consultation - Stroke team to follow  Pt seen by NP/Neuro and later by MD. Note/plan to be edited by MD as needed.  [place] , MSN, AGACNP-BC [facility] See Amion for schedule and pager information 11/24/2021 1:22 PM   Attending Neurologist's note:  I personally saw this patient, gathering history, performing a full neurologic examination, reviewing relevant labs, personally reviewing relevant imaging including head CT and MRI brain, and formulated the assessment and plan, adding the note above for completeness and clarity to accurately reflect my thoughts  Lesleigh Noe MD-PhD [facility] [phone] Available 7 AM to 7 PM, outside these hours please contact Neurologist on call listed on AMION

## 2021-11-24 NOTE — H&P (Signed)
History and Physical    Patient: [name] [id number]:[id number] DOB: [birth year] DOA: 11/24/2021 DOS: the patient was seen and examined on 11/24/2021 PCP: Virgie Dad, MD  Patient coming from: ALF/ILF  Chief Complaint:  Chief Complaint  Patient presents with   Altered Mental Status   HPI: [name] is a 88 y.o. female with medical history significant of a flutter, dementia, HTN, HLD, glaucoma, GERD, CVA. Presenting with confusion. History is from daughter. She reports that she was notified by the patient's facility that she was having an episode of staring off and slurred speech this morning when her CNA was assisting her in getting dressed.The episode lasted about 30 minutes in total. The facility became concerned and called for EMS.   Review of Systems: As mentioned in the history of present illness. All other systems reviewed and are negative. Past Medical History:  Diagnosis Date   Anemia associated with acute blood loss 04/03/2011   Arthritis    knees    Ascites    Blood transfusion    hx of 7341    Complication of anesthesia    Sodium drops per pt    Cystitis    DIARRHEA, ANTIBIOTIC ASSOCIATED 05/31/2009   Qualifier: Diagnosis of  By: Tommy Medal MD, Cornelius     GERD (gastroesophageal reflux disease)    H/O hiatal hernia    Hyperlipidemia    Hypertension    Hyponatremia    Neutropenia with fever ([place]) 05/18/2011   OSTEOMYELITIS, CHRONIC, LOWER LEG 04/23/2009   Qualifier: Diagnosis of  By: Johnnye Sima MD, Felecia Shelling    OSTEOPOROSIS 04/23/2009   Qualifier: Diagnosis of  By: Johnnye Sima MD, Jeffrey     Ovarian cancer ([place]) 01/25/2011   Pneumonia    hx of    Postmenopausal atrophic vaginitis    PROSTHETIC JOINT COMPLICATION 9/37/9024   Qualifier: Diagnosis of  By: Tommy Medal MD, Cornelius     Renal disorder    Decreased kidney function   Staph infection 2010   after knee replacement   Urinary frequency    Vulvitis    Past Surgical History:  Procedure  Laterality Date   ABDOMINAL HYSTERECTOMY  04/01/2011   Procedure: HYSTERECTOMY ABDOMINAL;  Surgeon: Alvino Chapel, MD;  Location: [facility];  Service: Gynecology;  Laterality: N/A;   APPENDECTOMY     JOINT REPLACEMENT     R knee in 2008, 5 operations on L knee   LAPAROTOMY  04/01/2011   Procedure: EXPLORATORY LAPAROTOMY;  Surgeon: Alvino Chapel, MD;  Location: [facility];  Service: Gynecology;  Laterality: N/A;   OTHER SURGICAL HISTORY     hx of C section 1966   SALPINGOOPHORECTOMY  04/01/2011   Procedure: SALPINGO OOPHERECTOMY;  Surgeon: Alvino Chapel, MD;  Location: [facility];  Service: Gynecology;  Laterality: Bilateral;   TONSILLECTOMY     Social History:  reports that she quit smoking about 69 years ago. Her smoking use included cigarettes. She has never used smokeless tobacco. She reports that she does not currently use alcohol. She reports that she does not use drugs.  Allergies  Allergen Reactions   Morphine And Related Anaphylaxis and Other (See Comments)    Given after knee replacement and code blue occurred   Morphine Sulfate Anaphylaxis   Penicillins Hives, Itching and Other (See Comments)    Syncope, also   Nsaids Other (See Comments)    Because of an abnormal kidney test result   Penicillin V Hives  Shellfish Allergy Nausea And Vomiting and Other (See Comments)    Pt has shellfish allergy only.  Has had IV contrast x 2 and did fine.   Shellfish-Derived Products Nausea And Vomiting    Family History  Problem Relation Age of Onset   Cancer Other        Bladder cancer   Heart attack Brother    Diabetes Brother     Prior to Admission medications   Medication Sig Start Date End Date Taking? Authorizing Provider  acetaminophen (TYLENOL) 500 MG tablet Take 1,000 mg by mouth every 8 (eight) hours as needed.    [provider]  azelastine (ASTELIN) 0.1 % nasal spray Place 1 spray into both nostrils daily as needed for rhinitis or allergies.  12/06/15   [provider]  B Complex-C-Folic Acid (B-COMPLEX/FOLIC ACID/VITAMIN C PO) Take 1 tablet by mouth daily with breakfast.    [provider]  bimatoprost (LUMIGAN) 0.03 % ophthalmic solution Place 1 drop into both eyes at bedtime.    [provider]  calcium carbonate (TUMS EX) 750 MG chewable tablet Chew 750 mg by mouth daily as needed for heartburn.    [provider]  Cholecalciferol (VITAMIN D3) 50 MCG (2000 UT) TABS Take 2,000 Units by mouth in the morning.    [provider]  cycloSPORINE (RESTASIS) 0.05 % ophthalmic emulsion Place 1 drop into both eyes 2 (two) times daily.    [provider]  diclofenac Sodium (VOLTAREN) 1 % GEL Apply 2 g topically in the morning and at bedtime.    [provider]  docusate sodium (COLACE) 100 MG capsule Take 100 mg by mouth daily.    [provider]  hydrALAZINE (APRESOLINE) 10 MG tablet Take 10 mg by mouth 2 (two) times daily as needed.    [provider]  levothyroxine (SYNTHROID) 25 MCG tablet Take 25 mcg by mouth daily before breakfast.    [provider]  meclizine (ANTIVERT) 12.5 MG tablet Take 1 tablet (12.5 mg total) by mouth 2 (two) times daily as needed for dizziness. 04/11/20   Eulogio Bear U, DO  melatonin 3 MG TABS tablet Take 1 tablet (3 mg total) by mouth at bedtime. 04/11/20   Geradine Girt, DO  miconazole (MICOTIN) 2 % powder Apply topically daily. Patient may apply to dry skin under breasts daily and after showering    [provider]  mirabegron ER (MYRBETRIQ) 25 MG TB24 tablet Take 25 mg by mouth daily.    [provider]  nystatin (MYCOSTATIN/NYSTOP) powder Apply 1 application topically 2 (two) times daily as needed.    [provider]  pantoprazole (PROTONIX) 40 MG tablet Take 1 tablet (40 mg total) by mouth 2 (two) times daily. 04/24/20 02/11/48  Elodia Florence., MD  sodium chloride 1 g tablet Take 1 g by  mouth 2 (two) times daily.    [provider]  zinc oxide 20 % ointment Apply 1 application topically as needed (for redness- to be applied to buttocks/peri area and after every incontinent episode).    [provider]    Physical Exam: Vitals:   11/24/21 1200 11/24/21 1230 11/24/21 1245 11/24/21 1300  BP: (!) 190/112 (!) 220/92 (!) 186/88 (!) 199/95  Pulse: 77 77 77 78  Resp: '19 18 17 19  '$ Temp:      TempSrc:      SpO2: 97% 98% 96% 96%  Weight:       General: 88  y.o. female resting in bed in NAD Eyes: PERRL, normal sclera ENMT: Nares patent w/o discharge, orophaynx clear, dentition normal, ears w/o discharge/lesions/ulcers Neck: Supple, trachea midline Cardiovascular: RRR, +S1, S2, no g/r, 2/6 SEM, equal pulses throughout Respiratory: CTABL, no w/r/r, normal WOB GI: BS+, NDNT, no masses noted, no organomegaly noted MSK: No e/c/c Neuro: A&O x 3, no focal deficits Psyc: Appropriate interaction and affect, calm/cooperative  Data Reviewed:  Results for orders placed or performed during the hospital encounter of 11/24/21 (from the past 24 hour(s))  CBC with Differential     Status: Abnormal   Collection Time: 11/24/21 10:23 AM  Result Value Ref Range   WBC 8.2 4.0 - 10.5 K/uL   RBC 5.25 (H) 3.87 - 5.11 MIL/uL   Hemoglobin 13.9 12.0 - 15.0 g/dL   HCT 44.4 36.0 - 46.0 %   MCV 84.6 80.0 - 100.0 fL   MCH 26.5 26.0 - 34.0 pg   MCHC 31.3 30.0 - 36.0 g/dL   RDW 15.2 11.5 - 15.5 %   Platelets 237 150 - 400 K/uL   nRBC 0.0 0.0 - 0.2 %   Neutrophils Relative % 80 %   Neutro Abs 6.6 1.7 - 7.7 K/uL   Lymphocytes Relative 11 %   Lymphs Abs 0.9 0.7 - 4.0 K/uL   Monocytes Relative 7 %   Monocytes Absolute 0.6 0.1 - 1.0 K/uL   Eosinophils Relative 1 %   Eosinophils Absolute 0.1 0.0 - 0.5 K/uL   Basophils Relative 1 %   Basophils Absolute 0.1 0.0 - 0.1 K/uL   Immature Granulocytes 0 %   Abs Immature Granulocytes 0.01 0.00 - 0.07 K/uL  Comprehensive metabolic panel      Status: Abnormal   Collection Time: 11/24/21 10:23 AM  Result Value Ref Range   Sodium 135 135 - 145 mmol/L   Potassium 3.9 3.5 - 5.1 mmol/L   Chloride 101 98 - 111 mmol/L   CO2 26 22 - 32 mmol/L   Glucose, Bld 122 (H) 70 - 99 mg/dL   BUN 22 8 - 23 mg/dL   Creatinine, Ser 1.20 (H) 0.44 - 1.00 mg/dL   Calcium 9.5 8.9 - 10.3 mg/dL   Total Protein 8.5 (H) 6.5 - 8.1 g/dL   Albumin 4.2 3.5 - 5.0 g/dL   AST 23 15 - 41 U/L   ALT 16 0 - 44 U/L   Alkaline Phosphatase 67 38 - 126 U/L   Total Bilirubin 0.6 0.3 - 1.2 mg/dL   GFR, Estimated 43 (L) >60 mL/min   Anion gap 8 5 - 15  Urinalysis, Routine w reflex microscopic Urine, Clean Catch     Status: Abnormal   Collection Time: 11/24/21 10:23 AM  Result Value Ref Range   Color, Urine YELLOW YELLOW   APPearance CLEAR CLEAR   Specific Gravity, Urine 1.010 1.005 - 1.030   pH 7.0 5.0 - 8.0   Glucose, UA NEGATIVE NEGATIVE mg/dL   Hgb urine dipstick NEGATIVE NEGATIVE   Bilirubin Urine NEGATIVE NEGATIVE   Ketones, ur NEGATIVE NEGATIVE mg/dL   Protein, ur 100 (A) NEGATIVE mg/dL   Nitrite NEGATIVE NEGATIVE   Leukocytes,Ua NEGATIVE NEGATIVE   RBC / HPF 0-5 0 - 5 RBC/hpf   WBC, UA 0-5 0 - 5 WBC/hpf   Bacteria, UA RARE (A) NONE SEEN   Squamous Epithelial / LPF 0-5 0 - 5   MRI Brain 1. Multiple small acute infarcts in the left frontal and parietal lobes. 2. Chronic 1 cm  right parafalcine meningioma without significant mass effect.   Assessment and Plan: Acute CVA     - admit to inpt, tele @ [facility]     - MRI as above     - neuro onboard, appreciate assistance     - etiology likely from a flutter but she is an anticoag risk     - check echo, A1c, lipids     - PT/OT/SLP     - palliative consult     - permissive HTN for now     - swallow screen  Hypothyroidism     - continue home regimen   HTN     - permissive HTN for now  GERD     - continue home regimen  Dementia     - continue home regimen  Glaucoma     - continue home  regimen  HLD     - continue home regimen  A flutter     - not on anti-coag d/t fall risks  Chronic hyponatremia     - continue salt tabs  AKI on CKD 3a     - check renal US     - watch nephrotoxins     - fluids  Advance Care Planning:   Code Status: FULL  Consults: Neurology, Palliative Care  Family Communication: w/ dtr by phone  Severity of Illness: The appropriate patient status for this patient is INPATIENT. Inpatient status is judged to be reasonable and necessary in order to provide the required intensity of service to ensure the patient's safety. The patient's presenting symptoms, physical exam findings, and initial radiographic and laboratory data in the context of their chronic comorbidities is felt to place them at high risk for further clinical deterioration. Furthermore, it is not anticipated that the patient will be medically stable for discharge from the hospital within 2 midnights of admission.   * I certify that at the point of admission it is my clinical judgment that the patient will require inpatient hospital care spanning beyond 2 midnights from the point of admission due to high intensity of service, high risk for further deterioration and high frequency of surveillance required.*  Author: Jonnie Finner, DO 11/24/2021 1:21 PM  For on call review [email or web address].

## 2021-11-24 NOTE — ED Notes (Signed)
Pt currently in MRI, will get vitals once return.

## 2021-11-24 NOTE — ED Triage Notes (Signed)
BIB EMS from [facility], with increased confusion, pt has dementia. Staff reports brief confusion that cleared on its on. She is back to ? Base line at present time.160/[id number]% CBG 140

## 2021-11-24 NOTE — Plan of Care (Addendum)
Curbside discussion with Dr. Nechama Guard.  Patient is 88 years old with a past medical history significant for mixed vascular and Alzheimer's dementia, recent left frontal stroke (MRI 10/27/2021), hypertension, hyperlipidemia, hyponatremia on salt tabs, atrial flutter not on anticoagulation secondary to falls risk, hypothyroidism, CKD 3, anemia.  Spell as documented by Dr. Nechama Guard: "Spoke with her nurse Denton Ar, CNAs were getting patient dressed this morning and were concerned she did not look right and had a "blank stare" and daze and was not answering questions around 730 this morning . Finally started speaking and had slurred speech. Walks with a walker at baseline but could not follow commands. Would not follow any commands. Event lasted approximately 15-30 minutes. Then resolved no longer slurred speech and walking by time of EMS arrived. Did have recent stroke in September no known deficits from it. No history of seizures. No recent infections. No falls recently."  She is back to baseline at this time  We discussed that the differential diagnosis of this event includes TIA versus focal seizure with impaired awareness versus dementia related to fluctuation.  We discussed that dementia patients when admitted to the hospital tend to have complications due to requiring chemical sedation for safety etc.  Stroke work-up would not change management for this patient as she has known atrial flutter and decision has been made not to pursue anticoagulation due to falls risk; she is not even on any antiplatelet agent.  MRI brain from October 27, 2021 reviewed   Overall, I think close outpatient follow-up with neurology would be appropriate for this spell.  In shared decision making with family, empiric trial of Keppra 250 mg twice daily could be considered to assess response and tolerability.  Unfortunately EEG is not available on the weekend at [place] [facility], but would be available Monday if the family's strongly  desired admission and observation and EEG  Preliminary recommendations -Close outpatient follow-up with neurology -Consider trial of Keppra 250 mg BID based on patient's renal function if desired by family (will be discussed further with the patient/family by neurology) -Neurology is available for full consultation if needed, please do not hesitate to reach out  [place] [phone] Available 7 AM to 7 PM, outside these hours please contact Neurologist on call listed on AMION    On further discussion with ED provider patient's blood pressure has become quite elevated with systolics over 295 (not yet documented in her chart).  She remains at her mental status baseline but I do think it is reasonable to get an MRI brain to exclude acute intracranial process.  We will formally see the patient in consultation

## 2021-11-24 NOTE — ED Notes (Signed)
Changed bed, and patient, and reattached patient to monitor. no complaints at this time.

## 2021-11-24 NOTE — ED Notes (Signed)
Pt has left for MRI

## 2021-11-24 NOTE — ED Provider Triage Note (Signed)
Emergency Medicine Provider Triage Evaluation Note  [name] , a 88 y.o. female  was evaluated in triage.  Pt complains of altered mental status.  History of dementia.  Pain from friends home last.  Apparently had some increased confusion which was brief and cleared on its own.  EMS thinks facility said she was back to her baseline.  Patient has no complaints.  Review of Systems  Positive: AMS Negative:   Physical Exam  BP (!) 175/98 (BP Location: Left Arm)   Pulse 79   Temp 98.4 F (36.9 C) (Oral)   Resp 16   Wt 90.7 kg   SpO2 98%   BMI 35.42 kg/m  Gen:   Awake, no distress   Resp:  Normal effort  MSK:   Moves extremities without difficulty  Other:  Pleasantly confused  Medical Decision Making  Medically screening exam initiated at 9:27 AM.  Appropriate orders placed.  [name] was informed that the remainder of the evaluation will be completed by another provider, this initial triage assessment does not replace that evaluation, and the importance of remaining in the ED until their evaluation is complete.  AMS   [name] A, PA-C 11/24/21 [id number]

## 2021-11-24 NOTE — ED Notes (Addendum)
Pt care taken, resting in bed no complaints at this time.

## 2021-11-25 ENCOUNTER — Other Ambulatory Visit: Payer: Self-pay

## 2021-11-25 DIAGNOSIS — E039 Hypothyroidism, unspecified: Secondary | ICD-10-CM | POA: Diagnosis not present

## 2021-11-25 DIAGNOSIS — N183 Chronic kidney disease, stage 3 unspecified: Secondary | ICD-10-CM

## 2021-11-25 DIAGNOSIS — F0390 Unspecified dementia without behavioral disturbance: Secondary | ICD-10-CM

## 2021-11-25 DIAGNOSIS — I639 Cerebral infarction, unspecified: Secondary | ICD-10-CM | POA: Diagnosis present

## 2021-11-25 LAB — LIPID PANEL
Cholesterol: 181 mg/dL (ref 0–200)
HDL: 37 mg/dL — ABNORMAL LOW (ref >40)
LDL Cholesterol: 127 mg/dL — ABNORMAL HIGH (ref 0–99)
Total CHOL/HDL Ratio: 4.9 RATIO
Triglycerides: 85 mg/dL (ref <150)
VLDL: 17 mg/dL (ref 0–40)

## 2021-11-25 LAB — HEMOGLOBIN A1C
Hgb A1c MFr Bld: 5.7 % — ABNORMAL HIGH (ref 4.8–5.6)
Mean Plasma Glucose: 116.89 mg/dL

## 2021-11-25 MED ORDER — LEVOTHYROXINE SODIUM 25 MCG PO TABS
25.0000 ug | ORAL_TABLET | Freq: Every day | ORAL | Status: DC
  Administered 2021-11-26 – 2021-11-27 (×2): 25 ug via ORAL
  Filled 2021-11-25: qty 1

## 2021-11-25 MED ORDER — MIRABEGRON ER 25 MG PO TB24
25.0000 mg | ORAL_TABLET | Freq: Every day | ORAL | Status: DC
  Administered 2021-11-25 – 2021-11-27 (×3): 25 mg via ORAL
  Filled 2021-11-25 (×3): qty 1

## 2021-11-25 MED ORDER — ATORVASTATIN CALCIUM 40 MG PO TABS
40.0000 mg | ORAL_TABLET | Freq: Every day | ORAL | Status: DC
  Administered 2021-11-25 – 2021-11-27 (×3): 40 mg via ORAL
  Filled 2021-11-25 (×3): qty 1

## 2021-11-25 MED ORDER — ASPIRIN 81 MG PO TBEC
81.0000 mg | DELAYED_RELEASE_TABLET | Freq: Every day | ORAL | Status: DC
  Administered 2021-11-25 – 2021-11-27 (×3): 81 mg via ORAL
  Filled 2021-11-25 (×3): qty 1

## 2021-11-25 MED ORDER — LATANOPROST 0.005 % OP SOLN
1.0000 [drp] | Freq: Every day | OPHTHALMIC | Status: DC
  Administered 2021-11-26: 1 [drp] via OPHTHALMIC
  Filled 2021-11-25 (×2): qty 2.5

## 2021-11-25 MED ORDER — CYCLOSPORINE 0.05 % OP EMUL
1.0000 [drp] | Freq: Two times a day (BID) | OPHTHALMIC | Status: DC
  Administered 2021-11-25 – 2021-11-27 (×4): 1 [drp] via OPHTHALMIC
  Filled 2021-11-25 (×5): qty 30

## 2021-11-25 MED ORDER — DOCUSATE SODIUM 100 MG PO CAPS
100.0000 mg | ORAL_CAPSULE | Freq: Every day | ORAL | Status: DC
  Administered 2021-11-25 – 2021-11-27 (×3): 100 mg via ORAL
  Filled 2021-11-25 (×3): qty 1

## 2021-11-25 MED ORDER — PANTOPRAZOLE SODIUM 40 MG PO TBEC
40.0000 mg | DELAYED_RELEASE_TABLET | Freq: Every day | ORAL | Status: DC
  Administered 2021-11-25 – 2021-11-27 (×3): 40 mg via ORAL
  Filled 2021-11-25 (×3): qty 1

## 2021-11-25 MED ORDER — SODIUM CHLORIDE 1 G PO TABS
1.0000 g | ORAL_TABLET | Freq: Two times a day (BID) | ORAL | Status: DC
  Administered 2021-11-25: 1 g via ORAL
  Filled 2021-11-25: qty 1

## 2021-11-25 NOTE — ED Notes (Signed)
Patient is more oriented up getting her up recliner. She is only disoriented to time.

## 2021-11-25 NOTE — Progress Notes (Signed)
PROGRESS NOTE    [name]  TDS:[id number] DOB: 02/26/1929 DOA: 11/24/2021 PCP: Virgie Dad, MD    Brief Narrative:  88 y/o female, resident of ALF, with history of dementia, A flutter, HTN, presented with confusion and slurred speech. Found to have acute CVA on MRI brain. Neurology consulted. She is undergoing work up. Plans to transfer to [facility] for stroke team input.    Assessment & Plan:   Principal Problem:   Acute CVA (cerebrovascular accident) ([place]) Active Problems:   HLD (hyperlipidemia)   Essential hypertension   GERD   CKD (chronic kidney disease) stage 3, GFR 30-59 ml/min (HCC)   Hyponatremia   AKI (acute kidney injury) ([place])   Hypothyroidism   Atrial flutter (HCC)   Mixed Alzheimer's and vascular dementia ([place])   Glaucoma   Acute CVA -noted to have left PCA embolic appearing strokes -history of A fib/flutter, she has not been on anticoagulation -CTA head and neck does not show any LVO. It does note high grade stenosis of right M3 and right A2 segment. No hemodynamically significant stenosis in carotids. Vertebral arteries patent -LDL 127, will start statin -A1c in process -PT/OT/SLP pending -Echo pending -family still deciding on anticoagulation -await further input from stroke team -currently on ASA '81mg'$   CKD stage 3a -creatinine 1.2, near baseline  Hypertension -home meds show just hydralazine prn -blood pressures are elevated -allowing permissive hypertension -may need to start antihypertensive meds  Hypothyroidism -continue synthroid  GERD -continue PPI  Dementia -mental status appears to be near baseline  Goals of care -palliative care consulted    DVT prophylaxis: SCD's Start: 11/24/21 2103  Code Status: full code Family Communication: no family present, updated daughter over the phone  Disposition Plan: Status is: Inpatient Remains inpatient appropriate because: further stroke work up and stroke team  input     Consultants:  Neurology Palliative care  Procedures:    Antimicrobials:      Subjective: She is confused but pleasant. Denies any complaints  Objective: Vitals:   11/25/21 0446 11/25/21 0700 11/25/21 0758 11/25/21 0800  BP:  (!) 105/53  (!) 183/85  Pulse:  72  77  Resp:  17  13  Temp: 97.8 F (36.6 C)  98.3 F (36.8 C)   TempSrc: Axillary  Oral   SpO2:  95%  97%  Weight:       No intake or output data in the 24 hours ending 11/25/21 1038 Filed Weights   11/24/21 0922  Weight: 90.7 kg    Examination:  General exam: Appears calm and comfortable  Respiratory system: Clear to auscultation. Respiratory effort normal. Cardiovascular system: S1 & S2 heard, RRR. No JVD, murmurs, rubs, gallops or clicks. No pedal edema. Gastrointestinal system: Abdomen is nondistended, soft and nontender. No organomegaly or masses felt. Normal bowel sounds heard. Central nervous system: Alert and oriented. No focal neurological deficits. Extremities: Symmetric 5 x 5 power. Skin: No rashes, lesions or ulcers Psychiatry: confused, pleasant    Data Reviewed: I have personally reviewed following labs and imaging studies  CBC: Recent Labs  Lab 11/24/21 1023  WBC 8.2  NEUTROABS 6.6  HGB 13.9  HCT 44.4  MCV 84.6  PLT 262   Basic Metabolic Panel: Recent Labs  Lab 11/24/21 1023  NA 135  K 3.9  CL 101  CO2 26  GLUCOSE 122*  BUN 22  CREATININE 1.20*  CALCIUM 9.5   GFR: Estimated Creatinine Clearance: 33.3 mL/min (A) (by C-G formula based on SCr  of 1.2 mg/dL (H)). Liver Function Tests: Recent Labs  Lab 11/24/21 1023  AST 23  ALT 16  ALKPHOS 67  BILITOT 0.6  PROT 8.5*  ALBUMIN 4.2   No results for input(s): "LIPASE", "AMYLASE" in the last 168 hours. No results for input(s): "AMMONIA" in the last 168 hours. Coagulation Profile: No results for input(s): "INR", "PROTIME" in the last 168 hours. Cardiac Enzymes: No results for input(s): "CKTOTAL", "CKMB",  "CKMBINDEX", "TROPONINI" in the last 168 hours. BNP (last 3 results) No results for input(s): "PROBNP" in the last 8760 hours. HbA1C: No results for input(s): "HGBA1C" in the last 72 hours. CBG: No results for input(s): "GLUCAP" in the last 168 hours. Lipid Profile: Recent Labs    11/25/21 0500  CHOL 181  HDL 37*  LDLCALC 127*  TRIG 85  CHOLHDL 4.9   Thyroid Function Tests: No results for input(s): "TSH", "T4TOTAL", "FREET4", "T3FREE", "THYROIDAB" in the last 72 hours. Anemia Panel: No results for input(s): "VITAMINB12", "FOLATE", "FERRITIN", "TIBC", "IRON", "RETICCTPCT" in the last 72 hours. Sepsis Labs: No results for input(s): "PROCALCITON", "LATICACIDVEN" in the last 168 hours.  No results found for this or any previous visit (from the past 240 hour(s)).       Radiology Studies: US RENAL  Result Date: 11/24/2021 CLINICAL DATA:  Acute kidney injury EXAM: RENAL / URINARY TRACT ULTRASOUND COMPLETE COMPARISON:  None Available. FINDINGS: Right Kidney: Renal measurements: 7.9 x 3.8 x 4.1 cm = volume: 65 mL. Echogenicity within normal limits. No mass or hydronephrosis visualized. Left Kidney: Renal measurements: 8.7 x 4.7 x 4.4 cm = volume: 94 mL. Echogenicity within normal limits. No mass or hydronephrosis visualized. Bladder: Appears normal for degree of bladder distention. Other: None. IMPRESSION: Normal renal ultrasound. Electronically Signed   By: Ulyses Jarred M.D.   On: 11/24/2021 22:07   CT ANGIO HEAD NECK W WO CM  Result Date: 11/24/2021 CLINICAL DATA:  Stroke/TIA, determine embolic source.  Confusion. EXAM: CT ANGIOGRAPHY HEAD AND NECK TECHNIQUE: Multidetector CT imaging of the head and neck was performed using the standard protocol during bolus administration of intravenous contrast. Multiplanar CT image reconstructions and MIPs were obtained to evaluate the vascular anatomy. Carotid stenosis measurements (when applicable) are obtained utilizing NASCET criteria, using  the distal internal carotid diameter as the denominator. RADIATION DOSE REDUCTION: This exam was performed according to the departmental dose-optimization program which includes automated exposure control, adjustment of the mA and/or kV according to patient size and/or use of iterative reconstruction technique. CONTRAST:  64m OMNIPAQUE IOHEXOL 350 MG/ML SOLN COMPARISON:  Noncontrast head CT and brain MRI obtained earlier the same day FINDINGS: CTA NECK FINDINGS Aortic arch: There is mild calcified plaque in the imaged aortic arch. The origins of the major branch vessels are patent. The subclavian arteries are patent to the level imaged. Right carotid system: The right common carotid artery is patent. There is calcified plaque of the bifurcation resulting in less than 50% stenosis. Distal internal carotid artery is widely patent. The external carotid artery is patent. There is no dissection or aneurysm. Left carotid system: Left common, internal, and external carotid arteries are patent, with mild plaque at the bifurcation but no hemodynamically significant stenosis or occlusion. There is no dissection or aneurysm. Vertebral arteries: The vertebral arteries are patent, without hemodynamically significant stenosis or occlusion. There is no dissection or aneurysm. Skeleton: There is mild for age degenerative change of the cervical spine. There is no acute osseous abnormality or suspicious osseous lesion. There is  no visible canal hematoma. Other neck: The soft tissues of the neck are unremarkable. Upper chest: The imaged lung apices are clear. Review of the MIP images confirms the above findings CTA HEAD FINDINGS Anterior circulation: There is calcified plaque in the intracranial ICAs without greater than mild stenosis on either side. The bilateral MCAs are patent. There is focal severe stenosis of a right M3 segment (8-134, 11-29). There is no other high-grade stenosis or occlusion. The right A1 segment is  hypoplastic/absent, likely congenital. The left A1 segment is patent. Anterior communicating artery is normal. There is short-segment severe stenosis of the right A2 segment (7-289, 8-95). There is distal reconstitution after the stenosis. There is no high-grade stenosis or occlusion on the left. There is no aneurysm or AVM Posterior circulation: The bilateral V4 segments are patent with mild calcified plaque on the right. The basilar artery is patent. The major cerebellar arteries appear patent. The bilateral PCAs are patent, without proximal stenosis or occlusion. There is atherosclerotic irregularity of the distal branches. There is a fetal origin of the right PCA. There is no aneurysm or AVM. Venous sinuses: As permitted by contrast timing, patent. Anatomic variants: As above. Review of the MIP images confirms the above findings IMPRESSION: 1. No emergent large vessel occlusion. 2. Intracranial atherosclerotic disease resulting in focal high-grade stenosis of a right M3 segment and right A2 segment. Mild atherosclerotic irregularity elsewhere. 3. Calcified plaque at the carotid bifurcations, right worse than left, without hemodynamically significant stenosis or occlusion. Patent vertebral arteries in the neck. Electronically Signed   By: Valetta Mole M.D.   On: 11/24/2021 16:01   MR BRAIN WO CONTRAST  Result Date: 11/24/2021 CLINICAL DATA:  episode of AMS, dysarthria now resolved EXAM: MRI HEAD WITHOUT CONTRAST TECHNIQUE: Multiplanar, multiecho pulse sequences of the brain and surrounding structures were obtained without intravenous contrast. COMPARISON:  CT head from the same day. FINDINGS: Motion limited study. Brain: Punctate acute infarct in the high right posterior frontal lobe (series 5, images 34 and 28). Additional small acute cortical and subcortical infarcts in the left parietal lobe (for example series 5 image 28). No significant mass effect. Scattered T2/FLAIR hyperintensities limited,  compatible chronic microvascular disease. Chronic 1 cm right parafalcine meningioma without significant mass effect. No evidence of acute hemorrhage, midline shift or hydrocephalus. Cerebral atrophy. Vascular: Major arterial flow voids are maintained skull base. Skull and upper cervical spine: Normal marrow signal. Sinuses/Orbits: No acute findings. Other: No mastoid effusions. IMPRESSION: 1. Multiple small acute infarcts in the left frontal and parietal lobes. 2. Chronic 1 cm right parafalcine meningioma without significant mass effect. Electronically Signed   By: Margaretha Sheffield M.D.   On: 11/24/2021 14:15   CT HEAD WO CONTRAST (5MM)  Result Date: 11/24/2021 CLINICAL DATA:  Altered mental status EXAM: CT HEAD WITHOUT CONTRAST TECHNIQUE: Contiguous axial images were obtained from the base of the skull through the vertex without intravenous contrast. RADIATION DOSE REDUCTION: This exam was performed according to the departmental dose-optimization program which includes automated exposure control, adjustment of the mA and/or kV according to patient size and/or use of iterative reconstruction technique. COMPARISON:  04/06/2020 FINDINGS: Brain: No acute intracranial findings are seen in noncontrast CT brain. There are no signs of bleeding within the cranium. Cortical sulci are prominent. Small old lacunar infarcts are seen in basal ganglia. There is decreased density in periventricular and subcortical white matter. There is a dense calcification, possibly calcified meningioma measuring 14 x 11 mm in the right parafalcine  location in the posterior right parietal lobe with no significant change. There is no adjacent edema or mass effect. Vascular: Unremarkable. Skull: Unremarkable. Sinuses/Orbits: There is almost complete opacification of left side of sphenoid sinus with no significant interval change. Other: None. IMPRESSION: No acute intracranial findings are seen in noncontrast CT brain. Atrophy.  Small-vessel  disease. Stable calcified meningioma in right parafalcine location. Chronic sphenoid sinusitis. Electronically Signed   By: Elmer Picker M.D.   On: 11/24/2021 10:08        Scheduled Meds:   stroke: early stages of recovery book   Does not apply Once    stroke: early stages of recovery book   Does not apply Once   aspirin EC  81 mg Oral Daily   cycloSPORINE  1 drop Both Eyes BID   docusate sodium  100 mg Oral Daily   latanoprost  1 drop Both Eyes QHS   [START ON 11/26/2021] levothyroxine  25 mcg Oral QAC breakfast   mirabegron ER  25 mg Oral Daily   pantoprazole  40 mg Oral Daily   sodium chloride  1 g Oral BID   Continuous Infusions:  sodium chloride 50 mL/hr at 11/25/21 0916     LOS: 1 day    Time spent: 19mns    JKathie Dike MD [facility]   If 7PM-7AM, please contact night-coverage [email or web address]  11/25/2021, 10:38 AM

## 2021-11-25 NOTE — ED Notes (Signed)
Family updated as to patient's status.

## 2021-11-25 NOTE — Progress Notes (Addendum)
Subjective: She appears to be improving  Exam: Vitals:   11/25/21 1630 11/25/21 1930  BP: (!) 153/80 (!) 150/117  Pulse: 80 82  Resp: 16 (!) 21  Temp: 98 F (36.7 C)   SpO2: 95% 97%   Gen: In bed, NAD Resp: non-labored breathing, no acute distress Abd: soft, nt  Neuro: MS: Awake, gives month as November, gives her age is 89 She has some difficulty with word finding, able to name watch but not watchband, sounds slightly dysarthric at times.  CN: Visual fields full, EOMI Motor: 5/5 throughout Sensory: Intact to light touch    Pertinent Labs: LDL 127, statin started  Impression: 88 year old female with history of atrial fibrillation with embolic appearing stroke.  She had another stroke about a month ago, and given this coupled with her history of atrial fibrillation, makes me suspect that she is at high likelihood of having another embolic event without anticoagulation.  Though she has had some falls, my suspicion at this point is that she would benefit from anticoagulation even though there is some risk.  I discussed this with the family who agree with starting it, with the size of her strokes being so small, I think it would be reasonable to restart anticoagulation 5 days from the infarct.  Recommendations: 1) continue ASA for now, start Eliquis '5mg'$  BID on 10/20 2) Agree with statin therapy 3) Pt,OT,ST 4) No need for monitor, given afib is known.  5) Neurology will be available as needed.   Roland Rack, MD [facility] [phone]  If 7pm- 7am, please page neurology on call as listed in [place].

## 2021-11-25 NOTE — Evaluation (Signed)
Physical Therapy Evaluation Patient Details Name: [name] MRN: [id number] DOB: 05/29/1929 Today's Date: 11/25/2021  History of Present Illness  88 y.o. female with history of atrial flutter not on anticoagulation, dementia, HTN, HLD, stroke, hypothyroidism, hyponatremia, CKD 3 and anemia who presents after a transient episode of staring and not responding to questions followed by slurred speech.  This episode lasted about 15-30 minutes.  Patient has returned to baseline mental status, but has become quite hypertensive with SBP to the 200s while in the ED. Dx of multiple small infarcts L frontal and parietal lobes.  Clinical Impression  Pt admitted with above diagnosis from [facility], where pt reports she ambulated without an assistive device and received assistance for bathing/dressing. Pt is pleasant and cooperative, RN reported it took assist of 2 for pt to transfer bed to recliner prior to PT session. Pt was able to perform sit to stand with mod assist, and then able to march in place with RW x 30 reps to simulate ambulation (IV was attached to bed frame, so ambulation wasn't possible). Assistance for mobility recommended upon acute DC. Pt did have a few episodes of word finding difficulty, but was able to follow commands and is oriented to self and location.  Pt currently with functional limitations due to the deficits listed below (see PT Problem List). Pt will benefit from skilled PT to increase their independence and safety with mobility to allow discharge to the venue listed below.          Recommendations for follow up therapy are one component of a multi-disciplinary discharge planning process, led by the attending physician.  Recommendations may be updated based on patient status, additional functional criteria and insurance authorization.  Follow Up Recommendations Skilled nursing-short term rehab (<3 hours/day) Can patient physically be transported by private vehicle:  No    Assistance Recommended at Discharge Intermittent Supervision/Assistance  Patient can return home with the following  A lot of help with walking and/or transfers;A lot of help with bathing/dressing/bathroom;Assistance with cooking/housework;Assist for transportation;Help with stairs or ramp for entrance;Direct supervision/assist for financial management;Direct supervision/assist for medications management    Equipment Recommendations None recommended by PT  Recommendations for Other Services       Functional Status Assessment Patient has had a recent decline in their functional status and demonstrates the ability to make significant improvements in function in a reasonable and predictable amount of time.     Precautions / Restrictions Precautions Precautions: Fall Restrictions Weight Bearing Restrictions: No      Mobility  Bed Mobility               General bed mobility comments: up in recliner    Transfers Overall transfer level: Needs assistance Equipment used: Rolling walker (2 wheels) Transfers: Sit to/from Stand Sit to Stand: Min assist           General transfer comment: VCs hand placement    Ambulation/Gait     Assistive device: Rolling walker (2 wheels)         General Gait Details: unable to ambulate 2* IV connected to bed frame in ED, simulated walking with marching in place at edge of recliner with RW. Pt able to march in place x 30 reps without loss of balance, no loss of balance.  Stairs            Wheelchair Mobility    Modified Rankin (Stroke Patients Only) Modified Rankin (Stroke Patients Only) Pre-Morbid Rankin Score: Slight disability Modified  Rankin: Moderately severe disability     Balance Overall balance assessment: Needs assistance Sitting-balance support: Feet supported, No upper extremity supported Sitting balance-Leahy Scale: Good     Standing balance support: No upper extremity supported Standing  balance-Leahy Scale: Fair Standing balance comment: able to stand statically without loss of balance, relied on BUE support for marching in place                             Pertinent Vitals/Pain Pain Assessment Pain Assessment: No/denies pain    Home Living Family/patient expects to be discharged to:: Assisted living                   Additional Comments: pt reports she lives at [facility], pt denied having any DME    Prior Function Prior Level of Function : Needs assist             Mobility Comments: reports she walks to dining room without an AD ADLs Comments: assist for bathing/dressing     Hand Dominance        Extremity/Trunk Assessment   Upper Extremity Assessment Upper Extremity Assessment: Defer to OT evaluation    Lower Extremity Assessment Lower Extremity Assessment: Overall WFL for tasks assessed    Cervical / Trunk Assessment Cervical / Trunk Assessment: Normal  Communication   Communication: HOH  Cognition Arousal/Alertness: Awake/alert Behavior During Therapy: WFL for tasks assessed/performed Overall Cognitive Status: No family/caregiver present to determine baseline cognitive functioning                                 General Comments: pleasant, oriented to self and location, some word finding difficulty, able to follow one step commands.        General Comments      Exercises     Assessment/Plan    PT Assessment Patient needs continued PT services  PT Problem List Decreased activity tolerance;Decreased balance;Decreased mobility       PT Treatment Interventions Therapeutic exercise;Therapeutic activities;Gait training;Patient/family education;Functional mobility training;Balance training    PT Goals (Current goals can be found in the Care Plan section)  Acute Rehab PT Goals Patient Stated Goal: to get stronger PT Goal Formulation: With patient Time For Goal Achievement: 12/09/21 Potential  to Achieve Goals: Good    Frequency Min 3X/week     Co-evaluation               AM-PAC PT "6 Clicks" Mobility  Outcome Measure Help needed turning from your back to your side while in a flat bed without using bedrails?: A Little Help needed moving from lying on your back to sitting on the side of a flat bed without using bedrails?: A Lot Help needed moving to and from a bed to a chair (including a wheelchair)?: A Little Help needed standing up from a chair using your arms (e.g., wheelchair or bedside chair)?: A Little Help needed to walk in hospital room?: A Lot Help needed climbing 3-5 steps with a railing? : Total 6 Click Score: 14    End of Session Equipment Utilized During Treatment: Gait belt Activity Tolerance: Patient tolerated treatment well;No increased pain Patient left: in chair;with call bell/phone within reach Nurse Communication: Mobility status PT Visit Diagnosis: Difficulty in walking, not elsewhere classified (R26.2)    Time: 9629-5284 PT Time Calculation (min) (ACUTE ONLY): 14 min   Charges:  PT Evaluation $PT Eval Moderate Complexity: 1 Mod         Philomena Doheny PT 11/25/2021  Acute Rehabilitation Services  Office [phone]

## 2021-11-25 NOTE — ED Notes (Addendum)
Pts daughter left to go home, pt is resting with even chest rise and fall

## 2021-11-25 NOTE — ED Notes (Signed)
Pt woke up and pulled out both iv's, pulled off all wires, took off her purewick, and tried to get out of bed. Cleaned up the bed put her back in it and restarted the iv.

## 2021-11-26 DIAGNOSIS — I639 Cerebral infarction, unspecified: Secondary | ICD-10-CM | POA: Diagnosis not present

## 2021-11-26 LAB — CBC
HCT: 40.1 % (ref 36.0–46.0)
Hemoglobin: 12.7 g/dL (ref 12.0–15.0)
MCH: 26.7 pg (ref 26.0–34.0)
MCHC: 31.7 g/dL (ref 30.0–36.0)
MCV: 84.2 fL (ref 80.0–100.0)
Platelets: 202 10*3/uL (ref 150–400)
RBC: 4.76 MIL/uL (ref 3.87–5.11)
RDW: 15 % (ref 11.5–15.5)
WBC: 8.9 10*3/uL (ref 4.0–10.5)
nRBC: 0 % (ref 0.0–0.2)

## 2021-11-26 LAB — BASIC METABOLIC PANEL
Anion gap: 8 (ref 5–15)
BUN: 29 mg/dL — ABNORMAL HIGH (ref 8–23)
CO2: 23 mmol/L (ref 22–32)
Calcium: 8.7 mg/dL — ABNORMAL LOW (ref 8.9–10.3)
Chloride: 104 mmol/L (ref 98–111)
Creatinine, Ser: 1.23 mg/dL — ABNORMAL HIGH (ref 0.44–1.00)
GFR, Estimated: 42 mL/min — ABNORMAL LOW (ref >60)
Glucose, Bld: 109 mg/dL — ABNORMAL HIGH (ref 70–99)
Potassium: 3.7 mmol/L (ref 3.5–5.1)
Sodium: 135 mmol/L (ref 135–145)

## 2021-11-26 MED ORDER — HYDRALAZINE HCL 25 MG PO TABS: 25.0000 mg | ORAL_TABLET | Freq: Four times a day (QID) | ORAL | Status: DC | PRN

## 2021-11-26 NOTE — Plan of Care (Signed)
  Problem: Clinical Measurements: Goal: Ability to maintain clinical measurements within normal limits will improve Outcome: Progressing Goal: Diagnostic test results will improve Outcome: Progressing Goal: Cardiovascular complication will be avoided Outcome: Progressing   Problem: Safety: Goal: Ability to remain free from injury will improve Outcome: Progressing

## 2021-11-26 NOTE — Progress Notes (Signed)
   11/26/21 0214  Assess: MEWS Score  Temp 97.8 F (36.6 C)  BP (!) 222/106  Pulse Rate 81  Resp 18  Level of Consciousness Alert  SpO2 100 %  O2 Device Room Air  Assess: if the MEWS score is Yellow or Red  Were vital signs taken at a resting state? Yes  Focused Assessment No change from prior assessment  Does the patient meet 2 or more of the SIRS criteria? No  MEWS guidelines implemented *See Row Information* Yes  Treat  Pain Scale 0-10  Pain Score 0  Take Vital Signs  Increase Vital Sign Frequency  Yellow: Q 2hr X 2 then Q 4hr X 2, if remains yellow, continue Q 4hrs  Escalate  MEWS: Escalate Yellow: discuss with charge nurse/RN and consider discussing with provider and RRT  Notify: Charge Nurse/RN  Name of Charge Nurse/RN Notified Tomma Rakers, RN  Date Charge Nurse/RN Notified 11/26/21  Time Charge Nurse/RN Notified 0239  Notify: Provider  Provider Name/Title J.Daniels,NP  Date Provider Notified 11/26/21  Time Provider Notified [phone]  Method of Notification  (secure chat)  Notification Reason Change in status (Yellow mews)  Provider response No new orders  Date of Provider Response 11/26/21  Assess: SIRS CRITERIA  SIRS Temperature  0  SIRS Pulse 0  SIRS Respirations  0  SIRS WBC 1  SIRS Score Sum  1

## 2021-11-26 NOTE — Evaluation (Signed)
SLP Cancellation Note  Patient Details Name: [name] MRN: [id number] DOB: October 14, 1929   Cancelled treatment:       Reason Eval/Treat Not Completed: Other (comment) (pt working with staff at this time, will continue efforts)  Note has h/o dementia - per chart review.   Kathleen Lime, MS [facility] SLP Acute Rehab Services Office [phone] Pager [phone]    Macario Golds 11/26/2021, 5:45 PM

## 2021-11-26 NOTE — Progress Notes (Addendum)
PROGRESS NOTE    [name]  WIO:[id number] DOB: [birth year] DOA: 11/24/2021 PCP: Virgie Dad, MD    Brief Narrative:  88 y/o female, resident of ALF, with history of dementia, A flutter, HTN, presented with confusion and slurred speech. Found to have acute CVA on MRI brain. Neurology consulted. She underwent stroke work-up.  Plans are to start anticoagulation on 10/20.  She was started on statin for elevated LDL.  She is awaiting transfer to skilled nursing facility level of care.   Assessment & Plan:   Principal Problem:   Acute CVA (cerebrovascular accident) ([place]) Active Problems:   HLD (hyperlipidemia)   Essential hypertension   GERD   CKD (chronic kidney disease) stage 3, GFR 30-59 ml/min (HCC)   Hyponatremia   AKI (acute kidney injury) ([place])   Hypothyroidism   Atrial flutter (HCC)   Mixed Alzheimer's and vascular dementia ([place])   Glaucoma   Stroke (cerebrum) ([place])   Acute CVA -noted to have left PCA embolic appearing strokes -history of A fib/flutter, she has not been on anticoagulation -CTA head and neck does not show any LVO. It does note high grade stenosis of right M3 and right A2 segment. No hemodynamically significant stenosis in carotids. Vertebral arteries patent -LDL 127, started atorvastatin -A1c 5.7 -PT/OT/SLP recommendations for skilled nursing facility level care -Per neuro, no need for echocardiogram due to known atrial fibrillation and need for anticoagulation -currently on ASA '81mg'$  -Per neuro, start Eliquis 5 mg twice daily on 10/20, family agreeable -Follow-up neurology outpatient  CKD stage 3a -creatinine 1.2, near baseline  Hypertension -home meds show just hydralazine prn -Discussed with patient's daughter, she reports that as needed hydralazine has worked well for the patient prior to admission -She reports that patient's blood pressure is labile at times and would be concerned regarding starting a daily medication -We will continue  on as needed hydralazine for now and monitor blood pressure trends  Hypothyroidism -continue synthroid  GERD -continue PPI  Dementia -mental status appears to be near baseline  Obesity, class I -BMI 34.4  Goals of care -palliative care consulted -Plans are to continue full measures/full code    DVT prophylaxis: SCD's Start: 11/24/21 2103  Code Status: full code Family Communication: no family present, updated daughter over the phone  Disposition Plan: Status is: Inpatient Remains inpatient appropriate because: Needs placement to skilled nursing facility     Consultants:  Neurology Palliative care  Procedures:    Antimicrobials:      Subjective: She is sitting up in the chair.  She denies any complaints.  She is aware she is in the hospital and she has had a stroke.  Objective: Vitals:   11/26/21 1014 11/26/21 1700 11/26/21 1731 11/26/21 1738  BP: (!) 147/78 (!) 166/98 (!) 182/85 (!) 149/86  Pulse: 87  81   Resp: 18     Temp: 98.7 F (37.1 C)     TempSrc: Oral     SpO2: 97%  98%   Weight:      Height:        Intake/Output Summary (Last 24 hours) at 11/26/2021 2044 Last data filed at 11/26/2021 1902 Gross per 24 hour  Intake 1840.99 ml  Output 912 ml  Net 928.99 ml   Filed Weights   11/24/21 0922 11/26/21 0214  Weight: 90.7 kg 88.1 kg    Examination:  General exam: Appears calm and comfortable  Respiratory system: Clear to auscultation. Respiratory effort normal. Cardiovascular system: S1 & S2 heard,  RRR. No JVD, murmurs, rubs, gallops or clicks. No pedal edema. Gastrointestinal system: Abdomen is nondistended, soft and nontender. No organomegaly or masses felt. Normal bowel sounds heard. Central nervous system: Alert and oriented. No focal neurological deficits. Extremities: Symmetric 5 x 5 power. Skin: No rashes, lesions or ulcers Psychiatry: confused, pleasant    Data Reviewed: I have personally reviewed following labs and imaging  studies  CBC: Recent Labs  Lab 11/24/21 1023 11/26/21 0420  WBC 8.2 8.9  NEUTROABS 6.6  --   HGB 13.9 12.7  HCT 44.4 40.1  MCV 84.6 84.2  PLT 237 694   Basic Metabolic Panel: Recent Labs  Lab 11/24/21 1023 11/26/21 0420  NA 135 135  K 3.9 3.7  CL 101 104  CO2 26 23  GLUCOSE 122* 109*  BUN 22 29*  CREATININE 1.20* 1.23*  CALCIUM 9.5 8.7*   GFR: Estimated Creatinine Clearance: 32 mL/min (A) (by C-G formula based on SCr of 1.23 mg/dL (H)). Liver Function Tests: Recent Labs  Lab 11/24/21 1023  AST 23  ALT 16  ALKPHOS 67  BILITOT 0.6  PROT 8.5*  ALBUMIN 4.2   No results for input(s): "LIPASE", "AMYLASE" in the last 168 hours. No results for input(s): "AMMONIA" in the last 168 hours. Coagulation Profile: No results for input(s): "INR", "PROTIME" in the last 168 hours. Cardiac Enzymes: No results for input(s): "CKTOTAL", "CKMB", "CKMBINDEX", "TROPONINI" in the last 168 hours. BNP (last 3 results) No results for input(s): "PROBNP" in the last 8760 hours. HbA1C: Recent Labs    11/24/21 2130  HGBA1C 5.7*   CBG: No results for input(s): "GLUCAP" in the last 168 hours. Lipid Profile: Recent Labs    11/25/21 0500  CHOL 181  HDL 37*  LDLCALC 127*  TRIG 85  CHOLHDL 4.9   Thyroid Function Tests: No results for input(s): "TSH", "T4TOTAL", "FREET4", "T3FREE", "THYROIDAB" in the last 72 hours. Anemia Panel: No results for input(s): "VITAMINB12", "FOLATE", "FERRITIN", "TIBC", "IRON", "RETICCTPCT" in the last 72 hours. Sepsis Labs: No results for input(s): "PROCALCITON", "LATICACIDVEN" in the last 168 hours.  No results found for this or any previous visit (from the past 240 hour(s)).       Radiology Studies: US RENAL  Result Date: 11/24/2021 CLINICAL DATA:  Acute kidney injury EXAM: RENAL / URINARY TRACT ULTRASOUND COMPLETE COMPARISON:  None Available. FINDINGS: Right Kidney: Renal measurements: 7.9 x 3.8 x 4.1 cm = volume: 65 mL. Echogenicity within  normal limits. No mass or hydronephrosis visualized. Left Kidney: Renal measurements: 8.7 x 4.7 x 4.4 cm = volume: 94 mL. Echogenicity within normal limits. No mass or hydronephrosis visualized. Bladder: Appears normal for degree of bladder distention. Other: None. IMPRESSION: Normal renal ultrasound. Electronically Signed   By: Ulyses Jarred M.D.   On: 11/24/2021 22:07        Scheduled Meds:  aspirin EC  81 mg Oral Daily   atorvastatin  40 mg Oral Daily   cycloSPORINE  1 drop Both Eyes BID   docusate sodium  100 mg Oral Daily   latanoprost  1 drop Both Eyes QHS   levothyroxine  25 mcg Oral QAC breakfast   mirabegron ER  25 mg Oral Daily   pantoprazole  40 mg Oral Daily   Continuous Infusions:     LOS: 2 days    Time spent: 63mns    JKathie Dike MD [facility]   If 7PM-7AM, please contact night-coverage [email or web address]  11/26/2021, 8:44 PM

## 2021-11-26 NOTE — Plan of Care (Signed)
  Problem: Clinical Measurements: Goal: Ability to maintain clinical measurements within normal limits will improve Outcome: Progressing Goal: Diagnostic test results will improve Outcome: Progressing Goal: Cardiovascular complication will be avoided Outcome: Progressing   Problem: Safety: Goal: Ability to remain free from injury will improve Outcome: Progressing   Problem: Education: Goal: Knowledge of disease or condition will improve Outcome: Progressing Goal: Knowledge of secondary prevention will improve (SELECT ALL) Outcome: Progressing Goal: Knowledge of patient specific risk factors will improve (INDIVIDUALIZE FOR PATIENT) Outcome: Progressing   Problem: Nutrition: Goal: Risk of aspiration will decrease Outcome: Progressing

## 2021-11-26 NOTE — TOC Initial Note (Signed)
Transition of Care [facility]) - Initial/Assessment Note    Patient Details  Name: [name] MRN: [id number] Date of Birth: [birth year]  [facility]) CM/SW Contact:    Roseanne Kaufman, RN Phone Number: 11/26/2021, 2:00 PM  Clinical Narrative:     Received TOC consult for SNF, PT recommended short term SNF. Spoke with patient's daughter who reports patient resides in [place]. Patient's daughter would like for her mother to move to [place] due to able to accommodate short term rehab. Patient's daughter questioning insurance coverage, this RNCM explained to patient's daughter due to her insurance will need 3 midnights as inpatient to have Medicare assist with payment.  TOC will continue to follow.               Expected Discharge Plan: Skilled Nursing Facility Barriers to Discharge: Continued Medical Work up   Patient Goals and CMS Choice Patient states their goals for this hospitalization and ongoing recovery are:: short term rehab CMS Medicare.gov Compare Post Acute Care list provided to:: Patient Represenative (must comment) Rossanna Spitzley (dtr) [phone]) Choice offered to / list presented to : Adult Children  Expected Discharge Plan and Services Expected Discharge Plan: [place] In-house Referral: NA Discharge Planning Services: CM Consult Post Acute Care Choice: NA Living arrangements for the past 2 months: [place] ([facility])                 DME Arranged: N/A DME Agency: NA       HH Arranged: NA HH Agency: NA        Prior Living Arrangements/Services Living arrangements for the past 2 months: [place] ([place]) Lives with:: Facility Resident Patient language and need for interpreter reviewed:: Yes Do you feel safe going back to the place where you live?: Yes      Need for Family Participation in Patient Care: Yes (Comment) Care giver support system in  place?: Yes (comment) Current home services: Other (comment) (n/a) Criminal Activity/Legal Involvement Pertinent to Current Situation/Hospitalization: No - Comment as needed  Activities of Daily Living   ADL Screening (condition at time of admission) Patient's cognitive ability adequate to safely complete daily activities?: No Is the patient deaf or have difficulty hearing?: No Does the patient have difficulty seeing, even when wearing glasses/contacts?: No Does the patient have difficulty concentrating, remembering, or making decisions?: Yes Patient able to express need for assistance with ADLs?: No Does the patient have difficulty dressing or bathing?: Yes Independently performs ADLs?: No Communication: Independent Dressing (OT): Needs assistance Is this a change from baseline?: Pre-admission baseline Grooming: Needs assistance Is this a change from baseline?: Pre-admission baseline Feeding: Independent Bathing: Needs assistance Is this a change from baseline?: Pre-admission baseline Toileting: Needs assistance Is this a change from baseline?: Pre-admission baseline In/Out Bed: Needs assistance Is this a change from baseline?: Pre-admission baseline Walks in Home: Needs assistance Is this a change from baseline?: Pre-admission baseline Does the patient have difficulty walking or climbing stairs?: Yes Weakness of Legs: Both Weakness of Arms/Hands: Both  Permission Sought/Granted Permission sought to share information with : Case Manager Permission granted to share information with : Yes, Verbal Permission Granted  Share Information with NAME: Case manager           Emotional Assessment Appearance:: Appears stated age Attitude/Demeanor/Rapport: Unable to Assess Affect (typically observed): Unable to Assess Orientation: : Oriented to Self, Oriented to Place Alcohol / Substance Use:  Not Applicable Psych Involvement: No (comment)  Admission diagnosis:  TIA (transient  ischemic attack) [G45.9] Stroke (cerebrum) (HCC) [I63.9] Acute CVA (cerebrovascular accident) ([place]) [I63.9] Altered mental status, unspecified altered mental status type [R41.82] Cerebrovascular accident (CVA), unspecified mechanism ([place]) [I63.9] Patient Active Problem List   Diagnosis Date Noted   Stroke (cerebrum) ([place]) 11/25/2021   TIA (transient ischemic attack) 11/24/2021   Acute CVA (cerebrovascular accident) ([place]) 11/24/2021   Glaucoma 11/24/2021   Mixed Alzheimer's and vascular dementia ([place]) 11/07/2021   Candidiasis of female genitalia 03/05/2021   Hypothyroidism 03/05/2021   Atrial flutter ([place]) 03/05/2021   Cognitive impairment 03/05/2021   Increased urinary frequency 07/13/2020   COVID-19 virus RNA test result positive at limit of detection 06/27/2020   Syncope 04/16/2020   AKI (acute kidney injury) ([place]) 04/16/2020   Orthostatic hypotension 04/07/2020   Syncope and collapse 04/06/2020   Fall at home, initial encounter 04/06/2020   Acute right ankle pain 04/06/2020   Acute lower UTI 10/07/2019   Diarrhea 10/07/2019   Acute cystitis without hematuria    Muscle spasm of right lower extremity 08/05/2019   Hyponatremia 08/05/2019   Insomnia 08/05/2019   Slow transit constipation 08/05/2019   Atherosclerosis of abdominal aorta ([place]) 10/02/2015   Obesity (BMI 30-39.9) 10/31/2014   CKD (chronic kidney disease) stage 3, GFR 30-59 ml/min (HCC) 10/31/2014   Ventral incisional hernia 10/31/2014   Primary peritoneal carcinomatosis ([place]) 10/31/2014   Incisional hernia, periumbilical, without obstruction or gangrene 11/03/2012   Anemia associated with acute blood loss 04/03/2011   Ovarian cancer ([place]) 01/25/2011   HLD (hyperlipidemia) 04/23/2009   Essential hypertension 04/23/2009   GERD 04/23/2009   Osteoarthritis 04/23/2009   Osteoporosis 04/23/2009   PCP:  Virgie Dad, MD Pharmacy:   [place], [place] [address] [place] [address] Phone: [phone] Fax: [phone]     Social Determinants of Health (SDOH) Interventions    Readmission Risk Interventions     No data to display

## 2021-11-26 NOTE — Evaluation (Signed)
Occupational Therapy Evaluation Patient Details Name: [name] MRN: [id number] DOB: 1929-12-08 Today's Date: 11/26/2021   History of Present Illness 88 y.o. female with history of atrial flutter not on anticoagulation, dementia, HTN, HLD, stroke, hypothyroidism, hyponatremia, CKD 3 and anemia who presents after a transient episode of staring and not responding to questions followed by slurred speech.  This episode lasted about 15-30 minutes.  Patient has returned to baseline mental status, but has become quite hypertensive with SBP to the 200s while in the ED. Dx of multiple small infarcts L frontal and parietal lobes.   Clinical Impression   Patient is currently requiring assistance with ADLs including up to maximum assist with Lower body ADLs, minimal assist with Upper body ADLs,  as well as  minimal assist with bed mobility and up to maximum assist with functional transfers to recliner/BSC.  Current level of function is below patient's reported typical baseline, but no family present to confirm and pt is an unreliable historian.   During this evaluation, patient was limited by generalized weakness, impaired activity tolerance, and cognitive deficits, all of which has the potential to impact patient's safety and independence during functional mobility, as well as performance for ADLs.  Patient lives at [facility].  Patient demonstrates good rehab potential, and should benefit from continued skilled occupational therapy services while in acute care to maximize safety, independence and quality of life at home.  Continued occupational therapy services in a SNF setting prior to return home is recommended.  ?     Recommendations for follow up therapy are one component of a multi-disciplinary discharge planning process, led by the attending physician.  Recommendations may be updated based on patient status, additional functional criteria and insurance authorization.   Follow Up  Recommendations  Skilled nursing-short term rehab (<3 hours/day)    Assistance Recommended at Discharge Frequent or constant Supervision/Assistance  Patient can return home with the following A little help with bathing/dressing/bathroom;A lot of help with walking and/or transfers;Direct supervision/assist for financial management;Assist for transportation;Assistance with cooking/housework;Direct supervision/assist for medications management    Functional Status Assessment  Patient has had a recent decline in their functional status and demonstrates the ability to make significant improvements in function in a reasonable and predictable amount of time.  Equipment Recommendations       Recommendations for Other Services       Precautions / Restrictions Precautions Precautions: Fall Restrictions Weight Bearing Restrictions: No      Mobility Bed Mobility Overal bed mobility: Needs Assistance Bed Mobility: Supine to Sit     Supine to sit: Min guard, HOB elevated     General bed mobility comments: Cues for sequencing, increased time/effort. Rest break before able to scoot to EOB.    Transfers                          Balance Overall balance assessment: Needs assistance Sitting-balance support: Feet supported, No upper extremity supported Sitting balance-Leahy Scale: Good     Standing balance support: Reliant on assistive device for balance, During functional activity, Bilateral upper extremity supported Standing balance-Leahy Scale: Poor                             ADL either performed or assessed with clinical judgement   ADL Overall ADL's : Needs assistance/impaired Eating/Feeding: Set up;Supervision/ safety   Grooming: Sitting;Wash/dry face;Supervision/safety;Set up;Cueing for sequencing Grooming Details (  indicate cue type and reason): Max cues to initiate. Upper Body Bathing: Min guard;Cueing for sequencing;Sitting   Lower Body Bathing:  Moderate assistance;Sitting/lateral leans;Sit to/from stand   Upper Body Dressing : Minimal assistance;Sitting   Lower Body Dressing: Maximal assistance;Bed level;Sitting/lateral leans;Sit to/from stand Lower Body Dressing Details (indicate cue type and reason): Began mesh hospital underwear at bed level. Pt able to partially bridge but not  enough to bring over hips.  Once standing, pt donned over hips with Mod As and cues.  In recliner pt able to demonsterate modified figure 4 postion to doff and don socks with Min-moderate assist, increased time and effort. Toilet Transfer: Moderate assistance;Cueing for safety;Stand-pivot;Cueing for sequencing;Rolling walker (2 wheels) Toilet Transfer Details (indicate cue type and reason): Pt stood from EOB to RW with Moderate assist, cues, increased time/effort. Pt took ~4 side steps then began to pivot around to chair. Increasing need to Max Asto complete pivot and safely descend to chair with cues. Toileting- Clothing Manipulation and Hygiene: Maximal assistance Toileting - Clothing Manipulation Details (indicate cue type and reason): Pure wick in place.     Functional mobility during ADLs: Moderate assistance;Maximal assistance;Rolling walker (2 wheels);Cueing for safety;Cueing for sequencing       Vision Baseline Vision/History: 1 Wears glasses Patient Visual Report: No change from baseline Additional Comments: Able to accurately read clock on wall.     Perception     Praxis      Pertinent Vitals/Pain Pain Assessment Pain Assessment: No/denies pain     Hand Dominance Right   Extremity/Trunk Assessment Upper Extremity Assessment Upper Extremity Assessment: Generalized weakness (Grossly 3+/5 throughout RT and LT. May be some confusion re: following instructions.)   Lower Extremity Assessment Lower Extremity Assessment: Overall WFL for tasks assessed       Communication Communication Communication: HOH   Cognition Arousal/Alertness:  Awake/alert Behavior During Therapy: WFL for tasks assessed/performed Overall Cognitive Status: No family/caregiver present to determine baseline cognitive functioning                                 General Comments: pleasant, oriented to self, situation, and location, some word finding difficulty, able to follow one step commands.     General Comments       Exercises     Shoulder Instructions      Home Living Family/patient expects to be discharged to:: Assisted living                                 Additional Comments: pt reports she lives at [facility], pt described using a rollator "regularly". Pt denied useof DME to physical therapy and has dementia.  Prior level information is questionable.      Prior Functioning/Environment Prior Level of Function : Needs assist       Physical Assist : ADLs (physical)   ADLs (physical): Bathing;IADLs Mobility Comments: reports she walks to dining room with a rollator ADLs Comments: assist for bathing. Denies assistance with any other ADLs        OT Problem List: Decreased strength;Decreased cognition;Decreased activity tolerance;Decreased safety awareness;Decreased knowledge of use of DME or AE;Impaired balance (sitting and/or standing)      OT Treatment/Interventions: Self-care/ADL training;Therapeutic exercise;Therapeutic activities;DME and/or AE instruction;Patient/family education;Balance training    OT Goals(Current goals can be found in the care plan section) Acute Rehab OT Goals Patient  Stated Goal: Pt agreeable to getting out of bed to chair OT Goal Formulation: Patient unable to participate in goal setting Time For Goal Achievement: 12/10/21 Potential to Achieve Goals: Fair ADL Goals Pt Will Perform Grooming: standing;with supervision (at least 1 task) Pt Will Perform Upper Body Dressing: with set-up;sitting Pt Will Perform Lower Body Dressing: with set-up;with supervision;sit  to/from stand;sitting/lateral leans Pt Will Transfer to Toilet: with supervision;ambulating Pt Will Perform Toileting - Clothing Manipulation and hygiene: with supervision;sitting/lateral leans;sit to/from stand Pt/caregiver will Perform Home Exercise Program: Increased strength;Both right and left upper extremity;With Supervision  OT Frequency: Min 2X/week    Co-evaluation              AM-PAC OT "6 Clicks" Daily Activity     Outcome Measure Help from another person eating meals?: A Little Help from another person taking care of personal grooming?: A Little Help from another person toileting, which includes using toliet, bedpan, or urinal?: A Lot Help from another person bathing (including washing, rinsing, drying)?: A Lot Help from another person to put on and taking off regular upper body clothing?: A Little Help from another person to put on and taking off regular lower body clothing?: A Little 6 Click Score: 16   End of Session Equipment Utilized During Treatment: Gait belt;Rolling walker (2 wheels) Nurse Communication: Other (comment) (Pt in chair with belt/chair alarm in place)  Activity Tolerance: Patient tolerated treatment well Patient left: in chair;with call bell/phone within reach;with chair alarm set  OT Visit Diagnosis: Unsteadiness on feet (R26.81);Muscle weakness (generalized) (M62.81);History of falling (Z91.81);Repeated falls (R29.6)                Time: 4431-5400 OT Time Calculation (min): 55 min Charges:  OT General Charges $OT Visit: 1 Visit OT Evaluation $OT Eval Low Complexity: 1 Low OT Treatments $Self Care/Home Management : 8-22 mins $Therapeutic Activity: 23-37 mins  Anderson Malta, OT Acute Rehab Services Office: [phone] 11/26/2021  Julien Girt 11/26/2021, 11:48 AM

## 2021-11-26 NOTE — Consult Note (Signed)
Consultation Note Date: 11/26/2021   Patient Name: [name]  DOB: 1929-07-12  MRN: [id number]  Age / Sex: 88 y.o., female  PCP: Virgie Dad, MD Referring Physician: Kathie Dike, MD  Reason for Consultation:   HPI/Patient Profile: 88 y.o. female  with past medical history of dementia, hypertension, hyperlipidemia, atrial fibrillation, GERD, and glaucoma admitted on 11/24/2021 with acute CVA. Prior to this hospitalization, patient resided at [facility]. She had another stroke about a month ago and a history of frequent falls. Neurology following patient and has recommended anticoagulation. Aspirin started on 10/16 with plan to transition to Eliquis on 10/20. Overnight, patient had yellow MEWS, with BP 222/106. This morning, BP 179/83. PT following patient and has recommended ongoing therapy to increase independence and safety with mobility. Awaiting SLT evaluation. Palliative team involvement to establish goals of care for moving forward.  Presented to bedside for visit. Observed pulling cardiac monitoring leads off. Attempts made to redirect patient but she continues to pull at leads throughout visit. Observed alert. HOH. Patient able to state her name, year as 2023, and that she is at [facility]. She is unaware of her situation at this time and unable to relate history. Smile is symmetrical. Follows direction to stick out tongue and it is midline. Purposeful movement of arms and legs with weak grips and leg strength. Respirations even and unlabored on room air. Some faint wheezes noted to right upper lung field. Abdomen is soft and rounded. Skin is pale, hot-to-touch, and dry. Pure wick in place for incontinence of urine. Per bedside staff, patient consumed 25% of her breakfast, and 120 ml of fluid with staff assistance.  Primary Decision Maker NEXT OF KIN Bonnita Nasuti  Discussion: Call placed to daughter Almyra Free to introduce PMT and begin conversation about goals of care. Almyra Free verbalizes understanding of PMT role and appreciative for involvement. Verbalizes understanding of patient's current CVA and risk for future cardiovascular events. Discussed fluctuating blood pressures and pending SLT evaluation to assess swallowing ability. Almyra Free reports that family had a good visit with patient last night. She ate most of her supper and Almyra Free feels that patient is improving.   Family desires to pursue rehab and is hopeful for patient's continued improvement. Patient previously resided at [facility] reports that facility does not offer rehab. Hopeful for d/c to [facility] where she can receive rehab.  Almyra Free spoke with Neurology this morning and would like to speak with attending regarding specifics of treatment and discharge plan.  SUMMARY OF RECOMMENDATIONS   -Continued medical management by Attending and Neurology. -PMT to follow and assist with conversations as needed.  Code Status/Advance Care Planning: Full code   Prognosis:   Unable to determine  Discharge Planning: To Be Determined  Primary Diagnoses: Present on Admission:  Acute CVA (cerebrovascular accident) ([place])  Mixed Alzheimer's and vascular dementia ([place])  Essential hypertension  Atrial flutter (HCC)  GERD  HLD (hyperlipidemia)  Hypothyroidism  Hyponatremia  CKD (chronic kidney disease)  stage 3, GFR 30-59 ml/min (HCC)  AKI (acute kidney injury) ([place])  Stroke (cerebrum) ([place])   Review of Systems Patient unable to provide information.  Physical Exam Vitals reviewed.  HENT:     Mouth/Throat:     Mouth: Mucous membranes are dry.  Cardiovascular:     Rate and Rhythm: Rhythm irregular.  Abdominal:     General: There is distension.  Skin:    Coloration: Skin is pale.  Neurological:     Mental Status: She is alert.     Vital Signs: BP (!) 179/83  (BP Location: Left Arm)   Pulse 77   Temp 99 F (37.2 C) (Axillary)   Resp 18   Ht '5\' 3"'$  (1.6 m)   Wt 88.1 kg   SpO2 98%   BMI 34.41 kg/m  Pain Scale: 0-10   Pain Score: 0-No pain   SpO2: SpO2: 98 % O2 Device:SpO2: 98 % O2 Flow Rate:  Room Air  IO: Intake/output summary:  Intake/Output Summary (Last 24 hours) at 11/26/2021 0815 Last data filed at 11/26/2021 0500 Gross per 24 hour  Intake 1120 ml  Output 500 ml  Net 620 ml    LBM:   Baseline Weight: Weight: 90.7 kg Most recent weight: Weight: 88.1 kg       Thank you for this consult. Palliative medicine will continue to follow and assist as needed.  Time Total: 35 min Greater than 50%  of this time was spent counseling and coordinating care related to the above assessment and plan.  Signed by: Moss Mc, RN MSN CHPN/NP Student Palliative Medicine    Please contact Palliative Medicine Team phone at [phone] for questions and concerns.  For individual provider: See Shea Evans

## 2021-11-27 MED ORDER — APIXABAN 5 MG PO TABS: 5.0000 mg | ORAL_TABLET | Freq: Two times a day (BID) | ORAL | Status: DC

## 2021-11-27 MED ORDER — ATORVASTATIN CALCIUM 40 MG PO TABS: 40.0000 mg | ORAL_TABLET | Freq: Every day | ORAL | Status: DC

## 2021-11-27 MED ORDER — ASPIRIN 81 MG PO TBEC: 81.0000 mg | DELAYED_RELEASE_TABLET | Freq: Every day | ORAL | Status: AC

## 2021-11-27 NOTE — Care Management Important Message (Signed)
Important Message  Patient Details IM Letter placed in Patients room. Name: [name] MRN: [id number] Date of Birth: 1929/09/08   Medicare Important Message Given:  Yes     Kerin Salen 11/27/2021, 1:35 PM

## 2021-11-27 NOTE — Plan of Care (Signed)
Problem: Education: Goal: Knowledge of General Education information will improve Description: Including pain rating scale, medication(s)/side effects and non-pharmacologic comfort measures 11/27/2021 1255 by Annie Sable, RN Outcome: Adequate for Discharge 11/27/2021 1033 by Annie Sable, RN Outcome: Adequate for Discharge   Problem: Health Behavior/Discharge Planning: Goal: Ability to manage health-related needs will improve 11/27/2021 1255 by Annie Sable, RN Outcome: Adequate for Discharge 11/27/2021 1033 by Annie Sable, RN Outcome: Progressing   Problem: Clinical Measurements: Goal: Ability to maintain clinical measurements within normal limits will improve 11/27/2021 1255 by Annie Sable, RN Outcome: Adequate for Discharge 11/27/2021 1033 by Annie Sable, RN Outcome: Progressing Goal: Will remain free from infection 11/27/2021 1255 by Annie Sable, RN Outcome: Adequate for Discharge 11/27/2021 1033 by Annie Sable, RN Outcome: Progressing Goal: Diagnostic test results will improve 11/27/2021 1255 by Annie Sable, RN Outcome: Adequate for Discharge 11/27/2021 1033 by Annie Sable, RN Outcome: Progressing Goal: Respiratory complications will improve 11/27/2021 1255 by Annie Sable, RN Outcome: Adequate for Discharge 11/27/2021 1033 by Annie Sable, RN Outcome: Progressing Goal: Cardiovascular complication will be avoided 11/27/2021 1255 by Annie Sable, RN Outcome: Adequate for Discharge 11/27/2021 1033 by Annie Sable, RN Outcome: Progressing   Problem: Activity: Goal: Risk for activity intolerance will decrease 11/27/2021 1255 by Annie Sable, RN Outcome: Adequate for Discharge 11/27/2021 1033 by Annie Sable, RN Outcome: Progressing   Problem: Nutrition: Goal: Adequate nutrition will be maintained 11/27/2021 1255 by Annie Sable, RN Outcome: Adequate for Discharge 11/27/2021 1033 by Annie Sable,  RN Outcome: Adequate for Discharge   Problem: Coping: Goal: Level of anxiety will decrease 11/27/2021 1255 by Annie Sable, RN Outcome: Adequate for Discharge 11/27/2021 1033 by Annie Sable, RN Outcome: Adequate for Discharge   Problem: Elimination: Goal: Will not experience complications related to bowel motility 11/27/2021 1255 by Annie Sable, RN Outcome: Adequate for Discharge 11/27/2021 1033 by Annie Sable, RN Outcome: Adequate for Discharge Goal: Will not experience complications related to urinary retention 11/27/2021 1255 by Annie Sable, RN Outcome: Adequate for Discharge 11/27/2021 1033 by Annie Sable, RN Outcome: Adequate for Discharge   Problem: Pain Managment: Goal: General experience of comfort will improve 11/27/2021 1255 by Annie Sable, RN Outcome: Adequate for Discharge 11/27/2021 1033 by Annie Sable, RN Outcome: Adequate for Discharge   Problem: Safety: Goal: Ability to remain free from injury will improve 11/27/2021 1255 by Annie Sable, RN Outcome: Adequate for Discharge 11/27/2021 1033 by Annie Sable, RN Outcome: Adequate for Discharge   Problem: Skin Integrity: Goal: Risk for impaired skin integrity will decrease 11/27/2021 1255 by Annie Sable, RN Outcome: Adequate for Discharge 11/27/2021 1033 by Annie Sable, RN Outcome: Adequate for Discharge   Problem: Education: Goal: Knowledge of disease or condition will improve 11/27/2021 1255 by Annie Sable, RN Outcome: Adequate for Discharge 11/27/2021 1033 by Annie Sable, RN Outcome: Adequate for Discharge Goal: Knowledge of secondary prevention will improve (SELECT ALL) 11/27/2021 1255 by Annie Sable, RN Outcome: Adequate for Discharge 11/27/2021 1033 by Annie Sable, RN Outcome: Adequate for Discharge Goal: Knowledge of patient specific risk factors will improve (INDIVIDUALIZE FOR PATIENT) 11/27/2021 1255 by Annie Sable, RN Outcome:  Adequate for Discharge 11/27/2021 1033 by Annie Sable, RN Outcome: Adequate for Discharge   Problem: Nutrition: Goal: Risk of aspiration will decrease 11/27/2021 1255 by Annie Sable, RN Outcome:  Adequate for Discharge 11/27/2021 1033 by Annie Sable, RN Outcome: Adequate for Discharge

## 2021-11-27 NOTE — TOC Progression Note (Addendum)
Transition of Care [facility]) - Progression Note    Patient Details  Name: [name] MRN: [id number] Date of Birth: Apr 19, 1929  [facility]) CM/SW C[place], RN Phone Number: 11/27/2021, 10:32 AM  Clinical Narrative:   Left voicemail message for Norma Fredrickson SW with [facility], faxed patient out to [facility]. Awaiting call back.    Spoke with patient's daughter, explained process with short term SNF.  Spoke with [employer] SW with [facility] (ALF) who reports possible bed available at [facility], awaiting a call from [place] with [facility] to determine bed availability.  TOC will continue to follow.   - 11:37am received call from Dannette Barbara with [facility] who reports they have a bed available today, will await discharge summary.  This RNCM called patient's daughter Almyra Free to notify however reached voicemail that is full unable to leave a message.   TOC will continue to follow.   -12:28p spoke with patient's daughter   Almyra Free to advise of bed avaliablity with [facility], awaiting discharge summary, to coordinate PTAR transportation.  Expected Discharge Plan: [place] Barriers to Discharge: Continued Medical Work up  Expected Discharge Plan and Services Expected Discharge Plan: [place] In-house Referral: NA Discharge Planning Services: CM Consult Post Acute Care Choice: NA Living arrangements for the past 2 months: [place] ([place])                 DME Arranged: N/A DME Agency: NA       HH Arranged: NA HH Agency: NA         Social Determinants of Health (SDOH) Interventions    Readmission Risk Interventions     No data to display

## 2021-11-27 NOTE — Progress Notes (Signed)
Daily Progress Note   Patient Name: [name]       Date: 11/27/2021 DOB: 23-Feb-1929  Age: 88 y.o. MRN#: [id number] Attending Physician: Enzo Bi, MD Primary Care Physician: Virgie Dad, MD Admit Date: 11/24/2021  Reason for Consultation/Follow-up: Establishing goals of care  Patient Profile/HPI:  88 y.o. female  with past medical history of dementia, hypertension, hyperlipidemia, atrial fibrillation, GERD, and glaucoma admitted on 11/24/2021 with acute CVA. Prior to this hospitalization, patient resided at [facility]. She had another stroke about a month ago and a history of frequent falls. Neurology following patient and has recommended anticoagulation. Aspirin started on 10/16 with plan to transition to Eliquis on 10/20. Labile blood pressures that are being managed with PRN Hydralazine. PT following patient and has recommended ongoing therapy to increase independence and safety with mobility.   Presented to bedside for visit. Observed patient sitting up in chair feeding herself. Bedside staff report that patient required tray set-up and her food to be chopped up. Most food on patient's breakfast tray noted to be consumed. Patient is alert and pleasant. Some mild confusion, as per her baseline in the setting of dementia. Respirations even and unlabored. No indication of discomfort or distress.  Call placed to daughter Gara Kincade. She expresses gratitude for patient's continued improvement and is hopeful for patient's d/c to [facility] within the next couple of days.   Subjective: Patient unable to provide information.   Physical Exam Cardiovascular:     Rate and Rhythm: Rhythm irregular.  Pulmonary:     Effort: Pulmonary effort is normal.  Abdominal:      Palpations: Abdomen is soft.  Neurological:     Mental Status: She is alert. Mental status is at baseline.     Comments: Oriented x 2-3.  Psychiatric:        Mood and Affect: Mood normal.             Vital Signs: BP (!) 164/98 (BP Location: Left Arm)   Pulse 80   Temp 98.5 F (36.9 C) (Oral)   Resp 20   Ht '5\' 3"'$  (1.6 m)   Wt 88.1 kg   SpO2 95%   BMI 34.41 kg/m  SpO2: SpO2: 95 % O2 Device: O2 Device: Room Air O2 Flow Rate:    Intake/output  summary:  Intake/Output Summary (Last 24 hours) at 11/27/2021 0910 Last data filed at 11/27/2021 0644 Gross per 24 hour  Intake 960.99 ml  Output 862 ml  Net 98.99 ml   LBM: Last BM Date : 11/26/21 Baseline Weight: Weight: 90.7 kg Most recent weight: Weight: 88.1 kg       Palliative Assessment/Data:      Patient Active Problem List   Diagnosis Date Noted   Stroke (cerebrum) ([place]) 11/25/2021   TIA (transient ischemic attack) 11/24/2021   Acute CVA (cerebrovascular accident) ([place]) 11/24/2021   Glaucoma 11/24/2021   Mixed Alzheimer's and vascular dementia ([place]) 11/07/2021   Candidiasis of female genitalia 03/05/2021   Hypothyroidism 03/05/2021   Atrial flutter ([place]) 03/05/2021   Cognitive impairment 03/05/2021   Increased urinary frequency 07/13/2020   COVID-19 virus RNA test result positive at limit of detection 06/27/2020   Syncope 04/16/2020   AKI (acute kidney injury) ([place]) 04/16/2020   Orthostatic hypotension 04/07/2020   Syncope and collapse 04/06/2020   Fall at home, initial encounter 04/06/2020   Acute right ankle pain 04/06/2020   Acute lower UTI 10/07/2019   Diarrhea 10/07/2019   Acute cystitis without hematuria    Muscle spasm of right lower extremity 08/05/2019   Hyponatremia 08/05/2019   Insomnia 08/05/2019   Slow transit constipation 08/05/2019   Atherosclerosis of abdominal aorta ([place]) 10/02/2015   Obesity (BMI 30-39.9) 10/31/2014   CKD (chronic kidney disease) stage 3, GFR 30-59 ml/min (HCC)  10/31/2014   Ventral incisional hernia 10/31/2014   Primary peritoneal carcinomatosis ([place]) 10/31/2014   Incisional hernia, periumbilical, without obstruction or gangrene 11/03/2012   Anemia associated with acute blood loss 04/03/2011   Ovarian cancer ([place]) 01/25/2011   HLD (hyperlipidemia) 04/23/2009   Essential hypertension 04/23/2009   GERD 04/23/2009   Osteoarthritis 04/23/2009   Osteoporosis 04/23/2009    Palliative Care Assessment & Plan    Assessment/Recommendations/Plan  Continue supportive measures: blood pressure management, PT, SLT.  Continue discharge planning for rehab. PMT signing off and will remain available for any future needs.   Code Status: Full code  Prognosis: Unable to determine  Discharge Planning: Daughter hopeful for discharge to [facility] for [facility]hab.  Care plan was discussed with daughter Alsha Meland.  Thank you for allowing the Palliative Medicine Team to assist in the care of this patient.   Moss Mc, RN MSN [facility] / NP Student Palliative Medicine   Please contact Palliative Medicine Team phone at [phone] for questions and concerns.

## 2021-11-27 NOTE — TOC Transition Note (Addendum)
Transition of Care [facility]) - CM/SW Discharge Note   Patient Details  Name: [name] MRN: [id number] Date of Birth: [birth year]  Transition of [facility]) CM/SW Contact:  Roseanne Kaufman, RN Phone Number: 11/27/2021, 1:09 PM   Clinical Narrative:   Discharge summary has been sent to [facility] via [facility]. Report can be called to [phone], [facility] room#[id number]. This RNCM will coordinate PTAR for transportation.   TOC will conitinue to follow.  - 1:50pm spoke with patient at bedside to advise of SNF bed and transportation with [facility]. Patient verbalized understanding.  PTAR called, notified RN, MD   Barriers to Discharge: Continued Medical Work up   Patient Goals and CMS Choice Patient states their goals for this hospitalization and ongoing recovery are:: short term rehab CMS Medicare.gov Compare Post Acute Care list provided to:: Patient Represenative (must comment) Emylie Amster (dtr) [phone]) Choice offered to / list presented to : Adult Children  Discharge Placement                       Discharge Plan and Services In-house Referral: NA Discharge Planning Services: CM Consult Post Acute Care Choice: NA          DME Arranged: N/A DME Agency: NA       HH Arranged: NA HH Agency: NA        Social Determinants of Health (SDOH) Interventions     Readmission Risk Interventions     No data to display

## 2021-11-27 NOTE — Discharge Summary (Signed)
Physician Discharge Summary   [name]  female DOB: 11-09-1929  UQJ:[id number]  PCP: Jennifer Dad, MD  Admit date: 11/24/2021 Discharge date: 11/27/2021  Admitted From: ALF Disposition:  SNF rehab CODE STATUS: Full code  Discharge Instructions     Diet - low sodium heart healthy   Complete by: As directed    No wound care   Complete by: As directed       Hospital Course:  For full details, please see H&P, progress notes, consult notes and ancillary notes.  Briefly,  [name] is a 88 y/o female, resident of ALF, with history of dementia, A flutter, HTN, presented with confusion and slurred speech. Found to have acute CVA on MRI brain.   Acute CVA -noted to have left PCA embolic appearing strokes -history of A fib/flutter, she has not been on anticoagulation -CTA head and neck does not show any LVO. It does note high grade stenosis of right M3 and right A2 segment. No hemodynamically significant stenosis in carotids. Vertebral arteries patent -LDL 127, started atorvastatin -A1c 5.7 -Per neuro, no need for echocardiogram due to known atrial fibrillation and need for anticoagulation -Pt is on ASA 81 daily until 10/20 when pt will start Eliquis 5 mg twice daily  -Follow-up neurology outpatient   CKD stage 3a -creatinine 1.2, near baseline   Hypertension -home meds show just hydralazine prn -Discussed with patient's daughter, she reports that as needed hydralazine has worked well for the patient prior to admission -She reports that patient's blood pressure is labile at times and would be concerned regarding starting a daily medication -continue on as needed hydralazine for now and monitor blood pressure trends   Hypothyroidism -continue synthroid   GERD -continue PPI   Dementia -mental status appears to be near baseline   Obesity, class I -BMI 34.4   Discharge Diagnoses:  Principal Problem:   Acute CVA (cerebrovascular accident)  ([name]) Active Problems:   HLD (hyperlipidemia)   Essential hypertension   GERD   CKD (chronic kidney disease) stage 3, GFR 30-59 ml/min (HCC)   Hyponatremia   AKI (acute kidney injury) ([name])   Hypothyroidism   Atrial flutter ([name])   Mixed Alzheimer's and vascular dementia ([name])   Glaucoma   Stroke (cerebrum) ([name])   30 Day Unplanned Readmission Risk Score    Flowsheet Row ED to Hosp-Admission (Current) from 11/24/2021 in [facility]  30 Day Unplanned Readmission Risk Score (%) 17.56 Filed at 11/27/2021 1200       This score is the patient's risk of an unplanned readmission within 30 days of being discharged (0 -100%). The score is based on dignosis, age, lab data, medications, orders, and past utilization.   Low:  0-14.9   Medium: 15-21.9   High: 22-29.9   Extreme: 30 and above         Discharge Instructions:  Allergies as of 11/27/2021       Reactions   Morphine And Related Anaphylaxis, Other (See Comments)   Given after knee replacement and code blue occurred   Morphine Sulfate Anaphylaxis   Penicillins Hives, Itching, Other (See Comments)   Syncope, also   Nsaids Other (See Comments)   Because of an abnormal kidney test result   Penicillin V Hives   Shellfish Allergy Nausea And Vomiting, Other (See Comments)   Pt has shellfish allergy only.  Has had IV contrast x 2 and did fine.   Shellfish-derived Products Nausea  And Vomiting        Medication List     TAKE these medications    acetaminophen 500 MG tablet Commonly known as: TYLENOL Take 1,000 mg by mouth every 8 (eight) hours as needed.   apixaban 5 MG Tabs tablet Commonly known as: ELIQUIS Take 1 tablet (5 mg total) by mouth 2 (two) times daily. Start taking on: November 29, 2021   aspirin EC 81 MG tablet Take 1 tablet (81 mg total) by mouth daily for 1 day. Swallow whole. Start taking on: November 28, 2021   atorvastatin 40 MG tablet Commonly known as:  LIPITOR Take 1 tablet (40 mg total) by mouth daily. Start taking on: November 28, 2021   azelastine 0.1 % nasal spray Commonly known as: ASTELIN Place 1 spray into both nostrils daily as needed for rhinitis or allergies.   B-COMPLEX/FOLIC ACID/VITAMIN C PO Take 1 tablet by mouth daily with breakfast.   bimatoprost 0.03 % ophthalmic solution Commonly known as: LUMIGAN Place 1 drop into both eyes at bedtime.   calcium carbonate 750 MG chewable tablet Commonly known as: TUMS EX Chew 750 mg by mouth daily as needed for heartburn.   cycloSPORINE 0.05 % ophthalmic emulsion Commonly known as: RESTASIS Place 1 drop into both eyes 2 (two) times daily.   diclofenac Sodium 1 % Gel Commonly known as: VOLTAREN Apply 2 g topically in the morning and at bedtime.   docusate sodium 100 MG capsule Commonly known as: COLACE Take 100 mg by mouth daily.   hydrALAZINE 10 MG tablet Commonly known as: APRESOLINE Take 10 mg by mouth 2 (two) times daily as needed.   levothyroxine 25 MCG tablet Commonly known as: SYNTHROID Take 25 mcg by mouth daily before breakfast.   meclizine 12.5 MG tablet Commonly known as: ANTIVERT Take 1 tablet (12.5 mg total) by mouth 2 (two) times daily as needed for dizziness.   melatonin 3 MG Tabs tablet Take 1 tablet (3 mg total) by mouth at bedtime.   miconazole 2 % powder Commonly known as: MICOTIN Apply topically daily. Patient may apply to dry skin under breasts daily and after showering   Myrbetriq 25 MG Tb24 tablet Generic drug: mirabegron ER Take 25 mg by mouth daily.   nystatin powder Commonly known as: MYCOSTATIN/NYSTOP Apply 1 application topically 2 (two) times daily as needed.   omeprazole 20 MG capsule Commonly known as: PRILOSEC Take 20 mg by mouth daily.   sodium chloride 1 g tablet Take 1 g by mouth 2 (two) times daily.   Vitamin D3 50 MCG (2000 UT) Tabs Take 2,000 Units by mouth in the morning.   zinc oxide 20 % ointment Apply 1  application topically as needed (for redness- to be applied to buttocks/peri area and after every incontinent episode).         Contact information for follow-up providers     Jennifer Dad, MD Follow up.   Specialty: Internal Medicine Contact information: [place] [phone]              Contact information for after-discharge care     Destination     [facility] .   Service: Skilled Nursing Contact information: [place] [phone]                     Allergies  Allergen Reactions   Morphine And Related Anaphylaxis and Other (See Comments)  Given after knee replacement and code blue occurred   Morphine Sulfate Anaphylaxis   Penicillins Hives, Itching and Other (See Comments)    Syncope, also   Nsaids Other (See Comments)    Because of an abnormal kidney test result   Penicillin V Hives   Shellfish Allergy Nausea And Vomiting and Other (See Comments)    Pt has shellfish allergy only.  Has had IV contrast x 2 and did fine.   Shellfish-Derived Products Nausea And Vomiting     The results of significant diagnostics from this hospitalization (including imaging, microbiology, ancillary and laboratory) are listed below for reference.   Consultations:   Procedures/Studies: US RENAL  Result Date: 11/24/2021 CLINICAL DATA:  Acute kidney injury EXAM: RENAL / URINARY TRACT ULTRASOUND COMPLETE COMPARISON:  None Available. FINDINGS: Right Kidney: Renal measurements: 7.9 x 3.8 x 4.1 cm = volume: 65 mL. Echogenicity within normal limits. No mass or hydronephrosis visualized. Left Kidney: Renal measurements: 8.7 x 4.7 x 4.4 cm = volume: 94 mL. Echogenicity within normal limits. No mass or hydronephrosis visualized. Bladder: Appears normal for degree of bladder distention. Other: None. IMPRESSION: Normal renal ultrasound. Electronically Signed   By: Ulyses Jarred  M.D.   On: 11/24/2021 22:07   CT ANGIO HEAD NECK W WO CM  Result Date: 11/24/2021 CLINICAL DATA:  Stroke/TIA, determine embolic source.  Confusion. EXAM: CT ANGIOGRAPHY HEAD AND NECK TECHNIQUE: Multidetector CT imaging of the head and neck was performed using the standard protocol during bolus administration of intravenous contrast. Multiplanar CT image reconstructions and MIPs were obtained to evaluate the vascular anatomy. Carotid stenosis measurements (when applicable) are obtained utilizing NASCET criteria, using the distal internal carotid diameter as the denominator. RADIATION DOSE REDUCTION: This exam was performed according to the departmental dose-optimization program which includes automated exposure control, adjustment of the mA and/or kV according to patient size and/or use of iterative reconstruction technique. CONTRAST:  58m OMNIPAQUE IOHEXOL 350 MG/ML SOLN COMPARISON:  Noncontrast head CT and brain MRI obtained earlier the same day FINDINGS: CTA NECK FINDINGS Aortic arch: There is mild calcified plaque in the imaged aortic arch. The origins of the major branch vessels are patent. The subclavian arteries are patent to the level imaged. Right carotid system: The right common carotid artery is patent. There is calcified plaque of the bifurcation resulting in less than 50% stenosis. Distal internal carotid artery is widely patent. The external carotid artery is patent. There is no dissection or aneurysm. Left carotid system: Left common, internal, and external carotid arteries are patent, with mild plaque at the bifurcation but no hemodynamically significant stenosis or occlusion. There is no dissection or aneurysm. Vertebral arteries: The vertebral arteries are patent, without hemodynamically significant stenosis or occlusion. There is no dissection or aneurysm. Skeleton: There is mild for age degenerative change of the cervical spine. There is no acute osseous abnormality or suspicious osseous  lesion. There is no visible canal hematoma. Other neck: The soft tissues of the neck are unremarkable. Upper chest: The imaged lung apices are clear. Review of the MIP images confirms the above findings CTA HEAD FINDINGS Anterior circulation: There is calcified plaque in the intracranial ICAs without greater than mild stenosis on either side. The bilateral MCAs are patent. There is focal severe stenosis of a right M3 segment (8-134, 11-29). There is no other high-grade stenosis or occlusion. The right A1 segment is hypoplastic/absent, likely congenital. The left A1 segment is patent. Anterior communicating artery is normal. There is short-segment severe stenosis  of the right A2 segment (7-289, 8-95). There is distal reconstitution after the stenosis. There is no high-grade stenosis or occlusion on the left. There is no aneurysm or AVM Posterior circulation: The bilateral V4 segments are patent with mild calcified plaque on the right. The basilar artery is patent. The major cerebellar arteries appear patent. The bilateral PCAs are patent, without proximal stenosis or occlusion. There is atherosclerotic irregularity of the distal branches. There is a fetal origin of the right PCA. There is no aneurysm or AVM. Venous sinuses: As permitted by contrast timing, patent. Anatomic variants: As above. Review of the MIP images confirms the above findings IMPRESSION: 1. No emergent large vessel occlusion. 2. Intracranial atherosclerotic disease resulting in focal high-grade stenosis of a right M3 segment and right A2 segment. Mild atherosclerotic irregularity elsewhere. 3. Calcified plaque at the carotid bifurcations, right worse than left, without hemodynamically significant stenosis or occlusion. Patent vertebral arteries in the neck. Electronically Signed   By: Valetta Mole M.D.   On: 11/24/2021 16:01   MR BRAIN WO CONTRAST  Result Date: 11/24/2021 CLINICAL DATA:  episode of AMS, dysarthria now resolved EXAM: MRI HEAD  WITHOUT CONTRAST TECHNIQUE: Multiplanar, multiecho pulse sequences of the brain and surrounding structures were obtained without intravenous contrast. COMPARISON:  CT head from the same day. FINDINGS: Motion limited study. Brain: Punctate acute infarct in the high right posterior frontal lobe (series 5, images 34 and 28). Additional small acute cortical and subcortical infarcts in the left parietal lobe (for example series 5 image 28). No significant mass effect. Scattered T2/FLAIR hyperintensities limited, compatible chronic microvascular disease. Chronic 1 cm right parafalcine meningioma without significant mass effect. No evidence of acute hemorrhage, midline shift or hydrocephalus. Cerebral atrophy. Vascular: Major arterial flow voids are maintained skull base. Skull and upper cervical spine: Normal marrow signal. Sinuses/Orbits: No acute findings. Other: No mastoid effusions. IMPRESSION: 1. Multiple small acute infarcts in the left frontal and parietal lobes. 2. Chronic 1 cm right parafalcine meningioma without significant mass effect. Electronically Signed   By: Margaretha Sheffield M.D.   On: 11/24/2021 14:15   CT HEAD WO CONTRAST (5MM)  Result Date: 11/24/2021 CLINICAL DATA:  Altered mental status EXAM: CT HEAD WITHOUT CONTRAST TECHNIQUE: Contiguous axial images were obtained from the base of the skull through the vertex without intravenous contrast. RADIATION DOSE REDUCTION: This exam was performed according to the departmental dose-optimization program which includes automated exposure control, adjustment of the mA and/or kV according to patient size and/or use of iterative reconstruction technique. COMPARISON:  04/06/2020 FINDINGS: Brain: No acute intracranial findings are seen in noncontrast CT brain. There are no signs of bleeding within the cranium. Cortical sulci are prominent. Small old lacunar infarcts are seen in basal ganglia. There is decreased density in periventricular and subcortical white  matter. There is a dense calcification, possibly calcified meningioma measuring 14 x 11 mm in the right parafalcine location in the posterior right parietal lobe with no significant change. There is no adjacent edema or mass effect. Vascular: Unremarkable. Skull: Unremarkable. Sinuses/Orbits: There is almost complete opacification of left side of sphenoid sinus with no significant interval change. Other: None. IMPRESSION: No acute intracranial findings are seen in noncontrast CT brain. Atrophy.  Small-vessel disease. Stable calcified meningioma in right parafalcine location. Chronic sphenoid sinusitis. Electronically Signed   By: Elmer Picker M.D.   On: 11/24/2021 10:08      Labs: BNP (last 3 results) No results for input(s): "BNP" in the last 8760 hours.  Basic Metabolic Panel: Recent Labs  Lab 11/24/21 1023 11/26/21 0420  NA 135 135  K 3.9 3.7  CL 101 104  CO2 26 23  GLUCOSE 122* 109*  BUN 22 29*  CREATININE 1.20* 1.23*  CALCIUM 9.5 8.7*   Liver Function Tests: Recent Labs  Lab 11/24/21 1023  AST 23  ALT 16  ALKPHOS 67  BILITOT 0.6  PROT 8.5*  ALBUMIN 4.2   No results for input(s): "LIPASE", "AMYLASE" in the last 168 hours. No results for input(s): "AMMONIA" in the last 168 hours. CBC: Recent Labs  Lab 11/24/21 1023 11/26/21 0420  WBC 8.2 8.9  NEUTROABS 6.6  --   HGB 13.9 12.7  HCT 44.4 40.1  MCV 84.6 84.2  PLT 237 202   Cardiac Enzymes: No results for input(s): "CKTOTAL", "CKMB", "CKMBINDEX", "TROPONINI" in the last 168 hours. BNP: Invalid input(s): "POCBNP" CBG: No results for input(s): "GLUCAP" in the last 168 hours. D-Dimer No results for input(s): "DDIMER" in the last 72 hours. Hgb A1c Recent Labs    11/24/21 2130  HGBA1C 5.7*   Lipid Profile Recent Labs    11/25/21 0500  CHOL 181  HDL 37*  LDLCALC 127*  TRIG 85  CHOLHDL 4.9   Thyroid function studies No results for input(s): "TSH", "T4TOTAL", "T3FREE", "THYROIDAB" in the last 72  hours.  Invalid input(s): "FREET3" Anemia work up No results for input(s): "VITAMINB12", "FOLATE", "FERRITIN", "TIBC", "IRON", "RETICCTPCT" in the last 72 hours. Urinalysis    Component Value Date/Time   COLORURINE YELLOW 11/24/2021 [place] 11/24/2021 1023   LABSPEC 1.010 11/24/2021 1023   LABSPEC 1.010 04/21/2011 1324   PHURINE 7.0 11/24/2021 1023   GLUCOSEU NEGATIVE 11/24/2021 1023   HGBUR NEGATIVE 11/24/2021 1023   BILIRUBINUR NEGATIVE 11/24/2021 1023   BILIRUBINUR Negative 04/21/2011 1324   KETONESUR NEGATIVE 11/24/2021 1023   PROTEINUR 100 (A) 11/24/2021 1023   UROBILINOGEN 0.2 05/18/2011 2014   NITRITE NEGATIVE 11/24/2021 1023   LEUKOCYTESUR NEGATIVE 11/24/2021 1023   LEUKOCYTESUR Moderate 04/21/2011 1324   Sepsis Labs Recent Labs  Lab 11/24/21 1023 11/26/21 0420  WBC 8.2 8.9   Microbiology No results found for this or any previous visit (from the past 240 hour(s)).   Total time spend on discharging this patient, including the last patient exam, discussing the hospital stay, instructions for ongoing care as it relates to all pertinent caregivers, as well as preparing the medical discharge records, prescriptions, and/or referrals as applicable, is 35 minutes.    Enzo Bi, MD  [facility] 11/27/2021, 12:57 PM

## 2021-11-27 NOTE — Progress Notes (Signed)
Mobility Specialist - Progress Note   11/27/21 1017  Mobility  Activity Ambulated with assistance in room  Level of Assistance Contact guard assist, steadying assist  Assistive Device Front wheel walker  Distance Ambulated (ft) 20 ft  Range of Motion/Exercises Active  Activity Response Tolerated well  Mobility Referral Yes  $Mobility charge 1 Mobility   Pt was found on recliner chair and agreeable to mobilize in room. Pt was able to ambulate across the room 32f and took a seated rest break and proceeded to ambulate back to recliner chair. During session stated she felt wobbly. At EOS returned to recliner chair with all necessities in reach and RN in room.  BFerd HibbsMobility Specialist

## 2021-11-27 NOTE — NC FL2 (Signed)
[place] LEVEL OF CARE SCREENING TOOL     IDENTIFICATION  Patient Name: [name] Birthdate: 08/05/1929 Sex: female Admission Date (Current Location): 11/24/2021  [facility] and [place] Number:  Herbalist and Address:  [facility],  [address] [place], [place]      Provider Number: [id number]  Attending Physician Name and Address:  Enzo Bi, MD  Relative Name and Phone Number:  Carletha Dawn (dtr) [phone]    Current Level of Care: Hospital Recommended Level of Care: [place] Prior Approval Number:    Date Approved/Denied:   PASRR Number: [id number]  Discharge Plan: SNF    Current Diagnoses: Patient Active Problem List   Diagnosis Date Noted   Stroke (cerebrum) ([place]) 11/25/2021   TIA (transient ischemic attack) 11/24/2021   Acute CVA (cerebrovascular accident) ([place]) 11/24/2021   Glaucoma 11/24/2021   Mixed Alzheimer's and vascular dementia ([place]) 11/07/2021   Candidiasis of female genitalia 03/05/2021   Hypothyroidism 03/05/2021   Atrial flutter ([place]) 03/05/2021   Cognitive impairment 03/05/2021   Increased urinary frequency 07/13/2020   COVID-19 virus RNA test result positive at limit of detection 06/27/2020   Syncope 04/16/2020   AKI (acute kidney injury) ([place]) 04/16/2020   Orthostatic hypotension 04/07/2020   Syncope and collapse 04/06/2020   Fall at home, initial encounter 04/06/2020   Acute right ankle pain 04/06/2020   Acute lower UTI 10/07/2019   Diarrhea 10/07/2019   Acute cystitis without hematuria    Muscle spasm of right lower extremity 08/05/2019   Hyponatremia 08/05/2019   Insomnia 08/05/2019   Slow transit constipation 08/05/2019   Atherosclerosis of abdominal aorta ([place]) 10/02/2015   Obesity (BMI 30-39.9) 10/31/2014   CKD (chronic kidney disease) stage 3, GFR 30-59 ml/min (HCC) 10/31/2014   Ventral incisional hernia 10/31/2014   Primary peritoneal carcinomatosis ([place])  10/31/2014   Incisional hernia, periumbilical, without obstruction or gangrene 11/03/2012   Anemia associated with acute blood loss 04/03/2011   Ovarian cancer ([place]) 01/25/2011   HLD (hyperlipidemia) 04/23/2009   Essential hypertension 04/23/2009   GERD 04/23/2009   Osteoarthritis 04/23/2009   Osteoporosis 04/23/2009    Orientation RESPIRATION BLADDER Height & Weight     Self  Normal Continent Weight: 88.1 kg Height:  '5\' 3"'$  (160 cm)  BEHAVIORAL SYMPTOMS/MOOD NEUROLOGICAL BOWEL NUTRITION STATUS      Continent Diet (heart healthy, thin fluids)  AMBULATORY STATUS COMMUNICATION OF NEEDS Skin   Limited Assist Verbally Other (Comment) (bil buttock pressure injury: purple /maroon localized area, skin intact)                       Personal Care Assistance Level of Assistance  Bathing, Feeding, Dressing Bathing Assistance: Limited assistance Feeding assistance: Limited assistance Dressing Assistance: Limited assistance     Functional Limitations Info  Hearing, Sight, Speech Sight Info: Adequate Hearing Info: Impaired (HOH) Speech Info: Impaired (slurred speech)    SPECIAL CARE FACTORS FREQUENCY  PT (By licensed PT), OT (By licensed OT)     PT Frequency: 5x per week OT Frequency: 5x peer week            Contractures Contractures Info: Not present    Additional Factors Info  Code Status, Allergies Code Status Info: Full Allergies Info: Morphine And Related, Morphine Sulfate, Penicillins, Nsaids, Penicillin V, Shellfish Allergy, Shellfish-derived Products           Current Medications (11/27/2021):  This is the current hospital active medication list  Current Facility-Administered Medications  Medication Dose Route Frequency Provider Last Rate Last Admin   acetaminophen (TYLENOL) tablet 650 mg  650 mg Oral Q4H PRN Marylyn Ishihara, Tyrone A, DO       Or   acetaminophen (TYLENOL) 160 MG/5ML solution 650 mg  650 mg Per Tube Q4H PRN Marylyn Ishihara, Tyrone A, DO       Or   acetaminophen  (TYLENOL) suppository 650 mg  650 mg Rectal Q4H PRN Marylyn Ishihara, Tyrone A, DO       aspirin EC tablet 81 mg  81 mg Oral Daily Memon, Jolaine Artist, MD   81 mg at 11/27/21 1018   atorvastatin (LIPITOR) tablet 40 mg  40 mg Oral Daily Kathie Dike, MD   40 mg at 11/27/21 1018   cycloSPORINE (RESTASIS) 0.05 % ophthalmic emulsion 1 drop  1 drop Both Eyes BID Kathie Dike, MD   1 drop at 11/27/21 1018   docusate sodium (COLACE) capsule 100 mg  100 mg Oral Daily Kathie Dike, MD   100 mg at 11/27/21 1018   hydrALAZINE (APRESOLINE) tablet 25 mg  25 mg Oral Q6H PRN Kathie Dike, MD       latanoprost (XALATAN) 0.005 % ophthalmic solution 1 drop  1 drop Both Eyes QHS Kathie Dike, MD   1 drop at 11/26/21 2228   levothyroxine (SYNTHROID) tablet 25 mcg  25 mcg Oral QAC breakfast Kathie Dike, MD   25 mcg at 11/27/21 0700   mirabegron ER (MYRBETRIQ) tablet 25 mg  25 mg Oral Daily Kathie Dike, MD   25 mg at 11/27/21 1018   pantoprazole (PROTONIX) EC tablet 40 mg  40 mg Oral Daily Kathie Dike, MD   40 mg at 11/27/21 1018     Discharge Medications: Please see discharge summary for a list of discharge medications.  Relevant Imaging Results:  Relevant Lab Results:   Additional Information SSN: [id number], Eusebio Friendly 12/26/2019,03/14/2019, 02/14/2019  Roseanne Kaufman, RN

## 2021-11-27 NOTE — Progress Notes (Signed)
Pt discharged to SNF, [facility], today per Dr. Billie Ruddy. Pt's IV site d/c'd and WDL. Pt's VSS. Report called to Corey Skains, receiving nurse at facility. Verbalized understanding. Pt left floor via stretcher in stable condition accompanied by PTAR.

## 2021-11-27 NOTE — Plan of Care (Signed)
  Problem: Health Behavior/Discharge Planning: Goal: Ability to manage health-related needs will improve Outcome: Progressing   Problem: Clinical Measurements: Goal: Ability to maintain clinical measurements within normal limits will improve Outcome: Progressing Goal: Will remain free from infection Outcome: Progressing Goal: Diagnostic test results will improve Outcome: Progressing Goal: Respiratory complications will improve Outcome: Progressing Goal: Cardiovascular complication will be avoided Outcome: Progressing   Problem: Activity: Goal: Risk for activity intolerance will decrease Outcome: Progressing   Problem: Education: Goal: Knowledge of General Education information will improve Description: Including pain rating scale, medication(s)/side effects and non-pharmacologic comfort measures Outcome: Adequate for Discharge   Problem: Nutrition: Goal: Adequate nutrition will be maintained Outcome: Adequate for Discharge   Problem: Coping: Goal: Level of anxiety will decrease Outcome: Adequate for Discharge   Problem: Elimination: Goal: Will not experience complications related to bowel motility Outcome: Adequate for Discharge Goal: Will not experience complications related to urinary retention Outcome: Adequate for Discharge   Problem: Pain Managment: Goal: General experience of comfort will improve Outcome: Adequate for Discharge   Problem: Safety: Goal: Ability to remain free from injury will improve Outcome: Adequate for Discharge   Problem: Skin Integrity: Goal: Risk for impaired skin integrity will decrease Outcome: Adequate for Discharge   Problem: Education: Goal: Knowledge of disease or condition will improve Outcome: Adequate for Discharge Goal: Knowledge of secondary prevention will improve (SELECT ALL) Outcome: Adequate for Discharge Goal: Knowledge of patient specific risk factors will improve (INDIVIDUALIZE FOR PATIENT) Outcome: Adequate for  Discharge   Problem: Nutrition: Goal: Risk of aspiration will decrease Outcome: Adequate for Discharge

## 2021-11-29 ENCOUNTER — Encounter: Payer: Self-pay | Admitting: Nurse Practitioner

## 2021-11-29 ENCOUNTER — Non-Acute Institutional Stay (SKILLED_NURSING_FACILITY): Payer: Medicare Other | Admitting: Nurse Practitioner

## 2021-11-29 DIAGNOSIS — F0150 Vascular dementia without behavioral disturbance: Secondary | ICD-10-CM

## 2021-11-29 DIAGNOSIS — R350 Frequency of micturition: Secondary | ICD-10-CM

## 2021-11-29 DIAGNOSIS — E039 Hypothyroidism, unspecified: Secondary | ICD-10-CM

## 2021-11-29 DIAGNOSIS — K5901 Slow transit constipation: Secondary | ICD-10-CM

## 2021-11-29 DIAGNOSIS — F5101 Primary insomnia: Secondary | ICD-10-CM

## 2021-11-29 DIAGNOSIS — I639 Cerebral infarction, unspecified: Secondary | ICD-10-CM | POA: Diagnosis not present

## 2021-11-29 DIAGNOSIS — E871 Hypo-osmolality and hyponatremia: Secondary | ICD-10-CM | POA: Diagnosis not present

## 2021-11-29 DIAGNOSIS — F0280 Dementia in other diseases classified elsewhere without behavioral disturbance: Secondary | ICD-10-CM

## 2021-11-29 DIAGNOSIS — G309 Alzheimer's disease, unspecified: Secondary | ICD-10-CM

## 2021-11-29 DIAGNOSIS — M810 Age-related osteoporosis without current pathological fracture: Secondary | ICD-10-CM

## 2021-11-29 DIAGNOSIS — R55 Syncope and collapse: Secondary | ICD-10-CM

## 2021-11-29 DIAGNOSIS — I4892 Unspecified atrial flutter: Secondary | ICD-10-CM

## 2021-11-29 DIAGNOSIS — C569 Malignant neoplasm of unspecified ovary: Secondary | ICD-10-CM

## 2021-11-29 DIAGNOSIS — N1831 Chronic kidney disease, stage 3a: Secondary | ICD-10-CM

## 2021-11-29 DIAGNOSIS — E785 Hyperlipidemia, unspecified: Secondary | ICD-10-CM

## 2021-11-29 DIAGNOSIS — K219 Gastro-esophageal reflux disease without esophagitis: Secondary | ICD-10-CM

## 2021-11-29 DIAGNOSIS — I10 Essential (primary) hypertension: Secondary | ICD-10-CM

## 2021-11-29 NOTE — Assessment & Plan Note (Signed)
No change, on Mirabegron 

## 2021-11-29 NOTE — Progress Notes (Unsigned)
Location:   SNF [address] Room Number: 16 Place of Service:  SNF (31) Provider: Lennie Odor Marchell Froman NP  Virgie Dad, MD  Patient Care Team: Virgie Dad, MD as PCP - General (Internal Medicine) Donato Heinz, MD as PCP - Cardiology (Cardiology) Brien Few, MD as Consulting Physician (Obstetrics and Gynecology) Marti Sleigh, MD as Consulting Physician (Gynecology) Nancy Marus, MD as Consulting Physician (Gynecologic Oncology)  Extended Emergency Contact Information Primary Emergency Contact: [facility] Address: [address]          [place], [place] [address] [name] of [place] Phone: [phone] Mobile Phone: [phone] Relation: Daughter  Code Status: DNR Goals of care: Advanced Directive information    11/29/2021    2:37 PM  Advanced Directives  Does Patient Have a Medical Advance Directive? Yes  Type of Advance Directive [place] in Chart? Yes - validated most recent copy scanned in chart (See row information)     Chief Complaint  Patient presents with   Acute Visit    Medication review following hospital stay.     HPI:  Pt is a 88 y.o. female seen today for an acute visit for medication review following hospital stay.   Hospitalized 11/24/21-11/27/21 for acute CVA left PCA embolic strokes, CTA head/neck no significant stenosis of carotids, started Atorvastatin, on ASA 11/29/21, then Eliquis '5mg'$  bid, f/u Neurology  CKD, Bun/creat 29/1.23 11/26/21  Hypothyroidism, takes Levothyroxine, TSH 3.5 05/27/21             Hyponatremia, Na 135 11/26/21             Syncope: Orthostasis ?             A flutter, f/u Cardiology, off anticoagulation 2/2 falls in the past, restarted Eliquis after acute CVA             Hx of ovarian CA s/p TAH/BSO 03/2011, in remission.             Cognitive impairment, [facility] for supportive care.              Constipation takes prn Colace.               HTN, takes prn Hydralazine for Sbp >170, Dbp>90.             OP, takes Ca, Vit D             GERD, takes Omeprazole. Hgb 12.7 11/26/21             Insomnia, off Ambien.   Hyperlipidemia, on Atorvastatin, LDL 127 11/25/21             R ankle pain, uses ankle brace, takes Tylenol.   Urinary frequency, on Mirabegron Past Medical History:  Diagnosis Date   Anemia associated with acute blood loss 04/03/2011   Arthritis    knees    Ascites    Blood transfusion    hx of 6945    Complication of anesthesia    Sodium drops per pt    Cystitis    DIARRHEA, ANTIBIOTIC ASSOCIATED 05/31/2009   Qualifier: Diagnosis of  By: Tommy Medal MD, Cornelius     GERD (gastroesophageal reflux disease)    H/O hiatal hernia    Hyperlipidemia    Hypertension    Hyponatremia    Neutropenia with fever ([place]) 05/18/2011   OSTEOMYELITIS, CHRONIC, LOWER LEG 04/23/2009   Qualifier: Diagnosis of  By: Johnnye Sima  MD, Dellis Filbert     Osteopenia    OSTEOPOROSIS 04/23/2009   Qualifier: Diagnosis of  By: Johnnye Sima MD, Dellis Filbert     Ovarian cancer ([place]) 01/25/2011   Pneumonia    hx of    Postmenopausal atrophic vaginitis    PROSTHETIC JOINT COMPLICATION 1/60/7371   Qualifier: Diagnosis of  By: Tommy Medal MD, Cornelius     Renal disorder    Decreased kidney function   Staph infection 2010   after knee replacement   Urinary frequency    Vulvitis    Past Surgical History:  Procedure Laterality Date   ABDOMINAL HYSTERECTOMY  04/01/2011   Procedure: HYSTERECTOMY ABDOMINAL;  Surgeon: Alvino Chapel, MD;  Location: [facility];  Service: Gynecology;  Laterality: N/A;   APPENDECTOMY     JOINT REPLACEMENT     R knee in 2008, 5 operations on L knee   LAPAROTOMY  04/01/2011   Procedure: EXPLORATORY LAPAROTOMY;  Surgeon: Alvino Chapel, MD;  Location: [facility];  Service: Gynecology;  Laterality: N/A;   OTHER SURGICAL HISTORY     hx of C section 1966   SALPINGOOPHORECTOMY  04/01/2011   Procedure: SALPINGO OOPHERECTOMY;   Surgeon: Alvino Chapel, MD;  Location: [facility];  Service: Gynecology;  Laterality: Bilateral;   TONSILLECTOMY      Allergies  Allergen Reactions   Morphine And Related Anaphylaxis and Other (See Comments)    Given after knee replacement and code blue occurred   Morphine Sulfate Anaphylaxis   Penicillins Hives, Itching and Other (See Comments)    Syncope, also   Nsaids Other (See Comments)    Because of an abnormal kidney test result   Penicillin V Hives   Shellfish Allergy Nausea And Vomiting and Other (See Comments)    Pt has shellfish allergy only.  Has had IV contrast x 2 and did fine.   Shellfish-Derived Products Nausea And Vomiting    Allergies as of 11/29/2021       Reactions   Morphine And Related Anaphylaxis, Other (See Comments)   Given after knee replacement and code blue occurred   Morphine Sulfate Anaphylaxis   Penicillins Hives, Itching, Other (See Comments)   Syncope, also   Nsaids Other (See Comments)   Because of an abnormal kidney test result   Penicillin V Hives   Shellfish Allergy Nausea And Vomiting, Other (See Comments)   Pt has shellfish allergy only.  Has had IV contrast x 2 and did fine.   Shellfish-derived Products Nausea And Vomiting        Medication List        Accurate as of November 29, 2021 11:59 PM. If you have any questions, ask your nurse or doctor.          acetaminophen 500 MG tablet Commonly known as: TYLENOL Take 1,000 mg by mouth every 8 (eight) hours as needed.   apixaban 5 MG Tabs tablet Commonly known as: ELIQUIS Take 1 tablet (5 mg total) by mouth 2 (two) times daily.   aspirin EC 81 MG tablet Take 1 tablet (81 mg total) by mouth daily for 1 day. Swallow whole.   atorvastatin 40 MG tablet Commonly known as: LIPITOR Take 1 tablet (40 mg total) by mouth daily.   azelastine 0.1 % nasal spray Commonly known as: ASTELIN Place 1 spray into both nostrils daily as needed for rhinitis or allergies.    B-COMPLEX/FOLIC ACID/VITAMIN C PO Take 1 tablet by mouth daily with breakfast.   bimatoprost 0.03 %  ophthalmic solution Commonly known as: LUMIGAN Place 1 drop into both eyes at bedtime.   calcium carbonate 750 MG chewable tablet Commonly known as: TUMS EX Chew 750 mg by mouth daily as needed for heartburn.   cycloSPORINE 0.05 % ophthalmic emulsion Commonly known as: RESTASIS Place 1 drop into both eyes 2 (two) times daily.   diclofenac Sodium 1 % Gel Commonly known as: VOLTAREN Apply 2 g topically in the morning and at bedtime.   docusate sodium 100 MG capsule Commonly known as: COLACE Take 100 mg by mouth daily.   hydrALAZINE 10 MG tablet Commonly known as: APRESOLINE Take 10 mg by mouth 2 (two) times daily as needed.   levothyroxine 25 MCG tablet Commonly known as: SYNTHROID Take 25 mcg by mouth daily before breakfast.   meclizine 12.5 MG tablet Commonly known as: ANTIVERT Take 1 tablet (12.5 mg total) by mouth 2 (two) times daily as needed for dizziness.   melatonin 3 MG Tabs tablet Take 1 tablet (3 mg total) by mouth at bedtime.   miconazole 2 % powder Commonly known as: MICOTIN Apply topically daily. Patient may apply to dry skin under breasts daily and after showering   Myrbetriq 25 MG Tb24 tablet Generic drug: mirabegron ER Take 25 mg by mouth daily.   nystatin powder Commonly known as: MYCOSTATIN/NYSTOP Apply 1 application topically 2 (two) times daily as needed.   omeprazole 20 MG capsule Commonly known as: PRILOSEC Take 20 mg by mouth daily.   sodium chloride 1 g tablet Take 1 g by mouth 2 (two) times daily.   Vitamin D3 50 MCG (2000 UT) Tabs Take 2,000 Units by mouth in the morning.   zinc oxide 20 % ointment Apply 1 application topically as needed (for redness- to be applied to buttocks/peri area and after every incontinent episode).        Review of Systems  Constitutional:  Negative for activity change, appetite change, fatigue and  fever.  HENT:  Positive for hearing loss. Negative for congestion and trouble swallowing.   Eyes:  Negative for visual disturbance.  Respiratory:  Negative for cough and shortness of breath.   Cardiovascular:  Negative for leg swelling.  Gastrointestinal:  Negative for abdominal pain and constipation.  Genitourinary:  Positive for frequency. Negative for dysuria and urgency.  Musculoskeletal:  Positive for arthralgias, back pain and gait problem.       R ankle pain.   Skin:  Negative for color change.       Itching redness under R+L breast, in urogenital area.   Neurological:  Negative for speech difficulty, weakness and light-headedness.  Psychiatric/Behavioral:  Negative for confusion and sleep disturbance. The patient is not nervous/anxious.     Immunization History  Administered Date(s) Administered   Influenza Split 11/13/2012, 12/13/2012, 11/30/2013, 10/09/2014, 11/24/2018   Influenza, High Dose Seasonal PF 12/01/2013, 10/04/2014, 10/12/2015, 11/06/2016, 11/12/2017   Influenza,inj,Quad PF,6+ Mos 10/24/2010   Influenza-Unspecified 10/24/2010, 11/01/2019, 12/04/2020   Moderna SARS-COV2 Booster Vaccination 07/18/2020   Moderna Sars-Covid-2 Vaccination 02/14/2019, 03/14/2019, 12/26/2019   PFIZER(Purple Top)SARS-COV-2 Vaccination 10/31/2020   Pfizer Covid-19 Vaccine Bivalent Booster 71yr & up 10/31/2020   Pneumococcal Conjugate-13 09/28/2013   Pneumococcal Polysaccharide-23 03/05/2012   Td 08/27/2000   Tdap 08/27/2000, 10/16/2010   Zoster, Live 01/11/2013, 03/31/2020   Pertinent  Health Maintenance Due  Topic Date Due   INFLUENZA VACCINE  09/10/2021   DEXA SCAN  Completed      11/26/2021    9:00 AM 11/26/2021  9:80 AM 11/26/2021    7:40 PM 11/27/2021   10:24 AM 11/29/2021    2:37 PM  Fall Risk  Falls in the past year?     0  Was there an injury with Fall?     0  Fall Risk Category Calculator     0  Fall Risk Category     Low  Patient Fall Risk Level High fall risk  High fall risk High fall risk High fall risk Low fall risk  Patient at Risk for Falls Due to     No Fall Risks  Fall risk Follow up     Falls evaluation completed   Functional Status Survey:    Vitals:   11/29/21 1434  BP: (!) 150/76  Pulse: 76  Resp: 20  Temp: (!) 97.1 F (36.2 C)  SpO2: 97%  Weight: 202 lb 6.4 oz (91.8 kg)  Height: '5\' 3"'$  (1.6 m)   Body mass index is 35.85 kg/m. Physical Exam Vitals and nursing note reviewed.  Constitutional:      Appearance: Normal appearance.  HENT:     Head: Normocephalic and atraumatic.     Mouth/Throat:     Mouth: Mucous membranes are moist.  Eyes:     Extraocular Movements: Extraocular movements intact.     Conjunctiva/sclera: Conjunctivae normal.     Pupils: Pupils are equal, round, and reactive to light.  Cardiovascular:     Rate and Rhythm: Normal rate. Rhythm irregular.     Heart sounds: No murmur heard. Pulmonary:     Effort: Pulmonary effort is normal.     Breath sounds: No rales.  Abdominal:     General: Bowel sounds are normal.     Palpations: Abdomen is soft.     Tenderness: There is no abdominal tenderness.     Hernia: A hernia is present.     Comments: Incision hernia  Musculoskeletal:        General: Tenderness present.     Cervical back: Normal range of motion and neck supple.     Right lower leg: No edema.     Left lower leg: No edema.     Comments: Right ankle brace, chronic pain.  Skin:    General: Skin is warm and dry.     Findings: Erythema present.     Comments: Itching redness under R+L breast, in urogenital area.   Neurological:     General: No focal deficit present.     Mental Status: She is alert and oriented to person, place, and time. Mental status is at baseline.     Motor: No weakness.     Gait: Gait abnormal.  Psychiatric:        Mood and Affect: Mood normal.        Behavior: Behavior normal.        Thought Content: Thought content normal.     Labs reviewed: Recent Labs     05/27/21 1244 09/12/21 0000 11/24/21 1023 11/26/21 0420  NA 22* 139 135 135  K 4.3 4.4 3.9 3.7  CL 105 103 101 104  CO2 23* 26* 26 23  GLUCOSE  --   --  122* 109*  BUN 26* 27* 22 29*  CREATININE 1.2*  --  1.20* 1.23*  CALCIUM  --  9.5 9.5 8.7*   Recent Labs    05/27/21 0000 05/27/21 1244 11/24/21 1023  AST  --  12* 23  ALT  --  18 16  ALKPHOS  --  63  67  BILITOT  --   --  0.6  PROT  --   --  8.5*  ALBUMIN 3.4*  --  4.2   Recent Labs    05/27/21 1244 09/12/21 0000 11/24/21 1023 11/26/21 0420  WBC 5.6 6.5 8.2 8.9  NEUTROABS 3,097.00 4,381.00 6.6  --   HGB 11.6* 13.9 13.9 12.7  HCT 36 43 44.4 40.1  MCV  --   --  84.6 84.2  PLT 236 212 237 202   Lab Results  Component Value Date   TSH 3.50 03/28/2021   Lab Results  Component Value Date   HGBA1C 5.7 (H) 11/24/2021   Lab Results  Component Value Date   CHOL 181 11/25/2021   HDL 37 (L) 11/25/2021   LDLCALC 127 (H) 11/25/2021   TRIG 85 11/25/2021   CHOLHDL 4.9 11/25/2021    Significant Diagnostic Results in last 30 days:  US RENAL  Result Date: 11/24/2021 CLINICAL DATA:  Acute kidney injury EXAM: RENAL / URINARY TRACT ULTRASOUND COMPLETE COMPARISON:  None Available. FINDINGS: Right Kidney: Renal measurements: 7.9 x 3.8 x 4.1 cm = volume: 65 mL. Echogenicity within normal limits. No mass or hydronephrosis visualized. Left Kidney: Renal measurements: 8.7 x 4.7 x 4.4 cm = volume: 94 mL. Echogenicity within normal limits. No mass or hydronephrosis visualized. Bladder: Appears normal for degree of bladder distention. Other: None. IMPRESSION: Normal renal ultrasound. Electronically Signed   By: Ulyses Jarred M.D.   On: 11/24/2021 22:07   CT ANGIO HEAD NECK W WO CM  Result Date: 11/24/2021 CLINICAL DATA:  Stroke/TIA, determine embolic source.  Confusion. EXAM: CT ANGIOGRAPHY HEAD AND NECK TECHNIQUE: Multidetector CT imaging of the head and neck was performed using the standard protocol during bolus administration of  intravenous contrast. Multiplanar CT image reconstructions and MIPs were obtained to evaluate the vascular anatomy. Carotid stenosis measurements (when applicable) are obtained utilizing NASCET criteria, using the distal internal carotid diameter as the denominator. RADIATION DOSE REDUCTION: This exam was performed according to the departmental dose-optimization program which includes automated exposure control, adjustment of the mA and/or kV according to patient size and/or use of iterative reconstruction technique. CONTRAST:  9m OMNIPAQUE IOHEXOL 350 MG/ML SOLN COMPARISON:  Noncontrast head CT and brain MRI obtained earlier the same day FINDINGS: CTA NECK FINDINGS Aortic arch: There is mild calcified plaque in the imaged aortic arch. The origins of the major branch vessels are patent. The subclavian arteries are patent to the level imaged. Right carotid system: The right common carotid artery is patent. There is calcified plaque of the bifurcation resulting in less than 50% stenosis. Distal internal carotid artery is widely patent. The external carotid artery is patent. There is no dissection or aneurysm. Left carotid system: Left common, internal, and external carotid arteries are patent, with mild plaque at the bifurcation but no hemodynamically significant stenosis or occlusion. There is no dissection or aneurysm. Vertebral arteries: The vertebral arteries are patent, without hemodynamically significant stenosis or occlusion. There is no dissection or aneurysm. Skeleton: There is mild for age degenerative change of the cervical spine. There is no acute osseous abnormality or suspicious osseous lesion. There is no visible canal hematoma. Other neck: The soft tissues of the neck are unremarkable. Upper chest: The imaged lung apices are clear. Review of the MIP images confirms the above findings CTA HEAD FINDINGS Anterior circulation: There is calcified plaque in the intracranial ICAs without greater than mild  stenosis on either side. The bilateral MCAs are patent. There  is focal severe stenosis of a right M3 segment (8-134, 11-29). There is no other high-grade stenosis or occlusion. The right A1 segment is hypoplastic/absent, likely congenital. The left A1 segment is patent. Anterior communicating artery is normal. There is short-segment severe stenosis of the right A2 segment (7-289, 8-95). There is distal reconstitution after the stenosis. There is no high-grade stenosis or occlusion on the left. There is no aneurysm or AVM Posterior circulation: The bilateral V4 segments are patent with mild calcified plaque on the right. The basilar artery is patent. The major cerebellar arteries appear patent. The bilateral PCAs are patent, without proximal stenosis or occlusion. There is atherosclerotic irregularity of the distal branches. There is a fetal origin of the right PCA. There is no aneurysm or AVM. Venous sinuses: As permitted by contrast timing, patent. Anatomic variants: As above. Review of the MIP images confirms the above findings IMPRESSION: 1. No emergent large vessel occlusion. 2. Intracranial atherosclerotic disease resulting in focal high-grade stenosis of a right M3 segment and right A2 segment. Mild atherosclerotic irregularity elsewhere. 3. Calcified plaque at the carotid bifurcations, right worse than left, without hemodynamically significant stenosis or occlusion. Patent vertebral arteries in the neck. Electronically Signed   By: Valetta Mole M.D.   On: 11/24/2021 16:01   MR BRAIN WO CONTRAST  Result Date: 11/24/2021 CLINICAL DATA:  episode of AMS, dysarthria now resolved EXAM: MRI HEAD WITHOUT CONTRAST TECHNIQUE: Multiplanar, multiecho pulse sequences of the brain and surrounding structures were obtained without intravenous contrast. COMPARISON:  CT head from the same day. FINDINGS: Motion limited study. Brain: Punctate acute infarct in the high right posterior frontal lobe (series 5, images 34 and  28). Additional small acute cortical and subcortical infarcts in the left parietal lobe (for example series 5 image 28). No significant mass effect. Scattered T2/FLAIR hyperintensities limited, compatible chronic microvascular disease. Chronic 1 cm right parafalcine meningioma without significant mass effect. No evidence of acute hemorrhage, midline shift or hydrocephalus. Cerebral atrophy. Vascular: Major arterial flow voids are maintained skull base. Skull and upper cervical spine: Normal marrow signal. Sinuses/Orbits: No acute findings. Other: No mastoid effusions. IMPRESSION: 1. Multiple small acute infarcts in the left frontal and parietal lobes. 2. Chronic 1 cm right parafalcine meningioma without significant mass effect. Electronically Signed   By: Margaretha Sheffield M.D.   On: 11/24/2021 14:15   CT HEAD WO CONTRAST (5MM)  Result Date: 11/24/2021 CLINICAL DATA:  Altered mental status EXAM: CT HEAD WITHOUT CONTRAST TECHNIQUE: Contiguous axial images were obtained from the base of the skull through the vertex without intravenous contrast. RADIATION DOSE REDUCTION: This exam was performed according to the departmental dose-optimization program which includes automated exposure control, adjustment of the mA and/or kV according to patient size and/or use of iterative reconstruction technique. COMPARISON:  04/06/2020 FINDINGS: Brain: No acute intracranial findings are seen in noncontrast CT brain. There are no signs of bleeding within the cranium. Cortical sulci are prominent. Small old lacunar infarcts are seen in basal ganglia. There is decreased density in periventricular and subcortical white matter. There is a dense calcification, possibly calcified meningioma measuring 14 x 11 mm in the right parafalcine location in the posterior right parietal lobe with no significant change. There is no adjacent edema or mass effect. Vascular: Unremarkable. Skull: Unremarkable. Sinuses/Orbits: There is almost complete  opacification of left side of sphenoid sinus with no significant interval change. Other: None. IMPRESSION: No acute intracranial findings are seen in noncontrast CT brain. Atrophy.  Small-vessel disease. Stable  calcified meningioma in right parafalcine location. Chronic sphenoid sinusitis. Electronically Signed   By: Elmer Picker M.D.   On: 11/24/2021 10:08    Assessment/Plan: Acute CVA (cerebrovascular accident) [facility]) Hospitalized 11/24/21-11/27/21 for acute CVA left PCA embolic strokes, CTA head/neck no significant stenosis of carotids, started Atorvastatin, on ASA 11/29/21, then Eliquis '5mg'$  bid, f/u Neurology.  Continue thearpy  CKD (chronic kidney disease) stage 3, GFR 30-59 ml/min (HCC) Bun/creat 29/1.23 11/26/21  Hypothyroidism  takes Levothyroxine, TSH 3.5 05/27/21  Hyponatremia  Na 135 11/26/21  Syncope and collapse  Orthostasis? Better.  Atrial flutter ([place]) f/u Cardiology, off anticoagulation 2/2 falls in the past, restarted Eliquis after acute CVA  Ovarian cancer ([place]) Hx of ovarian CA s/p TAH/BSO 03/2011, in remission.  Mixed Alzheimer's and vascular dementia ([place]) [facility] for supportive care.   Slow transit constipation Stable,  takes prn Colace.   Essential hypertension Labile Bp, takes prn Hydralazine for Sbp >170, Dbp>90.  Osteoporosis  takes Ca, Vit D  GERD Stable, takes Omeprazole. Hgb 12.7 11/26/21  Insomnia off Ambien.   HLD (hyperlipidemia)  on Atorvastatin, LDL 127 11/25/21  Urinary frequency No change,  on Mirabegron    Family/ staff Communication: plan of care reviewed with the patient and charge nurse.   Labs/tests ordered:  none  Time spend 35 minutes.

## 2021-11-29 NOTE — Assessment & Plan Note (Signed)
on Atorvastatin, LDL 127 11/25/21 

## 2021-11-29 NOTE — Assessment & Plan Note (Signed)
f/u Cardiology, off anticoagulation 2/2 falls in the past, restarted Eliquis after acute CVA

## 2021-11-29 NOTE — Assessment & Plan Note (Addendum)
Orthostasis? Better.

## 2021-11-29 NOTE — Assessment & Plan Note (Signed)
off Ambien.  

## 2021-11-29 NOTE — Assessment & Plan Note (Signed)
Stable,  takes prn Colace.

## 2021-11-29 NOTE — Assessment & Plan Note (Signed)
Hx of ovarian CA s/p TAH/BSO 03/2011, in remission. 

## 2021-11-29 NOTE — Assessment & Plan Note (Signed)
Labile Bp, takes prn Hydralazine for Sbp >170, Dbp>90.

## 2021-11-29 NOTE — Assessment & Plan Note (Signed)
Na 135 11/26/21 

## 2021-11-29 NOTE — Assessment & Plan Note (Signed)
Bun/creat 29/1.23 11/26/21 

## 2021-11-29 NOTE — Assessment & Plan Note (Signed)
AL [facility] for supportive care.

## 2021-11-29 NOTE — Progress Notes (Signed)
This encounter was created in error - please disregard.

## 2021-11-29 NOTE — Assessment & Plan Note (Signed)
Stable, takes Omeprazole. Hgb 12.7 11/26/21 

## 2021-11-29 NOTE — Assessment & Plan Note (Signed)
takes Ca, Vit D 

## 2021-11-29 NOTE — Assessment & Plan Note (Signed)
Hospitalized 11/24/21-11/27/21 for acute CVA left PCA embolic strokes, CTA head/neck no significant stenosis of carotids, started Atorvastatin, on ASA 11/29/21, then Eliquis '5mg'$  bid, f/u Neurology.  Continue thearpy

## 2021-11-29 NOTE — Assessment & Plan Note (Signed)
takes Levothyroxine, TSH 3.5 05/27/21 

## 2021-12-02 ENCOUNTER — Encounter: Payer: Self-pay | Admitting: Nurse Practitioner

## 2021-12-02 NOTE — Telephone Encounter (Signed)
Patient is now discharged back to [facility].

## 2021-12-03 ENCOUNTER — Non-Acute Institutional Stay (SKILLED_NURSING_FACILITY): Payer: Medicare Other | Admitting: Family Medicine

## 2021-12-03 DIAGNOSIS — I10 Essential (primary) hypertension: Secondary | ICD-10-CM | POA: Diagnosis not present

## 2021-12-03 DIAGNOSIS — F0280 Dementia in other diseases classified elsewhere without behavioral disturbance: Secondary | ICD-10-CM

## 2021-12-03 DIAGNOSIS — I639 Cerebral infarction, unspecified: Secondary | ICD-10-CM | POA: Diagnosis not present

## 2021-12-03 DIAGNOSIS — I4892 Unspecified atrial flutter: Secondary | ICD-10-CM

## 2021-12-03 DIAGNOSIS — F0150 Vascular dementia without behavioral disturbance: Secondary | ICD-10-CM

## 2021-12-03 DIAGNOSIS — G309 Alzheimer's disease, unspecified: Secondary | ICD-10-CM | POA: Diagnosis not present

## 2021-12-03 NOTE — Progress Notes (Signed)
Provider:  Alain Honey, MD Location:      Place of Service:     PCP: Virgie Dad, MD Patient Care Team: Virgie Dad, MD as PCP - General (Internal Medicine) Donato Heinz, MD as PCP - Cardiology (Cardiology) Brien Few, MD as Consulting Physician (Obstetrics and Gynecology) Marti Sleigh, MD as Consulting Physician (Gynecology) Nancy Marus, MD as Consulting Physician (Gynecologic Oncology)  Extended Emergency Contact Information Primary Emergency Contact: [facility] Address: [address]          [place], [place] [address] [name] of [place] Phone: [phone] Mobile Phone: [phone] Relation: Daughter  Code Status:  Goals of Care: Advanced Directive information    11/29/2021    2:37 PM  Advanced Directives  Does Patient Have a Medical Advance Directive? Yes  Type of Advance Directive [place] in Chart? Yes - validated most recent copy scanned in chart (See row information)      No chief complaint on file.   HPI: Patient is a 88 y.o. female seen today for admission to [facility] after hospitalization from October 15 to October 18 for rehab. He presented to the hospital on the date of admission with symptoms of confusion and slurred speech and found to have acute CVA on brain MRI.  Has history of atrial fibrillation but not on anticoagulation.  She was initially started on aspirin 81 mg but plan of was for her to transition to Eliquis. At the time of admission to her skilled nursing she is asymptomatic and per her, feels like she does not need therapy.  She had been a resident in assisted living at [facility] when asked.  Past Medical History:  Diagnosis Date   Anemia associated with acute blood loss 04/03/2011   Arthritis    knees    Ascites    Blood transfusion    hx of 5329    Complication of anesthesia    Sodium drops per pt     Cystitis    DIARRHEA, ANTIBIOTIC ASSOCIATED 05/31/2009   Qualifier: Diagnosis of  By: Tommy Medal MD, Cornelius     GERD (gastroesophageal reflux disease)    H/O hiatal hernia    Hyperlipidemia    Hypertension    Hyponatremia    Neutropenia with fever ([place]) 05/18/2011   OSTEOMYELITIS, CHRONIC, LOWER LEG 04/23/2009   Qualifier: Diagnosis of  By: Johnnye Sima MD, Felecia Shelling    OSTEOPOROSIS 04/23/2009   Qualifier: Diagnosis of  By: Johnnye Sima MD, Jeffrey     Ovarian cancer ([place]) 01/25/2011   Pneumonia    hx of    Postmenopausal atrophic vaginitis    PROSTHETIC JOINT COMPLICATION 11/03/2681   Qualifier: Diagnosis of  By: Tommy Medal MD, Cornelius     Renal disorder    Decreased kidney function   Staph infection 2010   after knee replacement   Urinary frequency    Vulvitis    Past Surgical History:  Procedure Laterality Date   ABDOMINAL HYSTERECTOMY  04/01/2011   Procedure: HYSTERECTOMY ABDOMINAL;  Surgeon: Alvino Chapel, MD;  Location: [facility];  Service: Gynecology;  Laterality: N/A;   APPENDECTOMY     JOINT REPLACEMENT     R knee in 2008, 5 operations on L knee   LAPAROTOMY  04/01/2011   Procedure: EXPLORATORY LAPAROTOMY;  Surgeon: Alvino Chapel, MD;  Location: [facility];  Service: Gynecology;  Laterality: N/A;   OTHER SURGICAL  HISTORY     hx of C section 1966   SALPINGOOPHORECTOMY  04/01/2011   Procedure: SALPINGO OOPHERECTOMY;  Surgeon: Alvino Chapel, MD;  Location: [facility];  Service: Gynecology;  Laterality: Bilateral;   TONSILLECTOMY      reports that she quit smoking about 69 years ago. Her smoking use included cigarettes. She has never used smokeless tobacco. She reports that she does not currently use alcohol. She reports that she does not use drugs. Social History   Socioeconomic History   Marital status: Widowed    Spouse name: Not on file   Number of children: Not on file   Years of education: Not on file   Highest education level: Not on file   Occupational History   Not on file  Tobacco Use   Smoking status: Former    Types: Cigarettes    Quit date: 02/11/1952    Years since quitting: 69.8   Smokeless tobacco: Never  Vaping Use   Vaping Use: Never used  Substance and Sexual Activity   Alcohol use: Not Currently   Drug use: Never   Sexual activity: Never  Other Topics Concern   Not on file  Social History Narrative   Not on file   Social Determinants of Health   Financial Resource Strain: Low Risk  (08/23/2021)   Overall Financial Resource Strain (CARDIA)    Difficulty of Paying Living Expenses: Not hard at all  Food Insecurity: No Food Insecurity (11/26/2021)   Hunger Vital Sign    Worried About Running Out of Food in the Last Year: Never true    [place] in the Last Year: Never true  Transportation Needs: No Transportation Needs (11/26/2021)   PRAPARE - Hydrologist (Medical): No    Lack of Transportation (Non-Medical): No  Physical Activity: Insufficiently Active (08/23/2021)   Exercise Vital Sign    Days of Exercise per Week: 7 days    Minutes of Exercise per Session: 10 min  Stress: No Stress Concern Present (08/16/2020)   [place]    Feeling of Stress : Only a little  Social Connections: Socially Isolated (08/23/2021)   Social Connection and Isolation Panel [NHANES]    Frequency of Communication with Friends and Family: More than three times a week    Frequency of Social Gatherings with Friends and Family: Three times a week    Attends Religious Services: Never    Active Member of Clubs or Organizations: No    Attends Archivist Meetings: Never    Marital Status: Widowed  Intimate Partner Violence: Not At Risk (11/26/2021)   Humiliation, Afraid, Rape, and Kick questionnaire    Fear of Current or Ex-Partner: No    Emotionally Abused: No    Physically Abused: No    Sexually Abused: No     Functional Status Survey:    Family History  Problem Relation Age of Onset   Cancer Other        Bladder cancer   Heart attack Brother    Diabetes Brother     Health Maintenance  Topic Date Due   Medicare Annual Wellness (AWV)  Never done   TETANUS/TDAP  10/15/2020   COVID-19 Vaccine (5 - Moderna risk series) 12/26/2020   INFLUENZA VACCINE  09/10/2021   Pneumonia Vaccine 29+ Years old  Completed   DEXA SCAN  Completed   HPV VACCINES  Aged Out   Zoster Vaccines-  Shingrix  Discontinued    Allergies  Allergen Reactions   Morphine And Related Anaphylaxis and Other (See Comments)    Given after knee replacement and code blue occurred   Morphine Sulfate Anaphylaxis   Penicillins Hives, Itching and Other (See Comments)    Syncope, also   Nsaids Other (See Comments)    Because of an abnormal kidney test result   Penicillin V Hives   Shellfish Allergy Nausea And Vomiting and Other (See Comments)    Pt has shellfish allergy only.  Has had IV contrast x 2 and did fine.   Shellfish-Derived Products Nausea And Vomiting    Outpatient Encounter Medications as of 12/03/2021  Medication Sig   acetaminophen (TYLENOL) 500 MG tablet Take 1,000 mg by mouth every 8 (eight) hours as needed.   apixaban (ELIQUIS) 5 MG TABS tablet Take 1 tablet (5 mg total) by mouth 2 (two) times daily.   atorvastatin (LIPITOR) 40 MG tablet Take 1 tablet (40 mg total) by mouth daily.   azelastine (ASTELIN) 0.1 % nasal spray Place 1 spray into both nostrils daily as needed for rhinitis or allergies.   B Complex-C-Folic Acid (B-COMPLEX/FOLIC ACID/VITAMIN C PO) Take 1 tablet by mouth daily with breakfast.   bimatoprost (LUMIGAN) 0.03 % ophthalmic solution Place 1 drop into both eyes at bedtime.   calcium carbonate (TUMS EX) 750 MG chewable tablet Chew 750 mg by mouth daily as needed for heartburn.   Cholecalciferol (VITAMIN D3) 50 MCG (2000 UT) TABS Take 2,000 Units by mouth in the morning.   cycloSPORINE  (RESTASIS) 0.05 % ophthalmic emulsion Place 1 drop into both eyes 2 (two) times daily.   diclofenac Sodium (VOLTAREN) 1 % GEL Apply 2 g topically in the morning and at bedtime.   docusate sodium (COLACE) 100 MG capsule Take 100 mg by mouth daily.   hydrALAZINE (APRESOLINE) 10 MG tablet Take 10 mg by mouth 2 (two) times daily as needed.   levothyroxine (SYNTHROID) 25 MCG tablet Take 25 mcg by mouth daily before breakfast.   meclizine (ANTIVERT) 12.5 MG tablet Take 1 tablet (12.5 mg total) by mouth 2 (two) times daily as needed for dizziness.   melatonin 3 MG TABS tablet Take 1 tablet (3 mg total) by mouth at bedtime.   miconazole (MICOTIN) 2 % powder Apply topically daily. Patient may apply to dry skin under breasts daily and after showering   mirabegron ER (MYRBETRIQ) 25 MG TB24 tablet Take 25 mg by mouth daily.   nystatin (MYCOSTATIN/NYSTOP) powder Apply 1 application topically 2 (two) times daily as needed.   omeprazole (PRILOSEC) 20 MG capsule Take 20 mg by mouth daily.   sodium chloride 1 g tablet Take 1 g by mouth 2 (two) times daily.   zinc oxide 20 % ointment Apply 1 application topically as needed (for redness- to be applied to buttocks/peri area and after every incontinent episode).   No facility-administered encounter medications on file as of 12/03/2021.    Review of Systems  Constitutional: Negative.   HENT:  Positive for hearing loss.   Eyes: Negative.   Respiratory: Negative.    Cardiovascular: Negative.   Gastrointestinal: Negative.   Endocrine: Negative.   Genitourinary: Negative.   Musculoskeletal:  Positive for gait problem.  Skin: Negative.   Allergic/Immunologic: Negative.   Neurological:  Positive for weakness.  Psychiatric/Behavioral:  Positive for confusion.   All other systems reviewed and are negative.   There were no vitals filed for this visit. There is no height  or weight on file to calculate BMI. Physical Exam Nursing note reviewed.  Constitutional:       Appearance: Normal appearance.  HENT:     Head: Normocephalic.     Mouth/Throat:     Mouth: Mucous membranes are moist.     Pharynx: Oropharynx is clear.  Eyes:     Extraocular Movements: Extraocular movements intact.     Pupils: Pupils are equal, round, and reactive to light.  Cardiovascular:     Rate and Rhythm: Normal rate and regular rhythm.     Heart sounds: Normal heart sounds.  Pulmonary:     Effort: Pulmonary effort is normal.     Breath sounds: Normal breath sounds.  Abdominal:     General: Bowel sounds are normal.     Palpations: Abdomen is soft.  Musculoskeletal:     Comments: Has used walker for ambulation chronically  Skin:    General: Skin is warm and dry.  Neurological:     General: No focal deficit present.     Mental Status: She is alert and oriented to person, place, and time.  Psychiatric:        Mood and Affect: Mood normal.        Behavior: Behavior normal.        Thought Content: Thought content normal.     Labs reviewed: Basic Metabolic Panel: Recent Labs    05/27/21 1244 09/12/21 0000 11/24/21 1023 11/26/21 0420  NA 22* 139 135 135  K 4.3 4.4 3.9 3.7  CL 105 103 101 104  CO2 23* 26* 26 23  GLUCOSE  --   --  122* 109*  BUN 26* 27* 22 29*  CREATININE 1.2*  --  1.20* 1.23*  CALCIUM  --  9.5 9.5 8.7*   Liver Function Tests: Recent Labs    05/27/21 0000 05/27/21 1244 11/24/21 1023  AST  --  12* 23  ALT  --  18 16  ALKPHOS  --  63 67  BILITOT  --   --  0.6  PROT  --   --  8.5*  ALBUMIN 3.4*  --  4.2   No results for input(s): "LIPASE", "AMYLASE" in the last 8760 hours. No results for input(s): "AMMONIA" in the last 8760 hours. CBC: Recent Labs    05/27/21 1244 09/12/21 0000 11/24/21 1023 11/26/21 0420  WBC 5.6 6.5 8.2 8.9  NEUTROABS 3,097.00 4,381.00 6.6  --   HGB 11.6* 13.9 13.9 12.7  HCT 36 43 44.4 40.1  MCV  --   --  84.6 84.2  PLT 236 212 237 202   Cardiac Enzymes: No results for input(s): "CKTOTAL", "CKMB",  "CKMBINDEX", "TROPONINI" in the last 8760 hours. BNP: Invalid input(s): "POCBNP" Lab Results  Component Value Date   HGBA1C 5.7 (H) 11/24/2021   Lab Results  Component Value Date   TSH 3.50 03/28/2021   No results found for: "VITAMINB12" No results found for: "FOLATE" Lab Results  Component Value Date   IRON 74 05/27/2011   TIBC 299 05/27/2011   FERRITIN 498 (H) 05/27/2011    Imaging and Procedures obtained prior to SNF admission: US RENAL  Result Date: 11/24/2021 CLINICAL DATA:  Acute kidney injury EXAM: RENAL / URINARY TRACT ULTRASOUND COMPLETE COMPARISON:  None Available. FINDINGS: Right Kidney: Renal measurements: 7.9 x 3.8 x 4.1 cm = volume: 65 mL. Echogenicity within normal limits. No mass or hydronephrosis visualized. Left Kidney: Renal measurements: 8.7 x 4.7 x 4.4 cm = volume: 94 mL. Echogenicity within  normal limits. No mass or hydronephrosis visualized. Bladder: Appears normal for degree of bladder distention. Other: None. IMPRESSION: Normal renal ultrasound. Electronically Signed   By: Ulyses Jarred M.D.   On: 11/24/2021 22:07   CT ANGIO HEAD NECK W WO CM  Result Date: 11/24/2021 CLINICAL DATA:  Stroke/TIA, determine embolic source.  Confusion. EXAM: CT ANGIOGRAPHY HEAD AND NECK TECHNIQUE: Multidetector CT imaging of the head and neck was performed using the standard protocol during bolus administration of intravenous contrast. Multiplanar CT image reconstructions and MIPs were obtained to evaluate the vascular anatomy. Carotid stenosis measurements (when applicable) are obtained utilizing NASCET criteria, using the distal internal carotid diameter as the denominator. RADIATION DOSE REDUCTION: This exam was performed according to the departmental dose-optimization program which includes automated exposure control, adjustment of the mA and/or kV according to patient size and/or use of iterative reconstruction technique. CONTRAST:  11m OMNIPAQUE IOHEXOL 350 MG/ML SOLN  COMPARISON:  Noncontrast head CT and brain MRI obtained earlier the same day FINDINGS: CTA NECK FINDINGS Aortic arch: There is mild calcified plaque in the imaged aortic arch. The origins of the major branch vessels are patent. The subclavian arteries are patent to the level imaged. Right carotid system: The right common carotid artery is patent. There is calcified plaque of the bifurcation resulting in less than 50% stenosis. Distal internal carotid artery is widely patent. The external carotid artery is patent. There is no dissection or aneurysm. Left carotid system: Left common, internal, and external carotid arteries are patent, with mild plaque at the bifurcation but no hemodynamically significant stenosis or occlusion. There is no dissection or aneurysm. Vertebral arteries: The vertebral arteries are patent, without hemodynamically significant stenosis or occlusion. There is no dissection or aneurysm. Skeleton: There is mild for age degenerative change of the cervical spine. There is no acute osseous abnormality or suspicious osseous lesion. There is no visible canal hematoma. Other neck: The soft tissues of the neck are unremarkable. Upper chest: The imaged lung apices are clear. Review of the MIP images confirms the above findings CTA HEAD FINDINGS Anterior circulation: There is calcified plaque in the intracranial ICAs without greater than mild stenosis on either side. The bilateral MCAs are patent. There is focal severe stenosis of a right M3 segment (8-134, 11-29). There is no other high-grade stenosis or occlusion. The right A1 segment is hypoplastic/absent, likely congenital. The left A1 segment is patent. Anterior communicating artery is normal. There is short-segment severe stenosis of the right A2 segment (7-289, 8-95). There is distal reconstitution after the stenosis. There is no high-grade stenosis or occlusion on the left. There is no aneurysm or AVM Posterior circulation: The bilateral V4  segments are patent with mild calcified plaque on the right. The basilar artery is patent. The major cerebellar arteries appear patent. The bilateral PCAs are patent, without proximal stenosis or occlusion. There is atherosclerotic irregularity of the distal branches. There is a fetal origin of the right PCA. There is no aneurysm or AVM. Venous sinuses: As permitted by contrast timing, patent. Anatomic variants: As above. Review of the MIP images confirms the above findings IMPRESSION: 1. No emergent large vessel occlusion. 2. Intracranial atherosclerotic disease resulting in focal high-grade stenosis of a right M3 segment and right A2 segment. Mild atherosclerotic irregularity elsewhere. 3. Calcified plaque at the carotid bifurcations, right worse than left, without hemodynamically significant stenosis or occlusion. Patent vertebral arteries in the neck. Electronically Signed   By: PValetta MoleM.D.   On: 11/24/2021 16:01  MR BRAIN WO CONTRAST  Result Date: 11/24/2021 CLINICAL DATA:  episode of AMS, dysarthria now resolved EXAM: MRI HEAD WITHOUT CONTRAST TECHNIQUE: Multiplanar, multiecho pulse sequences of the brain and surrounding structures were obtained without intravenous contrast. COMPARISON:  CT head from the same day. FINDINGS: Motion limited study. Brain: Punctate acute infarct in the high right posterior frontal lobe (series 5, images 34 and 28). Additional small acute cortical and subcortical infarcts in the left parietal lobe (for example series 5 image 28). No significant mass effect. Scattered T2/FLAIR hyperintensities limited, compatible chronic microvascular disease. Chronic 1 cm right parafalcine meningioma without significant mass effect. No evidence of acute hemorrhage, midline shift or hydrocephalus. Cerebral atrophy. Vascular: Major arterial flow voids are maintained skull base. Skull and upper cervical spine: Normal marrow signal. Sinuses/Orbits: No acute findings. Other: No mastoid  effusions. IMPRESSION: 1. Multiple small acute infarcts in the left frontal and parietal lobes. 2. Chronic 1 cm right parafalcine meningioma without significant mass effect. Electronically Signed   By: Margaretha Sheffield M.D.   On: 11/24/2021 14:15   CT HEAD WO CONTRAST (5MM)  Result Date: 11/24/2021 CLINICAL DATA:  Altered mental status EXAM: CT HEAD WITHOUT CONTRAST TECHNIQUE: Contiguous axial images were obtained from the base of the skull through the vertex without intravenous contrast. RADIATION DOSE REDUCTION: This exam was performed according to the departmental dose-optimization program which includes automated exposure control, adjustment of the mA and/or kV according to patient size and/or use of iterative reconstruction technique. COMPARISON:  04/06/2020 FINDINGS: Brain: No acute intracranial findings are seen in noncontrast CT brain. There are no signs of bleeding within the cranium. Cortical sulci are prominent. Small old lacunar infarcts are seen in basal ganglia. There is decreased density in periventricular and subcortical white matter. There is a dense calcification, possibly calcified meningioma measuring 14 x 11 mm in the right parafalcine location in the posterior right parietal lobe with no significant change. There is no adjacent edema or mass effect. Vascular: Unremarkable. Skull: Unremarkable. Sinuses/Orbits: There is almost complete opacification of left side of sphenoid sinus with no significant interval change. Other: None. IMPRESSION: No acute intracranial findings are seen in noncontrast CT brain. Atrophy.  Small-vessel disease. Stable calcified meningioma in right parafalcine location. Chronic sphenoid sinusitis. Electronically Signed   By: Elmer Picker M.D.   On: 11/24/2021 10:08    Assessment/Plan 1. Acute CVA (cerebrovascular accident) ([place]) Symptoms have resolved and there does not seem to be sequelae  2. Atrial flutter, unspecified type ([place]) Apparently she has  not been on any rate controlling drug but as noted above Eliquis will be started  3. Essential hypertension Blood pressures have been controlled with hydralazine.  In consultation with patient's daughter we will continue.  Labile nature has been noted  4. Mixed Alzheimer's and vascular dementia ([place]) Question this diagnosis.  I have known this patient for some years charge and she remembered my name when I entered her room today.  There are some memory issues but I think it is probably more related to her age than Alzheimer's    Family/ staff Communication:   Labs/tests ordered: Per NP  Lillette Boxer. Sabra Heck, [place] [address] [place], [place] Office [phone]

## 2021-12-10 ENCOUNTER — Encounter: Payer: Self-pay | Admitting: Nurse Practitioner

## 2021-12-10 ENCOUNTER — Non-Acute Institutional Stay (SKILLED_NURSING_FACILITY): Payer: Medicare Other | Admitting: Nurse Practitioner

## 2021-12-10 DIAGNOSIS — C569 Malignant neoplasm of unspecified ovary: Secondary | ICD-10-CM

## 2021-12-10 DIAGNOSIS — R55 Syncope and collapse: Secondary | ICD-10-CM

## 2021-12-10 DIAGNOSIS — E871 Hypo-osmolality and hyponatremia: Secondary | ICD-10-CM

## 2021-12-10 DIAGNOSIS — R350 Frequency of micturition: Secondary | ICD-10-CM | POA: Diagnosis not present

## 2021-12-10 DIAGNOSIS — K219 Gastro-esophageal reflux disease without esophagitis: Secondary | ICD-10-CM

## 2021-12-10 DIAGNOSIS — I10 Essential (primary) hypertension: Secondary | ICD-10-CM

## 2021-12-10 DIAGNOSIS — I639 Cerebral infarction, unspecified: Secondary | ICD-10-CM

## 2021-12-10 DIAGNOSIS — M159 Polyosteoarthritis, unspecified: Secondary | ICD-10-CM

## 2021-12-10 DIAGNOSIS — G309 Alzheimer's disease, unspecified: Secondary | ICD-10-CM

## 2021-12-10 DIAGNOSIS — F5101 Primary insomnia: Secondary | ICD-10-CM | POA: Diagnosis not present

## 2021-12-10 DIAGNOSIS — I4892 Unspecified atrial flutter: Secondary | ICD-10-CM

## 2021-12-10 DIAGNOSIS — E785 Hyperlipidemia, unspecified: Secondary | ICD-10-CM

## 2021-12-10 DIAGNOSIS — K5901 Slow transit constipation: Secondary | ICD-10-CM

## 2021-12-10 DIAGNOSIS — F0280 Dementia in other diseases classified elsewhere without behavioral disturbance: Secondary | ICD-10-CM

## 2021-12-10 DIAGNOSIS — N1831 Chronic kidney disease, stage 3a: Secondary | ICD-10-CM

## 2021-12-10 DIAGNOSIS — E039 Hypothyroidism, unspecified: Secondary | ICD-10-CM

## 2021-12-10 DIAGNOSIS — F0150 Vascular dementia without behavioral disturbance: Secondary | ICD-10-CM

## 2021-12-10 DIAGNOSIS — M810 Age-related osteoporosis without current pathological fracture: Secondary | ICD-10-CM

## 2021-12-10 NOTE — Assessment & Plan Note (Signed)
Na 135 11/26/21

## 2021-12-10 NOTE — Assessment & Plan Note (Signed)
takes Omeprazole. Hgb 12.7 11/26/21

## 2021-12-10 NOTE — Assessment & Plan Note (Signed)
Heart rate is controlled, f/u Cardiology, off anticoagulation 2/2 falls in the past, restarted Eliquis after acute CVA 11/24/21

## 2021-12-10 NOTE — Assessment & Plan Note (Signed)
on Atorvastatin, LDL 127 11/25/21 

## 2021-12-10 NOTE — Assessment & Plan Note (Signed)
takes prn Hydralazine for Sbp >170, Dbp>90.

## 2021-12-10 NOTE — Assessment & Plan Note (Signed)
Hx of ovarian CA s/p TAH/BSO 03/2011, in remission.

## 2021-12-10 NOTE — Assessment & Plan Note (Signed)
Orthostasis ?, no recurrence since last seen.

## 2021-12-10 NOTE — Assessment & Plan Note (Signed)
takes Levothyroxine, TSH 3.5 05/27/21

## 2021-12-10 NOTE — Assessment & Plan Note (Signed)
off Ambien.

## 2021-12-10 NOTE — Assessment & Plan Note (Signed)
At her baseline,  on Mirabegron

## 2021-12-10 NOTE — Assessment & Plan Note (Signed)
Bun/creat 29/1.23 11/26/21

## 2021-12-10 NOTE — Assessment & Plan Note (Signed)
R ankle pain, uses ankle brace, takes Tylenol.

## 2021-12-10 NOTE — Assessment & Plan Note (Signed)
goal if to return to [facility] for supportive care.

## 2021-12-10 NOTE — Assessment & Plan Note (Signed)
takes Ca, Vit D

## 2021-12-10 NOTE — Assessment & Plan Note (Signed)
Hospitalized 11/24/21-11/27/21 for acute CVA left PCA embolic strokes, no apparent residual symptoms, on Eliquis f/u Neurology

## 2021-12-10 NOTE — Progress Notes (Unsigned)
Location:  [facility] Room Number: NO/43/A Place of Service:  SNF (31) Provider:  Dene Nazir X, NP Virgie Dad, MD  Patient Care Team: Virgie Dad, MD as PCP - General (Internal Medicine) Donato Heinz, MD as PCP - Cardiology (Cardiology) Brien Few, MD as Consulting Physician (Obstetrics and Gynecology) Marti Sleigh, MD as Consulting Physician (Gynecology) Nancy Marus, MD as Consulting Physician (Gynecologic Oncology)  Extended Emergency Contact Information Primary Emergency Contact: [facility] Address: [address]          [place], [place] [address] [name] of [place] Phone: [phone] Mobile Phone: [phone] Relation: Daughter  Code Status:  FULL Goals of care: Advanced Directive information    12/10/2021    9:15 AM  Advanced Directives  Does Patient Have a Medical Advance Directive? Yes  Type of Advance Directive [place]  Does patient want to make changes to medical advance directive? No - Patient declined  Copy of [place] in Chart? Yes - validated most recent copy scanned in chart (See row information)     Chief Complaint  Patient presents with   Medical Management of Chronic Issues    Patient is here for a follow up for chronic conditions    Quality Metric Gaps    Discuss need for updated vaccines: Tdap, covid booster, and annual flu    HPI:  Pt is a 88 y.o. female seen today for medical management of chronic diseases.     Hospitalized 11/24/21-11/27/21 for acute CVA left PCA embolic strokes, no apparent residual symptoms, on Eliquis f/u Neurology             CKD, Bun/creat 29/1.23 11/26/21             Hypothyroidism, takes Levothyroxine, TSH 3.5 05/27/21             Hyponatremia, Na 135 11/26/21             Syncope: Orthostasis ?             A flutter, f/u Cardiology, off anticoagulation 2/2 falls in the past, restarted Eliquis after acute  CVA 11/24/21             Hx of ovarian CA s/p TAH/BSO 03/2011, in remission.             vascular dementia,  goal if to return to AL for supportive care.              Constipation takes Colace.              HTN, takes prn Hydralazine for Sbp >170, Dbp>90.             OP, takes Ca, Vit D             GERD, takes Omeprazole. Hgb 12.7 11/26/21             Insomnia, off Ambien.              Hyperlipidemia, on Atorvastatin, LDL 127 11/25/21             OA, R ankle pain, uses ankle brace, takes Tylenol.              Urinary frequency, on Mirabegron Past Medical History:  Diagnosis Date   Anemia associated with acute blood loss 04/03/2011   Arthritis    knees    Ascites    Blood transfusion    hx of 2011  Complication of anesthesia    Sodium drops per pt    Cystitis    DIARRHEA, ANTIBIOTIC ASSOCIATED 05/31/2009   Qualifier: Diagnosis of  By: Tommy Medal MD, Roderic Scarce     GERD (gastroesophageal reflux disease)    H/O hiatal hernia    Hyperlipidemia    Hypertension    Hyponatremia    Neutropenia with fever ([place]) 05/18/2011   OSTEOMYELITIS, CHRONIC, LOWER LEG 04/23/2009   Qualifier: Diagnosis of  By: Johnnye Sima MD, Felecia Shelling    OSTEOPOROSIS 04/23/2009   Qualifier: Diagnosis of  By: Johnnye Sima MD, Jeffrey     Ovarian cancer ([place]) 01/25/2011   Pneumonia    hx of    Postmenopausal atrophic vaginitis    PROSTHETIC JOINT COMPLICATION 05/03/4008   Qualifier: Diagnosis of  By: Tommy Medal MD, Cornelius     Renal disorder    Decreased kidney function   Staph infection 2010   after knee replacement   Urinary frequency    Vulvitis    Past Surgical History:  Procedure Laterality Date   ABDOMINAL HYSTERECTOMY  04/01/2011   Procedure: HYSTERECTOMY ABDOMINAL;  Surgeon: Alvino Chapel, MD;  Location: [facility];  Service: Gynecology;  Laterality: N/A;   APPENDECTOMY     JOINT REPLACEMENT     R knee in 2008, 5 operations on L knee   LAPAROTOMY  04/01/2011   Procedure: EXPLORATORY LAPAROTOMY;   Surgeon: Alvino Chapel, MD;  Location: [facility];  Service: Gynecology;  Laterality: N/A;   OTHER SURGICAL HISTORY     hx of C section 1966   SALPINGOOPHORECTOMY  04/01/2011   Procedure: SALPINGO OOPHERECTOMY;  Surgeon: Alvino Chapel, MD;  Location: [facility];  Service: Gynecology;  Laterality: Bilateral;   TONSILLECTOMY      Allergies  Allergen Reactions   Morphine And Related Anaphylaxis and Other (See Comments)    Given after knee replacement and code blue occurred   Morphine Sulfate Anaphylaxis   Penicillins Hives, Itching and Other (See Comments)    Syncope, also   Nsaids Other (See Comments)    Because of an abnormal kidney test result   Penicillin V Hives   Shellfish Allergy Nausea And Vomiting and Other (See Comments)    Pt has shellfish allergy only.  Has had IV contrast x 2 and did fine.   Shellfish-Derived Products Nausea And Vomiting    Outpatient Encounter Medications as of 12/10/2021  Medication Sig   acetaminophen (TYLENOL) 500 MG tablet Take 1,000 mg by mouth every 8 (eight) hours as needed.   apixaban (ELIQUIS) 5 MG TABS tablet Take 1 tablet (5 mg total) by mouth 2 (two) times daily.   atorvastatin (LIPITOR) 40 MG tablet Take 1 tablet (40 mg total) by mouth daily.   azelastine (ASTELIN) 0.1 % nasal spray Place 1 spray into both nostrils daily as needed for rhinitis or allergies.   B Complex-C-Folic Acid (B-COMPLEX/FOLIC ACID/VITAMIN C PO) Take 1 tablet by mouth daily with breakfast.   bimatoprost (LUMIGAN) 0.03 % ophthalmic solution Place 1 drop into both eyes at bedtime.   calcium carbonate (TUMS EX) 750 MG chewable tablet Chew 750 mg by mouth daily as needed for heartburn.   Cholecalciferol (VITAMIN D3) 50 MCG (2000 UT) TABS Take 2,000 Units by mouth in the morning.   cycloSPORINE (RESTASIS) 0.05 % ophthalmic emulsion Place 1 drop into both eyes 2 (two) times daily.   diclofenac Sodium (VOLTAREN) 1 % GEL Apply 2 g topically in the morning and  at  bedtime.   docusate sodium (COLACE) 100 MG capsule Take 100 mg by mouth daily.   hydrALAZINE (APRESOLINE) 10 MG tablet Take 10 mg by mouth 2 (two) times daily as needed.   levothyroxine (SYNTHROID) 25 MCG tablet Take 25 mcg by mouth daily before breakfast.   meclizine (ANTIVERT) 12.5 MG tablet Take 1 tablet (12.5 mg total) by mouth 2 (two) times daily as needed for dizziness.   melatonin 3 MG TABS tablet Take 1 tablet (3 mg total) by mouth at bedtime.   miconazole (MICOTIN) 2 % powder Apply topically daily. Patient may apply to dry skin under breasts daily and after showering   mirabegron ER (MYRBETRIQ) 25 MG TB24 tablet Take 25 mg by mouth daily.   nystatin (MYCOSTATIN/NYSTOP) powder Apply 1 application topically 2 (two) times daily as needed.   omeprazole (PRILOSEC) 20 MG capsule Take 20 mg by mouth daily.   sodium chloride 1 g tablet Take 1 g by mouth 2 (two) times daily.   zinc oxide 20 % ointment Apply 1 application topically as needed (for redness- to be applied to buttocks/peri area and after every incontinent episode).   No facility-administered encounter medications on file as of 12/10/2021.    Review of Systems  Constitutional:  Negative for activity change, appetite change, fatigue and fever.  HENT:  Positive for hearing loss. Negative for congestion and trouble swallowing.   Eyes:  Negative for visual disturbance.  Respiratory:  Negative for cough and shortness of breath.   Cardiovascular:  Negative for leg swelling.  Gastrointestinal:  Negative for abdominal pain and constipation.  Genitourinary:  Positive for frequency. Negative for dysuria and urgency.  Musculoskeletal:  Positive for arthralgias, back pain and gait problem.       R ankle pain.   Skin:  Negative for color change.  Neurological:  Negative for speech difficulty, weakness and light-headedness.  Psychiatric/Behavioral:  Negative for confusion and sleep disturbance. The patient is not nervous/anxious.      Immunization History  Administered Date(s) Administered   Influenza Split 11/13/2012, 12/13/2012, 11/30/2013, 10/09/2014, 11/24/2018   Influenza, High Dose Seasonal PF 12/01/2013, 10/04/2014, 10/12/2015, 11/06/2016, 11/12/2017   Influenza,inj,Quad PF,6+ Mos 10/24/2010   Influenza-Unspecified 10/24/2010, 11/01/2019, 12/04/2020   Moderna Covid-19 Vaccine Bivalent Booster 61yr & up 07/24/2021   Moderna SARS-COV2 Booster Vaccination 07/18/2020   Moderna Sars-Covid-2 Vaccination 02/14/2019, 03/14/2019, 12/26/2019   PFIZER(Purple Top)SARS-COV-2 Vaccination 10/31/2020   Pfizer Covid-19 Vaccine Bivalent Booster 139yr& up 10/31/2020   Pneumococcal Conjugate-13 09/28/2013   Pneumococcal Polysaccharide-23 03/05/2012   Td 10/16/2010   Tdap 08/27/2000, 10/16/2010   Zoster, Live 01/11/2013, 03/31/2020   Pertinent  Health Maintenance Due  Topic Date Due   INFLUENZA VACCINE  09/10/2021   DEXA SCAN  Completed      11/26/2021    9:57 AM 11/26/2021    7:40 PM 11/27/2021   10:24 AM 11/29/2021    2:37 PM 12/10/2021    9:16 AM  Fall Risk  Falls in the past year?    0 0  Was there an injury with Fall?    0 0  Fall Risk Category Calculator    0 0  Fall Risk Category    Low Low  Patient Fall Risk Level High fall risk High fall risk High fall risk Low fall risk High fall risk  Patient at Risk for Falls Due to    No Fall Risks History of fall(s);Impaired balance/gait;Impaired mobility;Mental status change  Fall risk Follow up  Falls evaluation completed Falls evaluation completed   Functional Status Survey:    Vitals:   12/10/21 0927  BP: (!) 167/90  Pulse: 80  Resp: 18  Temp: 97.6 F (36.4 C)  SpO2: 96%  Weight: 194 lb (88 kg)  Height: '5\' 3"'$  (1.6 m)   Body mass index is 34.37 kg/m. Physical Exam Vitals and nursing note reviewed.  Constitutional:      Appearance: Normal appearance.  HENT:     Head: Normocephalic and atraumatic.     Mouth/Throat:     Mouth: Mucous membranes  are moist.  Eyes:     Extraocular Movements: Extraocular movements intact.     Conjunctiva/sclera: Conjunctivae normal.     Pupils: Pupils are equal, round, and reactive to light.  Cardiovascular:     Rate and Rhythm: Normal rate. Rhythm irregular.     Heart sounds: No murmur heard. Pulmonary:     Effort: Pulmonary effort is normal.     Breath sounds: No rales.  Abdominal:     General: Bowel sounds are normal.     Palpations: Abdomen is soft.     Tenderness: There is no abdominal tenderness.     Hernia: A hernia is present.     Comments: Incision hernia  Musculoskeletal:        General: Tenderness present.     Cervical back: Normal range of motion and neck supple.     Right lower leg: No edema.     Left lower leg: No edema.     Comments: Right ankle brace, chronic pain.  Skin:    General: Skin is warm and dry.  Neurological:     General: No focal deficit present.     Mental Status: She is alert and oriented to person, place, and time. Mental status is at baseline.     Motor: No weakness.     Gait: Gait abnormal.  Psychiatric:        Mood and Affect: Mood normal.        Behavior: Behavior normal.        Thought Content: Thought content normal.     Labs reviewed: Recent Labs    05/27/21 1244 09/12/21 0000 11/24/21 1023 11/26/21 0420  NA 22* 139 135 135  K 4.3 4.4 3.9 3.7  CL 105 103 101 104  CO2 23* 26* 26 23  GLUCOSE  --   --  122* 109*  BUN 26* 27* 22 29*  CREATININE 1.2*  --  1.20* 1.23*  CALCIUM  --  9.5 9.5 8.7*   Recent Labs    05/27/21 0000 05/27/21 1244 11/24/21 1023  AST  --  12* 23  ALT  --  18 16  ALKPHOS  --  63 67  BILITOT  --   --  0.6  PROT  --   --  8.5*  ALBUMIN 3.4*  --  4.2   Recent Labs    05/27/21 1244 09/12/21 0000 11/24/21 1023 11/26/21 0420  WBC 5.6 6.5 8.2 8.9  NEUTROABS 3,097.00 4,381.00 6.6  --   HGB 11.6* 13.9 13.9 12.7  HCT 36 43 44.4 40.1  MCV  --   --  84.6 84.2  PLT 236 212 237 202   Lab Results  Component  Value Date   TSH 3.50 03/28/2021   Lab Results  Component Value Date   HGBA1C 5.7 (H) 11/24/2021   Lab Results  Component Value Date   CHOL 181 11/25/2021   HDL 37 (L) 11/25/2021  [place] 127 (H) 11/25/2021   TRIG 85 11/25/2021   CHOLHDL 4.9 11/25/2021    Significant Diagnostic Results in last 30 days:  US RENAL  Result Date: 11/24/2021 CLINICAL DATA:  Acute kidney injury EXAM: RENAL / URINARY TRACT ULTRASOUND COMPLETE COMPARISON:  None Available. FINDINGS: Right Kidney: Renal measurements: 7.9 x 3.8 x 4.1 cm = volume: 65 mL. Echogenicity within normal limits. No mass or hydronephrosis visualized. Left Kidney: Renal measurements: 8.7 x 4.7 x 4.4 cm = volume: 94 mL. Echogenicity within normal limits. No mass or hydronephrosis visualized. Bladder: Appears normal for degree of bladder distention. Other: None. IMPRESSION: Normal renal ultrasound. Electronically Signed   By: Ulyses Jarred M.D.   On: 11/24/2021 22:07   CT ANGIO HEAD NECK W WO CM  Result Date: 11/24/2021 CLINICAL DATA:  Stroke/TIA, determine embolic source.  Confusion. EXAM: CT ANGIOGRAPHY HEAD AND NECK TECHNIQUE: Multidetector CT imaging of the head and neck was performed using the standard protocol during bolus administration of intravenous contrast. Multiplanar CT image reconstructions and MIPs were obtained to evaluate the vascular anatomy. Carotid stenosis measurements (when applicable) are obtained utilizing NASCET criteria, using the distal internal carotid diameter as the denominator. RADIATION DOSE REDUCTION: This exam was performed according to the departmental dose-optimization program which includes automated exposure control, adjustment of the mA and/or kV according to patient size and/or use of iterative reconstruction technique. CONTRAST:  84m OMNIPAQUE IOHEXOL 350 MG/ML SOLN COMPARISON:  Noncontrast head CT and brain MRI obtained earlier the same day FINDINGS: CTA NECK FINDINGS Aortic arch: There is mild calcified  plaque in the imaged aortic arch. The origins of the major branch vessels are patent. The subclavian arteries are patent to the level imaged. Right carotid system: The right common carotid artery is patent. There is calcified plaque of the bifurcation resulting in less than 50% stenosis. Distal internal carotid artery is widely patent. The external carotid artery is patent. There is no dissection or aneurysm. Left carotid system: Left common, internal, and external carotid arteries are patent, with mild plaque at the bifurcation but no hemodynamically significant stenosis or occlusion. There is no dissection or aneurysm. Vertebral arteries: The vertebral arteries are patent, without hemodynamically significant stenosis or occlusion. There is no dissection or aneurysm. Skeleton: There is mild for age degenerative change of the cervical spine. There is no acute osseous abnormality or suspicious osseous lesion. There is no visible canal hematoma. Other neck: The soft tissues of the neck are unremarkable. Upper chest: The imaged lung apices are clear. Review of the MIP images confirms the above findings CTA HEAD FINDINGS Anterior circulation: There is calcified plaque in the intracranial ICAs without greater than mild stenosis on either side. The bilateral MCAs are patent. There is focal severe stenosis of a right M3 segment (8-134, 11-29). There is no other high-grade stenosis or occlusion. The right A1 segment is hypoplastic/absent, likely congenital. The left A1 segment is patent. Anterior communicating artery is normal. There is short-segment severe stenosis of the right A2 segment (7-289, 8-95). There is distal reconstitution after the stenosis. There is no high-grade stenosis or occlusion on the left. There is no aneurysm or AVM Posterior circulation: The bilateral V4 segments are patent with mild calcified plaque on the right. The basilar artery is patent. The major cerebellar arteries appear patent. The  bilateral PCAs are patent, without proximal stenosis or occlusion. There is atherosclerotic irregularity of the distal branches. There is a fetal origin of the right PCA. There is no aneurysm or  AVM. Venous sinuses: As permitted by contrast timing, patent. Anatomic variants: As above. Review of the MIP images confirms the above findings IMPRESSION: 1. No emergent large vessel occlusion. 2. Intracranial atherosclerotic disease resulting in focal high-grade stenosis of a right M3 segment and right A2 segment. Mild atherosclerotic irregularity elsewhere. 3. Calcified plaque at the carotid bifurcations, right worse than left, without hemodynamically significant stenosis or occlusion. Patent vertebral arteries in the neck. Electronically Signed   By: Valetta Mole M.D.   On: 11/24/2021 16:01   MR BRAIN WO CONTRAST  Result Date: 11/24/2021 CLINICAL DATA:  episode of AMS, dysarthria now resolved EXAM: MRI HEAD WITHOUT CONTRAST TECHNIQUE: Multiplanar, multiecho pulse sequences of the brain and surrounding structures were obtained without intravenous contrast. COMPARISON:  CT head from the same day. FINDINGS: Motion limited study. Brain: Punctate acute infarct in the high right posterior frontal lobe (series 5, images 34 and 28). Additional small acute cortical and subcortical infarcts in the left parietal lobe (for example series 5 image 28). No significant mass effect. Scattered T2/FLAIR hyperintensities limited, compatible chronic microvascular disease. Chronic 1 cm right parafalcine meningioma without significant mass effect. No evidence of acute hemorrhage, midline shift or hydrocephalus. Cerebral atrophy. Vascular: Major arterial flow voids are maintained skull base. Skull and upper cervical spine: Normal marrow signal. Sinuses/Orbits: No acute findings. Other: No mastoid effusions. IMPRESSION: 1. Multiple small acute infarcts in the left frontal and parietal lobes. 2. Chronic 1 cm right parafalcine meningioma  without significant mass effect. Electronically Signed   By: Margaretha Sheffield M.D.   On: 11/24/2021 14:15   CT HEAD WO CONTRAST (5MM)  Result Date: 11/24/2021 CLINICAL DATA:  Altered mental status EXAM: CT HEAD WITHOUT CONTRAST TECHNIQUE: Contiguous axial images were obtained from the base of the skull through the vertex without intravenous contrast. RADIATION DOSE REDUCTION: This exam was performed according to the departmental dose-optimization program which includes automated exposure control, adjustment of the mA and/or kV according to patient size and/or use of iterative reconstruction technique. COMPARISON:  04/06/2020 FINDINGS: Brain: No acute intracranial findings are seen in noncontrast CT brain. There are no signs of bleeding within the cranium. Cortical sulci are prominent. Small old lacunar infarcts are seen in basal ganglia. There is decreased density in periventricular and subcortical white matter. There is a dense calcification, possibly calcified meningioma measuring 14 x 11 mm in the right parafalcine location in the posterior right parietal lobe with no significant change. There is no adjacent edema or mass effect. Vascular: Unremarkable. Skull: Unremarkable. Sinuses/Orbits: There is almost complete opacification of left side of sphenoid sinus with no significant interval change. Other: None. IMPRESSION: No acute intracranial findings are seen in noncontrast CT brain. Atrophy.  Small-vessel disease. Stable calcified meningioma in right parafalcine location. Chronic sphenoid sinusitis. Electronically Signed   By: Elmer Picker M.D.   On: 11/24/2021 10:08    Assessment/Plan Urinary frequency At her baseline,  on Mirabegron  Osteoarthritis R ankle pain, uses ankle brace, takes Tylenol.   HLD (hyperlipidemia) on Atorvastatin, LDL 127 11/25/21  Insomnia off Ambien.   GERD  takes Omeprazole. Hgb 12.7 11/26/21  Osteoporosis  takes Ca, Vit D  Essential hypertension  takes  prn Hydralazine for Sbp >170, Dbp>90.  Slow transit constipation Stable,  takes Colace.   Mixed Alzheimer's and vascular dementia ([place]) goal if to return to AL for supportive care.   Ovarian cancer ([place]) Hx of ovarian CA s/p TAH/BSO 03/2011, in remission.  Acute CVA (cerebrovascular accident) [facility]) Hospitalized  11/24/21-11/27/21 for acute CVA left PCA embolic strokes, no apparent residual symptoms, on Eliquis f/u Neurology  CKD (chronic kidney disease) stage 3, GFR 30-59 ml/min (HCC) Bun/creat 29/1.23 11/26/21  Hypothyroidism  takes Levothyroxine, TSH 3.5 05/27/21  Hyponatremia  Na 135 11/26/21  Syncope Orthostasis ?, no recurrence since last seen.   Atrial flutter (HCC) Heart rate is controlled, f/u Cardiology, off anticoagulation 2/2 falls in the past, restarted Eliquis after acute CVA 11/24/21    Family/ staff Communication: plan of care reviewed with the patient and charge nurse.   Labs/tests ordered:  none  Time spend 35 minutes.

## 2021-12-10 NOTE — Assessment & Plan Note (Signed)
Stable,  takes Colace.

## 2021-12-17 ENCOUNTER — Ambulatory Visit: Payer: Medicare Other | Admitting: Physician Assistant

## 2021-12-20 ENCOUNTER — Encounter: Payer: Self-pay | Admitting: Nurse Practitioner

## 2021-12-20 ENCOUNTER — Non-Acute Institutional Stay (SKILLED_NURSING_FACILITY): Payer: Medicare Other | Admitting: Nurse Practitioner

## 2021-12-20 DIAGNOSIS — F5101 Primary insomnia: Secondary | ICD-10-CM

## 2021-12-20 DIAGNOSIS — I4892 Unspecified atrial flutter: Secondary | ICD-10-CM

## 2021-12-20 DIAGNOSIS — I10 Essential (primary) hypertension: Secondary | ICD-10-CM

## 2021-12-20 DIAGNOSIS — C569 Malignant neoplasm of unspecified ovary: Secondary | ICD-10-CM

## 2021-12-20 DIAGNOSIS — I639 Cerebral infarction, unspecified: Secondary | ICD-10-CM | POA: Diagnosis not present

## 2021-12-20 DIAGNOSIS — K5901 Slow transit constipation: Secondary | ICD-10-CM

## 2021-12-20 DIAGNOSIS — M810 Age-related osteoporosis without current pathological fracture: Secondary | ICD-10-CM

## 2021-12-20 DIAGNOSIS — R300 Dysuria: Secondary | ICD-10-CM | POA: Diagnosis not present

## 2021-12-20 DIAGNOSIS — G309 Alzheimer's disease, unspecified: Secondary | ICD-10-CM

## 2021-12-20 DIAGNOSIS — K219 Gastro-esophageal reflux disease without esophagitis: Secondary | ICD-10-CM

## 2021-12-20 DIAGNOSIS — E785 Hyperlipidemia, unspecified: Secondary | ICD-10-CM

## 2021-12-20 DIAGNOSIS — F0150 Vascular dementia without behavioral disturbance: Secondary | ICD-10-CM

## 2021-12-20 DIAGNOSIS — F0280 Dementia in other diseases classified elsewhere without behavioral disturbance: Secondary | ICD-10-CM

## 2021-12-20 DIAGNOSIS — M159 Polyosteoarthritis, unspecified: Secondary | ICD-10-CM

## 2021-12-20 DIAGNOSIS — N1831 Chronic kidney disease, stage 3a: Secondary | ICD-10-CM

## 2021-12-20 DIAGNOSIS — E871 Hypo-osmolality and hyponatremia: Secondary | ICD-10-CM

## 2021-12-20 DIAGNOSIS — R350 Frequency of micturition: Secondary | ICD-10-CM

## 2021-12-20 DIAGNOSIS — E039 Hypothyroidism, unspecified: Secondary | ICD-10-CM | POA: Diagnosis not present

## 2021-12-20 NOTE — Assessment & Plan Note (Signed)
takes prn Hydralazine for Sbp >170, Dbp>90.

## 2021-12-20 NOTE — Assessment & Plan Note (Signed)
Hx of ovarian CA s/p TAH/BSO 03/2011, in remission.

## 2021-12-20 NOTE — Assessment & Plan Note (Signed)
takes Levothyroxine, TSH 3.5 05/27/21

## 2021-12-20 NOTE — Assessment & Plan Note (Signed)
off Ambien.

## 2021-12-20 NOTE — Assessment & Plan Note (Signed)
goal if to return to AL for supportive care. obtain MMSe

## 2021-12-20 NOTE — Assessment & Plan Note (Signed)
Stable, takes Colace.

## 2021-12-20 NOTE — Assessment & Plan Note (Signed)
takes Ca, Vit D

## 2021-12-20 NOTE — Assessment & Plan Note (Signed)
on Mirabegron, reported increased urinary frequency in setting of dysuria, UA C/S to r/o UTI

## 2021-12-20 NOTE — Assessment & Plan Note (Signed)
Stable, takes Omeprazole. Hgb 12.7 11/26/21

## 2021-12-20 NOTE — Assessment & Plan Note (Signed)
on Atorvastatin, LDL 127 11/25/21 

## 2021-12-20 NOTE — Progress Notes (Unsigned)
Location:   SNF [place] Room Number: NO/43/A Place of Service:  SNF (31) Provider: Lennie Odor Jashawna Reever NP  Virgie Dad, MD  Patient Care Team: Virgie Dad, MD as PCP - General (Internal Medicine) Donato Heinz, MD as PCP - Cardiology (Cardiology) Brien Few, MD as Consulting Physician (Obstetrics and Gynecology) Marti Sleigh, MD as Consulting Physician (Gynecology) Nancy Marus, MD as Consulting Physician (Gynecologic Oncology)  Extended Emergency Contact Information Primary Emergency Contact: [facility] Address: [address]          [place], [place] [address] [name] of [place] Phone: [phone] Mobile Phone: [phone] Relation: Daughter  Code Status: DNR Goals of care: Advanced Directive information    12/10/2021    9:15 AM  Advanced Directives  Does Patient Have a Medical Advance Directive? Yes  Type of Advance Directive [place]  Does patient want to make changes to medical advance directive? No - Patient declined  Copy of [place] in Chart? Yes - validated most recent copy scanned in chart (See row information)     Chief Complaint  Patient presents with   Acute Visit    Burning sensation upon urination.     HPI:  Pt is a 88 y.o. female seen today for an acute visit for c/o burning sensation upon urination, increased urinary frequency and confusion. Denied abd or lower back pain, she is afebrile.   11/24/21 acute CVA left PCA embolic strokes, no apparent residual symptoms, on Eliquis f/u Neurology             CKD, Bun/creat 29/1.23 11/26/21             Hypothyroidism, takes Levothyroxine, TSH 3.5 05/27/21             Hyponatremia, Na 135 11/26/21             Syncope: Orthostasis ?             A flutter, f/u Cardiology, off anticoagulation 2/2 falls in the past, restarted Eliquis after acute CVA 11/24/21             Hx of ovarian CA s/p TAH/BSO 03/2011, in remission.              vascular dementia,  goal if to return to AL for supportive care.              Constipation takes Colace.              HTN, takes prn Hydralazine for Sbp >170, Dbp>90.             OP, takes Ca, Vit D             GERD, takes Omeprazole. Hgb 12.7 11/26/21             Insomnia, off Ambien.              Hyperlipidemia, on Atorvastatin, LDL 127 11/25/21             OA, R ankle pain, uses ankle brace, takes Tylenol.              Urinary frequency, on Mirabegron  Past Medical History:  Diagnosis Date   Anemia associated with acute blood loss 04/03/2011   Arthritis    knees    Ascites    Blood transfusion    hx of 3419    Complication of anesthesia    Sodium drops per pt    Cystitis  DIARRHEA, ANTIBIOTIC ASSOCIATED 05/31/2009   Qualifier: Diagnosis of  By: Tommy Medal MD, Roderic Scarce     GERD (gastroesophageal reflux disease)    H/O hiatal hernia    Hyperlipidemia    Hypertension    Hyponatremia    Neutropenia with fever ([place]) 05/18/2011   OSTEOMYELITIS, CHRONIC, LOWER LEG 04/23/2009   Qualifier: Diagnosis of  By: Johnnye Sima MD, Felecia Shelling    OSTEOPOROSIS 04/23/2009   Qualifier: Diagnosis of  By: Johnnye Sima MD, Jeffrey     Ovarian cancer ([place]) 01/25/2011   Pneumonia    hx of    Postmenopausal atrophic vaginitis    PROSTHETIC JOINT COMPLICATION 6/76/1950   Qualifier: Diagnosis of  By: Tommy Medal MD, Cornelius     Renal disorder    Decreased kidney function   Staph infection 2010   after knee replacement   Urinary frequency    Vulvitis    Past Surgical History:  Procedure Laterality Date   ABDOMINAL HYSTERECTOMY  04/01/2011   Procedure: HYSTERECTOMY ABDOMINAL;  Surgeon: Alvino Chapel, MD;  Location: [facility];  Service: Gynecology;  Laterality: N/A;   APPENDECTOMY     JOINT REPLACEMENT     R knee in 2008, 5 operations on L knee   LAPAROTOMY  04/01/2011   Procedure: EXPLORATORY LAPAROTOMY;  Surgeon: Alvino Chapel, MD;  Location: [facility];  Service:  Gynecology;  Laterality: N/A;   OTHER SURGICAL HISTORY     hx of C section 1966   SALPINGOOPHORECTOMY  04/01/2011   Procedure: SALPINGO OOPHERECTOMY;  Surgeon: Alvino Chapel, MD;  Location: [facility];  Service: Gynecology;  Laterality: Bilateral;   TONSILLECTOMY      Allergies  Allergen Reactions   Morphine And Related Anaphylaxis and Other (See Comments)    Given after knee replacement and code blue occurred   Morphine Sulfate Anaphylaxis   Penicillins Hives, Itching and Other (See Comments)    Syncope, also   Nsaids Other (See Comments)    Because of an abnormal kidney test result   Penicillin V Hives   Shellfish Allergy Nausea And Vomiting and Other (See Comments)    Pt has shellfish allergy only.  Has had IV contrast x 2 and did fine.   Shellfish-Derived Products Nausea And Vomiting    Allergies as of 12/20/2021       Reactions   Morphine And Related Anaphylaxis, Other (See Comments)   Given after knee replacement and code blue occurred   Morphine Sulfate Anaphylaxis   Penicillins Hives, Itching, Other (See Comments)   Syncope, also   Nsaids Other (See Comments)   Because of an abnormal kidney test result   Penicillin V Hives   Shellfish Allergy Nausea And Vomiting, Other (See Comments)   Pt has shellfish allergy only.  Has had IV contrast x 2 and did fine.   Shellfish-derived Products Nausea And Vomiting        Medication List        Accurate as of December 20, 2021 11:59 PM. If you have any questions, ask your nurse or doctor.          acetaminophen 500 MG tablet Commonly known as: TYLENOL Take 1,000 mg by mouth every 8 (eight) hours as needed.   apixaban 5 MG Tabs tablet Commonly known as: ELIQUIS Take 1 tablet (5 mg total) by mouth 2 (two) times daily.   atorvastatin 40 MG tablet Commonly known as: LIPITOR Take 1 tablet (40 mg total) by mouth daily.  azelastine 0.1 % nasal spray Commonly known as: ASTELIN Place 1 spray into both nostrils  daily as needed for rhinitis or allergies.   B-COMPLEX/FOLIC ACID/VITAMIN C PO Take 1 tablet by mouth daily with breakfast.   bimatoprost 0.03 % ophthalmic solution Commonly known as: LUMIGAN Place 1 drop into both eyes at bedtime.   calcium carbonate 750 MG chewable tablet Commonly known as: TUMS EX Chew 750 mg by mouth daily as needed for heartburn.   cycloSPORINE 0.05 % ophthalmic emulsion Commonly known as: RESTASIS Place 1 drop into both eyes 2 (two) times daily.   diclofenac Sodium 1 % Gel Commonly known as: VOLTAREN Apply 2 g topically in the morning and at bedtime.   docusate sodium 100 MG capsule Commonly known as: COLACE Take 100 mg by mouth daily.   hydrALAZINE 10 MG tablet Commonly known as: APRESOLINE Take 10 mg by mouth 2 (two) times daily as needed.   levothyroxine 25 MCG tablet Commonly known as: SYNTHROID Take 25 mcg by mouth daily before breakfast.   meclizine 12.5 MG tablet Commonly known as: ANTIVERT Take 1 tablet (12.5 mg total) by mouth 2 (two) times daily as needed for dizziness.   melatonin 3 MG Tabs tablet Take 1 tablet (3 mg total) by mouth at bedtime.   miconazole 2 % powder Commonly known as: MICOTIN Apply topically daily. Patient may apply to dry skin under breasts daily and after showering   Myrbetriq 25 MG Tb24 tablet Generic drug: mirabegron ER Take 25 mg by mouth daily.   nystatin powder Commonly known as: MYCOSTATIN/NYSTOP Apply 1 application topically 2 (two) times daily as needed.   omeprazole 20 MG capsule Commonly known as: PRILOSEC Take 20 mg by mouth daily.   sodium chloride 1 g tablet Take 1 g by mouth 2 (two) times daily.   Vitamin D3 50 MCG (2000 UT) Tabs Take 2,000 Units by mouth in the morning.   zinc oxide 20 % ointment Apply 1 application topically as needed (for redness- to be applied to buttocks/peri area and after every incontinent episode).        Review of Systems  Constitutional:  Negative for  activity change, appetite change, fatigue and fever.  HENT:  Positive for hearing loss. Negative for congestion and trouble swallowing.   Eyes:  Negative for visual disturbance.  Respiratory:  Negative for cough and shortness of breath.   Cardiovascular:  Negative for leg swelling.  Gastrointestinal:  Negative for abdominal pain, constipation, nausea and vomiting.  Genitourinary:  Positive for dysuria and frequency. Negative for difficulty urinating, flank pain and urgency.  Musculoskeletal:  Positive for arthralgias, back pain and gait problem.       R ankle pain.   Skin:  Negative for color change.  Neurological:  Negative for speech difficulty, weakness and light-headedness.  Psychiatric/Behavioral:  Negative for confusion and sleep disturbance. The patient is not nervous/anxious.     Immunization History  Administered Date(s) Administered   Influenza Split 11/13/2012, 12/13/2012, 11/30/2013, 10/09/2014, 11/24/2018   Influenza, High Dose Seasonal PF 12/01/2013, 10/04/2014, 10/12/2015, 11/06/2016, 11/12/2017   Influenza,inj,Quad PF,6+ Mos 10/24/2010   Influenza-Unspecified 10/24/2010, 11/01/2019, 12/04/2020   Moderna Covid-19 Vaccine Bivalent Booster 31yr & up 07/24/2021   Moderna SARS-COV2 Booster Vaccination 07/18/2020   Moderna Sars-Covid-2 Vaccination 02/14/2019, 03/14/2019, 12/26/2019   PFIZER(Purple Top)SARS-COV-2 Vaccination 10/31/2020   Pfizer Covid-19 Vaccine Bivalent Booster 187yr& up 10/31/2020   Pneumococcal Conjugate-13 09/28/2013   Pneumococcal Polysaccharide-23 03/05/2012   Td 10/16/2010   Tdap 08/27/2000,  10/16/2010   Zoster, Live 01/11/2013, 03/31/2020   Pertinent  Health Maintenance Due  Topic Date Due   INFLUENZA VACCINE  09/10/2021   DEXA SCAN  Completed      11/26/2021    9:57 AM 11/26/2021    7:40 PM 11/27/2021   10:24 AM 11/29/2021    2:37 PM 12/10/2021    9:16 AM  Fall Risk  Falls in the past year?    0 0  Was there an injury with Fall?    0 0   Fall Risk Category Calculator    0 0  Fall Risk Category    Low Low  Patient Fall Risk Level High fall risk High fall risk High fall risk Low fall risk High fall risk  Patient at Risk for Falls Due to    No Fall Risks History of fall(s);Impaired balance/gait;Impaired mobility;Mental status change  Fall risk Follow up    Falls evaluation completed Falls evaluation completed   Functional Status Survey:    Vitals:   12/20/21 1332  BP: (!) 152/74  Pulse: 62  Resp: 17  Temp: (!) 97.3 F (36.3 C)  SpO2: 94%  Weight: 194 lb 14.4 oz (88.4 kg)   Body mass index is 34.52 kg/m. Physical Exam Vitals and nursing note reviewed.  Constitutional:      Appearance: Normal appearance.  HENT:     Head: Normocephalic and atraumatic.     Mouth/Throat:     Mouth: Mucous membranes are moist.  Eyes:     Extraocular Movements: Extraocular movements intact.     Conjunctiva/sclera: Conjunctivae normal.     Pupils: Pupils are equal, round, and reactive to light.  Cardiovascular:     Rate and Rhythm: Normal rate. Rhythm irregular.     Heart sounds: No murmur heard. Pulmonary:     Effort: Pulmonary effort is normal.     Breath sounds: No rales.  Abdominal:     General: Bowel sounds are normal.     Palpations: Abdomen is soft.     Tenderness: There is no abdominal tenderness.     Hernia: A hernia is present.     Comments: Incision hernia  Musculoskeletal:        General: Tenderness present.     Cervical back: Normal range of motion and neck supple.     Right lower leg: No edema.     Left lower leg: No edema.     Comments: Right ankle brace, chronic pain.  Skin:    General: Skin is warm and dry.  Neurological:     General: No focal deficit present.     Mental Status: She is alert and oriented to person, place, and time. Mental status is at baseline.     Motor: No weakness.     Gait: Gait abnormal.  Psychiatric:        Mood and Affect: Mood normal.        Behavior: Behavior normal.         Thought Content: Thought content normal.     Labs reviewed: Recent Labs    05/27/21 1244 09/12/21 0000 11/24/21 1023 11/26/21 0420  NA 22* 139 135 135  K 4.3 4.4 3.9 3.7  CL 105 103 101 104  CO2 23* 26* 26 23  GLUCOSE  --   --  122* 109*  BUN 26* 27* 22 29*  CREATININE 1.2*  --  1.20* 1.23*  CALCIUM  --  9.5 9.5 8.7*   Recent Labs  05/27/21 0000 05/27/21 1244 11/24/21 1023  AST  --  12* 23  ALT  --  18 16  ALKPHOS  --  63 67  BILITOT  --   --  0.6  PROT  --   --  8.5*  ALBUMIN 3.4*  --  4.2   Recent Labs    05/27/21 1244 09/12/21 0000 11/24/21 1023 11/26/21 0420  WBC 5.6 6.5 8.2 8.9  NEUTROABS 3,097.00 4,381.00 6.6  --   HGB 11.6* 13.9 13.9 12.7  HCT 36 43 44.4 40.1  MCV  --   --  84.6 84.2  PLT 236 212 237 202   Lab Results  Component Value Date   TSH 3.50 03/28/2021   Lab Results  Component Value Date   HGBA1C 5.7 (H) 11/24/2021   Lab Results  Component Value Date   CHOL 181 11/25/2021   HDL 37 (L) 11/25/2021   LDLCALC 127 (H) 11/25/2021   TRIG 85 11/25/2021   CHOLHDL 4.9 11/25/2021    Significant Diagnostic Results in last 30 days:  US RENAL  Result Date: 11/24/2021 CLINICAL DATA:  Acute kidney injury EXAM: RENAL / URINARY TRACT ULTRASOUND COMPLETE COMPARISON:  None Available. FINDINGS: Right Kidney: Renal measurements: 7.9 x 3.8 x 4.1 cm = volume: 65 mL. Echogenicity within normal limits. No mass or hydronephrosis visualized. Left Kidney: Renal measurements: 8.7 x 4.7 x 4.4 cm = volume: 94 mL. Echogenicity within normal limits. No mass or hydronephrosis visualized. Bladder: Appears normal for degree of bladder distention. Other: None. IMPRESSION: Normal renal ultrasound. Electronically Signed   By: Ulyses Jarred M.D.   On: 11/24/2021 22:07   CT ANGIO HEAD NECK W WO CM  Result Date: 11/24/2021 CLINICAL DATA:  Stroke/TIA, determine embolic source.  Confusion. EXAM: CT ANGIOGRAPHY HEAD AND NECK TECHNIQUE: Multidetector CT imaging of the head  and neck was performed using the standard protocol during bolus administration of intravenous contrast. Multiplanar CT image reconstructions and MIPs were obtained to evaluate the vascular anatomy. Carotid stenosis measurements (when applicable) are obtained utilizing NASCET criteria, using the distal internal carotid diameter as the denominator. RADIATION DOSE REDUCTION: This exam was performed according to the departmental dose-optimization program which includes automated exposure control, adjustment of the mA and/or kV according to patient size and/or use of iterative reconstruction technique. CONTRAST:  26m OMNIPAQUE IOHEXOL 350 MG/ML SOLN COMPARISON:  Noncontrast head CT and brain MRI obtained earlier the same day FINDINGS: CTA NECK FINDINGS Aortic arch: There is mild calcified plaque in the imaged aortic arch. The origins of the major branch vessels are patent. The subclavian arteries are patent to the level imaged. Right carotid system: The right common carotid artery is patent. There is calcified plaque of the bifurcation resulting in less than 50% stenosis. Distal internal carotid artery is widely patent. The external carotid artery is patent. There is no dissection or aneurysm. Left carotid system: Left common, internal, and external carotid arteries are patent, with mild plaque at the bifurcation but no hemodynamically significant stenosis or occlusion. There is no dissection or aneurysm. Vertebral arteries: The vertebral arteries are patent, without hemodynamically significant stenosis or occlusion. There is no dissection or aneurysm. Skeleton: There is mild for age degenerative change of the cervical spine. There is no acute osseous abnormality or suspicious osseous lesion. There is no visible canal hematoma. Other neck: The soft tissues of the neck are unremarkable. Upper chest: The imaged lung apices are clear. Review of the MIP images confirms the above findings CTA HEAD  FINDINGS Anterior  circulation: There is calcified plaque in the intracranial ICAs without greater than mild stenosis on either side. The bilateral MCAs are patent. There is focal severe stenosis of a right M3 segment (8-134, 11-29). There is no other high-grade stenosis or occlusion. The right A1 segment is hypoplastic/absent, likely congenital. The left A1 segment is patent. Anterior communicating artery is normal. There is short-segment severe stenosis of the right A2 segment (7-289, 8-95). There is distal reconstitution after the stenosis. There is no high-grade stenosis or occlusion on the left. There is no aneurysm or AVM Posterior circulation: The bilateral V4 segments are patent with mild calcified plaque on the right. The basilar artery is patent. The major cerebellar arteries appear patent. The bilateral PCAs are patent, without proximal stenosis or occlusion. There is atherosclerotic irregularity of the distal branches. There is a fetal origin of the right PCA. There is no aneurysm or AVM. Venous sinuses: As permitted by contrast timing, patent. Anatomic variants: As above. Review of the MIP images confirms the above findings IMPRESSION: 1. No emergent large vessel occlusion. 2. Intracranial atherosclerotic disease resulting in focal high-grade stenosis of a right M3 segment and right A2 segment. Mild atherosclerotic irregularity elsewhere. 3. Calcified plaque at the carotid bifurcations, right worse than left, without hemodynamically significant stenosis or occlusion. Patent vertebral arteries in the neck. Electronically Signed   By: Valetta Mole M.D.   On: 11/24/2021 16:01   MR BRAIN WO CONTRAST  Result Date: 11/24/2021 CLINICAL DATA:  episode of AMS, dysarthria now resolved EXAM: MRI HEAD WITHOUT CONTRAST TECHNIQUE: Multiplanar, multiecho pulse sequences of the brain and surrounding structures were obtained without intravenous contrast. COMPARISON:  CT head from the same day. FINDINGS: Motion limited study. Brain:  Punctate acute infarct in the high right posterior frontal lobe (series 5, images 34 and 28). Additional small acute cortical and subcortical infarcts in the left parietal lobe (for example series 5 image 28). No significant mass effect. Scattered T2/FLAIR hyperintensities limited, compatible chronic microvascular disease. Chronic 1 cm right parafalcine meningioma without significant mass effect. No evidence of acute hemorrhage, midline shift or hydrocephalus. Cerebral atrophy. Vascular: Major arterial flow voids are maintained skull base. Skull and upper cervical spine: Normal marrow signal. Sinuses/Orbits: No acute findings. Other: No mastoid effusions. IMPRESSION: 1. Multiple small acute infarcts in the left frontal and parietal lobes. 2. Chronic 1 cm right parafalcine meningioma without significant mass effect. Electronically Signed   By: Margaretha Sheffield M.D.   On: 11/24/2021 14:15   CT HEAD WO CONTRAST (5MM)  Result Date: 11/24/2021 CLINICAL DATA:  Altered mental status EXAM: CT HEAD WITHOUT CONTRAST TECHNIQUE: Contiguous axial images were obtained from the base of the skull through the vertex without intravenous contrast. RADIATION DOSE REDUCTION: This exam was performed according to the departmental dose-optimization program which includes automated exposure control, adjustment of the mA and/or kV according to patient size and/or use of iterative reconstruction technique. COMPARISON:  04/06/2020 FINDINGS: Brain: No acute intracranial findings are seen in noncontrast CT brain. There are no signs of bleeding within the cranium. Cortical sulci are prominent. Small old lacunar infarcts are seen in basal ganglia. There is decreased density in periventricular and subcortical white matter. There is a dense calcification, possibly calcified meningioma measuring 14 x 11 mm in the right parafalcine location in the posterior right parietal lobe with no significant change. There is no adjacent edema or mass  effect. Vascular: Unremarkable. Skull: Unremarkable. Sinuses/Orbits: There is almost complete opacification of left side  of sphenoid sinus with no significant interval change. Other: None. IMPRESSION: No acute intracranial findings are seen in noncontrast CT brain. Atrophy.  Small-vessel disease. Stable calcified meningioma in right parafalcine location. Chronic sphenoid sinusitis. Electronically Signed   By: Elmer Picker M.D.   On: 11/24/2021 10:08    Assessment/Plan: Dysuria c/o burning sensation upon urination, increased urinary frequency and confusion. Denied abd or lower back pain, she is afebrile. Obtain UA C/S 12/20/21 UA +nitrite, mary bacteria, 3+ leukocyte esterase, wbc packed, pending culture 12/23/21 empirical tx Cipro '500mg'$  q12hrs x 7 days. CrCl>30  Stroke (cerebrum) ([place]) 11/24/21 acute CVA left PCA embolic strokes, no apparent residual symptoms, on Eliquis f/u Neurology  CKD (chronic kidney disease) stage 3, GFR 30-59 ml/min ([facility]) Bun/creat 29/1.23 11/26/21  Hypothyroidism takes Levothyroxine, TSH 3.5 05/27/21  Hyponatremia Na 135 11/26/21  Atrial flutter ([place]) /u Cardiology, off anticoagulation 2/2 falls in the past, restarted Eliquis after acute CVA 11/24/21  Ovarian cancer ([place])  Hx of ovarian CA s/p TAH/BSO 03/2011, in remission.  Mixed Alzheimer's and vascular dementia ([place]) goal if to return to AL for supportive care. obtain MMSe  Slow transit constipation Stable, takes Colace.   Essential hypertension takes prn Hydralazine for Sbp >170, Dbp>90.  Osteoporosis takes Ca, Vit D  GERD Stable, takes Omeprazole. Hgb 12.7 11/26/21  Insomnia off Ambien.   HLD (hyperlipidemia) on Atorvastatin, LDL 127 11/25/21  Osteoarthritis  R ankle pain, uses ankle brace, takes Tylenol.   Urinary frequency on Mirabegron, reported increased urinary frequency in setting of dysuria, UA C/S to r/o UTI    Family/ staff Communication: plan of care reviewed with the  patient and charge nurse   Labs/tests ordered:  UA C/S  Time spend 35 minutes.

## 2021-12-20 NOTE — Assessment & Plan Note (Signed)
Na 135 11/26/21

## 2021-12-20 NOTE — Assessment & Plan Note (Signed)
/  u Cardiology, off anticoagulation 2/2 falls in the past, restarted Eliquis after acute CVA 11/24/21

## 2021-12-20 NOTE — Assessment & Plan Note (Signed)
c/o burning sensation upon urination, increased urinary frequency and confusion. Denied abd or lower back pain, she is afebrile. Obtain UA C/S 12/20/21 UA +nitrite, mary bacteria, 3+ leukocyte esterase, wbc packed, pending culture 12/23/21 empirical tx Cipro '500mg'$  q12hrs x 7 days. CrCl>30

## 2021-12-20 NOTE — Assessment & Plan Note (Signed)
11/24/21 acute CVA left PCA embolic strokes, no apparent residual symptoms, on Eliquis f/u Neurology

## 2021-12-20 NOTE — Assessment & Plan Note (Signed)
Bun/creat 29/1.23 11/26/21

## 2021-12-20 NOTE — Assessment & Plan Note (Signed)
R ankle pain, uses ankle brace, takes Tylenol.

## 2022-01-09 ENCOUNTER — Encounter: Payer: Self-pay | Admitting: Nurse Practitioner

## 2022-01-09 ENCOUNTER — Non-Acute Institutional Stay (SKILLED_NURSING_FACILITY): Payer: Medicare Other | Admitting: Nurse Practitioner

## 2022-01-09 DIAGNOSIS — I4892 Unspecified atrial flutter: Secondary | ICD-10-CM

## 2022-01-09 DIAGNOSIS — F0150 Vascular dementia without behavioral disturbance: Secondary | ICD-10-CM

## 2022-01-09 DIAGNOSIS — N1831 Chronic kidney disease, stage 3a: Secondary | ICD-10-CM

## 2022-01-09 DIAGNOSIS — I10 Essential (primary) hypertension: Secondary | ICD-10-CM

## 2022-01-09 DIAGNOSIS — C482 Malignant neoplasm of peritoneum, unspecified: Secondary | ICD-10-CM

## 2022-01-09 DIAGNOSIS — E785 Hyperlipidemia, unspecified: Secondary | ICD-10-CM

## 2022-01-09 DIAGNOSIS — F0280 Dementia in other diseases classified elsewhere without behavioral disturbance: Secondary | ICD-10-CM

## 2022-01-09 DIAGNOSIS — K219 Gastro-esophageal reflux disease without esophagitis: Secondary | ICD-10-CM

## 2022-01-09 DIAGNOSIS — I639 Cerebral infarction, unspecified: Secondary | ICD-10-CM | POA: Diagnosis not present

## 2022-01-09 DIAGNOSIS — M810 Age-related osteoporosis without current pathological fracture: Secondary | ICD-10-CM

## 2022-01-09 DIAGNOSIS — R55 Syncope and collapse: Secondary | ICD-10-CM

## 2022-01-09 DIAGNOSIS — E039 Hypothyroidism, unspecified: Secondary | ICD-10-CM

## 2022-01-09 DIAGNOSIS — M159 Polyosteoarthritis, unspecified: Secondary | ICD-10-CM

## 2022-01-09 DIAGNOSIS — E871 Hypo-osmolality and hyponatremia: Secondary | ICD-10-CM

## 2022-01-09 DIAGNOSIS — G309 Alzheimer's disease, unspecified: Secondary | ICD-10-CM

## 2022-01-09 DIAGNOSIS — R350 Frequency of micturition: Secondary | ICD-10-CM

## 2022-01-09 DIAGNOSIS — N390 Urinary tract infection, site not specified: Secondary | ICD-10-CM

## 2022-01-09 NOTE — Progress Notes (Signed)
Location:  [facility] Room Number: NO/43/A Place of Service:  SNF (31) Provider:  Udell Mazzocco X, NP  Patient Care Team: Lucielle Vokes X, NP as PCP - General (Internal Medicine) Donato Heinz, MD as PCP - Cardiology (Cardiology) Brien Few, MD as Consulting Physician (Obstetrics and Gynecology) Marti Sleigh, MD as Consulting Physician (Gynecology) Nancy Marus, MD as Consulting Physician (Gynecologic Oncology)  Extended Emergency Contact Information Primary Emergency Contact: [facility] Address: [address]          [place], [place] [address] [name] of [place] Phone: [phone] Mobile Phone: [phone] Relation: Daughter  Code Status:  FULL Goals of care: Advanced Directive information    01/09/2022   12:57 PM  Advanced Directives  Does Patient Have a Medical Advance Directive? Yes  Type of Paramedic of [place];Living will  Does patient want to make changes to medical advance directive? No - Patient declined  Copy of [place] in Chart? Yes - validated most recent copy scanned in chart (See row information)     Chief Complaint  Patient presents with   Medical Management of Chronic Issues    Patient is here for a follow up for chronic conditions     HPI:  Pt is a 88 y.o. female seen today for medical management of chronic diseases.    12/23/21 UTI, treated with Cipro '500mg'$  q12hrs x 7 days. CrCl>30   11/24/21 acute CVA left PCA embolic strokes, no apparent residual symptoms, on Eliquis f/u Neurology             CKD, Bun/creat 29/1.23 11/26/21             Hypothyroidism, takes Levothyroxine, TSH 3.5 05/27/21             Hyponatremia, Na 135 11/26/21             Syncope: Orthostasis ?, no further recurrence since admitted to SNF [facility]             A flutter, f/u Cardiology, off anticoagulation 2/2 falls in the past, restarted Eliquis after acute CVA 11/24/21              Hx of ovarian CA s/p TAH/BSO 03/2011, in remission.             vascular dementia,  goal if to return to AL for supportive care.              Constipation takes Colace.              HTN, takes Hydralazine              OP, takes Ca, Vit D             GERD, takes Omeprazole. Hgb 12.7 11/26/21             Insomnia, off Ambien.              Hyperlipidemia, on Atorvastatin, LDL 127 11/25/21             OA, R ankle pain, uses ankle brace, takes Tylenol, w/c for mobility most of time.              Urinary frequency, on Mirabegron  Past Medical History:  Diagnosis Date   Anemia associated with acute blood loss 04/03/2011   Arthritis    knees    Ascites    Blood transfusion    hx of 9735    Complication  of anesthesia    Sodium drops per pt    Cystitis    DIARRHEA, ANTIBIOTIC ASSOCIATED 05/31/2009   Qualifier: Diagnosis of  By: Tommy Medal MD, Roderic Scarce     GERD (gastroesophageal reflux disease)    H/O hiatal hernia    Hyperlipidemia    Hypertension    Hyponatremia    Neutropenia with fever ([place]) 05/18/2011   OSTEOMYELITIS, CHRONIC, LOWER LEG 04/23/2009   Qualifier: Diagnosis of  By: Johnnye Sima MD, Felecia Shelling    OSTEOPOROSIS 04/23/2009   Qualifier: Diagnosis of  By: Johnnye Sima MD, Jeffrey     Ovarian cancer ([place]) 01/25/2011   Pneumonia    hx of    Postmenopausal atrophic vaginitis    PROSTHETIC JOINT COMPLICATION 8/75/6433   Qualifier: Diagnosis of  By: Tommy Medal MD, Cornelius     Renal disorder    Decreased kidney function   Staph infection 2010   after knee replacement   Urinary frequency    Vulvitis    Past Surgical History:  Procedure Laterality Date   ABDOMINAL HYSTERECTOMY  04/01/2011   Procedure: HYSTERECTOMY ABDOMINAL;  Surgeon: Alvino Chapel, MD;  Location: [facility];  Service: Gynecology;  Laterality: N/A;   APPENDECTOMY     JOINT REPLACEMENT     R knee in 2008, 5 operations on L knee   LAPAROTOMY  04/01/2011   Procedure: EXPLORATORY LAPAROTOMY;  Surgeon:  Alvino Chapel, MD;  Location: [facility];  Service: Gynecology;  Laterality: N/A;   OTHER SURGICAL HISTORY     hx of C section 1966   SALPINGOOPHORECTOMY  04/01/2011   Procedure: SALPINGO OOPHERECTOMY;  Surgeon: Alvino Chapel, MD;  Location: [facility];  Service: Gynecology;  Laterality: Bilateral;   TONSILLECTOMY      Allergies  Allergen Reactions   Morphine And Related Anaphylaxis and Other (See Comments)    Given after knee replacement and code blue occurred   Morphine Sulfate Anaphylaxis   Penicillins Hives, Itching and Other (See Comments)    Syncope, also   Nsaids Other (See Comments)    Because of an abnormal kidney test result   Penicillin V Hives   Shellfish Allergy Nausea And Vomiting and Other (See Comments)    Pt has shellfish allergy only.  Has had IV contrast x 2 and did fine.   Shellfish-Derived Products Nausea And Vomiting    Outpatient Encounter Medications as of 01/09/2022  Medication Sig   acetaminophen (TYLENOL) 500 MG tablet Take 1,000 mg by mouth every 8 (eight) hours as needed.   apixaban (ELIQUIS) 5 MG TABS tablet Take 1 tablet (5 mg total) by mouth 2 (two) times daily.   atorvastatin (LIPITOR) 40 MG tablet Take 1 tablet (40 mg total) by mouth daily.   azelastine (ASTELIN) 0.1 % nasal spray Place 1 spray into both nostrils daily as needed for rhinitis or allergies.   B Complex-C-Folic Acid (B-COMPLEX/FOLIC ACID/VITAMIN C PO) Take 1 tablet by mouth daily with breakfast.   bimatoprost (LUMIGAN) 0.03 % ophthalmic solution Place 1 drop into both eyes at bedtime.   calcium carbonate (TUMS EX) 750 MG chewable tablet Chew 750 mg by mouth daily as needed for heartburn.   Cholecalciferol (VITAMIN D3) 50 MCG (2000 UT) TABS Take 2,000 Units by mouth in the morning.   cycloSPORINE (RESTASIS) 0.05 % ophthalmic emulsion Place 1 drop into both eyes 2 (two) times daily.   diclofenac Sodium (VOLTAREN) 1 % GEL Apply 2 g topically in the morning and at  bedtime.    docusate sodium (COLACE) 100 MG capsule Take 100 mg by mouth daily.   hydrALAZINE (APRESOLINE) 10 MG tablet Take 10 mg by mouth 2 (two) times daily as needed.   levothyroxine (SYNTHROID) 25 MCG tablet Take 25 mcg by mouth daily before breakfast.   meclizine (ANTIVERT) 12.5 MG tablet Take 1 tablet (12.5 mg total) by mouth 2 (two) times daily as needed for dizziness.   melatonin 3 MG TABS tablet Take 1 tablet (3 mg total) by mouth at bedtime.   miconazole (MICOTIN) 2 % powder Apply topically daily. Patient may apply to dry skin under breasts daily and after showering   mirabegron ER (MYRBETRIQ) 25 MG TB24 tablet Take 25 mg by mouth daily.   nystatin (MYCOSTATIN/NYSTOP) powder Apply 1 application topically 2 (two) times daily as needed.   omeprazole (PRILOSEC) 20 MG capsule Take 20 mg by mouth daily.   sodium chloride 1 g tablet Take 1 g by mouth 2 (two) times daily.   zinc oxide 20 % ointment Apply 1 application topically as needed (for redness- to be applied to buttocks/peri area and after every incontinent episode).   No facility-administered encounter medications on file as of 01/09/2022.    Review of Systems  Constitutional:  Negative for activity change, appetite change, fatigue and fever.  HENT:  Positive for hearing loss. Negative for congestion and trouble swallowing.   Eyes:  Negative for visual disturbance.  Respiratory:  Negative for cough and shortness of breath.   Cardiovascular:  Negative for leg swelling.  Gastrointestinal:  Negative for abdominal pain, constipation, nausea and vomiting.  Genitourinary:  Positive for frequency. Negative for difficulty urinating, dysuria, flank pain and urgency.  Musculoskeletal:  Positive for arthralgias, back pain and gait problem.       R ankle pain.   Skin:  Negative for color change.  Neurological:  Negative for speech difficulty, weakness and light-headedness.  Psychiatric/Behavioral:  Negative for confusion and sleep disturbance. The  patient is not nervous/anxious.     Immunization History  Administered Date(s) Administered   Influenza Split 11/13/2012, 12/13/2012, 11/30/2013, 10/09/2014, 11/24/2018   Influenza, High Dose Seasonal PF 12/01/2013, 10/04/2014, 10/12/2015, 11/06/2016, 11/12/2017   Influenza,inj,Quad PF,6+ Mos 10/24/2010   Influenza-Unspecified 10/24/2010, 11/01/2019, 12/04/2020   Moderna Covid-19 Vaccine Bivalent Booster 10yr & up 07/24/2021   Moderna SARS-COV2 Booster Vaccination 07/18/2020   Moderna Sars-Covid-2 Vaccination 02/14/2019, 03/14/2019, 12/26/2019   PFIZER(Purple Top)SARS-COV-2 Vaccination 10/31/2020   Pfizer Covid-19 Vaccine Bivalent Booster 118yr& up 10/31/2020   Pneumococcal Conjugate-13 09/28/2013   Pneumococcal Polysaccharide-23 03/05/2012   Td 10/16/2010   Tdap 08/27/2000, 10/16/2010   Zoster, Live 01/11/2013, 03/31/2020   Pertinent  Health Maintenance Due  Topic Date Due   INFLUENZA VACCINE  09/10/2021   DEXA SCAN  Completed      11/26/2021    7:40 PM 11/27/2021   10:24 AM 11/29/2021    2:37 PM 12/10/2021    9:16 AM 01/09/2022   12:58 PM  Fall Risk  Falls in the past year?   0 0 0  Was there an injury with Fall?   0 0 1  Fall Risk Category Calculator   0 0 1  Fall Risk Category   Low Low Low  Patient Fall Risk Level High fall risk High fall risk Low fall risk High fall risk High fall risk  Patient at Risk for Falls Due to   No Fall Risks History of fall(s);Impaired balance/gait;Impaired mobility;Mental status change History of fall(s);Impaired balance/gait;Impaired mobility;Mental  status change  Fall risk Follow up   Falls evaluation completed Falls evaluation completed Falls evaluation completed   Functional Status Survey:    Vitals:   01/09/22 1302  BP: (!) 168/76  Pulse: 60  Resp: 16  Temp: 97.6 F (36.4 C)  SpO2: 100%  Weight: 194 lb (88 kg)  Height: '5\' 3"'$  (1.6 m)   Body mass index is 34.37 kg/m. Physical Exam Vitals and nursing note reviewed.   Constitutional:      Appearance: Normal appearance.  HENT:     Head: Normocephalic and atraumatic.     Mouth/Throat:     Mouth: Mucous membranes are moist.  Eyes:     Extraocular Movements: Extraocular movements intact.     Conjunctiva/sclera: Conjunctivae normal.     Pupils: Pupils are equal, round, and reactive to light.  Cardiovascular:     Rate and Rhythm: Normal rate. Rhythm irregular.     Heart sounds: No murmur heard. Pulmonary:     Effort: Pulmonary effort is normal.     Breath sounds: No rales.  Abdominal:     General: Bowel sounds are normal.     Palpations: Abdomen is soft.     Tenderness: There is no abdominal tenderness.     Hernia: A hernia is present.     Comments: Incision hernia  Musculoskeletal:        General: Tenderness present.     Cervical back: Normal range of motion and neck supple.     Right lower leg: No edema.     Left lower leg: No edema.     Comments: Right ankle brace, chronic pain.  Skin:    General: Skin is warm and dry.  Neurological:     General: No focal deficit present.     Mental Status: She is alert and oriented to person, place, and time. Mental status is at baseline.     Motor: No weakness.     Gait: Gait abnormal.  Psychiatric:        Mood and Affect: Mood normal.        Behavior: Behavior normal.        Thought Content: Thought content normal.     Labs reviewed: Recent Labs    05/27/21 1244 09/12/21 0000 11/24/21 1023 11/26/21 0420  NA 22* 139 135 135  K 4.3 4.4 3.9 3.7  CL 105 103 101 104  CO2 23* 26* 26 23  GLUCOSE  --   --  122* 109*  BUN 26* 27* 22 29*  CREATININE 1.2*  --  1.20* 1.23*  CALCIUM  --  9.5 9.5 8.7*   Recent Labs    05/27/21 0000 05/27/21 1244 11/24/21 1023  AST  --  12* 23  ALT  --  18 16  ALKPHOS  --  63 67  BILITOT  --   --  0.6  PROT  --   --  8.5*  ALBUMIN 3.4*  --  4.2   Recent Labs    05/27/21 1244 09/12/21 0000 11/24/21 1023 11/26/21 0420  WBC 5.6 6.5 8.2 8.9  NEUTROABS  3,097.00 4,381.00 6.6  --   HGB 11.6* 13.9 13.9 12.7  HCT 36 43 44.4 40.1  MCV  --   --  84.6 84.2  PLT 236 212 237 202   Lab Results  Component Value Date   TSH 3.50 03/28/2021   Lab Results  Component Value Date   HGBA1C 5.7 (H) 11/24/2021   Lab Results  Component Value  Date   CHOL 181 11/25/2021   HDL 37 (L) 11/25/2021   LDLCALC 127 (H) 11/25/2021   TRIG 85 11/25/2021   CHOLHDL 4.9 11/25/2021    Significant Diagnostic Results in last 30 days:  No results found.  Assessment/Plan Acute lower UTI 12/23/21 UTI, treated with Cipro '500mg'$  q12hrs x 7 days. CrCl>30   Stroke (cerebrum) ([place]) 11/24/21 acute CVA left PCA embolic strokes, no apparent residual symptoms, on Eliquis f/u Neurology  CKD (chronic kidney disease) stage 3, GFR 30-59 ml/min ([facility]) Bun/creat 29/1.23 11/26/21  Hypothyroidism takes Levothyroxine, TSH 3.5 05/27/21  Hyponatremia Na 135 11/26/21  Syncope Orthostasis ?, no further recurrence since admitted to SNF [facility]  Atrial flutter ([place])   A flutter, f/u Cardiology, off anticoagulation 2/2 falls in the past, restarted Eliquis after acute CVA 11/24/21  Primary peritoneal carcinomatosis ([place])  Hx of ovarian CA s/p TAH/BSO 03/2011, in remission.  Mixed Alzheimer's and vascular dementia ([place])  goal if to return to AL for supportive care.   Essential hypertension Blood pressure is controlled, on Hydralazine, one episodes elevated Bp, Clonidine 0.'1mg'$  x1 was effective.   Osteoporosis  takes Ca, Vit D  GERD Stable, takes Omeprazole. Hgb 12.7 11/26/21  Urinary frequency on Mirabegron  Osteoarthritis R ankle pain, uses ankle brace, takes Tylenol, w/c for mobility most of time.   HLD (hyperlipidemia)  on Atorvastatin, LDL 127 11/25/21    Family/ staff Communication: plan of care reviewed with the patient and charge nurse.   Labs/tests ordered:  none  Time spend 35 minutes.

## 2022-01-14 ENCOUNTER — Encounter: Payer: Self-pay | Admitting: Nurse Practitioner

## 2022-01-14 NOTE — Assessment & Plan Note (Signed)
12/23/21 UTI, treated with Cipro '500mg'$  q12hrs x 7 days. CrCl>30

## 2022-01-14 NOTE — Assessment & Plan Note (Signed)
takes Ca, Vit D

## 2022-01-14 NOTE — Assessment & Plan Note (Signed)
A flutter, f/u Cardiology, off anticoagulation 2/2 falls in the past, restarted Eliquis after acute CVA 11/24/21

## 2022-01-14 NOTE — Assessment & Plan Note (Signed)
Orthostasis ?, no further recurrence since admitted to SNF [facility]

## 2022-01-14 NOTE — Assessment & Plan Note (Signed)
Stable, takes Omeprazole. Hgb 12.7 11/26/21

## 2022-01-14 NOTE — Assessment & Plan Note (Signed)
goal if to return to [facility] for supportive care.

## 2022-01-14 NOTE — Assessment & Plan Note (Signed)
Hx of ovarian CA s/p TAH/BSO 03/2011, in remission.

## 2022-01-14 NOTE — Assessment & Plan Note (Signed)
Blood pressure is controlled, on Hydralazine, one episodes elevated Bp, Clonidine 0.'1mg'$  x1 was effective.

## 2022-01-14 NOTE — Assessment & Plan Note (Signed)
R ankle pain, uses ankle brace, takes Tylenol, w/c for mobility most of time.  

## 2022-01-14 NOTE — Assessment & Plan Note (Signed)
takes Levothyroxine, TSH 3.5 05/27/21

## 2022-01-14 NOTE — Assessment & Plan Note (Signed)
Bun/creat 29/1.23 11/26/21

## 2022-01-14 NOTE — Assessment & Plan Note (Signed)
on Mirabegron

## 2022-01-14 NOTE — Assessment & Plan Note (Signed)
Na 135 11/26/21

## 2022-01-14 NOTE — Assessment & Plan Note (Signed)
on Atorvastatin, LDL 127 11/25/21 

## 2022-01-14 NOTE — Assessment & Plan Note (Signed)
11/24/21 acute CVA left PCA embolic strokes, no apparent residual symptoms, on Eliquis f/u Neurology

## 2022-01-22 ENCOUNTER — Other Ambulatory Visit: Payer: Self-pay

## 2022-01-22 ENCOUNTER — Encounter (HOSPITAL_COMMUNITY): Payer: Self-pay

## 2022-01-22 ENCOUNTER — Emergency Department (HOSPITAL_COMMUNITY): Payer: Medicare Other

## 2022-01-22 ENCOUNTER — Emergency Department (HOSPITAL_COMMUNITY)
Admission: EM | Admit: 2022-01-22 | Discharge: 2022-01-22 | Disposition: A | Discharge status: Home / Self Care | Payer: Medicare Other | Attending: Emergency Medicine | Admitting: Emergency Medicine

## 2022-01-22 DIAGNOSIS — Z79899 Other long term (current) drug therapy: Secondary | ICD-10-CM | POA: Diagnosis not present

## 2022-01-22 DIAGNOSIS — Z8543 Personal history of malignant neoplasm of ovary: Secondary | ICD-10-CM | POA: Insufficient documentation

## 2022-01-22 DIAGNOSIS — Z1152 Encounter for screening for COVID-19: Secondary | ICD-10-CM | POA: Insufficient documentation

## 2022-01-22 DIAGNOSIS — R4182 Altered mental status, unspecified: Secondary | ICD-10-CM | POA: Diagnosis not present

## 2022-01-22 DIAGNOSIS — E039 Hypothyroidism, unspecified: Secondary | ICD-10-CM | POA: Insufficient documentation

## 2022-01-22 DIAGNOSIS — I129 Hypertensive chronic kidney disease with stage 1 through stage 4 chronic kidney disease, or unspecified chronic kidney disease: Secondary | ICD-10-CM | POA: Insufficient documentation

## 2022-01-22 DIAGNOSIS — N183 Chronic kidney disease, stage 3 unspecified: Secondary | ICD-10-CM | POA: Diagnosis not present

## 2022-01-22 DIAGNOSIS — Z7901 Long term (current) use of anticoagulants: Secondary | ICD-10-CM | POA: Insufficient documentation

## 2022-01-22 DIAGNOSIS — Z8673 Personal history of transient ischemic attack (TIA), and cerebral infarction without residual deficits: Secondary | ICD-10-CM | POA: Diagnosis not present

## 2022-01-22 DIAGNOSIS — J189 Pneumonia, unspecified organism: Secondary | ICD-10-CM

## 2022-01-22 DIAGNOSIS — J181 Lobar pneumonia, unspecified organism: Secondary | ICD-10-CM | POA: Insufficient documentation

## 2022-01-22 DIAGNOSIS — F0390 Unspecified dementia without behavioral disturbance: Secondary | ICD-10-CM | POA: Diagnosis not present

## 2022-01-22 LAB — CBC WITH DIFFERENTIAL/PLATELET
Abs Immature Granulocytes: 0.02 10*3/uL (ref 0.00–0.07)
Basophils Absolute: 0 10*3/uL (ref 0.0–0.1)
Basophils Relative: 1 %
Eosinophils Absolute: 0 10*3/uL (ref 0.0–0.5)
Eosinophils Relative: 1 %
HCT: 40.6 % (ref 36.0–46.0)
Hemoglobin: 12.6 g/dL (ref 12.0–15.0)
Immature Granulocytes: 0 %
Lymphocytes Relative: 11 %
Lymphs Abs: 0.8 10*3/uL (ref 0.7–4.0)
MCH: 26.1 pg (ref 26.0–34.0)
MCHC: 31 g/dL (ref 30.0–36.0)
MCV: 84.1 fL (ref 80.0–100.0)
Monocytes Absolute: 0.6 10*3/uL (ref 0.1–1.0)
Monocytes Relative: 7 %
Neutro Abs: 6.3 10*3/uL (ref 1.7–7.7)
Neutrophils Relative %: 80 %
Platelets: 191 10*3/uL (ref 150–400)
RBC: 4.83 MIL/uL (ref 3.87–5.11)
RDW: 15.5 % (ref 11.5–15.5)
WBC: 7.8 10*3/uL (ref 4.0–10.5)
nRBC: 0 % (ref 0.0–0.2)

## 2022-01-22 LAB — URINALYSIS, ROUTINE W REFLEX MICROSCOPIC
Bilirubin Urine: NEGATIVE
Glucose, UA: NEGATIVE mg/dL
Hgb urine dipstick: NEGATIVE
Ketones, ur: NEGATIVE mg/dL
Leukocytes,Ua: NEGATIVE
Nitrite: NEGATIVE
Protein, ur: 30 mg/dL — AB
Specific Gravity, Urine: 1.01 (ref 1.005–1.030)
pH: 7 (ref 5.0–8.0)

## 2022-01-22 LAB — RESP PANEL BY RT-PCR (RSV, FLU A&B, COVID)  RVPGX2
Influenza A by PCR: NEGATIVE
Influenza B by PCR: NEGATIVE
Resp Syncytial Virus by PCR: NEGATIVE
SARS Coronavirus 2 by RT PCR: NEGATIVE

## 2022-01-22 LAB — COMPREHENSIVE METABOLIC PANEL
ALT: 16 U/L (ref 0–44)
AST: 26 U/L (ref 15–41)
Albumin: 3.5 g/dL (ref 3.5–5.0)
Alkaline Phosphatase: 68 U/L (ref 38–126)
Anion gap: 9 (ref 5–15)
BUN: 21 mg/dL (ref 8–23)
CO2: 25 mmol/L (ref 22–32)
Calcium: 9.3 mg/dL (ref 8.9–10.3)
Chloride: 104 mmol/L (ref 98–111)
Creatinine, Ser: 1.23 mg/dL — ABNORMAL HIGH (ref 0.44–1.00)
GFR, Estimated: 42 mL/min — ABNORMAL LOW (ref >60)
Glucose, Bld: 125 mg/dL — ABNORMAL HIGH (ref 70–99)
Potassium: 4.5 mmol/L (ref 3.5–5.1)
Sodium: 138 mmol/L (ref 135–145)
Total Bilirubin: 0.6 mg/dL (ref 0.3–1.2)
Total Protein: 7.1 g/dL (ref 6.5–8.1)

## 2022-01-22 MED ORDER — DOXYCYCLINE HYCLATE 100 MG PO CAPS: 100.0000 mg | ORAL_CAPSULE | Freq: Two times a day (BID) | ORAL | 0 refills | Status: AC

## 2022-01-22 MED ORDER — CLONIDINE HCL 0.1 MG PO TABS
0.1000 mg | ORAL_TABLET | Freq: Once | ORAL | Status: AC
  Administered 2022-01-22: 0.1 mg via ORAL
  Filled 2022-01-22: qty 1

## 2022-01-22 MED ORDER — DOXYCYCLINE HYCLATE 100 MG PO TABS
100.0000 mg | ORAL_TABLET | Freq: Once | ORAL | Status: AC
  Administered 2022-01-22: 100 mg via ORAL
  Filled 2022-01-22: qty 1

## 2022-01-22 NOTE — ED Notes (Signed)
[facility] called  

## 2022-01-22 NOTE — ED Triage Notes (Signed)
Arrives EMS from [facility] for potential altered mental status.   Reported that facility is unsure of baseline. Appears to have been admitted there October after a stroke and started on Eliquis.   Pt alert and oriented to person, place, and somewhat time but not to situation. Pt says she feels fine and is unsure why she is here.

## 2022-01-22 NOTE — Discharge Instructions (Addendum)
You were evaluated in the Emergency Department and after careful evaluation, we did not find any emergent condition requiring admission or further testing in the hospital.  Your chest x-ray showed pneumonia.  Your recent medical we will err on the side of caution and treat you for this today.  Your workup was otherwise unremarkable.  Your MRI did not show any new strokes.  Your urine did not show any signs of infection.  Please return to the Emergency Department if you experience any worsening of your condition.  We encourage you to follow up with a primary care provider.  Thank you for allowing [place] to be a part of your care.

## 2022-01-22 NOTE — ED Provider Notes (Signed)
[name]   CSN: [id number] Arrival date & time: 01/22/22  1443     History  Chief Complaint  Patient presents with   Concern Of Wellbeing    [name] is a 88 y.o. female.  HPI 88 year old female with history of hypertension, strokes on Eliquis, hyperlipidemia, osteomyelitis of the lower leg, ovarian cancer, chronic hyponatremia on salt tabs, Alzheimer's dementia, a flutter, hypothyroidism, CKD stage III, anemia presented to the ER from [name] with concerns for lethargy/altered mental status.  Reportedly the patient was not arousable this morning however the facility is not aware of her baseline mental status.  Patient was admitted there about 2 months ago after having a stroke.  On arrival, the patient is alert and oriented x 2, nontoxic-appearing.  I spoke with her daughter [name] states that the patient has otherwise been in her normal state of health other than her recent strokes.  Patient has severe dementia and is a poor historian but is denying any active complaints for now. She denies any headache, chest pain or shortness of breath. She continuously asked why she is here now. And what is going on.     Home Medications Prior to Admission medications   Medication Sig Start Date End Date Taking? Authorizing Provider  acetaminophen (TYLENOL) 500 MG tablet Take 1,000 mg by mouth every 8 (eight) hours as needed.    [provider]  apixaban (ELIQUIS) 5 MG TABS tablet Take 1 tablet (5 mg total) by mouth 2 (two) times daily. 11/29/21   Enzo Bi, MD  atorvastatin (LIPITOR) 40 MG tablet Take 1 tablet (40 mg total) by mouth daily. 11/28/21   Enzo Bi, MD  azelastine (ASTELIN) 0.1 % nasal spray Place 1 spray into both nostrils daily as needed for rhinitis or allergies. 12/06/15   [provider]  B Complex-C-Folic Acid (B-COMPLEX/FOLIC ACID/VITAMIN C PO) Take 1 tablet by mouth daily with  breakfast.    [provider]  bimatoprost (LUMIGAN) 0.03 % ophthalmic solution Place 1 drop into both eyes at bedtime.    [provider]  calcium carbonate (TUMS EX) 750 MG chewable tablet Chew 750 mg by mouth daily as needed for heartburn.    [provider]  Cholecalciferol (VITAMIN D3) 50 MCG (2000 UT) TABS Take 2,000 Units by mouth in the morning.    [provider]  cycloSPORINE (RESTASIS) 0.05 % ophthalmic emulsion Place 1 drop into both eyes 2 (two) times daily.    [provider]  diclofenac Sodium (VOLTAREN) 1 % GEL Apply 2 g topically in the morning and at bedtime.    [provider]  docusate sodium (COLACE) 100 MG capsule Take 100 mg by mouth daily.    [provider]  hydrALAZINE (APRESOLINE) 10 MG tablet Take 10 mg by mouth 2 (two) times daily as needed.    [provider]  levothyroxine (SYNTHROID) 25 MCG tablet Take 25 mcg by mouth daily before breakfast.    [provider]  meclizine (ANTIVERT) 12.5 MG tablet Take 1 tablet (12.5 mg total) by mouth 2 (two) times daily as needed for dizziness. 04/11/20   Eulogio Bear U, DO  melatonin 3 MG TABS tablet Take 1 tablet (3 mg total) by mouth at bedtime. 04/11/20   Geradine Girt, DO  miconazole (MICOTIN) 2 % powder Apply topically daily. Patient may apply to dry skin under breasts daily and after showering    [provider]  mirabegron ER (MYRBETRIQ) 25 MG TB24 tablet Take 25 mg by mouth daily.    [provider]  nystatin (MYCOSTATIN/NYSTOP) powder Apply 1 application topically 2 (two) times daily as needed.    [provider]  omeprazole (PRILOSEC) 20 MG capsule Take 20 mg by mouth daily.    [provider]  sodium chloride 1 g tablet Take 1 g by mouth 2 (two) times daily.    [provider]  zinc oxide 20 % ointment Apply 1 application topically as needed (for redness- to be applied to buttocks/peri area and after  every incontinent episode).    [provider]      Allergies    Morphine and related, Morphine sulfate, Penicillins, Nsaids, Penicillin v, Shellfish allergy, and Shellfish-derived products    Review of Systems   Review of Systems  Physical Exam Updated Vital Signs BP (!) 133/44   Pulse 77   Temp 98.2 F (36.8 C)   Resp 20   SpO2 98%  Physical Exam Vitals and nursing [name] reviewed.  Constitutional:      General: She is not in acute distress.    Appearance: She is well-developed.  HENT:     Head: Normocephalic and atraumatic.  Eyes:     Conjunctiva/sclera: Conjunctivae normal.  Cardiovascular:     Rate and Rhythm: Normal rate and regular rhythm.     Heart sounds: No murmur heard. Pulmonary:     Effort: Pulmonary effort is normal. No respiratory distress.     Breath sounds: Normal breath sounds.  Abdominal:     Palpations: Abdomen is soft.     Tenderness: There is no abdominal tenderness.  Musculoskeletal:        General: No swelling.     Cervical back: Neck supple.  Skin:    General: Skin is warm and dry.     Capillary Refill: Capillary refill takes less than 2 seconds.  Neurological:     General: No focal deficit present.     Mental Status: She is alert and oriented to person, place, and time.     Sensory: No sensory deficit.     Motor: No weakness.     Comments: Mental Status:  Alert, thought content appropriate, able to give a coherent history. Speech fluent without evidence of aphasia. Able to follow 2 step commands without difficulty.  Cranial Nerves:  II:  Peripheral visual fields grossly normal, pupils equal, round, reactive to light III,IV, VI: ptosis not present, extra-ocular motions intact bilaterally  V,VII: smile symmetric, facial light touch sensation equal VIII: hearing grossly normal to voice  X: uvula elevates symmetrically  XI: bilateral shoulder shrug symmetric and strong XII: midline tongue extension without fassiculations Motor:   Normal tone. 5/5 strength of BUE and BLE major muscle groups including strong and equal grip strength and dorsiflexion/plantar flexion Sensory: light touch normal in all extremities. Cerebellar: normal finger-to-nose with bilateral upper extremities, Romberg sign absent Gait: Not assessed    Psychiatric:        Mood and Affect: Mood normal.     ED Results / Procedures / Treatments   Labs (all labs ordered are listed, but only abnormal results are displayed) Labs Reviewed  COMPREHENSIVE METABOLIC PANEL - Abnormal; Notable for the following components:      Result Value   Glucose, Bld 125 (*)    Creatinine, Ser 1.23 (*)    GFR, Estimated 42 (*)    All other components within normal limits  URINALYSIS, ROUTINE W REFLEX  MICROSCOPIC - Abnormal; Notable for the following components:   Protein, ur 30 (*)    Bacteria, UA RARE (*)    All other components within normal limits  RESP PANEL BY RT-PCR (RSV, FLU A&B, COVID)  [id number]  CBC WITH DIFFERENTIAL/PLATELET    EKG EKG Interpretation  Date/Time:  Wednesday January 22 2022 07:02:29 EST Ventricular Rate:  82 PR Interval:  185 QRS Duration: 81 QT Interval:  393 QTC Calculation: 459 R Axis:   -29 Text Interpretation: Sinus rhythm Abnormal R-wave progression, early transition Confirmed by Lennice Sites ([id number]) on 01/22/2022 9:11:41 AM  Radiology MR BRAIN WO CONTRAST  Result Date: 01/22/2022 CLINICAL DATA:  Acute neuro deficit.  Rule out stroke EXAM: MRI HEAD WITHOUT CONTRAST TECHNIQUE: Multiplanar, multiecho pulse sequences of the brain and surrounding structures were obtained without intravenous contrast. COMPARISON:  CT head 01/22/2022.  MRI head 11/24/2021 FINDINGS: Brain: Negative for acute infarct Moderate white matter changes with confluent and diffuse T2 and FLAIR hyperintensity throughout the white matter. Chronic lacunar infarcts are present in the basal ganglia bilaterally. Mild chronic ischemic change in the pons. Chronic  small infarct right cerebellum. Small chronic cortical infarct in the left posterior parietal lobe with laminar necrosis. This area shows restricted diffusion on the MRI of 11/24/2021. Ventricle size normal. Generalized atrophy. Vascular: Normal arterial flow voids. Skull and upper cervical spine: No focal abnormality. Sinuses/Orbits: Mild mucosal edema paranasal sinuses. Left cataract extraction. No orbital mass Other: None IMPRESSION: 1. Negative for acute infarct. 2. Moderate atrophy.  Moderate chronic ischemic changes. Electronically Signed   By: Franchot Gallo M.D.   On: 01/22/2022 11:55   CT Head Wo Contrast  Result Date: 01/22/2022 CLINICAL DATA:  Delirium.  Altered mental status EXAM: CT HEAD WITHOUT CONTRAST TECHNIQUE: Contiguous axial images were obtained from the base of the skull through the vertex without intravenous contrast. RADIATION DOSE REDUCTION: This exam was performed according to the departmental dose-optimization program which includes automated exposure control, adjustment of the mA and/or kV according to patient size and/or use of iterative reconstruction technique. COMPARISON:  None Available. FINDINGS: Brain: No acute intracranial hemorrhage. No focal mass lesion. No CT evidence of acute infarction. No midline shift or mass effect. No hydrocephalus. Basilar cisterns are patent. There are periventricular and subcortical white matter hypodensities. Generalized cortical atrophy. Vascular: No hyperdense vessel or unexpected calcification. Skull: Normal. Negative for fracture or focal lesion. Sinuses/Orbits: Paranasal sinuses and mastoid air cells are clear. Orbits are clear. Other: None. IMPRESSION: 1. No acute intracranial findings. 2. Atrophy and white matter microvascular disease Electronically Signed   By: Suzy Bouchard M.D.   On: 01/22/2022 07:55   DG Chest Portable 1 View  Result Date: 01/22/2022 CLINICAL DATA:  Altered mental status. EXAM: PORTABLE CHEST 1 VIEW COMPARISON:   Portable chest 04/18/2020 FINDINGS: There is patchy opacity in the left lower lung field concerning for consolidated pneumonia, possibly of the lingula as there is silhouetting of the lower left heart border. The remainder of the lungs are clear. The heart is slightly enlarged and no vascular congestion is seen. There is moderate calcification of the aorta with stable mediastinal configuration. There is osteopenia, thoracic spondylosis and slight dextroscoliosis. No appreciable pleural effusion. IMPRESSION: Patchy opacity in the left lower lung field concerning for consolidated pneumonia. Clinical correlation and radiographic follow-up recommended. Electronically Signed   By: Telford Nab M.D.   On: 01/22/2022 07:07    Procedures Procedures    Medications Ordered in ED Medications  cloNIDine (CATAPRES)  tablet 0.1 mg (0.1 mg Oral Given 01/22/22 1032)  doxycycline (VIBRA-TABS) tablet 100 mg (100 mg Oral Given 01/22/22 1032)    ED Course/ Medical Decision Making/ A&P                           Medical Decision Making Amount and/or Complexity of Data Reviewed Labs: ordered. Radiology: ordered.  Risk Prescription drug management.   INITIAL IMPRESSION / ASSESSMENT AND PLAN / ED COURSE   Pertinent labs & imaging results that were available during my care of the patient were reviewed by me and considered in my medical decision making (see chart for details).   This patient is Presenting for Evaluation of altered mental status?, which does require a range of treatment options, and is a complaint that involves a high risk of morbidity and mortality.   The Differential Diagnoses includes but is not exclusive to UTI, stroke, sepsis, intracranial bleed, ACS, uremia, hyperammonemia, polypharmacy     I did obtain Additional Historical Information from daughter at bedside   I decided to review pertinent External Data, and in summary has had 2 strokes in the last several months, is on  anticoagulation   Clinical Laboratory Tests ordered, reviewed, and interpreted by me  -CBC the without leukocytosis, stable open -CMP with baseline creatinine, no electrolyte abnormalities -COVID and flu negative -UA not consistent with UTI   Radiologic Tests Ordered,  I independently interpreted the images and agree with radiology interpretation.   Chest x-ray with left lower lung field consolidation concerning for pneumonia, CT of the head unremarkable, MRI of the brain with no evidence of new stroke   Cardiac Monitor Tracing which shows sinus rhythm   Social Determinants of Health Risk patient lives at facility     Medical Decision Making: Summary:  88 year old female with questionable altered mental status from facility however at baseline alert and oriented x 2/3.  Patient has no complaints.  She was hypertensive on arrival with blood pressure of 180/73, however her daughter reports that this has been her baseline and her PCP has been working on a antihypertensive regimen for her.  She denies any headache, blurry vision, nausea, vomiting, chest pain.  Physical exam is unremarkable.  Lab work overall reassuring.  UA not consistent with UTI.  Chest x-ray with questionable pneumonia however patient denies any coughing, fevers, feeling poorly.  She was given clonidine for her hypertension with improvement.  CT of the head unremarkable, however her daughter expressed concerns of possible stroke as she had a similar presentation in the past.  MRI was ordered and did not reveal any new strokes.  Despite the patient being asymptomatic, given her age and comorbidities, will treat for a community-acquired pneumonia with doxycycline.  Encouraged PCP follow-up.  We discussed return precautions.  Daughter voiced understanding and is agreeable. Reevaluation with update and discussion with patient and daughter at bedside, stable for discharge with Treatment  Considered admission however given reassuring  notes, workup, no indication for admission Disposition: Discharge with strict return precautions  This was a shared visit with my supervising physician Dr. Ronnald Nian who independently saw and evaluated the patient & provided guidance in evaluation/management/disposition ,in agreement with care   Final Clinical Impression(s) / ED Diagnoses Final diagnoses:  Altered mental status, unspecified altered mental status type  Community acquired pneumonia of left lower lobe of lung    Rx / DC Orders ED Discharge Orders     None  Garald Balding, PA-C 01/22/22 [place], [place], DO 01/22/22 1326

## 2022-01-22 NOTE — ED Provider Notes (Signed)
I personally evaluated the patient during the encounter and completed a history, physical, procedures, medical decision making to contribute to the overall care of the patient and decision making for the patient briefly, the patient is a 88 y.o. female with weakness.  Family concerned that she might have another stroke.  She has had 2 strokes here recently.  She is on Eliquis.  She was started on a couple new blood pressure medicines.  Blood pressure mildly elevated here.  Will give her dose of clonidine.  Neurologically she appears to be intact.  Chest x-ray per my review interpretation with may be an infiltrate, radiology suspects infiltrate.  Overall she does not endorse fever or cough or sputum production but family would like to have patient treated for pneumonia which I think is reasonable.  Per my review of labs otherwise she has no significant anemia or electrolyte abnormality or kidney injury.  Head CT showed no acute findings per radiology report.  EKG per my review interpretation shows sinus rhythm.  No ischemic changes.  Overall shared decision was made to pursue another MRI to see if there was a stroke.  Please see PA note for further results, evaluation, disposition of the patient.  This chart was dictated using voice recognition software.  Despite best efforts to proofread,  errors can occur which can change the documentation meaning.    EKG Interpretation  Date/Time:  Wednesday January 22 2022 07:02:29 EST Ventricular Rate:  82 PR Interval:  185 QRS Duration: 81 QT Interval:  393 QTC Calculation: 459 R Axis:   -29 Text Interpretation: Sinus rhythm Abnormal R-wave progression, early transition Confirmed by Lennice Sites ([id number]) on 01/22/2022 9:11:41 AM            Lennice Sites, DO 01/22/22 1007

## 2022-01-23 ENCOUNTER — Non-Acute Institutional Stay (SKILLED_NURSING_FACILITY): Payer: Medicare Other | Admitting: Nurse Practitioner

## 2022-01-23 ENCOUNTER — Encounter: Payer: Self-pay | Admitting: Nurse Practitioner

## 2022-01-23 ENCOUNTER — Encounter: Payer: Medicare Other | Admitting: Nurse Practitioner

## 2022-01-23 DIAGNOSIS — I10 Essential (primary) hypertension: Secondary | ICD-10-CM

## 2022-01-23 DIAGNOSIS — G309 Alzheimer's disease, unspecified: Secondary | ICD-10-CM

## 2022-01-23 DIAGNOSIS — E871 Hypo-osmolality and hyponatremia: Secondary | ICD-10-CM

## 2022-01-23 DIAGNOSIS — N1831 Chronic kidney disease, stage 3a: Secondary | ICD-10-CM

## 2022-01-23 DIAGNOSIS — F5101 Primary insomnia: Secondary | ICD-10-CM

## 2022-01-23 DIAGNOSIS — R55 Syncope and collapse: Secondary | ICD-10-CM

## 2022-01-23 DIAGNOSIS — M159 Polyosteoarthritis, unspecified: Secondary | ICD-10-CM

## 2022-01-23 DIAGNOSIS — F0150 Vascular dementia without behavioral disturbance: Secondary | ICD-10-CM

## 2022-01-23 DIAGNOSIS — F0280 Dementia in other diseases classified elsewhere without behavioral disturbance: Secondary | ICD-10-CM

## 2022-01-23 DIAGNOSIS — R350 Frequency of micturition: Secondary | ICD-10-CM

## 2022-01-23 DIAGNOSIS — E039 Hypothyroidism, unspecified: Secondary | ICD-10-CM

## 2022-01-23 DIAGNOSIS — J189 Pneumonia, unspecified organism: Secondary | ICD-10-CM

## 2022-01-23 DIAGNOSIS — K219 Gastro-esophageal reflux disease without esophagitis: Secondary | ICD-10-CM

## 2022-01-23 DIAGNOSIS — E785 Hyperlipidemia, unspecified: Secondary | ICD-10-CM

## 2022-01-23 DIAGNOSIS — I639 Cerebral infarction, unspecified: Secondary | ICD-10-CM | POA: Diagnosis not present

## 2022-01-23 DIAGNOSIS — I4892 Unspecified atrial flutter: Secondary | ICD-10-CM

## 2022-01-23 NOTE — Assessment & Plan Note (Signed)
SNF [facility] for supportive care.  

## 2022-01-23 NOTE — Assessment & Plan Note (Signed)
Bun/creat 21/1.23 01/22/22 

## 2022-01-23 NOTE — Assessment & Plan Note (Signed)
11/24/21 acute CVA left PCA embolic strokes, no apparent residual symptoms, on Eliquis f/u Neurology

## 2022-01-23 NOTE — Assessment & Plan Note (Signed)
off Ambien.

## 2022-01-23 NOTE — Assessment & Plan Note (Signed)
f/u Cardiology, off anticoagulation 2/2 falls in the past, restarted Eliquis after acute CVA 11/24/21 

## 2022-01-23 NOTE — Assessment & Plan Note (Signed)
on Atorvastatin, LDL 127 11/25/21 

## 2022-01-23 NOTE — Assessment & Plan Note (Signed)
no further recurrence since admitted to SNF [facility]

## 2022-01-23 NOTE — Assessment & Plan Note (Signed)
Blood pressure is controlled,  takes Hydralazine  

## 2022-01-23 NOTE — Assessment & Plan Note (Signed)
Na 138 01/22/22

## 2022-01-23 NOTE — Progress Notes (Addendum)
Location:  [facility] Room Number: NO/43/A Place of Service:  SNF (31) Provider:  Irva Loser X, NP  Patient Care Team: Anahli Arvanitis X, NP as PCP - General (Internal Medicine) Donato Heinz, MD as PCP - Cardiology (Cardiology) Brien Few, MD as Consulting Physician (Obstetrics and Gynecology) Marti Sleigh, MD as Consulting Physician (Gynecology) Nancy Marus, MD as Consulting Physician (Gynecologic Oncology)  Extended Emergency Contact Information Primary Emergency Contact: [facility] Address: [address]          [place], [place] [address] [name] of [place] Phone: [phone] Mobile Phone: [phone] Relation: Daughter  Code Status:  FULL Goals of care: Advanced Directive information    01/22/2022    7:00 AM  Advanced Directives  Does Patient Have a Medical Advance Directive? Yes  Type of Advance Directive [place]  Does patient want to make changes to medical advance directive? No - Patient declined  Copy of [place] in Chart? Yes - validated most recent copy scanned in chart (See row information)     Chief Complaint  Patient presents with   [facility] follow up for pneumonia     HPI:  Pt is a 88 y.o. female seen today for an acute visit for f/u ED eval 01/22/22 for PNA  01/22/22 presented to ED for lethargy, CXR showed left lower lung opacity, Doxycycline '100mg'$  bid x 7 days initiated. Non remarkable EKG< CT head, MR brain,  UA, CBC, CMP.    12/23/21 UTI, treated with Cipro '500mg'$  q12hrs x 7 days. CrCl>30              11/24/21 acute CVA left PCA embolic strokes, no apparent residual symptoms, on Eliquis f/u Neurology             CKD, Bun/creat 21/1.23 01/22/22             Hypothyroidism, takes Levothyroxine, TSH 3.5 05/27/21             Hyponatremia, Na 138 01/22/22             Syncope: Orthostasis ?, no further recurrence since admitted to SNF [facility]              A flutter, f/u Cardiology, off anticoagulation 2/2 falls in the past, restarted Eliquis after acute CVA 11/24/21             Hx of ovarian CA s/p TAH/BSO 03/2011, in remission.             vascular dementia,  SNF [facility] for supportive care.              Constipation takes Colace.              HTN, takes Hydralazine              OP, takes Ca, Vit D             Insomnia, off Ambien.              Hyperlipidemia, on Atorvastatin, LDL 127 11/25/21             OA, R ankle pain, uses ankle brace, takes Tylenol, w/c for mobility most of time.              Urinary frequency, on Mirabegron    Past Medical History:  Diagnosis Date   Anemia associated with acute blood loss 04/03/2011   Arthritis    knees  Ascites    Blood transfusion    hx of 8416    Complication of anesthesia    Sodium drops per pt    Cystitis    DIARRHEA, ANTIBIOTIC ASSOCIATED 05/31/2009   Qualifier: Diagnosis of  By: Tommy Medal MD, Roderic Scarce     GERD (gastroesophageal reflux disease)    H/O hiatal hernia    Hyperlipidemia    Hypertension    Hyponatremia    Neutropenia with fever ([place]) 05/18/2011   OSTEOMYELITIS, CHRONIC, LOWER LEG 04/23/2009   Qualifier: Diagnosis of  By: Johnnye Sima MD, Felecia Shelling    OSTEOPOROSIS 04/23/2009   Qualifier: Diagnosis of  By: Johnnye Sima MD, Jeffrey     Ovarian cancer ([place]) 01/25/2011   Pneumonia    hx of    Postmenopausal atrophic vaginitis    PROSTHETIC JOINT COMPLICATION 07/17/3014   Qualifier: Diagnosis of  By: Tommy Medal MD, Cornelius     Renal disorder    Decreased kidney function   Staph infection 2010   after knee replacement   Urinary frequency    Vulvitis    Past Surgical History:  Procedure Laterality Date   ABDOMINAL HYSTERECTOMY  04/01/2011   Procedure: HYSTERECTOMY ABDOMINAL;  Surgeon: Alvino Chapel, MD;  Location: [facility];  Service: Gynecology;  Laterality: N/A;   APPENDECTOMY     JOINT REPLACEMENT     R knee in 2008, 5 operations on L knee   LAPAROTOMY   04/01/2011   Procedure: EXPLORATORY LAPAROTOMY;  Surgeon: Alvino Chapel, MD;  Location: [facility];  Service: Gynecology;  Laterality: N/A;   OTHER SURGICAL HISTORY     hx of C section 1966   SALPINGOOPHORECTOMY  04/01/2011   Procedure: SALPINGO OOPHERECTOMY;  Surgeon: Alvino Chapel, MD;  Location: [facility];  Service: Gynecology;  Laterality: Bilateral;   TONSILLECTOMY      Allergies  Allergen Reactions   Morphine And Related Anaphylaxis and Other (See Comments)    Given after knee replacement and code blue occurred   Morphine Sulfate Anaphylaxis   Penicillins Hives, Itching and Other (See Comments)    Syncope, also   Nsaids Other (See Comments)    Because of an abnormal kidney test result   Penicillin V Hives   Shellfish Allergy Nausea And Vomiting and Other (See Comments)    Pt has shellfish allergy only.  Has had IV contrast x 2 and did fine.   Shellfish-Derived Products Nausea And Vomiting    Outpatient Encounter Medications as of 01/23/2022  Medication Sig   acetaminophen (TYLENOL) 500 MG tablet Take 1,000 mg by mouth every 8 (eight) hours as needed.   apixaban (ELIQUIS) 5 MG TABS tablet Take 1 tablet (5 mg total) by mouth 2 (two) times daily.   atorvastatin (LIPITOR) 40 MG tablet Take 1 tablet (40 mg total) by mouth daily.   azelastine (ASTELIN) 0.1 % nasal spray Place 1 spray into both nostrils daily as needed for rhinitis or allergies.   B Complex-C-Folic Acid (B-COMPLEX/FOLIC ACID/VITAMIN C PO) Take 1 tablet by mouth daily with breakfast.   bimatoprost (LUMIGAN) 0.03 % ophthalmic solution Place 1 drop into both eyes at bedtime.   calcium carbonate (TUMS EX) 750 MG chewable tablet Chew 750 mg by mouth daily as needed for heartburn.   Cholecalciferol (VITAMIN D3) 50 MCG (2000 UT) TABS Take 2,000 Units by mouth in the morning.   cycloSPORINE (RESTASIS) 0.05 % ophthalmic emulsion Place 1 drop into both eyes 2 (two) times daily.  diclofenac Sodium (VOLTAREN) 1 %  GEL Apply 2 g topically in the morning and at bedtime.   docusate sodium (COLACE) 100 MG capsule Take 100 mg by mouth daily.   [EXPIRED] doxycycline (VIBRAMYCIN) 100 MG capsule Take 1 capsule (100 mg total) by mouth 2 (two) times daily for 7 days.   hydrALAZINE (APRESOLINE) 10 MG tablet Take 10 mg by mouth 2 (two) times daily as needed.   levothyroxine (SYNTHROID) 25 MCG tablet Take 25 mcg by mouth daily before breakfast.   meclizine (ANTIVERT) 12.5 MG tablet Take 1 tablet (12.5 mg total) by mouth 2 (two) times daily as needed for dizziness.   melatonin 3 MG TABS tablet Take 1 tablet (3 mg total) by mouth at bedtime.   miconazole (MICOTIN) 2 % powder Apply topically daily. Patient may apply to dry skin under breasts daily and after showering   mirabegron ER (MYRBETRIQ) 25 MG TB24 tablet Take 25 mg by mouth daily.   nystatin (MYCOSTATIN/NYSTOP) powder Apply 1 application topically 2 (two) times daily as needed.   omeprazole (PRILOSEC) 20 MG capsule Take 20 mg by mouth daily.   sodium chloride 1 g tablet Take 1 g by mouth 2 (two) times daily.   zinc oxide 20 % ointment Apply 1 application topically as needed (for redness- to be applied to buttocks/peri area and after every incontinent episode).   No facility-administered encounter medications on file as of 01/23/2022.    Review of Systems  Constitutional:  Positive for fatigue. Negative for appetite change and fever.  HENT:  Positive for hearing loss. Negative for congestion and trouble swallowing.   Eyes:  Negative for visual disturbance.  Respiratory:  Negative for cough and shortness of breath.   Cardiovascular:  Negative for leg swelling.  Gastrointestinal:  Negative for abdominal pain, constipation, nausea and vomiting.  Genitourinary:  Positive for frequency. Negative for difficulty urinating, dysuria, flank pain and urgency.  Musculoskeletal:  Positive for arthralgias, back pain and gait problem.       R ankle pain.   Skin:  Negative  for color change.  Neurological:  Negative for speech difficulty, weakness and light-headedness.  Psychiatric/Behavioral:  Negative for confusion and sleep disturbance. The patient is not nervous/anxious.     Immunization History  Administered Date(s) Administered   Influenza Split 11/13/2012, 12/13/2012, 11/30/2013, 10/09/2014, 11/24/2018   Influenza, High Dose Seasonal PF 12/01/2013, 10/04/2014, 10/12/2015, 11/06/2016, 11/12/2017   Influenza,inj,Quad PF,6+ Mos 10/24/2010   Influenza-Unspecified 10/24/2010, 11/01/2019, 12/04/2020   Moderna Covid-19 Vaccine Bivalent Booster 83yr & up 07/24/2021   Moderna SARS-COV2 Booster Vaccination 07/18/2020   Moderna Sars-Covid-2 Vaccination 02/14/2019, 03/14/2019, 12/26/2019   PFIZER(Purple Top)SARS-COV-2 Vaccination 10/31/2020   PPD Test 11/27/2021   Pfizer Covid-19 Vaccine Bivalent Booster 169yr& up 10/31/2020   Pneumococcal Conjugate-13 09/28/2013   Pneumococcal Polysaccharide-23 03/05/2012   Td 10/16/2010   Tdap 08/27/2000, 10/16/2010   Zoster, Live 01/11/2013, 03/31/2020   Pertinent  Health Maintenance Due  Topic Date Due   INFLUENZA VACCINE  09/10/2021   DEXA SCAN  Completed      11/27/2021   10:24 AM 11/29/2021    2:37 PM 12/10/2021    9:16 AM 01/09/2022   12:58 PM 01/22/2022    7:00 AM  [place] the past year?  0 0 0   Was there an injury with Fall?  0 0 1   Fall Risk Category Calculator  0 0 1   Fall Risk Category  Low Low Low   Patient  Fall Risk Level High fall risk Low fall risk High fall risk High fall risk High fall risk  Patient at Risk for Falls Due to  No Fall Risks History of fall(s);Impaired balance/gait;Impaired mobility;Mental status change History of fall(s);Impaired balance/gait;Impaired mobility;Mental status change   Fall risk Follow up  Falls evaluation completed Falls evaluation completed Falls evaluation completed    Functional Status Survey:    Vitals:   01/23/22 0928  BP: 115/68  Pulse:  86  Resp: 17  Temp: 97.6 F (36.4 C)  SpO2: 96%  Weight: 192 lb 12.8 oz (87.5 kg)  Height: '5\' 3"'$  (1.6 m)   Body mass index is 34.15 kg/m. Physical Exam Vitals and nursing note reviewed.  Constitutional:      Comments: More tired than usual  HENT:     Head: Normocephalic and atraumatic.     Mouth/Throat:     Mouth: Mucous membranes are moist.  Eyes:     Extraocular Movements: Extraocular movements intact.     Conjunctiva/sclera: Conjunctivae normal.     Pupils: Pupils are equal, round, and reactive to light.  Cardiovascular:     Rate and Rhythm: Normal rate. Rhythm irregular.     Heart sounds: No murmur heard. Pulmonary:     Effort: Pulmonary effort is normal.     Breath sounds: No wheezing, rhonchi or rales.  Abdominal:     General: Bowel sounds are normal.     Palpations: Abdomen is soft.     Tenderness: There is no abdominal tenderness.     Hernia: A hernia is present.     Comments: Incision hernia  Musculoskeletal:        General: Tenderness present.     Cervical back: Normal range of motion and neck supple.     Right lower leg: No edema.     Left lower leg: No edema.     Comments: Right ankle brace, chronic pain.  Skin:    General: Skin is warm and dry.  Neurological:     General: No focal deficit present.     Mental Status: She is alert and oriented to person, place, and time. Mental status is at baseline.     Motor: No weakness.     Gait: Gait abnormal.  Psychiatric:        Mood and Affect: Mood normal.        Behavior: Behavior normal.        Thought Content: Thought content normal.     Labs reviewed: Recent Labs    11/24/21 1023 11/26/21 0420 01/22/22 0925  NA 135 135 138  K 3.9 3.7 4.5  CL 101 104 104  CO2 '26 23 25  '$ GLUCOSE 122* 109* 125*  BUN 22 29* 21  CREATININE 1.20* 1.23* 1.23*  CALCIUM 9.5 8.7* 9.3   Recent Labs    05/27/21 0000 05/27/21 1244 11/24/21 1023 01/22/22 0925  AST  --  12* 23 26  ALT  --  '18 16 16  '$ ALKPHOS  --  63  67 68  BILITOT  --   --  0.6 0.6  PROT  --   --  8.5* 7.1  ALBUMIN 3.4*  --  4.2 3.5   Recent Labs    09/12/21 0000 11/24/21 1023 11/26/21 0420 01/22/22 0925  WBC 6.5 8.2 8.9 7.8  NEUTROABS 4,381.00 6.6  --  6.3  HGB 13.9 13.9 12.7 12.6  HCT 43 44.4 40.1 40.6  MCV  --  84.6 84.2 84.1  PLT 212  237 202 191   Lab Results  Component Value Date   TSH 3.50 03/28/2021   Lab Results  Component Value Date   HGBA1C 5.7 (H) 11/24/2021   Lab Results  Component Value Date   CHOL 181 11/25/2021   HDL 37 (L) 11/25/2021   LDLCALC 127 (H) 11/25/2021   TRIG 85 11/25/2021   CHOLHDL 4.9 11/25/2021    Significant Diagnostic Results in last 30 days:  MR BRAIN WO CONTRAST  Result Date: 01/22/2022 CLINICAL DATA:  Acute neuro deficit.  Rule out stroke EXAM: MRI HEAD WITHOUT CONTRAST TECHNIQUE: Multiplanar, multiecho pulse sequences of the brain and surrounding structures were obtained without intravenous contrast. COMPARISON:  CT head 01/22/2022.  MRI head 11/24/2021 FINDINGS: Brain: Negative for acute infarct Moderate white matter changes with confluent and diffuse T2 and FLAIR hyperintensity throughout the white matter. Chronic lacunar infarcts are present in the basal ganglia bilaterally. Mild chronic ischemic change in the pons. Chronic small infarct right cerebellum. Small chronic cortical infarct in the left posterior parietal lobe with laminar necrosis. This area shows restricted diffusion on the MRI of 11/24/2021. Ventricle size normal. Generalized atrophy. Vascular: Normal arterial flow voids. Skull and upper cervical spine: No focal abnormality. Sinuses/Orbits: Mild mucosal edema paranasal sinuses. Left cataract extraction. No orbital mass Other: None IMPRESSION: 1. Negative for acute infarct. 2. Moderate atrophy.  Moderate chronic ischemic changes. Electronically Signed   By: Franchot Gallo M.D.   On: 01/22/2022 11:55   CT Head Wo Contrast  Result Date: 01/22/2022 CLINICAL DATA:   Delirium.  Altered mental status EXAM: CT HEAD WITHOUT CONTRAST TECHNIQUE: Contiguous axial images were obtained from the base of the skull through the vertex without intravenous contrast. RADIATION DOSE REDUCTION: This exam was performed according to the departmental dose-optimization program which includes automated exposure control, adjustment of the mA and/or kV according to patient size and/or use of iterative reconstruction technique. COMPARISON:  None Available. FINDINGS: Brain: No acute intracranial hemorrhage. No focal mass lesion. No CT evidence of acute infarction. No midline shift or mass effect. No hydrocephalus. Basilar cisterns are patent. There are periventricular and subcortical white matter hypodensities. Generalized cortical atrophy. Vascular: No hyperdense vessel or unexpected calcification. Skull: Normal. Negative for fracture or focal lesion. Sinuses/Orbits: Paranasal sinuses and mastoid air cells are clear. Orbits are clear. Other: None. IMPRESSION: 1. No acute intracranial findings. 2. Atrophy and white matter microvascular disease Electronically Signed   By: Suzy Bouchard M.D.   On: 01/22/2022 07:55   DG Chest Portable 1 View  Result Date: 01/22/2022 CLINICAL DATA:  Altered mental status. EXAM: PORTABLE CHEST 1 VIEW COMPARISON:  Portable chest 04/18/2020 FINDINGS: There is patchy opacity in the left lower lung field concerning for consolidated pneumonia, possibly of the lingula as there is silhouetting of the lower left heart border. The remainder of the lungs are clear. The heart is slightly enlarged and no vascular congestion is seen. There is moderate calcification of the aorta with stable mediastinal configuration. There is osteopenia, thoracic spondylosis and slight dextroscoliosis. No appreciable pleural effusion. IMPRESSION: Patchy opacity in the left lower lung field concerning for consolidated pneumonia. Clinical correlation and radiographic follow-up recommended.  Electronically Signed   By: Telford Nab M.D.   On: 01/22/2022 07:07    Assessment/Plan Stroke (cerebrum) ([place]) 11/24/21 acute CVA left PCA embolic strokes, no apparent residual symptoms, on Eliquis f/u Neurology  CKD (chronic kidney disease) stage 3, GFR 30-59 ml/min ([facility]) Bun/creat 21/1.23 01/22/22  Hypothyroidism takes Levothyroxine, TSH 3.5 05/27/21  Hyponatremia Na 138 01/22/22  Syncope and collapse no further recurrence since admitted to SNF [facility]  Atrial flutter [facility]) f/u Cardiology, off anticoagulation 2/2 falls in the past, restarted Eliquis after acute CVA 11/24/21  Mixed Alzheimer's and vascular dementia ([place]) SNF [facility] for supportive care.   Essential hypertension Blood pressure is controlled,  takes Hydralazine   Insomnia  off Ambien.   HLD (hyperlipidemia) on Atorvastatin, LDL 127 11/25/21  Osteoarthritis R ankle pain, uses ankle brace, takes Tylenol, w/c for mobility most of time.   Urinary frequency on Mirabegron    Community acquired pneumonia of left lower lobe of lung CXR showed left lower lung opacity, Doxycycline '100mg'$  bid x 7 days initiated. Non remarkable EKG< CT head, MR brain,  UA, CBC, CMP.  observe  GERD 02/11/21 thin liquids, Fees significant reflux, dc Omeprazole, try Pantoprazole '40mg'$  qd.      Family/ staff Communication: plan of care reviewed with the patient and charge nurse.   Labs/tests ordered: none  Time spend 35 minutes.

## 2022-01-23 NOTE — Assessment & Plan Note (Signed)
on Mirabegron

## 2022-01-23 NOTE — Assessment & Plan Note (Signed)
takes Levothyroxine, TSH 3.5 05/27/21

## 2022-01-23 NOTE — Assessment & Plan Note (Signed)
R ankle pain, uses ankle brace, takes Tylenol, w/c for mobility most of time.  

## 2022-01-23 NOTE — Assessment & Plan Note (Signed)
CXR showed left lower lung opacity, Doxycycline '100mg'$  bid x 7 days initiated. Non remarkable EKG< CT head, MR brain,  UA, CBC, CMP.  observe

## 2022-01-30 ENCOUNTER — Encounter: Payer: Medicare Other | Admitting: Nurse Practitioner

## 2022-02-11 NOTE — Assessment & Plan Note (Addendum)
02/11/21 thin liquids, Fees significant reflux, dc Omeprazole, try Pantoprazole '40mg'$  qd.

## 2022-02-12 ENCOUNTER — Non-Acute Institutional Stay (SKILLED_NURSING_FACILITY): Payer: Medicare Other | Admitting: Adult Health

## 2022-02-12 ENCOUNTER — Encounter: Payer: Self-pay | Admitting: Adult Health

## 2022-02-12 DIAGNOSIS — H1012 Acute atopic conjunctivitis, left eye: Secondary | ICD-10-CM | POA: Diagnosis not present

## 2022-02-12 NOTE — Progress Notes (Signed)
Location:  [facility] Room Number: NO43/A Place of Service:  SNF [phone]) Provider:  Nickola Major, FNP  Patient Care Team: Mast, Man X, NP as PCP - General (Internal Medicine) Donato Heinz, MD as PCP - Cardiology (Cardiology) Brien Few, MD as Consulting Physician (Obstetrics and Gynecology) Marti Sleigh, MD as Consulting Physician (Gynecology) Nancy Marus, MD as Consulting Physician (Gynecologic Oncology)  Extended Emergency Contact Information Primary Emergency Contact: [facility] Address: [address]          [place], [place] [address] [name] of [place] Phone: [phone] Mobile Phone: [phone] Relation: Daughter  Code Status:  Full Code Goals of care: Advanced Directive information    02/12/2022    4:36 PM  Advanced Directives  Does Patient Have a Medical Advance Directive? Yes  Type of Paramedic of [place];Living will  Does patient want to make changes to medical advance directive? No - Patient declined  Copy of [place] in Chart? Yes - validated most recent copy scanned in chart (See row information)     Chief Complaint  Patient presents with   Acute Visit    Patient is having left eye drainage and itching.     HPI:  Pt is a 89 y.o. female seen today for an acute visit for left eye drainage and itching. She just returned to her room after going out to the dentist and eating lunch wit her son who visited from [place]. She complained of left eye itching. Noted left eyelid with slight erythema. Staff reported that she was noted to have yellowish left eye drainage that comes back after wiping eye. She denies pain nor vision change.   Past Medical History:  Diagnosis Date   Anemia associated with acute blood loss 04/03/2011   Arthritis    knees    Ascites    Blood transfusion    hx of 2505    Complication of anesthesia    Sodium drops per  pt    Cystitis    DIARRHEA, ANTIBIOTIC ASSOCIATED 05/31/2009   Qualifier: Diagnosis of  By: Tommy Medal MD, Cornelius     GERD (gastroesophageal reflux disease)    H/O hiatal hernia    Hyperlipidemia    Hypertension    Hyponatremia    Neutropenia with fever ([place]) 05/18/2011   OSTEOMYELITIS, CHRONIC, LOWER LEG 04/23/2009   Qualifier: Diagnosis of  By: Johnnye Sima MD, Felecia Shelling    OSTEOPOROSIS 04/23/2009   Qualifier: Diagnosis of  By: Johnnye Sima MD, Jeffrey     Ovarian cancer ([place]) 01/25/2011   Pneumonia    hx of    Postmenopausal atrophic vaginitis    PROSTHETIC JOINT COMPLICATION 3/97/6734   Qualifier: Diagnosis of  By: Tommy Medal MD, Cornelius     Renal disorder    Decreased kidney function   Staph infection 2010   after knee replacement   Urinary frequency    Vulvitis    Past Surgical History:  Procedure Laterality Date   ABDOMINAL HYSTERECTOMY  04/01/2011   Procedure: HYSTERECTOMY ABDOMINAL;  Surgeon: Alvino Chapel, MD;  Location: [facility];  Service: Gynecology;  Laterality: N/A;   APPENDECTOMY     JOINT REPLACEMENT     R knee in 2008, 5 operations on L knee   LAPAROTOMY  04/01/2011   Procedure: EXPLORATORY LAPAROTOMY;  Surgeon: Alvino Chapel, MD;  Location: [facility];  Service: Gynecology;  Laterality: N/A;   OTHER SURGICAL HISTORY  hx of C section 1966   SALPINGOOPHORECTOMY  04/01/2011   Procedure: SALPINGO OOPHERECTOMY;  Surgeon: Alvino Chapel, MD;  Location: [facility];  Service: Gynecology;  Laterality: Bilateral;   TONSILLECTOMY      Allergies  Allergen Reactions   Morphine And Related Anaphylaxis and Other (See Comments)    Given after knee replacement and code blue occurred   Morphine Sulfate Anaphylaxis   Penicillins Hives, Itching and Other (See Comments)    Syncope, also   Nsaids Other (See Comments)    Because of an abnormal kidney test result   Penicillin V Hives   Shellfish Allergy Nausea And Vomiting and Other (See Comments)     Pt has shellfish allergy only.  Has had IV contrast x 2 and did fine.   Shellfish-Derived Products Nausea And Vomiting    Outpatient Encounter Medications as of 02/12/2022  Medication Sig   acetaminophen (TYLENOL) 500 MG tablet Take 1,000 mg by mouth every 8 (eight) hours as needed.   apixaban (ELIQUIS) 5 MG TABS tablet Take 1 tablet (5 mg total) by mouth 2 (two) times daily.   atorvastatin (LIPITOR) 40 MG tablet Take 1 tablet (40 mg total) by mouth daily.   azelastine (ASTELIN) 0.1 % nasal spray Place 1 spray into both nostrils daily as needed for rhinitis or allergies.   B Complex-C-Folic Acid (B-COMPLEX/FOLIC ACID/VITAMIN C PO) Take 1 tablet by mouth daily with breakfast.   bimatoprost (LUMIGAN) 0.03 % ophthalmic solution Place 1 drop into both eyes at bedtime.   calcium carbonate (TUMS EX) 750 MG chewable tablet Chew 750 mg by mouth daily as needed for heartburn.   Cholecalciferol (VITAMIN D3) 50 MCG (2000 UT) TABS Take 2,000 Units by mouth in the morning.   cycloSPORINE (RESTASIS) 0.05 % ophthalmic emulsion Place 1 drop into both eyes 2 (two) times daily.   diclofenac Sodium (VOLTAREN) 1 % GEL Apply 2 g topically in the morning and at bedtime.   docusate sodium (COLACE) 100 MG capsule Take 100 mg by mouth daily.   hydrALAZINE (APRESOLINE) 10 MG tablet Take 10 mg by mouth 2 (two) times daily as needed.   levothyroxine (SYNTHROID) 25 MCG tablet Take 25 mcg by mouth daily before breakfast.   meclizine (ANTIVERT) 12.5 MG tablet Take 1 tablet (12.5 mg total) by mouth 2 (two) times daily as needed for dizziness.   melatonin 3 MG TABS tablet Take 1 tablet (3 mg total) by mouth at bedtime.   miconazole (MICOTIN) 2 % powder Apply topically daily. Patient may apply to dry skin under breasts daily and after showering   mirabegron ER (MYRBETRIQ) 25 MG TB24 tablet Take 25 mg by mouth daily.   nystatin (MYCOSTATIN/NYSTOP) powder Apply 1 application topically 2 (two) times daily as needed.   omeprazole  (PRILOSEC) 20 MG capsule Take 20 mg by mouth daily.   sodium chloride 1 g tablet Take 1 g by mouth 2 (two) times daily.   zinc oxide 20 % ointment Apply 1 application topically as needed (for redness- to be applied to buttocks/peri area and after every incontinent episode).   No facility-administered encounter medications on file as of 02/12/2022.    Review of Systems  Constitutional:  Negative for appetite change, chills, fatigue and fever.  HENT:  Negative for congestion, hearing loss, rhinorrhea and sore throat.   Eyes:  Positive for discharge, redness and itching. Negative for pain and visual disturbance.  Respiratory:  Negative for cough, shortness of breath and wheezing.   Cardiovascular:  Negative for chest pain, palpitations and leg swelling.  Gastrointestinal:  Negative for abdominal pain, constipation, diarrhea, nausea and vomiting.  Genitourinary:  Negative for dysuria.  Musculoskeletal:  Negative for arthralgias, back pain and myalgias.  Skin:  Negative for color change, rash and wound.  Neurological:  Negative for dizziness, weakness and headaches.  Psychiatric/Behavioral:  Negative for behavioral problems. The patient is not nervous/anxious.     Immunization History  Administered Date(s) Administered   Influenza Split 11/13/2012, 12/13/2012, 11/30/2013, 10/09/2014, 11/24/2018   Influenza, High Dose Seasonal PF 12/01/2013, 10/04/2014, 10/12/2015, 11/06/2016, 11/12/2017   Influenza,inj,Quad PF,6+ Mos 10/24/2010   Influenza-Unspecified 10/24/2010, 11/01/2019, 12/04/2020   Moderna Covid-19 Vaccine Bivalent Booster 69yr & up 07/24/2021   Moderna SARS-COV2 Booster Vaccination 07/18/2020   Moderna Sars-Covid-2 Vaccination 02/14/2019, 03/14/2019, 12/26/2019   PFIZER(Purple Top)SARS-COV-2 Vaccination 10/31/2020   PPD Test 11/27/2021   Pfizer Covid-19 Vaccine Bivalent Booster 129yr& up 10/31/2020   Pneumococcal Conjugate-13 09/28/2013   Pneumococcal Polysaccharide-23 03/05/2012    Td 10/16/2010   Tdap 08/27/2000, 10/16/2010   Zoster, Live 01/11/2013, 03/31/2020   Pertinent  Health Maintenance Due  Topic Date Due   INFLUENZA VACCINE  09/10/2021   DEXA SCAN  Completed      11/29/2021    2:37 PM 12/10/2021    9:16 AM 01/09/2022   12:58 PM 01/22/2022    7:00 AM 02/12/2022    4:30 PM  Fall Risk  Falls in the past year? 0 0 0  0  Was there an injury with Fall? 0 0 1  0  Fall Risk Category Calculator 0 0 1  0  Fall Risk Category Low Low Low  Low  Patient Fall Risk Level Low fall risk High fall risk High fall risk High fall risk High fall risk  Patient at Risk for Falls Due to No Fall Risks History of fall(s);Impaired balance/gait;Impaired mobility;Mental status change History of fall(s);Impaired balance/gait;Impaired mobility;Mental status change  History of fall(s);Impaired balance/gait;Impaired mobility;Mental status change  Fall risk Follow up Falls evaluation completed Falls evaluation completed Falls evaluation completed  Falls evaluation completed   Functional Status Survey:    Vitals:   02/12/22 1627  BP: (!) 156/76  Pulse: 96  Resp: 17  Temp: (!) 97.3 F (36.3 C)  SpO2: 96%  Weight: 191 lb 8 oz (86.9 kg)  Height: '5\' 3"'$  (1.6 m)   Body mass index is 33.92 kg/m. Physical Exam Constitutional:      Appearance: She is obese.  HENT:     Head: Normocephalic and atraumatic.     Nose: Nose normal.     Mouth/Throat:     Mouth: Mucous membranes are moist.  Eyes:     Conjunctiva/sclera: Conjunctivae normal.  Cardiovascular:     Rate and Rhythm: Normal rate and regular rhythm.  Pulmonary:     Effort: Pulmonary effort is normal.     Breath sounds: Normal breath sounds.  Abdominal:     General: Bowel sounds are normal.     Palpations: Abdomen is soft.  Musculoskeletal:        General: Normal range of motion.     Cervical back: Normal range of motion.  Skin:    General: Skin is warm and dry.  Neurological:     General: No focal deficit  present.     Mental Status: She is alert and oriented to person, place, and time.  Psychiatric:        Mood and Affect: Mood normal.  Behavior: Behavior normal.        Thought Content: Thought content normal.        Judgment: Judgment normal.     Labs reviewed: Recent Labs    11/24/21 1023 11/26/21 0420 01/22/22 0925  NA 135 135 138  K 3.9 3.7 4.5  CL 101 104 104  CO2 '26 23 25  '$ GLUCOSE 122* 109* 125*  BUN 22 29* 21  CREATININE 1.20* 1.23* 1.23*  CALCIUM 9.5 8.7* 9.3   Recent Labs    05/27/21 0000 05/27/21 1244 11/24/21 1023 01/22/22 0925  AST  --  12* 23 26  ALT  --  '18 16 16  '$ ALKPHOS  --  63 67 68  BILITOT  --   --  0.6 0.6  PROT  --   --  8.5* 7.1  ALBUMIN 3.4*  --  4.2 3.5   Recent Labs    09/12/21 0000 11/24/21 1023 11/26/21 0420 01/22/22 0925  WBC 6.5 8.2 8.9 7.8  NEUTROABS 4,381.00 6.6  --  6.3  HGB 13.9 13.9 12.7 12.6  HCT 43 44.4 40.1 40.6  MCV  --  84.6 84.2 84.1  PLT 212 237 202 191   Lab Results  Component Value Date   TSH 3.50 03/28/2021   Lab Results  Component Value Date   HGBA1C 5.7 (H) 11/24/2021   Lab Results  Component Value Date   CHOL 181 11/25/2021   HDL 37 (L) 11/25/2021   LDLCALC 127 (H) 11/25/2021   TRIG 85 11/25/2021   CHOLHDL 4.9 11/25/2021    Significant Diagnostic Results in last 30 days:  MR BRAIN WO CONTRAST  Result Date: 01/22/2022 CLINICAL DATA:  Acute neuro deficit.  Rule out stroke EXAM: MRI HEAD WITHOUT CONTRAST TECHNIQUE: Multiplanar, multiecho pulse sequences of the brain and surrounding structures were obtained without intravenous contrast. COMPARISON:  CT head 01/22/2022.  MRI head 11/24/2021 FINDINGS: Brain: Negative for acute infarct Moderate white matter changes with confluent and diffuse T2 and FLAIR hyperintensity throughout the white matter. Chronic lacunar infarcts are present in the basal ganglia bilaterally. Mild chronic ischemic change in the pons. Chronic small infarct right cerebellum.  Small chronic cortical infarct in the left posterior parietal lobe with laminar necrosis. This area shows restricted diffusion on the MRI of 11/24/2021. Ventricle size normal. Generalized atrophy. Vascular: Normal arterial flow voids. Skull and upper cervical spine: No focal abnormality. Sinuses/Orbits: Mild mucosal edema paranasal sinuses. Left cataract extraction. No orbital mass Other: None IMPRESSION: 1. Negative for acute infarct. 2. Moderate atrophy.  Moderate chronic ischemic changes. Electronically Signed   By: Franchot Gallo M.D.   On: 01/22/2022 11:55   CT Head Wo Contrast  Result Date: 01/22/2022 CLINICAL DATA:  Delirium.  Altered mental status EXAM: CT HEAD WITHOUT CONTRAST TECHNIQUE: Contiguous axial images were obtained from the base of the skull through the vertex without intravenous contrast. RADIATION DOSE REDUCTION: This exam was performed according to the departmental dose-optimization program which includes automated exposure control, adjustment of the mA and/or kV according to patient size and/or use of iterative reconstruction technique. COMPARISON:  None Available. FINDINGS: Brain: No acute intracranial hemorrhage. No focal mass lesion. No CT evidence of acute infarction. No midline shift or mass effect. No hydrocephalus. Basilar cisterns are patent. There are periventricular and subcortical white matter hypodensities. Generalized cortical atrophy. Vascular: No hyperdense vessel or unexpected calcification. Skull: Normal. Negative for fracture or focal lesion. Sinuses/Orbits: Paranasal sinuses and mastoid air cells are clear. Orbits are clear. Other: None.  IMPRESSION: 1. No acute intracranial findings. 2. Atrophy and white matter microvascular disease Electronically Signed   By: Suzy Bouchard M.D.   On: 01/22/2022 07:55   DG Chest Portable 1 View  Result Date: 01/22/2022 CLINICAL DATA:  Altered mental status. EXAM: PORTABLE CHEST 1 VIEW COMPARISON:  Portable chest 04/18/2020  FINDINGS: There is patchy opacity in the left lower lung field concerning for consolidated pneumonia, possibly of the lingula as there is silhouetting of the lower left heart border. The remainder of the lungs are clear. The heart is slightly enlarged and no vascular congestion is seen. There is moderate calcification of the aorta with stable mediastinal configuration. There is osteopenia, thoracic spondylosis and slight dextroscoliosis. No appreciable pleural effusion. IMPRESSION: Patchy opacity in the left lower lung field concerning for consolidated pneumonia. Clinical correlation and radiographic follow-up recommended. Electronically Signed   By: Telford Nab M.D.   On: 01/22/2022 07:07    Assessment/Plan  1. Allergic conjunctivitis of left eye -  will start on [place] 0.01% instill 1 drop to left eye BID X 2 weeks -  keeps eyes clean -  instructed patient to wash hands before touching her eyes  Family/ staff Communication:  Discussed plan of care with patient and charge nurse. -  Updated daughter, Almyra Free, over the phone  Labs/tests ordered:   None

## 2022-02-13 ENCOUNTER — Ambulatory Visit: Payer: Medicare Other | Admitting: Physician Assistant

## 2022-02-25 ENCOUNTER — Non-Acute Institutional Stay (SKILLED_NURSING_FACILITY): Payer: Medicare Other | Admitting: Family Medicine

## 2022-02-25 DIAGNOSIS — I10 Essential (primary) hypertension: Secondary | ICD-10-CM

## 2022-02-25 DIAGNOSIS — G309 Alzheimer's disease, unspecified: Secondary | ICD-10-CM

## 2022-02-25 DIAGNOSIS — F0150 Vascular dementia without behavioral disturbance: Secondary | ICD-10-CM

## 2022-02-25 DIAGNOSIS — I700 Atherosclerosis of aorta: Secondary | ICD-10-CM

## 2022-02-25 DIAGNOSIS — E785 Hyperlipidemia, unspecified: Secondary | ICD-10-CM | POA: Diagnosis not present

## 2022-02-25 DIAGNOSIS — I639 Cerebral infarction, unspecified: Secondary | ICD-10-CM | POA: Diagnosis not present

## 2022-02-25 DIAGNOSIS — M810 Age-related osteoporosis without current pathological fracture: Secondary | ICD-10-CM

## 2022-02-25 DIAGNOSIS — F0280 Dementia in other diseases classified elsewhere without behavioral disturbance: Secondary | ICD-10-CM

## 2022-02-25 NOTE — Progress Notes (Signed)
Provider:  Alain Honey, MD Location:      Place of Service:     PCP: Mast, Man X, NP Patient Care Team: Mast, Man X, NP as PCP - General (Internal Medicine) Donato Heinz, MD as PCP - Cardiology (Cardiology) Brien Few, MD as Consulting Physician (Obstetrics and Gynecology) Marti Sleigh, MD as Consulting Physician (Gynecology) Nancy Marus, MD as Consulting Physician (Gynecologic Oncology)  Extended Emergency Contact Information Primary Emergency Contact: [facility] Address: [address]          [place], [place] [address] [name] of [place] Phone: [phone] Mobile Phone: [phone] Relation: Daughter  Code Status:  Goals of Care: Advanced Directive information    02/12/2022    4:36 PM  Advanced Directives  Does Patient Have a Medical Advance Directive? Yes  Type of Paramedic of [place];Living will  Does patient want to make changes to medical advance directive? No - Patient declined  Copy of [place] in Chart? Yes - validated most recent copy scanned in chart (See row information)      No chief complaint on file.   HPI: Patient is a 89 y.o. female seen today for medical management of chronic problems including vascular dementia, hypertension, osteoporosis, hyperlipidemia, and status postacute left PCA embolic stroke. She had moved here after hospitalization.  She had previously been a resident at friend's home when asked, assisted living, it appears that she is here now long-term.  She cannot tell me that but looking around her room there are many pictures on the wall and it has the appearance more of home.  This is likely the handiwork of her daughter who lives here in [place]. She gets around mostly via Transport planner.  She tells me that she still uses her walker some. No complaints of anorexia, insomnia but occasional constipation. She does recall some events from the past  like when we took a church trip as part of a group back several years ago but he on the other hand she does not really remember that she plans to stay here indefinitely.  He also does not remember that she has a diagnosis of atrial flutter.  Past Medical History:  Diagnosis Date   Anemia associated with acute blood loss 04/03/2011   Arthritis    knees    Ascites    Blood transfusion    hx of 6294    Complication of anesthesia    Sodium drops per pt    Cystitis    DIARRHEA, ANTIBIOTIC ASSOCIATED 05/31/2009   Qualifier: Diagnosis of  By: Tommy Medal MD, Cornelius     GERD (gastroesophageal reflux disease)    H/O hiatal hernia    Hyperlipidemia    Hypertension    Hyponatremia    Neutropenia with fever ([place]) 05/18/2011   OSTEOMYELITIS, CHRONIC, LOWER LEG 04/23/2009   Qualifier: Diagnosis of  By: Johnnye Sima MD, Felecia Shelling    OSTEOPOROSIS 04/23/2009   Qualifier: Diagnosis of  By: Johnnye Sima MD, Jeffrey     Ovarian cancer ([place]) 01/25/2011   Pneumonia    hx of    Postmenopausal atrophic vaginitis    PROSTHETIC JOINT COMPLICATION 7/65/4650   Qualifier: Diagnosis of  By: Tommy Medal MD, Cornelius     Renal disorder    Decreased kidney function   Staph infection 2010   after knee replacement   Urinary frequency    Vulvitis    Past Surgical History:  Procedure Laterality Date  ABDOMINAL HYSTERECTOMY  04/01/2011   Procedure: HYSTERECTOMY ABDOMINAL;  Surgeon: Alvino Chapel, MD;  Location: [facility];  Service: Gynecology;  Laterality: N/A;   APPENDECTOMY     JOINT REPLACEMENT     R knee in 2008, 5 operations on L knee   LAPAROTOMY  04/01/2011   Procedure: EXPLORATORY LAPAROTOMY;  Surgeon: Alvino Chapel, MD;  Location: [facility];  Service: Gynecology;  Laterality: N/A;   OTHER SURGICAL HISTORY     hx of C section 1966   SALPINGOOPHORECTOMY  04/01/2011   Procedure: SALPINGO OOPHERECTOMY;  Surgeon: Alvino Chapel, MD;  Location: [facility];  Service: Gynecology;  Laterality:  Bilateral;   TONSILLECTOMY      reports that she quit smoking about 70 years ago. Her smoking use included cigarettes. She has never used smokeless tobacco. She reports that she does not currently use alcohol. She reports that she does not use drugs. Social History   Socioeconomic History   Marital status: Widowed    Spouse name: Not on file   Number of children: Not on file   Years of education: Not on file   Highest education level: Not on file  Occupational History   Not on file  Tobacco Use   Smoking status: Former    Types: Cigarettes    Quit date: 02/11/1952    Years since quitting: 70.0   Smokeless tobacco: Never  Vaping Use   Vaping Use: Never used  Substance and Sexual Activity   Alcohol use: Not Currently   Drug use: Never   Sexual activity: Never  Other Topics Concern   Not on file  Social History Narrative   Not on file   Social Determinants of Health   Financial Resource Strain: Low Risk  (08/23/2021)   Overall Financial Resource Strain (CARDIA)    Difficulty of Paying Living Expenses: Not hard at all  Food Insecurity: No Food Insecurity (11/26/2021)   Hunger Vital Sign    Worried About Running Out of Food in the Last Year: Never true    [place] in the Last Year: Never true  Transportation Needs: No Transportation Needs (11/26/2021)   PRAPARE - Hydrologist (Medical): No    Lack of Transportation (Non-Medical): No  Physical Activity: Insufficiently Active (08/23/2021)   Exercise Vital Sign    Days of Exercise per Week: 7 days    Minutes of Exercise per Session: 10 min  Stress: No Stress Concern Present (08/16/2020)   [place]    Feeling of Stress : Only a little  Social Connections: Socially Isolated (08/23/2021)   Social Connection and Isolation Panel [NHANES]    Frequency of Communication with Friends and Family: More than three times a week     Frequency of Social Gatherings with Friends and Family: Three times a week    Attends Religious Services: Never    Active Member of Clubs or Organizations: No    Attends Archivist Meetings: Never    Marital Status: Widowed  Intimate Partner Violence: Not At Risk (11/26/2021)   Humiliation, Afraid, Rape, and Kick questionnaire    Fear of Current or Ex-Partner: No    Emotionally Abused: No    Physically Abused: No    Sexually Abused: No    Functional Status Survey:    Family History  Problem Relation Age of Onset   Cancer Other        Bladder  cancer   Heart attack Brother    Diabetes Brother     Health Maintenance  Topic Date Due   DTaP/Tdap/Td (4 - Td or Tdap) 10/15/2020   INFLUENZA VACCINE  09/10/2021   COVID-19 Vaccine (6 - 2023-24 season) 10/11/2021   Medicare Annual Wellness (AWV)  08/24/2022   Pneumonia Vaccine 67+ Years old  Completed   DEXA SCAN  Completed   HPV VACCINES  Aged Out   Zoster Vaccines- Shingrix  Discontinued    Allergies  Allergen Reactions   Morphine And Related Anaphylaxis and Other (See Comments)    Given after knee replacement and code blue occurred   Morphine Sulfate Anaphylaxis   Penicillins Hives, Itching and Other (See Comments)    Syncope, also   Nsaids Other (See Comments)    Because of an abnormal kidney test result   Penicillin V Hives   Shellfish Allergy Nausea And Vomiting and Other (See Comments)    Pt has shellfish allergy only.  Has had IV contrast x 2 and did fine.   Shellfish-Derived Products Nausea And Vomiting    Outpatient Encounter Medications as of 02/25/2022  Medication Sig   acetaminophen (TYLENOL) 500 MG tablet Take 1,000 mg by mouth every 8 (eight) hours as needed.   apixaban (ELIQUIS) 5 MG TABS tablet Take 1 tablet (5 mg total) by mouth 2 (two) times daily.   atorvastatin (LIPITOR) 40 MG tablet Take 1 tablet (40 mg total) by mouth daily.   azelastine (ASTELIN) 0.1 % nasal spray Place 1 spray into both  nostrils daily as needed for rhinitis or allergies.   B Complex-C-Folic Acid (B-COMPLEX/FOLIC ACID/VITAMIN C PO) Take 1 tablet by mouth daily with breakfast.   bimatoprost (LUMIGAN) 0.03 % ophthalmic solution Place 1 drop into both eyes at bedtime.   calcium carbonate (TUMS EX) 750 MG chewable tablet Chew 750 mg by mouth daily as needed for heartburn.   Cholecalciferol (VITAMIN D3) 50 MCG (2000 UT) TABS Take 2,000 Units by mouth in the morning.   cycloSPORINE (RESTASIS) 0.05 % ophthalmic emulsion Place 1 drop into both eyes 2 (two) times daily.   diclofenac Sodium (VOLTAREN) 1 % GEL Apply 2 g topically in the morning and at bedtime.   docusate sodium (COLACE) 100 MG capsule Take 100 mg by mouth daily.   hydrALAZINE (APRESOLINE) 10 MG tablet Take 10 mg by mouth 2 (two) times daily as needed.   levothyroxine (SYNTHROID) 25 MCG tablet Take 25 mcg by mouth daily before breakfast.   meclizine (ANTIVERT) 12.5 MG tablet Take 1 tablet (12.5 mg total) by mouth 2 (two) times daily as needed for dizziness.   melatonin 3 MG TABS tablet Take 1 tablet (3 mg total) by mouth at bedtime.   miconazole (MICOTIN) 2 % powder Apply topically daily. Patient may apply to dry skin under breasts daily and after showering   mirabegron ER (MYRBETRIQ) 25 MG TB24 tablet Take 25 mg by mouth daily.   nystatin (MYCOSTATIN/NYSTOP) powder Apply 1 application topically 2 (two) times daily as needed.   omeprazole (PRILOSEC) 20 MG capsule Take 20 mg by mouth daily.   sodium chloride 1 g tablet Take 1 g by mouth 2 (two) times daily.   zinc oxide 20 % ointment Apply 1 application topically as needed (for redness- to be applied to buttocks/peri area and after every incontinent episode).   No facility-administered encounter medications on file as of 02/25/2022.    Review of Systems  Constitutional:  Positive for activity change.  HENT: Negative.    Respiratory: Negative.    Cardiovascular: Negative.   Genitourinary: Negative.    Musculoskeletal:  Positive for gait problem.  Psychiatric/Behavioral:  Positive for confusion.   All other systems reviewed and are negative.   There were no vitals filed for this visit. There is no height or weight on file to calculate BMI. Physical Exam Vitals and nursing note reviewed.  Constitutional:      Appearance: Normal appearance.  HENT:     Head: Normocephalic.     Mouth/Throat:     Mouth: Mucous membranes are moist.     Pharynx: Oropharynx is clear.  Eyes:     Extraocular Movements: Extraocular movements intact.     Pupils: Pupils are equal, round, and reactive to light.  Cardiovascular:     Rate and Rhythm: Normal rate. Rhythm irregular.     Heart sounds: Murmur heard.  Pulmonary:     Effort: Pulmonary effort is normal.     Breath sounds: Normal breath sounds.  Abdominal:     General: Bowel sounds are normal.     Palpations: Abdomen is soft.  Musculoskeletal:        General: Normal range of motion.  Skin:    General: Skin is warm and dry.  Neurological:     General: No focal deficit present.     Mental Status: She is alert.     Comments: Oriented to person and place  Psychiatric:        Mood and Affect: Mood normal.        Behavior: Behavior normal.     Labs reviewed: Basic Metabolic Panel: Recent Labs    11/24/21 1023 11/26/21 0420 01/22/22 0925  NA 135 135 138  K 3.9 3.7 4.5  CL 101 104 104  CO2 '26 23 25  '$ GLUCOSE 122* 109* 125*  BUN 22 29* 21  CREATININE 1.20* 1.23* 1.23*  CALCIUM 9.5 8.7* 9.3   Liver Function Tests: Recent Labs    05/27/21 0000 05/27/21 1244 11/24/21 1023 01/22/22 0925  AST  --  12* 23 26  ALT  --  '18 16 16  '$ ALKPHOS  --  63 67 68  BILITOT  --   --  0.6 0.6  PROT  --   --  8.5* 7.1  ALBUMIN 3.4*  --  4.2 3.5   No results for input(s): "LIPASE", "AMYLASE" in the last 8760 hours. No results for input(s): "AMMONIA" in the last 8760 hours. CBC: Recent Labs    09/12/21 0000 11/24/21 1023 11/26/21 0420  01/22/22 0925  WBC 6.5 8.2 8.9 7.8  NEUTROABS 4,381.00 6.6  --  6.3  HGB 13.9 13.9 12.7 12.6  HCT 43 44.4 40.1 40.6  MCV  --  84.6 84.2 84.1  PLT 212 237 202 191   Cardiac Enzymes: No results for input(s): "CKTOTAL", "CKMB", "CKMBINDEX", "TROPONINI" in the last 8760 hours. BNP: Invalid input(s): "POCBNP" Lab Results  Component Value Date   HGBA1C 5.7 (H) 11/24/2021   Lab Results  Component Value Date   TSH 3.50 03/28/2021   No results found for: "VITAMINB12" No results found for: "FOLATE" Lab Results  Component Value Date   IRON 74 05/27/2011   TIBC 299 05/27/2011   FERRITIN 498 (H) 05/27/2011    Imaging and Procedures obtained prior to SNF admission: MR BRAIN WO CONTRAST  Result Date: 01/22/2022 CLINICAL DATA:  Acute neuro deficit.  Rule out stroke EXAM: MRI HEAD WITHOUT CONTRAST TECHNIQUE: Multiplanar, multiecho pulse sequences of the  brain and surrounding structures were obtained without intravenous contrast. COMPARISON:  CT head 01/22/2022.  MRI head 11/24/2021 FINDINGS: Brain: Negative for acute infarct Moderate white matter changes with confluent and diffuse T2 and FLAIR hyperintensity throughout the white matter. Chronic lacunar infarcts are present in the basal ganglia bilaterally. Mild chronic ischemic change in the pons. Chronic small infarct right cerebellum. Small chronic cortical infarct in the left posterior parietal lobe with laminar necrosis. This area shows restricted diffusion on the MRI of 11/24/2021. Ventricle size normal. Generalized atrophy. Vascular: Normal arterial flow voids. Skull and upper cervical spine: No focal abnormality. Sinuses/Orbits: Mild mucosal edema paranasal sinuses. Left cataract extraction. No orbital mass Other: None IMPRESSION: 1. Negative for acute infarct. 2. Moderate atrophy.  Moderate chronic ischemic changes. Electronically Signed   By: Franchot Gallo M.D.   On: 01/22/2022 11:55   CT Head Wo Contrast  Result Date:  01/22/2022 CLINICAL DATA:  Delirium.  Altered mental status EXAM: CT HEAD WITHOUT CONTRAST TECHNIQUE: Contiguous axial images were obtained from the base of the skull through the vertex without intravenous contrast. RADIATION DOSE REDUCTION: This exam was performed according to the departmental dose-optimization program which includes automated exposure control, adjustment of the mA and/or kV according to patient size and/or use of iterative reconstruction technique. COMPARISON:  None Available. FINDINGS: Brain: No acute intracranial hemorrhage. No focal mass lesion. No CT evidence of acute infarction. No midline shift or mass effect. No hydrocephalus. Basilar cisterns are patent. There are periventricular and subcortical white matter hypodensities. Generalized cortical atrophy. Vascular: No hyperdense vessel or unexpected calcification. Skull: Normal. Negative for fracture or focal lesion. Sinuses/Orbits: Paranasal sinuses and mastoid air cells are clear. Orbits are clear. Other: None. IMPRESSION: 1. No acute intracranial findings. 2. Atrophy and white matter microvascular disease Electronically Signed   By: Suzy Bouchard M.D.   On: 01/22/2022 07:55   DG Chest Portable 1 View  Result Date: 01/22/2022 CLINICAL DATA:  Altered mental status. EXAM: PORTABLE CHEST 1 VIEW COMPARISON:  Portable chest 04/18/2020 FINDINGS: There is patchy opacity in the left lower lung field concerning for consolidated pneumonia, possibly of the lingula as there is silhouetting of the lower left heart border. The remainder of the lungs are clear. The heart is slightly enlarged and no vascular congestion is seen. There is moderate calcification of the aorta with stable mediastinal configuration. There is osteopenia, thoracic spondylosis and slight dextroscoliosis. No appreciable pleural effusion. IMPRESSION: Patchy opacity in the left lower lung field concerning for consolidated pneumonia. Clinical correlation and radiographic  follow-up recommended. Electronically Signed   By: Telford Nab M.D.   On: 01/22/2022 07:07    Assessment/Plan 1. Acute CVA (cerebrovascular accident) [facility]) This stroke is not acute.  There are no residual or sequelae  2. Atherosclerosis of abdominal aorta ([place]) Management includes control of blood pressure and lipids  3. Essential hypertension Pressures have fluctuated wildly over the past several months.  She does have clonidine ordered for pressures greater than 160/90 but I would like to institute low-dose amlodipine 2.5 mg on a daily basis for more stability with her pressures  4. Hyperlipidemia, unspecified hyperlipidemia type She continues on statin. Done 4 months ago 127 on atorvastatin 40 mg  5. Mixed Alzheimer's and vascular dementia ([place]) Her disease is in the mild to moderate stage.  She is appropriate for this facility.  Supportive care only at this point  6. Age-related osteoporosis without current pathological fracture Continue with calcium and vitamin D    Family/  staff Communication:   Labs/tests ordered:  .smmsig

## 2022-03-10 IMAGING — DX DG CHEST 1V PORT
1 series · 1 of 1 positions shown · non-contrast
Comparison: PA and lateral chest 01/25/2014

CLINICAL DATA: Status post fall.  Shortness of breath.

EXAM:
PORTABLE CHEST 1 VIEW

[chest ap]
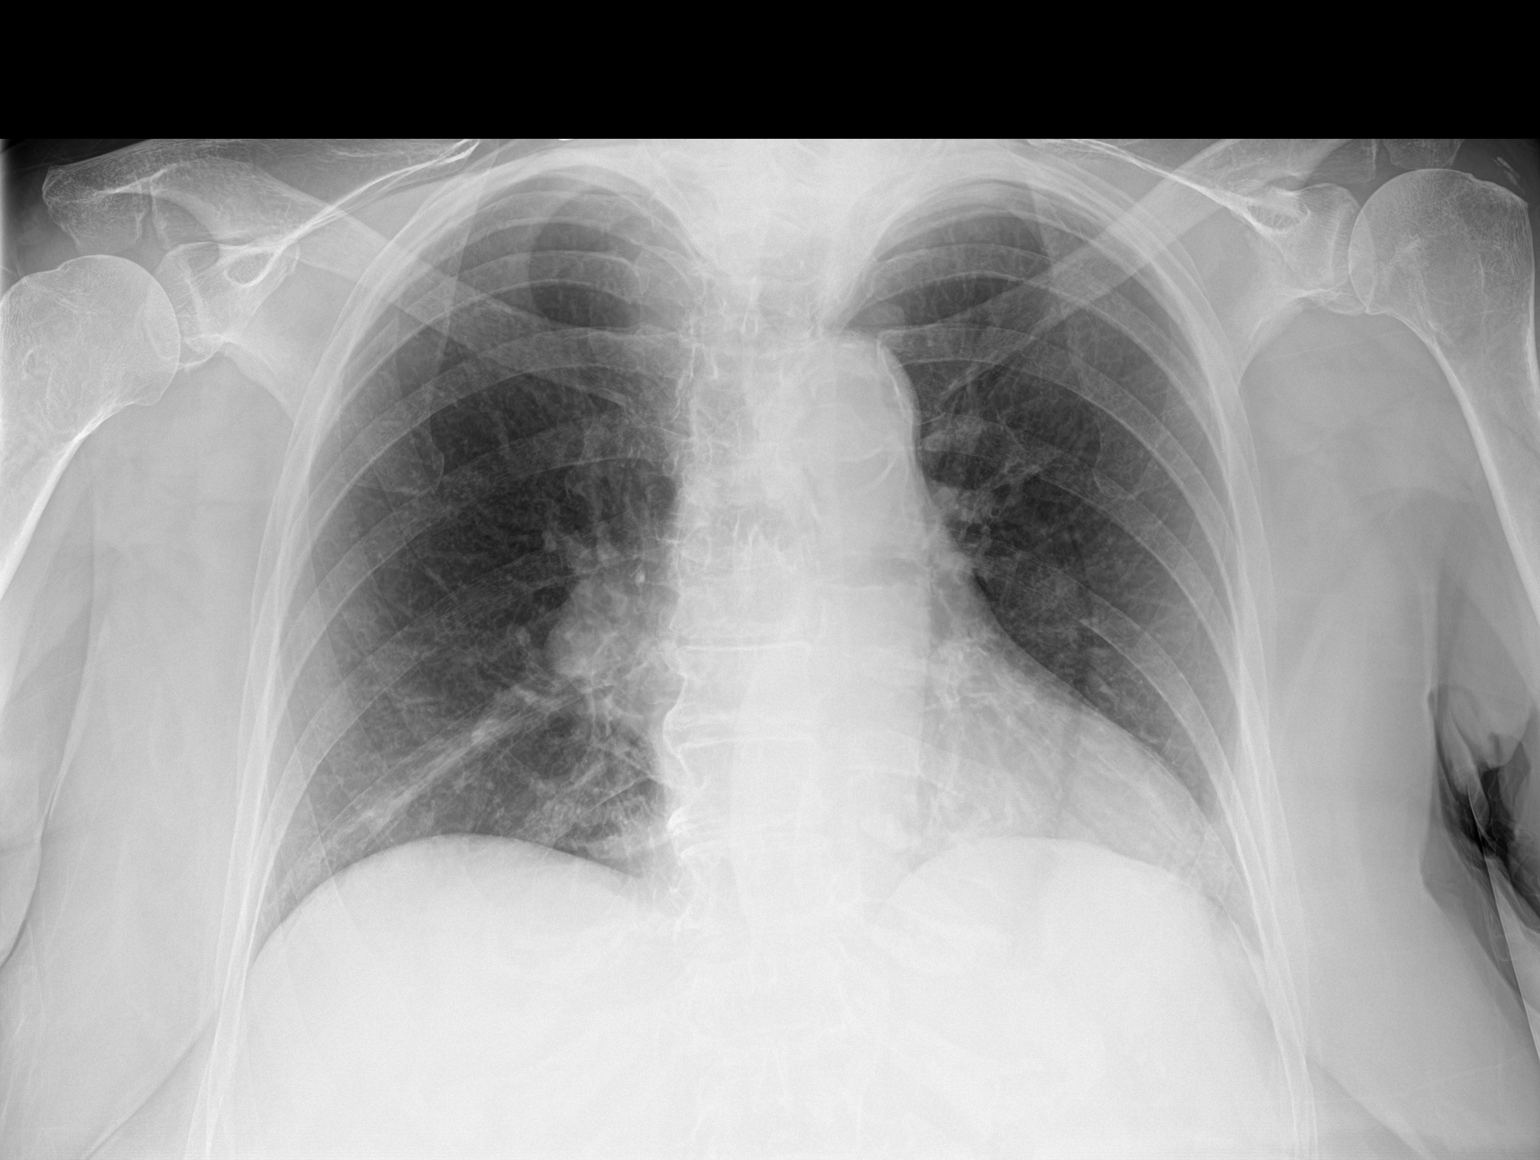

[1 of 1 positions shown; findings below may reference images not displayed]

FINDINGS: Lungs clear. Heart size normal. Atherosclerosis. No pneumothorax or
pleural fluid. No acute or focal bony abnormality
IMPRESSION: No acute disease.

Aortic Atherosclerosis ([id number]).

## 2022-03-25 ENCOUNTER — Non-Acute Institutional Stay (SKILLED_NURSING_FACILITY): Payer: Medicare Other | Admitting: Nurse Practitioner

## 2022-03-25 ENCOUNTER — Encounter: Payer: Self-pay | Admitting: Nurse Practitioner

## 2022-03-25 DIAGNOSIS — F0390 Unspecified dementia without behavioral disturbance: Secondary | ICD-10-CM

## 2022-03-25 DIAGNOSIS — I10 Essential (primary) hypertension: Secondary | ICD-10-CM | POA: Diagnosis not present

## 2022-03-25 DIAGNOSIS — E039 Hypothyroidism, unspecified: Secondary | ICD-10-CM

## 2022-03-25 DIAGNOSIS — I639 Cerebral infarction, unspecified: Secondary | ICD-10-CM

## 2022-03-25 DIAGNOSIS — I4892 Unspecified atrial flutter: Secondary | ICD-10-CM

## 2022-03-25 DIAGNOSIS — R350 Frequency of micturition: Secondary | ICD-10-CM

## 2022-03-25 DIAGNOSIS — E785 Hyperlipidemia, unspecified: Secondary | ICD-10-CM

## 2022-03-25 DIAGNOSIS — M159 Polyosteoarthritis, unspecified: Secondary | ICD-10-CM

## 2022-03-25 NOTE — Assessment & Plan Note (Signed)
f/u Cardiology, off anticoagulation 2/2 falls in the past, restarted Eliquis after acute CVA 11/24/21

## 2022-03-25 NOTE — Progress Notes (Signed)
Location:  [facility] Room Number: [employer] of Service:  SNF (31) Provider:  Rosa Wyly X, NP  Patient Care Team: Ricki Vanhandel X, NP as PCP - General (Internal Medicine) Donato Heinz, MD as PCP - Cardiology (Cardiology) Brien Few, MD as Consulting Physician (Obstetrics and Gynecology) Marti Sleigh, MD as Consulting Physician (Gynecology) Nancy Marus, MD as Consulting Physician (Gynecologic Oncology)  Extended Emergency Contact Information Primary Emergency Contact: [employer] Address: [address]          [place], [place] [address] [name] of [place] Phone: [phone] Mobile Phone: [phone] Relation: Daughter  Code Status:  Full Code Goals of care: Advanced Directive information    03/25/2022   11:08 AM  Advanced Directives  Does Patient Have a Medical Advance Directive? Yes  Type of Paramedic of [place];Living will  Does patient want to make changes to medical advance directive? No - Patient declined  Copy of [place] in Chart? Yes - validated most recent copy scanned in chart (See row information)     Chief Complaint  Patient presents with   Medical Management of Chronic Issues    Routine Visit   Quality Metric Gaps    Need to Discuss DTAP, Influenza, and Covid vaccine    HPI:  Pt is a 89 y.o. female seen today for medical management of chronic diseases.    01/22/22 left lower lung PNA             12/23/21 UTI             11/24/21 acute CVA left PCA embolic strokes, no apparent residual symptoms, on Eliquis f/u Neurology             CKD, Bun/creat 21/1.23 01/22/22             Hypothyroidism, takes Levothyroxine, TSH 3.5 05/27/21             Hyponatremia, Na 138 01/22/22             Syncope: Orthostasis ?, no further recurrence since admitted to SNF [facility]             A flutter, f/u Cardiology, off anticoagulation 2/2 falls in the past, restarted  Eliquis after acute CVA 11/24/21             Hx of ovarian CA s/p TAH/BSO 03/2011, in remission.             vascular dementia,  SNF [facility] for supportive care.              Constipation takes Colace.              HTN, takes Hydralazine              OP, takes Ca, Vit D             Insomnia, off Ambien.              Hyperlipidemia, on Atorvastatin, LDL 127 11/25/21             OA, R ankle pain, uses ankle brace, takes Tylenol, w/c for mobility most of time.              Urinary frequency, on Mirabegron   Past Medical History:  Diagnosis Date   Anemia associated with acute blood loss 04/03/2011   Arthritis    knees    Ascites    Blood transfusion    hx of  [id number]    Complication of anesthesia    Sodium drops per pt    Cystitis    DIARRHEA, ANTIBIOTIC ASSOCIATED 05/31/2009   Qualifier: Diagnosis of  By: Tommy Medal MD, Roderic Scarce     GERD (gastroesophageal reflux disease)    H/O hiatal hernia    Hyperlipidemia    Hypertension    Hyponatremia    Neutropenia with fever ([place]) 05/18/2011   OSTEOMYELITIS, CHRONIC, LOWER LEG 04/23/2009   Qualifier: Diagnosis of  By: Johnnye Sima MD, Felecia Shelling    OSTEOPOROSIS 04/23/2009   Qualifier: Diagnosis of  By: Johnnye Sima MD, Jeffrey     Ovarian cancer ([place]) 01/25/2011   Pneumonia    hx of    Postmenopausal atrophic vaginitis    PROSTHETIC JOINT COMPLICATION [id number]   Qualifier: Diagnosis of  By: Tommy Medal MD, Cornelius     Renal disorder    Decreased kidney function   Staph infection 2010   after knee replacement   Urinary frequency    Vulvitis    Past Surgical History:  Procedure Laterality Date   ABDOMINAL HYSTERECTOMY  04/01/2011   Procedure: HYSTERECTOMY ABDOMINAL;  Surgeon: Alvino Chapel, MD;  Location: [facility];  Service: Gynecology;  Laterality: N/A;   APPENDECTOMY     JOINT REPLACEMENT     R knee in 2008, 5 operations on L knee   LAPAROTOMY  04/01/2011   Procedure: EXPLORATORY LAPAROTOMY;  Surgeon: Alvino Chapel, MD;   Location: [facility];  Service: Gynecology;  Laterality: N/A;   OTHER SURGICAL HISTORY     hx of C section 1966   SALPINGOOPHORECTOMY  04/01/2011   Procedure: SALPINGO OOPHERECTOMY;  Surgeon: Alvino Chapel, MD;  Location: [facility];  Service: Gynecology;  Laterality: Bilateral;   TONSILLECTOMY      Allergies  Allergen Reactions   Morphine And Related Anaphylaxis and Other (See Comments)    Given after knee replacement and code blue occurred   Morphine Sulfate Anaphylaxis   Penicillins Hives, Itching and Other (See Comments)    Syncope, also   Nsaids Other (See Comments)    Because of an abnormal kidney test result   Penicillin V Hives   Shellfish Allergy Nausea And Vomiting and Other (See Comments)    Pt has shellfish allergy only.  Has had IV contrast x 2 and did fine.   Shellfish-Derived Products Nausea And Vomiting    Outpatient Encounter Medications as of 03/25/2022  Medication Sig   acetaminophen (TYLENOL) 500 MG tablet Take 1,000 mg by mouth every 8 (eight) hours as needed.   apixaban (ELIQUIS) 5 MG TABS tablet Take 1 tablet (5 mg total) by mouth 2 (two) times daily.   atorvastatin (LIPITOR) 40 MG tablet Take 1 tablet (40 mg total) by mouth daily.   azelastine (ASTELIN) 0.1 % nasal spray Place 1 spray into both nostrils daily as needed for rhinitis or allergies.   B Complex-C-Folic Acid (B-COMPLEX/FOLIC ACID/VITAMIN C PO) Take 1 tablet by mouth daily with breakfast.   bimatoprost (LUMIGAN) 0.03 % ophthalmic solution Place 1 drop into both eyes at bedtime.   calcium carbonate (TUMS EX) 750 MG chewable tablet Chew 750 mg by mouth daily as needed for heartburn.   Cholecalciferol (VITAMIN D3) 50 MCG (2000 UT) TABS Take 2,000 Units by mouth in the morning.   cycloSPORINE (RESTASIS) 0.05 % ophthalmic emulsion Place 1 drop into both eyes 2 (two) times daily.   diclofenac Sodium (VOLTAREN) 1 % GEL Apply 2 g topically  in the morning and at bedtime.   docusate sodium (COLACE) 100 MG  capsule Take 100 mg by mouth daily.   levothyroxine (SYNTHROID) 25 MCG tablet Take 25 mcg by mouth daily before breakfast.   meclizine (ANTIVERT) 12.5 MG tablet Take 1 tablet (12.5 mg total) by mouth 2 (two) times daily as needed for dizziness.   melatonin 3 MG TABS tablet Take 1 tablet (3 mg total) by mouth at bedtime.   miconazole (MICOTIN) 2 % powder Apply topically daily. Patient may apply to dry skin under breasts daily and after showering   mirabegron ER (MYRBETRIQ) 25 MG TB24 tablet Take 25 mg by mouth daily.   nystatin (MYCOSTATIN/NYSTOP) powder Apply 1 application topically 2 (two) times daily as needed.   pantoprazole (PROTONIX) 40 MG tablet Take 40 mg by mouth daily.   sodium chloride 1 g tablet Take 1 g by mouth 2 (two) times daily.   zinc oxide 20 % ointment Apply 1 application topically as needed (for redness- to be applied to buttocks/peri area and after every incontinent episode).   [DISCONTINUED] hydrALAZINE (APRESOLINE) 10 MG tablet Take 10 mg by mouth 2 (two) times daily as needed.   [DISCONTINUED] omeprazole (PRILOSEC) 20 MG capsule Take 20 mg by mouth daily.   No facility-administered encounter medications on file as of 03/25/2022.    Review of Systems  Constitutional:  Positive for fatigue. Negative for appetite change and fever.  HENT:  Positive for hearing loss. Negative for congestion and trouble swallowing.   Eyes:  Negative for visual disturbance.  Respiratory:  Negative for cough and shortness of breath.   Cardiovascular:  Negative for leg swelling.  Gastrointestinal:  Negative for abdominal pain, constipation, nausea and vomiting.  Genitourinary:  Positive for frequency. Negative for difficulty urinating, dysuria, flank pain and urgency.  Musculoskeletal:  Positive for arthralgias, back pain and gait problem.       R ankle pain.   Skin:  Negative for color change.  Neurological:  Negative for speech difficulty, weakness and light-headedness.   Psychiatric/Behavioral:  Negative for confusion and sleep disturbance. The patient is not nervous/anxious.     Immunization History  Administered Date(s) Administered   Influenza Split 11/13/2012, 12/13/2012, 11/30/2013, 10/09/2014, 11/24/2018   Influenza, High Dose Seasonal PF 12/01/2013, 10/04/2014, 10/12/2015, 11/06/2016, 11/12/2017   Influenza,inj,Quad PF,6+ Mos 10/24/2010   Influenza-Unspecified 10/24/2010, 11/01/2019, 12/04/2020   Moderna Covid-19 Vaccine Bivalent Booster 53yr & up 07/24/2021   Moderna SARS-COV2 Booster Vaccination 07/18/2020   Moderna Sars-Covid-2 Vaccination 02/14/2019, 03/14/2019, 12/26/2019   PFIZER(Purple Top)SARS-COV-2 Vaccination 10/31/2020   PPD Test 11/27/2021   Pfizer Covid-19 Vaccine Bivalent Booster 155yr& up 10/31/2020   Pneumococcal Conjugate-13 09/28/2013   Pneumococcal Polysaccharide-23 03/05/2012   Td 10/16/2010   Td (Adult) 08/27/2000   Tdap 08/27/2000, 10/16/2010   Zoster, Live 01/11/2013, 03/31/2020   Pertinent  Health Maintenance Due  Topic Date Due   INFLUENZA VACCINE  09/10/2021   DEXA SCAN  Completed      12/10/2021    9:16 AM 01/09/2022   12:58 PM 01/22/2022    7:00 AM 02/12/2022    4:30 PM 03/25/2022   11:07 AM  Fall Risk  Falls in the past year? 0 0  0 0  Was there an injury with Fall? 0 1  0 0  Fall Risk Category Calculator 0 1  0 0  Fall Risk Category (Retired) Low Low  Low   (RETIRED) Patient Fall Risk Level High fall risk High fall risk High fall  risk High fall risk   Patient at Risk for Falls Due to History of fall(s);Impaired balance/gait;Impaired mobility;Mental status change History of fall(s);Impaired balance/gait;Impaired mobility;Mental status change  History of fall(s);Impaired balance/gait;Impaired mobility;Mental status change History of fall(s);Impaired balance/gait;Impaired mobility;Mental status change  Fall risk Follow up Falls evaluation completed Falls evaluation completed  Falls evaluation completed Falls  evaluation completed   Functional Status Survey:    Vitals:   03/25/22 1017  BP: 129/63  Pulse: 74  Resp: 17  Temp: (!) 97.4 F (36.3 C)  SpO2: 94%  Weight: 191 lb 2 oz (86.7 kg)  Height: 5' 3"$  (1.6 m)   Body mass index is 33.86 kg/m. Physical Exam Vitals and nursing note reviewed.  Constitutional:      Appearance: Normal appearance.  HENT:     Head: Normocephalic and atraumatic.     Mouth/Throat:     Mouth: Mucous membranes are moist.  Eyes:     Extraocular Movements: Extraocular movements intact.     Conjunctiva/sclera: Conjunctivae normal.     Pupils: Pupils are equal, round, and reactive to light.  Cardiovascular:     Rate and Rhythm: Normal rate. Rhythm irregular.     Heart sounds: No murmur heard. Pulmonary:     Effort: Pulmonary effort is normal.     Breath sounds: No rales.  Abdominal:     General: Bowel sounds are normal.     Palpations: Abdomen is soft.     Tenderness: There is no abdominal tenderness.     Hernia: A hernia is present.     Comments: Incision hernia  Musculoskeletal:        General: Tenderness present.     Cervical back: Normal range of motion and neck supple.     Right lower leg: No edema.     Left lower leg: No edema.     Comments: Right ankle brace, chronic pain.  Skin:    General: Skin is warm and dry.  Neurological:     General: No focal deficit present.     Mental Status: She is alert and oriented to person, place, and time. Mental status is at baseline.     Motor: No weakness.     Gait: Gait abnormal.  Psychiatric:        Mood and Affect: Mood normal.        Behavior: Behavior normal.        Thought Content: Thought content normal.     Labs reviewed: Recent Labs    11/24/21 1023 11/26/21 0420 01/22/22 0925  NA 135 135 138  K 3.9 3.7 4.5  CL 101 104 104  CO2 26 23 25  $ GLUCOSE 122* 109* 125*  BUN 22 29* 21  CREATININE 1.20* 1.23* 1.23*  CALCIUM 9.5 8.7* 9.3   Recent Labs    05/27/21 0000 05/27/21 1244  11/24/21 1023 01/22/22 0925  AST  --  12* 23 26  ALT  --  18 16 16  $ ALKPHOS  --  63 67 68  BILITOT  --   --  0.6 0.6  PROT  --   --  8.5* 7.1  ALBUMIN 3.4*  --  4.2 3.5   Recent Labs    09/12/21 0000 11/24/21 1023 11/26/21 0420 01/22/22 0925  WBC 6.5 8.2 8.9 7.8  NEUTROABS 4,381.00 6.6  --  6.3  HGB 13.9 13.9 12.7 12.6  HCT 43 44.4 40.1 40.6  MCV  --  84.6 84.2 84.1  PLT 212 237 202 191  Lab Results  Component Value Date   TSH 3.50 03/28/2021   Lab Results  Component Value Date   HGBA1C 5.7 (H) 11/24/2021   Lab Results  Component Value Date   CHOL 181 11/25/2021   HDL 37 (L) 11/25/2021   LDLCALC 127 (H) 11/25/2021   TRIG 85 11/25/2021   CHOLHDL 4.9 11/25/2021    Significant Diagnostic Results in last 30 days:  No results found.  Assessment/Plan Hypothyroidism takes Levothyroxine, TSH 3.5 05/27/21, update TSH   Atrial flutter ([place])  f/u Cardiology, off anticoagulation 2/2 falls in the past, restarted Eliquis after acute CVA 11/24/21  Senile dementia ([place]) SNF [facility] for supportive care.   Essential hypertension Blood pressure is controlled,  takes Hydralazine   HLD (hyperlipidemia) on Atorvastatin, LDL 127 11/25/21  Osteoarthritis R ankle pain, uses ankle brace, takes Tylenol, w/c for mobility most of time.   Urinary frequency No change, on Mirabegron     Family/ staff Communication: plan of care reviewed with the patient and charge nurse.   Labs/tests ordered:  none  Time spend 35 minutes.

## 2022-03-25 NOTE — Assessment & Plan Note (Signed)
acute CVA left PCA embolic strokes, no apparent residual symptoms, on Eliquis f/u Neurology

## 2022-03-25 NOTE — Assessment & Plan Note (Signed)
takes Levothyroxine, TSH 3.5 05/27/21, update TSH

## 2022-03-25 NOTE — Assessment & Plan Note (Signed)
R ankle pain, uses ankle brace, takes Tylenol, w/c for mobility most of time.

## 2022-03-25 NOTE — Assessment & Plan Note (Signed)
No change, on Mirabegron

## 2022-03-25 NOTE — Assessment & Plan Note (Signed)
on Atorvastatin, LDL 127 11/25/21

## 2022-03-25 NOTE — Assessment & Plan Note (Signed)
Blood pressure is controlled,  takes Hydralazine

## 2022-03-25 NOTE — Assessment & Plan Note (Signed)
SNF [facility] for supportive care.

## 2022-03-27 LAB — TSH: TSH: 3.19 (ref 0.41–5.90)

## 2022-04-03 ENCOUNTER — Encounter: Payer: Self-pay | Admitting: Adult Health

## 2022-04-03 ENCOUNTER — Non-Acute Institutional Stay (SKILLED_NURSING_FACILITY): Payer: Medicare Other | Admitting: Adult Health

## 2022-04-03 DIAGNOSIS — N3281 Overactive bladder: Secondary | ICD-10-CM | POA: Diagnosis not present

## 2022-04-03 DIAGNOSIS — R350 Frequency of micturition: Secondary | ICD-10-CM

## 2022-04-03 NOTE — Progress Notes (Signed)
Location:  [place] Room Number: [place] of Service:  SNF (31) Provider:  Locklyn Henriquez C. [place], [place]  Patient Care Team: Mast, Man X, NP as PCP - General (Internal Medicine) Donato Heinz, MD as PCP - Cardiology (Cardiology) Brien Few, MD as Consulting Physician (Obstetrics and Gynecology) Marti Sleigh, MD as Consulting Physician (Gynecology) Nancy Marus, MD as Consulting Physician (Gynecologic Oncology)  Extended Emergency Contact Information Primary Emergency Contact: [facility] Address: [address]          [place], [place] [address] [name] of [place] Phone: [phone] Mobile Phone: [phone] Relation: Daughter  Code Status:  Full Code Goals of care: Advanced Directive information    04/03/2022    3:37 PM  Advanced Directives  Does Patient Have a Medical Advance Directive? Yes  Type of Paramedic of [place];Living will  Does patient want to make changes to medical advance directive? No - Patient declined  Copy of [place] in Chart? Yes - validated most recent copy scanned in chart (See row information)     Chief Complaint  Patient presents with   Acute Visit    Increase in confusion and urinary frequency     HPI:  Pt is a 89 y.o. female seen today for an acute visit for confusion and urinary frequency. She was reported to be fixated on things. She would repeatedly ask staff same questions. CNA reported that she has noted to have increased in urinary frequency and has a strong urine odor. No hematuria nor fever reported.   Past Medical History:  Diagnosis Date   Anemia associated with acute blood loss 04/03/2011   Arthritis    knees    Ascites    Blood transfusion    hx of [id number]    Complication of anesthesia    Sodium drops per pt    Cystitis    DIARRHEA, ANTIBIOTIC ASSOCIATED 05/31/2009   Qualifier: Diagnosis of  By: Tommy Medal MD,  Cornelius     GERD (gastroesophageal reflux disease)    H/O hiatal hernia    Hyperlipidemia    Hypertension    Hyponatremia    Neutropenia with fever ([place]) 05/18/2011   OSTEOMYELITIS, CHRONIC, LOWER LEG 04/23/2009   Qualifier: Diagnosis of  By: Johnnye Sima MD, Felecia Shelling    OSTEOPOROSIS 04/23/2009   Qualifier: Diagnosis of  By: Johnnye Sima MD, Jeffrey     Ovarian cancer ([place]) 01/25/2011   Pneumonia    hx of    Postmenopausal atrophic vaginitis    PROSTHETIC JOINT COMPLICATION [id number]   Qualifier: Diagnosis of  By: Tommy Medal MD, Cornelius     Renal disorder    Decreased kidney function   Staph infection 2010   after knee replacement   Urinary frequency    Vulvitis    Past Surgical History:  Procedure Laterality Date   ABDOMINAL HYSTERECTOMY  04/01/2011   Procedure: HYSTERECTOMY ABDOMINAL;  Surgeon: Alvino Chapel, MD;  Location: [facility];  Service: Gynecology;  Laterality: N/A;   APPENDECTOMY     JOINT REPLACEMENT     R knee in 2008, 5 operations on L knee   LAPAROTOMY  04/01/2011   Procedure: EXPLORATORY LAPAROTOMY;  Surgeon: Alvino Chapel, MD;  Location: [facility];  Service: Gynecology;  Laterality: N/A;   OTHER SURGICAL HISTORY     hx of C section 1966   SALPINGOOPHORECTOMY  04/01/2011   Procedure: SALPINGO OOPHERECTOMY;  Surgeon: Alvino Chapel, MD;  Location: [facility];  Service: Gynecology;  Laterality: Bilateral;   TONSILLECTOMY      Allergies  Allergen Reactions   Morphine And Related Anaphylaxis and Other (See Comments)    Given after knee replacement and code blue occurred   Morphine Sulfate Anaphylaxis   Penicillins Hives, Itching and Other (See Comments)    Syncope, also   Nsaids Other (See Comments)    Because of an abnormal kidney test result   Penicillin V Hives   Shellfish Allergy Nausea And Vomiting and Other (See Comments)    Pt has shellfish allergy only.  Has had IV contrast x 2 and did fine.   Shellfish-Derived Products Nausea  And Vomiting    Outpatient Encounter Medications as of 04/03/2022  Medication Sig   acetaminophen (TYLENOL) 500 MG tablet Take 1,000 mg by mouth every 8 (eight) hours as needed.   apixaban (ELIQUIS) 5 MG TABS tablet Take 1 tablet (5 mg total) by mouth 2 (two) times daily.   atorvastatin (LIPITOR) 40 MG tablet Take 1 tablet (40 mg total) by mouth daily.   azelastine (ASTELIN) 0.1 % nasal spray Place 1 spray into both nostrils daily as needed for rhinitis or allergies.   B Complex-C-Folic Acid (B-COMPLEX/FOLIC ACID/VITAMIN C PO) Take 1 tablet by mouth daily with breakfast.   bimatoprost (LUMIGAN) 0.03 % ophthalmic solution Place 1 drop into both eyes at bedtime.   calcium carbonate (TUMS EX) 750 MG chewable tablet Chew 750 mg by mouth daily as needed for heartburn.   Cholecalciferol (VITAMIN D3) 50 MCG (2000 UT) TABS Take 2,000 Units by mouth in the morning.   cycloSPORINE (RESTASIS) 0.05 % ophthalmic emulsion Place 1 drop into both eyes 2 (two) times daily.   diclofenac Sodium (VOLTAREN) 1 % GEL Apply 2 g topically in the morning and at bedtime.   docusate sodium (COLACE) 100 MG capsule Take 100 mg by mouth daily.   levothyroxine (SYNTHROID) 25 MCG tablet Take 25 mcg by mouth daily before breakfast.   meclizine (ANTIVERT) 12.5 MG tablet Take 1 tablet (12.5 mg total) by mouth 2 (two) times daily as needed for dizziness.   melatonin 3 MG TABS tablet Take 1 tablet (3 mg total) by mouth at bedtime.   miconazole (MICOTIN) 2 % powder Apply topically daily. Patient may apply to dry skin under breasts daily and after showering   mirabegron ER (MYRBETRIQ) 25 MG TB24 tablet Take 25 mg by mouth daily.   nystatin (MYCOSTATIN/NYSTOP) powder Apply 1 application topically 2 (two) times daily as needed.   pantoprazole (PROTONIX) 40 MG tablet Take 40 mg by mouth daily.   sodium chloride 1 g tablet Take 1 g by mouth 2 (two) times daily.   zinc oxide 20 % ointment Apply 1 application topically as needed (for  redness- to be applied to buttocks/peri area and after every incontinent episode).   No facility-administered encounter medications on file as of 04/03/2022.    Review of Systems  Constitutional:  Negative for appetite change, chills, fatigue and fever.  HENT:  Negative for congestion, hearing loss, rhinorrhea and sore throat.   Eyes: Negative.   Respiratory:  Negative for cough, shortness of breath and wheezing.   Cardiovascular:  Negative for chest pain, palpitations and leg swelling.  Gastrointestinal:  Negative for abdominal pain, constipation, diarrhea, nausea and vomiting.  Genitourinary:  Positive for frequency. Negative for dysuria.       Increase in urinary frequency  Musculoskeletal:  Negative for arthralgias, back pain and myalgias.  Skin:  Negative for color change, rash and wound.  Neurological:  Negative for dizziness, weakness and headaches.  Psychiatric/Behavioral:  Negative for behavioral problems. The patient is not nervous/anxious.     Immunization History  Administered Date(s) Administered   Influenza Split 11/13/2012, 12/13/2012, 11/30/2013, 10/09/2014, 11/24/2018   Influenza, High Dose Seasonal PF 12/01/2013, 10/04/2014, 10/12/2015, 11/06/2016, 11/12/2017   Influenza,inj,Quad PF,6+ Mos 10/24/2010   Influenza-Unspecified 10/24/2010, 11/01/2019, 12/04/2020   Moderna Covid-19 Vaccine Bivalent Booster 77yr & up 07/24/2021   Moderna SARS-COV2 Booster Vaccination 07/18/2020   Moderna Sars-Covid-2 Vaccination 02/14/2019, 03/14/2019, 12/26/2019   PFIZER(Purple Top)SARS-COV-2 Vaccination 10/31/2020   PPD Test 11/27/2021   Pfizer Covid-19 Vaccine Bivalent Booster 179yr& up 10/31/2020   Pneumococcal Conjugate-13 09/28/2013   Pneumococcal Polysaccharide-23 03/05/2012   Td 10/16/2010   Td (Adult) 08/27/2000   Tdap 08/27/2000, 10/16/2010   Zoster, Live 01/11/2013, 03/31/2020   Pertinent  Health Maintenance Due  Topic Date Due   INFLUENZA VACCINE  09/10/2021   DEXA  SCAN  Completed      01/09/2022   12:58 PM 01/22/2022    7:00 AM 02/12/2022    4:30 PM 03/25/2022   11:07 AM 04/03/2022    3:37 PM  Fall Risk  Falls in the past year? 0  0 0 0  Was there an injury with Fall? 1  0 0 0  Fall Risk Category Calculator 1  0 0 0  Fall Risk Category (Retired) Low  Low    (RETIRED) Patient Fall Risk Level High fall risk High fall risk High fall risk    Patient at Risk for Falls Due to History of fall(s);Impaired balance/gait;Impaired mobility;Mental status change  History of fall(s);Impaired balance/gait;Impaired mobility;Mental status change History of fall(s);Impaired balance/gait;Impaired mobility;Mental status change History of fall(s);Impaired balance/gait;Impaired mobility;Mental status change  Fall risk Follow up Falls evaluation completed  Falls evaluation completed Falls evaluation completed Falls evaluation completed   Functional Status Survey:    Vitals:   04/03/22 1533  BP: (!) 146/77  Pulse: 85  Resp: 17  Temp: (!) 97.4 F (36.3 C)  SpO2: 94%  Weight: 191 lb 2 oz (86.7 kg)  Height: '5\' 3"'$  (1.6 m)   Body mass index is 33.86 kg/m. Physical Exam Constitutional:      General: She is not in acute distress.    Appearance: Normal appearance.  HENT:     Head: Normocephalic and atraumatic.     Nose: Nose normal.     Mouth/Throat:     Mouth: Mucous membranes are moist.  Eyes:     Conjunctiva/sclera: Conjunctivae normal.  Cardiovascular:     Rate and Rhythm: Normal rate and regular rhythm.  Pulmonary:     Effort: Pulmonary effort is normal.     Breath sounds: Normal breath sounds.  Abdominal:     General: Bowel sounds are normal.     Palpations: Abdomen is soft.  Musculoskeletal:        General: Normal range of motion.     Cervical back: Normal range of motion.  Skin:    General: Skin is warm and dry.  Neurological:     General: No focal deficit present.     Comments: Alert to self and place, disoriented to time.  Psychiatric:         Mood and Affect: Mood normal.     Labs reviewed: Recent Labs    11/24/21 1023 11/26/21 0420 01/22/22 0925  NA 135 135 138  K 3.9 3.7 4.5  CL 101 104  104  CO2 '26 23 25  '$ GLUCOSE 122* 109* 125*  BUN 22 29* 21  CREATININE 1.20* 1.23* 1.23*  CALCIUM 9.5 8.7* 9.3   Recent Labs    05/27/21 0000 05/27/21 1244 11/24/21 1023 01/22/22 0925  AST  --  12* 23 26  ALT  --  '18 16 16  '$ ALKPHOS  --  63 67 68  BILITOT  --   --  0.6 0.6  PROT  --   --  8.5* 7.1  ALBUMIN 3.4*  --  4.2 3.5   Recent Labs    09/12/21 0000 11/24/21 1023 11/26/21 0420 01/22/22 0925  WBC 6.5 8.2 8.9 7.8  NEUTROABS 4,381.00 6.6  --  6.3  HGB 13.9 13.9 12.7 12.6  HCT 43 44.4 40.1 40.6  MCV  --  84.6 84.2 84.1  PLT 212 237 202 191   Lab Results  Component Value Date   TSH 3.19 03/27/2022   Lab Results  Component Value Date   HGBA1C 5.7 (H) 11/24/2021   Lab Results  Component Value Date   CHOL 181 11/25/2021   HDL 37 (L) 11/25/2021   LDLCALC 127 (H) 11/25/2021   TRIG 85 11/25/2021   CHOLHDL 4.9 11/25/2021    Significant Diagnostic Results in last 30 days:  No results found.  Assessment/Plan  1. Urinary frequency -  urine has a strong odor -  will get urine sample for UA w/ CS to rule out UTI which is probably causing increase in confusion  2. OAB (overactive bladder) -   continue Myrbetriq   Family/ staff Communication:  Discussed plan of care with resident and charge nurse.  Labs/tests ordered:   Urinalysis with culture and sensitivity, CBC with differentials and BMP with GFR

## 2022-04-08 LAB — CBC AND DIFFERENTIAL
HCT: 38 (ref 36–46)
Hemoglobin: 12.4 (ref 12.0–16.0)
Neutrophils Absolute: 3833
Platelets: 224 10*3/uL (ref 150–400)
WBC: 7.3

## 2022-04-08 LAB — BASIC METABOLIC PANEL
BUN: 24 — AB (ref 4–21)
CO2: 24 — AB (ref 13–22)
Chloride: 104 (ref 99–108)
Creatinine: 1.2 — AB (ref 0.5–1.1)
Glucose: 96
Potassium: 4.3 mEq/L (ref 3.5–5.1)
Sodium: 139 (ref 137–147)

## 2022-04-08 LAB — COMPREHENSIVE METABOLIC PANEL: Calcium: 9.3 (ref 8.7–10.7)

## 2022-04-08 LAB — CBC: RBC: 4.62 (ref 3.87–5.11)

## 2022-04-14 ENCOUNTER — Encounter: Payer: Self-pay | Admitting: Nurse Practitioner

## 2022-04-14 ENCOUNTER — Non-Acute Institutional Stay (SKILLED_NURSING_FACILITY): Payer: Medicare Other | Admitting: Nurse Practitioner

## 2022-04-14 DIAGNOSIS — R350 Frequency of micturition: Secondary | ICD-10-CM

## 2022-04-14 DIAGNOSIS — F0390 Unspecified dementia without behavioral disturbance: Secondary | ICD-10-CM

## 2022-04-14 DIAGNOSIS — N1831 Chronic kidney disease, stage 3a: Secondary | ICD-10-CM

## 2022-04-14 DIAGNOSIS — I10 Essential (primary) hypertension: Secondary | ICD-10-CM | POA: Diagnosis not present

## 2022-04-14 DIAGNOSIS — E039 Hypothyroidism, unspecified: Secondary | ICD-10-CM

## 2022-04-14 DIAGNOSIS — M159 Polyosteoarthritis, unspecified: Secondary | ICD-10-CM

## 2022-04-14 DIAGNOSIS — E785 Hyperlipidemia, unspecified: Secondary | ICD-10-CM

## 2022-04-14 DIAGNOSIS — I4892 Unspecified atrial flutter: Secondary | ICD-10-CM

## 2022-04-14 NOTE — Progress Notes (Signed)
Location:   SNF [place] Room Number: 43A Place of Service:  SNF (31) Provider: Lennie Odor Patrisia Faeth NP  Veroncia Jezek X, NP  Patient Care Team: Lopaka Karge X, NP as PCP - General (Internal Medicine) Donato Heinz, MD as PCP - Cardiology (Cardiology) Brien Few, MD as Consulting Physician (Obstetrics and Gynecology) Marti Sleigh, MD as Consulting Physician (Gynecology) Nancy Marus, MD as Consulting Physician (Gynecologic Oncology)  Extended Emergency Contact Information Primary Emergency Contact: [facility] Address: [address]          [place], [place] [address] [name] of [place] Phone: [phone] Mobile Phone: [phone] Relation: Daughter  Code Status: DNR Goals of care: Advanced Directive information    04/03/2022    3:37 PM  Advanced Directives  Does Patient Have a Medical Advance Directive? Yes  Type of Paramedic of [place];Living will  Does patient want to make changes to medical advance directive? No - Patient declined  Copy of [place] in Chart? Yes - validated most recent copy scanned in chart (See row information)     Chief Complaint  Patient presents with  . Acute Visit    Reported the patient c/o R hip pain    HPI:  Pt is a 89 y.o. female seen today for an acute visit for    Past Medical History:  Diagnosis Date  . Anemia associated with acute blood loss 04/03/2011  . Arthritis    knees   . Ascites   . Blood transfusion    hx of 2011   . Complication of anesthesia    Sodium drops per pt   . Cystitis   . DIARRHEA, ANTIBIOTIC ASSOCIATED 05/31/2009   Qualifier: Diagnosis of  By: Tommy Medal MD, Roderic Scarce    . GERD (gastroesophageal reflux disease)   . H/O hiatal hernia   . Hyperlipidemia   . Hypertension   . Hyponatremia   . Neutropenia with fever ([place]) 05/18/2011  . OSTEOMYELITIS, CHRONIC, LOWER LEG 04/23/2009   Qualifier: Diagnosis of  By: Johnnye Sima MD, Dellis Filbert     . Osteopenia   . OSTEOPOROSIS 04/23/2009   Qualifier: Diagnosis of  By: Johnnye Sima MD, Dellis Filbert    . Ovarian cancer ([place]) 01/25/2011  . Pneumonia    hx of   . Postmenopausal atrophic vaginitis   . PROSTHETIC JOINT COMPLICATION [id number]   Qualifier: Diagnosis of  By: Tommy Medal MD, Roderic Scarce    . Renal disorder    Decreased kidney function  . Staph infection 2010   after knee replacement  . Urinary frequency   . Vulvitis    Past Surgical History:  Procedure Laterality Date  . ABDOMINAL HYSTERECTOMY  04/01/2011   Procedure: HYSTERECTOMY ABDOMINAL;  Surgeon: Alvino Chapel, MD;  Location: [facility];  Service: Gynecology;  Laterality: N/A;  . APPENDECTOMY    . JOINT REPLACEMENT     R knee in 2008, 5 operations on L knee  . LAPAROTOMY  04/01/2011   Procedure: EXPLORATORY LAPAROTOMY;  Surgeon: Alvino Chapel, MD;  Location: [facility];  Service: Gynecology;  Laterality: N/A;  . OTHER SURGICAL HISTORY     hx of C section 1966  . SALPINGOOPHORECTOMY  04/01/2011   Procedure: SALPINGO OOPHERECTOMY;  Surgeon: Alvino Chapel, MD;  Location: [facility];  Service: Gynecology;  Laterality: Bilateral;  . TONSILLECTOMY      Allergies  Allergen Reactions  . Morphine And Related Anaphylaxis and Other (See Comments)    Given after knee replacement and  code blue occurred  . Morphine Sulfate Anaphylaxis  . Penicillins Hives, Itching and Other (See Comments)    Syncope, also  . Nsaids Other (See Comments)    Because of an abnormal kidney test result  . Penicillin V Hives  . Shellfish Allergy Nausea And Vomiting and Other (See Comments)    Pt has shellfish allergy only.  Has had IV contrast x 2 and did fine.  Marland Kitchen Shellfish-Derived Products Nausea And Vomiting    Allergies as of 04/14/2022       Reactions   Morphine And Related Anaphylaxis, Other (See Comments)   Given after knee replacement and code blue occurred   Morphine Sulfate Anaphylaxis   Penicillins Hives, Itching, Other (See  Comments)   Syncope, also   Nsaids Other (See Comments)   Because of an abnormal kidney test result   Penicillin V Hives   Shellfish Allergy Nausea And Vomiting, Other (See Comments)   Pt has shellfish allergy only.  Has had IV contrast x 2 and did fine.   Shellfish-derived Products Nausea And Vomiting        Medication List        Accurate as of April 14, 2022  5:34 PM. If you have any questions, ask your nurse or doctor.          acetaminophen 500 MG tablet Commonly known as: TYLENOL Take 1,000 mg by mouth every 8 (eight) hours as needed.   apixaban 5 MG Tabs tablet Commonly known as: ELIQUIS Take 1 tablet (5 mg total) by mouth 2 (two) times daily.   atorvastatin 40 MG tablet Commonly known as: LIPITOR Take 1 tablet (40 mg total) by mouth daily.   azelastine 0.1 % nasal spray Commonly known as: ASTELIN Place 1 spray into both nostrils daily as needed for rhinitis or allergies.   B-COMPLEX/FOLIC ACID/VITAMIN C PO Take 1 tablet by mouth daily with breakfast.   bimatoprost 0.03 % ophthalmic solution Commonly known as: LUMIGAN Place 1 drop into both eyes at bedtime.   calcium carbonate 750 MG chewable tablet Commonly known as: TUMS EX Chew 750 mg by mouth daily as needed for heartburn.   cycloSPORINE 0.05 % ophthalmic emulsion Commonly known as: RESTASIS Place 1 drop into both eyes 2 (two) times daily.   diclofenac Sodium 1 % Gel Commonly known as: VOLTAREN Apply 2 g topically in the morning and at bedtime.   docusate sodium 100 MG capsule Commonly known as: COLACE Take 100 mg by mouth daily.   levothyroxine 25 MCG tablet Commonly known as: SYNTHROID Take 25 mcg by mouth daily before breakfast.   meclizine 12.5 MG tablet Commonly known as: ANTIVERT Take 1 tablet (12.5 mg total) by mouth 2 (two) times daily as needed for dizziness.   melatonin 3 MG Tabs tablet Take 1 tablet (3 mg total) by mouth at bedtime.   miconazole 2 % powder Commonly known  as: MICOTIN Apply topically daily. Patient may apply to dry skin under breasts daily and after showering   Myrbetriq 25 MG Tb24 tablet Generic drug: mirabegron ER Take 25 mg by mouth daily.   nystatin powder Commonly known as: MYCOSTATIN/NYSTOP Apply 1 application topically 2 (two) times daily as needed.   pantoprazole 40 MG tablet Commonly known as: PROTONIX Take 40 mg by mouth daily.   sodium chloride 1 g tablet Take 1 g by mouth 2 (two) times daily.   Vitamin D3 50 MCG (2000 UT) Tabs Generic drug: Cholecalciferol Take 2,000 Units by mouth in  the morning.   zinc oxide 20 % ointment Apply 1 application topically as needed (for redness- to be applied to buttocks/peri area and after every incontinent episode).        Review of Systems  Immunization History  Administered Date(s) Administered  . Influenza Split 11/13/2012, 12/13/2012, 11/30/2013, 10/09/2014, 11/24/2018  . Influenza, High Dose Seasonal PF 12/01/2013, 10/04/2014, 10/12/2015, 11/06/2016, 11/12/2017  . Influenza,inj,Quad PF,6+ Mos 10/24/2010  . Influenza-Unspecified 10/24/2010, 11/01/2019, 12/04/2020  . Moderna Covid-19 Vaccine Bivalent Booster 76yr & up 07/24/2021  . Moderna SARS-COV2 Booster Vaccination 07/18/2020  . Moderna Sars-Covid-2 Vaccination 02/14/2019, 03/14/2019, 12/26/2019  . PFIZER(Purple Top)SARS-COV-2 Vaccination 10/31/2020  . PPD Test 11/27/2021  . PPension scheme manager177yr& up 10/31/2020  . Pneumococcal Conjugate-13 09/28/2013  . Pneumococcal Polysaccharide-23 03/05/2012  . Td 10/16/2010  . Td (Adult) 08/27/2000  . Tdap 08/27/2000, 10/16/2010  . Zoster, Live 01/11/2013, 03/31/2020   Pertinent  Health Maintenance Due  Topic Date Due  . INFLUENZA VACCINE  09/10/2021  . DEXA SCAN  Completed      01/09/2022   12:58 PM 01/22/2022    7:00 AM 02/12/2022    4:30 PM 03/25/2022   11:07 AM 04/03/2022    3:37 PM  Fall Risk  Falls in the past year? 0  0 0 0  Was there  an injury with Fall? 1  0 0 0  Fall Risk Category Calculator 1  0 0 0  Fall Risk Category (Retired) Low  Low    (RETIRED) Patient Fall Risk Level High fall risk High fall risk High fall risk    Patient at Risk for Falls Due to History of fall(s);Impaired balance/gait;Impaired mobility;Mental status change  History of fall(s);Impaired balance/gait;Impaired mobility;Mental status change History of fall(s);Impaired balance/gait;Impaired mobility;Mental status change History of fall(s);Impaired balance/gait;Impaired mobility;Mental status change  Fall risk Follow up Falls evaluation completed  Falls evaluation completed Falls evaluation completed Falls evaluation completed   Functional Status Survey:    Vitals:   04/14/22 1730  BP: 135/64  Pulse: 83  Resp: 18  Temp: 98.3 F (36.8 C)  SpO2: 95%  Weight: 188 lb 1.6 oz (85.3 kg)   Body mass index is 33.32 kg/m. Physical Exam  Labs reviewed: Recent Labs    11/24/21 1023 11/26/21 0420 01/22/22 0925  NA 135 135 138  K 3.9 3.7 4.5  CL 101 104 104  CO2 '26 23 25  '$ GLUCOSE 122* 109* 125*  BUN 22 29* 21  CREATININE 1.20* 1.23* 1.23*  CALCIUM 9.5 8.7* 9.3   Recent Labs    05/27/21 0000 05/27/21 1244 11/24/21 1023 01/22/22 0925  AST  --  12* 23 26  ALT  --  '18 16 16  '$ ALKPHOS  --  63 67 68  BILITOT  --   --  0.6 0.6  PROT  --   --  8.5* 7.1  ALBUMIN 3.4*  --  4.2 3.5   Recent Labs    09/12/21 0000 11/24/21 1023 11/26/21 0420 01/22/22 0925  WBC 6.5 8.2 8.9 7.8  NEUTROABS 4,381.00 6.6  --  6.3  HGB 13.9 13.9 12.7 12.6  HCT 43 44.4 40.1 40.6  MCV  --  84.6 84.2 84.1  PLT 212 237 202 191   Lab Results  Component Value Date   TSH 3.19 03/27/2022   Lab Results  Component Value Date   HGBA1C 5.7 (H) 11/24/2021   Lab Results  Component Value Date   CHOL 181 11/25/2021   HDL 37 (  L) 11/25/2021   LDLCALC 127 (H) 11/25/2021   TRIG 85 11/25/2021   CHOLHDL 4.9 11/25/2021    Significant Diagnostic Results in last 30  days:  No results found.  Assessment/Plan: No problem-specific Assessment & Plan notes found for this encounter.    Family/ staff Communication: plan of care reviewed with the patient and charge nurse.   Labs/tests ordered:  none  Time spend 35 minutes.

## 2022-04-15 NOTE — Assessment & Plan Note (Addendum)
chronic R ankle pain, uses ankle brace, takes Tylenol, ambulates with walker slowly,  w/c to go further.  04/14/22  reported the right hip pain over the weekend. The patient ambulated with walker slowly, denied in in hips or lower back upon my examination.

## 2022-04-15 NOTE — Assessment & Plan Note (Signed)
,   f/u Cardiology, off anticoagulation 2/2 falls in the past, restarted Eliquis after acute CVA 11/24/21

## 2022-04-15 NOTE — Assessment & Plan Note (Signed)
on Atorvastatin, LDL 127 11/25/21

## 2022-04-15 NOTE — Assessment & Plan Note (Signed)
No behavioral issue,   SNF [facility] for supportive care, 01/29/22 MMSE 23/30

## 2022-04-15 NOTE — Assessment & Plan Note (Signed)
Blood pressure is controlled,  takes Hydralazine

## 2022-04-15 NOTE — Assessment & Plan Note (Signed)
on Mirabegron, no change

## 2022-04-15 NOTE — Assessment & Plan Note (Signed)
takes Levothyroxine, TSH 3.19 03/27/22

## 2022-04-15 NOTE — Assessment & Plan Note (Signed)
Bun/creat 21/1.23 01/22/22

## 2022-04-18 ENCOUNTER — Encounter: Payer: Self-pay | Admitting: Nurse Practitioner

## 2022-04-18 ENCOUNTER — Non-Acute Institutional Stay (SKILLED_NURSING_FACILITY): Payer: Medicare Other | Admitting: Nurse Practitioner

## 2022-04-18 DIAGNOSIS — F0390 Unspecified dementia without behavioral disturbance: Secondary | ICD-10-CM

## 2022-04-18 DIAGNOSIS — M159 Polyosteoarthritis, unspecified: Secondary | ICD-10-CM | POA: Diagnosis not present

## 2022-04-18 DIAGNOSIS — I10 Essential (primary) hypertension: Secondary | ICD-10-CM | POA: Diagnosis not present

## 2022-04-18 DIAGNOSIS — I4892 Unspecified atrial flutter: Secondary | ICD-10-CM

## 2022-04-18 DIAGNOSIS — N1831 Chronic kidney disease, stage 3a: Secondary | ICD-10-CM

## 2022-04-18 DIAGNOSIS — R350 Frequency of micturition: Secondary | ICD-10-CM | POA: Diagnosis not present

## 2022-04-18 DIAGNOSIS — E785 Hyperlipidemia, unspecified: Secondary | ICD-10-CM | POA: Diagnosis not present

## 2022-04-18 DIAGNOSIS — E039 Hypothyroidism, unspecified: Secondary | ICD-10-CM

## 2022-04-18 NOTE — Assessment & Plan Note (Signed)
SNF [facility] for supportive care, 01/29/22 MMSE 23/30

## 2022-04-18 NOTE — Assessment & Plan Note (Signed)
takes Levothyroxine, TSH 3.19 03/27/22 

## 2022-04-18 NOTE — Assessment & Plan Note (Signed)
on Atorvastatin, LDL 127 11/25/21 

## 2022-04-18 NOTE — Assessment & Plan Note (Signed)
chronic R ankle pain, uses ankle brace, takes Tylenol, ambulates with walker slowly,  w/c to go further

## 2022-04-18 NOTE — Assessment & Plan Note (Signed)
Blood pressure is controlled,  takes Hydralazine  

## 2022-04-18 NOTE — Assessment & Plan Note (Signed)
Bun/creat 24/1.2 04/08/22

## 2022-04-18 NOTE — Assessment & Plan Note (Signed)
f/u Cardiology, off anticoagulation 2/2 falls in the past, restarted Eliquis after acute CVA 11/24/21 

## 2022-04-18 NOTE — Progress Notes (Signed)
Location:  [facility] Room Number: 43A Place of Service:  SNF (31) Provider: Floyce Bujak X, NP  Patient Care Team: Tiombe Tomeo X, NP as PCP - General (Internal Medicine) Donato Heinz, MD as PCP - Cardiology (Cardiology) Brien Few, MD as Consulting Physician (Obstetrics and Gynecology) Marti Sleigh, MD as Consulting Physician (Gynecology) Nancy Marus, MD as Consulting Physician (Gynecologic Oncology)  Extended Emergency Contact Information Primary Emergency Contact: [facility] Address: [address]          [place], [place] [address] [name] of [place] Phone: [phone] Mobile Phone: [phone] Relation: Daughter  Code Status:  Full Code Goals of care: Advanced Directive information    04/18/2022    9:20 AM  Advanced Directives  Does Patient Have a Medical Advance Directive? Yes  Type of Paramedic of [place];Living will  Does patient want to make changes to medical advance directive? No - Patient declined  Copy of [place] in Chart? Yes - validated most recent copy scanned in chart (See row information)     Chief Complaint  Patient presents with   Medical Management of Chronic Issues    Routine Visit   Quality Metric Gaps    Needs to discuss the vaccine for the following: DTAP, Influenza, Covid.    HPI:  Pt is a 89 y.o. female seen today for medical management of chronic diseases.    11/24/21 acute CVA left PCA embolic strokes, no apparent residual symptoms, on Eliquis f/u Neurology             CKD, Bun/creat 24/1.2 04/08/22             Hypothyroidism, takes Levothyroxine, TSH 3.19 03/27/22             Hyponatremia, Na 139 04/08/22             Syncope: Orthostasis ?, no further recurrence since admitted to SNF [facility]             A flutter, f/u Cardiology, off anticoagulation 2/2 falls in the past, restarted Eliquis after acute CVA 11/24/21             Hx of  ovarian CA s/p TAH/BSO 03/2011, in remission.             Senile dementia,  SNF [facility] for supportive care, 01/29/22 MMSE 23/30             Constipation takes Colace.              HTN, takes Hydralazine              OP, takes Ca, Vit D             Insomnia, off Ambien.              Hyperlipidemia, on Atorvastatin, LDL 127 11/25/21             OA, chronic R ankle pain, uses ankle brace, takes Tylenol, ambulates with walker slowly,  w/c to go further             Urinary frequency, on Mirabegron         Past Medical History:  Diagnosis Date   Anemia associated with acute blood loss 04/03/2011   Arthritis    knees    Ascites    Blood transfusion    hx of [id number]    Complication of anesthesia    Sodium drops per pt  Cystitis    DIARRHEA, ANTIBIOTIC ASSOCIATED 05/31/2009   Qualifier: Diagnosis of  By: Tommy Medal MD, Roderic Scarce     GERD (gastroesophageal reflux disease)    H/O hiatal hernia    Hyperlipidemia    Hypertension    Hyponatremia    Neutropenia with fever ([place]) 05/18/2011   OSTEOMYELITIS, CHRONIC, LOWER LEG 04/23/2009   Qualifier: Diagnosis of  By: Johnnye Sima MD, Felecia Shelling    OSTEOPOROSIS 04/23/2009   Qualifier: Diagnosis of  By: Johnnye Sima MD, Jeffrey     Ovarian cancer ([place]) 01/25/2011   Pneumonia    hx of    Postmenopausal atrophic vaginitis    PROSTHETIC JOINT COMPLICATION [id number]   Qualifier: Diagnosis of  By: Tommy Medal MD, Cornelius     Renal disorder    Decreased kidney function   Staph infection 2010   after knee replacement   Urinary frequency    Vulvitis    Past Surgical History:  Procedure Laterality Date   ABDOMINAL HYSTERECTOMY  04/01/2011   Procedure: HYSTERECTOMY ABDOMINAL;  Surgeon: Alvino Chapel, MD;  Location: [facility];  Service: Gynecology;  Laterality: N/A;   APPENDECTOMY     JOINT REPLACEMENT     R knee in 2008, 5 operations on L knee   LAPAROTOMY  04/01/2011   Procedure: EXPLORATORY LAPAROTOMY;  Surgeon: Alvino Chapel, MD;   Location: [facility];  Service: Gynecology;  Laterality: N/A;   OTHER SURGICAL HISTORY     hx of C section 1966   SALPINGOOPHORECTOMY  04/01/2011   Procedure: SALPINGO OOPHERECTOMY;  Surgeon: Alvino Chapel, MD;  Location: [facility];  Service: Gynecology;  Laterality: Bilateral;   TONSILLECTOMY      Allergies  Allergen Reactions   Morphine And Related Anaphylaxis and Other (See Comments)    Given after knee replacement and code blue occurred   Morphine Sulfate Anaphylaxis   Penicillins Hives, Itching and Other (See Comments)    Syncope, also   Nsaids Other (See Comments)    Because of an abnormal kidney test result   Penicillin V Hives   Shellfish Allergy Nausea And Vomiting and Other (See Comments)    Pt has shellfish allergy only.  Has had IV contrast x 2 and did fine.   Shellfish-Derived Products Nausea And Vomiting    Outpatient Encounter Medications as of 04/18/2022  Medication Sig   acetaminophen (TYLENOL) 500 MG tablet Take 1,000 mg by mouth every 8 (eight) hours as needed.   amLODipine (NORVASC) 2.5 MG tablet Take 2.5 mg by mouth daily.   apixaban (ELIQUIS) 5 MG TABS tablet Take 1 tablet (5 mg total) by mouth 2 (two) times daily.   atorvastatin (LIPITOR) 40 MG tablet Take 1 tablet (40 mg total) by mouth daily.   azelastine (ASTELIN) 0.1 % nasal spray Place 1 spray into both nostrils daily as needed for rhinitis or allergies.   B Complex-C-Folic Acid (B-COMPLEX/FOLIC ACID/VITAMIN C PO) Take 1 tablet by mouth daily with breakfast.   bimatoprost (LUMIGAN) 0.03 % ophthalmic solution Place 1 drop into both eyes at bedtime.   calcium carbonate (TUMS EX) 750 MG chewable tablet Chew 750 mg by mouth daily as needed for heartburn.   Cholecalciferol (VITAMIN D3) 50 MCG (2000 UT) TABS Take 2,000 Units by mouth in the morning.   cycloSPORINE (RESTASIS) 0.05 % ophthalmic emulsion Place 1 drop into both eyes 2 (two) times daily.   diclofenac Sodium (VOLTAREN) 1 % GEL Apply 2 g topically in  the morning  and at bedtime.   docusate sodium (COLACE) 100 MG capsule Take 100 mg by mouth daily.   levothyroxine (SYNTHROID) 25 MCG tablet Take 25 mcg by mouth daily before breakfast.   meclizine (ANTIVERT) 12.5 MG tablet Take 1 tablet (12.5 mg total) by mouth 2 (two) times daily as needed for dizziness.   melatonin 3 MG TABS tablet Take 1 tablet (3 mg total) by mouth at bedtime.   miconazole (MICOTIN) 2 % powder Apply topically daily. Patient may apply to dry skin under breasts daily and after showering   mirabegron ER (MYRBETRIQ) 25 MG TB24 tablet Take 25 mg by mouth daily.   nystatin (MYCOSTATIN/NYSTOP) powder Apply 1 application topically 2 (two) times daily as needed.   pantoprazole (PROTONIX) 40 MG tablet Take 40 mg by mouth daily.   sodium chloride 1 g tablet Take 1 g by mouth 2 (two) times daily.   zinc oxide 20 % ointment Apply 1 application topically as needed (for redness- to be applied to buttocks/peri area and after every incontinent episode).   No facility-administered encounter medications on file as of 04/18/2022.    Review of Systems  Constitutional:  Negative for appetite change and fever.  HENT:  Positive for hearing loss. Negative for congestion and trouble swallowing.   Eyes:  Negative for visual disturbance.  Respiratory:  Negative for cough and shortness of breath.   Cardiovascular:  Negative for leg swelling.  Gastrointestinal:  Negative for abdominal pain and constipation.  Genitourinary:  Positive for frequency. Negative for difficulty urinating, dysuria, flank pain and urgency.  Musculoskeletal:  Positive for arthralgias, back pain and gait problem.       R ankle pain is chronic  Skin:  Negative for color change.  Neurological:  Negative for speech difficulty, weakness and light-headedness.  Psychiatric/Behavioral:  Negative for confusion and sleep disturbance. The patient is not nervous/anxious.     Immunization History  Administered Date(s) Administered    Influenza Split 11/13/2012, 12/13/2012, 11/30/2013, 10/09/2014, 11/24/2018   Influenza, High Dose Seasonal PF 12/01/2013, 10/04/2014, 10/12/2015, 11/06/2016, 11/12/2017   Influenza,inj,Quad PF,6+ Mos 10/24/2010   Influenza-Unspecified 10/24/2010, 11/01/2019, 12/04/2020   Moderna Covid-19 Vaccine Bivalent Booster 78yr & up 07/24/2021   Moderna SARS-COV2 Booster Vaccination 07/18/2020   Moderna Sars-Covid-2 Vaccination 02/14/2019, 03/14/2019, 12/26/2019   PFIZER(Purple Top)SARS-COV-2 Vaccination 10/31/2020   PPD Test 11/27/2021   Pfizer Covid-19 Vaccine Bivalent Booster 187yr& up 10/31/2020   Pneumococcal Conjugate-13 09/28/2013   Pneumococcal Polysaccharide-23 03/05/2012   Td 10/16/2010   Td (Adult) 08/27/2000   Tdap 08/27/2000, 10/16/2010   Zoster, Live 01/11/2013, 03/31/2020   Pertinent  Health Maintenance Due  Topic Date Due   INFLUENZA VACCINE  09/10/2021   DEXA SCAN  Completed      01/22/2022    7:00 AM 02/12/2022    4:30 PM 03/25/2022   11:07 AM 04/03/2022    3:37 PM 04/18/2022    9:19 AM  [place] the past year?  0 0 0 0  Was there an injury with Fall?  0 0 0 0  Fall Risk Category Calculator  0 0 0 0  Fall Risk Category (Retired)  Low     (RETIRED) Patient Fall Risk Level High fall risk High fall risk     Patient at Risk for Falls Due to  History of fall(s);Impaired balance/gait;Impaired mobility;Mental status change History of fall(s);Impaired balance/gait;Impaired mobility;Mental status change History of fall(s);Impaired balance/gait;Impaired mobility;Mental status change History of fall(s);Impaired balance/gait;Impaired mobility;Mental status change  Fall  risk Follow up  Falls evaluation completed Falls evaluation completed Falls evaluation completed Falls evaluation completed   Functional Status Survey:    Vitals:   04/18/22 0908  BP: (!) 142/87  Pulse: 82  Resp: 18  Temp: 98.3 F (36.8 C)  SpO2: 95%  Weight: 188 lb 1 oz (85.3 kg)  Height: '5\' 3"'$  (1.6  m)   Body mass index is 33.31 kg/m. Physical Exam Vitals and nursing note reviewed.  Constitutional:      Appearance: Normal appearance.  HENT:     Head: Normocephalic and atraumatic.     Mouth/Throat:     Mouth: Mucous membranes are moist.  Eyes:     Extraocular Movements: Extraocular movements intact.     Conjunctiva/sclera: Conjunctivae normal.     Pupils: Pupils are equal, round, and reactive to light.  Cardiovascular:     Rate and Rhythm: Normal rate. Rhythm irregular.     Heart sounds: No murmur heard. Pulmonary:     Effort: Pulmonary effort is normal.     Breath sounds: No rales.  Abdominal:     General: Bowel sounds are normal.     Palpations: Abdomen is soft.     Tenderness: There is no abdominal tenderness.     Hernia: A hernia is present.     Comments: Incision hernia  Musculoskeletal:        General: Tenderness present.     Cervical back: Normal range of motion and neck supple.     Right lower leg: No edema.     Left lower leg: No edema.     Comments: Right ankle brace, chronic pain.  Skin:    General: Skin is warm and dry.  Neurological:     General: No focal deficit present.     Mental Status: She is alert and oriented to person, place, and time. Mental status is at baseline.     Motor: No weakness.     Gait: Gait abnormal.  Psychiatric:        Mood and Affect: Mood normal.        Behavior: Behavior normal.        Thought Content: Thought content normal.     Labs reviewed: Recent Labs    11/24/21 1023 11/26/21 0420 01/22/22 0925 04/08/22 0755  NA 135 135 138 139  K 3.9 3.7 4.5 4.3  CL 101 104 104 104  CO2 '26 23 25 '$ 24*  GLUCOSE 122* 109* 125*  --   BUN 22 29* 21 24*  CREATININE 1.20* 1.23* 1.23* 1.2*  CALCIUM 9.5 8.7* 9.3 9.3   Recent Labs    05/27/21 0000 05/27/21 1244 11/24/21 1023 01/22/22 0925  AST  --  12* 23 26  ALT  --  '18 16 16  '$ ALKPHOS  --  63 67 68  BILITOT  --   --  0.6 0.6  PROT  --   --  8.5* 7.1  ALBUMIN 3.4*  --   4.2 3.5   Recent Labs    11/24/21 1023 11/26/21 0420 01/22/22 0925 04/08/22 0755  WBC 8.2 8.9 7.8 7.3  NEUTROABS 6.6  --  6.3 3,833.00  HGB 13.9 12.7 12.6 12.4  HCT 44.4 40.1 40.6 38  MCV 84.6 84.2 84.1  --   PLT 237 202 191 224   Lab Results  Component Value Date   TSH 3.19 03/27/2022   Lab Results  Component Value Date   HGBA1C 5.7 (H) 11/24/2021   Lab Results  Component Value  Date   CHOL 181 11/25/2021   HDL 37 (L) 11/25/2021   LDLCALC 127 (H) 11/25/2021   TRIG 85 11/25/2021   CHOLHDL 4.9 11/25/2021    Significant Diagnostic Results in last 30 days:  No results found.  Assessment/Plan HLD (hyperlipidemia)  on Atorvastatin, LDL 127 11/25/21  Osteoarthritis chronic R ankle pain, uses ankle brace, takes Tylenol, ambulates with walker slowly,  w/c to go further  Urinary frequency No change, on Mirabegron  Essential hypertension Blood pressure is controlled,  takes Hydralazine   Senile dementia ([place]) SNF [facility] for supportive care, 01/29/22 MMSE 23/30  Atrial flutter ([place]) f/u Cardiology, off anticoagulation 2/2 falls in the past, restarted Eliquis after acute CVA 11/24/21  CKD (chronic kidney disease) stage 3, GFR 30-59 ml/min ([place])  Bun/creat 24/1.2 04/08/22     Family/ staff Communication: plan of care reviewed with the patient and charge nurse.   Labs/tests ordered:  none  Time spend 35 minutes.

## 2022-04-18 NOTE — Assessment & Plan Note (Signed)
No change,  on Mirabegron 

## 2022-04-22 ENCOUNTER — Other Ambulatory Visit: Payer: Self-pay | Admitting: Nurse Practitioner

## 2022-05-05 ENCOUNTER — Non-Acute Institutional Stay (SKILLED_NURSING_FACILITY): Payer: Medicare Other | Admitting: Nurse Practitioner

## 2022-05-05 ENCOUNTER — Encounter: Payer: Self-pay | Admitting: Nurse Practitioner

## 2022-05-05 DIAGNOSIS — F0390 Unspecified dementia without behavioral disturbance: Secondary | ICD-10-CM

## 2022-05-05 DIAGNOSIS — E785 Hyperlipidemia, unspecified: Secondary | ICD-10-CM

## 2022-05-05 DIAGNOSIS — K5901 Slow transit constipation: Secondary | ICD-10-CM

## 2022-05-05 DIAGNOSIS — M159 Polyosteoarthritis, unspecified: Secondary | ICD-10-CM | POA: Diagnosis not present

## 2022-05-05 DIAGNOSIS — R350 Frequency of micturition: Secondary | ICD-10-CM | POA: Diagnosis not present

## 2022-05-05 DIAGNOSIS — I10 Essential (primary) hypertension: Secondary | ICD-10-CM | POA: Diagnosis not present

## 2022-05-05 DIAGNOSIS — I4892 Unspecified atrial flutter: Secondary | ICD-10-CM

## 2022-05-05 DIAGNOSIS — E039 Hypothyroidism, unspecified: Secondary | ICD-10-CM

## 2022-05-05 NOTE — Assessment & Plan Note (Signed)
chronic R ankle pain, uses ankle brace, ambulates with walker slowly,  w/c to go further

## 2022-05-05 NOTE — Progress Notes (Signed)
Location:   [facility] Room Number: 87 Place of Service:  SNF (31) Provider: Lennie Odor Xaviera Flaten NP  Adriann Ballweg X, NP  Patient Care Team: Geovanny Sartin X, NP as PCP - General (Internal Medicine) Donato Heinz, MD as PCP - Cardiology (Cardiology) Brien Few, MD as Consulting Physician (Obstetrics and Gynecology) Marti Sleigh, MD as Consulting Physician (Gynecology) Nancy Marus, MD as Consulting Physician (Gynecologic Oncology)  Extended Emergency Contact Information Primary Emergency Contact: [facility] Address: [address]          [place], [place] [address] [name] of [place] Phone: [phone] Mobile Phone: [phone] Relation: Daughter  Code Status: DNR Goals of care: Advanced Directive information    04/18/2022    9:20 AM  Advanced Directives  Does Patient Have a Medical Advance Directive? Yes  Type of Paramedic of [place];Living will  Does patient want to make changes to medical advance directive? No - Patient declined  Copy of [place] in Chart? Yes - validated most recent copy scanned in chart (See row information)     Chief Complaint  Patient presents with   Acute Visit    Increased confusion, urinary frequency, and fatigue.     HPI:  Pt is a 89 y.o. female seen today for an acute visit for increased confusion-05/02/22 in [place] on her power chair, redirected back to SNF [facility], 05/03/22 the patient was noted to be in the apartment are, confused about where she was. The patient denied abd pain or dysuria or urinary urgency today.    11/24/21 acute CVA left PCA embolic strokes, no apparent residual symptoms, on Eliquis f/u Neurology             CKD, Bun/creat 24/1.2 04/08/22             Hypothyroidism, takes Levothyroxine, TSH 3.19 03/27/22             Hyponatremia, Na 139 04/08/22             Syncope: Orthostasis ?, no further recurrence since admitted to SNF [facility]              A flutter, f/u Cardiology, off anticoagulation 2/2 falls in the past, restarted Eliquis after acute CVA 11/24/21             Hx of ovarian CA s/p TAH/BSO 03/2011, in remission.             Senile dementia,  SNF [facility] for supportive care, 01/29/22 MMSE 23/30             Constipation takes Colace.              HTN, takes Hydralazine              OP, takes Ca, Vit D             Insomnia, off Ambien.              Hyperlipidemia, on Atorvastatin, LDL 127 11/25/21             OA, chronic R ankle pain, uses ankle brace,  ambulates with walker slowly,  w/c to go further             Urinary frequency, on Mirabegron   Past Medical History:  Diagnosis Date   Anemia associated with acute blood loss 04/03/2011   Arthritis    knees    Ascites    Blood transfusion    hx  of [id number]    Complication of anesthesia    Sodium drops per pt    Cystitis    DIARRHEA, ANTIBIOTIC ASSOCIATED 05/31/2009   Qualifier: Diagnosis of  By: Tommy Medal MD, Roderic Scarce     GERD (gastroesophageal reflux disease)    H/O hiatal hernia    Hyperlipidemia    Hypertension    Hyponatremia    Neutropenia with fever ([place]) 05/18/2011   OSTEOMYELITIS, CHRONIC, LOWER LEG 04/23/2009   Qualifier: Diagnosis of  By: Johnnye Sima MD, Felecia Shelling    OSTEOPOROSIS 04/23/2009   Qualifier: Diagnosis of  By: Johnnye Sima MD, Jeffrey     Ovarian cancer ([place]) 01/25/2011   Pneumonia    hx of    Postmenopausal atrophic vaginitis    PROSTHETIC JOINT COMPLICATION [id number]   Qualifier: Diagnosis of  By: Tommy Medal MD, Cornelius     Renal disorder    Decreased kidney function   Staph infection 2010   after knee replacement   Urinary frequency    Vulvitis    Past Surgical History:  Procedure Laterality Date   ABDOMINAL HYSTERECTOMY  04/01/2011   Procedure: HYSTERECTOMY ABDOMINAL;  Surgeon: Alvino Chapel, MD;  Location: [facility];  Service: Gynecology;  Laterality: N/A;   APPENDECTOMY     JOINT REPLACEMENT     R knee in 2008, 5 operations on L  knee   LAPAROTOMY  04/01/2011   Procedure: EXPLORATORY LAPAROTOMY;  Surgeon: Alvino Chapel, MD;  Location: [facility];  Service: Gynecology;  Laterality: N/A;   OTHER SURGICAL HISTORY     hx of C section 1966   SALPINGOOPHORECTOMY  04/01/2011   Procedure: SALPINGO OOPHERECTOMY;  Surgeon: Alvino Chapel, MD;  Location: [facility];  Service: Gynecology;  Laterality: Bilateral;   TONSILLECTOMY      Allergies  Allergen Reactions   Morphine And Related Anaphylaxis and Other (See Comments)    Given after knee replacement and code blue occurred   Morphine Sulfate Anaphylaxis   Penicillins Hives, Itching and Other (See Comments)    Syncope, also   Nsaids Other (See Comments)    Because of an abnormal kidney test result   Penicillin V Hives   Shellfish Allergy Nausea And Vomiting and Other (See Comments)    Pt has shellfish allergy only.  Has had IV contrast x 2 and did fine.   Shellfish-Derived Products Nausea And Vomiting    Allergies as of 05/05/2022       Reactions   Morphine And Related Anaphylaxis, Other (See Comments)   Given after knee replacement and code blue occurred   Morphine Sulfate Anaphylaxis   Penicillins Hives, Itching, Other (See Comments)   Syncope, also   Nsaids Other (See Comments)   Because of an abnormal kidney test result   Penicillin V Hives   Shellfish Allergy Nausea And Vomiting, Other (See Comments)   Pt has shellfish allergy only.  Has had IV contrast x 2 and did fine.   Shellfish-derived Products Nausea And Vomiting        Medication List        Accurate as of May 05, 2022  4:10 PM. If you have any questions, ask your nurse or doctor.          amLODipine 2.5 MG tablet Commonly known as: NORVASC Take 2.5 mg by mouth daily.   apixaban 5 MG Tabs tablet Commonly known as: ELIQUIS Take 1 tablet (5 mg total) by mouth 2 (two) times daily.  atorvastatin 40 MG tablet Commonly known as: LIPITOR Take 1 tablet (40 mg total) by mouth  daily.   B-COMPLEX/FOLIC ACID/VITAMIN C PO Take 1 tablet by mouth daily with breakfast.   bimatoprost 0.03 % ophthalmic solution Commonly known as: LUMIGAN Place 1 drop into both eyes at bedtime.   calcium carbonate 750 MG chewable tablet Commonly known as: TUMS EX Chew 750 mg by mouth daily as needed for heartburn.   cycloSPORINE 0.05 % ophthalmic emulsion Commonly known as: RESTASIS Place 1 drop into both eyes 2 (two) times daily.   diclofenac Sodium 1 % Gel Commonly known as: VOLTAREN Apply 2 g topically in the morning and at bedtime.   docusate sodium 100 MG capsule Commonly known as: COLACE Take 100 mg by mouth daily.   levothyroxine 25 MCG tablet Commonly known as: SYNTHROID Take 25 mcg by mouth daily before breakfast.   melatonin 3 MG Tabs tablet Take 1 tablet (3 mg total) by mouth at bedtime.   miconazole 2 % powder Commonly known as: MICOTIN Apply topically daily. Patient may apply to dry skin under breasts daily and after showering   Myrbetriq 25 MG Tb24 tablet Generic drug: mirabegron ER Take 25 mg by mouth daily.   nystatin powder Commonly known as: MYCOSTATIN/NYSTOP Apply 1 application topically 2 (two) times daily as needed.   pantoprazole 40 MG tablet Commonly known as: PROTONIX Take 40 mg by mouth daily.   sodium chloride 1 g tablet Take 1 g by mouth 2 (two) times daily.   Vitamin D3 50 MCG (2000 UT) Tabs Generic drug: Cholecalciferol Take 2,000 Units by mouth in the morning.   zinc oxide 20 % ointment Apply 1 application topically as needed (for redness- to be applied to buttocks/peri area and after every incontinent episode).        Review of Systems  Constitutional:  Negative for appetite change and fever.  HENT:  Positive for hearing loss. Negative for congestion and trouble swallowing.   Eyes:  Negative for visual disturbance.  Respiratory:  Negative for cough and shortness of breath.   Cardiovascular:  Negative for leg swelling.   Gastrointestinal:  Negative for abdominal pain and constipation.  Genitourinary:  Positive for frequency. Negative for difficulty urinating, dysuria, flank pain and urgency.  Musculoskeletal:  Positive for arthralgias, back pain and gait problem.       R ankle pain is chronic  Skin:  Negative for color change.  Neurological:  Negative for speech difficulty, weakness and light-headedness.       Memory deficit  Psychiatric/Behavioral:  Positive for confusion. Negative for sleep disturbance. The patient is not nervous/anxious.     Immunization History  Administered Date(s) Administered   Influenza Split 11/13/2012, 12/13/2012, 11/30/2013, 10/09/2014, 11/24/2018   Influenza, High Dose Seasonal PF 12/01/2013, 10/04/2014, 10/12/2015, 11/06/2016, 11/12/2017   Influenza,inj,Quad PF,6+ Mos 10/24/2010   Influenza-Unspecified 10/24/2010, 11/01/2019, 12/04/2020   Moderna Covid-19 Vaccine Bivalent Booster 19yrs & up 07/24/2021   Moderna SARS-COV2 Booster Vaccination 07/18/2020   Moderna Sars-Covid-2 Vaccination 02/14/2019, 03/14/2019, 12/26/2019   PFIZER(Purple Top)SARS-COV-2 Vaccination 10/31/2020   PPD Test 11/27/2021   Pfizer Covid-19 Vaccine Bivalent Booster 99yrs & up 10/31/2020   Pneumococcal Conjugate-13 09/28/2013   Pneumococcal Polysaccharide-23 03/05/2012   Td 10/16/2010   Td (Adult) 08/27/2000   Tdap 08/27/2000, 10/16/2010   Zoster, Live 01/11/2013, 03/31/2020   Pertinent  Health Maintenance Due  Topic Date Due   INFLUENZA VACCINE  09/10/2021   DEXA SCAN  Completed  01/22/2022    7:00 AM 02/12/2022    4:30 PM 03/25/2022   11:07 AM 04/03/2022    3:37 PM 04/18/2022    9:19 AM  Fall Risk  Falls in the past year?  0 0 0 0  Was there an injury with Fall?  0 0 0 0  Fall Risk Category Calculator  0 0 0 0  Fall Risk Category (Retired)  Low     (RETIRED) Patient Fall Risk Level High fall risk High fall risk     Patient at Risk for Falls Due to  History of fall(s);Impaired  balance/gait;Impaired mobility;Mental status change History of fall(s);Impaired balance/gait;Impaired mobility;Mental status change History of fall(s);Impaired balance/gait;Impaired mobility;Mental status change History of fall(s);Impaired balance/gait;Impaired mobility;Mental status change  Fall risk Follow up  Falls evaluation completed Falls evaluation completed Falls evaluation completed Falls evaluation completed   Functional Status Survey:    Vitals:   05/05/22 1555  BP: (!) 163/87  Pulse: 76  Resp: 16  Temp: 98 F (36.7 C)  SpO2: 95%   There is no height or weight on file to calculate BMI. Physical Exam Vitals and nursing note reviewed.  Constitutional:      Appearance: Normal appearance.  HENT:     Head: Normocephalic and atraumatic.     Mouth/Throat:     Mouth: Mucous membranes are moist.  Eyes:     Extraocular Movements: Extraocular movements intact.     Conjunctiva/sclera: Conjunctivae normal.     Pupils: Pupils are equal, round, and reactive to light.  Cardiovascular:     Rate and Rhythm: Normal rate. Rhythm irregular.     Heart sounds: No murmur heard. Pulmonary:     Effort: Pulmonary effort is normal.     Breath sounds: No rales.  Abdominal:     General: Bowel sounds are normal.     Palpations: Abdomen is soft.     Tenderness: There is no abdominal tenderness. There is no right CVA tenderness, left CVA tenderness, guarding or rebound.     Hernia: A hernia is present.     Comments: Incision hernia  Musculoskeletal:        General: Tenderness present.     Cervical back: Normal range of motion and neck supple.     Right lower leg: No edema.     Left lower leg: No edema.     Comments: Right ankle brace, chronic pain.  Skin:    General: Skin is warm and dry.  Neurological:     General: No focal deficit present.     Mental Status: She is alert. Mental status is at baseline.     Motor: No weakness.     Gait: Gait abnormal.     Comments: Oriented to person,  time  Psychiatric:        Mood and Affect: Mood normal.        Thought Content: Thought content normal.     Comments: Confused her locations.      Labs reviewed: Recent Labs    11/24/21 1023 11/26/21 0420 01/22/22 0925 04/08/22 0755  NA 135 135 138 139  K 3.9 3.7 4.5 4.3  CL 101 104 104 104  CO2 26 23 25  24*  GLUCOSE 122* 109* 125*  --   BUN 22 29* 21 24*  CREATININE 1.20* 1.23* 1.23* 1.2*  CALCIUM 9.5 8.7* 9.3 9.3   Recent Labs    05/27/21 0000 05/27/21 1244 11/24/21 1023 01/22/22 0925  AST  --  12* 23 26  ALT  --  18 16 16   ALKPHOS  --  63 67 68  BILITOT  --   --  0.6 0.6  PROT  --   --  8.5* 7.1  ALBUMIN 3.4*  --  4.2 3.5   Recent Labs    11/24/21 1023 11/26/21 0420 01/22/22 0925 04/08/22 0755  WBC 8.2 8.9 7.8 7.3  NEUTROABS 6.6  --  6.3 3,833.00  HGB 13.9 12.7 12.6 12.4  HCT 44.4 40.1 40.6 38  MCV 84.6 84.2 84.1  --   PLT 237 202 191 224   Lab Results  Component Value Date   TSH 3.19 03/27/2022   Lab Results  Component Value Date   HGBA1C 5.7 (H) 11/24/2021   Lab Results  Component Value Date   CHOL 181 11/25/2021   HDL 37 (L) 11/25/2021   LDLCALC 127 (H) 11/25/2021   TRIG 85 11/25/2021   CHOLHDL 4.9 11/25/2021    Significant Diagnostic Results in last 30 days:  No results found.  Assessment/Plan: Urinary frequency  increased confusion-05/02/22 in [place] on [place] power [place], redirected back to SNF [facility], 05/03/22 the patient was noted to be in the apartment are, confused about where she was. The patient denied abd pain or dysuria or urinary urgency today.  on Mirabegron UA C/S to r/o UTI  Osteoarthritis chronic R ankle pain, uses ankle brace, ambulates with walker slowly,  w/c to go further  HLD (hyperlipidemia)  on Atorvastatin, LDL 127 11/25/21  Essential hypertension Noted elevated Sbp, takes Hydralazine, will re check.   Slow transit constipation Stable,  takes Colace.   Senile dementia ([place])  SNF [facility] for supportive  care, 01/29/22 MMSE 23/30, more confused than usual, UA C/S to r/o UTI, observe.   Atrial flutter ([place])  f/u Cardiology, off anticoagulation 2/2 falls in the past, restarted Eliquis after acute CVA 11/24/21    Family/ staff Communication: plan of care reviewed with the patient and charge nurse.   Labs/tests ordered:  UA C/S  Time spend 35 minutes.

## 2022-05-05 NOTE — Assessment & Plan Note (Signed)
takes Levothyroxine, TSH 3.19 03/27/22 

## 2022-05-05 NOTE — Assessment & Plan Note (Signed)
on Atorvastatin, LDL 127 11/25/21 

## 2022-05-05 NOTE — Assessment & Plan Note (Signed)
Noted elevated Sbp, takes Hydralazine, will re check.

## 2022-05-05 NOTE — Assessment & Plan Note (Signed)
SNF [facility] for supportive care, 01/29/22 MMSE 23/30, more confused than usual, UA C/S to r/o UTI, observe.

## 2022-05-05 NOTE — Assessment & Plan Note (Signed)
increased confusion-05/02/22 in [place] on [place] power [place], redirected back to SNF [facility], 05/03/22 the patient was noted to be in the apartment are, confused about where she was. The patient denied abd pain or dysuria or urinary urgency today.  on Mirabegron UA C/S to r/o UTI

## 2022-05-05 NOTE — Assessment & Plan Note (Signed)
Stable, takes Colace.  

## 2022-05-05 NOTE — Assessment & Plan Note (Signed)
f/u Cardiology, off anticoagulation 2/2 falls in the past, restarted Eliquis after acute CVA 11/24/21 

## 2022-05-12 ENCOUNTER — Encounter: Payer: Self-pay | Admitting: Nurse Practitioner

## 2022-05-12 ENCOUNTER — Non-Acute Institutional Stay (SKILLED_NURSING_FACILITY): Payer: Medicare Other | Admitting: Nurse Practitioner

## 2022-05-12 DIAGNOSIS — I10 Essential (primary) hypertension: Secondary | ICD-10-CM

## 2022-05-12 DIAGNOSIS — N1831 Chronic kidney disease, stage 3a: Secondary | ICD-10-CM | POA: Diagnosis not present

## 2022-05-12 DIAGNOSIS — F0390 Unspecified dementia without behavioral disturbance: Secondary | ICD-10-CM

## 2022-05-12 DIAGNOSIS — M159 Polyosteoarthritis, unspecified: Secondary | ICD-10-CM

## 2022-05-12 DIAGNOSIS — L299 Pruritus, unspecified: Secondary | ICD-10-CM

## 2022-05-12 DIAGNOSIS — I639 Cerebral infarction, unspecified: Secondary | ICD-10-CM | POA: Diagnosis not present

## 2022-05-12 DIAGNOSIS — R350 Frequency of micturition: Secondary | ICD-10-CM

## 2022-05-12 DIAGNOSIS — E039 Hypothyroidism, unspecified: Secondary | ICD-10-CM | POA: Diagnosis not present

## 2022-05-12 DIAGNOSIS — I4892 Unspecified atrial flutter: Secondary | ICD-10-CM

## 2022-05-12 NOTE — Assessment & Plan Note (Signed)
on Mirabegron 

## 2022-05-12 NOTE — Assessment & Plan Note (Signed)
SNF [facility] for supportive care, 01/29/22 MMSE 23/30 

## 2022-05-12 NOTE — Assessment & Plan Note (Signed)
takes Levothyroxine, TSH 3.19 03/27/22 

## 2022-05-12 NOTE — Assessment & Plan Note (Signed)
Blood pressure is controlled,  takes Hydralazine  

## 2022-05-12 NOTE — Progress Notes (Unsigned)
Location:   SNF [place] Room Number: 43-A Place of Service:  SNF (31) Provider: Lennie Odor Sona Nations NP  Kemyra August X, NP  Patient Care Team: Blannie Shedlock X, NP as PCP - General (Internal Medicine) Donato Heinz, MD as PCP - Cardiology (Cardiology) Brien Few, MD as Consulting Physician (Obstetrics and Gynecology) Marti Sleigh, MD as Consulting Physician (Gynecology) Nancy Marus, MD as Consulting Physician (Gynecologic Oncology)  Extended Emergency Contact Information Primary Emergency Contact: [facility] Address: [address]          [place], [place] [address] [name] of [place] Phone: [phone] Mobile Phone: [phone] Relation: Daughter  Code Status: FULL CODE Goals of care: Advanced Directive information    05/12/2022    4:16 PM  Advanced Directives  Does Patient Have a Medical Advance Directive? Yes  Type of Paramedic of [place];Living will  Does patient want to make changes to medical advance directive? No - Patient declined  Copy of [place] in Chart? Yes - validated most recent copy scanned in chart (See row information)     Chief Complaint  Patient presents with   Acute Visit    Upper back rash    HPI:  Pt is a 89 y.o. female seen today for an acute visit for scratched area spots upper back and neck, no apparent rash, uncertain of onset and duration.      Baseline confusion, urine culture negative UTI, asymptomatic as well.              11/24/21 acute CVA left PCA embolic strokes, no apparent residual symptoms, on Eliquis f/u Neurology             CKD, Bun/creat 24/1.2 04/08/22             Hypothyroidism, takes Levothyroxine, TSH 3.19 03/27/22             Hyponatremia, Na 139 04/08/22             Syncope: Orthostasis ?, no further recurrence since admitted to SNF [facility]             A flutter, f/u Cardiology, off anticoagulation 2/2 falls in the past, restarted Eliquis after  acute CVA 11/24/21             Hx of ovarian CA s/p TAH/BSO 03/2011, in remission.             Senile dementia,  SNF [facility] for supportive care, 01/29/22 MMSE 23/30             Constipation takes Colace.              HTN, takes Hydralazine              OP, takes Ca, Vit D             Insomnia, off Ambien.              Hyperlipidemia, on Atorvastatin, LDL 127 11/25/21             OA, chronic R ankle pain, uses ankle brace,  ambulates with walker slowly,  w/c to go further             Urinary frequency, on Mirabegron  Past Medical History:  Diagnosis Date   Anemia associated with acute blood loss 04/03/2011   Arthritis    knees    Ascites    Blood transfusion    hx of [id number]    Complication  of anesthesia    Sodium drops per pt    Cystitis    DIARRHEA, ANTIBIOTIC ASSOCIATED 05/31/2009   Qualifier: Diagnosis of  By: Tommy Medal MD, Roderic Scarce     GERD (gastroesophageal reflux disease)    H/O hiatal hernia    Hyperlipidemia    Hypertension    Hyponatremia    Neutropenia with fever 05/18/2011   OSTEOMYELITIS, CHRONIC, LOWER LEG 04/23/2009   Qualifier: Diagnosis of  By: Johnnye Sima MD, Felecia Shelling    OSTEOPOROSIS 04/23/2009   Qualifier: Diagnosis of  By: Johnnye Sima MD, Dellis Filbert     Ovarian cancer 01/25/2011   Pneumonia    hx of    Postmenopausal atrophic vaginitis    PROSTHETIC JOINT COMPLICATION [id number]   Qualifier: Diagnosis of  By: Tommy Medal MD, Cornelius     Renal disorder    Decreased kidney function   Staph infection 2010   after knee replacement   Urinary frequency    Vulvitis    Past Surgical History:  Procedure Laterality Date   ABDOMINAL HYSTERECTOMY  04/01/2011   Procedure: HYSTERECTOMY ABDOMINAL;  Surgeon: Alvino Chapel, MD;  Location: [facility];  Service: Gynecology;  Laterality: N/A;   APPENDECTOMY     JOINT REPLACEMENT     R knee in 2008, 5 operations on L knee   LAPAROTOMY  04/01/2011   Procedure: EXPLORATORY LAPAROTOMY;  Surgeon: Alvino Chapel, MD;   Location: [facility];  Service: Gynecology;  Laterality: N/A;   OTHER SURGICAL HISTORY     hx of C section 1966   SALPINGOOPHORECTOMY  04/01/2011   Procedure: SALPINGO OOPHERECTOMY;  Surgeon: Alvino Chapel, MD;  Location: [facility];  Service: Gynecology;  Laterality: Bilateral;   TONSILLECTOMY      Allergies  Allergen Reactions   Morphine And Related Anaphylaxis and Other (See Comments)    Given after knee replacement and code blue occurred   Morphine Sulfate Anaphylaxis   Penicillins Hives, Itching and Other (See Comments)    Syncope, also   Nsaids Other (See Comments)    Because of an abnormal kidney test result   Penicillin V Hives   Shellfish Allergy Nausea And Vomiting and Other (See Comments)    Pt has shellfish allergy only.  Has had IV contrast x 2 and did fine.   Shellfish-Derived Products Nausea And Vomiting    Allergies as of 05/12/2022       Reactions   Morphine And Related Anaphylaxis, Other (See Comments)   Given after knee replacement and code blue occurred   Morphine Sulfate Anaphylaxis   Penicillins Hives, Itching, Other (See Comments)   Syncope, also   Nsaids Other (See Comments)   Because of an abnormal kidney test result   Penicillin V Hives   Shellfish Allergy Nausea And Vomiting, Other (See Comments)   Pt has shellfish allergy only.  Has had IV contrast x 2 and did fine.   Shellfish-derived Products Nausea And Vomiting        Medication List        Accurate as of May 12, 2022 11:59 PM. If you have any questions, ask your nurse or doctor.          STOP taking these medications    calcium carbonate 750 MG chewable tablet Commonly known as: TUMS EX Stopped by: Hayle Parisi X Giavanna Kang, NP   cycloSPORINE 0.05 % ophthalmic emulsion Commonly known as: RESTASIS Stopped by: Kaeleb Emond X Jonny Longino, NP   zinc oxide 20 % ointment  Stopped by: Sivan Quast X Chrishana Spargur, NP       TAKE these medications    amLODipine 2.5 MG tablet Commonly known as: NORVASC Take 2.5 mg by mouth  at bedtime.   apixaban 5 MG Tabs tablet Commonly known as: ELIQUIS Take 1 tablet (5 mg total) by mouth 2 (two) times daily.   atorvastatin 40 MG tablet Commonly known as: LIPITOR Take 1 tablet (40 mg total) by mouth daily.   B-COMPLEX/FOLIC ACID/VITAMIN C PO Take 1 tablet by mouth daily with breakfast.   bimatoprost 0.03 % ophthalmic solution Commonly known as: LUMIGAN Place 1 drop into both eyes at bedtime.   cloNIDine 0.1 MG tablet Commonly known as: CATAPRES Take 0.1 mg by mouth daily.   diclofenac Sodium 1 % Gel Commonly known as: VOLTAREN Apply 2 g topically in the morning and at bedtime.   docusate sodium 100 MG capsule Commonly known as: COLACE Take 100 mg by mouth daily.   hydrocortisone cream 1 % Apply 1 Application topically 2 (two) times daily.   levothyroxine 25 MCG tablet Commonly known as: SYNTHROID Take 25 mcg by mouth daily before breakfast.   loratadine 10 MG tablet Commonly known as: CLARITIN Take 10 mg by mouth daily.   melatonin 3 MG Tabs tablet Take 1 tablet (3 mg total) by mouth at bedtime.   miconazole 2 % powder Commonly known as: MICOTIN Apply topically daily. Patient may apply to dry skin under breasts daily and after showering   Myrbetriq 25 MG Tb24 tablet Generic drug: mirabegron ER Take 25 mg by mouth daily.   nystatin powder Commonly known as: MYCOSTATIN/NYSTOP Apply 1 application topically 2 (two) times daily as needed.   pantoprazole 40 MG tablet Commonly known as: PROTONIX Take 40 mg by mouth daily.   polyvinyl alcohol 1.4 % ophthalmic solution Commonly known as: LIQUIFILM TEARS Place 1 drop into both eyes in the morning, at noon, in the evening, and at bedtime.   sodium chloride 1 g tablet Take 1 g by mouth 2 (two) times daily.   Vitamin D3 50 MCG (2000 UT) Tabs Generic drug: Cholecalciferol Take 2,000 Units by mouth in the morning.        Review of Systems  Constitutional:  Negative for appetite change and  fever.  HENT:  Positive for hearing loss. Negative for congestion and trouble swallowing.   Eyes:  Negative for visual disturbance.  Respiratory:  Negative for cough and shortness of breath.   Cardiovascular:  Negative for leg swelling.  Gastrointestinal:  Negative for abdominal pain and constipation.  Genitourinary:  Positive for frequency. Negative for difficulty urinating, dysuria, flank pain and urgency.  Musculoskeletal:  Positive for arthralgias, back pain and gait problem.       R ankle pain is chronic  Skin:  Negative for color change.  Neurological:  Negative for speech difficulty, weakness and light-headedness.       Memory deficit  Psychiatric/Behavioral:  Positive for confusion. Negative for sleep disturbance. The patient is not nervous/anxious.     Immunization History  Administered Date(s) Administered   Influenza Split 11/13/2012, 12/13/2012, 11/30/2013, 10/09/2014, 11/24/2018   Influenza, High Dose Seasonal PF 12/01/2013, 10/04/2014, 10/12/2015, 11/06/2016, 11/12/2017   Influenza,inj,Quad PF,6+ Mos 10/24/2010   Influenza-Unspecified 10/24/2010, 11/01/2019, 12/04/2020   Moderna Covid-19 Vaccine Bivalent Booster 56yrs & up 07/24/2021   Moderna SARS-COV2 Booster Vaccination 07/18/2020   Moderna Sars-Covid-2 Vaccination 02/14/2019, 03/14/2019, 12/26/2019   PFIZER(Purple Top)SARS-COV-2 Vaccination 10/31/2020   PPD Test 11/27/2021   Pfizer Covid-19 Vaccine  Bivalent Booster 82yrs & up 10/31/2020   Pneumococcal Conjugate-13 09/28/2013   Pneumococcal Polysaccharide-23 03/05/2012   Td 10/16/2010   Td (Adult) 08/27/2000   Tdap 08/27/2000, 10/16/2010   Zoster, Live 01/11/2013, 03/31/2020   Pertinent  Health Maintenance Due  Topic Date Due   INFLUENZA VACCINE  09/11/2022   DEXA SCAN  Completed      01/22/2022    7:00 AM 02/12/2022    4:30 PM 03/25/2022   11:07 AM 04/03/2022    3:37 PM 04/18/2022    9:19 AM  [place] in the past year?  0 0 0 0  Was there an injury  with Fall?  0 0 0 0  Fall Risk Category Calculator  0 0 0 0  Fall Risk Category (Retired)  Low     (RETIRED) Patient Fall Risk Level High fall risk High fall risk     Patient at Risk for Falls Due to  History of fall(s);Impaired balance/gait;Impaired mobility;Mental status change History of fall(s);Impaired balance/gait;Impaired mobility;Mental status change History of fall(s);Impaired balance/gait;Impaired mobility;Mental status change History of fall(s);Impaired balance/gait;Impaired mobility;Mental status change  Fall risk Follow up  Falls evaluation completed Falls evaluation completed Falls evaluation completed Falls evaluation completed   Functional Status Survey:    Vitals:   05/12/22 1609  BP: (!) 159/76  Pulse: 87  Resp: 16  Temp: 98 F (36.7 C)  SpO2: 95%  Weight: 189 lb 6.4 oz (85.9 kg)  Height: 5\' 3"  (1.6 m)   Body mass index is 33.55 kg/m. Physical Exam Vitals and nursing note reviewed.  Constitutional:      Appearance: Normal appearance.  HENT:     Head: Normocephalic and atraumatic.     Mouth/Throat:     Mouth: Mucous membranes are moist.  Eyes:     Extraocular Movements: Extraocular movements intact.     Conjunctiva/sclera: Conjunctivae normal.     Pupils: Pupils are equal, round, and reactive to light.  Cardiovascular:     Rate and Rhythm: Normal rate. Rhythm irregular.     Heart sounds: No murmur heard. Pulmonary:     Effort: Pulmonary effort is normal.     Breath sounds: No rales.  Abdominal:     General: Bowel sounds are normal.     Palpations: Abdomen is soft.     Tenderness: There is no abdominal tenderness. There is no right CVA tenderness, left CVA tenderness, guarding or rebound.     Hernia: A hernia is present.     Comments: Incision hernia  Musculoskeletal:        General: Tenderness present.     Cervical back: Normal range of motion and neck supple.     Right lower leg: No edema.     Left lower leg: No edema.     Comments: Right ankle  brace, chronic pain.  Skin:    General: Skin is warm and dry.     Comments: Scattered scratched spots upper back, neck, no apparent rash, the patient stated she scratched because of itching.   Neurological:     General: No focal deficit present.     Mental Status: She is alert. Mental status is at baseline.     Motor: No weakness.     Gait: Gait abnormal.     Comments: Oriented to person, time  Psychiatric:        Mood and Affect: Mood normal.        Thought Content: Thought content normal.     Comments:  Confused her locations.      Labs reviewed: Recent Labs    11/24/21 1023 11/26/21 0420 01/22/22 0925 04/08/22 0755  NA 135 135 138 139  K 3.9 3.7 4.5 4.3  CL 101 104 104 104  CO2 26 23 25  24*  GLUCOSE 122* 109* 125*  --   BUN 22 29* 21 24*  CREATININE 1.20* 1.23* 1.23* 1.2*  CALCIUM 9.5 8.7* 9.3 9.3   Recent Labs    05/27/21 0000 05/27/21 1244 11/24/21 1023 01/22/22 0925  AST  --  12* 23 26  ALT  --  18 16 16   ALKPHOS  --  63 67 68  BILITOT  --   --  0.6 0.6  PROT  --   --  8.5* 7.1  ALBUMIN 3.4*  --  4.2 3.5   Recent Labs    11/24/21 1023 11/26/21 0420 01/22/22 0925 04/08/22 0755  WBC 8.2 8.9 7.8 7.3  NEUTROABS 6.6  --  6.3 3,833.00  HGB 13.9 12.7 12.6 12.4  HCT 44.4 40.1 40.6 38  MCV 84.6 84.2 84.1  --   PLT 237 202 191 224   Lab Results  Component Value Date   TSH 3.19 03/27/2022   Lab Results  Component Value Date   HGBA1C 5.7 (H) 11/24/2021   Lab Results  Component Value Date   CHOL 181 11/25/2021   HDL 37 (L) 11/25/2021   LDLCALC 127 (H) 11/25/2021   TRIG 85 11/25/2021   CHOLHDL 4.9 11/25/2021    Significant Diagnostic Results in last 30 days:  No results found.  Assessment/Plan: Itching scratched area spots upper back and neck, no apparent rash, uncertain of onset and duration. Will apply 1% Hydrocortisone cream bid to affected area x 10days, Claritin 10mg  qd x 5 days, observe.   Stroke (cerebrum) ([place]) 11/24/21 acute CVA left  PCA embolic strokes, no apparent residual symptoms, on Eliquis f/u Neurology  CKD (chronic kidney disease) stage 3, GFR 30-59 ml/min (HCC) Bun/creat 24/1.2 04/08/22  Hypothyroidism  takes Levothyroxine, TSH 3.19 03/27/22  Atrial flutter ([place])  f/u Cardiology, off anticoagulation 2/2 falls in the past, restarted Eliquis after acute CVA 11/24/21  Senile dementia ([place])  SNF [facility] for supportive care, 01/29/22 MMSE 23/30  Essential hypertension Blood pressure is controlled, takes Hydralazine   Osteoarthritis  chronic R ankle pain, uses ankle brace,  ambulates with walker slowly,  w/c to go further  Urinary frequency on Mirabegron    Family/ staff Communication: plan of care reviewed with the patient and charge nurse.   Labs/tests ordered:  none  Time spend 35 minutes.

## 2022-05-12 NOTE — Assessment & Plan Note (Signed)
Bun/creat 24/1.2 04/08/22 

## 2022-05-12 NOTE — Assessment & Plan Note (Signed)
chronic R ankle pain, uses ankle brace, ambulates with walker slowly,  w/c to go further 

## 2022-05-12 NOTE — Assessment & Plan Note (Signed)
scratched area spots upper back and neck, no apparent rash, uncertain of onset and duration. Will apply 1% Hydrocortisone cream bid to affected area x 10days, Claritin 10mg  qd x 5 days, observe.

## 2022-05-12 NOTE — Assessment & Plan Note (Signed)
f/u Cardiology, off anticoagulation 2/2 falls in the past, restarted Eliquis after acute CVA 11/24/21 

## 2022-05-12 NOTE — Assessment & Plan Note (Signed)
11/24/21 acute CVA left PCA embolic strokes, no apparent residual symptoms, on Eliquis f/u Neurology 

## 2022-05-13 ENCOUNTER — Encounter: Payer: Self-pay | Admitting: Nurse Practitioner

## 2022-05-13 ENCOUNTER — Non-Acute Institutional Stay (SKILLED_NURSING_FACILITY): Payer: Medicare Other | Admitting: Family Medicine

## 2022-05-13 DIAGNOSIS — M810 Age-related osteoporosis without current pathological fracture: Secondary | ICD-10-CM

## 2022-05-13 DIAGNOSIS — N1831 Chronic kidney disease, stage 3a: Secondary | ICD-10-CM | POA: Diagnosis not present

## 2022-05-13 DIAGNOSIS — I4892 Unspecified atrial flutter: Secondary | ICD-10-CM | POA: Diagnosis not present

## 2022-05-13 DIAGNOSIS — R4189 Other symptoms and signs involving cognitive functions and awareness: Secondary | ICD-10-CM | POA: Diagnosis not present

## 2022-05-13 DIAGNOSIS — I10 Essential (primary) hypertension: Secondary | ICD-10-CM | POA: Diagnosis not present

## 2022-05-13 NOTE — Progress Notes (Signed)
Provider:  Alain Honey, MD Location:      Place of Service:     PCP: Mast, Man X, NP Patient Care Team: Mast, Man X, NP as PCP - General (Internal Medicine) Donato Heinz, MD as PCP - Cardiology (Cardiology) Brien Few, MD as Consulting Physician (Obstetrics and Gynecology) Marti Sleigh, MD as Consulting Physician (Gynecology) Nancy Marus, MD as Consulting Physician (Gynecologic Oncology)  Extended Emergency Contact Information Primary Emergency Contact: [facility] Address: [address]          [place], [place] [address] [name] of [place] Phone: [phone] Mobile Phone: [phone] Relation: Daughter  Code Status:  Goals of Care: Advanced Directive information    05/12/2022    4:16 PM  Advanced Directives  Does Patient Have a Medical Advance Directive? Yes  Type of Paramedic of [place];Living will  Does patient want to make changes to medical advance directive? No - Patient declined  Copy of [place] in Chart? Yes - validated most recent copy scanned in chart (See row information)      No chief complaint on file.   HPI: Patient is a 89 y.o. female seen today for medical management of chronic problems including hypertension, hyperlipidemia, osteoarthritis, senile dementia, atrial flutter, is post CVA in October 2023.  Recently doing well, she moves about the facility and her electric scooter.  There have been recent instances of her going outside the door.  That has been remedied by shedding some of the doors that separate her hall from other halls. Family was here last week to take her lunch.  Appetite is good.  Her only complaint today is some itching on her upper back that was seen by nurse practitioner yesterday.  1% hydrocortisone was ordered. I spoke with her at some length today and we reminisced about some of the pictures decorating the walls of her room.  I have known her  for some time as she is a member of our church.  She lamented not being able to attend church like she used to.  Past Medical History:  Diagnosis Date   Anemia associated with acute blood loss 04/03/2011   Arthritis    knees    Ascites    Blood transfusion    hx of [id number]    Complication of anesthesia    Sodium drops per pt    Cystitis    DIARRHEA, ANTIBIOTIC ASSOCIATED 05/31/2009   Qualifier: Diagnosis of  By: Tommy Medal MD, Cornelius     GERD (gastroesophageal reflux disease)    H/O hiatal hernia    Hyperlipidemia    Hypertension    Hyponatremia    Neutropenia with fever 05/18/2011   OSTEOMYELITIS, CHRONIC, LOWER LEG 04/23/2009   Qualifier: Diagnosis of  By: Johnnye Sima MD, Felecia Shelling    OSTEOPOROSIS 04/23/2009   Qualifier: Diagnosis of  By: Johnnye Sima MD, Dellis Filbert     Ovarian cancer 01/25/2011   Pneumonia    hx of    Postmenopausal atrophic vaginitis    PROSTHETIC JOINT COMPLICATION [id number]   Qualifier: Diagnosis of  By: Tommy Medal MD, Cornelius     Renal disorder    Decreased kidney function   Staph infection 2010   after knee replacement   Urinary frequency    Vulvitis    Past Surgical History:  Procedure Laterality Date   ABDOMINAL HYSTERECTOMY  04/01/2011   Procedure: HYSTERECTOMY ABDOMINAL;  Surgeon: Alvino Chapel, MD;  Location: [facility];  Service: Gynecology;  Laterality: N/A;   APPENDECTOMY     JOINT REPLACEMENT     R knee in 2008, 5 operations on L knee   LAPAROTOMY  04/01/2011   Procedure: EXPLORATORY LAPAROTOMY;  Surgeon: Alvino Chapel, MD;  Location: [facility];  Service: Gynecology;  Laterality: N/A;   OTHER SURGICAL HISTORY     hx of C section 1966   SALPINGOOPHORECTOMY  04/01/2011   Procedure: SALPINGO OOPHERECTOMY;  Surgeon: Alvino Chapel, MD;  Location: [facility];  Service: Gynecology;  Laterality: Bilateral;   TONSILLECTOMY      reports that she quit smoking about 70 years ago. Her smoking use included cigarettes. She has never used  smokeless tobacco. She reports that she does not currently use alcohol. She reports that she does not use drugs. Social History   Socioeconomic History   Marital status: Widowed    Spouse name: Not on file   Number of children: Not on file   Years of education: Not on file   Highest education level: Not on file  Occupational History   Not on file  Tobacco Use   Smoking status: Former    Types: Cigarettes    Quit date: 02/11/1952    Years since quitting: 70.2   Smokeless tobacco: Never  Vaping Use   Vaping Use: Never used  Substance and Sexual Activity   Alcohol use: Not Currently   Drug use: Never   Sexual activity: Never  Other Topics Concern   Not on file  Social History Narrative   Not on file   Social Determinants of Health   Financial Resource Strain: Low Risk  (08/23/2021)   Overall Financial Resource Strain (CARDIA)    Difficulty of Paying Living Expenses: Not hard at all  Food Insecurity: No Food Insecurity (11/26/2021)   Hunger Vital Sign    Worried About Running Out of Food in the Last Year: Never true    [place] in the Last Year: Never true  Transportation Needs: No Transportation Needs (11/26/2021)   PRAPARE - Hydrologist (Medical): No    Lack of Transportation (Non-Medical): No  Physical Activity: Insufficiently Active (08/23/2021)   Exercise Vital Sign    Days of Exercise per Week: 7 days    Minutes of Exercise per Session: 10 min  Stress: No Stress Concern Present (08/16/2020)   [place]    Feeling of Stress : Only a little  Social Connections: Socially Isolated (08/23/2021)   Social Connection and Isolation Panel [NHANES]    Frequency of Communication with Friends and Family: More than three times a week    Frequency of Social Gatherings with Friends and Family: Three times a week    Attends Religious Services: Never    Active Member of Clubs or  Organizations: No    Attends Archivist Meetings: Never    Marital Status: Widowed  Intimate Partner Violence: Not At Risk (11/26/2021)   Humiliation, Afraid, Rape, and Kick questionnaire    Fear of Current or Ex-Partner: No    Emotionally Abused: No    Physically Abused: No    Sexually Abused: No    Functional Status Survey:    Family History  Problem Relation Age of Onset   Cancer Other        Bladder cancer   Heart attack Brother    Diabetes Brother     Health Maintenance  Topic Date  Due   DTaP/Tdap/Td (5 - Td or Tdap) 10/15/2020   COVID-19 Vaccine (6 - 2023-24 season) 10/11/2021   Medicare Annual Wellness (AWV)  08/24/2022   INFLUENZA VACCINE  09/11/2022   Pneumonia Vaccine 75+ Years old  Completed   DEXA SCAN  Completed   HPV VACCINES  Aged Out   Zoster Vaccines- Shingrix  Discontinued    Allergies  Allergen Reactions   Morphine And Related Anaphylaxis and Other (See Comments)    Given after knee replacement and code blue occurred   Morphine Sulfate Anaphylaxis   Penicillins Hives, Itching and Other (See Comments)    Syncope, also   Nsaids Other (See Comments)    Because of an abnormal kidney test result   Penicillin V Hives   Shellfish Allergy Nausea And Vomiting and Other (See Comments)    Pt has shellfish allergy only.  Has had IV contrast x 2 and did fine.   Shellfish-Derived Products Nausea And Vomiting    Outpatient Encounter Medications as of 05/13/2022  Medication Sig   amLODipine (NORVASC) 2.5 MG tablet Take 2.5 mg by mouth at bedtime.   apixaban (ELIQUIS) 5 MG TABS tablet Take 1 tablet (5 mg total) by mouth 2 (two) times daily.   atorvastatin (LIPITOR) 40 MG tablet Take 1 tablet (40 mg total) by mouth daily.   B Complex-C-Folic Acid (B-COMPLEX/FOLIC ACID/VITAMIN C PO) Take 1 tablet by mouth daily with breakfast.   bimatoprost (LUMIGAN) 0.03 % ophthalmic solution Place 1 drop into both eyes at bedtime.   Cholecalciferol (VITAMIN D3) 50 MCG  (2000 UT) TABS Take 2,000 Units by mouth in the morning.   cloNIDine (CATAPRES) 0.1 MG tablet Take 0.1 mg by mouth daily.   diclofenac Sodium (VOLTAREN) 1 % GEL Apply 2 g topically in the morning and at bedtime.   docusate sodium (COLACE) 100 MG capsule Take 100 mg by mouth daily.   hydrocortisone cream 1 % Apply 1 Application topically 2 (two) times daily.   levothyroxine (SYNTHROID) 25 MCG tablet Take 25 mcg by mouth daily before breakfast.   loratadine (CLARITIN) 10 MG tablet Take 10 mg by mouth daily.   melatonin 3 MG TABS tablet Take 1 tablet (3 mg total) by mouth at bedtime.   miconazole (MICOTIN) 2 % powder Apply topically daily. Patient may apply to dry skin under breasts daily and after showering   mirabegron ER (MYRBETRIQ) 25 MG TB24 tablet Take 25 mg by mouth daily.   nystatin (MYCOSTATIN/NYSTOP) powder Apply 1 application topically 2 (two) times daily as needed.   pantoprazole (PROTONIX) 40 MG tablet Take 40 mg by mouth daily.   polyvinyl alcohol (LIQUIFILM TEARS) 1.4 % ophthalmic solution Place 1 drop into both eyes in the morning, at noon, in the evening, and at bedtime.   sodium chloride 1 g tablet Take 1 g by mouth 2 (two) times daily.   No facility-administered encounter medications on file as of 05/13/2022.    Review of Systems  Constitutional: Negative.   HENT: Negative.    Eyes: Negative.   Respiratory: Negative.    Cardiovascular: Negative.   Genitourinary:  Positive for frequency.  Neurological: Negative.   Psychiatric/Behavioral:  Positive for confusion.   All other systems reviewed and are negative.   There were no vitals filed for this visit. There is no height or weight on file to calculate BMI. Physical Exam  Labs reviewed: Basic Metabolic Panel: Recent Labs    11/24/21 1023 11/26/21 0420 01/22/22 0925 04/08/22 0755  NA  135 135 138 139  K 3.9 3.7 4.5 4.3  CL 101 104 104 104  CO2 26 23 25  24*  GLUCOSE 122* 109* 125*  --   BUN 22 29* 21 24*   CREATININE 1.20* 1.23* 1.23* 1.2*  CALCIUM 9.5 8.7* 9.3 9.3   Liver Function Tests: Recent Labs    05/27/21 0000 05/27/21 1244 11/24/21 1023 01/22/22 0925  AST  --  12* 23 26  ALT  --  18 16 16   ALKPHOS  --  63 67 68  BILITOT  --   --  0.6 0.6  PROT  --   --  8.5* 7.1  ALBUMIN 3.4*  --  4.2 3.5   No results for input(s): "LIPASE", "AMYLASE" in the last 8760 hours. No results for input(s): "AMMONIA" in the last 8760 hours. CBC: Recent Labs    11/24/21 1023 11/26/21 0420 01/22/22 0925 04/08/22 0755  WBC 8.2 8.9 7.8 7.3  NEUTROABS 6.6  --  6.3 3,833.00  HGB 13.9 12.7 12.6 12.4  HCT 44.4 40.1 40.6 38  MCV 84.6 84.2 84.1  --   PLT 237 202 191 224   Cardiac Enzymes: No results for input(s): "CKTOTAL", "CKMB", "CKMBINDEX", "TROPONINI" in the last 8760 hours. BNP: Invalid input(s): "POCBNP" Lab Results  Component Value Date   HGBA1C 5.7 (H) 11/24/2021   Lab Results  Component Value Date   TSH 3.19 03/27/2022   No results found for: "VITAMINB12" No results found for: "FOLATE" Lab Results  Component Value Date   IRON 74 05/27/2011   TIBC 299 05/27/2011   FERRITIN 498 (H) 05/27/2011    Imaging and Procedures obtained prior to SNF admission: MR BRAIN WO CONTRAST  Result Date: 01/22/2022 CLINICAL DATA:  Acute neuro deficit.  Rule out stroke EXAM: MRI HEAD WITHOUT CONTRAST TECHNIQUE: Multiplanar, multiecho pulse sequences of the brain and surrounding structures were obtained without intravenous contrast. COMPARISON:  CT head 01/22/2022.  MRI head 11/24/2021 FINDINGS: Brain: Negative for acute infarct Moderate white matter changes with confluent and diffuse T2 and FLAIR hyperintensity throughout the white matter. Chronic lacunar infarcts are present in the basal ganglia bilaterally. Mild chronic ischemic change in the pons. Chronic small infarct right cerebellum. Small chronic cortical infarct in the left posterior parietal lobe with laminar necrosis. This area shows  restricted diffusion on the MRI of 11/24/2021. Ventricle size normal. Generalized atrophy. Vascular: Normal arterial flow voids. Skull and upper cervical spine: No focal abnormality. Sinuses/Orbits: Mild mucosal edema paranasal sinuses. Left cataract extraction. No orbital mass Other: None IMPRESSION: 1. Negative for acute infarct. 2. Moderate atrophy.  Moderate chronic ischemic changes. Electronically Signed   By: Franchot Gallo M.D.   On: 01/22/2022 11:55   CT Head Wo Contrast  Result Date: 01/22/2022 CLINICAL DATA:  Delirium.  Altered mental status EXAM: CT HEAD WITHOUT CONTRAST TECHNIQUE: Contiguous axial images were obtained from the base of the skull through the vertex without intravenous contrast. RADIATION DOSE REDUCTION: This exam was performed according to the departmental dose-optimization program which includes automated exposure control, adjustment of the mA and/or kV according to patient size and/or use of iterative reconstruction technique. COMPARISON:  None Available. FINDINGS: Brain: No acute intracranial hemorrhage. No focal mass lesion. No CT evidence of acute infarction. No midline shift or mass effect. No hydrocephalus. Basilar cisterns are patent. There are periventricular and subcortical white matter hypodensities. Generalized cortical atrophy. Vascular: No hyperdense vessel or unexpected calcification. Skull: Normal. Negative for fracture or focal lesion. Sinuses/Orbits: Paranasal sinuses and  mastoid air cells are clear. Orbits are clear. Other: None. IMPRESSION: 1. No acute intracranial findings. 2. Atrophy and white matter microvascular disease Electronically Signed   By: Suzy Bouchard M.D.   On: 01/22/2022 07:55   DG Chest Portable 1 View  Result Date: 01/22/2022 CLINICAL DATA:  Altered mental status. EXAM: PORTABLE CHEST 1 VIEW COMPARISON:  Portable chest 04/18/2020 FINDINGS: There is patchy opacity in the left lower lung field concerning for consolidated pneumonia, possibly  of the lingula as there is silhouetting of the lower left heart border. The remainder of the lungs are clear. The heart is slightly enlarged and no vascular congestion is seen. There is moderate calcification of the aorta with stable mediastinal configuration. There is osteopenia, thoracic spondylosis and slight dextroscoliosis. No appreciable pleural effusion. IMPRESSION: Patchy opacity in the left lower lung field concerning for consolidated pneumonia. Clinical correlation and radiographic follow-up recommended. Electronically Signed   By: Telford Nab M.D.   On: 01/22/2022 07:07    Assessment/Plan 1. Atrial flutter, unspecified type Continues to take Eliquis with no recent falls  2. Stage 3a chronic kidney disease Creatinine is stable around 1.2  3. Cognitive impairment So stable no recent changes that I can detect with her mentation except for the wandering episode last month  4. Essential hypertension Takes hydralazine for blood pressure.  Upper limits of normal  5. Age-related osteoporosis without current pathological fracture Continues with calcium and vitamin D    Family/ staff Communication:   Labs/tests ordered:  Lillette Boxer. Sabra Heck, [place] [address] [place], [place] Office [phone]

## 2022-06-04 ENCOUNTER — Non-Acute Institutional Stay (SKILLED_NURSING_FACILITY): Payer: Medicare Other | Admitting: Nurse Practitioner

## 2022-06-04 ENCOUNTER — Encounter: Payer: Self-pay | Admitting: Nurse Practitioner

## 2022-06-04 DIAGNOSIS — E871 Hypo-osmolality and hyponatremia: Secondary | ICD-10-CM

## 2022-06-04 DIAGNOSIS — I4892 Unspecified atrial flutter: Secondary | ICD-10-CM

## 2022-06-04 DIAGNOSIS — F0390 Unspecified dementia without behavioral disturbance: Secondary | ICD-10-CM

## 2022-06-04 DIAGNOSIS — E039 Hypothyroidism, unspecified: Secondary | ICD-10-CM

## 2022-06-04 DIAGNOSIS — M159 Polyosteoarthritis, unspecified: Secondary | ICD-10-CM

## 2022-06-04 DIAGNOSIS — F5101 Primary insomnia: Secondary | ICD-10-CM

## 2022-06-04 DIAGNOSIS — I639 Cerebral infarction, unspecified: Secondary | ICD-10-CM

## 2022-06-04 DIAGNOSIS — N1831 Chronic kidney disease, stage 3a: Secondary | ICD-10-CM | POA: Diagnosis not present

## 2022-06-04 DIAGNOSIS — I10 Essential (primary) hypertension: Secondary | ICD-10-CM

## 2022-06-04 DIAGNOSIS — M810 Age-related osteoporosis without current pathological fracture: Secondary | ICD-10-CM

## 2022-06-04 DIAGNOSIS — R350 Frequency of micturition: Secondary | ICD-10-CM

## 2022-06-04 DIAGNOSIS — E785 Hyperlipidemia, unspecified: Secondary | ICD-10-CM

## 2022-06-04 NOTE — Assessment & Plan Note (Signed)
on Atorvastatin, LDL 127 11/25/21 

## 2022-06-04 NOTE — Assessment & Plan Note (Signed)
Na 139 04/08/22

## 2022-06-04 NOTE — Assessment & Plan Note (Signed)
SNF [facility] for supportive care, 01/29/22 MMSE 23/30 

## 2022-06-04 NOTE — Assessment & Plan Note (Signed)
11/24/21 acute CVA left PCA embolic strokes, no apparent residual symptoms, on Eliquis f/u Neurology 

## 2022-06-04 NOTE — Assessment & Plan Note (Signed)
chronic R ankle pain, uses ankle brace, ambulates with walker slowly,  w/c to go further 

## 2022-06-04 NOTE — Assessment & Plan Note (Signed)
Stable, off Ambien.  

## 2022-06-04 NOTE — Assessment & Plan Note (Signed)
takes Levothyroxine, TSH 3.19 03/27/22 

## 2022-06-04 NOTE — Assessment & Plan Note (Signed)
takes Ca, Vit D 

## 2022-06-04 NOTE — Progress Notes (Signed)
Location:   SNF [facility] Room Number: 73 Place of Service:  SNF (31) Provider: Arna Snipe Rilyn Scroggs NP  Vernard Gram X, NP  Patient Care Team: Ladona Rosten X, NP as PCP - General (Internal Medicine) [name], MD as PCP - Cardiology (Cardiology) Olivia Mackie, MD as Consulting Physician (Obstetrics and Gynecology) De Blanch, MD as Consulting Physician (Gynecology) Cleda Mccreedy, MD as Consulting Physician (Gynecologic Oncology)  Extended Emergency Contact Information Primary Emergency Contact: [facility] Address: [address]          [place], [place] [address] Darden [place] of [place] Home Phone: [phone] Mobile Phone: [phone] Relation: Daughter  Code Status:  DNR Goals of care: Advanced Directive information    05/12/2022    4:16 PM  Advanced Directives  Does Patient Have a Medical Advance Directive? Yes  Type of Estate agent of [place];Living will  Does patient want to make changes to medical advance directive? No - Patient declined  Copy of Healthcare Power of Attorney in Chart? Yes - validated most recent copy scanned in chart (See row information)     Chief Complaint  Patient presents with   Medical Management of Chronic Issues    HPI:  Pt is a 89 y.o. female seen today for medical management of chronic diseases.                  11/24/21 acute CVA left PCA embolic strokes, no apparent residual symptoms, on Eliquis f/u Neurology             CKD, Bun/creat 24/1.2 04/08/22             Hypothyroidism, takes Levothyroxine, TSH 3.19 03/27/22             Hyponatremia, Na 139 04/08/22             Syncope: Orthostasis ?, no further recurrence since admitted to SNF [facility]             A flutter, f/u Cardiology, off anticoagulation 2/2 falls in the past, restarted Eliquis after acute CVA 11/24/21             Hx of ovarian CA s/p TAH/BSO 03/2011, in remission.             Senile dementia,  SNF [facility] for supportive care,  01/29/22 MMSE 23/30             Constipation takes Colace.              HTN, takes Hydralazine              OP, takes Ca, Vit D             Insomnia, off Ambien.              Hyperlipidemia, on Atorvastatin, LDL 127 11/25/21             OA, chronic R ankle pain, uses ankle brace,  ambulates with walker slowly,  w/c to go further             Urinary frequency, on Mirabegron       Past Medical History:  Diagnosis Date   Anemia associated with acute blood loss 04/03/2011   Arthritis    knees    Ascites    Blood transfusion    hx of 2011    Complication of anesthesia    Sodium drops per pt    Cystitis    DIARRHEA, ANTIBIOTIC ASSOCIATED 05/31/2009  Qualifier: Diagnosis of  By: Daiva Eves MD, Cornelius     GERD (gastroesophageal reflux disease)    H/O hiatal hernia    Hyperlipidemia    Hypertension    Hyponatremia    Neutropenia with fever (HCC) 05/18/2011   OSTEOMYELITIS, CHRONIC, LOWER LEG 04/23/2009   Qualifier: Diagnosis of  By: Ninetta Lights MD, Linus Mako    OSTEOPOROSIS 04/23/2009   Qualifier: Diagnosis of  By: Ninetta Lights MD, Jeffrey     Ovarian cancer (HCC) 01/25/2011   Pneumonia    hx of    Postmenopausal atrophic vaginitis    PROSTHETIC JOINT COMPLICATION 05/28/2009   Qualifier: Diagnosis of  By: Daiva Eves MD, Cornelius     Renal disorder    Decreased kidney function   Staph infection 2010   after knee replacement   Urinary frequency    Vulvitis    Past Surgical History:  Procedure Laterality Date   ABDOMINAL HYSTERECTOMY  04/01/2011   Procedure: HYSTERECTOMY ABDOMINAL;  Surgeon: Jeannette Corpus, MD;  Location: [facility];  Service: Gynecology;  Laterality: N/A;   APPENDECTOMY     JOINT REPLACEMENT     R knee in 2008, 5 operations on L knee   LAPAROTOMY  04/01/2011   Procedure: EXPLORATORY LAPAROTOMY;  Surgeon: Jeannette Corpus, MD;  Location: [facility];  Service: Gynecology;  Laterality: N/A;   OTHER SURGICAL HISTORY     hx of C section 1966    SALPINGOOPHORECTOMY  04/01/2011   Procedure: SALPINGO OOPHERECTOMY;  Surgeon: Jeannette Corpus, MD;  Location: [facility];  Service: Gynecology;  Laterality: Bilateral;   TONSILLECTOMY      Allergies  Allergen Reactions   Morphine And Related Anaphylaxis and Other (See Comments)    Given after knee replacement and code blue occurred   Morphine Sulfate Anaphylaxis   Penicillins Hives, Itching and Other (See Comments)    Syncope, also   Nsaids Other (See Comments)    Because of an abnormal kidney test result   Penicillin V Hives   Shellfish Allergy Nausea And Vomiting and Other (See Comments)    Pt has shellfish allergy only.  Has had IV contrast x 2 and did fine.   Shellfish-Derived Products Nausea And Vomiting    Allergies as of 06/04/2022       Reactions   Morphine And Related Anaphylaxis, Other (See Comments)   Given after knee replacement and code blue occurred   Morphine Sulfate Anaphylaxis   Penicillins Hives, Itching, Other (See Comments)   Syncope, also   Nsaids Other (See Comments)   Because of an abnormal kidney test result   Penicillin V Hives   Shellfish Allergy Nausea And Vomiting, Other (See Comments)   Pt has shellfish allergy only.  Has had IV contrast x 2 and did fine.   Shellfish-derived Products Nausea And Vomiting        Medication List        Accurate as of June 04, 2022 11:59 PM. If you have any questions, ask your nurse or doctor.          amLODipine 2.5 MG tablet Commonly known as: NORVASC Take 2.5 mg by mouth at bedtime.   apixaban 5 MG Tabs tablet Commonly known as: ELIQUIS Take 1 tablet (5 mg total) by mouth 2 (two) times daily.   atorvastatin 40 MG tablet Commonly known as: LIPITOR Take 1 tablet (40 mg total) by mouth daily.   B-COMPLEX/FOLIC ACID/VITAMIN C PO Take 1 tablet by mouth  daily with breakfast.   bimatoprost 0.03 % ophthalmic solution Commonly known as: LUMIGAN Place 1 drop into both eyes at bedtime.   cloNIDine  0.1 MG tablet Commonly known as: CATAPRES Take 0.1 mg by mouth daily.   diclofenac Sodium 1 % Gel Commonly known as: VOLTAREN Apply 2 g topically in the morning and at bedtime.   docusate sodium 100 MG capsule Commonly known as: COLACE Take 100 mg by mouth daily.   hydrocortisone cream 1 % Apply 1 Application topically 2 (two) times daily.   levothyroxine 25 MCG tablet Commonly known as: SYNTHROID Take 25 mcg by mouth daily before breakfast.   loratadine 10 MG tablet Commonly known as: CLARITIN Take 10 mg by mouth daily.   melatonin 3 MG Tabs tablet Take 1 tablet (3 mg total) by mouth at bedtime.   miconazole 2 % powder Commonly known as: MICOTIN Apply topically daily. Patient may apply to dry skin under breasts daily and after showering   Myrbetriq 25 MG Tb24 tablet Generic drug: mirabegron ER Take 25 mg by mouth daily.   nystatin powder Commonly known as: MYCOSTATIN/NYSTOP Apply 1 application topically 2 (two) times daily as needed.   pantoprazole 40 MG tablet Commonly known as: PROTONIX Take 40 mg by mouth daily.   polyvinyl alcohol 1.4 % ophthalmic solution Commonly known as: LIQUIFILM TEARS Place 1 drop into both eyes in the morning, at noon, in the evening, and at bedtime.   sodium chloride 1 g tablet Take 1 g by mouth 2 (two) times daily.   Vitamin D3 50 MCG (2000 UT) Tabs Take 2,000 Units by mouth in the morning.        Review of Systems  Constitutional:  Negative for appetite change and fever.  HENT:  Positive for hearing loss. Negative for congestion and trouble swallowing.   Eyes:  Negative for visual disturbance.  Respiratory:  Negative for shortness of breath.   Cardiovascular:  Negative for leg swelling.  Gastrointestinal:  Negative for abdominal pain and constipation.  Genitourinary:  Positive for frequency. Negative for difficulty urinating, dysuria, flank pain and urgency.  Musculoskeletal:  Positive for arthralgias, back pain and gait  problem.       R ankle pain is chronic  Skin:  Negative for color change.  Neurological:  Negative for speech difficulty, weakness and light-headedness.       Memory deficit  Psychiatric/Behavioral:  Positive for confusion. Negative for sleep disturbance. The patient is not nervous/anxious.     Immunization History  Administered Date(s) Administered   Influenza Split 11/13/2012, 12/13/2012, 11/30/2013, 10/09/2014, 11/24/2018   Influenza, High Dose Seasonal PF 12/01/2013, 10/04/2014, 10/12/2015, 11/06/2016, 11/12/2017   Influenza,inj,Quad PF,6+ Mos 10/24/2010   Influenza-Unspecified 10/24/2010, 11/01/2019, 12/04/2020   Moderna Covid-19 Vaccine Bivalent Booster 81yrs & up 07/24/2021   Moderna SARS-COV2 Booster Vaccination 07/18/2020   Moderna Sars-Covid-2 Vaccination 02/14/2019, 03/14/2019, 12/26/2019   PFIZER(Purple Top)SARS-COV-2 Vaccination 10/31/2020   PPD Test 11/27/2021   Pfizer Covid-19 Vaccine Bivalent Booster 63yrs & up 10/31/2020   Pneumococcal Conjugate-13 09/28/2013   Pneumococcal Polysaccharide-23 03/05/2012   Td 10/16/2010   Td (Adult) 08/27/2000   Tdap 08/27/2000, 10/16/2010   Zoster, Live 01/11/2013, 03/31/2020   Pertinent  Health Maintenance Due  Topic Date Due   INFLUENZA VACCINE  09/11/2022   DEXA SCAN  Completed      01/22/2022    7:00 AM 02/12/2022    4:30 PM 03/25/2022   11:07 AM 04/03/2022    3:37 PM 04/18/2022  9:19 AM  Fall Risk  Falls in the past year?  0 0 0 0  Was there an injury with Fall?  0 0 0 0  Fall Risk Category Calculator  0 0 0 0  Fall Risk Category (Retired)  Low     (RETIRED) Patient Fall Risk Level High fall risk High fall risk     Patient at Risk for Falls Due to  History of fall(s);Impaired balance/gait;Impaired mobility;Mental status change History of fall(s);Impaired balance/gait;Impaired mobility;Mental status change History of fall(s);Impaired balance/gait;Impaired mobility;Mental status change History of fall(s);Impaired  balance/gait;Impaired mobility;Mental status change  Fall risk Follow up  Falls evaluation completed Falls evaluation completed Falls evaluation completed Falls evaluation completed   Functional Status Survey:    Vitals:   06/04/22 1411 06/06/22 1528  BP: (!) 180/87 (!) 156/87  Pulse: 80   Resp: 18   Temp: (!) 96.1 F (35.6 C)   SpO2: 95%   Weight: 189 lb 6.4 oz (85.9 kg)    Body mass index is 33.55 kg/m. Physical Exam Vitals and nursing note reviewed.  Constitutional:      Appearance: Normal appearance.  HENT:     Head: Normocephalic and atraumatic.     Mouth/Throat:     Mouth: Mucous membranes are moist.  Eyes:     Extraocular Movements: Extraocular movements intact.     Conjunctiva/sclera: Conjunctivae normal.     Pupils: Pupils are equal, round, and reactive to light.  Cardiovascular:     Rate and Rhythm: Normal rate. Rhythm irregular.     Heart sounds: No murmur heard. Pulmonary:     Effort: Pulmonary effort is normal.     Breath sounds: No rales.  Abdominal:     General: Bowel sounds are normal.     Palpations: Abdomen is soft.     Tenderness: There is no abdominal tenderness.     Hernia: A hernia is present.     Comments: Incision hernia  Musculoskeletal:        General: Tenderness present.     Cervical back: Normal range of motion and neck supple.     Right lower leg: No edema.     Left lower leg: No edema.     Comments: Right ankle brace, chronic pain.  Skin:    General: Skin is warm and dry.     Comments: Scattered scratched spots upper back, neck, no apparent rash, the patient stated she scratched because of itching.   Neurological:     General: No focal deficit present.     Mental Status: She is alert. Mental status is at baseline.     Motor: No weakness.     Gait: Gait abnormal.     Comments: Oriented to person, time  Psychiatric:        Mood and Affect: Mood normal.        Thought Content: Thought content normal.     Comments: Confused her  locations.      Labs reviewed: Recent Labs    11/24/21 1023 11/26/21 0420 01/22/22 0925 04/08/22 0755  NA 135 135 138 139  K 3.9 3.7 4.5 4.3  CL 101 104 104 104  CO2 26 23 25  24*  GLUCOSE 122* 109* 125*  --   BUN 22 29* 21 24*  CREATININE 1.20* 1.23* 1.23* 1.2*  CALCIUM 9.5 8.7* 9.3 9.3   Recent Labs    11/24/21 1023 01/22/22 0925  AST 23 26  ALT 16 16  ALKPHOS 67 68  BILITOT 0.6  0.6  PROT 8.5* 7.1  ALBUMIN 4.2 3.5   Recent Labs    11/24/21 1023 11/26/21 0420 01/22/22 0925 04/08/22 0755  WBC 8.2 8.9 7.8 7.3  NEUTROABS 6.6  --  6.3 3,833.00  HGB 13.9 12.7 12.6 12.4  HCT 44.4 40.1 40.6 38  MCV 84.6 84.2 84.1  --   PLT 237 202 191 224   Lab Results  Component Value Date   TSH 3.19 03/27/2022   Lab Results  Component Value Date   HGBA1C 5.7 (H) 11/24/2021   Lab Results  Component Value Date   CHOL 181 11/25/2021   HDL 37 (L) 11/25/2021   LDLCALC 127 (H) 11/25/2021   TRIG 85 11/25/2021   CHOLHDL 4.9 11/25/2021    Significant Diagnostic Results in last 30 days:  No results found.  Assessment/Plan  Stroke (cerebrum) (HCC) 11/24/21 acute CVA left PCA embolic strokes, no apparent residual symptoms, on Eliquis f/u Neurology  CKD (chronic kidney disease) stage 3, GFR 30-59 ml/min (HCC) Bun/creat 24/1.2 04/08/22  Hypothyroidism takes Levothyroxine, TSH 3.19 03/27/22  Hyponatremia Na 139 04/08/22  Atrial flutter (HCC)  f/u Cardiology, off anticoagulation 2/2 falls in the past, restarted Eliquis after acute CVA 11/24/21  Senile dementia (HCC) SNF [facility] for supportive care, 01/29/22 MMSE 23/30  Essential hypertension Loose blood pressure control,  takes Hydralazine   Osteoporosis  takes Ca, Vit D  Insomnia Stable, off Ambien  HLD (hyperlipidemia) on Atorvastatin, LDL 127 11/25/21  Osteoarthritis  chronic R ankle pain, uses ankle brace,  ambulates with walker slowly,  w/c to go further  Urinary frequency  on Mirabegron, no change.     Family/ staff Communication: plan of care reviewed with the patient and charge nurse.   Labs/tests ordered:  none  Time spend 35 minutes.

## 2022-06-04 NOTE — Assessment & Plan Note (Signed)
on Mirabegron, no change 

## 2022-06-04 NOTE — Assessment & Plan Note (Addendum)
Loose blood pressure control,  takes Hydralazine

## 2022-06-04 NOTE — Assessment & Plan Note (Signed)
Bun/creat 24/1.2 04/08/22 

## 2022-06-04 NOTE — Assessment & Plan Note (Signed)
f/u Cardiology, off anticoagulation 2/2 falls in the past, restarted Eliquis after acute CVA 11/24/21 

## 2022-07-04 ENCOUNTER — Non-Acute Institutional Stay (SKILLED_NURSING_FACILITY): Payer: Medicare Other | Admitting: Nurse Practitioner

## 2022-07-04 ENCOUNTER — Encounter: Payer: Self-pay | Admitting: Nurse Practitioner

## 2022-07-04 DIAGNOSIS — F0390 Unspecified dementia without behavioral disturbance: Secondary | ICD-10-CM | POA: Diagnosis not present

## 2022-07-04 DIAGNOSIS — I4892 Unspecified atrial flutter: Secondary | ICD-10-CM

## 2022-07-04 DIAGNOSIS — M159 Polyosteoarthritis, unspecified: Secondary | ICD-10-CM

## 2022-07-04 DIAGNOSIS — R350 Frequency of micturition: Secondary | ICD-10-CM

## 2022-07-04 DIAGNOSIS — E039 Hypothyroidism, unspecified: Secondary | ICD-10-CM

## 2022-07-04 DIAGNOSIS — I10 Essential (primary) hypertension: Secondary | ICD-10-CM | POA: Diagnosis not present

## 2022-07-04 NOTE — Assessment & Plan Note (Signed)
SNF [facility] for supportive care, 01/29/22 MMSE 23/30 

## 2022-07-04 NOTE — Assessment & Plan Note (Signed)
Blood pressure is controlled,  takes Hydralazine  

## 2022-07-04 NOTE — Assessment & Plan Note (Signed)
No change,  on Mirabegron 

## 2022-07-04 NOTE — Assessment & Plan Note (Signed)
f/u Cardiology, off anticoagulation 2/2 falls in the past, restarted Eliquis after acute CVA 11/24/21, heart rate is in control.

## 2022-07-04 NOTE — Assessment & Plan Note (Signed)
chronic R ankle pain, uses ankle brace, ambulates with walker slowly,  w/c to go further 

## 2022-07-04 NOTE — Progress Notes (Signed)
Location:  [facility] Room Number: [id number] Place of Service:  SNF (31) Provider:  Amilcar Reever X, NP  Patient Care Team: Kenidee Cregan X, NP as PCP - General (Internal Medicine) [name], MD as PCP - Cardiology (Cardiology) Olivia Mackie, MD as Consulting Physician (Obstetrics and Gynecology) De Blanch, MD as Consulting Physician (Gynecology) Cleda Mccreedy, MD as Consulting Physician (Gynecologic Oncology)  Extended Emergency Contact Information Primary Emergency Contact: [facility] Address: [address]          [place], [place] [address] Darden Amber of [place] Home Phone: [phone] Mobile Phone: [phone] Relation: Daughter  Code Status:  FULL Goals of care: Advanced Directive information    07/04/2022   10:16 AM  Advanced Directives  Does Patient Have a Medical Advance Directive? Yes  Type of Estate agent of [place];Living will  Does patient want to make changes to medical advance directive? No - Patient declined  Copy of Healthcare Power of Attorney in Chart? Yes - validated most recent copy scanned in chart (See row information)     Chief Complaint  Patient presents with   Medical Management of Chronic Issues    Patient is here for a follow up for chronic conditions Patient is also due for updated vaccines Patient has AWV due coming up after 08/25/2022    HPI:  Pt is a 89 y.o. female seen today for medical management of chronic diseases.     11/24/21 acute CVA left PCA embolic strokes, no apparent residual symptoms, on Eliquis f/u Neurology             CKD, Bun/creat 24/1.2 04/08/22             Hypothyroidism, takes Levothyroxine, TSH 3.19 03/27/22             Hyponatremia, Na 139 04/08/22             Syncope: Orthostasis ?, no further recurrence since admitted to SNF [facility]             A flutter, f/u Cardiology, off anticoagulation 2/2 falls in the past, restarted Eliquis after acute CVA  11/24/21             Hx of ovarian CA s/p TAH/BSO 03/2011, in remission.             Senile dementia,  SNF [facility] for supportive care, 01/29/22 MMSE 23/30             Constipation takes Colace.              HTN, takes Hydralazine              OP, takes Ca, Vit D             Insomnia, off Ambien.              Hyperlipidemia, on Atorvastatin, LDL 127 11/25/21             OA, chronic R ankle pain, uses ankle brace,  ambulates with walker slowly,  w/c to go further             Urinary frequency, on Mirabegron   Past Medical History:  Diagnosis Date   Anemia associated with acute blood loss 04/03/2011   Arthritis    knees    Ascites    Blood transfusion    hx of 2011    Complication of anesthesia    Sodium drops per pt    Cystitis  DIARRHEA, ANTIBIOTIC ASSOCIATED 05/31/2009   Qualifier: Diagnosis of  By: Daiva Eves MD, Remi Haggard     GERD (gastroesophageal reflux disease)    H/O hiatal hernia    Hyperlipidemia    Hypertension    Hyponatremia    Neutropenia with fever (HCC) 05/18/2011   OSTEOMYELITIS, CHRONIC, LOWER LEG 04/23/2009   Qualifier: Diagnosis of  By: Ninetta Lights MD, Linus Mako    OSTEOPOROSIS 04/23/2009   Qualifier: Diagnosis of  By: Ninetta Lights MD, Jeffrey     Ovarian cancer (HCC) 01/25/2011   Pneumonia    hx of    Postmenopausal atrophic vaginitis    PROSTHETIC JOINT COMPLICATION 05/28/2009   Qualifier: Diagnosis of  By: Daiva Eves MD, Cornelius     Renal disorder    Decreased kidney function   Staph infection 2010   after knee replacement   Urinary frequency    Vulvitis    Past Surgical History:  Procedure Laterality Date   ABDOMINAL HYSTERECTOMY  04/01/2011   Procedure: HYSTERECTOMY ABDOMINAL;  Surgeon: Jeannette Corpus, MD;  Location: [facility];  Service: Gynecology;  Laterality: N/A;   APPENDECTOMY     JOINT REPLACEMENT     R knee in 2008, 5 operations on L knee   LAPAROTOMY  04/01/2011   Procedure: EXPLORATORY LAPAROTOMY;  Surgeon: Jeannette Corpus,  MD;  Location: [facility];  Service: Gynecology;  Laterality: N/A;   OTHER SURGICAL HISTORY     hx of C section 1966   SALPINGOOPHORECTOMY  04/01/2011   Procedure: SALPINGO OOPHERECTOMY;  Surgeon: Jeannette Corpus, MD;  Location: [facility];  Service: Gynecology;  Laterality: Bilateral;   TONSILLECTOMY      Allergies  Allergen Reactions   Morphine And Codeine Anaphylaxis and Other (See Comments)    Given after knee replacement and code blue occurred   Morphine Sulfate Anaphylaxis   Penicillins Hives, Itching and Other (See Comments)    Syncope, also   Nsaids Other (See Comments)    Because of an abnormal kidney test result   Penicillin V Hives   Shellfish Allergy Nausea And Vomiting and Other (See Comments)    Pt has shellfish allergy only.  Has had IV contrast x 2 and did fine.   Shellfish-Derived Products Nausea And Vomiting    Outpatient Encounter Medications as of 07/04/2022  Medication Sig   amLODipine (NORVASC) 2.5 MG tablet Take 2.5 mg by mouth at bedtime.   apixaban (ELIQUIS) 5 MG TABS tablet Take 1 tablet (5 mg total) by mouth 2 (two) times daily.   atorvastatin (LIPITOR) 40 MG tablet Take 1 tablet (40 mg total) by mouth daily.   B Complex-C-Folic Acid (B-COMPLEX/FOLIC ACID/VITAMIN C PO) Take 1 tablet by mouth daily with breakfast.   bimatoprost (LUMIGAN) 0.03 % ophthalmic solution Place 1 drop into both eyes at bedtime.   Cholecalciferol (VITAMIN D3) 50 MCG (2000 UT) TABS Take 2,000 Units by mouth in the morning.   cloNIDine (CATAPRES) 0.1 MG tablet Take 0.1 mg by mouth daily.   diclofenac Sodium (VOLTAREN) 1 % GEL Apply 2 g topically in the morning and at bedtime.   docusate sodium (COLACE) 100 MG capsule Take 100 mg by mouth daily.   hydrocortisone cream 1 % Apply 1 Application topically 2 (two) times daily.   levothyroxine (SYNTHROID) 25 MCG tablet Take 25 mcg by mouth daily before breakfast.   loratadine (CLARITIN) 10 MG tablet Take 10 mg by mouth daily.   melatonin 3  MG TABS tablet Take  1 tablet (3 mg total) by mouth at bedtime.   miconazole (MICOTIN) 2 % powder Apply topically daily. Patient may apply to dry skin under breasts daily and after showering   mirabegron ER (MYRBETRIQ) 25 MG TB24 tablet Take 25 mg by mouth daily.   nystatin (MYCOSTATIN/NYSTOP) powder Apply 1 application topically 2 (two) times daily as needed.   pantoprazole (PROTONIX) 40 MG tablet Take 40 mg by mouth daily.   polyvinyl alcohol (LIQUIFILM TEARS) 1.4 % ophthalmic solution Place 1 drop into both eyes in the morning, at noon, in the evening, and at bedtime.   sodium chloride 1 g tablet Take 1 g by mouth 2 (two) times daily.   No facility-administered encounter medications on file as of 07/04/2022.    Review of Systems  Constitutional:  Negative for appetite change and fever.  HENT:  Positive for hearing loss. Negative for congestion and trouble swallowing.   Eyes:  Negative for visual disturbance.  Respiratory:  Negative for shortness of breath.   Cardiovascular:  Negative for leg swelling.  Gastrointestinal:  Negative for abdominal pain and constipation.  Genitourinary:  Positive for frequency. Negative for difficulty urinating, dysuria, flank pain and urgency.  Musculoskeletal:  Positive for arthralgias, back pain and gait problem.       R ankle pain is chronic  Skin:  Negative for color change.  Neurological:  Negative for speech difficulty, weakness and light-headedness.       Memory deficit  Psychiatric/Behavioral:  Positive for confusion. Negative for sleep disturbance. The patient is not nervous/anxious.     Immunization History  Administered Date(s) Administered   Influenza Split 11/13/2012, 12/13/2012, 11/30/2013, 10/09/2014, 11/24/2018   Influenza, High Dose Seasonal PF 12/01/2013, 10/04/2014, 10/12/2015, 11/06/2016, 11/12/2017   Influenza,inj,Quad PF,6+ Mos 10/24/2010   Influenza-Unspecified 10/24/2010, 11/01/2019, 12/04/2020   Moderna Covid-19 Vaccine Bivalent  Booster 63yrs & up 07/24/2021   Moderna SARS-COV2 Booster Vaccination 07/18/2020   Moderna Sars-Covid-2 Vaccination 02/14/2019, 03/14/2019, 12/26/2019   PFIZER(Purple Top)SARS-COV-2 Vaccination 10/31/2020   PPD Test 11/27/2021   Pfizer Covid-19 Vaccine Bivalent Booster 64yrs & up 10/31/2020   Pneumococcal Conjugate-13 09/28/2013   Pneumococcal Polysaccharide-23 03/05/2012   Td 10/16/2010   Td (Adult) 08/27/2000   Tdap 08/27/2000, 10/16/2010   Zoster, Live 01/11/2013, 03/31/2020   Pertinent  Health Maintenance Due  Topic Date Due   INFLUENZA VACCINE  09/11/2022   DEXA SCAN  Completed      02/12/2022    4:30 PM 03/25/2022   11:07 AM 04/03/2022    3:37 PM 04/18/2022    9:19 AM 07/04/2022   10:17 AM  Fall Risk  Falls in the past year? 0 0 0 0 1  Was there an injury with Fall? 0 0 0 0 0  Fall Risk Category Calculator 0 0 0 0 2  Fall Risk Category (Retired) Low      (RETIRED) Patient Fall Risk Level High fall risk      Patient at Risk for Falls Due to History of fall(s);Impaired balance/gait;Impaired mobility;Mental status change History of fall(s);Impaired balance/gait;Impaired mobility;Mental status change History of fall(s);Impaired balance/gait;Impaired mobility;Mental status change History of fall(s);Impaired balance/gait;Impaired mobility;Mental status change History of fall(s);Impaired balance/gait;Impaired mobility;Mental status change  Fall risk Follow up Falls evaluation completed Falls evaluation completed Falls evaluation completed Falls evaluation completed Falls evaluation completed   Functional Status Survey:    Vitals:   07/04/22 1015  BP: 136/76  Pulse: 80  Resp: 16  Temp: (!) 97.3 F (36.3 C)  SpO2: 97%  Weight:  190 lb 12.8 oz (86.5 kg)  Height: 5\' 3"  (1.6 m)   Body mass index is 33.8 kg/m. Physical Exam Vitals and nursing note reviewed.  Constitutional:      Appearance: Normal appearance.  HENT:     Head: Normocephalic and atraumatic.     Mouth/Throat:      Mouth: Mucous membranes are moist.  Eyes:     Extraocular Movements: Extraocular movements intact.     Conjunctiva/sclera: Conjunctivae normal.     Pupils: Pupils are equal, round, and reactive to light.  Cardiovascular:     Rate and Rhythm: Normal rate. Rhythm irregular.     Heart sounds: No murmur heard. Pulmonary:     Effort: Pulmonary effort is normal.     Breath sounds: No rales.  Abdominal:     General: Bowel sounds are normal.     Palpations: Abdomen is soft.     Tenderness: There is no abdominal tenderness.     Hernia: A hernia is present.     Comments: Incision hernia  Musculoskeletal:        General: Tenderness present.     Cervical back: Normal range of motion and neck supple.     Right lower leg: No edema.     Left lower leg: No edema.     Comments: Right ankle brace, chronic pain.  Skin:    General: Skin is warm and dry.     Comments: Scattered scratched spots upper back, neck, no apparent rash, the patient stated she scratched because of itching.   Neurological:     General: No focal deficit present.     Mental Status: She is alert. Mental status is at baseline.     Motor: No weakness.     Gait: Gait abnormal.     Comments: Oriented to person, time  Psychiatric:        Mood and Affect: Mood normal.        Thought Content: Thought content normal.     Comments: Confused her locations.      Labs reviewed: Recent Labs    11/24/21 1023 11/26/21 0420 01/22/22 0925 04/08/22 0755  NA 135 135 138 139  K 3.9 3.7 4.5 4.3  CL 101 104 104 104  CO2 26 23 25  24*  GLUCOSE 122* 109* 125*  --   BUN 22 29* 21 24*  CREATININE 1.20* 1.23* 1.23* 1.2*  CALCIUM 9.5 8.7* 9.3 9.3   Recent Labs    11/24/21 1023 01/22/22 0925  AST 23 26  ALT 16 16  ALKPHOS 67 68  BILITOT 0.6 0.6  PROT 8.5* 7.1  ALBUMIN 4.2 3.5   Recent Labs    11/24/21 1023 11/26/21 0420 01/22/22 0925 04/08/22 0755  WBC 8.2 8.9 7.8 7.3  NEUTROABS 6.6  --  6.3 3,833.00  HGB 13.9 12.7 12.6  12.4  HCT 44.4 40.1 40.6 38  MCV 84.6 84.2 84.1  --   PLT 237 202 191 224   Lab Results  Component Value Date   TSH 3.19 03/27/2022   Lab Results  Component Value Date   HGBA1C 5.7 (H) 11/24/2021   Lab Results  Component Value Date   CHOL 181 11/25/2021   HDL 37 (L) 11/25/2021   LDLCALC 127 (H) 11/25/2021   TRIG 85 11/25/2021   CHOLHDL 4.9 11/25/2021    Significant Diagnostic Results in last 30 days:  No results found.  Assessment/Plan Atrial flutter (HCC)  f/u Cardiology, off anticoagulation 2/2 falls in the past, restarted  Eliquis after acute CVA 11/24/21, heart rate is in control.   Senile dementia (HCC)   SNF [facility] for supportive care, 01/29/22 MMSE 23/30  Essential hypertension Blood pressure is controlled, takes Hydralazine   Osteoarthritis  chronic R ankle pain, uses ankle brace,  ambulates with walker slowly,  w/c to go further  Urinary frequency No change,  on Mirabegron     Family/ staff Communication: plan of care reviewed with the patient and charge nurse.   Labs/tests ordered:  none  Time spend 35 minutes.

## 2022-07-04 NOTE — Assessment & Plan Note (Signed)
Stable,  takes Levothyroxine, TSH 3.19 2/15/2

## 2022-07-16 ENCOUNTER — Non-Acute Institutional Stay (SKILLED_NURSING_FACILITY): Payer: Medicare Other | Admitting: Adult Health

## 2022-07-16 ENCOUNTER — Encounter: Payer: Self-pay | Admitting: Adult Health

## 2022-07-16 DIAGNOSIS — I10 Essential (primary) hypertension: Secondary | ICD-10-CM | POA: Diagnosis not present

## 2022-07-16 DIAGNOSIS — M25561 Pain in right knee: Secondary | ICD-10-CM

## 2022-07-16 DIAGNOSIS — I4892 Unspecified atrial flutter: Secondary | ICD-10-CM | POA: Diagnosis not present

## 2022-07-16 NOTE — Progress Notes (Signed)
Location:  [facility] Room Number: N043-A Place of Service:  SNF (31) Provider:  Kenard Gower, DNP, FNP-BC  Patient Care Team: Mast, Man X, NP as PCP - General (Internal Medicine) [name], MD as PCP - Cardiology (Cardiology) Olivia Mackie, MD as Consulting Physician (Obstetrics and Gynecology) De Blanch, MD as Consulting Physician (Gynecology) Cleda Mccreedy, MD as Consulting Physician (Gynecologic Oncology)  Extended Emergency Contact Information Primary Emergency Contact: [facility] Address: [address]          [place], [place] [address] Darden [place] of [place] Home Phone: [phone] Mobile Phone: [phone] Relation: Daughter  Code Status:  Full Code  Goals of care: Advanced Directive information    07/16/2022   12:20 PM  Advanced Directives  Does Patient Have a Medical Advance Directive? Yes  Type of Estate agent of [place];Living will  Does patient want to make changes to medical advance directive? No - Patient declined  Copy of Healthcare Power of Attorney in Chart? Yes - validated most recent copy scanned in chart (See row information)     Chief Complaint  Patient presents with   Acute Visit    Right knee pain    HPI:  Pt is a 89 y.o. female seen today for right knee pain. She is a long-term care resident of [facility]. Daughter was at bedside visiting. Patient complained of right knee pain. Imaging showed normal right knee with arthroplasty. No reported trauma.  She takes Eliquis for atrial flutter. Latest BP 130/64. She takes Amlodipine and Clonidine PRN for hypertension.  Past Medical History:  Diagnosis Date   Anemia associated with acute blood loss 04/03/2011   Arthritis    knees    Ascites    Blood transfusion    hx of 2011    Complication of anesthesia    Sodium drops per pt    Cystitis    DIARRHEA, ANTIBIOTIC ASSOCIATED 05/31/2009    Qualifier: Diagnosis of  By: Daiva Eves MD, Cornelius     GERD (gastroesophageal reflux disease)    H/O hiatal hernia    Hyperlipidemia    Hypertension    Hyponatremia    Neutropenia with fever (HCC) 05/18/2011   OSTEOMYELITIS, CHRONIC, LOWER LEG 04/23/2009   Qualifier: Diagnosis of  By: Ninetta Lights MD, Linus Mako    OSTEOPOROSIS 04/23/2009   Qualifier: Diagnosis of  By: Ninetta Lights MD, Jeffrey     Ovarian cancer (HCC) 01/25/2011   Pneumonia    hx of    Postmenopausal atrophic vaginitis    PROSTHETIC JOINT COMPLICATION 05/28/2009   Qualifier: Diagnosis of  By: Daiva Eves MD, Cornelius     Renal disorder    Decreased kidney function   Staph infection 2010   after knee replacement   Urinary frequency    Vulvitis    Past Surgical History:  Procedure Laterality Date   ABDOMINAL HYSTERECTOMY  04/01/2011   Procedure: HYSTERECTOMY ABDOMINAL;  Surgeon: Jeannette Corpus, MD;  Location: [facility];  Service: Gynecology;  Laterality: N/A;   APPENDECTOMY     JOINT REPLACEMENT     R knee in 2008, 5 operations on L knee   LAPAROTOMY  04/01/2011   Procedure: EXPLORATORY LAPAROTOMY;  Surgeon: Jeannette Corpus, MD;  Location: [facility];  Service: Gynecology;  Laterality: N/A;   OTHER SURGICAL HISTORY     hx of C section 1966   SALPINGOOPHORECTOMY  04/01/2011   Procedure: SALPINGO OOPHERECTOMY;  Surgeon: Reuel Boom  Michail Jewels, MD;  Location: [facility];  Service: Gynecology;  Laterality: Bilateral;   TONSILLECTOMY      Allergies  Allergen Reactions   Morphine And Codeine Anaphylaxis and Other (See Comments)    Given after knee replacement and code blue occurred   Morphine Sulfate Anaphylaxis   Penicillins Hives, Itching and Other (See Comments)    Syncope, also   Nsaids Other (See Comments)    Because of an abnormal kidney test result   Penicillin V Hives   Shellfish Allergy Nausea And Vomiting and Other (See Comments)    Pt has shellfish allergy only.  Has had IV contrast x 2 and did  fine.   Shellfish-Derived Products Nausea And Vomiting    Outpatient Encounter Medications as of 07/16/2022  Medication Sig   acetaminophen (TYLENOL) 325 MG tablet Take 650 mg by mouth every 4 (four) hours as needed for fever.   acetaminophen (TYLENOL) 325 MG tablet Take 650 mg by mouth every 4 (four) hours as needed for moderate pain.   amLODipine (NORVASC) 2.5 MG tablet Take 2.5 mg by mouth at bedtime.   apixaban (ELIQUIS) 5 MG TABS tablet Take 1 tablet (5 mg total) by mouth 2 (two) times daily.   atorvastatin (LIPITOR) 40 MG tablet Take 1 tablet (40 mg total) by mouth daily.   B Complex-C-Folic Acid (B-COMPLEX/FOLIC ACID/VITAMIN C PO) Take 1 tablet by mouth daily with breakfast.   bimatoprost (LUMIGAN) 0.03 % ophthalmic solution Place 1 drop into both eyes at bedtime.   Cholecalciferol (VITAMIN D3) 50 MCG (2000 UT) TABS Take 2,000 Units by mouth in the morning.   cloNIDine (CATAPRES) 0.1 MG tablet Take 0.1 mg by mouth daily.   diclofenac Sodium (VOLTAREN) 1 % GEL Apply 2 g topically in the morning and at bedtime.   docusate sodium (COLACE) 100 MG capsule Take 100 mg by mouth daily.   levothyroxine (SYNTHROID) 25 MCG tablet Take 25 mcg by mouth daily before breakfast.   melatonin 3 MG TABS tablet Take 1 tablet (3 mg total) by mouth at bedtime.   miconazole (MICOTIN) 2 % powder Apply topically daily. Patient may apply to dry skin under breasts daily and after showering   mirabegron ER (MYRBETRIQ) 25 MG TB24 tablet Take 25 mg by mouth daily.   nystatin (MYCOSTATIN/NYSTOP) powder Apply 1 application topically 2 (two) times daily as needed.   pantoprazole (PROTONIX) 40 MG tablet Take 40 mg by mouth daily.   polyvinyl alcohol (LIQUIFILM TEARS) 1.4 % ophthalmic solution Place 1 drop into both eyes in the morning, at noon, in the evening, and at bedtime.   sodium chloride 1 g tablet Take 1 g by mouth 2 (two) times daily.   [DISCONTINUED] hydrocortisone cream 1 % Apply 1 Application topically 2  (two) times daily.   [DISCONTINUED] loratadine (CLARITIN) 10 MG tablet Take 10 mg by mouth daily.   No facility-administered encounter medications on file as of 07/16/2022.    Review of Systems  Constitutional:  Negative for appetite change, chills, fatigue and fever.  HENT:  Negative for congestion, hearing loss, rhinorrhea and sore throat.   Eyes: Negative.   Respiratory:  Negative for cough, shortness of breath and wheezing.   Cardiovascular:  Negative for chest pain, palpitations and leg swelling.  Gastrointestinal:  Negative for abdominal pain, constipation, diarrhea, nausea and vomiting.  Genitourinary:  Negative for dysuria.  Musculoskeletal:  Positive for arthralgias. Negative for back pain and myalgias.  Skin:  Negative for color change, rash and wound.  Neurological:  Negative for dizziness, weakness and headaches.  Psychiatric/Behavioral:  Negative for behavioral problems. The patient is not nervous/anxious.        Immunization History  Administered Date(s) Administered   Influenza Split 11/13/2012, 12/13/2012, 11/30/2013, 10/09/2014, 11/24/2018   Influenza, High Dose Seasonal PF 12/01/2013, 10/04/2014, 10/12/2015, 11/06/2016, 11/12/2017   Influenza,inj,Quad PF,6+ Mos 10/24/2010   Influenza-Unspecified 10/24/2010, 11/01/2019, 12/04/2020   Moderna Covid-19 Vaccine Bivalent Booster 60yrs & up 07/24/2021   Moderna SARS-COV2 Booster Vaccination 07/18/2020   Moderna Sars-Covid-2 Vaccination 02/14/2019, 03/14/2019, 12/26/2019   PFIZER(Purple Top)SARS-COV-2 Vaccination 10/31/2020   PPD Test 11/27/2021   Pfizer Covid-19 Vaccine Bivalent Booster 38yrs & up 10/31/2020   Pneumococcal Conjugate-13 09/28/2013   Pneumococcal Polysaccharide-23 03/05/2012   Td 10/16/2010   Td (Adult) 08/27/2000   Tdap 08/27/2000, 10/16/2010   Zoster, Live 01/11/2013, 03/31/2020   Pertinent  Health Maintenance Due  Topic Date Due   INFLUENZA VACCINE  09/11/2022   DEXA SCAN  Completed       02/12/2022    4:30 PM 03/25/2022   11:07 AM 04/03/2022    3:37 PM 04/18/2022    9:19 AM 07/04/2022   10:17 AM  Fall Risk  Falls in the past year? 0 0 0 0 1  Was there an injury with Fall? 0 0 0 0 0  Fall Risk Category Calculator 0 0 0 0 2  Fall Risk Category (Retired) Low      (RETIRED) Patient Fall Risk Level High fall risk      Patient at Risk for Falls Due to History of fall(s);Impaired balance/gait;Impaired mobility;Mental status change History of fall(s);Impaired balance/gait;Impaired mobility;Mental status change History of fall(s);Impaired balance/gait;Impaired mobility;Mental status change History of fall(s);Impaired balance/gait;Impaired mobility;Mental status change History of fall(s);Impaired balance/gait;Impaired mobility;Mental status change  Fall risk Follow up Falls evaluation completed Falls evaluation completed Falls evaluation completed Falls evaluation completed Falls evaluation completed     Vitals:   07/16/22 1227  BP: 130/64  Pulse: 82  Resp: 16  Temp: (!) 97.1 F (36.2 C)  SpO2: 95%  Weight: 187 lb 3.2 oz (84.9 kg)  Height: 5\' 3"  (1.6 m)   Body mass index is 33.16 kg/m.  Physical Exam Constitutional:      Appearance: She is obese.  HENT:     Head: Normocephalic and atraumatic.     Nose: Nose normal.     Mouth/Throat:     Mouth: Mucous membranes are moist.  Eyes:     Conjunctiva/sclera: Conjunctivae normal.  Cardiovascular:     Rate and Rhythm: Normal rate and regular rhythm.  Pulmonary:     Effort: Pulmonary effort is normal.     Breath sounds: Normal breath sounds.  Abdominal:     General: Bowel sounds are normal.     Palpations: Abdomen is soft.  Musculoskeletal:        General: Normal range of motion.     Cervical back: Normal range of motion.  Skin:    General: Skin is warm and dry.  Neurological:     Mental Status: Mental status is at baseline.  Psychiatric:        Mood and Affect: Mood normal.        Behavior: Behavior normal.        Labs reviewed: Recent Labs    11/24/21 1023 11/26/21 0420 01/22/22 0925 04/08/22 0755  NA 135 135 138 139  K 3.9 3.7 4.5 4.3  CL 101 104 104 104  CO2 26 23 25  24*  GLUCOSE 122*  109* 125*  --   BUN 22 29* 21 24*  CREATININE 1.20* 1.23* 1.23* 1.2*  CALCIUM 9.5 8.7* 9.3 9.3   Recent Labs    11/24/21 1023 01/22/22 0925  AST 23 26  ALT 16 16  ALKPHOS 67 68  BILITOT 0.6 0.6  PROT 8.5* 7.1  ALBUMIN 4.2 3.5   Recent Labs    11/24/21 1023 11/26/21 0420 01/22/22 0925 04/08/22 0755  WBC 8.2 8.9 7.8 7.3  NEUTROABS 6.6  --  6.3 3,833.00  HGB 13.9 12.7 12.6 12.4  HCT 44.4 40.1 40.6 38  MCV 84.6 84.2 84.1  --   PLT 237 202 191 224   Lab Results  Component Value Date   TSH 3.19 03/27/2022   Lab Results  Component Value Date   HGBA1C 5.7 (H) 11/24/2021   Lab Results  Component Value Date   CHOL 181 11/25/2021   HDL 37 (L) 11/25/2021   LDLCALC 127 (H) 11/25/2021   TRIG 85 11/25/2021   CHOLHDL 4.9 11/25/2021    Significant Diagnostic Results in last 30 days:  No results found.  Assessment/Plan  1. Acute pain of right knee -  imaging was negative for fracture -  Diclofenac 1% gel to right knee TID for pain  2. Essential hypertension -  BPs stable -  continue Amlodipine and Clonidine PRN  3. Atrial flutter, unspecified type (HCC) -  rate-controlled -  continue Eliquis   Family/ staff Communication: Discussed plan of care with resident, daughter and charge nurse.  Labs/tests ordered: None    Kenard Gower, DNP, MSN, FNP-BC [facility] [phone] (Monday-Friday 8:00 a.m. - 5:00 p.m.) [phone] (after hours)

## 2022-07-17 ENCOUNTER — Encounter: Payer: Self-pay | Admitting: Nurse Practitioner

## 2022-07-17 ENCOUNTER — Non-Acute Institutional Stay (SKILLED_NURSING_FACILITY): Payer: Medicare Other | Admitting: Nurse Practitioner

## 2022-07-17 DIAGNOSIS — M79604 Pain in right leg: Secondary | ICD-10-CM | POA: Diagnosis not present

## 2022-07-17 DIAGNOSIS — E871 Hypo-osmolality and hyponatremia: Secondary | ICD-10-CM

## 2022-07-17 DIAGNOSIS — N1831 Chronic kidney disease, stage 3a: Secondary | ICD-10-CM | POA: Diagnosis not present

## 2022-07-17 DIAGNOSIS — K5901 Slow transit constipation: Secondary | ICD-10-CM

## 2022-07-17 DIAGNOSIS — I639 Cerebral infarction, unspecified: Secondary | ICD-10-CM | POA: Diagnosis not present

## 2022-07-17 DIAGNOSIS — R55 Syncope and collapse: Secondary | ICD-10-CM

## 2022-07-17 DIAGNOSIS — F0390 Unspecified dementia without behavioral disturbance: Secondary | ICD-10-CM

## 2022-07-17 DIAGNOSIS — R400 Somnolence: Secondary | ICD-10-CM | POA: Diagnosis not present

## 2022-07-17 DIAGNOSIS — I4892 Unspecified atrial flutter: Secondary | ICD-10-CM

## 2022-07-17 DIAGNOSIS — I10 Essential (primary) hypertension: Secondary | ICD-10-CM

## 2022-07-17 DIAGNOSIS — E039 Hypothyroidism, unspecified: Secondary | ICD-10-CM

## 2022-07-17 DIAGNOSIS — R4182 Altered mental status, unspecified: Secondary | ICD-10-CM | POA: Insufficient documentation

## 2022-07-17 DIAGNOSIS — F5101 Primary insomnia: Secondary | ICD-10-CM

## 2022-07-17 DIAGNOSIS — M159 Polyosteoarthritis, unspecified: Secondary | ICD-10-CM

## 2022-07-17 NOTE — Assessment & Plan Note (Signed)
chronic R ankle pain, uses ankle brace, ambulates with walker slowly,  w/c to go further 

## 2022-07-17 NOTE — Assessment & Plan Note (Signed)
The patient c/o right leg pain when the patient attempted to stand up, no deformity or redness noted.  The patient stopped using walker after fall 07/15/22 Xray lumbar spine, sacrum, pelvis, R femur, knee, tibia, fibula

## 2022-07-17 NOTE — Progress Notes (Signed)
Location:  [facility] Room Number: NO/43/A Place of Service:  SNF (31) Provider:  Durelle Zepeda X, NP  Patient Care Team: Manju Kulkarni X, NP as PCP - General (Internal Medicine) [name], MD as PCP - Cardiology (Cardiology) Olivia Mackie, MD as Consulting Physician (Obstetrics and Gynecology) De Blanch, MD as Consulting Physician (Gynecology) Cleda Mccreedy, MD as Consulting Physician (Gynecologic Oncology)  Extended Emergency Contact Information Primary Emergency Contact: [facility] Address: [address]          [place], [place] [address] D[place] of [place] Home Phone: [phone] Mobile Phone: [phone] Relation: Daughter  Code Status:  FULL Goals of care: Advanced Directive information    07/16/2022   12:20 PM  Advanced Directives  Does Patient Have a Medical Advance Directive? Yes  Type of Estate agent of [place];Living will  Does patient want to make changes to medical advance directive? No - Patient declined  Copy of Healthcare Power of Attorney in Chart? Yes - validated most recent copy scanned in chart (See row information)     Chief Complaint  Patient presents with   Acute Visit     Patient is being seen for  lethargy : acting sluggish and in a daze, lack of awareness/responsiveness  Slumped over in wheelchair not being normal self Had fall several days ago    HPI:  Pt is a 89 y.o. female seen today for an acute visit for reported the patient has been lethargic, was able to be aroused during my examination, followed simple directions, no new focal weakness noted. The patient c/o right leg pain when the patient attempted to stand up, no deformity or redness noted.      11/24/21 acute CVA left PCA embolic strokes, no apparent residual symptoms, on Eliquis f/u Neurology             CKD, Bun/creat 24/1.2 04/08/22             Hypothyroidism, takes Levothyroxine, TSH 3.19 03/27/22              Hyponatremia, Na 139 04/08/22             Syncope: Orthostasis ?, no further recurrence since admitted to SNF [facility]             A flutter, f/u Cardiology, off anticoagulation 2/2 falls in the past, restarted Eliquis after acute CVA 11/24/21             Hx of ovarian CA s/p TAH/BSO 03/2011, in remission.             Senile dementia,  SNF [facility] for supportive care, 01/29/22 MMSE 23/30             Constipation takes Colace.              HTN, takes Hydralazine              OP, takes Ca, Vit D             Insomnia, off Ambien.              Hyperlipidemia, on Atorvastatin, LDL 127 11/25/21             OA, chronic R ankle pain, uses ankle brace,  ambulates with walker slowly,  w/c to go further             Urinary frequency, on Mirabegron  Past Medical History:  Diagnosis Date   Anemia associated with acute blood  loss 04/03/2011   Arthritis    knees    Ascites    Blood transfusion    hx of 2011    Complication of anesthesia    Sodium drops per pt    Cystitis    DIARRHEA, ANTIBIOTIC ASSOCIATED 05/31/2009   Qualifier: Diagnosis of  By: Daiva Eves MD, Cornelius     GERD (gastroesophageal reflux disease)    H/O hiatal hernia    Hyperlipidemia    Hypertension    Hyponatremia    Neutropenia with fever (HCC) 05/18/2011   OSTEOMYELITIS, CHRONIC, LOWER LEG 04/23/2009   Qualifier: Diagnosis of  By: Ninetta Lights MD, Linus Mako    OSTEOPOROSIS 04/23/2009   Qualifier: Diagnosis of  By: Ninetta Lights MD, Jeffrey     Ovarian cancer (HCC) 01/25/2011   Pneumonia    hx of    Postmenopausal atrophic vaginitis    PROSTHETIC JOINT COMPLICATION 05/28/2009   Qualifier: Diagnosis of  By: Daiva Eves MD, Cornelius     Renal disorder    Decreased kidney function   Staph infection 2010   after knee replacement   Urinary frequency    Vulvitis    Past Surgical History:  Procedure Laterality Date   ABDOMINAL HYSTERECTOMY  04/01/2011   Procedure: HYSTERECTOMY ABDOMINAL;  Surgeon: Jeannette Corpus, MD;   Location: [facility];  Service: Gynecology;  Laterality: N/A;   APPENDECTOMY     JOINT REPLACEMENT     R knee in 2008, 5 operations on L knee   LAPAROTOMY  04/01/2011   Procedure: EXPLORATORY LAPAROTOMY;  Surgeon: Jeannette Corpus, MD;  Location: [facility];  Service: Gynecology;  Laterality: N/A;   OTHER SURGICAL HISTORY     hx of C section 1966   SALPINGOOPHORECTOMY  04/01/2011   Procedure: SALPINGO OOPHERECTOMY;  Surgeon: Jeannette Corpus, MD;  Location: [facility];  Service: Gynecology;  Laterality: Bilateral;   TONSILLECTOMY      Allergies  Allergen Reactions   Morphine And Codeine Anaphylaxis and Other (See Comments)    Given after knee replacement and code blue occurred   Morphine Sulfate Anaphylaxis   Penicillins Hives, Itching and Other (See Comments)    Syncope, also   Nsaids Other (See Comments)    Because of an abnormal kidney test result   Penicillin V Hives   Shellfish Allergy Nausea And Vomiting and Other (See Comments)    Pt has shellfish allergy only.  Has had IV contrast x 2 and did fine.   Shellfish-Derived Products Nausea And Vomiting    Outpatient Encounter Medications as of 07/17/2022  Medication Sig   acetaminophen (TYLENOL) 325 MG tablet Take 650 mg by mouth every 4 (four) hours as needed for fever.   acetaminophen (TYLENOL) 325 MG tablet Take 650 mg by mouth every 4 (four) hours as needed for moderate pain.   amLODipine (NORVASC) 2.5 MG tablet Take 2.5 mg by mouth at bedtime.   apixaban (ELIQUIS) 5 MG TABS tablet Take 1 tablet (5 mg total) by mouth 2 (two) times daily.   atorvastatin (LIPITOR) 40 MG tablet Take 1 tablet (40 mg total) by mouth daily.   B Complex-C-Folic Acid (B-COMPLEX/FOLIC ACID/VITAMIN C PO) Take 1 tablet by mouth daily with breakfast.   bimatoprost (LUMIGAN) 0.03 % ophthalmic solution Place 1 drop into both eyes at bedtime.   Cholecalciferol (VITAMIN D3) 50 MCG (2000 UT) TABS Take 2,000 Units by mouth in the morning.   cloNIDine  (CATAPRES) 0.1 MG tablet Take 0.1 mg  by mouth daily.   diclofenac Sodium (VOLTAREN) 1 % GEL Apply 2 g topically in the morning and at bedtime.   docusate sodium (COLACE) 100 MG capsule Take 100 mg by mouth daily.   levothyroxine (SYNTHROID) 25 MCG tablet Take 25 mcg by mouth daily before breakfast.   melatonin 3 MG TABS tablet Take 1 tablet (3 mg total) by mouth at bedtime.   miconazole (MICOTIN) 2 % powder Apply topically daily. Patient may apply to dry skin under breasts daily and after showering   mirabegron ER (MYRBETRIQ) 25 MG TB24 tablet Take 25 mg by mouth daily.   nystatin (MYCOSTATIN/NYSTOP) powder Apply 1 application topically 2 (two) times daily as needed.   pantoprazole (PROTONIX) 40 MG tablet Take 40 mg by mouth daily.   polyvinyl alcohol (LIQUIFILM TEARS) 1.4 % ophthalmic solution Place 1 drop into both eyes in the morning, at noon, in the evening, and at bedtime.   sodium chloride 1 g tablet Take 1 g by mouth 2 (two) times daily.   No facility-administered encounter medications on file as of 07/17/2022.    Review of Systems  Constitutional:  Positive for fatigue. Negative for appetite change and fever.       Sleepy/lethargic, able to be aroused.   HENT:  Positive for hearing loss. Negative for congestion and trouble swallowing.   Eyes:  Negative for visual disturbance.  Respiratory:  Negative for shortness of breath.   Cardiovascular:  Negative for leg swelling.  Gastrointestinal:  Negative for abdominal pain and constipation.  Genitourinary:  Positive for frequency. Negative for difficulty urinating, dysuria, flank pain and urgency.  Musculoskeletal:  Positive for arthralgias, back pain and gait problem.       R ankle pain is chronic. New right leg pain, unable to specify the location, stopped walking with walker.   Skin:  Negative for color change.  Neurological:  Negative for speech difficulty, weakness and light-headedness.       Memory deficit  Psychiatric/Behavioral:   Positive for confusion. Negative for sleep disturbance. The patient is not nervous/anxious.        Followed simple directions    Immunization History  Administered Date(s) Administered   Influenza Split 11/13/2012, 12/13/2012, 11/30/2013, 10/09/2014, 11/24/2018   Influenza, High Dose Seasonal PF 12/01/2013, 10/04/2014, 10/12/2015, 11/06/2016, 11/12/2017   Influenza,inj,Quad PF,6+ Mos 10/24/2010   Influenza-Unspecified 10/24/2010, 11/01/2019, 12/04/2020   Moderna Covid-19 Vaccine Bivalent Booster 35yrs & up 07/24/2021   Moderna SARS-COV2 Booster Vaccination 07/18/2020   Moderna Sars-Covid-2 Vaccination 02/14/2019, 03/14/2019, 12/26/2019   PFIZER(Purple Top)SARS-COV-2 Vaccination 10/31/2020   PPD Test 11/27/2021   Pfizer Covid-19 Vaccine Bivalent Booster 49yrs & up 10/31/2020   Pneumococcal Conjugate-13 09/28/2013   Pneumococcal Polysaccharide-23 03/05/2012   Td 10/16/2010   Td (Adult) 08/27/2000   Tdap 08/27/2000, 10/16/2010   Zoster, Live 01/11/2013, 03/31/2020   Pertinent  Health Maintenance Due  Topic Date Due   INFLUENZA VACCINE  09/11/2022   DEXA SCAN  Completed      02/12/2022    4:30 PM 03/25/2022   11:07 AM 04/03/2022    3:37 PM 04/18/2022    9:19 AM 07/04/2022   10:17 AM  Fall Risk  Falls in the past year? 0 0 0 0 1  Was there an injury with Fall? 0 0 0 0 0  Fall Risk Category Calculator 0 0 0 0 2  Fall Risk Category (Retired) Low      (RETIRED) Patient Fall Risk Level High fall risk  Patient at Risk for Falls Due to History of fall(s);Impaired balance/gait;Impaired mobility;Mental status change History of fall(s);Impaired balance/gait;Impaired mobility;Mental status change History of fall(s);Impaired balance/gait;Impaired mobility;Mental status change History of fall(s);Impaired balance/gait;Impaired mobility;Mental status change History of fall(s);Impaired balance/gait;Impaired mobility;Mental status change  Fall risk Follow up Falls evaluation completed Falls  evaluation completed Falls evaluation completed Falls evaluation completed Falls evaluation completed   Functional Status Survey:    Vitals:   07/17/22 1459  BP: (!) 149/63  Pulse: 90  Resp: 16  Temp: (!) 97.1 F (36.2 C)  SpO2: 95%  Weight: 187 lb (84.8 kg)  Height: 5\' 3"  (1.6 m)   Body mass index is 33.13 kg/m. Physical Exam Vitals and nursing note reviewed.  Constitutional:      Comments: sleepy  HENT:     Head: Normocephalic and atraumatic.     Mouth/Throat:     Mouth: Mucous membranes are moist.  Eyes:     Extraocular Movements: Extraocular movements intact.     Conjunctiva/sclera: Conjunctivae normal.     Pupils: Pupils are equal, round, and reactive to light.  Cardiovascular:     Rate and Rhythm: Normal rate. Rhythm irregular.     Heart sounds: No murmur heard. Pulmonary:     Effort: Pulmonary effort is normal.     Breath sounds: No rales.  Abdominal:     General: Bowel sounds are normal.     Palpations: Abdomen is soft.     Tenderness: There is no abdominal tenderness.     Hernia: A hernia is present.     Comments: Incision hernia  Musculoskeletal:        General: Tenderness present.     Cervical back: Normal range of motion and neck supple.     Right lower leg: No edema.     Left lower leg: No edema.     Comments: Right ankle brace, chronic pain. New right leg pain, not able to specify the location, stopped using walker to ambulate, no redness or deformity noted.   Skin:    General: Skin is warm and dry.     Comments: Scattered scratched spots upper back, neck, no apparent rash, the patient stated she scratched because of itching.   Neurological:     General: No focal deficit present.     Mental Status: She is alert. Mental status is at baseline.     Motor: No weakness.     Gait: Gait abnormal.     Comments: Oriented to person, time  Psychiatric:        Mood and Affect: Mood normal.        Thought Content: Thought content normal.     Comments:  Confused her locations.      Labs reviewed: Recent Labs    11/24/21 1023 11/26/21 0420 01/22/22 0925 04/08/22 0755  NA 135 135 138 139  K 3.9 3.7 4.5 4.3  CL 101 104 104 104  CO2 26 23 25  24*  GLUCOSE 122* 109* 125*  --   BUN 22 29* 21 24*  CREATININE 1.20* 1.23* 1.23* 1.2*  CALCIUM 9.5 8.7* 9.3 9.3   Recent Labs    11/24/21 1023 01/22/22 0925  AST 23 26  ALT 16 16  ALKPHOS 67 68  BILITOT 0.6 0.6  PROT 8.5* 7.1  ALBUMIN 4.2 3.5   Recent Labs    11/24/21 1023 11/26/21 0420 01/22/22 0925 04/08/22 0755  WBC 8.2 8.9 7.8 7.3  NEUTROABS 6.6  --  6.3 3,833.00  HGB 13.9  12.7 12.6 12.4  HCT 44.4 40.1 40.6 38  MCV 84.6 84.2 84.1  --   PLT 237 202 191 224   Lab Results  Component Value Date   TSH 3.19 03/27/2022   Lab Results  Component Value Date   HGBA1C 5.7 (H) 11/24/2021   Lab Results  Component Value Date   CHOL 181 11/25/2021   HDL 37 (L) 11/25/2021   LDLCALC 127 (H) 11/25/2021   TRIG 85 11/25/2021   CHOLHDL 4.9 11/25/2021    Significant Diagnostic Results in last 30 days:  No results found.  Assessment/Plan Altered mental status reported the patient has been lethargic, was able to be aroused during my examination, followed simple directions, no new focal weakness noted. Will VS and neuro check q4hr x 24hours, obtain CBC/diff, CMP/eGFR, UA C/S  Right leg pain The patient c/o right leg pain when the patient attempted to stand up, no deformity or redness noted.  The patient stopped using walker after fall 07/15/22 Xray lumbar spine, sacrum, pelvis, R femur, knee, tibia, fibula  Stroke (cerebrum) (HCC) 11/24/21 acute CVA left PCA embolic strokes, no apparent residual symptoms, on Eliquis f/u Neurology  CKD (chronic kidney disease) stage 3, GFR 30-59 ml/min (HCC) Bun/creat 24/1.2 04/08/22  Hypothyroidism  takes Levothyroxine, TSH 3.19 03/27/22  Hyponatremia  Na 139 04/08/22  Syncope and collapse  no further recurrence since admitted to SNF  [facility]  Atrial flutter [facility]) f/u Cardiology, off anticoagulation 2/2 falls in the past, restarted Eliquis after acute CVA 11/24/21  Senile dementia (HCC) SNF [facility] for supportive care, 01/29/22 MMSE 23/30  Slow transit constipation Stable.  Essential hypertension Blood pressure is controlled,  takes Hydralazine   Insomnia off Ambien.   Osteoarthritis  chronic R ankle pain, uses ankle brace,  ambulates with walker slowly,  w/c to go further     Family/ staff Communication: plan of care reviewed with the patient and charge nurse.   Labs/tests ordered: CBC/diff, CMP/eGFR, UA C/S, Xray lumbar spine, sacrum, pelvis, R femur, knee, tibia, fibula  Time spend 35 minutes.

## 2022-07-17 NOTE — Assessment & Plan Note (Signed)
11/24/21 acute CVA left PCA embolic strokes, no apparent residual symptoms, on Eliquis f/u Neurology 

## 2022-07-17 NOTE — Assessment & Plan Note (Signed)
no further recurrence since admitted to SNF [facility] 

## 2022-07-17 NOTE — Assessment & Plan Note (Signed)
off Ambien.  

## 2022-07-17 NOTE — Assessment & Plan Note (Signed)
SNF [facility] for supportive care, 01/29/22 MMSE 23/30 

## 2022-07-17 NOTE — Assessment & Plan Note (Signed)
reported the patient has been lethargic, was able to be aroused during my examination, followed simple directions, no new focal weakness noted. Will VS and neuro check q4hr x 24hours, obtain CBC/diff, CMP/eGFR, UA C/S

## 2022-07-17 NOTE — Assessment & Plan Note (Signed)
Stable

## 2022-07-17 NOTE — Assessment & Plan Note (Signed)
takes Levothyroxine, TSH 3.19 03/27/22 

## 2022-07-17 NOTE — Assessment & Plan Note (Signed)
f/u Cardiology, off anticoagulation 2/2 falls in the past, restarted Eliquis after acute CVA 11/24/21 

## 2022-07-17 NOTE — Assessment & Plan Note (Signed)
Blood pressure is controlled,  takes Hydralazine  

## 2022-07-17 NOTE — Assessment & Plan Note (Signed)
Bun/creat 24/1.2 04/08/22 

## 2022-07-17 NOTE — Assessment & Plan Note (Signed)
Na 139 04/08/22 

## 2022-07-18 ENCOUNTER — Non-Acute Institutional Stay (SKILLED_NURSING_FACILITY): Payer: Medicare Other | Admitting: Nurse Practitioner

## 2022-07-18 ENCOUNTER — Encounter: Payer: Self-pay | Admitting: Nurse Practitioner

## 2022-07-18 DIAGNOSIS — E039 Hypothyroidism, unspecified: Secondary | ICD-10-CM

## 2022-07-18 DIAGNOSIS — I10 Essential (primary) hypertension: Secondary | ICD-10-CM | POA: Diagnosis not present

## 2022-07-18 DIAGNOSIS — R350 Frequency of micturition: Secondary | ICD-10-CM

## 2022-07-18 DIAGNOSIS — R400 Somnolence: Secondary | ICD-10-CM | POA: Diagnosis not present

## 2022-07-18 DIAGNOSIS — E871 Hypo-osmolality and hyponatremia: Secondary | ICD-10-CM

## 2022-07-18 DIAGNOSIS — I4892 Unspecified atrial flutter: Secondary | ICD-10-CM

## 2022-07-18 DIAGNOSIS — I639 Cerebral infarction, unspecified: Secondary | ICD-10-CM | POA: Diagnosis not present

## 2022-07-18 DIAGNOSIS — M79604 Pain in right leg: Secondary | ICD-10-CM

## 2022-07-18 DIAGNOSIS — R55 Syncope and collapse: Secondary | ICD-10-CM

## 2022-07-18 DIAGNOSIS — F0390 Unspecified dementia without behavioral disturbance: Secondary | ICD-10-CM

## 2022-07-18 NOTE — Assessment & Plan Note (Signed)
elevated Bp, denied dizziness, headache, change of vision, chest pain/pressure, palpitation,  or new focal weakness. Will increase Amlodipine to 5mg /2.5mg  po daily, prn Clonidine is available to her. Monitor Bp

## 2022-07-18 NOTE — Assessment & Plan Note (Signed)
Syncope: Orthostasis ?, no further recurrence since admitted to SNF [facility]

## 2022-07-18 NOTE — Assessment & Plan Note (Signed)
Na 135 07/17/22

## 2022-07-18 NOTE — Assessment & Plan Note (Signed)
The patient is more alert than yesterday, appetite remains no change. Dc Melatonin.  07/17/22 Na 135, K 4.7, Bun/creat 25/1.37, wbc 11.6, Hgb 12.9, plt 208, neutrophils 73.5, pending UA C/S

## 2022-07-18 NOTE — Assessment & Plan Note (Signed)
Stable, on Mirabegron

## 2022-07-18 NOTE — Assessment & Plan Note (Signed)
SNF [facility] for supportive care, 01/29/22 MMSE 23/30 

## 2022-07-18 NOTE — Assessment & Plan Note (Signed)
takes Levothyroxine, TSH 3.19 03/27/22 

## 2022-07-18 NOTE — Assessment & Plan Note (Signed)
11/24/21 acute CVA left PCA embolic strokes, no apparent residual symptoms, on Eliquis f/u Neurology 

## 2022-07-18 NOTE — Assessment & Plan Note (Signed)
Heart rate is in control,  A flutter, f/u Cardiology, off anticoagulation 2/2 falls in the past, restarted Eliquis after acute CVA 11/24/21

## 2022-07-18 NOTE — Assessment & Plan Note (Signed)
She stated her R leg pain is better today. 07/17/22 Xray lumbar spine, sacrum, pelvis, R femur, knee, tibia, fibula no acute fx or dislocation.

## 2022-07-18 NOTE — Progress Notes (Signed)
Location:   [facility] Room Number: 81 Place of Service:  SNF (31) Provider: Arna Snipe Vrinda Heckstall NP  Aahan Marques X, NP  Patient Care Team: Porshia Blizzard X, NP as PCP - General (Internal Medicine) [name], MD as PCP - Cardiology (Cardiology) Olivia Mackie, MD as Consulting Physician (Obstetrics and Gynecology) De Blanch, MD as Consulting Physician (Gynecology) Cleda Mccreedy, MD as Consulting Physician (Gynecologic Oncology)  Extended Emergency Contact Information Primary Emergency Contact: [facility] Address: [address]          [place], [place] [address] Darden Amber of [place] Home Phone: [phone] Mobile Phone: [phone] Relation: Daughter  Code Status: DNR Goals of care: Advanced Directive information    07/16/2022   12:20 PM  Advanced Directives  Does Patient Have a Medical Advance Directive? Yes  Type of Estate agent of [place];Living will  Does patient want to make changes to medical advance directive? No - Patient declined  Copy of Healthcare Power of Attorney in Chart? Yes - validated most recent copy scanned in chart (See row information)     Chief Complaint  Patient presents with   Acute Visit    Elevated Bp, sleepiness.     HPI:  Pt is a 89 y.o. female seen today for an acute visit for elevated Bp, denied dizziness, headache, change of vision, chest pain/pressure, palpitation,  or new focal weakness. The patient is more alert than yesterday, appetite remains no change. She stated her R leg pain is better today.   11/24/21 acute CVA left PCA embolic strokes, no apparent residual symptoms, on Eliquis f/u Neurology             CKD, Bun/creat 25/1.37 07/17/22             Hypothyroidism, takes Levothyroxine, TSH 3.19 03/27/22             Hyponatremia, Na 135 07/17/22             Syncope: Orthostasis ?, no further recurrence since admitted to SNF [facility]             A flutter, f/u Cardiology, off  anticoagulation 2/2 falls in the past, restarted Eliquis after acute CVA 11/24/21             Hx of ovarian CA s/p TAH/BSO 03/2011, in remission.             Senile dementia,  SNF [facility] for supportive care, 01/29/22 MMSE 23/30             Constipation takes Colace.              HTN, takes Amlodipine, prn Clonidine,              OP, takes Ca, Vit D             Insomnia, off Ambien, off Melatonin.              Hyperlipidemia, on Atorvastatin, LDL 127 11/25/21             OA, chronic R ankle pain, uses ankle brace,  ambulates with walker slowly,  w/c to go further             Urinary frequency, on Mirabegron    Past Medical History:  Diagnosis Date   Anemia associated with acute blood loss 04/03/2011   Arthritis    knees    Ascites    Blood transfusion    hx of 2011  Complication of anesthesia    Sodium drops per pt    Cystitis    DIARRHEA, ANTIBIOTIC ASSOCIATED 05/31/2009   Qualifier: Diagnosis of  By: Daiva Eves MD, Remi Haggard     GERD (gastroesophageal reflux disease)    H/O hiatal hernia    Hyperlipidemia    Hypertension    Hyponatremia    Neutropenia with fever (HCC) 05/18/2011   OSTEOMYELITIS, CHRONIC, LOWER LEG 04/23/2009   Qualifier: Diagnosis of  By: Ninetta Lights MD, Linus Mako    OSTEOPOROSIS 04/23/2009   Qualifier: Diagnosis of  By: Ninetta Lights MD, Jeffrey     Ovarian cancer (HCC) 01/25/2011   Pneumonia    hx of    Postmenopausal atrophic vaginitis    PROSTHETIC JOINT COMPLICATION 05/28/2009   Qualifier: Diagnosis of  By: Daiva Eves MD, Cornelius     Renal disorder    Decreased kidney function   Staph infection 2010   after knee replacement   Urinary frequency    Vulvitis    Past Surgical History:  Procedure Laterality Date   ABDOMINAL HYSTERECTOMY  04/01/2011   Procedure: HYSTERECTOMY ABDOMINAL;  Surgeon: Jeannette Corpus, MD;  Location: [facility];  Service: Gynecology;  Laterality: N/A;   APPENDECTOMY     JOINT REPLACEMENT     R knee in 2008, 5 operations on L  knee   LAPAROTOMY  04/01/2011   Procedure: EXPLORATORY LAPAROTOMY;  Surgeon: Jeannette Corpus, MD;  Location: [facility];  Service: Gynecology;  Laterality: N/A;   OTHER SURGICAL HISTORY     hx of C section 1966   SALPINGOOPHORECTOMY  04/01/2011   Procedure: SALPINGO OOPHERECTOMY;  Surgeon: Jeannette Corpus, MD;  Location: [facility];  Service: Gynecology;  Laterality: Bilateral;   TONSILLECTOMY      Allergies  Allergen Reactions   Morphine And Codeine Anaphylaxis and Other (See Comments)    Given after knee replacement and code blue occurred   Morphine Sulfate Anaphylaxis   Penicillins Hives, Itching and Other (See Comments)    Syncope, also   Nsaids Other (See Comments)    Because of an abnormal kidney test result   Penicillin V Hives   Shellfish Allergy Nausea And Vomiting and Other (See Comments)    Pt has shellfish allergy only.  Has had IV contrast x 2 and did fine.   Shellfish-Derived Products Nausea And Vomiting    Allergies as of 07/18/2022       Reactions   Morphine And Codeine Anaphylaxis, Other (See Comments)   Given after knee replacement and code blue occurred   Morphine Sulfate Anaphylaxis   Penicillins Hives, Itching, Other (See Comments)   Syncope, also   Nsaids Other (See Comments)   Because of an abnormal kidney test result   Penicillin V Hives   Shellfish Allergy Nausea And Vomiting, Other (See Comments)   Pt has shellfish allergy only.  Has had IV contrast x 2 and did fine.   Shellfish-derived Products Nausea And Vomiting        Medication List        Accurate as of July 18, 2022  1:35 PM. If you have any questions, ask your nurse or doctor.          acetaminophen 325 MG tablet Commonly known as: TYLENOL Take 650 mg by mouth every 4 (four) hours as needed for fever.   acetaminophen 325 MG tablet Commonly known as: TYLENOL Take 650 mg by mouth every 4 (four) hours as needed for moderate  pain.   amLODipine 2.5 MG tablet Commonly known  as: NORVASC Take 2.5 mg by mouth at bedtime.   apixaban 5 MG Tabs tablet Commonly known as: ELIQUIS Take 1 tablet (5 mg total) by mouth 2 (two) times daily.   atorvastatin 40 MG tablet Commonly known as: LIPITOR Take 1 tablet (40 mg total) by mouth daily.   B-COMPLEX/FOLIC ACID/VITAMIN C PO Take 1 tablet by mouth daily with breakfast.   bimatoprost 0.03 % ophthalmic solution Commonly known as: LUMIGAN Place 1 drop into both eyes at bedtime.   cloNIDine 0.1 MG tablet Commonly known as: CATAPRES Take 0.1 mg by mouth daily.   diclofenac Sodium 1 % Gel Commonly known as: VOLTAREN Apply 2 g topically in the morning and at bedtime.   docusate sodium 100 MG capsule Commonly known as: COLACE Take 100 mg by mouth daily.   levothyroxine 25 MCG tablet Commonly known as: SYNTHROID Take 25 mcg by mouth daily before breakfast.   melatonin 3 MG Tabs tablet Take 1 tablet (3 mg total) by mouth at bedtime.   miconazole 2 % powder Commonly known as: MICOTIN Apply topically daily. Patient may apply to dry skin under breasts daily and after showering   Myrbetriq 25 MG Tb24 tablet Generic drug: mirabegron ER Take 25 mg by mouth daily.   nystatin powder Commonly known as: MYCOSTATIN/NYSTOP Apply 1 application topically 2 (two) times daily as needed.   pantoprazole 40 MG tablet Commonly known as: PROTONIX Take 40 mg by mouth daily.   polyvinyl alcohol 1.4 % ophthalmic solution Commonly known as: LIQUIFILM TEARS Place 1 drop into both eyes in the morning, at noon, in the evening, and at bedtime.   sodium chloride 1 g tablet Take 1 g by mouth 2 (two) times daily.   Vitamin D3 50 MCG (2000 UT) Tabs Take 2,000 Units by mouth in the morning.        Review of Systems  Constitutional:  Negative for appetite change and fever.  HENT:  Positive for hearing loss. Negative for congestion and trouble swallowing.   Eyes:  Negative for visual disturbance.  Respiratory:  Negative for  shortness of breath.   Cardiovascular:  Negative for leg swelling.  Gastrointestinal:  Negative for abdominal pain and constipation.  Genitourinary:  Positive for frequency. Negative for difficulty urinating, dysuria, flank pain and urgency.  Musculoskeletal:  Positive for arthralgias, back pain and gait problem.       R ankle pain is chronic, R leg pain is new.   Skin:  Negative for color change.  Neurological:  Negative for speech difficulty, weakness and light-headedness.       Memory deficit  Psychiatric/Behavioral:  Positive for confusion. Negative for sleep disturbance. The patient is not nervous/anxious.     Immunization History  Administered Date(s) Administered   Influenza Split 11/13/2012, 12/13/2012, 11/30/2013, 10/09/2014, 11/24/2018   Influenza, High Dose Seasonal PF 12/01/2013, 10/04/2014, 10/12/2015, 11/06/2016, 11/12/2017   Influenza,inj,Quad PF,6+ Mos 10/24/2010   Influenza-Unspecified 10/24/2010, 11/01/2019, 12/04/2020   Moderna Covid-19 Vaccine Bivalent Booster 38yrs & up 07/24/2021   Moderna SARS-COV2 Booster Vaccination 07/18/2020   Moderna Sars-Covid-2 Vaccination 02/14/2019, 03/14/2019, 12/26/2019   PFIZER(Purple Top)SARS-COV-2 Vaccination 10/31/2020   PPD Test 11/27/2021   Pfizer Covid-19 Vaccine Bivalent Booster 66yrs & up 10/31/2020   Pneumococcal Conjugate-13 09/28/2013   Pneumococcal Polysaccharide-23 03/05/2012   Td 10/16/2010   Td (Adult) 08/27/2000   Tdap 08/27/2000, 10/16/2010   Zoster, Live 01/11/2013, 03/31/2020   Pertinent  Health Maintenance Due  Topic Date Due   INFLUENZA VACCINE  09/11/2022   DEXA SCAN  Completed      02/12/2022    4:30 PM 03/25/2022   11:07 AM 04/03/2022    3:37 PM 04/18/2022    9:19 AM 07/04/2022   10:17 AM  Fall Risk  Falls in the past year? 0 0 0 0 1  Was there an injury with Fall? 0 0 0 0 0  Fall Risk Category Calculator 0 0 0 0 2  Fall Risk Category (Retired) Low      (RETIRED) Patient Fall Risk Level High fall risk       Patient at Risk for Falls Due to History of fall(s);Impaired balance/gait;Impaired mobility;Mental status change History of fall(s);Impaired balance/gait;Impaired mobility;Mental status change History of fall(s);Impaired balance/gait;Impaired mobility;Mental status change History of fall(s);Impaired balance/gait;Impaired mobility;Mental status change History of fall(s);Impaired balance/gait;Impaired mobility;Mental status change  Fall risk Follow up Falls evaluation completed Falls evaluation completed Falls evaluation completed Falls evaluation completed Falls evaluation completed   Functional Status Survey:    Vitals:   07/18/22 1318 07/18/22 1319  BP: (!) 182/80 (!) 190/83   There is no height or weight on file to calculate BMI. Physical Exam Vitals and nursing note reviewed.  Constitutional:      Appearance: Normal appearance.  HENT:     Head: Normocephalic and atraumatic.     Mouth/Throat:     Mouth: Mucous membranes are moist.  Eyes:     Extraocular Movements: Extraocular movements intact.     Conjunctiva/sclera: Conjunctivae normal.     Pupils: Pupils are equal, round, and reactive to light.  Cardiovascular:     Rate and Rhythm: Normal rate. Rhythm irregular.     Heart sounds: No murmur heard. Pulmonary:     Effort: Pulmonary effort is normal.     Breath sounds: No rales.  Abdominal:     General: Bowel sounds are normal.     Palpations: Abdomen is soft.     Tenderness: There is no abdominal tenderness.     Hernia: A hernia is present.     Comments: Incision hernia  Musculoskeletal:        General: Tenderness present.     Cervical back: Normal range of motion and neck supple.     Right lower leg: No edema.     Left lower leg: No edema.     Comments: Right ankle brace, chronic pain. New right leg pain, not able to specify the location, stopped using walker to ambulate, no redness or deformity noted.   Skin:    General: Skin is warm and dry.     Comments:  Scattered scratched spots upper back, neck, no apparent rash, the patient stated she scratched because of itching.   Neurological:     General: No focal deficit present.     Mental Status: She is alert. Mental status is at baseline.     Motor: No weakness.     Gait: Gait abnormal.     Comments: Oriented to person, time  Psychiatric:        Mood and Affect: Mood normal.        Thought Content: Thought content normal.     Comments: Confused her locations.      Labs reviewed: Recent Labs    11/24/21 1023 11/26/21 0420 01/22/22 0925 04/08/22 0755  NA 135 135 138 139  K 3.9 3.7 4.5 4.3  CL 101 104 104 104  CO2 26 23 25  24*  GLUCOSE 122*  109* 125*  --   BUN 22 29* 21 24*  CREATININE 1.20* 1.23* 1.23* 1.2*  CALCIUM 9.5 8.7* 9.3 9.3   Recent Labs    11/24/21 1023 01/22/22 0925  AST 23 26  ALT 16 16  ALKPHOS 67 68  BILITOT 0.6 0.6  PROT 8.5* 7.1  ALBUMIN 4.2 3.5   Recent Labs    11/24/21 1023 11/26/21 0420 01/22/22 0925 04/08/22 0755  WBC 8.2 8.9 7.8 7.3  NEUTROABS 6.6  --  6.3 3,833.00  HGB 13.9 12.7 12.6 12.4  HCT 44.4 40.1 40.6 38  MCV 84.6 84.2 84.1  --   PLT 237 202 191 224   Lab Results  Component Value Date   TSH 3.19 03/27/2022   Lab Results  Component Value Date   HGBA1C 5.7 (H) 11/24/2021   Lab Results  Component Value Date   CHOL 181 11/25/2021   HDL 37 (L) 11/25/2021   LDLCALC 127 (H) 11/25/2021   TRIG 85 11/25/2021   CHOLHDL 4.9 11/25/2021    Significant Diagnostic Results in last 30 days:  No results found.  Assessment/Plan: Essential hypertension elevated Bp, denied dizziness, headache, change of vision, chest pain/pressure, palpitation,  or new focal weakness. Will increase Amlodipine to 5mg /2.5mg  po daily, prn Clonidine is available to her. Monitor Bp  Altered mental status The patient is more alert than yesterday, appetite remains no change. Dc Melatonin.  07/17/22 Na 135, K 4.7, Bun/creat 25/1.37, wbc 11.6, Hgb 12.9, plt 208,  neutrophils 73.5, pending UA C/S  Right leg pain She stated her R leg pain is better today. 07/17/22 Xray lumbar spine, sacrum, pelvis, R femur, knee, tibia, fibula no acute fx or dislocation.   Stroke (cerebrum) (HCC) 11/24/21 acute CVA left PCA embolic strokes, no apparent residual symptoms, on Eliquis f/u Neurology  Hypothyroidism  takes Levothyroxine, TSH 3.19 03/27/22  Hyponatremia Na 135 07/17/22  Syncope Syncope: Orthostasis ?, no further recurrence since admitted to SNF [facility]  Atrial flutter (HCC) Heart rate is in control,  A flutter, f/u Cardiology, off anticoagulation 2/2 falls in the past, restarted Eliquis after acute CVA 11/24/21  Senile dementia [facility]  SNF [facility] for supportive care, 01/29/22 MMSE 23/30    Family/ staff Communication: plan of care reviewed with the patient and charge nurse.   Labs/tests ordered:  none  Time spend 35 minutes.

## 2022-07-22 NOTE — Progress Notes (Incomplete)
Location:  [facility] Room Number: N043-A Place of Service:  SNF (31) Provider:  Kenard Gower, DNP, FNP-BC  Patient Care Team: Mast, Man X, NP as PCP - General (Internal Medicine) [name], MD as PCP - Cardiology (Cardiology) Olivia Mackie, MD as Consulting Physician (Obstetrics and Gynecology) De Blanch, MD as Consulting Physician (Gynecology) Cleda Mccreedy, MD as Consulting Physician (Gynecologic Oncology)  Extended Emergency Contact Information Primary Emergency Contact: [facility] Address: [address]          [place], [place] [address] Darden Amber of [place] Home Phone: [phone] Mobile Phone: [phone] Relation: Daughter  Code Status:  Full Code  Goals of care: Advanced Directive information    07/16/2022   12:20 PM  Advanced Directives  Does Patient Have a Medical Advance Directive? Yes  Type of Estate agent of [place];Living will  Does patient want to make changes to medical advance directive? No - Patient declined  Copy of Healthcare Power of Attorney in Chart? Yes - validated most recent copy scanned in chart (See row information)     Chief Complaint  Patient presents with  . Acute Visit    Right knee pain    HPI:  Pt is a 89 y.o. female seen today for right knee pain. She is a long-term care resident of [facility]  Past Medical History:  Diagnosis Date  . Anemia associated with acute blood loss 04/03/2011  . Arthritis    knees   . Ascites   . Blood transfusion    hx of 2011   . Complication of anesthesia    Sodium drops per pt   . Cystitis   . DIARRHEA, ANTIBIOTIC ASSOCIATED 05/31/2009   Qualifier: Diagnosis of  By: Daiva Eves MD, Remi Haggard    . GERD (gastroesophageal reflux disease)   . H/O hiatal hernia   . Hyperlipidemia   . Hypertension   . Hyponatremia   . Neutropenia with fever (HCC) 05/18/2011  . OSTEOMYELITIS, CHRONIC, LOWER LEG  04/23/2009   Qualifier: Diagnosis of  By: Ninetta Lights MD, Tinnie Gens    . Osteopenia   . OSTEOPOROSIS 04/23/2009   Qualifier: Diagnosis of  By: Ninetta Lights MD, Tinnie Gens    . Ovarian cancer (HCC) 01/25/2011  . Pneumonia    hx of   . Postmenopausal atrophic vaginitis   . PROSTHETIC JOINT COMPLICATION 05/28/2009   Qualifier: Diagnosis of  By: Daiva Eves MD, Remi Haggard    . Renal disorder    Decreased kidney function  . Staph infection 2010   after knee replacement  . Urinary frequency   . Vulvitis    Past Surgical History:  Procedure Laterality Date  . ABDOMINAL HYSTERECTOMY  04/01/2011   Procedure: HYSTERECTOMY ABDOMINAL;  Surgeon: Jeannette Corpus, MD;  Location: [facility];  Service: Gynecology;  Laterality: N/A;  . APPENDECTOMY    . JOINT REPLACEMENT     R knee in 2008, 5 operations on L knee  . LAPAROTOMY  04/01/2011   Procedure: EXPLORATORY LAPAROTOMY;  Surgeon: Jeannette Corpus, MD;  Location: [facility];  Service: Gynecology;  Laterality: N/A;  . OTHER SURGICAL HISTORY     hx of C section 1966  . SALPINGOOPHORECTOMY  04/01/2011   Procedure: SALPINGO OOPHERECTOMY;  Surgeon: Jeannette Corpus, MD;  Location: [facility];  Service: Gynecology;  Laterality: Bilateral;  . TONSILLECTOMY      Allergies  Allergen Reactions  . Morphine And Codeine Anaphylaxis and Other (See Comments)    Given  after knee replacement and code blue occurred  . Morphine Sulfate Anaphylaxis  . Penicillins Hives, Itching and Other (See Comments)    Syncope, also  . Nsaids Other (See Comments)    Because of an abnormal kidney test result  . Penicillin V Hives  . Shellfish Allergy Nausea And Vomiting and Other (See Comments)    Pt has shellfish allergy only.  Has had IV contrast x 2 and did fine.  Marland Kitchen Shellfish-Derived Products Nausea And Vomiting    Outpatient Encounter Medications as of 07/16/2022  Medication Sig  . acetaminophen (TYLENOL) 325 MG tablet Take 650 mg by mouth every 4 (four) hours as needed for  fever.  Marland Kitchen acetaminophen (TYLENOL) 325 MG tablet Take 650 mg by mouth every 4 (four) hours as needed for moderate pain.  Marland Kitchen amLODipine (NORVASC) 2.5 MG tablet Take 2.5 mg by mouth at bedtime.  Marland Kitchen apixaban (ELIQUIS) 5 MG TABS tablet Take 1 tablet (5 mg total) by mouth 2 (two) times daily.  Marland Kitchen atorvastatin (LIPITOR) 40 MG tablet Take 1 tablet (40 mg total) by mouth daily.  . B Complex-C-Folic Acid (B-COMPLEX/FOLIC ACID/VITAMIN C PO) Take 1 tablet by mouth daily with breakfast.  . bimatoprost (LUMIGAN) 0.03 % ophthalmic solution Place 1 drop into both eyes at bedtime.  . Cholecalciferol (VITAMIN D3) 50 MCG (2000 UT) TABS Take 2,000 Units by mouth in the morning.  . cloNIDine (CATAPRES) 0.1 MG tablet Take 0.1 mg by mouth daily.  . diclofenac Sodium (VOLTAREN) 1 % GEL Apply 2 g topically in the morning and at bedtime.  . docusate sodium (COLACE) 100 MG capsule Take 100 mg by mouth daily.  Marland Kitchen levothyroxine (SYNTHROID) 25 MCG tablet Take 25 mcg by mouth daily before breakfast.  . melatonin 3 MG TABS tablet Take 1 tablet (3 mg total) by mouth at bedtime.  . miconazole (MICOTIN) 2 % powder Apply topically daily. Patient may apply to dry skin under breasts daily and after showering  . mirabegron ER (MYRBETRIQ) 25 MG TB24 tablet Take 25 mg by mouth daily.  Marland Kitchen nystatin (MYCOSTATIN/NYSTOP) powder Apply 1 application topically 2 (two) times daily as needed.  . pantoprazole (PROTONIX) 40 MG tablet Take 40 mg by mouth daily.  . polyvinyl alcohol (LIQUIFILM TEARS) 1.4 % ophthalmic solution Place 1 drop into both eyes in the morning, at noon, in the evening, and at bedtime.  . sodium chloride 1 g tablet Take 1 g by mouth 2 (two) times daily.  . [DISCONTINUED] hydrocortisone cream 1 % Apply 1 Application topically 2 (two) times daily.  . [DISCONTINUED] loratadine (CLARITIN) 10 MG tablet Take 10 mg by mouth daily.   No facility-administered encounter medications on file as of 07/16/2022.    Review of Systems  ***    Immunization History  Administered Date(s) Administered  . Influenza Split 11/13/2012, 12/13/2012, 11/30/2013, 10/09/2014, 11/24/2018  . Influenza, High Dose Seasonal PF 12/01/2013, 10/04/2014, 10/12/2015, 11/06/2016, 11/12/2017  . Influenza,inj,Quad PF,6+ Mos 10/24/2010  . Influenza-Unspecified 10/24/2010, 11/01/2019, 12/04/2020  . Moderna Covid-19 Vaccine Bivalent Booster 19yrs & up 07/24/2021  . Moderna SARS-COV2 Booster Vaccination 07/18/2020  . Moderna Sars-Covid-2 Vaccination 02/14/2019, 03/14/2019, 12/26/2019  . PFIZER(Purple Top)SARS-COV-2 Vaccination 10/31/2020  . PPD Test 11/27/2021  . Research officer, trade [employer] 48yrs & up 10/31/2020  . Pneumococcal Conjugate-13 09/28/2013  . Pneumococcal Polysaccharide-23 03/05/2012  . Td 10/16/2010  . Td (Adult) 08/27/2000  . Tdap 08/27/2000, 10/16/2010  . Zoster, Live 01/11/2013, 03/31/2020   Pertinent  Health Maintenance Due  Topic  Date Due  . INFLUENZA VACCINE  09/11/2022  . DEXA SCAN  Completed      02/12/2022    4:30 PM 03/25/2022   11:07 AM 04/03/2022    3:37 PM 04/18/2022    9:19 AM 07/04/2022   10:17 AM  Fall Risk  Falls in the past year? 0 0 0 0 1  Was there an injury with Fall? 0 0 0 0 0  Fall Risk Category Calculator 0 0 0 0 2  Fall Risk Category (Retired) Low      (RETIRED) Patient Fall Risk Level High fall risk      Patient at Risk for Falls Due to History of fall(s);Impaired balance/gait;Impaired mobility;Mental status change History of fall(s);Impaired balance/gait;Impaired mobility;Mental status change History of fall(s);Impaired balance/gait;Impaired mobility;Mental status change History of fall(s);Impaired balance/gait;Impaired mobility;Mental status change History of fall(s);Impaired balance/gait;Impaired mobility;Mental status change  Fall risk Follow up Falls evaluation completed Falls evaluation completed Falls evaluation completed Falls evaluation completed Falls evaluation completed      Vitals:   07/16/22 1227  BP: 130/64  Pulse: 82  Resp: 16  Temp: (!) 97.1 F (36.2 C)  SpO2: 95%  Weight: 187 lb 3.2 oz (84.9 kg)  Height: 5\' 3"  (1.6 m)   Body mass index is 33.16 kg/m.  Physical Exam     Labs reviewed: Recent Labs    11/24/21 1023 11/26/21 0420 01/22/22 0925 04/08/22 0755  NA 135 135 138 139  K 3.9 3.7 4.5 4.3  CL 101 104 104 104  CO2 26 23 25  24*  GLUCOSE 122* 109* 125*  --   BUN 22 29* 21 24*  CREATININE 1.20* 1.23* 1.23* 1.2*  CALCIUM 9.5 8.7* 9.3 9.3   Recent Labs    11/24/21 1023 01/22/22 0925  AST 23 26  ALT 16 16  ALKPHOS 67 68  BILITOT 0.6 0.6  PROT 8.5* 7.1  ALBUMIN 4.2 3.5   Recent Labs    11/24/21 1023 11/26/21 0420 01/22/22 0925 04/08/22 0755  WBC 8.2 8.9 7.8 7.3  NEUTROABS 6.6  --  6.3 3,833.00  HGB 13.9 12.7 12.6 12.4  HCT 44.4 40.1 40.6 38  MCV 84.6 84.2 84.1  --   PLT 237 202 191 224   Lab Results  Component Value Date   TSH 3.19 03/27/2022   Lab Results  Component Value Date   HGBA1C 5.7 (H) 11/24/2021   Lab Results  Component Value Date   CHOL 181 11/25/2021   HDL 37 (L) 11/25/2021   LDLCALC 127 (H) 11/25/2021   TRIG 85 11/25/2021   CHOLHDL 4.9 11/25/2021    Significant Diagnostic Results in last 30 days:  No results found.  Assessment/Plan ***   Family/ staff Communication: Discussed plan of care with resident and charge nurse  Labs/tests ordered:     Kenard Gower, DNP, MSN, FNP-BC [facility] [phone] (Monday-Friday 8:00 a.m. - 5:00 p.m.) [phone] (after hours)

## 2022-08-12 ENCOUNTER — Non-Acute Institutional Stay (SKILLED_NURSING_FACILITY): Payer: Medicare Other | Admitting: Family Medicine

## 2022-08-12 DIAGNOSIS — I4892 Unspecified atrial flutter: Secondary | ICD-10-CM | POA: Diagnosis not present

## 2022-08-12 DIAGNOSIS — R4189 Other symptoms and signs involving cognitive functions and awareness: Secondary | ICD-10-CM | POA: Diagnosis not present

## 2022-08-12 DIAGNOSIS — E785 Hyperlipidemia, unspecified: Secondary | ICD-10-CM | POA: Diagnosis not present

## 2022-08-12 DIAGNOSIS — I700 Atherosclerosis of aorta: Secondary | ICD-10-CM

## 2022-08-12 NOTE — Progress Notes (Signed)
Provider:  Jacalyn Lefevre, MD Location:      Place of Service:     PCP: Mast, Man X, NP Patient Care Team: Mast, Man X, NP as PCP - General (Internal Medicine) [name], MD as PCP - Cardiology (Cardiology) Olivia Mackie, MD as Consulting Physician (Obstetrics and Gynecology) De Blanch, MD as Consulting Physician (Gynecology) Cleda Mccreedy, MD as Consulting Physician (Gynecologic Oncology)  Extended Emergency Contact Information Primary Emergency Contact: [facility] Address: [address]          [place], [place] [address] [place] of [place] Home Phone: [phone] Mobile Phone: [phone] Relation: Daughter  Code Status:  Goals of Care: Advanced Directive information    07/16/2022   12:20 PM  Advanced Directives  Does Patient Have a Medical Advance Directive? Yes  Type of Estate agent of [place];Living will  Does patient want to make changes to medical advance directive? No - Patient declined  Copy of Healthcare Power of Attorney in Chart? Yes - validated most recent copy scanned in chart (See row information)      No chief complaint on file.   HPI: Patient is a 89 y.o. female seen today for medical management of chronic problems including: Atrial flutter, hypertension, senile dementia, hyperlipidemia and osteoarthritis. Patient was admitted to skilled care here after she had had symptoms of acute CVA.  She was restarted on Eliquis after those symptoms. She has done well.  She moves around facility in both a traditional wheelchair as well as a motorized version.  Recently has had some pain in her right knee and hip x-rays proved negative.  There may have been some minor trauma to the area.  Blood pressures have been stable on amlodipine.  There was also some issues with somnolence she was taken off Ambien and melatonin Past Medical History:  Diagnosis Date   Anemia associated with acute blood loss 04/03/2011    Arthritis    knees    Ascites    Blood transfusion    hx of 2011    Complication of anesthesia    Sodium drops per pt    Cystitis    DIARRHEA, ANTIBIOTIC ASSOCIATED 05/31/2009   Qualifier: Diagnosis of  By: Daiva Eves MD, Cornelius     GERD (gastroesophageal reflux disease)    H/O hiatal hernia    Hyperlipidemia    Hypertension    Hyponatremia    Neutropenia with fever (HCC) 05/18/2011   OSTEOMYELITIS, CHRONIC, LOWER LEG 04/23/2009   Qualifier: Diagnosis of  By: Ninetta Lights MD, Linus Mako    OSTEOPOROSIS 04/23/2009   Qualifier: Diagnosis of  By: Ninetta Lights MD, Jeffrey     Ovarian cancer (HCC) 01/25/2011   Pneumonia    hx of    Postmenopausal atrophic vaginitis    PROSTHETIC JOINT COMPLICATION 05/28/2009   Qualifier: Diagnosis of  By: Daiva Eves MD, Cornelius     Renal disorder    Decreased kidney function   Staph infection 2010   after knee replacement   Urinary frequency    Vulvitis    Past Surgical History:  Procedure Laterality Date   ABDOMINAL HYSTERECTOMY  04/01/2011   Procedure: HYSTERECTOMY ABDOMINAL;  Surgeon: Jeannette Corpus, MD;  Location: [facility];  Service: Gynecology;  Laterality: N/A;   APPENDECTOMY     JOINT REPLACEMENT     R knee in 2008, 5 operations on L knee   LAPAROTOMY  04/01/2011   Procedure: EXPLORATORY LAPAROTOMY;  Surgeon: Jeannette Corpus, MD;  Location: [facility];  Service: Gynecology;  Laterality: N/A;   OTHER SURGICAL HISTORY     hx of C section 1966   SALPINGOOPHORECTOMY  04/01/2011   Procedure: SALPINGO OOPHERECTOMY;  Surgeon: Jeannette Corpus, MD;  Location: [facility];  Service: Gynecology;  Laterality: Bilateral;   TONSILLECTOMY      reports that she quit smoking about 70 years ago. Her smoking use included cigarettes. She has never used smokeless tobacco. She reports that she does not currently use alcohol. She reports that she does not use drugs. Social History   Socioeconomic History   Marital status: Widowed    Spouse  name: Not on file   Number of children: Not on file   Years of education: Not on file   Highest education level: Not on file  Occupational History   Not on file  Tobacco Use   Smoking status: Former    Types: Cigarettes    Quit date: 02/11/1952    Years since quitting: 70.5   Smokeless tobacco: Never  Vaping Use   Vaping Use: Never used  Substance and Sexual Activity   Alcohol use: Not Currently   Drug use: Never   Sexual activity: Never  Other Topics Concern   Not on file  Social History Narrative   Not on file   Social Determinants of Health   Financial Resource Strain: Low Risk  (08/23/2021)   Overall Financial Resource Strain (CARDIA)    Difficulty of Paying Living Expenses: Not hard at all  Food Insecurity: No Food Insecurity (11/26/2021)   Hunger Vital Sign    Worried About Running Out of Food in the Last Year: Never true    Ran Out of Food in the Last Year: Never true  Transportation Needs: No Transportation Needs (11/26/2021)   PRAPARE - Administrator, Civil Service (Medical): No    Lack of Transportation (Non-Medical): No  Physical Activity: Insufficiently Active (08/23/2021)   Exercise Vital Sign    Days of Exercise per Week: 7 days    Minutes of Exercise per Session: 10 min  Stress: No Stress Concern Present (08/16/2020)   [employer] of Occupational Health - Occupational Stress Questionnaire    Feeling of Stress : Only a little  Social Connections: Socially Isolated (08/23/2021)   Social Connection and Isolation Panel [NHANES]    Frequency of Communication with Friends and Family: More than three times a week    Frequency of Social Gatherings with Friends and Family: Three times a week    Attends Religious Services: Never    Active Member of Clubs or Organizations: No    Attends Banker Meetings: Never    Marital Status: Widowed  Intimate Partner Violence: Not At Risk (11/26/2021)   Humiliation, Afraid, Rape, and Kick  questionnaire    Fear of Current or Ex-Partner: No    Emotionally Abused: No    Physically Abused: No    Sexually Abused: No    Functional Status Survey:    Family History  Problem Relation Age of Onset   Cancer Other        Bladder cancer   Heart attack Brother    Diabetes Brother     Health Maintenance  Topic Date Due   DTaP/Tdap/Td (5 - Td or Tdap) 10/15/2020   COVID-19 Vaccine (6 - 2023-24 season) 10/11/2021   Medicare Annual Wellness (AWV)  08/24/2022   INFLUENZA VACCINE  09/11/2022   Pneumonia Vaccine 38+ Years old  Completed  DEXA SCAN  Completed   HPV VACCINES  Aged Out   Zoster Vaccines- Shingrix  Discontinued    Allergies  Allergen Reactions   Morphine And Codeine Anaphylaxis and Other (See Comments)    Given after knee replacement and code blue occurred   Morphine Sulfate Anaphylaxis   Penicillins Hives, Itching and Other (See Comments)    Syncope, also   Nsaids Other (See Comments)    Because of an abnormal kidney test result   Penicillin V Hives   Shellfish Allergy Nausea And Vomiting and Other (See Comments)    Pt has shellfish allergy only.  Has had IV contrast x 2 and did fine.   Shellfish-Derived Products Nausea And Vomiting    Outpatient Encounter Medications as of 08/12/2022  Medication Sig   acetaminophen (TYLENOL) 325 MG tablet Take 650 mg by mouth every 4 (four) hours as needed for fever.   acetaminophen (TYLENOL) 325 MG tablet Take 650 mg by mouth every 4 (four) hours as needed for moderate pain.   amLODipine (NORVASC) 2.5 MG tablet Take 2.5 mg by mouth at bedtime.   apixaban (ELIQUIS) 5 MG TABS tablet Take 1 tablet (5 mg total) by mouth 2 (two) times daily.   atorvastatin (LIPITOR) 40 MG tablet Take 1 tablet (40 mg total) by mouth daily.   B Complex-C-Folic Acid (B-COMPLEX/FOLIC ACID/VITAMIN C PO) Take 1 tablet by mouth daily with breakfast.   bimatoprost (LUMIGAN) 0.03 % ophthalmic solution Place 1 drop into both eyes at bedtime.    Cholecalciferol (VITAMIN D3) 50 MCG (2000 UT) TABS Take 2,000 Units by mouth in the morning.   cloNIDine (CATAPRES) 0.1 MG tablet Take 0.1 mg by mouth daily.   diclofenac Sodium (VOLTAREN) 1 % GEL Apply 2 g topically in the morning and at bedtime.   docusate sodium (COLACE) 100 MG capsule Take 100 mg by mouth daily.   levothyroxine (SYNTHROID) 25 MCG tablet Take 25 mcg by mouth daily before breakfast.   melatonin 3 MG TABS tablet Take 1 tablet (3 mg total) by mouth at bedtime.   miconazole (MICOTIN) 2 % powder Apply topically daily. Patient may apply to dry skin under breasts daily and after showering   mirabegron ER (MYRBETRIQ) 25 MG TB24 tablet Take 25 mg by mouth daily.   nystatin (MYCOSTATIN/NYSTOP) powder Apply 1 application topically 2 (two) times daily as needed.   pantoprazole (PROTONIX) 40 MG tablet Take 40 mg by mouth daily.   polyvinyl alcohol (LIQUIFILM TEARS) 1.4 % ophthalmic solution Place 1 drop into both eyes in the morning, at noon, in the evening, and at bedtime.   sodium chloride 1 g tablet Take 1 g by mouth 2 (two) times daily.   No facility-administered encounter medications on file as of 08/12/2022.    Review of Systems  Constitutional: Negative.   Respiratory: Negative.    Cardiovascular: Negative.   Gastrointestinal: Negative.   Genitourinary:  Positive for frequency.  Musculoskeletal:  Positive for gait problem.  Hematological: Negative.   Psychiatric/Behavioral:  Positive for confusion.   All other systems reviewed and are negative.   There were no vitals filed for this visit. There is no height or weight on file to calculate BMI. Physical Exam Vitals and nursing note reviewed.  Constitutional:      Appearance: Normal appearance.  Cardiovascular:     Rate and Rhythm: Normal rate and regular rhythm.  Pulmonary:     Effort: Pulmonary effort is normal.     Breath sounds: Normal breath sounds.  Abdominal:     General: Bowel sounds are normal.   Neurological:     General: No focal deficit present.     Mental Status: She is alert.  Psychiatric:        Mood and Affect: Mood normal.        Behavior: Behavior normal.     Labs reviewed: Basic Metabolic Panel: Recent Labs    11/24/21 1023 11/26/21 0420 01/22/22 0925 04/08/22 0755  NA 135 135 138 139  K 3.9 3.7 4.5 4.3  CL 101 104 104 104  CO2 26 23 25  24*  GLUCOSE 122* 109* 125*  --   BUN 22 29* 21 24*  CREATININE 1.20* 1.23* 1.23* 1.2*  CALCIUM 9.5 8.7* 9.3 9.3   Liver Function Tests: Recent Labs    11/24/21 1023 01/22/22 0925  AST 23 26  ALT 16 16  ALKPHOS 67 68  BILITOT 0.6 0.6  PROT 8.5* 7.1  ALBUMIN 4.2 3.5   No results for input(s): "LIPASE", "AMYLASE" in the last 8760 hours. No results for input(s): "AMMONIA" in the last 8760 hours. CBC: Recent Labs    11/24/21 1023 11/26/21 0420 01/22/22 0925 04/08/22 0755  WBC 8.2 8.9 7.8 7.3  NEUTROABS 6.6  --  6.3 3,833.00  HGB 13.9 12.7 12.6 12.4  HCT 44.4 40.1 40.6 38  MCV 84.6 84.2 84.1  --   PLT 237 202 191 224   Cardiac Enzymes: No results for input(s): "CKTOTAL", "CKMB", "CKMBINDEX", "TROPONINI" in the last 8760 hours. BNP: Invalid input(s): "POCBNP" Lab Results  Component Value Date   HGBA1C 5.7 (H) 11/24/2021   Lab Results  Component Value Date   TSH 3.19 03/27/2022   No results found for: "VITAMINB12" No results found for: "FOLATE" Lab Results  Component Value Date   IRON 74 05/27/2011   TIBC 299 05/27/2011   FERRITIN 498 (H) 05/27/2011    Imaging and Procedures obtained prior to SNF admission: MR BRAIN WO CONTRAST  Result Date: 01/22/2022 CLINICAL DATA:  Acute neuro deficit.  Rule out stroke EXAM: MRI HEAD WITHOUT CONTRAST TECHNIQUE: Multiplanar, multiecho pulse sequences of the brain and surrounding structures were obtained without intravenous contrast. COMPARISON:  CT head 01/22/2022.  MRI head 11/24/2021 FINDINGS: Brain: Negative for acute infarct Moderate white matter changes  with confluent and diffuse T2 and FLAIR hyperintensity throughout the white matter. Chronic lacunar infarcts are present in the basal ganglia bilaterally. Mild chronic ischemic change in the pons. Chronic small infarct right cerebellum. Small chronic cortical infarct in the left posterior parietal lobe with laminar necrosis. This area shows restricted diffusion on the MRI of 11/24/2021. Ventricle size normal. Generalized atrophy. Vascular: Normal arterial flow voids. Skull and upper cervical spine: No focal abnormality. Sinuses/Orbits: Mild mucosal edema paranasal sinuses. Left cataract extraction. No orbital mass Other: None IMPRESSION: 1. Negative for acute infarct. 2. Moderate atrophy.  Moderate chronic ischemic changes. Electronically Signed   By: Marlan Palau M.D.   On: 01/22/2022 11:55   CT Head Wo Contrast  Result Date: 01/22/2022 CLINICAL DATA:  Delirium.  Altered mental status EXAM: CT HEAD WITHOUT CONTRAST TECHNIQUE: Contiguous axial images were obtained from the base of the skull through the vertex without intravenous contrast. RADIATION DOSE REDUCTION: This exam was performed according to the departmental dose-optimization program which includes automated exposure control, adjustment of the mA and/or kV according to patient size and/or use of iterative reconstruction technique. COMPARISON:  None Available. FINDINGS: Brain: No acute intracranial hemorrhage. No focal mass lesion. No CT  evidence of acute infarction. No midline shift or mass effect. No hydrocephalus. Basilar cisterns are patent. There are periventricular and subcortical white matter hypodensities. Generalized cortical atrophy. Vascular: No hyperdense vessel or unexpected calcification. Skull: Normal. Negative for fracture or focal lesion. Sinuses/Orbits: Paranasal sinuses and mastoid air cells are clear. Orbits are clear. Other: None. IMPRESSION: 1. No acute intracranial findings. 2. Atrophy and white matter microvascular disease  Electronically Signed   By: Genevive Bi M.D.   On: 01/22/2022 07:55   DG Chest Portable 1 View  Result Date: 01/22/2022 CLINICAL DATA:  Altered mental status. EXAM: PORTABLE CHEST 1 VIEW COMPARISON:  Portable chest 04/18/2020 FINDINGS: There is patchy opacity in the left lower lung field concerning for consolidated pneumonia, possibly of the lingula as there is silhouetting of the lower left heart border. The remainder of the lungs are clear. The heart is slightly enlarged and no vascular congestion is seen. There is moderate calcification of the aorta with stable mediastinal configuration. There is osteopenia, thoracic spondylosis and slight dextroscoliosis. No appreciable pleural effusion. IMPRESSION: Patchy opacity in the left lower lung field concerning for consolidated pneumonia. Clinical correlation and radiographic follow-up recommended. Electronically Signed   By: Almira Bar M.D.   On: 01/22/2022 07:07    Assessment/Plan  1. Atherosclerosis of abdominal aorta (HCC) Treating blood pressure and lipids  2. Atrial flutter, unspecified type (HCC) No current treatment had been on and off now back on Eliquis 3. Cognitive impairment Supportive care only MMSE 6 months ago was 23/30  4. Hyperlipidemia, unspecified hyperlipidemia type  LDL at 127 on atorvastatin   Family/ staff Communication:   Labs/tests ordered:  .smmsig

## 2022-08-31 ENCOUNTER — Other Ambulatory Visit: Payer: Self-pay

## 2022-08-31 ENCOUNTER — Emergency Department (HOSPITAL_COMMUNITY)
Admission: EM | Admit: 2022-08-31 | Discharge: 2022-08-31 | Disposition: A | Discharge status: Home / Self Care | Payer: Medicare Other | Attending: Emergency Medicine | Admitting: Emergency Medicine

## 2022-08-31 ENCOUNTER — Encounter (HOSPITAL_COMMUNITY): Payer: Self-pay | Admitting: Emergency Medicine

## 2022-08-31 ENCOUNTER — Emergency Department (HOSPITAL_COMMUNITY): Payer: Medicare Other

## 2022-08-31 DIAGNOSIS — R55 Syncope and collapse: Secondary | ICD-10-CM | POA: Diagnosis not present

## 2022-08-31 DIAGNOSIS — K5900 Constipation, unspecified: Secondary | ICD-10-CM | POA: Diagnosis not present

## 2022-08-31 DIAGNOSIS — Z7901 Long term (current) use of anticoagulants: Secondary | ICD-10-CM | POA: Diagnosis not present

## 2022-08-31 DIAGNOSIS — I10 Essential (primary) hypertension: Secondary | ICD-10-CM | POA: Insufficient documentation

## 2022-08-31 DIAGNOSIS — R42 Dizziness and giddiness: Secondary | ICD-10-CM | POA: Diagnosis not present

## 2022-08-31 DIAGNOSIS — Z79899 Other long term (current) drug therapy: Secondary | ICD-10-CM | POA: Insufficient documentation

## 2022-08-31 LAB — URINALYSIS, ROUTINE W REFLEX MICROSCOPIC
Bilirubin Urine: NEGATIVE
Glucose, UA: NEGATIVE mg/dL
Ketones, ur: NEGATIVE mg/dL
Nitrite: NEGATIVE
Protein, ur: 30 mg/dL — AB
RBC / HPF: >50 RBC/hpf (ref 0–5)
Specific Gravity, Urine: 1.013 (ref 1.005–1.030)
WBC, UA: >50 WBC/hpf (ref 0–5)
pH: 6 (ref 5.0–8.0)

## 2022-08-31 LAB — CBC
HCT: 40.3 % (ref 36.0–46.0)
Hemoglobin: 12.7 g/dL (ref 12.0–15.0)
MCH: 27 pg (ref 26.0–34.0)
MCHC: 31.5 g/dL (ref 30.0–36.0)
MCV: 85.7 fL (ref 80.0–100.0)
Platelets: 227 10*3/uL (ref 150–400)
RBC: 4.7 MIL/uL (ref 3.87–5.11)
RDW: 15.2 % (ref 11.5–15.5)
WBC: 9 10*3/uL (ref 4.0–10.5)
nRBC: 0 % (ref 0.0–0.2)

## 2022-08-31 LAB — COMPREHENSIVE METABOLIC PANEL
ALT: 19 U/L (ref 0–44)
AST: 27 U/L (ref 15–41)
Albumin: 3.4 g/dL — ABNORMAL LOW (ref 3.5–5.0)
Alkaline Phosphatase: 83 U/L (ref 38–126)
Anion gap: 8 (ref 5–15)
BUN: 21 mg/dL (ref 8–23)
CO2: 24 mmol/L (ref 22–32)
Calcium: 8.7 mg/dL — ABNORMAL LOW (ref 8.9–10.3)
Chloride: 104 mmol/L (ref 98–111)
Creatinine, Ser: 1.17 mg/dL — ABNORMAL HIGH (ref 0.44–1.00)
GFR, Estimated: 44 mL/min — ABNORMAL LOW (ref >60)
Glucose, Bld: 108 mg/dL — ABNORMAL HIGH (ref 70–99)
Potassium: 3.9 mmol/L (ref 3.5–5.1)
Sodium: 136 mmol/L (ref 135–145)
Total Bilirubin: 0.7 mg/dL (ref 0.3–1.2)
Total Protein: 7.2 g/dL (ref 6.5–8.1)

## 2022-08-31 LAB — I-STAT CHEM 8, ED
BUN: 26 mg/dL — ABNORMAL HIGH (ref 8–23)
Calcium, Ion: 1.15 mmol/L (ref 1.15–1.40)
Chloride: 102 mmol/L (ref 98–111)
Creatinine, Ser: 1.3 mg/dL — ABNORMAL HIGH (ref 0.44–1.00)
Glucose, Bld: 105 mg/dL — ABNORMAL HIGH (ref 70–99)
HCT: 40 % (ref 36.0–46.0)
Hemoglobin: 13.6 g/dL (ref 12.0–15.0)
Potassium: 4.3 mmol/L (ref 3.5–5.1)
Sodium: 138 mmol/L (ref 135–145)
TCO2: 27 mmol/L (ref 22–32)

## 2022-08-31 LAB — PROTIME-INR
INR: 1.3 — ABNORMAL HIGH (ref 0.8–1.2)
Prothrombin Time: 16.2 seconds — ABNORMAL HIGH (ref 11.4–15.2)

## 2022-08-31 LAB — DIFFERENTIAL
Abs Immature Granulocytes: 0.03 10*3/uL (ref 0.00–0.07)
Basophils Absolute: 0.1 10*3/uL (ref 0.0–0.1)
Basophils Relative: 1 %
Eosinophils Absolute: 0.1 10*3/uL (ref 0.0–0.5)
Eosinophils Relative: 1 %
Immature Granulocytes: 0 %
Lymphocytes Relative: 11 %
Lymphs Abs: 1 10*3/uL (ref 0.7–4.0)
Monocytes Absolute: 0.8 10*3/uL (ref 0.1–1.0)
Monocytes Relative: 9 %
Neutro Abs: 7 10*3/uL (ref 1.7–7.7)
Neutrophils Relative %: 78 %

## 2022-08-31 LAB — ETHANOL: Alcohol, Ethyl (B): <10 mg/dL (ref <10)

## 2022-08-31 LAB — RAPID URINE DRUG SCREEN, HOSP PERFORMED
Amphetamines: NOT DETECTED
Barbiturates: NOT DETECTED
Benzodiazepines: NOT DETECTED
Cocaine: NOT DETECTED
Opiates: NOT DETECTED
Tetrahydrocannabinol: NOT DETECTED

## 2022-08-31 LAB — APTT: aPTT: 29 seconds (ref 24–36)

## 2022-08-31 LAB — TROPONIN I (HIGH SENSITIVITY): Troponin I (High Sensitivity): 9 ng/L (ref <18)

## 2022-08-31 NOTE — ED Triage Notes (Signed)
Pt BIB EMS from [facility] after observed LOC. Pt noted on the bathroom toilet slumped over on the wall, unable to arouse. Per staff unresponsive for 15 seconds. Returned back to baseline with increased weakness. Hx CVA, no blood thinners.   BP 110/60 P 90 RR 18 spO2 97% CBG 126

## 2022-08-31 NOTE — ED Notes (Signed)
PTAR at bedside 

## 2022-08-31 NOTE — ED Provider Notes (Signed)
[facility] AT [facility] Provider Note   CSN: [id number] Arrival date & time: 08/31/22  1037     History {Add pertinent medical, surgical, social history, OB history to HPI:1} Chief Complaint  Patient presents with   Loss of Consciousness    [name] is a 89 y.o. female with PMHx CVA, HLD, HTN who presents to the ED brought in by EMS from nursing facility concerned for loss of consciousness earlier this morning around 10AM. Staff stating that patient was "slumped over on the wall" while sitting on bathroom toilet and unable to arouse for 15 seconds. Patient denies LOC. Patient stating that she felt lightheaded this morning. Also endorsing constipation and states that she was trying to have a bowel movement on the toilet this morning. Denies blood thinners.  Patient denies fever, chest pain, dyspnea, cough, abdominal pain, nausea, vomiting, diarrhea, vision changes, headache, dysuria, hematuria.   Loss of Consciousness      Home Medications Prior to Admission medications   Medication Sig Start Date End Date Taking? Authorizing Provider  acetaminophen (TYLENOL) 325 MG tablet Take 650 mg by mouth every 4 (four) hours as needed for fever.    [provider]  acetaminophen (TYLENOL) 325 MG tablet Take 650 mg by mouth every 4 (four) hours as needed for moderate pain.    [provider]  amLODipine (NORVASC) 2.5 MG tablet Take 2.5 mg by mouth at bedtime.    [provider]  apixaban (ELIQUIS) 5 MG TABS tablet Take 1 tablet (5 mg total) by mouth 2 (two) times daily. 11/29/21   [name], MD  atorvastatin (LIPITOR) 40 MG tablet Take 1 tablet (40 mg total) by mouth daily. 11/28/21   [name], MD  B Complex-C-Folic Acid (B-COMPLEX/FOLIC ACID/VITAMIN C PO) Take 1 tablet by mouth daily with breakfast.    [provider]  bimatoprost (LUMIGAN) 0.03 % ophthalmic solution Place 1 drop into both eyes at bedtime.     [provider]  Cholecalciferol (VITAMIN D3) 50 MCG (2000 UT) TABS Take 2,000 Units by mouth in the morning.    [provider]  cloNIDine (CATAPRES) 0.1 MG tablet Take 0.1 mg by mouth daily.    [provider]  diclofenac Sodium (VOLTAREN) 1 % GEL Apply 2 g topically in the morning and at bedtime.    [provider]  docusate sodium (COLACE) 100 MG capsule Take 100 mg by mouth daily.    [provider]  levothyroxine (SYNTHROID) 25 MCG tablet Take 25 mcg by mouth daily before breakfast.    [provider]  melatonin 3 MG TABS tablet Take 1 tablet (3 mg total) by mouth at bedtime. 04/11/20   [name], DO  miconazole (MICOTIN) 2 % powder Apply topically daily. Patient may apply to dry skin under breasts daily and after showering    [provider]  mirabegron ER (MYRBETRIQ) 25 MG TB24 tablet Take 25 mg by mouth daily.    [provider]  nystatin (MYCOSTATIN/NYSTOP) powder Apply 1 application topically 2 (two) times daily as needed.    [provider]  pantoprazole (PROTONIX) 40 MG tablet Take 40 mg by mouth daily.    [provider]  polyvinyl alcohol (LIQUIFILM TEARS) 1.4 % ophthalmic solution Place 1 drop into both eyes in the morning, at noon, in the evening, and at bedtime.    [provider]  sodium chloride 1 g tablet Take 1 g by mouth 2 (  two) times daily.    [provider]      Allergies    Morphine and codeine, Morphine sulfate, Penicillins, Nsaids, Penicillin v, Shellfish allergy, and Shellfish-derived products    Review of Systems   Review of Systems  Cardiovascular:  Positive for syncope.    Physical Exam Updated Vital Signs BP (!) 145/65 (BP Location: Right Arm)   Pulse 73   Temp 97.8 F (36.6 C) (Oral)   Resp 18   SpO2 98%  Physical Exam Vitals and nursing note reviewed.  Constitutional:      General: She is not in acute distress.    Appearance: She is not  ill-appearing or toxic-appearing.  HENT:     Head: Normocephalic and atraumatic.     Mouth/Throat:     Mouth: Mucous membranes are moist.  Eyes:     General: No scleral icterus.       Right eye: No discharge.        Left eye: No discharge.     Conjunctiva/sclera: Conjunctivae normal.  Cardiovascular:     Rate and Rhythm: Normal rate and regular rhythm.     Pulses: Normal pulses.     Heart sounds: Normal heart sounds. No murmur heard. Pulmonary:     Effort: Pulmonary effort is normal. No respiratory distress.     Breath sounds: Normal breath sounds. No wheezing, rhonchi or rales.  Abdominal:     General: Abdomen is flat. Bowel sounds are normal. There is no distension.     Palpations: Abdomen is soft. There is no mass.     Tenderness: There is no abdominal tenderness.  Musculoskeletal:     Right lower leg: No edema.     Left lower leg: No edema.  Skin:    General: Skin is warm and dry.     Findings: No rash.  Neurological:     General: No focal deficit present.     Mental Status: She is alert and oriented to person, place, and time. Mental status is at baseline.     Cranial Nerves: No cranial nerve deficit.     Sensory: No sensory deficit.     Motor: No weakness.     Comments: GCS 15. Speech is goal oriented. No deficits appreciated to CN III-XII; symmetric eyebrow raise, no facial drooping, tongue midline. Patient has equal grip strength bilaterally with 5/5 strength against resistance in all major muscle groups bilaterally. Sensation to light touch intact. Patient moves extremities without ataxia. Patient ambulatory with steady gait.   Psychiatric:        Mood and Affect: Mood normal.        Behavior: Behavior normal.    ED Results / Procedures / Treatments   Labs (all labs ordered are listed, but only abnormal results are displayed) Labs Reviewed  ETHANOL  PROTIME-INR  APTT  CBC  DIFFERENTIAL  COMPREHENSIVE METABOLIC PANEL  RAPID URINE DRUG SCREEN, HOSP PERFORMED   URINALYSIS, ROUTINE W REFLEX MICROSCOPIC  I-STAT CHEM 8, ED    EKG None  Radiology No results found.  Procedures Procedures  {Document cardiac monitor, telemetry assessment procedure when appropriate:1}  Medications Ordered in ED Medications - No data to display  ED Course/ Medical Decision Making/ A&P   {   Click here for ABCD2, HEART and other calculatorsREFRESH Note before signing :1}                          Medical Decision Making Amount  and/or Complexity of Data Reviewed Labs: ordered. Radiology: ordered.   This patient presents to the ED for concern of AMS, this involves an extensive number of treatment options, and is a complaint that carries with it a high risk of complications and morbidity.  The differential diagnosis includes CVA, ICH, intracranial mass, critical dehydration, heptatic dysfunction, uremia, hypercarbia, intoxication/withdrawal, endocrine abnormality, sepsis/infection.   Co morbidities that complicate the patient evaluation  CVA, HLD, HTN    Lab Tests:  I Ordered, and personally interpreted labs.  The pertinent results include: CBC: No concern for anemia or leukocytosis CMP: no concern for electrolyte abnormality; no concern for kidney/liver damage UA: large leukocytes UDS: negative Troponin: within normal limits PT-INR: mildly elevated APTT: within normal limits ETOH: negative    Imaging Studies ordered:  I ordered imaging studies including  -CT head: to assess for process contributing to patient's symptoms I independently visualized and interpreted imaging  I agree with the radiologist interpretation   Cardiac Monitoring: / EKG:  The patient was maintained on a cardiac monitor.  I personally viewed and interpreted the cardiac monitored which showed an underlying rhythm of: Sinus rhythm without acute ST changes or arrhythmias   Problem List / ED Course / Critical interventions / Medication management  Patient presented for  loss of consciousness.  Physical and neuroexam unremarkable.  Patient AAOx4.  UDS negative.  CMP without electrolyte abnormalities.  CBC without leukocytosis or anemia.  Urinalysis showing large leukocytes and 6-10 squamous cells - patient denying dysuria, hematuria, or urinary frequency. Getting last troponin - if normal good for discharge. If patient is without urinary symptoms, she does not need ABX. I have reviewed the patients home medicines and have made adjustments as needed Patient was given return precautions. Patient stable for discharge at this time. Patient verbalized understanding of plan.  Staffed with Dr. Theresia Lo   DDx: These were considered less likely due to history of present illness and physical exam findings CVA/ICH/intracranial mass: CT without concern critical dehydration/heptatic dysfunction/Uremia: CMP without concern Hypercarbia/Intoxication/withdrawal: patient history inconsistent with this diagnosis  endocrine abnormality: Sepsis: patient afebrile with stable vitals   Social Determinants of Health:  none     {Document critical care time when appropriate:1} {Document review of labs and clinical decision tools ie heart score, Chads2Vasc2 etc:1}  {Document your independent review of radiology images, and any outside records:1} {Document your discussion with family members, caretakers, and with consultants:1} {Document social determinants of health affecting pt's care:1} {Document your decision making why or why not admission, treatments were needed:1} Final Clinical Impression(s) / ED Diagnoses Final diagnoses:  None    Rx / DC Orders ED Discharge Orders     None

## 2022-08-31 NOTE — Discharge Instructions (Addendum)
It was a pleasure caring for you today. Lab work up and imaging was reassuring.  As for patient's constipation, I recommend a daily bowel regimen at her nursing facility.  Patient should be having at least 1 bowel movement every 2 to 3 days.  Seek emergency care if experiencing any new or worsening symptoms.

## 2022-08-31 NOTE — ED Notes (Signed)
Call placed to [facility] for transportation.  

## 2022-09-03 ENCOUNTER — Encounter: Payer: Self-pay | Admitting: Nurse Practitioner

## 2022-09-03 ENCOUNTER — Non-Acute Institutional Stay (SKILLED_NURSING_FACILITY): Payer: Medicare Other | Admitting: Nurse Practitioner

## 2022-09-03 DIAGNOSIS — R350 Frequency of micturition: Secondary | ICD-10-CM

## 2022-09-03 DIAGNOSIS — R55 Syncope and collapse: Secondary | ICD-10-CM | POA: Diagnosis not present

## 2022-09-03 DIAGNOSIS — F0390 Unspecified dementia without behavioral disturbance: Secondary | ICD-10-CM

## 2022-09-03 DIAGNOSIS — F5101 Primary insomnia: Secondary | ICD-10-CM

## 2022-09-03 DIAGNOSIS — M159 Polyosteoarthritis, unspecified: Secondary | ICD-10-CM

## 2022-09-03 DIAGNOSIS — E039 Hypothyroidism, unspecified: Secondary | ICD-10-CM | POA: Diagnosis not present

## 2022-09-03 DIAGNOSIS — M810 Age-related osteoporosis without current pathological fracture: Secondary | ICD-10-CM

## 2022-09-03 DIAGNOSIS — I10 Essential (primary) hypertension: Secondary | ICD-10-CM

## 2022-09-03 DIAGNOSIS — N1831 Chronic kidney disease, stage 3a: Secondary | ICD-10-CM | POA: Diagnosis not present

## 2022-09-03 DIAGNOSIS — E785 Hyperlipidemia, unspecified: Secondary | ICD-10-CM

## 2022-09-03 DIAGNOSIS — K5901 Slow transit constipation: Secondary | ICD-10-CM

## 2022-09-03 DIAGNOSIS — I639 Cerebral infarction, unspecified: Secondary | ICD-10-CM

## 2022-09-03 DIAGNOSIS — I4892 Unspecified atrial flutter: Secondary | ICD-10-CM

## 2022-09-03 DIAGNOSIS — E871 Hypo-osmolality and hyponatremia: Secondary | ICD-10-CM

## 2022-09-03 NOTE — Assessment & Plan Note (Signed)
SNF [facility] for supportive care, 01/29/22 MMSE 23/30 

## 2022-09-03 NOTE — Assessment & Plan Note (Signed)
on Atorvastatin, LDL 127 11/25/21 

## 2022-09-03 NOTE — Assessment & Plan Note (Signed)
At her baseline,  on Mirabegron

## 2022-09-03 NOTE — Assessment & Plan Note (Signed)
Na 136 08/31/22

## 2022-09-03 NOTE — Assessment & Plan Note (Signed)
takes Levothyroxine, TSH 3.19 03/27/22 

## 2022-09-03 NOTE — Assessment & Plan Note (Signed)
chronic R ankle pain, uses ankle brace, ambulates with walker slowly,  w/c to go further 

## 2022-09-03 NOTE — Assessment & Plan Note (Signed)
Loose blood pressure control, takes Amlodipine, prn Clonidine

## 2022-09-03 NOTE — Assessment & Plan Note (Signed)
ED eval 08/31/22 vasovagal syncope sound slumped over, unconscious for several seconds while on commode working on constipation, s/p IVF, improved Bun/creat, unremarkable labs, EKG, CT head   Ortho stasis ?, no further recurrence since admitted to SNF [facility] until 08/31/22  Avoid constipation, dehydration, intra abdominal pressure, change position quick

## 2022-09-03 NOTE — Assessment & Plan Note (Signed)
takes Ca, Vit D

## 2022-09-03 NOTE — Assessment & Plan Note (Signed)
takes Colace. MiraLax qod

## 2022-09-03 NOTE — Assessment & Plan Note (Signed)
Bun/creat 21/1.17 08/31/22

## 2022-09-03 NOTE — Progress Notes (Unsigned)
Location:   [facility] Room Number: 44 Place of Service:  SNF (31) Provider: Arna Snipe Fadi Menter NP  Kahner Yanik X, NP  Patient Care Team: Braxson Hollingsworth X, NP as PCP - General (Internal Medicine) [name], MD as PCP - Cardiology (Cardiology) Olivia Mackie, MD as Consulting Physician (Obstetrics and Gynecology) De Blanch, MD as Consulting Physician (Gynecology) Cleda Mccreedy, MD as Consulting Physician (Gynecologic Oncology)  Extended Emergency Contact Information Primary Emergency Contact: [facility] Address: [address]          [place], [place] [address] [place] of [place] Home Phone: [phone] Mobile Phone: [phone] Relation: Daughter  Code Status: DNR Goals of care: Advanced Directive information    07/16/2022   12:20 PM  Advanced Directives  Does Patient Have a Medical Advance Directive? Yes  Type of Estate agent of [place];Living will  Does patient want to make changes to medical advance directive? No - Patient declined  Copy of Healthcare Power of Attorney in Chart? Yes - validated most recent copy scanned in chart (See row information)     Chief Complaint  Patient presents with  . Acute Visit    F/u ED visit.     HPI:  Pt is a 89 y.o. female seen today for an acute visit for ED eval 08/31/22 vasovagal syncope sound slumped over, unconscious for several seconds while on commode working on constipation, s/p IVF, improved Bun/creat, unremarkable labs, EKG, CT head.    11/24/21 acute CVA left PCA embolic strokes, no apparent residual symptoms, on Eliquis f/u Neurology             CKD, Bun/creat 21/1.17 08/31/22             Hypothyroidism, takes Levothyroxine, TSH 3.19 03/27/22             Hyponatremia, Na 136 08/31/22             Syncope: Orthostasis ?, no further recurrence since admitted to SNF [facility] until 08/31/22             A flutter, f/u Cardiology, off anticoagulation 2/2 falls in the past,  restarted Eliquis after acute CVA 11/24/21             Hx of ovarian CA s/p TAH/BSO 03/2011, in remission.             Senile dementia,  SNF [facility] for supportive care, 01/29/22 MMSE 23/30             Constipation takes Colace. MiraLax qod             HTN, takes Amlodipine, prn Clonidine             OP, takes Ca, Vit D             Insomnia, off Ambien, off Melatonin.              Hyperlipidemia, on Atorvastatin, LDL 127 11/25/21             OA, chronic R ankle pain, uses ankle brace,  ambulates with walker slowly,  w/c to go further             Urinary frequency, on Mirabegron Past Medical History:  Diagnosis Date  . Anemia associated with acute blood loss 04/03/2011  . Arthritis    knees   . Ascites   . Blood transfusion    hx of 2011   . Complication of anesthesia    Sodium drops per  pt   . Cystitis   . DIARRHEA, ANTIBIOTIC ASSOCIATED 05/31/2009   Qualifier: Diagnosis of  By: Daiva Eves MD, Remi Haggard    . GERD (gastroesophageal reflux disease)   . H/O hiatal hernia   . Hyperlipidemia   . Hypertension   . Hyponatremia   . Neutropenia with fever (HCC) 05/18/2011  . OSTEOMYELITIS, CHRONIC, LOWER LEG 04/23/2009   Qualifier: Diagnosis of  By: Ninetta Lights MD, Tinnie Gens    . Osteopenia   . OSTEOPOROSIS 04/23/2009   Qualifier: Diagnosis of  By: Ninetta Lights MD, Tinnie Gens    . Ovarian cancer (HCC) 01/25/2011  . Pneumonia    hx of   . Postmenopausal atrophic vaginitis   . PROSTHETIC JOINT COMPLICATION 05/28/2009   Qualifier: Diagnosis of  By: Daiva Eves MD, Remi Haggard    . Renal disorder    Decreased kidney function  . Staph infection 2010   after knee replacement  . Urinary frequency   . Vulvitis    Past Surgical History:  Procedure Laterality Date  . ABDOMINAL HYSTERECTOMY  04/01/2011   Procedure: HYSTERECTOMY ABDOMINAL;  Surgeon: Jeannette Corpus, MD;  Location: [facility];  Service: Gynecology;  Laterality: N/A;  . APPENDECTOMY    . JOINT REPLACEMENT     R knee in 2008, 5 operations on L knee  .  LAPAROTOMY  04/01/2011   Procedure: EXPLORATORY LAPAROTOMY;  Surgeon: Jeannette Corpus, MD;  Location: [facility];  Service: Gynecology;  Laterality: N/A;  . OTHER SURGICAL HISTORY     hx of C section 1966  . SALPINGOOPHORECTOMY  04/01/2011   Procedure: SALPINGO OOPHERECTOMY;  Surgeon: Jeannette Corpus, MD;  Location: [facility];  Service: Gynecology;  Laterality: Bilateral;  . TONSILLECTOMY      Allergies  Allergen Reactions  . Morphine And Codeine Anaphylaxis and Other (See Comments)    Given after knee replacement and code blue occurred  . Morphine Sulfate Anaphylaxis  . Penicillins Hives, Itching and Other (See Comments)    Syncope, also  . Nsaids Other (See Comments)    Because of an abnormal kidney test result  . Penicillin V Hives  . Shellfish Allergy Nausea And Vomiting and Other (See Comments)    Pt has shellfish allergy only.  Has had IV contrast x 2 and did fine.  Marland Kitchen Shellfish-Derived Products Nausea And Vomiting    Allergies as of 09/03/2022       Reactions   Morphine And Codeine Anaphylaxis, Other (See Comments)   Given after knee replacement and code blue occurred   Morphine Sulfate Anaphylaxis   Penicillins Hives, Itching, Other (See Comments)   Syncope, also   Nsaids Other (See Comments)   Because of an abnormal kidney test result   Penicillin V Hives   Shellfish Allergy Nausea And Vomiting, Other (See Comments)   Pt has shellfish allergy only.  Has had IV contrast x 2 and did fine.   Shellfish-derived Products Nausea And Vomiting        Medication List        Accurate as of September 03, 2022 11:59 PM. If you have any questions, ask your nurse or doctor.          acetaminophen 325 MG tablet Commonly known as: TYLENOL Take 650 mg by mouth every 4 (four) hours as needed for moderate pain or fever.   amLODipine 5 MG tablet Commonly known as: NORVASC Take 5 mg by mouth daily.   apixaban 5 MG Tabs tablet Commonly known as: ELIQUIS Take 1 tablet  (  5 mg total) by mouth 2 (two) times daily.   atorvastatin 40 MG tablet Commonly known as: LIPITOR Take 1 tablet (40 mg total) by mouth daily. What changed: when to take this   B-COMPLEX/FOLIC ACID/VITAMIN C PO Take 1 tablet by mouth daily with breakfast.   bimatoprost 0.03 % ophthalmic solution Commonly known as: LUMIGAN Place 1 drop into both eyes at bedtime.   cloNIDine 0.1 MG tablet Commonly known as: CATAPRES Take 0.1 mg by mouth daily as needed (hypertension).   diclofenac Sodium 1 % Gel Commonly known as: VOLTAREN Apply 2 g topically in the morning and at bedtime.   docusate sodium 100 MG capsule Commonly known as: COLACE Take 100 mg by mouth daily.   levothyroxine 25 MCG tablet Commonly known as: SYNTHROID Take 25 mcg by mouth daily before breakfast.   melatonin 3 MG Tabs tablet Take 1 tablet (3 mg total) by mouth at bedtime.   miconazole 2 % powder Commonly known as: MICOTIN Apply topically daily. Patient may apply to dry skin under breasts daily and after showering   Myrbetriq 25 MG Tb24 tablet Generic drug: mirabegron ER Take 25 mg by mouth daily.   nystatin powder Commonly known as: MYCOSTATIN/NYSTOP Apply 1 application  topically 2 (two) times daily as needed (reason not listed).   pantoprazole 40 MG tablet Commonly known as: PROTONIX Take 40 mg by mouth daily.   polyvinyl alcohol 1.4 % ophthalmic solution Commonly known as: LIQUIFILM TEARS Place 2 drops into both eyes in the morning, at noon, in the evening, and at bedtime.   sodium chloride 1 g tablet Take 1 g by mouth 2 (two) times daily.   Vitamin D3 50 MCG (2000 UT) Tabs Take 2,000 Units by mouth in the morning.        Review of Systems  Constitutional:  Negative for appetite change and fever.  HENT:  Positive for hearing loss. Negative for congestion and trouble swallowing.   Eyes:  Negative for visual disturbance.  Respiratory:  Negative for shortness of breath.   Cardiovascular:   Negative for leg swelling.  Gastrointestinal:  Negative for abdominal pain and constipation.  Genitourinary:  Positive for frequency. Negative for difficulty urinating, dysuria, flank pain and urgency.  Musculoskeletal:  Positive for arthralgias, back pain and gait problem.       R ankle pain is chronic, R leg pain is new.   Skin:  Negative for color change.  Neurological:  Negative for speech difficulty, weakness and light-headedness.       Memory deficit  Psychiatric/Behavioral:  Positive for confusion. Negative for sleep disturbance. The patient is not nervous/anxious.     Immunization History  Administered Date(s) Administered  . Influenza Split 11/13/2012, 12/13/2012, 11/30/2013, 10/09/2014, 11/24/2018  . Influenza, High Dose Seasonal PF 12/01/2013, 10/04/2014, 10/12/2015, 11/06/2016, 11/12/2017  . Influenza,inj,Quad PF,6+ Mos 10/24/2010  . Influenza-Unspecified 10/24/2010, 11/01/2019, 12/04/2020  . Moderna Covid-19 Vaccine Bivalent Booster 20yrs & up 07/24/2021  . Moderna SARS-COV2 Booster Vaccination 07/18/2020  . Moderna Sars-Covid-2 Vaccination 02/14/2019, 03/14/2019, 12/26/2019  . PFIZER(Purple Top)SARS-COV-2 Vaccination 10/31/2020  . PPD Test 11/27/2021  . Research officer, [employer] 70yrs & up 10/31/2020  . Pneumococcal Conjugate-13 09/28/2013  . Pneumococcal Polysaccharide-23 03/05/2012  . Td 10/16/2010  . Td (Adult) 08/27/2000  . Tdap 08/27/2000, 10/16/2010  . Zoster, Live 01/11/2013, 03/31/2020   Pertinent  Health Maintenance Due  Topic Date Due  . INFLUENZA VACCINE  09/11/2022  . DEXA SCAN  Completed  02/12/2022    4:30 PM 03/25/2022   11:07 AM 04/03/2022    3:37 PM 04/18/2022    9:19 AM 07/04/2022   10:17 AM  Fall Risk  Falls in the past year? 0 0 0 0 1  Was there an injury with Fall? 0 0 0 0 0  Fall Risk Category Calculator 0 0 0 0 2  Fall Risk Category (Retired) Low      (RETIRED) Patient Fall Risk Level High fall risk      Patient at Risk  for Falls Due to History of fall(s);Impaired balance/gait;Impaired mobility;Mental status change History of fall(s);Impaired balance/gait;Impaired mobility;Mental status change History of fall(s);Impaired balance/gait;Impaired mobility;Mental status change History of fall(s);Impaired balance/gait;Impaired mobility;Mental status change History of fall(s);Impaired balance/gait;Impaired mobility;Mental status change  Fall risk Follow up Falls evaluation completed Falls evaluation completed Falls evaluation completed Falls evaluation completed Falls evaluation completed   Functional Status Survey:    Vitals:   09/03/22 1501 09/04/22 1024  BP: (!) 148/78 (!) 160/83  Pulse: 66   Resp: 20   Temp: (!) 97.1 F (36.2 C)   SpO2: 95%   Weight: 184 lb 3.2 oz (83.6 kg)    Body mass index is 32.63 kg/m. Physical Exam Vitals and nursing note reviewed.  Constitutional:      Appearance: Normal appearance.  HENT:     Head: Normocephalic and atraumatic.     Mouth/Throat:     Mouth: Mucous membranes are moist.  Eyes:     Extraocular Movements: Extraocular movements intact.     Conjunctiva/sclera: Conjunctivae normal.     Pupils: Pupils are equal, round, and reactive to light.  Cardiovascular:     Rate and Rhythm: Normal rate. Rhythm irregular.     Heart sounds: No murmur heard. Pulmonary:     Effort: Pulmonary effort is normal.     Breath sounds: No rales.  Abdominal:     General: Bowel sounds are normal.     Palpations: Abdomen is soft.     Tenderness: There is no abdominal tenderness.     Hernia: A hernia is present.     Comments: Incision hernia  Musculoskeletal:        General: Tenderness present.     Cervical back: Normal range of motion and neck supple.     Right lower leg: No edema.     Left lower leg: No edema.     Comments: Right ankle brace, chronic pain. New right leg pain, not able to specify the location, stopped using walker to ambulate, no redness or deformity noted.    Skin:    General: Skin is warm and dry.     Comments: Scattered scratched spots upper back, neck, no apparent rash, the patient stated she scratched because of itching.   Neurological:     General: No focal deficit present.     Mental Status: She is alert. Mental status is at baseline.     Motor: No weakness.     Gait: Gait abnormal.     Comments: Oriented to person, time  Psychiatric:        Mood and Affect: Mood normal.        Thought Content: Thought content normal.     Comments: Confused her locations.     Labs reviewed: Recent Labs    11/26/21 0420 01/22/22 0925 04/08/22 0755 08/31/22 1134  NA 135 138 139 136  138  K 3.7 4.5 4.3 3.9  4.3  CL 104 104 104 104  102  CO2 23 25 24* 24  GLUCOSE 109* 125*  --  108*  105*  BUN 29* 21 24* 21  26*  CREATININE 1.23* 1.23* 1.2* 1.17*  1.30*  CALCIUM 8.7* 9.3 9.3 8.7*   Recent Labs    11/24/21 1023 01/22/22 0925 08/31/22 1134  AST 23 26 27   ALT 16 16 19   ALKPHOS 67 68 83  BILITOT 0.6 0.6 0.7  PROT 8.5* 7.1 7.2  ALBUMIN 4.2 3.5 3.4*   Recent Labs    11/26/21 0420 01/22/22 0925 04/08/22 0755 08/31/22 1134  WBC 8.9 7.8 7.3 9.0  NEUTROABS  --  6.3 3,833.00 7.0  HGB 12.7 12.6 12.4 12.7  13.6  HCT 40.1 40.6 38 40.3  40.0  MCV 84.2 84.1  --  85.7  PLT 202 191 224 227   Lab Results  Component Value Date   TSH 3.19 03/27/2022   Lab Results  Component Value Date   HGBA1C 5.7 (H) 11/24/2021   Lab Results  Component Value Date   CHOL 181 11/25/2021   HDL 37 (L) 11/25/2021   LDLCALC 127 (H) 11/25/2021   TRIG 85 11/25/2021   CHOLHDL 4.9 11/25/2021    Significant Diagnostic Results in last 30 days:  CT HEAD WO CONTRAST  Result Date: 08/31/2022 CLINICAL DATA:  Syncope/presyncope with cerebrovascular cause suspected. EXAM: CT HEAD WITHOUT CONTRAST TECHNIQUE: Contiguous axial images were obtained from the base of the skull through the vertex without intravenous contrast. RADIATION DOSE REDUCTION: This exam  was performed according to the departmental dose-optimization program which includes automated exposure control, adjustment of the mA and/or kV according to patient size and/or use of iterative reconstruction technique. COMPARISON:  Head CT 01/22/2022 and brain MRI from same date FINDINGS: Brain: No evidence of acute infarction, hemorrhage, hydrocephalus, extra-axial collection or mass lesion/mass effect. Extensive chronic small vessel ischemia in the cerebral white matter. Small chronic left parietal cortex infarct confirmed on brain MRI. Generalized brain atrophy. Vascular: No hyperdense vessel or unexpected calcification. Skull: Normal. Negative for fracture or focal lesion. Sinuses/Orbits: Chronic, partial left sphenoid sinus opacification. Negative orbits. IMPRESSION: 1. No acute or interval finding. 2. Atrophy and extensive chronic small vessel ischemia. Electronically Signed   By: Tiburcio Pea M.D.   On: 08/31/2022 12:11    Assessment/Plan: Syncope and collapse ED eval 08/31/22 vasovagal syncope sound slumped over, unconscious for several seconds while on commode working on constipation, s/p IVF, improved Bun/creat, unremarkable labs, EKG, CT head   Ortho stasis ?, no further recurrence since admitted to SNF [facility] until 08/31/22  Avoid constipation, dehydration, intra abdominal pressure, change position quick  Stroke (cerebrum) (HCC) 11/24/21 acute CVA left PCA embolic strokes, no apparent residual symptoms, on Eliquis f/u Neurology  CKD (chronic kidney disease) stage 3, GFR 30-59 ml/min (HCC) Bun/creat 21/1.17 08/31/22  Hypothyroidism  takes Levothyroxine, TSH 3.19 03/27/22  Hyponatremia  Na 136 08/31/22  Atrial flutter (HCC)  f/u Cardiology, off anticoagulation 2/2 falls in the past, restarted Eliquis after acute CVA 11/24/21  Senile dementia (HCC)  SNF [facility] for supportive care, 01/29/22 MMSE 23/30  Slow transit constipation  takes Colace. MiraLax qod  Essential hypertension Loose  blood pressure control, takes Amlodipine, prn Clonidine  Osteoporosis takes Ca, Vit D  Insomnia  off Ambien, off Melatonin.   HLD (hyperlipidemia) on Atorvastatin, LDL 127 11/25/21  Osteoarthritis chronic R ankle pain, uses ankle brace,  ambulates with walker slowly,  w/c to go further  Urinary frequency At her baseline, on Mirabegron  Family/ staff Communication: plan of care reviewed with the patient and charge nurse.   Labs/tests ordered:  none  Time spend 35 minutes.

## 2022-09-03 NOTE — Assessment & Plan Note (Signed)
f/u Cardiology, off anticoagulation 2/2 falls in the past, restarted Eliquis after acute CVA 11/24/21 

## 2022-09-03 NOTE — Assessment & Plan Note (Signed)
off Ambien, off Melatonin.

## 2022-09-03 NOTE — Assessment & Plan Note (Signed)
11/24/21 acute CVA left PCA embolic strokes, no apparent residual symptoms, on Eliquis f/u Neurology

## 2022-09-15 ENCOUNTER — Non-Acute Institutional Stay (SKILLED_NURSING_FACILITY): Payer: Medicare Other | Admitting: Orthopedic Surgery

## 2022-09-15 ENCOUNTER — Encounter: Payer: Self-pay | Admitting: Orthopedic Surgery

## 2022-09-15 DIAGNOSIS — U071 COVID-19: Secondary | ICD-10-CM | POA: Diagnosis not present

## 2022-09-15 MED ORDER — VITAMIN C 1000 MG PO TABS
1000.0000 mg | ORAL_TABLET | Freq: Every day | ORAL | Status: AC
Start: 2022-09-15 — End: 2022-09-22

## 2022-09-15 MED ORDER — ZINC GLUCONATE 50 MG PO TABS
50.0000 mg | ORAL_TABLET | Freq: Every day | ORAL | Status: AC
Start: 2022-09-15 — End: 2022-09-22

## 2022-09-15 NOTE — Progress Notes (Signed)
Location:   [facility]  Nursing Home Room Number: 43-A Place of Service:  SNF (31) Provider:  Hazle Nordmann, NP  PCP: Mast, Man X, NP  Patient Care Team: Mast, Man X, NP as PCP - General (Internal Medicine) [name], MD as PCP - Cardiology (Cardiology) Olivia Mackie, MD as Consulting Physician (Obstetrics and Gynecology) De Blanch, MD as Consulting Physician (Gynecology) Cleda Mccreedy, MD as Consulting Physician (Gynecologic Oncology)  Extended Emergency Contact Information Primary Emergency Contact: [facility] Address: [address]          [place], [place] [address] Darden [place] of [place] Home Phone: [phone] Mobile Phone: [phone] Relation: Daughter  Code Status:  FULL CODE Goals of care: Advanced Directive information    07/16/2022   12:20 PM  Advanced Directives  Does Patient Have a Medical Advance Directive? Yes  Type of Estate agent of [place];Living will  Does patient want to make changes to medical advance directive? No - Patient declined  Copy of Healthcare Power of Attorney in Chart? Yes - validated most recent copy scanned in chart (See row information)     Chief Complaint  Patient presents with   Acute Visit    Covid +    HPI:  Pt is a 89 y.o. female seen today for an acute visit due to positive covid test.   She currently resides on the skilled nursing unit at [facility]. PMH: atherosclerosis of abdominal aorta, HTN, atrial flutter, orthostatic hypotension, syncope, GERD, constipation, ovarian cancer, OAB,hyponatremia,dementia, OA, CKD III, anemia, obesity and insomnia.    Nursing reports positive covid test this morning. She is asymptomatic at this time. Poor historian due to dementia. She denies chest pain, sob, cough, body aches or sore throat. Afebrile. Vital stable.      Past Medical History:  Diagnosis Date   Anemia associated with acute blood loss 04/03/2011    Arthritis    knees    Ascites    Blood transfusion    hx of 2011    Complication of anesthesia    Sodium drops per pt    Cystitis    DIARRHEA, ANTIBIOTIC ASSOCIATED 05/31/2009   Qualifier: Diagnosis of  By: Daiva Eves MD, Cornelius     GERD (gastroesophageal reflux disease)    H/O hiatal hernia    Hyperlipidemia    Hypertension    Hyponatremia    Neutropenia with fever (HCC) 05/18/2011   OSTEOMYELITIS, CHRONIC, LOWER LEG 04/23/2009   Qualifier: Diagnosis of  By: Ninetta Lights MD, Linus Mako    OSTEOPOROSIS 04/23/2009   Qualifier: Diagnosis of  By: Ninetta Lights MD, Jeffrey     Ovarian cancer (HCC) 01/25/2011   Pneumonia    hx of    Postmenopausal atrophic vaginitis    PROSTHETIC JOINT COMPLICATION 05/28/2009   Qualifier: Diagnosis of  By: Daiva Eves MD, Cornelius     Renal disorder    Decreased kidney function   Staph infection 2010   after knee replacement   Urinary frequency    Vulvitis    Past Surgical History:  Procedure Laterality Date   ABDOMINAL HYSTERECTOMY  04/01/2011   Procedure: HYSTERECTOMY ABDOMINAL;  Surgeon: Jeannette Corpus, MD;  Location: [facility] ORS;  Service: Gynecology;  Laterality: N/A;   APPENDECTOMY     JOINT REPLACEMENT     R knee in 2008, 5 operations on L knee   LAPAROTOMY  04/01/2011   Procedure: EXPLORATORY LAPAROTOMY;  Surgeon: Jeannette Corpus, MD;  Location:  [facility] OR[facility];  Service: Gynecology;  Laterality: N/A;   OTHER SURGICAL HISTORY     hx of C section 1966   SALPINGOOPHORECTOMY  04/01/2011   Procedure: SALPINGO OOPHERECTOMY;  Surgeon: Jeannette Corpus, MD;  Location: [facility];  Service: Gynecology;  Laterality: Bilateral;   TONSILLECTOMY      Allergies  Allergen Reactions   Morphine And Codeine Anaphylaxis and Other (See Comments)    Given after knee replacement and code blue occurred   Morphine Sulfate Anaphylaxis   Penicillins Hives, Itching and Other (See Comments)    Syncope, also   Nsaids Other (See Comments)    Because of an  abnormal kidney test result   Penicillin V Hives   Shellfish Allergy Nausea And Vomiting and Other (See Comments)    Pt has shellfish allergy only.  Has had IV contrast x 2 and did fine.   Shellfish-Derived Products Nausea And Vomiting    Allergies as of 09/15/2022       Reactions   Morphine And Codeine Anaphylaxis, Other (See Comments)   Given after knee replacement and code blue occurred   Morphine Sulfate Anaphylaxis   Penicillins Hives, Itching, Other (See Comments)   Syncope, also   Nsaids Other (See Comments)   Because of an abnormal kidney test result   Penicillin V Hives   Shellfish Allergy Nausea And Vomiting, Other (See Comments)   Pt has shellfish allergy only.  Has had IV contrast x 2 and did fine.   Shellfish-derived Products Nausea And Vomiting        Medication List        Accurate as of September 15, 2022  3:13 PM. If you have any questions, ask your nurse or doctor.          STOP taking these medications    diclofenac Sodium 1 % Gel Commonly known as: VOLTAREN Stopped by:  E    melatonin 3 MG Tabs tablet Stopped by:  E    miconazole 2 % powder Commonly known as: MICOTIN Stopped by: Octavia Heir       TAKE these medications    acetaminophen 325 MG tablet Commonly known as: TYLENOL Take 650 mg by mouth every 4 (four) hours as needed for moderate pain or fever.   amLODipine 5 MG tablet Commonly known as: NORVASC Take 5 mg by mouth daily.   apixaban 5 MG Tabs tablet Commonly known as: ELIQUIS Take 1 tablet (5 mg total) by mouth 2 (two) times daily.   atorvastatin 40 MG tablet Commonly known as: LIPITOR Take 1 tablet (40 mg total) by mouth daily.   B-COMPLEX/FOLIC ACID/VITAMIN C PO Take 1 tablet by mouth daily with breakfast.   bimatoprost 0.03 % ophthalmic solution Commonly known as: LUMIGAN Place 1 drop into both eyes at bedtime.   cloNIDine 0.1 MG tablet Commonly known as: CATAPRES Take 0.1 mg by mouth daily as  needed (hypertension).   docusate sodium 100 MG capsule Commonly known as: COLACE Take 100 mg by mouth daily.   levothyroxine 25 MCG tablet Commonly known as: SYNTHROID Take 25 mcg by mouth daily before breakfast.   Myrbetriq 25 MG Tb24 tablet Generic drug: mirabegron ER Take 25 mg by mouth daily.   nystatin powder Commonly known as: MYCOSTATIN/NYSTOP Apply 1 application  topically 2 (two) times daily as needed (reason not listed).   pantoprazole 40 MG tablet Commonly known as: PROTONIX Take 40 mg by mouth daily.   polyethylene glycol 17 g packet Commonly known  as: MIRALAX / GLYCOLAX Take 17 g by mouth daily.   polyvinyl alcohol 1.4 % ophthalmic solution Commonly known as: LIQUIFILM TEARS Place 2 drops into both eyes in the morning, at noon, in the evening, and at bedtime.   sodium chloride 1 g tablet Take 1 g by mouth 2 (two) times daily.   Vitamin D3 50 MCG (2000 UT) Tabs Take 2,000 Units by mouth in the morning.        Review of Systems  Unable to perform ROS: Dementia    Immunization History  Administered Date(s) Administered   Influenza Split 11/13/2012, 12/13/2012, 11/30/2013, 10/09/2014, 11/24/2018   Influenza, High Dose Seasonal PF 12/01/2013, 10/04/2014, 10/12/2015, 11/06/2016, 11/12/2017   Influenza,inj,Quad PF,6+ Mos 10/24/2010   Influenza-Unspecified 10/24/2010, 11/01/2019, 12/04/2020   Moderna Covid-19 Vaccine Bivalent Booster 20yrs & up 07/24/2021   Moderna SARS-COV2 Booster Vaccination 07/18/2020   Moderna Sars-Covid-2 Vaccination 02/14/2019, 03/14/2019, 12/26/2019   PFIZER(Purple Top)SARS-COV-2 Vaccination 10/31/2020   PPD Test 11/27/2021   Pfizer Covid-19 Vaccine Bivalent Booster 37yrs & up 10/31/2020   Pneumococcal Conjugate-13 09/28/2013   Pneumococcal Polysaccharide-23 03/05/2012   Td 10/16/2010   Td (Adult) 08/27/2000   Tdap 08/27/2000, 10/16/2010   Zoster, Live 01/11/2013, 03/31/2020   Pertinent  Health Maintenance Due  Topic Date  Due   INFLUENZA VACCINE  09/11/2022   DEXA SCAN  Completed      02/12/2022    4:30 PM 03/25/2022   11:07 AM 04/03/2022    3:37 PM 04/18/2022    9:19 AM 07/04/2022   10:17 AM  Fall Risk  Falls in the past year? 0 0 0 0 1  Was there an injury with Fall? 0 0 0 0 0  Fall Risk Category Calculator 0 0 0 0 2  Fall Risk Category (Retired) Low      (RETIRED) Patient Fall Risk Level High fall risk      Patient at Risk for Falls Due to History of fall(s);Impaired balance/gait;Impaired mobility;Mental status change History of fall(s);Impaired balance/gait;Impaired mobility;Mental status change History of fall(s);Impaired balance/gait;Impaired mobility;Mental status change History of fall(s);Impaired balance/gait;Impaired mobility;Mental status change History of fall(s);Impaired balance/gait;Impaired mobility;Mental status change  Fall risk Follow up Falls evaluation completed Falls evaluation completed Falls evaluation completed Falls evaluation completed Falls evaluation completed   Functional Status Survey:    Vitals:   09/15/22 1508  BP: (!) 140/90  Pulse: 80  Resp: 18  Temp: 98.2 F (36.8 C)  SpO2: 96%  Weight: 185 lb 9.6 oz (84.2 kg)  Height: 5\' 3"  (1.6 m)   Body mass index is 32.88 kg/m. Physical Exam Vitals reviewed.  Constitutional:      General: She is not in acute distress. HENT:     Head: Normocephalic.     Right Ear: There is no impacted cerumen.     Left Ear: There is no impacted cerumen.     Nose: Nose normal.     Mouth/Throat:     Mouth: Mucous membranes are moist.     Pharynx: No posterior oropharyngeal erythema.  Eyes:     General:        Right eye: No discharge.        Left eye: No discharge.  Cardiovascular:     Rate and Rhythm: Normal rate. Rhythm irregular.     Pulses: Normal pulses.     Heart sounds: Normal heart sounds.  Pulmonary:     Effort: Pulmonary effort is normal. No respiratory distress.     Breath sounds: Normal breath sounds.  No wheezing.   Abdominal:     General: Bowel sounds are normal.     Palpations: Abdomen is soft.  Musculoskeletal:     Cervical back: Neck supple.     Right lower leg: Edema present.     Left lower leg: Edema present.     Comments: Non pitting  Lymphadenopathy:     Cervical: No cervical adenopathy.  Skin:    General: Skin is warm.     Capillary Refill: Capillary refill takes less than 2 seconds.  Neurological:     General: No focal deficit present.     Mental Status: She is alert. Mental status is at baseline.     Motor: Weakness present.     Gait: Gait abnormal.  Psychiatric:        Mood and Affect: Mood normal.     Labs reviewed: Recent Labs    11/26/21 0420 01/22/22 0925 04/08/22 0755 08/31/22 1134  NA 135 138 139 136  138  K 3.7 4.5 4.3 3.9  4.3  CL 104 104 104 104  102  CO2 23 25 24* 24  GLUCOSE 109* 125*  --  108*  105*  BUN 29* 21 24* 21  26*  CREATININE 1.23* 1.23* 1.2* 1.17*  1.30*  CALCIUM 8.7* 9.3 9.3 8.7*   Recent Labs    11/24/21 1023 01/22/22 0925 08/31/22 1134  AST 23 26 27   ALT 16 16 19   ALKPHOS 67 68 83  BILITOT 0.6 0.6 0.7  PROT 8.5* 7.1 7.2  ALBUMIN 4.2 3.5 3.4*   Recent Labs    11/26/21 0420 01/22/22 0925 04/08/22 0755 08/31/22 1134  WBC 8.9 7.8 7.3 9.0  NEUTROABS  --  6.3 3,833.00 7.0  HGB 12.7 12.6 12.4 12.7  13.6  HCT 40.1 40.6 38 40.3  40.0  MCV 84.2 84.1  --  85.7  PLT 202 191 224 227   Lab Results  Component Value Date   TSH 3.19 03/27/2022   Lab Results  Component Value Date   HGBA1C 5.7 (H) 11/24/2021   Lab Results  Component Value Date   CHOL 181 11/25/2021   HDL 37 (L) 11/25/2021   LDLCALC 127 (H) 11/25/2021   TRIG 85 11/25/2021   CHOLHDL 4.9 11/25/2021    Significant Diagnostic Results in last 30 days:  CT HEAD WO CONTRAST  Result Date: 08/31/2022 CLINICAL DATA:  Syncope/presyncope with cerebrovascular cause suspected. EXAM: CT HEAD WITHOUT CONTRAST TECHNIQUE: Contiguous axial images were obtained from the  base of the skull through the vertex without intravenous contrast. RADIATION DOSE REDUCTION: This exam was performed according to the departmental dose-optimization program which includes automated exposure control, adjustment of the mA and/or kV according to patient size and/or use of iterative reconstruction technique. COMPARISON:  Head CT 01/22/2022 and brain MRI from same date FINDINGS: Brain: No evidence of acute infarction, hemorrhage, hydrocephalus, extra-axial collection or mass lesion/mass effect. Extensive chronic small vessel ischemia in the cerebral white matter. Small chronic left parietal cortex infarct confirmed on brain MRI. Generalized brain atrophy. Vascular: No hyperdense vessel or unexpected calcification. Skull: Normal. Negative for fracture or focal lesion. Sinuses/Orbits: Chronic, partial left sphenoid sinus opacification. Negative orbits. IMPRESSION: 1. No acute or interval finding. 2. Atrophy and extensive chronic small vessel ischemia. Electronically Signed   By: Tiburcio Pea M.D.   On: 08/31/2022 12:11    Assessment/Plan 1. COVID-19 - positive test today - asymptomatic - lung sounds clear, afebrile - do not recommend paxlovid  - will start  supplements vitamin C and zinc, already on vit D - encourage hydration  - cont covid isolation precautions - Ascorbic Acid (VITAMIN C) 1000 MG tablet; Take 1 tablet (1,000 mg total) by mouth daily for 7 days. - zinc gluconate 50 MG tablet; Take 1 tablet (50 mg total) by mouth daily for 7 days.   Family/ staff Communication:   Labs/tests ordered:

## 2022-09-29 ENCOUNTER — Encounter: Payer: Self-pay | Admitting: Nurse Practitioner

## 2022-09-29 ENCOUNTER — Non-Acute Institutional Stay: Payer: Self-pay | Admitting: Nurse Practitioner

## 2022-09-29 DIAGNOSIS — I4892 Unspecified atrial flutter: Secondary | ICD-10-CM

## 2022-09-29 DIAGNOSIS — I10 Essential (primary) hypertension: Secondary | ICD-10-CM

## 2022-09-29 DIAGNOSIS — M159 Polyosteoarthritis, unspecified: Secondary | ICD-10-CM

## 2022-09-29 DIAGNOSIS — I639 Cerebral infarction, unspecified: Secondary | ICD-10-CM | POA: Diagnosis not present

## 2022-09-29 DIAGNOSIS — N1831 Chronic kidney disease, stage 3a: Secondary | ICD-10-CM | POA: Diagnosis not present

## 2022-09-29 DIAGNOSIS — F0390 Unspecified dementia without behavioral disturbance: Secondary | ICD-10-CM

## 2022-09-29 DIAGNOSIS — E039 Hypothyroidism, unspecified: Secondary | ICD-10-CM

## 2022-09-29 DIAGNOSIS — K5901 Slow transit constipation: Secondary | ICD-10-CM

## 2022-09-29 DIAGNOSIS — R350 Frequency of micturition: Secondary | ICD-10-CM

## 2022-09-29 DIAGNOSIS — E871 Hypo-osmolality and hyponatremia: Secondary | ICD-10-CM

## 2022-09-29 DIAGNOSIS — R55 Syncope and collapse: Secondary | ICD-10-CM

## 2022-09-29 NOTE — Progress Notes (Unsigned)
Location:   [facility] Home Room Number: 43-A Place of Service:  SNF (31) Provider:  Sipriano Fendley, NP    Patient Care Team: Germaine Shenker X, NP as PCP - General (Internal Medicine) [name], MD as PCP - Cardiology (Cardiology) Olivia Mackie, MD as Consulting Physician (Obstetrics and Gynecology) De Blanch, MD as Consulting Physician (Gynecology) Cleda Mccreedy, MD as Consulting Physician (Gynecologic Oncology)  Extended Emergency Contact Information Primary Emergency Contact: [facility] Address: [address]          [place], [place] [address] D[place] of [place] Home Phone: [phone] Mobile Phone: [phone] Relation: Daughter  Code Status:  FULL CODE Goals of care: Advanced Directive information    09/29/2022    9:55 AM  Advanced Directives  Does Patient Have a Medical Advance Directive? Yes  Type of Estate agent of [place];Living will  Does patient want to make changes to medical advance directive? No - Patient declined  Copy of Healthcare Power of Attorney in Chart? Yes - validated most recent copy scanned in chart (See row information)     Chief Complaint  Patient presents with   Medical Management of Chronic Issues    Routine Visit.    Immunizations    Discuss the need for DTAP vaccine, Covid Booster, and Influenza vaccine.   Health Maintenance    Discuss the need for AWV.    HPI:  Pt is a 89 y.o. female seen today for medical management of chronic diseases.      11/24/21 acute CVA left PCA embolic strokes, no apparent residual symptoms, on Eliquis f/u Neurology             CKD, Bun/creat 21/1.17 08/31/22             Hypothyroidism, takes Levothyroxine, TSH 3.19 03/27/22             Hyponatremia, Na 136 08/31/22             Syncope: Orthostasis ?, vasovagal episode 08/31/22,  no further recurrence              A flutter, f/u Cardiology, off anticoagulation 2/2 falls in the past,  restarted Eliquis after acute CVA 11/24/21             Hx of ovarian CA s/p TAH/BSO 03/2011, in remission.             Senile dementia,  SNF [facility] for supportive care, 01/29/22 MMSE 23/30             Constipation takes Colace. MiraLax qod             HTN, takes Amlodipine, prn Clonidine             OP, takes Ca, Vit D             Insomnia, off Ambien, off Melatonin.              Hyperlipidemia, on Atorvastatin, LDL 127 11/25/21             OA, chronic R ankle pain, uses ankle brace,  ambulates with walker slowly,  w/c to go further             Urinary frequency, on Mirabegron     Past Medical History:  Diagnosis Date   Anemia associated with acute blood loss 04/03/2011   Arthritis    knees    Ascites    Blood transfusion    hx of 2011  Complication of anesthesia    Sodium drops per pt    Cystitis    DIARRHEA, ANTIBIOTIC ASSOCIATED 05/31/2009   Qualifier: Diagnosis of  By: Daiva Eves MD, Remi Haggard     GERD (gastroesophageal reflux disease)    H/O hiatal hernia    Hyperlipidemia    Hypertension    Hyponatremia    Neutropenia with fever (HCC) 05/18/2011   OSTEOMYELITIS, CHRONIC, LOWER LEG 04/23/2009   Qualifier: Diagnosis of  By: Ninetta Lights MD, Linus Mako    OSTEOPOROSIS 04/23/2009   Qualifier: Diagnosis of  By: Ninetta Lights MD, Jeffrey     Ovarian cancer (HCC) 01/25/2011   Pneumonia    hx of    Postmenopausal atrophic vaginitis    PROSTHETIC JOINT COMPLICATION 05/28/2009   Qualifier: Diagnosis of  By: Daiva Eves MD, Cornelius     Renal disorder    Decreased kidney function   Staph infection 2010   after knee replacement   Urinary frequency    Vulvitis    Past Surgical History:  Procedure Laterality Date   ABDOMINAL HYSTERECTOMY  04/01/2011   Procedure: HYSTERECTOMY ABDOMINAL;  Surgeon: Jeannette Corpus, MD;  Location: [facility];  Service: Gynecology;  Laterality: N/A;   APPENDECTOMY     JOINT REPLACEMENT     R knee in 2008, 5 operations on L knee   LAPAROTOMY   04/01/2011   Procedure: EXPLORATORY LAPAROTOMY;  Surgeon: Jeannette Corpus, MD;  Location: [facility];  Service: Gynecology;  Laterality: N/A;   OTHER SURGICAL HISTORY     hx of C section 1966   SALPINGOOPHORECTOMY  04/01/2011   Procedure: SALPINGO OOPHERECTOMY;  Surgeon: Jeannette Corpus, MD;  Location: [facility];  Service: Gynecology;  Laterality: Bilateral;   TONSILLECTOMY      Allergies  Allergen Reactions   Morphine And Codeine Anaphylaxis and Other (See Comments)    Given after knee replacement and code blue occurred   Morphine Sulfate Anaphylaxis   Penicillins Hives, Itching and Other (See Comments)    Syncope, also   Nsaids Other (See Comments)    Because of an abnormal kidney test result   Penicillin V Hives   Shellfish Allergy Nausea And Vomiting and Other (See Comments)    Pt has shellfish allergy only.  Has had IV contrast x 2 and did fine.   Shellfish-Derived Products Nausea And Vomiting    Allergies as of 09/29/2022       Reactions   Morphine And Codeine Anaphylaxis, Other (See Comments)   Given after knee replacement and code blue occurred   Morphine Sulfate Anaphylaxis   Penicillins Hives, Itching, Other (See Comments)   Syncope, also   Nsaids Other (See Comments)   Because of an abnormal kidney test result   Penicillin V Hives   Shellfish Allergy Nausea And Vomiting, Other (See Comments)   Pt has shellfish allergy only.  Has had IV contrast x 2 and did fine.   Shellfish-derived Products Nausea And Vomiting        Medication List        Accurate as of September 29, 2022 12:08 PM. If you have any questions, ask your nurse or doctor.          acetaminophen 325 MG tablet Commonly known as: TYLENOL Take 650 mg by mouth every 4 (four) hours as needed for moderate pain or fever.   amLODipine 5 MG tablet Commonly known as: NORVASC Take 5 mg by mouth daily.   apixaban 5 MG  Tabs tablet Commonly known as: ELIQUIS Take 1 tablet (5 mg total) by mouth  2 (two) times daily.   atorvastatin 40 MG tablet Commonly known as: LIPITOR Take 1 tablet (40 mg total) by mouth daily.   B-COMPLEX/FOLIC ACID/VITAMIN C PO Take 1 tablet by mouth daily with breakfast.   bimatoprost 0.03 % ophthalmic solution Commonly known as: LUMIGAN Place 1 drop into both eyes at bedtime.   cloNIDine 0.1 MG tablet Commonly known as: CATAPRES Take 0.1 mg by mouth daily as needed (hypertension).   docusate sodium 100 MG capsule Commonly known as: COLACE Take 100 mg by mouth daily.   levothyroxine 25 MCG tablet Commonly known as: SYNTHROID Take 25 mcg by mouth daily before breakfast.   Myrbetriq 25 MG Tb24 tablet Generic drug: mirabegron ER Take 25 mg by mouth daily.   nystatin powder Commonly known as: MYCOSTATIN/NYSTOP Apply 1 application  topically 2 (two) times daily as needed (reason not listed).   pantoprazole 40 MG tablet Commonly known as: PROTONIX Take 40 mg by mouth daily.   polyethylene glycol 17 g packet Commonly known as: MIRALAX / GLYCOLAX Take 17 g by mouth daily.   polyvinyl alcohol 1.4 % ophthalmic solution Commonly known as: LIQUIFILM TEARS Place 2 drops into both eyes in the morning, at noon, in the evening, and at bedtime.   sodium chloride 1 g tablet Take 1 g by mouth 2 (two) times daily.   Vitamin D3 50 MCG (2000 UT) Tabs Take 2,000 Units by mouth in the morning.        Review of Systems  Constitutional:  Negative for appetite change and fever.  HENT:  Positive for hearing loss. Negative for congestion and trouble swallowing.   Eyes:  Negative for visual disturbance.  Respiratory:  Negative for shortness of breath.   Cardiovascular:  Negative for leg swelling.  Gastrointestinal:  Negative for abdominal pain and constipation.  Genitourinary:  Positive for frequency. Negative for difficulty urinating, dysuria, flank pain and urgency.  Musculoskeletal:  Positive for arthralgias, back pain and gait problem.       R  ankle pain is chronic  Skin:  Negative for color change.  Neurological:  Negative for speech difficulty, weakness and light-headedness.       Memory deficit  Psychiatric/Behavioral:  Positive for confusion. Negative for sleep disturbance. The patient is not nervous/anxious.     Immunization History  Administered Date(s) Administered   Influenza Split 11/13/2012, 12/13/2012, 11/30/2013, 10/09/2014, 11/24/2018   Influenza, High Dose Seasonal PF 12/01/2013, 10/04/2014, 10/12/2015, 11/06/2016, 11/12/2017   Influenza,inj,Quad PF,6+ Mos 10/24/2010   Influenza-Unspecified 10/24/2010, 11/01/2019, 12/04/2020   Moderna Covid-19 Vaccine Bivalent Booster 33yrs & up 07/24/2021   Moderna SARS-COV2 Booster Vaccination 07/18/2020   Moderna Sars-Covid-2 Vaccination 02/14/2019, 03/14/2019, 12/26/2019   PFIZER(Purple Top)SARS-COV-2 Vaccination 10/31/2020   PPD Test 11/27/2021   Pfizer Covid-19 Vaccine Bivalent Booster 31yrs & up 10/31/2020   Pneumococcal Conjugate-13 09/28/2013   Pneumococcal Polysaccharide-23 03/05/2012   Td 10/16/2010   Td (Adult) 08/27/2000   Tdap 08/27/2000, 10/16/2010   Zoster, Live 01/11/2013, 03/31/2020   Pertinent  Health Maintenance Due  Topic Date Due   INFLUENZA VACCINE  09/11/2022   DEXA SCAN  Completed      02/12/2022    4:30 PM 03/25/2022   11:07 AM 04/03/2022    3:37 PM 04/18/2022    9:19 AM 07/04/2022   10:17 AM  Fall Risk  Falls in the past year? 0 0 0 0 1  Was there an  injury with Fall? 0 0 0 0 0  Fall Risk Category Calculator 0 0 0 0 2  Fall Risk Category (Retired) Low      (RETIRED) Patient Fall Risk Level High fall risk      Patient at Risk for Falls Due to History of fall(s);Impaired balance/gait;Impaired mobility;Mental status change History of fall(s);Impaired balance/gait;Impaired mobility;Mental status change History of fall(s);Impaired balance/gait;Impaired mobility;Mental status change History of fall(s);Impaired balance/gait;Impaired mobility;Mental  status change History of fall(s);Impaired balance/gait;Impaired mobility;Mental status change  Fall risk Follow up Falls evaluation completed Falls evaluation completed Falls evaluation completed Falls evaluation completed Falls evaluation completed   Functional Status Survey:    Vitals:   09/29/22 0952  BP: 139/71  Pulse: 83  Resp: 16  Temp: (!) 95.8 F (35.4 C)  SpO2: 96%  Weight: 185 lb 9.6 oz (84.2 kg)  Height: 5\' 3"  (1.6 m)   Body mass index is 32.88 kg/m. Physical Exam Vitals and nursing note reviewed.  Constitutional:      Appearance: Normal appearance.  HENT:     Head: Normocephalic and atraumatic.     Mouth/Throat:     Mouth: Mucous membranes are moist.  Eyes:     Extraocular Movements: Extraocular movements intact.     Conjunctiva/sclera: Conjunctivae normal.     Pupils: Pupils are equal, round, and reactive to light.  Cardiovascular:     Rate and Rhythm: Normal rate. Rhythm irregular.     Heart sounds: No murmur heard. Pulmonary:     Effort: Pulmonary effort is normal.     Breath sounds: No rales.  Abdominal:     General: Bowel sounds are normal.     Palpations: Abdomen is soft.     Tenderness: There is no abdominal tenderness.     Hernia: A hernia is present.     Comments: Incision hernia  Musculoskeletal:        General: Tenderness present.     Cervical back: Normal range of motion and neck supple.     Right lower leg: No edema.     Left lower leg: No edema.     Comments: Right ankle brace, chronic pain. New right leg pain, not able to specify the location, stopped using walker to ambulate, no redness or deformity noted.   Skin:    General: Skin is warm and dry.     Comments: Scattered scratched spots upper back, neck, no apparent rash, the patient stated she scratched because of itching.   Neurological:     General: No focal deficit present.     Mental Status: She is alert. Mental status is at baseline.     Motor: No weakness.     Gait: Gait  abnormal.     Comments: Oriented to person, time  Psychiatric:        Mood and Affect: Mood normal.        Thought Content: Thought content normal.     Comments: Confused her locations.      Labs reviewed: Recent Labs    11/26/21 0420 01/22/22 0925 04/08/22 0755 08/31/22 1134  NA 135 138 139 136  138  K 3.7 4.5 4.3 3.9  4.3  CL 104 104 104 104  102  CO2 23 25 24* 24  GLUCOSE 109* 125*  --  108*  105*  BUN 29* 21 24* 21  26*  CREATININE 1.23* 1.23* 1.2* 1.17*  1.30*  CALCIUM 8.7* 9.3 9.3 8.7*   Recent Labs    11/24/21 1023 01/22/22 0925  08/31/22 1134  AST 23 26 27   ALT 16 16 19   ALKPHOS 67 68 83  BILITOT 0.6 0.6 0.7  PROT 8.5* 7.1 7.2  ALBUMIN 4.2 3.5 3.4*   Recent Labs    11/26/21 0420 01/22/22 0925 04/08/22 0755 08/31/22 1134  WBC 8.9 7.8 7.3 9.0  NEUTROABS  --  6.3 3,833.00 7.0  HGB 12.7 12.6 12.4 12.7  13.6  HCT 40.1 40.6 38 40.3  40.0  MCV 84.2 84.1  --  85.7  PLT 202 191 224 227   Lab Results  Component Value Date   TSH 3.19 03/27/2022   Lab Results  Component Value Date   HGBA1C 5.7 (H) 11/24/2021   Lab Results  Component Value Date   CHOL 181 11/25/2021   HDL 37 (L) 11/25/2021   LDLCALC 127 (H) 11/25/2021   TRIG 85 11/25/2021   CHOLHDL 4.9 11/25/2021    Significant Diagnostic Results in last 30 days:  CT HEAD WO CONTRAST  Result Date: 08/31/2022 CLINICAL DATA:  Syncope/presyncope with cerebrovascular cause suspected. EXAM: CT HEAD WITHOUT CONTRAST TECHNIQUE: Contiguous axial images were obtained from the base of the skull through the vertex without intravenous contrast. RADIATION DOSE REDUCTION: This exam was performed according to the departmental dose-optimization program which includes automated exposure control, adjustment of the mA and/or kV according to patient size and/or use of iterative reconstruction technique. COMPARISON:  Head CT 01/22/2022 and brain MRI from same date FINDINGS: Brain: No evidence of acute infarction,  hemorrhage, hydrocephalus, extra-axial collection or mass lesion/mass effect. Extensive chronic small vessel ischemia in the cerebral white matter. Small chronic left parietal cortex infarct confirmed on brain MRI. Generalized brain atrophy. Vascular: No hyperdense vessel or unexpected calcification. Skull: Normal. Negative for fracture or focal lesion. Sinuses/Orbits: Chronic, partial left sphenoid sinus opacification. Negative orbits. IMPRESSION: 1. No acute or interval finding. 2. Atrophy and extensive chronic small vessel ischemia. Electronically Signed   By: Tiburcio Pea M.D.   On: 08/31/2022 12:11    Assessment/Plan CKD (chronic kidney disease) stage 3, GFR 30-59 ml/min (HCC) Bun/creat 21/1.17 08/31/22  Hypothyroidism  takes Levothyroxine, TSH 3.19 03/27/22  Hyponatremia Na 136 08/31/22  Stroke (cerebrum) (HCC) 11/24/21 acute CVA left PCA embolic strokes, no apparent residual symptoms, on Eliquis f/u Neurology  Syncope Orthostasis ?, vasovagal episode 08/31/22,  no further recurrence   Atrial flutter (HCC)  f/u Cardiology, off anticoagulation 2/2 falls in the past, restarted Eliquis after acute CVA 11/24/21  Senile dementia (HCC)  SNF [facility] for supportive care, 01/29/22 MMSE 23/30  Slow transit constipation Stable, takes Colace. MiraLax qod  Essential hypertension Blood pressure is controlled, takes Amlodipine, prn Clonidine  Osteoarthritis chronic R ankle pain, uses ankle brace,  ambulates with walker slowly,  w/c to go further     Family/ staff Communication: plan of care reviewed with the patient and charge nurse.   Labs/tests ordered:  none  Time spend 35 minutes.

## 2022-09-29 NOTE — Assessment & Plan Note (Signed)
 Na 136 08/31/22

## 2022-09-29 NOTE — Assessment & Plan Note (Signed)
Stable, takes Colace. MiraLax qod

## 2022-09-29 NOTE — Assessment & Plan Note (Signed)
Blood pressure is controlled, takes Amlodipine, prn Clonidine

## 2022-09-29 NOTE — Assessment & Plan Note (Signed)
takes Levothyroxine, TSH 3.19 03/27/22 

## 2022-09-29 NOTE — Assessment & Plan Note (Signed)
 Bun/creat 21/1.17 08/31/22

## 2022-09-29 NOTE — Assessment & Plan Note (Signed)
Orthostasis ?, vasovagal episode 08/31/22,  no further recurrence

## 2022-09-29 NOTE — Assessment & Plan Note (Signed)
f/u Cardiology, off anticoagulation 2/2 falls in the past, restarted Eliquis after acute CVA 11/24/21 

## 2022-09-29 NOTE — Assessment & Plan Note (Signed)
11/24/21 acute CVA left PCA embolic strokes, no apparent residual symptoms, on Eliquis f/u Neurology

## 2022-09-29 NOTE — Assessment & Plan Note (Signed)
chronic R ankle pain, uses ankle brace, ambulates with walker slowly,  w/c to go further 

## 2022-09-29 NOTE — Assessment & Plan Note (Signed)
No change, on Mirabegron 

## 2022-09-29 NOTE — Assessment & Plan Note (Signed)
SNF [facility] for supportive care, 01/29/22 MMSE 23/30 

## 2022-10-01 ENCOUNTER — Non-Acute Institutional Stay (INDEPENDENT_AMBULATORY_CARE_PROVIDER_SITE_OTHER): Payer: Medicare Other | Admitting: Nurse Practitioner

## 2022-10-01 ENCOUNTER — Encounter: Payer: Self-pay | Admitting: Nurse Practitioner

## 2022-10-01 DIAGNOSIS — Z0000 Encounter for general adult medical examination without abnormal findings: Secondary | ICD-10-CM

## 2022-10-01 NOTE — Progress Notes (Signed)
Subjective:   [name] is a 89 y.o. female who presents for Medicare Annual (Subsequent) preventive examination.  Visit Complete: In person  Patient Medicare AWV questionnaire was completed by the patient on 10/01/22; I have confirmed that all information answered by patient is correct and no changes since this date.   Cardiac Risk Factors include: advanced age (>94men, >48 women);dyslipidemia;hypertension;obesity (BMI >30kg/m2);sedentary lifestyle     Objective:    Today's Vitals   10/01/22 1231  BP: 133/81  Pulse: 83  Resp: 17  Temp: (!) 97.2 F (36.2 C)  SpO2: 97%  Weight: 185 lb 9.6 oz (84.2 kg)   Body mass index is 32.88 kg/m.     09/29/2022    9:55 AM 07/16/2022   12:20 PM 07/04/2022   10:16 AM 05/12/2022    4:16 PM 04/18/2022    9:20 AM 04/03/2022    3:37 PM 03/25/2022   11:08 AM  Advanced Directives  Does Patient Have a Medical Advance Directive? Yes Yes Yes Yes Yes Yes Yes  Type of Estate agent of [place];Living will Healthcare Power of [place];Living will Healthcare Power of [place];Living will Healthcare Power of [place];Living will Healthcare Power of [place];Living will Healthcare Power of [place];Living will Healthcare Power of [place];Living will  Does patient want to make changes to medical advance directive? No - Patient declined No - Patient declined No - Patient declined No - Patient declined No - Patient declined No - Patient declined No - Patient declined  Copy of Healthcare Power of Attorney in Chart? Yes - validated most recent copy scanned in chart (See row information) Yes - validated most recent copy scanned in chart (See row information) Yes - validated most recent copy scanned in chart (See row information) Yes - validated most recent copy scanned in chart (See row information) Yes - validated most recent copy scanned in chart (See row information) Yes - validated most recent copy scanned in chart (See row information)  Yes - validated most recent copy scanned in chart (See row information)    Current Medications (verified) Outpatient Encounter Medications as of 10/01/2022  Medication Sig   acetaminophen (TYLENOL) 325 MG tablet Take 650 mg by mouth every 4 (four) hours as needed for moderate pain or fever.   amLODipine (NORVASC) 5 MG tablet Take 5 mg by mouth daily.   apixaban (ELIQUIS) 5 MG TABS tablet Take 1 tablet (5 mg total) by mouth 2 (two) times daily.   atorvastatin (LIPITOR) 40 MG tablet Take 1 tablet (40 mg total) by mouth daily.   B Complex-C-Folic Acid (B-COMPLEX/FOLIC ACID/VITAMIN C PO) Take 1 tablet by mouth daily with breakfast.   bimatoprost (LUMIGAN) 0.03 % ophthalmic solution Place 1 drop into both eyes at bedtime.   Cholecalciferol (VITAMIN D3) 50 MCG (2000 UT) TABS Take 2,000 Units by mouth in the morning.   cloNIDine (CATAPRES) 0.1 MG tablet Take 0.1 mg by mouth daily as needed (hypertension).   docusate sodium (COLACE) 100 MG capsule Take 100 mg by mouth daily.   levothyroxine (SYNTHROID) 25 MCG tablet Take 25 mcg by mouth daily before breakfast.   mirabegron ER (MYRBETRIQ) 25 MG TB24 tablet Take 25 mg by mouth daily.   nystatin (MYCOSTATIN/NYSTOP) powder Apply 1 application  topically 2 (two) times daily as needed (reason not listed).   pantoprazole (PROTONIX) 40 MG tablet Take 40 mg by mouth daily.   polyethylene glycol (MIRALAX / GLYCOLAX) 17 g packet Take 17 g by mouth daily.   polyvinyl  alcohol (LIQUIFILM TEARS) 1.4 % ophthalmic solution Place 2 drops into both eyes in the morning, at noon, in the evening, and at bedtime.   sodium chloride 1 g tablet Take 1 g by mouth 2 (two) times daily.   No facility-administered encounter medications on file as of 10/01/2022.    Allergies (verified) Morphine and codeine, Morphine sulfate, Penicillins, Nsaids, Penicillin v, Shellfish allergy, and Shellfish-derived products   History: Past Medical History:  Diagnosis Date   Anemia associated  with acute blood loss 04/03/2011   Arthritis    knees    Ascites    Blood transfusion    hx of 2011    Complication of anesthesia    Sodium drops per pt    Cystitis    DIARRHEA, ANTIBIOTIC ASSOCIATED 05/31/2009   Qualifier: Diagnosis of  By: Daiva Eves MD, Cornelius     GERD (gastroesophageal reflux disease)    H/O hiatal hernia    Hyperlipidemia    Hypertension    Hyponatremia    Neutropenia with fever (HCC) 05/18/2011   OSTEOMYELITIS, CHRONIC, LOWER LEG 04/23/2009   Qualifier: Diagnosis of  By: Ninetta Lights MD, Linus Mako    OSTEOPOROSIS 04/23/2009   Qualifier: Diagnosis of  By: Ninetta Lights MD, Jeffrey     Ovarian cancer (HCC) 01/25/2011   Pneumonia    hx of    Postmenopausal atrophic vaginitis    PROSTHETIC JOINT COMPLICATION 05/28/2009   Qualifier: Diagnosis of  By: Daiva Eves MD, Cornelius     Renal disorder    Decreased kidney function   Staph infection 2010   after knee replacement   Urinary frequency    Vulvitis    Past Surgical History:  Procedure Laterality Date   ABDOMINAL HYSTERECTOMY  04/01/2011   Procedure: HYSTERECTOMY ABDOMINAL;  Surgeon: Jeannette Corpus, MD;  Location: [facility];  Service: Gynecology;  Laterality: N/A;   APPENDECTOMY     JOINT REPLACEMENT     R knee in 2008, 5 operations on L knee   LAPAROTOMY  04/01/2011   Procedure: EXPLORATORY LAPAROTOMY;  Surgeon: Jeannette Corpus, MD;  Location: [facility];  Service: Gynecology;  Laterality: N/A;   OTHER SURGICAL HISTORY     hx of C section 1966   SALPINGOOPHORECTOMY  04/01/2011   Procedure: SALPINGO OOPHERECTOMY;  Surgeon: Jeannette Corpus, MD;  Location: [facility];  Service: Gynecology;  Laterality: Bilateral;   TONSILLECTOMY     Family History  Problem Relation Age of Onset   Cancer Other        Bladder cancer   Heart attack Brother    Diabetes Brother    Social History   Socioeconomic History   Marital status: Widowed    Spouse name: Not on file   Number of children: Not on file    Years of education: Not on file   Highest education level: Not on file  Occupational History   Not on file  Tobacco Use   Smoking status: Former    Current packs/day: 0.00    Types: Cigarettes    Quit date: 02/11/1952    Years since quitting: 70.6   Smokeless tobacco: Never  Vaping Use   Vaping status: Never Used  Substance and Sexual Activity   Alcohol use: Not Currently   Drug use: Never   Sexual activity: Never  Other Topics Concern   Not on file  Social History Narrative   Not on file   Social Determinants of Corporate investment banker  Strain: Low Risk  (08/23/2021)   Overall Financial Resource Strain (CARDIA)    Difficulty of Paying Living Expenses: Not hard at all  Food Insecurity: No Food Insecurity (11/26/2021)   Hunger Vital Sign    Worried About Running Out of Food in the Last Year: Never true    Ran Out of Food in the Last Year: Never true  Transportation Needs: No Transportation Needs (11/26/2021)   PRAPARE - Administrator, Civil Service (Medical): No    Lack of Transportation (Non-Medical): No  Physical Activity: Insufficiently Active (08/23/2021)   Exercise Vital Sign    Days of Exercise per Week: 7 days    Minutes of Exercise per Session: 10 min  Stress: No Stress Concern Present (08/16/2020)   [employer] of Occupational Health - Occupational Stress Questionnaire    Feeling of Stress : Only a little  Social Connections: Socially Isolated (08/23/2021)   Social Connection and Isolation Panel [NHANES]    Frequency of Communication with Friends and Family: More than three times a week    Frequency of Social Gatherings with Friends and Family: Three times a week    Attends Religious Services: Never    Active Member of Clubs or Organizations: No    Attends Banker Meetings: Never    Marital Status: Widowed    Tobacco Counseling Counseling given: Not Answered   Clinical Intake:  Pre-visit preparation completed: Yes  Pain  : No/denies pain     BMI - recorded: 32.88 Nutritional Status: BMI > 30  Obese Nutritional Risks: Unintentional weight loss (weight loss about #10Ibs in the past year, desirable.) Diabetes: No  How often do you need to have someone help you when you read instructions, pamphlets, or other written materials from your doctor or pharmacy?: 4 - Often What is the last grade level you completed in school?: high school  Interpreter Needed?: No  Information entered by :: Evva Din Nedra Hai NP   Activities of Daily Living    10/01/2022   12:39 PM 11/26/2021    2:46 PM  In your present state of health, do you have any difficulty performing the following activities:  Hearing? 1   Vision? 0   Difficulty concentrating or making decisions? 1   Walking or climbing stairs? 1   Dressing or bathing? 1   Doing errands, shopping? 1 1  Preparing Food and eating ? N   Using the Toilet? Y   In the past six months, have you accidently leaked urine? Y   Do you have problems with loss of bowel control? N   Managing your Medications? Y   Managing your Finances? Y   Housekeeping or managing your Housekeeping? Y     Patient Care Team: Lidie Glade X, NP as PCP - General (Internal Medicine) [name], MD as PCP - Cardiology (Cardiology) Olivia Mackie, MD as Consulting Physician (Obstetrics and Gynecology) De Blanch, MD as Consulting Physician (Gynecology) Cleda Mccreedy, MD as Consulting Physician (Gynecologic Oncology)  Indicate any recent Medical Services you may have received from other than [facility] providers in the past year (date may be approximate).     Assessment:   This is a routine wellness examination for Makaylah.  Hearing/Vision screen No results found.  Dietary issues and exercise activities discussed:     Goals Addressed             This Visit's Progress    Maintain Mobility and Function  Evidence-based guidance:  Acknowledge and validate impact of  pain, loss of strength and potential disfigurement (hand osteoarthritis) on mental health and daily life, such as social isolation, anxiety, depression, impaired sexual relationship and   injury from falls.  Anticipate referral to physical or occupational therapy for assessment, therapeutic exercise and recommendation for adaptive equipment or assistive devices; encourage participation.  Assess impact on ability to perform activities of daily living, as well as engage in sports and leisure events or requirements of work or school.  Provide anticipatory guidance and reassurance about the benefit of exercise to maintain function; acknowledge and normalize fear that exercise may worsen symptoms.  Encourage regular exercise, at least 10 minutes at a time for 45 minutes per week; consider yoga, water exercise and proprioceptive exercises; encourage use of wearable activity tracker to increase motivation and adherence.  Encourage maintenance or resumption of daily activities, including employment, as pain allows and with minimal exposure to trauma.  Assist patient to advocate for adaptations to the work environment.  Consider level of pain and function, gender, age, lifestyle, patient preference, quality of life, readiness and ?ocapacity to benefit? when recommending patients for orthopaedic surgery consultation.  Explore strategies, such as changes to medication regimen or activity that enables patient to anticipate and manage flare-ups that increase deconditioning and disability.  Explore patient preferences; encourage exposure to a broader range of activities that have been avoided for fear of experiencing pain.  Identify barriers to participation in therapy or exercise, such as pain with activity, anticipated or imagined pain.  Monitor postoperative joint replacement or any preexisting joint replacement for ongoing pain and loss of function; provide social support and encouragement throughout recovery.    Notes:        Depression Screen    10/01/2022   12:41 PM 04/18/2022    9:19 AM 04/03/2022    3:37 PM 03/25/2022   11:07 AM 02/12/2022    4:35 PM 11/29/2021    2:37 PM 08/23/2021    1:06 PM  PHQ 2/9 Scores  PHQ - 2 Score 0 0 0 0 0 0 0    Fall Risk    07/04/2022   10:17 AM 04/18/2022    9:19 AM 04/03/2022    3:37 PM 03/25/2022   11:07 AM 02/12/2022    4:30 PM  Fall Risk   Falls in the past year? 1 0 0 0 0  Number falls in past yr: 1 0 0 0 0  Injury with Fall? 0 0 0 0 0  Risk for fall due to : History of fall(s);Impaired balance/gait;Impaired mobility;Mental status change History of fall(s);Impaired balance/gait;Impaired mobility;Mental status change History of fall(s);Impaired balance/gait;Impaired mobility;Mental status change History of fall(s);Impaired balance/gait;Impaired mobility;Mental status change History of fall(s);Impaired balance/gait;Impaired mobility;Mental status change  Follow up Falls evaluation completed Falls evaluation completed Falls evaluation completed Falls evaluation completed Falls evaluation completed    MEDICARE RISK AT HOME: Medicare Risk at Home Any stairs in or around the home?: Yes If so, are there any without handrails?: No Home free of loose throw rugs in walkways, pet beds, electrical cords, etc?: No Adequate lighting in your home to reduce risk of falls?: Yes Life alert?: No Use of a cane, walker or w/c?: Yes Grab bars in the bathroom?: Yes Shower chair or bench in shower?: Yes Elevated toilet seat or a handicapped toilet?: Yes  TIMED UP AND GO:  Was the test performed?  No    Cognitive Function:    10/17/2021   10:00 AM  08/23/2021    1:08 PM 08/16/2020    3:50 PM  MMSE - Mini Mental State Exam  Orientation to time 3 4 5   Orientation to Place 4 5 5   Registration 2 3 3   Attention/ Calculation 0 5 5  Recall 0 2 3  Language- name 2 objects 2 2 2   Language- repeat 1 1 1   Language- follow 3 step command 3 3 3   Language- read & follow  direction 1 1 1   Write a sentence 1 1 1   Copy design 1 1 1   Total score 18 28 30         08/23/2021    1:09 PM 08/16/2020    3:51 PM  6CIT Screen  What Year? 0 points 0 points  What month? 0 points 0 points  What time? 0 points 0 points  Count back from 20 0 points 0 points  Months in reverse 0 points 0 points  Repeat phrase 0 points 0 points  Total Score 0 points 0 points    Immunizations Immunization History  Administered Date(s) Administered   Influenza Split 11/13/2012, 12/13/2012, 11/30/2013, 10/09/2014, 11/24/2018   Influenza, High Dose Seasonal PF 12/01/2013, 10/04/2014, 10/12/2015, 11/06/2016, 11/12/2017   Influenza,inj,Quad PF,6+ Mos 10/24/2010   Influenza-Unspecified 10/24/2010, 11/01/2019, 12/04/2020   Moderna Covid-19 Vaccine Bivalent Booster 26yrs & up 07/24/2021   Moderna SARS-COV2 Booster Vaccination 07/18/2020   Moderna Sars-Covid-2 Vaccination 02/14/2019, 03/14/2019, 12/26/2019   PFIZER(Purple Top)SARS-COV-2 Vaccination 10/31/2020   PPD Test 11/27/2021   Pfizer Covid-19 Vaccine Bivalent Booster 11yrs & up 10/31/2020   Pneumococcal Conjugate-13 09/28/2013   Pneumococcal Polysaccharide-23 03/05/2012   Td 10/16/2010   Td (Adult) 08/27/2000   Tdap 08/27/2000, 10/16/2010   Zoster, Live 01/11/2013, 03/31/2020    TDAP status: Due, Education has been provided regarding the importance of this vaccine. Advised may receive this vaccine at local pharmacy or Health Dept. Aware to provide a copy of the vaccination record if obtained from local pharmacy or Health Dept. Verbalized acceptance and understanding.  Flu Vaccine status: Due, Education has been provided regarding the importance of this vaccine. Advised may receive this vaccine at local pharmacy or Health Dept. Aware to provide a copy of the vaccination record if obtained from local pharmacy or Health Dept. Verbalized acceptance and understanding.  Pneumococcal vaccine status: Completed during today's  visit.  Covid-19 vaccine status: Information provided on how to obtain vaccines.   Qualifies for Shingles Vaccine? Yes   Zostavax completed Yes   Shingrix Completed?: Yes  Screening Tests Health Maintenance  Topic Date Due   DTaP/Tdap/Td (5 - Td or Tdap) 10/15/2020   COVID-19 Vaccine (6 - 2023-24 season) 10/11/2021   INFLUENZA VACCINE  09/11/2022   Medicare Annual Wellness (AWV)  10/01/2023   Pneumonia Vaccine 29+ Years old  Completed   DEXA SCAN  Completed   HPV VACCINES  Aged Out   Zoster Vaccines- Shingrix  Discontinued    Health Maintenance  Health Maintenance Due  Topic Date Due   DTaP/Tdap/Td (5 - Td or Tdap) 10/15/2020   COVID-19 Vaccine (6 - 2023-24 season) 10/11/2021   INFLUENZA VACCINE  09/11/2022    Colorectal cancer screening: No longer required.   Mammogram status: No longer required due to aged out.  Bone Density status: Ordered 10/01/22. Pt provided with contact info and advised to call to schedule appt.  Lung Cancer Screening: (Low Dose CT Chest recommended if Age 15-80 years, 20 pack-year currently smoking OR have quit w/in 15years.) does not qualify.  Additional Screening:  Hepatitis C Screening: does not qualify;   Vision Screening: Recommended annual ophthalmology exams for early detection of glaucoma and other disorders of the eye. Is the patient up to date with their annual eye exam?  No  Who is the provider or what is the name of the office in which the patient attends annual eye exams? [facility] will provide if desires.  If pt is not established with a provider, would they like to be referred to a provider to establish care? No .   Dental Screening: Recommended annual dental exams for proper oral hygiene    Community Resource Referral / Chronic Care Management: CRR required this visit?  No   CCM required this visit?  No     Plan:     I have personally reviewed and noted the following in the patient's chart:   Medical and social  history Use of alcohol, tobacco or illicit drugs  Current medications and supplements including opioid prescriptions. Patient is not currently taking opioid prescriptions. Functional ability and status Nutritional status Physical activity Advanced directives List of other physicians Hospitalizations, surgeries, and ER visits in previous 12 months Vitals Screenings to include cognitive, depression, and falls Referrals and appointments  In addition, I have reviewed and discussed with patient certain preventive protocols, quality metrics, and best practice recommendations. A written personalized care plan for preventive services as well as general preventive health recommendations were provided to patient.     Birdie Fetty X Evrett Hakim, NP   10/01/2022

## 2022-10-02 ENCOUNTER — Non-Acute Institutional Stay (SKILLED_NURSING_FACILITY): Payer: Medicare Other | Admitting: Nurse Practitioner

## 2022-10-02 ENCOUNTER — Encounter: Payer: Self-pay | Admitting: Nurse Practitioner

## 2022-10-02 DIAGNOSIS — R350 Frequency of micturition: Secondary | ICD-10-CM

## 2022-10-02 DIAGNOSIS — R319 Hematuria, unspecified: Secondary | ICD-10-CM | POA: Diagnosis not present

## 2022-10-02 DIAGNOSIS — I10 Essential (primary) hypertension: Secondary | ICD-10-CM | POA: Diagnosis not present

## 2022-10-02 DIAGNOSIS — M159 Polyosteoarthritis, unspecified: Secondary | ICD-10-CM

## 2022-10-02 DIAGNOSIS — F0390 Unspecified dementia without behavioral disturbance: Secondary | ICD-10-CM

## 2022-10-02 DIAGNOSIS — I4892 Unspecified atrial flutter: Secondary | ICD-10-CM

## 2022-10-02 DIAGNOSIS — K5901 Slow transit constipation: Secondary | ICD-10-CM

## 2022-10-02 NOTE — Assessment & Plan Note (Signed)
Blood pressure is controlled,  takes Amlodipine, prn Clonidine

## 2022-10-02 NOTE — Assessment & Plan Note (Signed)
chronic R ankle pain, uses ankle brace, mechanical lift for transfer,  w/c to get around.

## 2022-10-02 NOTE — Assessment & Plan Note (Signed)
At her baseline,  on Mirabegron

## 2022-10-02 NOTE — Assessment & Plan Note (Signed)
takes Levothyroxine, TSH 3.19 03/27/22 

## 2022-10-02 NOTE — Assessment & Plan Note (Signed)
f/u Cardiology, off anticoagulation 2/2 falls in the past, restarted Eliquis after acute CVA 11/24/21 

## 2022-10-02 NOTE — Progress Notes (Signed)
Location:   [facility] Room Number: 64 Place of Service:  SNF (31) Provider: Arna Snipe Dezmin Kittelson NP  Jasmaine Rochel X, NP  Patient Care Team: Rechel Delosreyes X, NP as PCP - General (Internal Medicine) [name], MD as PCP - Cardiology (Cardiology) Olivia Mackie, MD as Consulting Physician (Obstetrics and Gynecology) De Blanch, MD as Consulting Physician (Gynecology) Cleda Mccreedy, MD as Consulting Physician (Gynecologic Oncology)  Extended Emergency Contact Information Primary Emergency Contact: [facility] Address: [address]          [place], [place] [address] Darden Amber of [place] Home Phone: [phone] Mobile Phone: [phone] Relation: Daughter  Code Status: DNR Goals of care: Advanced Directive information    10/02/2022    4:37 PM  Advanced Directives  Does Patient Have a Medical Advance Directive? Yes  Type of Estate agent of [place];Living will  Does patient want to make changes to medical advance directive? No - Patient declined  Copy of Healthcare Power of Attorney in Chart? Yes - validated most recent copy scanned in chart (See row information)     Chief Complaint  Patient presents with   Acute Visit    rectal bleed    HPI:  Pt is a 89 y.o. female seen today for an acute visit for 10/01/22 reported bleeding in stool after having BM, also noted blood when wiping resideent, blood noted in commode and on BM and in peri area. No apparent hemorrhoids or vaginal bleed noted upon my examination, negative FOBT bedside test, no noted pain in anal rectal, perineal, or lower abd. She is afebrile.   11/24/21 acute CVA left PCA embolic strokes, no apparent residual symptoms, on Eliquis f/u Neurology             CKD, Bun/creat 21/1.17 08/31/22             Hypothyroidism, takes Levothyroxine, TSH 3.19 03/27/22             Hyponatremia, Na 136 08/31/22             Syncope: Orthostasis ?, vasovagal episode 08/31/22,  no  further recurrence              A flutter, f/u Cardiology, off anticoagulation 2/2 falls in the past, restarted Eliquis after acute CVA 11/24/21             Hx of ovarian CA s/p TAH/BSO 03/2011, in remission.             Senile dementia,  SNF [facility] for supportive care, 01/29/22 MMSE 23/30             Constipation takes Colace. MiraLax qod             HTN, takes Amlodipine, prn Clonidine             OP, takes Ca, Vit D             Insomnia, off Ambien, off Melatonin.              Hyperlipidemia, on Atorvastatin, LDL 127 11/25/21             OA, chronic R ankle pain, uses ankle brace, mechanical lift for transfer,  w/c to get around.              Urinary frequency, on Mirabegron  Past Medical History:  Diagnosis Date   Anemia associated with acute blood loss 04/03/2011   Arthritis    knees    Ascites  Blood transfusion    hx of 2011    Complication of anesthesia    Sodium drops per pt    Cystitis    DIARRHEA, ANTIBIOTIC ASSOCIATED 05/31/2009   Qualifier: Diagnosis of  By: Daiva Eves MD, Remi Haggard     GERD (gastroesophageal reflux disease)    H/O hiatal hernia    Hyperlipidemia    Hypertension    Hyponatremia    Neutropenia with fever (HCC) 05/18/2011   OSTEOMYELITIS, CHRONIC, LOWER LEG 04/23/2009   Qualifier: Diagnosis of  By: Ninetta Lights MD, Linus Mako    OSTEOPOROSIS 04/23/2009   Qualifier: Diagnosis of  By: Ninetta Lights MD, Jeffrey     Ovarian cancer (HCC) 01/25/2011   Pneumonia    hx of    Postmenopausal atrophic vaginitis    PROSTHETIC JOINT COMPLICATION 05/28/2009   Qualifier: Diagnosis of  By: Daiva Eves MD, Cornelius     Renal disorder    Decreased kidney function   Staph infection 2010   after knee replacement   Urinary frequency    Vulvitis    Past Surgical History:  Procedure Laterality Date   ABDOMINAL HYSTERECTOMY  04/01/2011   Procedure: HYSTERECTOMY ABDOMINAL;  Surgeon: Jeannette Corpus, MD;  Location: [facility];  Service: Gynecology;  Laterality: N/A;    APPENDECTOMY     JOINT REPLACEMENT     R knee in 2008, 5 operations on L knee   LAPAROTOMY  04/01/2011   Procedure: EXPLORATORY LAPAROTOMY;  Surgeon: Jeannette Corpus, MD;  Location: [facility];  Service: Gynecology;  Laterality: N/A;   OTHER SURGICAL HISTORY     hx of C section 1966   SALPINGOOPHORECTOMY  04/01/2011   Procedure: SALPINGO OOPHERECTOMY;  Surgeon: Jeannette Corpus, MD;  Location: [facility];  Service: Gynecology;  Laterality: Bilateral;   TONSILLECTOMY      Allergies  Allergen Reactions   Morphine And Codeine Anaphylaxis and Other (See Comments)    Given after knee replacement and code blue occurred   Morphine Sulfate Anaphylaxis   Penicillins Hives, Itching and Other (See Comments)    Syncope, also   Nsaids Other (See Comments)    Because of an abnormal kidney test result   Penicillin V Hives   Shellfish Allergy Nausea And Vomiting and Other (See Comments)    Pt has shellfish allergy only.  Has had IV contrast x 2 and did fine.   Shellfish-Derived Products Nausea And Vomiting    Allergies as of 10/02/2022       Reactions   Morphine And Codeine Anaphylaxis, Other (See Comments)   Given after knee replacement and code blue occurred   Morphine Sulfate Anaphylaxis   Penicillins Hives, Itching, Other (See Comments)   Syncope, also   Nsaids Other (See Comments)   Because of an abnormal kidney test result   Penicillin V Hives   Shellfish Allergy Nausea And Vomiting, Other (See Comments)   Pt has shellfish allergy only.  Has had IV contrast x 2 and did fine.   Shellfish-derived Products Nausea And Vomiting        Medication List        Accurate as of October 02, 2022 11:59 PM. If you have any questions, ask your nurse or doctor.          acetaminophen 325 MG tablet Commonly known as: TYLENOL Take 650 mg by mouth every 4 (four) hours as needed for moderate pain or fever.   amLODipine 5 MG tablet Commonly known as: NORVASC  Take 5 mg by mouth  daily.   apixaban 5 MG Tabs tablet Commonly known as: ELIQUIS Take 1 tablet (5 mg total) by mouth 2 (two) times daily.   atorvastatin 40 MG tablet Commonly known as: LIPITOR Take 1 tablet (40 mg total) by mouth daily.   B-COMPLEX/FOLIC ACID/VITAMIN C PO Take 1 tablet by mouth daily with breakfast.   bimatoprost 0.03 % ophthalmic solution Commonly known as: LUMIGAN Place 1 drop into both eyes at bedtime.   cloNIDine 0.1 MG tablet Commonly known as: CATAPRES Take 0.1 mg by mouth daily as needed (hypertension).   docusate sodium 100 MG capsule Commonly known as: COLACE Take 100 mg by mouth daily.   levothyroxine 25 MCG tablet Commonly known as: SYNTHROID Take 25 mcg by mouth daily before breakfast.   Myrbetriq 25 MG Tb24 tablet Generic drug: mirabegron ER Take 25 mg by mouth daily.   nystatin powder Commonly known as: MYCOSTATIN/NYSTOP Apply 1 application  topically 2 (two) times daily as needed (reason not listed).   pantoprazole 40 MG tablet Commonly known as: PROTONIX Take 40 mg by mouth daily.   polyethylene glycol 17 g packet Commonly known as: MIRALAX / GLYCOLAX Take 17 g by mouth daily.   polyvinyl alcohol 1.4 % ophthalmic solution Commonly known as: LIQUIFILM TEARS Place 2 drops into both eyes in the morning, at noon, in the evening, and at bedtime.   sodium chloride 1 g tablet Take 1 g by mouth 2 (two) times daily.   Vitamin D3 50 MCG (2000 UT) Tabs Take 2,000 Units by mouth in the morning.        Review of Systems  Constitutional:  Negative for appetite change and fever.  HENT:  Positive for hearing loss. Negative for congestion and trouble swallowing.   Eyes:  Negative for visual disturbance.  Respiratory:  Negative for shortness of breath.   Cardiovascular:  Negative for leg swelling.  Gastrointestinal:  Positive for anal bleeding and blood in stool. Negative for abdominal pain, constipation, diarrhea, nausea, rectal pain and vomiting.   Genitourinary:  Positive for frequency. Negative for difficulty urinating, dysuria, flank pain and urgency.  Musculoskeletal:  Positive for arthralgias, back pain and gait problem.       R ankle pain is chronic  Skin:        ecchymoses  Neurological:  Negative for speech difficulty, weakness and light-headedness.       Memory deficit  Psychiatric/Behavioral:  Positive for confusion. Negative for sleep disturbance. The patient is not nervous/anxious.     Immunization History  Administered Date(s) Administered   Influenza Split 11/13/2012, 12/13/2012, 11/30/2013, 10/09/2014, 11/24/2018   Influenza, High Dose Seasonal PF 12/01/2013, 10/04/2014, 10/12/2015, 11/06/2016, 11/12/2017   Influenza,inj,Quad PF,6+ Mos 10/24/2010   Influenza-Unspecified 10/24/2010, 11/01/2019, 12/04/2020   Moderna Covid-19 Vaccine Bivalent Booster 21yrs & up 07/24/2021   Moderna SARS-COV2 Booster Vaccination 07/18/2020   Moderna Sars-Covid-2 Vaccination 02/14/2019, 03/14/2019, 12/26/2019   PFIZER(Purple Top)SARS-COV-2 Vaccination 10/31/2020   PPD Test 11/27/2021   Pfizer Covid-19 Vaccine Bivalent Booster 39yrs & up 10/31/2020   Pneumococcal Conjugate-13 09/28/2013   Pneumococcal Polysaccharide-23 03/05/2012   Td 10/16/2010   Td (Adult) 08/27/2000   Tdap 08/27/2000, 10/16/2010   Zoster, Live 01/11/2013, 03/31/2020   Pertinent  Health Maintenance Due  Topic Date Due   INFLUENZA VACCINE  09/11/2022   DEXA SCAN  Completed      02/12/2022    4:30 PM 03/25/2022   11:07 AM 04/03/2022    3:37 PM 04/18/2022  9:19 AM 07/04/2022   10:17 AM  Fall Risk  Falls in the past year? 0 0 0 0 1  Was there an injury with Fall? 0 0 0 0 0  Fall Risk Category Calculator 0 0 0 0 2  Fall Risk Category (Retired) Low      (RETIRED) Patient Fall Risk Level High fall risk      Patient at Risk for Falls Due to History of fall(s);Impaired balance/gait;Impaired mobility;Mental status change History of fall(s);Impaired  balance/gait;Impaired mobility;Mental status change History of fall(s);Impaired balance/gait;Impaired mobility;Mental status change History of fall(s);Impaired balance/gait;Impaired mobility;Mental status change History of fall(s);Impaired balance/gait;Impaired mobility;Mental status change  Fall risk Follow up Falls evaluation completed Falls evaluation completed Falls evaluation completed Falls evaluation completed Falls evaluation completed   Functional Status Survey:    Vitals:   10/02/22 1627  BP: 132/80  Pulse: 94  Resp: 18  Temp: 98.3 F (36.8 C)  SpO2: 97%  Weight: 185 lb 9.6 oz (84.2 kg)  Height: 5\' 3"  (1.6 m)   Body mass index is 32.88 kg/m. Physical Exam Vitals and nursing note reviewed.  Constitutional:      Appearance: Normal appearance.  HENT:     Head: Normocephalic and atraumatic.     Mouth/Throat:     Mouth: Mucous membranes are moist.  Eyes:     Extraocular Movements: Extraocular movements intact.     Conjunctiva/sclera: Conjunctivae normal.     Pupils: Pupils are equal, round, and reactive to light.  Cardiovascular:     Rate and Rhythm: Normal rate. Rhythm irregular.     Heart sounds: No murmur heard. Pulmonary:     Effort: Pulmonary effort is normal.     Breath sounds: No rales.  Abdominal:     General: Bowel sounds are normal.     Palpations: Abdomen is soft.     Tenderness: There is no abdominal tenderness. There is no right CVA tenderness, left CVA tenderness, guarding or rebound.     Hernia: A hernia is present.     Comments: Incision hernia  Genitourinary:    Vagina: No vaginal discharge.     Rectum: Normal. Guaiac result negative.  Musculoskeletal:        General: Tenderness present.     Cervical back: Normal range of motion and neck supple.     Right lower leg: No edema.     Left lower leg: No edema.     Comments: Right ankle brace, chronic pain. New right leg pain, not able to specify the location, stopped using walker to ambulate, no  redness or deformity noted.   Skin:    General: Skin is warm and dry.     Findings: Bruising present.     Comments: Ecchymoses: left forearm, right upper arm, right side back, abd, no pain on examination  Neurological:     General: No focal deficit present.     Mental Status: She is alert. Mental status is at baseline.     Motor: No weakness.     Gait: Gait abnormal.     Comments: Oriented to person, time  Psychiatric:        Mood and Affect: Mood normal.        Thought Content: Thought content normal.     Comments: Confused her locations.      Labs reviewed: Recent Labs    11/26/21 0420 01/22/22 0925 04/08/22 0755 08/31/22 1134  NA 135 138 139 136  138  K 3.7 4.5 4.3 3.9  4.3  CL 104 104 104 104  102  CO2 23 25 24* 24  GLUCOSE 109* 125*  --  108*  105*  BUN 29* 21 24* 21  26*  CREATININE 1.23* 1.23* 1.2* 1.17*  1.30*  CALCIUM 8.7* 9.3 9.3 8.7*   Recent Labs    11/24/21 1023 01/22/22 0925 08/31/22 1134  AST 23 26 27   ALT 16 16 19   ALKPHOS 67 68 83  BILITOT 0.6 0.6 0.7  PROT 8.5* 7.1 7.2  ALBUMIN 4.2 3.5 3.4*   Recent Labs    11/26/21 0420 01/22/22 0925 04/08/22 0755 08/31/22 1134  WBC 8.9 7.8 7.3 9.0  NEUTROABS  --  6.3 3,833.00 7.0  HGB 12.7 12.6 12.4 12.7  13.6  HCT 40.1 40.6 38 40.3  40.0  MCV 84.2 84.1  --  85.7  PLT 202 191 224 227   Lab Results  Component Value Date   TSH 3.19 03/27/2022   Lab Results  Component Value Date   HGBA1C 5.7 (H) 11/24/2021   Lab Results  Component Value Date   CHOL 181 11/25/2021   HDL 37 (L) 11/25/2021   LDLCALC 127 (H) 11/25/2021   TRIG 85 11/25/2021   CHOLHDL 4.9 11/25/2021    Significant Diagnostic Results in last 30 days:  No results found.  Assessment/Plan: Hematuria ?  10/01/22 reported bleeding in stool after having BM, also noted blood when wiping residenet, blood noted in commode and on BM and in peri area. No apparent hemorrhoids noted upon my examination, negative FOBT bedside test,  no noted pain in anal rectal, perineal, or lower abd. She is afebrile.  Will obtain CBC/diff, CMP/eGFR, UA C/S to evaluate further.   Urinary frequency At her baseline, on Mirabegron  Osteoarthritis  chronic R ankle pain, uses ankle brace, mechanical lift for transfer,  w/c to get around.   Essential hypertension Blood pressure is controlled,  takes Amlodipine, prn Clonidine  Slow transit constipation Stable, last BM yesterday, digital rectal exam no hard stool, takes Colace. MiraLax qod  Senile dementia (HCC)   SNF [facility] for supportive care, 01/29/22 MMSE 23/30, limited comprehension to surroundings.   Atrial flutter (HCC)  f/u Cardiology, off anticoagulation 2/2 falls in the past, restarted Eliquis after acute CVA 11/24/21  Hypothyroidism  takes Levothyroxine, TSH 3.19 03/27/22    Family/ staff Communication: plan of care reviewed with the patient and charge nurse.   Labs/tests ordered:  bed side FOBT negative, obtain CBC/diff, CMP/eGFR, UA C/S  Time spend 35 minute.s

## 2022-10-02 NOTE — Assessment & Plan Note (Signed)
?    10/01/22 reported bleeding in stool after having BM, also noted blood when wiping residenet, blood noted in commode and on BM and in peri area. No apparent hemorrhoids noted upon my examination, negative FOBT bedside test, no noted pain in anal rectal, perineal, or lower abd. She is afebrile.  Will obtain CBC/diff, CMP/eGFR, UA C/S to evaluate further.

## 2022-10-02 NOTE — Assessment & Plan Note (Signed)
Stable, last BM yesterday, digital rectal exam no hard stool, takes Colace. MiraLax qod

## 2022-10-02 NOTE — Assessment & Plan Note (Signed)
SNF [facility] for supportive care, 01/29/22 MMSE 23/30, limited comprehension to surroundings.

## 2022-10-03 ENCOUNTER — Encounter: Payer: Self-pay | Admitting: Orthopedic Surgery

## 2022-10-03 ENCOUNTER — Non-Acute Institutional Stay (SKILLED_NURSING_FACILITY): Payer: Medicare Other | Admitting: Orthopedic Surgery

## 2022-10-03 DIAGNOSIS — F03911 Unspecified dementia, unspecified severity, with agitation: Secondary | ICD-10-CM | POA: Diagnosis not present

## 2022-10-03 DIAGNOSIS — R319 Hematuria, unspecified: Secondary | ICD-10-CM | POA: Diagnosis not present

## 2022-10-03 NOTE — Progress Notes (Unsigned)
Location:  [facility] Room Number: 43/A Place of Service:  SNF (31) Provider:  Octavia Heir, NP   Mast, Man X, NP  Patient Care Team: Mast, Man X, NP as PCP - General (Internal Medicine) [name], MD as PCP - Cardiology (Cardiology) Olivia Mackie, MD as Consulting Physician (Obstetrics and Gynecology) De Blanch, MD as Consulting Physician (Gynecology) Cleda Mccreedy, MD as Consulting Physician (Gynecologic Oncology)  Extended Emergency Contact Information Primary Emergency Contact: [facility] Address: [address]          [place], [place] [address] D[place] of [place] Home Phone: [phone] Mobile Phone: [phone] Relation: Daughter  Code Status:  DNR Goals of care: Advanced Directive information    10/02/2022    4:37 PM  Advanced Directives  Does Patient Have a Medical Advance Directive? Yes  Type of Estate agent of [place];Living will  Does patient want to make changes to medical advance directive? No - Patient declined  Copy of Healthcare Power of Attorney in Chart? Yes - validated most recent copy scanned in chart (See row information)     Chief Complaint  Patient presents with   Acute Visit    Increased agitation/ confusion    HPI:  Pt is a 89 y.o. female seen today for medical management of chronic diseases.     Past Medical History:  Diagnosis Date   Anemia associated with acute blood loss 04/03/2011   Arthritis    knees    Ascites    Blood transfusion    hx of 2011    Complication of anesthesia    Sodium drops per pt    Cystitis    DIARRHEA, ANTIBIOTIC ASSOCIATED 05/31/2009   Qualifier: Diagnosis of  By: Daiva Eves MD, Cornelius     GERD (gastroesophageal reflux disease)    H/O hiatal hernia    Hyperlipidemia    Hypertension    Hyponatremia    Neutropenia with fever (HCC) 05/18/2011   OSTEOMYELITIS, CHRONIC, LOWER LEG 04/23/2009   Qualifier: Diagnosis of  By:  Ninetta Lights MD, Linus Mako    OSTEOPOROSIS 04/23/2009   Qualifier: Diagnosis of  By: Ninetta Lights MD, Jeffrey     Ovarian cancer (HCC) 01/25/2011   Pneumonia    hx of    Postmenopausal atrophic vaginitis    PROSTHETIC JOINT COMPLICATION 05/28/2009   Qualifier: Diagnosis of  By: Daiva Eves MD, Cornelius     Renal disorder    Decreased kidney function   Staph infection 2010   after knee replacement   Urinary frequency    Vulvitis    Past Surgical History:  Procedure Laterality Date   ABDOMINAL HYSTERECTOMY  04/01/2011   Procedure: HYSTERECTOMY ABDOMINAL;  Surgeon: Jeannette Corpus, MD;  Location: [facility];  Service: Gynecology;  Laterality: N/A;   APPENDECTOMY     JOINT REPLACEMENT     R knee in 2008, 5 operations on L knee   LAPAROTOMY  04/01/2011   Procedure: EXPLORATORY LAPAROTOMY;  Surgeon: Jeannette Corpus, MD;  Location: [facility];  Service: Gynecology;  Laterality: N/A;   OTHER SURGICAL HISTORY     hx of C section 1966   SALPINGOOPHORECTOMY  04/01/2011   Procedure: SALPINGO OOPHERECTOMY;  Surgeon: Jeannette Corpus, MD;  Location: [facility];  Service: Gynecology;  Laterality: Bilateral;   TONSILLECTOMY      Allergies  Allergen Reactions   Morphine And Codeine Anaphylaxis and Other (See Comments)    Given after knee  replacement and code blue occurred   Morphine Sulfate Anaphylaxis   Penicillins Hives, Itching and Other (See Comments)    Syncope, also   Nsaids Other (See Comments)    Because of an abnormal kidney test result   Penicillin V Hives   Shellfish Allergy Nausea And Vomiting and Other (See Comments)    Pt has shellfish allergy only.  Has had IV contrast x 2 and did fine.   Shellfish-Derived Products Nausea And Vomiting    Outpatient Encounter Medications as of 10/03/2022  Medication Sig   acetaminophen (TYLENOL) 325 MG tablet Take 650 mg by mouth every 4 (four) hours as needed for moderate pain or fever.   amLODipine (NORVASC) 5 MG tablet Take 5  mg by mouth daily.   apixaban (ELIQUIS) 5 MG TABS tablet Take 1 tablet (5 mg total) by mouth 2 (two) times daily.   atorvastatin (LIPITOR) 40 MG tablet Take 1 tablet (40 mg total) by mouth daily.   B Complex-C-Folic Acid (B-COMPLEX/FOLIC ACID/VITAMIN C PO) Take 1 tablet by mouth daily with breakfast.   bimatoprost (LUMIGAN) 0.03 % ophthalmic solution Place 1 drop into both eyes at bedtime.   Cholecalciferol (VITAMIN D3) 50 MCG (2000 UT) TABS Take 2,000 Units by mouth in the morning.   cloNIDine (CATAPRES) 0.1 MG tablet Take 0.1 mg by mouth daily as needed (hypertension).   docusate sodium (COLACE) 100 MG capsule Take 100 mg by mouth daily.   levothyroxine (SYNTHROID) 25 MCG tablet Take 25 mcg by mouth daily before breakfast.   mirabegron ER (MYRBETRIQ) 25 MG TB24 tablet Take 25 mg by mouth daily.   nystatin (MYCOSTATIN/NYSTOP) powder Apply 1 application  topically 2 (two) times daily as needed (reason not listed).   pantoprazole (PROTONIX) 40 MG tablet Take 40 mg by mouth daily.   polyethylene glycol (MIRALAX / GLYCOLAX) 17 g packet Take 17 g by mouth daily.   polyvinyl alcohol (LIQUIFILM TEARS) 1.4 % ophthalmic solution Place 2 drops into both eyes in the morning, at noon, in the evening, and at bedtime.   sodium chloride 1 g tablet Take 1 g by mouth 2 (two) times daily.   No facility-administered encounter medications on file as of 10/03/2022.    Review of Systems  Immunization History  Administered Date(s) Administered   Influenza Split 11/13/2012, 12/13/2012, 11/30/2013, 10/09/2014, 11/24/2018   Influenza, High Dose Seasonal PF 12/01/2013, 10/04/2014, 10/12/2015, 11/06/2016, 11/12/2017   Influenza,inj,Quad PF,6+ Mos 10/24/2010   Influenza-Unspecified 10/24/2010, 11/01/2019, 12/04/2020   Moderna Covid-19 Vaccine Bivalent Booster 66yrs & up 07/24/2021   Moderna SARS-COV2 Booster Vaccination 07/18/2020   Moderna Sars-Covid-2 Vaccination 02/14/2019, 03/14/2019, 12/26/2019   PFIZER(Purple  Top)SARS-COV-2 Vaccination 10/31/2020   PPD Test 11/27/2021   Pfizer Covid-19 Vaccine Bivalent Booster 4yrs & up 10/31/2020   Pneumococcal Conjugate-13 09/28/2013   Pneumococcal Polysaccharide-23 03/05/2012   Td 10/16/2010   Td (Adult) 08/27/2000   Tdap 08/27/2000, 10/16/2010   Zoster, Live 01/11/2013, 03/31/2020   Pertinent  Health Maintenance Due  Topic Date Due   INFLUENZA VACCINE  09/11/2022   DEXA SCAN  Completed      02/12/2022    4:30 PM 03/25/2022   11:07 AM 04/03/2022    3:37 PM 04/18/2022    9:19 AM 07/04/2022   10:17 AM  Fall Risk  Falls in the past year? 0 0 0 0 1  Was there an injury with Fall? 0 0 0 0 0  Fall Risk Category Calculator 0 0 0 0 2  Fall Risk Category (  Retired) Low      (RETIRED) Patient Fall Risk Level High fall risk      Patient at Risk for Falls Due to History of fall(s);Impaired balance/gait;Impaired mobility;Mental status change History of fall(s);Impaired balance/gait;Impaired mobility;Mental status change History of fall(s);Impaired balance/gait;Impaired mobility;Mental status change History of fall(s);Impaired balance/gait;Impaired mobility;Mental status change History of fall(s);Impaired balance/gait;Impaired mobility;Mental status change  Fall risk Follow up Falls evaluation completed Falls evaluation completed Falls evaluation completed Falls evaluation completed Falls evaluation completed   Functional Status Survey:    Vitals:   10/03/22 1654  BP: 139/73  Pulse: 94  Resp: 14  Temp: 97.8 F (36.6 C)  SpO2: 95%  Weight: 185 lb 9.6 oz (84.2 kg)  Height: 5\' 3"  (1.6 m)   Body mass index is 32.88 kg/m. Physical Exam  Labs reviewed: Recent Labs    11/26/21 0420 01/22/22 0925 04/08/22 0755 08/31/22 1134  NA 135 138 139 136  138  K 3.7 4.5 4.3 3.9  4.3  CL 104 104 104 104  102  CO2 23 25 24* 24  GLUCOSE 109* 125*  --  108*  105*  BUN 29* 21 24* 21  26*  CREATININE 1.23* 1.23* 1.2* 1.17*  1.30*  CALCIUM 8.7* 9.3 9.3 8.7*    Recent Labs    11/24/21 1023 01/22/22 0925 08/31/22 1134  AST 23 26 27   ALT 16 16 19   ALKPHOS 67 68 83  BILITOT 0.6 0.6 0.7  PROT 8.5* 7.1 7.2  ALBUMIN 4.2 3.5 3.4*   Recent Labs    11/26/21 0420 01/22/22 0925 04/08/22 0755 08/31/22 1134  WBC 8.9 7.8 7.3 9.0  NEUTROABS  --  6.3 3,833.00 7.0  HGB 12.7 12.6 12.4 12.7  13.6  HCT 40.1 40.6 38 40.3  40.0  MCV 84.2 84.1  --  85.7  PLT 202 191 224 227   Lab Results  Component Value Date   TSH 3.19 03/27/2022   Lab Results  Component Value Date   HGBA1C 5.7 (H) 11/24/2021   Lab Results  Component Value Date   CHOL 181 11/25/2021   HDL 37 (L) 11/25/2021   LDLCALC 127 (H) 11/25/2021   TRIG 85 11/25/2021   CHOLHDL 4.9 11/25/2021    Significant Diagnostic Results in last 30 days:  No results found.  Assessment/Plan There are no diagnoses linked to this encounter.   Family/ staff Communication: ***  Labs/tests ordered:  ***

## 2022-10-06 MED ORDER — HYDROXYZINE HCL 10 MG PO TABS
10.0000 mg | ORAL_TABLET | Freq: Three times a day (TID) | ORAL | Status: AC | PRN
Start: 2022-10-03 — End: 2022-10-10

## 2022-10-07 ENCOUNTER — Non-Acute Institutional Stay (SKILLED_NURSING_FACILITY): Payer: Medicare Other | Admitting: Family Medicine

## 2022-10-07 DIAGNOSIS — F410 Panic disorder [episodic paroxysmal anxiety] without agoraphobia: Secondary | ICD-10-CM | POA: Insufficient documentation

## 2022-10-07 LAB — BASIC METABOLIC PANEL
BUN: 15 (ref 4–21)
CO2: 24 — AB (ref 13–22)
Chloride: 104 (ref 99–108)
Creatinine: 1 (ref 0.5–1.1)
Glucose: 98
Potassium: 4.1 meq/L (ref 3.5–5.1)
Sodium: 135 — AB (ref 137–147)

## 2022-10-07 LAB — CBC AND DIFFERENTIAL
HCT: 30 — AB (ref 36–46)
Hemoglobin: 9.6 — AB (ref 12.0–16.0)
Neutrophils Absolute: 5446
Platelets: 315 10*3/uL (ref 150–400)
WBC: 7.3

## 2022-10-07 LAB — HEPATIC FUNCTION PANEL
ALT: 14 U/L (ref 7–35)
AST: 17 (ref 13–35)
Alkaline Phosphatase: 80 (ref 25–125)
Bilirubin, Total: 0.7

## 2022-10-07 LAB — CBC: RBC: 3.64 — AB (ref 3.87–5.11)

## 2022-10-07 LAB — COMPREHENSIVE METABOLIC PANEL
Albumin: 2.7 — AB (ref 3.5–5.0)
Calcium: 8.1 — AB (ref 8.7–10.7)
Globulin: 2.7

## 2022-10-07 NOTE — Progress Notes (Signed)
Provider:  Jacalyn Lefevre, MD Location:      Place of Service:     PCP: Mast, Man X, NP Patient Care Team: Mast, Man X, NP as PCP - General (Internal Medicine) [name], MD as PCP - Cardiology (Cardiology) Olivia Mackie, MD as Consulting Physician (Obstetrics and Gynecology) De Blanch, MD as Consulting Physician (Gynecology) Cleda Mccreedy, MD as Consulting Physician (Gynecologic Oncology)  Extended Emergency Contact Information Primary Emergency Contact: [facility] Address: [address]          [place], [place] [address] [place] of [place] Home Phone: [phone] Mobile Phone: [phone] Relation: Daughter  Code Status:  Goals of Care: Advanced Directive information    10/02/2022    4:37 PM  Advanced Directives  Does Patient Have a Medical Advance Directive? Yes  Type of Estate agent of [place];Living will  Does patient want to make changes to medical advance directive? No - Patient declined  Copy of Healthcare Power of Attorney in Chart? Yes - validated most recent copy scanned in chart (See row information)      No chief complaint on file.   HPI: Patient is a 89 y.o. female seen today at the request of nursing staff for what they perceived to be a panic attack.  Patient's O2 sat has decreased to 83% but after the application of 2 L oxygen increased to 93%. When asked about how she feels she would only repeat the treatment to rule off, they treatment to rough.  When staff questioned it seems like she does not like to use a sit status to stand board when helping her to the bathroom.  As long as there are 2 staff folks to help her there is no problem. She denies any pain shortness of breath or palpitations.  She was treated for COVID earlier this month. Additionally she has been in bed today and has not eaten.  Past Medical History:  Diagnosis Date   Anemia associated with acute blood loss 04/03/2011    Arthritis    knees    Ascites    Blood transfusion    hx of 2011    Complication of anesthesia    Sodium drops per pt    Cystitis    DIARRHEA, ANTIBIOTIC ASSOCIATED 05/31/2009   Qualifier: Diagnosis of  By: Daiva Eves MD, Cornelius     GERD (gastroesophageal reflux disease)    H/O hiatal hernia    Hyperlipidemia    Hypertension    Hyponatremia    Neutropenia with fever (HCC) 05/18/2011   OSTEOMYELITIS, CHRONIC, LOWER LEG 04/23/2009   Qualifier: Diagnosis of  By: Ninetta Lights MD, Linus Mako    OSTEOPOROSIS 04/23/2009   Qualifier: Diagnosis of  By: Ninetta Lights MD, Jeffrey     Ovarian cancer (HCC) 01/25/2011   Pneumonia    hx of    Postmenopausal atrophic vaginitis    PROSTHETIC JOINT COMPLICATION 05/28/2009   Qualifier: Diagnosis of  By: Daiva Eves MD, Cornelius     Renal disorder    Decreased kidney function   Staph infection 2010   after knee replacement   Urinary frequency    Vulvitis    Past Surgical History:  Procedure Laterality Date   ABDOMINAL HYSTERECTOMY  04/01/2011   Procedure: HYSTERECTOMY ABDOMINAL;  Surgeon: Jeannette Corpus, MD;  Location: [facility];  Service: Gynecology;  Laterality: N/A;   APPENDECTOMY     JOINT REPLACEMENT     R knee in 2008, 5 operations on L  knee   LAPAROTOMY  04/01/2011   Procedure: EXPLORATORY LAPAROTOMY;  Surgeon: Jeannette Corpus, MD;  Location: [facility];  Service: Gynecology;  Laterality: N/A;   OTHER SURGICAL HISTORY     hx of C section 1966   SALPINGOOPHORECTOMY  04/01/2011   Procedure: SALPINGO OOPHERECTOMY;  Surgeon: Jeannette Corpus, MD;  Location: [facility];  Service: Gynecology;  Laterality: Bilateral;   TONSILLECTOMY      reports that she quit smoking about 70 years ago. Her smoking use included cigarettes. She has never used smokeless tobacco. She reports that she does not currently use alcohol. She reports that she does not use drugs. Social History   Socioeconomic History   Marital status: Widowed    Spouse  name: Not on file   Number of children: Not on file   Years of education: Not on file   Highest education level: Not on file  Occupational History   Not on file  Tobacco Use   Smoking status: Former    Current packs/day: 0.00    Types: Cigarettes    Quit date: 02/11/1952    Years since quitting: 70.7   Smokeless tobacco: Never  Vaping Use   Vaping status: Never Used  Substance and Sexual Activity   Alcohol use: Not Currently   Drug use: Never   Sexual activity: Never  Other Topics Concern   Not on file  Social History Narrative   Not on file   Social Determinants of Health   Financial Resource Strain: Low Risk  (08/23/2021)   Overall Financial Resource Strain (CARDIA)    Difficulty of Paying Living Expenses: Not hard at all  Food Insecurity: No Food Insecurity (11/26/2021)   Hunger Vital Sign    Worried About Running Out of Food in the Last Year: Never true    Ran Out of Food in the Last Year: Never true  Transportation Needs: No Transportation Needs (11/26/2021)   PRAPARE - Administrator, Civil Service (Medical): No    Lack of Transportation (Non-Medical): No  Physical Activity: Insufficiently Active (08/23/2021)   Exercise Vital Sign    Days of Exercise per Week: 7 days    Minutes of Exercise per Session: 10 min  Stress: No Stress Concern Present (08/16/2020)   [employer] of Occupational Health - Occupational Stress Questionnaire    Feeling of Stress : Only a little  Social Connections: Socially Isolated (08/23/2021)   Social Connection and Isolation Panel [NHANES]    Frequency of Communication with Friends and Family: More than three times a week    Frequency of Social Gatherings with Friends and Family: Three times a week    Attends Religious Services: Never    Active Member of Clubs or Organizations: No    Attends Banker Meetings: Never    Marital Status: Widowed  Intimate Partner Violence: Not At Risk (11/26/2021)   Humiliation,  Afraid, Rape, and Kick questionnaire    Fear of Current or Ex-Partner: No    Emotionally Abused: No    Physically Abused: No    Sexually Abused: No    Functional Status Survey:    Family History  Problem Relation Age of Onset   Cancer Other        Bladder cancer   Heart attack Brother    Diabetes Brother     Health Maintenance  Topic Date Due   DTaP/Tdap/Td (5 - Td or Tdap) 10/15/2020   COVID-19 Vaccine (6 - 2023-24 season) 10/11/2021  INFLUENZA VACCINE  09/11/2022   Medicare Annual Wellness (AWV)  10/01/2023   Pneumonia Vaccine 85+ Years old  Completed   DEXA SCAN  Completed   HPV VACCINES  Aged Out   Zoster Vaccines- Shingrix  Discontinued    Allergies  Allergen Reactions   Morphine And Codeine Anaphylaxis and Other (See Comments)    Given after knee replacement and code blue occurred   Morphine Sulfate Anaphylaxis   Penicillins Hives, Itching and Other (See Comments)    Syncope, also   Nsaids Other (See Comments)    Because of an abnormal kidney test result   Penicillin V Hives   Shellfish Allergy Nausea And Vomiting and Other (See Comments)    Pt has shellfish allergy only.  Has had IV contrast x 2 and did fine.   Shellfish-Derived Products Nausea And Vomiting    Outpatient Encounter Medications as of 10/07/2022  Medication Sig   acetaminophen (TYLENOL) 325 MG tablet Take 650 mg by mouth every 4 (four) hours as needed for moderate pain or fever.   amLODipine (NORVASC) 5 MG tablet Take 5 mg by mouth daily.   apixaban (ELIQUIS) 5 MG TABS tablet Take 1 tablet (5 mg total) by mouth 2 (two) times daily.   atorvastatin (LIPITOR) 40 MG tablet Take 1 tablet (40 mg total) by mouth daily.   B Complex-C-Folic Acid (B-COMPLEX/FOLIC ACID/VITAMIN C PO) Take 1 tablet by mouth daily with breakfast.   bimatoprost (LUMIGAN) 0.03 % ophthalmic solution Place 1 drop into both eyes at bedtime.   Cholecalciferol (VITAMIN D3) 50 MCG (2000 UT) TABS Take 2,000 Units by mouth in the  morning.   cloNIDine (CATAPRES) 0.1 MG tablet Take 0.1 mg by mouth daily as needed (hypertension).   docusate sodium (COLACE) 100 MG capsule Take 100 mg by mouth daily.   hydrOXYzine (ATARAX) 10 MG tablet Take 1 tablet (10 mg total) by mouth 3 (three) times daily as needed for up to 7 days.   levothyroxine (SYNTHROID) 25 MCG tablet Take 25 mcg by mouth daily before breakfast.   mirabegron ER (MYRBETRIQ) 25 MG TB24 tablet Take 25 mg by mouth daily.   nystatin (MYCOSTATIN/NYSTOP) powder Apply 1 application  topically 2 (two) times daily as needed (reason not listed).   pantoprazole (PROTONIX) 40 MG tablet Take 40 mg by mouth daily.   polyethylene glycol (MIRALAX / GLYCOLAX) 17 g packet Take 17 g by mouth daily.   polyvinyl alcohol (LIQUIFILM TEARS) 1.4 % ophthalmic solution Place 2 drops into both eyes in the morning, at noon, in the evening, and at bedtime.   sodium chloride 1 g tablet Take 1 g by mouth 2 (two) times daily.   No facility-administered encounter medications on file as of 10/07/2022.    Review of Systems  Constitutional:  Positive for appetite change.  HENT: Negative.    Respiratory: Negative.    Cardiovascular: Negative.   Genitourinary: Negative.   Psychiatric/Behavioral:  Positive for agitation and decreased concentration.   All other systems reviewed and are negative.   There were no vitals filed for this visit. There is no height or weight on file to calculate BMI. Physical Exam Vitals and nursing note reviewed.  Constitutional:      Appearance: Normal appearance.  Cardiovascular:     Rate and Rhythm: Normal rate and regular rhythm.  Pulmonary:     Effort: Pulmonary effort is normal.     Breath sounds: Normal breath sounds.  Abdominal:     General: Bowel sounds are  normal.     Palpations: Abdomen is soft.  Neurological:     General: No focal deficit present.     Mental Status: She is alert and oriented to person, place, and time.     Comments: It should be  noted that when I walked into her room she addressed me by my name  Psychiatric:        Mood and Affect: Mood normal.     Comments: Patient does seem a bit agitated today     Labs reviewed: Basic Metabolic Panel: Recent Labs    11/26/21 0420 01/22/22 0925 04/08/22 0755 08/31/22 1134  NA 135 138 139 136  138  K 3.7 4.5 4.3 3.9  4.3  CL 104 104 104 104  102  CO2 23 25 24* 24  GLUCOSE 109* 125*  --  108*  105*  BUN 29* 21 24* 21  26*  CREATININE 1.23* 1.23* 1.2* 1.17*  1.30*  CALCIUM 8.7* 9.3 9.3 8.7*   Liver Function Tests: Recent Labs    11/24/21 1023 01/22/22 0925 08/31/22 1134  AST 23 26 27   ALT 16 16 19   ALKPHOS 67 68 83  BILITOT 0.6 0.6 0.7  PROT 8.5* 7.1 7.2  ALBUMIN 4.2 3.5 3.4*   No results for input(s): "LIPASE", "AMYLASE" in the last 8760 hours. No results for input(s): "AMMONIA" in the last 8760 hours. CBC: Recent Labs    11/26/21 0420 01/22/22 0925 04/08/22 0755 08/31/22 1134  WBC 8.9 7.8 7.3 9.0  NEUTROABS  --  6.3 3,833.00 7.0  HGB 12.7 12.6 12.4 12.7  13.6  HCT 40.1 40.6 38 40.3  40.0  MCV 84.2 84.1  --  85.7  PLT 202 191 224 227   Cardiac Enzymes: No results for input(s): "CKTOTAL", "CKMB", "CKMBINDEX", "TROPONINI" in the last 8760 hours. BNP: Invalid input(s): "POCBNP" Lab Results  Component Value Date   HGBA1C 5.7 (H) 11/24/2021   Lab Results  Component Value Date   TSH 3.19 03/27/2022   No results found for: "VITAMINB12" No results found for: "FOLATE" Lab Results  Component Value Date   IRON 74 05/27/2011   TIBC 299 05/27/2011   FERRITIN 498 (H) 05/27/2011    Imaging and Procedures obtained prior to SNF admission: CT HEAD WO CONTRAST  Result Date: 08/31/2022 CLINICAL DATA:  Syncope/presyncope with cerebrovascular cause suspected. EXAM: CT HEAD WITHOUT CONTRAST TECHNIQUE: Contiguous axial images were obtained from the base of the skull through the vertex without intravenous contrast. RADIATION DOSE REDUCTION: This  exam was performed according to the departmental dose-optimization program which includes automated exposure control, adjustment of the mA and/or kV according to patient size and/or use of iterative reconstruction technique. COMPARISON:  Head CT 01/22/2022 and brain MRI from same date FINDINGS: Brain: No evidence of acute infarction, hemorrhage, hydrocephalus, extra-axial collection or mass lesion/mass effect. Extensive chronic small vessel ischemia in the cerebral white matter. Small chronic left parietal cortex infarct confirmed on brain MRI. Generalized brain atrophy. Vascular: No hyperdense vessel or unexpected calcification. Skull: Normal. Negative for fracture or focal lesion. Sinuses/Orbits: Chronic, partial left sphenoid sinus opacification. Negative orbits. IMPRESSION: 1. No acute or interval finding. 2. Atrophy and extensive chronic small vessel ischemia. Electronically Signed   By: Tiburcio Pea M.D.   On: 08/31/2022 12:11    Assessment/Plan 1. Panic disorder This diagnosis is a bit unusual in a 89 year old person who has not had panic disorder.  Clearly there is some change in her today, wanting to stay in bed,  not eating, repeating phrases they are treating me to rough.  Objectively she is desatting.  This responded readily to low-flow oxygen. For the agitation I am giving her a one-time dose of alprazolam 0.25 mg Will follow    Family/ staff Communication:   Labs/tests ordered:  .smmsig

## 2022-10-08 ENCOUNTER — Non-Acute Institutional Stay (SKILLED_NURSING_FACILITY): Payer: Medicare Other | Admitting: Nurse Practitioner

## 2022-10-08 ENCOUNTER — Encounter: Payer: Self-pay | Admitting: Nurse Practitioner

## 2022-10-08 DIAGNOSIS — D62 Acute posthemorrhagic anemia: Secondary | ICD-10-CM | POA: Diagnosis not present

## 2022-10-08 DIAGNOSIS — I4892 Unspecified atrial flutter: Secondary | ICD-10-CM

## 2022-10-08 DIAGNOSIS — N390 Urinary tract infection, site not specified: Secondary | ICD-10-CM

## 2022-10-08 DIAGNOSIS — E871 Hypo-osmolality and hyponatremia: Secondary | ICD-10-CM

## 2022-10-08 DIAGNOSIS — K625 Hemorrhage of anus and rectum: Secondary | ICD-10-CM | POA: Diagnosis not present

## 2022-10-08 DIAGNOSIS — K219 Gastro-esophageal reflux disease without esophagitis: Secondary | ICD-10-CM

## 2022-10-08 DIAGNOSIS — I10 Essential (primary) hypertension: Secondary | ICD-10-CM | POA: Diagnosis not present

## 2022-10-08 DIAGNOSIS — E039 Hypothyroidism, unspecified: Secondary | ICD-10-CM

## 2022-10-08 DIAGNOSIS — K5901 Slow transit constipation: Secondary | ICD-10-CM

## 2022-10-08 DIAGNOSIS — N1831 Chronic kidney disease, stage 3a: Secondary | ICD-10-CM

## 2022-10-08 DIAGNOSIS — F410 Panic disorder [episodic paroxysmal anxiety] without agoraphobia: Secondary | ICD-10-CM

## 2022-10-08 DIAGNOSIS — F0390 Unspecified dementia without behavioral disturbance: Secondary | ICD-10-CM

## 2022-10-08 DIAGNOSIS — R350 Frequency of micturition: Secondary | ICD-10-CM

## 2022-10-08 NOTE — Assessment & Plan Note (Signed)
Blood pressure is controlled, dc Amlodipine since started Metoprolol for heart rate control, continue prn Clonidine

## 2022-10-08 NOTE — Assessment & Plan Note (Signed)
anxiety, nail biting, shouting, combativeness, prn Hydroxyzine used, O2 desaturation associated with anxiety. Progression of dementia, uncontrolled HR, ? Symptomatic anemia, currently treated UTI, are contributory, will have prn Alprazolam 0.5mg  bid x 14 days.

## 2022-10-08 NOTE — Assessment & Plan Note (Signed)
taking Protonix

## 2022-10-08 NOTE — Assessment & Plan Note (Addendum)
Denied headache, dizziness, chest pain or palpitation. Noted HR 120s, asymptomatic. Start Metoprolol 25mg  bid, VS q shift.   f/u Cardiology, off anticoagulation 2/2 falls in the past, restarted Eliquis after acute CVA 11/24/21 May hold Eliquis if Hgb drops continuously

## 2022-10-08 NOTE — Assessment & Plan Note (Signed)
Na 135 10/07/22

## 2022-10-08 NOTE — Assessment & Plan Note (Signed)
SNF [facility] for supportive care, 01/29/22 MMSE 23/30 

## 2022-10-08 NOTE — Assessment & Plan Note (Signed)
Rectal bleed 10/02/22, no further occurrence, FOBT 10/02/22 negative. Denied abd pain, indigestion, N/V/D, or constipation. Already on PPI, will check FOBT x3

## 2022-10-08 NOTE — Assessment & Plan Note (Signed)
Hgb 9.6 10/07/22(12.9 07/17/22), rectal bleed 10/02/22, will obtain CBC/diff, Iron, Fe Sat, TIBC, ferritin, Vit B12, Folate, observe.

## 2022-10-08 NOTE — Assessment & Plan Note (Signed)
takes Levothyroxine, TSH 3.19 03/27/22 

## 2022-10-08 NOTE — Assessment & Plan Note (Signed)
Bun/creat 15/0.96 10/07/22

## 2022-10-08 NOTE — Assessment & Plan Note (Signed)
Continue Mirabegron.

## 2022-10-08 NOTE — Progress Notes (Unsigned)
Location:   SNF [facility] Room Number: 72 Place of Service:  SNF (31) Provider: Arna Snipe Willetta York NP  Ayde Record X, NP  Patient Care Team: Shubh Chiara X, NP as PCP - General (Internal Medicine) [name], MD as PCP - Cardiology (Cardiology) Olivia Mackie, MD as Consulting Physician (Obstetrics and Gynecology) De Blanch, MD as Consulting Physician (Gynecology) Cleda Mccreedy, MD as Consulting Physician (Gynecologic Oncology)  Extended Emergency Contact Information Primary Emergency Contact: [facility] Address: [address]          [place], [place] [address] D[place] of [place] Home Phone: [phone] Mobile Phone: [phone] Relation: Daughter  Code Status: DNR Goals of care: Advanced Directive information    10/02/2022    4:37 PM  Advanced Directives  Does Patient Have a Medical Advance Directive? Yes  Type of Estate agent of [place];Living will  Does patient want to make changes to medical advance directive? No - Patient declined  Copy of Healthcare Power of Attorney in Chart? Yes - validated most recent copy scanned in chart (See row information)     Chief Complaint  Patient presents with   Acute Visit    Anxiety, O2 desaturation with anxiety episodes.     HPI:  Pt is a 89 y.o. female seen today for an acute visit for anxiety, nail biting, shouting, combativeness, prn Hydroxyzine used, O2 desaturation associated with anxiety. Denied headache, dizziness, chest pain or palpitation.   Noted HR 120s, asymptomatic  Noted anemia, Hgb 9.6 10/07/22(12.9 07/17/22)  Rectal bleed 10/02/22, no further occurrence, FOBT 10/02/22 negative. Denied abd pain, indigestion, N/V/D, or constipation.   UTI, on Cipro since 10/06/22 11/24/21 acute CVA left PCA embolic strokes, no apparent residual symptoms, on Eliquis f/u Neurology             CKD, Bun/creat 15/0.96 10/07/22  GERD, taking Protonix             Hypothyroidism, takes  Levothyroxine, TSH 3.19 03/27/22             Hyponatremia, Na 135 10/07/22             Syncope: Orthostasis ?, vasovagal episode 08/31/22,  no further recurrence              A flutter, f/u Cardiology, off anticoagulation 2/2 falls in the past, restarted Eliquis after acute CVA 11/24/21             Hx of ovarian CA s/p TAH/BSO 03/2011, in remission.             Senile dementia,  SNF [facility] for supportive care, 01/29/22 MMSE 23/30             Constipation takes Colace. MiraLax qod             HTN, started Metoprolol, off Amlodipine, prn Clonidine             OP, takes Ca, Vit D             Insomnia, off Ambien, off Melatonin.              Hyperlipidemia, on Atorvastatin, LDL 127 11/25/21             OA, chronic R ankle pain, uses ankle brace, mechanical lift for transfer,  w/c to get around.              Urinary frequency, on Mirabegron    Past Medical History:  Diagnosis Date   Anemia associated with  acute blood loss 04/03/2011   Arthritis    knees    Ascites    Blood transfusion    hx of 2011    Complication of anesthesia    Sodium drops per pt    Cystitis    DIARRHEA, ANTIBIOTIC ASSOCIATED 05/31/2009   Qualifier: Diagnosis of  By: Daiva Eves MD, Cornelius     GERD (gastroesophageal reflux disease)    H/O hiatal hernia    Hyperlipidemia    Hypertension    Hyponatremia    Neutropenia with fever (HCC) 05/18/2011   OSTEOMYELITIS, CHRONIC, LOWER LEG 04/23/2009   Qualifier: Diagnosis of  By: Ninetta Lights MD, Linus Mako    OSTEOPOROSIS 04/23/2009   Qualifier: Diagnosis of  By: Ninetta Lights MD, Jeffrey     Ovarian cancer (HCC) 01/25/2011   Pneumonia    hx of    Postmenopausal atrophic vaginitis    PROSTHETIC JOINT COMPLICATION 05/28/2009   Qualifier: Diagnosis of  By: Daiva Eves MD, Cornelius     Renal disorder    Decreased kidney function   Staph infection 2010   after knee replacement   Urinary frequency    Vulvitis    Past Surgical History:  Procedure Laterality Date   ABDOMINAL  HYSTERECTOMY  04/01/2011   Procedure: HYSTERECTOMY ABDOMINAL;  Surgeon: Jeannette Corpus, MD;  Location: [facility];  Service: Gynecology;  Laterality: N/A;   APPENDECTOMY     JOINT REPLACEMENT     R knee in 2008, 5 operations on L knee   LAPAROTOMY  04/01/2011   Procedure: EXPLORATORY LAPAROTOMY;  Surgeon: Jeannette Corpus, MD;  Location: [facility];  Service: Gynecology;  Laterality: N/A;   OTHER SURGICAL HISTORY     hx of C section 1966   SALPINGOOPHORECTOMY  04/01/2011   Procedure: SALPINGO OOPHERECTOMY;  Surgeon: Jeannette Corpus, MD;  Location: [facility];  Service: Gynecology;  Laterality: Bilateral;   TONSILLECTOMY      Allergies  Allergen Reactions   Morphine And Codeine Anaphylaxis and Other (See Comments)    Given after knee replacement and code blue occurred   Morphine Sulfate Anaphylaxis   Penicillins Hives, Itching and Other (See Comments)    Syncope, also   Nsaids Other (See Comments)    Because of an abnormal kidney test result   Penicillin V Hives   Shellfish Allergy Nausea And Vomiting and Other (See Comments)    Pt has shellfish allergy only.  Has had IV contrast x 2 and did fine.   Shellfish-Derived Products Nausea And Vomiting    Allergies as of 10/08/2022       Reactions   Morphine And Codeine Anaphylaxis, Other (See Comments)   Given after knee replacement and code blue occurred   Morphine Sulfate Anaphylaxis   Penicillins Hives, Itching, Other (See Comments)   Syncope, also   Nsaids Other (See Comments)   Because of an abnormal kidney test result   Penicillin V Hives   Shellfish Allergy Nausea And Vomiting, Other (See Comments)   Pt has shellfish allergy only.  Has had IV contrast x 2 and did fine.   Shellfish-derived Products Nausea And Vomiting        Medication List        Accurate as of October 08, 2022 11:59 PM. If you have any questions, ask your nurse or doctor.          acetaminophen 325 MG tablet Commonly known as:  TYLENOL Take 650 mg by mouth every 4 (  four) hours as needed for moderate pain or fever.   amLODipine 5 MG tablet Commonly known as: NORVASC Take 5 mg by mouth daily.   apixaban 5 MG Tabs tablet Commonly known as: ELIQUIS Take 1 tablet (5 mg total) by mouth 2 (two) times daily.   atorvastatin 40 MG tablet Commonly known as: LIPITOR Take 1 tablet (40 mg total) by mouth daily.   B-COMPLEX/FOLIC ACID/VITAMIN C PO Take 1 tablet by mouth daily with breakfast.   bimatoprost 0.03 % ophthalmic solution Commonly known as: LUMIGAN Place 1 drop into both eyes at bedtime.   cloNIDine 0.1 MG tablet Commonly known as: CATAPRES Take 0.1 mg by mouth daily as needed (hypertension).   docusate sodium 100 MG capsule Commonly known as: COLACE Take 100 mg by mouth daily.   hydrOXYzine 10 MG tablet Commonly known as: ATARAX Take 1 tablet (10 mg total) by mouth 3 (three) times daily as needed for up to 7 days.   levothyroxine 25 MCG tablet Commonly known as: SYNTHROID Take 25 mcg by mouth daily before breakfast.   Myrbetriq 25 MG Tb24 tablet Generic drug: mirabegron ER Take 25 mg by mouth daily.   nystatin powder Commonly known as: MYCOSTATIN/NYSTOP Apply 1 application  topically 2 (two) times daily as needed (reason not listed).   pantoprazole 40 MG tablet Commonly known as: PROTONIX Take 40 mg by mouth daily.   polyethylene glycol 17 g packet Commonly known as: MIRALAX / GLYCOLAX Take 17 g by mouth daily.   polyvinyl alcohol 1.4 % ophthalmic solution Commonly known as: LIQUIFILM TEARS Place 2 drops into both eyes in the morning, at noon, in the evening, and at bedtime.   sodium chloride 1 g tablet Take 1 g by mouth 2 (two) times daily.   Vitamin D3 50 MCG (2000 UT) Tabs Take 2,000 Units by mouth in the morning.        Review of Systems  Constitutional:  Positive for fatigue. Negative for appetite change and fever.  HENT:  Positive for hearing loss. Negative for  congestion and trouble swallowing.   Eyes:  Negative for visual disturbance.  Respiratory:  Negative for chest tightness, shortness of breath and wheezing.   Cardiovascular:  Negative for chest pain, palpitations and leg swelling.  Gastrointestinal:  Positive for anal bleeding and blood in stool. Negative for abdominal pain, constipation, diarrhea, nausea, rectal pain and vomiting.  Genitourinary:  Positive for frequency. Negative for difficulty urinating, dysuria, flank pain and urgency.  Musculoskeletal:  Positive for arthralgias, back pain and gait problem.       R ankle pain is chronic  Skin:        ecchymoses  Neurological:  Negative for speech difficulty, weakness and light-headedness.       Memory deficit  Psychiatric/Behavioral:  Positive for confusion. Negative for sleep disturbance. The patient is nervous/anxious.     Immunization History  Administered Date(s) Administered   Influenza Split 11/13/2012, 12/13/2012, 11/30/2013, 10/09/2014, 11/24/2018   Influenza, High Dose Seasonal PF 12/01/2013, 10/04/2014, 10/12/2015, 11/06/2016, 11/12/2017   Influenza,inj,Quad PF,6+ Mos 10/24/2010   Influenza-Unspecified 10/24/2010, 11/01/2019, 12/04/2020   Moderna Covid-19 Vaccine Bivalent Booster 71yrs & up 07/24/2021   Moderna SARS-COV2 Booster Vaccination 07/18/2020   Moderna Sars-Covid-2 Vaccination 02/14/2019, 03/14/2019, 12/26/2019   PFIZER(Purple Top)SARS-COV-2 Vaccination 10/31/2020   PPD Test 11/27/2021   Pfizer Covid-19 Vaccine Bivalent Booster 58yrs & up 10/31/2020   Pneumococcal Conjugate-13 09/28/2013   Pneumococcal Polysaccharide-23 03/05/2012   Td 10/16/2010   Td (Adult) 08/27/2000  Tdap 08/27/2000, 10/16/2010   Zoster, Live 01/11/2013, 03/31/2020   Pertinent  Health Maintenance Due  Topic Date Due   INFLUENZA VACCINE  09/11/2022   DEXA SCAN  Completed      02/12/2022    4:30 PM 03/25/2022   11:07 AM 04/03/2022    3:37 PM 04/18/2022    9:19 AM 07/04/2022   10:17 AM   Fall Risk  Falls in the past year? 0 0 0 0 1  Was there an injury with Fall? 0 0 0 0 0  Fall Risk Category Calculator 0 0 0 0 2  Fall Risk Category (Retired) Low      (RETIRED) Patient Fall Risk Level High fall risk      Patient at Risk for Falls Due to History of fall(s);Impaired balance/gait;Impaired mobility;Mental status change History of fall(s);Impaired balance/gait;Impaired mobility;Mental status change History of fall(s);Impaired balance/gait;Impaired mobility;Mental status change History of fall(s);Impaired balance/gait;Impaired mobility;Mental status change History of fall(s);Impaired balance/gait;Impaired mobility;Mental status change  Fall risk Follow up Falls evaluation completed Falls evaluation completed Falls evaluation completed Falls evaluation completed Falls evaluation completed   Functional Status Survey:    Vitals:   10/08/22 1105 10/08/22 1418  BP: 136/88   Pulse: (!) 120 (!) 110  Resp: 18   Temp: (!) 96.7 F (35.9 C)   SpO2: 94%   Weight: 185 lb 9.6 oz (84.2 kg)    Body mass index is 32.88 kg/m. Physical Exam Vitals and nursing note reviewed.  Constitutional:      Appearance: Normal appearance.  HENT:     Head: Normocephalic and atraumatic.     Mouth/Throat:     Mouth: Mucous membranes are moist.  Eyes:     Extraocular Movements: Extraocular movements intact.     Conjunctiva/sclera: Conjunctivae normal.     Pupils: Pupils are equal, round, and reactive to light.  Cardiovascular:     Rate and Rhythm: Bradycardia present. Rhythm irregular.     Heart sounds: No murmur heard. Pulmonary:     Effort: Pulmonary effort is normal.     Breath sounds: No rales.  Abdominal:     General: Bowel sounds are normal.     Palpations: Abdomen is soft.     Tenderness: There is no abdominal tenderness. There is no right CVA tenderness, left CVA tenderness, guarding or rebound.     Hernia: A hernia is present.     Comments: Incision hernia  Genitourinary:     Vagina: No vaginal discharge.     Rectum: Normal. Guaiac result negative.  Musculoskeletal:        General: No tenderness.     Cervical back: Normal range of motion and neck supple.     Right lower leg: No edema.     Left lower leg: No edema.     Comments: Right ankle brace, chronic pain.   Skin:    General: Skin is warm and dry.     Findings: Bruising present.     Comments: Ecchymoses: left forearm, right upper arm, right side back, abd, no pain on examination-resolving  Neurological:     General: No focal deficit present.     Mental Status: She is alert. Mental status is at baseline.     Motor: No weakness.     Gait: Gait abnormal.     Comments: Oriented to person and her room on unit.   Psychiatric:        Mood and Affect: Mood normal.        Thought  Content: Thought content normal.     Comments: Confused her locations.      Labs reviewed: Recent Labs    11/26/21 0420 01/22/22 0925 04/08/22 0755 08/31/22 1134  NA 135 138 139 136  138  K 3.7 4.5 4.3 3.9  4.3  CL 104 104 104 104  102  CO2 23 25 24* 24  GLUCOSE 109* 125*  --  108*  105*  BUN 29* 21 24* 21  26*  CREATININE 1.23* 1.23* 1.2* 1.17*  1.30*  CALCIUM 8.7* 9.3 9.3 8.7*   Recent Labs    11/24/21 1023 01/22/22 0925 08/31/22 1134  AST 23 26 27   ALT 16 16 19   ALKPHOS 67 68 83  BILITOT 0.6 0.6 0.7  PROT 8.5* 7.1 7.2  ALBUMIN 4.2 3.5 3.4*   Recent Labs    11/26/21 0420 01/22/22 0925 04/08/22 0755 08/31/22 1134  WBC 8.9 7.8 7.3 9.0  NEUTROABS  --  6.3 3,833.00 7.0  HGB 12.7 12.6 12.4 12.7  13.6  HCT 40.1 40.6 38 40.3  40.0  MCV 84.2 84.1  --  85.7  PLT 202 191 224 227   Lab Results  Component Value Date   TSH 3.19 03/27/2022   Lab Results  Component Value Date   HGBA1C 5.7 (H) 11/24/2021   Lab Results  Component Value Date   CHOL 181 11/25/2021   HDL 37 (L) 11/25/2021   LDLCALC 127 (H) 11/25/2021   TRIG 85 11/25/2021   CHOLHDL 4.9 11/25/2021    Significant Diagnostic Results  in last 30 days:  No results found.  Assessment/Plan: Atrial flutter (HCC) Denied headache, dizziness, chest pain or palpitation. Noted HR 120s, asymptomatic. Start Metoprolol 25mg  bid, VS q shift.   f/u Cardiology, off anticoagulation 2/2 falls in the past, restarted Eliquis after acute CVA 11/24/21 May hold Eliquis if Hgb drops continuously  Essential hypertension Blood pressure is controlled, dc Amlodipine since started Metoprolol for heart rate control, continue prn Clonidine  Anemia associated with acute blood loss Hgb 9.6 10/07/22(12.9 07/17/22), rectal bleed 10/02/22, will obtain CBC/diff, Iron, Fe Sat, TIBC, ferritin, Vit B12, Folate, observe.   Rectal bleed Rectal bleed 10/02/22, no further occurrence, FOBT 10/02/22 negative. Denied abd pain, indigestion, N/V/D, or constipation. Already on PPI, will check FOBT x3  Panic disorder anxiety, nail biting, shouting, combativeness, prn Hydroxyzine used, O2 desaturation associated with anxiety. Progression of dementia, uncontrolled HR, ? Symptomatic anemia, currently treated UTI, are contributory, will have prn Alprazolam 0.5mg  bid x 14 days.   Acute lower UTI on Cipro since 10/06/22  CKD (chronic kidney disease) stage 3, GFR 30-59 ml/min (HCC) Bun/creat 15/0.96 10/07/22  GERD taking Protonix  Hypothyroidism  takes Levothyroxine, TSH 3.19 03/27/22  Hyponatremia  Na 135 10/07/22  Senile dementia (HCC) SNF [facility] for supportive care, 01/29/22 MMSE 23/30  Slow transit constipation Stable,  takes Colace. MiraLax qod  Urinary frequency Continue Mirabegron.     Family/ staff Communication: plan of care reviewed with the patient and charge nurse.   Labs/tests ordered:  CBC/diff, TIBC, ferritin, Fe sat, Iron, Vit B12, Folate, FOBT x3  Time spend 35 minutes.

## 2022-10-08 NOTE — Assessment & Plan Note (Signed)
on Cipro since 10/06/22

## 2022-10-08 NOTE — Assessment & Plan Note (Signed)
 Stable, takes Colace. MiraLax qod

## 2022-10-09 LAB — IRON,TIBC AND FERRITIN PANEL
%SAT: 14
Ferritin: 239
Iron: 27
TIBC: 189

## 2022-10-09 LAB — CBC: RBC: 4.15 (ref 3.87–5.11)

## 2022-10-09 LAB — CBC AND DIFFERENTIAL
HCT: 34 — AB (ref 36–46)
Hemoglobin: 11 — AB (ref 12.0–16.0)
Neutrophils Absolute: 6656
Platelets: 324 10*3/uL (ref 150–400)
WBC: 8.5

## 2022-10-09 LAB — VITAMIN B12: Vitamin B-12: 1179

## 2022-10-10 ENCOUNTER — Non-Acute Institutional Stay: Payer: Self-pay | Admitting: Nurse Practitioner

## 2022-10-10 ENCOUNTER — Encounter: Payer: Self-pay | Admitting: Nurse Practitioner

## 2022-10-10 DIAGNOSIS — K219 Gastro-esophageal reflux disease without esophagitis: Secondary | ICD-10-CM

## 2022-10-10 DIAGNOSIS — E039 Hypothyroidism, unspecified: Secondary | ICD-10-CM

## 2022-10-10 DIAGNOSIS — N1831 Chronic kidney disease, stage 3a: Secondary | ICD-10-CM

## 2022-10-10 DIAGNOSIS — I509 Heart failure, unspecified: Secondary | ICD-10-CM | POA: Diagnosis not present

## 2022-10-10 DIAGNOSIS — I10 Essential (primary) hypertension: Secondary | ICD-10-CM

## 2022-10-10 DIAGNOSIS — F410 Panic disorder [episodic paroxysmal anxiety] without agoraphobia: Secondary | ICD-10-CM

## 2022-10-10 DIAGNOSIS — F0390 Unspecified dementia without behavioral disturbance: Secondary | ICD-10-CM

## 2022-10-10 DIAGNOSIS — D62 Acute posthemorrhagic anemia: Secondary | ICD-10-CM

## 2022-10-10 DIAGNOSIS — M159 Polyosteoarthritis, unspecified: Secondary | ICD-10-CM

## 2022-10-10 NOTE — Assessment & Plan Note (Signed)
Blood pressure is controlled, on Metoprolol, off Amlodipine, prn Clonidine

## 2022-10-10 NOTE — Assessment & Plan Note (Signed)
takes Levothyroxine, TSH 3.19 03/27/22 

## 2022-10-10 NOTE — Assessment & Plan Note (Addendum)
cough, desaturation on and off, denied chest pain/palpitation, she is afebrile, 10/09/22 CXR CHF mild left effusion. Posterior left lower lung rales.  Furosemide 10mg , Kcl po every day, update BMP, BNP

## 2022-10-10 NOTE — Progress Notes (Signed)
Location:   SNF [facility] Room Number: 51 Place of Service:  SNF (31) Provider: Arna Snipe Asako Saliba NP  Rustin Erhart X, NP  Patient Care Team: Hunner Garcon X, NP as PCP - General (Internal Medicine) [name], MD as PCP - Cardiology (Cardiology) Olivia Mackie, MD as Consulting Physician (Obstetrics and Gynecology) De Blanch, MD as Consulting Physician (Gynecology) Cleda Mccreedy, MD as Consulting Physician (Gynecologic Oncology)  Extended Emergency Contact Information Primary Emergency Contact: [facility] Address: [address]          [place], [place] [address] D[place] of [place] Home Phone: [phone] Mobile Phone: [phone] Relation: Daughter  Code Status: DNR Goals of care: Advanced Directive information    10/02/2022    4:37 PM  Advanced Directives  Does Patient Have a Medical Advance Directive? Yes  Type of Estate agent of [place];Living will  Does patient want to make changes to medical advance directive? No - Patient declined  Copy of Healthcare Power of Attorney in Chart? Yes - validated most recent copy scanned in chart (See row information)     Chief Complaint  Patient presents with  . Acute Visit    Cough, desaturation on and off    HPI:  Pt is a 89 y.o. female seen today for an acute visit for cough, desaturation on and off, denied chest pain/palpitation, she is afebrile, currently treating UTI with Cipro-no s/s of UTI. Wbc 8.5, Hgb 11.0, plt 324, neutrophils 78.3, Iron 27, Vit B12 1179, Folate 6.6, CXR CHF mild left effusion.                IDA, Hgb 9.6 10/07/22(12.9 07/17/22)<< Hgb 11.0 Iron 27, Vit B12 1179, Folate 6.6 10/09/22, start Fe 325mg  2x/wk             Rectal bleed 10/02/22, no further occurrence, FOBT 10/02/22 negative. Denied abd pain, indigestion, N/V/D, or constipation.              UTI, on Cipro since 10/06/22 11/24/21 acute CVA left PCA embolic strokes, no apparent residual symptoms, on  Eliquis f/u Neurology             CKD, Bun/creat 15/0.96 10/07/22             GERD, taking Protonix             Hypothyroidism, takes Levothyroxine, TSH 3.19 03/27/22             Hyponatremia, Na 135 10/07/22             Syncope: Orthostasis ?, vasovagal episode 08/31/22,  no further recurrence              A flutter, f/u Cardiology, off anticoagulation 2/2 falls in the past, restarted Eliquis after acute CVA 11/24/21, heart rate is controlled on Metoprolol.              Hx of ovarian CA s/p TAH/BSO 03/2011, in remission.             Senile dementia,  SNF [facility] for supportive care, 01/29/22 MMSE 23/30             Constipation takes Colace. MiraLax qod             HTN, on Metoprolol, off Amlodipine, prn Clonidine             OP, takes Ca, Vit D             Insomnia, off Ambien, off Melatonin. Prn  Alprazolam.              Hyperlipidemia, on Atorvastatin, LDL 127 11/25/21             OA, chronic R ankle pain, uses ankle brace, mechanical lift for transfer,  w/c to get around.              Urinary frequency, on Mirabegron     Past Medical History:  Diagnosis Date  . Anemia associated with acute blood loss 04/03/2011  . Arthritis    knees   . Ascites   . Blood transfusion    hx of 2011   . Complication of anesthesia    Sodium drops per pt   . Cystitis   . DIARRHEA, ANTIBIOTIC ASSOCIATED 05/31/2009   Qualifier: Diagnosis of  By: Daiva Eves MD, Remi Haggard    . GERD (gastroesophageal reflux disease)   . H/O hiatal hernia   . Hyperlipidemia   . Hypertension   . Hyponatremia   . Neutropenia with fever (HCC) 05/18/2011  . OSTEOMYELITIS, CHRONIC, LOWER LEG 04/23/2009   Qualifier: Diagnosis of  By: Ninetta Lights MD, Tinnie Gens    . Osteopenia   . OSTEOPOROSIS 04/23/2009   Qualifier: Diagnosis of  By: Ninetta Lights MD, Tinnie Gens    . Ovarian cancer (HCC) 01/25/2011  . Pneumonia    hx of   . Postmenopausal atrophic vaginitis   . PROSTHETIC JOINT COMPLICATION 05/28/2009   Qualifier: Diagnosis of  By: Daiva Eves MD,  Remi Haggard    . Renal disorder    Decreased kidney function  . Staph infection 2010   after knee replacement  . Urinary frequency   . Vulvitis    Past Surgical History:  Procedure Laterality Date  . ABDOMINAL HYSTERECTOMY  04/01/2011   Procedure: HYSTERECTOMY ABDOMINAL;  Surgeon: Jeannette Corpus, MD;  Location: [facility];  Service: Gynecology;  Laterality: N/A;  . APPENDECTOMY    . JOINT REPLACEMENT     R knee in 2008, 5 operations on L knee  . LAPAROTOMY  04/01/2011   Procedure: EXPLORATORY LAPAROTOMY;  Surgeon: Jeannette Corpus, MD;  Location: [facility];  Service: Gynecology;  Laterality: N/A;  . OTHER SURGICAL HISTORY     hx of C section 1966  . SALPINGOOPHORECTOMY  04/01/2011   Procedure: SALPINGO OOPHERECTOMY;  Surgeon: Jeannette Corpus, MD;  Location: [facility];  Service: Gynecology;  Laterality: Bilateral;  . TONSILLECTOMY      Allergies  Allergen Reactions  . Morphine And Codeine Anaphylaxis and Other (See Comments)    Given after knee replacement and code blue occurred  . Morphine Sulfate Anaphylaxis  . Penicillins Hives, Itching and Other (See Comments)    Syncope, also  . Nsaids Other (See Comments)    Because of an abnormal kidney test result  . Penicillin V Hives  . Shellfish Allergy Nausea And Vomiting and Other (See Comments)    Pt has shellfish allergy only.  Has had IV contrast x 2 and did fine.  Marland Kitchen Shellfish-Derived Products Nausea And Vomiting    Allergies as of 10/10/2022       Reactions   Morphine And Codeine Anaphylaxis, Other (See Comments)   Given after knee replacement and code blue occurred   Morphine Sulfate Anaphylaxis   Penicillins Hives, Itching, Other (See Comments)   Syncope, also   Nsaids Other (See Comments)   Because of an abnormal kidney test result   Penicillin V Hives   Shellfish Allergy Nausea And Vomiting, Other (See Comments)  Pt has shellfish allergy only.  Has had IV contrast x 2 and did fine.   Shellfish-derived  Products Nausea And Vomiting        Medication List        Accurate as of October 10, 2022 11:59 PM. If you have any questions, ask your nurse or doctor.          acetaminophen 325 MG tablet Commonly known as: TYLENOL Take 650 mg by mouth every 4 (four) hours as needed for moderate pain or fever.   amLODipine 5 MG tablet Commonly known as: NORVASC Take 5 mg by mouth daily.   apixaban 5 MG Tabs tablet Commonly known as: ELIQUIS Take 1 tablet (5 mg total) by mouth 2 (two) times daily.   atorvastatin 40 MG tablet Commonly known as: LIPITOR Take 1 tablet (40 mg total) by mouth daily.   B-COMPLEX/FOLIC ACID/VITAMIN C PO Take 1 tablet by mouth daily with breakfast.   bimatoprost 0.03 % ophthalmic solution Commonly known as: LUMIGAN Place 1 drop into both eyes at bedtime.   cloNIDine 0.1 MG tablet Commonly known as: CATAPRES Take 0.1 mg by mouth daily as needed (hypertension).   docusate sodium 100 MG capsule Commonly known as: COLACE Take 100 mg by mouth daily.   hydrOXYzine 10 MG tablet Commonly known as: ATARAX Take 1 tablet (10 mg total) by mouth 3 (three) times daily as needed for up to 7 days.   levothyroxine 25 MCG tablet Commonly known as: SYNTHROID Take 25 mcg by mouth daily before breakfast.   Myrbetriq 25 MG Tb24 tablet Generic drug: mirabegron ER Take 25 mg by mouth daily.   nystatin powder Commonly known as: MYCOSTATIN/NYSTOP Apply 1 application  topically 2 (two) times daily as needed (reason not listed).   pantoprazole 40 MG tablet Commonly known as: PROTONIX Take 40 mg by mouth daily.   polyethylene glycol 17 g packet Commonly known as: MIRALAX / GLYCOLAX Take 17 g by mouth daily.   polyvinyl alcohol 1.4 % ophthalmic solution Commonly known as: LIQUIFILM TEARS Place 2 drops into both eyes in the morning, at noon, in the evening, and at bedtime.   sodium chloride 1 g tablet Take 1 g by mouth 2 (two) times daily.   Vitamin D3 50 MCG  (2000 UT) Tabs Take 2,000 Units by mouth in the morning.        Review of Systems  Constitutional:  Positive for fatigue. Negative for appetite change and fever.  HENT:  Positive for hearing loss. Negative for congestion and trouble swallowing.   Eyes:  Negative for visual disturbance.  Respiratory:  Positive for cough and shortness of breath. Negative for chest tightness and wheezing.   Cardiovascular:  Positive for leg swelling. Negative for chest pain and palpitations.  Gastrointestinal:  Positive for anal bleeding and blood in stool. Negative for abdominal pain, constipation, diarrhea, nausea, rectal pain and vomiting.  Genitourinary:  Positive for frequency. Negative for difficulty urinating, dysuria, flank pain and urgency.  Musculoskeletal:  Positive for arthralgias, back pain and gait problem.       R ankle pain is chronic  Skin:        ecchymoses  Neurological:  Negative for speech difficulty, weakness and light-headedness.       Memory deficit  Psychiatric/Behavioral:  Positive for confusion. Negative for sleep disturbance. The patient is not nervous/anxious.     Immunization History  Administered Date(s) Administered  . Influenza Split 11/13/2012, 12/13/2012, 11/30/2013, 10/09/2014, 11/24/2018  . Influenza, High  Dose Seasonal PF 12/01/2013, 10/04/2014, 10/12/2015, 11/06/2016, 11/12/2017  . Influenza,inj,Quad PF,6+ Mos 10/24/2010  . Influenza-Unspecified 10/24/2010, 11/01/2019, 12/04/2020  . Moderna Covid-19 Vaccine Bivalent Booster 96yrs & up 07/24/2021  . Moderna SARS-COV2 Booster Vaccination 07/18/2020  . Moderna Sars-Covid-2 Vaccination 02/14/2019, 03/14/2019, 12/26/2019  . PFIZER(Purple Top)SARS-COV-2 Vaccination 10/31/2020  . PPD Test 11/27/2021  . Research officer, [employer] 106yrs & up 10/31/2020  . Pneumococcal Conjugate-13 09/28/2013  . Pneumococcal Polysaccharide-23 03/05/2012  . Td 10/16/2010  . Td (Adult) 08/27/2000  . Tdap 08/27/2000,  10/16/2010  . Zoster, Live 01/11/2013, 03/31/2020   Pertinent  Health Maintenance Due  Topic Date Due  . INFLUENZA VACCINE  09/11/2022  . DEXA SCAN  Completed      02/12/2022    4:30 PM 03/25/2022   11:07 AM 04/03/2022    3:37 PM 04/18/2022    9:19 AM 07/04/2022   10:17 AM  Fall Risk  Falls in the past year? 0 0 0 0 1  Was there an injury with Fall? 0 0 0 0 0  Fall Risk Category Calculator 0 0 0 0 2  Fall Risk Category (Retired) Low      (RETIRED) Patient Fall Risk Level High fall risk      Patient at Risk for Falls Due to History of fall(s);Impaired balance/gait;Impaired mobility;Mental status change History of fall(s);Impaired balance/gait;Impaired mobility;Mental status change History of fall(s);Impaired balance/gait;Impaired mobility;Mental status change History of fall(s);Impaired balance/gait;Impaired mobility;Mental status change History of fall(s);Impaired balance/gait;Impaired mobility;Mental status change  Fall risk Follow up Falls evaluation completed Falls evaluation completed Falls evaluation completed Falls evaluation completed Falls evaluation completed   Functional Status Survey:    Vitals:   10/10/22 1259  BP: 128/66  Pulse: 94  Resp: 16  Temp: (!) 97.2 F (36.2 C)  Weight: 185 lb 9.6 oz (84.2 kg)   Body mass index is 32.88 kg/m. Physical Exam Vitals and nursing note reviewed.  Constitutional:      Comments: fatigue  HENT:     Head: Normocephalic and atraumatic.     Mouth/Throat:     Mouth: Mucous membranes are moist.  Eyes:     Extraocular Movements: Extraocular movements intact.     Conjunctiva/sclera: Conjunctivae normal.     Pupils: Pupils are equal, round, and reactive to light.  Cardiovascular:     Rate and Rhythm: Normal rate. Rhythm irregular.     Heart sounds: No murmur heard. Pulmonary:     Effort: Pulmonary effort is normal.     Breath sounds: Rales present.     Comments: Posterior left lower lung rales.  Abdominal:     General: Bowel  sounds are normal.     Palpations: Abdomen is soft.     Tenderness: There is no abdominal tenderness.     Hernia: A hernia is present.     Comments: Incision hernia  Musculoskeletal:     Cervical back: Normal range of motion and neck supple.     Right lower leg: Edema present.     Left lower leg: Edema present.     Comments: Right ankle brace, chronic pain. Trace to 1+ edema BLE new.   Skin:    General: Skin is warm and dry.     Findings: Bruising present.     Comments: Ecchymoses: left forearm, right upper arm, right side back, abd, no pain on examination-resolving  Neurological:     General: No focal deficit present.     Mental Status: She is alert. Mental status is at  baseline.     Motor: No weakness.     Gait: Gait abnormal.     Comments: Oriented to person and her room on unit.   Psychiatric:        Mood and Affect: Mood normal.        Thought Content: Thought content normal.     Comments: Confused her locations.     Labs reviewed: Recent Labs    11/26/21 0420 01/22/22 0925 04/08/22 0755 08/31/22 1134  NA 135 138 139 136  138  K 3.7 4.5 4.3 3.9  4.3  CL 104 104 104 104  102  CO2 23 25 24* 24  GLUCOSE 109* 125*  --  108*  105*  BUN 29* 21 24* 21  26*  CREATININE 1.23* 1.23* 1.2* 1.17*  1.30*  CALCIUM 8.7* 9.3 9.3 8.7*   Recent Labs    11/24/21 1023 01/22/22 0925 08/31/22 1134  AST 23 26 27   ALT 16 16 19   ALKPHOS 67 68 83  BILITOT 0.6 0.6 0.7  PROT 8.5* 7.1 7.2  ALBUMIN 4.2 3.5 3.4*   Recent Labs    11/26/21 0420 01/22/22 0925 04/08/22 0755 08/31/22 1134  WBC 8.9 7.8 7.3 9.0  NEUTROABS  --  6.3 3,833.00 7.0  HGB 12.7 12.6 12.4 12.7  13.6  HCT 40.1 40.6 38 40.3  40.0  MCV 84.2 84.1  --  85.7  PLT 202 191 224 227   Lab Results  Component Value Date   TSH 3.19 03/27/2022   Lab Results  Component Value Date   HGBA1C 5.7 (H) 11/24/2021   Lab Results  Component Value Date   CHOL 181 11/25/2021   HDL 37 (L) 11/25/2021   LDLCALC 127  (H) 11/25/2021   TRIG 85 11/25/2021   CHOLHDL 4.9 11/25/2021    Significant Diagnostic Results in last 30 days:  No results found.  Assessment/Plan: CHF (congestive heart failure) (HCC) cough, desaturation on and off, denied chest pain/palpitation, she is afebrile, 10/09/22 CXR CHF mild left effusion. Posterior left lower lung rales.  Furosemide 10mg , Kcl po every day, update BMP, BNP   Anemia associated with acute blood loss  Hgb 9.6 10/07/22(12.9 07/17/22)<< Hgb 11.0 Iron 27, Vit B12 1179, Folate 6.6 10/09/22, start Fe 325mg  2x/wk Adding Iron 325mg  2x/wk 10/14/22 FOBT x2/3  CKD (chronic kidney disease) stage 3, GFR 30-59 ml/min (HCC) Bun/creat 15/0.96 10/07/22  GERD taking Protonix  Hypothyroidism takes Levothyroxine, TSH 3.19 03/27/22  Atrial flutter (HCC) f/u Cardiology, off anticoagulation 2/2 falls in the past, restarted Eliquis after acute CVA 11/24/21, heart rate is controlled on Metoprolol.   Senile dementia (HCC)  SNF [facility] for supportive care, 01/29/22 MMSE 23/30  Essential hypertension Blood pressure is controlled, on Metoprolol, off Amlodipine, prn Clonidine  Panic disorder And insomnia, off Ambien, off Melatonin. Prn Alprazolam.   Osteoarthritis  chronic R ankle pain, uses ankle brace, mechanical lift for transfer,  w/c to get around.     Family/ staff Communication: plan of care reviewed with the patient and charge nurse.   Labs/tests ordered:  BMP/BNP one week  Time spend 35 minutes.

## 2022-10-10 NOTE — Assessment & Plan Note (Signed)
Hgb 9.6 10/07/22(12.9 07/17/22)<< Hgb 11.0 Iron 27, Vit B12 1179, Folate 6.6 10/09/22, start Fe 325mg  2x/wk Adding Iron 325mg  2x/wk 10/14/22 FOBT x2/3

## 2022-10-10 NOTE — Assessment & Plan Note (Signed)
And insomnia, off Ambien, off Melatonin. Prn Alprazolam.

## 2022-10-10 NOTE — Assessment & Plan Note (Signed)
 Bun/creat 15/0.96 10/07/22

## 2022-10-10 NOTE — Assessment & Plan Note (Signed)
 taking Protonix

## 2022-10-10 NOTE — Assessment & Plan Note (Signed)
SNF [facility] for supportive care, 01/29/22 MMSE 23/30 

## 2022-10-10 NOTE — Assessment & Plan Note (Signed)
 chronic R ankle pain, uses ankle brace, mechanical lift for transfer,  w/c to get around.

## 2022-10-10 NOTE — Assessment & Plan Note (Signed)
f/u Cardiology, off anticoagulation 2/2 falls in the past, restarted Eliquis after acute CVA 11/24/21, heart rate is controlled on Metoprolol.

## 2022-10-14 ENCOUNTER — Non-Acute Institutional Stay (SKILLED_NURSING_FACILITY): Payer: Medicare Other | Admitting: Nurse Practitioner

## 2022-10-14 ENCOUNTER — Encounter: Payer: Self-pay | Admitting: Nurse Practitioner

## 2022-10-14 DIAGNOSIS — K5901 Slow transit constipation: Secondary | ICD-10-CM

## 2022-10-14 DIAGNOSIS — I509 Heart failure, unspecified: Secondary | ICD-10-CM

## 2022-10-14 DIAGNOSIS — R350 Frequency of micturition: Secondary | ICD-10-CM

## 2022-10-14 DIAGNOSIS — I4892 Unspecified atrial flutter: Secondary | ICD-10-CM

## 2022-10-14 DIAGNOSIS — K219 Gastro-esophageal reflux disease without esophagitis: Secondary | ICD-10-CM

## 2022-10-14 DIAGNOSIS — K625 Hemorrhage of anus and rectum: Secondary | ICD-10-CM | POA: Diagnosis not present

## 2022-10-14 DIAGNOSIS — D62 Acute posthemorrhagic anemia: Secondary | ICD-10-CM | POA: Diagnosis not present

## 2022-10-14 DIAGNOSIS — I10 Essential (primary) hypertension: Secondary | ICD-10-CM

## 2022-10-14 DIAGNOSIS — I639 Cerebral infarction, unspecified: Secondary | ICD-10-CM | POA: Diagnosis not present

## 2022-10-14 DIAGNOSIS — E039 Hypothyroidism, unspecified: Secondary | ICD-10-CM

## 2022-10-14 DIAGNOSIS — E871 Hypo-osmolality and hyponatremia: Secondary | ICD-10-CM

## 2022-10-14 DIAGNOSIS — F0390 Unspecified dementia without behavioral disturbance: Secondary | ICD-10-CM

## 2022-10-14 DIAGNOSIS — N1831 Chronic kidney disease, stage 3a: Secondary | ICD-10-CM

## 2022-10-14 DIAGNOSIS — F5101 Primary insomnia: Secondary | ICD-10-CM

## 2022-10-14 NOTE — Assessment & Plan Note (Signed)
Rectal bleed 10/02/22, no further occurrence, FOBT 10/02/22 negative, +2/3 10/13/22, Iron started 10/09/22.  Denied abd pain, indigestion, N/V/D, or constipation.

## 2022-10-14 NOTE — Assessment & Plan Note (Signed)
takes Colace. MiraLax every other day, stable.

## 2022-10-14 NOTE — Assessment & Plan Note (Signed)
 taking Protonix

## 2022-10-14 NOTE — Assessment & Plan Note (Signed)
11/24/21 acute CVA left PCA embolic strokes, no apparent residual symptoms, on Eliquis f/u Neurology

## 2022-10-14 NOTE — Assessment & Plan Note (Signed)
 Bun/creat 15/0.96 10/07/22

## 2022-10-14 NOTE — Assessment & Plan Note (Signed)
on Mirabegron, at her baseline.

## 2022-10-14 NOTE — Progress Notes (Unsigned)
Location:   SNF [facility] Room Number: N043-A Place of Service:  SNF (31) Provider: Arna Snipe Hayly Litsey NP  Izek Corvino X, NP  Patient Care Team: Teshawn Moan X, NP as PCP - General (Internal Medicine) [name], MD as PCP - Cardiology (Cardiology) Olivia Mackie, MD as Consulting Physician (Obstetrics and Gynecology) De Blanch, MD as Consulting Physician (Gynecology) Cleda Mccreedy, MD as Consulting Physician (Gynecologic Oncology)  Extended Emergency Contact Information Primary Emergency Contact: [facility] Address: [address]          [place], [place] [address] Darden [place] of [place] Home Phone: [phone] Mobile Phone: [phone] Relation: Daughter  Code Status: DNR Goals of care: Advanced Directive information    10/14/2022    4:04 PM  Advanced Directives  Does Patient Have a Medical Advance Directive? Yes  Type of Advance Directive Living will;Healthcare Power of Attorney  Does patient want to make changes to medical advance directive? No - Patient declined  Copy of Healthcare Power of Attorney in Chart? Yes - validated most recent copy scanned in chart (See row information)     Chief Complaint  Patient presents with   Acute Visit    Poor appetite    HPI:  Pt is a 89 y.o. female seen today for an acute visit for poor appetite, denied abd pain, nausea, vomiting, constipation, or diarrhea, she is afebrile, no O2 desaturation,    Resolved cough, desaturation on and off, denied chest pain/palpitation, she is afebrile, currently treating UTI with Cipro-no s/s of UTI. Wbc 8.5, Hgb 11.0, plt 324, neutrophils 78.3, Iron 27, Vit B12 1179, Folate 6.6, CXR CHF mild left effusion.                           IDA, Hgb 9.6 10/07/22(12.9 07/17/22)<< Hgb 11.0 Iron 27, Vit B12 1179, Folate 6.6 10/09/22, start Fe 325mg  2x/wk, +FOBT x2/3 after Iron started.              Rectal bleed 10/02/22, no further occurrence, FOBT 10/02/22 negative, +2/3 10/13/22, Iron  started 10/09/22.  Denied abd pain, indigestion, N/V/D, or constipation.              UTI, on Cipro since 10/06/22 11/24/21 acute CVA left PCA embolic strokes, no apparent residual symptoms, on Eliquis f/u Neurology             CKD, Bun/creat 15/0.96 10/07/22             GERD, taking Protonix             Hypothyroidism, takes Levothyroxine, TSH 3.19 03/27/22             Hyponatremia, Na 135 10/07/22             Syncope: Orthostasis ?, vasovagal episode 08/31/22,  no further recurrence              A flutter, f/u Cardiology, off anticoagulation 2/2 falls in the past, restarted Eliquis after acute CVA 11/24/21, heart rate is controlled on Metoprolol             Hx of ovarian CA s/p TAH/BSO 03/2011, in remission.             Senile dementia,  SNF [facility] for supportive care, 01/29/22 MMSE 23/30             Constipation takes Colace. MiraLax qod             HTN, on Metoprolol,  off Amlodipine, prn Clonidine             OP, takes Ca, Vit D             Insomnia, off Ambien, off Melatonin. Prn Alprazolam.              Hyperlipidemia, on Atorvastatin, LDL 127 11/25/21             OA, chronic R ankle pain, uses ankle brace, mechanical lift for transfer,  w/c to get around.              Urinary frequency, on Mirabegron       Past Medical History:  Diagnosis Date   Anemia associated with acute blood loss 04/03/2011   Arthritis    knees    Ascites    Blood transfusion    hx of 2011    Complication of anesthesia    Sodium drops per pt    Cystitis    DIARRHEA, ANTIBIOTIC ASSOCIATED 05/31/2009   Qualifier: Diagnosis of  By: Daiva Eves MD, Cornelius     GERD (gastroesophageal reflux disease)    H/O hiatal hernia    Hyperlipidemia    Hypertension    Hyponatremia    Neutropenia with fever (HCC) 05/18/2011   OSTEOMYELITIS, CHRONIC, LOWER LEG 04/23/2009   Qualifier: Diagnosis of  By: Ninetta Lights MD, Linus Mako    OSTEOPOROSIS 04/23/2009   Qualifier: Diagnosis of  By: Ninetta Lights MD, Jeffrey     Ovarian  cancer (HCC) 01/25/2011   Pneumonia    hx of    Postmenopausal atrophic vaginitis    PROSTHETIC JOINT COMPLICATION 05/28/2009   Qualifier: Diagnosis of  By: Daiva Eves MD, Cornelius     Renal disorder    Decreased kidney function   Staph infection 2010   after knee replacement   Urinary frequency    Vulvitis    Past Surgical History:  Procedure Laterality Date   ABDOMINAL HYSTERECTOMY  04/01/2011   Procedure: HYSTERECTOMY ABDOMINAL;  Surgeon: Jeannette Corpus, MD;  Location: [facility];  Service: Gynecology;  Laterality: N/A;   APPENDECTOMY     JOINT REPLACEMENT     R knee in 2008, 5 operations on L knee   LAPAROTOMY  04/01/2011   Procedure: EXPLORATORY LAPAROTOMY;  Surgeon: Jeannette Corpus, MD;  Location: [facility];  Service: Gynecology;  Laterality: N/A;   OTHER SURGICAL HISTORY     hx of C section 1966   SALPINGOOPHORECTOMY  04/01/2011   Procedure: SALPINGO OOPHERECTOMY;  Surgeon: Jeannette Corpus, MD;  Location: [facility];  Service: Gynecology;  Laterality: Bilateral;   TONSILLECTOMY      Allergies  Allergen Reactions   Morphine And Codeine Anaphylaxis and Other (See Comments)    Given after knee replacement and code blue occurred   Morphine Sulfate Anaphylaxis   Penicillins Hives, Itching and Other (See Comments)    Syncope, also   Nsaids Other (See Comments)    Because of an abnormal kidney test result   Penicillin V Hives   Shellfish Allergy Nausea And Vomiting and Other (See Comments)    Pt has shellfish allergy only.  Has had IV contrast x 2 and did fine.   Shellfish-Derived Products Nausea And Vomiting    Allergies as of 10/14/2022       Reactions   Morphine And Codeine Anaphylaxis, Other (See Comments)   Given after knee replacement and code blue occurred   Morphine Sulfate Anaphylaxis   Penicillins  Hives, Itching, Other (See Comments)   Syncope, also   Nsaids Other (See Comments)   Because of an abnormal kidney test result   Penicillin V Hives    Shellfish Allergy Nausea And Vomiting, Other (See Comments)   Pt has shellfish allergy only.  Has had IV contrast x 2 and did fine.   Shellfish-derived Products Nausea And Vomiting        Medication List        Accurate as of October 14, 2022 11:59 PM. If you have any questions, ask your nurse or doctor.          STOP taking these medications    amLODipine 5 MG tablet Commonly known as: NORVASC Stopped by: Woods Gangemi X Edona Schreffler       TAKE these medications    acetaminophen 325 MG tablet Commonly known as: TYLENOL Take 650 mg by mouth every 4 (four) hours as needed for moderate pain or fever.   ALPRAZolam 0.5 MG tablet Commonly known as: XANAX Take 0.5 mg by mouth every 12 (twelve) hours as needed for anxiety.   apixaban 5 MG Tabs tablet Commonly known as: ELIQUIS Take 1 tablet (5 mg total) by mouth 2 (two) times daily.   atorvastatin 40 MG tablet Commonly known as: LIPITOR Take 1 tablet (40 mg total) by mouth daily.   B-COMPLEX/FOLIC ACID/VITAMIN C PO Take 1 tablet by mouth daily with breakfast.   bimatoprost 0.03 % ophthalmic solution Commonly known as: LUMIGAN Place 1 drop into both eyes at bedtime.   cloNIDine 0.1 MG tablet Commonly known as: CATAPRES Take 0.1 mg by mouth daily as needed (hypertension).   docusate sodium 100 MG capsule Commonly known as: COLACE Take 100 mg by mouth daily.   ferrous sulfate 325 (65 FE) MG EC tablet Take 325 mg by mouth 2 (two) times a week. Mon, Thrusday   furosemide 20 MG tablet Commonly known as: LASIX Take 10 mg by mouth daily.   lactose free nutrition Liqd Take 237 mLs by mouth 3 (three) times daily between meals.   levothyroxine 25 MCG tablet Commonly known as: SYNTHROID Take 25 mcg by mouth daily before breakfast.   metoprolol tartrate 25 MG tablet Commonly known as: LOPRESSOR Take 25 mg by mouth 2 (two) times daily.   Myrbetriq 25 MG Tb24 tablet Generic drug: mirabegron ER Take 25 mg by mouth daily.    nystatin powder Commonly known as: MYCOSTATIN/NYSTOP Apply 1 application  topically 2 (two) times daily as needed (reason not listed).   OXYGEN Inhale 2 L into the lungs as needed (if spo2 < 90 %).   pantoprazole 40 MG tablet Commonly known as: PROTONIX Take 40 mg by mouth daily.   polyethylene glycol 17 g packet Commonly known as: MIRALAX / GLYCOLAX Take 17 g by mouth every other day.   polyvinyl alcohol 1.4 % ophthalmic solution Commonly known as: LIQUIFILM TEARS Place 2 drops into both eyes in the morning, at noon, in the evening, and at bedtime.   potassium chloride 10 MEQ tablet Commonly known as: KLOR-CON Take 10 mEq by mouth daily.   sodium chloride 1 g tablet Take 1 g by mouth 2 (two) times daily.   Vitamin D3 50 MCG (2000 UT) Tabs Take 2,000 Units by mouth in the morning.        Review of Systems  Constitutional:  Positive for appetite change. Negative for fatigue and fever.  HENT:  Positive for hearing loss. Negative for congestion and trouble swallowing.  Eyes:  Negative for visual disturbance.  Respiratory:  Negative for cough, chest tightness, shortness of breath and wheezing.   Cardiovascular:  Positive for leg swelling. Negative for chest pain and palpitations.  Gastrointestinal:  Negative for abdominal pain, anal bleeding, blood in stool, constipation, diarrhea, nausea and vomiting.  Genitourinary:  Positive for frequency. Negative for difficulty urinating, dysuria, flank pain and urgency.  Musculoskeletal:  Positive for arthralgias, back pain and gait problem.       R ankle pain is chronic  Skin:        ecchymoses  Neurological:  Negative for speech difficulty, weakness and light-headedness.       Memory deficit  Psychiatric/Behavioral:  Positive for confusion. Negative for sleep disturbance. The patient is not nervous/anxious.     Immunization History  Administered Date(s) Administered   Influenza Split 11/13/2012, 12/13/2012, 11/30/2013,  10/09/2014, 11/24/2018   Influenza, High Dose Seasonal PF 12/01/2013, 10/04/2014, 10/12/2015, 11/06/2016, 11/12/2017   Influenza,inj,Quad PF,6+ Mos 10/24/2010   Influenza-Unspecified 10/24/2010, 11/01/2019, 12/04/2020   Moderna Covid-19 Vaccine Bivalent Booster 37yrs & up 07/24/2021   Moderna SARS-COV2 Booster Vaccination 07/18/2020   Moderna Sars-Covid-2 Vaccination 02/14/2019, 03/14/2019, 12/26/2019   PFIZER(Purple Top)SARS-COV-2 Vaccination 10/31/2020   PPD Test 11/27/2021   Pfizer Covid-19 Vaccine Bivalent Booster 29yrs & up 10/31/2020   Pneumococcal Conjugate-13 09/28/2013   Pneumococcal Polysaccharide-23 03/05/2012   Td 10/16/2010   Td (Adult) 08/27/2000   Tdap 08/27/2000, 10/16/2010   Zoster, Live 01/11/2013, 03/31/2020   Pertinent  Health Maintenance Due  Topic Date Due   INFLUENZA VACCINE  09/11/2022   DEXA SCAN  Completed      02/12/2022    4:30 PM 03/25/2022   11:07 AM 04/03/2022    3:37 PM 04/18/2022    9:19 AM 07/04/2022   10:17 AM  Fall Risk  Falls in the past year? 0 0 0 0 1  Was there an injury with Fall? 0 0 0 0 0  Fall Risk Category Calculator 0 0 0 0 2  Fall Risk Category (Retired) Low      (RETIRED) Patient Fall Risk Level High fall risk      Patient at Risk for Falls Due to History of fall(s);Impaired balance/gait;Impaired mobility;Mental status change History of fall(s);Impaired balance/gait;Impaired mobility;Mental status change History of fall(s);Impaired balance/gait;Impaired mobility;Mental status change History of fall(s);Impaired balance/gait;Impaired mobility;Mental status change History of fall(s);Impaired balance/gait;Impaired mobility;Mental status change  Fall risk Follow up Falls evaluation completed Falls evaluation completed Falls evaluation completed Falls evaluation completed Falls evaluation completed   Functional Status Survey:    Vitals:   10/14/22 1600  BP: (!) 148/70  Pulse: 66  Temp: 97.7 F (36.5 C)  SpO2: 93%  Weight: 185 lb 8 oz  (84.1 kg)  Height: 5\' 3"  (1.6 m)   Body mass index is 32.86 kg/m. Physical Exam Vitals and nursing note reviewed.  Constitutional:      Comments: fatigue  HENT:     Head: Normocephalic and atraumatic.     Mouth/Throat:     Mouth: Mucous membranes are moist.  Eyes:     Extraocular Movements: Extraocular movements intact.     Conjunctiva/sclera: Conjunctivae normal.     Pupils: Pupils are equal, round, and reactive to light.  Cardiovascular:     Rate and Rhythm: Normal rate and regular rhythm.  Pulmonary:     Effort: Pulmonary effort is normal.     Comments: Posterior left lower lung rales resolved.  Abdominal:     General: Bowel sounds are normal.  Palpations: Abdomen is soft.     Tenderness: There is no abdominal tenderness.     Hernia: A hernia is present.     Comments: Incision hernia  Musculoskeletal:     Cervical back: Normal range of motion and neck supple.     Right lower leg: Edema present.     Left lower leg: Edema present.     Comments: Right ankle brace, chronic pain. Trace-minimal-improved.   Skin:    General: Skin is warm and dry.  Neurological:     General: No focal deficit present.     Mental Status: She is alert. Mental status is at baseline.     Motor: No weakness.     Gait: Gait abnormal.     Comments: Oriented to person and her room on unit.   Psychiatric:        Mood and Affect: Mood normal.        Thought Content: Thought content normal.     Comments: Confused her locations.      Labs reviewed: Recent Labs    11/26/21 0420 01/22/22 0925 04/08/22 0755 08/31/22 1134 10/07/22 0000  NA 135 138 139 136  138 135*  K 3.7 4.5 4.3 3.9  4.3 4.1  CL 104 104 104 104  102 104  CO2 23 25 24* 24 24*  GLUCOSE 109* 125*  --  108*  105*  --   BUN 29* 21 24* 21  26* 15  CREATININE 1.23* 1.23* 1.2* 1.17*  1.30* 1.0  CALCIUM 8.7* 9.3 9.3 8.7* 8.1*   Recent Labs    11/24/21 1023 01/22/22 0925 08/31/22 1134 10/07/22 0000  AST 23 26 27 17    ALT 16 16 19 14   ALKPHOS 67 68 83 80  BILITOT 0.6 0.6 0.7  --   PROT 8.5* 7.1 7.2  --   ALBUMIN 4.2 3.5 3.4* 2.7*   Recent Labs    11/26/21 0420 01/22/22 0925 04/08/22 0755 08/31/22 1134 10/07/22 0000 10/09/22 0000  WBC 8.9 7.8   < > 9.0 7.3 8.5  NEUTROABS  --  6.3   < > 7.0 5,446.00 6,656.00  HGB 12.7 12.6   < > 12.7  13.6 9.6* 11.0*  HCT 40.1 40.6   < > 40.3  40.0 30* 34*  MCV 84.2 84.1  --  85.7  --   --   PLT 202 191   < > 227 315 324   < > = values in this interval not displayed.   Lab Results  Component Value Date   TSH 3.19 03/27/2022   Lab Results  Component Value Date   HGBA1C 5.7 (H) 11/24/2021   Lab Results  Component Value Date   CHOL 181 11/25/2021   HDL 37 (L) 11/25/2021   LDLCALC 127 (H) 11/25/2021   TRIG 85 11/25/2021   CHOLHDL 4.9 11/25/2021    Significant Diagnostic Results in last 30 days:  No results found.  Assessment/Plan: CHF (congestive heart failure) (HCC) Resolved cough deSat O2, edema BLE only trace, no left lung rales, continue Furosemide 10mg , f/u Cardiology ASAP, pending BMP/BNP  Anemia associated with acute blood loss  Hgb 9.6 10/07/22(12.9 07/17/22)<< Hgb 11.0 Iron 27, Vit B12 1179, Folate 6.6 10/09/22, start Fe 325mg  2x/wk, +FOBT x2/3 after Iron started.   Rectal bleed Rectal bleed 10/02/22, no further occurrence, FOBT 10/02/22 negative, +2/3 10/13/22, Iron started 10/09/22.  Denied abd pain, indigestion, N/V/D, or constipation.   Stroke (cerebrum) (HCC) 11/24/21 acute CVA left PCA embolic strokes,  no apparent residual symptoms, on Eliquis f/u Neurology  CKD (chronic kidney disease) stage 3, GFR 30-59 ml/min (HCC)  Bun/creat 15/0.96 10/07/22  GERD  taking Protonix  Hypothyroidism  takes Levothyroxine, TSH 3.19 03/27/22  Hyponatremia Na 135 10/07/22  Atrial flutter (HCC)  f/u Cardiology, off anticoagulation 2/2 falls in the past, restarted Eliquis after acute CVA 11/24/21, heart rate is controlled on Metoprolol.   Senile  dementia (HCC) SNF [facility] for supportive care, 01/29/22 MMSE 23/30  Slow transit constipation  takes Colace. MiraLax every other day, stable.  Essential hypertension  on Metoprolol, off Amlodipine, prn Clonidine  Insomnia  off Ambien, off Melatonin. Prn Alprazolam.   Urinary frequency on Mirabegron, at her baseline.     Family/ staff Communication: plan of care reviewed with the patient, the patient's daughter, and charge nurse.   Labs/tests ordered:  none  Time spend 35 minutes.

## 2022-10-14 NOTE — Assessment & Plan Note (Signed)
 Na 135 10/07/22

## 2022-10-14 NOTE — Assessment & Plan Note (Signed)
on Metoprolol, off Amlodipine, prn Clonidine

## 2022-10-14 NOTE — Assessment & Plan Note (Signed)
Hgb 9.6 10/07/22(12.9 07/17/22)<< Hgb 11.0 Iron 27, Vit B12 1179, Folate 6.6 10/09/22, start Fe 325mg  2x/wk, +FOBT x2/3 after Iron started.

## 2022-10-14 NOTE — Assessment & Plan Note (Signed)
SNF [facility] for supportive care, 01/29/22 MMSE 23/30 

## 2022-10-14 NOTE — Assessment & Plan Note (Signed)
off Ambien, off Melatonin. Prn Alprazolam.

## 2022-10-14 NOTE — Assessment & Plan Note (Signed)
Resolved cough deSat O2, edema BLE only trace, no left lung rales, continue Furosemide 10mg , f/u Cardiology ASAP, pending BMP/BNP

## 2022-10-14 NOTE — Assessment & Plan Note (Signed)
f/u Cardiology, off anticoagulation 2/2 falls in the past, restarted Eliquis after acute CVA 11/24/21, heart rate is controlled on Metoprolol.

## 2022-10-14 NOTE — Assessment & Plan Note (Signed)
takes Levothyroxine, TSH 3.19 03/27/22 

## 2022-10-15 ENCOUNTER — Other Ambulatory Visit: Payer: Self-pay | Admitting: Adult Health

## 2022-10-15 MED ORDER — ALPRAZOLAM 0.5 MG PO TABS: 0.5000 mg | ORAL_TABLET | Freq: Two times a day (BID) | ORAL | 0 refills | Status: DC | PRN

## 2022-10-16 ENCOUNTER — Encounter: Payer: Self-pay | Admitting: Cardiology

## 2022-10-16 ENCOUNTER — Encounter: Payer: Self-pay | Admitting: Nurse Practitioner

## 2022-10-16 ENCOUNTER — Ambulatory Visit: Payer: Medicare Other | Attending: Cardiology | Admitting: Cardiology

## 2022-10-16 VITALS — BP 132/84 | HR 80 | Ht 64.0 in | Wt 177.0 lb

## 2022-10-16 DIAGNOSIS — I480 Paroxysmal atrial fibrillation: Secondary | ICD-10-CM

## 2022-10-16 DIAGNOSIS — I350 Nonrheumatic aortic (valve) stenosis: Secondary | ICD-10-CM | POA: Diagnosis present

## 2022-10-16 DIAGNOSIS — I509 Heart failure, unspecified: Secondary | ICD-10-CM | POA: Diagnosis present

## 2022-10-16 NOTE — Progress Notes (Signed)
Cardiology Office Note:    Date:  10/16/2022   ID:  Rainey Pines, DOB October 28, 1929, MRN [id number]  PCP:  Mast, Man X, NP  Cardiologist:  [name], MD  Electrophysiologist:  None   Referring MD: Mast, Man X, NP   Chief Complaint  Patient presents with   Shortness of Breath    History of Present Illness:    [name] is a 89 y.o. female with a hx of atrial fibrillation, aCVA, ortic stenosis, ovarian cancer, CKD, hyperlipidemia, hypertension, obesity who presents for follow-up.  She was referred by Dr. Clarene Duke for evaluation of heart murmur, initially seen on 12/30/2018.  Echocardiogram on 01/04/2019 showed LVEF 60 to 65%, grade 1 diastolic dysfunction, normal RV function, mild aortic stenosis.    She was hospitalized in February 2022 with syncope thought to be due to orthostasis.  Ambien, Norvasc, nadolol were discontinued.  Echocardiogram showed no structural heart disease.  She was discharged with heart monitor.  30-day monitor on 06/04/2020 showed 4% A. fib/flutter burden with average rate 134 bpm and one 7 beat run of NSVT.  She was seen by Bettina Gavia, PA on 05/07/2020 given new diagnosis atrial fibrillation/flutter.  She has been having issues with dizziness/orthostasis and frequent falls, and decision was made to hold off on anticoagulation.  Discussed anticoagulation at clinic visit 09/2021, she declined.  She suffered acute CVA 11/2021, started on anticoagulation at that time.  Since last clinic visit, her daughter reports she is not been doing well recently.  She had COVID-19 3 to 4 weeks ago.  Has had cognitive changes since that time.  Had hypoxia with O2 down to 80s.  She started on Lasix last week, 10 mg daily.  Reports dyspnea has improved.  Denies any chest pain, lightheadedness, or syncope.   Past Medical History:  Diagnosis Date   Anemia associated with acute blood loss 04/03/2011   Arthritis    knees    Ascites    Blood transfusion    hx of 2011     Complication of anesthesia    Sodium drops per pt    Cystitis    DIARRHEA, ANTIBIOTIC ASSOCIATED 05/31/2009   Qualifier: Diagnosis of  By: Daiva Eves MD, Cornelius     GERD (gastroesophageal reflux disease)    H/O hiatal hernia    Hyperlipidemia    Hypertension    Hyponatremia    Neutropenia with fever (HCC) 05/18/2011   OSTEOMYELITIS, CHRONIC, LOWER LEG 04/23/2009   Qualifier: Diagnosis of  By: Ninetta Lights MD, Linus Mako    OSTEOPOROSIS 04/23/2009   Qualifier: Diagnosis of  By: Ninetta Lights MD, Jeffrey     Ovarian cancer (HCC) 01/25/2011   Pneumonia    hx of    Postmenopausal atrophic vaginitis    PROSTHETIC JOINT COMPLICATION 05/28/2009   Qualifier: Diagnosis of  By: Daiva Eves MD, Cornelius     Renal disorder    Decreased kidney function   Staph infection 2010   after knee replacement   Urinary frequency    Vulvitis     Past Surgical History:  Procedure Laterality Date   ABDOMINAL HYSTERECTOMY  04/01/2011   Procedure: HYSTERECTOMY ABDOMINAL;  Surgeon: Jeannette Corpus, MD;  Location: [facility];  Service: Gynecology;  Laterality: N/A;   APPENDECTOMY     JOINT REPLACEMENT     R knee in 2008, 5 operations on L knee   LAPAROTOMY  04/01/2011   Procedure: EXPLORATORY LAPAROTOMY;  Surgeon: Jeannette Corpus,  MD;  Location: [facility];  Service: Gynecology;  Laterality: N/A;   OTHER SURGICAL HISTORY     hx of C section 1966   SALPINGOOPHORECTOMY  04/01/2011   Procedure: SALPINGO OOPHERECTOMY;  Surgeon: Jeannette Corpus, MD;  Location: [facility];  Service: Gynecology;  Laterality: Bilateral;   TONSILLECTOMY      Current Medications: Current Meds  Medication Sig   acetaminophen (TYLENOL) 325 MG tablet Take 650 mg by mouth every 4 (four) hours as needed for moderate pain or fever.   ALPRAZolam (XANAX) 0.5 MG tablet Take 1 tablet (0.5 mg total) by mouth every 12 (twelve) hours as needed for anxiety.   apixaban (ELIQUIS) 5 MG TABS tablet Take 1 tablet (5 mg total) by mouth 2  (two) times daily.   atorvastatin (LIPITOR) 40 MG tablet Take 1 tablet (40 mg total) by mouth daily.   B Complex-C-Folic Acid (B-COMPLEX/FOLIC ACID/VITAMIN C PO) Take 1 tablet by mouth daily with breakfast.   bimatoprost (LUMIGAN) 0.03 % ophthalmic solution Place 1 drop into both eyes at bedtime.   Cholecalciferol (VITAMIN D3) 50 MCG (2000 UT) TABS Take 2,000 Units by mouth in the morning.   cloNIDine (CATAPRES) 0.1 MG tablet Take 0.1 mg by mouth daily as needed (hypertension).   docusate sodium (COLACE) 100 MG capsule Take 100 mg by mouth daily.   ferrous sulfate 325 (65 FE) MG EC tablet Take 325 mg by mouth 2 (two) times a week. Mon, Thrusday   furosemide (LASIX) 20 MG tablet Take 10 mg by mouth daily.   lactose free nutrition (BOOST) LIQD Take 237 mLs by mouth 3 (three) times daily between meals.   levothyroxine (SYNTHROID) 25 MCG tablet Take 25 mcg by mouth daily before breakfast.   metoprolol tartrate (LOPRESSOR) 25 MG tablet Take 25 mg by mouth 2 (two) times daily.   mirabegron ER (MYRBETRIQ) 25 MG TB24 tablet Take 25 mg by mouth daily.   nystatin (MYCOSTATIN/NYSTOP) powder Apply 1 application  topically 2 (two) times daily as needed (reason not listed).   OXYGEN Inhale 2 L into the lungs as needed (if spo2 < 90 %).   pantoprazole (PROTONIX) 40 MG tablet Take 40 mg by mouth daily.   polyethylene glycol (MIRALAX / GLYCOLAX) 17 g packet Take 17 g by mouth every other day.   polyvinyl alcohol (LIQUIFILM TEARS) 1.4 % ophthalmic solution Place 2 drops into both eyes in the morning, at noon, in the evening, and at bedtime.   potassium chloride (KLOR-CON) 10 MEQ tablet Take 10 mEq by mouth daily.   sodium chloride 1 g tablet Take 1 g by mouth 2 (two) times daily.     Allergies:   Morphine and codeine, Morphine sulfate, Penicillins, Nsaids, Penicillin v, Shellfish allergy, and Shellfish-derived products   Social History   Socioeconomic History   Marital status: Widowed    Spouse name: Not  on file   Number of children: Not on file   Years of education: Not on file   Highest education level: Not on file  Occupational History   Not on file  Tobacco Use   Smoking status: Former    Current packs/day: 0.00    Types: Cigarettes    Quit date: 02/11/1952    Years since quitting: 70.7   Smokeless tobacco: Never  Vaping Use   Vaping status: Never Used  Substance and Sexual Activity   Alcohol use: Not Currently   Drug use: Never   Sexual activity: Never  Other Topics Concern  Not on file  Social History Narrative   Not on file   Social Determinants of Health   Financial Resource Strain: Low Risk  (08/23/2021)   Overall Financial Resource Strain (CARDIA)    Difficulty of Paying Living Expenses: Not hard at all  Food Insecurity: No Food Insecurity (11/26/2021)   Hunger Vital Sign    Worried About Running Out of Food in the Last Year: Never true    Ran Out of Food in the Last Year: Never true  Transportation Needs: No Transportation Needs (11/26/2021)   PRAPARE - Administrator, Civil Service (Medical): No    Lack of Transportation (Non-Medical): No  Physical Activity: Insufficiently Active (08/23/2021)   Exercise Vital Sign    Days of Exercise per Week: 7 days    Minutes of Exercise per Session: 10 min  Stress: No Stress Concern Present (08/16/2020)   [employer] of Occupational Health - Occupational Stress Questionnaire    Feeling of Stress : Only a little  Social Connections: Socially Isolated (08/23/2021)   Social Connection and Isolation Panel [NHANES]    Frequency of Communication with Friends and Family: More than three times a week    Frequency of Social Gatherings with Friends and Family: Three times a week    Attends Religious Services: Never    Active Member of Clubs or Organizations: No    Attends Banker Meetings: Never    Marital Status: Widowed     Family History: The patient's family history includes Cancer in an other  family member; Diabetes in her brother; Heart attack in her brother.  ROS:   Please see the history of present illness.     All other systems reviewed and are negative.  EKGs/Labs/Other Studies Reviewed:    The following studies were reviewed today:  Cardiac Telemetry Monitoring 06/04/2020: 4% A. fib/flutter burden, average rate 134 bpm One 7 beat run of NSVT   Predominant rhythm is sinus rhythm. Range is 68-1 50 bpm with average of 92 bpm. No sustained ventricular tachycardia, significant pause, or high degree AV block.  4% A. fib/a flutter burden, average rate 134 bpm.  One 7 beat run of NSVT.  Total ventricular ectopy 3%.  1 patient triggered events, corresponding to sinus rhythm.   CT Angio Chest PE 04/16/2020: No evidence of pulmonary emboli.   Small right pleural effusion and bibasilar atelectatic changes.   Hiatal hernia with distal esophageal thickening likely related to reflux.   Aortic Atherosclerosis (ICD10-I70.0).  Echo 04/06/2020:  1. Left ventricular ejection fraction, by estimation, is 70 to 75%. The  left ventricle has hyperdynamic function. The left ventricle has no  regional wall motion abnormalities. There is mild left ventricular  hypertrophy. Left ventricular diastolic  parameters are consistent with Grade I diastolic dysfunction (impaired  relaxation).   2. Right ventricular systolic function is normal. The right ventricular  size is normal.   3. The mitral valve is normal in structure. No evidence of mitral valve  regurgitation. No evidence of mitral stenosis.   4. The aortic valve is tricuspid. Aortic valve regurgitation is mild.  Mild aortic valve stenosis. Aortic valve area, by VTI measures 1.91 cm.  Aortic valve mean gradient measures 10.0 mmHg. Aortic valve Vmax measures  2.12 m/s.   5. The inferior vena cava is normal in size with greater than 50%  respiratory variability, suggesting right atrial pressure of 3 mmHg.   EKG:   09/12/20: Normal sinus  rhythm, rate 82, poor  R wave progression 06/22/2020: NSR, rate 94 bpm, poor R wave progression 12/30/2018: normal sinus rhythm, rate 73, low voltage in precordial leads, no ST/T abnormalities  Recent Labs: 03/27/2022: TSH 3.19 10/07/2022: ALT 14; BUN 15; Creatinine 1.0; Potassium 4.1; Sodium 135 10/09/2022: Hemoglobin 11.0; Platelets 324  Recent Lipid Panel    Component Value Date/Time   CHOL 181 11/25/2021 0500   TRIG 85 11/25/2021 0500   HDL 37 (L) 11/25/2021 0500   CHOLHDL 4.9 11/25/2021 0500   VLDL 17 11/25/2021 0500   LDLCALC 127 (H) 11/25/2021 0500    Physical Exam:    VS:  BP 132/84   Pulse 80   Ht 5\' 4"  (1.626 m)   Wt 177 lb (80.3 kg)   SpO2 95%   BMI 30.38 kg/m     Wt Readings from Last 3 Encounters:  10/16/22 177 lb (80.3 kg)  10/14/22 185 lb 8 oz (84.1 kg)  10/10/22 185 lb 9.6 oz (84.2 kg)     GEN:  in no acute distress HEENT: Normal NECK: No JVD CARDIAC: RRR, 2/6 systolic murmur RESPIRATORY:  Clear to auscultation without rales, wheezing or rhonchi  ABDOMEN: Soft, non-tender, non-distended MUSCULOSKELETAL: No edema SKIN: Warm and dry NEUROLOGIC:  Alert and oriented x 3 PSYCHIATRIC:  Normal affect   ASSESSMENT:    1. Acute heart failure, unspecified heart failure type (HCC)   2. Aortic valve stenosis, etiology of cardiac valve disease unspecified   3. PAF (paroxysmal atrial fibrillation) (HCC)      PLAN:    ?Acute heart failure: Reports recent hypoxia at her facility, and started on Lasix 10 mg daily.  Currently appears euvolemic.  Check BMET/magnesium.  Will check echocardiogram  Paroxysmal atrial fibrillation: 30-day monitor on 06/04/2020 showed 4% A. fib/flutter burden with average rate 134 bpm and one 7 beat run of NSVT.  Was not started on anticoagulation initially due to frequent falls.  At clinic appointment 09/2021, anticoagulation was discussed in detail with patient and her son but patient was adamant about not doing anticoagulation given her  husband died from intracranial hemorrhage after a fall while on Plavix.  She presented with CVA 11/2021 and was started on Eliquis at that time. -Continue Eliquis and metoprolol  Aortic stenosis: Mild on echo 04/06/2020 (mean gradient 10 mmHg, AVA 1.9 cm).  Will monitor, repeat echocardiogram  Hypertension: Previously was on nadolol 20 mg daily, amlodipine 5 mg daily, lisinopril 20 mg daily.  All BP medicines have been held due to orthostatic hypotension and she was started on sodium tabs.  She is now just on metoprolol 25 mg twice daily.  RTC in 6 weeks  Medication Adjustments/Labs and Tests Ordered: Current medicines are reviewed at length with the patient today.  Concerns regarding medicines are outlined above.  Orders Placed This Encounter  Procedures   Magnesium   Basic metabolic panel   ECHOCARDIOGRAM COMPLETE   No orders of the defined types were placed in this encounter.   Patient Instructions  Medication Instructions:  No changes *If you need a refill on your cardiac medications before your next appointment, please call your pharmacy*   Lab Work: BMP, Magnesium- Please draw lab work at [facility] If you have labs (blood work) drawn today and your tests are completely normal, you will receive your results only by: [employer] (if you have MyChart) OR A paper copy in the mail If you have any lab test that is abnormal or we need to change your treatment, we will call  you to review the results.   Testing/Procedures: Your physician has requested that you have an echocardiogram. Echocardiography is a painless test that uses sound waves to create images of your heart. It provides your doctor with information about the size and shape of your heart and how well your heart's chambers and valves are working. This procedure takes approximately one hour. There are no restrictions for this procedure. Please do NOT wear cologne, perfume, aftershave, or lotions (deodorant is  allowed). Please arrive 15 minutes prior to your appointment time.    Follow-Up: At [facility], you and your health needs are our priority.  As part of our continuing mission to provide you with exceptional heart care, we have created designated Provider Care Teams.  These Care Teams include your primary Cardiologist (physician) and Advanced Practice Providers (APPs -  Physician Assistants and Nurse Practitioners) who all work together to provide you with the care you need, when you need it.  We recommend signing up for the patient portal called "MyChart".  Sign up information is provided on this After Visit Summary.  MyChart is used to connect with patients for Virtual Visits (Telemedicine).  Patients are able to view lab/test results, encounter notes, upcoming appointments, etc.  Non-urgent messages can be sent to your provider as well.   To learn more about what you can do with MyChart, go to [email or web address].    Your next appointment:   6 weeks with APP  3 months with Dr Bjorn Pippin     Signed, [name], MD  10/16/2022 9:59 PM    [facility]

## 2022-10-16 NOTE — Patient Instructions (Signed)
Medication Instructions:  No changes *If you need a refill on your cardiac medications before your next appointment, please call your pharmacy*   Lab Work: BMP, Magnesium- Please draw lab work at [facility] If you have labs (blood work) drawn today and your tests are completely normal, you will receive your results only by: [employer] (if you have MyChart) OR A paper copy in the mail If you have any lab test that is abnormal or we need to change your treatment, we will call you to review the results.   Testing/Procedures: Your physician has requested that you have an echocardiogram. Echocardiography is a painless test that uses sound waves to create images of your heart. It provides your doctor with information about the size and shape of your heart and how well your heart's chambers and valves are working. This procedure takes approximately one hour. There are no restrictions for this procedure. Please do NOT wear cologne, perfume, aftershave, or lotions (deodorant is allowed). Please arrive 15 minutes prior to your appointment time.    Follow-Up: At [facility], you and your health needs are our priority.  As part of our continuing mission to provide you with exceptional heart care, we have created designated Provider Care Teams.  These Care Teams include your primary Cardiologist (physician) and Advanced Practice Providers (APPs -  Physician Assistants and Nurse Practitioners) who all work together to provide you with the care you need, when you need it.  We recommend signing up for the patient portal called "MyChart".  Sign up information is provided on this After Visit Summary.  MyChart is used to connect with patients for Virtual Visits (Telemedicine).  Patients are able to view lab/test results, encounter notes, upcoming appointments, etc.  Non-urgent messages can be sent to your provider as well.   To learn more about what you can do with MyChart, go to  [email or web address].    Your next appointment:   6 weeks with APP  3 months with Dr Bjorn Pippin

## 2022-10-17 ENCOUNTER — Telehealth: Payer: Self-pay | Admitting: Cardiology

## 2022-10-17 ENCOUNTER — Non-Acute Institutional Stay (SKILLED_NURSING_FACILITY): Payer: Medicare Other | Admitting: Nurse Practitioner

## 2022-10-17 ENCOUNTER — Encounter: Payer: Self-pay | Admitting: Nurse Practitioner

## 2022-10-17 DIAGNOSIS — I639 Cerebral infarction, unspecified: Secondary | ICD-10-CM | POA: Diagnosis not present

## 2022-10-17 DIAGNOSIS — I509 Heart failure, unspecified: Secondary | ICD-10-CM

## 2022-10-17 DIAGNOSIS — I4892 Unspecified atrial flutter: Secondary | ICD-10-CM

## 2022-10-17 DIAGNOSIS — K219 Gastro-esophageal reflux disease without esophagitis: Secondary | ICD-10-CM

## 2022-10-17 DIAGNOSIS — D62 Acute posthemorrhagic anemia: Secondary | ICD-10-CM | POA: Diagnosis not present

## 2022-10-17 DIAGNOSIS — N1831 Chronic kidney disease, stage 3a: Secondary | ICD-10-CM | POA: Diagnosis not present

## 2022-10-17 DIAGNOSIS — R350 Frequency of micturition: Secondary | ICD-10-CM

## 2022-10-17 DIAGNOSIS — R55 Syncope and collapse: Secondary | ICD-10-CM

## 2022-10-17 DIAGNOSIS — E871 Hypo-osmolality and hyponatremia: Secondary | ICD-10-CM

## 2022-10-17 DIAGNOSIS — I10 Essential (primary) hypertension: Secondary | ICD-10-CM

## 2022-10-17 DIAGNOSIS — M159 Polyosteoarthritis, unspecified: Secondary | ICD-10-CM

## 2022-10-17 DIAGNOSIS — F0390 Unspecified dementia without behavioral disturbance: Secondary | ICD-10-CM

## 2022-10-17 DIAGNOSIS — E039 Hypothyroidism, unspecified: Secondary | ICD-10-CM

## 2022-10-17 NOTE — Telephone Encounter (Signed)
We had requested for labs to be drawn for the patient. RN Lanora Manis at [facility] stated the lab order was put in with [facility]. They do not use [facility], instead [facility]. Lanora Manis would like to know if we would be okay with them using [facility] instead. Lanora Manis stated they typically draw labs on Tuesdays or Thursdays and would like to know if we would want them to draw blood today or wait till next week. Please advise.

## 2022-10-17 NOTE — Assessment & Plan Note (Signed)
 taking Protonix

## 2022-10-17 NOTE — Assessment & Plan Note (Signed)
Bun/creat 11/0.91 10/16/22

## 2022-10-17 NOTE — Assessment & Plan Note (Signed)
 chronic R ankle pain, uses ankle brace, mechanical lift for transfer,  w/c to get around.

## 2022-10-17 NOTE — Telephone Encounter (Signed)
Spoke to [employer] with [facility] she was calling to report patient will have a bmet and magnesium done Tue 9/10 with [facility].She will fax Dr.Schumann results.

## 2022-10-17 NOTE — Telephone Encounter (Signed)
Called Lanora Manis RN at [facility] left message on her voice mail to call back.

## 2022-10-17 NOTE — Telephone Encounter (Signed)
Returned call to Lanora Manis, Charity fundraiser at [facility] at [phone]. Pt has labs ordered through [facility] but this facility only uses [facility]. They will draw if they can get the doctors NPI number. Please advise. Pt is non ambulatory and has no way to get to a [facility].

## 2022-10-17 NOTE — Assessment & Plan Note (Signed)
Hgb 9.6 10/07/22(12.9 07/17/22)<< Hgb 11.0 Iron 27, Vit B12 1179, Folate 6.6 10/09/22, start Fe 325mg  2x/wk, +FOBT x2/3 after Iron started.

## 2022-10-17 NOTE — Assessment & Plan Note (Signed)
SNF [facility] for supportive care, 01/29/22 MMSE 23/30  Anxiety, prn Alprazolam

## 2022-10-17 NOTE — Assessment & Plan Note (Signed)
Loose control Bp,  on Metoprolol, off Amlodipine, prn Clonidine

## 2022-10-17 NOTE — Telephone Encounter (Signed)
[name] from [facility] returned call.

## 2022-10-17 NOTE — Progress Notes (Unsigned)
Location:  [facility] Room Number: N043-A Place of Service:  SNF (31) Provider:  Branna Cortina X, NP  Patient Care Team: Humna Moorehouse X, NP as PCP - General (Internal Medicine) [name], MD as PCP - Cardiology (Cardiology) Olivia Mackie, MD as Consulting Physician (Obstetrics and Gynecology) De Blanch, MD as Consulting Physician (Gynecology) Cleda Mccreedy, MD as Consulting Physician (Gynecologic Oncology)  Extended Emergency Contact Information Primary Emergency Contact: [facility] Address: [address]          [place], [place] [address] Darden [place] of [place] Home Phone: [phone] Mobile Phone: [phone] Relation: Daughter  Code Status:  Full Code Goals of care: Advanced Directive information    10/17/2022    9:03 AM  Advanced Directives  Does Patient Have a Medical Advance Directive? Yes  Type of Advance Directive Living will;Healthcare Power of Attorney  Does patient want to make changes to medical advance directive? No - Patient declined  Copy of Healthcare Power of Attorney in Chart? Yes - validated most recent copy scanned in chart (See row information)     Chief Complaint  Patient presents with  . Medical Management of Chronic Issues    HPI:  Pt is a 89 y.o. female seen today for managing chronic medical conditions.   CHF, compensated clinically, BLE edema and cough improved, on Furosemide, [facility] CXR CHF mild left effusion. saw Cardiology 10/15/22, no change of medication, f/u Mg, BMP at [facility] 10/21/22, scheduled echocardiogram   IDA, Hgb 9.6 10/07/22(12.9 07/17/22)<< Hgb 11.0 Iron 27, Vit B12 1179, Folate 6.6 10/09/22, start Fe 325mg  2x/wk, +FOBT x2/3 after Iron started.              Rectal bleed 10/02/22, no further occurrence, FOBT 10/02/22 negative, +2/3 10/13/22, Iron started 10/09/22.  Denied abd pain, indigestion, N/V/D, or constipation.              UTI, on Cipro since 10/06/22 11/24/21 acute CVA left PCA embolic  strokes, no apparent residual symptoms, on Eliquis f/u Neurology             CKD, Bun/creat 11/0.91 10/16/22             GERD, taking Protonix             Hypothyroidism, takes Levothyroxine, TSH 3.19 03/27/22             Hyponatremia, Na 134 10/16/22             Syncope: Orthostasis ?, vasovagal episode 08/31/22,  no further recurrence              A flutter/Afib, f/u Cardiology 10/16/22, off anticoagulation 2/2 falls in the past, restarted Eliquis after acute CVA 11/24/21, heart rate is controlled on Metoprolol             Hx of ovarian CA s/p TAH/BSO 03/2011, in remission.             Senile dementia,  SNF [facility] for supportive care, 01/29/22 MMSE 23/30  Anxiety, prn Alprazolam             Constipation takes Colace. MiraLax qod             HTN, on Metoprolol, off Amlodipine, prn Clonidine             OP, takes Ca, Vit D             Insomnia, off Ambien, off Melatonin. Prn Alprazolam.  Hyperlipidemia, on Atorvastatin, LDL 127 11/25/21             OA, chronic R ankle pain, uses ankle brace, mechanical lift for transfer,  w/c to get around.              Urinary frequency, on Mirabegron       Past Medical History:  Diagnosis Date  . Anemia associated with acute blood loss 04/03/2011  . Arthritis    knees   . Ascites   . Blood transfusion    hx of 2011   . Complication of anesthesia    Sodium drops per pt   . Cystitis   . DIARRHEA, ANTIBIOTIC ASSOCIATED 05/31/2009   Qualifier: Diagnosis of  By: Daiva Eves MD, Remi Haggard    . GERD (gastroesophageal reflux disease)   . H/O hiatal hernia   . Hyperlipidemia   . Hypertension   . Hyponatremia   . Neutropenia with fever (HCC) 05/18/2011  . OSTEOMYELITIS, CHRONIC, LOWER LEG 04/23/2009   Qualifier: Diagnosis of  By: Ninetta Lights MD, Tinnie Gens    . Osteopenia   . OSTEOPOROSIS 04/23/2009   Qualifier: Diagnosis of  By: Ninetta Lights MD, Tinnie Gens    . Ovarian cancer (HCC) 01/25/2011  . Pneumonia    hx of   . Postmenopausal atrophic vaginitis   . PROSTHETIC  JOINT COMPLICATION 05/28/2009   Qualifier: Diagnosis of  By: Daiva Eves MD, Remi Haggard    . Renal disorder    Decreased kidney function  . Staph infection 2010   after knee replacement  . Urinary frequency   . Vulvitis    Past Surgical History:  Procedure Laterality Date  . ABDOMINAL HYSTERECTOMY  04/01/2011   Procedure: HYSTERECTOMY ABDOMINAL;  Surgeon: Jeannette Corpus, MD;  Location: [facility];  Service: Gynecology;  Laterality: N/A;  . APPENDECTOMY    . JOINT REPLACEMENT     R knee in 2008, 5 operations on L knee  . LAPAROTOMY  04/01/2011   Procedure: EXPLORATORY LAPAROTOMY;  Surgeon: Jeannette Corpus, MD;  Location: [facility];  Service: Gynecology;  Laterality: N/A;  . OTHER SURGICAL HISTORY     hx of C section 1966  . SALPINGOOPHORECTOMY  04/01/2011   Procedure: SALPINGO OOPHERECTOMY;  Surgeon: Jeannette Corpus, MD;  Location: [facility];  Service: Gynecology;  Laterality: Bilateral;  . TONSILLECTOMY      Allergies  Allergen Reactions  . Morphine And Codeine Anaphylaxis and Other (See Comments)    Given after knee replacement and code blue occurred  . Morphine Sulfate Anaphylaxis  . Penicillins Hives, Itching and Other (See Comments)    Syncope, also  . Nsaids Other (See Comments)    Because of an abnormal kidney test result  . Penicillin V Hives  . Shellfish Allergy Nausea And Vomiting and Other (See Comments)    Pt has shellfish allergy only.  Has had IV contrast x 2 and did fine.  Marland Kitchen Shellfish-Derived Products Nausea And Vomiting    Outpatient Encounter Medications as of 10/17/2022  Medication Sig  . acetaminophen (TYLENOL) 325 MG tablet Take 650 mg by mouth every 4 (four) hours as needed for moderate pain or fever.  . ALPRAZolam (XANAX) 0.5 MG tablet Take 1 tablet (0.5 mg total) by mouth every 12 (twelve) hours as needed for anxiety.  Marland Kitchen apixaban (ELIQUIS) 5 MG TABS tablet Take 1 tablet (5 mg total) by mouth 2 (two) times daily.  Marland Kitchen atorvastatin (LIPITOR) 40 MG  tablet Take 1 tablet (40 mg total) by mouth  daily.  . B Complex-C-Folic Acid (B-COMPLEX/FOLIC ACID/VITAMIN C PO) Take 1 tablet by mouth daily with breakfast.  . bimatoprost (LUMIGAN) 0.03 % ophthalmic solution Place 1 drop into both eyes at bedtime.  . Cholecalciferol (VITAMIN D3) 50 MCG (2000 UT) TABS Take 2,000 Units by mouth in the morning.  . cloNIDine (CATAPRES) 0.1 MG tablet Take 0.1 mg by mouth daily as needed (hypertension).  Marland Kitchen docusate sodium (COLACE) 100 MG capsule Take 100 mg by mouth daily.  . ferrous sulfate 325 (65 FE) MG EC tablet Take 325 mg by mouth 2 (two) times a week. Mon, Thrusday  . furosemide (LASIX) 20 MG tablet Take 10 mg by mouth daily.  Marland Kitchen lactose free nutrition (BOOST) LIQD Take 237 mLs by mouth 3 (three) times daily between meals.  Marland Kitchen levothyroxine (SYNTHROID) 25 MCG tablet Take 25 mcg by mouth daily before breakfast.  . metoprolol tartrate (LOPRESSOR) 25 MG tablet Take 25 mg by mouth 2 (two) times daily.  . mirabegron ER (MYRBETRIQ) 25 MG TB24 tablet Take 25 mg by mouth daily.  Marland Kitchen nystatin (MYCOSTATIN/NYSTOP) powder Apply 1 application  topically 2 (two) times daily as needed (reason not listed).  . OXYGEN Inhale 2 L into the lungs as needed (if spo2 < 90 %).  . pantoprazole (PROTONIX) 40 MG tablet Take 40 mg by mouth daily.  . polyethylene glycol (MIRALAX / GLYCOLAX) 17 g packet Take 17 g by mouth every other day.  . polyvinyl alcohol (LIQUIFILM TEARS) 1.4 % ophthalmic solution Place 2 drops into both eyes in the morning, at noon, in the evening, and at bedtime.  . potassium chloride (KLOR-CON) 10 MEQ tablet Take 10 mEq by mouth daily.  . sodium chloride 1 g tablet Take 1 g by mouth 2 (two) times daily.   No facility-administered encounter medications on file as of 10/17/2022.    Review of Systems  Constitutional:  Negative for appetite change, fatigue and fever.  HENT:  Positive for hearing loss. Negative for congestion and trouble swallowing.   Eyes:  Negative  for visual disturbance.  Respiratory:  Negative for cough, chest tightness, shortness of breath and wheezing.   Cardiovascular:  Positive for leg swelling. Negative for chest pain and palpitations.  Gastrointestinal:  Negative for abdominal pain and constipation.  Genitourinary:  Positive for frequency. Negative for difficulty urinating, dysuria, flank pain and urgency.  Musculoskeletal:  Positive for arthralgias, back pain and gait problem.       R ankle pain is chronic  Skin:        ecchymoses  Neurological:  Negative for speech difficulty, weakness and light-headedness.       Memory deficit  Psychiatric/Behavioral:  Positive for confusion. Negative for sleep disturbance. The patient is not nervous/anxious.     Immunization History  Administered Date(s) Administered  . Influenza Split 11/13/2012, 12/13/2012, 11/30/2013, 10/09/2014, 11/24/2018  . Influenza, High Dose Seasonal PF 12/01/2013, 10/04/2014, 10/12/2015, 11/06/2016, 11/12/2017  . Influenza,inj,Quad PF,6+ Mos 10/24/2010  . Influenza-Unspecified 10/24/2010, 11/01/2019, 12/04/2020  . Moderna Covid-19 Vaccine Bivalent Booster 48yrs & up 07/24/2021  . Moderna SARS-COV2 Booster Vaccination 07/18/2020  . Moderna Sars-Covid-2 Vaccination 02/14/2019, 03/14/2019, 12/26/2019  . PFIZER(Purple Top)SARS-COV-2 Vaccination 10/31/2020  . PPD Test 11/27/2021  . Research officer, [employer] 53yrs & up 10/31/2020  . Pneumococcal Conjugate-13 09/28/2013  . Pneumococcal Polysaccharide-23 03/05/2012  . Td 10/16/2010  . Td (Adult) 08/27/2000  . Tdap 08/27/2000, 10/16/2010  . Zoster, Live 01/11/2013, 03/31/2020   Pertinent  Health Maintenance Due  Topic  Date Due  . INFLUENZA VACCINE  09/11/2022  . DEXA SCAN  Completed      02/12/2022    4:30 PM 03/25/2022   11:07 AM 04/03/2022    3:37 PM 04/18/2022    9:19 AM 07/04/2022   10:17 AM  Fall Risk  Falls in the past year? 0 0 0 0 1  Was there an injury with Fall? 0 0 0 0 0  Fall Risk  Category Calculator 0 0 0 0 2  Fall Risk Category (Retired) Low      (RETIRED) Patient Fall Risk Level High fall risk      Patient at Risk for Falls Due to History of fall(s);Impaired balance/gait;Impaired mobility;Mental status change History of fall(s);Impaired balance/gait;Impaired mobility;Mental status change History of fall(s);Impaired balance/gait;Impaired mobility;Mental status change History of fall(s);Impaired balance/gait;Impaired mobility;Mental status change History of fall(s);Impaired balance/gait;Impaired mobility;Mental status change  Fall risk Follow up Falls evaluation completed Falls evaluation completed Falls evaluation completed Falls evaluation completed Falls evaluation completed   Functional Status Survey:    Vitals:   10/17/22 0849  BP: (!) 193/85  Pulse: 82  Resp: 18  Temp: 97.7 F (36.5 C)  SpO2: 97%  Weight: 177 lb 3.2 oz (80.4 kg)  Height: 5\' 4"  (1.626 m)   Body mass index is 30.42 kg/m. Physical Exam Vitals and nursing note reviewed.  Constitutional:      Appearance: Normal appearance.  HENT:     Head: Normocephalic and atraumatic.     Mouth/Throat:     Mouth: Mucous membranes are moist.  Eyes:     Extraocular Movements: Extraocular movements intact.     Conjunctiva/sclera: Conjunctivae normal.     Pupils: Pupils are equal, round, and reactive to light.  Cardiovascular:     Rate and Rhythm: Normal rate and regular rhythm.  Pulmonary:     Effort: Pulmonary effort is normal.     Breath sounds: Rales present.     Comments: Posterior left lower lung rales  Abdominal:     General: Bowel sounds are normal.     Palpations: Abdomen is soft.     Tenderness: There is no abdominal tenderness.     Hernia: A hernia is present.     Comments: Incision hernia  Musculoskeletal:     Cervical back: Normal range of motion and neck supple.     Right lower leg: Edema present.     Left lower leg: Edema present.     Comments: Right ankle brace, chronic pain.  Trace-minimal-improved.   Skin:    General: Skin is warm and dry.  Neurological:     General: No focal deficit present.     Mental Status: She is alert. Mental status is at baseline.     Motor: No weakness.     Gait: Gait abnormal.     Comments: Oriented to person and her room on unit.   Psychiatric:        Mood and Affect: Mood normal.        Thought Content: Thought content normal.     Comments: Confused her locations.     Labs reviewed: Recent Labs    11/26/21 0420 01/22/22 0925 04/08/22 0755 08/31/22 1134 10/07/22 0000  NA 135 138 139 136  138 135*  K 3.7 4.5 4.3 3.9  4.3 4.1  CL 104 104 104 104  102 104  CO2 23 25 24* 24 24*  GLUCOSE 109* 125*  --  108*  105*  --   BUN 29* 21  24* 21  26* 15  CREATININE 1.23* 1.23* 1.2* 1.17*  1.30* 1.0  CALCIUM 8.7* 9.3 9.3 8.7* 8.1*   Recent Labs    11/24/21 1023 01/22/22 0925 08/31/22 1134 10/07/22 0000  AST 23 26 27 17   ALT 16 16 19 14   ALKPHOS 67 68 83 80  BILITOT 0.6 0.6 0.7  --   PROT 8.5* 7.1 7.2  --   ALBUMIN 4.2 3.5 3.4* 2.7*   Recent Labs    11/26/21 0420 01/22/22 0925 04/08/22 0755 08/31/22 1134 10/07/22 0000 10/09/22 0000  WBC 8.9 7.8   < > 9.0 7.3 8.5  NEUTROABS  --  6.3   < > 7.0 5,446.00 6,656.00  HGB 12.7 12.6   < > 12.7  13.6 9.6* 11.0*  HCT 40.1 40.6   < > 40.3  40.0 30* 34*  MCV 84.2 84.1  --  85.7  --   --   PLT 202 191   < > 227 315 324   < > = values in this interval not displayed.   Lab Results  Component Value Date   TSH 3.19 03/27/2022   Lab Results  Component Value Date   HGBA1C 5.7 (H) 11/24/2021   Lab Results  Component Value Date   CHOL 181 11/25/2021   HDL 37 (L) 11/25/2021   LDLCALC 127 (H) 11/25/2021   TRIG 85 11/25/2021   CHOLHDL 4.9 11/25/2021    Significant Diagnostic Results in last 30 days:  No results found.  Assessment/Plan CHF (congestive heart failure) (HCC) compensated clinically, BLE edema and cough improved, on Furosemide, [facility] CXR CHF mild left  effusion. saw Cardiology 10/15/22, no change of medication, f/u Mg, BMP at [facility] 10/21/22, scheduled echocardiogram  Anemia associated with acute blood loss Hgb 9.6 10/07/22(12.9 07/17/22)<< Hgb 11.0 Iron 27, Vit B12 1179, Folate 6.6 10/09/22, start Fe 325mg  2x/wk, +FOBT x2/3 after Iron started.   Stroke (cerebrum) (HCC) 11/24/21 acute CVA left PCA embolic strokes, no apparent residual symptoms, on Eliquis f/u Neurology  CKD (chronic kidney disease) stage 3, GFR 30-59 ml/min (HCC)  Bun/creat 11/0.91 10/16/22  GERD  taking Protonix  Hypothyroidism takes Levothyroxine, TSH 3.19 03/27/22  Hyponatremia Na 134 10/16/22  Syncope  Orthostasis ?, vasovagal episode 08/31/22,  no further recurrence   Atrial flutter (HCC) f/u Cardiology 10/16/22, off anticoagulation 2/2 falls in the past, restarted Eliquis after acute CVA 11/24/21, heart rate is controlled on Metoprolol  Senile dementia (HCC)  SNF [facility] for supportive care, 01/29/22 MMSE 23/30  Anxiety, prn Alprazolam  Essential hypertension Loose control Bp,  on Metoprolol, off Amlodipine, prn Clonidine  Osteoarthritis chronic R ankle pain, uses ankle brace, mechanical lift for transfer,  w/c to get around.   Urinary frequency No change, on Mirabegron       Family/ staff Communication: plan of care reviewed with the patient and charge nurse.   Labs/tests ordered:  none  Time spend 35 minutes.

## 2022-10-17 NOTE — Assessment & Plan Note (Signed)
Orthostasis ?, vasovagal episode 08/31/22,  no further recurrence

## 2022-10-17 NOTE — Assessment & Plan Note (Signed)
No change, on Mirabegron 

## 2022-10-17 NOTE — Assessment & Plan Note (Signed)
takes Levothyroxine, TSH 3.19 03/27/22 

## 2022-10-17 NOTE — Assessment & Plan Note (Signed)
f/u Cardiology 10/16/22, off anticoagulation 2/2 falls in the past, restarted Eliquis after acute CVA 11/24/21, heart rate is controlled on Metoprolol

## 2022-10-17 NOTE — Assessment & Plan Note (Signed)
11/24/21 acute CVA left PCA embolic strokes, no apparent residual symptoms, on Eliquis f/u Neurology

## 2022-10-17 NOTE — Assessment & Plan Note (Signed)
compensated clinically, BLE edema and cough improved, on Furosemide, [facility] CXR CHF mild left effusion. saw Cardiology 10/15/22, no change of medication, f/u Mg, BMP at [facility] 10/21/22, scheduled echocardiogram

## 2022-10-17 NOTE — Assessment & Plan Note (Signed)
Na 134 10/16/22

## 2022-10-20 ENCOUNTER — Non-Acute Institutional Stay (SKILLED_NURSING_FACILITY): Payer: Self-pay | Admitting: Nurse Practitioner

## 2022-10-20 ENCOUNTER — Encounter: Payer: Self-pay | Admitting: Nurse Practitioner

## 2022-10-20 DIAGNOSIS — E039 Hypothyroidism, unspecified: Secondary | ICD-10-CM

## 2022-10-20 DIAGNOSIS — B3749 Other urogenital candidiasis: Secondary | ICD-10-CM | POA: Diagnosis not present

## 2022-10-20 DIAGNOSIS — I639 Cerebral infarction, unspecified: Secondary | ICD-10-CM

## 2022-10-20 DIAGNOSIS — F410 Panic disorder [episodic paroxysmal anxiety] without agoraphobia: Secondary | ICD-10-CM

## 2022-10-20 DIAGNOSIS — I509 Heart failure, unspecified: Secondary | ICD-10-CM

## 2022-10-20 DIAGNOSIS — I4892 Unspecified atrial flutter: Secondary | ICD-10-CM

## 2022-10-20 DIAGNOSIS — R350 Frequency of micturition: Secondary | ICD-10-CM

## 2022-10-20 DIAGNOSIS — N1831 Chronic kidney disease, stage 3a: Secondary | ICD-10-CM

## 2022-10-20 DIAGNOSIS — F0390 Unspecified dementia without behavioral disturbance: Secondary | ICD-10-CM

## 2022-10-20 DIAGNOSIS — K5901 Slow transit constipation: Secondary | ICD-10-CM

## 2022-10-20 DIAGNOSIS — I10 Essential (primary) hypertension: Secondary | ICD-10-CM

## 2022-10-20 DIAGNOSIS — D509 Iron deficiency anemia, unspecified: Secondary | ICD-10-CM

## 2022-10-20 DIAGNOSIS — B372 Candidiasis of skin and nail: Secondary | ICD-10-CM | POA: Insufficient documentation

## 2022-10-20 DIAGNOSIS — E871 Hypo-osmolality and hyponatremia: Secondary | ICD-10-CM

## 2022-10-20 DIAGNOSIS — K219 Gastro-esophageal reflux disease without esophagitis: Secondary | ICD-10-CM

## 2022-10-20 NOTE — Assessment & Plan Note (Signed)
Stable, taking Protonix

## 2022-10-20 NOTE — Assessment & Plan Note (Signed)
At baseline,  on Mirabegron

## 2022-10-20 NOTE — Assessment & Plan Note (Signed)
Hgb 9.6 10/07/22(12.9 07/17/22)<< Hgb 11.0 Iron 27, Vit B12 1179, Folate 6.6 10/09/22, start Fe 325mg  2x/wk, +FOBT x2/3 after Iron started.

## 2022-10-20 NOTE — Assessment & Plan Note (Signed)
Stable, takes Colace. MiraLax qod

## 2022-10-20 NOTE — Assessment & Plan Note (Signed)
prn Alprazolam.

## 2022-10-20 NOTE — Assessment & Plan Note (Signed)
urogenital/perineal redness, incontinent B+B, blood in diaper 10/18/22, no further occurrence.

## 2022-10-20 NOTE — Assessment & Plan Note (Signed)
Na 134 10/16/22

## 2022-10-20 NOTE — Assessment & Plan Note (Signed)
f/u Cardiology 10/16/22, off anticoagulation 2/2 falls in the past, restarted Eliquis after acute CVA 11/24/21, heart rate is controlled on Metoprolol

## 2022-10-20 NOTE — Assessment & Plan Note (Signed)
Bun/creat 11/0.91 10/16/22

## 2022-10-20 NOTE — Assessment & Plan Note (Signed)
acute CVA left PCA embolic strokes, no apparent residual symptoms, on Eliquis f/u Neurology

## 2022-10-20 NOTE — Assessment & Plan Note (Signed)
SNF [facility] for supportive care, 01/29/22 MMSE 23/30 

## 2022-10-20 NOTE — Progress Notes (Unsigned)
Location:   SNF [facility] Room Number: 24 Place of Service:  SNF (31) Provider: Arna Snipe Remington Skalsky NP  Holden Draughon X, NP  Patient Care Team: Dezzie Badilla X, NP as PCP - General (Internal Medicine) [name], MD as PCP - Cardiology (Cardiology) Olivia Mackie, MD as Consulting Physician (Obstetrics and Gynecology) De Blanch, MD as Consulting Physician (Gynecology) Cleda Mccreedy, MD as Consulting Physician (Gynecologic Oncology)  Extended Emergency Contact Information Primary Emergency Contact: [facility] Address: [address]          [place], [place] [address] Darden [place] of [place] Home Phone: [phone] Mobile Phone: [phone] Relation: Daughter  Code Status: DNR Goals of care: Advanced Directive information    10/17/2022    9:03 AM  Advanced Directives  Does Patient Have a Medical Advance Directive? Yes  Type of Advance Directive Living will;Healthcare Power of Attorney  Does patient want to make changes to medical advance directive? No - Patient declined  Copy of Healthcare Power of Attorney in Chart? Yes - validated most recent copy scanned in chart (See row information)     Chief Complaint  Patient presents with  . Acute Visit    Perineal redness    HPI:  Pt is a 89 y.o. female seen today for an acute visit for urogenital/perineal redness, incontinent B+B, blood in diaper 10/18/22, no further occurrence.    CHF, compensated clinically, BLE edema and cough improved, on Furosemide, [facility] CXR CHF mild left effusion. saw Cardiology 10/15/22, no change of medication, f/u Mg, BMP at [facility] 10/21/22, scheduled echocardiogram              IDA, Hgb 9.6 10/07/22(12.9 07/17/22)<< Hgb 11.0 Iron 27, Vit B12 1179, Folate 6.6 10/09/22, start Fe 325mg  2x/wk, +FOBT x2/3 after Iron started.              Rectal bleed 10/02/22, 10/18/22 found blood on diaper,  FOBT 10/02/22 negative, +2/3 10/13/22, Iron started 10/09/22.  Denied abd pain, indigestion, N/V/D, or  constipation.              UTI, on Cipro since 10/06/22 11/24/21 acute CVA left PCA embolic strokes, no apparent residual symptoms, on Eliquis f/u Neurology             CKD, Bun/creat 11/0.91 10/16/22             GERD, taking Protonix             Hypothyroidism, takes Levothyroxine, TSH 3.19 03/27/22             Hyponatremia, Na 134 10/16/22             Syncope: Orthostasis ?, vasovagal episode 08/31/22,  no further recurrence              A flutter/Afib, f/u Cardiology 10/16/22, off anticoagulation 2/2 falls in the past, restarted Eliquis after acute CVA 11/24/21, heart rate is controlled on Metoprolol             Hx of ovarian CA s/p TAH/BSO 03/2011, in remission.             Senile dementia,  SNF [facility] for supportive care, 01/29/22 MMSE 23/30             Anxiety, prn Alprazolam             Constipation takes Colace. MiraLax qod             HTN, on Metoprolol, off Amlodipine, prn Clonidine  OP, takes Ca, Vit D             Insomnia, off Ambien, off Melatonin. Prn Alprazolam.              Hyperlipidemia, on Atorvastatin, LDL 127 11/25/21             OA, chronic R ankle pain, uses ankle brace, mechanical lift for transfer,  w/c to get around.              Urinary frequency, on Mirabegron   Past Medical History:  Diagnosis Date  . Anemia associated with acute blood loss 04/03/2011  . Arthritis    knees   . Ascites   . Blood transfusion    hx of 2011   . Complication of anesthesia    Sodium drops per pt   . Cystitis   . DIARRHEA, ANTIBIOTIC ASSOCIATED 05/31/2009   Qualifier: Diagnosis of  By: Daiva Eves MD, Remi Haggard    . GERD (gastroesophageal reflux disease)   . H/O hiatal hernia   . Hyperlipidemia   . Hypertension   . Hyponatremia   . Neutropenia with fever (HCC) 05/18/2011  . OSTEOMYELITIS, CHRONIC, LOWER LEG 04/23/2009   Qualifier: Diagnosis of  By: Ninetta Lights MD, Tinnie Gens    . Osteopenia   . OSTEOPOROSIS 04/23/2009   Qualifier: Diagnosis of  By: Ninetta Lights MD, Tinnie Gens    . Ovarian  cancer (HCC) 01/25/2011  . Pneumonia    hx of   . Postmenopausal atrophic vaginitis   . PROSTHETIC JOINT COMPLICATION 05/28/2009   Qualifier: Diagnosis of  By: Daiva Eves MD, Remi Haggard    . Renal disorder    Decreased kidney function  . Staph infection 2010   after knee replacement  . Urinary frequency   . Vulvitis    Past Surgical History:  Procedure Laterality Date  . ABDOMINAL HYSTERECTOMY  04/01/2011   Procedure: HYSTERECTOMY ABDOMINAL;  Surgeon: Jeannette Corpus, MD;  Location: [facility];  Service: Gynecology;  Laterality: N/A;  . APPENDECTOMY    . JOINT REPLACEMENT     R knee in 2008, 5 operations on L knee  . LAPAROTOMY  04/01/2011   Procedure: EXPLORATORY LAPAROTOMY;  Surgeon: Jeannette Corpus, MD;  Location: [facility];  Service: Gynecology;  Laterality: N/A;  . OTHER SURGICAL HISTORY     hx of C section 1966  . SALPINGOOPHORECTOMY  04/01/2011   Procedure: SALPINGO OOPHERECTOMY;  Surgeon: Jeannette Corpus, MD;  Location: [facility];  Service: Gynecology;  Laterality: Bilateral;  . TONSILLECTOMY      Allergies  Allergen Reactions  . Morphine And Codeine Anaphylaxis and Other (See Comments)    Given after knee replacement and code blue occurred  . Morphine Sulfate Anaphylaxis  . Penicillins Hives, Itching and Other (See Comments)    Syncope, also  . Nsaids Other (See Comments)    Because of an abnormal kidney test result  . Penicillin V Hives  . Shellfish Allergy Nausea And Vomiting and Other (See Comments)    Pt has shellfish allergy only.  Has had IV contrast x 2 and did fine.  Marland Kitchen Shellfish-Derived Products Nausea And Vomiting    Allergies as of 10/20/2022       Reactions   Morphine And Codeine Anaphylaxis, Other (See Comments)   Given after knee replacement and code blue occurred   Morphine Sulfate Anaphylaxis   Penicillins Hives, Itching, Other (See Comments)   Syncope, also   Nsaids Other (See Comments)   Because of an  abnormal kidney test result    Penicillin V Hives   Shellfish Allergy Nausea And Vomiting, Other (See Comments)   Pt has shellfish allergy only.  Has had IV contrast x 2 and did fine.   Shellfish-derived Products Nausea And Vomiting        Medication List        Accurate as of October 20, 2022 11:59 PM. If you have any questions, ask your nurse or doctor.          acetaminophen 325 MG tablet Commonly known as: TYLENOL Take 650 mg by mouth every 4 (four) hours as needed for moderate pain or fever.   ALPRAZolam 0.5 MG tablet Commonly known as: XANAX Take 1 tablet (0.5 mg total) by mouth every 12 (twelve) hours as needed for anxiety.   apixaban 5 MG Tabs tablet Commonly known as: ELIQUIS Take 1 tablet (5 mg total) by mouth 2 (two) times daily.   atorvastatin 40 MG tablet Commonly known as: LIPITOR Take 1 tablet (40 mg total) by mouth daily.   B-COMPLEX/FOLIC ACID/VITAMIN C PO Take 1 tablet by mouth daily with breakfast.   bimatoprost 0.03 % ophthalmic solution Commonly known as: LUMIGAN Place 1 drop into both eyes at bedtime.   cloNIDine 0.1 MG tablet Commonly known as: CATAPRES Take 0.1 mg by mouth daily as needed (hypertension).   docusate sodium 100 MG capsule Commonly known as: COLACE Take 100 mg by mouth daily.   ferrous sulfate 325 (65 FE) MG EC tablet Take 325 mg by mouth 2 (two) times a week. Mon, Thrusday   furosemide 20 MG tablet Commonly known as: LASIX Take 10 mg by mouth daily.   lactose free nutrition Liqd Take 237 mLs by mouth 3 (three) times daily between meals.   levothyroxine 25 MCG tablet Commonly known as: SYNTHROID Take 25 mcg by mouth daily before breakfast.   metoprolol tartrate 25 MG tablet Commonly known as: LOPRESSOR Take 25 mg by mouth 2 (two) times daily.   Myrbetriq 25 MG Tb24 tablet Generic drug: mirabegron ER Take 25 mg by mouth daily.   nystatin powder Commonly known as: MYCOSTATIN/NYSTOP Apply 1 application  topically 2 (two) times daily as  needed (reason not listed).   OXYGEN Inhale 2 L into the lungs as needed (if spo2 < 90 %).   pantoprazole 40 MG tablet Commonly known as: PROTONIX Take 40 mg by mouth daily.   polyethylene glycol 17 g packet Commonly known as: MIRALAX / GLYCOLAX Take 17 g by mouth every other day.   polyvinyl alcohol 1.4 % ophthalmic solution Commonly known as: LIQUIFILM TEARS Place 2 drops into both eyes in the morning, at noon, in the evening, and at bedtime.   potassium chloride 10 MEQ tablet Commonly known as: KLOR-CON Take 10 mEq by mouth daily.   sodium chloride 1 g tablet Take 1 g by mouth 2 (two) times daily.   Vitamin D3 50 MCG (2000 UT) Tabs Take 2,000 Units by mouth in the morning.        Review of Systems  Constitutional:  Negative for appetite change, fatigue and fever.  HENT:  Positive for hearing loss. Negative for congestion and trouble swallowing.   Eyes:  Negative for visual disturbance.  Respiratory:  Negative for cough, chest tightness, shortness of breath and wheezing.   Cardiovascular:  Positive for leg swelling. Negative for chest pain and palpitations.  Gastrointestinal:  Negative for abdominal pain and constipation.  Genitourinary:  Positive for frequency. Negative for difficulty  urinating, dysuria, flank pain and urgency.  Musculoskeletal:  Positive for arthralgias, back pain and gait problem.       R ankle pain is chronic  Skin:        Perineal area redness.   Neurological:  Negative for speech difficulty, weakness and light-headedness.       Memory deficit  Psychiatric/Behavioral:  Positive for confusion. Negative for sleep disturbance. The patient is not nervous/anxious.     Immunization History  Administered Date(s) Administered  . Influenza Split 11/13/2012, 12/13/2012, 11/30/2013, 10/09/2014, 11/24/2018  . Influenza, High Dose Seasonal PF 12/01/2013, 10/04/2014, 10/12/2015, 11/06/2016, 11/12/2017  . Influenza,inj,Quad PF,6+ Mos 10/24/2010  .  Influenza-Unspecified 10/24/2010, 11/01/2019, 12/04/2020  . Moderna Covid-19 Vaccine Bivalent Booster 40yrs & up 07/24/2021  . Moderna SARS-COV2 Booster Vaccination 07/18/2020  . Moderna Sars-Covid-2 Vaccination 02/14/2019, 03/14/2019, 12/26/2019  . PFIZER(Purple Top)SARS-COV-2 Vaccination 10/31/2020  . PPD Test 11/27/2021  . [employer], [employer] 44yrs & up 10/31/2020  . Pneumococcal Conjugate-13 09/28/2013  . Pneumococcal Polysaccharide-23 03/05/2012  . Td 10/16/2010  . Td (Adult) 08/27/2000  . Tdap 08/27/2000, 10/16/2010  . Zoster, Live 01/11/2013, 03/31/2020   Pertinent  Health Maintenance Due  Topic Date Due  . INFLUENZA VACCINE  09/11/2022  . DEXA SCAN  Completed      02/12/2022    4:30 PM 03/25/2022   11:07 AM 04/03/2022    3:37 PM 04/18/2022    9:19 AM 07/04/2022   10:17 AM  Fall Risk  Falls in the past year? 0 0 0 0 1  Was there an injury with Fall? 0 0 0 0 0  Fall Risk Category Calculator 0 0 0 0 2  Fall Risk Category (Retired) Low      (RETIRED) Patient Fall Risk Level High fall risk      Patient at Risk for Falls Due to History of fall(s);Impaired balance/gait;Impaired mobility;Mental status change History of fall(s);Impaired balance/gait;Impaired mobility;Mental status change History of fall(s);Impaired balance/gait;Impaired mobility;Mental status change History of fall(s);Impaired balance/gait;Impaired mobility;Mental status change History of fall(s);Impaired balance/gait;Impaired mobility;Mental status change  Fall risk Follow up Falls evaluation completed Falls evaluation completed Falls evaluation completed Falls evaluation completed Falls evaluation completed   Functional Status Survey:    Vitals:   10/20/22 1514  BP: 132/72  Pulse: 77  Resp: (!) 22  Temp: 97.7 F (36.5 C)  SpO2: 95%  Weight: 181 lb 4.8 oz (82.2 kg)   Body mass index is 31.12 kg/m. Physical Exam Vitals and nursing note reviewed.  Constitutional:      Appearance:  Normal appearance.  HENT:     Head: Normocephalic and atraumatic.     Mouth/Throat:     Mouth: Mucous membranes are moist.  Eyes:     Extraocular Movements: Extraocular movements intact.     Conjunctiva/sclera: Conjunctivae normal.     Pupils: Pupils are equal, round, and reactive to light.  Cardiovascular:     Rate and Rhythm: Normal rate and regular rhythm.  Pulmonary:     Effort: Pulmonary effort is normal.     Breath sounds: Rales present.     Comments: Posterior left lower lung rales  Abdominal:     General: Bowel sounds are normal.     Palpations: Abdomen is soft.     Tenderness: There is no abdominal tenderness.     Hernia: A hernia is present.     Comments: Incision hernia  Musculoskeletal:     Cervical back: Normal range of motion and neck supple.  Right lower leg: Edema present.     Left lower leg: Edema present.     Comments: Right ankle brace, chronic pain. Trace-minimal-improved.   Skin:    General: Skin is warm and dry.     Findings: Erythema present.     Comments: Urogenital area redness.   Neurological:     General: No focal deficit present.     Mental Status: She is alert. Mental status is at baseline.     Motor: No weakness.     Gait: Gait abnormal.     Comments: Oriented to person and her room on unit.   Psychiatric:        Mood and Affect: Mood normal.        Thought Content: Thought content normal.     Comments: Confused her locations.     Labs reviewed: Recent Labs    11/26/21 0420 01/22/22 0925 04/08/22 0755 08/31/22 1134 10/07/22 0000  NA 135 138 139 136  138 135*  K 3.7 4.5 4.3 3.9  4.3 4.1  CL 104 104 104 104  102 104  CO2 23 25 24* 24 24*  GLUCOSE 109* 125*  --  108*  105*  --   BUN 29* 21 24* 21  26* 15  CREATININE 1.23* 1.23* 1.2* 1.17*  1.30* 1.0  CALCIUM 8.7* 9.3 9.3 8.7* 8.1*   Recent Labs    11/24/21 1023 01/22/22 0925 08/31/22 1134 10/07/22 0000  AST 23 26 27 17   ALT 16 16 19 14   ALKPHOS 67 68 83 80   BILITOT 0.6 0.6 0.7  --   PROT 8.5* 7.1 7.2  --   ALBUMIN 4.2 3.5 3.4* 2.7*   Recent Labs    11/26/21 0420 01/22/22 0925 04/08/22 0755 08/31/22 1134 10/07/22 0000 10/09/22 0000  WBC 8.9 7.8   < > 9.0 7.3 8.5  NEUTROABS  --  6.3   < > 7.0 5,446.00 6,656.00  HGB 12.7 12.6   < > 12.7  13.6 9.6* 11.0*  HCT 40.1 40.6   < > 40.3  40.0 30* 34*  MCV 84.2 84.1  --  85.7  --   --   PLT 202 191   < > 227 315 324   < > = values in this interval not displayed.   Lab Results  Component Value Date   TSH 3.19 03/27/2022   Lab Results  Component Value Date   HGBA1C 5.7 (H) 11/24/2021   Lab Results  Component Value Date   CHOL 181 11/25/2021   HDL 37 (L) 11/25/2021   LDLCALC 127 (H) 11/25/2021   TRIG 85 11/25/2021   CHOLHDL 4.9 11/25/2021    Significant Diagnostic Results in last 30 days:  No results found.  Assessment/Plan: Urogenital candidiasis urogenital/perineal redness, incontinent B+B, blood in diaper 10/18/22, no further occurrence.  Will assist with patient with incontinent care, apply skin barrier oint, try Nystatin powder bid until healed.   CHF (congestive heart failure) (HCC) urogenital/perineal redness, incontinent B+B, blood in diaper 10/18/22, no further occurrence.   Iron deficiency anemia  Hgb 9.6 10/07/22(12.9 07/17/22)<< Hgb 11.0 Iron 27, Vit B12 1179, Folate 6.6 10/09/22, start Fe 325mg  2x/wk, +FOBT x2/3 after Iron started.   Stroke (cerebrum) (HCC) acute CVA left PCA embolic strokes, no apparent residual symptoms, on Eliquis f/u Neurology  CKD (chronic kidney disease) stage 3, GFR 30-59 ml/min (HCC) Bun/creat 11/0.91 10/16/22  GERD Stable, taking Protonix  Hypothyroidism  takes Levothyroxine, TSH 3.19 03/27/22  Hyponatremia Na 134  10/16/22  Atrial flutter (HCC) f/u Cardiology 10/16/22, off anticoagulation 2/2 falls in the past, restarted Eliquis after acute CVA 11/24/21, heart rate is controlled on Metoprolol  Senile dementia (HCC)  SNF [facility] for  supportive care, 01/29/22 MMSE 23/30  Panic disorder prn Alprazolam  Slow transit constipation Stable,  takes Colace. MiraLax qod  Essential hypertension Blood pressure is controlled,  on Metoprolol, off Amlodipine, prn Clonidine  Urinary frequency At baseline,  on Mirabegron    Family/ staff Communication: plan of care reviewed with the patient and charge nurse.   Labs/tests ordered:  none  Time spend 35 minutes.

## 2022-10-20 NOTE — Assessment & Plan Note (Signed)
Blood pressure is controlled, on Metoprolol, off Amlodipine, prn Clonidine

## 2022-10-20 NOTE — Assessment & Plan Note (Signed)
takes Levothyroxine, TSH 3.19 03/27/22 

## 2022-10-20 NOTE — Assessment & Plan Note (Signed)
urogenital/perineal redness, incontinent B+B, blood in diaper 10/18/22, no further occurrence.  Will assist with patient with incontinent care, apply skin barrier oint, try Nystatin powder bid until healed.

## 2022-10-21 LAB — LAB REPORT - SCANNED: EGFR: 70

## 2022-10-23 ENCOUNTER — Other Ambulatory Visit: Payer: Self-pay

## 2022-10-23 ENCOUNTER — Telehealth: Payer: Self-pay | Admitting: Cardiology

## 2022-10-23 NOTE — Telephone Encounter (Addendum)
Returned call to daughter and she states she would like for Nurse Elnita Maxwell to return her call

## 2022-10-23 NOTE — Telephone Encounter (Signed)
Patient's daughter is returning call to discuss lab results. 

## 2022-10-24 ENCOUNTER — Encounter: Payer: Self-pay | Admitting: Nurse Practitioner

## 2022-10-24 ENCOUNTER — Non-Acute Institutional Stay (SKILLED_NURSING_FACILITY): Payer: Medicare Other | Admitting: Nurse Practitioner

## 2022-10-24 DIAGNOSIS — N1831 Chronic kidney disease, stage 3a: Secondary | ICD-10-CM

## 2022-10-24 DIAGNOSIS — F0390 Unspecified dementia without behavioral disturbance: Secondary | ICD-10-CM

## 2022-10-24 DIAGNOSIS — E039 Hypothyroidism, unspecified: Secondary | ICD-10-CM

## 2022-10-24 DIAGNOSIS — R350 Frequency of micturition: Secondary | ICD-10-CM

## 2022-10-24 DIAGNOSIS — E871 Hypo-osmolality and hyponatremia: Secondary | ICD-10-CM

## 2022-10-24 DIAGNOSIS — B372 Candidiasis of skin and nail: Secondary | ICD-10-CM | POA: Diagnosis not present

## 2022-10-24 DIAGNOSIS — K219 Gastro-esophageal reflux disease without esophagitis: Secondary | ICD-10-CM

## 2022-10-24 DIAGNOSIS — I4892 Unspecified atrial flutter: Secondary | ICD-10-CM

## 2022-10-24 DIAGNOSIS — I10 Essential (primary) hypertension: Secondary | ICD-10-CM

## 2022-10-24 DIAGNOSIS — I639 Cerebral infarction, unspecified: Secondary | ICD-10-CM

## 2022-10-24 DIAGNOSIS — K5901 Slow transit constipation: Secondary | ICD-10-CM

## 2022-10-24 DIAGNOSIS — F410 Panic disorder [episodic paroxysmal anxiety] without agoraphobia: Secondary | ICD-10-CM | POA: Diagnosis not present

## 2022-10-24 DIAGNOSIS — M159 Polyosteoarthritis, unspecified: Secondary | ICD-10-CM | POA: Diagnosis not present

## 2022-10-24 DIAGNOSIS — I509 Heart failure, unspecified: Secondary | ICD-10-CM

## 2022-10-24 NOTE — Assessment & Plan Note (Signed)
Bun/creat 15/0.8 10/21/22

## 2022-10-24 NOTE — Assessment & Plan Note (Signed)
Stable, takes Colace. MiraLax qod

## 2022-10-24 NOTE — Assessment & Plan Note (Signed)
Incontinent of bladder, may consider dc Mirabegron in the future.

## 2022-10-24 NOTE — Assessment & Plan Note (Signed)
Blood pressure is controlled, on Metoprolol, off Amlodipine, prn Clonidine

## 2022-10-24 NOTE — Progress Notes (Unsigned)
Location:   SNF [facility] Room Number: 43A Place of Service:  SNF (31) Provider: Arna Snipe Gennavieve Huq NP  Quindon Denker X, NP  Patient Care Team: Aadan Chenier X, NP as PCP - General (Internal Medicine) [name], MD as PCP - Cardiology (Cardiology) Olivia Mackie, MD as Consulting Physician (Obstetrics and Gynecology) De Blanch, MD as Consulting Physician (Gynecology) Cleda Mccreedy, MD as Consulting Physician (Gynecologic Oncology)  Extended Emergency Contact Information Primary Emergency Contact: [facility] Address: [address]          [place], [place] [address] [place] of [place] Home Phone: [phone] Mobile Phone: [phone] Relation: Daughter  Code Status: DNR Goals of care: Advanced Directive information    10/17/2022    9:03 AM  Advanced Directives  Does Patient Have a Medical Advance Directive? Yes  Type of Advance Directive Living will;Healthcare Power of Attorney  Does patient want to make changes to medical advance directive? No - Patient declined  Copy of Healthcare Power of Attorney in Chart? Yes - validated most recent copy scanned in chart (See row information)     Chief Complaint  Patient presents with  . Acute Visit    R+L buttock irritated redness.     HPI:  Pt is a 89 y.o. female seen today for an acute visit for R+L buttock irritated rash, superficial openings, incontinent of B+B   CHF, compensated clinically, BLE edema and cough improved, on Furosemide, [facility] CXR CHF mild left effusion. saw Cardiology 10/15/22, no change of medication, placed on Mg, pending BMP/Mg, scheduled echocardiogram              IDA, Hgb 9.6 10/07/22(12.9 07/17/22)<< Hgb 11.0 Iron 27, Vit B12 1179, Folate 6.6 10/09/22, start Fe 325mg  2x/wk, +FOBT x2/3 after Iron started.              Rectal bleed 10/02/22, 10/18/22 found blood on diaper,  FOBT 10/02/22 negative, +2/3 10/13/22, Iron started 10/09/22.  Denied abd pain, indigestion, N/V/D, or constipation.               UTI, treated with Cipro since 10/06/22 11/24/21 acute CVA left PCA embolic strokes, no apparent residual symptoms, on Eliquis f/u Neurology             CKD, Bun/creat 15/0.8 10/21/22             GERD, taking Protonix             Hypothyroidism, takes Levothyroxine, TSH 3.19 03/27/22             Hyponatremia, Na 133 10/21/22             Syncope: Orthostasis ?, vasovagal episode 08/31/22,  no further recurrence              A flutter/Afib, f/u Cardiology 10/16/22, off anticoagulation 2/2 falls in the past, restarted Eliquis after acute CVA 11/24/21, heart rate is controlled on Metoprolol             Hx of ovarian CA s/p TAH/BSO 03/2011, in remission.             Senile dementia,  SNF [facility] for supportive care, 01/29/22 MMSE 23/30             Anxiety, prn Alprazolam             Constipation takes Colace. MiraLax qod             HTN, on Metoprolol, off Amlodipine, prn Clonidine  OP, takes Ca, Vit D             Insomnia, off Ambien, off Melatonin. Prn Alprazolam.              Hyperlipidemia, on Atorvastatin, LDL 127 11/25/21             OA, chronic R ankle pain, uses ankle brace, mechanical lift for transfer,  w/c to get around.              Urinary frequency, on Mirabegron        Past Medical History:  Diagnosis Date  . Anemia associated with acute blood loss 04/03/2011  . Arthritis    knees   . Ascites   . Blood transfusion    hx of 2011   . Complication of anesthesia    Sodium drops per pt   . Cystitis   . DIARRHEA, ANTIBIOTIC ASSOCIATED 05/31/2009   Qualifier: Diagnosis of  By: Daiva Eves MD, Remi Haggard    . GERD (gastroesophageal reflux disease)   . H/O hiatal hernia   . Hyperlipidemia   . Hypertension   . Hyponatremia   . Neutropenia with fever (HCC) 05/18/2011  . OSTEOMYELITIS, CHRONIC, LOWER LEG 04/23/2009   Qualifier: Diagnosis of  By: Ninetta Lights MD, Tinnie Gens    . Osteopenia   . OSTEOPOROSIS 04/23/2009   Qualifier: Diagnosis of  By: Ninetta Lights MD, Tinnie Gens    . Ovarian  cancer (HCC) 01/25/2011  . Pneumonia    hx of   . Postmenopausal atrophic vaginitis   . PROSTHETIC JOINT COMPLICATION 05/28/2009   Qualifier: Diagnosis of  By: Daiva Eves MD, Remi Haggard    . Renal disorder    Decreased kidney function  . Staph infection 2010   after knee replacement  . Urinary frequency   . Vulvitis    Past Surgical History:  Procedure Laterality Date  . ABDOMINAL HYSTERECTOMY  04/01/2011   Procedure: HYSTERECTOMY ABDOMINAL;  Surgeon: Jeannette Corpus, MD;  Location: [facility];  Service: Gynecology;  Laterality: N/A;  . APPENDECTOMY    . JOINT REPLACEMENT     R knee in 2008, 5 operations on L knee  . LAPAROTOMY  04/01/2011   Procedure: EXPLORATORY LAPAROTOMY;  Surgeon: Jeannette Corpus, MD;  Location: [facility];  Service: Gynecology;  Laterality: N/A;  . OTHER SURGICAL HISTORY     hx of C section 1966  . SALPINGOOPHORECTOMY  04/01/2011   Procedure: SALPINGO OOPHERECTOMY;  Surgeon: Jeannette Corpus, MD;  Location: [facility];  Service: Gynecology;  Laterality: Bilateral;  . TONSILLECTOMY      Allergies  Allergen Reactions  . Morphine And Codeine Anaphylaxis and Other (See Comments)    Given after knee replacement and code blue occurred  . Morphine Sulfate Anaphylaxis  . Penicillins Hives, Itching and Other (See Comments)    Syncope, also  . Nsaids Other (See Comments)    Because of an abnormal kidney test result  . Penicillin V Hives  . Shellfish Allergy Nausea And Vomiting and Other (See Comments)    Pt has shellfish allergy only.  Has had IV contrast x 2 and did fine.  Marland Kitchen Shellfish-Derived Products Nausea And Vomiting    Allergies as of 10/24/2022       Reactions   Morphine And Codeine Anaphylaxis, Other (See Comments)   Given after knee replacement and code blue occurred   Morphine Sulfate Anaphylaxis   Penicillins Hives, Itching, Other (See Comments)   Syncope, also   Nsaids Other (See Comments)  Because of an abnormal kidney test result    Penicillin V Hives   Shellfish Allergy Nausea And Vomiting, Other (See Comments)   Pt has shellfish allergy only.  Has had IV contrast x 2 and did fine.   Shellfish-derived Products Nausea And Vomiting        Medication List        Accurate as of October 24, 2022 11:59 PM. If you have any questions, ask your nurse or doctor.          acetaminophen 325 MG tablet Commonly known as: TYLENOL Take 650 mg by mouth every 4 (four) hours as needed for moderate pain or fever.   ALPRAZolam 0.5 MG tablet Commonly known as: XANAX Take 1 tablet (0.5 mg total) by mouth every 12 (twelve) hours as needed for anxiety.   apixaban 5 MG Tabs tablet Commonly known as: ELIQUIS Take 1 tablet (5 mg total) by mouth 2 (two) times daily.   atorvastatin 40 MG tablet Commonly known as: LIPITOR Take 1 tablet (40 mg total) by mouth daily.   B-COMPLEX/FOLIC ACID/VITAMIN C PO Take 1 tablet by mouth daily with breakfast.   bimatoprost 0.03 % ophthalmic solution Commonly known as: LUMIGAN Place 1 drop into both eyes at bedtime.   cloNIDine 0.1 MG tablet Commonly known as: CATAPRES Take 0.1 mg by mouth daily as needed (hypertension).   docusate sodium 100 MG capsule Commonly known as: COLACE Take 100 mg by mouth daily.   ferrous sulfate 325 (65 FE) MG EC tablet Take 325 mg by mouth 2 (two) times a week. Mon, Thrusday   furosemide 20 MG tablet Commonly known as: LASIX Take 10 mg by mouth daily.   lactose free nutrition Liqd Take 237 mLs by mouth 3 (three) times daily between meals.   levothyroxine 25 MCG tablet Commonly known as: SYNTHROID Take 25 mcg by mouth daily before breakfast.   Magnesium Oxide 400 MG Caps Take 400 mg daily   metoprolol tartrate 25 MG tablet Commonly known as: LOPRESSOR Take 25 mg by mouth 2 (two) times daily.   Myrbetriq 25 MG Tb24 tablet Generic drug: mirabegron ER Take 25 mg by mouth daily.   nystatin powder Commonly known as: MYCOSTATIN/NYSTOP Apply  1 application  topically 2 (two) times daily as needed (reason not listed).   OXYGEN Inhale 2 L into the lungs as needed (if spo2 < 90 %).   pantoprazole 40 MG tablet Commonly known as: PROTONIX Take 40 mg by mouth daily.   polyethylene glycol 17 g packet Commonly known as: MIRALAX / GLYCOLAX Take 17 g by mouth every other day.   polyvinyl alcohol 1.4 % ophthalmic solution Commonly known as: LIQUIFILM TEARS Place 2 drops into both eyes in the morning, at noon, in the evening, and at bedtime.   potassium chloride 10 MEQ tablet Commonly known as: KLOR-CON Take 10 mEq by mouth daily.   sodium chloride 1 g tablet Take 1 g by mouth 2 (two) times daily.   Vitamin D3 50 MCG (2000 UT) Tabs Take 2,000 Units by mouth in the morning.        Review of Systems  Constitutional:  Negative for appetite change, fatigue and fever.  HENT:  Positive for hearing loss. Negative for congestion and trouble swallowing.   Eyes:  Negative for visual disturbance.  Respiratory:  Negative for cough.   Cardiovascular:  Negative for leg swelling.  Gastrointestinal:  Negative for abdominal pain and constipation.  Genitourinary:  Positive for frequency. Negative for  dysuria and urgency.  Musculoskeletal:  Positive for arthralgias, back pain and gait problem.       R ankle pain is chronic  Skin:  Positive for rash.  Neurological:  Negative for speech difficulty, weakness and light-headedness.       Memory deficit  Psychiatric/Behavioral:  Positive for confusion. Negative for sleep disturbance. The patient is not nervous/anxious.     Immunization History  Administered Date(s) Administered  . Influenza Split 11/13/2012, 12/13/2012, 11/30/2013, 10/09/2014, 11/24/2018  . Influenza, High Dose Seasonal PF 12/01/2013, 10/04/2014, 10/12/2015, 11/06/2016, 11/12/2017  . Influenza,inj,Quad PF,6+ Mos 10/24/2010  . Influenza-Unspecified 10/24/2010, 11/01/2019, 12/04/2020  . Moderna Covid-19 Vaccine Bivalent  Booster 62yrs & up 07/24/2021  . Moderna SARS-COV2 Booster Vaccination 07/18/2020  . Moderna Sars-Covid-2 Vaccination 02/14/2019, 03/14/2019, 12/26/2019  . PFIZER(Purple Top)SARS-COV-2 Vaccination 10/31/2020  . PPD Test 11/27/2021  . [employer] officer, [employer] 20yrs & up 10/31/2020  . Pneumococcal Conjugate-13 09/28/2013  . Pneumococcal Polysaccharide-23 03/05/2012  . Td 10/16/2010  . Td (Adult) 08/27/2000  . Tdap 08/27/2000, 10/16/2010  . Zoster, Live 01/11/2013, 03/31/2020   Pertinent  Health Maintenance Due  Topic Date Due  . INFLUENZA VACCINE  09/11/2022  . DEXA SCAN  Completed      02/12/2022    4:30 PM 03/25/2022   11:07 AM 04/03/2022    3:37 PM 04/18/2022    9:19 AM 07/04/2022   10:17 AM  Fall Risk  Falls in the past year? 0 0 0 0 1  Was there an injury with Fall? 0 0 0 0 0  Fall Risk Category Calculator 0 0 0 0 2  Fall Risk Category (Retired) Low      (RETIRED) Patient Fall Risk Level High fall risk      Patient at Risk for Falls Due to History of fall(s);Impaired balance/gait;Impaired mobility;Mental status change History of fall(s);Impaired balance/gait;Impaired mobility;Mental status change History of fall(s);Impaired balance/gait;Impaired mobility;Mental status change History of fall(s);Impaired balance/gait;Impaired mobility;Mental status change History of fall(s);Impaired balance/gait;Impaired mobility;Mental status change  Fall risk Follow up Falls evaluation completed Falls evaluation completed Falls evaluation completed Falls evaluation completed Falls evaluation completed   Functional Status Survey:    Vitals:   10/24/22 1437  BP: (!) 158/74  Pulse: 82  Resp: 18  Temp: (!) 97.4 F (36.3 C)  SpO2: 96%   There is no height or weight on file to calculate BMI. Physical Exam Vitals and nursing note reviewed.  Constitutional:      Appearance: Normal appearance.  HENT:     Head: Normocephalic and atraumatic.     Mouth/Throat:     Mouth: Mucous  membranes are moist.  Eyes:     Extraocular Movements: Extraocular movements intact.     Conjunctiva/sclera: Conjunctivae normal.     Pupils: Pupils are equal, round, and reactive to light.  Cardiovascular:     Rate and Rhythm: Normal rate and regular rhythm.  Pulmonary:     Effort: Pulmonary effort is normal.     Breath sounds: Rales present.     Comments: Posterior left lower lung rales  Abdominal:     General: Bowel sounds are normal.     Palpations: Abdomen is soft.     Tenderness: There is no abdominal tenderness.     Hernia: A hernia is present.     Comments: Incision hernia  Musculoskeletal:     Cervical back: Normal range of motion and neck supple.     Right lower leg: No edema.     Left  lower leg: No edema.     Comments: Right ankle brace, chronic pain.   Skin:    General: Skin is warm and dry.     Findings: Erythema present.     Comments: Excoriated buttocks.   Neurological:     General: No focal deficit present.     Mental Status: She is alert. Mental status is at baseline.     Motor: No weakness.     Gait: Gait abnormal.     Comments: Oriented to person and her room on unit.   Psychiatric:        Mood and Affect: Mood normal.        Thought Content: Thought content normal.     Comments: Confused her locations.     Labs reviewed: Recent Labs    11/26/21 0420 01/22/22 0925 04/08/22 0755 08/31/22 1134 10/07/22 0000  NA 135 138 139 136  138 135*  K 3.7 4.5 4.3 3.9  4.3 4.1  CL 104 104 104 104  102 104  CO2 23 25 24* 24 24*  GLUCOSE 109* 125*  --  108*  105*  --   BUN 29* 21 24* 21  26* 15  CREATININE 1.23* 1.23* 1.2* 1.17*  1.30* 1.0  CALCIUM 8.7* 9.3 9.3 8.7* 8.1*   Recent Labs    11/24/21 1023 01/22/22 0925 08/31/22 1134 10/07/22 0000  AST 23 26 27 17   ALT 16 16 19 14   ALKPHOS 67 68 83 80  BILITOT 0.6 0.6 0.7  --   PROT 8.5* 7.1 7.2  --   ALBUMIN 4.2 3.5 3.4* 2.7*   Recent Labs    11/26/21 0420 01/22/22 0925 04/08/22 0755  08/31/22 1134 10/07/22 0000 10/09/22 0000  WBC 8.9 7.8   < > 9.0 7.3 8.5  NEUTROABS  --  6.3   < > 7.0 5,446.00 6,656.00  HGB 12.7 12.6   < > 12.7  13.6 9.6* 11.0*  HCT 40.1 40.6   < > 40.3  40.0 30* 34*  MCV 84.2 84.1  --  85.7  --   --   PLT 202 191   < > 227 315 324   < > = values in this interval not displayed.   Lab Results  Component Value Date   TSH 3.19 03/27/2022   Lab Results  Component Value Date   HGBA1C 5.7 (H) 11/24/2021   Lab Results  Component Value Date   CHOL 181 11/25/2021   HDL 37 (L) 11/25/2021   LDLCALC 127 (H) 11/25/2021   TRIG 85 11/25/2021   CHOLHDL 4.9 11/25/2021    Significant Diagnostic Results in last 30 days:  No results found.  Assessment/Plan: Candidiasis of skin R+L buttock irritated skin with superficial openings from moist, heat, pressure, with assist with patient with incontinent B+B care, apply Nystatin cream bid until healed.   Urinary frequency Incontinent of bladder, may consider dc Mirabegron in the future.   Osteoarthritis  chronic R ankle pain, uses ankle brace, mechanical lift for transfer,  w/c to get around.   Panic disorder Situational, prn Alprazolam  Essential hypertension Blood pressure is controlled, on Metoprolol, off Amlodipine, prn Clonidine  Slow transit constipation Stable, takes Colace. MiraLax qod  Senile dementia (HCC)  SNF [facility] for supportive care, 01/29/22 MMSE 23/30  Atrial flutter (HCC) f/u Cardiology 10/16/22, off anticoagulation 2/2 falls in the past, restarted Eliquis after acute CVA 11/24/21, heart rate is controlled on Metoprolol  Hyponatremia Na 133 10/21/22  Hypothyroidism takes Levothyroxine, TSH  3.19 03/27/22  GERD taking Protonix  CKD (chronic kidney disease) stage 3, GFR 30-59 ml/min (HCC) Bun/creat 15/0.8 10/21/22  Stroke (cerebrum) (HCC) 11/24/21 acute CVA left PCA embolic strokes, no apparent residual symptoms, on Eliquis f/u Neurology  CHF (congestive heart failure)  (HCC) , compensated clinically, BLE edema and cough improved, on Furosemide, [facility] CXR CHF mild left effusion. saw Cardiology 10/15/22, no change of medication, placed on Mg, pending BMP/Mg, scheduled echocardiogram  Hypomagnesemia 10/21/22 Mg 1.4, added Mg 400mg  every day, f/u Mg/BMP one week    Family/ staff Communication: plan of care reviewed with the patient and charge nurse.   Labs/tests ordered:  Mg/BMP one week  Time spend 35 minutes.

## 2022-10-24 NOTE — Assessment & Plan Note (Signed)
takes Levothyroxine, TSH 3.19 03/27/22

## 2022-10-24 NOTE — Assessment & Plan Note (Signed)
10/21/22 Mg 1.4, added Mg 400mg  every day, f/u Mg/BMP one week

## 2022-10-24 NOTE — Assessment & Plan Note (Signed)
,   compensated clinically, BLE edema and cough improved, on Furosemide, [facility] CXR CHF mild left effusion. saw Cardiology 10/15/22, no change of medication, placed on Mg, pending BMP/Mg, scheduled echocardiogram

## 2022-10-24 NOTE — Assessment & Plan Note (Signed)
Na 133 10/21/22

## 2022-10-24 NOTE — Assessment & Plan Note (Signed)
taking Protonix

## 2022-10-24 NOTE — Assessment & Plan Note (Signed)
Situational, prn Alprazolam

## 2022-10-24 NOTE — Assessment & Plan Note (Signed)
chronic R ankle pain, uses ankle brace, mechanical lift for transfer,  w/c to get around.

## 2022-10-24 NOTE — Assessment & Plan Note (Signed)
R+L buttock irritated skin with superficial openings from moist, heat, pressure, with assist with patient with incontinent B+B care, apply Nystatin cream bid until healed.

## 2022-10-24 NOTE — Assessment & Plan Note (Signed)
11/24/21 acute CVA left PCA embolic strokes, no apparent residual symptoms, on Eliquis f/u Neurology

## 2022-10-24 NOTE — Assessment & Plan Note (Signed)
SNF [facility] for supportive care, 01/29/22 MMSE 23/30

## 2022-10-24 NOTE — Assessment & Plan Note (Signed)
f/u Cardiology 10/16/22, off anticoagulation 2/2 falls in the past, restarted Eliquis after acute CVA 11/24/21, heart rate is controlled on Metoprolol

## 2022-10-28 ENCOUNTER — Other Ambulatory Visit: Payer: Self-pay

## 2022-10-28 ENCOUNTER — Encounter: Payer: Self-pay | Admitting: Nurse Practitioner

## 2022-10-28 ENCOUNTER — Observation Stay (HOSPITAL_COMMUNITY): Payer: Medicare Other

## 2022-10-28 ENCOUNTER — Non-Acute Institutional Stay (SKILLED_NURSING_FACILITY): Payer: Medicare Other | Admitting: Nurse Practitioner

## 2022-10-28 ENCOUNTER — Inpatient Hospital Stay (HOSPITAL_COMMUNITY)
Admission: EM | Admit: 2022-10-28 | Discharge: 2022-10-30 | DRG: 378 | Disposition: A | Discharge status: Skilled Nursing Facility | Payer: Medicare Other | Source: Skilled Nursing Facility | Attending: Internal Medicine | Admitting: Internal Medicine

## 2022-10-28 DIAGNOSIS — F0390 Unspecified dementia without behavioral disturbance: Secondary | ICD-10-CM

## 2022-10-28 DIAGNOSIS — I509 Heart failure, unspecified: Secondary | ICD-10-CM

## 2022-10-28 DIAGNOSIS — Z8052 Family history of malignant neoplasm of bladder: Secondary | ICD-10-CM

## 2022-10-28 DIAGNOSIS — Z6830 Body mass index (BMI) 30.0-30.9, adult: Secondary | ICD-10-CM

## 2022-10-28 DIAGNOSIS — K921 Melena: Principal | ICD-10-CM | POA: Diagnosis present

## 2022-10-28 DIAGNOSIS — Z7401 Bed confinement status: Secondary | ICD-10-CM

## 2022-10-28 DIAGNOSIS — B372 Candidiasis of skin and nail: Secondary | ICD-10-CM

## 2022-10-28 DIAGNOSIS — Z96651 Presence of right artificial knee joint: Secondary | ICD-10-CM | POA: Diagnosis present

## 2022-10-28 DIAGNOSIS — Z880 Allergy status to penicillin: Secondary | ICD-10-CM

## 2022-10-28 DIAGNOSIS — R627 Adult failure to thrive: Secondary | ICD-10-CM

## 2022-10-28 DIAGNOSIS — Z79890 Hormone replacement therapy (postmenopausal): Secondary | ICD-10-CM

## 2022-10-28 DIAGNOSIS — K625 Hemorrhage of anus and rectum: Secondary | ICD-10-CM | POA: Diagnosis not present

## 2022-10-28 DIAGNOSIS — Z8249 Family history of ischemic heart disease and other diseases of the circulatory system: Secondary | ICD-10-CM

## 2022-10-28 DIAGNOSIS — N1831 Chronic kidney disease, stage 3a: Secondary | ICD-10-CM

## 2022-10-28 DIAGNOSIS — Z7901 Long term (current) use of anticoagulants: Secondary | ICD-10-CM

## 2022-10-28 DIAGNOSIS — Z885 Allergy status to narcotic agent status: Secondary | ICD-10-CM

## 2022-10-28 DIAGNOSIS — E785 Hyperlipidemia, unspecified: Secondary | ICD-10-CM | POA: Diagnosis present

## 2022-10-28 DIAGNOSIS — Z8543 Personal history of malignant neoplasm of ovary: Secondary | ICD-10-CM

## 2022-10-28 DIAGNOSIS — R350 Frequency of micturition: Secondary | ICD-10-CM

## 2022-10-28 DIAGNOSIS — I4891 Unspecified atrial fibrillation: Secondary | ICD-10-CM | POA: Diagnosis present

## 2022-10-28 DIAGNOSIS — E039 Hypothyroidism, unspecified: Secondary | ICD-10-CM | POA: Diagnosis present

## 2022-10-28 DIAGNOSIS — I4892 Unspecified atrial flutter: Secondary | ICD-10-CM

## 2022-10-28 DIAGNOSIS — Z8616 Personal history of COVID-19: Secondary | ICD-10-CM

## 2022-10-28 DIAGNOSIS — R1310 Dysphagia, unspecified: Secondary | ICD-10-CM

## 2022-10-28 DIAGNOSIS — K219 Gastro-esophageal reflux disease without esophagitis: Secondary | ICD-10-CM | POA: Diagnosis present

## 2022-10-28 DIAGNOSIS — Z91013 Allergy to seafood: Secondary | ICD-10-CM

## 2022-10-28 DIAGNOSIS — N183 Chronic kidney disease, stage 3 unspecified: Secondary | ICD-10-CM | POA: Diagnosis present

## 2022-10-28 DIAGNOSIS — E871 Hypo-osmolality and hyponatremia: Secondary | ICD-10-CM | POA: Diagnosis present

## 2022-10-28 DIAGNOSIS — I639 Cerebral infarction, unspecified: Secondary | ICD-10-CM

## 2022-10-28 DIAGNOSIS — I130 Hypertensive heart and chronic kidney disease with heart failure and stage 1 through stage 4 chronic kidney disease, or unspecified chronic kidney disease: Secondary | ICD-10-CM | POA: Diagnosis present

## 2022-10-28 DIAGNOSIS — I10 Essential (primary) hypertension: Secondary | ICD-10-CM | POA: Diagnosis present

## 2022-10-28 DIAGNOSIS — C569 Malignant neoplasm of unspecified ovary: Secondary | ICD-10-CM | POA: Diagnosis present

## 2022-10-28 DIAGNOSIS — Z833 Family history of diabetes mellitus: Secondary | ICD-10-CM

## 2022-10-28 DIAGNOSIS — Z79899 Other long term (current) drug therapy: Secondary | ICD-10-CM

## 2022-10-28 DIAGNOSIS — Z8673 Personal history of transient ischemic attack (TIA), and cerebral infarction without residual deficits: Secondary | ICD-10-CM

## 2022-10-28 DIAGNOSIS — I5032 Chronic diastolic (congestive) heart failure: Secondary | ICD-10-CM | POA: Diagnosis present

## 2022-10-28 DIAGNOSIS — E669 Obesity, unspecified: Secondary | ICD-10-CM | POA: Diagnosis present

## 2022-10-28 DIAGNOSIS — M810 Age-related osteoporosis without current pathological fracture: Secondary | ICD-10-CM | POA: Diagnosis present

## 2022-10-28 DIAGNOSIS — D509 Iron deficiency anemia, unspecified: Secondary | ICD-10-CM

## 2022-10-28 DIAGNOSIS — K922 Gastrointestinal hemorrhage, unspecified: Secondary | ICD-10-CM | POA: Diagnosis not present

## 2022-10-28 DIAGNOSIS — Z87440 Personal history of urinary (tract) infections: Secondary | ICD-10-CM

## 2022-10-28 DIAGNOSIS — Z87891 Personal history of nicotine dependence: Secondary | ICD-10-CM

## 2022-10-28 DIAGNOSIS — Z515 Encounter for palliative care: Secondary | ICD-10-CM

## 2022-10-28 DIAGNOSIS — R9431 Abnormal electrocardiogram [ECG] [EKG]: Secondary | ICD-10-CM

## 2022-10-28 LAB — COMPREHENSIVE METABOLIC PANEL
ALT: 22 U/L (ref 0–44)
AST: 30 U/L (ref 15–41)
Albumin: 2.4 g/dL — ABNORMAL LOW (ref 3.5–5.0)
Alkaline Phosphatase: 79 U/L (ref 38–126)
Anion gap: 12 (ref 5–15)
BUN: 17 mg/dL (ref 8–23)
CO2: 25 mmol/L (ref 22–32)
Calcium: 8.7 mg/dL — ABNORMAL LOW (ref 8.9–10.3)
Chloride: 95 mmol/L — ABNORMAL LOW (ref 98–111)
Creatinine, Ser: 1.18 mg/dL — ABNORMAL HIGH (ref 0.44–1.00)
GFR, Estimated: 44 mL/min — ABNORMAL LOW (ref >60)
Glucose, Bld: 131 mg/dL — ABNORMAL HIGH (ref 70–99)
Potassium: 4.4 mmol/L (ref 3.5–5.1)
Sodium: 132 mmol/L — ABNORMAL LOW (ref 135–145)
Total Bilirubin: 0.5 mg/dL (ref 0.3–1.2)
Total Protein: 6.7 g/dL (ref 6.5–8.1)

## 2022-10-28 LAB — POC OCCULT BLOOD, ED: Fecal Occult Bld: POSITIVE — AB

## 2022-10-28 LAB — CBC WITH DIFFERENTIAL/PLATELET
Abs Immature Granulocytes: 0.09 10*3/uL — ABNORMAL HIGH (ref 0.00–0.07)
Basophils Absolute: 0.1 10*3/uL (ref 0.0–0.1)
Basophils Relative: 1 %
Eosinophils Absolute: 0.1 10*3/uL (ref 0.0–0.5)
Eosinophils Relative: 1 %
HCT: 33.3 % — ABNORMAL LOW (ref 36.0–46.0)
Hemoglobin: 10.7 g/dL — ABNORMAL LOW (ref 12.0–15.0)
Immature Granulocytes: 1 %
Lymphocytes Relative: 10 %
Lymphs Abs: 1 10*3/uL (ref 0.7–4.0)
MCH: 26.3 pg (ref 26.0–34.0)
MCHC: 32.1 g/dL (ref 30.0–36.0)
MCV: 81.8 fL (ref 80.0–100.0)
Monocytes Absolute: 1.1 10*3/uL — ABNORMAL HIGH (ref 0.1–1.0)
Monocytes Relative: 11 %
Neutro Abs: 7.7 10*3/uL (ref 1.7–7.7)
Neutrophils Relative %: 76 %
Platelets: 434 10*3/uL — ABNORMAL HIGH (ref 150–400)
RBC: 4.07 MIL/uL (ref 3.87–5.11)
RDW: 16 % — ABNORMAL HIGH (ref 11.5–15.5)
WBC: 10 10*3/uL (ref 4.0–10.5)
nRBC: 0 % (ref 0.0–0.2)

## 2022-10-28 LAB — TYPE AND SCREEN
ABO/RH(D): A POS
Antibody Screen: NEGATIVE

## 2022-10-28 LAB — TSH: TSH: 4.376 u[IU]/mL (ref 0.350–4.500)

## 2022-10-28 LAB — MAGNESIUM: Magnesium: 1.7 mg/dL (ref 1.7–2.4)

## 2022-10-28 LAB — PROTIME-INR
INR: 1.9 — ABNORMAL HIGH (ref 0.8–1.2)
Prothrombin Time: 22.4 s — ABNORMAL HIGH (ref 11.4–15.2)

## 2022-10-28 MED ORDER — ENSURE ENLIVE PO LIQD: 237.0000 mL | Freq: Two times a day (BID) | ORAL | Status: DC

## 2022-10-28 MED ORDER — SODIUM CHLORIDE 1 G PO TABS
1.0000 g | ORAL_TABLET | Freq: Two times a day (BID) | ORAL | Status: DC
  Administered 2022-10-28 – 2022-10-30 (×4): 1 g via ORAL
  Filled 2022-10-28 (×4): qty 1

## 2022-10-28 MED ORDER — SODIUM CHLORIDE 0.9% FLUSH
3.0000 mL | Freq: Two times a day (BID) | INTRAVENOUS | Status: DC
  Administered 2022-10-28 – 2022-10-30 (×4): 3 mL via INTRAVENOUS

## 2022-10-28 MED ORDER — FUROSEMIDE 20 MG PO TABS
10.0000 mg | ORAL_TABLET | Freq: Every day | ORAL | Status: DC
  Administered 2022-10-29 – 2022-10-30 (×2): 10 mg via ORAL
  Filled 2022-10-28 (×2): qty 1

## 2022-10-28 MED ORDER — METOPROLOL TARTRATE 25 MG PO TABS
25.0000 mg | ORAL_TABLET | Freq: Two times a day (BID) | ORAL | Status: DC
  Administered 2022-10-28 – 2022-10-30 (×4): 25 mg via ORAL
  Filled 2022-10-28 (×4): qty 1

## 2022-10-28 MED ORDER — LATANOPROST 0.005 % OP SOLN
1.0000 [drp] | Freq: Every day | OPHTHALMIC | Status: DC
  Administered 2022-10-29 (×2): 1 [drp] via OPHTHALMIC
  Filled 2022-10-28 (×2): qty 2.5

## 2022-10-28 MED ORDER — POLYETHYLENE GLYCOL 3350 17 G PO PACK
17.0000 g | PACK | ORAL | Status: DC
  Administered 2022-10-30: 17 g via ORAL
  Filled 2022-10-28: qty 1

## 2022-10-28 MED ORDER — SODIUM CHLORIDE 0.9 % IV SOLN: 250.0000 mL | INTRAVENOUS | Status: DC | PRN

## 2022-10-28 MED ORDER — DOCUSATE SODIUM 100 MG PO CAPS
100.0000 mg | ORAL_CAPSULE | Freq: Every day | ORAL | Status: DC
  Administered 2022-10-29 – 2022-10-30 (×2): 100 mg via ORAL
  Filled 2022-10-28 (×2): qty 1

## 2022-10-28 MED ORDER — POLYVINYL ALCOHOL 1.4 % OP SOLN: 1.0000 [drp] | OPHTHALMIC | Status: DC | PRN

## 2022-10-28 MED ORDER — POTASSIUM CHLORIDE ER 10 MEQ PO TBCR
10.0000 meq | EXTENDED_RELEASE_TABLET | Freq: Every day | ORAL | Status: DC
  Administered 2022-10-29 – 2022-10-30 (×2): 10 meq via ORAL
  Filled 2022-10-28 (×4): qty 1

## 2022-10-28 MED ORDER — ATORVASTATIN CALCIUM 40 MG PO TABS
40.0000 mg | ORAL_TABLET | Freq: Every day | ORAL | Status: DC
  Administered 2022-10-29 – 2022-10-30 (×2): 40 mg via ORAL
  Filled 2022-10-28 (×2): qty 1

## 2022-10-28 MED ORDER — SODIUM CHLORIDE 0.9% FLUSH: 3.0000 mL | INTRAVENOUS | Status: DC | PRN

## 2022-10-28 MED ORDER — PANTOPRAZOLE SODIUM 40 MG PO TBEC
40.0000 mg | DELAYED_RELEASE_TABLET | Freq: Every day | ORAL | Status: DC
  Administered 2022-10-29 – 2022-10-30 (×2): 40 mg via ORAL
  Filled 2022-10-28 (×2): qty 1

## 2022-10-28 MED ORDER — MAGNESIUM OXIDE 400 MG PO TABS
400.0000 mg | ORAL_TABLET | Freq: Every day | ORAL | Status: DC
  Administered 2022-10-28: 400 mg via ORAL
  Filled 2022-10-28 (×3): qty 1

## 2022-10-28 MED ORDER — FERROUS SULFATE 325 (65 FE) MG PO TABS
325.0000 mg | ORAL_TABLET | ORAL | Status: DC
  Administered 2022-10-30: 325 mg via ORAL
  Filled 2022-10-28: qty 1

## 2022-10-28 MED ORDER — LEVOTHYROXINE SODIUM 25 MCG PO TABS
25.0000 ug | ORAL_TABLET | Freq: Every day | ORAL | Status: DC
  Administered 2022-10-29 – 2022-10-30 (×2): 25 ug via ORAL
  Filled 2022-10-28 (×2): qty 1

## 2022-10-28 MED ORDER — ACETAMINOPHEN 325 MG PO TABS
650.0000 mg | ORAL_TABLET | Freq: Four times a day (QID) | ORAL | Status: DC | PRN
  Administered 2022-10-28: 650 mg via ORAL
  Filled 2022-10-28: qty 2

## 2022-10-28 MED ORDER — ACETAMINOPHEN 650 MG RE SUPP: 650.0000 mg | Freq: Four times a day (QID) | RECTAL | Status: DC | PRN

## 2022-10-28 NOTE — ED Notes (Signed)
ED TO INPATIENT HANDOFF REPORT  ED Nurse Name and Phone #: [phone]  S Name/Age/Gender [name] 89 y.o. female Room/Bed: 040C/040C  Code Status   Code Status: Full Code  Home/SNF/Other Skilled nursing facility Patient oriented to: self Is this baseline? Yes   Triage Complete: Triage complete  Chief Complaint Lower GI bleed [K92.2]  Triage Note PT BIB [facility] facility reports pt had stool with bright red in it. Pt denies any bleeding.  Pt is on blood thinners.  EMS VS 126/52 82 HR 97% RA  CBG 135    Allergies Allergies  Allergen Reactions   Morphine And Codeine Anaphylaxis and Other (See Comments)    Given after knee replacement and code blue occurred   Morphine Sulfate Anaphylaxis   Penicillins Hives, Itching and Other (See Comments)    Syncope, also   Nsaids Other (See Comments)    Because of an abnormal kidney test result   Penicillin V Hives   Shellfish Allergy Nausea And Vomiting and Other (See Comments)    Pt has shellfish allergy only.  Has had IV contrast x 2 and did fine.   Shellfish-Derived Products Nausea And Vomiting    Level of Care/Admitting Diagnosis ED Disposition     ED Disposition  Admit   Condition  --   Comment  Hospital Area: [facility] [[id number]]  Level of Care: Progressive [102]  Admit to Progressive based on following criteria: GI, ENDOCRINE disease patients with GI bleeding, acute liver failure or pancreatitis, stable with diabetic ketoacidosis or thyrotoxicosis (hypothyroid) state.  May place patient in observation at [facility] or [facility] if equivalent level of care is available:: No  Covid Evaluation: Asymptomatic - no recent exposure (last 10 days) testing not required  Diagnosis: Lower GI bleed [657846]  Admitting Physician: Orland Mustard [[id number]]  Attending Physician: Orland Mustard [[id number]]          B Medical/Surgery History Past Medical History:  Diagnosis Date   Anemia associated  with acute blood loss 04/03/2011   Arthritis    knees    Ascites    Blood transfusion    hx of 2011    Complication of anesthesia    Sodium drops per pt    Cystitis    DIARRHEA, ANTIBIOTIC ASSOCIATED 05/31/2009   Qualifier: Diagnosis of  By: Daiva Eves MD, Cornelius     GERD (gastroesophageal reflux disease)    H/O hiatal hernia    Hyperlipidemia    Hypertension    Hyponatremia    Neutropenia with fever (HCC) 05/18/2011   OSTEOMYELITIS, CHRONIC, LOWER LEG 04/23/2009   Qualifier: Diagnosis of  By: Ninetta Lights MD, Linus Mako    OSTEOPOROSIS 04/23/2009   Qualifier: Diagnosis of  By: Ninetta Lights MD, Jeffrey     Ovarian cancer (HCC) 01/25/2011   Pneumonia    hx of    Postmenopausal atrophic vaginitis    PROSTHETIC JOINT COMPLICATION 05/28/2009   Qualifier: Diagnosis of  By: Daiva Eves MD, Cornelius     Renal disorder    Decreased kidney function   Staph infection 2010   after knee replacement   Urinary frequency    Vulvitis    Past Surgical History:  Procedure Laterality Date   ABDOMINAL HYSTERECTOMY  04/01/2011   Procedure: HYSTERECTOMY ABDOMINAL;  Surgeon: Jeannette Corpus, MD;  Location: [facility];  Service: Gynecology;  Laterality: N/A;   APPENDECTOMY     JOINT REPLACEMENT     R knee in 2008,  5 operations on L knee   LAPAROTOMY  04/01/2011   Procedure: EXPLORATORY LAPAROTOMY;  Surgeon: Jeannette Corpus, MD;  Location: [facility];  Service: Gynecology;  Laterality: N/A;   OTHER SURGICAL HISTORY     hx of C section 1966   SALPINGOOPHORECTOMY  04/01/2011   Procedure: SALPINGO OOPHERECTOMY;  Surgeon: Jeannette Corpus, MD;  Location: [facility];  Service: Gynecology;  Laterality: Bilateral;   TONSILLECTOMY       A IV Location/Drains/Wounds Patient Lines/Drains/Airways Status     Active Line/Drains/Airways     Name Placement date Placement time Site Days   Peripheral IV 10/28/22 20 G Anterior;Proximal;Right Forearm 10/28/22  1314  Forearm  less than 1             Intake/Output Last 24 hours  Intake/Output Summary (Last 24 hours) at 10/28/2022 2142 Last data filed at 10/28/2022 1617 Gross per 24 hour  Intake 3 ml  Output --  Net 3 ml    Labs/Imaging Results for orders placed or performed during the hospital encounter of 10/28/22 (from the past 48 hour(s))  Comprehensive metabolic panel     Status: Abnormal   Collection Time: 10/28/22 12:27 PM  Result Value Ref Range   Sodium 132 (L) 135 - 145 mmol/L   Potassium 4.4 3.5 - 5.1 mmol/L   Chloride 95 (L) 98 - 111 mmol/L   CO2 25 22 - 32 mmol/L   Glucose, Bld 131 (H) 70 - 99 mg/dL    Comment: Glucose reference range applies only to samples taken after fasting for at least 8 hours.   BUN 17 8 - 23 mg/dL   Creatinine, Ser 7.78 (H) 0.44 - 1.00 mg/dL   Calcium 8.7 (L) 8.9 - 10.3 mg/dL   Total Protein 6.7 6.5 - 8.1 g/dL   Albumin 2.4 (L) 3.5 - 5.0 g/dL   AST 30 15 - 41 U/L   ALT 22 0 - 44 U/L   Alkaline Phosphatase 79 38 - 126 U/L   Total Bilirubin 0.5 0.3 - 1.2 mg/dL   GFR, Estimated 44 (L) >60 mL/min    Comment: (NOTE) Calculated using the CKD-EPI Creatinine Equation (2021)    Anion gap 12 5 - 15    Comment: Performed at [facility], [address] [place], [place] [address]  CBC with Differential     Status: Abnormal   Collection Time: 10/28/22 12:27 PM  Result Value Ref Range   WBC 10.0 4.0 - 10.5 K/uL   RBC 4.07 3.87 - 5.11 MIL/uL   Hemoglobin 10.7 (L) 12.0 - 15.0 g/dL   HCT 36.1 (L) 44.3 - 15.4 %   MCV 81.8 80.0 - 100.0 fL   MCH 26.3 26.0 - 34.0 pg   MCHC 32.1 30.0 - 36.0 g/dL   RDW 00.8 (H) 67.6 - 19.5 %   Platelets 434 (H) 150 - 400 K/uL   nRBC 0.0 0.0 - 0.2 %   Neutrophils Relative % 76 %   Neutro Abs 7.7 1.7 - 7.7 K/uL   Lymphocytes Relative 10 %   Lymphs Abs 1.0 0.7 - 4.0 K/uL   Monocytes Relative 11 %   Monocytes Absolute 1.1 (H) 0.1 - 1.0 K/uL   Eosinophils Relative 1 %   Eosinophils Absolute 0.1 0.0 - 0.5 K/uL   Basophils Relative 1 %   Basophils  Absolute 0.1 0.0 - 0.1 K/uL   Immature Granulocytes 1 %   Abs Immature Granulocytes 0.09 (H) 0.00 - 0.07 K/uL  Comment: Performed at [facility], [address], [place], [place] [address]  Protime-INR     Status: Abnormal   Collection Time: 10/28/22 12:27 PM  Result Value Ref Range   Prothrombin Time 22.4 (H) 11.4 - 15.2 seconds   INR 1.9 (H) 0.8 - 1.2    Comment: (NOTE) INR goal varies based on device and disease states. Performed at [facility], [address], [place], [place] [address]   Magnesium     Status: None   Collection Time: 10/28/22 12:27 PM  Result Value Ref Range   Magnesium 1.7 1.7 - 2.4 mg/dL    Comment: Performed at [facility], [address] [place], [place] [address]  TSH     Status: None   Collection Time: 10/28/22 12:27 PM  Result Value Ref Range   TSH 4.376 0.350 - 4.500 uIU/mL    Comment: Performed by a 3rd Generation assay with a functional sensitivity of <=0.01 uIU/mL. Performed at [facility], [address] [place], [place] [address]   POC occult blood, ED Provider will collect     Status: Abnormal   Collection Time: 10/28/22 12:45 PM  Result Value Ref Range   Fecal Occult Bld POSITIVE (A) NEGATIVE  Type and screen [facility]     Status: None   Collection Time: 10/28/22  1:06 PM  Result Value Ref Range   ABO/RH(D) A POS    Antibody Screen NEG    Sample Expiration      10/31/2022,2359 Performed at [facility], [address] [place], [place] [address]    DG CHEST PORT 1 VIEW  Result Date: 10/28/2022 CLINICAL DATA:  Acute exacerbation of CHF.  Shortness of breath. EXAM: PORTABLE CHEST 1 VIEW COMPARISON:  01/22/2022 FINDINGS: Lung volumes are low. Grossly stable heart size, left heart border partially obscured by pleural effusion and opacity. Aortic atherosclerosis. Opacification of the lower left hemithorax likely related to tracking pleural effusion and atelectasis. Slight vascular  congestion. No pneumothorax. IMPRESSION: 1. Low lung volumes. Opacification of the lower left hemithorax likely related to tracking pleural effusion and atelectasis. 2. Slight vascular congestion. Electronically Signed   By: Narda Rutherford M.D.   On: 10/28/2022 16:34    Pending Labs Unresulted Labs (From admission, onward)     Start     Ordered   10/29/22 0500  Basic metabolic panel  Tomorrow morning,   R        10/28/22 1536   10/28/22 2000  CBC  Once,   STAT        10/28/22 2000   10/28/22 1428  CBC  Now then every 6 hours,   R (with STAT occurrences)      10/28/22 1427            Vitals/Pain Today's Vitals   10/28/22 1945 10/28/22 2000 10/28/22 2040 10/28/22 2121  BP: (!) 123/92  (!) 130/52 (!) 125/53  Pulse:   86 88  Resp: (!) 22 (!) 26 20   Temp:   97.6 F (36.4 C)   TempSrc:   Oral   SpO2:   97%   Weight:      Height:      PainSc:        Isolation Precautions No active isolations  Medications Medications  polyvinyl alcohol (LIQUIFILM TEARS) 1.4 % ophthalmic solution 1 drop (has no administration in time range)  feeding supplement (ENSURE ENLIVE / ENSURE PLUS) liquid  237 mL (has no administration in time range)  sodium chloride flush (NS) 0.9 % injection 3 mL (3 mLs Intravenous Given 10/28/22 1617)  sodium chloride flush (NS) 0.9 % injection 3 mL (has no administration in time range)  0.9 %  sodium chloride infusion (has no administration in time range)  acetaminophen (TYLENOL) tablet 650 mg (650 mg Oral Given 10/28/22 2121)    Or  acetaminophen (TYLENOL) suppository 650 mg ( Rectal See Alternative 10/28/22 2121)  docusate sodium (COLACE) capsule 100 mg (has no administration in time range)  magnesium oxide (MAG-OX) tablet 400 mg (400 mg Oral Given 10/28/22 2121)  potassium chloride (KLOR-CON) CR tablet 10 mEq (has no administration in time range)  ferrous sulfate tablet 325 mg (has no administration in time range)  furosemide (LASIX) tablet 10 mg (has no  administration in time range)  levothyroxine (SYNTHROID) tablet 25 mcg (has no administration in time range)  metoprolol tartrate (LOPRESSOR) tablet 25 mg (25 mg Oral Given 10/28/22 2121)  atorvastatin (LIPITOR) tablet 40 mg (has no administration in time range)  pantoprazole (PROTONIX) EC tablet 40 mg (has no administration in time range)  sodium chloride tablet 1 g (1 g Oral Given 10/28/22 2129)  latanoprost (XALATAN) 0.005 % ophthalmic solution 1 drop (has no administration in time range)  polyethylene glycol (MIRALAX / GLYCOLAX) packet 17 g (has no administration in time range)    Mobility Patient ambulation has not been assessed.          Focused Assessments GI Bleed   R Recommendations: See Admitting Provider Note  Report given to:   Additional Notes:  Crushed med with apple sauce, patient took well. HX dementia. Incontinent.

## 2022-10-28 NOTE — Assessment & Plan Note (Signed)
Optimize electrolytes Keep on telemetry Avoid qt prolonging drugs  Repeat ekg in AM

## 2022-10-28 NOTE — Assessment & Plan Note (Signed)
Baseline creatinine of 1.2 Stable, continue to monitor

## 2022-10-28 NOTE — Assessment & Plan Note (Signed)
Blood pressure is controlled,  on Metoprolol, off Amlodipine, prn Clonidine

## 2022-10-28 NOTE — Assessment & Plan Note (Signed)
takes Levothyroxine, TSH 3.19 03/27/22

## 2022-10-28 NOTE — Assessment & Plan Note (Signed)
Na 133 10/21/22

## 2022-10-28 NOTE — Assessment & Plan Note (Signed)
11/24/21 acute CVA left PCA embolic strokes, no apparent residual symptoms, on Eliquis f/u Neurology

## 2022-10-28 NOTE — Assessment & Plan Note (Addendum)
Delirium precautions Fall precautions

## 2022-10-28 NOTE — Assessment & Plan Note (Signed)
Per SNF she has been coughing after taking pills They have started to crush pills  Will have SLP see  Aspiration precautions

## 2022-10-28 NOTE — Assessment & Plan Note (Signed)
Continue PPI ?

## 2022-10-28 NOTE — Assessment & Plan Note (Addendum)
blood in stool, no nausea, vomiting, constipation, diarrhea, abd pain noted, afebrile. On eliquis for CVA, Pantoprazole for GERD/GI protection.  HPOA agreed to ED eval rather than hold Eliquis, stat labs @ SNF [facility] he resides.

## 2022-10-28 NOTE — Progress Notes (Unsigned)
Location:  [facility] Room Number: N043-A Place of Service:  SNF (31) Provider:  Miakoda Mcmillion X Laynie Espy  Avan Gullett X, NP  Patient Care Team: Dawsyn Ramsaran X, NP as PCP - General (Internal Medicine) [name], MD as PCP - Cardiology (Cardiology) Olivia Mackie, MD as Consulting Physician (Obstetrics and Gynecology) De Blanch, MD as Consulting Physician (Gynecology) Cleda Mccreedy, MD as Consulting Physician (Gynecologic Oncology)  Extended Emergency Contact Information Primary Emergency Contact: [facility] Address: [address]          [place], [place] [address] D[place] of [place] Home Phone: [phone] Mobile Phone: [phone] Relation: Daughter  Code Status:  Full Code Goals of care: Advanced Directive information    10/17/2022    9:03 AM  Advanced Directives  Does Patient Have a Medical Advance Directive? Yes  Type of Advance Directive Living will;Healthcare Power of Attorney  Does patient want to make changes to medical advance directive? No - Patient declined  Copy of Healthcare Power of Attorney in Chart? Yes - validated most recent copy scanned in chart (See row information)     Chief Complaint  Patient presents with   Acute Visit    Blood in stool    HPI:  Pt is a 89 y.o. female seen today for an acute visit for    Past Medical History:  Diagnosis Date   Anemia associated with acute blood loss 04/03/2011   Arthritis    knees    Ascites    Blood transfusion    hx of 2011    Complication of anesthesia    Sodium drops per pt    Cystitis    DIARRHEA, ANTIBIOTIC ASSOCIATED 05/31/2009   Qualifier: Diagnosis of  By: Daiva Eves MD, Cornelius     GERD (gastroesophageal reflux disease)    H/O hiatal hernia    Hyperlipidemia    Hypertension    Hyponatremia    Neutropenia with fever (HCC) 05/18/2011   OSTEOMYELITIS, CHRONIC, LOWER LEG 04/23/2009   Qualifier: Diagnosis of  By: Ninetta Lights MD, Linus Mako     OSTEOPOROSIS 04/23/2009   Qualifier: Diagnosis of  By: Ninetta Lights MD, Jeffrey     Ovarian cancer (HCC) 01/25/2011   Pneumonia    hx of    Postmenopausal atrophic vaginitis    PROSTHETIC JOINT COMPLICATION 05/28/2009   Qualifier: Diagnosis of  By: Daiva Eves MD, Cornelius     Renal disorder    Decreased kidney function   Staph infection 2010   after knee replacement   Urinary frequency    Vulvitis    Past Surgical History:  Procedure Laterality Date   ABDOMINAL HYSTERECTOMY  04/01/2011   Procedure: HYSTERECTOMY ABDOMINAL;  Surgeon: Jeannette Corpus, MD;  Location: [facility];  Service: Gynecology;  Laterality: N/A;   APPENDECTOMY     JOINT REPLACEMENT     R knee in 2008, 5 operations on L knee   LAPAROTOMY  04/01/2011   Procedure: EXPLORATORY LAPAROTOMY;  Surgeon: Jeannette Corpus, MD;  Location: [facility];  Service: Gynecology;  Laterality: N/A;   OTHER SURGICAL HISTORY     hx of C section 1966   SALPINGOOPHORECTOMY  04/01/2011   Procedure: SALPINGO OOPHERECTOMY;  Surgeon: Jeannette Corpus, MD;  Location: [facility];  Service: Gynecology;  Laterality: Bilateral;   TONSILLECTOMY      Allergies  Allergen Reactions   Morphine And Codeine Anaphylaxis and Other (See Comments)    Given after knee replacement and code  blue occurred   Morphine Sulfate Anaphylaxis   Penicillins Hives, Itching and Other (See Comments)    Syncope, also   Nsaids Other (See Comments)    Because of an abnormal kidney test result   Penicillin V Hives   Shellfish Allergy Nausea And Vomiting and Other (See Comments)    Pt has shellfish allergy only.  Has had IV contrast x 2 and did fine.   Shellfish-Derived Products Nausea And Vomiting    Outpatient Encounter Medications as of 10/28/2022  Medication Sig   acetaminophen (TYLENOL) 325 MG tablet Take 650 mg by mouth every 4 (four) hours as needed for moderate pain or fever.   atorvastatin (LIPITOR) 40 MG tablet Take 1 tablet (40 mg total) by mouth daily.    B Complex-C-Folic Acid (B-COMPLEX/FOLIC ACID/VITAMIN C PO) Take 1 tablet by mouth daily with breakfast.   bimatoprost (LUMIGAN) 0.03 % ophthalmic solution Place 1 drop into both eyes at bedtime.   Cholecalciferol (VITAMIN D3) 50 MCG (2000 UT) TABS Take 2,000 Units by mouth in the morning.   cloNIDine (CATAPRES) 0.1 MG tablet Take 0.1 mg by mouth daily as needed (hypertension).   docusate sodium (COLACE) 100 MG capsule Take 100 mg by mouth daily.   ferrous sulfate 325 (65 FE) MG EC tablet Take 325 mg by mouth 2 (two) times a week. Mon, Thrusday   furosemide (LASIX) 20 MG tablet Take 10 mg by mouth daily.   levothyroxine (SYNTHROID) 25 MCG tablet Take 25 mcg by mouth daily before breakfast.   Magnesium Oxide 400 MG CAPS Take 400 mg daily   metoprolol tartrate (LOPRESSOR) 25 MG tablet Take 25 mg by mouth 2 (two) times daily.   mirabegron ER (MYRBETRIQ) 25 MG TB24 tablet Take 25 mg by mouth daily.   nystatin (MYCOSTATIN/NYSTOP) powder Apply 1 application  topically 2 (two) times daily as needed (reason not listed).   OXYGEN Inhale 2 L into the lungs as needed (if spo2 < 90 %).   pantoprazole (PROTONIX) 40 MG tablet Take 40 mg by mouth daily.   polyethylene glycol (MIRALAX / GLYCOLAX) 17 g packet Take 17 g by mouth every other day.   polyvinyl alcohol (LIQUIFILM TEARS) 1.4 % ophthalmic solution Place 2 drops into both eyes in the morning, at noon, in the evening, and at bedtime.   potassium chloride (KLOR-CON) 10 MEQ tablet Take 10 mEq by mouth daily.   sodium chloride 1 g tablet Take 1 g by mouth 2 (two) times daily.   ALPRAZolam (XANAX) 0.5 MG tablet Take 1 tablet (0.5 mg total) by mouth every 12 (twelve) hours as needed for anxiety.   apixaban (ELIQUIS) 5 MG TABS tablet Take 1 tablet (5 mg total) by mouth 2 (two) times daily.   lactose free nutrition (BOOST) LIQD Take 237 mLs by mouth 3 (three) times daily between meals.   No facility-administered encounter medications on file as of 10/28/2022.     Review of Systems  Immunization History  Administered Date(s) Administered   Influenza Split 11/13/2012, 12/13/2012, 11/30/2013, 10/09/2014, 11/24/2018   Influenza, High Dose Seasonal PF 12/01/2013, 10/04/2014, 10/12/2015, 11/06/2016, 11/12/2017   Influenza,inj,Quad PF,6+ Mos 10/24/2010   Influenza-Unspecified 10/24/2010, 11/01/2019, 12/04/2020   Moderna Covid-19 Vaccine Bivalent Booster 53yrs & up 07/24/2021   Moderna SARS-COV2 Booster Vaccination 07/18/2020   Moderna Sars-Covid-2 Vaccination 02/14/2019, 03/14/2019, 12/26/2019   PFIZER(Purple Top)SARS-COV-2 Vaccination 10/31/2020   PPD Test 11/27/2021   Pfizer Covid-19 Vaccine Bivalent Booster 42yrs & up 10/31/2020   Pneumococcal Conjugate-13 09/28/2013  Pneumococcal Polysaccharide-23 03/05/2012   Td 10/16/2010   Td (Adult) 08/27/2000   Tdap 08/27/2000, 10/16/2010   Zoster, Live 01/11/2013, 03/31/2020   Pertinent  Health Maintenance Due  Topic Date Due   INFLUENZA VACCINE  09/11/2022   DEXA SCAN  Completed      02/12/2022    4:30 PM 03/25/2022   11:07 AM 04/03/2022    3:37 PM 04/18/2022    9:19 AM 07/04/2022   10:17 AM  Fall Risk  Falls in the past year? 0 0 0 0 1  Was there an injury with Fall? 0 0 0 0 0  Fall Risk Category Calculator 0 0 0 0 2  Fall Risk Category (Retired) Low      (RETIRED) Patient Fall Risk Level High fall risk      Patient at Risk for Falls Due to History of fall(s);Impaired balance/gait;Impaired mobility;Mental status change History of fall(s);Impaired balance/gait;Impaired mobility;Mental status change History of fall(s);Impaired balance/gait;Impaired mobility;Mental status change History of fall(s);Impaired balance/gait;Impaired mobility;Mental status change History of fall(s);Impaired balance/gait;Impaired mobility;Mental status change  Fall risk Follow up Falls evaluation completed Falls evaluation completed Falls evaluation completed Falls evaluation completed Falls evaluation completed    Functional Status Survey:    Vitals:   10/28/22 1054  BP: 112/69  Pulse: 91  Resp: 18  Temp: 97.8 F (36.6 C)  SpO2: 96%  Weight: 177 lb 9.6 oz (80.6 kg)  Height: 5\' 4"  (1.626 m)   Body mass index is 30.48 kg/m. Physical Exam  Labs reviewed: Recent Labs    11/26/21 0420 01/22/22 0925 04/08/22 0755 08/31/22 1134 10/07/22 0000  NA 135 138 139 136  138 135*  K 3.7 4.5 4.3 3.9  4.3 4.1  CL 104 104 104 104  102 104  CO2 23 25 24* 24 24*  GLUCOSE 109* 125*  --  108*  105*  --   BUN 29* 21 24* 21  26* 15  CREATININE 1.23* 1.23* 1.2* 1.17*  1.30* 1.0  CALCIUM 8.7* 9.3 9.3 8.7* 8.1*   Recent Labs    11/24/21 1023 01/22/22 0925 08/31/22 1134 10/07/22 0000  AST 23 26 27 17   ALT 16 16 19 14   ALKPHOS 67 68 83 80  BILITOT 0.6 0.6 0.7  --   PROT 8.5* 7.1 7.2  --   ALBUMIN 4.2 3.5 3.4* 2.7*   Recent Labs    11/26/21 0420 01/22/22 0925 04/08/22 0755 08/31/22 1134 10/07/22 0000 10/09/22 0000  WBC 8.9 7.8   < > 9.0 7.3 8.5  NEUTROABS  --  6.3   < > 7.0 5,446.00 6,656.00  HGB 12.7 12.6   < > 12.7  13.6 9.6* 11.0*  HCT 40.1 40.6   < > 40.3  40.0 30* 34*  MCV 84.2 84.1  --  85.7  --   --   PLT 202 191   < > 227 315 324   < > = values in this interval not displayed.   Lab Results  Component Value Date   TSH 3.19 03/27/2022   Lab Results  Component Value Date   HGBA1C 5.7 (H) 11/24/2021   Lab Results  Component Value Date   CHOL 181 11/25/2021   HDL 37 (L) 11/25/2021   LDLCALC 127 (H) 11/25/2021   TRIG 85 11/25/2021   CHOLHDL 4.9 11/25/2021    Significant Diagnostic Results in last 30 days:  No results found.  Assessment/Plan There are no diagnoses linked to this encounter.   Family/ staff Communication: ***  Labs/tests ordered:  ***

## 2022-10-28 NOTE — Assessment & Plan Note (Signed)
Bun/creat 15/0.8 10/21/22

## 2022-10-28 NOTE — Assessment & Plan Note (Signed)
Sodium at 132, at baseline

## 2022-10-28 NOTE — ED Triage Notes (Signed)
PT BIB [facility] facility reports pt had stool with bright red in it. Pt denies any bleeding.  Pt is on blood thinners.  EMS VS 126/52 82 HR 97% RA  CBG 135

## 2022-10-28 NOTE — Assessment & Plan Note (Signed)
Continue lipitor  ?

## 2022-10-28 NOTE — Assessment & Plan Note (Signed)
Hgb 9.6 10/07/22(12.9 07/17/22)<< Hgb 11.0 Iron 27, Vit B12 1179, Folate 6.6 10/09/22, start Fe 325mg  2x/wk, +FOBT x2/3 after Iron started.

## 2022-10-28 NOTE — ED Provider Notes (Signed)
[place] EMERGENCY DEPARTMENT AT [facility] Provider Note  CSN: [id number] Arrival date & time: 10/28/22 1213  Chief Complaint(s) No chief complaint on file.  HPI [name] is a 89 y.o. female with history of dementia, CHF, prior stroke, obesity, hypertension, hyperlipidemia presenting with blood in stool.  Patient presents from facility where apparently noted that she had some bright red blood streaks in her stool.  She is on blood thinner.  Patient is unable to really provide any meaningful history.  I asked the patient why she is here and she reports "my daughter brought me here, I think it is stupid".  Patient's daughter did not bring her to the emergency department and she is unable to explain why anyone brought her here.  History limited due to dementia.   Past Medical History Past Medical History:  Diagnosis Date   Anemia associated with acute blood loss 04/03/2011   Arthritis    knees    Ascites    Blood transfusion    hx of 2011    Complication of anesthesia    Sodium drops per pt    Cystitis    DIARRHEA, ANTIBIOTIC ASSOCIATED 05/31/2009   Qualifier: Diagnosis of  By: Daiva Eves MD, Cornelius     GERD (gastroesophageal reflux disease)    H/O hiatal hernia    Hyperlipidemia    Hypertension    Hyponatremia    Neutropenia with fever (HCC) 05/18/2011   OSTEOMYELITIS, CHRONIC, LOWER LEG 04/23/2009   Qualifier: Diagnosis of  By: Ninetta Lights MD, Linus Mako    OSTEOPOROSIS 04/23/2009   Qualifier: Diagnosis of  By: Ninetta Lights MD, Jeffrey     Ovarian cancer (HCC) 01/25/2011   Pneumonia    hx of    Postmenopausal atrophic vaginitis    PROSTHETIC JOINT COMPLICATION 05/28/2009   Qualifier: Diagnosis of  By: Daiva Eves MD, Cornelius     Renal disorder    Decreased kidney function   Staph infection 2010   after knee replacement   Urinary frequency    Vulvitis    Patient Active Problem List   Diagnosis Date Noted   History of CVA (cerebrovascular accident)  10/28/2022   Lower GI bleed 10/28/2022   Hypomagnesemia 10/24/2022   CHF (congestive heart failure) (HCC) 10/10/2022   Rectal bleed 10/08/2022   Panic disorder 10/07/2022   Hematuria 10/02/2022   Right leg pain 07/17/2022   Itching 05/12/2022   Dysuria 12/20/2021   Stroke (cerebrum) (HCC) 11/25/2021   TIA (transient ischemic attack) 11/24/2021   Acute CVA (cerebrovascular accident) (HCC) 11/24/2021   Glaucoma 11/24/2021   Senile dementia (HCC) 11/07/2021   Hypothyroidism 03/05/2021   Atrial flutter (HCC) 03/05/2021   Cognitive impairment 03/05/2021   Urinary frequency 07/13/2020   Syncope 04/16/2020   Orthostatic hypotension 04/07/2020   Syncope and collapse 04/06/2020   Fall 04/06/2020   Diarrhea 10/07/2019   Muscle spasm of right lower extremity 08/05/2019   Hyponatremia 08/05/2019   Insomnia 08/05/2019   Slow transit constipation 08/05/2019   Atherosclerosis of abdominal aorta (HCC) 10/02/2015   Obesity (BMI 30-39.9) 10/31/2014   CKD (chronic kidney disease) stage 3, GFR 30-59 ml/min (HCC) 10/31/2014   Ventral incisional hernia 10/31/2014   Primary peritoneal carcinomatosis (HCC) 10/31/2014   Incisional hernia, periumbilical, without obstruction or gangrene 11/03/2012   Iron deficiency anemia 04/03/2011   Ovarian cancer (HCC) 01/25/2011   HLD (hyperlipidemia) 04/23/2009   Essential hypertension 04/23/2009   GERD 04/23/2009   Osteoarthritis 04/23/2009  Osteoporosis 04/23/2009   Home Medication(s) Prior to Admission medications   Medication Sig Start Date End Date Taking? Authorizing Provider  apixaban (ELIQUIS) 5 MG TABS tablet Take 1 tablet (5 mg total) by mouth 2 (two) times daily. 11/29/21  Yes [name], MD  acetaminophen (TYLENOL) 325 MG tablet Take 650 mg by mouth every 4 (four) hours as needed for moderate pain or fever.    [provider]  ALPRAZolam Prudy Feeler) 0.5 MG tablet Take 1 tablet (0.5 mg total) by mouth every 12 (twelve) hours as needed for  anxiety. 10/15/22   Medina-Vargas, Monina C, NP  atorvastatin (LIPITOR) 40 MG tablet Take 1 tablet (40 mg total) by mouth daily. 11/28/21   [name], MD  B Complex-C-Folic Acid (B-COMPLEX/FOLIC ACID/VITAMIN C PO) Take 1 tablet by mouth daily with breakfast.    [provider]  bimatoprost (LUMIGAN) 0.03 % ophthalmic solution Place 1 drop into both eyes at bedtime.    [provider]  Cholecalciferol (VITAMIN D3) 50 MCG (2000 UT) TABS Take 2,000 Units by mouth in the morning.    [provider]  cloNIDine (CATAPRES) 0.1 MG tablet Take 0.1 mg by mouth daily as needed (hypertension).    [provider]  docusate sodium (COLACE) 100 MG capsule Take 100 mg by mouth daily.    [provider]  ferrous sulfate 325 (65 FE) MG EC tablet Take 325 mg by mouth 2 (two) times a week. Mon, Thrusday    [provider]  furosemide (LASIX) 20 MG tablet Take 10 mg by mouth daily.    [provider]  lactose free nutrition (BOOST) LIQD Take 237 mLs by mouth 3 (three) times daily between meals.    [provider]  levothyroxine (SYNTHROID) 25 MCG tablet Take 25 mcg by mouth daily before breakfast.    [provider]  Magnesium Oxide 400 MG CAPS Take 400 mg daily 10/23/22   [name], MD  metoprolol tartrate (LOPRESSOR) 25 MG tablet Take 25 mg by mouth 2 (two) times daily.    [provider]  mirabegron ER (MYRBETRIQ) 25 MG TB24 tablet Take 25 mg by mouth daily.    [provider]  nystatin (MYCOSTATIN/NYSTOP) powder Apply 1 application  topically 2 (two) times daily as needed (reason not listed).    [provider]  OXYGEN Inhale 2 L into the lungs as needed (if spo2 < 90 %).    [provider]  pantoprazole (PROTONIX) 40 MG tablet Take 40 mg by mouth daily.    [provider]  polyethylene glycol (MIRALAX / GLYCOLAX) 17 g packet Take 17 g by mouth every other day.    [provider]  polyvinyl alcohol (LIQUIFILM TEARS) 1.4 % ophthalmic solution Place 2 drops into both eyes in the morning, at noon, in the evening, and at bedtime.    [provider]  potassium chloride (KLOR-CON) 10 MEQ tablet Take 10 mEq by mouth daily.    [provider]  sodium chloride 1 g tablet Take 1 g by mouth 2 (two) times daily.    [provider]  Past Surgical History Past Surgical History:  Procedure Laterality Date   ABDOMINAL HYSTERECTOMY  04/01/2011   Procedure: HYSTERECTOMY ABDOMINAL;  Surgeon: Jeannette Corpus, MD;  Location: [facility];  Service: Gynecology;  Laterality: N/A;   APPENDECTOMY     JOINT REPLACEMENT     R knee in 2008, 5 operations on L knee   LAPAROTOMY  04/01/2011   Procedure: EXPLORATORY LAPAROTOMY;  Surgeon: Jeannette Corpus, MD;  Location: [facility];  Service: Gynecology;  Laterality: N/A;   OTHER SURGICAL HISTORY     hx of C section 1966   SALPINGOOPHORECTOMY  04/01/2011   Procedure: SALPINGO OOPHERECTOMY;  Surgeon: Jeannette Corpus, MD;  Location: [facility];  Service: Gynecology;  Laterality: Bilateral;   TONSILLECTOMY     Family History Family History  Problem Relation Age of Onset   Cancer Other        Bladder cancer   Heart attack Brother    Diabetes Brother     Social History Social History   Tobacco Use   Smoking status: Former    Current packs/day: 0.00    Types: Cigarettes    Quit date: 02/11/1952    Years since quitting: 70.7   Smokeless tobacco: Never  Vaping Use   Vaping status: Never Used  Substance Use Topics   Alcohol use: Not Currently   Drug use: Never   Allergies Morphine and codeine, Morphine sulfate, Penicillins, Nsaids, Penicillin v, Shellfish allergy, and Shellfish-derived products  Review of Systems Review of Systems  All other systems reviewed  and are negative.   Physical Exam Vital Signs  I have reviewed the triage vital signs BP (!) 131/59   Pulse 75   Temp 98.4 F (36.9 C) (Oral)   Resp 18   Ht 5\' 4"  (1.626 m)   Wt 80.6 kg   SpO2 100%   BMI 30.49 kg/m  Physical Exam Vitals and nursing note reviewed.  Constitutional:      General: She is not in acute distress.    Appearance: She is well-developed.  HENT:     Head: Normocephalic and atraumatic.     Mouth/Throat:     Mouth: Mucous membranes are moist.  Eyes:     Pupils: Pupils are equal, round, and reactive to light.  Cardiovascular:     Rate and Rhythm: Normal rate and regular rhythm.     Heart sounds: No murmur heard. Pulmonary:     Effort: Pulmonary effort is normal. No respiratory distress.     Breath sounds: Normal breath sounds.  Abdominal:     General: Abdomen is flat.     Palpations: Abdomen is soft.     Tenderness: There is no abdominal tenderness.  Genitourinary:    Comments: Chaperoned by RN, rectal exam with brown stool with streaks of bright red blood Musculoskeletal:        General: No tenderness.     Right lower leg: No edema.     Left lower leg: No edema.  Skin:    General: Skin is warm and dry.  Neurological:     General: No focal deficit present.     Mental Status: She is alert. Mental status is at baseline.  Psychiatric:        Mood and Affect: Mood normal.        Behavior: Behavior normal.     ED Results and Treatments Labs (all labs ordered are listed, but only abnormal results are displayed) Labs Reviewed  COMPREHENSIVE METABOLIC PANEL - Abnormal;  Notable for the following components:      Result Value   Sodium 132 (*)    Chloride 95 (*)    Glucose, Bld 131 (*)    Creatinine, Ser 1.18 (*)    Calcium 8.7 (*)    Albumin 2.4 (*)    GFR, Estimated 44 (*)    All other components within normal limits  CBC WITH DIFFERENTIAL/PLATELET - Abnormal; Notable for the following components:   Hemoglobin 10.7 (*)    HCT 33.3 (*)     RDW 16.0 (*)    Platelets 434 (*)    Monocytes Absolute 1.1 (*)    Abs Immature Granulocytes 0.09 (*)    All other components within normal limits  PROTIME-INR - Abnormal; Notable for the following components:   Prothrombin Time 22.4 (*)    INR 1.9 (*)    All other components within normal limits  POC OCCULT BLOOD, ED - Abnormal; Notable for the following components:   Fecal Occult Bld POSITIVE (*)    All other components within normal limits  CBC  CBC  CBC  TYPE AND SCREEN                                                                                                                          Radiology No results found.  Pertinent labs & imaging results that were available during my care of the patient were reviewed by me and considered in my medical decision making (see MDM for details).  Medications Ordered in ED Medications - No data to display                                                                                                                                   Procedures Procedures  (including critical care time)  Medical Decision Making / ED Course   MDM:  89 year old presenting to the emergency department with bright red blood in stool.  Patient overall well-appearing, physical exam with some bright red blood on rectal exam.  Vitals reassuring.  Will check labs including hemoglobin, type and screen.  May need admission for monitoring given that she is on anticoagulation.  Clinical Course as of 10/28/22 1454  Tue Oct 28, 2022  1452 Discussed case with [facility], Dr. Tomasa Rand will consult.  Discussed with attending hospitalist Dr. Sheppard Penton who will admit patient for observation given bleeding. [WS]    Clinical Course  User Index [WS] Lonell Grandchild, MD     Additional history obtained: -Additional history obtained from ems -External records from outside source obtained and reviewed including: Chart review including previous notes, labs,  imaging, consultation notes including nursing home notes   Lab Tests: -I ordered, reviewed, and interpreted labs.   The pertinent results include:   Labs Reviewed  COMPREHENSIVE METABOLIC PANEL - Abnormal; Notable for the following components:      Result Value   Sodium 132 (*)    Chloride 95 (*)    Glucose, Bld 131 (*)    Creatinine, Ser 1.18 (*)    Calcium 8.7 (*)    Albumin 2.4 (*)    GFR, Estimated 44 (*)    All other components within normal limits  CBC WITH DIFFERENTIAL/PLATELET - Abnormal; Notable for the following components:   Hemoglobin 10.7 (*)    HCT 33.3 (*)    RDW 16.0 (*)    Platelets 434 (*)    Monocytes Absolute 1.1 (*)    Abs Immature Granulocytes 0.09 (*)    All other components within normal limits  PROTIME-INR - Abnormal; Notable for the following components:   Prothrombin Time 22.4 (*)    INR 1.9 (*)    All other components within normal limits  POC OCCULT BLOOD, ED - Abnormal; Notable for the following components:   Fecal Occult Bld POSITIVE (*)    All other components within normal limits  CBC  CBC  CBC  TYPE AND SCREEN    Notable for positive FOBT. Mild anemia. Elevated INR  EKG   EKG Interpretation Date/Time:  Tuesday October 28 2022 12:27:11 EDT Ventricular Rate:  82 PR Interval:  162 QRS Duration:  68 QT Interval:  496 QTC Calculation: 580 R Axis:   2  Text Interpretation: Sinus rhythm Low voltage, precordial leads Borderline T abnormalities, anterior leads Prolonged QT interval Confirmed by Alvino Blood ([id number]) on 10/28/2022 12:31:24 PM          Medicines ordered and prescription drug management: No orders of the defined types were placed in this encounter.   -I have reviewed the patients home medicines and have made adjustments as needed   Consultations Obtained: I requested consultation with the gastroenterologist,  and discussed lab and imaging findings as well as pertinent plan - they recommend: they will  consult   Cardiac Monitoring: The patient was maintained on a cardiac monitor.  I personally viewed and interpreted the cardiac monitored which showed an underlying rhythm of: NSR  Social Determinants of Health:  Diagnosis or treatment significantly limited by social determinants of health: obesity   Reevaluation: After the interventions noted above, I reevaluated the patient and found that their symptoms have stayed the same  Co morbidities that complicate the patient evaluation  Past Medical History:  Diagnosis Date   Anemia associated with acute blood loss 04/03/2011   Arthritis    knees    Ascites    Blood transfusion    hx of 2011    Complication of anesthesia    Sodium drops per pt    Cystitis    DIARRHEA, ANTIBIOTIC ASSOCIATED 05/31/2009   Qualifier: Diagnosis of  By: Daiva Eves MD, Cornelius     GERD (gastroesophageal reflux disease)    H/O hiatal hernia    Hyperlipidemia    Hypertension    Hyponatremia    Neutropenia with fever (HCC) 05/18/2011   OSTEOMYELITIS, CHRONIC, LOWER LEG 04/23/2009   Qualifier: Diagnosis of  By: Ninetta Lights  MD, Tinnie Gens     Osteopenia    OSTEOPOROSIS 04/23/2009   Qualifier: Diagnosis of  By: Ninetta Lights MD, Tinnie Gens     Ovarian cancer (HCC) 01/25/2011   Pneumonia    hx of    Postmenopausal atrophic vaginitis    PROSTHETIC JOINT COMPLICATION 05/28/2009   Qualifier: Diagnosis of  By: Daiva Eves MD, Cornelius     Renal disorder    Decreased kidney function   Staph infection 2010   after knee replacement   Urinary frequency    Vulvitis       Dispostion: Disposition decision including need for hospitalization was considered, and patient admitted to the hospital.    Final Clinical Impression(s) / ED Diagnoses Final diagnoses:  Rectal bleeding     This chart was dictated using voice recognition software.  Despite best efforts to proofread,  errors can occur which can change the documentation meaning.    Lonell Grandchild, MD 10/28/22  [phone]

## 2022-10-28 NOTE — Assessment & Plan Note (Signed)
f/u Cardiology 10/16/22, off anticoagulation 2/2 falls in the past, restarted Eliquis after acute CVA 11/24/21, heart rate is controlled on Metoprolol

## 2022-10-28 NOTE — Assessment & Plan Note (Signed)
Continue lipitor, hold eliquis

## 2022-10-28 NOTE — Assessment & Plan Note (Addendum)
Repeat echo as she can't do outpatient per daughter  Rate controlled and in NSR Hold eliquis for now, continue lopressor  ? CHF from xray at her SNF. Check CXR here. She is not short of breath, has no leg swelling and is not hypoxic. Appears euvolemic

## 2022-10-28 NOTE — H&P (Addendum)
History and Physical    Patient: [name]:[id number] DOB: 10-20-1929 DOA: 10/28/2022 DOS: the patient was seen and examined on 10/28/2022 PCP: Mast, Man X, NP  Patient coming from: SNF - [facility]. Bedbound   Chief Complaint: blood in stool   HPI: [name] is a 89 y.o. female with medical history significant of GERD, HTN, HLD, hx of ovarian cancer, hx of CVA in 10/23, CKD stage 3, atrial flutter on eliquis, hypothyroidism, memory loss who presented to ED with concerns for bright red blood in her stool. A few weeks ago they saw that she had blood in her stool. Her hgb was checked and it was down some, but repeat it was better. Today when she was getting changed she had quite a bit of blood in her diaper and smeared on her stomach. They called her daughter and they called EMS.   Last took eliquis at 10:30AM this morning.   She had covid back in August. Since that time she has declined. She had a UTI following covid with some confusion and was put on cipro. She then had afib with hypoxia down into the 80s and put her on lasix low dose for CHF exacerbation. Had CXR done at facility and started on 10mg  lasix. Wanted to get an echo outpatient, but she can not sit up for this.   Dementia baseline: last fall she had her 2nd stroke and seemed to make thing worse. She is able to tell you her name and birthday. She has been sleeping more and not eating much with weight loss. She needs full assist with ADLs. Has had more of a decline since she had Covid a month ago.   She does not smoke or drink alcohol.   ER Course:  vitals: afebrile, bp: 131/59, HR: 75, RR: 18, oxygen: 100%RA Pertinent labs: hgb: 10.7, sodium: 132, creatinine: 1.18, INR: 1.9, fecal occult positive In ED: type and screen and GI consult. [facility] asked to admit.    Review of Systems: unable to review all systems due to the inability of the patient to answer questions. Past Medical History:  Diagnosis Date    Anemia associated with acute blood loss 04/03/2011   Arthritis    knees    Ascites    Blood transfusion    hx of 2011    Complication of anesthesia    Sodium drops per pt    Cystitis    DIARRHEA, ANTIBIOTIC ASSOCIATED 05/31/2009   Qualifier: Diagnosis of  By: Daiva Eves MD, Cornelius     GERD (gastroesophageal reflux disease)    H/O hiatal hernia    Hyperlipidemia    Hypertension    Hyponatremia    Neutropenia with fever (HCC) 05/18/2011   OSTEOMYELITIS, CHRONIC, LOWER LEG 04/23/2009   Qualifier: Diagnosis of  By: Ninetta Lights MD, Linus Mako    OSTEOPOROSIS 04/23/2009   Qualifier: Diagnosis of  By: Ninetta Lights MD, Jeffrey     Ovarian cancer (HCC) 01/25/2011   Pneumonia    hx of    Postmenopausal atrophic vaginitis    PROSTHETIC JOINT COMPLICATION 05/28/2009   Qualifier: Diagnosis of  By: Daiva Eves MD, Cornelius     Renal disorder    Decreased kidney function   Staph infection 2010   after knee replacement   Urinary frequency    Vulvitis    Past Surgical History:  Procedure Laterality Date   ABDOMINAL HYSTERECTOMY  04/01/2011   Procedure: HYSTERECTOMY ABDOMINAL;  Surgeon: Reuel Boom  Michail Jewels, MD;  Location: [facility];  Service: Gynecology;  Laterality: N/A;   APPENDECTOMY     JOINT REPLACEMENT     R knee in 2008, 5 operations on L knee   LAPAROTOMY  04/01/2011   Procedure: EXPLORATORY LAPAROTOMY;  Surgeon: Jeannette Corpus, MD;  Location: [facility];  Service: Gynecology;  Laterality: N/A;   OTHER SURGICAL HISTORY     hx of C section 1966   SALPINGOOPHORECTOMY  04/01/2011   Procedure: SALPINGO OOPHERECTOMY;  Surgeon: Jeannette Corpus, MD;  Location: [facility];  Service: Gynecology;  Laterality: Bilateral;   TONSILLECTOMY     Social History:  reports that she quit smoking about 70 years ago. Her smoking use included cigarettes. She has never used smokeless tobacco. She reports that she does not currently use alcohol. She reports that she does not use drugs.  Allergies   Allergen Reactions   Morphine And Codeine Anaphylaxis and Other (See Comments)    Given after knee replacement and code blue occurred   Morphine Sulfate Anaphylaxis   Penicillins Hives, Itching and Other (See Comments)    Syncope, also   Nsaids Other (See Comments)    Because of an abnormal kidney test result   Penicillin V Hives   Shellfish Allergy Nausea And Vomiting and Other (See Comments)    Pt has shellfish allergy only.  Has had IV contrast x 2 and did fine.   Shellfish-Derived Products Nausea And Vomiting    Family History  Problem Relation Age of Onset   Cancer Other        Bladder cancer   Heart attack Brother    Diabetes Brother     Prior to Admission medications   Medication Sig Start Date End Date Taking? Authorizing Provider  acetaminophen (TYLENOL) 325 MG tablet Take 650 mg by mouth every 4 (four) hours as needed for moderate pain or fever.    [provider]  ALPRAZolam Prudy Feeler) 0.5 MG tablet Take 1 tablet (0.5 mg total) by mouth every 12 (twelve) hours as needed for anxiety. 10/15/22   Medina-Vargas, Monina C, NP  apixaban (ELIQUIS) 5 MG TABS tablet Take 1 tablet (5 mg total) by mouth 2 (two) times daily. 11/29/21   [name], MD  atorvastatin (LIPITOR) 40 MG tablet Take 1 tablet (40 mg total) by mouth daily. 11/28/21   [name], MD  B Complex-C-Folic Acid (B-COMPLEX/FOLIC ACID/VITAMIN C PO) Take 1 tablet by mouth daily with breakfast.    [provider]  bimatoprost (LUMIGAN) 0.03 % ophthalmic solution Place 1 drop into both eyes at bedtime.    [provider]  Cholecalciferol (VITAMIN D3) 50 MCG (2000 UT) TABS Take 2,000 Units by mouth in the morning.    [provider]  cloNIDine (CATAPRES) 0.1 MG tablet Take 0.1 mg by mouth daily as needed (hypertension).    [provider]  docusate sodium (COLACE) 100 MG capsule Take 100 mg by mouth daily.    [provider]  ferrous sulfate 325 (65 FE) MG EC tablet Take 325  mg by mouth 2 (two) times a week. Mon, Thrusday    [provider]  furosemide (LASIX) 20 MG tablet Take 10 mg by mouth daily.    [provider]  lactose free nutrition (BOOST) LIQD Take 237 mLs by mouth 3 (three) times daily between meals.    [provider]  levothyroxine (SYNTHROID) 25 MCG tablet Take 25 mcg by mouth daily before breakfast.    [provider]  Magnesium Oxide 400 MG CAPS Take 400 mg daily 10/23/22   [name], MD  metoprolol tartrate (LOPRESSOR) 25 MG tablet Take 25 mg by mouth 2 (two) times daily.    [provider]  mirabegron ER (MYRBETRIQ) 25 MG TB24 tablet Take 25 mg by mouth daily.    [provider]  nystatin (MYCOSTATIN/NYSTOP) powder Apply 1 application  topically 2 (two) times daily as needed (reason not listed).    [provider]  OXYGEN Inhale 2 L into the lungs as needed (if spo2 < 90 %).    [provider]  pantoprazole (PROTONIX) 40 MG tablet Take 40 mg by mouth daily.    [provider]  polyethylene glycol (MIRALAX / GLYCOLAX) 17 g packet Take 17 g by mouth every other day.    [provider]  polyvinyl alcohol (LIQUIFILM TEARS) 1.4 % ophthalmic solution Place 2 drops into both eyes in the morning, at noon, in the evening, and at bedtime.    [provider]  potassium chloride (KLOR-CON) 10 MEQ tablet Take 10 mEq by mouth daily.    [provider]  sodium chloride 1 g tablet Take 1 g by mouth 2 (two) times daily.    [provider]    Physical Exam: Vitals:   10/28/22 1330 10/28/22 1500 10/28/22 1530 10/28/22 1701  BP: (!) 131/59 131/67 137/63   Pulse: 75 79 81   Resp: 18 20 (!) 24   Temp:    99.6 F (37.6 C)  TempSrc:    Axillary  SpO2: 100% 97% 98%   Weight:      Height:       General:  Appears calm and comfortable and is in NAD. Limited speech  Eyes:  PERRL, EOMI, normal lids, iris. Drainage from left eye. Clear   ENT:  HOH, lips & tongue, mmm; edentulous  Neck:  no LAD, masses or thyromegaly; no carotid bruits, no JVD  Cardiovascular:  RRR, +systolic murmur. No LE edema.  Respiratory:   CTA bilaterally with no wheezes/rales/rhonchi.  Normal respiratory effort. Abdomen:  soft, NT, ND, NABS Back:   normal alignment, no CVAT Skin:  no rash or induration seen on limited exam Musculoskeletal:  grossly normal tone BUE/BLE, good ROM, no bony abnormality Lower extremity:  No LE edema.  Limited foot exam with no ulcerations.  2+ distal pulses. Psychiatric:  mood and affect flat. Limited vocal communication. Aox1. At baseline  Neurologic:  CN 2-12 grossly intact, moves all extremities in coordinated fashion, sensation intact   Radiological Exams on Admission: Independently reviewed - see discussion in A/P where applicable  DG CHEST PORT 1 VIEW  Result Date: 10/28/2022 CLINICAL DATA:  Acute exacerbation of CHF.  Shortness of breath. EXAM: PORTABLE CHEST 1 VIEW COMPARISON:  01/22/2022 FINDINGS: Lung volumes are low. Grossly stable heart size, left heart border partially obscured by pleural effusion and opacity. Aortic atherosclerosis. Opacification of the lower left hemithorax likely related to tracking pleural effusion and atelectasis. Slight vascular congestion. No pneumothorax. IMPRESSION: 1. Low lung volumes. Opacification of the lower left hemithorax likely related to tracking pleural effusion and atelectasis. 2. Slight vascular congestion. Electronically Signed   By: Narda Rutherford M.D.   On: 10/28/2022 16:34    EKG: Independently reviewed.  NSR with rate 82; nonspecific ST changes with no evidence of acute ischemia. Prolonged QT    Labs on Admission: I have personally reviewed the available labs and imaging studies at the time  of the admission.  Pertinent labs:    hgb: 10.7,  sodium: 132,  creatinine: 1.18,  INR: 1.9,  fecal occult positive  Assessment and Plan: Principal Problem:   Lower GI  bleed Active Problems:   Dysphagia   Failure to thrive in adult   Prolonged QT interval   Atrial flutter (HCC)   Essential hypertension   History of CVA (cerebrovascular accident)   Hypothyroidism   Hyponatremia   Senile dementia (HCC)   Ovarian cancer (HCC)   GERD   CKD (chronic kidney disease) stage 3, GFR 30-59 ml/min (HCC)   HLD (hyperlipidemia)    Assessment and Plan: * Lower GI bleed 89 year old female presenting from SNF for bright red blood in stool with +fecal occult  -obs to progressive -on eliquis for CVA/atrial fib. Last dose today in the AM. will hold this.  -no active bleed here. Hgb stable -serial cbc q 6 hours. Transfuse if hgb <7.0. Daughter consented to transfusion if needed  -Has had slow drop in hgb since August 2024 when they first noticed bleeding, but no anemia prior to this  -Gi has been consulted -liquid diet for now  -daughter can't remember when last colonoscopy was. NO family hx of colon cancer   Dysphagia Per SNF she has been coughing after taking pills They have started to crush pills  Will have SLP see  Aspiration precautions   Failure to thrive in adult More rapid decline over last month with weight loss, decreased PO intake and desire to eat and decreased mobility. Now bed bound ? Post covid vs. Worsening dementia   -palliative consult -nutrition consult   Prolonged QT interval Optimize electrolytes Keep on telemetry Avoid qt prolonging drugs  Repeat ekg in AM    Atrial flutter (HCC) Repeat echo as she can't do outpatient per daughter  Rate controlled and in NSR Hold eliquis for now, continue lopressor  ? CHF from xray at her SNF. Check CXR here. She is not short of breath, has no leg swelling and is not hypoxic. Appears euvolemic   History of CVA (cerebrovascular accident) Continue lipitor, hold eliquis   Hypothyroidism TSH wnl in 03/2022. With atrial fib last month will check TSH  Continue home synthroid daily    Hyponatremia Sodium at 132, at baseline   Senile dementia [facility]) Delirium precautions  Fall precautions   Ovarian cancer (HCC) Stage 3, diagnosed in 2012 She had surgery and 3 rounds of surgery  Cancer free since that time.   GERD Continue PPI   CKD (chronic kidney disease) stage 3, GFR 30-59 ml/min (HCC) Baseline creatinine of 1.2 Stable, continue to monitor   HLD (hyperlipidemia) Continue lipitor     Advance Care Planning:   Code Status: Full Code   Consults: [facility]/palliative care/nutrition/SLP   DVT Prophylaxis: SCDs   Family Communication: daughter at bedside   Severity of Illness: The appropriate patient status for this patient is OBSERVATION. Observation status is judged to be reasonable and necessary in order to provide the required intensity of service to ensure the patient's safety. The patient's presenting symptoms, physical exam findings, and initial radiographic and laboratory data in the context of their medical condition is felt to place them at decreased risk for further clinical deterioration. Furthermore, it is anticipated that the patient will be medically stable for discharge from the hospital within 2 midnights of admission.   Author: Orland Mustard, MD 10/28/2022 8:01 PM  For on call review [email or web address].

## 2022-10-28 NOTE — Assessment & Plan Note (Signed)
Stage 3, diagnosed in 2012 She had surgery and 3 rounds of surgery  Cancer free since that time.

## 2022-10-28 NOTE — Assessment & Plan Note (Signed)
More rapid decline over last month with weight loss, decreased PO intake and desire to eat and decreased mobility. Now bed bound ? Post covid vs. Worsening dementia   -palliative consult -nutrition consult

## 2022-10-28 NOTE — Assessment & Plan Note (Signed)
on Mirabegron, hx of UTI

## 2022-10-28 NOTE — Assessment & Plan Note (Signed)
R+L buttock irritated rash, superficial openings, incontinent of B+B, better.

## 2022-10-28 NOTE — Assessment & Plan Note (Signed)
SNF [facility] for supportive care, 01/29/22 MMSE 23/30

## 2022-10-28 NOTE — Assessment & Plan Note (Signed)
compensated clinically, BLE edema and cough improved, on Furosemide, [facility] CXR CHF mild left effusion. saw Cardiology 10/15/22, no change of medication, placed on Mg, pending BMP/Mg, scheduled echocardiogram

## 2022-10-28 NOTE — Progress Notes (Signed)
Location:   SNF [facility] Room Number: 41 Place of Service:  SNF (31) Provider: Arna Snipe Brandee Markin NP  [place] Current X, NP  Patient Care Team: Tyla Burgner X, NP as PCP - General (Internal Medicine) [name], MD as PCP - Cardiology (Cardiology) Olivia Mackie, MD as Consulting Physician (Obstetrics and Gynecology) De Blanch, MD as Consulting Physician (Gynecology) Cleda Mccreedy, MD as Consulting Physician (Gynecologic Oncology)  Extended Emergency Contact Information Primary Emergency Contact: [facility] Address: [address]          [place], [place] [address] [place] of [place] Home Phone: [phone] Mobile Phone: [phone] Relation: Daughter  Code Status: DNR Goals of care: Advanced Directive information    10/28/2022   11:04 AM  Advanced Directives  Does Patient Have a Medical Advance Directive? Yes  Type of Advance Directive Healthcare Power of Attorney  Does patient want to make changes to medical advance directive? Yes (Inpatient - patient defers changing a medical advance directive at this time - Information given)  Copy of Healthcare Power of Attorney in Chart? Yes - validated most recent copy scanned in chart (See row information)     Chief Complaint  Patient presents with   Acute Visit    Blood in stool    HPI:  Pt is a 89 y.o. female seen today for an acute visit for blood in stool, no nausea, vomiting, constipation, diarrhea, abd pain noted, afebrile. On eliquis for CVA, Pantoprazole for GERD/GI protection.     R+L buttock irritated rash, superficial openings, incontinent of B+B, better.                CHF, compensated clinically, BLE edema and cough improved, on Furosemide, [facility] CXR CHF mild left effusion. saw Cardiology 10/15/22, no change of medication, placed on Mg, pending BMP/Mg, scheduled echocardiogram              IDA, Hgb 9.6 10/07/22(12.9 07/17/22)<< Hgb 11.0 Iron 27, Vit B12 1179, Folate 6.6 10/09/22, start Fe  325mg  2x/wk, +FOBT x2/3 after Iron started.              Rectal bleed 10/02/22, 10/18/22 found blood on diaper,  FOBT 10/02/22 negative, +2/3 10/13/22, Iron started 10/09/22.  Denied abd pain, indigestion, N/V/D, or constipation.              UTI, treated with Cipro since 10/06/22 11/24/21 acute CVA left PCA embolic strokes, no apparent residual symptoms, on Eliquis f/u Neurology             CKD, Bun/creat 15/0.8 10/21/22             GERD, taking Protonix             Hypothyroidism, takes Levothyroxine, TSH 3.19 03/27/22             Hyponatremia, Na 133 10/21/22             Syncope: Orthostasis ?, vasovagal episode 08/31/22,  no further recurrence              A flutter/Afib, f/u Cardiology 10/16/22, off anticoagulation 2/2 falls in the past, restarted Eliquis after acute CVA 11/24/21, heart rate is controlled on Metoprolol             Hx of ovarian CA s/p TAH/BSO 03/2011, in remission.             Senile dementia,  SNF [facility] for supportive care, 01/29/22 MMSE 23/30  Anxiety, prn Alprazolam             Constipation takes Colace. MiraLax qod             HTN, on Metoprolol, off Amlodipine, prn Clonidine             OP, takes Ca, Vit D             Insomnia, off Ambien, off Melatonin. Prn Alprazolam.              Hyperlipidemia, on Atorvastatin, LDL 127 11/25/21             OA, chronic R ankle pain, uses ankle brace, mechanical lift for transfer,  w/c to get around.              Urinary frequency, on Mirabegron                  Past Medical History:  Diagnosis Date   Anemia associated with acute blood loss 04/03/2011   Arthritis    knees    Ascites    Blood transfusion    hx of 2011    Complication of anesthesia    Sodium drops per pt    Cystitis    DIARRHEA, ANTIBIOTIC ASSOCIATED 05/31/2009   Qualifier: Diagnosis of  By: Daiva Eves MD, Cornelius     GERD (gastroesophageal reflux disease)    H/O hiatal hernia    Hyperlipidemia    Hypertension    Hyponatremia    Neutropenia with fever (HCC)  05/18/2011   OSTEOMYELITIS, CHRONIC, LOWER LEG 04/23/2009   Qualifier: Diagnosis of  By: Ninetta Lights MD, Linus Mako    OSTEOPOROSIS 04/23/2009   Qualifier: Diagnosis of  By: Ninetta Lights MD, Jeffrey     Ovarian cancer (HCC) 01/25/2011   Pneumonia    hx of    Postmenopausal atrophic vaginitis    PROSTHETIC JOINT COMPLICATION 05/28/2009   Qualifier: Diagnosis of  By: Daiva Eves MD, Cornelius     Renal disorder    Decreased kidney function   Staph infection 2010   after knee replacement   Urinary frequency    Vulvitis    Past Surgical History:  Procedure Laterality Date   ABDOMINAL HYSTERECTOMY  04/01/2011   Procedure: HYSTERECTOMY ABDOMINAL;  Surgeon: Jeannette Corpus, MD;  Location: [facility];  Service: Gynecology;  Laterality: N/A;   APPENDECTOMY     JOINT REPLACEMENT     R knee in 2008, 5 operations on L knee   LAPAROTOMY  04/01/2011   Procedure: EXPLORATORY LAPAROTOMY;  Surgeon: Jeannette Corpus, MD;  Location: [facility];  Service: Gynecology;  Laterality: N/A;   OTHER SURGICAL HISTORY     hx of C section 1966   SALPINGOOPHORECTOMY  04/01/2011   Procedure: SALPINGO OOPHERECTOMY;  Surgeon: Jeannette Corpus, MD;  Location: [facility];  Service: Gynecology;  Laterality: Bilateral;   TONSILLECTOMY      Allergies  Allergen Reactions   Morphine And Codeine Anaphylaxis and Other (See Comments)    Given after knee replacement and code blue occurred   Morphine Sulfate Anaphylaxis   Penicillins Hives, Itching and Other (See Comments)    Syncope, also   Nsaids Other (See Comments)    Because of an abnormal kidney test result   Penicillin V Hives   Shellfish Allergy Nausea And Vomiting and Other (See Comments)    Pt has shellfish allergy only.  Has had IV contrast x 2 and did fine.  Shellfish-Derived Products Nausea And Vomiting    Allergies as of 10/28/2022       Reactions   Morphine And Codeine Anaphylaxis, Other (See Comments)   Given after knee replacement and code  blue occurred   Morphine Sulfate Anaphylaxis   Penicillins Hives, Itching, Other (See Comments)   Syncope, also   Nsaids Other (See Comments)   Because of an abnormal kidney test result   Penicillin V Hives   Shellfish Allergy Nausea And Vomiting, Other (See Comments)   Pt has shellfish allergy only.  Has had IV contrast x 2 and did fine.   Shellfish-derived Products Nausea And Vomiting        Medication List        Accurate as of October 28, 2022 11:25 AM. If you have any questions, ask your nurse or doctor.          acetaminophen 325 MG tablet Commonly known as: TYLENOL Take 650 mg by mouth every 4 (four) hours as needed for moderate pain or fever.   ALPRAZolam 0.5 MG tablet Commonly known as: XANAX Take 1 tablet (0.5 mg total) by mouth every 12 (twelve) hours as needed for anxiety.   apixaban 5 MG Tabs tablet Commonly known as: ELIQUIS Take 1 tablet (5 mg total) by mouth 2 (two) times daily.   atorvastatin 40 MG tablet Commonly known as: LIPITOR Take 1 tablet (40 mg total) by mouth daily.   B-COMPLEX/FOLIC ACID/VITAMIN C PO Take 1 tablet by mouth daily with breakfast.   bimatoprost 0.03 % ophthalmic solution Commonly known as: LUMIGAN Place 1 drop into both eyes at bedtime.   cloNIDine 0.1 MG tablet Commonly known as: CATAPRES Take 0.1 mg by mouth daily as needed (hypertension).   docusate sodium 100 MG capsule Commonly known as: COLACE Take 100 mg by mouth daily.   ferrous sulfate 325 (65 FE) MG EC tablet Take 325 mg by mouth 2 (two) times a week. Mon, Thrusday   furosemide 20 MG tablet Commonly known as: LASIX Take 10 mg by mouth daily.   lactose free nutrition Liqd Take 237 mLs by mouth 3 (three) times daily between meals.   levothyroxine 25 MCG tablet Commonly known as: SYNTHROID Take 25 mcg by mouth daily before breakfast.   Magnesium Oxide 400 MG Caps Take 400 mg daily   metoprolol tartrate 25 MG tablet Commonly known as:  LOPRESSOR Take 25 mg by mouth 2 (two) times daily.   Myrbetriq 25 MG Tb24 tablet Generic drug: mirabegron ER Take 25 mg by mouth daily.   nystatin powder Commonly known as: MYCOSTATIN/NYSTOP Apply 1 application  topically 2 (two) times daily as needed (reason not listed).   OXYGEN Inhale 2 L into the lungs as needed (if spo2 < 90 %).   pantoprazole 40 MG tablet Commonly known as: PROTONIX Take 40 mg by mouth daily.   polyethylene glycol 17 g packet Commonly known as: MIRALAX / GLYCOLAX Take 17 g by mouth every other day.   polyvinyl alcohol 1.4 % ophthalmic solution Commonly known as: LIQUIFILM TEARS Place 2 drops into both eyes in the morning, at noon, in the evening, and at bedtime.   potassium chloride 10 MEQ tablet Commonly known as: KLOR-CON Take 10 mEq by mouth daily.   sodium chloride 1 g tablet Take 1 g by mouth 2 (two) times daily.   Vitamin D3 50 MCG (2000 UT) Tabs Take 2,000 Units by mouth in the morning.        Review of  Systems  Constitutional:  Negative for appetite change, fatigue and fever.  HENT:  Positive for hearing loss. Negative for congestion and trouble swallowing.   Eyes:  Negative for visual disturbance.  Respiratory:  Negative for cough.   Cardiovascular:  Negative for leg swelling.  Gastrointestinal:  Positive for anal bleeding and blood in stool. Negative for abdominal distention, abdominal pain, constipation, diarrhea, nausea, rectal pain and vomiting.  Genitourinary:  Positive for frequency. Negative for dysuria and urgency.  Musculoskeletal:  Positive for arthralgias, back pain and gait problem.       R ankle pain is chronic  Skin:  Positive for rash.  Neurological:  Negative for speech difficulty, weakness and light-headedness.       Memory deficit  Psychiatric/Behavioral:  Positive for confusion. Negative for sleep disturbance. The patient is not nervous/anxious.     Immunization History  Administered Date(s) Administered    Influenza Split 11/13/2012, 12/13/2012, 11/30/2013, 10/09/2014, 11/24/2018   Influenza, High Dose Seasonal PF 12/01/2013, 10/04/2014, 10/12/2015, 11/06/2016, 11/12/2017   Influenza,inj,Quad PF,6+ Mos 10/24/2010   Influenza-Unspecified 10/24/2010, 11/01/2019, 12/04/2020   Moderna Covid-19 Vaccine Bivalent Booster 71yrs & up 07/24/2021   Moderna SARS-COV2 Booster Vaccination 07/18/2020   Moderna Sars-Covid-2 Vaccination 02/14/2019, 03/14/2019, 12/26/2019   PFIZER(Purple Top)SARS-COV-2 Vaccination 10/31/2020   PPD Test 11/27/2021   Pfizer Covid-19 Vaccine Bivalent Booster 59yrs & up 10/31/2020   Pneumococcal Conjugate-13 09/28/2013   Pneumococcal Polysaccharide-23 03/05/2012   Td 10/16/2010   Td (Adult) 08/27/2000   Tdap 08/27/2000, 10/16/2010   Zoster, Live 01/11/2013, 03/31/2020   Pertinent  Health Maintenance Due  Topic Date Due   INFLUENZA VACCINE  09/11/2022   DEXA SCAN  Completed      02/12/2022    4:30 PM 03/25/2022   11:07 AM 04/03/2022    3:37 PM 04/18/2022    9:19 AM 07/04/2022   10:17 AM  Fall Risk  Falls in the past year? 0 0 0 0 1  Was there an injury with Fall? 0 0 0 0 0  Fall Risk Category Calculator 0 0 0 0 2  Fall Risk Category (Retired) Low      (RETIRED) Patient Fall Risk Level High fall risk      Patient at Risk for Falls Due to History of fall(s);Impaired balance/gait;Impaired mobility;Mental status change History of fall(s);Impaired balance/gait;Impaired mobility;Mental status change History of fall(s);Impaired balance/gait;Impaired mobility;Mental status change History of fall(s);Impaired balance/gait;Impaired mobility;Mental status change History of fall(s);Impaired balance/gait;Impaired mobility;Mental status change  Fall risk Follow up Falls evaluation completed Falls evaluation completed Falls evaluation completed Falls evaluation completed Falls evaluation completed   Functional Status Survey:    Vitals:   10/28/22 1118  BP: 112/69  Pulse: 91  Resp: 18   Temp: 97.8 F (36.6 C)  SpO2: 96%  Weight: 177 lb 9.6 oz (80.6 kg)   Body mass index is 30.48 kg/m. Physical Exam Vitals and nursing note reviewed.  Constitutional:      Appearance: Normal appearance.  HENT:     Head: Normocephalic and atraumatic.     Mouth/Throat:     Mouth: Mucous membranes are moist.  Eyes:     Extraocular Movements: Extraocular movements intact.     Conjunctiva/sclera: Conjunctivae normal.     Pupils: Pupils are equal, round, and reactive to light.  Cardiovascular:     Rate and Rhythm: Normal rate and regular rhythm.  Pulmonary:     Effort: Pulmonary effort is normal.     Breath sounds: Rales present.     Comments:  Posterior left lower lung rales  Abdominal:     General: Bowel sounds are normal. There is no distension.     Palpations: Abdomen is soft.     Tenderness: There is no abdominal tenderness. There is no left CVA tenderness, guarding or rebound.     Hernia: A hernia is present.     Comments: Incision hernia  Musculoskeletal:     Cervical back: Normal range of motion and neck supple.     Right lower leg: No edema.     Left lower leg: No edema.     Comments: Right ankle brace, chronic pain.   Skin:    General: Skin is warm and dry.     Findings: Erythema present.     Comments: Excoriated buttocks.   Neurological:     General: No focal deficit present.     Mental Status: She is alert. Mental status is at baseline.     Motor: No weakness.     Gait: Gait abnormal.     Comments: Oriented to person and her room on unit.   Psychiatric:        Mood and Affect: Mood normal.        Thought Content: Thought content normal.     Comments: Confused her locations.      Labs reviewed: Recent Labs    11/26/21 0420 01/22/22 0925 04/08/22 0755 08/31/22 1134 10/07/22 0000  NA 135 138 139 136  138 135*  K 3.7 4.5 4.3 3.9  4.3 4.1  CL 104 104 104 104  102 104  CO2 23 25 24* 24 24*  GLUCOSE 109* 125*  --  108*  105*  --   BUN 29* 21 24* 21   26* 15  CREATININE 1.23* 1.23* 1.2* 1.17*  1.30* 1.0  CALCIUM 8.7* 9.3 9.3 8.7* 8.1*   Recent Labs    11/24/21 1023 01/22/22 0925 08/31/22 1134 10/07/22 0000  AST 23 26 27 17   ALT 16 16 19 14   ALKPHOS 67 68 83 80  BILITOT 0.6 0.6 0.7  --   PROT 8.5* 7.1 7.2  --   ALBUMIN 4.2 3.5 3.4* 2.7*   Recent Labs    11/26/21 0420 01/22/22 0925 04/08/22 0755 08/31/22 1134 10/07/22 0000 10/09/22 0000  WBC 8.9 7.8   < > 9.0 7.3 8.5  NEUTROABS  --  6.3   < > 7.0 5,446.00 6,656.00  HGB 12.7 12.6   < > 12.7  13.6 9.6* 11.0*  HCT 40.1 40.6   < > 40.3  40.0 30* 34*  MCV 84.2 84.1  --  85.7  --   --   PLT 202 191   < > 227 315 324   < > = values in this interval not displayed.   Lab Results  Component Value Date   TSH 3.19 03/27/2022   Lab Results  Component Value Date   HGBA1C 5.7 (H) 11/24/2021   Lab Results  Component Value Date   CHOL 181 11/25/2021   HDL 37 (L) 11/25/2021   LDLCALC 127 (H) 11/25/2021   TRIG 85 11/25/2021   CHOLHDL 4.9 11/25/2021    Significant Diagnostic Results in last 30 days:  No results found.  Assessment/Plan: Rectal bleed blood in stool, no nausea, vomiting, constipation, diarrhea, abd pain noted, afebrile. On eliquis for CVA, Pantoprazole for GERD/GI protection.  HPOA agreed to ED eval rather than hold Eliquis, stat labs @ SNF [facility] he resides.   Candidiasis of skin R+L buttock irritated  rash, superficial openings, incontinent of B+B, better.   CHF (congestive heart failure) (HCC) compensated clinically, BLE edema and cough improved, on Furosemide, [facility] CXR CHF mild left effusion. saw Cardiology 10/15/22, no change of medication, placed on Mg, pending BMP/Mg, scheduled echocardiogram  Iron deficiency anemia Hgb 9.6 10/07/22(12.9 07/17/22)<< Hgb 11.0 Iron 27, Vit B12 1179, Folate 6.6 10/09/22, start Fe 325mg  2x/wk, +FOBT x2/3 after Iron started.   Stroke (cerebrum) (HCC) 11/24/21 acute CVA left PCA embolic strokes, no apparent residual symptoms,  on Eliquis f/u Neurology  CKD (chronic kidney disease) stage 3, GFR 30-59 ml/min (HCC) Bun/creat 15/0.8 10/21/22  Hypothyroidism  takes Levothyroxine, TSH 3.19 03/27/22  Hyponatremia Na 133 10/21/22  Atrial flutter (HCC)  f/u Cardiology 10/16/22, off anticoagulation 2/2 falls in the past, restarted Eliquis after acute CVA 11/24/21, heart rate is controlled on Metoprolol  Senile dementia (HCC)  SNF [facility] for supportive care, 01/29/22 MMSE 23/30  Essential hypertension Blood pressure is controlled,  on Metoprolol, off Amlodipine, prn Clonidine  Urinary frequency on Mirabegron, hx of UTI    Family/ staff Communication: plan of care reviewed with the patient and charge nurse.   Labs/tests ordered:  none  Time spend 35 minutes.

## 2022-10-28 NOTE — Assessment & Plan Note (Addendum)
TSH wnl in 03/2022. With atrial fib last month will check TSH  Continue home synthroid daily

## 2022-10-28 NOTE — Assessment & Plan Note (Addendum)
89 year old female presenting from SNF for bright red blood in stool with +fecal occult  -obs to progressive -on eliquis for CVA/atrial fib. Last dose today in the AM. will hold this.  -no active bleed here. Hgb stable -serial cbc q 6 hours. Transfuse if hgb <7.0. Daughter consented to transfusion if needed  -Has had slow drop in hgb since August 2024 when they first noticed bleeding, but no anemia prior to this  -Gi has been consulted -liquid diet for now  -daughter can't remember when last colonoscopy was. NO family hx of colon cancer

## 2022-10-29 ENCOUNTER — Observation Stay (HOSPITAL_COMMUNITY): Payer: Medicare Other

## 2022-10-29 DIAGNOSIS — R1310 Dysphagia, unspecified: Secondary | ICD-10-CM | POA: Diagnosis present

## 2022-10-29 DIAGNOSIS — E871 Hypo-osmolality and hyponatremia: Secondary | ICD-10-CM

## 2022-10-29 DIAGNOSIS — E785 Hyperlipidemia, unspecified: Secondary | ICD-10-CM | POA: Diagnosis present

## 2022-10-29 DIAGNOSIS — I4892 Unspecified atrial flutter: Secondary | ICD-10-CM | POA: Diagnosis present

## 2022-10-29 DIAGNOSIS — C569 Malignant neoplasm of unspecified ovary: Secondary | ICD-10-CM

## 2022-10-29 DIAGNOSIS — R9431 Abnormal electrocardiogram [ECG] [EKG]: Secondary | ICD-10-CM

## 2022-10-29 DIAGNOSIS — I130 Hypertensive heart and chronic kidney disease with heart failure and stage 1 through stage 4 chronic kidney disease, or unspecified chronic kidney disease: Secondary | ICD-10-CM | POA: Diagnosis present

## 2022-10-29 DIAGNOSIS — Z515 Encounter for palliative care: Secondary | ICD-10-CM | POA: Diagnosis not present

## 2022-10-29 DIAGNOSIS — Z8249 Family history of ischemic heart disease and other diseases of the circulatory system: Secondary | ICD-10-CM | POA: Diagnosis not present

## 2022-10-29 DIAGNOSIS — Z7189 Other specified counseling: Secondary | ICD-10-CM | POA: Diagnosis not present

## 2022-10-29 DIAGNOSIS — Z6830 Body mass index (BMI) 30.0-30.9, adult: Secondary | ICD-10-CM | POA: Diagnosis not present

## 2022-10-29 DIAGNOSIS — N1831 Chronic kidney disease, stage 3a: Secondary | ICD-10-CM

## 2022-10-29 DIAGNOSIS — I10 Essential (primary) hypertension: Secondary | ICD-10-CM

## 2022-10-29 DIAGNOSIS — K219 Gastro-esophageal reflux disease without esophagitis: Secondary | ICD-10-CM | POA: Diagnosis present

## 2022-10-29 DIAGNOSIS — I4891 Unspecified atrial fibrillation: Secondary | ICD-10-CM

## 2022-10-29 DIAGNOSIS — K921 Melena: Secondary | ICD-10-CM | POA: Diagnosis present

## 2022-10-29 DIAGNOSIS — Z79890 Hormone replacement therapy (postmenopausal): Secondary | ICD-10-CM | POA: Diagnosis not present

## 2022-10-29 DIAGNOSIS — Z8616 Personal history of COVID-19: Secondary | ICD-10-CM | POA: Diagnosis not present

## 2022-10-29 DIAGNOSIS — R627 Adult failure to thrive: Secondary | ICD-10-CM | POA: Diagnosis present

## 2022-10-29 DIAGNOSIS — Z7401 Bed confinement status: Secondary | ICD-10-CM | POA: Diagnosis not present

## 2022-10-29 DIAGNOSIS — E039 Hypothyroidism, unspecified: Secondary | ICD-10-CM | POA: Diagnosis present

## 2022-10-29 DIAGNOSIS — Z87891 Personal history of nicotine dependence: Secondary | ICD-10-CM | POA: Diagnosis not present

## 2022-10-29 DIAGNOSIS — Z8673 Personal history of transient ischemic attack (TIA), and cerebral infarction without residual deficits: Secondary | ICD-10-CM

## 2022-10-29 DIAGNOSIS — K922 Gastrointestinal hemorrhage, unspecified: Secondary | ICD-10-CM | POA: Diagnosis present

## 2022-10-29 DIAGNOSIS — E669 Obesity, unspecified: Secondary | ICD-10-CM | POA: Diagnosis present

## 2022-10-29 DIAGNOSIS — F0390 Unspecified dementia without behavioral disturbance: Secondary | ICD-10-CM | POA: Diagnosis present

## 2022-10-29 DIAGNOSIS — M810 Age-related osteoporosis without current pathological fracture: Secondary | ICD-10-CM | POA: Diagnosis present

## 2022-10-29 DIAGNOSIS — Z8052 Family history of malignant neoplasm of bladder: Secondary | ICD-10-CM | POA: Diagnosis not present

## 2022-10-29 DIAGNOSIS — Z7901 Long term (current) use of anticoagulants: Secondary | ICD-10-CM | POA: Diagnosis not present

## 2022-10-29 DIAGNOSIS — I5032 Chronic diastolic (congestive) heart failure: Secondary | ICD-10-CM | POA: Diagnosis present

## 2022-10-29 LAB — BASIC METABOLIC PANEL WITH GFR
Anion gap: 10 (ref 5–15)
BUN: 15 mg/dL (ref 8–23)
CO2: 22 mmol/L (ref 22–32)
Calcium: 8.7 mg/dL — ABNORMAL LOW (ref 8.9–10.3)
Chloride: 101 mmol/L (ref 98–111)
Creatinine, Ser: 0.92 mg/dL (ref 0.44–1.00)
GFR, Estimated: 59 mL/min — ABNORMAL LOW (ref >60)
Glucose, Bld: 108 mg/dL — ABNORMAL HIGH (ref 70–99)
Potassium: 4 mmol/L (ref 3.5–5.1)
Sodium: 133 mmol/L — ABNORMAL LOW (ref 135–145)

## 2022-10-29 MED ORDER — MAGNESIUM OXIDE -MG SUPPLEMENT 400 (240 MG) MG PO TABS
400.0000 mg | ORAL_TABLET | Freq: Every day | ORAL | Status: DC
  Administered 2022-10-29 – 2022-10-30 (×2): 400 mg via ORAL
  Filled 2022-10-29 (×2): qty 1

## 2022-10-29 NOTE — Plan of Care (Signed)
Problem: Education: Goal: Knowledge of disease or condition will improve Outcome: Progressing Goal: Knowledge of secondary prevention will improve (MUST DOCUMENT ALL) Outcome: Progressing Goal: Knowledge of patient specific risk factors will improve [name] N/A or DELETE if not current risk factor) Outcome: Progressing   Problem: Ischemic Stroke/TIA Tissue Perfusion: Goal: Complications of ischemic stroke/TIA will be minimized Outcome: Progressing   Problem: Coping: Goal: Will verbalize positive feelings about self Outcome: Progressing Goal: Will identify appropriate support needs Outcome: Progressing   Problem: Health Behavior/Discharge Planning: Goal: Ability to manage health-related needs will improve Outcome: Progressing Goal: Goals will be collaboratively established with patient/family Outcome: Progressing   Problem: Self-Care: Goal: Ability to participate in self-care as condition permits will improve Outcome: Progressing Goal: Verbalization of feelings and concerns over difficulty with self-care will improve Outcome: Progressing Goal: Ability to communicate needs accurately will improve Outcome: Progressing   Problem: Nutrition: Goal: Risk of aspiration will decrease Outcome: Progressing Goal: Dietary intake will improve Outcome: Progressing

## 2022-10-29 NOTE — TOC Transition Note (Signed)
Transition of Care [facility]) - CM/SW Discharge Note   Patient Details  Name: [name] MRN: [id number] Date of Birth: 07-20-1929  [facility] CM/SW Contact:  Eduard Roux, LCSW Phone Number: 10/29/2022, 10:43 AM   Clinical Narrative:     CW spoke with patient's daughter by phone. CSW introduced self and explained role. Family confirmed patient is from [facility] /SNF. She reports patient has not eaten in the past two weeks. She states, she believes the patient's health has declined. She requested to meet with palliative care for goals of care.   MD - informed by secured chat.   TOC will continue to follow and assist with discharge planning.   Antony Blackbird, MSW, LCSW Clinical Social Worker      Barriers to Discharge: Continued Medical Work up   Patient Goals and CMS Choice      Discharge Placement                         Discharge Plan and Services Additional resources added to the After Visit Summary for                                       Social Determinants of Health (SDOH) Interventions SDOH Screenings   Food Insecurity: No Food Insecurity (10/28/2022)  Housing: Low Risk  (10/28/2022)  Transportation Needs: No Transportation Needs (10/28/2022)  Utilities: Not At Risk (10/28/2022)  Alcohol Screen: Low Risk  (08/23/2021)  Depression (PHQ2-9): Low Risk  (10/01/2022)  Financial Resource Strain: Low Risk  (08/23/2021)  Physical Activity: Insufficiently Active (08/23/2021)  Social Connections: Socially Isolated (08/23/2021)  Stress: No Stress Concern Present (08/16/2020)  Tobacco Use: Medium Risk (10/28/2022)     Readmission Risk Interventions     No data to display

## 2022-10-29 NOTE — Progress Notes (Signed)
PROGRESS NOTE    TEA KUNG  WUJ:[id number] DOB: [birth year] DOA: 10/28/2022 PCP: Mast, Man X, NP    Brief Narrative:  [name] is a 89 y.o. female with past medical history significant of GERD, HTN, hyperlipidemia hx of ovarian cancer, hx of CVA in 10/23, CKD stage 3, atrial flutter on eliquis, hypothyroidism, dementia, presented to the hospital with bright red blood in her stools.  History of COVID, CHF exacerbation, A-fib and UTI in the past.  Plan was to get echo as outpatient from her facility.  On this admission patient was afebrile.  Initial hemoglobin was 10.7, sodium: 132, creatinine: 1.18, INR: 1.9, fecal occult positive.  In the ED, type and screen was done and GI was consulted.  Patient was then admitted hospital for further evaluation and treatment   Assessment and plan.    GI bleed. Bright red blood.  Fecal occult positive.  Was on Eliquis for CVA atrial fibrillation.  Currently on hold.  Transfuse for hemoglobin less than 7.  GI has been consulted.  On liquid diet.   Dysphagia Has some coughing spells after taking pills.  Check speech therapy.  Aspiration precautions.   Failure to thrive in adult Now bedbound with decreased oral intake mostly over a week.  Likely post-COVID versus worsening dementia.  Nutrition consult.  Palliative care consulted.   Prolonged QT interval Optimize electrolytes.  Closely monitor.   Paroxysmal atrial flutter  Will get repeat echocardiogram.  Currently normal sinus rhythm.  Holding Eliquis.  Continue metoprolol.   History of CVA (cerebrovascular accident) Continue lipitor, hold eliquis    Hypothyroidism On Synthroid.  Will continue.  TSH within normal range.   Hyponatremia Sodium at 132 on presentation.  Check BMP in AM.   Senile dementia Delirium precautions , Fall precautions.  Will closely monitor.   Ovarian cancer  Stage 3, diagnosed in 2012.  Status post surgery and currently in remission.  GERD Continue PPI     CKD (chronic kidney disease) stage 3, GFR 30-59 ml/min  Baseline creatinine of 1.2.  Currently at baseline.   HLD (hyperlipidemia) Continue lipitor   Goals of care.  I had a prolonged discussion with the patient's daughter on the phone.  She does not want her mother to undergo any aggressive care including colonoscopy.  She wishes to advance diet at this time.  Will advance to full liquids.  Will get a speech therapy to see as we advance.  Will follow the patient with serial hemoglobin while in the hospital overnight.    DVT prophylaxis: SCDs Start: 10/28/22 1536   Code Status:     Code Status: Full Code  Disposition: Likely home in 1-2 days.  Status is: Observation  The patient will require care spanning > 2 midnights and should be moved to inpatient because: GI bleed, need for closer observation,   Family Communication:  Spoke with the patient's daughter on the phone and updated her about the clinical condition of the patient.  Consultants:  GI Palliative care  Procedures:  None  Antimicrobials:  None  Anti-infectives (From admission, onward)    None      Subjective: Today, patient was seen and examined at bedside.  Denies any nausea, vomiting, fever, chills or rigor.  Has underlying dementia. No further blood per rectum.  Objective: Vitals:   10/28/22 2040 10/28/22 2121 10/28/22 2345 10/29/22 0724  BP: (!) 130/52 (!) 125/53  (!) 150/72  Pulse: 86 88  80  Resp: 20  18  Temp: 97.6 F (36.4 C)  98 F (36.7 C) 98.1 F (36.7 C)  TempSrc: Oral  Oral Oral  SpO2: 97%   97%  Weight:      Height:        Intake/Output Summary (Last 24 hours) at 10/29/2022 1258 Last data filed at 10/29/2022 1000 Gross per 24 hour  Intake 363 ml  Output --  Net 363 ml   Filed Weights   10/28/22 1225  Weight: 80.6 kg    Physical Examination: Body mass index is 30.49 kg/m.   General:  obese built, not in obvious distress HENT:   Pallor noted.  Oral mucosa is moist.   Chest:   Diminished breath sounds bilaterally. No crackles or wheezes.  CVS: S1 &S2 heard. No murmur.  Regular rate and rhythm. Abdomen: Soft, nontender, nondistended.  Bowel sounds are heard.   Extremities: No cyanosis, clubbing or edema.  Peripheral pulses are palpable. Psych: Alert, awake and has dementia. CNS:  No cranial nerve deficits.  Power equal in all extremities.   Skin: Warm and dry.  No rashes noted.  Data Reviewed:   CBC: Recent Labs  Lab 10/28/22 1227 10/29/22 0048 10/29/22 0528  WBC 10.0 8.9 8.9  NEUTROABS 7.7  --   --   HGB 10.7* 9.6* 10.7*  HCT 33.3* 29.7* 35.2*  MCV 81.8 81.8 83.4  PLT 434* 409* 427*    Basic Metabolic Panel: Recent Labs  Lab 10/28/22 1227 10/29/22 1004  NA 132* 133*  K 4.4 4.0  CL 95* 101  CO2 25 22  GLUCOSE 131* 108*  BUN 17 15  CREATININE 1.18* 0.92  CALCIUM 8.7* 8.7*  MG 1.7  --     Liver Function Tests: Recent Labs  Lab 10/28/22 1227  AST 30  ALT 22  ALKPHOS 79  BILITOT 0.5  PROT 6.7  ALBUMIN 2.4*     Radiology Studies: DG CHEST PORT 1 VIEW  Result Date: 10/28/2022 CLINICAL DATA:  Acute exacerbation of CHF.  Shortness of breath. EXAM: PORTABLE CHEST 1 VIEW COMPARISON:  01/22/2022 FINDINGS: Lung volumes are low. Grossly stable heart size, left heart border partially obscured by pleural effusion and opacity. Aortic atherosclerosis. Opacification of the lower left hemithorax likely related to tracking pleural effusion and atelectasis. Slight vascular congestion. No pneumothorax. IMPRESSION: 1. Low lung volumes. Opacification of the lower left hemithorax likely related to tracking pleural effusion and atelectasis. 2. Slight vascular congestion. Electronically Signed   By: Narda Rutherford M.D.   On: 10/28/2022 16:34      LOS: 0 days    Joycelyn Das, MD [facility] Available via Epic secure chat 7am-7pm After these hours, please refer to coverage provider listed on [email or web address] 10/29/2022, 12:58 PM

## 2022-10-29 NOTE — Progress Notes (Signed)
Mobility Specialist Progress Note:   10/29/22 1208  Mobility  Activity Dangled on edge of bed  Level of Assistance Moderate assist, patient does 50-74%  Activity Response Tolerated fair  Mobility Referral Yes  $Mobility charge 1 Mobility  Mobility Specialist Start Time (ACUTE ONLY) 1158  Mobility Specialist Stop Time (ACUTE ONLY) 1207  Mobility Specialist Time Calculation (min) (ACUTE ONLY) 9 min   Pre Mobility: 66 HR  During Mobility: 74 HR   Pt received in bed, agreeable to mobility. ModA bed mobility d/t anxiety and confusion. Pt declining STS requesting to lay back down d/t fear of falling. Pt left in supine position with RN present in room.   Leory Plowman  Mobility Specialist Please contact via [employer] office at [phone]

## 2022-10-29 NOTE — Progress Notes (Signed)
Initial Nutrition Assessment  DOCUMENTATION CODES:   Not applicable  INTERVENTION:  Continue diet order per SLP Encouraged adequate PO intake Ensure Enlive po BID, each supplement provides 350 kcal and 20 grams of protein. Magic cup TID with meals, each supplement provides 290 kcal and 9 grams of protein If PO intake remains limited and aligns with GOC, recommend Cortrak placement for supplemental nutrition support  NUTRITION DIAGNOSIS:   Inadequate oral intake related to acute illness (failure to thrive) as evidenced by per patient/family report.  GOAL:   Patient will meet greater than or equal to 90% of their needs   MONITOR:   PO intake, Supplement acceptance, Labs, Weight trends  REASON FOR ASSESSMENT:   Consult Assessment of nutrition requirement/status, Poor PO  ASSESSMENT:   Pt admitted with bloody stool d/t lower GI bleed. PMH significant for GERD, Htn, HLD, ovarian cancer, CVA, CKD stage 3, aflutter, hypothyroidism, dementia.  9/18: s/p BSE with SLP, recommend dysphagia 3/thin liquids  Unfortunately, pt unavailable at time of visit x2 attempts for family meeting with PMT. Per MD note, pt's daughter does not wish for aggressive care.   Spoke with RN who reports pt consumed 3 bowls of chicken broth this morning. She does not particularly enjoy jello. No ensure supplements have been provided d/t having been on a clear liquid diet this morning and then pt refusal.   Per review of chart, pt's daughter reports since having COVID in August, she has been declining. She has not bene eating much with noted weight loss and sleeping more over the last few weeks.   Meal completions: 09/18: 100% (clear liquid diet)  Reviewed weight history on file. Pt's weight has gradually been declining over the last year. Noted a weight loss of 11.1% since 11/26/21. Will continue to monitor throughout admission.   Medications: colace, ferrous sulfate, lasix, mag-ox, protonix, miralax,  klor-con, sodium chloride  Labs: sodium 133, GFR 59  NUTRITION - FOCUSED PHYSICAL EXAM: Deferred to follow up.   Diet Order:   Diet Order             DIET DYS 3 Room service appropriate? Yes with Assist; Fluid consistency: Thin  Diet effective now                   EDUCATION NEEDS:   No education needs have been identified at this time  Skin:  Skin Assessment: Reviewed RN Assessment  Last BM:  9/17 (type 4 large)  Height:   Ht Readings from Last 1 Encounters:  10/28/22 5\' 4"  (1.626 m)    Weight:   Wt Readings from Last 1 Encounters:  10/28/22 80.6 kg    Ideal Body Weight:  54.5 kg  BMI:  Body mass index is 30.49 kg/m.  Estimated Nutritional Needs:   Kcal:  1300-1500  Protein:  65-80g  Fluid:  1.3-1.5L  Drusilla Kanner, RDN, LDN Clinical Nutrition

## 2022-10-29 NOTE — Evaluation (Signed)
Clinical/Bedside Swallow Evaluation Patient Details  Name: [name] MRN: [id number] Date of Birth: 26-Jan-1930  Today's Date: 10/29/2022 Time: SLP Start Time (ACUTE ONLY): 1402 SLP Stop Time (ACUTE ONLY): 1414 SLP Time Calculation (min) (ACUTE ONLY): 12 min  Past Medical History:  Past Medical History:  Diagnosis Date   Anemia associated with acute blood loss 04/03/2011   Arthritis    knees    Ascites    Blood transfusion    hx of 2011    Complication of anesthesia    Sodium drops per pt    Cystitis    DIARRHEA, ANTIBIOTIC ASSOCIATED 05/31/2009   Qualifier: Diagnosis of  By: Daiva Eves MD, Cornelius     GERD (gastroesophageal reflux disease)    H/O hiatal hernia    Hyperlipidemia    Hypertension    Hyponatremia    Neutropenia with fever (HCC) 05/18/2011   OSTEOMYELITIS, CHRONIC, LOWER LEG 04/23/2009   Qualifier: Diagnosis of  By: Ninetta Lights MD, Linus Mako    OSTEOPOROSIS 04/23/2009   Qualifier: Diagnosis of  By: Ninetta Lights MD, Jeffrey     Ovarian cancer (HCC) 01/25/2011   Pneumonia    hx of    Postmenopausal atrophic vaginitis    PROSTHETIC JOINT COMPLICATION 05/28/2009   Qualifier: Diagnosis of  By: Daiva Eves MD, Cornelius     Renal disorder    Decreased kidney function   Staph infection 2010   after knee replacement   Urinary frequency    Vulvitis    Past Surgical History:  Past Surgical History:  Procedure Laterality Date   ABDOMINAL HYSTERECTOMY  04/01/2011   Procedure: HYSTERECTOMY ABDOMINAL;  Surgeon: Jeannette Corpus, MD;  Location: [facility];  Service: Gynecology;  Laterality: N/A;   APPENDECTOMY     JOINT REPLACEMENT     R knee in 2008, 5 operations on L knee   LAPAROTOMY  04/01/2011   Procedure: EXPLORATORY LAPAROTOMY;  Surgeon: Jeannette Corpus, MD;  Location: [facility];  Service: Gynecology;  Laterality: N/A;   OTHER SURGICAL HISTORY     hx of C section 1966   SALPINGOOPHORECTOMY  04/01/2011   Procedure: SALPINGO OOPHERECTOMY;  Surgeon:  Jeannette Corpus, MD;  Location: [facility];  Service: Gynecology;  Laterality: Bilateral;   TONSILLECTOMY     HPI:  [name] is a 89 yo female presenting to ED 9/17 from SNF with bright red blood in stool. CXR with low lung volumes and opacification of lower L hemithorax related to pleural effusion or atelectasis. Per note, following CVA pt able to state her name and birthday, has been sleeping more, reduced PO intake with weight loss, and requires full assist with ADLs. Most recently seen by SLP 04/18/20 with a functional appearing oropharyngeal swallow and was recommended a regular diet with thin liquids (although trace aspiration was noted on esophogram, necessitating SLP consult at that time). PMH includes GERD, HTN, HLD, history of ovarian cancer, prior CVA 11/2021, CKD III, atrial flutter on Eliquis, hypothyroidism, memory loss    Assessment / Plan / Recommendation  Clinical Impression  Pt reports no perceived difficutly swallowing. Per pt's daughter, she typically consumes a soft solid diet while emphasizing desired foods (snacks, sodas, etc) to increase intake. Oral motor exam WFL. Pt with noted throat clearing prior to any PO trials as well as delayed throat clearing occasionally following trials of thin liquids. Question direct correlation to swallowing. Purees and solids overall WFL. Discussed diet modification options with pt's daughter, who  states that she wants the pt to return to a solid diet that most closely resembles her baseline. Recommend initiating a diet of Dys 3 textures with thin liquids. No further SLP f/u needed at this time. Will s/o. SLP Visit Diagnosis: Dysphagia, unspecified (R13.10)    Aspiration Risk  Mild aspiration risk    Diet Recommendation Dysphagia 3 (Mech soft);Thin liquid    Liquid Administration via: Cup;Straw Medication Administration: Whole meds with puree Supervision: Patient able to self feed;Full supervision/cueing for compensatory  strategies Compensations: Minimize environmental distractions;Slow rate;Small sips/bites Postural Changes: Seated upright at 90 degrees;Remain upright for at least 30 minutes after po intake    Other  Recommendations Oral Care Recommendations: Oral care BID    Recommendations for follow up therapy are one component of a multi-disciplinary discharge planning process, led by the attending physician.  Recommendations may be updated based on patient status, additional functional criteria and insurance authorization.  Follow up Recommendations No SLP follow up      Assistance Recommended at Discharge    Functional Status Assessment Patient has not had a recent decline in their functional status  Frequency and Duration            Prognosis Prognosis for improved oropharyngeal function: Good Barriers to Reach Goals: Cognitive deficits      Swallow Study   General HPI: [name] is a 89 yo female presenting to ED 9/17 from SNF with bright red blood in stool. CXR with low lung volumes and opacification of lower L hemithorax related to pleural effusion or atelectasis. Per note, following CVA pt able to state her name and birthday, has been sleeping more, reduced PO intake with weight loss, and requires full assist with ADLs. Most recently seen by SLP 04/18/20 with a functional appearing oropharyngeal swallow and was recommended a regular diet with thin liquids (although trace aspiration was noted on esophogram, necessitating SLP consult at that time). PMH includes GERD, HTN, HLD, history of ovarian cancer, prior CVA 11/2021, CKD III, atrial flutter on Eliquis, hypothyroidism, memory loss Type of Study: Bedside Swallow Evaluation Previous Swallow Assessment: see HPI Diet Prior to this Study: Full liquid diet Temperature Spikes Noted: No Respiratory Status: Room air History of Recent Intubation: No Behavior/Cognition: Alert;Cooperative;Pleasant mood;Confused Oral Cavity Assessment: Within  Functional Limits Oral Care Completed by SLP: No Oral Cavity - Dentition: Adequate natural dentition Vision: Functional for self-feeding Self-Feeding Abilities: Able to feed self Patient Positioning: Upright in bed Baseline Vocal Quality: Normal Volitional Cough: Strong Volitional Swallow: Able to elicit    Oral/Motor/Sensory Function Overall Oral Motor/Sensory Function: Within functional limits   Ice Chips Ice chips: Not tested   Thin Liquid Thin Liquid: Impaired Presentation: Straw;Self Fed Pharyngeal  Phase Impairments: Throat Clearing - Delayed    Nectar Thick Nectar Thick Liquid: Not tested   Honey Thick Honey Thick Liquid: Not tested   Puree Puree: Within functional limits Presentation: Spoon;Self Fed   Solid     Solid: Within functional limits Presentation: Self Fed      Gwynneth Aliment, M.A., CF-SLP Speech Language Pathology, Acute Rehabilitation Services  Secure Chat preferred [phone]  10/29/2022,2:44 PM

## 2022-10-29 NOTE — Consult Note (Signed)
Consultation Note Date: 10/29/2022   Patient Name: [name]  DOB: 1929-05-05  MRN: [id number]  Age / Sex: 89 y.o., female  PCP: Mast, Man X, NP Referring Physician: Joycelyn Das, MD  Reason for Consultation: Establishing goals of care  HPI/Patient Profile: 89 y.o. female   admitted on 10/28/2022 with past    medical history significant of GERD, HTN, HLD, hx of ovarian cancer, hx of CVA in 10/23, CKD stage 3, atrial flutter on eliquis, hypothyroidism, memory loss who presented to ED with concerns for bright red blood in her stool.   A few weeks ago they saw that she had blood in her stool. Patient lives at [facility]/skilled  Her hgb was checked and it was down some, but repeat it was better. Today when she was getting changed she had quite a bit of blood in her diaper and smeared on her stomach.   To ED, admitted for treatment and stabilization.  She was hypoxic, CHF exacerbation.    Per daughter today h/o  Covid/August 2024. Dramatic decline since;  UTI, Poor po intake, decrease physical and functional status.  Family face treatment option decisions, advanced d directives decisions and anticipatory care needs  Clinical Assessment and Goals of Care:   This NP Lorinda Creed reviewed medical records, received report from team, assessed the patient and then meet at the patient's bedside along with her daughter/ HPOA [name] to discuss diagnosis, prognosis, GOC, EOL wishes disposition and options.  Patient is hard of hearing and pleasantly confused, she does not have medical decision-making capacity at this time.  Family meeting held outside patient's room   Concept of Palliative Care was introduced as specialized medical care for people and their families living with serious illness.  If focuses on providing relief from the symptoms and stress of a serious illness.  The goal is to improve  quality of life for both the patient and the family.  Values and goals of care important to patient and family were attempted to be elicited.  Created space and opportunity for daughter to explore thoughts and feelings regarding current medical situation.  Daughter is tearful as she shares that she is seeing her mom significantly decline over the past few weeks.  She is sleeping more and more, is less mobile and her oral intake is greatly decreased.  She is very torn and undecided on health care decisions moving forward  She worried that this new episode of bloody stool and hospitalization meant that she was actively dying.  However today she see her as much improves and this is confusing to her.  Detailed education regarding decision regarding GI workup at this time, risks and benefits.  Education offered on concept of human mortality and overall failure to thrive, often reflected in a slow decline.  Education offered on the natural trajectory and expectations at end-of-life.     A  discussion was had today regarding advanced directives.  Concepts specific to code status, artifical feeding and hydration, continued IV antibiotics and rehospitalization  was had.    The difference between a aggressive medical intervention path  and a palliative comfort care path for this patient at this time was had.     Education offered on hospice benefit; philosophy and eligibility.   MOST form introduced and left for review, a Hard Choices booklet was left for review    Questions and concerns addressed.  [name] wishes to process today's discussion, attempt to discuss with her brother (sometimes he is unavailable) and speak tomorrow with a PMT provider.  I will be out of the hospital and will have another provider reach out to her tomorrow   Daughter encouraged to call with questions or concerns.     PMT will continue to support holistically.            HCPOA/[name]    SUMMARY OF  RECOMMENDATIONS    Code Status/Advance Care Planning: Full code Educated patient/family to consider DNR/DNI status understanding evidenced based poor outcomes in similar hospitalized patient, as the cause of arrest is likely associated with advanced chronic illness rather than an easily reversible acute cardio-pulmonary event.    Palliative Prophylaxis:  Aspiration, Bowel Regimen, Delirium Protocol, Frequent Pain Assessment, and Oral Care  Additional Recommendations (Limitations, Scope, Preferences): Full medical support  Psycho-social/Spiritual:  Desire for further Chaplaincy support: yes  Additional Recommendations: Education on Hospice  Prognosis:  Unable to determine  Discharge Planning:  to be determined      Primary Diagnoses: Present on Admission:  Atrial flutter (HCC)  CKD (chronic kidney disease) stage 3, GFR 30-59 ml/min (HCC)  Essential hypertension  GERD  HLD (hyperlipidemia)  Hypothyroidism  Hyponatremia  Ovarian cancer (HCC)  Senile dementia (HCC)  Lower GI bleed   I have reviewed the medical record, interviewed the patient and family, and examined the patient. The following aspects are pertinent.  Past Medical History:  Diagnosis Date   Anemia associated with acute blood loss 04/03/2011   Arthritis    knees    Ascites    Blood transfusion    hx of 2011    Complication of anesthesia    Sodium drops per pt    Cystitis    DIARRHEA, ANTIBIOTIC ASSOCIATED 05/31/2009   Qualifier: Diagnosis of  By: Daiva Eves MD, Cornelius     GERD (gastroesophageal reflux disease)    H/O hiatal hernia    Hyperlipidemia    Hypertension    Hyponatremia    Neutropenia with fever (HCC) 05/18/2011   OSTEOMYELITIS, CHRONIC, LOWER LEG 04/23/2009   Qualifier: Diagnosis of  By: Ninetta Lights MD, Linus Mako    OSTEOPOROSIS 04/23/2009   Qualifier: Diagnosis of  By: Ninetta Lights MD, Jeffrey     Ovarian cancer (HCC) 01/25/2011   Pneumonia    hx of    Postmenopausal atrophic  vaginitis    PROSTHETIC JOINT COMPLICATION 05/28/2009   Qualifier: Diagnosis of  By: Daiva Eves MD, Cornelius     Renal disorder    Decreased kidney function   Staph infection 2010   after knee replacement   Urinary frequency    Vulvitis    Social History   Socioeconomic History   Marital status: Widowed    Spouse name: Not on file   Number of children: Not on file   Years of education: Not on file   Highest education level: Not on file  Occupational History   Not on file  Tobacco Use   Smoking status: Former    Current packs/day: 0.00  Types: Cigarettes    Quit date: 02/11/1952    Years since quitting: 70.7   Smokeless tobacco: Never  Vaping Use   Vaping status: Never Used  Substance and Sexual Activity   Alcohol use: Not Currently   Drug use: Never   Sexual activity: Never  Other Topics Concern   Not on file  Social History Narrative   Not on file   Social Determinants of Health   Financial Resource Strain: Low Risk  (08/23/2021)   Overall Financial Resource Strain (CARDIA)    Difficulty of Paying Living Expenses: Not hard at all  Food Insecurity: No Food Insecurity (10/28/2022)   Hunger Vital Sign    Worried About Running Out of Food in the Last Year: Never true    Ran Out of Food in the Last Year: Never true  Transportation Needs: No Transportation Needs (10/28/2022)   PRAPARE - Administrator, Civil Service (Medical): No    Lack of Transportation (Non-Medical): No  Physical Activity: Insufficiently Active (08/23/2021)   Exercise Vital Sign    Days of Exercise per Week: 7 days    Minutes of Exercise per Session: 10 min  Stress: No Stress Concern Present (08/16/2020)   [employer] of Occupational Health - Occupational Stress Questionnaire    Feeling of Stress : Only a little  Social Connections: Socially Isolated (08/23/2021)   Social Connection and Isolation Panel [NHANES]    Frequency of Communication with Friends and Family: More than three  times a week    Frequency of Social Gatherings with Friends and Family: Three times a week    Attends Religious Services: Never    Active Member of Clubs or Organizations: No    Attends Banker Meetings: Never    Marital Status: Widowed   Family History  Problem Relation Age of Onset   Cancer Other        Bladder cancer   Heart attack Brother    Diabetes Brother    Scheduled Meds:  atorvastatin  40 mg Oral Daily   docusate sodium  100 mg Oral Daily   feeding supplement  237 mL Oral BID BM   [START ON 10/30/2022] ferrous sulfate  325 mg Oral Once per day on Monday Thursday   furosemide  10 mg Oral Daily   latanoprost  1 drop Both Eyes QHS   levothyroxine  25 mcg Oral QAC breakfast   magnesium oxide  400 mg Oral Daily   metoprolol tartrate  25 mg Oral BID   pantoprazole  40 mg Oral Daily   [START ON 10/30/2022] polyethylene glycol  17 g Oral QODAY   potassium chloride  10 mEq Oral Daily   sodium chloride flush  3 mL Intravenous Q12H   sodium chloride  1 g Oral BID   Continuous Infusions:  sodium chloride     PRN Meds:.sodium chloride, acetaminophen **OR** acetaminophen, polyvinyl alcohol, sodium chloride flush Medications Prior to Admission:  Prior to Admission medications   Medication Sig Start Date End Date Taking? Authorizing Provider  acetaminophen (TYLENOL) 325 MG tablet Take 650 mg by mouth every 4 (four) hours as needed for moderate pain or fever.   Yes [provider]  ALPRAZolam (XANAX) 0.5 MG tablet Take 0.5 mg by mouth every 12 (twelve) hours.   Yes [provider]  apixaban (ELIQUIS) 5 MG TABS tablet Take 1 tablet (5 mg total) by mouth 2 (two) times daily. 11/29/21  Yes [name], MD  atorvastatin (LIPITOR) 40  MG tablet Take 1 tablet (40 mg total) by mouth daily. 11/28/21  Yes [name], MD  B Complex-C-Folic Acid (B-COMPLEX/FOLIC ACID/VITAMIN C PO) Take 1 tablet by mouth daily with breakfast.   Yes [provider]  bimatoprost  (LUMIGAN) 0.03 % ophthalmic solution Place 1 drop into both eyes at bedtime.   Yes [provider]  Cholecalciferol (VITAMIN D3) 50 MCG (2000 UT) TABS Take 2,000 Units by mouth in the morning.   Yes [provider]  cloNIDine (CATAPRES) 0.1 MG tablet Take 0.1 mg by mouth daily as needed (hypertension).   Yes [provider]  docusate sodium (COLACE) 100 MG capsule Take 100 mg by mouth daily.   Yes [provider]  ferrous sulfate 325 (65 FE) MG EC tablet Take 325 mg by mouth 2 (two) times a week. Mon, Thrusday   Yes [provider]  furosemide (LASIX) 20 MG tablet Take 10 mg by mouth daily.   Yes [provider]  lactose free nutrition (BOOST PLUS) LIQD Take 1 Container by mouth 3 (three) times daily.   Yes [provider]  levothyroxine (SYNTHROID) 25 MCG tablet Take 25 mcg by mouth daily before breakfast.   Yes [provider]  Magnesium Oxide 400 MG CAPS Take 400 mg daily 10/23/22  Yes [name], MD  metoprolol tartrate (LOPRESSOR) 25 MG tablet Take 25 mg by mouth 2 (two) times daily.   Yes [provider]  mirabegron ER (MYRBETRIQ) 25 MG TB24 tablet Take 25 mg by mouth daily.   Yes [provider]  nystatin (MYCOSTATIN/NYSTOP) powder Apply 1 application  topically 2 (two) times daily as needed (reason not listed).   Yes [provider]  OXYGEN Inhale 2 L into the lungs as needed (if spo2 < 90 %).   Yes [provider]  pantoprazole (PROTONIX) 40 MG tablet Take 40 mg by mouth daily.   Yes [provider]  polyethylene glycol (MIRALAX / GLYCOLAX) 17 g packet Take 17 g by mouth every other day.   Yes [provider]  polyvinyl alcohol (LIQUIFILM TEARS) 1.4 % ophthalmic solution Place 2 drops into both eyes in the morning, at noon, in the evening, and at bedtime.   Yes [provider]  potassium chloride (KLOR-CON) 10 MEQ tablet Take 10 mEq by mouth  daily.   Yes [provider]  sodium chloride 1 g tablet Take 1 g by mouth 2 (two) times daily.   Yes [provider]   Allergies  Allergen Reactions   Morphine And Codeine Anaphylaxis and Other (See Comments)    Given after knee replacement and code blue occurred   Morphine Sulfate Anaphylaxis   Penicillins Hives, Itching and Other (See Comments)    Syncope, also   Nsaids Other (See Comments)    Because of an abnormal kidney test result   Penicillin V Hives   Shellfish Allergy Nausea And Vomiting and Other (See Comments)    Pt has shellfish allergy only.  Has had IV contrast x 2 and did fine.   Shellfish-Derived Products Nausea And Vomiting   Review of Systems  Unable to perform ROS: Mental status change    Physical Exam Cardiovascular:     Rate and Rhythm: Normal rate.  Pulmonary:     Effort: Pulmonary effort is normal.  Skin:    General: Skin is warm and dry.  Neurological:     Mental Status: She is alert.  Psychiatric:  Cognition and Memory: Cognition is impaired.     Vital Signs: BP (!) 150/72 (BP Location: Left Arm)   Pulse 80   Temp 98.1 F (36.7 C) (Oral)   Resp 18   Ht 5\' 4"  (1.626 m)   Wt 80.6 kg   SpO2 97%   BMI 30.49 kg/m  Pain Scale: 0-10   Pain Score: 0-No pain   SpO2: SpO2: 97 % O2 Device:SpO2: 97 % O2 Flow Rate: .   IO: Intake/output summary:  Intake/Output Summary (Last 24 hours) at 10/29/2022 0747 Last data filed at 10/28/2022 1617 Gross per 24 hour  Intake 3 ml  Output --  Net 3 ml    LBM: Last BM Date : 10/28/22 Baseline Weight: Weight: 80.6 kg Most recent weight: Weight: 80.6 kg     Palliative Assessment/Data: 40 %     Time 90 minutes   Signed by: Lorinda Creed, NP   Please contact Palliative Medicine Team phone at [phone] for questions and concerns.  For individual provider: See Loretha Stapler

## 2022-10-30 DIAGNOSIS — F0390 Unspecified dementia without behavioral disturbance: Secondary | ICD-10-CM | POA: Diagnosis not present

## 2022-10-30 DIAGNOSIS — Z7189 Other specified counseling: Secondary | ICD-10-CM | POA: Diagnosis not present

## 2022-10-30 DIAGNOSIS — K922 Gastrointestinal hemorrhage, unspecified: Secondary | ICD-10-CM | POA: Diagnosis not present

## 2022-10-30 DIAGNOSIS — R1310 Dysphagia, unspecified: Secondary | ICD-10-CM | POA: Diagnosis not present

## 2022-10-30 DIAGNOSIS — R627 Adult failure to thrive: Secondary | ICD-10-CM | POA: Diagnosis not present

## 2022-10-30 DIAGNOSIS — R9431 Abnormal electrocardiogram [ECG] [EKG]: Secondary | ICD-10-CM | POA: Diagnosis not present

## 2022-10-30 LAB — CBC
HCT: 34.6 % — ABNORMAL LOW (ref 36.0–46.0)
Hemoglobin: 10.8 g/dL — ABNORMAL LOW (ref 12.0–15.0)
MCH: 25.2 pg — ABNORMAL LOW (ref 26.0–34.0)
MCHC: 31.2 g/dL (ref 30.0–36.0)
MCV: 80.8 fL (ref 80.0–100.0)
Platelets: 412 10*3/uL — ABNORMAL HIGH (ref 150–400)
RBC: 4.28 MIL/uL (ref 3.87–5.11)
RDW: 15.9 % — ABNORMAL HIGH (ref 11.5–15.5)
WBC: 7.9 10*3/uL (ref 4.0–10.5)
nRBC: 0 % (ref 0.0–0.2)

## 2022-10-30 MED ORDER — ALPRAZOLAM 0.5 MG PO TABS: 0.5000 mg | ORAL_TABLET | Freq: Two times a day (BID) | ORAL | 0 refills | Status: DC

## 2022-10-30 NOTE — Plan of Care (Signed)
  Problem: Education: Goal: Knowledge of disease or condition will improve Outcome: Progressing Goal: Knowledge of secondary prevention will improve (MUST DOCUMENT ALL) Outcome: Progressing Goal: Knowledge of patient specific risk factors will improve Loraine Leriche N/A or DELETE if not current risk factor) Outcome: Progressing   Problem: Ischemic Stroke/TIA Tissue Perfusion: Goal: Complications of ischemic stroke/TIA will be minimized Outcome: Progressing   Problem: Coping: Goal: Will verbalize positive feelings about self Outcome: Progressing Goal: Will identify appropriate support needs Outcome: Progressing   Problem: Health Behavior/Discharge Planning: Goal: Ability to manage health-related needs will improve Outcome: Progressing Goal: Goals will be collaboratively established with patient/family Outcome: Progressing   Problem: Self-Care: Goal: Ability to participate in self-care as condition permits will improve Outcome: Progressing Goal: Verbalization of feelings and concerns over difficulty with self-care will improve Outcome: Progressing Goal: Ability to communicate needs accurately will improve Outcome: Progressing   Problem: Nutrition: Goal: Risk of aspiration will decrease Outcome: Progressing Goal: Dietary intake will improve Outcome: Progressing   Problem: Education: Goal: Knowledge of General Education information will improve Description: Including pain rating scale, medication(s)/side effects and non-pharmacologic comfort measures Outcome: Progressing   Problem: Health Behavior/Discharge Planning: Goal: Ability to manage health-related needs will improve Outcome: Progressing   Problem: Clinical Measurements: Goal: Ability to maintain clinical measurements within normal limits will improve Outcome: Progressing Goal: Will remain free from infection Outcome: Progressing Goal: Diagnostic test results will improve Outcome: Progressing Goal: Respiratory  complications will improve Outcome: Progressing Goal: Cardiovascular complication will be avoided Outcome: Progressing   Problem: Activity: Goal: Risk for activity intolerance will decrease Outcome: Progressing   Problem: Nutrition: Goal: Adequate nutrition will be maintained Outcome: Progressing   Problem: Coping: Goal: Level of anxiety will decrease Outcome: Progressing   Problem: Elimination: Goal: Will not experience complications related to bowel motility Outcome: Progressing Goal: Will not experience complications related to urinary retention Outcome: Progressing   Problem: Pain Managment: Goal: General experience of comfort will improve Outcome: Progressing   Problem: Safety: Goal: Ability to remain free from injury will improve Outcome: Progressing   Problem: Skin Integrity: Goal: Risk for impaired skin integrity will decrease Outcome: Progressing

## 2022-10-30 NOTE — Progress Notes (Signed)
Daily Progress Note   Patient Name: [name]       Date: 10/30/2022 DOB: March 16, 1929  Age: 89 y.o. MRN#: [id number] Attending Physician: Joycelyn Das, MD Primary Care Physician: Mast, Man X, NP Admit Date: 10/28/2022  Reason for Consultation/Follow-up: Establishing goals of care  Subjective: No family at bedside, denies complaints Per RN, ate well this morning.   Length of Stay: 1  Current Medications: Scheduled Meds:   atorvastatin  40 mg Oral Daily   docusate sodium  100 mg Oral Daily   feeding supplement  237 mL Oral BID BM   ferrous sulfate  325 mg Oral Once per day on Monday Thursday   furosemide  10 mg Oral Daily   latanoprost  1 drop Both Eyes QHS   levothyroxine  25 mcg Oral QAC breakfast   magnesium oxide  400 mg Oral Daily   metoprolol tartrate  25 mg Oral BID   pantoprazole  40 mg Oral Daily   polyethylene glycol  17 g Oral QODAY   potassium chloride  10 mEq Oral Daily   sodium chloride flush  3 mL Intravenous Q12H   sodium chloride  1 g Oral BID    Continuous Infusions:  sodium chloride      PRN Meds: sodium chloride, acetaminophen **OR** acetaminophen, polyvinyl alcohol, sodium chloride flush  Physical Exam Constitutional:      General: She is not in acute distress.    Appearance: She is ill-appearing.  Pulmonary:     Effort: Pulmonary effort is normal.  Skin:    General: Skin is warm and dry.  Neurological:     Mental Status: She is alert. She is disoriented.             Vital Signs: BP (!) 152/70 (BP Location: Left Arm)   Pulse 80   Temp 98 F (36.7 C) (Oral)   Resp 18   Ht 5\' 4"  (1.626 m)   Wt 80.6 kg   SpO2 100%   BMI 30.49 kg/m  SpO2: SpO2: 100 % O2 Device: O2 Device: Room Air O2 Flow Rate:    Intake/output summary: No intake or output  data in the 24 hours ending 10/30/22 1144 LBM: Last BM Date :  (pta) Baseline Weight: Weight: 80.6 kg Most recent weight: Weight: 80.6 kg         Patient Active Problem List   Diagnosis Date Noted   GI bleed 10/29/2022   History of CVA (cerebrovascular accident) 10/28/2022   Lower GI bleed 10/28/2022   Prolonged QT interval 10/28/2022   Dysphagia 10/28/2022   Failure to thrive in adult 10/28/2022   Hypomagnesemia 10/24/2022   CHF (congestive heart failure) (HCC) 10/10/2022   Rectal bleed 10/08/2022   Panic disorder 10/07/2022   Hematuria 10/02/2022   Right leg pain 07/17/2022   Itching 05/12/2022   Dysuria 12/20/2021   Stroke (cerebrum) (HCC) 11/25/2021   TIA (transient ischemic attack) 11/24/2021   Acute CVA (cerebrovascular accident) (HCC) 11/24/2021   Glaucoma 11/24/2021   Senile dementia (HCC) 11/07/2021   Hypothyroidism 03/05/2021   Atrial flutter (HCC) 03/05/2021   Cognitive impairment 03/05/2021   Urinary frequency 07/13/2020   Syncope 04/16/2020   Orthostatic hypotension  04/07/2020   Syncope and collapse 04/06/2020   Fall 04/06/2020   Diarrhea 10/07/2019   Muscle spasm of right lower extremity 08/05/2019   Hyponatremia 08/05/2019   Insomnia 08/05/2019   Slow transit constipation 08/05/2019   Atherosclerosis of abdominal aorta (HCC) 10/02/2015   Obesity (BMI 30-39.9) 10/31/2014   CKD (chronic kidney disease) stage 3, GFR 30-59 ml/min (HCC) 10/31/2014   Ventral incisional hernia 10/31/2014   Primary peritoneal carcinomatosis (HCC) 10/31/2014   Incisional hernia, periumbilical, without obstruction or gangrene 11/03/2012   Iron deficiency anemia 04/03/2011   Ovarian cancer (HCC) 01/25/2011   HLD (hyperlipidemia) 04/23/2009   Essential hypertension 04/23/2009   GERD 04/23/2009   Osteoarthritis 04/23/2009   Osteoporosis 04/23/2009    Palliative Care Assessment & Plan   HPI: 89 y.o. female   admitted on 10/28/2022 with past medical history significant of  GERD, HTN, HLD, hx of ovarian cancer, hx of CVA in 10/23, CKD stage 3, atrial flutter on eliquis, hypothyroidism, memory loss who presented to ED with concerns for bright red blood in her stool.    A few weeks ago they saw that she had blood in her stool. Patient lives at [facility]/skilled   Her hgb was checked and it was down some, but repeat it was better. Today when she was getting changed she had quite a bit of blood in her diaper and smeared on her stomach.    To ED, admitted for treatment and stabilization.  She was hypoxic, CHF exacerbation.    Per daughter today h/o  Covid/August 2024. Dramatic decline since;  UTI, Poor po intake, decrease physical and functional status.   Family face treatment option decisions, advanced d directives decisions and anticipatory care needs  Assessment: Follow up today with daughter Raynelle Fanning.  We review her conversation with [facility] today. She tells me she had a long talk with Dr. Tyson Babinski this morning that was very helpful. They plan to keep Ms. Dieppa off of anticoagulation for now and are not planning for any further workup. In fact, potential discharge today per Raynelle Fanning.  We review that her and Corrie Dandy discussed palliative/hospice support at [facility]. Reviewed in detail difference between hospice and palliative support.  We reviewed the MOST form that was given to her yesterday. She shares that she has located her mother's advance directive and plans to review it with her brother later today.  We discuss that since patient is discharging she can complete this form with hospice or palliative provider outpatient. We discuss she and her brother may not be able to choose hospice vs palliative prior to discharge - we review requesting this support post discharge.   We briefly discussed general concerns about quality of life and how to promote best quality of life. Code status was briefly discussed and concerns about outcomes if patient were to undergo  CPR.  Raynelle Fanning has PMT contact information and will call with any further questions or concerns.   Recommendations/Plan: Ongoing goals of care discussions that will continue outpatient, family deciding between hospice or palliative - no decision at this point, will request outpatient Family to review advance directive and complete MOST with provider outpatient  Code Status: Full code  Care plan was discussed with RN and patient's daughter Raynelle Fanning  Thank you for allowing the Palliative Medicine Team to assist in the care of this patient.   Total Time 45 minutes Prolonged Time Billed  no   Time spent includes: Detailed review of medical records (labs, imaging, vital signs), medically  appropriate exam, discussion with treatment team, counseling and educating patient, family and/or staff, documenting clinical information, medication management and coordination of care.     *Please note that this is a verbal dictation therefore any spelling or grammatical errors are due to the "[employer] One" system interpretation.  Gerlean Ren, DNP, [facility] Palliative Medicine Team Team Phone # [phone]  Pager [phone]

## 2022-10-30 NOTE — [place]2 (Signed)
[place] MEDICAID FL2 LEVEL OF CARE FORM     IDENTIFICATION  Patient Name: [name] Birthdate: [birth year] Sex: female Admission Date (Current Location): 10/28/2022  [facility] and [place] Number:  Producer, television/film/video and Address:  [facility] [place], [address] [place], [place] [address]      Provider Number: [id number]  Attending Physician Name and Address:  Joycelyn Das, MD  Relative Name and Phone Number:       Current Level of Care: Hospital Recommended Level of Care: Skilled Nursing Facility Prior Approval Number:    Date Approved/Denied:   PASRR Number:    Discharge Plan: SNF    Current Diagnoses: Patient Active Problem List   Diagnosis Date Noted   GI bleed 10/29/2022   History of CVA (cerebrovascular accident) 10/28/2022   Lower GI bleed 10/28/2022   Prolonged QT interval 10/28/2022   Dysphagia 10/28/2022   Failure to thrive in adult 10/28/2022   Hypomagnesemia 10/24/2022   CHF (congestive heart failure) (HCC) 10/10/2022   Rectal bleed 10/08/2022   Panic disorder 10/07/2022   Hematuria 10/02/2022   Right leg pain 07/17/2022   Itching 05/12/2022   Dysuria 12/20/2021   Stroke (cerebrum) (HCC) 11/25/2021   TIA (transient ischemic attack) 11/24/2021   Acute CVA (cerebrovascular accident) (HCC) 11/24/2021   Glaucoma 11/24/2021   Senile dementia (HCC) 11/07/2021   Hypothyroidism 03/05/2021   Atrial flutter (HCC) 03/05/2021   Cognitive impairment 03/05/2021   Urinary frequency 07/13/2020   Syncope 04/16/2020   Orthostatic hypotension 04/07/2020   Syncope and collapse 04/06/2020   Fall 04/06/2020   Diarrhea 10/07/2019   Muscle spasm of right lower extremity 08/05/2019   Hyponatremia 08/05/2019   Insomnia 08/05/2019   Slow transit constipation 08/05/2019   Atherosclerosis of abdominal aorta (HCC) 10/02/2015   Obesity (BMI 30-39.9) 10/31/2014   CKD (chronic kidney disease) stage 3, GFR 30-59 ml/min (HCC) 10/31/2014    Ventral incisional hernia 10/31/2014   Primary peritoneal carcinomatosis (HCC) 10/31/2014   Incisional hernia, periumbilical, without obstruction or gangrene 11/03/2012   Iron deficiency anemia 04/03/2011   Ovarian cancer (HCC) 01/25/2011   HLD (hyperlipidemia) 04/23/2009   Essential hypertension 04/23/2009   GERD 04/23/2009   Osteoarthritis 04/23/2009   Osteoporosis 04/23/2009    Orientation RESPIRATION BLADDER Height & Weight     Self  Normal External catheter, Incontinent Weight: 177 lb 9.6 oz (80.6 kg) Height:  5\' 4"  (162.6 cm)  BEHAVIORAL SYMPTOMS/MOOD NEUROLOGICAL BOWEL NUTRITION STATUS      Incontinent Diet (please see discharge summary)  AMBULATORY STATUS COMMUNICATION OF NEEDS Skin     Verbally Normal                       Personal Care Assistance Level of Assistance  Bathing, Feeding, Dressing Bathing Assistance: Maximum assistance Feeding assistance: Limited assistance Dressing Assistance: Maximum assistance     Functional Limitations Info  Sight, Hearing, Speech Sight Info: Adequate Hearing Info: Adequate Speech Info: Adequate    SPECIAL CARE FACTORS FREQUENCY                       Contractures Contractures Info: Not present    Additional Factors Info  Code Status, Allergies Code Status Info: FULL Allergies Info: Morphine And Codeine,Morphine Sulfate,Penicillins,Nsaids,Penicillin V,Shellfish Allergy,Shellfish-derived products           Current Medications (10/30/2022):  This is the current hospital active medication list Current Facility-Administered Medications  Medication Dose Route  Frequency Provider Last Rate Last Admin   0.9 %  sodium chloride infusion  250 mL Intravenous PRN Orland Mustard, MD       acetaminophen (TYLENOL) tablet 650 mg  650 mg Oral Q6H PRN Orland Mustard, MD   650 mg at 10/28/22 2121   Or   acetaminophen (TYLENOL) suppository 650 mg  650 mg Rectal Q6H PRN Orland Mustard, MD       atorvastatin (LIPITOR) tablet 40  mg  40 mg Oral Daily Orland Mustard, MD   40 mg at 10/30/22 1047   docusate sodium (COLACE) capsule 100 mg  100 mg Oral Daily Orland Mustard, MD   100 mg at 10/30/22 1047   feeding supplement (ENSURE ENLIVE / ENSURE PLUS) liquid 237 mL  237 mL Oral BID BM Orland Mustard, MD       ferrous sulfate tablet 325 mg  325 mg Oral Once per day on Monday Thursday Orland Mustard, MD   325 mg at 10/30/22 1047   furosemide (LASIX) tablet 10 mg  10 mg Oral Daily Orland Mustard, MD   10 mg at 10/30/22 1046   latanoprost (XALATAN) 0.005 % ophthalmic solution 1 drop  1 drop Both Eyes QHS Orland Mustard, MD   1 drop at 10/29/22 2302   levothyroxine (SYNTHROID) tablet 25 mcg  25 mcg Oral QAC breakfast Orland Mustard, MD   25 mcg at 10/30/22 0555   magnesium oxide (MAG-OX) tablet 400 mg  400 mg Oral Daily Arabella Merles, RPH   400 mg at 10/30/22 1047   metoprolol tartrate (LOPRESSOR) tablet 25 mg  25 mg Oral BID Orland Mustard, MD   25 mg at 10/30/22 1047   pantoprazole (PROTONIX) EC tablet 40 mg  40 mg Oral Daily Orland Mustard, MD   40 mg at 10/30/22 1047   polyethylene glycol (MIRALAX / GLYCOLAX) packet 17 g  17 g Oral Emelda Fear, MD   17 g at 10/30/22 1047   polyvinyl alcohol (LIQUIFILM TEARS) 1.4 % ophthalmic solution 1 drop  1 drop Left Eye PRN Orland Mustard, MD       potassium chloride (KLOR-CON) CR tablet 10 mEq  10 mEq Oral Daily Orland Mustard, MD   10 mEq at 10/30/22 1047   sodium chloride flush (NS) 0.9 % injection 3 mL  3 mL Intravenous Q12H Orland Mustard, MD   3 mL at 10/30/22 1047   sodium chloride flush (NS) 0.9 % injection 3 mL  3 mL Intravenous PRN Orland Mustard, MD       sodium chloride tablet 1 g  1 g Oral BID Orland Mustard, MD   1 g at 10/30/22 1047     Discharge Medications: Please see discharge summary for a list of discharge medications.  Relevant Imaging Results:  Relevant Lab Results:   Additional Information SSN [id number] palliative care to follow at [facility]  Eduard Roux, LCSW

## 2022-10-30 NOTE — Discharge Summary (Addendum)
Physician Discharge Summary  [name] KGM:[id number] DOB: Jul 10, 1929 DOA: 10/28/2022  PCP: Mast, Man X, NP  Admit date: 10/28/2022 Discharge date: 10/30/2022  Admitted From: Skilled nursing facility  Discharge disposition: Skilled nursing facility  Recommendations for Outpatient Follow-Up:   Follow up with your primary care provider at the skilled nursing facility in 3 to 5 days.  Check CBC at that time.  Discuss about Eliquis in the next visit if appropriate to be started. Palliative care to follow at the skilled nursing facility.  If patient continues to decline please consider hospice.  Discharge Diagnosis:   Principal Problem:   Lower GI bleed Active Problems:   Dysphagia   Failure to thrive in adult   Prolonged QT interval   Atrial flutter (HCC)   Essential hypertension   History of CVA (cerebrovascular accident)   Hypothyroidism   Hyponatremia   Senile dementia (HCC)   Ovarian cancer (HCC)   GERD   CKD (chronic kidney disease) stage 3, GFR 30-59 ml/min (HCC)   HLD (hyperlipidemia)   GI bleed   Discharge Condition: Improved.  Diet recommendation: Mechanical soft diet  Wound care: None.  Code status: Full.   History of Present Illness:   [name] is a 89 y.o. female with past medical history significant of GERD, HTN, hyperlipidemia hx of ovarian cancer, hx of CVA in 10/23, CKD stage 3, atrial flutter on eliquis, hypothyroidism, dementia, presented to the hospital with bright red blood in her stools.  History of COVID, CHF exacerbation, A-fib and UTI in the past.  Plan was to get echo as outpatient from her facility.  On this admission patient was afebrile.  Initial hemoglobin was 10.7, sodium: 132, creatinine: 1.18, INR: 1.9, fecal occult positive.  In the ED, type and screen was done and GI was consulted.  Patient was then admitted hospital for further evaluation and treatment    Hospital Course:   Following conditions were addressed during  hospitalization as listed below,  GI bleed. Bright red blood.  No further episodes while in the hospital.  Fecal occult positive.  Was on Eliquis for CVA atrial fibrillation.  Eliquis was kept on hold.  I did have a several conversations with the patient's daughter about risk versus benefits of Eliquis.  At this time plan is to hold off for few more days and discussed with the primary care provider at the skilled nursing facility after checking the blood work.  Patient has been advanced on dysphagia 3 diet.  Hemoglobin has remained stable.  Hemoglobin prior to discharge was 10.8.  Chronic diastolic CHF. Compensated. 2 D Echo with preserved EF.  Dysphagia Has some coughing spells after taking pills.  Speech therapy saw the patient during hospitalization in will be on mechanical soft diet.   Failure to thrive in adult Now bedbound with decreased oral intake mostly over a week.  Likely post-COVID versus worsening dementia.  Nutrition consult.  Palliative care was consulted.  At this time plan is to consider palliative care as outpatient at the skilled nursing facility.    Prolonged QT interval  Paroxysmal atrial flutter  2D echocardiogram showed preserved LV function.  Currently normal sinus rhythm.  Will hold off with Eliquis on discharge.  Continue metoprolol.   History of CVA (cerebrovascular accident) Continue lipitor, hold eliquis on discharge.   Hypothyroidism On Synthroid.  Will continue.  TSH within normal range.   Hyponatremia Sodium at 133 prior to discharge.  Mildly low.  Asymptomatic.  Physician Discharge Summary  [name] KGM:[id number] DOB: Jul 10, 1929 DOA: 10/28/2022  PCP: Mast, Man X, NP  Admit date: 10/28/2022 Discharge date: 10/30/2022  Admitted From: Skilled nursing facility  Discharge disposition: Skilled nursing facility  Recommendations for Outpatient Follow-Up:   Follow up with your primary care provider at the skilled nursing facility in 3 to 5 days.  Check CBC at that time.  Discuss about Eliquis in the next visit if appropriate to be started. Palliative care to follow at the skilled nursing facility.  If patient continues to decline please consider hospice.  Discharge Diagnosis:   Principal Problem:   Lower GI bleed Active Problems:   Dysphagia   Failure to thrive in adult   Prolonged QT interval   Atrial flutter (HCC)   Essential hypertension   History of CVA (cerebrovascular accident)   Hypothyroidism   Hyponatremia   Senile dementia (HCC)   Ovarian cancer (HCC)   GERD   CKD (chronic kidney disease) stage 3, GFR 30-59 ml/min (HCC)   HLD (hyperlipidemia)   GI bleed   Discharge Condition: Improved.  Diet recommendation: Mechanical soft diet  Wound care: None.  Code status: Full.   History of Present Illness:   [name] is a 89 y.o. female with past medical history significant of GERD, HTN, hyperlipidemia hx of ovarian cancer, hx of CVA in 10/23, CKD stage 3, atrial flutter on eliquis, hypothyroidism, dementia, presented to the hospital with bright red blood in her stools.  History of COVID, CHF exacerbation, A-fib and UTI in the past.  Plan was to get echo as outpatient from her facility.  On this admission patient was afebrile.  Initial hemoglobin was 10.7, sodium: 132, creatinine: 1.18, INR: 1.9, fecal occult positive.  In the ED, type and screen was done and GI was consulted.  Patient was then admitted hospital for further evaluation and treatment    Hospital Course:   Following conditions were addressed during  hospitalization as listed below,  GI bleed. Bright red blood.  No further episodes while in the hospital.  Fecal occult positive.  Was on Eliquis for CVA atrial fibrillation.  Eliquis was kept on hold.  I did have a several conversations with the patient's daughter about risk versus benefits of Eliquis.  At this time plan is to hold off for few more days and discussed with the primary care provider at the skilled nursing facility after checking the blood work.  Patient has been advanced on dysphagia 3 diet.  Hemoglobin has remained stable.  Hemoglobin prior to discharge was 10.8.  Chronic diastolic CHF. Compensated. 2 D Echo with preserved EF.  Dysphagia Has some coughing spells after taking pills.  Speech therapy saw the patient during hospitalization in will be on mechanical soft diet.   Failure to thrive in adult Now bedbound with decreased oral intake mostly over a week.  Likely post-COVID versus worsening dementia.  Nutrition consult.  Palliative care was consulted.  At this time plan is to consider palliative care as outpatient at the skilled nursing facility.    Prolonged QT interval  Paroxysmal atrial flutter  2D echocardiogram showed preserved LV function.  Currently normal sinus rhythm.  Will hold off with Eliquis on discharge.  Continue metoprolol.   History of CVA (cerebrovascular accident) Continue lipitor, hold eliquis on discharge.   Hypothyroidism On Synthroid.  Will continue.  TSH within normal range.   Hyponatremia Sodium at 133 prior to discharge.  Mildly low.  Asymptomatic.  Senile dementia Delirium precautions , Fall precautions.  Will closely monitor.  Has had a decline recently.  Patient's daughter does not wish any aggressive care.   Ovarian cancer  Stage 3, diagnosed in 2012.  Status post surgery and currently in remission.   GERD On PPI    CKD (chronic kidney disease) stage 3a, GFR 30-59 ml/min  At baseline.  Creatinine of 0.9.  Currently at  baseline.   HLD (hyperlipidemia) Continue lipitor   Disposition.  At this time, patient is stable for disposition to skilled nursing facility.  Medical Consultants:   Palliative care  Procedures:    None Subjective:   Today, patient was seen and examined at bedside.  Denies any nausea vomiting fever chills or rigor.  Has not had a bowel movement as per nursing staff.  Poor historian.  Discharge Exam:   Vitals:   10/29/22 2304 10/30/22 0350  BP: (!) 163/75 (!) 181/88  Pulse: 83 78  Resp: 17 16  Temp: 97.7 F (36.5 C) 98 F (36.7 C)  SpO2: 97% 98%   Vitals:   10/29/22 1605 10/29/22 1935 10/29/22 2304 10/30/22 0350  BP: (!) 171/72 (!) 156/76 (!) 163/75 (!) 181/88  Pulse: 74 80 83 78  Resp: 19 17 17 16   Temp: 97.7 F (36.5 C) 97.7 F (36.5 C) 97.7 F (36.5 C) 98 F (36.7 C)  TempSrc: Oral Oral Oral Oral  SpO2: 98% 98% 97% 98%  Weight:      Height:        General: Alert awake, not in obvious distress, elderly female, Communicative, HENT: pupils equally reacting to light, mild pallor noted.. Oral mucosa is moist.  Chest:  Clear breath sounds. No crackles or wheezes.  CVS: S1 &S2 heard. No murmur.  Regular rate and rhythm. Abdomen: Soft, nontender, nondistended.  Bowel sounds are heard.   Extremities: No cyanosis, clubbing or edema.  Peripheral pulses are palpable. Psych: Alert, awake and pleasantly confused, disoriented, answering questions, has underlying dementia. CNS:  No cranial nerve deficits.  Moves all extremities, generalized weakness. Skin: Warm and dry.  No rashes noted.  The results of significant diagnostics from this hospitalization (including imaging, microbiology, ancillary and laboratory) are listed below for reference.     Diagnostic Studies:   ECHOCARDIOGRAM COMPLETE  Result Date: 10/29/2022    ECHOCARDIOGRAM REPORT   Patient Name:   [name] [facility] Date of Exam: 10/29/2022 Medical Rec #:  [id number]        Height:       64.0 in Accession #:     [id number]       Weight:       177.6 lb Date of Birth:  [birth year]        BSA:          1.860 m Patient Age:    89 years         BP:           1150/72 mmHg Patient Gender: F                HR:           106 bpm. Exam Location:  Inpatient Procedure: 2D Echo, Color Doppler and Cardiac Doppler Indications:    Afib  History:        Patient has prior history of Echocardiogram examinations, most                 recent 01/04/2019. CKD and Stroke; Risk Factors:Dyslipidemia and  Senile dementia Delirium precautions , Fall precautions.  Will closely monitor.  Has had a decline recently.  Patient's daughter does not wish any aggressive care.   Ovarian cancer  Stage 3, diagnosed in 2012.  Status post surgery and currently in remission.   GERD On PPI    CKD (chronic kidney disease) stage 3a, GFR 30-59 ml/min  At baseline.  Creatinine of 0.9.  Currently at  baseline.   HLD (hyperlipidemia) Continue lipitor   Disposition.  At this time, patient is stable for disposition to skilled nursing facility.  Medical Consultants:   Palliative care  Procedures:    None Subjective:   Today, patient was seen and examined at bedside.  Denies any nausea vomiting fever chills or rigor.  Has not had a bowel movement as per nursing staff.  Poor historian.  Discharge Exam:   Vitals:   10/29/22 2304 10/30/22 0350  BP: (!) 163/75 (!) 181/88  Pulse: 83 78  Resp: 17 16  Temp: 97.7 F (36.5 C) 98 F (36.7 C)  SpO2: 97% 98%   Vitals:   10/29/22 1605 10/29/22 1935 10/29/22 2304 10/30/22 0350  BP: (!) 171/72 (!) 156/76 (!) 163/75 (!) 181/88  Pulse: 74 80 83 78  Resp: 19 17 17 16   Temp: 97.7 F (36.5 C) 97.7 F (36.5 C) 97.7 F (36.5 C) 98 F (36.7 C)  TempSrc: Oral Oral Oral Oral  SpO2: 98% 98% 97% 98%  Weight:      Height:        General: Alert awake, not in obvious distress, elderly female, Communicative, HENT: pupils equally reacting to light, mild pallor noted.. Oral mucosa is moist.  Chest:  Clear breath sounds. No crackles or wheezes.  CVS: S1 &S2 heard. No murmur.  Regular rate and rhythm. Abdomen: Soft, nontender, nondistended.  Bowel sounds are heard.   Extremities: No cyanosis, clubbing or edema.  Peripheral pulses are palpable. Psych: Alert, awake and pleasantly confused, disoriented, answering questions, has underlying dementia. CNS:  No cranial nerve deficits.  Moves all extremities, generalized weakness. Skin: Warm and dry.  No rashes noted.  The results of significant diagnostics from this hospitalization (including imaging, microbiology, ancillary and laboratory) are listed below for reference.     Diagnostic Studies:   ECHOCARDIOGRAM COMPLETE  Result Date: 10/29/2022    ECHOCARDIOGRAM REPORT   Patient Name:   [name] [facility] Date of Exam: 10/29/2022 Medical Rec #:  [id number]        Height:       64.0 in Accession #:     [id number]       Weight:       177.6 lb Date of Birth:  [birth year]        BSA:          1.860 m Patient Age:    89 years         BP:           1150/72 mmHg Patient Gender: F                HR:           106 bpm. Exam Location:  Inpatient Procedure: 2D Echo, Color Doppler and Cardiac Doppler Indications:    Afib  History:        Patient has prior history of Echocardiogram examinations, most                 recent 01/04/2019. CKD and Stroke; Risk Factors:Dyslipidemia and  Senile dementia Delirium precautions , Fall precautions.  Will closely monitor.  Has had a decline recently.  Patient's daughter does not wish any aggressive care.   Ovarian cancer  Stage 3, diagnosed in 2012.  Status post surgery and currently in remission.   GERD On PPI    CKD (chronic kidney disease) stage 3a, GFR 30-59 ml/min  At baseline.  Creatinine of 0.9.  Currently at  baseline.   HLD (hyperlipidemia) Continue lipitor   Disposition.  At this time, patient is stable for disposition to skilled nursing facility.  Medical Consultants:   Palliative care  Procedures:    None Subjective:   Today, patient was seen and examined at bedside.  Denies any nausea vomiting fever chills or rigor.  Has not had a bowel movement as per nursing staff.  Poor historian.  Discharge Exam:   Vitals:   10/29/22 2304 10/30/22 0350  BP: (!) 163/75 (!) 181/88  Pulse: 83 78  Resp: 17 16  Temp: 97.7 F (36.5 C) 98 F (36.7 C)  SpO2: 97% 98%   Vitals:   10/29/22 1605 10/29/22 1935 10/29/22 2304 10/30/22 0350  BP: (!) 171/72 (!) 156/76 (!) 163/75 (!) 181/88  Pulse: 74 80 83 78  Resp: 19 17 17 16   Temp: 97.7 F (36.5 C) 97.7 F (36.5 C) 97.7 F (36.5 C) 98 F (36.7 C)  TempSrc: Oral Oral Oral Oral  SpO2: 98% 98% 97% 98%  Weight:      Height:        General: Alert awake, not in obvious distress, elderly female, Communicative, HENT: pupils equally reacting to light, mild pallor noted.. Oral mucosa is moist.  Chest:  Clear breath sounds. No crackles or wheezes.  CVS: S1 &S2 heard. No murmur.  Regular rate and rhythm. Abdomen: Soft, nontender, nondistended.  Bowel sounds are heard.   Extremities: No cyanosis, clubbing or edema.  Peripheral pulses are palpable. Psych: Alert, awake and pleasantly confused, disoriented, answering questions, has underlying dementia. CNS:  No cranial nerve deficits.  Moves all extremities, generalized weakness. Skin: Warm and dry.  No rashes noted.  The results of significant diagnostics from this hospitalization (including imaging, microbiology, ancillary and laboratory) are listed below for reference.     Diagnostic Studies:   ECHOCARDIOGRAM COMPLETE  Result Date: 10/29/2022    ECHOCARDIOGRAM REPORT   Patient Name:   [name] [facility] Date of Exam: 10/29/2022 Medical Rec #:  [id number]        Height:       64.0 in Accession #:     [id number]       Weight:       177.6 lb Date of Birth:  [birth year]        BSA:          1.860 m Patient Age:    89 years         BP:           1150/72 mmHg Patient Gender: F                HR:           106 bpm. Exam Location:  Inpatient Procedure: 2D Echo, Color Doppler and Cardiac Doppler Indications:    Afib  History:        Patient has prior history of Echocardiogram examinations, most                 recent 01/04/2019. CKD and Stroke; Risk Factors:Dyslipidemia and  Physician Discharge Summary  [name] KGM:[id number] DOB: Jul 10, 1929 DOA: 10/28/2022  PCP: Mast, Man X, NP  Admit date: 10/28/2022 Discharge date: 10/30/2022  Admitted From: Skilled nursing facility  Discharge disposition: Skilled nursing facility  Recommendations for Outpatient Follow-Up:   Follow up with your primary care provider at the skilled nursing facility in 3 to 5 days.  Check CBC at that time.  Discuss about Eliquis in the next visit if appropriate to be started. Palliative care to follow at the skilled nursing facility.  If patient continues to decline please consider hospice.  Discharge Diagnosis:   Principal Problem:   Lower GI bleed Active Problems:   Dysphagia   Failure to thrive in adult   Prolonged QT interval   Atrial flutter (HCC)   Essential hypertension   History of CVA (cerebrovascular accident)   Hypothyroidism   Hyponatremia   Senile dementia (HCC)   Ovarian cancer (HCC)   GERD   CKD (chronic kidney disease) stage 3, GFR 30-59 ml/min (HCC)   HLD (hyperlipidemia)   GI bleed   Discharge Condition: Improved.  Diet recommendation: Mechanical soft diet  Wound care: None.  Code status: Full.   History of Present Illness:   [name] is a 89 y.o. female with past medical history significant of GERD, HTN, hyperlipidemia hx of ovarian cancer, hx of CVA in 10/23, CKD stage 3, atrial flutter on eliquis, hypothyroidism, dementia, presented to the hospital with bright red blood in her stools.  History of COVID, CHF exacerbation, A-fib and UTI in the past.  Plan was to get echo as outpatient from her facility.  On this admission patient was afebrile.  Initial hemoglobin was 10.7, sodium: 132, creatinine: 1.18, INR: 1.9, fecal occult positive.  In the ED, type and screen was done and GI was consulted.  Patient was then admitted hospital for further evaluation and treatment    Hospital Course:   Following conditions were addressed during  hospitalization as listed below,  GI bleed. Bright red blood.  No further episodes while in the hospital.  Fecal occult positive.  Was on Eliquis for CVA atrial fibrillation.  Eliquis was kept on hold.  I did have a several conversations with the patient's daughter about risk versus benefits of Eliquis.  At this time plan is to hold off for few more days and discussed with the primary care provider at the skilled nursing facility after checking the blood work.  Patient has been advanced on dysphagia 3 diet.  Hemoglobin has remained stable.  Hemoglobin prior to discharge was 10.8.  Chronic diastolic CHF. Compensated. 2 D Echo with preserved EF.  Dysphagia Has some coughing spells after taking pills.  Speech therapy saw the patient during hospitalization in will be on mechanical soft diet.   Failure to thrive in adult Now bedbound with decreased oral intake mostly over a week.  Likely post-COVID versus worsening dementia.  Nutrition consult.  Palliative care was consulted.  At this time plan is to consider palliative care as outpatient at the skilled nursing facility.    Prolonged QT interval  Paroxysmal atrial flutter  2D echocardiogram showed preserved LV function.  Currently normal sinus rhythm.  Will hold off with Eliquis on discharge.  Continue metoprolol.   History of CVA (cerebrovascular accident) Continue lipitor, hold eliquis on discharge.   Hypothyroidism On Synthroid.  Will continue.  TSH within normal range.   Hyponatremia Sodium at 133 prior to discharge.  Mildly low.  Asymptomatic.

## 2022-10-30 NOTE — Progress Notes (Signed)
This chaplain responded to PMT NP-Mary's consult for a visit and prayer with the Pt.   The chaplain understands the Pt. recognizes the [facility] [place] faith tradition. The chaplain was able to connect with the Pt. about her story and places of value in the [employer]. The chaplain observed talking about the Pt. history in the church and singing Blessed Assurance was calming to the Pt.   The Pt. was appreciative of the visit and accepted the chaplain's invitation for prayer.  Chaplain Stephanie Acre [phone]

## 2022-10-30 NOTE — TOC Progression Note (Signed)
Transition of Care [facility]) - Progression Note    Patient Details  Name: [name] MRN: [id number] Date of Birth: February 03, 1930  Transition of Care [facility]) CM/SW Contact  Eduard Roux, [place] Phone Number: 10/30/2022, 10:35 AM  Clinical Narrative:     CSW called [facility] left voice message- waiting on response.  Antony Blackbird, MSW, LCSW Clinical Social Worker    Expected Discharge Plan: Skilled Nursing Facility Barriers to Discharge: Continued Medical Work up  Expected Discharge Plan and Services                                               Social Determinants of Health (SDOH) Interventions SDOH Screenings   Food Insecurity: No Food Insecurity (10/28/2022)  Housing: Low Risk  (10/28/2022)  Transportation Needs: No Transportation Needs (10/28/2022)  Utilities: Not At Risk (10/28/2022)  Alcohol Screen: Low Risk  (08/23/2021)  Depression (PHQ2-9): Low Risk  (10/01/2022)  Financial Resource Strain: Low Risk  (08/23/2021)  Physical Activity: Insufficiently Active (08/23/2021)  Social Connections: Socially Isolated (08/23/2021)  Stress: No Stress Concern Present (08/16/2020)  Tobacco Use: Medium Risk (10/28/2022)    Readmission Risk Interventions     No data to display

## 2022-10-30 NOTE — TOC Transition Note (Signed)
Transition of Care [facility]) - CM/SW Discharge Note   Patient Details  Name: [name] MRN: [id number] Date of Birth: Mar 11, 1929  Transition of Care [facility]) CM/SW Contact:  Eduard Roux, LCSW Phone Number: 10/30/2022, 1:00 PM   Clinical Narrative:     Patient will Discharge to: [facility]  Discharge Date: 10/30/2022 Family Notified: daughter Transport [id number]  Per MD patient is ready for discharge. RN, patient, and facility notified of discharge. Discharge Summary sent to facility. RN given number for [phone]. Ambulance transport requested for patient.   Clinical Social Worker signing off.  Antony Blackbird, MSW, LCSW Clinical Social Worker     Final next level of care: Skilled Nursing Facility Barriers to Discharge: Barriers Resolved   Patient Goals and CMS Choice      Discharge Placement                Patient chooses bed at:  ([facility]) Patient to be transferred to facility by: [facility] Name of family member notified: daughter Patient and family notified of of transfer: 10/30/22  Discharge Plan and Services Additional resources added to the After Visit Summary for                                       Social Determinants of Health (SDOH) Interventions SDOH Screenings   Food Insecurity: No Food Insecurity (10/28/2022)  Housing: Low Risk  (10/28/2022)  Transportation Needs: No Transportation Needs (10/28/2022)  Utilities: Not At Risk (10/28/2022)  Alcohol Screen: Low Risk  (08/23/2021)  Depression (PHQ2-9): Low Risk  (10/01/2022)  Financial Resource Strain: Low Risk  (08/23/2021)  Physical Activity: Insufficiently Active (08/23/2021)  Social Connections: Socially Isolated (08/23/2021)  Stress: No Stress Concern Present (08/16/2020)  Tobacco Use: Medium Risk (10/28/2022)     Readmission Risk Interventions     No data to display

## 2022-10-31 ENCOUNTER — Telehealth: Payer: Self-pay

## 2022-10-31 ENCOUNTER — Non-Acute Institutional Stay (SKILLED_NURSING_FACILITY): Payer: Medicare Other | Admitting: Sports Medicine

## 2022-10-31 ENCOUNTER — Encounter: Payer: Self-pay | Admitting: Sports Medicine

## 2022-10-31 DIAGNOSIS — R000 Tachycardia, unspecified: Secondary | ICD-10-CM

## 2022-10-31 DIAGNOSIS — K625 Hemorrhage of anus and rectum: Secondary | ICD-10-CM | POA: Diagnosis not present

## 2022-10-31 DIAGNOSIS — R0902 Hypoxemia: Secondary | ICD-10-CM | POA: Diagnosis not present

## 2022-10-31 DIAGNOSIS — I509 Heart failure, unspecified: Secondary | ICD-10-CM

## 2022-10-31 DIAGNOSIS — E871 Hypo-osmolality and hyponatremia: Secondary | ICD-10-CM

## 2022-10-31 DIAGNOSIS — F0390 Unspecified dementia without behavioral disturbance: Secondary | ICD-10-CM

## 2022-10-31 NOTE — Telephone Encounter (Signed)
Already spoke to patient's daughter about lab results.Daughter stated mother is not doing good.She has been in [facility] hospital this week.Stated she will be receiving Hospice care.

## 2022-10-31 NOTE — Progress Notes (Signed)
Provider:   Venita Sheffield MD Location:   [facility]   Place of Service:   SNF   PCP: Mast, Man X, NP Patient Care Team: Mast, Man X, NP as PCP - General (Internal Medicine) [name], MD as PCP - Cardiology (Cardiology) Olivia Mackie, MD as Consulting Physician (Obstetrics and Gynecology) De Blanch, MD as Consulting Physician (Gynecology) Cleda Mccreedy, MD as Consulting Physician (Gynecologic Oncology)  Extended Emergency Contact Information Primary Emergency Contact: [facility] Address: [address]          [place], [place] [address] D[place] of [place] Home Phone: [phone] Mobile Phone: [phone] Relation: Daughter  Code Status:  Goals of Care: Advanced Directive information    10/28/2022    3:49 PM  Advanced Directives  Does Patient Have a Medical Advance Directive? Yes  Type of Advance Directive Living will;Healthcare Power of Attorney  Does patient want to make changes to medical advance directive? Yes (Inpatient - patient defers changing a medical advance directive and declines information at this time)  Copy of Healthcare Power of Attorney in Chart? Yes - validated most recent copy scanned in chart (See row information)      No chief complaint on file.   HPI: Patient is a 89 y.o. female seen today for Hospital follow up after her recent hospitalization for rectal bleeding. Patient came to the facility last night and was doing fine until this morning. Patient had an episode this morning with lethargy and tachycardia with heart rate 140 and desaturation.  [facility] family member who initially wanted patient to go to the emergency room room but later after DO and spoke with the POA, family agreed for hospice. Patient seen and examined in her room, she is alert and opens her eyes when called her name.  She knows her name States she is doing fine.  Denied chest pain, abdominal  pain Patient is saturating 99% on 2 L nasal cannula, her heart rate of 140.  She did not receive morning dose of metoprolol.  Instructed nursing staff to give the morning dose of metoprolol.  Family  agreed for hospice.  Past Medical History:  Diagnosis Date   Anemia associated with acute blood loss 04/03/2011   Arthritis    knees    Ascites    Blood transfusion    hx of 2011    Complication of anesthesia    Sodium drops per pt    Cystitis    DIARRHEA, ANTIBIOTIC ASSOCIATED 05/31/2009   Qualifier: Diagnosis of  By: Daiva Eves MD, Cornelius     GERD (gastroesophageal reflux disease)    H/O hiatal hernia    Hyperlipidemia    Hypertension    Hyponatremia    Neutropenia with fever (HCC) 05/18/2011   OSTEOMYELITIS, CHRONIC, LOWER LEG 04/23/2009   Qualifier: Diagnosis of  By: Ninetta Lights MD, Linus Mako    OSTEOPOROSIS 04/23/2009   Qualifier: Diagnosis of  By: Ninetta Lights MD, Jeffrey     Ovarian cancer (HCC) 01/25/2011   Pneumonia    hx of    Postmenopausal atrophic vaginitis    PROSTHETIC JOINT COMPLICATION 05/28/2009   Qualifier: Diagnosis of  By: Daiva Eves MD, Cornelius     Renal disorder    Decreased kidney function   Staph infection 2010   after knee replacement   Urinary frequency    Vulvitis    Past Surgical History:  Procedure Laterality Date   ABDOMINAL HYSTERECTOMY  04/01/2011  Procedure: HYSTERECTOMY ABDOMINAL;  Surgeon: Jeannette Corpus, MD;  Location: [facility];  Service: Gynecology;  Laterality: N/A;   APPENDECTOMY     JOINT REPLACEMENT     R knee in 2008, 5 operations on L knee   LAPAROTOMY  04/01/2011   Procedure: EXPLORATORY LAPAROTOMY;  Surgeon: Jeannette Corpus, MD;  Location: [facility];  Service: Gynecology;  Laterality: N/A;   OTHER SURGICAL HISTORY     hx of C section 1966   SALPINGOOPHORECTOMY  04/01/2011   Procedure: SALPINGO OOPHERECTOMY;  Surgeon: Jeannette Corpus, MD;  Location: [facility];  Service: Gynecology;  Laterality: Bilateral;    TONSILLECTOMY      reports that she quit smoking about 70 years ago. Her smoking use included cigarettes. She has never used smokeless tobacco. She reports that she does not currently use alcohol. She reports that she does not use drugs. Social History   Socioeconomic History   Marital status: Widowed    Spouse name: Not on file   Number of children: Not on file   Years of education: Not on file   Highest education level: Not on file  Occupational History   Not on file  Tobacco Use   Smoking status: Former    Current packs/day: 0.00    Types: Cigarettes    Quit date: 02/11/1952    Years since quitting: 70.7   Smokeless tobacco: Never  Vaping Use   Vaping status: Never Used  Substance and Sexual Activity   Alcohol use: Not Currently   Drug use: Never   Sexual activity: Never  Other Topics Concern   Not on file  Social History Narrative   Not on file   Social Determinants of Health   Financial Resource Strain: Low Risk  (08/23/2021)   Overall Financial Resource Strain (CARDIA)    Difficulty of Paying Living Expenses: Not hard at all  Food Insecurity: No Food Insecurity (10/28/2022)   Hunger Vital Sign    Worried About Running Out of Food in the Last Year: Never true    Ran Out of Food in the Last Year: Never true  Transportation Needs: No Transportation Needs (10/28/2022)   PRAPARE - Administrator, Civil Service (Medical): No    Lack of Transportation (Non-Medical): No  Physical Activity: Insufficiently Active (08/23/2021)   Exercise Vital Sign    Days of Exercise per Week: 7 days    Minutes of Exercise per Session: 10 min  Stress: No Stress Concern Present (08/16/2020)   [employer] of Occupational Health - Occupational Stress Questionnaire    Feeling of Stress : Only a little  Social Connections: Socially Isolated (08/23/2021)   Social Connection and Isolation Panel [NHANES]    Frequency of Communication with Friends and Family: More than three times a  week    Frequency of Social Gatherings with Friends and Family: Three times a week    Attends Religious Services: Never    Active Member of Clubs or Organizations: No    Attends Banker Meetings: Never    Marital Status: Widowed  Intimate Partner Violence: Not At Risk (10/28/2022)   Humiliation, Afraid, Rape, and Kick questionnaire    Fear of Current or Ex-Partner: No    Emotionally Abused: No    Physically Abused: No    Sexually Abused: No    Functional Status Survey:    Family History  Problem Relation Age of Onset   Cancer Other        Bladder  cancer   Heart attack Brother    Diabetes Brother     Health Maintenance  Topic Date Due   DTaP/Tdap/Td (5 - Td or Tdap) 10/15/2020   INFLUENZA VACCINE  09/11/2022   COVID-19 Vaccine (6 - 2023-24 season) 10/12/2022   Medicare Annual Wellness (AWV)  10/01/2023   Pneumonia Vaccine 45+ Years old  Completed   DEXA SCAN  Completed   HPV VACCINES  Aged Out   Zoster Vaccines- Shingrix  Discontinued    Allergies  Allergen Reactions   Morphine And Codeine Anaphylaxis and Other (See Comments)    Given after knee replacement and code blue occurred   Morphine Sulfate Anaphylaxis   Penicillins Hives, Itching and Other (See Comments)    Syncope, also   Nsaids Other (See Comments)    Because of an abnormal kidney test result   Penicillin V Hives   Shellfish Allergy Nausea And Vomiting and Other (See Comments)    Pt has shellfish allergy only.  Has had IV contrast x 2 and did fine.   Shellfish-Derived Products Nausea And Vomiting    Outpatient Encounter Medications as of 10/31/2022  Medication Sig   acetaminophen (TYLENOL) 325 MG tablet Take 650 mg by mouth every 4 (four) hours as needed for moderate pain or fever.   ALPRAZolam (XANAX) 0.5 MG tablet Take 1 tablet (0.5 mg total) by mouth every 12 (twelve) hours.   atorvastatin (LIPITOR) 40 MG tablet Take 1 tablet (40 mg total) by mouth daily.   B Complex-C-Folic Acid  (B-COMPLEX/FOLIC ACID/VITAMIN C PO) Take 1 tablet by mouth daily with breakfast.   bimatoprost (LUMIGAN) 0.03 % ophthalmic solution Place 1 drop into both eyes at bedtime.   Cholecalciferol (VITAMIN D3) 50 MCG (2000 UT) TABS Take 2,000 Units by mouth in the morning.   cloNIDine (CATAPRES) 0.1 MG tablet Take 0.1 mg by mouth daily as needed (hypertension).   docusate sodium (COLACE) 100 MG capsule Take 100 mg by mouth daily.   ferrous sulfate 325 (65 FE) MG EC tablet Take 325 mg by mouth 2 (two) times a week. Mon, Thrusday   furosemide (LASIX) 20 MG tablet Take 10 mg by mouth daily.   lactose free nutrition (BOOST PLUS) LIQD Take 1 Container by mouth 3 (three) times daily.   levothyroxine (SYNTHROID) 25 MCG tablet Take 25 mcg by mouth daily before breakfast.   Magnesium Oxide 400 MG CAPS Take 400 mg daily   metoprolol tartrate (LOPRESSOR) 25 MG tablet Take 25 mg by mouth 2 (two) times daily.   mirabegron ER (MYRBETRIQ) 25 MG TB24 tablet Take 25 mg by mouth daily.   nystatin (MYCOSTATIN/NYSTOP) powder Apply 1 application  topically 2 (two) times daily as needed (reason not listed).   OXYGEN Inhale 2 L into the lungs as needed (if spo2 < 90 %).   pantoprazole (PROTONIX) 40 MG tablet Take 40 mg by mouth daily.   polyethylene glycol (MIRALAX / GLYCOLAX) 17 g packet Take 17 g by mouth every other day.   polyvinyl alcohol (LIQUIFILM TEARS) 1.4 % ophthalmic solution Place 2 drops into both eyes in the morning, at noon, in the evening, and at bedtime.   potassium chloride (KLOR-CON) 10 MEQ tablet Take 10 mEq by mouth daily.   sodium chloride 1 g tablet Take 1 g by mouth 2 (two) times daily.   No facility-administered encounter medications on file as of 10/31/2022.    Review of Systems  Unable to perform ROS: Acuity of condition  Respiratory:  Negative  for cough and chest tightness.   Cardiovascular:  Negative for chest pain.  Gastrointestinal:  Negative for abdominal pain, nausea, rectal pain and  vomiting.  Genitourinary:  Negative for hematuria.    There were no vitals filed for this visit. There is no height or weight on file to calculate BMI. Physical Exam Constitutional:      General: She is not in acute distress.    Appearance: Normal appearance.  Cardiovascular:     Rate and Rhythm: Tachycardia present.     Pulses: Normal pulses.     Heart sounds: Normal heart sounds.  Pulmonary:     Effort: No respiratory distress.     Breath sounds: No wheezing.  Abdominal:     General: There is no distension.     Tenderness: There is no abdominal tenderness. There is no guarding or rebound.  Musculoskeletal:        General: No swelling.  Neurological:     Mental Status: She is alert.     Labs reviewed: Basic Metabolic Panel: Recent Labs    08/31/22 1134 10/07/22 0000 10/28/22 1227 10/29/22 1004  NA 136  138 135* 132* 133*  K 3.9  4.3 4.1 4.4 4.0  CL 104  102 104 95* 101  CO2 24 24* 25 22  GLUCOSE 108*  105*  --  131* 108*  BUN 21  26* 15 17 15   CREATININE 1.17*  1.30* 1.0 1.18* 0.92  CALCIUM 8.7* 8.1* 8.7* 8.7*  MG  --   --  1.7  --    Liver Function Tests: Recent Labs    01/22/22 0925 08/31/22 1134 10/07/22 0000 10/28/22 1227  AST 26 27 17 30   ALT 16 19 14 22   ALKPHOS 68 83 80 79  BILITOT 0.6 0.7  --  0.5  PROT 7.1 7.2  --  6.7  ALBUMIN 3.5 3.4* 2.7* 2.4*   No results for input(s): "LIPASE", "AMYLASE" in the last 8760 hours. No results for input(s): "AMMONIA" in the last 8760 hours. CBC: Recent Labs    10/07/22 0000 10/09/22 0000 10/28/22 1227 10/29/22 0048 10/29/22 0528 10/30/22 0250  WBC 7.3 8.5 10.0 8.9 8.9 7.9  NEUTROABS 5,446.00 6,656.00 7.7  --   --   --   HGB 9.6* 11.0* 10.7* 9.6* 10.7* 10.8*  HCT 30* 34* 33.3* 29.7* 35.2* 34.6*  MCV  --   --  81.8 81.8 83.4 80.8  PLT 315 324 434* 409* 427* 412*   Cardiac Enzymes: No results for input(s): "CKTOTAL", "CKMB", "CKMBINDEX", "TROPONINI" in the last 8760 hours. BNP: Invalid  input(s): "POCBNP" Lab Results  Component Value Date   HGBA1C 5.7 (H) 11/24/2021   Lab Results  Component Value Date   TSH 4.376 10/28/2022   Lab Results  Component Value Date   VITAMINB12 1,179 10/09/2022   No results found for: "FOLATE" Lab Results  Component Value Date   IRON 27 10/09/2022   TIBC 189 10/09/2022   FERRITIN 239 10/09/2022    Imaging and Procedures obtained prior to SNF admission: ECHOCARDIOGRAM COMPLETE  Result Date: 10/29/2022    ECHOCARDIOGRAM REPORT   Patient Name:   LORAIN ZENS [facility] Date of Exam: 10/29/2022 Medical Rec #:  [id number]        Height:       64.0 in Accession #:    [id number]       Weight:       177.6 lb Date of Birth:  Dec 23, 1929  BSA:          1.860 m Patient Age:    89 years         BP:           1150/72 mmHg Patient Gender: F                HR:           106 bpm. Exam Location:  Inpatient Procedure: 2D Echo, Color Doppler and Cardiac Doppler Indications:    Afib  History:        Patient has prior history of Echocardiogram examinations, most                 recent 01/04/2019. CKD and Stroke; Risk Factors:Dyslipidemia and                 Hypertension.  Sonographer:    Milbert Coulter Referring Phys: [id number] Orland Mustard  Sonographer Comments: Suboptimal subcostal window. Image acquisition challenging due to uncooperative patient. IMPRESSIONS  1. Left ventricular ejection fraction, by estimation, is 70 to 75%. The left ventricle has hyperdynamic function. The left ventricle has no regional wall motion abnormalities. There is moderate concentric left ventricular hypertrophy. Left ventricular diastolic parameters are consistent with Grade I diastolic dysfunction (impaired relaxation). Elevated left atrial pressure.  2. Right ventricular systolic function is normal. The right ventricular size is not well visualized. Tricuspid regurgitation signal is inadequate for assessing PA pressure.  3. Left atrial size was moderately dilated.  4. Right atrial size was  mildly dilated.  5. Moderate pleural effusion in the left lateral region.  6. The mitral valve is normal in structure. Mild to moderate mitral valve regurgitation. Moderate mitral annular calcification.  7. The aortic valve is tricuspid. There is mild calcification of the aortic valve. There is moderate thickening of the aortic valve. Aortic valve regurgitation is mild to moderate. Aortic valve sclerosis/calcification is present, without any evidence of  aortic stenosis.  8. There is borderline dilatation of the ascending aorta, measuring 38 mm. Comparison(s): No significant change from prior study. Prior images reviewed side by side. FINDINGS  Left Ventricle: Left ventricular ejection fraction, by estimation, is 70 to 75%. The left ventricle has hyperdynamic function. The left ventricle has no regional wall motion abnormalities. The left ventricular internal cavity size was normal in size. There is moderate concentric left ventricular hypertrophy. Left ventricular diastolic parameters are consistent with Grade I diastolic dysfunction (impaired relaxation). Elevated left atrial pressure. Right Ventricle: The right ventricular size is not well visualized. Right vetricular wall thickness was not well visualized. Right ventricular systolic function is normal. Tricuspid regurgitation signal is inadequate for assessing PA pressure. Left Atrium: Left atrial size was moderately dilated. Right Atrium: Right atrial size was mildly dilated. Pericardium: There is no evidence of pericardial effusion. Mitral Valve: The mitral valve is normal in structure. Moderate mitral annular calcification. Mild to moderate mitral valve regurgitation. Tricuspid Valve: The tricuspid valve is normal in structure. Tricuspid valve regurgitation is not demonstrated. Aortic Valve: The aortic valve is tricuspid. There is mild calcification of the aortic valve. There is moderate thickening of the aortic valve. Aortic valve regurgitation is mild to  moderate. Aortic regurgitation PHT measures 441 msec. Aortic valve sclerosis/calcification is present, without any evidence of aortic stenosis. Aortic valve mean gradient measures 8.0 mmHg. Aortic valve peak gradient measures 14.9 mmHg. Aortic valve area, by VTI measures 1.83 cm. Pulmonic Valve: The pulmonic valve was grossly normal. Pulmonic valve regurgitation is trivial. No  evidence of pulmonic stenosis. Aorta: The aortic root is normal in size and structure. There is borderline dilatation of the ascending aorta, measuring 38 mm. IAS/Shunts: The interatrial septum was not well visualized. Additional Comments: There is a moderate pleural effusion in the left lateral region.  LEFT VENTRICLE PLAX 2D LVIDd:         3.80 cm   Diastology LVIDs:         2.20 cm   LV e' medial:    4.82 cm/s LV PW:         1.30 cm   LV E/e' medial:  17.8 LV IVS:        1.50 cm   LV e' lateral:   7.15 cm/s LVOT diam:     2.00 cm   LV E/e' lateral: 12.0 LV SV:         75 LV SV Index:   40 LVOT Area:     3.14 cm  RIGHT VENTRICLE RV Basal diam:  2.90 cm RV Mid diam:    2.50 cm RV S prime:     11.00 cm/s TAPSE (M-mode): 1.5 cm LEFT ATRIUM             Index        RIGHT ATRIUM           Index LA diam:        4.40 cm 2.37 cm/m   RA Area:     19.20 cm LA Vol (A2C):   85.0 ml 45.70 ml/m  RA Volume:   50.70 ml  27.26 ml/m LA Vol (A4C):   75.1 ml 40.38 ml/m LA Biplane Vol: 80.3 ml 43.17 ml/m  AORTIC VALVE AV Area (Vmax):    1.52 cm AV Area (Vmean):   1.63 cm AV Area (VTI):     1.83 cm AV Vmax:           193.00 cm/s AV Vmean:          132.000 cm/s AV VTI:            0.408 m AV Peak Grad:      14.9 mmHg AV Mean Grad:      8.0 mmHg LVOT Vmax:         93.40 cm/s LVOT Vmean:        68.300 cm/s LVOT VTI:          0.238 m LVOT/AV VTI ratio: 0.58 AI PHT:            441 msec  AORTA Ao Root diam: 2.90 cm Ao Asc diam:  3.80 cm MITRAL VALVE MV Area (PHT): 3.99 cm    SHUNTS MV Decel Time: 190 msec    Systemic VTI:  0.24 m MV E velocity: 85.70 cm/s   Systemic Diam: 2.00 cm MV A velocity: 95.70 cm/s MV E/A ratio:  0.90 Mihai Croitoru MD Electronically signed by Thurmon Fair MD Signature Date/Time: 10/29/2022/2:24:04 PM    Final    DG CHEST PORT 1 VIEW  Result Date: 10/28/2022 CLINICAL DATA:  Acute exacerbation of CHF.  Shortness of breath. EXAM: PORTABLE CHEST 1 VIEW COMPARISON:  01/22/2022 FINDINGS: Lung volumes are low. Grossly stable heart size, left heart border partially obscured by pleural effusion and opacity. Aortic atherosclerosis. Opacification of the lower left hemithorax likely related to tracking pleural effusion and atelectasis. Slight vascular congestion. No pneumothorax. IMPRESSION: 1. Low lung volumes. Opacification of the lower left hemithorax likely related to tracking pleural effusion and atelectasis. 2. Slight vascular congestion.  Electronically Signed   By: Narda Rutherford M.D.   On: 10/28/2022 16:34    Assessment/Plan  1. Rectal bleed No episodes since yesterday Will monitor  2. Hypoxia Patient is hypoxic and tachycardic this morning She is currently on 2 L nasal cannula Patient is able to speak 1-2 word answers Family is aware,  POA agreed for hospice  3. Tachycardia Continue with metoprolol  4. Congestive heart failure, unspecified HF chronicity, unspecified heart failure type (HCC) Euvolemic on exam   5. Senile dementia (HCC) .  With supportive care  6. Hyponatremia Sodium 133 . Family/ staff Communication: care plan discussed with the nursing staff    I spent greater than 45 minutes for the care of this patient in face to face time, chart review, clinical documentation, patient education.

## 2022-10-31 NOTE — Telephone Encounter (Signed)
Ms.Brooks called from [facility] stating that patient has low bp, and high pulse. She states patient doesn't look well. She's lethargic and looks clammy. Patient is Full Code and is palliative care. She contacted POA and they advised patient goes to ER. Ms.Brooks told me to call number back and speak to her but there is no number for me to see considering this is on clinical intake voicemail. Message routed to PCP Mast, Man X, NP .

## 2022-11-03 ENCOUNTER — Ambulatory Visit (HOSPITAL_COMMUNITY): Payer: Medicare Other

## 2022-11-03 ENCOUNTER — Non-Acute Institutional Stay (SKILLED_NURSING_FACILITY): Payer: Self-pay | Admitting: Sports Medicine

## 2022-11-03 ENCOUNTER — Encounter: Payer: Self-pay | Admitting: Sports Medicine

## 2022-11-03 DIAGNOSIS — K625 Hemorrhage of anus and rectum: Secondary | ICD-10-CM

## 2022-11-03 DIAGNOSIS — R0902 Hypoxemia: Secondary | ICD-10-CM

## 2022-11-03 DIAGNOSIS — I4892 Unspecified atrial flutter: Secondary | ICD-10-CM

## 2022-11-03 NOTE — Progress Notes (Signed)
Provider:    Venita Sheffield MD Location:   [facility]   Place of Service:   [facility]   PCP: Mast, Man X, NP Patient Care Team: Mast, Man X, NP as PCP - General (Internal Medicine) Jennifer Ishikawa, MD as PCP - Cardiology (Cardiology) Olivia Mackie, MD as Consulting Physician (Obstetrics and Gynecology) De Blanch, MD as Consulting Physician (Gynecology) Cleda Mccreedy, MD as Consulting Physician (Gynecologic Oncology)  Extended Emergency Contact Information Primary Emergency Contact: [facility] Address: [address]          [place], [place] [address] [place] of [place] Home Phone: [phone] Mobile Phone: [phone] Relation: Daughter  Code Status:  Goals of Care: Advanced Directive information    10/28/2022    3:49 PM  Advanced Directives  Does Patient Have a Medical Advance Directive? Yes  Type of Advance Directive Living will;Healthcare Power of Attorney  Does patient want to make changes to medical advance directive? Yes (Inpatient - patient defers changing a medical advance directive and declines information at this time)  Copy of Healthcare Power of Attorney in Chart? Yes - validated most recent copy scanned in chart (See row information)      No chief complaint on file.   HPI: Patient is a 89 y.o. female seen today for acute visit as daughter is concerned that pt may have fluid in her lungs. Daughter reports that pt is not alert as she was 2 days ago, states she does not seem to be in pain and comfortable.Pt is not eating much and requested to start boost plus for her mother. Family is waiting to be evaluated by hospice , they have appt tomorrow  Pt seen and examined in her room, she opens her eyes when called her name but then closes her eyes and sleeps. When asked if she is in in pain, she reported that she is alright. Denies chest pain, abdominal pain Answers questions by yes or no and by head  nodding    Past Medical History:  Diagnosis Date   Anemia associated with acute blood loss 04/03/2011   Arthritis    knees    Ascites    Blood transfusion    hx of 2011    Complication of anesthesia    Sodium drops per pt    Cystitis    DIARRHEA, ANTIBIOTIC ASSOCIATED 05/31/2009   Qualifier: Diagnosis of  By: Daiva Eves MD, Cornelius     GERD (gastroesophageal reflux disease)    H/O hiatal hernia    Hyperlipidemia    Hypertension    Hyponatremia    Neutropenia with fever (HCC) 05/18/2011   OSTEOMYELITIS, CHRONIC, LOWER LEG 04/23/2009   Qualifier: Diagnosis of  By: Ninetta Lights MD, Linus Mako    OSTEOPOROSIS 04/23/2009   Qualifier: Diagnosis of  By: Ninetta Lights MD, Jeffrey     Ovarian cancer (HCC) 01/25/2011   Pneumonia    hx of    Postmenopausal atrophic vaginitis    PROSTHETIC JOINT COMPLICATION 05/28/2009   Qualifier: Diagnosis of  By: Daiva Eves MD, Cornelius     Renal disorder    Decreased kidney function   Staph infection 2010   after knee replacement   Urinary frequency    Vulvitis    Past Surgical History:  Procedure Laterality Date   ABDOMINAL HYSTERECTOMY  04/01/2011   Procedure: HYSTERECTOMY ABDOMINAL;  Surgeon: Jeannette Corpus, MD;  Location: [facility];  Service: Gynecology;  Laterality: N/A;   APPENDECTOMY  JOINT REPLACEMENT     R knee in 2008, 5 operations on L knee   LAPAROTOMY  04/01/2011   Procedure: EXPLORATORY LAPAROTOMY;  Surgeon: Jeannette Corpus, MD;  Location: [facility];  Service: Gynecology;  Laterality: N/A;   OTHER SURGICAL HISTORY     hx of C section 1966   SALPINGOOPHORECTOMY  04/01/2011   Procedure: SALPINGO OOPHERECTOMY;  Surgeon: Jeannette Corpus, MD;  Location: [facility];  Service: Gynecology;  Laterality: Bilateral;   TONSILLECTOMY      reports that she quit smoking about 70 years ago. Her smoking use included cigarettes. She has never used smokeless tobacco. She reports that she does not currently use alcohol. She reports  that she does not use drugs. Social History   Socioeconomic History   Marital status: Widowed    Spouse name: Not on file   Number of children: Not on file   Years of education: Not on file   Highest education level: Not on file  Occupational History   Not on file  Tobacco Use   Smoking status: Former    Current packs/day: 0.00    Types: Cigarettes    Quit date: 02/11/1952    Years since quitting: 70.7   Smokeless tobacco: Never  Vaping Use   Vaping status: Never Used  Substance and Sexual Activity   Alcohol use: Not Currently   Drug use: Never   Sexual activity: Never  Other Topics Concern   Not on file  Social History Narrative   Not on file   Social Determinants of Health   Financial Resource Strain: Low Risk  (08/23/2021)   Overall Financial Resource Strain (CARDIA)    Difficulty of Paying Living Expenses: Not hard at all  Food Insecurity: No Food Insecurity (10/28/2022)   Hunger Vital Sign    Worried About Running Out of Food in the Last Year: Never true    Ran Out of Food in the Last Year: Never true  Transportation Needs: No Transportation Needs (10/28/2022)   PRAPARE - Administrator, Civil Service (Medical): No    Lack of Transportation (Non-Medical): No  Physical Activity: Insufficiently Active (08/23/2021)   Exercise Vital Sign    Days of Exercise per Week: 7 days    Minutes of Exercise per Session: 10 min  Stress: No Stress Concern Present (08/16/2020)   [employer] of Occupational Health - Occupational Stress Questionnaire    Feeling of Stress : Only a Jennifer  Social Connections: Socially Isolated (08/23/2021)   Social Connection and Isolation Panel [NHANES]    Frequency of Communication with Friends and Family: More than three times a week    Frequency of Social Gatherings with Friends and Family: Three times a week    Attends Religious Services: Never    Active Member of Clubs or Organizations: No    Attends Banker Meetings:  Never    Marital Status: Widowed  Intimate Partner Violence: Not At Risk (10/28/2022)   Humiliation, Afraid, Rape, and Kick questionnaire    Fear of Current or Ex-Partner: No    Emotionally Abused: No    Physically Abused: No    Sexually Abused: No    Functional Status Survey:    Family History  Problem Relation Age of Onset   Cancer Other        Bladder cancer   Heart attack Brother    Diabetes Brother     Health Maintenance  Topic Date Due   DTaP/Tdap/Td (5 -  Td or Tdap) 10/15/2020   INFLUENZA VACCINE  09/11/2022   COVID-19 Vaccine (6 - 2023-24 season) 10/12/2022   Medicare Annual Wellness (AWV)  10/01/2023   Pneumonia Vaccine 76+ Years old  Completed   DEXA SCAN  Completed   HPV VACCINES  Aged Out   Zoster Vaccines- Shingrix  Discontinued    Allergies  Allergen Reactions   Morphine And Codeine Anaphylaxis and Other (See Comments)    Given after knee replacement and code blue occurred   Morphine Sulfate Anaphylaxis   Penicillins Hives, Itching and Other (See Comments)    Syncope, also   Nsaids Other (See Comments)    Because of an abnormal kidney test result   Penicillin V Hives   Shellfish Allergy Nausea And Vomiting and Other (See Comments)    Pt has shellfish allergy only.  Has had IV contrast x 2 and did fine.   Shellfish-Derived Products Nausea And Vomiting    Outpatient Encounter Medications as of 11/03/2022  Medication Sig   acetaminophen (TYLENOL) 325 MG tablet Take 650 mg by mouth every 4 (four) hours as needed for moderate pain or fever.   ALPRAZolam (XANAX) 0.5 MG tablet Take 1 tablet (0.5 mg total) by mouth every 12 (twelve) hours.   atorvastatin (LIPITOR) 40 MG tablet Take 1 tablet (40 mg total) by mouth daily.   B Complex-C-Folic Acid (B-COMPLEX/FOLIC ACID/VITAMIN C PO) Take 1 tablet by mouth daily with breakfast.   bimatoprost (LUMIGAN) 0.03 % ophthalmic solution Place 1 drop into both eyes at bedtime.   Cholecalciferol (VITAMIN D3) 50 MCG (2000  UT) TABS Take 2,000 Units by mouth in the morning.   cloNIDine (CATAPRES) 0.1 MG tablet Take 0.1 mg by mouth daily as needed (hypertension).   docusate sodium (COLACE) 100 MG capsule Take 100 mg by mouth daily.   ferrous sulfate 325 (65 FE) MG EC tablet Take 325 mg by mouth 2 (two) times a week. Mon, Thrusday   furosemide (LASIX) 20 MG tablet Take 10 mg by mouth daily.   lactose free nutrition (BOOST PLUS) LIQD Take 1 Container by mouth 3 (three) times daily.   levothyroxine (SYNTHROID) 25 MCG tablet Take 25 mcg by mouth daily before breakfast.   Magnesium Oxide 400 MG CAPS Take 400 mg daily   metoprolol tartrate (LOPRESSOR) 25 MG tablet Take 25 mg by mouth 2 (two) times daily.   mirabegron ER (MYRBETRIQ) 25 MG TB24 tablet Take 25 mg by mouth daily.   nystatin (MYCOSTATIN/NYSTOP) powder Apply 1 application  topically 2 (two) times daily as needed (reason not listed).   OXYGEN Inhale 2 L into the lungs as needed (if spo2 < 90 %).   pantoprazole (PROTONIX) 40 MG tablet Take 40 mg by mouth daily.   polyethylene glycol (MIRALAX / GLYCOLAX) 17 g packet Take 17 g by mouth every other day.   polyvinyl alcohol (LIQUIFILM TEARS) 1.4 % ophthalmic solution Place 2 drops into both eyes in the morning, at noon, in the evening, and at bedtime.   potassium chloride (KLOR-CON) 10 MEQ tablet Take 10 mEq by mouth daily.   sodium chloride 1 g tablet Take 1 g by mouth 2 (two) times daily.   No facility-administered encounter medications on file as of 11/03/2022.    Review of Systems  Unable to perform ROS: Dementia  Respiratory:  Negative for cough.   Cardiovascular:  Negative for chest pain.  Gastrointestinal:  Negative for abdominal pain.    There were no vitals filed for this visit.  There is no height or weight on file to calculate BMI. Physical Exam Constitutional:      Comments: Pt is lethargic, opens her eyes when called her name but then goes back to sleep. Answers questions by 1 word answers     Cardiovascular:     Rate and Rhythm: Normal rate. Rhythm irregular.  Pulmonary:     Effort: No respiratory distress.     Breath sounds: No wheezing or rales.  Abdominal:     General: There is no distension.     Tenderness: There is no abdominal tenderness.  Musculoskeletal:        General: No swelling.     Labs reviewed: Basic Metabolic Panel: Recent Labs    08/31/22 1134 10/07/22 0000 10/28/22 1227 10/29/22 1004  NA 136  138 135* 132* 133*  K 3.9  4.3 4.1 4.4 4.0  CL 104  102 104 95* 101  CO2 24 24* 25 22  GLUCOSE 108*  105*  --  131* 108*  BUN 21  26* 15 17 15   CREATININE 1.17*  1.30* 1.0 1.18* 0.92  CALCIUM 8.7* 8.1* 8.7* 8.7*  MG  --   --  1.7  --    Liver Function Tests: Recent Labs    01/22/22 0925 08/31/22 1134 10/07/22 0000 10/28/22 1227  AST 26 27 17 30   ALT 16 19 14 22   ALKPHOS 68 83 80 79  BILITOT 0.6 0.7  --  0.5  PROT 7.1 7.2  --  6.7  ALBUMIN 3.5 3.4* 2.7* 2.4*   No results for input(s): "LIPASE", "AMYLASE" in the last 8760 hours. No results for input(s): "AMMONIA" in the last 8760 hours. CBC: Recent Labs    10/07/22 0000 10/09/22 0000 10/28/22 1227 10/29/22 0048 10/29/22 0528 10/30/22 0250  WBC 7.3 8.5 10.0 8.9 8.9 7.9  NEUTROABS 5,446.00 6,656.00 7.7  --   --   --   HGB 9.6* 11.0* 10.7* 9.6* 10.7* 10.8*  HCT 30* 34* 33.3* 29.7* 35.2* 34.6*  MCV  --   --  81.8 81.8 83.4 80.8  PLT 315 324 434* 409* 427* 412*   Cardiac Enzymes: No results for input(s): "CKTOTAL", "CKMB", "CKMBINDEX", "TROPONINI" in the last 8760 hours. BNP: Invalid input(s): "POCBNP" Lab Results  Component Value Date   HGBA1C 5.7 (H) 11/24/2021   Lab Results  Component Value Date   TSH 4.376 10/28/2022   Lab Results  Component Value Date   VITAMINB12 1,179 10/09/2022   No results found for: "FOLATE" Lab Results  Component Value Date   IRON 27 10/09/2022   TIBC 189 10/09/2022   FERRITIN 239 10/09/2022    Imaging and Procedures obtained prior to  SNF admission: ECHOCARDIOGRAM COMPLETE  Result Date: 10/29/2022    ECHOCARDIOGRAM REPORT   Patient Name:   [name] [facility] Date of Exam: 10/29/2022 Medical Rec #:  [id number]        Height:       64.0 in Accession #:    [id number]       Weight:       177.6 lb Date of Birth:  08-12-1929        BSA:          1.860 m Patient Age:    89 years         BP:           1150/72 mmHg Patient Gender: F                HR:  106 bpm. Exam Location:  Inpatient Procedure: 2D Echo, Color Doppler and Cardiac Doppler Indications:    Afib  History:        Patient has prior history of Echocardiogram examinations, most                 recent 01/04/2019. CKD and Stroke; Risk Factors:Dyslipidemia and                 Hypertension.  Sonographer:    Milbert Coulter Referring Phys: [id number] Orland Mustard  Sonographer Comments: Suboptimal subcostal window. Image acquisition challenging due to uncooperative patient. IMPRESSIONS  1. Left ventricular ejection fraction, by estimation, is 70 to 75%. The left ventricle has hyperdynamic function. The left ventricle has no regional wall motion abnormalities. There is moderate concentric left ventricular hypertrophy. Left ventricular diastolic parameters are consistent with Grade I diastolic dysfunction (impaired relaxation). Elevated left atrial pressure.  2. Right ventricular systolic function is normal. The right ventricular size is not well visualized. Tricuspid regurgitation signal is inadequate for assessing PA pressure.  3. Left atrial size was moderately dilated.  4. Right atrial size was mildly dilated.  5. Moderate pleural effusion in the left lateral region.  6. The mitral valve is normal in structure. Mild to moderate mitral valve regurgitation. Moderate mitral annular calcification.  7. The aortic valve is tricuspid. There is mild calcification of the aortic valve. There is moderate thickening of the aortic valve. Aortic valve regurgitation is mild to moderate. Aortic valve  sclerosis/calcification is present, without any evidence of  aortic stenosis.  8. There is borderline dilatation of the ascending aorta, measuring 38 mm. Comparison(s): No significant change from prior study. Prior images reviewed side by side. FINDINGS  Left Ventricle: Left ventricular ejection fraction, by estimation, is 70 to 75%. The left ventricle has hyperdynamic function. The left ventricle has no regional wall motion abnormalities. The left ventricular internal cavity size was normal in size. There is moderate concentric left ventricular hypertrophy. Left ventricular diastolic parameters are consistent with Grade I diastolic dysfunction (impaired relaxation). Elevated left atrial pressure. Right Ventricle: The right ventricular size is not well visualized. Right vetricular wall thickness was not well visualized. Right ventricular systolic function is normal. Tricuspid regurgitation signal is inadequate for assessing PA pressure. Left Atrium: Left atrial size was moderately dilated. Right Atrium: Right atrial size was mildly dilated. Pericardium: There is no evidence of pericardial effusion. Mitral Valve: The mitral valve is normal in structure. Moderate mitral annular calcification. Mild to moderate mitral valve regurgitation. Tricuspid Valve: The tricuspid valve is normal in structure. Tricuspid valve regurgitation is not demonstrated. Aortic Valve: The aortic valve is tricuspid. There is mild calcification of the aortic valve. There is moderate thickening of the aortic valve. Aortic valve regurgitation is mild to moderate. Aortic regurgitation PHT measures 441 msec. Aortic valve sclerosis/calcification is present, without any evidence of aortic stenosis. Aortic valve mean gradient measures 8.0 mmHg. Aortic valve peak gradient measures 14.9 mmHg. Aortic valve area, by VTI measures 1.83 cm. Pulmonic Valve: The pulmonic valve was grossly normal. Pulmonic valve regurgitation is trivial. No evidence of  pulmonic stenosis. Aorta: The aortic root is normal in size and structure. There is borderline dilatation of the ascending aorta, measuring 38 mm. IAS/Shunts: The interatrial septum was not well visualized. Additional Comments: There is a moderate pleural effusion in the left lateral region.  LEFT VENTRICLE PLAX 2D LVIDd:         3.80 cm   Diastology LVIDs:  2.20 cm   LV e' medial:    4.82 cm/s LV PW:         1.30 cm   LV E/e' medial:  17.8 LV IVS:        1.50 cm   LV e' lateral:   7.15 cm/s LVOT diam:     2.00 cm   LV E/e' lateral: 12.0 LV SV:         75 LV SV Index:   40 LVOT Area:     3.14 cm  RIGHT VENTRICLE RV Basal diam:  2.90 cm RV Mid diam:    2.50 cm RV S prime:     11.00 cm/s TAPSE (M-mode): 1.5 cm LEFT ATRIUM             Index        RIGHT ATRIUM           Index LA diam:        4.40 cm 2.37 cm/m   RA Area:     19.20 cm LA Vol (A2C):   85.0 ml 45.70 ml/m  RA Volume:   50.70 ml  27.26 ml/m LA Vol (A4C):   75.1 ml 40.38 ml/m LA Biplane Vol: 80.3 ml 43.17 ml/m  AORTIC VALVE AV Area (Vmax):    1.52 cm AV Area (Vmean):   1.63 cm AV Area (VTI):     1.83 cm AV Vmax:           193.00 cm/s AV Vmean:          132.000 cm/s AV VTI:            0.408 m AV Peak Grad:      14.9 mmHg AV Mean Grad:      8.0 mmHg LVOT Vmax:         93.40 cm/s LVOT Vmean:        68.300 cm/s LVOT VTI:          0.238 m LVOT/AV VTI ratio: 0.58 AI PHT:            441 msec  AORTA Ao Root diam: 2.90 cm Ao Asc diam:  3.80 cm MITRAL VALVE MV Area (PHT): 3.99 cm    SHUNTS MV Decel Time: 190 msec    Systemic VTI:  0.24 m MV E velocity: 85.70 cm/s  Systemic Diam: 2.00 cm MV A velocity: 95.70 cm/s MV E/A ratio:  0.90 Mihai Croitoru MD Electronically signed by Thurmon Fair MD Signature Date/Time: 10/29/2022/2:24:04 PM    Final    DG CHEST PORT 1 VIEW  Result Date: 10/28/2022 CLINICAL DATA:  Acute exacerbation of CHF.  Shortness of breath. EXAM: PORTABLE CHEST 1 VIEW COMPARISON:  01/22/2022 FINDINGS: Lung volumes are low. Grossly  stable heart size, left heart border partially obscured by pleural effusion and opacity. Aortic atherosclerosis. Opacification of the lower left hemithorax likely related to tracking pleural effusion and atelectasis. Slight vascular congestion. No pneumothorax. IMPRESSION: 1. Low lung volumes. Opacification of the lower left hemithorax likely related to tracking pleural effusion and atelectasis. 2. Slight vascular congestion. Electronically Signed   By: Narda Rutherford M.D.   On: 10/28/2022 16:34    Assessment/Plan   Rectal bleeding  Pt had recent hospitalization for rectal bleeding No further episodes since then     Atrial flutter  Heart rate stable  Cont with metoprolol  Hypoxia Pt is lethargic  Pt agreed for hospice , waiting to meet hospice nurse  Pt on supplemental o2  Does not appear  to be in distress Cont with lasix   Family/ staff Communication: care plan discussed with the nurse  Labs/tests ordered: none

## 2022-11-11 DEATH — deceased

## 2022-11-12 ENCOUNTER — Ambulatory Visit (HOSPITAL_COMMUNITY): Payer: Medicare Other

## 2022-12-01 ENCOUNTER — Ambulatory Visit: Payer: Medicare Other | Admitting: Nurse Practitioner

## 2023-03-05 ENCOUNTER — Ambulatory Visit: Payer: Medicare Other | Admitting: Cardiology
